# Patient Record
Sex: Female | Born: 1950 | Race: White | Hispanic: No | Marital: Married | Smoking: Never smoker
Health system: Southern US, Community
[De-identification: ages and names within clinical notes are randomized; demographics above are authoritative.]

## PROBLEM LIST (undated history)

## (undated) DIAGNOSIS — Z9889 Other specified postprocedural states: Secondary | ICD-10-CM

## (undated) DIAGNOSIS — I890 Lymphedema, not elsewhere classified: Secondary | ICD-10-CM

## (undated) DIAGNOSIS — E119 Type 2 diabetes mellitus without complications: Secondary | ICD-10-CM

## (undated) DIAGNOSIS — I639 Cerebral infarction, unspecified: Secondary | ICD-10-CM

## (undated) DIAGNOSIS — R112 Nausea with vomiting, unspecified: Secondary | ICD-10-CM

## (undated) DIAGNOSIS — L409 Psoriasis, unspecified: Secondary | ICD-10-CM

## (undated) DIAGNOSIS — I8390 Asymptomatic varicose veins of unspecified lower extremity: Secondary | ICD-10-CM

## (undated) DIAGNOSIS — C50911 Malignant neoplasm of unspecified site of right female breast: Secondary | ICD-10-CM

## (undated) DIAGNOSIS — Z923 Personal history of irradiation: Secondary | ICD-10-CM

## (undated) DIAGNOSIS — C801 Malignant (primary) neoplasm, unspecified: Secondary | ICD-10-CM

## (undated) DIAGNOSIS — I10 Essential (primary) hypertension: Secondary | ICD-10-CM

## (undated) HISTORY — DX: Lymphedema, not elsewhere classified: I89.0

## (undated) HISTORY — DX: Cerebral infarction, unspecified: I63.9

## (undated) HISTORY — PX: REDUCTION MAMMAPLASTY: SUR839

## (undated) HISTORY — DX: Malignant neoplasm of unspecified site of right female breast: C50.911

## (undated) HISTORY — DX: Type 2 diabetes mellitus without complications: E11.9

## (undated) HISTORY — DX: Asymptomatic varicose veins of unspecified lower extremity: I83.90

## (undated) HISTORY — DX: Malignant (primary) neoplasm, unspecified: C80.1

---

## 1975-11-09 HISTORY — PX: BREAST REDUCTION SURGERY: SHX8

## 1988-11-08 DIAGNOSIS — L409 Psoriasis, unspecified: Secondary | ICD-10-CM

## 1988-11-08 HISTORY — DX: Psoriasis, unspecified: L40.9

## 1998-11-08 DIAGNOSIS — I639 Cerebral infarction, unspecified: Secondary | ICD-10-CM

## 1998-11-08 HISTORY — DX: Cerebral infarction, unspecified: I63.9

## 2004-09-16 ENCOUNTER — Ambulatory Visit: Payer: Self-pay | Admitting: Family Medicine

## 2005-08-31 ENCOUNTER — Ambulatory Visit: Payer: Self-pay | Admitting: Nurse Practitioner

## 2006-09-26 ENCOUNTER — Ambulatory Visit: Payer: Self-pay | Admitting: Nurse Practitioner

## 2007-12-14 ENCOUNTER — Ambulatory Visit: Payer: Self-pay | Admitting: Family Medicine

## 2008-11-08 HISTORY — PX: DILATION AND CURETTAGE OF UTERUS: SHX78

## 2009-10-09 ENCOUNTER — Emergency Department: Payer: Self-pay | Admitting: Emergency Medicine

## 2010-04-28 ENCOUNTER — Ambulatory Visit: Payer: Self-pay | Admitting: Family Medicine

## 2011-11-03 ENCOUNTER — Ambulatory Visit: Payer: Self-pay | Admitting: Family Medicine

## 2011-11-03 IMAGING — MG MM DIGITAL SCREENING BILAT W/ CAD
1 series · 4 of 4 positions shown · non-contrast
Comparison: none

REASON FOR EXAM: HATTEN scr mammo no order
COMMENTS:

[R CC · right · 4 of 4 slices shown]
[im 1/4]
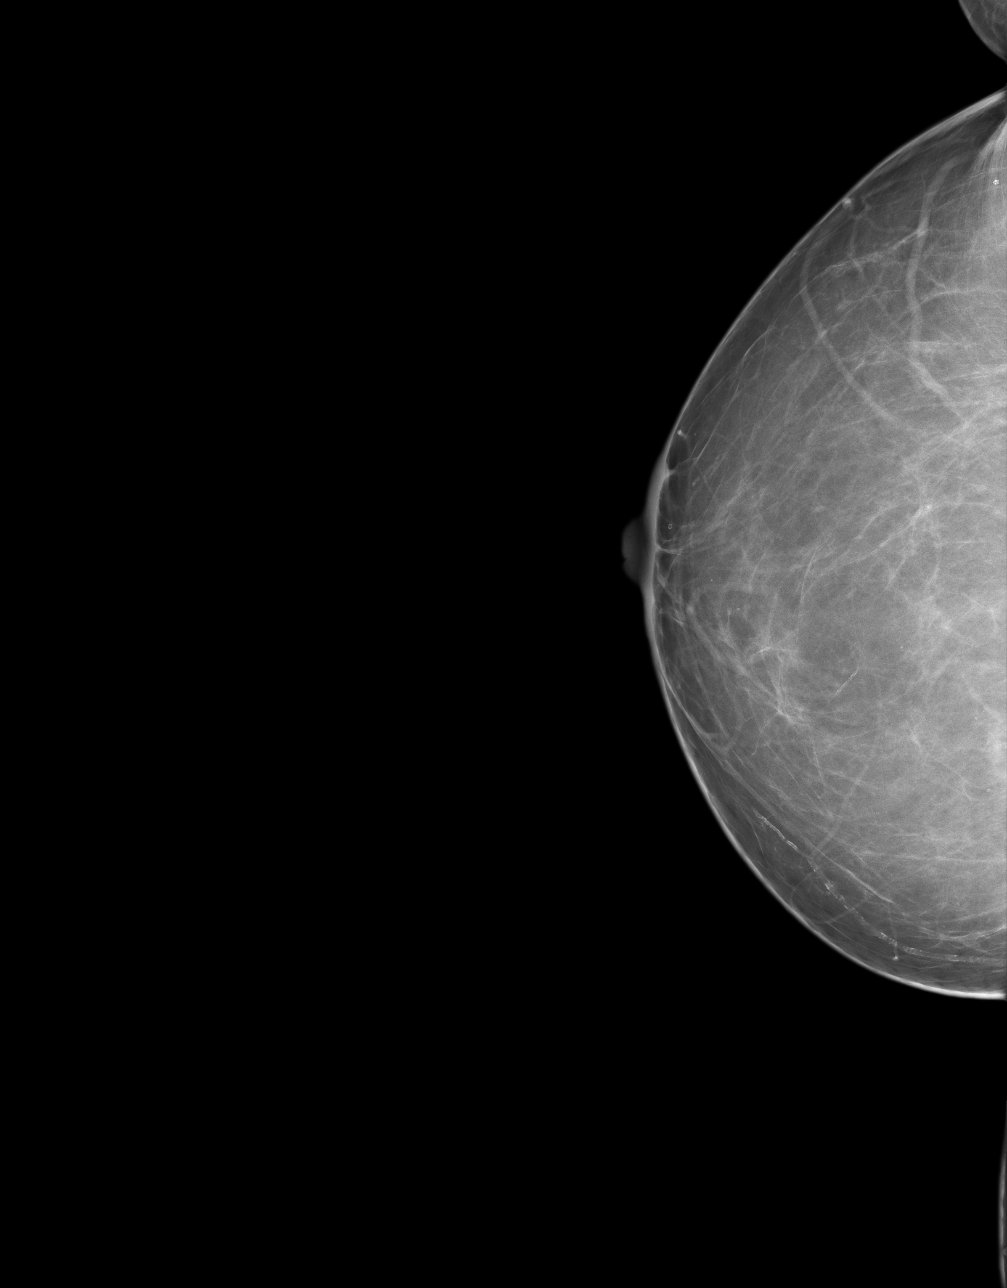
[im 2/4]
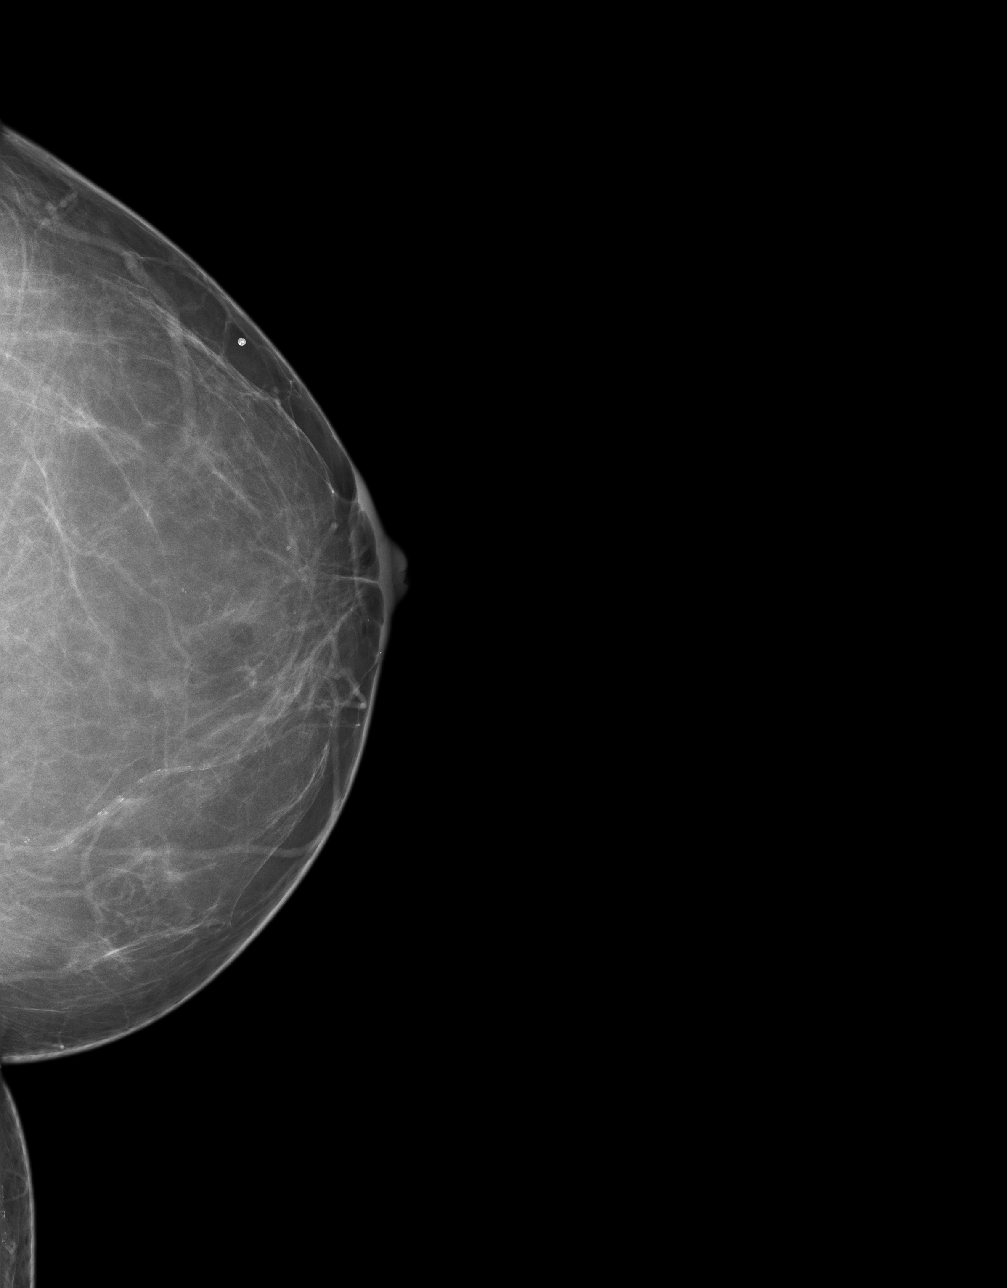
[im 3/4]
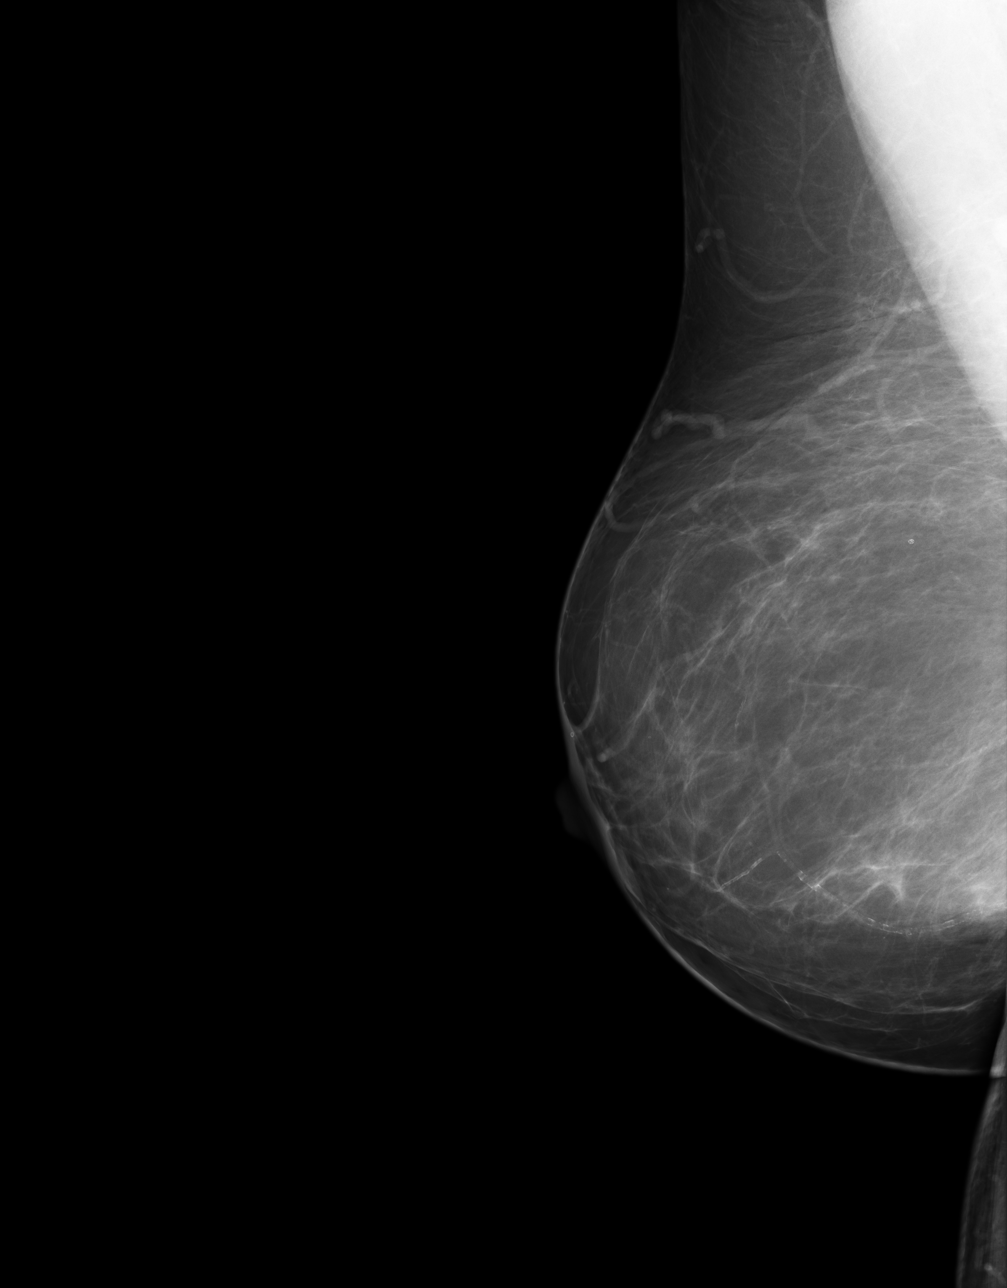
[im 4/4]
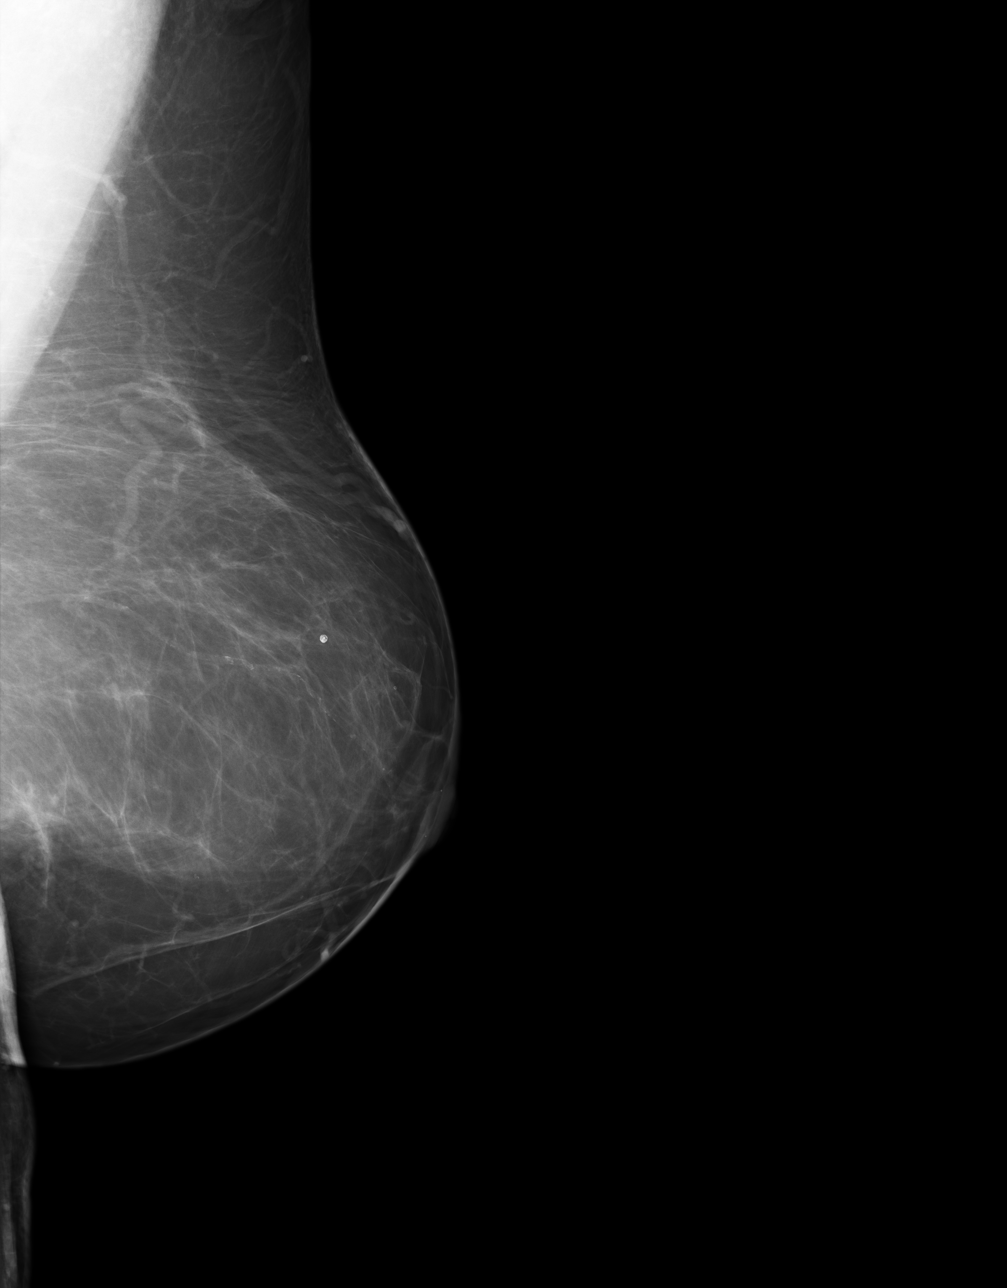

[4 of 4 positions shown; findings below may reference images not displayed]

PROCEDURE:     MAM - MAM HATTEN DGT SCR W/CAD NO ORDER  - [DATE]  [DATE]

RESULT:       Comparison is made to a study of [DATE], [DATE] and
[DATE]. A film screen study of [DATE] is reviewed as well.

The breasts exhibit an involutional pattern with only a small amount of
residual dense parenchymal tissue.  There is no dominant mass.  There are
vascular calcifications bilaterally and a coarse macrocalcification is
stable on the left in the upper outer quadrant.  I see no
malignant-appearing grouping of microcalcification and no area of new
architectural distortion.
IMPRESSION: 1.     I do not see findings suspicious for malignancy.
2.     BI-RADS 2:  Benign Findings.

RECOMMENTATION:   Please continue to encourage yearly mammographic follow
up.

A NEGATIVE MAMMOGRAM REPORT DOES NOT PRECLUDE BIOPSY OR OTHER EVALUATION OF
A CLINICALLY PALPABLE OR OTHERWISE SUSPICOUS MASS OR LESION.  BREAST CANCER
MAY NOT BE DETECTED BY MAMMOGRAPHY IN UP TO 10% OF CASES.

## 2011-11-09 HISTORY — PX: EYE SURGERY: SHX253

## 2012-01-05 ENCOUNTER — Emergency Department: Payer: Self-pay | Admitting: Unknown Physician Specialty

## 2012-01-05 LAB — CBC
HCT: 42.8 % (ref 35.0–47.0)
HGB: 14.1 g/dL (ref 12.0–16.0)
MCH: 28.9 pg (ref 26.0–34.0)
MCV: 88 fL (ref 80–100)
RBC: 4.9 10*6/uL (ref 3.80–5.20)
WBC: 10.1 10*3/uL (ref 3.6–11.0)

## 2012-01-05 LAB — COMPREHENSIVE METABOLIC PANEL
Albumin: 3.6 g/dL (ref 3.4–5.0)
Anion Gap: 6 — ABNORMAL LOW (ref 7–16)
BUN: 22 mg/dL — ABNORMAL HIGH (ref 7–18)
Chloride: 99 mmol/L (ref 98–107)
EGFR (African American): >60
EGFR (Non-African Amer.): >60
Glucose: 172 mg/dL — ABNORMAL HIGH (ref 65–99)
SGOT(AST): 13 U/L — ABNORMAL LOW (ref 15–37)
SGPT (ALT): 18 U/L
Sodium: 138 mmol/L (ref 136–145)
Total Protein: 7.9 g/dL (ref 6.4–8.2)

## 2012-01-05 LAB — URINALYSIS, COMPLETE
Bilirubin,UR: NEGATIVE
Glucose,UR: 150 mg/dL (ref 0–75)
Ketone: NEGATIVE
Leukocyte Esterase: NEGATIVE
Nitrite: POSITIVE
Ph: 7 (ref 4.5–8.0)
Protein: 100
RBC,UR: 3 /HPF (ref 0–5)
WBC UR: 8 /HPF (ref 0–5)

## 2012-01-05 LAB — TROPONIN I: Troponin-I: <0.02 ng/mL

## 2012-01-05 LAB — LIPASE, BLOOD: Lipase: 90 U/L (ref 73–393)

## 2012-05-30 ENCOUNTER — Ambulatory Visit: Payer: Self-pay | Admitting: Ophthalmology

## 2012-06-13 ENCOUNTER — Ambulatory Visit: Payer: Self-pay | Admitting: Family Medicine

## 2012-06-13 IMAGING — CR DG FOOT COMPLETE 3+V*L*
1 series · 4 of 4 positions shown · non-contrast
Comparison: none

REASON FOR EXAM: Diabetic foot ulcer
COMMENTS:

PROCEDURE:     DXR - DXR FOOT LT COMP W/OBLIQUES  - [DATE] [DATE]
RESULT:     Comparison: None.

[Series 1: ap · 0.17mm/px · 4 of 4 slices shown]
[im 1/4]
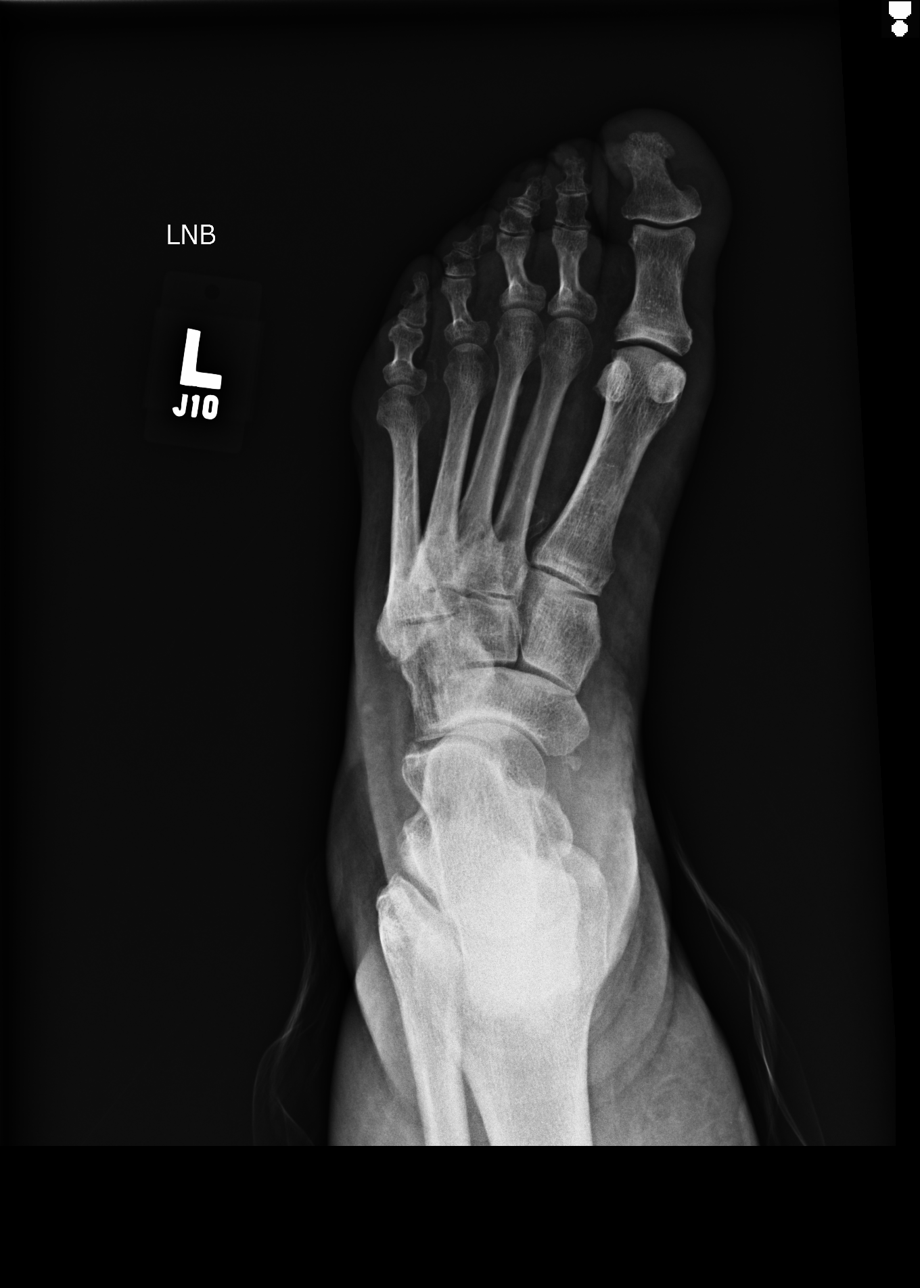
[im 2/4]
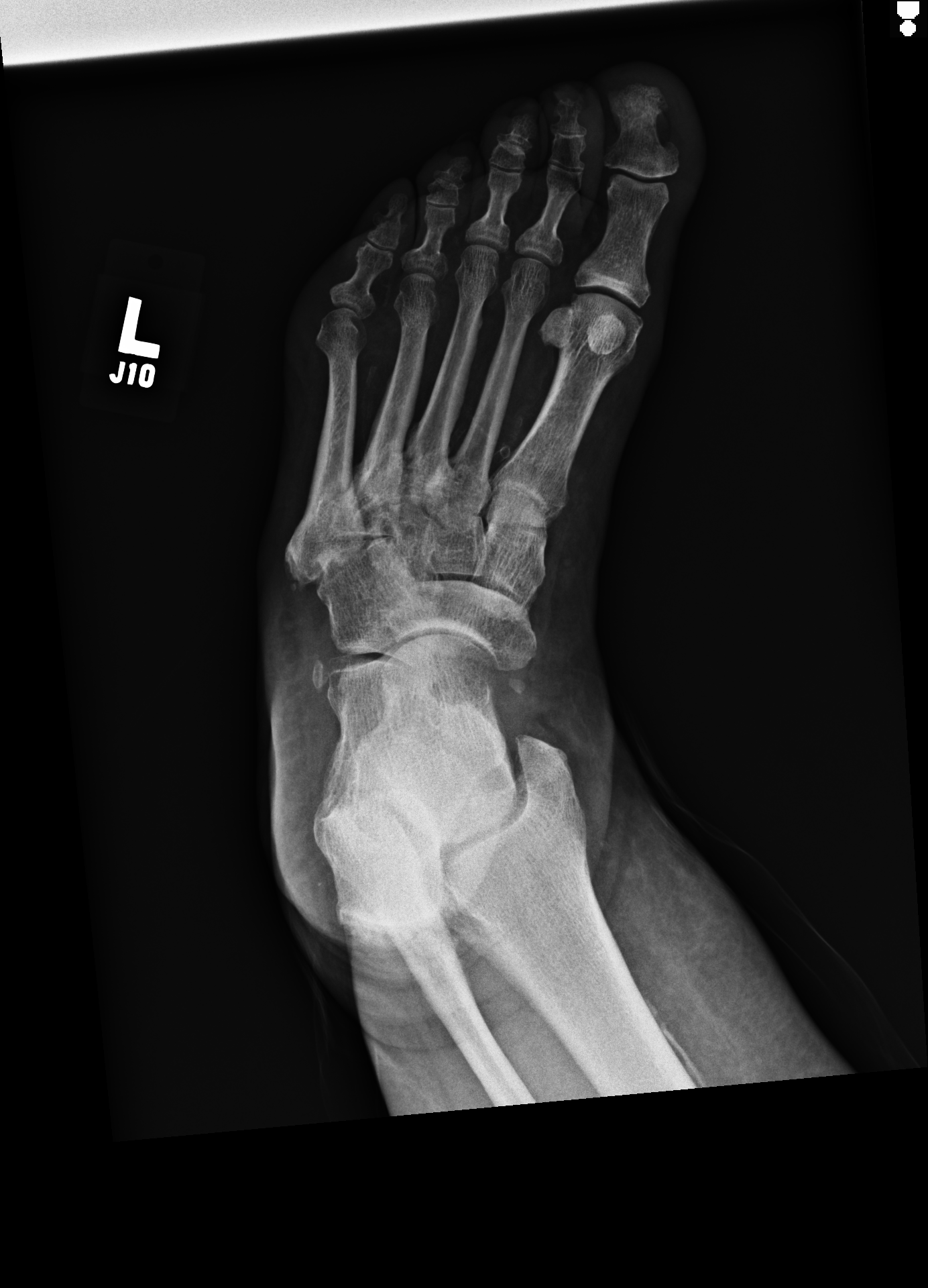
[im 3/4]
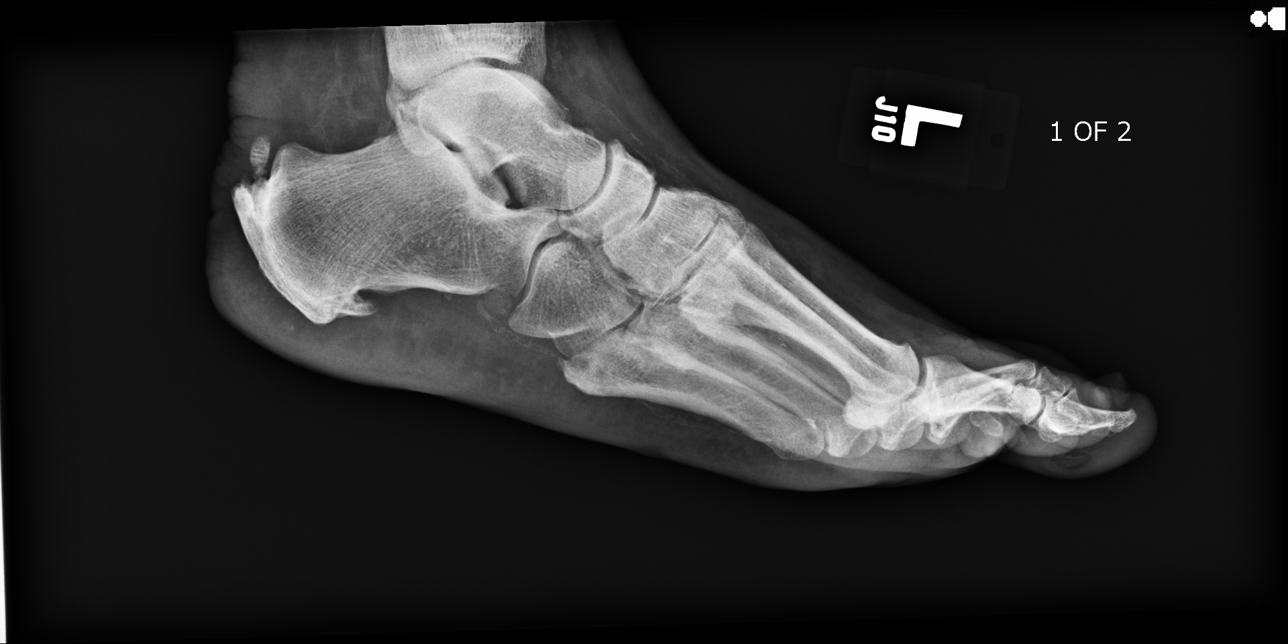
[im 4/4]
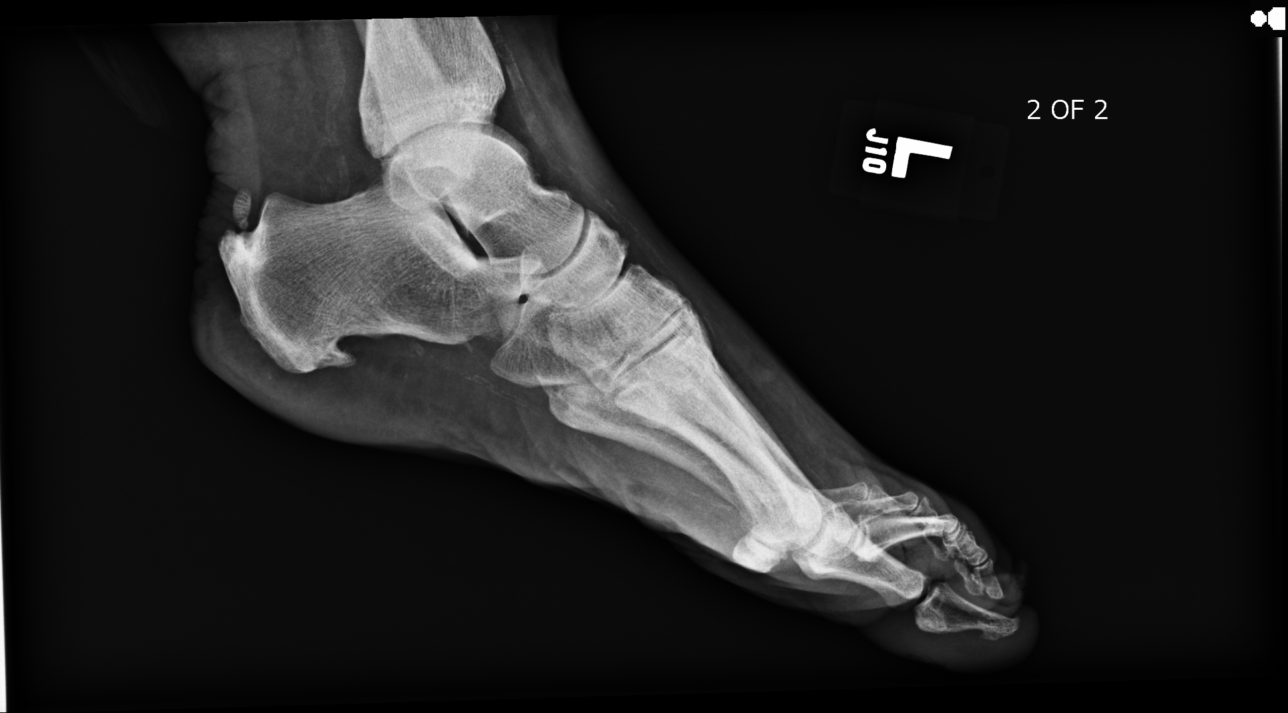

[4 of 4 positions shown; findings below may reference images not displayed]

FINDINGS: No acute fracture. No periostitis or cortical thickening. There are mild
degenerative changes in the midfoot. Vascular calcifications are present.
There is a small density with surrounding lucency along the plantar soft
tissues of the great toe which is of uncertain etiology.
IMPRESSION: 1. Small soft tissue density with surrounding lucency in the plantar soft
tissues of the great toe is of uncertain etiology. This may be related to a
toe ulcer. Correlate for foreign body.
2. No radiographic evidence of osteomyelitis. If there is continued clinical
concern, further evaluation could be provided with MRI.

[REDACTED]

## 2012-06-19 ENCOUNTER — Encounter: Payer: Self-pay | Admitting: Nurse Practitioner

## 2012-06-19 ENCOUNTER — Encounter: Payer: Self-pay | Admitting: Cardiothoracic Surgery

## 2012-07-09 ENCOUNTER — Encounter: Payer: Self-pay | Admitting: Cardiothoracic Surgery

## 2012-07-09 ENCOUNTER — Encounter: Payer: Self-pay | Admitting: Nurse Practitioner

## 2012-08-08 ENCOUNTER — Encounter: Payer: Self-pay | Admitting: Nurse Practitioner

## 2012-08-08 ENCOUNTER — Encounter: Payer: Self-pay | Admitting: Cardiothoracic Surgery

## 2012-09-08 ENCOUNTER — Encounter: Payer: Self-pay | Admitting: Cardiothoracic Surgery

## 2012-09-08 ENCOUNTER — Encounter: Payer: Self-pay | Admitting: Nurse Practitioner

## 2012-10-08 ENCOUNTER — Encounter: Payer: Self-pay | Admitting: Cardiothoracic Surgery

## 2012-10-08 ENCOUNTER — Encounter: Payer: Self-pay | Admitting: Nurse Practitioner

## 2012-11-08 ENCOUNTER — Encounter: Payer: Self-pay | Admitting: Cardiothoracic Surgery

## 2012-11-08 ENCOUNTER — Encounter: Payer: Self-pay | Admitting: Nurse Practitioner

## 2013-01-18 ENCOUNTER — Ambulatory Visit: Payer: Self-pay | Admitting: Family Medicine

## 2013-01-18 IMAGING — MG MM CAD SCREENING MAMMO
1 series · 6 of 6 positions shown · non-contrast
Comparison: none

REASON FOR EXAM: scr mammo no order
COMMENTS:

[R CC · right · 6 of 6 slices shown]
[im 1/6]
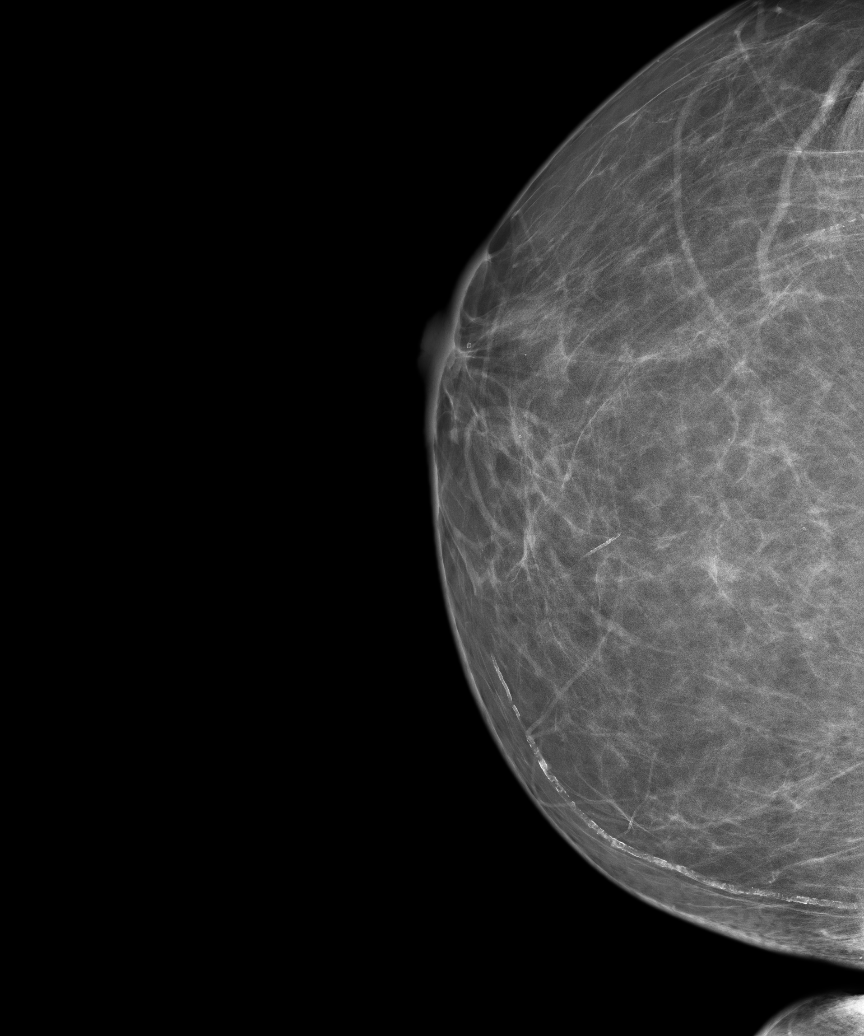
[im 2/6]
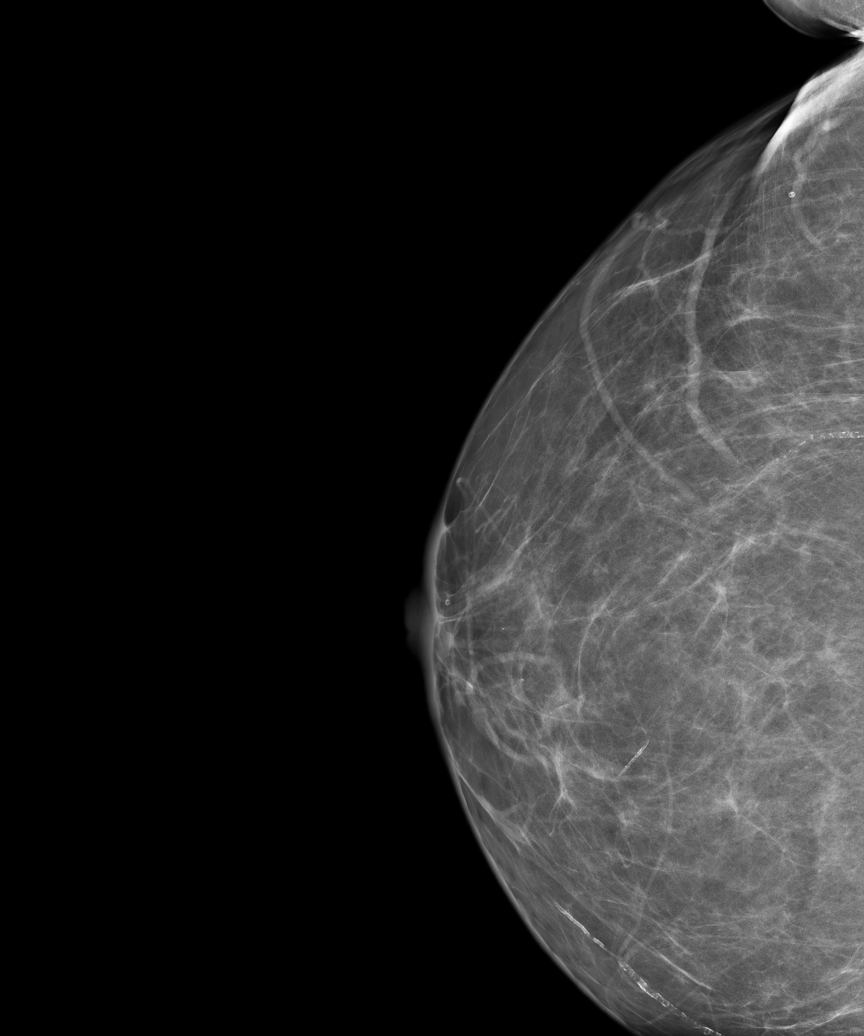
[im 3/6]
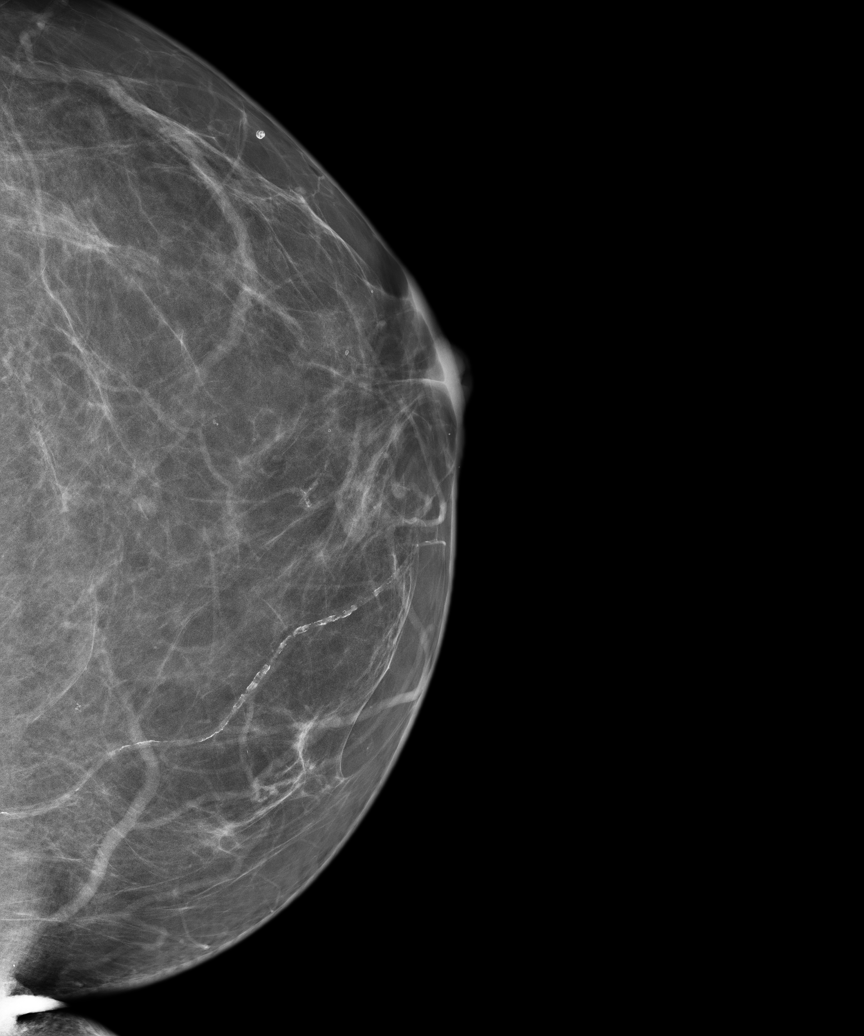
[im 4/6]
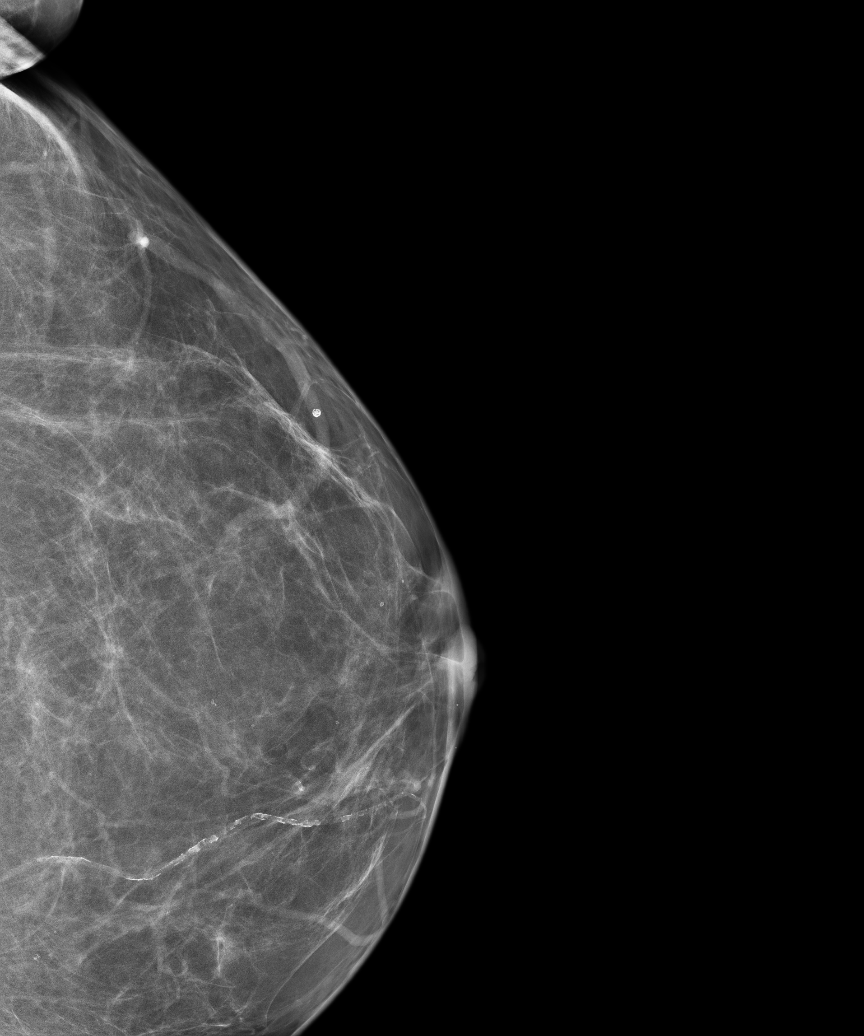
[im 5/6]
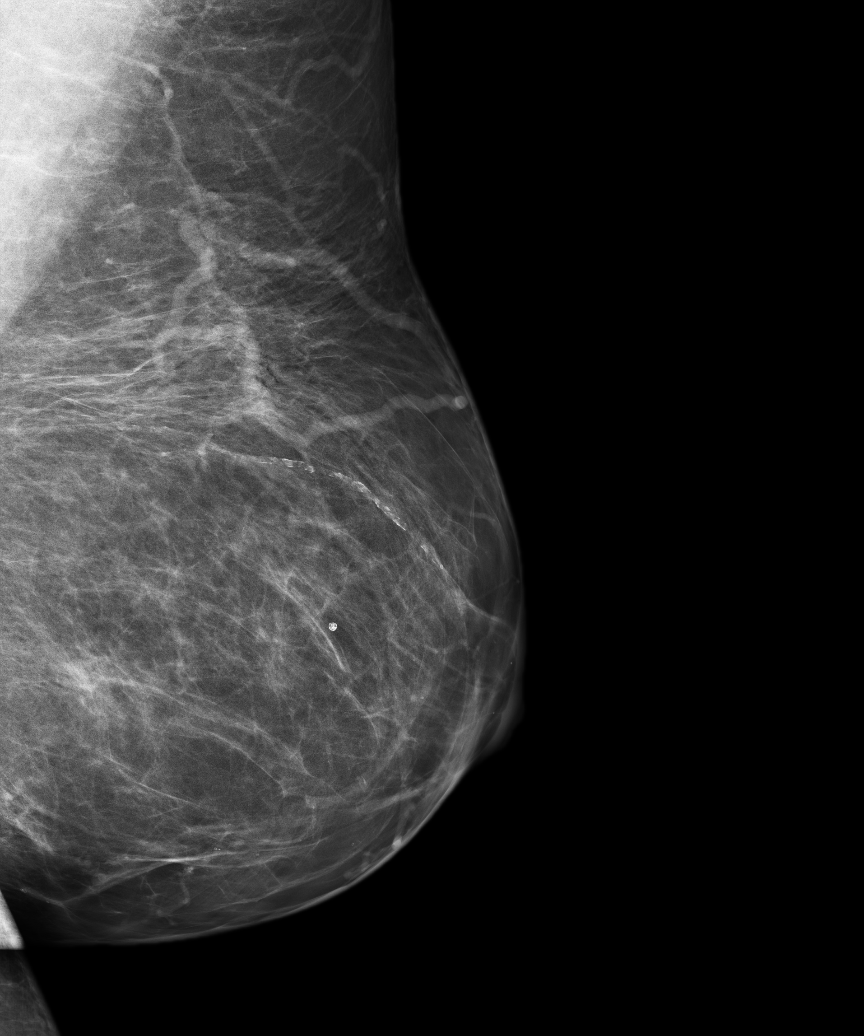
[im 6/6]
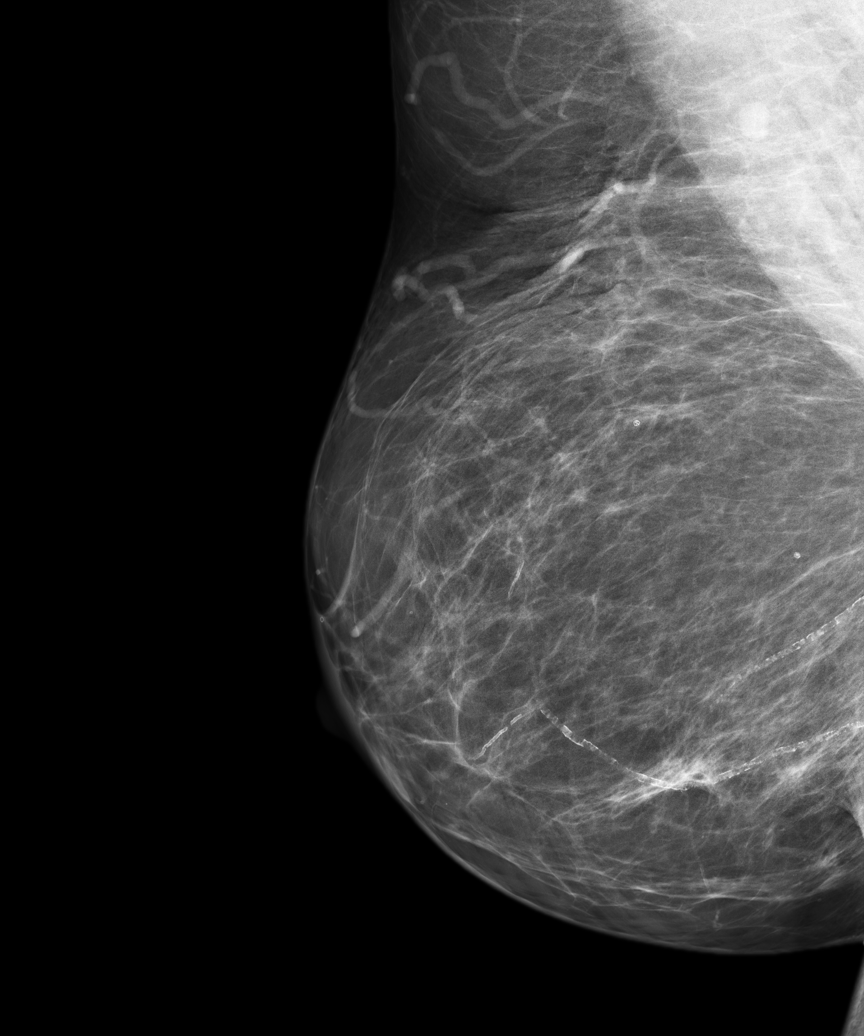

[6 of 6 positions shown; findings below may reference images not displayed]

PROCEDURE:     MAM - MAM DGTL SCRN MAM NO ORDER W/CAD  - [DATE]  [DATE]

RESULT:     There is a history of breast cancer in the patient's mother and
sister. The patient denies previous breast surgery or breast cancer history.
Comparison is made to digital images from [DATE] as well as [DATE] and [REDACTED]. There is scattered fibroglandular density
in both breasts without an area of increasing or dominant mass or developing
density. Stable benign appearing calcifications are present.
IMPRESSION: Stable, benign appearing bilateral mammogram. Please
continue to encourage annual mammographic followup and monthly breast self
exam. BREAST COMPOSITION: The breast composition is SCATTERED FIBROGLANDULAR
TISSUE (glandular tissue is 25-50%) BI-RADS: Category 2- Benign Finding

A NEGATIVE MAMMOGRAM REPORT DOES NOT PRECLUDE BIOPSY OR OTHER EVALUATION OF
A CLINICALLY PALPABLE OR OTHERWISE SUSPICIOUS MASS OR LESION. BREAST CANCER
MAY NOT BE DETECTED IN UP TO 10% OF CASES.

[REDACTED]

## 2013-02-19 DIAGNOSIS — J45909 Unspecified asthma, uncomplicated: Secondary | ICD-10-CM | POA: Insufficient documentation

## 2013-02-19 DIAGNOSIS — I639 Cerebral infarction, unspecified: Secondary | ICD-10-CM | POA: Insufficient documentation

## 2013-02-19 DIAGNOSIS — K5730 Diverticulosis of large intestine without perforation or abscess without bleeding: Secondary | ICD-10-CM | POA: Insufficient documentation

## 2013-02-19 DIAGNOSIS — K219 Gastro-esophageal reflux disease without esophagitis: Secondary | ICD-10-CM | POA: Insufficient documentation

## 2013-02-19 DIAGNOSIS — F32A Depression, unspecified: Secondary | ICD-10-CM | POA: Insufficient documentation

## 2013-02-19 DIAGNOSIS — E1169 Type 2 diabetes mellitus with other specified complication: Secondary | ICD-10-CM | POA: Insufficient documentation

## 2013-02-19 DIAGNOSIS — E669 Obesity, unspecified: Secondary | ICD-10-CM | POA: Insufficient documentation

## 2013-02-19 DIAGNOSIS — E785 Hyperlipidemia, unspecified: Secondary | ICD-10-CM | POA: Insufficient documentation

## 2013-02-19 DIAGNOSIS — I10 Essential (primary) hypertension: Secondary | ICD-10-CM | POA: Insufficient documentation

## 2013-02-19 DIAGNOSIS — F329 Major depressive disorder, single episode, unspecified: Secondary | ICD-10-CM | POA: Insufficient documentation

## 2013-02-26 ENCOUNTER — Ambulatory Visit: Payer: Self-pay | Admitting: Ophthalmology

## 2013-02-27 ENCOUNTER — Ambulatory Visit: Payer: Self-pay | Admitting: Ophthalmology

## 2013-02-27 LAB — BASIC METABOLIC PANEL
Anion Gap: 9 (ref 7–16)
Chloride: 109 mmol/L — ABNORMAL HIGH (ref 98–107)
Creatinine: 0.97 mg/dL (ref 0.60–1.30)
EGFR (African American): >60
EGFR (Non-African Amer.): >60

## 2013-02-28 ENCOUNTER — Ambulatory Visit: Payer: Self-pay | Admitting: Ophthalmology

## 2013-06-19 ENCOUNTER — Ambulatory Visit: Payer: Self-pay | Admitting: Ophthalmology

## 2013-06-19 LAB — POTASSIUM: Potassium: 4.4 mmol/L (ref 3.5–5.1)

## 2013-06-27 ENCOUNTER — Ambulatory Visit: Payer: Self-pay | Admitting: Ophthalmology

## 2013-11-08 DIAGNOSIS — Z923 Personal history of irradiation: Secondary | ICD-10-CM

## 2013-11-08 HISTORY — PX: EYE SURGERY: SHX253

## 2013-11-08 HISTORY — PX: BREAST EXCISIONAL BIOPSY: SUR124

## 2013-11-08 HISTORY — DX: Personal history of irradiation: Z92.3

## 2013-12-03 IMAGING — US US ABDOMEN LIMITED
1 series · 14 of 25 positions shown · non-contrast
Comparison: None.

CLINICAL DATA: Morbid obesity.

EXAM:
US ABDOMEN LIMITED - RIGHT UPPER QUADRANT

[Series 1: us abdomen limited · 0.25mm/px · 14 of 49 slices shown]
[im 1/49]
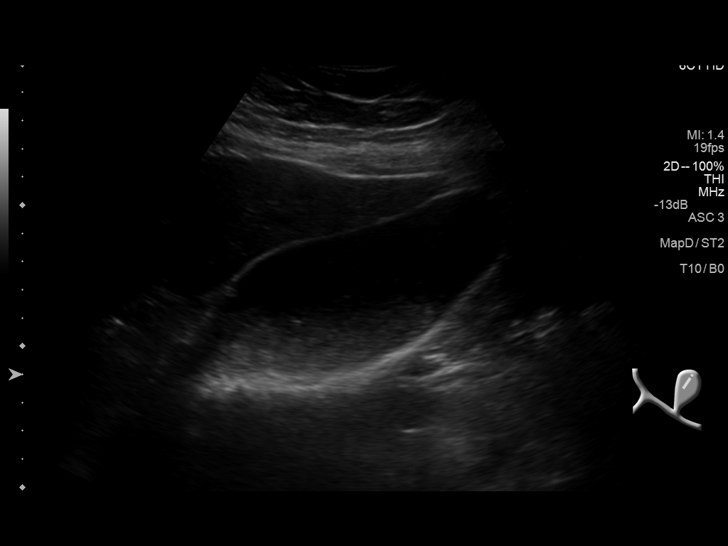
[im 5/49]
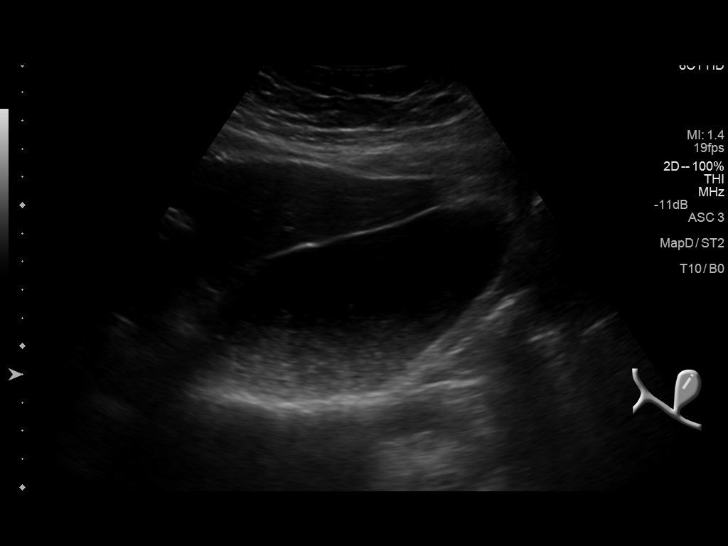
[im 9/49]
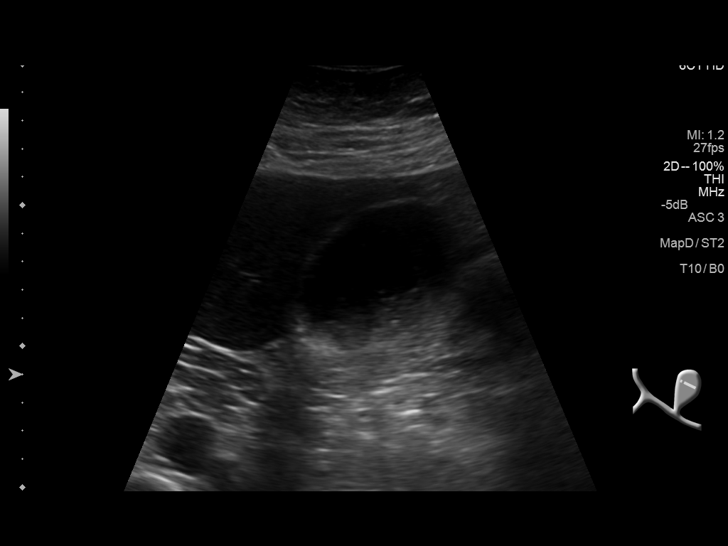
[im 13/49]
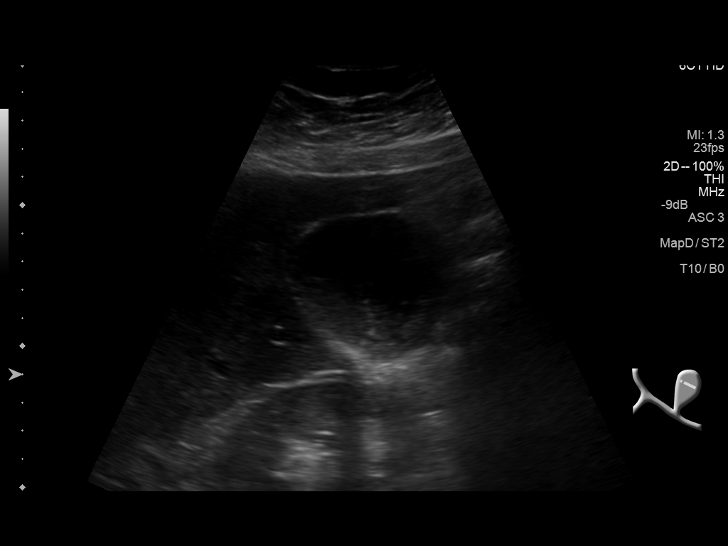
[im 17/49]
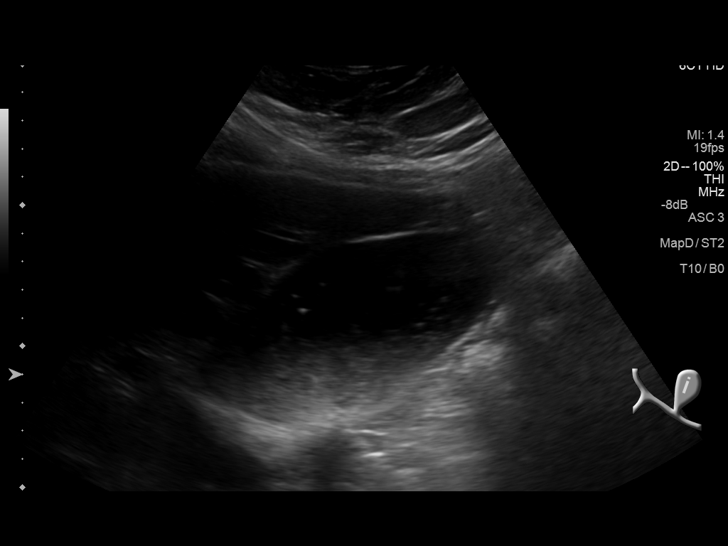
[im 19/49]
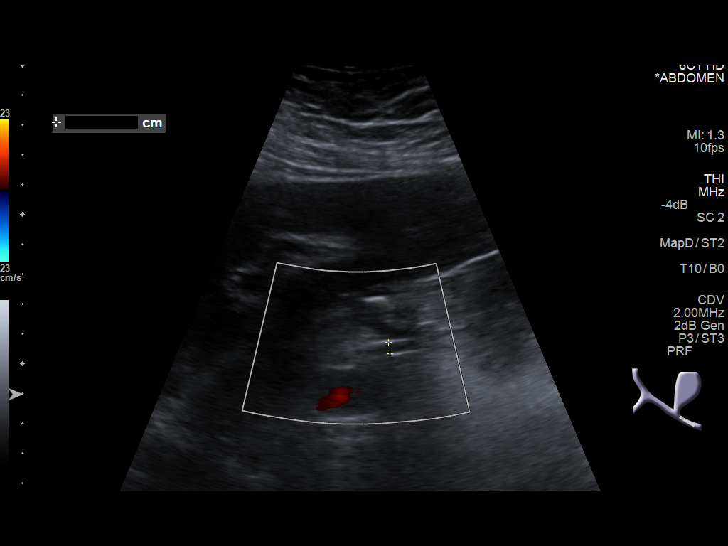
[im 23/49]
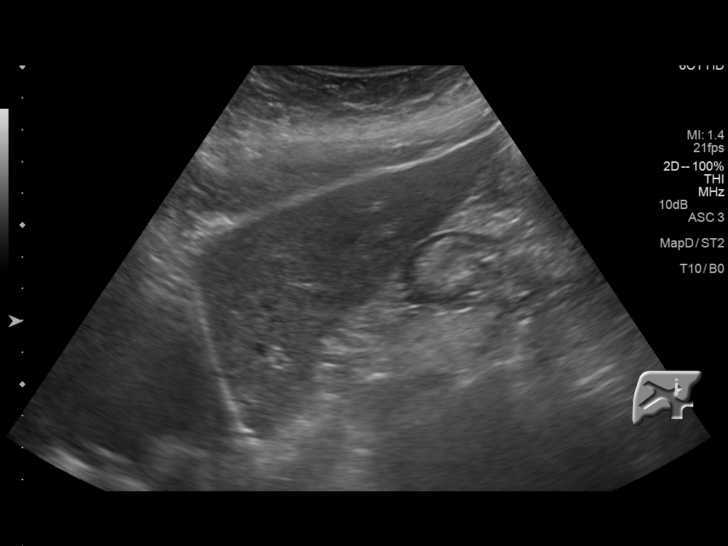
[im 27/49]
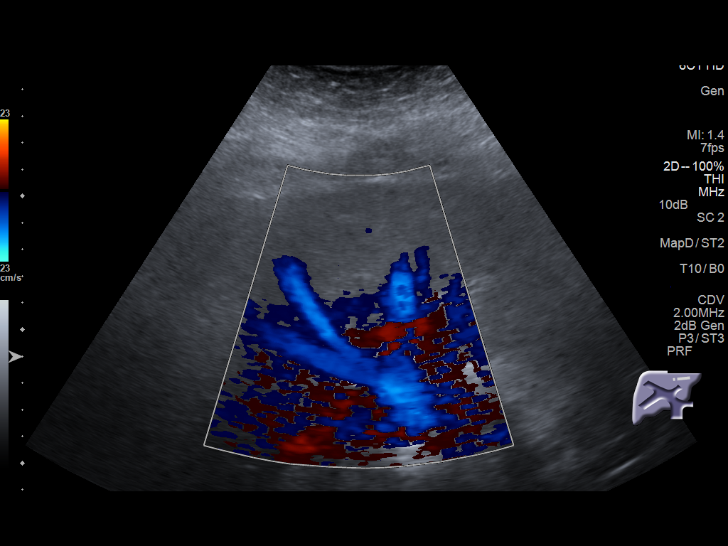
[im 31/49]
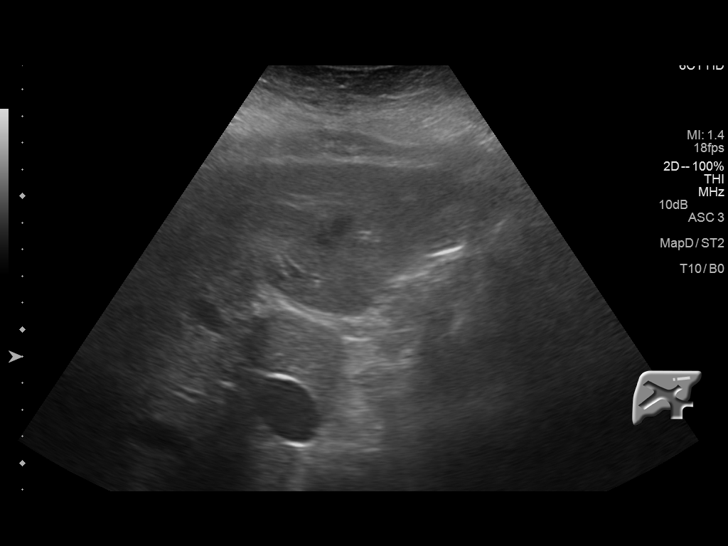
[im 33/49]
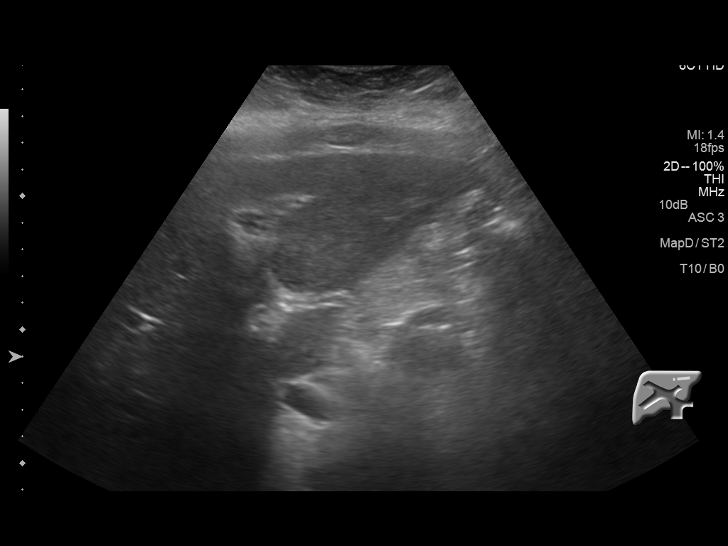
[im 37/49]
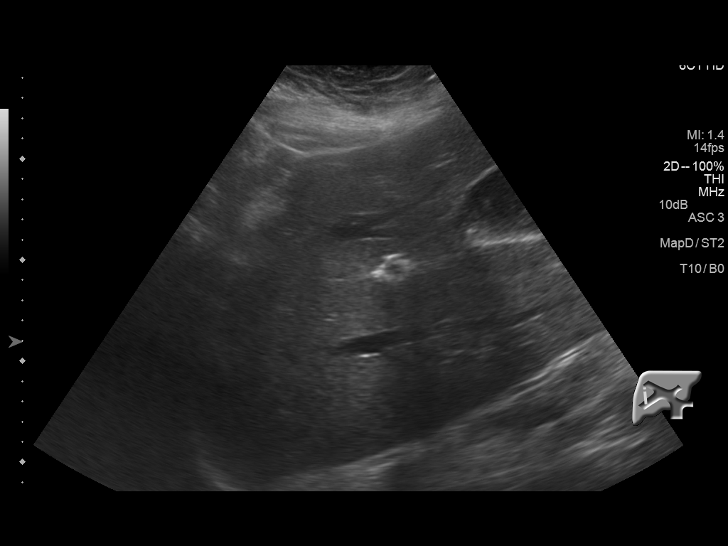
[im 41/49]
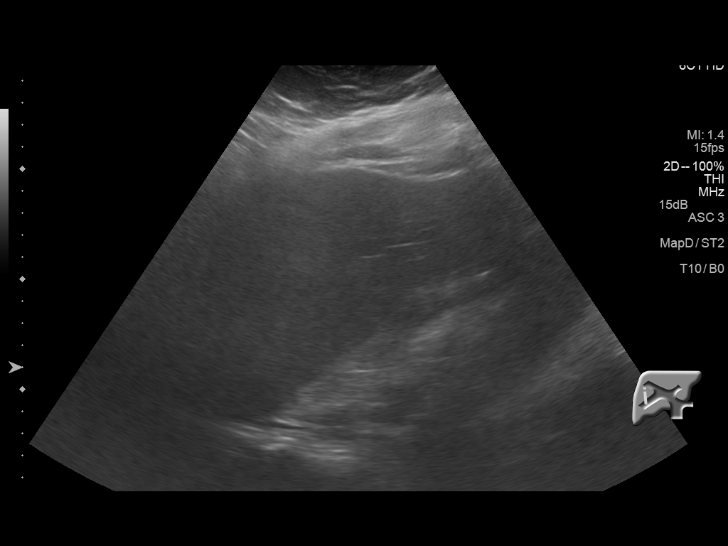
[im 45/49]
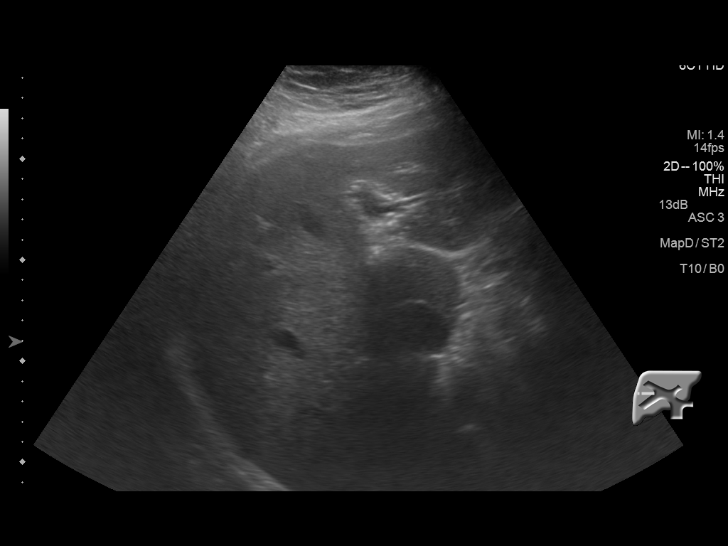
[im 49/49]
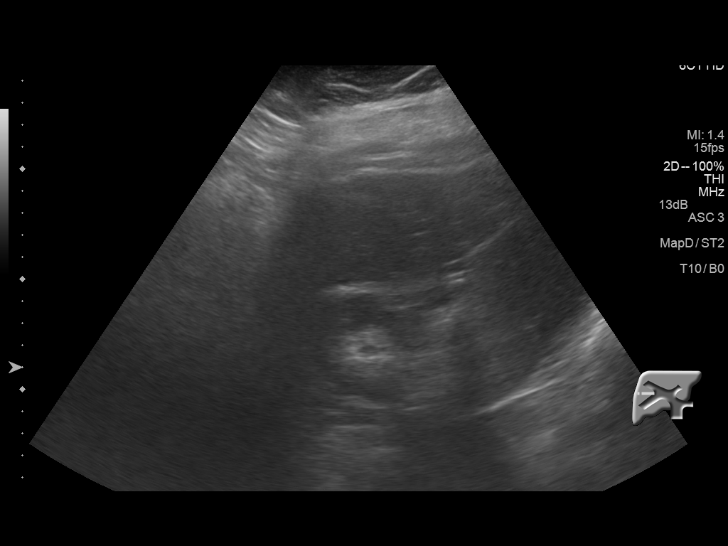

[14 of 25 positions shown; findings below may reference images not displayed]

FINDINGS: Gallbladder:

Prominent sludge noted in the gallbladder. Gallbladder wall
thickness normal at 1.9 mm. Negative Murphy sign.

Common bile duct:

Diameter: 3.8 mm.  Limited visualization due to body habitus.

Liver:

Liver is dense suggesting fatty infiltration.
IMPRESSION: 1. Sludge in the gallbladder.
2. Liver is dense suggesting fatty infiltration.

## 2014-01-04 ENCOUNTER — Emergency Department: Payer: Self-pay | Admitting: Emergency Medicine

## 2014-01-04 LAB — CBC
HCT: 38.6 % (ref 35.0–47.0)
HGB: 12.4 g/dL (ref 12.0–16.0)
MCH: 26.7 pg (ref 26.0–34.0)
MCHC: 32.1 g/dL (ref 32.0–36.0)
MCV: 83 fL (ref 80–100)
PLATELETS: 249 10*3/uL (ref 150–440)
RBC: 4.63 10*6/uL (ref 3.80–5.20)
RDW: 14.3 % (ref 11.5–14.5)
WBC: 10.2 10*3/uL (ref 3.6–11.0)

## 2014-01-04 LAB — BASIC METABOLIC PANEL
Anion Gap: 4 — ABNORMAL LOW (ref 7–16)
BUN: 19 mg/dL — ABNORMAL HIGH (ref 7–18)
CALCIUM: 9.4 mg/dL (ref 8.5–10.1)
CHLORIDE: 103 mmol/L (ref 98–107)
CO2: 33 mmol/L — AB (ref 21–32)
Creatinine: 0.86 mg/dL (ref 0.60–1.30)
EGFR (African American): >60
Glucose: 52 mg/dL — ABNORMAL LOW (ref 65–99)
Osmolality: 279 (ref 275–301)
POTASSIUM: 4.2 mmol/L (ref 3.5–5.1)
SODIUM: 140 mmol/L (ref 136–145)

## 2014-01-04 LAB — TROPONIN I: Troponin-I: <0.02 ng/mL

## 2014-01-04 IMAGING — CR DG CHEST 2V
1 series · 2 of 2 positions shown · non-contrast
Comparison: None.

CLINICAL DATA: Intermittent left chest pain, shortness of breath

EXAM:
CHEST  2 VIEW

[Series 1: pa · 0.17mm/px · 2 of 2 slices shown]
[im 1/2]
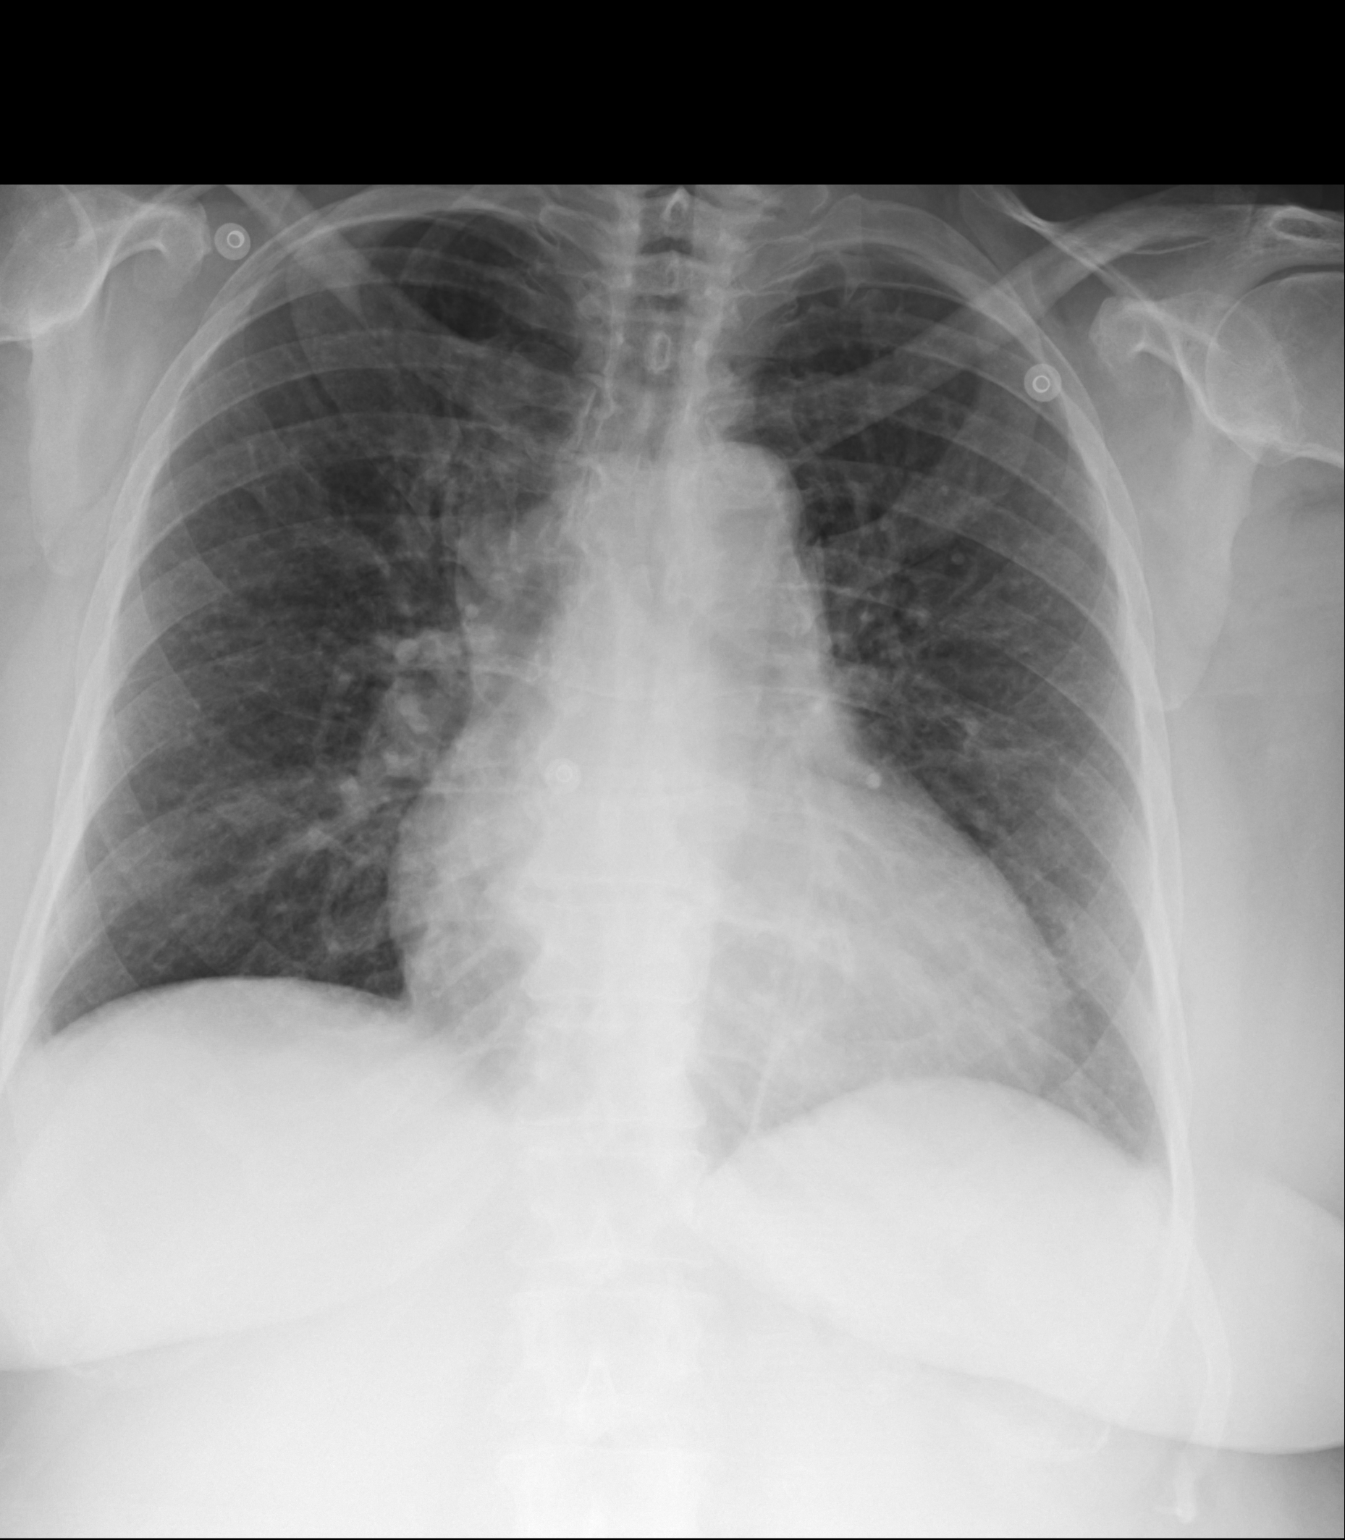
[im 2/2]
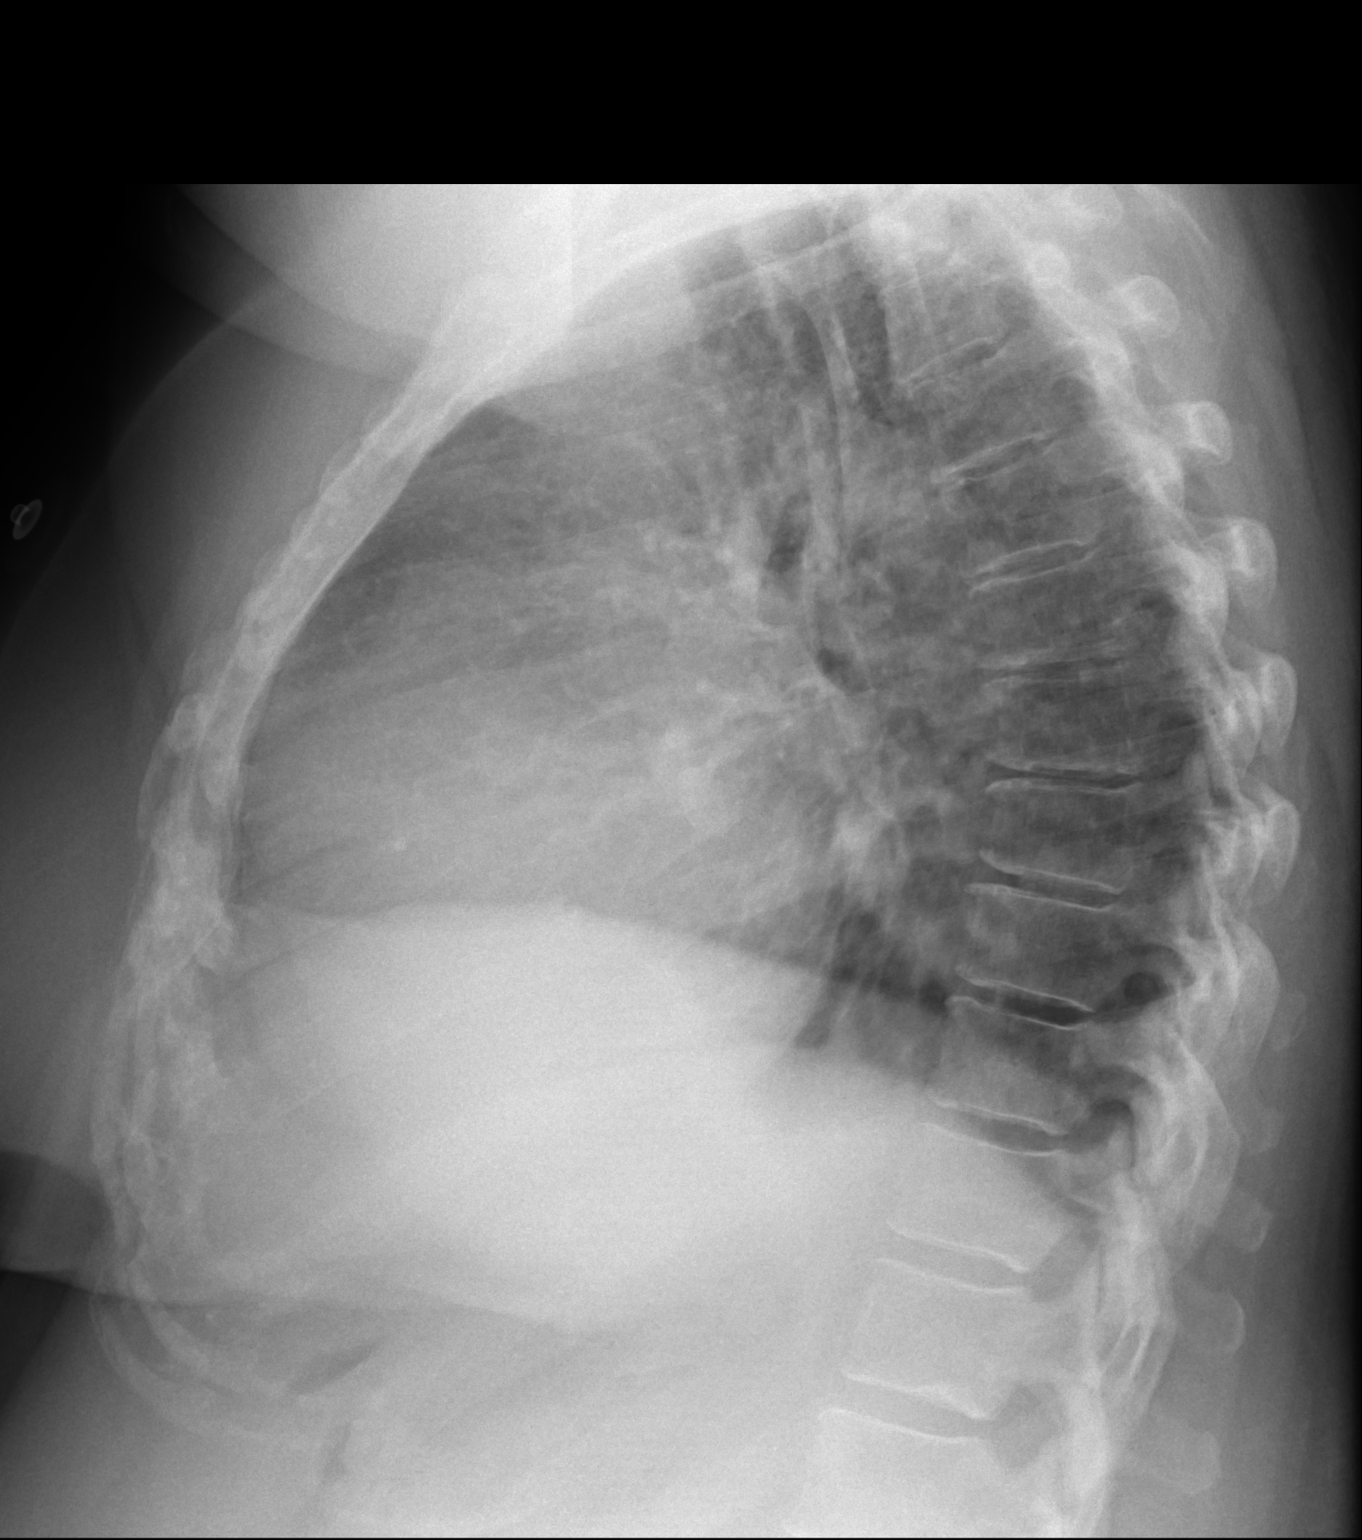

[2 of 2 positions shown; findings below may reference images not displayed]

FINDINGS: Increased interstitial markings of bronchitic changes. No focal
consolidation. No pleural effusion or pneumothorax.

Cardiomegaly.

Mild degenerative changes of the visualized thoracolumbar spine.
IMPRESSION: No evidence of acute cardiopulmonary disease.

## 2014-03-27 ENCOUNTER — Ambulatory Visit: Payer: Self-pay | Admitting: Family Medicine

## 2014-03-27 IMAGING — MG MM DIGITAL SCREENING BILAT W/ CAD
3 series · 6 of 6 positions shown · non-contrast
Comparison: Previous Exam(s)

CLINICAL DATA: Screening.

EXAM:
DIGITAL SCREENING BILATERAL MAMMOGRAM WITH CAD

[R CC · right · 4 of 4 slices shown (1 of 2)]
[im 1/4]
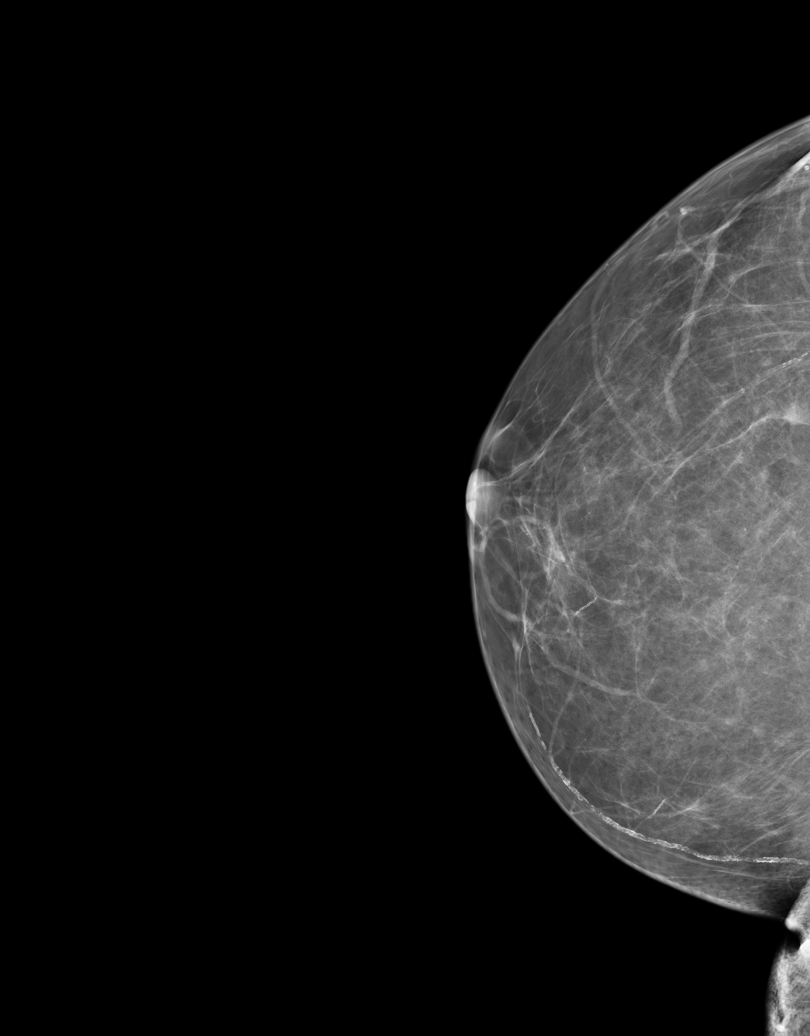
[im 2/4]
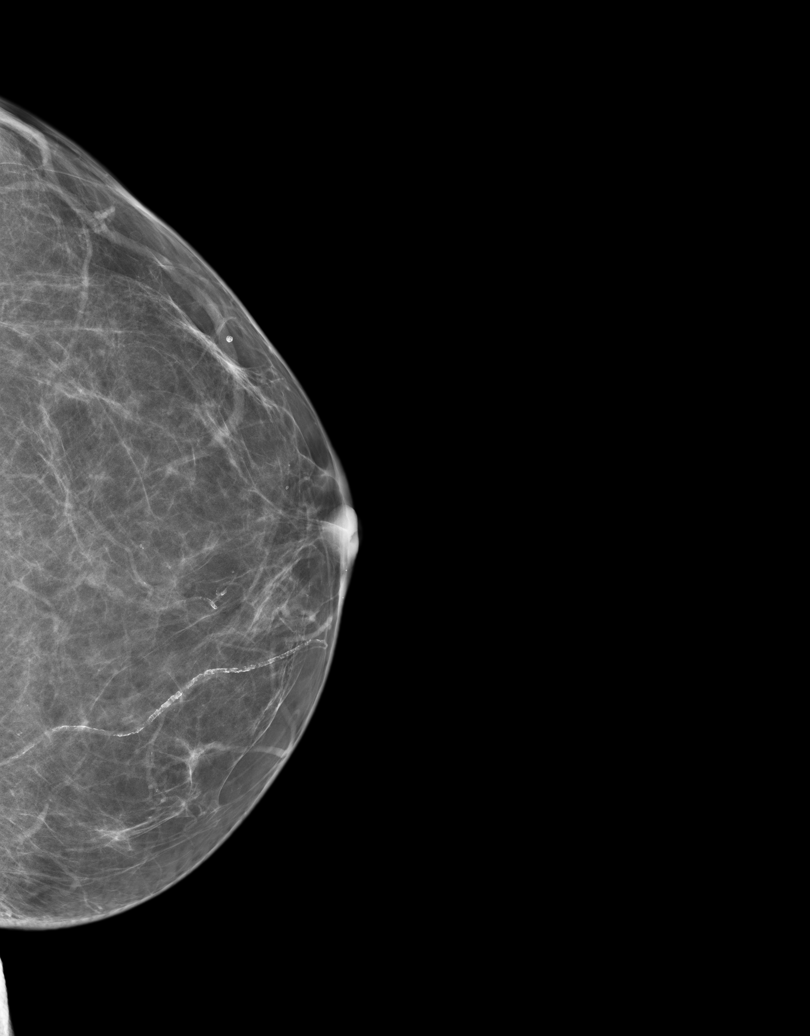
[im 3/4]
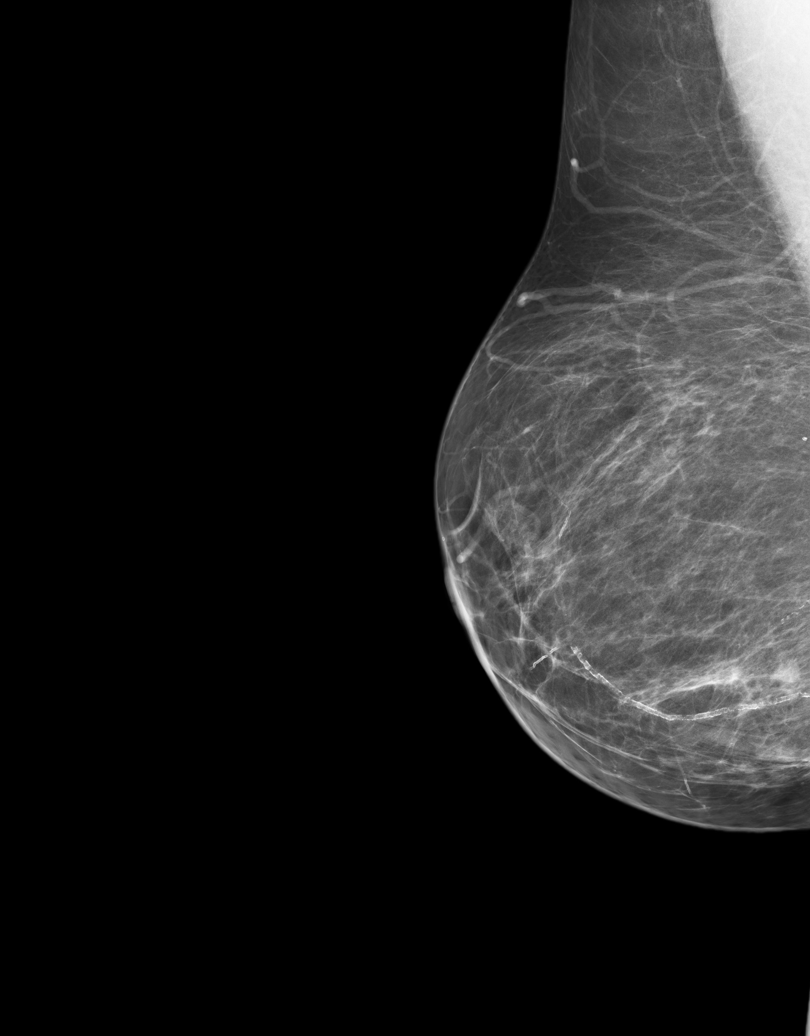
[im 4/4]
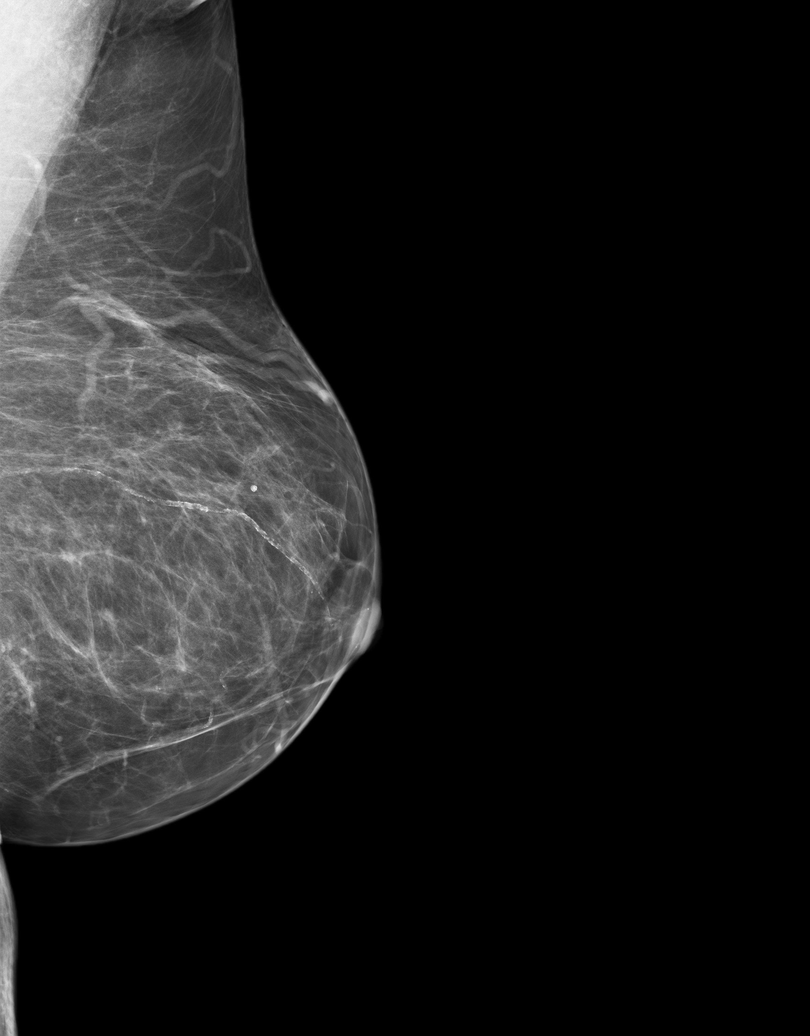

[R CC (2 of 2)]
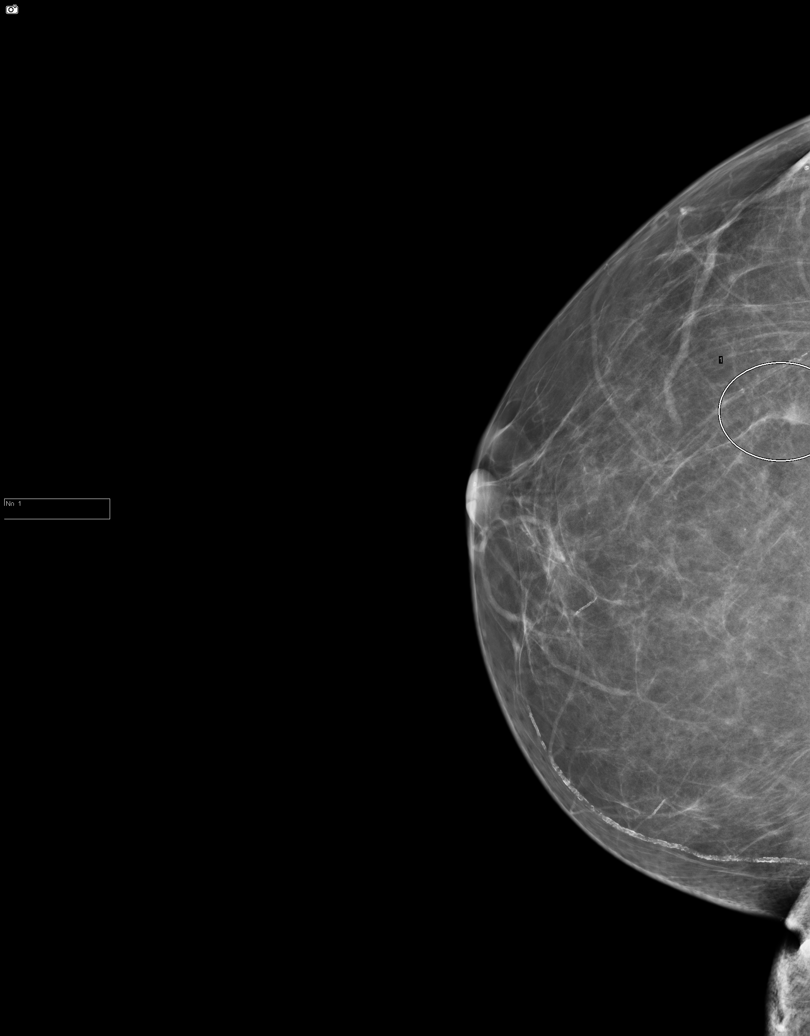

[R MLO]
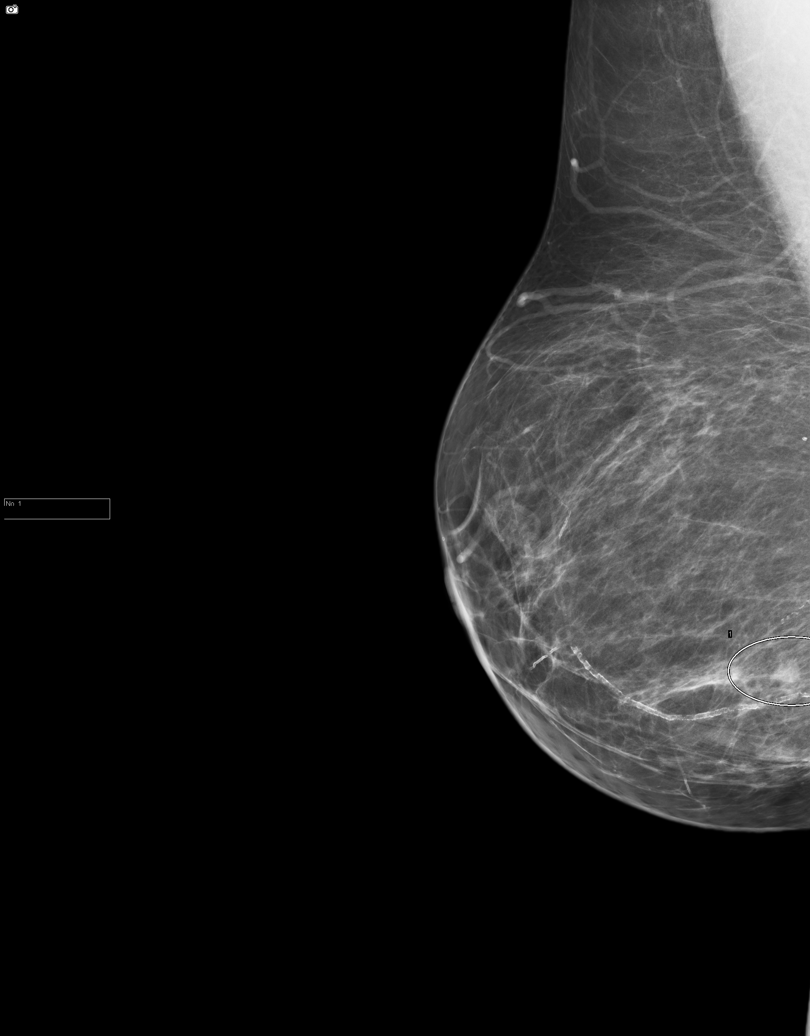

[6 of 6 positions shown; findings below may reference images not displayed]

ACR Breast Density Category b: There are scattered areas of
fibroglandular density.
FINDINGS: In the right breast, a possible asymmetry warrants further
evaluation with spot compression views and possibly ultrasound. In
the left breast, no findings suspicious for malignancy. Images were
processed with CAD.
IMPRESSION: Further evaluation is suggested for possible asymmetry in the right
breast.

RECOMMENDATION:
Diagnostic mammogram and possibly ultrasound of the right breast.
(Code:[NF])

The patient will be contacted regarding the findings, and additional
imaging will be scheduled.

BI-RADS CATEGORY  0: Incomplete. Need additional imaging evaluation
and/or prior mammograms for comparison.

## 2014-04-03 ENCOUNTER — Ambulatory Visit: Payer: Self-pay | Admitting: Family Medicine

## 2014-04-03 IMAGING — MG MM ADDITIONAL VIEWS AT NO CHARGE
2 series · 3 of 3 positions shown · non-contrast
Comparison: [DATE] and prior mammograms dating back to
[DATE]

CLINICAL DATA: 62-year-old female with possible right breast mass
on screening mammogram

EXAM:
DIGITAL DIAGNOSTIC  RIGHT MAMMOGRAM
ULTRASOUND RIGHT BREAST

[R CC · right · 2 of 2 slices shown]
[im 1/2]
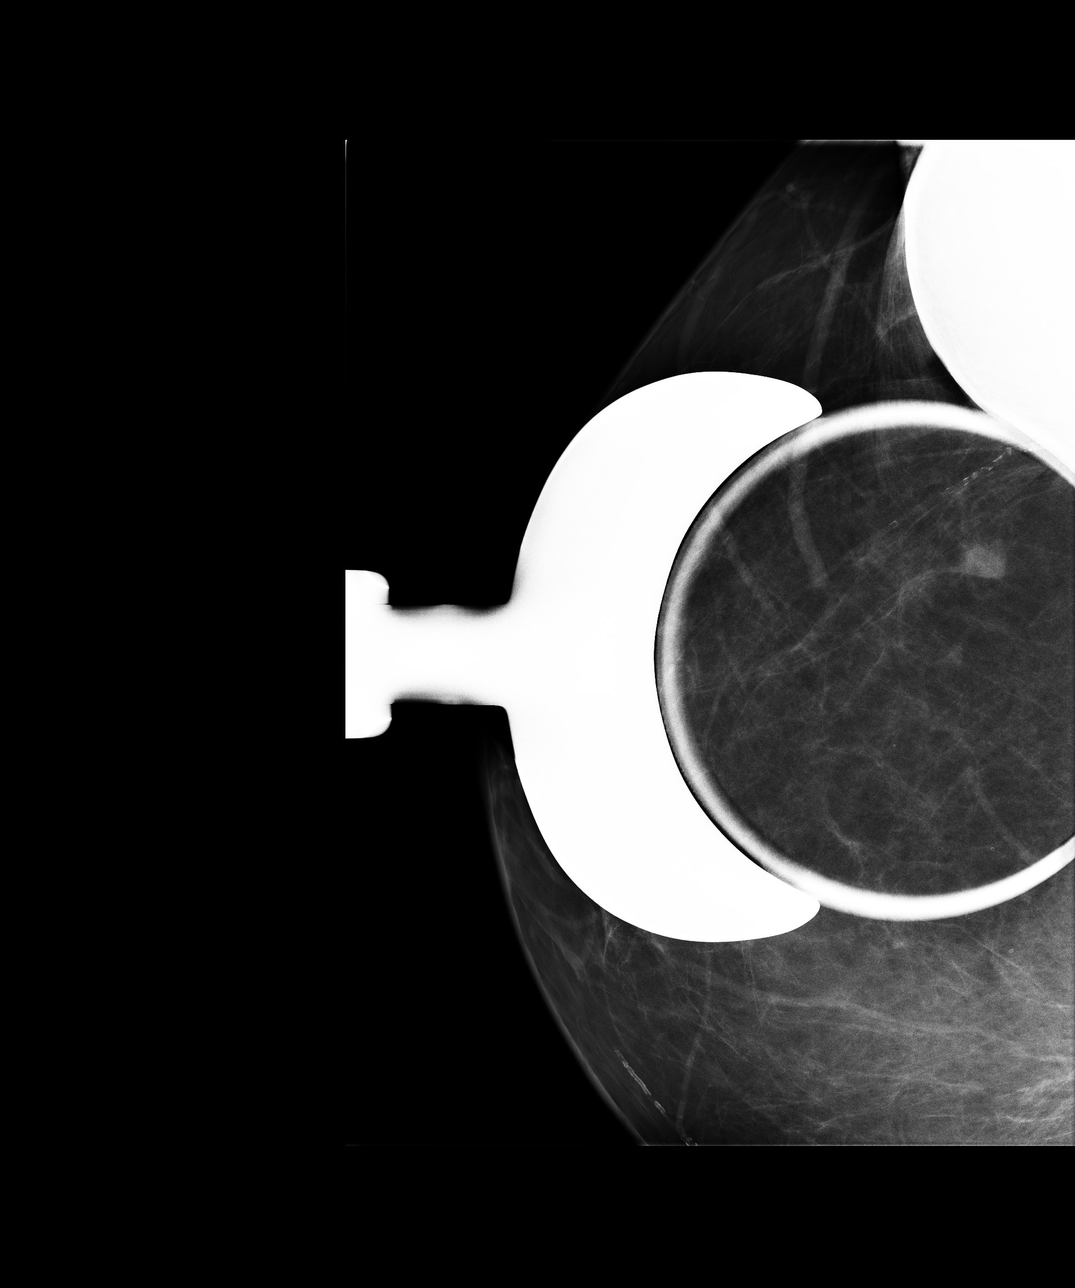
[im 2/2]
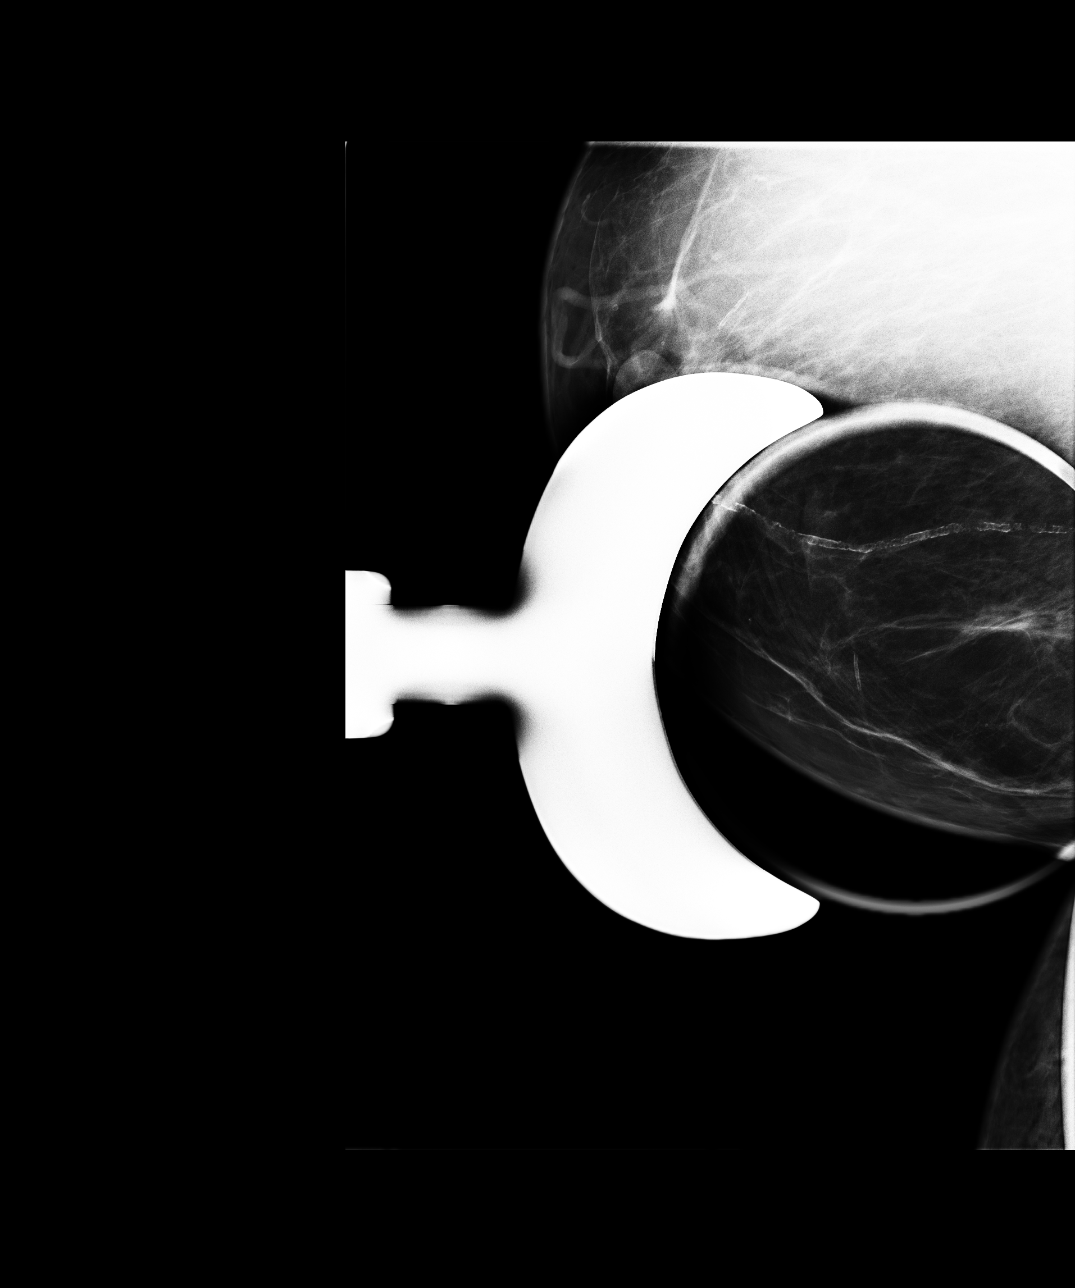

[R MLO]
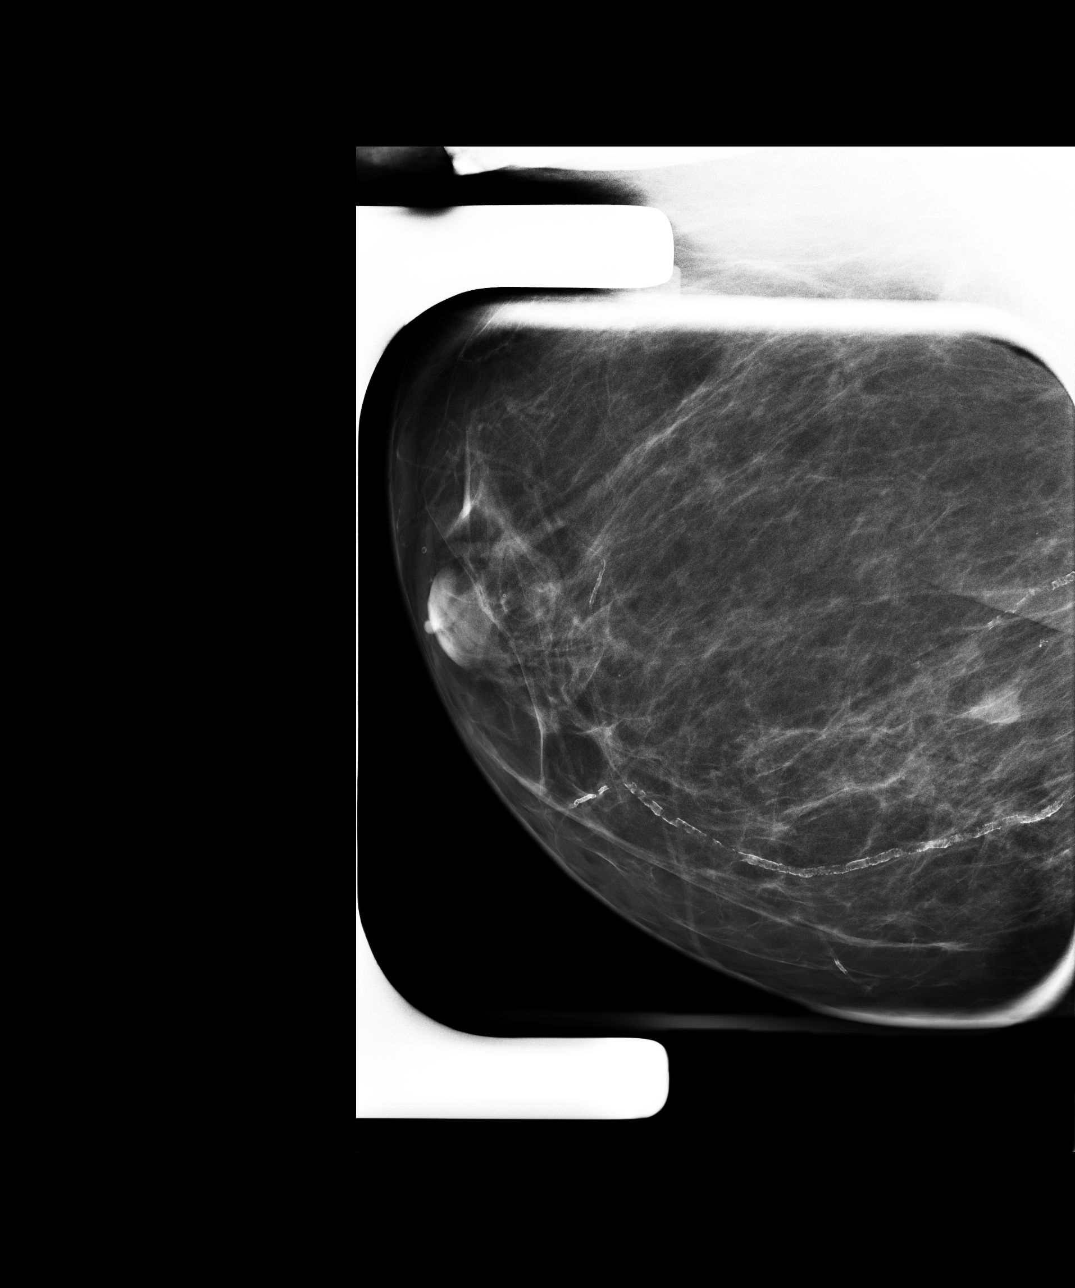

[3 of 3 positions shown; findings below may reference images not displayed]

ACR Breast Density Category b: There are scattered areas of
fibroglandular density.
FINDINGS: Spot compression views of the right breast demonstrate an 8 mm
obscured mass within the slightly outer lower right breast, new
since [KE].

On physical exam, no palpable abnormalities identified within the
outer or lower right breast

Ultrasound is performed, showing no evidence of solid or cystic
mass, distortion or abnormal areas of shadowing within the outer,
central or lower right breast.
IMPRESSION: Indeterminate 8 mm obscured mass without palpable or definite
sonographic correlate. Given that this is new since [KE], tissue
sampling is recommended.

RECOMMENDATION:
Stereotactic guided right breast biopsy, which has been scheduled
for [DATE].

I have discussed the findings and recommendations with the patient.
Results were also provided in writing at the conclusion of the
visit. If applicable, a reminder letter will be sent to the patient
regarding the next appointment.

BI-RADS CATEGORY  4: Suspicious.

## 2014-04-08 ENCOUNTER — Ambulatory Visit: Payer: Self-pay | Admitting: Oncology

## 2014-04-08 ENCOUNTER — Ambulatory Visit: Payer: Self-pay | Admitting: Family Medicine

## 2014-04-08 HISTORY — PX: BREAST LUMPECTOMY: SHX2

## 2014-04-08 IMAGING — CR MM BREAST BX W/ LOC DEV 1ST LESION IMAGE BX SPEC STEREO GUIDE*R*
6 of 8 series · 6 of 8 positions shown · non-contrast
Comparison: Previous exams.

ADDENDUM:
The final pathological diagnosis is invasive mammary carcinoma with
lymphovascular invasion. This is concordant with the imaging
findings. Surgical consultation for excision is recommended.

The final pathological diagnosis and recommendation were discussed
with the patient by telephone on [DATE]. Her questions were
answered. She reported no pain, bruising or palpable hematoma at the
biopsy site. Dr. SUICA notified Dr. [REDACTED] of the
pathological diagnosis. The patient has an appointment with Dr.
SUICA on [DATE].
CLINICAL DATA: Patient presents for stereotactic core needle biopsy
of an 8 mm mass over the outer lower right breast.
EXAM:
Right BREAST STEREOTACTIC CORE NEEDLE BIOPSY

[CC (1 of 6)]
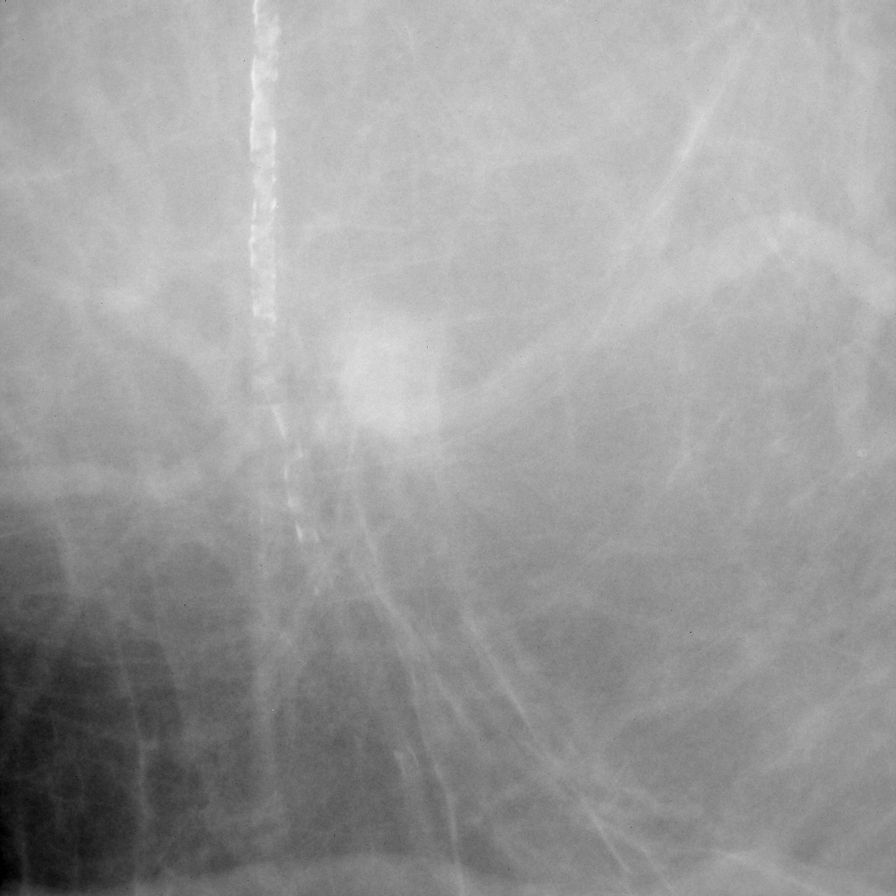

[CC (2 of 6)]
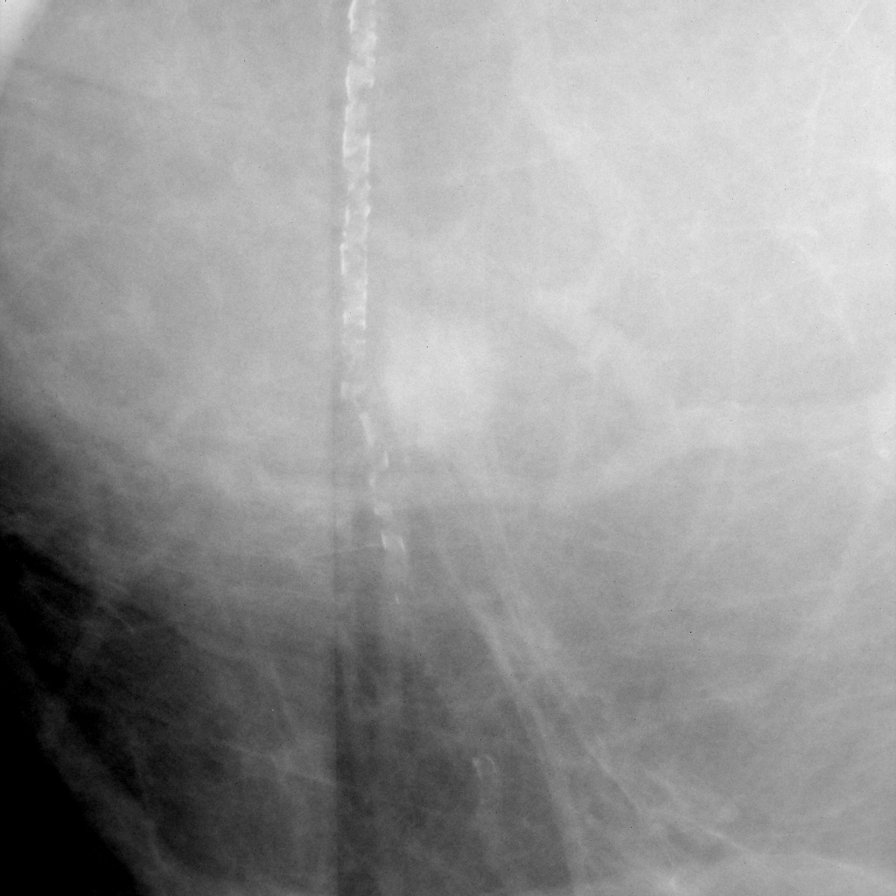

[CC (3 of 6)]
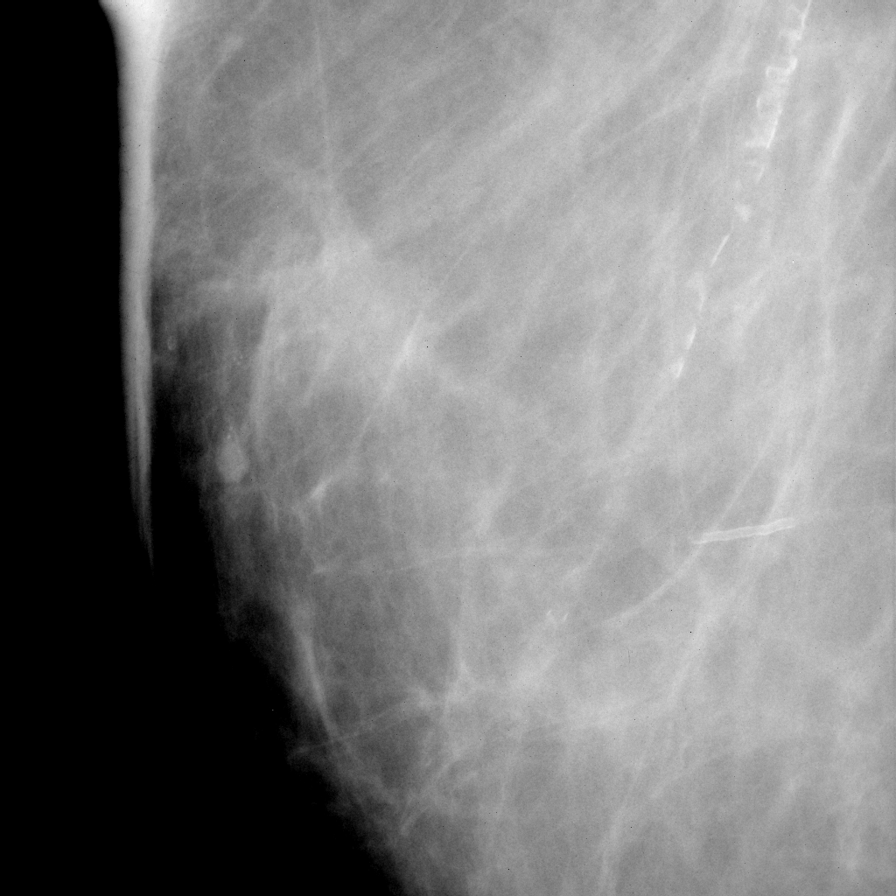

[CC (4 of 6)]
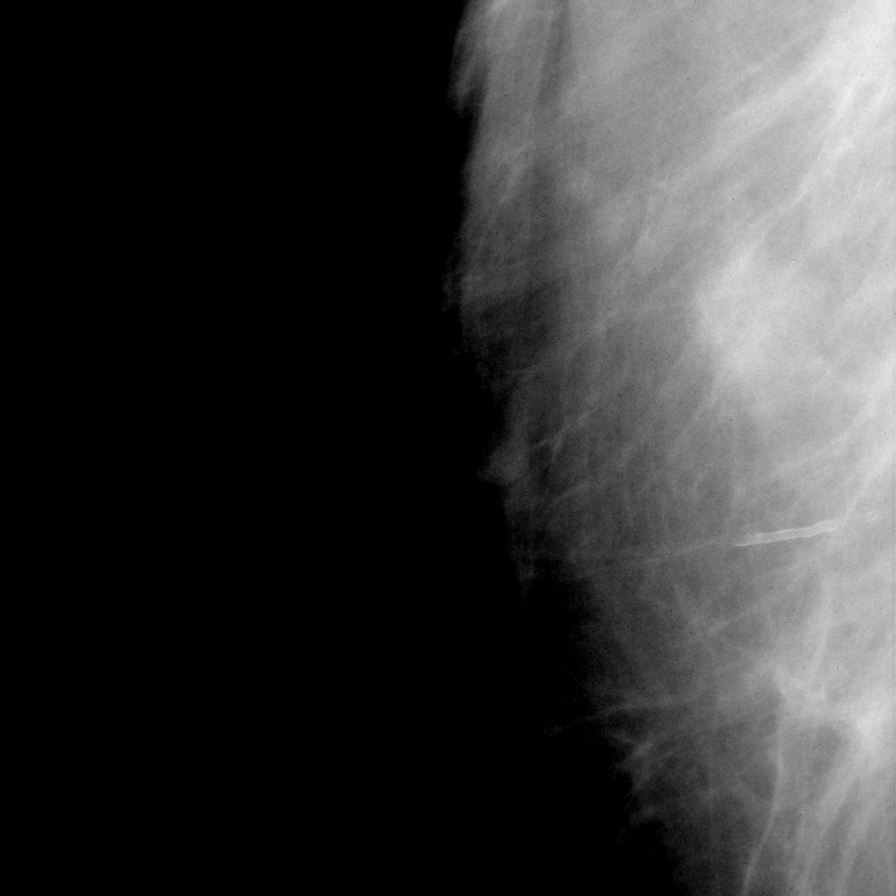

[CC (5 of 6)]
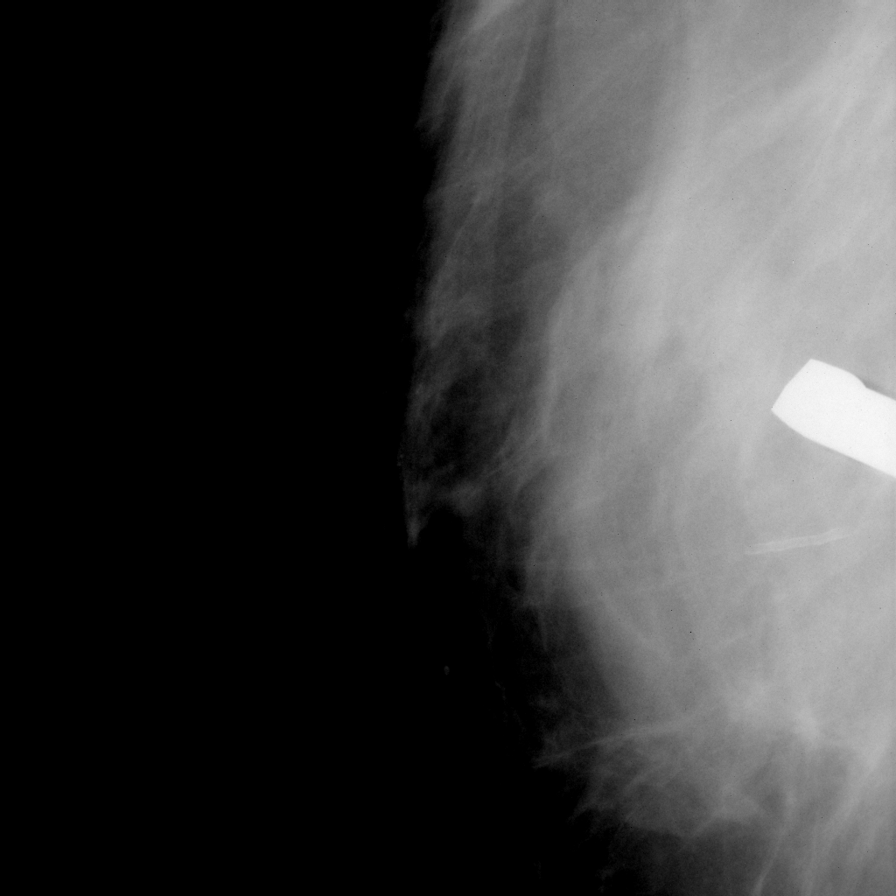

[CC (6 of 6)]
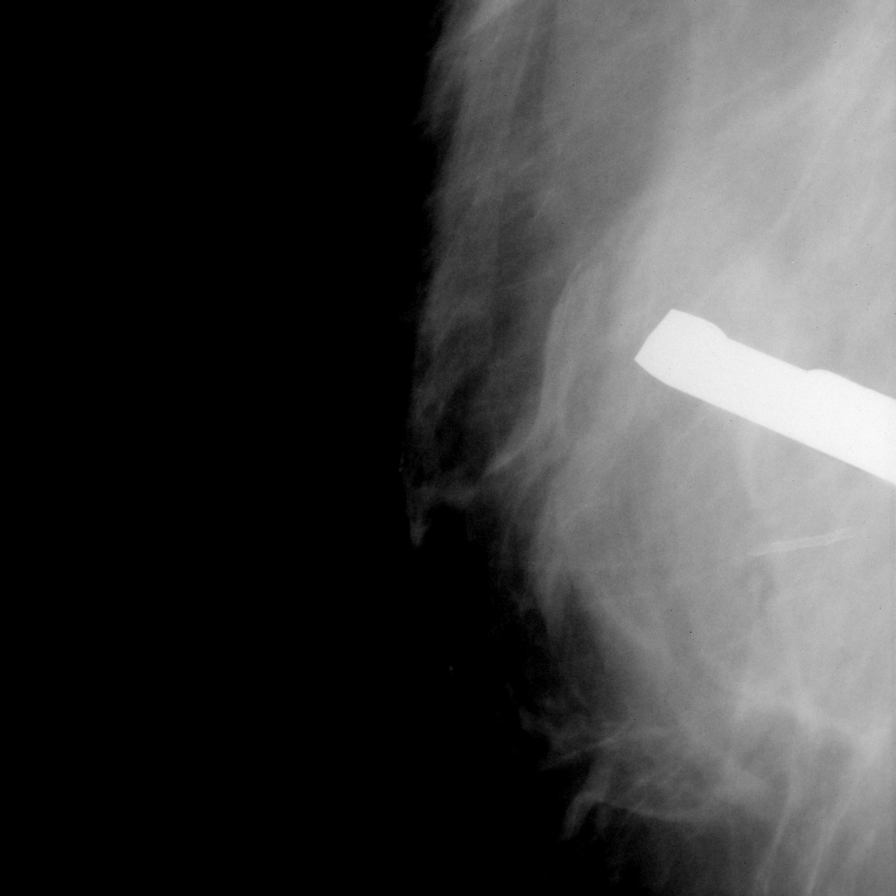

[6 of 8 positions shown; findings below may reference images not displayed]



Using sterile technique and 2% Lidocaine as local anesthetic, under
stereotactic guidance, a 9 gauge vacuum assisted device was used to
perform core needle biopsy of the targeted mass over the outer lower
right breast using a lateral to medial approach. Specimen radiograph
was not performed.

At the conclusion of the procedure, a top hat shaped tissue marker
clip was deployed into the biopsy cavity. Follow-up 2-view mammogram
confirmed clip placement accurately at the biopsy site.
IMPRESSION: Stereotactic-guided biopsy of an 8 mm right breast mass. No apparent
complications.

## 2014-04-08 IMAGING — MG MM BREAST BX W/ LOC DEV 1ST LESION IMAGE BX SPEC STEREO GUIDE*R*
1 series · 2 of 2 positions shown · non-contrast
Comparison: Previous exams.

ADDENDUM:
The final pathological diagnosis is invasive mammary carcinoma with
lymphovascular invasion. This is concordant with the imaging
findings. Surgical consultation for excision is recommended.

The final pathological diagnosis and recommendation were discussed
with the patient by telephone on [DATE]. Her questions were
answered. She reported no pain, bruising or palpable hematoma at the
biopsy site. Dr. SUICA notified Dr. [REDACTED] of the
pathological diagnosis. The patient has an appointment with Dr.
SUICA on [DATE].
CLINICAL DATA: Patient presents for stereotactic core needle biopsy
of an 8 mm mass over the outer lower right breast.
EXAM:
Right BREAST STEREOTACTIC CORE NEEDLE BIOPSY

[R CC · right · 2 of 2 slices shown]
[im 1/2]
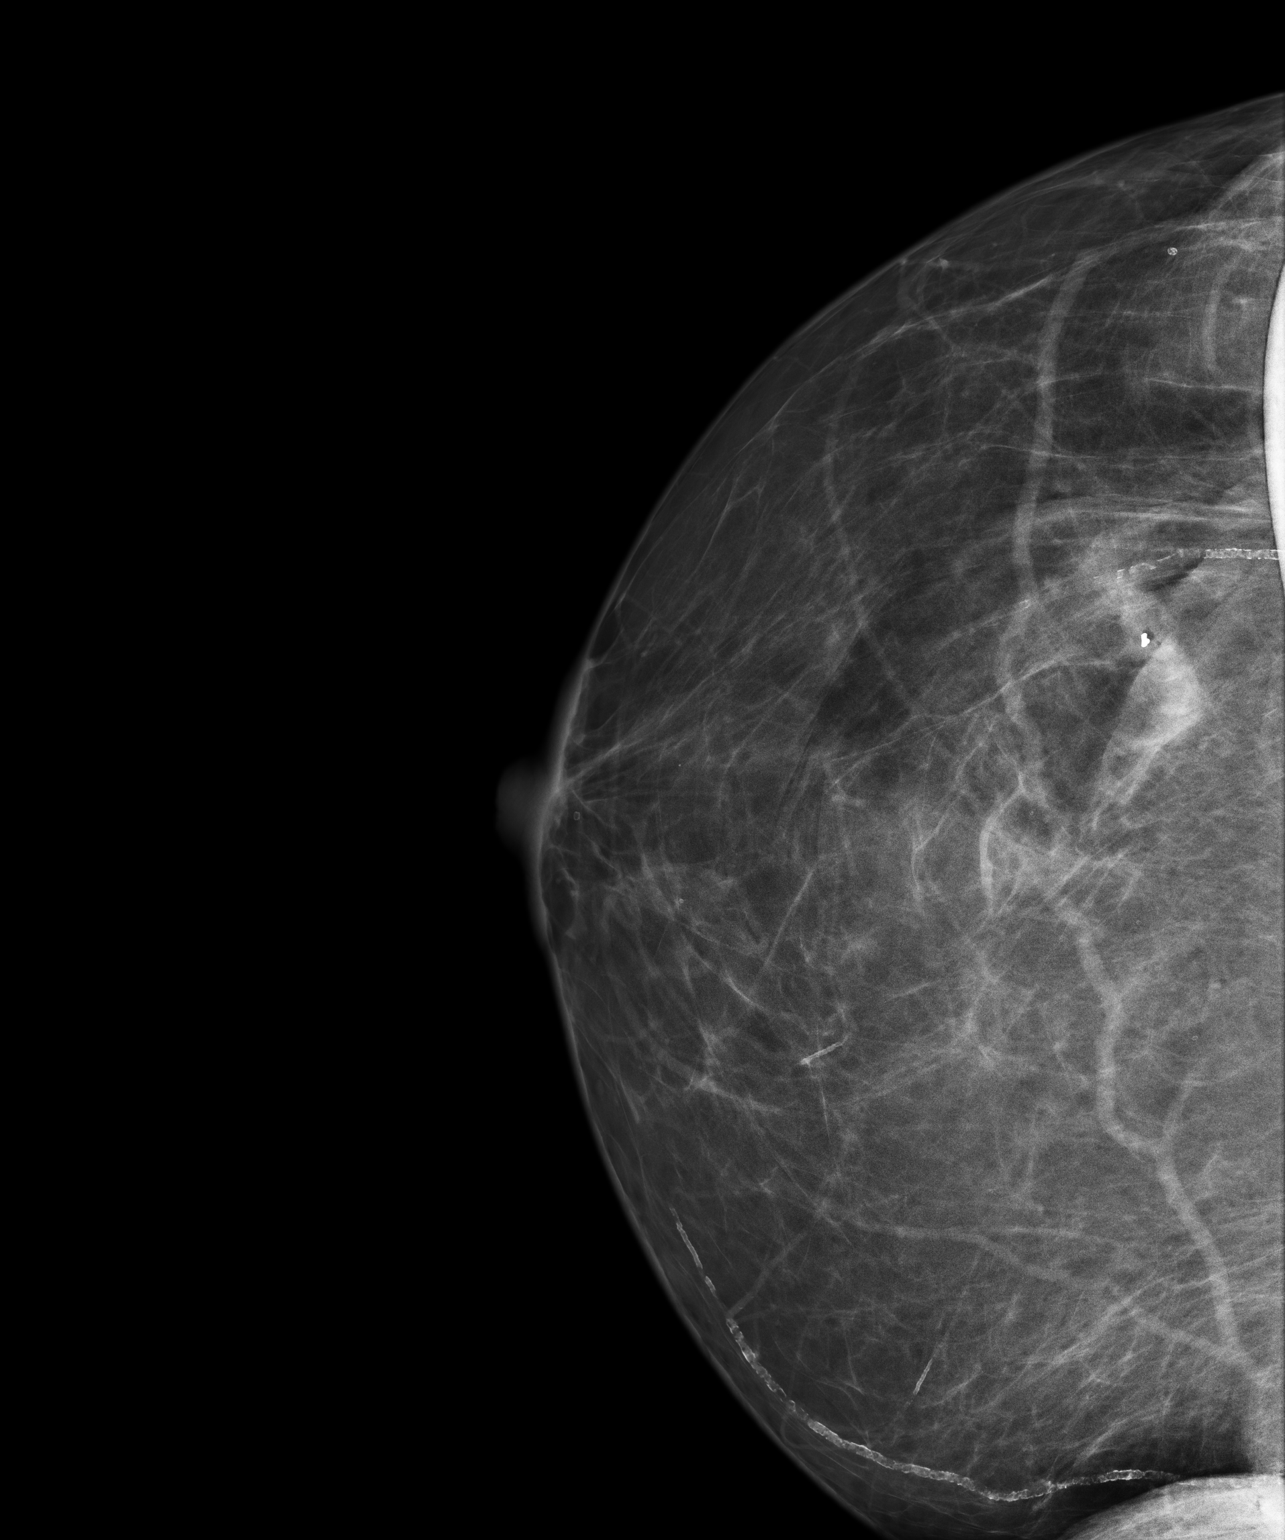
[im 2/2]
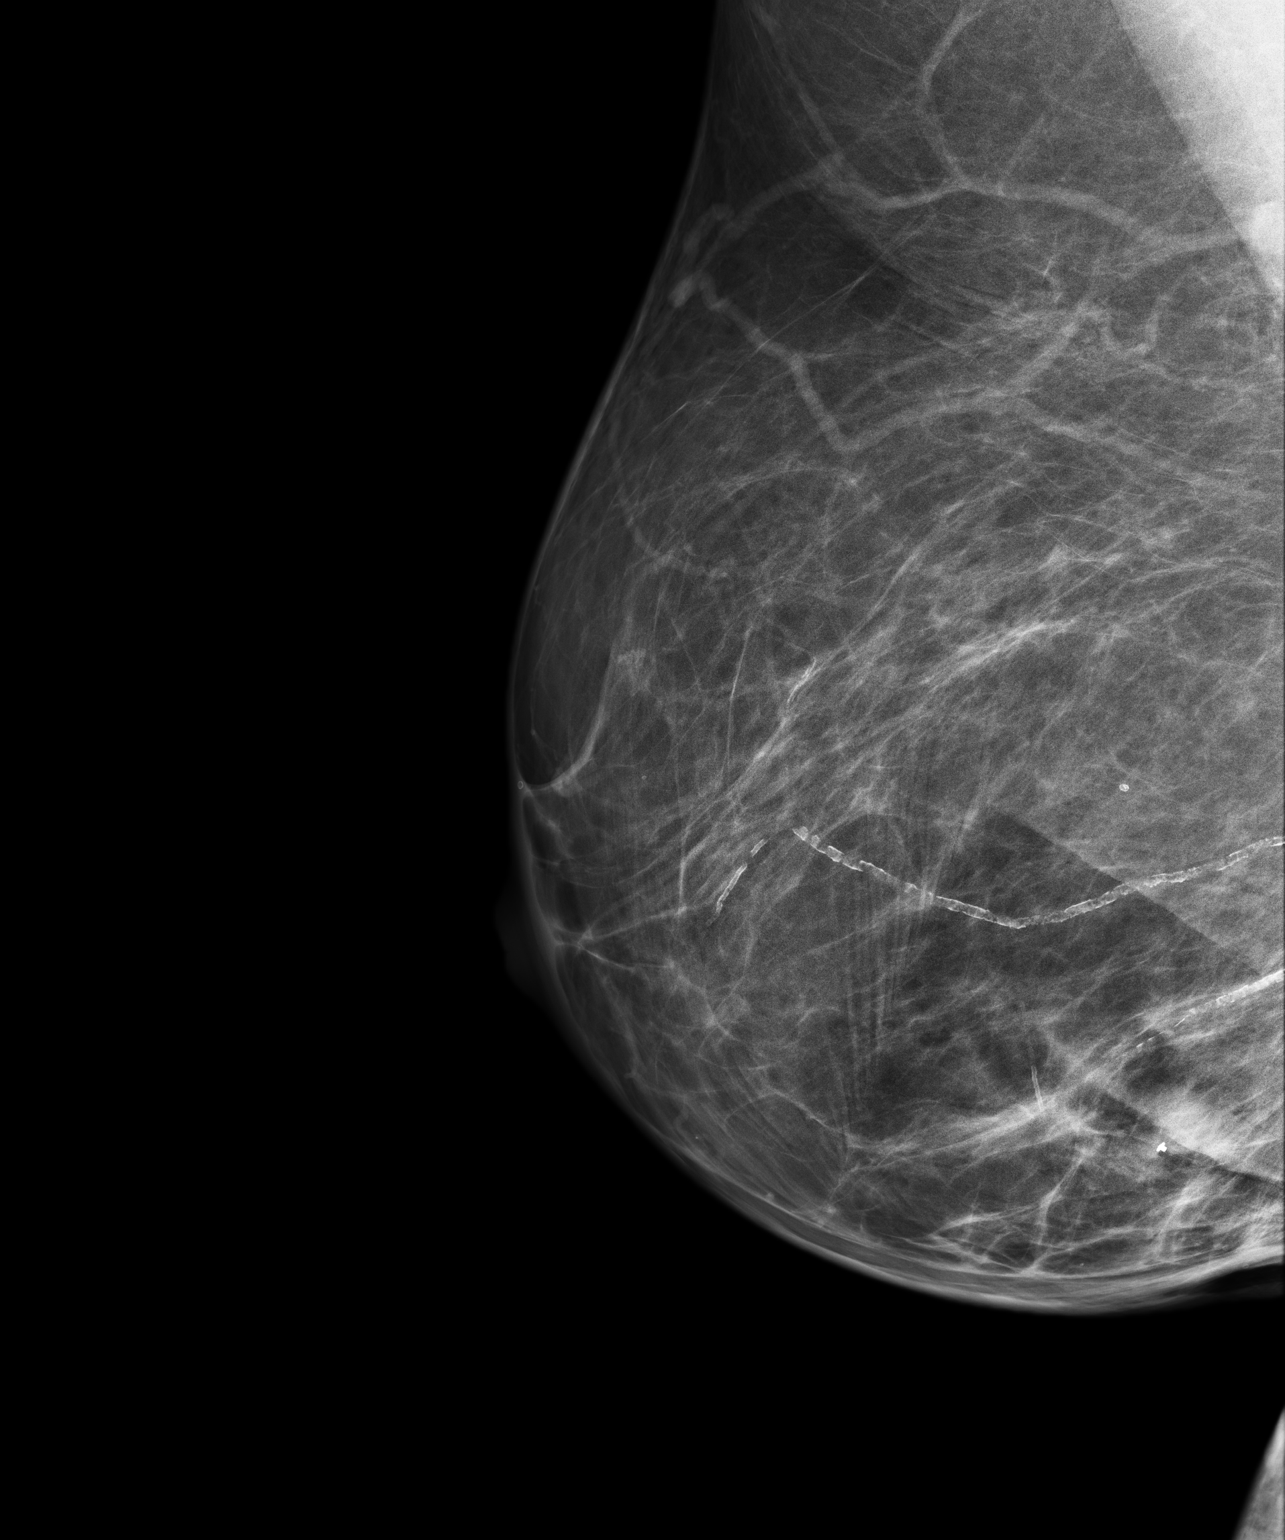

[2 of 2 positions shown; findings below may reference images not displayed]



Using sterile technique and 2% Lidocaine as local anesthetic, under
stereotactic guidance, a 9 gauge vacuum assisted device was used to
perform core needle biopsy of the targeted mass over the outer lower
right breast using a lateral to medial approach. Specimen radiograph
was not performed.

At the conclusion of the procedure, a top hat shaped tissue marker
clip was deployed into the biopsy cavity. Follow-up 2-view mammogram
confirmed clip placement accurately at the biopsy site.
IMPRESSION: Stereotactic-guided biopsy of an 8 mm right breast mass. No apparent
complications.

## 2014-04-22 LAB — PATHOLOGY REPORT

## 2014-04-24 ENCOUNTER — Ambulatory Visit: Payer: Self-pay | Admitting: Oncology

## 2014-04-25 ENCOUNTER — Ambulatory Visit: Payer: Self-pay | Admitting: Surgery

## 2014-05-02 ENCOUNTER — Ambulatory Visit: Payer: Self-pay | Admitting: Surgery

## 2014-05-02 IMAGING — MG MM BREAST SURGICAL SPECIMEN
2 series · 3 of 3 positions shown · non-contrast
Comparison: Previous exam(s)

CLINICAL DATA: Patient status post right breast lumpectomy for
recently diagnosed right breast carcinoma.

EXAM:
SPECIMEN RADIOGRAPH OF THE RIGHT BREAST

[R CC (1 of 2)]
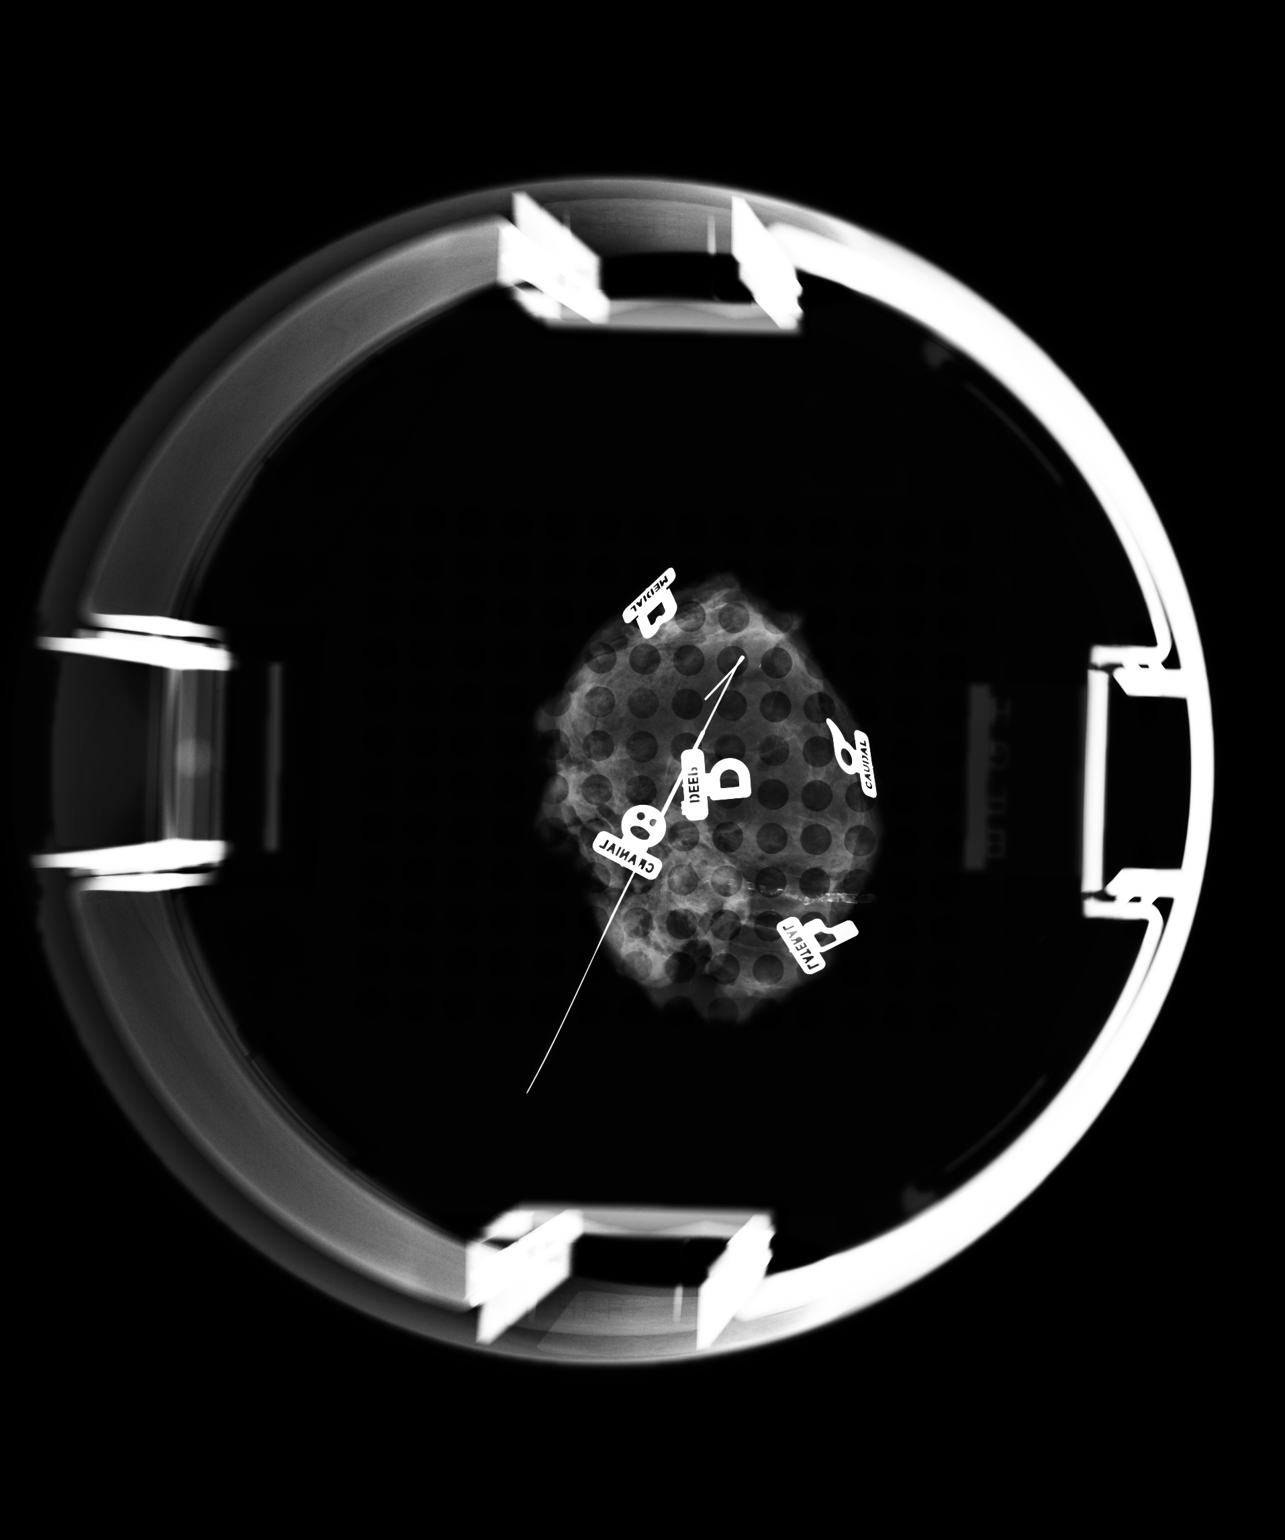

[R CC · right · 2 of 2 slices shown (2 of 2)]
[im 1/2]
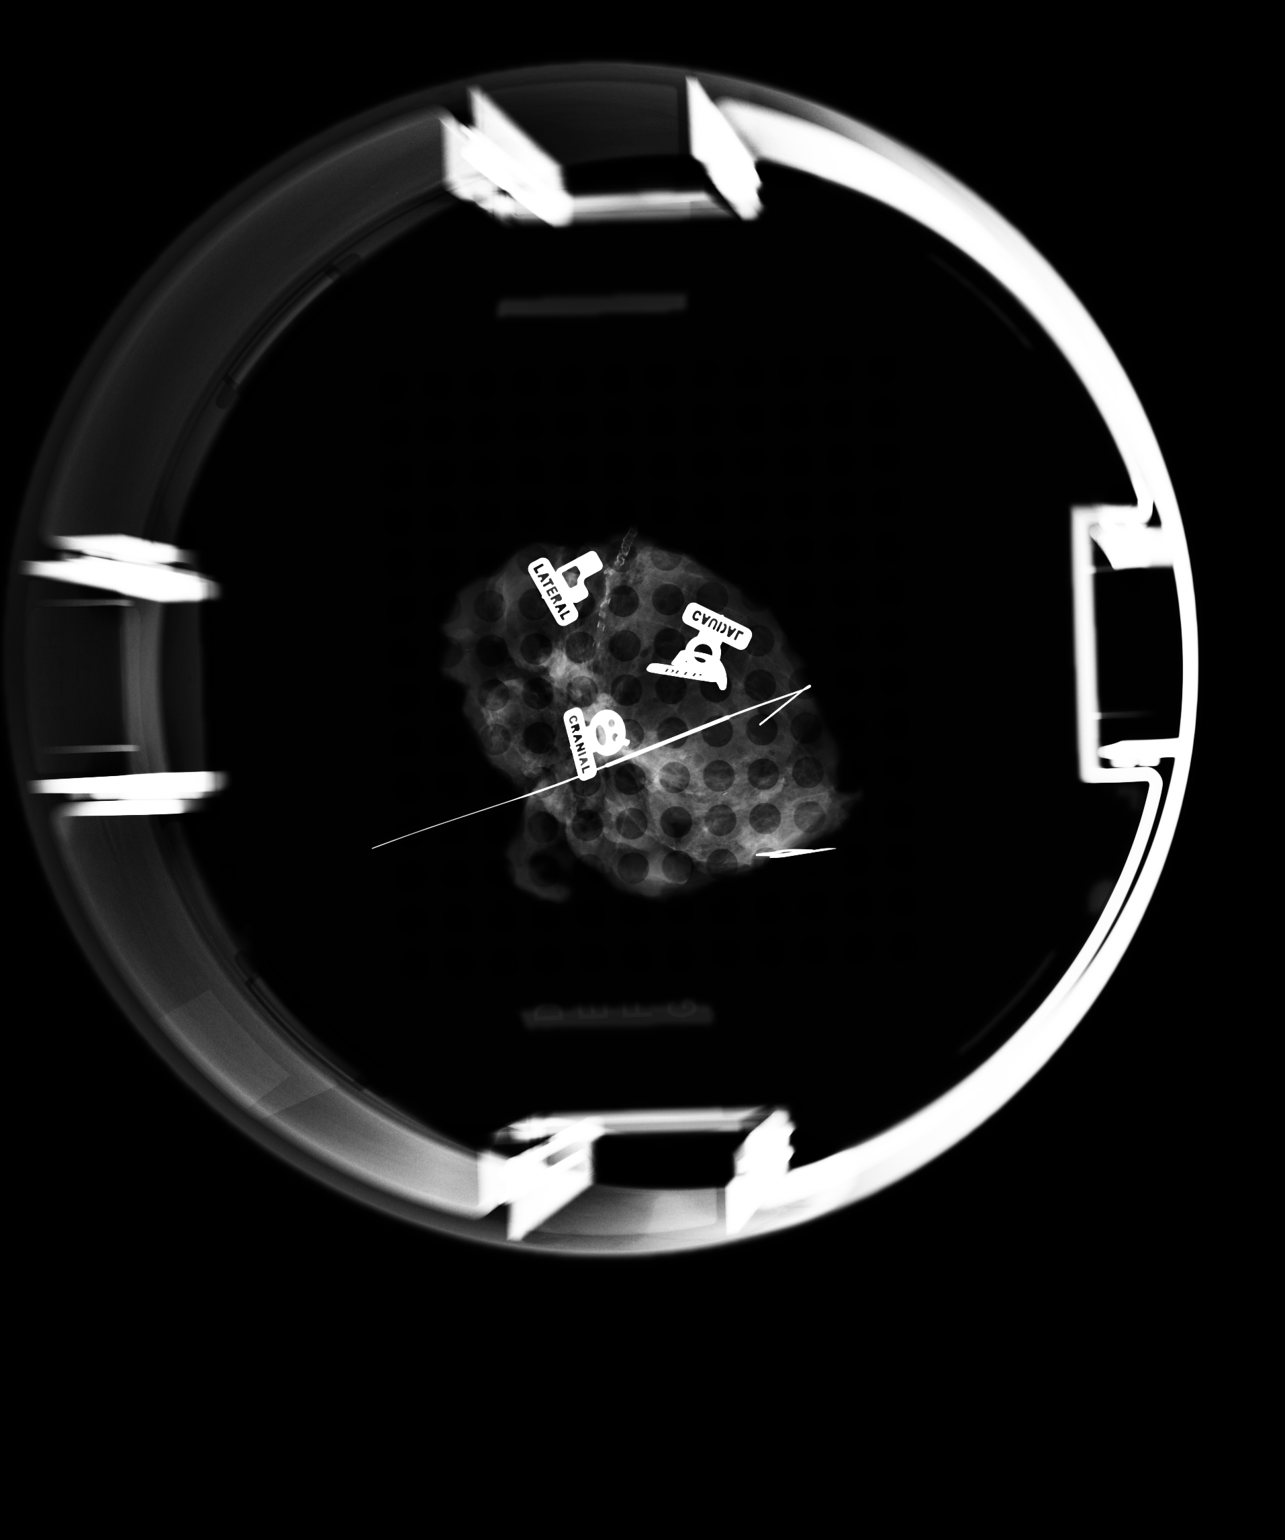
[im 2/2]
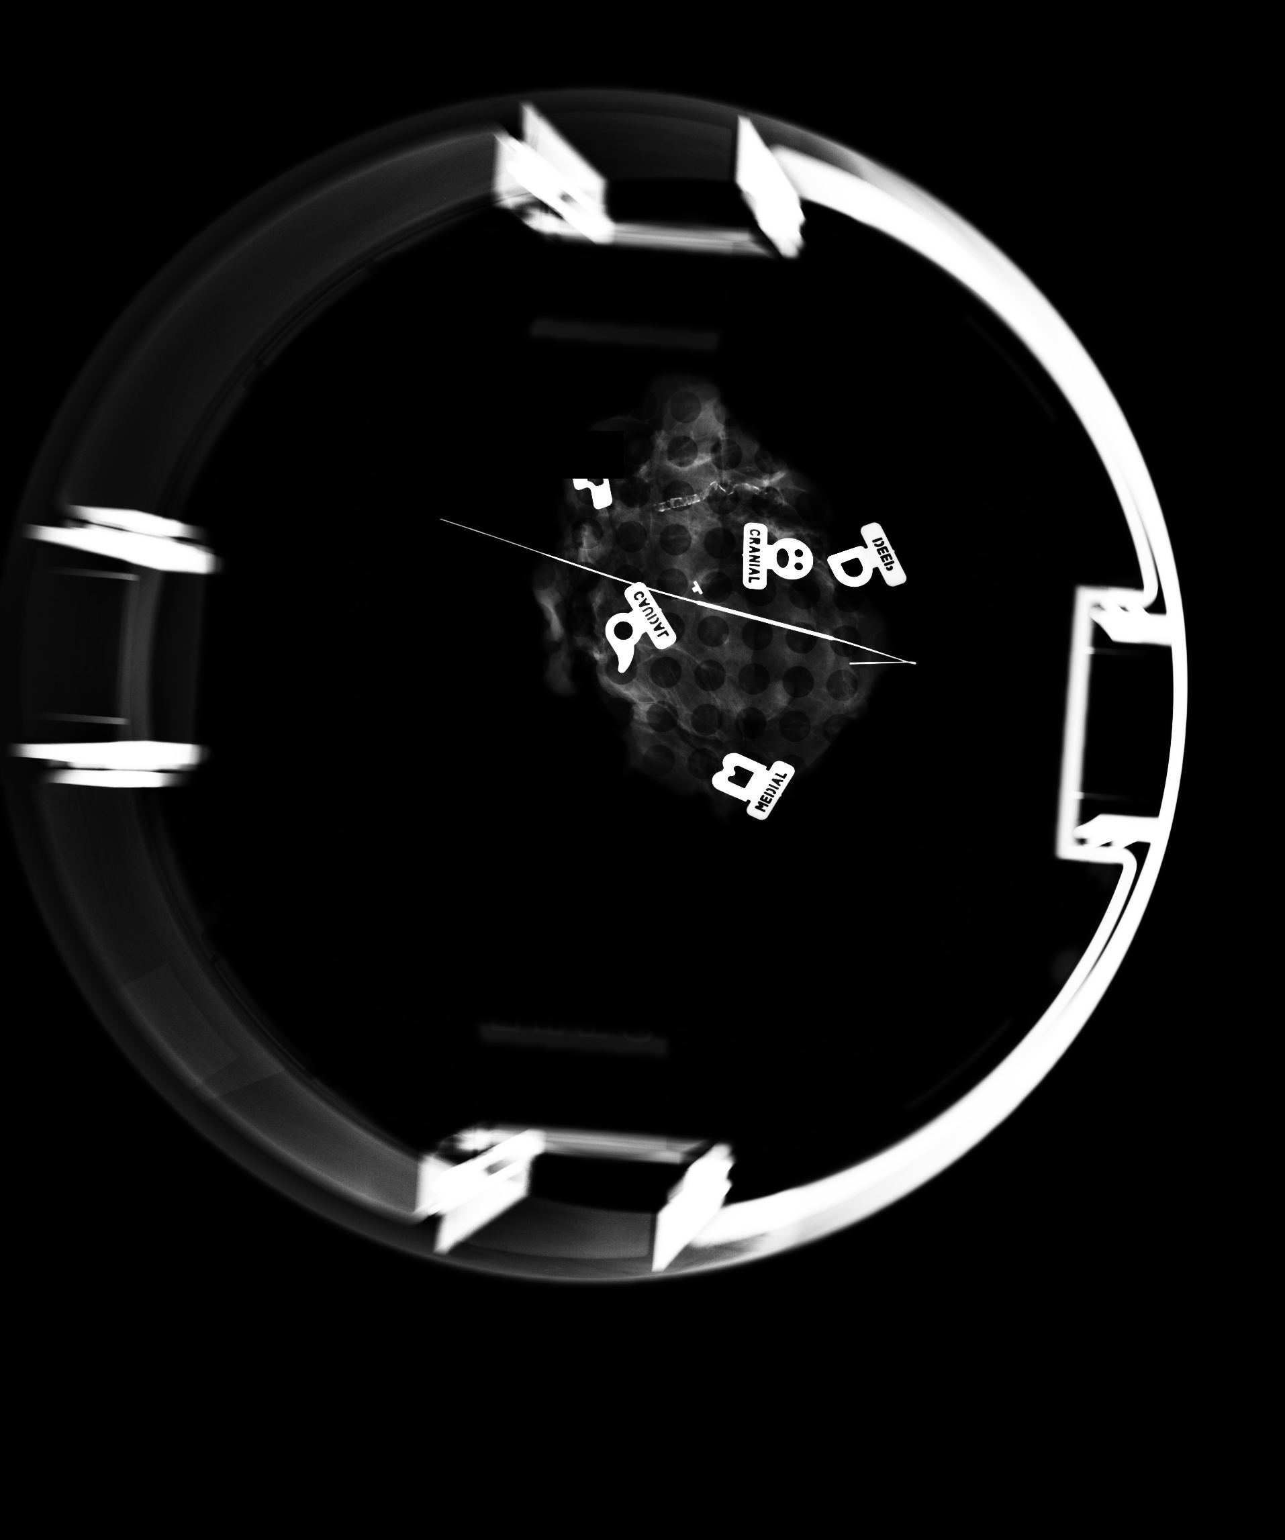

[3 of 3 positions shown; findings below may reference images not displayed]

FINDINGS: Status post excision of the right breast. The wire tip and biopsy
marker clip are present and are marked for pathology.
IMPRESSION: Specimen radiograph of the right breast.

## 2014-05-02 IMAGING — MG MM BREAST NEEDLE LOCALIZATION*R*
3 series · 8 of 8 positions shown · non-contrast
Comparison: Previous exams.

CLINICAL DATA: Patient with recent diagnosis right breast invasive
ductal carcinoma.

EXAM:
NEEDLE LOCALIZATION OF THE RIGHT BREAST WITH MAMMO GUIDANCE

[R FB · right · 6 of 13 slices shown (1 of 2)]
[im 1/13]
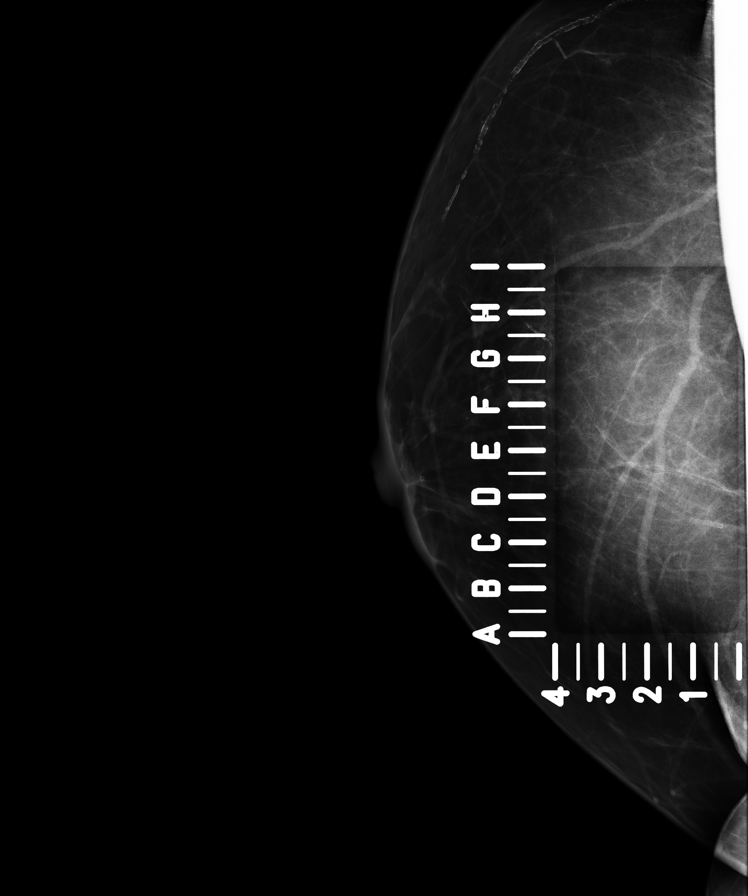
[im 3/13]
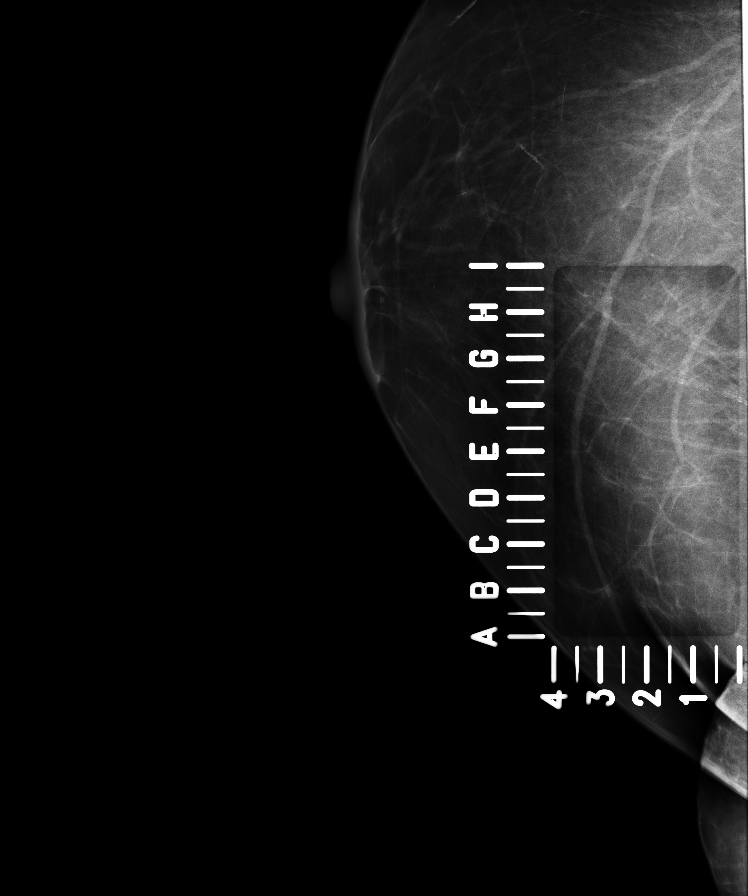
[im 5/13]
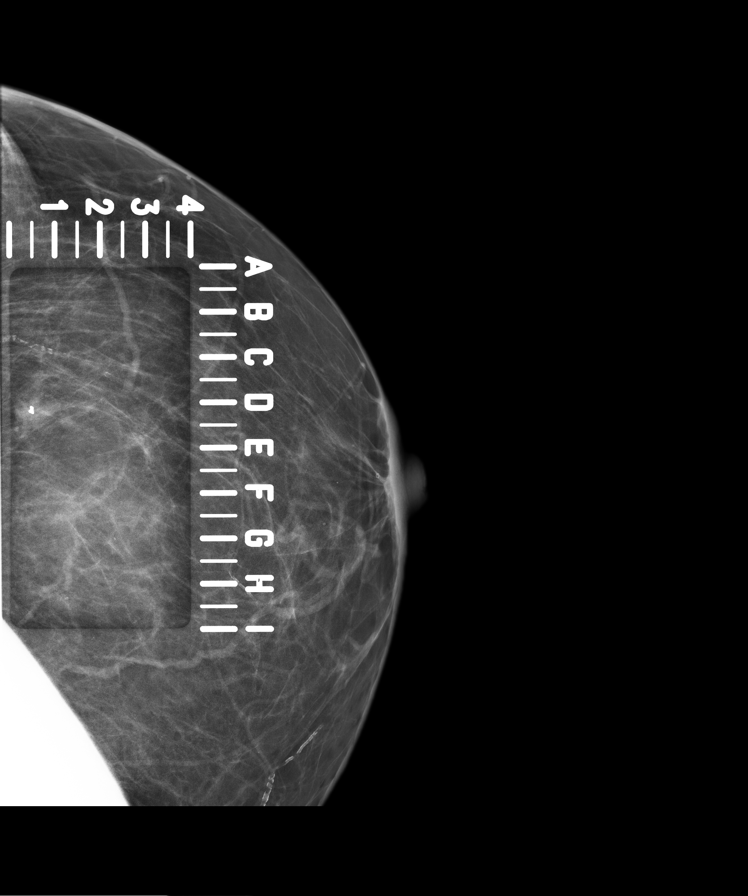
[im 8/13]
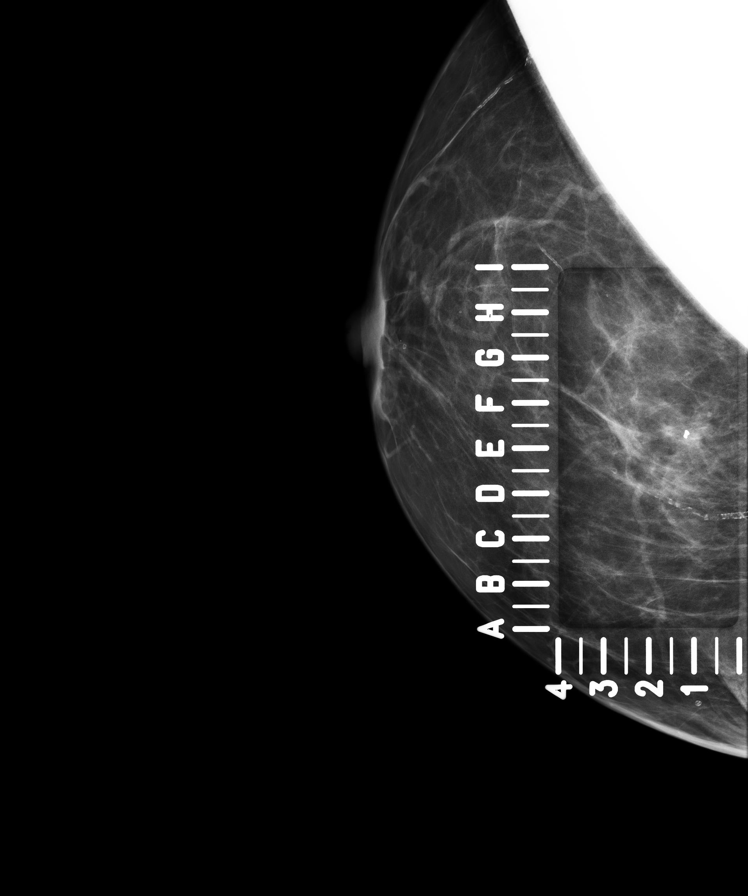
[im 10/13]
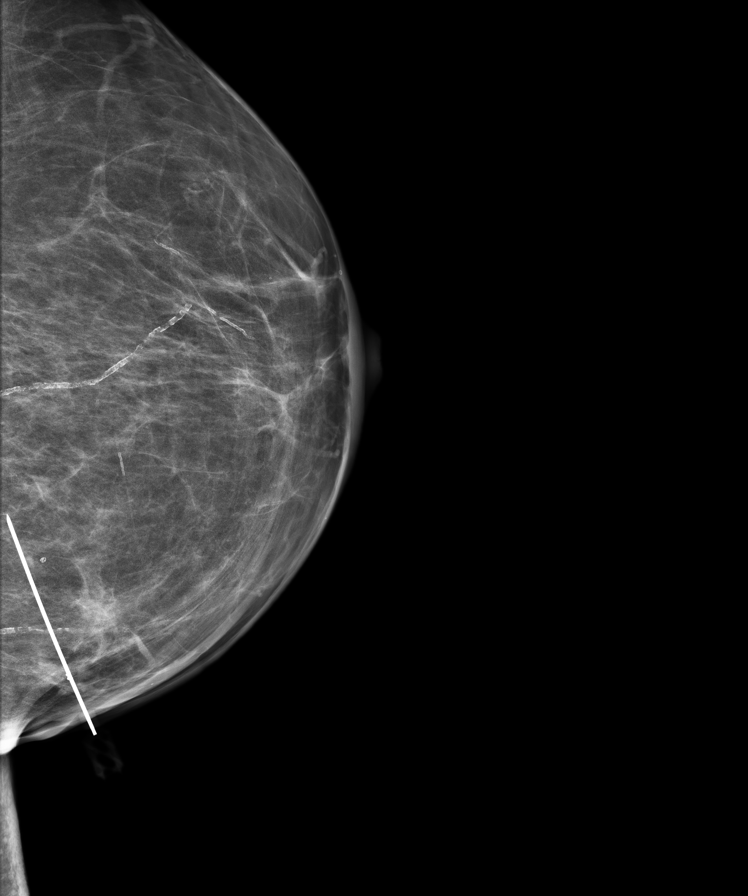
[im 13/13]
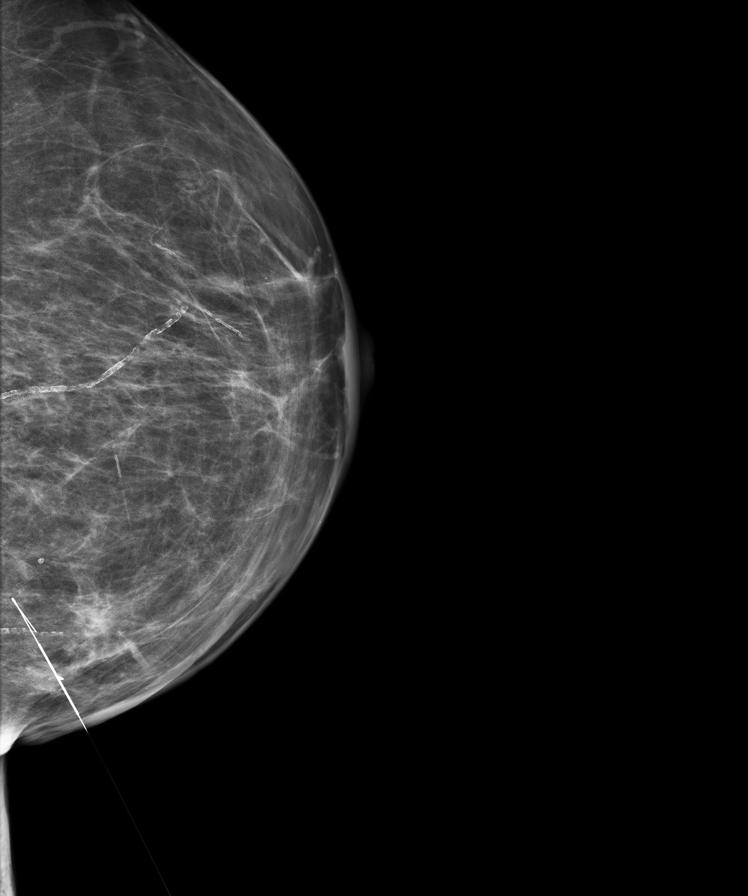

[R FB (2 of 2)]
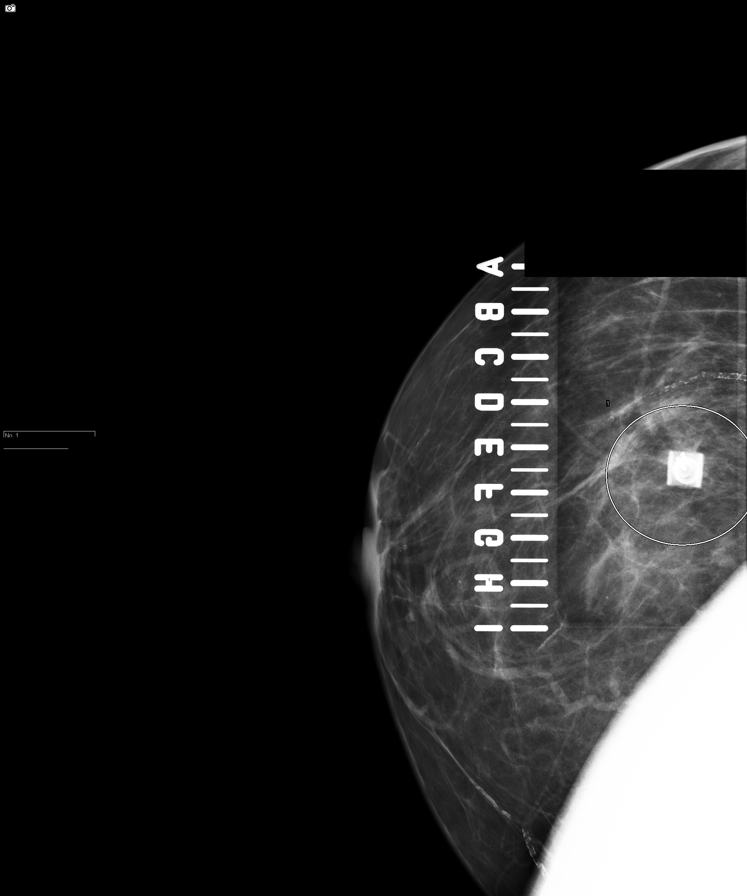

[R LM]
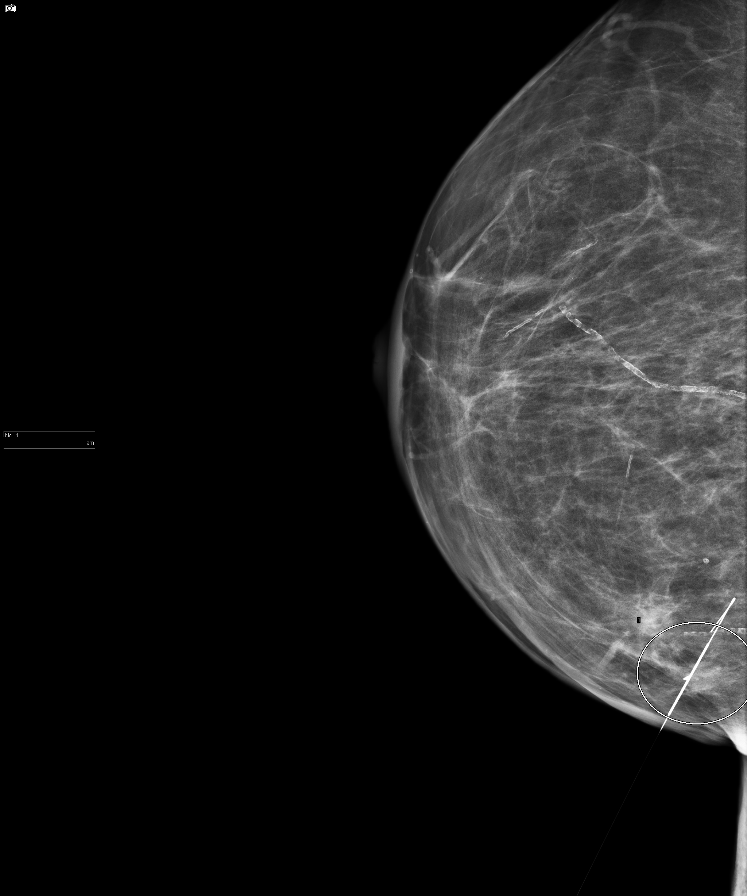

[8 of 8 positions shown; findings below may reference images not displayed]

FINDINGS: Patient presents for needle localization prior to right breast
lumpectomy. I met with the patient and we discussed the procedure of
needle localization including benefits and alternatives. We
discussed the high likelihood of a successful procedure. We
discussed the risks of the procedure, including infection, bleeding,
tissue injury, and further surgery. Informed, written consent was
given. The usual time-out protocol was performed immediately prior
to the procedure.

Using mammographic guidance, sterile technique, 2% lidocaine and a 5
cm modified Kopans needle, biopsy marking clip within the lower
outer posterior right breast was localized using caudal approach.
The films were marked for Dr. FABRICS.
IMPRESSION: Needle localization biopsy marking clip within the right breast. No
apparent complications.

## 2014-05-07 LAB — PATHOLOGY REPORT

## 2014-05-08 ENCOUNTER — Ambulatory Visit: Payer: Self-pay | Admitting: Oncology

## 2014-05-15 ENCOUNTER — Ambulatory Visit: Payer: Self-pay | Admitting: Oncology

## 2014-05-20 DIAGNOSIS — C50911 Malignant neoplasm of unspecified site of right female breast: Secondary | ICD-10-CM

## 2014-05-20 HISTORY — DX: Malignant neoplasm of unspecified site of right female breast: C50.911

## 2014-06-05 LAB — CBC CANCER CENTER
BASOS PCT: 1.4 %
Basophil #: 0.1 x10 3/mm (ref 0.0–0.1)
EOS ABS: 0.4 x10 3/mm (ref 0.0–0.7)
Eosinophil %: 3.6 %
HCT: 39.4 % (ref 35.0–47.0)
HGB: 12.5 g/dL (ref 12.0–16.0)
LYMPHS ABS: 2.2 x10 3/mm (ref 1.0–3.6)
LYMPHS PCT: 19.7 %
MCH: 27.3 pg (ref 26.0–34.0)
MCHC: 31.7 g/dL — AB (ref 32.0–36.0)
MCV: 86 fL (ref 80–100)
MONO ABS: 0.7 x10 3/mm (ref 0.2–0.9)
Monocyte %: 6.5 %
NEUTROS ABS: 7.6 x10 3/mm — AB (ref 1.4–6.5)
NEUTROS PCT: 68.8 %
PLATELETS: 259 x10 3/mm (ref 150–440)
RBC: 4.57 10*6/uL (ref 3.80–5.20)
RDW: 14.5 % (ref 11.5–14.5)
WBC: 11 x10 3/mm (ref 3.6–11.0)

## 2014-06-08 ENCOUNTER — Ambulatory Visit: Payer: Self-pay | Admitting: Oncology

## 2014-06-12 LAB — CBC CANCER CENTER
Basophil #: 0.2 x10 3/mm — ABNORMAL HIGH (ref 0.0–0.1)
Basophil %: 2.2 %
Eosinophil #: 0.5 x10 3/mm (ref 0.0–0.7)
Eosinophil %: 4.7 %
HCT: 39.9 % (ref 35.0–47.0)
HGB: 13 g/dL (ref 12.0–16.0)
Lymphocyte #: 1.2 x10 3/mm (ref 1.0–3.6)
Lymphocyte %: 12.4 %
MCH: 28.1 pg (ref 26.0–34.0)
MCHC: 32.5 g/dL (ref 32.0–36.0)
MCV: 87 fL (ref 80–100)
Monocyte #: 0.4 x10 3/mm (ref 0.2–0.9)
Monocyte %: 3.8 %
Neutrophil #: 7.5 x10 3/mm — ABNORMAL HIGH (ref 1.4–6.5)
Neutrophil %: 76.9 %
Platelet: 227 x10 3/mm (ref 150–440)
RBC: 4.61 10*6/uL (ref 3.80–5.20)
RDW: 14.4 % (ref 11.5–14.5)
WBC: 9.8 x10 3/mm (ref 3.6–11.0)

## 2014-06-19 LAB — CBC CANCER CENTER
BASOS ABS: 0.1 x10 3/mm (ref 0.0–0.1)
Basophil %: 0.9 %
Eosinophil #: 0.4 x10 3/mm (ref 0.0–0.7)
Eosinophil %: 4.1 %
HCT: 39 % (ref 35.0–47.0)
HGB: 12.5 g/dL (ref 12.0–16.0)
Lymphocyte #: 1.3 x10 3/mm (ref 1.0–3.6)
Lymphocyte %: 13 %
MCH: 27.3 pg (ref 26.0–34.0)
MCHC: 31.9 g/dL — ABNORMAL LOW (ref 32.0–36.0)
MCV: 86 fL (ref 80–100)
MONO ABS: 0.6 x10 3/mm (ref 0.2–0.9)
Monocyte %: 6 %
NEUTROS PCT: 76 %
Neutrophil #: 7.7 x10 3/mm — ABNORMAL HIGH (ref 1.4–6.5)
PLATELETS: 238 x10 3/mm (ref 150–440)
RBC: 4.56 10*6/uL (ref 3.80–5.20)
RDW: 14.6 % — AB (ref 11.5–14.5)
WBC: 10.1 x10 3/mm (ref 3.6–11.0)

## 2014-06-26 LAB — CBC CANCER CENTER
BASOS ABS: 0.1 x10 3/mm (ref 0.0–0.1)
BASOS PCT: 1 %
Eosinophil #: 0.3 x10 3/mm (ref 0.0–0.7)
Eosinophil %: 3.2 %
HCT: 40.7 % (ref 35.0–47.0)
HGB: 13.3 g/dL (ref 12.0–16.0)
Lymphocyte #: 1.3 x10 3/mm (ref 1.0–3.6)
Lymphocyte %: 12.9 %
MCH: 27.7 pg (ref 26.0–34.0)
MCHC: 32.6 g/dL (ref 32.0–36.0)
MCV: 85 fL (ref 80–100)
Monocyte #: 0.7 x10 3/mm (ref 0.2–0.9)
Monocyte %: 6.8 %
Neutrophil #: 7.5 x10 3/mm — ABNORMAL HIGH (ref 1.4–6.5)
Neutrophil %: 76.1 %
Platelet: 246 x10 3/mm (ref 150–440)
RBC: 4.79 10*6/uL (ref 3.80–5.20)
RDW: 14.5 % (ref 11.5–14.5)
WBC: 9.9 x10 3/mm (ref 3.6–11.0)

## 2014-07-03 LAB — CBC CANCER CENTER
Basophil #: 0.1 x10 3/mm (ref 0.0–0.1)
Basophil %: 0.7 %
Eosinophil #: 0.3 x10 3/mm (ref 0.0–0.7)
Eosinophil %: 3.5 %
HCT: 39.3 % (ref 35.0–47.0)
HGB: 12.8 g/dL (ref 12.0–16.0)
LYMPHS ABS: 1 x10 3/mm (ref 1.0–3.6)
Lymphocyte %: 11.2 %
MCH: 27.7 pg (ref 26.0–34.0)
MCHC: 32.5 g/dL (ref 32.0–36.0)
MCV: 85 fL (ref 80–100)
MONO ABS: 0.6 x10 3/mm (ref 0.2–0.9)
Monocyte %: 6 %
NEUTROS ABS: 7.3 x10 3/mm — AB (ref 1.4–6.5)
NEUTROS PCT: 78.6 %
Platelet: 227 x10 3/mm (ref 150–440)
RBC: 4.6 10*6/uL (ref 3.80–5.20)
RDW: 14.3 % (ref 11.5–14.5)
WBC: 9.3 x10 3/mm (ref 3.6–11.0)

## 2014-07-09 ENCOUNTER — Ambulatory Visit: Payer: Self-pay | Admitting: Oncology

## 2014-07-10 LAB — CBC CANCER CENTER
Basophil #: 0.1 x10 3/mm (ref 0.0–0.1)
Basophil %: 0.9 %
EOS PCT: 3.8 %
Eosinophil #: 0.3 x10 3/mm (ref 0.0–0.7)
HCT: 39.6 % (ref 35.0–47.0)
HGB: 12.9 g/dL (ref 12.0–16.0)
Lymphocyte #: 1.1 x10 3/mm (ref 1.0–3.6)
Lymphocyte %: 13 %
MCH: 27.7 pg (ref 26.0–34.0)
MCHC: 32.5 g/dL (ref 32.0–36.0)
MCV: 85 fL (ref 80–100)
Monocyte #: 0.6 x10 3/mm (ref 0.2–0.9)
Monocyte %: 7 %
Neutrophil #: 6.5 x10 3/mm (ref 1.4–6.5)
Neutrophil %: 75.3 %
Platelet: 233 x10 3/mm (ref 150–440)
RBC: 4.64 10*6/uL (ref 3.80–5.20)
RDW: 14.6 % — ABNORMAL HIGH (ref 11.5–14.5)
WBC: 8.7 x10 3/mm (ref 3.6–11.0)

## 2014-07-17 LAB — CBC CANCER CENTER
Basophil #: 0.1 x10 3/mm (ref 0.0–0.1)
Basophil %: 1 %
Eosinophil #: 0.3 x10 3/mm (ref 0.0–0.7)
Eosinophil %: 3.5 %
HCT: 39.4 % (ref 35.0–47.0)
HGB: 12.7 g/dL (ref 12.0–16.0)
LYMPHS ABS: 1.2 x10 3/mm (ref 1.0–3.6)
LYMPHS PCT: 12.1 %
MCH: 27.5 pg (ref 26.0–34.0)
MCHC: 32.4 g/dL (ref 32.0–36.0)
MCV: 85 fL (ref 80–100)
Monocyte #: 0.6 x10 3/mm (ref 0.2–0.9)
Monocyte %: 6.3 %
Neutrophil #: 7.5 x10 3/mm — ABNORMAL HIGH (ref 1.4–6.5)
Neutrophil %: 77.1 %
Platelet: 246 x10 3/mm (ref 150–440)
RBC: 4.63 10*6/uL (ref 3.80–5.20)
RDW: 14.8 % — AB (ref 11.5–14.5)
WBC: 9.7 x10 3/mm (ref 3.6–11.0)

## 2014-08-08 ENCOUNTER — Ambulatory Visit: Payer: Self-pay | Admitting: Oncology

## 2014-09-08 ENCOUNTER — Ambulatory Visit: Payer: Self-pay | Admitting: Oncology

## 2014-10-24 DIAGNOSIS — N816 Rectocele: Secondary | ICD-10-CM | POA: Insufficient documentation

## 2014-10-24 DIAGNOSIS — N3946 Mixed incontinence: Secondary | ICD-10-CM | POA: Insufficient documentation

## 2014-10-24 DIAGNOSIS — E668 Other obesity: Secondary | ICD-10-CM | POA: Insufficient documentation

## 2014-11-18 ENCOUNTER — Ambulatory Visit: Payer: Self-pay | Admitting: Oncology

## 2014-11-18 LAB — COMPREHENSIVE METABOLIC PANEL
ALT: 26 U/L
Albumin: 3.4 g/dL (ref 3.4–5.0)
Alkaline Phosphatase: 80 U/L
Anion Gap: 4 — ABNORMAL LOW (ref 7–16)
BILIRUBIN TOTAL: 0.4 mg/dL (ref 0.2–1.0)
BUN: 22 mg/dL — AB (ref 7–18)
CHLORIDE: 100 mmol/L (ref 98–107)
CREATININE: 0.88 mg/dL (ref 0.60–1.30)
Calcium, Total: 9 mg/dL (ref 8.5–10.1)
Co2: 35 mmol/L — ABNORMAL HIGH (ref 21–32)
EGFR (Non-African Amer.): >60
Glucose: 70 mg/dL (ref 65–99)
OSMOLALITY: 279 (ref 275–301)
Potassium: 3.7 mmol/L (ref 3.5–5.1)
SGOT(AST): 17 U/L (ref 15–37)
SODIUM: 139 mmol/L (ref 136–145)
TOTAL PROTEIN: 7.5 g/dL (ref 6.4–8.2)

## 2014-11-18 LAB — CBC CANCER CENTER
BASOS ABS: 0.1 x10 3/mm (ref 0.0–0.1)
Basophil %: 1 %
EOS ABS: 0.3 x10 3/mm (ref 0.0–0.7)
EOS PCT: 3.9 %
HCT: 38.8 % (ref 35.0–47.0)
HGB: 12.8 g/dL (ref 12.0–16.0)
LYMPHS PCT: 17 %
Lymphocyte #: 1.5 x10 3/mm (ref 1.0–3.6)
MCH: 27.3 pg (ref 26.0–34.0)
MCHC: 33.1 g/dL (ref 32.0–36.0)
MCV: 83 fL (ref 80–100)
Monocyte #: 0.6 x10 3/mm (ref 0.2–0.9)
Monocyte %: 7.2 %
Neutrophil #: 6.3 x10 3/mm (ref 1.4–6.5)
Neutrophil %: 70.9 %
Platelet: 264 x10 3/mm (ref 150–440)
RBC: 4.7 10*6/uL (ref 3.80–5.20)
RDW: 13.9 % (ref 11.5–14.5)
WBC: 8.9 x10 3/mm (ref 3.6–11.0)

## 2014-12-09 ENCOUNTER — Ambulatory Visit: Payer: Self-pay | Admitting: Oncology

## 2015-01-06 ENCOUNTER — Observation Stay: Payer: Self-pay | Admitting: Internal Medicine

## 2015-01-07 IMAGING — US US EXTREM LOW VENOUS BILAT
1 series · 13 of 24 positions shown · non-contrast
Comparison: None.

CLINICAL DATA: Bilateral lower extremity chronic edema and
discolorations. Ulcerations.



[Series 1: us extrem low venous bilat · 0.09mm/px · 13 of 54 slices shown]
[im 1/54]
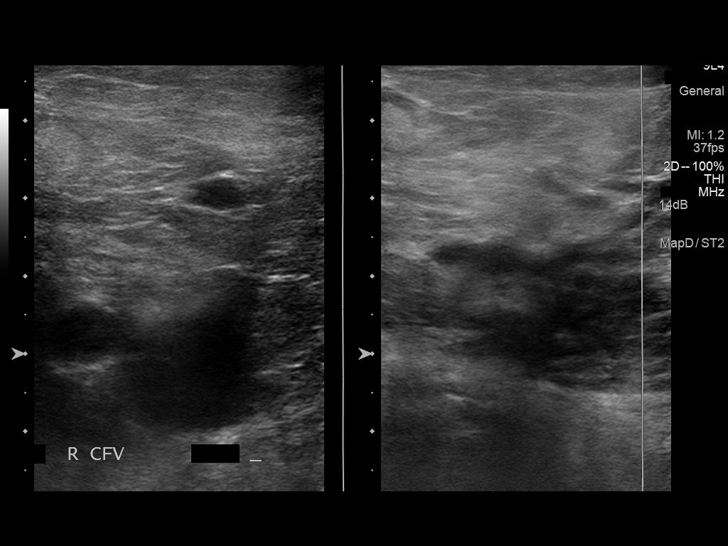
[im 5/54]
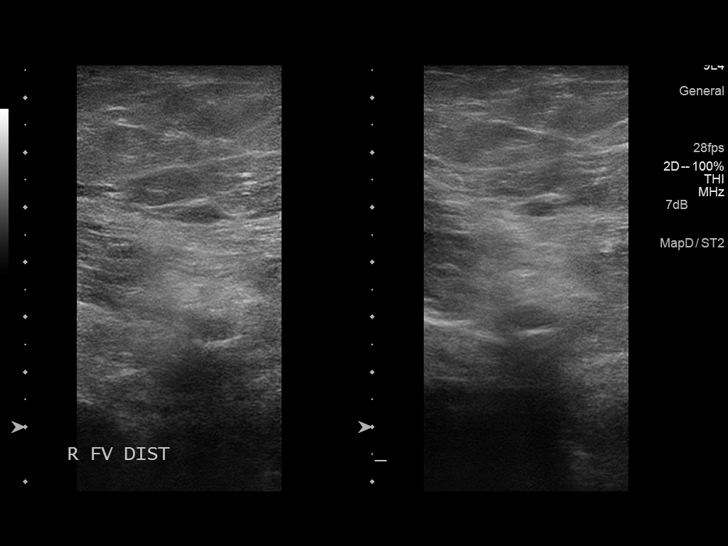
[im 10/54]
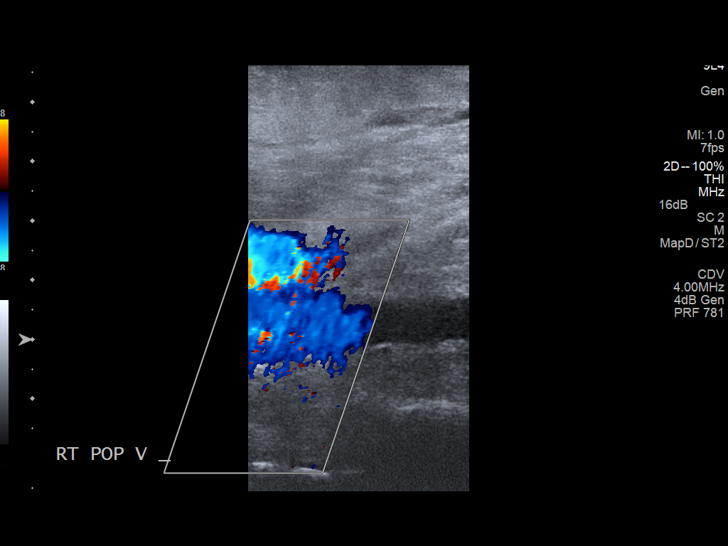
[im 14/54]
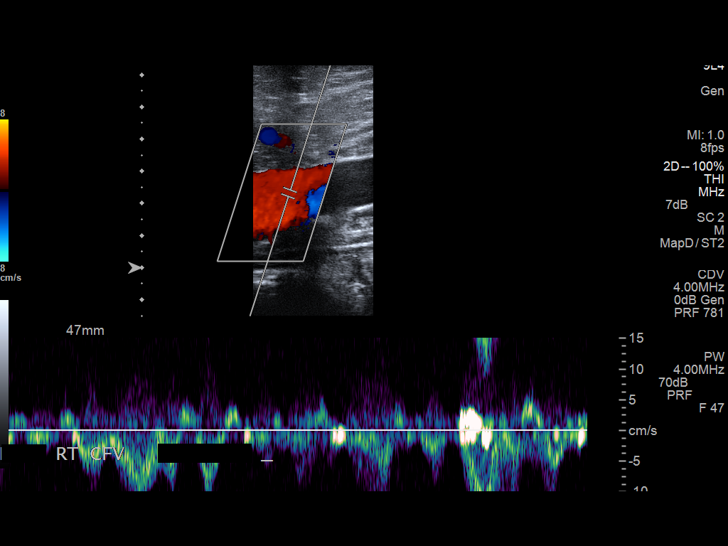
[im 19/54]
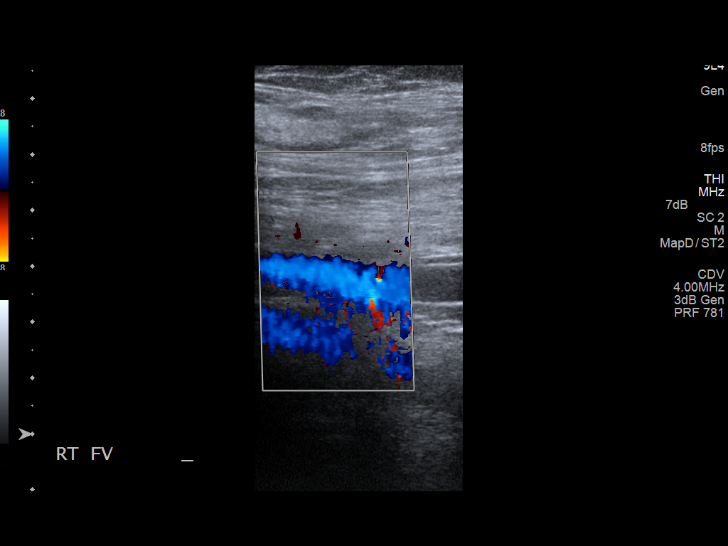
[im 24/54]
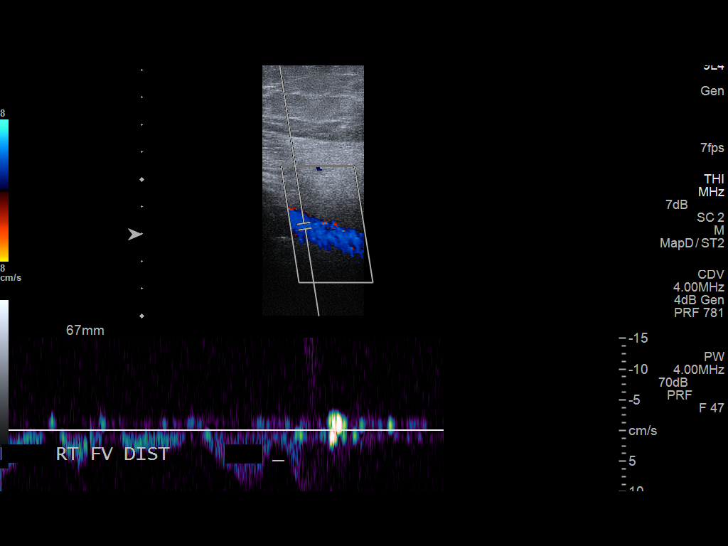
[im 28/54]
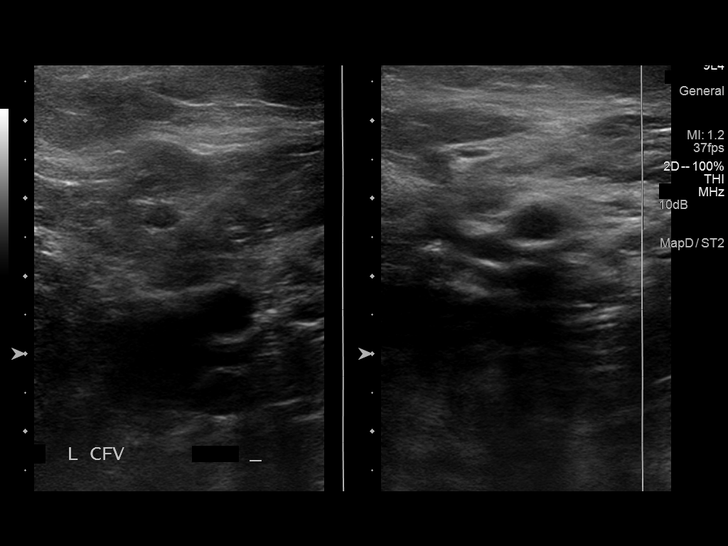
[im 30/54]
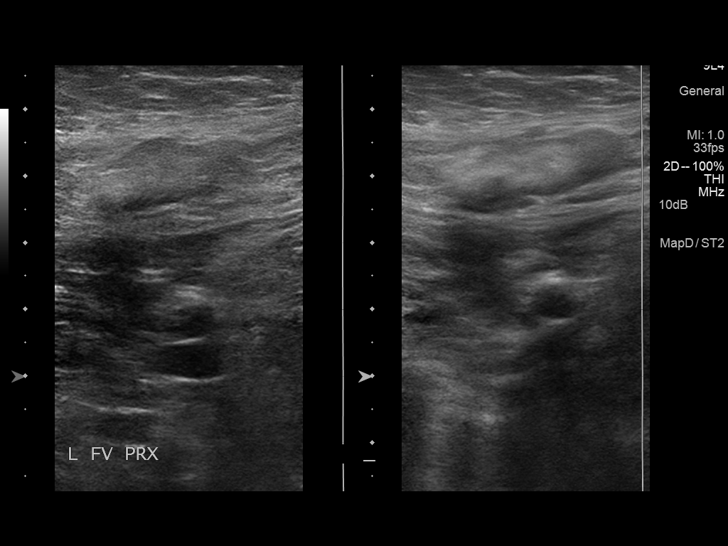
[im 35/54]
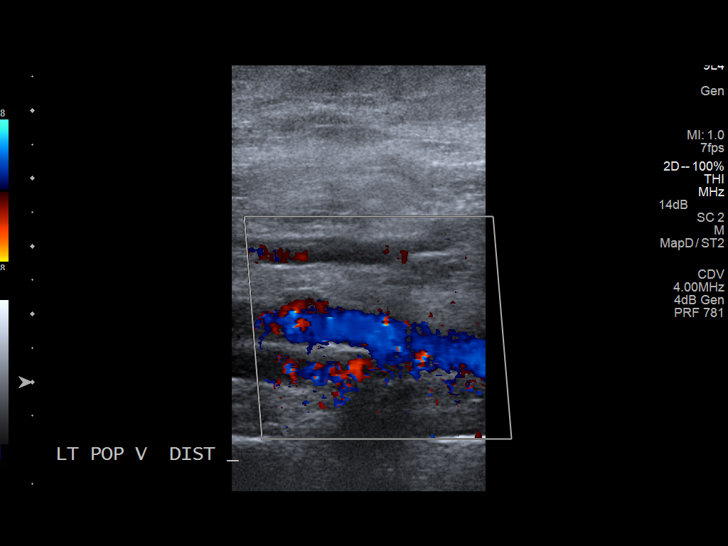
[im 40/54]
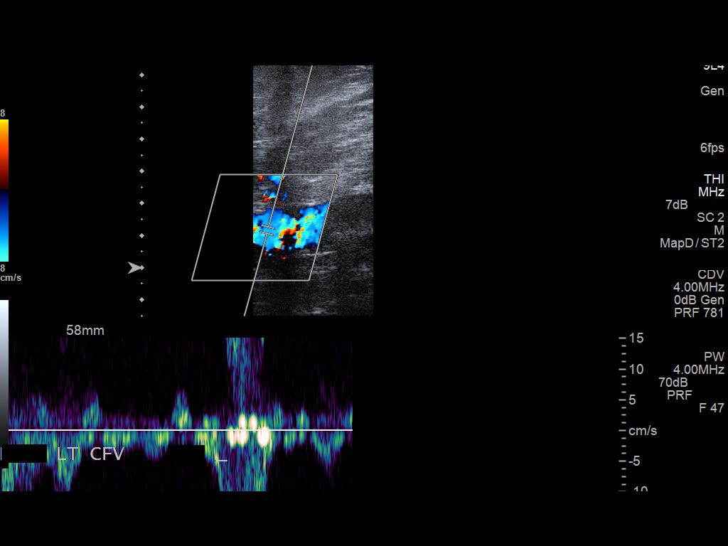
[im 44/54]
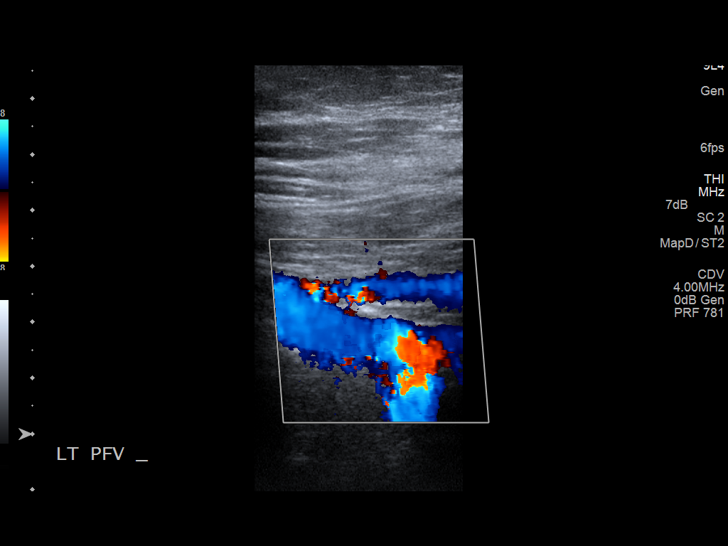
[im 49/54]
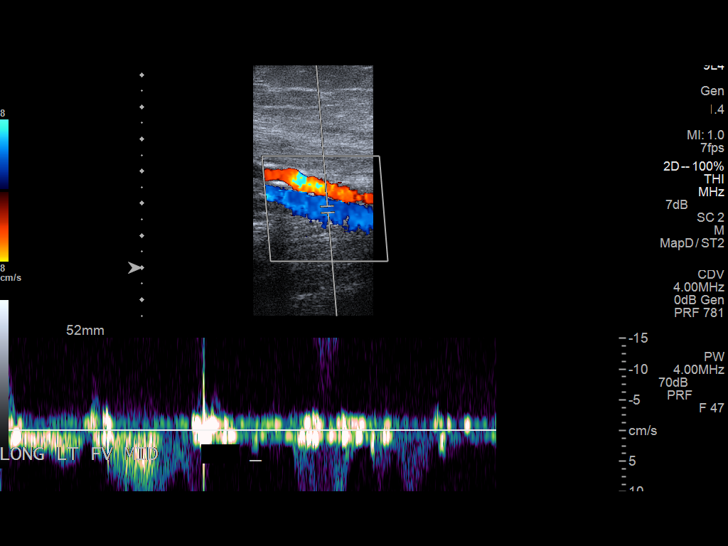
[im 54/54]
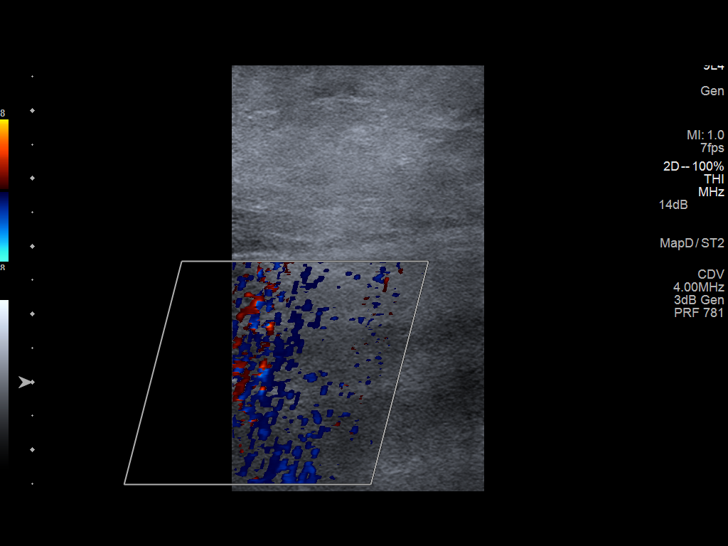

[13 of 24 positions shown; findings below may reference images not displayed]

FINDINGS: RIGHT LOWER EXTREMITY

Common Femoral Vein: No evidence of thrombus. Normal
compressibility, respiratory phasicity and response to augmentation.

Saphenofemoral Junction: No evidence of thrombus. Normal
compressibility and flow on color Doppler imaging.

Profunda Femoral Vein: No evidence of thrombus. Normal
compressibility and flow on color Doppler imaging.

Femoral Vein: No evidence of thrombus. Normal compressibility,
respiratory phasicity and response to augmentation.

Popliteal Vein: No evidence of thrombus. Normal compressibility,
respiratory phasicity and response to augmentation.

Calf Veins: Not visualized. Limited assessment secondary to edema
and body habitus.

Superficial Great Saphenous Vein: No evidence of thrombus. Normal
compressibility and flow on color Doppler imaging.

Venous Reflux:  None.

Other Findings:  None.

LEFT LOWER EXTREMITY

Common Femoral Vein: No evidence of thrombus. Normal
compressibility, respiratory phasicity and response to augmentation.

Saphenofemoral Junction: No evidence of thrombus. Normal
compressibility and flow on color Doppler imaging.

Profunda Femoral Vein: No evidence of thrombus. Normal
compressibility and flow on color Doppler imaging.

Femoral Vein: No evidence of thrombus. Normal compressibility,
respiratory phasicity and response to augmentation.

Popliteal Vein: No evidence of thrombus. Normal compressibility,
respiratory phasicity and response to augmentation.

Calf Veins: Not well visualized. Limited assessment secondary to
edema and body habitus.

Superficial Great Saphenous Vein: No evidence of thrombus. Normal
compressibility and flow on color Doppler imaging.

Venous Reflux:  None.

Other Findings:  None.
IMPRESSION: No significant occlusive femoral popliteal DVT in either extremity.

## 2015-01-15 ENCOUNTER — Encounter
Admit: 2015-01-15 | Disposition: A | Discharge status: Home / Self Care | Payer: Self-pay | Attending: Surgery | Admitting: Surgery

## 2015-01-23 ENCOUNTER — Ambulatory Visit
Admit: 2015-01-23 | Disposition: A | Discharge status: Home / Self Care | Payer: Self-pay | Attending: Radiation Oncology | Admitting: Radiation Oncology

## 2015-01-29 ENCOUNTER — Encounter: Payer: Self-pay | Admitting: Vascular Surgery

## 2015-01-29 ENCOUNTER — Other Ambulatory Visit: Payer: Self-pay | Admitting: *Deleted

## 2015-01-29 DIAGNOSIS — L97909 Non-pressure chronic ulcer of unspecified part of unspecified lower leg with unspecified severity: Secondary | ICD-10-CM

## 2015-01-29 DIAGNOSIS — L97919 Non-pressure chronic ulcer of unspecified part of right lower leg with unspecified severity: Principal | ICD-10-CM

## 2015-01-29 DIAGNOSIS — I83029 Varicose veins of left lower extremity with ulcer of unspecified site: Secondary | ICD-10-CM

## 2015-01-29 DIAGNOSIS — I83009 Varicose veins of unspecified lower extremity with ulcer of unspecified site: Secondary | ICD-10-CM

## 2015-01-29 DIAGNOSIS — L97929 Non-pressure chronic ulcer of unspecified part of left lower leg with unspecified severity: Secondary | ICD-10-CM

## 2015-01-29 DIAGNOSIS — I83019 Varicose veins of right lower extremity with ulcer of unspecified site: Secondary | ICD-10-CM

## 2015-01-30 ENCOUNTER — Other Ambulatory Visit: Payer: Self-pay | Admitting: Vascular Surgery

## 2015-01-30 ENCOUNTER — Ambulatory Visit (HOSPITAL_COMMUNITY)
Admission: RE | Admit: 2015-01-30 | Discharge: 2015-01-30 | Disposition: A | Discharge status: Home / Self Care | Payer: PRIVATE HEALTH INSURANCE | Source: Ambulatory Visit | Attending: Vascular Surgery | Admitting: Vascular Surgery

## 2015-01-30 ENCOUNTER — Ambulatory Visit (INDEPENDENT_AMBULATORY_CARE_PROVIDER_SITE_OTHER)
Admission: RE | Admit: 2015-01-30 | Discharge: 2015-01-30 | Disposition: A | Discharge status: Home / Self Care | Payer: PRIVATE HEALTH INSURANCE | Source: Ambulatory Visit | Attending: Vascular Surgery | Admitting: Vascular Surgery

## 2015-01-30 DIAGNOSIS — L97919 Non-pressure chronic ulcer of unspecified part of right lower leg with unspecified severity: Principal | ICD-10-CM

## 2015-01-30 DIAGNOSIS — L97909 Non-pressure chronic ulcer of unspecified part of unspecified lower leg with unspecified severity: Secondary | ICD-10-CM

## 2015-01-30 DIAGNOSIS — I83029 Varicose veins of left lower extremity with ulcer of unspecified site: Secondary | ICD-10-CM

## 2015-01-30 DIAGNOSIS — I83019 Varicose veins of right lower extremity with ulcer of unspecified site: Secondary | ICD-10-CM

## 2015-01-30 DIAGNOSIS — I83009 Varicose veins of unspecified lower extremity with ulcer of unspecified site: Secondary | ICD-10-CM

## 2015-01-30 DIAGNOSIS — L97929 Non-pressure chronic ulcer of unspecified part of left lower leg with unspecified severity: Secondary | ICD-10-CM

## 2015-02-04 ENCOUNTER — Encounter: Payer: Self-pay | Admitting: Vascular Surgery

## 2015-02-05 ENCOUNTER — Encounter: Payer: Self-pay | Admitting: Vascular Surgery

## 2015-02-05 ENCOUNTER — Ambulatory Visit (INDEPENDENT_AMBULATORY_CARE_PROVIDER_SITE_OTHER): Payer: PRIVATE HEALTH INSURANCE | Admitting: Vascular Surgery

## 2015-02-05 VITALS — BP 140/54 | HR 68 | Temp 98.1°F | Resp 18 | Ht 62.0 in | Wt 272.0 lb

## 2015-02-05 DIAGNOSIS — I872 Venous insufficiency (chronic) (peripheral): Secondary | ICD-10-CM | POA: Insufficient documentation

## 2015-02-05 DIAGNOSIS — I83022 Varicose veins of left lower extremity with ulcer of calf: Secondary | ICD-10-CM

## 2015-02-05 DIAGNOSIS — I83028 Varicose veins of left lower extremity with ulcer other part of lower leg: Secondary | ICD-10-CM

## 2015-02-05 DIAGNOSIS — I83025 Varicose veins of left lower extremity with ulcer other part of foot: Secondary | ICD-10-CM

## 2015-02-05 DIAGNOSIS — I83021 Varicose veins of left lower extremity with ulcer of thigh: Secondary | ICD-10-CM | POA: Diagnosis not present

## 2015-02-05 DIAGNOSIS — IMO0001 Reserved for inherently not codable concepts without codable children: Secondary | ICD-10-CM

## 2015-02-05 DIAGNOSIS — I83024 Varicose veins of left lower extremity with ulcer of heel and midfoot: Secondary | ICD-10-CM

## 2015-02-05 DIAGNOSIS — I83023 Varicose veins of left lower extremity with ulcer of ankle: Secondary | ICD-10-CM

## 2015-02-05 DIAGNOSIS — I83029 Varicose veins of left lower extremity with ulcer of unspecified site: Secondary | ICD-10-CM

## 2015-02-05 NOTE — Progress Notes (Signed)
[facility]  Patient name: Elizabeth [name] MRN: [id number] DOB: June 09, 1951 Sex: female  REASON FOR CONSULT: Venous stasis ulcer left leg  HPI: Elizabeth [name] is a 64 y.o. female who was referred with a nonhealing wound of the left leg.  I have reviewed the records [facility] dated 01/21/2015. The patient was being followed with 3 wounds. One of those wounds had healed but the the other 2 were still open. The patient was sent for vascular consultation after duplex scan suggested evidence of infrainguinal arterial occlusive disease.  On my history, I do not get any history of claudication or rest pain. He has been using compression dressings for the wounds and also elevating her legs. Leg elevation does not cause any rest pain.  She denies any previous history of DVT or phlebitis.   Past Medical History  Diagnosis Date  . Varicose veins   . Diabetes mellitus without complication   . Lymphedema   . Cancer     breast  . Stroke    Family History  Problem Relation Age of Onset  . Diabetes Mother   . Cancer Mother   . Diabetes Father   . Diabetes Sister   . Diabetes Brother    SOCIAL HISTORY: History  Substance Use Topics  . Smoking status: Never Smoker   . Smokeless tobacco: Not on file  . Alcohol Use: No   No Known Allergies Current Outpatient Prescriptions  Medication Sig Dispense Refill  . albuterol (PROVENTIL HFA;VENTOLIN HFA) 108 (90 BASE) MCG/ACT inhaler Inhale 2 puffs into the lungs every 6 (six) hours as needed for wheezing or shortness of breath.    [name] aspirin 81 MG tablet Take 81 mg by mouth daily.    [name] atenolol (TENORMIN) 50 MG tablet Take 50 mg by mouth daily.    . brimonidine (ALPHAGAN) 0.2 % ophthalmic solution Place 1 drop into the left eye 2 (two) times daily.    . Calcium-Vitamin D (CALTRATE 600 PLUS-VIT D PO) Take by mouth.    . gabapentin (NEURONTIN) 300 MG capsule Take 300 mg by mouth 3 (three)  times daily.    . insulin NPH-regular Human (NOVOLIN 70/30) (70-30) 100 UNIT/ML injection Inject into the skin 2 (two) times daily with a meal.    . letrozole (FEMARA) 2.5 MG tablet Take 2.5 mg by mouth daily.    [name] linagliptin (TRADJENTA) 5 MG TABS tablet Take 5 mg by mouth daily.    [name] lisinopril-hydrochlorothiazide (PRINZIDE,ZESTORETIC) 20-25 MG per tablet Take 1 tablet by mouth daily.    . metFORMIN (GLUCOPHAGE) 500 MG tablet Take 500 mg by mouth 2 (two) times daily with a meal.    . ondansetron (ZOFRAN) 4 MG tablet Take 4 mg by mouth every 8 (eight) hours as needed for nausea or vomiting.    . pravastatin (PRAVACHOL) 40 MG tablet Take 40 mg by mouth daily.    . ranitidine (ZANTAC) 150 MG tablet Take 150 mg by mouth 2 (two) times daily.    . timolol (BETIMOL) 0.5 % ophthalmic solution Place 1 drop into the left eye 2 (two) times daily.    [name] CALAMINE-ZINC OXIDE EX Apply topically.     No current facility-administered medications for this visit.   REVIEW OF SYSTEMS: Elizabeth [name] ] denotes positive finding; [  ] denotes negative finding  CARDIOVASCULAR:  [ ]  chest pain   [ ]  chest pressure   [ ]  palpitations   [ ]   orthopnea   [ ]  dyspnea on exertion   [ ]  claudication   [ ]  rest pain   [ ]  DVT   Elizabeth [name] ] phlebitis PULMONARY:   [ ]  productive cough   [ ]  asthma   [ ]  wheezing NEUROLOGIC:   [ ]  weakness  [ ]  paresthesias  [ ]  aphasia  [ ]  amaurosis  [ ]  dizziness HEMATOLOGIC:   [ ]  bleeding problems   [ ]  clotting disorders MUSCULOSKELETAL:  [ ]  joint pain   [ ]  joint swelling Elizabeth [name] ] leg swelling GASTROINTESTINAL: [ ]   blood in stool  [ ]   hematemesis GENITOURINARY:  [ ]   dysuria  [ ]   hematuria PSYCHIATRIC:  [ ]  history of major depression INTEGUMENTARY:  [ ]  rashes  [ ]  ulcers CONSTITUTIONAL:  [ ]  fever   [ ]  chills  PHYSICAL EXAM: Filed Vitals:   02/05/15 1355  BP: 140/54  Pulse: 68  Temp: 98.1 F (36.7 C)  TempSrc: Oral  Resp: 18  Height: 5\' 2"  (1.575 m)  Weight: 272 lb (123.378 kg)  SpO2: 98%    GENERAL: The patient is a well-nourished female, in no acute distress. The vital signs are documented above. CARDIOVASCULAR: There is a regular rate and rhythm. I do not detect carotid bruits. She has moderate bilateral lower extremity swelling. She has palpable femoral pulses. I cannot palpate popliteal or pedal pulses. PULMONARY: There is good air exchange bilaterally without wheezing or rales. ABDOMEN: Soft and non-tender with normal pitched bowel sounds.  MUSCULOSKELETAL: There are no major deformities or cyanosis. NEUROLOGIC: No focal weakness or paresthesias are detected. SKIN: She has some hyperpigmentation bilaterally consistent with chronic venous insufficiency. There are no open ulcers in the right leg. There is one small 5 mm ulcer on the anterior lateral aspect of her left leg just below her knee. This has essentially healed. PSYCHIATRIC: The patient has a normal affect.  DATA:  I have reviewed the lower extremity arterial duplex scan which was performed on 01/30/2015. This showed diffuse calcification bilaterally in the lower extremities. Waveforms in the groins showed evidence of possible inflow disease.  I have also reviewed the venous duplex scan from 01/30/2015. This shows no evidence of DVT. On the left side there was reflux in the common femoral vein and femoral vein. There was also some reflux in the right greater saphenous vein around the knee. On the right side, there was no evidence of DVT. There was some reflux in the great saphenous vein and small saphenous vein.  I did interrogate with a Doppler myself and she had biphasic dorsalis pedis signals bilaterally with monophasic posterior tibial signals. The posterior tibial signals were more difficult to assess because of her leg swelling.  MEDICAL ISSUES:  VENOUS STASIS ULCERS:  Her venous stasis ulcers at this point have healed with exception of the very small ulcer just below her left knee. Based on my assessment I think  she has adequate arterial flow although she does have some evidence of calcific disease based on her duplex. Regardless, given biphasic Doppler signals in the dorsalis pedis positions I do not think that any further workup for arterial disease is necessary at this point. I think it is safe for her to use mild compression stockings and elevate her legs. I have written her a prescription for knee-high compression stockings with a gradient of 15-20 mmHg and have encouraged her to wear these when she is on her feet a lot. I be happy  to see her back at any time if any new vascular issues arise.   [place] [facility] Beeper: [phone]

## 2015-02-07 ENCOUNTER — Encounter
Admit: 2015-02-07 | Disposition: A | Discharge status: Home / Self Care | Payer: Self-pay | Attending: Surgery | Admitting: Surgery

## 2015-02-21 ENCOUNTER — Other Ambulatory Visit: Payer: Self-pay | Admitting: Oncology

## 2015-02-21 DIAGNOSIS — Z853 Personal history of malignant neoplasm of breast: Secondary | ICD-10-CM

## 2015-02-21 DIAGNOSIS — Z79891 Long term (current) use of opiate analgesic: Secondary | ICD-10-CM

## 2015-02-21 DIAGNOSIS — M949 Disorder of cartilage, unspecified: Secondary | ICD-10-CM

## 2015-02-25 ENCOUNTER — Ambulatory Visit
Admit: 2015-02-25 | Disposition: A | Discharge status: Home / Self Care | Payer: Self-pay | Attending: Radiation Oncology | Admitting: Radiation Oncology

## 2015-02-28 NOTE — Op Note (Signed)
PATIENT NAME:  Elizabeth [name], Elizabeth [name] MR#:  [id number] DATE OF BIRTH:  February 18, 1951  DATE OF PROCEDURE:  02/28/2013  PREOPERATIVE DIAGNOSIS:  1.  Proliferative retinopathy, both eyes.  2.  Neovascular glaucoma, left eye.  3.  Tractional retinal detachment, left eye.   POSTOPERATIVE DIAGNOSIS:    1.  Proliferative retinopathy, both eyes.  2.  Neovascular glaucoma, left eye.  3.  Tractional retinal detachment, left eye.   PROCEDURES PERFORMED:   1.  Panretinal photocoagulation, right eye, performed and dictated separately by Dr. Starling Manns. 2.  Posterior vitrectomy with endolaser and retinal detachment repair, left eye, performed by Dr. Starling Manns and dictated separately.   3.  Ahmed glaucoma valve placement with Tutoplast, left eye.  SURGEON: Leandrew Koyanagi, M.D.   ANESTHESIA: General anesthesia augmented with retrobulbar block of Xylocaine and bupivacaine.  IMPLANTS:  Ahmed glaucoma valve model FP7, serial [id number].  Tutoplast allograft, lot [id number], reference [phone].   COMPLICATIONS: None.   DESCRIPTION OF PROCEDURE: The patient was identified and transported to the operating room and placed in the supine position. General endotracheal anesthesia was induced without complication. Dr. Starling Manns performed panretinal photocoagulation of the right eye and this is dictated separately. The left eye was then prepped in the usual sterile ophthalmic fashion. Dr. Starling Manns performed a posterior vitrectomy, endolaser and tractional retinal detachment repair on the left eye and that is dictated by Dr. Starling Manns separately.   The left eye was inspected and there was a conjunctival peritomy present from the 12 o'clock to 3 o'clock position. There was a gas bubble present in the vitreous cavity and the anterior chamber was deep and was filled with Healon-5 viscoelastic. The eye had a low physiologic pressure. The pupil was dilated.   The conjunctival peritomy was extended with posterior  relaxing incisions at the 12 and 3 o'clock positions. This was dissected posteriorly to create a scleral pocket to place the plate of the Ahmed valve. Hemostasis was achieved with wet-field cautery. A muscle hook was used to isolate the lateral rectus muscle. A 5-0 silk suture was used to place a bridle suture through this muscle. The superior rectus muscle was then isolated using a muscle hook. A 5-0 silk suture was used to place a bridle suture underneath this muscle. The eye was placed into downward traction in adduction. The scleral pocket was further dissected and a position was marked 10 mm posterior to the limbus. Two 8-0 nylon sutures were placed 10 mm posterior to the limbus for positioning of the Ahmed glaucoma valve.   Attention was turned to the prep table where the Ahmed glaucoma valve was opened and was primed using balanced salt solution. It appeared to be in good working condition. This was transferred to the operative site and the Ahmed valve was placed into the scleral pocket underneath Tenons and conjunctiva in the superotemporal quadrant. The 2 preplaced 8-0 nylon sutures were used to secure the anterior portion of the plate to the sclera. This was checked and noted to be approximately 9 mm to 9.5 mm posterior to the limbus. The anterior portion of the valve with the silicone tube was measured and cut with an anterior bevel. A 23-gauge blade was used to enter the anterior chamber at the 1:30 position.  The tube was inserted through this opening and positioned in a vertical orientation outside the visual axis. This was noted to be in the middle of the anterior chamber without iris or corneal touch. The posterior portion of the tube was secured to the  sclera with 2 interrupted 8-0 Vicryl sutures. The Tutoplast, which had been prepared and reconstituted using balanced salt solution and Vigamox, was placed onto the eye. It was trimmed, so as to cover the entire length of the tube from the limbus to  the sutured plate of the valve. It was secured with 4 interrupted 8-0 nylon sutures. Some balanced salt solution was used to flush some Healon-5 from the anterior chamber. The eye maintained a physiologic pressure without shallowing. Additional Healon-5 viscoelastic was placed in the anterior chamber.   The conjunctiva was closed using running 9-0 Vicryl suture. The anterior chamber was again irrigated with balanced salt solution to ensure patency and proper function of the tube. The eye maintained a physiologic pressure without shallowing. The eye was left with an approximately 50% fill of Healon-5 viscoelastic. The paracentesis incision at the 3 o'clock position was hydrated with balanced salt solution. There were no wound leaks.   Additionally, the corneal epithelium had become abraded and was noted to be loose over the entire corneal surface. A Weck-Cel was used to debride the corneal epithelium to about 2 mm from the limbus. A 25-gauge needle was used to perform stromal micropuncture in the peripheral cornea 360 degrees to ensure healing of the epithelium and decreased likelihood of recurrent corneal erosion and/or abrasion.  Then, 0.5 mL of subconjunctival dexamethasone was placed in the infranasal quadrant. Topical Vigamox drops and atropine drops were placed onto the eye followed by Maxitrol ointment. The eye was patched and shielded.     ____________________________ Wyonia Hough, MD crb:ct D: 02/28/2013 14:16:46 ET T: 02/28/2013 14:32:00 ET JOB#: [id number]  cc: Wyonia Hough, MD, <Dictator> Leandrew Koyanagi MD ELECTRONICALLY SIGNED 03/07/2013 11:35

## 2015-02-28 NOTE — Op Note (Signed)
PATIENT NAME:  Elizabeth [name], Elizabeth [name] MR#:  [id number] DATE OF BIRTH:  30-Apr-1951  DATE OF PROCEDURE:  02/28/2013  PROCEDURES PERFORMED:  1. Pars plana vitrectomy of the left eye.  2. Tractional retinal detachment repair of the left eye.  3. Ahmed valve implant fistulizing surgery, left eye.  4. Panretinal photocoagulation of the right eye.   PREOPERATIVE DIAGNOSES:  1. Proliferative diabetic retinopathy.  2. Tractional retinal detachment.  3. Neovascular glaucoma, left eye.  4. Proliferative diabetic retinopathy, right eye.  POSTOPERATIVE DIAGNOSES: 1. Proliferative diabetic retinopathy.  2. Tractional retinal detachment.  3. Neovascular glaucoma, left eye.  4. Proliferative diabetic retinopathy, right eye.  ESTIMATED BLOOD LOSS: Less than 1 mL.   PRIMARY SURGEONS:  1. Primary surgeon for vitrectomy and tractional retinal detachment repair is Garlan Fair, MD. 2. Primary surgeon for Ahmed valve implantation is Leandrew Koyanagi, MD. 3. Primary surgeon for panretinal photocoagulation of right eye,  Garlan Fair, MD.  ESTIMATED BLOOD LOSS: Less than 1 mL.   COMPLICATIONS: None.  ANESTHESIA: General endotracheal anesthesia with a supplemental retrobulbar block of the left eye.   INDICATIONS FOR PROCEDURE: This is a patient who presented to our office status post vitrectomy at a prior location. The patient had neovascularization of the iris and elevated intraocular pressures with closed angle secondary to neovascularization. Examination of the posterior revealed a superior tractional retinal detachment that extended out to the ora serrata. Risks, benefits and alternatives of the above procedure were discussed, and the patient wished to proceed.   Examination also revealed progression of proliferative diabetic retinopathy in the right eye.   DETAILS OF PROCEDURE: After informed consent was obtained, the patient was brought into the operative suite at [facility]. The patient was placed in supine position and was induced by the anesthesia team without complications. The right eye was opened with lid speculum, and 360 degrees of panretinal photocoagulation was performed by Garlan Fair, MD, without complication. Attention was turned to the left eye. The left eye was given a retrobulbar block without any complication. The left eye was prepped and draped in sterile manner. After lid speculum was inserted, a 90 degree conjunctival peritomy was created superotemporally. A 23-gauge trocar was placed inferotemporally through displaced conjunctiva in an oblique fashion 3 mm beyond the limbus. The infusion cannula was turned on and inserted through the trocar and secured in position with Steri-Strips. Two more trocars were placed in a similar fashion superotemporally and superonasally. Vitreous cutter and light pipe were introduced into the eye, and the vitreous was confirmed as mostly removed. 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 This was peeled away from the retina, and the retina was noted to loosen. The peripheral vitreous was then trimmed to the ora serrata. A small draining retinotomy was created at approximately 11 o'clock at the posterior aspect of the retinal detachment. All breaks were marked using Endo cautery. An air-fluid exchange was performed, and endolaser was introduced. The retinal detachment was completely covered with laser. Four rows of laser were placed around each of the marked tears. Remnant fluid was then removed, and 20% SF6 was used as an Acupuncturist. The trocars were removed. Superotemporal sclerotomy was closed using 7-0 Vicryl. The other 2 sclerotomies were closed using 6-0 plain gut. A small temporal corneal wound was created, and Healon 5 was injected into the anterior chamber in order to maintain it during the course of Ahmed valve placement. The Ahmed  valve implantation was performed  by Dr. Wallace Going, and this will be dictated separately.    ____________________________ Teresa Pelton. Starling Manns, MD mfa:OSi D: 02/28/2013 13:05:01 ET T: 02/28/2013 13:22:26 ET JOB#: [id number]  cc: Teresa Pelton. Starling Manns, MD, <Dictator> Coralee Rud MD ELECTRONICALLY SIGNED 03/23/2013 13:05

## 2015-02-28 NOTE — Op Note (Signed)
PATIENT NAME:  Elizabeth [name], Elizabeth [name] MR#:  [id number] DATE OF BIRTH:  July 18, 1951  DATE OF PROCEDURE:  06/27/2013  PROCEDURES PERFORMED: 1.  Pars plana vitrectomy of the left eye.  2.  Retinal detachment repair of the left eye.   PREOPERATIVE DIAGNOSIS:  Repeat tractional retinal detachment.  POSTOPERATIVE DIAGNOSIS:  Repeat tractional retinal detachment.   ESTIMATED BLOOD LOSS:  Less than 1 mL  PRIMARY SURGEON:  Garlan Fair, MD.   ANESTHESIA:  Retrobulbar block of the left eye with monitored anesthesia care.   COMPLICATIONS: None.   INDICATIONS FOR PROCEDURE: The patient presented to my office, status post retinal detachment repair some time ago. The patient had developed decreased vision in the left eye and examination revealed proliferative vitreoretinopathy that had lifted the retina off and was now (Dictation Anomaly) Macula off. The patient noted that she loss vision multiple weeks before. Risks, benefits and alternatives of the above procedure were discussed and the patient wished to proceed.   DETAILS OF PROCEDURE:  After informed consent was obtained, the patient was brought into the operative suite at [facility]. The patient was placed in the supine position, was given a small dose of Alfenta and a retrobulbar block was performed in the left eye by the primary surgeon without any complications. The left eye was prepped and draped in sterile manner. After lid speculum was inserted, a 25-gauge trocar was placed inferotemporally through displaced conjunctiva in oblique fashion 3 mm beyond the limbus. Infusion cannula was turned on and inserted through the trocar and secured in position with Steri-Strips. Two more trocars were placed superonasally and superotemporally with care taken to avoid the scleral patch graft that was superotemporal. The light pipe and vitreous cutter were introduced into the eye and peripheral vitreous was investigated and trimmed. The  intraocular forceps were introduced and 2 areas of proliferative vitreoretinopathy were peeled away and the posterior retina visibly relaxed. During the course of the peel of the second piece, a small hole opened up at approximately 3 o'clock.  This was used as a posterior draining retinotomy. An air-fluid exchange was performed through this hole and the retina flattened. Endolaser was introduced and 3 rows of laser were placed around the hole and posterior to the prior laser in order to seal the posterior retina from the periphery. Peripheral scatter laser was performed to augment the previous surgery's laser. Any remnant fluid was removed. 14% C3F8 was then used as an air gas exchange and the trocars were removed. The superonasal trocar was closed using transconjunctival 6-0 plain gut. The eye was pressurized with C3F8 to 15 mmHg. All the wounds were noted to be airtight. 5 mg of dexamethasone was given into the inferior fornix and the lid speculum was removed. The eye was cleaned and TobraDex was placed on the eye. A patch and shield was placed over the eye and the patient was taken to postanesthesia care with instructions to remain face down.     ____________________________ Teresa Pelton. Starling Manns, MD mfa:ce D: 06/27/2013 11:21:17 ET T: 06/27/2013 12:26:49 ET JOB#: [id number]  cc: Teresa Pelton. Starling Manns, MD, <Dictator> Coralee Rud MD ELECTRONICALLY SIGNED 06/30/2013 10:50

## 2015-03-01 NOTE — Op Note (Signed)
PATIENT NAME:  Elizabeth [name], Elizabeth [name] MR#:  [id number] DATE OF BIRTH:  15-Nov-1950  DATE OF PROCEDURE:  05/02/2014  PREOPERATIVE DIAGNOSIS: Carcinoma of the right breast.   POSTOPERATIVE DIAGNOSIS:  Carcinoma of the right breast.   PROCEDURE: Right partial mastectomy with axillary sentinel lymph node biopsy.   SURGEON: Rochel Brome, MD  ANESTHESIA: General.   INDICATIONS: This 64 year old female recently had a mammogram depicting a 7-8 mm density in the inferior aspect of the right breast at the 7 o'clock position. This is seen on mammography but not on ultrasound. She did have a stereotactic needle biopsy demonstrating invasive mammary carcinoma. Surgery was recommended for definitive treatment.   DESCRIPTION OF PROCEDURE: The patient was placed on the operating table in the supine position under general anesthesia. The dressing was removed from the inferior aspect of the right breast exposing the Kopans wire, which entered the breast just about 1.5 cm above the inferior mammary fold at approximately 7 o'clock position.  I reviewed the mammogram images demonstrating the proximity of the Kopans wire to the needle biopsy marker. She also had received an injection of radioactive technetium sulfur colloid and the gamma counter was used demonstrating some radioactivity in the axilla. The dressing was removed from the anterior aspect of the right breast and the Kopans wire was cut 2 cm from the skin. The breast was prepared with ChloraPrep, draped in a sterile manner.   An elliptical excision was carried out from the 6 o'clock to the 8 o'clock position of the breast with the skin of approximately 12 mm, which was kept in contact with the underlying breast tissue. Electrocautery was used for hemostasis. Dissection was carried down around the Kopans wire. There was some palpable firmness at the biopsy site which helped to direct the extent of the excision.  It is noted that there was copious bleeding from the  lateral end of the incision. There was a vessel which was suture ligated with 3-0 chromic and resolved. The portion of tissue was excised. The margin maps were used to suture to the tissue marking the medial, lateral, cranial, caudal, and deep margins. The 8 o'clock end of the skin ellipse was tagged with 3-0 nylon stitch.  A specimen was submitted for a specimen mammogram and pathology.   Attention was turned to the right axilla which was further probed with a gamma counter demonstrating some radioactivity in the inferior aspect of the axilla. An oblique incision was made approximately 6 cm in length, carried down through the subcutaneous tissues. The patient was very obese and extended down far into the fat and incised the superficial fascia, dissected down deeply within the axilla, and detected radioactivity and found a mass which was approximately 2.5 cm in dimension which was mostly fatty and felt firm within, was dissected free from surrounding structures. Its vascular pedicle was suture ligated with 3-0 chromic and was excised. The ex vivo count was in the range of 8-15 counts per second. This was submitted as a sentinel lymph node.   There was additional radioactivity found and another palpable lymph node in the inferior aspect of the axilla which was also resected with its surrounding fatty tissue. Ex vivo count was in the range of 7-12. The background count was minimal. The wound was inspected.  It is noted that during the course of this excision, there was also a bleeding point which was suture ligated with 3-0 chromic. Following this hemostasis was intact, as both wounds were examined. It is noted that  during the course of the procedure, the radiologist called to report that the specimen mammogram demonstrated the biopsy marker within the specimen. Later, the pathologist called back to report that the margins appeared to be satisfactory. Both wounds were inspected and hemostasis was intact. The  breast wound was closed with interrupted 4-0 chromic inverted sutures in the subcutaneous tissues. Next, the skin was closed with running 4-0 Monocryl subcuticular suture. The axillary wound was closed with running 4-0 Monocryl subcuticular suture. Both wounds were then dressed with Dermabond.   The patient appeared to tolerate the procedure satisfactorily and was prepared for a transfer to the recovery room.    ____________________________ Lenna Sciara. Rochel Brome, MD jws:dd D: 05/02/2014 15:54:06 ET T: 05/02/2014 20:18:54 ET JOB#: [id number]  cc: Loreli Dollar, MD, <Dictator> Loreli Dollar MD ELECTRONICALLY SIGNED 05/03/2014 17:02

## 2015-03-01 NOTE — Consult Note (Signed)
Reason for Visit: This 64 year old Female patient presents to the clinic for initial evaluation of  breast cancer .   Referred by Dr. Tamala Julian.  Diagnosis:  Chief Complaint/Diagnosis   64 year old female status post wide local excision sentinel node biopsy for stage I (T1 B. 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  Pathology Report pathology report reviewed   Imaging Report mammograms reviewed   Referral Report clinical notes reviewed   Planned Treatment Regimen adjuvant whole breast radiation plus aromatase inhibitor   HPI   patient is a 64 year old female who presents with an abnormal mammogram of the right breast showing an 8 mm lesion in the outer lower right breast new since 2014 confirmed on ultrasound and recommendation for stereotactic guided biopsy which was positive for invasive mammary carcinoma. Did not have a wide local excision for a 7 mm grade 2 overall invasive mammary carcinoma. 2 sentinel lymph nodes were negative. Margins were clear at 6 mm. Tumor was ER/PR positive and HER-2/neu not over expressed. She's been seen by medical oncology and no recommendation for systemic therapy was made. She is seen today for radiation oncology opinion. She is doing well. She specifically denies breast tenderness cough or bone pain.  Past Hx:    breast cancer:    Hyperlipidemia:    HTN:    CVA:    diabetes:    breast surg:   Past, Family and Social History:  Past Medical History positive   Cardiovascular hyperlipidemia; hypertension   Endocrine diabetes mellitus   Neurological/Psychiatric CVA   Past Surgical History breast reduction surgery   Family History noncontributory   Social History noncontributory   Additional Past Medical and Surgical History seen accompanied byby husband and daughter today   Allergies:   No Known Allergies:   Home Meds:  Home  Medications: Medication Instructions Status  atenolol 50 mg oral tablet 1 tab(s) orally once a day (at bedtime) Active  metFORMIN 500 mg oral tablet 2 tab(s) orally 2 times a day Active  Aspir 81 81 mg oral tablet 1 tab(s) orally once a day, held since Sunday 6/14 Active  pravastatin 40 mg oral tablet 1 tab(s) orally once a day (at bedtime) Active  Novolin 70/30 30 unit(s) subcutaneous once a day (at bedtime) Active  Zantac 150 150 mg oral tablet 1 tab(s) orally 2 times a day Active  gabapentin 300 mg oral capsule 1 tab(s) orally once a day (in the morning) Active  gabapentin 300 mg oral capsule 2 cap(s) orally once a day (in the evening) Active  glimepiride 2 mg oral tablet 1 tab(s) orally once a day (in the morning) Active  hydrochlorothiazide-lisinopril 25 mg-20 mg oral tablet 1 tab(s) orally once a day (in the morning) Active  Novolin 70/30 60 unit(s) subcutaneous once a day Active  Lotemax 0.5% ophthalmic gel 1 drop(s) to each affected eye 4 times a day Active  Ilevro 0.3% ophthalmic suspension 1 drop(s) to each affected eye once a day .[name]..the left eye Active  Proventil CFC free 90 mcg/inh inhalation aerosol 2 puff(s) inhaled 4 times a day, As Needed - for Wheezing Active   Review of Systems:  General negative   Performance Status (ECOG) 0   Skin negative   Breast see HPI   Ophthalmologic negative   ENMT negative   Respiratory and Thorax negative   Cardiovascular negative   Gastrointestinal negative   Genitourinary negative   Musculoskeletal negative  Neurological negative   Psychiatric negative   Hematology/Lymphatics negative   Endocrine negative   Allergic/Immunologic negative   Review of Systems   review of systems obtained from nurses notes  Nursing Notes:  Nursing Vital Signs and Chemo Nursing Nursing Notes: *CC Vital Signs Flowsheet:   09-Jul-15 11:20  Temp Temperature 98.5  Pulse Pulse 64  Respirations Respirations 18  SBP SBP 108  DBP DBP  72  Pain Scale (0-10)  0  Current Weight (kg) (kg) 125.4  Height (cm) centimeters 160  BSA (m2) 2.2   Physical Exam:  General/Skin/HEENT:  General normal   Skin normal   Eyes normal   ENMT normal   Head and Neck normal   Additional PE well-developed morbidly obese female in NAD. She has bilateral breast surgery for breast reduction noted. She has a wide local excision and sentinel node incision both are healing well. No dominant mass or nodularity is noted in either breast in 2 positions examined. Lungs are clear to A&P cardiac examination shows regular rate and rhythm.   Breasts/Resp/CV/GI/GU:  Respiratory and Thorax normal   Cardiovascular normal   Gastrointestinal normal   Genitourinary normal   MS/Neuro/Psych/Lymph:  Musculoskeletal normal   Neurological normal   Psychiatric normal   Lymphatics normal   Other Results:  Radiology Results: LabUnknown:    27-May-15 09:42, Digital Additional Views Rt Breast (SCR)  PACS Image     27-May-15 10:07, MAM [place] Right Limited  PACS [place]:    27-May-15 09:42, Digital Additional Views Rt Breast (SCR)  Digital Additional Views Rt Breast (SCR)   REASON FOR EXAM:    av rt asymmetry  COMMENTS:       PROCEDURE: MAM - MAM DIG ADDVIEWS RT SCR  - Apr 03 2014  9:42AM     CLINICAL DATA:  64 year old female with possible right breast mass  on screening mammogram    EXAM:  DIGITAL DIAGNOSTIC  RIGHT MAMMOGRAM    ULTRASOUND RIGHT BREAST    COMPARISON:  03/27/2014 and prior mammograms dating back to  09/16/2004  ACR Breast Density Category b: There are scattered areas of  fibroglandular density.    FINDINGS:  Spot compression views of the right breast demonstrate an 8 mm  obscured mass within the slightly outer lower right breast, new  since 2014.    On physical exam, no palpable abnormalities identified within the  outer or lower right breast    Ultrasound is performed, showing no evidence of solid  or cystic  mass, distortion or abnormal areas of shadowing within the outer,  central or lower right breast.   IMPRESSION:  Indeterminate 8 mm obscured mass without palpable or definite  sonographic correlate. Given that this is new since 2014, tissue  sampling is recommended.    RECOMMENDATION:  Stereotactic guided right breast biopsy, which has been scheduled  for 04/08/2014.    I have discussed the findings and recommendations with the patient.  Results were also provided in writing at the conclusion of the  visit. If applicable, a reminder letter will be sent to the patient  regarding the next appointment.    BI-RADS CATEGORY  4: Suspicious.  Electronically Signed    By: Hassan Rowan M.D.    On: 04/03/2014 13:59         Verified By: Lura Em, M.D.,   Relevent Results:   Relevant Scans and Labs mammograms and ultrasound reviewed   Assessment and Plan: Impression:   stage  I ER/PR positive invasive mammary carcinoma right breast status post wide local excision and sentinel node biopsy in 64 year old female Plan:   at this time I have recommended whole breast radiation therapy. Would plan on delivering 5000 cGy over 5 weeks boosting her scar another 1400 cGy using electron beam. Risks and benefits of treatment including skin reaction, fatigue, possible occlusion of superficial lung, all were explained in detail to the patient. She seems to comprehend my treatment plan well. I have set her up for CT simulation early next week. Patient will also be candidate for Romig ACE inhibitor after completion of radiation and will allow the medical oncologist to prescribe that.  I would like to take this opportunity for allowing me to participate in the care of your patient..  CC Referral:  cc: Dr. Tamala Julian, Dr. Delight Stare   Electronic Signatures: Baruch Gouty, Roda Shutters (MD)  (Signed 09-Jul-15 12:17)  Authored: HPI, Diagnosis, Past Hx, PFSH, Allergies, Home Meds, ROS, Nursing Notes, Physical  Exam, Other Results, Relevent Results, Encounter Assessment and Plan, CC Referring Physician   Last Updated: 09-Jul-15 12:17 by Armstead Peaks (MD)

## 2015-03-09 NOTE — H&P (Signed)
PATIENT NAME:  Elizabeth [name], Elizabeth [name] MR#:  [id number] DATE OF BIRTH:  Sep 16, 1951  DATE OF ADMISSION:  01/06/2015  REFERRING PHYSICIAN:  Valli Glance. Owens Shark, MD  PRIMARY CARE PHYSICIAN:  miles  CHIEF COMPLAINT:  Leg swelling.   HISTORY OF PRESENT ILLNESS:  A 64 year old Caucasian female with a history of hyperlipidemia, unspecified; essential hypertension; and type 2 diabetes, insulin requiring, complicated by peripheral neuropathy, presenting with leg swelling. She describes worsening edema for "a few weeks, and it comes and goes." She now has weeping edema for the last 3 or 4 days with associated blisters, right lower extremity worse than left with surrounding erythema and serosanguineous discharge. Denies any fevers or chills. She does complain of some pain of the right leg described only as "pain," intensity 4 to 5 out of 10. No worsening or relieving factors. Thus, she presented to the hospital for further workup and evaluation.   REVIEW OF SYSTEMS: CONSTITUTIONAL:  Denies fevers, chills, fatigue, or weakness.  EYES:  Denies blurred vision, double vision, or eye pain.  EARS, NOSE, AND THROAT:  Denies tinnitus, ear pain, or hearing loss.  RESPIRATORY:  Denies cough, wheeze, or shortness of breath.  CARDIOVASCULAR:  Denies chest pain or palpitations. Positive for edema.  GASTROINTESTINAL:  Denies nausea, vomiting, diarrhea, or abdominal pain.   GENITOURINARY:  Positive for dysuria as well as increased urinary frequency.  ENDOCRINE:  Denies nocturia or thyroid problems.  HEMATOLOGIC AND LYMPHATIC:  Denies easy bruising or bleeding.  SKIN:  Positive for erythematous rash with open leg wound of right lower extremity. No further lesions.  MUSCULOSKELETAL:  No pain in neck, back, shoulder, knees, hips or arthritic symptoms.  NEUROLOGIC:  Denies paralysis or paresthesias.  PSYCHIATRIC:  Denies anxiety or depressive symptoms.   Otherwise, a full review of systems performed by me is negative.   PAST  MEDICAL HISTORY:  Includes hyperlipidemia, unspecified; hypertension, essential; and type 2 diabetes, insulin-requiring, complicated by peripheral neuropathy.   SOCIAL HISTORY:  Denies alcohol or tobacco  She uses a cane for ambulation.   FAMILY HISTORY:  Denies cardiovascular or pulmonary disorders.   ALLERGIES:  No known drug allergies.   HOME MEDICATIONS:  Include aspirin 81 mg p.o. daily, gabapentin 600 mg in the evening and 300 mg in the morning, glimepiride 2 mg p.o. daily, Novolin 70/30 with 55 units in the morning and 30 units in the evening, Cogentin 5 mg p.o. daily, pravastatin 40 mg p.o. at bedtime, hydrochlorothiazide/lisinopril 25/20 mg p.o. daily, letrozole 2.5 mg p.o. daily, atenolol 50 mg p.o. daily, Proventil 90 mcg inhalation 2 puffs 4 times a day as needed for shortness of breath, Zantac 150 mg p.o. b.i.d., Lotemax 0.5% ophthalmic gel to affected eye 4 times daily, Ilevro 0.3% ophthalmic solution to each affected eye once daily.   PHYSICAL EXAMINATION: VITAL SIGNS:  Heart rate 63, respirations 18, blood pressure 144/77, saturating 97% on room air, weight 127 kg, BMI 49.6.  GENERAL:  Obese disheveled Caucasian female currently in no acute distress.  HEAD:  Normocephalic, atraumatic.  EYES:  Pupils are equal, round, and reactive to light. Extraocular muscles are intact. No scleral icterus.  MOUTH:  Moist mucous membranes. Dentition is intact. No abscess noted.  EARS, NOSE, AND THROAT:  Clear without exudates. No external lesions.  NECK:  Supple. No thyromegaly. No discernible JVD, though somewhat difficult secondary to body habitus.  PULMONARY:  Diminished breath sounds throughout all lung fields secondary to poor respiratory effort; however, no frank wheezes, rales, or rhonchi. No  use of accessory muscles. Poor respiratory effort as stated above.  CHEST:  Nontender to palpation.  CARDIOVASCULAR:  S1 and S2, regular rate and rhythm. No murmurs, rubs, or gallops. There is 3+ edema  to the knees bilaterally. Pedal pulses 2+ bilaterally.  GASTROINTESTINAL:  Soft, obese, nontender, nondistended. No masses. Positive bowel sounds. No hepatosplenomegaly.  MUSCULOSKELETAL:  No swelling or clubbing. Positive for edema as described above. Range of motion is full in all extremities.  NEUROLOGIC:  Cranial nerves II through XII are intact. No gross focal neurological deficits. Sensation is intact. Reflexes are intact.  SKIN:  On the right lower extremity shin there is an approximately 2 x 3 cm oval lesion, which is probably a ruptured blister with some serosanguineous discharge surrounded by erythema, which is warm to touch. No further lesions, rashes, or cyanosis. Skin is warm and dry. Turgor is intact.  PSYCHIATRIC:  Mood and affect are within normal limits. The patient is awake, alert, and oriented x 3. Insight and judgment are intact.   LABORATORY DATA:  Sodium is 137, potassium 3.7, chloride 98, bicarbonate 32, BUN 20, creatinine 0.88, glucose 228. WBC is 9.5, hemoglobin 11.8, platelets 238,000. Urinalysis:  WBCs 17, RBCs 2, leukocyte esterase 2+, epithelial cells 1.   ASSESSMENT AND PLAN:  A 64 year old Caucasian female with history of hyperlipidemia, unspecified; hypertension, essential; and type 2 diabetes, insulin requiring, complicated by neuropathy, presenting with leg edema.   1.  Right lower extremity cellulitis. She received vancomycin in the Emergency Department. Continue this and also consult wound therapy for opened leg wound.  2.  Urinary tract infection, site unspecified, ceftriaxone 3.  Edema. It appears that she has not had a recent workup for this. We will have to check a transthoracic echocardiogram, checking her heart function; however, has no further symptomatology such as congestive heart failure.  3.  Type 2 diabetes, insulin-requiring, complicated by neuropathy. Continue with her 70/30 and insulin sliding scale with q. 6 hour Accu-Cheks.  4.  Venous  thromboembolism prophylaxis with heparin subcutaneously.   CODE STATUS:  The patient is a full code.   TIME SPENT:  45 minutes.    ____________________________ Aaron Mose. Nylani Michetti, MD dkh:nb D: 01/07/2015 01:32:43 ET T: 01/07/2015 03:30:03 ET JOB#: [id number]  cc: Aaron Mose. Marcey Persad, MD, <Dictator> Skiler Tye Woodfin Ganja MD ELECTRONICALLY SIGNED 01/07/2015 20:32

## 2015-03-09 NOTE — Discharge Summary (Signed)
PATIENT NAME:  Elizabeth [name], Elizabeth [name] MR#:  [id number] DATE OF BIRTH:  1951-02-02  DATE OF ADMISSION:  01/06/2015 DATE OF DISCHARGE:  01/08/2015  ADMISSION COMPLAINT: Leg swelling.   DISCHARGE DIAGNOSES: 1. Bilateral lower extremity cellulitis.  2. Urinary tract infection.  3. Bilateral lower extremity edema.  4. Type 2 diabetes, insulin-requiring, with diabetic peripheral neuropathy.  5. Hypertension.   CONSULTATIONS: None.   PROCEDURES:  1. A 2-D echocardiogram, March 1, which showed a normal ejection fraction of 50% to 55%, normal left ventricular systolic function, mildly increased left ventricular posterior wall thickness, mild tricuspid regurgitation.  2. Ultrasound, bilateral lower extremities shows no significant occlusive femoral popliteal DVT in either extremity.   HISTORY OF PRESENT ILLNESS: This 64 year old Caucasian female with history of hyperlipidemia and hypertension, type 2 diabetes requiring insulin with peripheral neuropathy, presents with bilateral leg edema. She reports worsening edema for several weeks, coming and going. She has also had weeping edema for 3-4 days with blistering and now, erythema and pain.   HOSPITAL COURSE BY PROBLEM:  1. Bilateral lower extremity cellulitis: No blood cultures were drawn on admission. She was not febrile, but she did have significant erythema around circular healing wounds thought to be blisters. She was treated initially with vancomycin for cellulitis. Bilateral lower extremity Dopplers were negative for deep vein thrombosis. She has very good improvement in the erythema and pain in the legs during the hospitalization. She is being discharged on Bactrim for cellulitis.  2. Urinary tract infection: On presentation, her white blood cell count in the urine was 17. She had no dysuria. Again, no fevers or chills, no nausea or vomiting. She was treated with Rocephin while in hospital and transitioned to Bactrim on discharge. Cultures were not  performed on this urine on admission, unfortunately.  3. Bilateral lower extremity edema: A 2-D echocardiogram is negative, does not show signs of diastolic dysfunction. She is obese and fairly sedentary, and it is likely that this is chronic venous stasis. She was advised to maintain a low-sodium diet, elevate her feet when possible, and use compression stockings. She was placed in Unna boots during the hospitalization, which did help with edema, and she should follow with the wound care center for further management of lower extremity edema and wounds.  4. Diabetes mellitus type 2, insulin requiring. Her hemoglobin A1c was mildly elevated at 7.7 in the hospital. Blood sugars were well controlled during the hospitalization. She is discharged with no changes to her home insulin regimen and advised to maintain a low carbohydrate diet. Additional exercise is encouraged.  5. Hypertension: Blood pressure is controlled during hospitalization. No changes made to home regimen.   DISCHARGE PHYSICAL EXAMINATION: VITAL SIGNS: Temperature 98.1, pulse 56, respirations 19, blood pressure 143/81, oxygenation 96% on room air.  GENERAL: No acute distress.  RESPIRATORY: Lungs clear to auscultation bilaterally with good air movement.  CARDIOVASCULAR: Regular rate and rhythm. No murmurs, rubs, or gallops. There is 2+ peripheral edema, which is now in Unna boots and greatly reduced. Peripheral pulses are 2+.  ABDOMEN: Soft, nontender, nondistended. Bowel sounds are normal, obese.  PSYCHIATRIC: The patient did seem depressed through the hospitalization. This should be followed up in the outpatient setting. She is alert, oriented, and has good insight into her clinical conditions.   LABORATORY DATA: Sodium 140, potassium 3.6, chloride 99, bicarbonate 35, BUN 17, creatinine 0.98 glucose 127, A1c 7.7. LFTs normal. White blood cells 10.3, hemoglobin 11.8, platelets 235,000. MCV is 84.   DISCHARGE MEDICATIONS: 1. Pravastatin  40 mg 1 tablet daily.  2. Metformin 500 mg 2 tablets twice a day.  3. Atenolol 50 mg 1 tablet once a day.  4. Zantac 150 mg 1 tablet twice a day.  5. Gabapentin 300 mg 2 tablets once a day.  6. Hydrochlorothiazide/lisinopril 25/20 mg 1 tablet once a day.  7. Proventil CFC-free 90 mcg/inhalation 2 puffs inhaled 4 times a day as needed for wheezing.  8.  Caltrate 600 one tablet twice a day.  9. Letrozole  2.5 mg 1 tablet once a day.  10. Novolin 70/30, 55 units in the morning and 30 units at bedtime.  11. Tradjenta 5 mg 1 tablet once a day.  12. Aspirin 81 mg 1 tablet once a day.  13. Gabapentin 30 mg 1 tablet once a day.  14. Bactrim double strength 1 tablet every 12 hours for 5 days.  15. Calamine/zinc oxide topical dressing 2 applications daily.  16. Ondansetron 4 mg 1 tablet every 8 hours as needed for nausea or vomiting.   CONDITION ON DISCHARGE: Stable.   DISPOSITION: Discharged to home. She has declined home health.   DISCHARGE INSTRUCTIONS: 1. Diet: Low-sodium, low-fat, low-cholesterol ADA diet.  2. Activity limitations: None.  3. Time frame for follow-up: Please follow up within 1-2 weeks with the wound care clinic. Please follow up in 1-2 weeks with your primary care physician.   Marland KitchenTIME SPENT ON DISCHARGE: 40 minutes.     ____________________________ Earleen Newport. Volanda Napoleon, MD cpw:mw D: 01/13/2015 08:43:35 ET T: 01/13/2015 13:25:51 ET JOB#: [id number]  cc: Barnetta Chapel P. Volanda Napoleon, MD, <Dictator> Marguerita Merles, MD Aldean Jewett MD ELECTRONICALLY SIGNED 01/18/2015 10:51

## 2015-04-01 ENCOUNTER — Other Ambulatory Visit: Payer: PRIVATE HEALTH INSURANCE

## 2015-04-01 ENCOUNTER — Ambulatory Visit: Payer: PRIVATE HEALTH INSURANCE

## 2015-04-03 ENCOUNTER — Other Ambulatory Visit: Payer: Self-pay | Admitting: Specialist

## 2015-04-04 ENCOUNTER — Other Ambulatory Visit: Payer: Self-pay | Admitting: Specialist

## 2015-04-10 ENCOUNTER — Ambulatory Visit: Payer: PRIVATE HEALTH INSURANCE

## 2015-04-10 ENCOUNTER — Ambulatory Visit
Admission: RE | Admit: 2015-04-10 | Discharge: 2015-04-10 | Disposition: A | Discharge status: Home / Self Care | Payer: PRIVATE HEALTH INSURANCE | Source: Ambulatory Visit | Attending: Oncology | Admitting: Oncology

## 2015-04-10 DIAGNOSIS — M949 Disorder of cartilage, unspecified: Secondary | ICD-10-CM

## 2015-04-10 DIAGNOSIS — Z79891 Long term (current) use of opiate analgesic: Secondary | ICD-10-CM | POA: Diagnosis present

## 2015-04-10 DIAGNOSIS — Z853 Personal history of malignant neoplasm of breast: Secondary | ICD-10-CM | POA: Insufficient documentation

## 2015-04-10 DIAGNOSIS — Z780 Asymptomatic menopausal state: Secondary | ICD-10-CM | POA: Diagnosis not present

## 2015-04-10 DIAGNOSIS — Z79899 Other long term (current) drug therapy: Secondary | ICD-10-CM | POA: Insufficient documentation

## 2015-04-10 IMAGING — MG MM DIAG BREAST TOMO BILATERAL
8 of 15 series · 8 of 31 positions shown · non-contrast
Comparison: Priors

CLINICAL DATA: Patient status post right breast lumpectomy.

EXAM:
DIGITAL DIAGNOSTIC BILATERAL MAMMOGRAM WITH 3D TOMOSYNTHESIS AND CAD

[R MLO (1 of 3)]
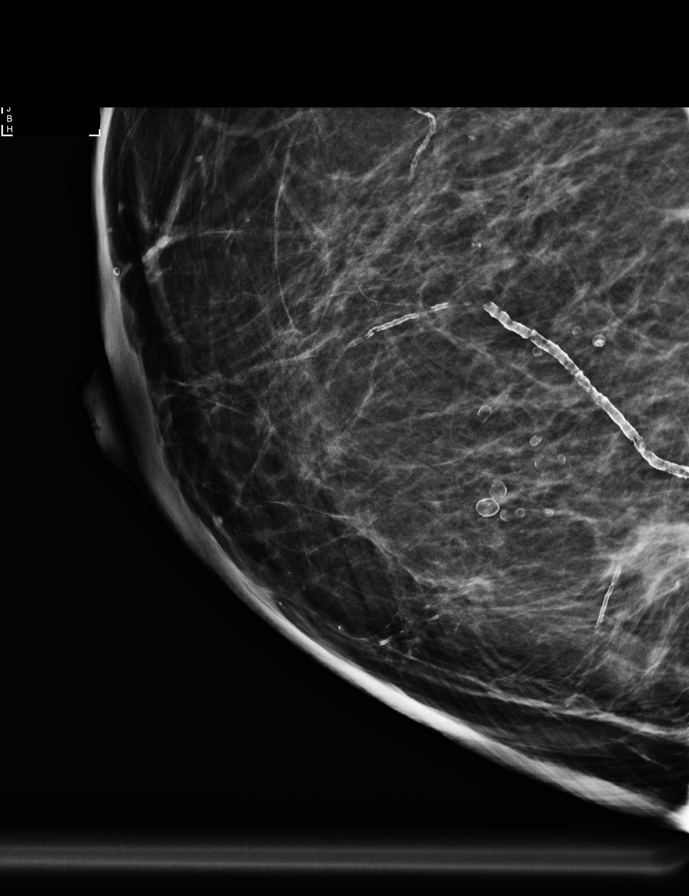

[R XCCL]
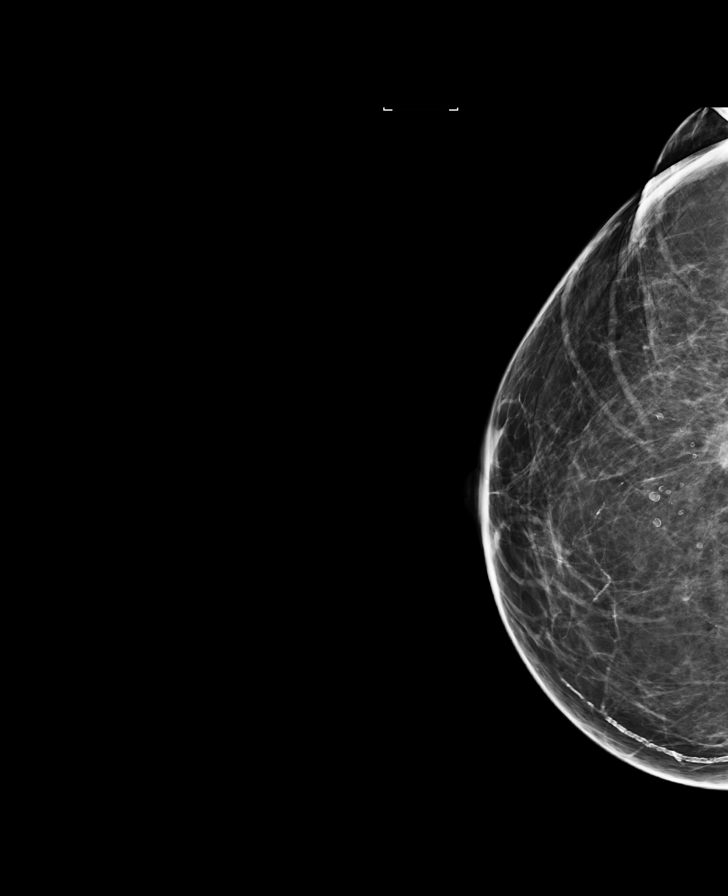

[R MLO (2 of 3)]
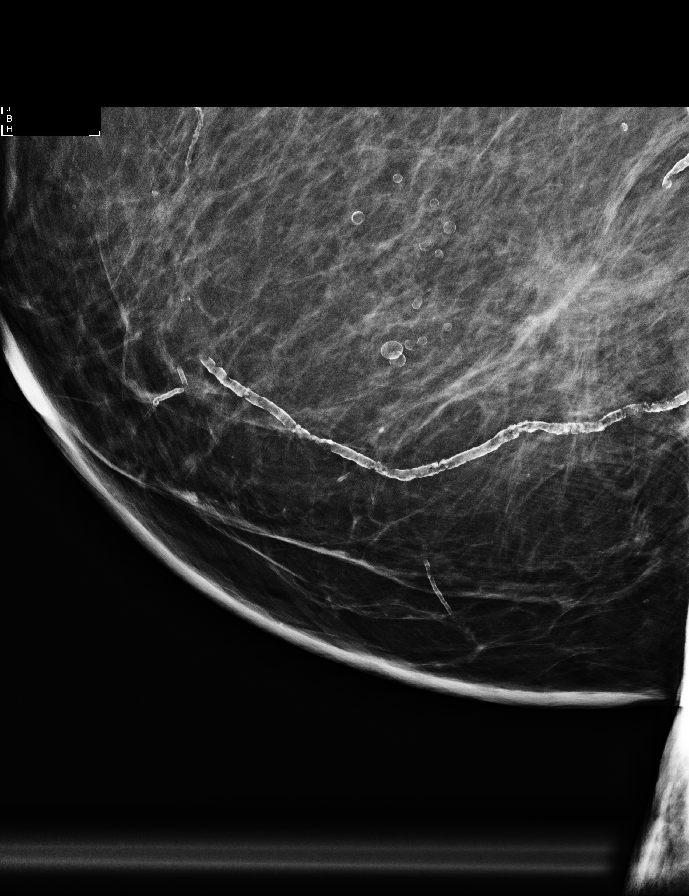

[L CC synth-2D]
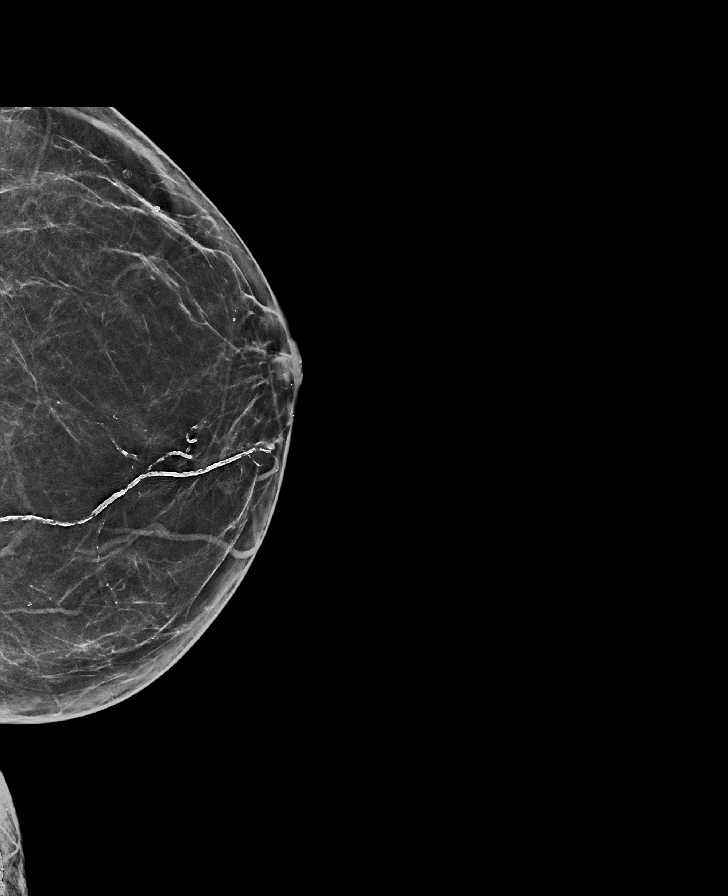

[L CC]
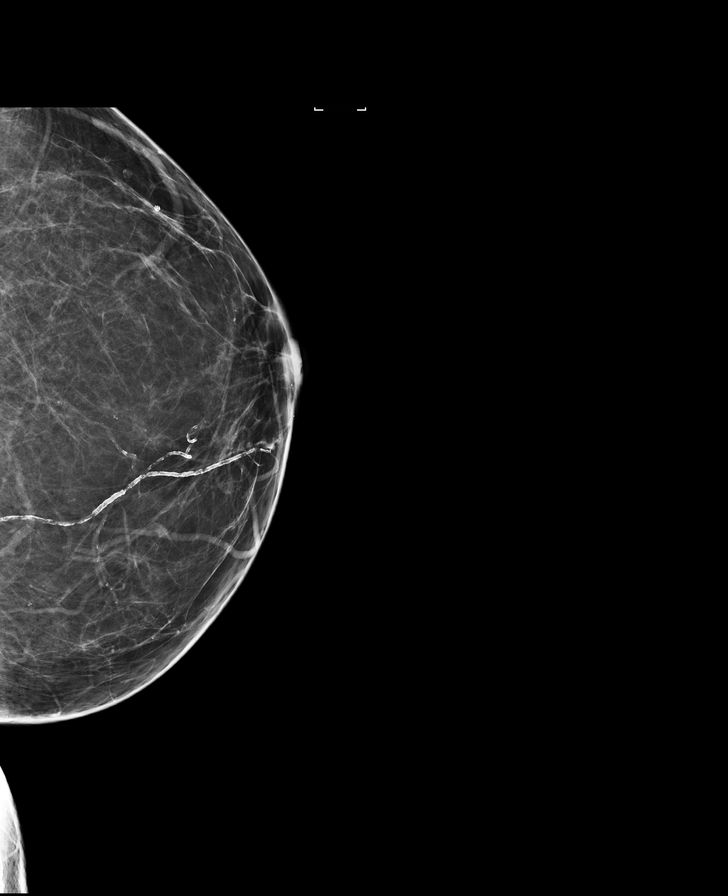

[R MLO (3 of 3)]
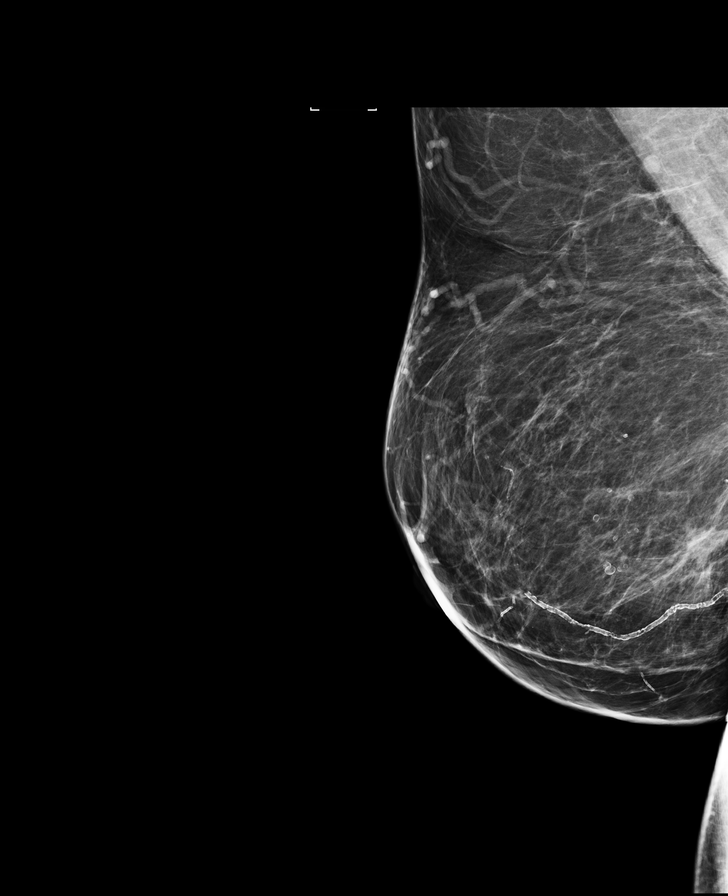

[L MLO synth-2D]
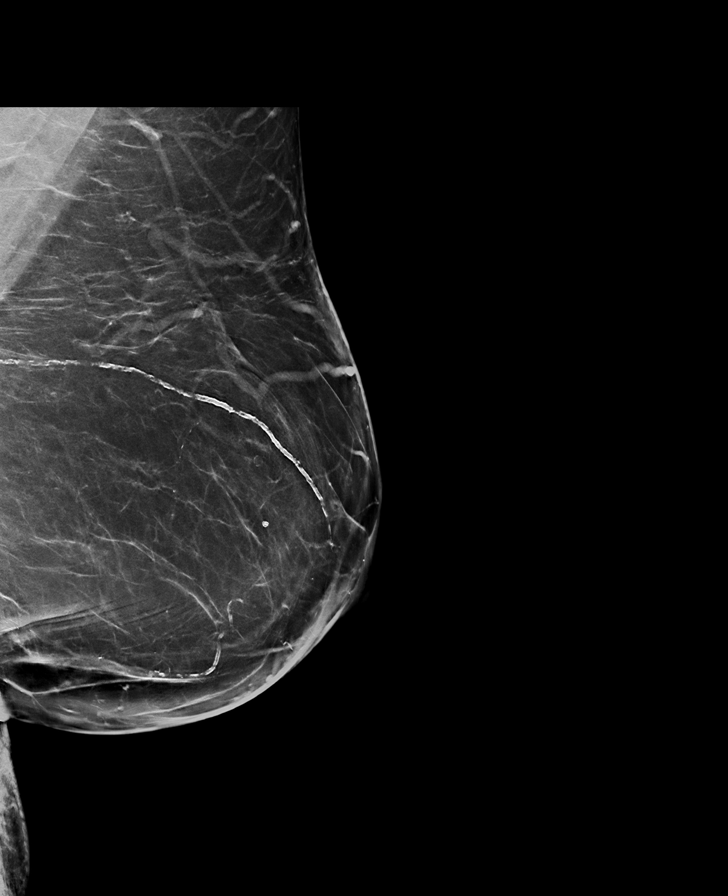

[L MLO]
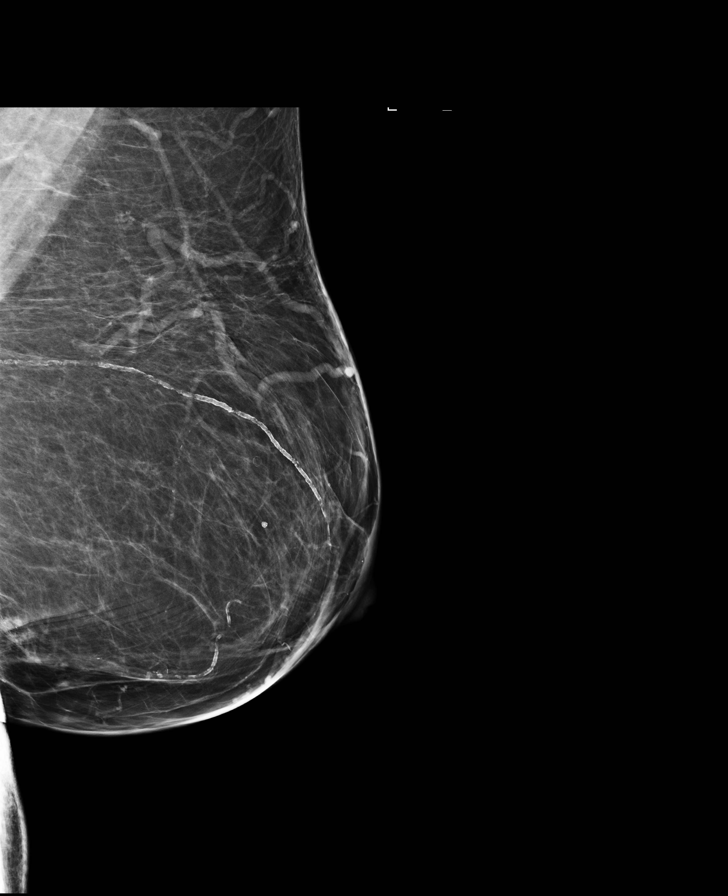

[8 of 31 positions shown; findings below may reference images not displayed]

ACR Breast Density Category b: There are scattered areas of
fibroglandular density.
FINDINGS: Interval postlumpectomy changes right breast. No concerning masses,
calcifications or nonsurgical architectural distortion identified
within either breast.

Mammographic images were processed with CAD.
IMPRESSION: Interval lumpectomy changes right breast.

No mammographic evidence for malignancy.

RECOMMENDATION:
Bilateral diagnostic mammography in 12 months.

I have discussed the findings and recommendations with the patient.
Results were also provided in writing at the conclusion of the
visit. If applicable, a reminder letter will be sent to the patient
regarding the next appointment.

BI-RADS CATEGORY  1: Negative.

## 2015-04-15 ENCOUNTER — Ambulatory Visit
Admission: RE | Admit: 2015-04-15 | Discharge: 2015-04-15 | Disposition: A | Discharge status: Home / Self Care | Payer: PRIVATE HEALTH INSURANCE | Source: Ambulatory Visit | Attending: Specialist | Admitting: Specialist

## 2015-04-15 DIAGNOSIS — K829 Disease of gallbladder, unspecified: Secondary | ICD-10-CM | POA: Diagnosis not present

## 2015-04-15 IMAGING — CR DG CHEST 2V
1 series · 2 of 2 positions shown · non-contrast
Comparison: PA and lateral chest x-ray [DATE]

CLINICAL DATA: Preoperative clearance for bariatric surgery,
nonsmoker, no known heart or lung problems.

EXAM:
CHEST  2 VIEW

[Series 1: w chest pa · 0.14mm/px · 2 of 2 slices shown]
[im 1/2]
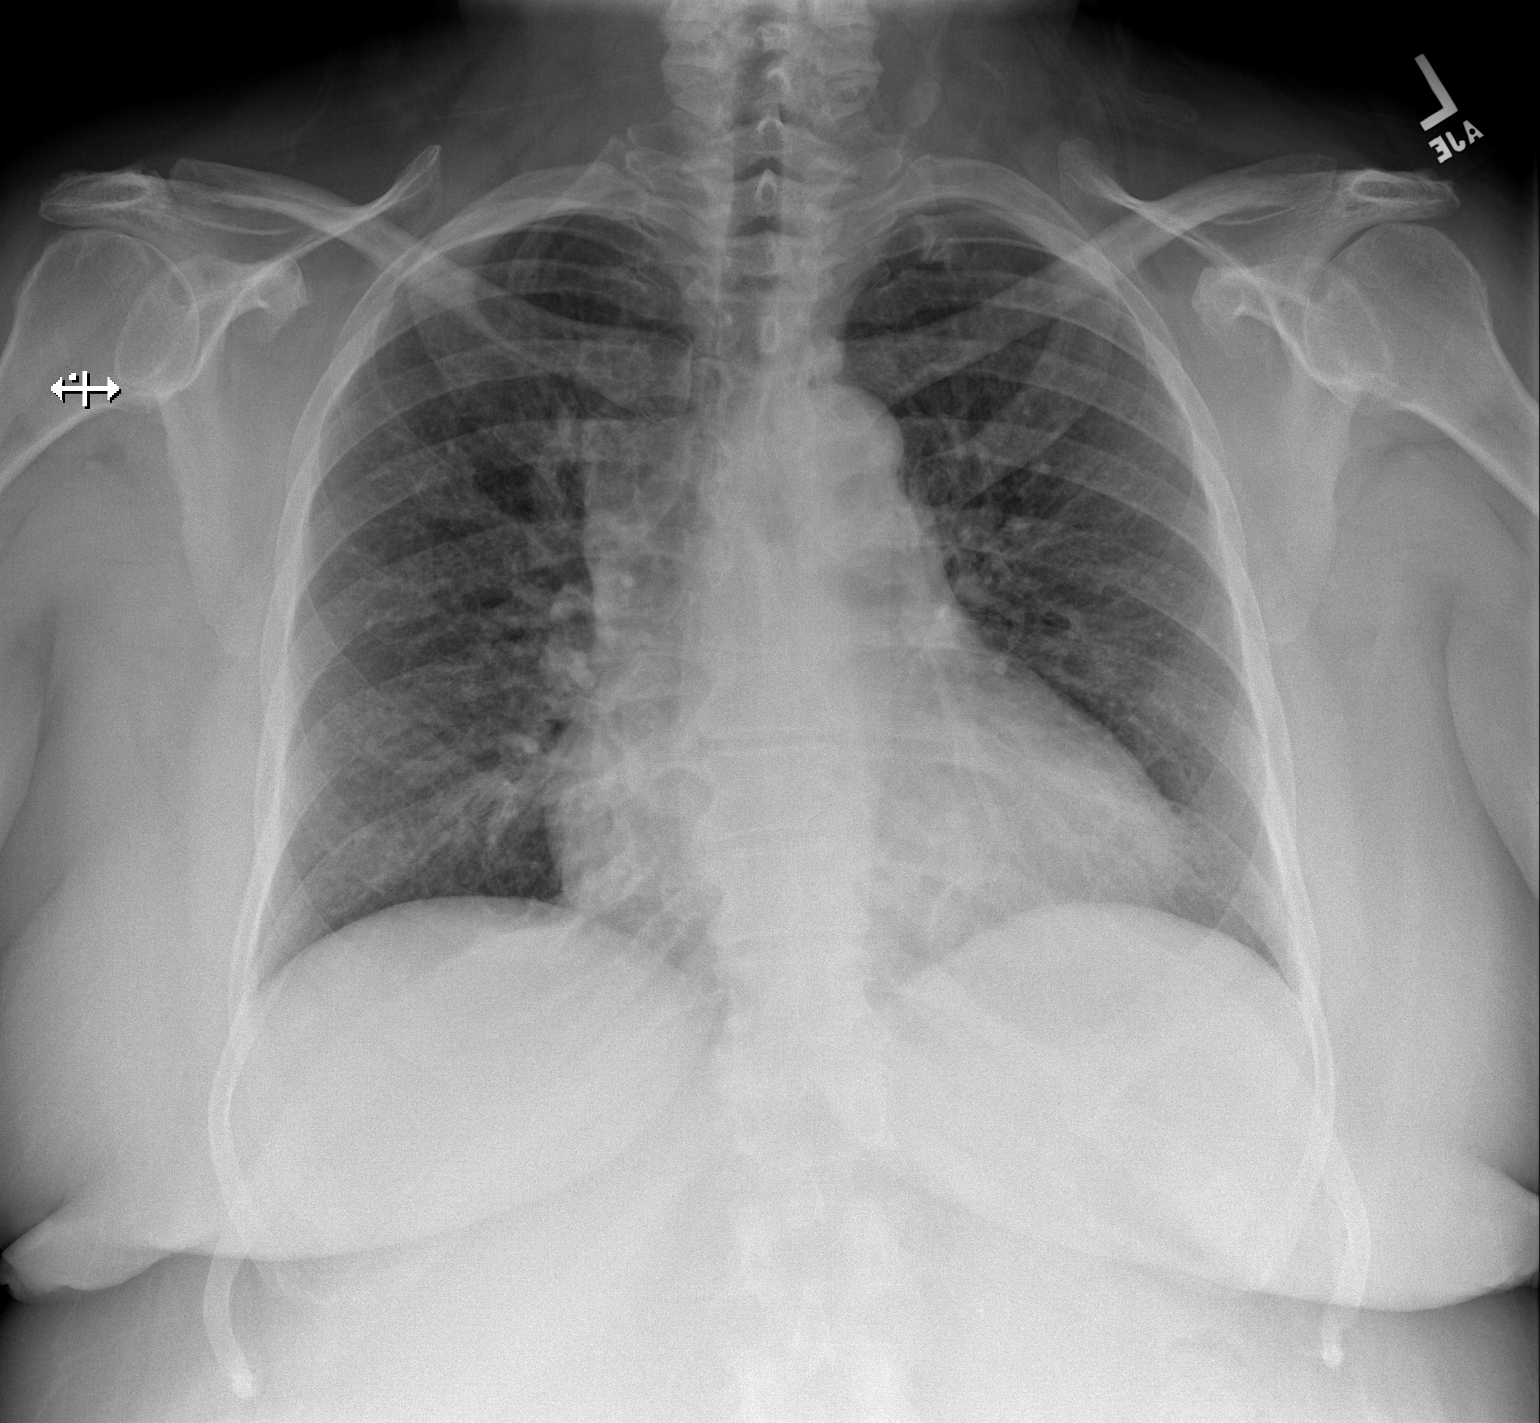
[im 2/2]
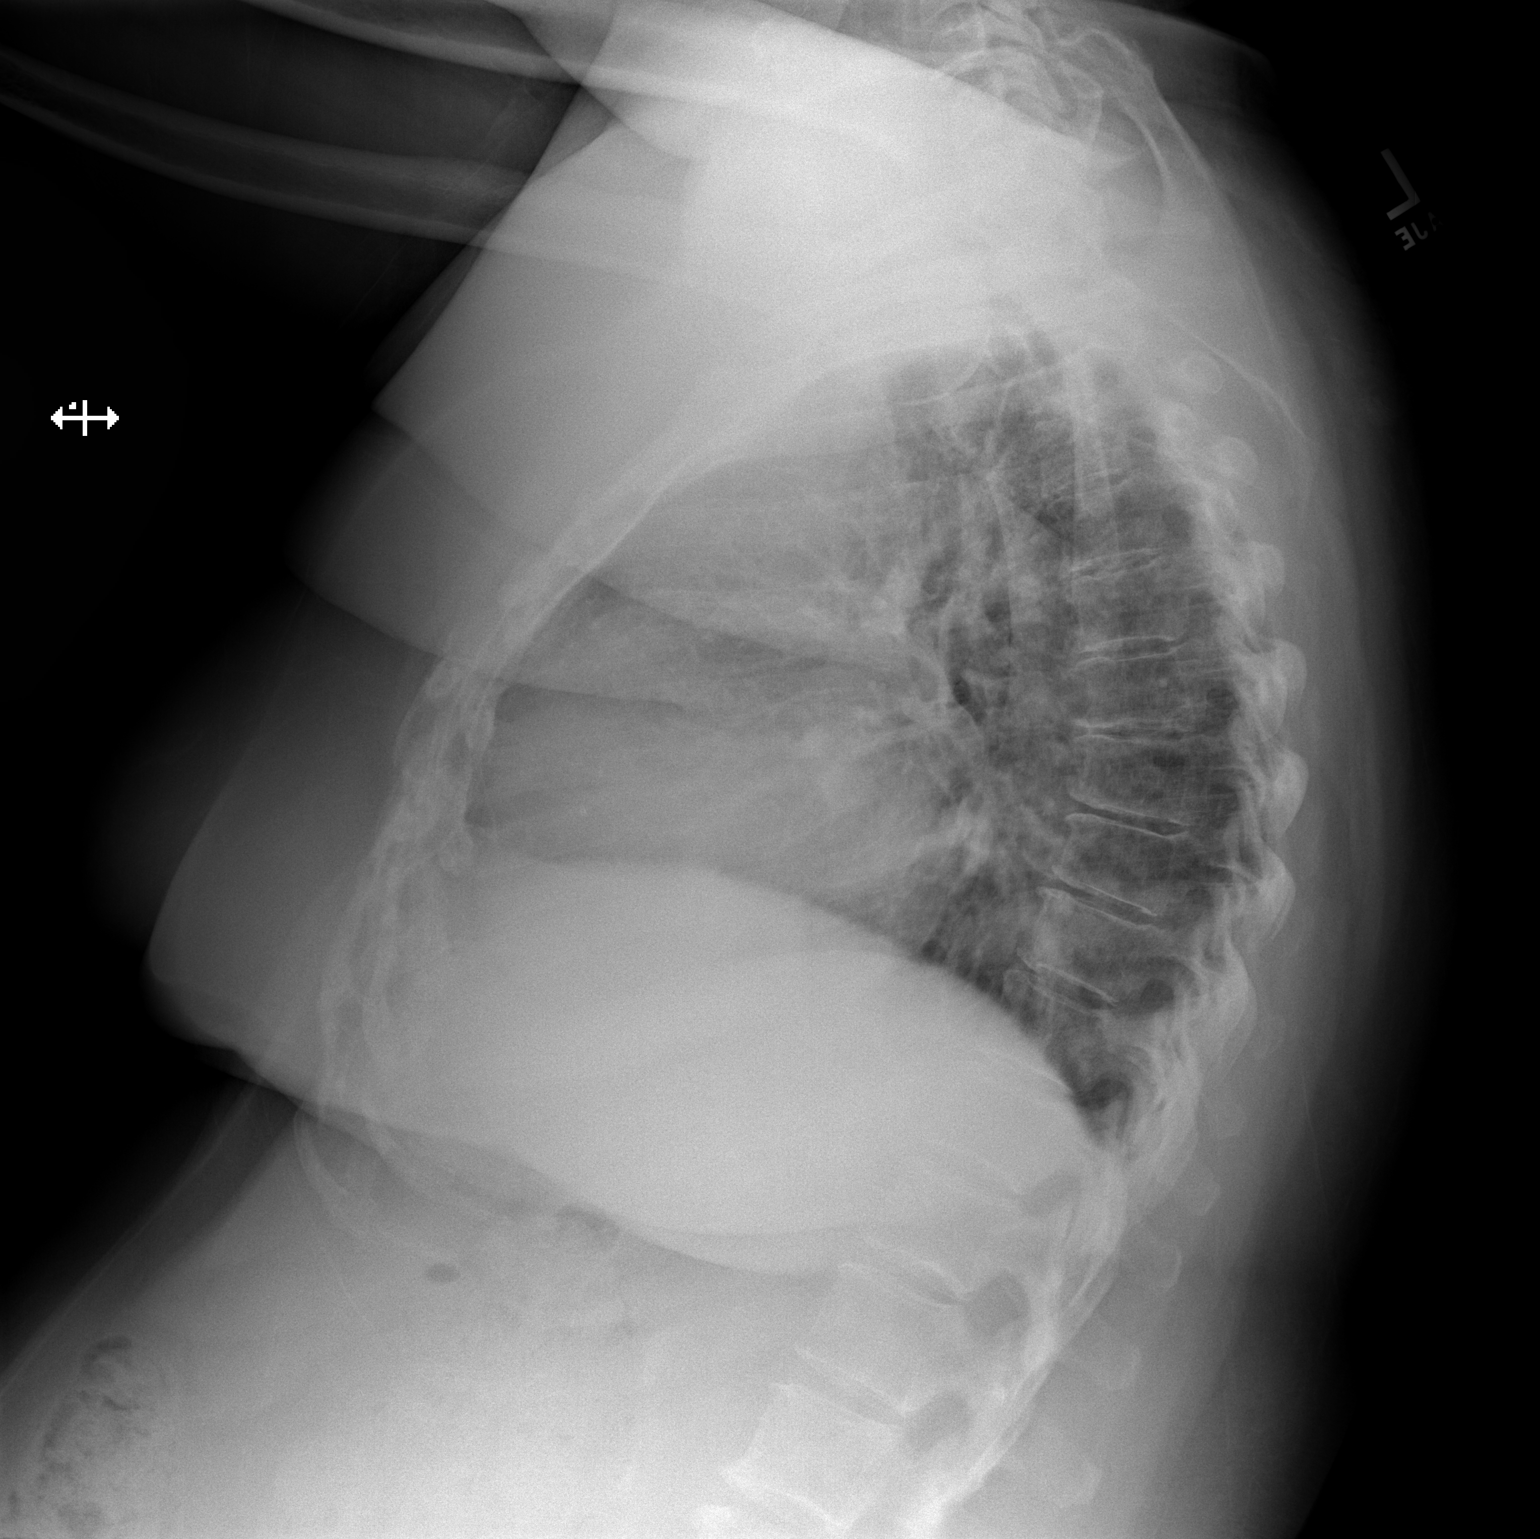

[2 of 2 positions shown; findings below may reference images not displayed]

FINDINGS: The lungs are adequately inflated. The interstitial markings are
chronically increased bilaterally. There is no alveolar infiltrate.
There are no pulmonary parenchymal nodules or masses. There is no
pleural effusion or pneumothorax. The cardiac silhouette top-normal
in size. The pulmonary vascularity is normal. The bony thorax
exhibits no acute abnormality.
IMPRESSION: Chronically increased pulmonary interstitial markings. There is no
acute cardiopulmonary abnormality.

## 2015-05-02 ENCOUNTER — Other Ambulatory Visit: Payer: Self-pay | Admitting: Oncology

## 2015-05-02 DIAGNOSIS — C50919 Malignant neoplasm of unspecified site of unspecified female breast: Secondary | ICD-10-CM

## 2015-05-16 ENCOUNTER — Other Ambulatory Visit: Payer: Self-pay | Admitting: *Deleted

## 2015-05-16 DIAGNOSIS — C50919 Malignant neoplasm of unspecified site of unspecified female breast: Secondary | ICD-10-CM

## 2015-05-21 ENCOUNTER — Encounter: Payer: Self-pay | Admitting: Oncology

## 2015-05-21 ENCOUNTER — Inpatient Hospital Stay: Payer: PRIVATE HEALTH INSURANCE | Attending: Oncology | Admitting: Oncology

## 2015-05-21 ENCOUNTER — Inpatient Hospital Stay: Payer: PRIVATE HEALTH INSURANCE

## 2015-05-21 VITALS — BP 134/75 | HR 70 | Temp 96.8°F | Wt 271.4 lb

## 2015-05-21 DIAGNOSIS — Z79811 Long term (current) use of aromatase inhibitors: Secondary | ICD-10-CM | POA: Diagnosis not present

## 2015-05-21 DIAGNOSIS — I890 Lymphedema, not elsewhere classified: Secondary | ICD-10-CM | POA: Diagnosis not present

## 2015-05-21 DIAGNOSIS — Z803 Family history of malignant neoplasm of breast: Secondary | ICD-10-CM

## 2015-05-21 DIAGNOSIS — Z794 Long term (current) use of insulin: Secondary | ICD-10-CM | POA: Insufficient documentation

## 2015-05-21 DIAGNOSIS — C50511 Malignant neoplasm of lower-outer quadrant of right female breast: Secondary | ICD-10-CM | POA: Insufficient documentation

## 2015-05-21 DIAGNOSIS — Z170 Estrogen receptor positive status [ER+]: Secondary | ICD-10-CM | POA: Diagnosis not present

## 2015-05-21 DIAGNOSIS — C50911 Malignant neoplasm of unspecified site of right female breast: Secondary | ICD-10-CM | POA: Diagnosis not present

## 2015-05-21 DIAGNOSIS — I739 Peripheral vascular disease, unspecified: Secondary | ICD-10-CM | POA: Insufficient documentation

## 2015-05-21 DIAGNOSIS — E119 Type 2 diabetes mellitus without complications: Secondary | ICD-10-CM | POA: Insufficient documentation

## 2015-05-21 DIAGNOSIS — Z8673 Personal history of transient ischemic attack (TIA), and cerebral infarction without residual deficits: Secondary | ICD-10-CM | POA: Diagnosis not present

## 2015-05-21 DIAGNOSIS — Z7982 Long term (current) use of aspirin: Secondary | ICD-10-CM | POA: Insufficient documentation

## 2015-05-21 DIAGNOSIS — C50919 Malignant neoplasm of unspecified site of unspecified female breast: Secondary | ICD-10-CM

## 2015-05-21 DIAGNOSIS — Z79899 Other long term (current) drug therapy: Secondary | ICD-10-CM | POA: Insufficient documentation

## 2015-05-21 LAB — CBC WITH DIFFERENTIAL/PLATELET
Basophils Absolute: 0.1 10*3/uL (ref 0–0.1)
Basophils Relative: 1 %
EOS ABS: 0.4 10*3/uL (ref 0–0.7)
EOS PCT: 5 %
HEMATOCRIT: 37.7 % (ref 35.0–47.0)
HEMOGLOBIN: 12.2 g/dL (ref 12.0–16.0)
LYMPHS PCT: 20 %
Lymphs Abs: 1.7 10*3/uL (ref 1.0–3.6)
MCH: 26.5 pg (ref 26.0–34.0)
MCHC: 32.4 g/dL (ref 32.0–36.0)
MCV: 81.7 fL (ref 80.0–100.0)
MONO ABS: 0.6 10*3/uL (ref 0.2–0.9)
Monocytes Relative: 7 %
NEUTROS PCT: 67 %
Neutro Abs: 5.8 10*3/uL (ref 1.4–6.5)
PLATELETS: 247 10*3/uL (ref 150–440)
RBC: 4.62 MIL/uL (ref 3.80–5.20)
RDW: 15.2 % — ABNORMAL HIGH (ref 11.5–14.5)
WBC: 8.6 10*3/uL (ref 3.6–11.0)

## 2015-05-21 LAB — COMPREHENSIVE METABOLIC PANEL
ALK PHOS: 68 U/L (ref 38–126)
ALT: 12 U/L — AB (ref 14–54)
ANION GAP: 8 (ref 5–15)
AST: 13 U/L — AB (ref 15–41)
Albumin: 3.9 g/dL (ref 3.5–5.0)
BUN: 29 mg/dL — ABNORMAL HIGH (ref 6–20)
CO2: 32 mmol/L (ref 22–32)
Calcium: 8.8 mg/dL — ABNORMAL LOW (ref 8.9–10.3)
Chloride: 99 mmol/L — ABNORMAL LOW (ref 101–111)
Creatinine, Ser: 0.93 mg/dL (ref 0.44–1.00)
GFR calc Af Amer: >60 mL/min (ref >60)
GLUCOSE: 134 mg/dL — AB (ref 65–99)
POTASSIUM: 3.6 mmol/L (ref 3.5–5.1)
Sodium: 139 mmol/L (ref 135–145)
Total Bilirubin: 0.6 mg/dL (ref 0.3–1.2)
Total Protein: 7.2 g/dL (ref 6.5–8.1)

## 2015-05-21 NOTE — Progress Notes (Signed)
[facility] @ [facility] [phone]) [phone]  Fax:([phone]) [place] OB: 1951-08-09  MR#: [id number]  RZN#:[id number]  Patient Care Team: Marguerita Merles, MD as PCP - General (Family Medicine) Christin Fudge, MD as Consulting Physician (Surgery)  CHIEF COMPLAINT:  Chief Complaint  Patient presents with  . 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 Estrogen receptor positive.  Progesterone receptor positive.  HER-2 receptor negative.  Diagnosis in June of 2015  2.status post partial lumpectomy on May 02, 2014 pT1b  pN0 M0  stage 1B 3.starting radiation to the breast followed by letrozole vitamin D and calciumfor 5 years     Carcinoma of right breast     INTERVAL HISTORY:  64 year old lady with history of carcinoma of breast.  Had a recent mammogram as well as bone density study done.  Patient has noticed some swelling of lower extremity.  Taking for  etrozole calcium and vitamin D.  No bony pains.  No bony fractures. REVIEW OF SYSTEMS:    GENERAL:  Feels good.  Active.  No fevers, sweats or weight loss. PERFORMANCE STATUS (ECOG)01 HEENT:  No visual changes, runny nose, sore throat, mouth sores or tenderness. Lungs: No shortness of breath or cough.  No hemoptysis. Cardiac:  No chest pain, palpitations, orthopnea, or PND. GI:  No nausea, vomiting, diarrhea, constipation, melena or hematochezia. GU:  No urgency, frequency, dysuria, or hematuria. Musculoskeletal:  No back pain.  No joint pain.  No muscle tenderness. Extremities:  Swelling of both lower extremity Skin:  No rashes or skin changes. Neuro:  No headache, numbness or weakness, balance or coordination issues. Endocrine:  No diabetes, thyroid issues, hot flashes or night sweats. Psych:  No mood changes, depression or anxiety. Pain:  No focal pain. Review of systems:  All other systems reviewed and found to be negative.  As per HPI. Otherwise, a  complete review of systems is negatve.  PAST MEDICAL HISTORY: Past Medical History  Diagnosis Date  . Varicose veins   . Diabetes mellitus without complication   . Lymphedema   . Cancer     breast  . Stroke   . Carcinoma of right breast 05/21/2015    PAST SURGICAL HISTORY: Past Surgical History  Procedure Laterality Date  . Breast lumpectomy Right 04-2014    followed by radiation,  no chemo  . Eye surgery      cataracts and left eye detached retina repair  . Breast reduction surgery  1977  . Breast excisional biopsy Right 2015    positive, with radiation    FAMILY HISTORY Family History  Problem Relation Age of Onset  . Diabetes Mother   . Cancer Mother   . Breast cancer Mother 78  . Diabetes Father   . Diabetes Sister   . Diabetes Brother     ADVANCED DIRECTIVES:  Patient does not have any living will or healthcare power of attorney.  Information was given .  Available resources had been discussed.  We will follow-up on subsequent appointments regarding this issue HEALTH MAINTENANCE: History  Substance Use Topics  . Smoking status: Never Smoker   . Smokeless tobacco: Not on file  . Alcohol Use: No      No Known Allergies  Current Outpatient Prescriptions  Medication Sig Dispense Refill  . albuterol (PROVENTIL HFA;VENTOLIN HFA) 108 (90 BASE) MCG/ACT inhaler Inhale 2 puffs into the lungs every 6 (six) hours  as needed for wheezing or shortness of breath.    [name] aspirin 81 MG tablet Take 81 mg by mouth daily.    [name] atenolol (TENORMIN) 50 MG tablet Take 50 mg by mouth daily.    . brimonidine (ALPHAGAN) 0.2 % ophthalmic solution Place 1 drop into the left eye 2 (two) times daily.    [name] CALAMINE-ZINC OXIDE EX Apply topically.    . Calcium-Vitamin D (CALTRATE 600 PLUS-VIT D PO) Take by mouth.    . gabapentin (NEURONTIN) 300 MG capsule Take 300 mg by mouth 3 (three) times daily.    . insulin NPH-regular Human (NOVOLIN 70/30) (70-30) 100 UNIT/ML injection Inject into the  skin 2 (two) times daily with a meal.    . letrozole (FEMARA) 2.5 MG tablet TAKE ONE TABLET BY MOUTH ONCE DAILY 30 tablet 0  . linagliptin (TRADJENTA) 5 MG TABS tablet Take 5 mg by mouth daily.    [name] lisinopril-hydrochlorothiazide (PRINZIDE,ZESTORETIC) 20-25 MG per tablet Take 1 tablet by mouth daily.    . metFORMIN (GLUCOPHAGE) 500 MG tablet Take 500 mg by mouth 2 (two) times daily with a meal.    . ondansetron (ZOFRAN) 4 MG tablet Take 4 mg by mouth every 8 (eight) hours as needed for nausea or vomiting.    . pravastatin (PRAVACHOL) 40 MG tablet Take 40 mg by mouth daily.    . ranitidine (ZANTAC) 150 MG tablet Take 150 mg by mouth 2 (two) times daily.    . timolol (BETIMOL) 0.5 % ophthalmic solution Place 1 drop into the left eye 2 (two) times daily.     No current facility-administered medications for this visit.    OBJECTIVE:  Filed Vitals:   05/21/15 1057  BP: 134/75  Pulse: 70  Temp: 96.8 F (36 C)     Body mass index is 49.62 kg/(m^2).    ECOG FS:1 - Symptomatic but completely ambulatory  PHYSICAL EXAM: GENERAL:  Well developed, well nourished, sitting comfortably in the exam room in no acute distress. MODERATELY OBESE  MENTAL STATUS:  Alert and oriented to person, place and time. HEADNormocephalic, atraumatic, face symmetric, no Cushingoid features. EYES:.  Pupils equal round and reactive to light and accomodation.  No conjunctivitis or scleral icterus.   RESPIRATORY:  Clear to auscultation without rales, wheezes or rhonchi. CARDIOVASCULAR:  Regular rate and rhythm without murmur, rub or gallop. BREAST:  Right breast without masses, status post lumpectomy and axillary lymph node evaluation on the right side radiation changes skin changes or nipple discharge.  Left breast without masses, skin changes or nipple discharge. ABDOMEN:  Soft, non-tender, with active bowel sounds, and no hepatosplenomegaly.  No masses. BACK:  No CVA tenderness.  No tenderness on percussion of the back  or rib cage. SKIN:  No rashes, ulcers or lesions. EXTREMITIES: Swelling 2+ with mild cellulitis LYMPH NODES: No palpable cervical, supraclavicular, axillary or inguinal adenopathy  NEUROLOGICAL: Unremarkable. PSYCH:  Appropriate.  LAB RESULTS:  Appointment on 05/21/2015  Component Date Value Ref Range Status  . WBC 05/21/2015 8.6  3.6 - 11.0 K/uL Final  . RBC 05/21/2015 4.62  3.80 - 5.20 MIL/uL Final  . Hemoglobin 05/21/2015 12.2  12.0 - 16.0 g/dL Final  . HCT 05/21/2015 37.7  35.0 - 47.0 % Final  . MCV 05/21/2015 81.7  80.0 - 100.0 fL Final  . MCH 05/21/2015 26.5  26.0 - 34.0 pg Final  . MCHC 05/21/2015 32.4  32.0 - 36.0 g/dL Final  . RDW 05/21/2015 15.2* 11.5 - 14.5 %  Final  . Platelets 05/21/2015 247  150 - 440 K/uL Final  . Neutrophils Relative % 05/21/2015 67   Final  . Neutro Abs 05/21/2015 5.8  1.4 - 6.5 K/uL Final  . Lymphocytes Relative 05/21/2015 20   Final  . Lymphs Abs 05/21/2015 1.7  1.0 - 3.6 K/uL Final  . Monocytes Relative 05/21/2015 7   Final  . Monocytes Absolute 05/21/2015 0.6  0.2 - 0.9 K/uL Final  . Eosinophils Relative 05/21/2015 5   Final  . Eosinophils Absolute 05/21/2015 0.4  0 - 0.7 K/uL Final  . Basophils Relative 05/21/2015 1   Final  . Basophils Absolute 05/21/2015 0.1  0 - 0.1 K/uL Final  . Sodium 05/21/2015 139  135 - 145 mmol/L Final  . Potassium 05/21/2015 3.6  3.5 - 5.1 mmol/L Final  . Chloride 05/21/2015 99* 101 - 111 mmol/L Final  . CO2 05/21/2015 32  22 - 32 mmol/L Final  . Glucose, Bld 05/21/2015 134* 65 - 99 mg/dL Final  . BUN 05/21/2015 29* 6 - 20 mg/dL Final  . Creatinine, Ser 05/21/2015 0.93  0.44 - 1.00 mg/dL Final  . Calcium 05/21/2015 8.8* 8.9 - 10.3 mg/dL Final  . Total Protein 05/21/2015 7.2  6.5 - 8.1 g/dL Final  . Albumin 05/21/2015 3.9  3.5 - 5.0 g/dL Final  . AST 05/21/2015 13* 15 - 41 U/L Final  . ALT 05/21/2015 12* 14 - 54 U/L Final  . Alkaline Phosphatase 05/21/2015 68  38 - 126 U/L Final  . Total Bilirubin 05/21/2015  0.6  0.3 - 1.2 mg/dL Final  . GFR calc non Af Amer 05/21/2015 >60  >60 mL/min Final  . GFR calc Af Amer 05/21/2015 >60  >60 mL/min Final   Comment: (NOTE) The eGFR has been calculated using the CKD EPI equation. This calculation has not been validated in all clinical situations. eGFR's persistently <60 mL/min signify possible Chronic Kidney Disease.   . Anion gap 05/21/2015 8  5 - 15 Final    STUDIES:  mammogram was in June of 2016  Birad 1 Bone density did not reveal any evidence of osteopenia or osteoporosis  Carcinoma of right breast stage I disease on clinical ground there is no evidence of recurrent disease  MEDICAL DECISION MAKINCONTINUE LETROZOLE CALCIUM AND VITAMIN D THE EVALUATION IN 6 MONTH ALL LAB DATA HAS BEEN REVIEWED cellulitis and swelling of lower extremity being addressed by primary care physician Patient expressed understanding and was in agreement with this plan. She also understands that She can call clinic at any time with any questions, concerns, or complaints.    No matching staging information was found for the patient.  Forest Gleason, MD   05/21/2015 11:20 AM

## 2015-05-21 NOTE — Progress Notes (Signed)
Patient does not have living will  Information given today.  Patient states she started on an OTC iron today.  Does not know name or dosage.  Will provide information at next appt.

## 2015-06-10 ENCOUNTER — Other Ambulatory Visit: Payer: Self-pay | Admitting: Oncology

## 2015-07-08 ENCOUNTER — Ambulatory Visit: Payer: PRIVATE HEALTH INSURANCE | Attending: Otolaryngology

## 2015-07-08 DIAGNOSIS — E780 Pure hypercholesterolemia: Secondary | ICD-10-CM | POA: Diagnosis not present

## 2015-07-08 DIAGNOSIS — E119 Type 2 diabetes mellitus without complications: Secondary | ICD-10-CM | POA: Diagnosis present

## 2015-07-08 DIAGNOSIS — F5101 Primary insomnia: Secondary | ICD-10-CM | POA: Insufficient documentation

## 2015-07-08 DIAGNOSIS — I10 Essential (primary) hypertension: Secondary | ICD-10-CM | POA: Diagnosis present

## 2015-07-08 DIAGNOSIS — Z853 Personal history of malignant neoplasm of breast: Secondary | ICD-10-CM | POA: Insufficient documentation

## 2015-07-08 DIAGNOSIS — F329 Major depressive disorder, single episode, unspecified: Secondary | ICD-10-CM | POA: Diagnosis not present

## 2015-07-23 DIAGNOSIS — K838 Other specified diseases of biliary tract: Secondary | ICD-10-CM | POA: Insufficient documentation

## 2015-07-23 DIAGNOSIS — K760 Fatty (change of) liver, not elsewhere classified: Secondary | ICD-10-CM | POA: Insufficient documentation

## 2015-08-01 ENCOUNTER — Other Ambulatory Visit: Payer: Self-pay | Admitting: Oncology

## 2015-09-09 ENCOUNTER — Encounter
Admission: RE | Admit: 2015-09-09 | Discharge: 2015-09-09 | Disposition: A | Discharge status: Home / Self Care | Payer: PRIVATE HEALTH INSURANCE | Source: Ambulatory Visit | Attending: Specialist | Admitting: Specialist

## 2015-09-09 DIAGNOSIS — Z01818 Encounter for other preprocedural examination: Secondary | ICD-10-CM | POA: Diagnosis not present

## 2015-09-09 DIAGNOSIS — K449 Diaphragmatic hernia without obstruction or gangrene: Secondary | ICD-10-CM | POA: Diagnosis not present

## 2015-09-09 DIAGNOSIS — I10 Essential (primary) hypertension: Secondary | ICD-10-CM | POA: Diagnosis not present

## 2015-09-09 HISTORY — DX: Psoriasis, unspecified: L40.9

## 2015-09-09 HISTORY — DX: Nausea with vomiting, unspecified: R11.2

## 2015-09-09 HISTORY — DX: Other specified postprocedural states: Z98.890

## 2015-09-09 LAB — BASIC METABOLIC PANEL
Anion gap: 9 (ref 5–15)
BUN: 16 mg/dL (ref 6–20)
CO2: 31 mmol/L (ref 22–32)
Calcium: 9.4 mg/dL (ref 8.9–10.3)
Chloride: 98 mmol/L — ABNORMAL LOW (ref 101–111)
Creatinine, Ser: 0.74 mg/dL (ref 0.44–1.00)
GFR calc Af Amer: >60 mL/min (ref >60)
GFR calc non Af Amer: >60 mL/min (ref >60)
GLUCOSE: 217 mg/dL — AB (ref 65–99)
POTASSIUM: 3.8 mmol/L (ref 3.5–5.1)
Sodium: 138 mmol/L (ref 135–145)

## 2015-09-09 LAB — CBC
HCT: 39.2 % (ref 35.0–47.0)
Hemoglobin: 13 g/dL (ref 12.0–16.0)
MCH: 26.6 pg (ref 26.0–34.0)
MCHC: 33.2 g/dL (ref 32.0–36.0)
MCV: 80.2 fL (ref 80.0–100.0)
PLATELETS: 252 10*3/uL (ref 150–440)
RBC: 4.89 MIL/uL (ref 3.80–5.20)
RDW: 14.6 % — ABNORMAL HIGH (ref 11.5–14.5)
WBC: 8.1 10*3/uL (ref 3.6–11.0)

## 2015-09-09 LAB — DIFFERENTIAL
Basophils Absolute: 0.1 10*3/uL (ref 0–0.1)
Basophils Relative: 1 %
Eosinophils Absolute: 0.3 10*3/uL (ref 0–0.7)
Eosinophils Relative: 4 %
LYMPHS PCT: 18 %
Lymphs Abs: 1.4 10*3/uL (ref 1.0–3.6)
MONO ABS: 0.5 10*3/uL (ref 0.2–0.9)
Monocytes Relative: 6 %
Neutro Abs: 5.8 10*3/uL (ref 1.4–6.5)
Neutrophils Relative %: 71 %

## 2015-09-09 LAB — TYPE AND SCREEN
ABO/RH(D): A POS
ANTIBODY SCREEN: NEGATIVE

## 2015-09-09 LAB — SURGICAL PCR SCREEN
MRSA, PCR: POSITIVE — AB
Staphylococcus aureus: POSITIVE — AB

## 2015-09-09 LAB — ABO/RH: ABO/RH(D): A POS

## 2015-09-09 NOTE — Patient Instructions (Signed)
  Your procedure is scheduled on: Tuesday September 23, 2015. Report to Same Day Surgery. To find out your arrival time please call [phone] between 1PM - 3PM on Monday September 22, 2015.  Remember: Instructions that are not followed completely may result in serious medical risk, up to and including death, or upon the discretion of your surgeon and anesthesiologist your surgery may need to be rescheduled.    _x__ 1. Do not eat food or drink liquids after midnight. No gum chewing or hard candies.     ____ 2. No Alcohol for 24 hours before or after surgery.   ____ 3. Bring all medications with you on the day of surgery if instructed.    _x_ 4. Notify your doctor if there is any change in your medical condition     (cold, fever, infections).     Do not wear jewelry, make-up, hairpins, clips or nail polish.  Do not wear lotions, powders, or perfumes. You may wear deodorant.  Do not shave 48 hours prior to surgery. Men may shave face and neck.  Do not bring valuables to the hospital.    [facility] is not responsible for any belongings or valuables.               Contacts, dentures or bridgework may not be worn into surgery.  Leave your suitcase in the car. After surgery it may be brought to your room.  For patients admitted to the hospital, discharge time is determined by your treatment team.   Patients discharged the day of surgery will not be allowed to drive home.    Please read over the following fact sheets that you were given:   [facility] Preparing for Surgery  __x__ Take these medicines the morning of surgery with A SIP OF WATER:    1. atenolol (TENORMIN)  2. gabapentin (NEURONTIN)   3. lisinopril (PRINIVIL,ZESTRIL  4.ranitidine (ZANTAC)  ____ Fleet Enema (as directed)   _x___ Use CHG Soap as directed  _x___ Use inhalers on the day of surgery and bring to hospital.  _x___ Stop metformin 2 days prior to surgery on 09/21/15.    _x___ Take 1/2 of usual insulin dose  the night before surgery and none on the morning of surgery.   _x___ Stop aspirin 1 week prior to surgery per Dr. Darnell Level instructions.  ____ Stop Anti-inflammatories on does not apply.  It is OK to take Tylenol for pain.   ____ Stop supplements until after surgery.    ____ Bring C-Pap to the hospital.

## 2015-09-09 NOTE — Pre-Procedure Instructions (Signed)
MRSA and Staph aureus nasal swabs came back positive.  Called Dr. Synthia Innocent office, spoke with Abigail Butts, she will forward this information on to Dr. Darnell Level for evaluation and treatment.

## 2015-09-09 NOTE — Pre-Procedure Instructions (Signed)
Crystal from Dr. Mina Marble office called, pt will be seen in office by Dr. Darnell Level on 09/15/15.  The H+P and pre-op orders will be faxed by 09/18/15.

## 2015-09-09 NOTE — Pre-Procedure Instructions (Addendum)
Reather Laurence PA from Dr. Synthia Innocent office called regarding pt's positive MRSA nasal swab result from today.  Clarise Cruz will refer pt to pt's PCP for eval and treatment.

## 2015-09-12 IMAGING — US US EXTREM LOW VENOUS BILAT
1 series · 13 of 24 positions shown · non-contrast
Comparison: Prior duplex venous ultrasound [DATE]

CLINICAL DATA: 65-year-old female with bilateral lower extremity
edema



[Series 1: us extrem low venous bilat · 0.07mm/px · 13 of 65 slices shown]
[im 1/65]
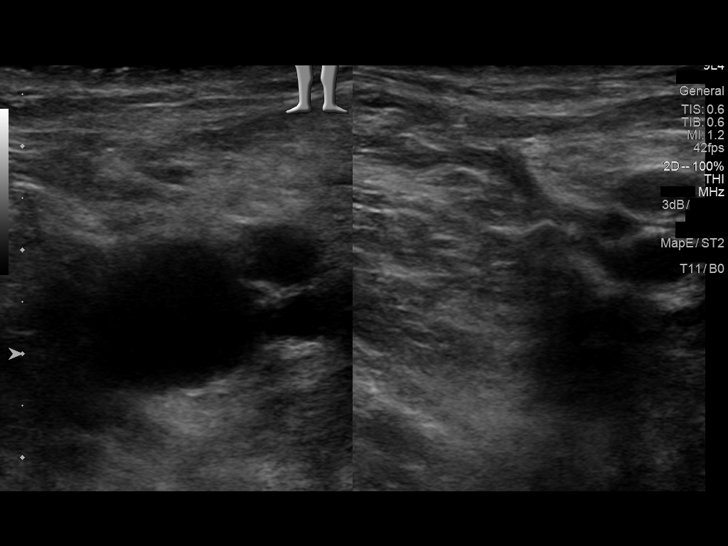
[im 6/65]
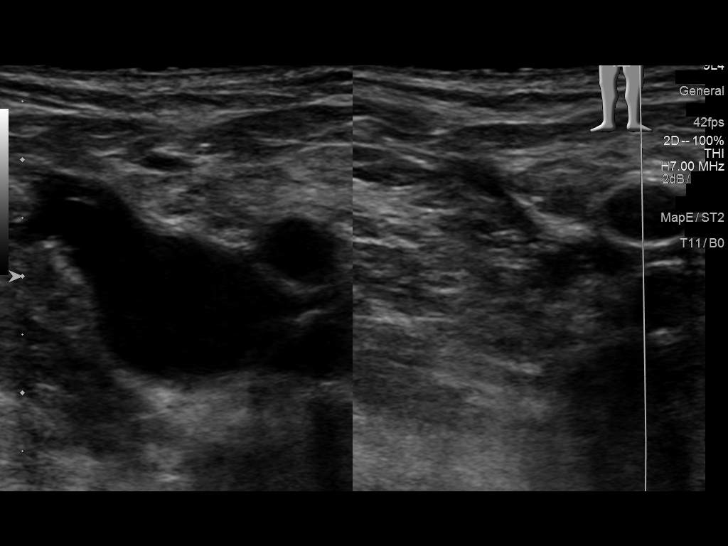
[im 12/65]
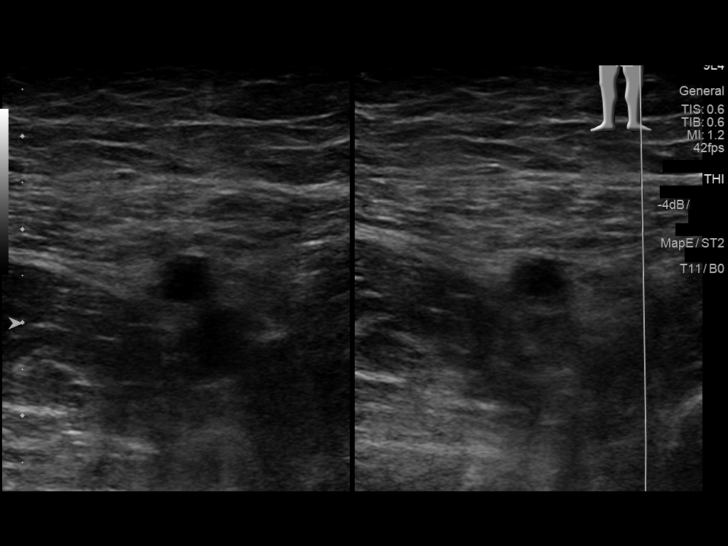
[im 17/65]
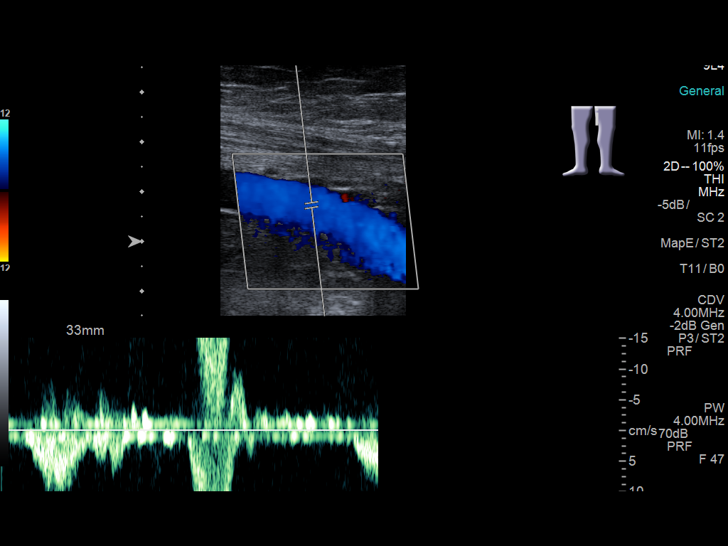
[im 23/65]
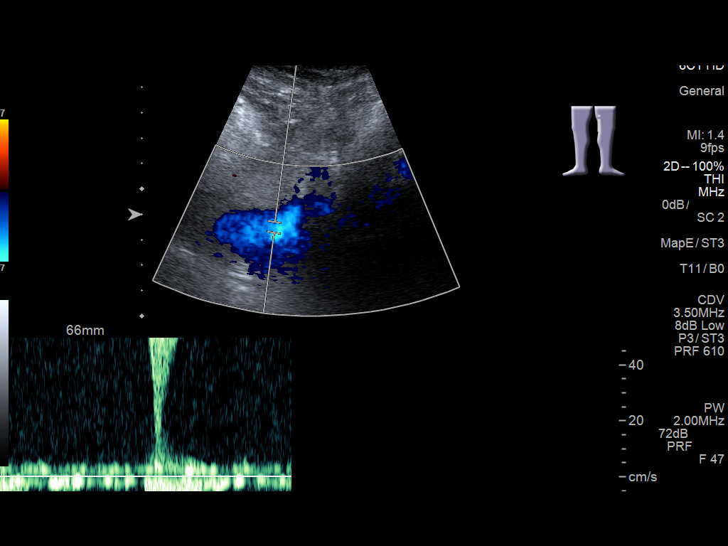
[im 28/65]
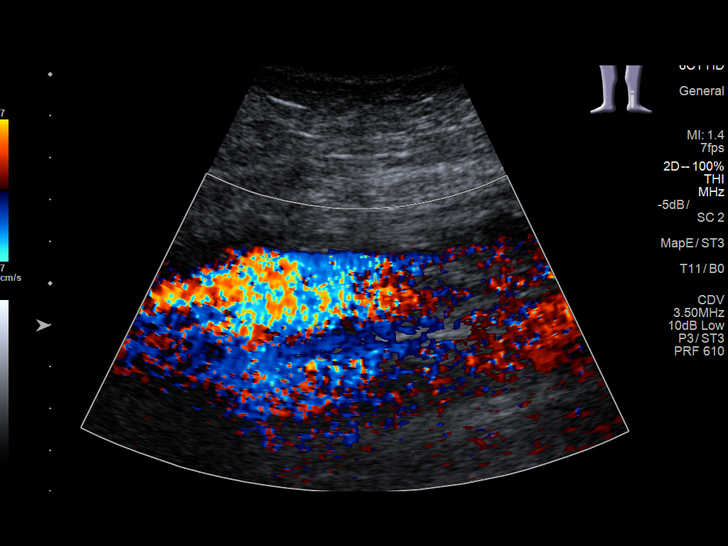
[im 34/65]
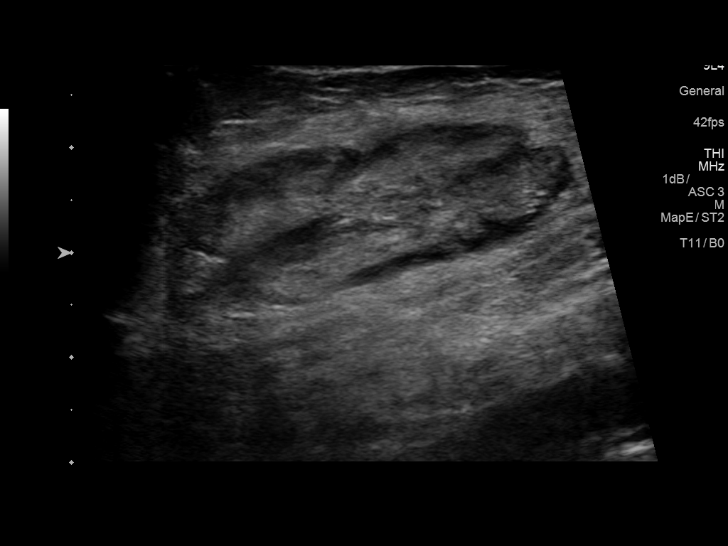
[im 37/65]
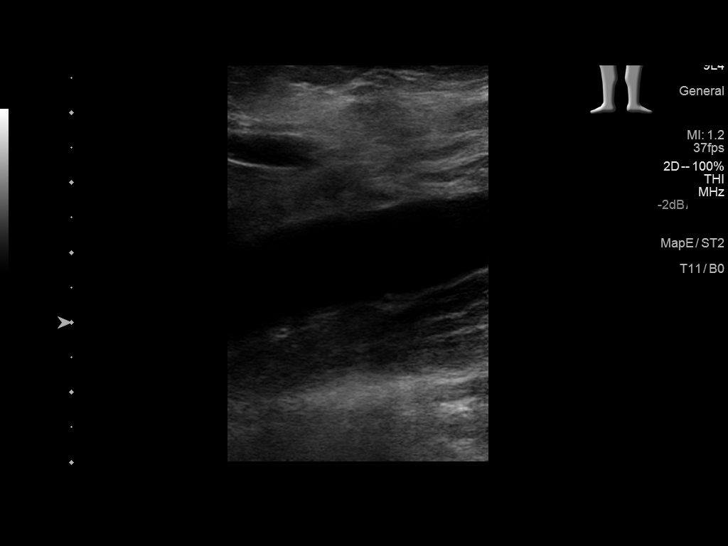
[im 42/65]
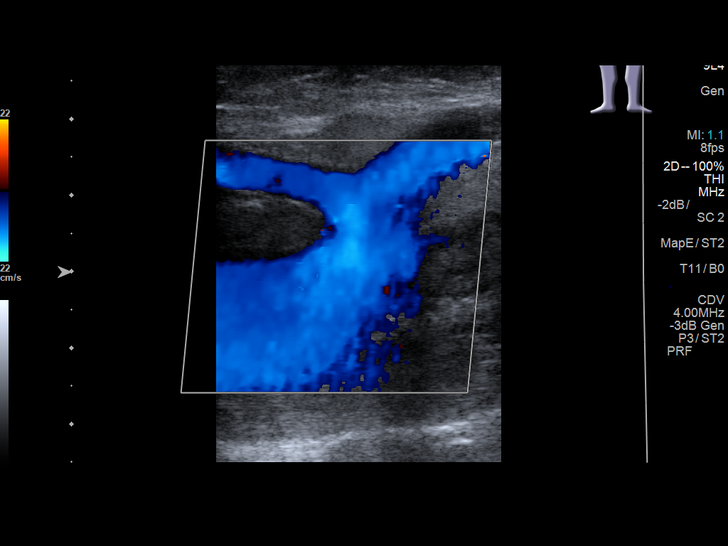
[im 48/65]
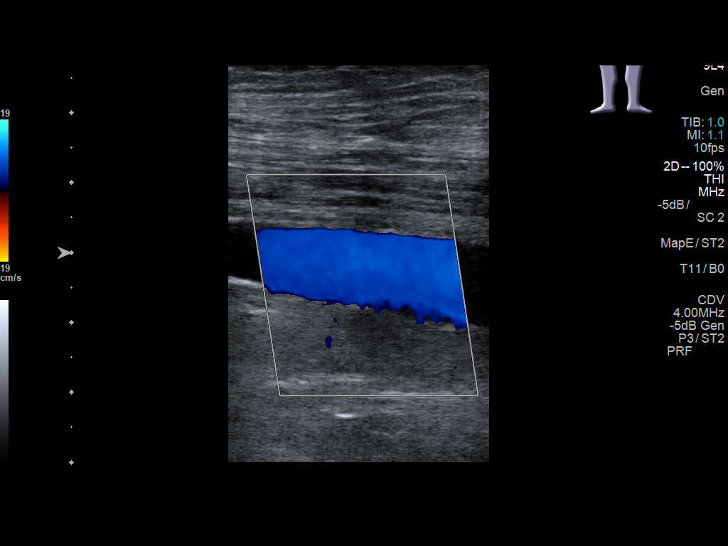
[im 53/65]
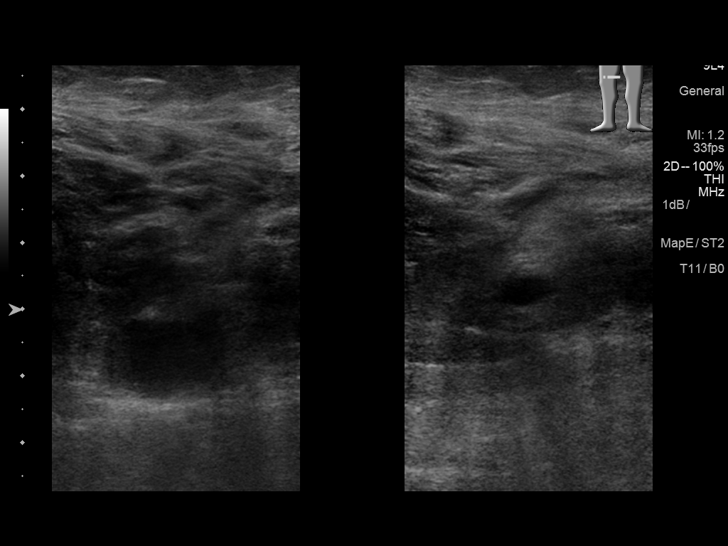
[im 59/65]
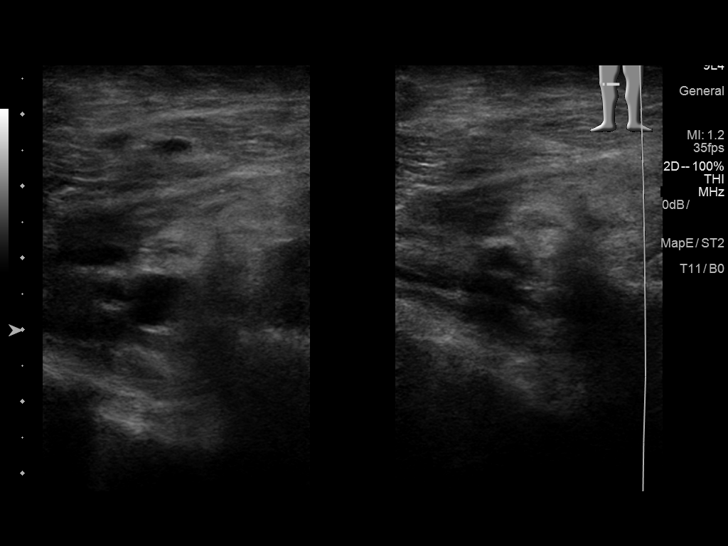
[im 65/65]
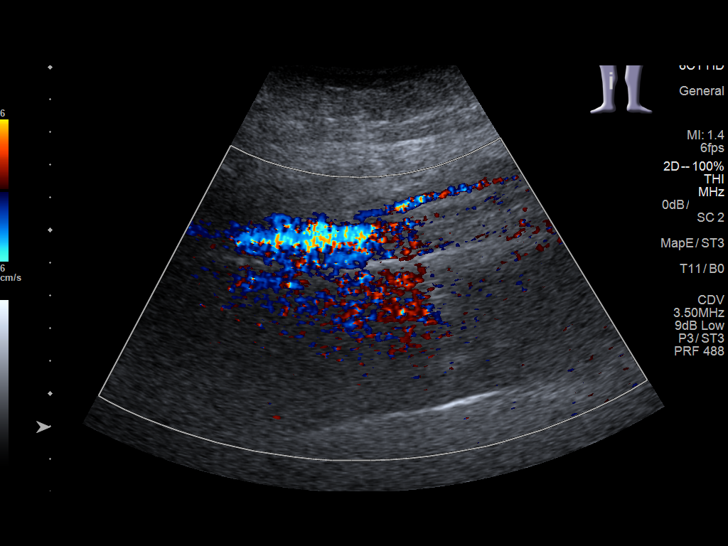

[13 of 24 positions shown; findings below may reference images not displayed]

FINDINGS: RIGHT LOWER EXTREMITY

Common Femoral Vein: No evidence of thrombus. Normal
compressibility, respiratory phasicity and response to augmentation.

Saphenofemoral Junction: No evidence of thrombus. Normal
compressibility and flow on color Doppler imaging.

Profunda Femoral Vein: No evidence of thrombus. Normal
compressibility and flow on color Doppler imaging.

Femoral Vein: No evidence of thrombus. Normal compressibility,
respiratory phasicity and response to augmentation.

Popliteal Vein: No evidence of thrombus. Normal compressibility,
respiratory phasicity and response to augmentation.

Calf Veins: Limited visualization of the peroneal veins. No evidence
of thrombus in the posterior tibial veins.

Superficial Great Saphenous Vein: No evidence of thrombus. Normal
compressibility and flow on color Doppler imaging.

Venous Reflux:  None.

Other Findings:  None.

LEFT LOWER EXTREMITY

Common Femoral Vein: No evidence of thrombus. Normal
compressibility, respiratory phasicity and response to augmentation.

Saphenofemoral Junction: No evidence of thrombus. Normal
compressibility and flow on color Doppler imaging.

Profunda Femoral Vein: No evidence of thrombus. Normal
compressibility and flow on color Doppler imaging.

Femoral Vein: No evidence of thrombus. Normal compressibility,
respiratory phasicity and response to augmentation.

Popliteal Vein: No evidence of thrombus. Normal compressibility,
respiratory phasicity and response to augmentation.

Calf Veins: Limited visualization of the peroneal veins. No evidence
of thrombus in the posterior tibial veins.

Superficial Great Saphenous Vein: No evidence of thrombus. Normal
compressibility and flow on color Doppler imaging.

Venous Reflux:  None.

Other Findings: Larger reactive lymph nodes in the superficial right
inguinal nodal station.
IMPRESSION: 1. No evidence of deep venous thrombosis.
2. Reactive lymph nodes in the right superficial inguinal station.

## 2015-09-15 NOTE — Pre-Procedure Instructions (Signed)
Spoke with Ivin Booty at Dr. Mina Marble' office regarding H+P and pre-op orders.

## 2015-09-15 NOTE — Pre-Procedure Instructions (Deleted)
Spoke with Ivin Booty at Dr. Mina Marble' office regarding [facility] order of 100 mg po 1 hour prior to surgery.  [facility] does not stock this med in 100 mg tabs.  Left message that [facility] stocks 85 and 125 mg.  Please advise,

## 2015-09-23 ENCOUNTER — Encounter: Payer: Self-pay | Admitting: *Deleted

## 2015-09-23 ENCOUNTER — Inpatient Hospital Stay: Payer: Medicare HMO | Admitting: Anesthesiology

## 2015-09-23 ENCOUNTER — Inpatient Hospital Stay
Admission: AD | Admit: 2015-09-23 | Discharge: 2015-09-25 | DRG: 620 | Disposition: A | Discharge status: Home / Self Care | Payer: Medicare HMO | Source: Ambulatory Visit | Attending: Specialist | Admitting: Specialist

## 2015-09-23 ENCOUNTER — Encounter
Admission: AD | Disposition: A | Discharge status: Home / Self Care | Payer: Self-pay | Source: Ambulatory Visit | Attending: Specialist

## 2015-09-23 DIAGNOSIS — Z794 Long term (current) use of insulin: Secondary | ICD-10-CM | POA: Diagnosis not present

## 2015-09-23 DIAGNOSIS — I739 Peripheral vascular disease, unspecified: Secondary | ICD-10-CM | POA: Diagnosis present

## 2015-09-23 DIAGNOSIS — K449 Diaphragmatic hernia without obstruction or gangrene: Secondary | ICD-10-CM | POA: Diagnosis present

## 2015-09-23 DIAGNOSIS — Z853 Personal history of malignant neoplasm of breast: Secondary | ICD-10-CM

## 2015-09-23 DIAGNOSIS — I69354 Hemiplegia and hemiparesis following cerebral infarction affecting left non-dominant side: Secondary | ICD-10-CM | POA: Diagnosis not present

## 2015-09-23 DIAGNOSIS — K219 Gastro-esophageal reflux disease without esophagitis: Secondary | ICD-10-CM | POA: Diagnosis present

## 2015-09-23 DIAGNOSIS — I10 Essential (primary) hypertension: Secondary | ICD-10-CM | POA: Diagnosis present

## 2015-09-23 DIAGNOSIS — E119 Type 2 diabetes mellitus without complications: Secondary | ICD-10-CM | POA: Diagnosis present

## 2015-09-23 DIAGNOSIS — K8020 Calculus of gallbladder without cholecystitis without obstruction: Secondary | ICD-10-CM | POA: Diagnosis present

## 2015-09-23 HISTORY — PX: CHOLECYSTECTOMY: SHX55

## 2015-09-23 HISTORY — PX: HIATAL HERNIA REPAIR: SHX195

## 2015-09-23 HISTORY — PX: GASTRIC ROUX-EN-Y: SHX5262

## 2015-09-23 HISTORY — DX: Essential (primary) hypertension: I10

## 2015-09-23 LAB — CREATININE, SERUM
Creatinine, Ser: 0.66 mg/dL (ref 0.44–1.00)
GFR calc non Af Amer: >60 mL/min (ref >60)

## 2015-09-23 LAB — CBC
HCT: 36.2 % (ref 35.0–47.0)
Hemoglobin: 11.9 g/dL — ABNORMAL LOW (ref 12.0–16.0)
MCH: 26.7 pg (ref 26.0–34.0)
MCHC: 33 g/dL (ref 32.0–36.0)
MCV: 80.9 fL (ref 80.0–100.0)
PLATELETS: 192 10*3/uL (ref 150–440)
RBC: 4.47 MIL/uL (ref 3.80–5.20)
RDW: 15.7 % — AB (ref 11.5–14.5)
WBC: 10.1 10*3/uL (ref 3.6–11.0)

## 2015-09-23 LAB — GLUCOSE, CAPILLARY
Glucose-Capillary: 162 mg/dL — ABNORMAL HIGH (ref 65–99)
Glucose-Capillary: 201 mg/dL — ABNORMAL HIGH (ref 65–99)
Glucose-Capillary: 270 mg/dL — ABNORMAL HIGH (ref 65–99)

## 2015-09-23 LAB — HEMOGLOBIN AND HEMATOCRIT, BLOOD
HEMATOCRIT: 42.3 % (ref 35.0–47.0)
Hemoglobin: 13.8 g/dL (ref 12.0–16.0)

## 2015-09-23 SURGERY — LAPAROSCOPIC ROUX-EN-Y GASTRIC
Anesthesia: General | Wound class: Clean Contaminated

## 2015-09-23 MED ORDER — GLYCOPYRROLATE 0.2 MG/ML IJ SOLN
INTRAMUSCULAR | Status: DC | PRN
  Administered 2015-09-23: 0.2 mg via INTRAVENOUS
  Administered 2015-09-23: 0.6 mg via INTRAVENOUS

## 2015-09-23 MED ORDER — SODIUM CHLORIDE 0.9 % IV SOLN
INTRAVENOUS | Status: DC
  Administered 2015-09-23 – 2015-09-25 (×5): via INTRAVENOUS

## 2015-09-23 MED ORDER — ONDANSETRON HCL 4 MG/2ML IJ SOLN: 4.0000 mg | INTRAMUSCULAR | Status: DC | PRN

## 2015-09-23 MED ORDER — SODIUM CHLORIDE 0.9 % IV SOLN
INTRAVENOUS | Status: DC
  Administered 2015-09-23: 13:00:00 via INTRAVENOUS

## 2015-09-23 MED ORDER — PANTOPRAZOLE SODIUM 40 MG IV SOLR
40.0000 mg | INTRAVENOUS | Status: DC
  Administered 2015-09-23 – 2015-09-24 (×2): 40 mg via INTRAVENOUS
  Filled 2015-09-23 (×2): qty 40

## 2015-09-23 MED ORDER — SCOPOLAMINE 1 MG/3DAYS TD PT72
1.0000 | MEDICATED_PATCH | TRANSDERMAL | Status: DC
  Administered 2015-09-23: 1.5 mg via TRANSDERMAL

## 2015-09-23 MED ORDER — BRIMONIDINE TARTRATE 0.2 % OP SOLN
1.0000 [drp] | Freq: Two times a day (BID) | OPHTHALMIC | Status: DC
  Administered 2015-09-23 – 2015-09-25 (×4): 1 [drp] via OPHTHALMIC
  Filled 2015-09-23: qty 5

## 2015-09-23 MED ORDER — TIMOLOL MALEATE 0.5 % OP SOLN
1.0000 [drp] | Freq: Two times a day (BID) | OPHTHALMIC | Status: DC
  Administered 2015-09-23 – 2015-09-25 (×4): 1 [drp] via OPHTHALMIC
  Filled 2015-09-23: qty 5

## 2015-09-23 MED ORDER — FAMOTIDINE 20 MG PO TABS
ORAL_TABLET | ORAL | Status: AC
  Administered 2015-09-23: 17:00:00
  Filled 2015-09-23: qty 1

## 2015-09-23 MED ORDER — ACETAMINOPHEN 10 MG/ML IV SOLN
1000.0000 mg | Freq: Once | INTRAVENOUS | Status: AC
  Administered 2015-09-23: 1000 mg via INTRAVENOUS

## 2015-09-23 MED ORDER — FAMOTIDINE 20 MG PO TABS
20.0000 mg | ORAL_TABLET | Freq: Once | ORAL | Status: AC
  Administered 2015-09-23: 20 mg via ORAL

## 2015-09-23 MED ORDER — AMLODIPINE BESYLATE 10 MG PO TABS
10.0000 mg | ORAL_TABLET | Freq: Every day | ORAL | Status: DC
  Administered 2015-09-24: 10 mg via ORAL
  Filled 2015-09-23 (×2): qty 1

## 2015-09-23 MED ORDER — BUPIVACAINE-EPINEPHRINE (PF) 0.5% -1:200000 IJ SOLN
INTRAMUSCULAR | Status: DC | PRN
  Administered 2015-09-23: 20 mL

## 2015-09-23 MED ORDER — EPHEDRINE SULFATE 50 MG/ML IJ SOLN
INTRAMUSCULAR | Status: DC | PRN
  Administered 2015-09-23: 10 mg via INTRAVENOUS
  Administered 2015-09-23: 5 mg via INTRAVENOUS

## 2015-09-23 MED ORDER — DEXAMETHASONE SODIUM PHOSPHATE 4 MG/ML IJ SOLN
INTRAMUSCULAR | Status: DC | PRN
  Administered 2015-09-23: 10 mg via INTRAVENOUS

## 2015-09-23 MED ORDER — MORPHINE SULFATE (PF) 2 MG/ML IV SOLN
2.0000 mg | INTRAVENOUS | Status: DC | PRN
  Administered 2015-09-23: 2 mg via INTRAVENOUS
  Filled 2015-09-23: qty 1

## 2015-09-23 MED ORDER — SULFAMETHOXAZOLE-TRIMETHOPRIM 800-160 MG PO TABS
1.0000 | ORAL_TABLET | Freq: Two times a day (BID) | ORAL | Status: DC
  Administered 2015-09-24 – 2015-09-25 (×3): 1 via ORAL
  Filled 2015-09-23 (×4): qty 1

## 2015-09-23 MED ORDER — CEFOXITIN SODIUM-DEXTROSE 2-2.2 GM-% IV SOLR (PREMIX)
2.0000 g | Freq: Once | INTRAVENOUS | Status: AC
  Administered 2015-09-23: 2 g via INTRAVENOUS

## 2015-09-23 MED ORDER — ATENOLOL 50 MG PO TABS
50.0000 mg | ORAL_TABLET | Freq: Every day | ORAL | Status: DC
  Administered 2015-09-24: 50 mg via ORAL
  Filled 2015-09-23: qty 1

## 2015-09-23 MED ORDER — INSULIN GLARGINE 100 UNIT/ML ~~LOC~~ SOLN
10.0000 [IU] | Freq: Once | SUBCUTANEOUS | Status: AC
  Administered 2015-09-23: 10 [IU] via SUBCUTANEOUS
  Filled 2015-09-23: qty 0.1

## 2015-09-23 MED ORDER — INSULIN ASPART 100 UNIT/ML ~~LOC~~ SOLN
0.0000 [IU] | Freq: Three times a day (TID) | SUBCUTANEOUS | Status: DC
  Administered 2015-09-24: 3 [IU] via SUBCUTANEOUS
  Administered 2015-09-24: 5 [IU] via SUBCUTANEOUS
  Administered 2015-09-24: 3 [IU] via SUBCUTANEOUS
  Administered 2015-09-25: 2 [IU] via SUBCUTANEOUS
  Administered 2015-09-25: 3 [IU] via SUBCUTANEOUS
  Filled 2015-09-23: qty 3
  Filled 2015-09-23: qty 5
  Filled 2015-09-23 (×2): qty 3
  Filled 2015-09-23: qty 2

## 2015-09-23 MED ORDER — ONDANSETRON HCL 4 MG/2ML IJ SOLN: 4.0000 mg | Freq: Once | INTRAMUSCULAR | Status: DC | PRN

## 2015-09-23 MED ORDER — ENOXAPARIN SODIUM 40 MG/0.4ML ~~LOC~~ SOLN: 40.0000 mg | SUBCUTANEOUS | Status: DC

## 2015-09-23 MED ORDER — CEFOXITIN SODIUM 2 G IV SOLR
2.0000 g | Freq: Three times a day (TID) | INTRAVENOUS | Status: AC
  Administered 2015-09-23: 2 g via INTRAVENOUS
  Filled 2015-09-23 (×2): qty 2

## 2015-09-23 MED ORDER — INSULIN ASPART 100 UNIT/ML ~~LOC~~ SOLN
0.0000 [IU] | Freq: Every day | SUBCUTANEOUS | Status: DC
  Administered 2015-09-23: 3 [IU] via SUBCUTANEOUS
  Filled 2015-09-23: qty 3

## 2015-09-23 MED ORDER — ONDANSETRON 4 MG PO TBDP: 4.0000 mg | ORAL_TABLET | ORAL | Status: DC | PRN

## 2015-09-23 MED ORDER — METOPROLOL TARTRATE 1 MG/ML IV SOLN: 5.0000 mg | INTRAVENOUS | Status: DC | PRN

## 2015-09-23 MED ORDER — ACETAMINOPHEN 160 MG/5ML PO SOLN: 650.0000 mg | ORAL | Status: DC | PRN

## 2015-09-23 MED ORDER — PROPOFOL 10 MG/ML IV BOLUS
INTRAVENOUS | Status: DC | PRN
  Administered 2015-09-23: 150 mg via INTRAVENOUS

## 2015-09-23 MED ORDER — HYDROCODONE-ACETAMINOPHEN 7.5-325 MG/15ML PO SOLN: 10.0000 mL | Freq: Four times a day (QID) | ORAL | Status: DC | PRN

## 2015-09-23 MED ORDER — ROCURONIUM BROMIDE 100 MG/10ML IV SOLN
INTRAVENOUS | Status: DC | PRN
  Administered 2015-09-23: 20 mg via INTRAVENOUS
  Administered 2015-09-23: 30 mg via INTRAVENOUS
  Administered 2015-09-23: 10 mg via INTRAVENOUS

## 2015-09-23 MED ORDER — FENTANYL CITRATE (PF) 100 MCG/2ML IJ SOLN
25.0000 ug | INTRAMUSCULAR | Status: DC | PRN
  Administered 2015-09-23 (×3): 25 ug via INTRAVENOUS

## 2015-09-23 MED ORDER — ACETAMINOPHEN 160 MG/5ML PO SOLN
325.0000 mg | ORAL | Status: DC | PRN
  Filled 2015-09-23: qty 20.3

## 2015-09-23 MED ORDER — METOPROLOL TARTRATE 1 MG/ML IV SOLN
5.0000 mg | Freq: Once | INTRAVENOUS | Status: AC
  Administered 2015-09-23: 5 mg via INTRAVENOUS
  Filled 2015-09-23: qty 5

## 2015-09-23 MED ORDER — LIDOCAINE-EPINEPHRINE (PF) 1 %-1:200000 IJ SOLN
INTRAMUSCULAR | Status: DC | PRN
  Administered 2015-09-23: 16 mL

## 2015-09-23 MED ORDER — MIDAZOLAM HCL 2 MG/2ML IJ SOLN
INTRAMUSCULAR | Status: DC | PRN
  Administered 2015-09-23: 2 mg via INTRAVENOUS

## 2015-09-23 MED ORDER — NEOSTIGMINE METHYLSULFATE 10 MG/10ML IV SOLN
INTRAVENOUS | Status: DC | PRN
  Administered 2015-09-23: 4 mg via INTRAVENOUS

## 2015-09-23 MED ORDER — SUCCINYLCHOLINE CHLORIDE 20 MG/ML IJ SOLN
INTRAMUSCULAR | Status: DC | PRN
  Administered 2015-09-23: 100 mg via INTRAVENOUS

## 2015-09-23 MED ORDER — FENTANYL CITRATE (PF) 100 MCG/2ML IJ SOLN
INTRAMUSCULAR | Status: DC | PRN
  Administered 2015-09-23: 100 ug via INTRAVENOUS
  Administered 2015-09-23 (×3): 50 ug via INTRAVENOUS

## 2015-09-23 MED ORDER — ALBUTEROL SULFATE (2.5 MG/3ML) 0.083% IN NEBU: 3.0000 mL | INHALATION_SOLUTION | Freq: Four times a day (QID) | RESPIRATORY_TRACT | Status: DC | PRN

## 2015-09-23 MED ORDER — ACETAMINOPHEN 10 MG/ML IV SOLN
1000.0000 mg | Freq: Four times a day (QID) | INTRAVENOUS | Status: AC
  Administered 2015-09-23 – 2015-09-24 (×3): 1000 mg via INTRAVENOUS
  Filled 2015-09-23 (×4): qty 100

## 2015-09-23 MED ORDER — ENOXAPARIN SODIUM 30 MG/0.3ML ~~LOC~~ SOLN
30.0000 mg | Freq: Two times a day (BID) | SUBCUTANEOUS | Status: DC
  Administered 2015-09-24 – 2015-09-25 (×3): 30 mg via SUBCUTANEOUS
  Filled 2015-09-23 (×3): qty 0.3

## 2015-09-23 MED ORDER — OXYCODONE HCL 5 MG/5ML PO SOLN
5.0000 mg | ORAL | Status: DC | PRN
  Administered 2015-09-24: 10 mg via ORAL
  Filled 2015-09-23: qty 10

## 2015-09-23 SURGICAL SUPPLY — 76 items
ANCHOR TIS RET SYS 235ML (MISCELLANEOUS) ×3 IMPLANT
APPLIER CLIP ROT 10 11.4 M/L (STAPLE) ×3
APPLIER CLIP ROT 13.4 12 LRG (CLIP) ×3
BANDAGE ELASTIC 6 LF NS (GAUZE/BANDAGES/DRESSINGS) ×6 IMPLANT
BLADE SURG SZ11 CARB STEEL (BLADE) ×3 IMPLANT
CANISTER SUCT 1200ML W/VALVE (MISCELLANEOUS) ×3 IMPLANT
CANNULA DILATOR 10 W/SLV (CANNULA) ×2 IMPLANT
CANNULA DILATOR 10MM W/SLV (CANNULA) ×1
CATH REDDICK CHOLANGI 4FR 50CM (CATHETERS) IMPLANT
CHLORAPREP W/TINT 26ML (MISCELLANEOUS) ×6 IMPLANT
CLIP APPLIE ROT 10 11.4 M/L (STAPLE) ×1 IMPLANT
CLIP APPLIE ROT 13.4 12 LRG (CLIP) ×1 IMPLANT
CLOSURE WOUND 1/2 X4 (GAUZE/BANDAGES/DRESSINGS) ×1
DECANTER SPIKE VIAL GLASS SM (MISCELLANEOUS) ×6 IMPLANT
DEFOGGER SCOPE WARMER CLEARIFY (MISCELLANEOUS) IMPLANT
DRAPE SHEET LG 3/4 BI-LAMINATE (DRAPES) ×3 IMPLANT
DRAPE UTILITY 15X26 TOWEL STRL (DRAPES) ×6 IMPLANT
DRSG TEGADERM 4X4.75 (GAUZE/BANDAGES/DRESSINGS) ×3 IMPLANT
FILTER LAP SMOKE EVAC STRL (MISCELLANEOUS) ×3 IMPLANT
GAUZE SPONGE 4X4 12PLY STRL (GAUZE/BANDAGES/DRESSINGS) ×3 IMPLANT
GLOVE BIO SURGEON STRL SZ 6.5 (GLOVE) ×6 IMPLANT
GLOVE BIO SURGEON STRL SZ8 (GLOVE) ×9 IMPLANT
GLOVE BIO SURGEONS STRL SZ 6.5 (GLOVE) ×3
GOWN STRL REUS W/ TWL LRG LVL3 (GOWN DISPOSABLE) ×3 IMPLANT
GOWN STRL REUS W/ TWL XL LVL3 (GOWN DISPOSABLE) ×2 IMPLANT
GOWN STRL REUS W/TWL LRG LVL3 (GOWN DISPOSABLE) ×6
GOWN STRL REUS W/TWL XL LVL3 (GOWN DISPOSABLE) ×4
IRRIGATION STRYKERFLOW (MISCELLANEOUS) ×1 IMPLANT
IRRIGATOR STRYKERFLOW (MISCELLANEOUS) ×3
IV NS 1000ML (IV SOLUTION) ×2
IV NS 1000ML BAXH (IV SOLUTION) ×1 IMPLANT
KIT RM TURNOVER STRD PROC AR (KITS) ×3 IMPLANT
LABEL OR SOLS (LABEL) ×3 IMPLANT
LIQUID BAND (GAUZE/BANDAGES/DRESSINGS) ×6 IMPLANT
NDL INSUFF 14G 150MM VS150000 (NEEDLE) ×3 IMPLANT
NDL INSUFF ACCESS 14 VERSASTEP (NEEDLE) ×3 IMPLANT
NDL SAFETY 22GX1.5 (NEEDLE) ×3 IMPLANT
NEEDLE FILTER BLUNT 18X 1/2SAF (NEEDLE)
NEEDLE FILTER BLUNT 18X1 1/2 (NEEDLE) IMPLANT
NS IRRIG 1000ML POUR BTL (IV SOLUTION) ×3 IMPLANT
NS IRRIG 500ML POUR BTL (IV SOLUTION) ×3 IMPLANT
PACK LAP CHOLECYSTECTOMY (MISCELLANEOUS) ×3 IMPLANT
PAD GROUND ADULT SPLIT (MISCELLANEOUS) ×3 IMPLANT
RELOAD BLUE (STAPLE) ×3 IMPLANT
RELOAD STAPLER GOLD 60MM (STAPLE) ×3 IMPLANT
RELOAD STAPLER WHITE 60MM (STAPLE) ×3 IMPLANT
SCISSORS METZENBAUM CVD 33 (INSTRUMENTS) IMPLANT
SEAL FOR SCOPE WARMER C3101 (MISCELLANEOUS) IMPLANT
SHEARS HARMONIC ACE PLUS 45CM (MISCELLANEOUS) ×3 IMPLANT
SLEEVE ENDOPATH XCEL 5M (ENDOMECHANICALS) ×6 IMPLANT
SLEEVE GASTRECTOMY 36FR VISIGI (MISCELLANEOUS) ×3 IMPLANT
STAPLER ECHELON BIOABSB 60 FLE (MISCELLANEOUS) ×18 IMPLANT
STAPLER ECHELON LONG 60 440 (INSTRUMENTS) ×3 IMPLANT
STAPLER RELOAD GOLD 60MM (STAPLE) ×9
STAPLER RELOAD WHITE 60MM (STAPLE) ×9
STRIP CLOSURE SKIN 1/2X4 (GAUZE/BANDAGES/DRESSINGS) ×2 IMPLANT
SUT CHROMIC 5 0 RB 1 27 (SUTURE) ×3 IMPLANT
SUT DEVICE BRAIDED 0X39 (SUTURE) ×6 IMPLANT
SUT DEVICE BRAIDED 2.0X39 (SUTURE) ×15 IMPLANT
SUT ENDOSTITCH SURGIDAC 2-0 GR (SUTURE) ×12
SUT MNCRL 4-0 (SUTURE) ×4
SUT MNCRL 4-0 27XMFL (SUTURE) ×2
SUT VIC AB 0 CT2 27 (SUTURE) ×3 IMPLANT
SUT VIC AB 4-0 PS2 18 (SUTURE) ×6 IMPLANT
SUTURE ENDSITCH SURGIDC 2-0 GR (SUTURE) ×4 IMPLANT
SUTURE MNCRL 4-0 27XMF (SUTURE) ×2 IMPLANT
SYR 20CC LL (SYRINGE) ×3 IMPLANT
SYR 3ML LL SCALE MARK (SYRINGE) ×3 IMPLANT
TROCAR SL VERSASTEP 5M LG  B (MISCELLANEOUS) ×2
TROCAR SL VERSASTEP 5M LG B (MISCELLANEOUS) ×1 IMPLANT
TROCAR XCEL 12X100 BLDLESS (ENDOMECHANICALS) ×3 IMPLANT
TROCAR XCEL NON-BLD 11X100MML (ENDOMECHANICALS) ×3 IMPLANT
TROCAR XCEL NON-BLD 5MMX100MML (ENDOMECHANICALS) ×3 IMPLANT
TUBING INSUFFLATOR HEATED (MISCELLANEOUS) ×3 IMPLANT
TUBING INSUFFLATOR HI FLOW (MISCELLANEOUS) ×3 IMPLANT
WATER STERILE IRR 1000ML POUR (IV SOLUTION) ×3 IMPLANT

## 2015-09-23 NOTE — Transfer of Care (Signed)
Immediate Anesthesia Transfer of Care Note  Patient: Elizabeth [name]  Procedure(s) Performed: Procedure(s): LAPAROSCOPIC ROUX-EN-Y GASTRIC (N/A) LAPAROSCOPIC CHOLECYSTECTOMY (N/A) LAPAROSCOPIC REPAIR OF HIATAL HERNIA (N/A)  Patient Location: PACU  Anesthesia Type:General  Level of Consciousness: sedated and patient cooperative  Airway & Oxygen Therapy: Patient Spontanous Breathing and Patient connected to face mask oxygen  Post-op Assessment: Report given to RN and Post -op Vital signs reviewed and stable  Post vital signs: Reviewed and stable  Last Vitals:  Filed Vitals:   09/23/15 1547  BP: 178/80  Pulse: 79  Temp: 36.7 C  Resp: 23    Complications: No apparent anesthesia complications

## 2015-09-23 NOTE — H&P (Signed)
  See faxed notes Heart and lungs normal

## 2015-09-23 NOTE — Progress Notes (Signed)
Discussed with Dr Kayleen Memos re vital signs and pt advising she had not taken her lisinipril or atenolol "in about a week", given preop 1257 pm (pt's own med, along with Famotidine given as ordered by Dr Kayleen Memos when notified she had not taken her ranitidine at home this morning either.  Pt advises scratch marks on her stomach "probably from my cat", and scabbed over area right lower leg "been there a while".  Discussed with Dr Darnell Level when in to see, as well as notified him her nasal swab + MRSA, advises to continue with Cefoxitin as ordered, "I'll give her another antibiotic while she's here."  Notified him office was notified of + MRSA and that pt advised she was sent to her primary care MD, who put her on Bactrim.    Per Thayer Headings, CRNA, when in to see pt, she will start abx and IV tylenol in the OR.  Sacral dressing applied prior to transport as instructed by Thayer Headings.  To OR with Bair Paws, thermal cap, SCDS in place.

## 2015-09-23 NOTE — Anesthesia Preprocedure Evaluation (Signed)
Anesthesia Evaluation  Patient identified by MRN, date of birth, ID band Patient awake    Reviewed: Allergy & Precautions, NPO status , Patient's Chart, lab work & pertinent test results, reviewed documented beta blocker date and time   History of Anesthesia Complications (+) PONV and history of anesthetic complications  Airway Mallampati: II  TM Distance: <3 FB     Dental  (+) Chipped   Pulmonary asthma ,  COPD inhaler,    Pulmonary exam normal breath sounds clear to auscultation       Cardiovascular hypertension, + Peripheral Vascular Disease  Normal cardiovascular exam     Neuro/Psych Depression CVA, Residual Symptoms    GI/Hepatic Neg liver ROS, GERD  Medicated and Controlled,Hx of diverticulosis   Endo/Other  diabetes, Well Controlled, Type 2, Oral Hypoglycemic Agents  Renal/GU negative Renal ROS  negative genitourinary   Musculoskeletal   Abdominal (+) + obese,   Peds negative pediatric ROS (+)  Hematology negative hematology ROS (+)   Anesthesia Other Findings CA of Right Breast...morbid obesity... Cat scratches on belly  Reproductive/Obstetrics                             Anesthesia Physical Anesthesia Plan  ASA: III  Anesthesia Plan: General   Post-op Pain Management:    Induction: Intravenous and Rapid sequence  Airway Management Planned: Oral ETT  Additional Equipment:   Intra-op Plan:   Post-operative Plan: Extubation in OR  Informed Consent: I have reviewed the patients History and Physical, chart, labs and discussed the procedure including the risks, benefits and alternatives for the proposed anesthesia with the patient or authorized representative who has indicated his/her understanding and acceptance.   Dental advisory given  Plan Discussed with: CRNA and Surgeon  Anesthesia Plan Comments:         Anesthesia Quick Evaluation

## 2015-09-23 NOTE — Consult Note (Signed)
Reason for Consult: No chief complaint on file.  Referring Physician: Dr. Sandria Manly is an 64 y.o. female.  HPI: Mr. Fiqueroa is a 64 year old Caucasian female with past medical history of insulin requiring diabetes mellitus, essential hypertension, breast cancer and peripheral vascular disease is admitted to surgical services and had gastric bypass surgery today. Hospitalist team is consulted regarding the medical management of her hypertension and diabetes with this. I have seen the patient postoperatively. Patient is in pain and requesting pain medication. Denies any chest pain or shortness of breath. Denies any cardiac history in the past. No other complaints  Past Medical History  Diagnosis Date  . Varicose veins   . Diabetes mellitus without complication ([place])   . Lymphedema   . PONV (postoperative nausea and vomiting)   . Stroke ([place]) 2000    residual left arm weakness  . Cancer [facility])     breast  . Carcinoma of right breast ([place]) 05/20/2014  . Psoriasis 1990    Past Surgical History  Procedure Laterality Date  . Breast reduction surgery  1977  . Breast excisional biopsy Right 2015    positive, with radiation  . Breast lumpectomy Right 04-2014    followed by radiation,  no chemo  . Eye surgery Left 2015    cataracts and left eye detached retina repair  . Eye surgery Right 2013    cataract with len implant  . Dilation and curettage of uterus  2010    Family History  Problem Relation Age of Onset  . Diabetes Mother   . Cancer Mother   . Breast cancer Mother 66  . Diabetes Father   . Diabetes Sister   . Diabetes Brother     Social History:  reports that she has never smoked. She does not have any smokeless tobacco history on file. She reports that she does not drink alcohol or use illicit drugs.  Allergies: No Known Allergies  Medications: I have reviewed the patient's current medications.  Results for orders placed or performed during the hospital  encounter of 09/23/15 (from the past 48 hour(s))  Glucose, capillary     Status: Abnormal   Collection Time: 09/23/15 12:02 PM  Result Value Ref Range   Glucose-Capillary 162 (H) 65 - 99 mg/dL  Glucose, capillary     Status: Abnormal   Collection Time: 09/23/15  3:52 PM  Result Value Ref Range   Glucose-Capillary 201 (H) 65 - 99 mg/dL  Hemoglobin and hematocrit, blood     Status: None   Collection Time: 09/23/15  4:38 PM  Result Value Ref Range   Hemoglobin 13.8 12.0 - 16.0 g/dL   HCT 42.3 35.0 - 47.0 %    No results found.  ROS:  CONSTITUTIONAL: Denies fevers, chills. Denies any fatigue, weakness.  EYES: Denies blurry vision, double vision, eye pain. EARS, NOSE, THROAT: Denies tinnitus, ear pain, hearing loss. RESPIRATORY: Denies cough, wheeze, shortness of breath.  CARDIOVASCULAR: Denies chest pain, palpitations, edema.  GASTROINTESTINAL: Reporting generalized abdominal pain. Denies nausea, vomiting, diarrhea, . Denies bright red blood per rectum. GENITOURINARY: Denies dysuria, hematuria. ENDOCRINE: Denies nocturia or thyroid problems. HEMATOLOGIC AND LYMPHATIC: Denies easy bruising or bleeding. SKIN: Denies rash or lesion. MUSCULOSKELETAL: Denies pain in neck, back, shoulder, knees, hips or arthritic symptoms.  NEUROLOGIC: Denies paralysis, paresthesias.  PSYCHIATRIC: Denies anxiety or depressive symptoms. Blood pressure 178/81, pulse 92, temperature 98 F (36.7 C), temperature source Oral, resp. rate 12, height 5\' 3"  (1.6 m), weight 113.399  kg (250 lb), SpO2 98 %.   PHYSICAL EXAMINATION:  GENERAL: Well-nourished, well-developed  currently in no acute distress.  HEAD: Normocephalic, atraumatic.  EYES: Pupils equal, round, and reactive to light. Extraocular muscles intact. No scleral icterus.  MOUTH: Moist mucosal membranes. Dentition intact. No abscess noted. EARS, NOSE, THROAT: Clear without exudates. No external lesions.  NECK: Supple. No thyromegaly. No nodules. No  JVD.  PULMONARY: Clear to auscultation bilaterally without wheezes, rales, or rhonchi. No use of accessory muscles. Good respiratory effort. CHEST: Nontender to palpation.  CARDIOVASCULAR: S1, S2, regular rate and rhythm. No murmurs, rubs, or gallops.  GASTROINTESTINAL: Soft, diffusely tender, incisions look clean ,distended. No masses. Obese MUSCULOSKELETAL: No swelling, clubbing, edema. Range of motion full in all extremities. NEUROLOGIC: Cranial nerves II-XII intact. No gross focal neurological deficits. Sensation intact. Reflexes intact. SKIN: No ulcerations, lesions, rash, cyanosis. Skin warm, dry. Turgor intact. PSYCHIATRIC: Mood, affect within normal limits. Patient awake, alert, oriented x 3. Insight and judgment intact.   Assessment/Plan:  Patient is a 64 year old Caucasian female, had gastric bypass surgery and hospitalist team is consulted for medical management of hypertension and diabetes mellitus  1. Insulin requiring diabetes mellitus Patient is status post bariatric surgery postop day #0 Currently on bariatric liquid diet We will start the patient on moderate sliding scale insulin Will check hemoglobin A1c in a.m. Patient takes 55 units of 70/30 in am and 40 units at bedtime at home . Currently patient is on liquid diet   we will give her Lantus 10 units for basal coverage and titrate as needed basis   consult is placed to diabetic coordinator    2. Essential hypertension Continue home medications atenolol 50 mg by mouth once daily, hold lisinopril and hydrochlorothiazide Provide Lopressor 5 mg IV as needed basis  3. GERD Provide GI per flexes and Protonix  4. Chronic peripheral vascular disease Resume home medications on's patient starts tolerating by mouth   5. Status post  bariatric surgery postop day #0 Management per surgery     TOTAL TIME TAKING CARE OF THIS PATIENT: 42 minutes.  @[facility] @ Pager - [phone] 09/23/2015, 7:31 PM     ,

## 2015-09-23 NOTE — Anesthesia Procedure Notes (Signed)
Procedure Name: Intubation Date/Time: 09/23/2015 1:27 PM Performed by: Jonna Clark Pre-anesthesia Checklist: Patient identified, Patient being monitored, Timeout performed, Emergency Drugs available and Suction available Patient Re-evaluated:Patient Re-evaluated prior to inductionOxygen Delivery Method: Circle system utilized Preoxygenation: Pre-oxygenation with 100% oxygen Intubation Type: IV induction Ventilation: Mask ventilation without difficulty Laryngoscope Size: Miller and 2 Grade View: Grade I Tube type: Oral Tube size: 7.0 mm Number of attempts: 1 Placement Confirmation: ETT inserted through vocal cords under direct vision,  positive ETCO2 and breath sounds checked- equal and bilateral Secured at: 20 cm Tube secured with: Tape Dental Injury: Teeth and Oropharynx as per pre-operative assessment

## 2015-09-23 NOTE — Op Note (Signed)
Preoperative diagnosis: Morbid obesity; hiatal hernia; gallstones Postoperative diagnosis: Same Procedure: Laparoscopic Roux-en-Y gastric bypass; laparoscopic hiatal hernia repair; Cholecystectomy Surgeon: Darnell Level Assistant: Lorenda Cahill Complications: None Blood loss: 10 cc Anesthesia: Gen. endotracheal Drains: None Specimens: Gallbladder  Clinical history: See history and physical  Details of procedure: Patient was taken to the operating room placed in the operating table in supine position.  Broad-spectrum IV antibiotics were given. The patient was placed under general anesthesia without . Timeout was performed. Sequential stockings were placed. The abdomen was prepped and draped in usual sterile fashion.  The abdomen is accessed using a 5 mm optical trocar technique and left upper quadrant. Pneumoperitoneum was established without difficulty. Multiple other ports were placed in preparation for the procedure. Liver retractor was placed as well. The small bowel was measured 50 cm from the ligament of Treitz and divided with a S1 60 mm White load with seen guard. Mesenteries taken down with my scalpel to it . A 125 cm root limb was then measured in side-to-side enteroenterostomy is made with Bill pancreatic limb. A stay suture was placed to approximate the 2 limbs and enterotomies were made not a limp. And S1 White load was inserted to 60 mm and fired. This created a new lumen. The resulting defect was reapproximated using interrupted Surgidac suture and closed with a blue load stapler. Excess tissue was discarded. The mesenteric defect was then closed using a barbed 2-0 PDS suture. Once this was closed the right limb was mobilized after dividing the omentum in the midline up to the transverse colon. The limb was then tacked to the greater curvature the stomach using interrupted 2 Surgidac suture. Liver retractor was used to retract the liver and a hiatal hernia was identified. This was dissected free from  the pars flaccida using a my scalpel and the posterior dissection was performed to free up the right {from the vagus and posterior vagus nerve as well as the esophagus. The collateral and then reapproximated using interrupted Surgidac suture 0. This was done with a 36 French bougie in place. The mesentery of the lesser curvature of the stomach was then divided at the level of pancreatic fell using a echelon White load reinforced with seen guard stapler. A small gastric pouch was then created with 3 successive gold loads 60 mm stapler fire reinforced with seen guard. The small bowel was then mobilized to the underside of the pouch and enterotomies were made in both. A blue load was inserted into both to 30 mm or bilateral greater and fired. The defect was then closed using running 2-0 Polysorb suture. This was reinforced with a second layer of running 3-0 Polysorb suture over the bougie. While insufflating through the bougie. No leak was detected within under saline. The Peterson's defect was then closed using a running barbed suture of 2-0 PDS.  Attention was then placed toward the gallbladder. It was raised anteriorly and cephalad and the plane of this and the cystic duct and cystic artery were dissected free from surrounding tissues to the view of safety. The artery was divided using the harmonic scalpel. The duct was clipped twice proximally and once distally and divided sharply. The gallbladder was then removed off the liver bed using the my scalpel. No bowel spillage occurred. The bladder was decompressed slightly to fitted within the specimen bag of specimen bag was used to retrieve the gallbladder and stones. The liver bed was reinspected and no bleeding bile leak was present. The trochars removed without incident. Wounds  were closed using running 4-0 Vicryl subcuticular closure and Dermabond. The patient tolerated the procedure well and arrived at recovery room in stable condition.

## 2015-09-23 NOTE — Anesthesia Postprocedure Evaluation (Signed)
  Anesthesia Post-op Note  Patient: Elizabeth [name]  Procedure(s) Performed: Procedure(s): LAPAROSCOPIC ROUX-EN-Y GASTRIC (N/A) LAPAROSCOPIC CHOLECYSTECTOMY (N/A) LAPAROSCOPIC REPAIR OF HIATAL HERNIA (N/A)  Anesthesia type:General  Patient location: PACU  Post pain: Pain level controlled  Post assessment: Post-op Vital signs reviewed, Patient's Cardiovascular Status Stable, Respiratory Function Stable, Patent Airway and No signs of Nausea or vomiting  Post vital signs: Reviewed and stable  Last Vitals:  Filed Vitals:   09/23/15 1843  BP: 178/81  Pulse: 92  Temp: 36.7 C  Resp: 12    Level of consciousness: awake, alert  and patient cooperative  Complications: No apparent anesthesia complications

## 2015-09-24 ENCOUNTER — Encounter: Payer: Self-pay | Admitting: Specialist

## 2015-09-24 LAB — COMPREHENSIVE METABOLIC PANEL
ALBUMIN: 3.5 g/dL (ref 3.5–5.0)
ALK PHOS: 73 U/L (ref 38–126)
ALT: 27 U/L (ref 14–54)
ANION GAP: 8 (ref 5–15)
AST: 33 U/L (ref 15–41)
BUN: 15 mg/dL (ref 6–20)
CALCIUM: 9.1 mg/dL (ref 8.9–10.3)
CHLORIDE: 102 mmol/L (ref 101–111)
CO2: 25 mmol/L (ref 22–32)
Creatinine, Ser: 0.77 mg/dL (ref 0.44–1.00)
GFR calc Af Amer: >60 mL/min (ref >60)
GFR calc non Af Amer: >60 mL/min (ref >60)
GLUCOSE: 273 mg/dL — AB (ref 65–99)
Potassium: 4.3 mmol/L (ref 3.5–5.1)
SODIUM: 135 mmol/L (ref 135–145)
Total Bilirubin: 0.8 mg/dL (ref 0.3–1.2)
Total Protein: 6.8 g/dL (ref 6.5–8.1)

## 2015-09-24 LAB — CBC WITH DIFFERENTIAL/PLATELET
BASOS PCT: 0 %
Basophils Absolute: 0 10*3/uL (ref 0–0.1)
EOS ABS: 0 10*3/uL (ref 0–0.7)
EOS PCT: 0 %
HCT: 41.5 % (ref 35.0–47.0)
HEMOGLOBIN: 13.5 g/dL (ref 12.0–16.0)
Lymphocytes Relative: 7 %
Lymphs Abs: 0.6 10*3/uL — ABNORMAL LOW (ref 1.0–3.6)
MCH: 25.9 pg — ABNORMAL LOW (ref 26.0–34.0)
MCHC: 32.4 g/dL (ref 32.0–36.0)
MCV: 79.7 fL — ABNORMAL LOW (ref 80.0–100.0)
Monocytes Absolute: 0.3 10*3/uL (ref 0.2–0.9)
Monocytes Relative: 4 %
NEUTROS PCT: 89 %
Neutro Abs: 8.2 10*3/uL — ABNORMAL HIGH (ref 1.4–6.5)
PLATELETS: 225 10*3/uL (ref 150–440)
RBC: 5.2 MIL/uL (ref 3.80–5.20)
RDW: 15.4 % — ABNORMAL HIGH (ref 11.5–14.5)
WBC: 9.1 10*3/uL (ref 3.6–11.0)

## 2015-09-24 LAB — GLUCOSE, CAPILLARY
GLUCOSE-CAPILLARY: 146 mg/dL — AB (ref 65–99)
Glucose-Capillary: 209 mg/dL — ABNORMAL HIGH (ref 65–99)
Glucose-Capillary: 173 mg/dL — ABNORMAL HIGH (ref 65–99)
Glucose-Capillary: 187 mg/dL — ABNORMAL HIGH (ref 65–99)

## 2015-09-24 LAB — HEMOGLOBIN A1C: Hgb A1c MFr Bld: 8.6 % — ABNORMAL HIGH (ref 4.0–6.0)

## 2015-09-24 LAB — HEMOGLOBIN AND HEMATOCRIT, BLOOD
HEMATOCRIT: 35.3 % (ref 35.0–47.0)
Hemoglobin: 11.4 g/dL — ABNORMAL LOW (ref 12.0–16.0)

## 2015-09-24 MED ORDER — INSULIN ASPART 100 UNIT/ML ~~LOC~~ SOLN: 0.0000 [IU] | Freq: Three times a day (TID) | SUBCUTANEOUS | Status: DC

## 2015-09-24 MED ORDER — INSULIN ASPART 100 UNIT/ML ~~LOC~~ SOLN: 0.0000 [IU] | Freq: Every day | SUBCUTANEOUS | Status: DC

## 2015-09-24 MED ORDER — MUPIROCIN 2 % EX OINT
1.0000 "application " | TOPICAL_OINTMENT | Freq: Two times a day (BID) | CUTANEOUS | Status: DC
  Administered 2015-09-24 – 2015-09-25 (×3): 1 via NASAL
  Filled 2015-09-24: qty 22

## 2015-09-24 MED ORDER — SODIUM CHLORIDE 0.9 % IV BOLUS (SEPSIS)
1000.0000 mL | Freq: Once | INTRAVENOUS | Status: AC
  Administered 2015-09-24: 1000 mL via INTRAVENOUS

## 2015-09-24 MED ORDER — CHLORHEXIDINE GLUCONATE CLOTH 2 % EX PADS
6.0000 | MEDICATED_PAD | Freq: Every day | CUTANEOUS | Status: DC
  Administered 2015-09-24 – 2015-09-25 (×2): 6 via TOPICAL

## 2015-09-24 NOTE — Progress Notes (Signed)
INTERVENTION:  RD consulted for nutrition education regarding inpatient bariatric surgery.   RD provided "The Liquid Diet" handout from the Bariatric Surgery Guide from the [facility] [place]. This handout previously provided to patient prior to surgery is a duplicate copy. Discussed what foods/liquids are consistent with a Clear Liquid Diet and reinforced Key Concepts such as no carbonation, no caffeine, or sugar containing beverages. Provided methods to prevent dehydration and promote protein intake, using clock and sample fluid schedule. RD encouraged follow-up with outpatient dietitian after discharge.  Teach back method used.  Expect good compliance.  NUTRITION DIAGNOSIS:  Food and nutrition knowledge related deficit related to recent bariatric surgery as evidenced by dietitian consult for nutrition education   GOAL:  Patient will be able to sip and tolerate CL within 24-48 hours  MONITOR:  Energy intake Digestive system  ASSESSMENT:  Pt s/p roux-en-y and hiatal hernia and choleystectomy   Body mass index is 44.3 kg/(m^2).   Current diet order is bariatric clear liquids with unjury supplement TID, patient is consuming approximately 1 zo at this time.   Labs and medications reviewed.   LOW Care Level  Elizabeth [name] B. Zenia Resides, [place], [place] (pager)

## 2015-09-24 NOTE — Discharge Instructions (Signed)
Discharging on Novolog insulin injection after checking blood sugar level 3 times a day, keep a record of all your blood sugar readings and how much insulin you took, and take this records to your PMD in 1-2 weeks to help adjust insulin doses.  Also decreased blood pressure meds, so check blood pressure twice daily and keep a record.

## 2015-09-24 NOTE — Progress Notes (Signed)
[place] [facility] [place] NAME: Elizabeth [name]    MR#:  [id number]  DATE OF BIRTH:  [birth year]  SUBJECTIVE:  CHIEF COMPLAINT:  No chief complaint on file.  S/p gastric sx- post op day 1, consult for DM and Htn management.  on liquid diet, minimal pain, likely discharge later today per surgical team.   REVIEW OF SYSTEMS:  CONSTITUTIONAL: No fever, fatigue or weakness.  EYES: No blurred or double vision.  EARS, NOSE, AND THROAT: No tinnitus or ear pain.  RESPIRATORY: No cough, shortness of breath, wheezing or hemoptysis.  CARDIOVASCULAR: No chest pain, orthopnea, edema.  GASTROINTESTINAL: No nausea, vomiting, diarrhea , mild abdominal pain.  GENITOURINARY: No dysuria, hematuria.  ENDOCRINE: No polyuria, nocturia,  HEMATOLOGY: No anemia, easy bruising or bleeding SKIN: No rash or lesion. MUSCULOSKELETAL: No joint pain or arthritis.   NEUROLOGIC: No tingling, numbness, weakness.  PSYCHIATRY: No anxiety or depression.   ROS  DRUG ALLERGIES:  No Known Allergies  VITALS:  Blood pressure 124/53, pulse 92, temperature 97.9 F (36.6 C), temperature source Oral, resp. rate 20, height 5\' 3"  (1.6 m), weight 113.399 kg (250 lb), SpO2 94 %.  PHYSICAL EXAMINATION:  GENERAL:  64 y.o.-year-old patient lying in the bed with no acute distress.  EYES: Pupils equal, round, reactive to light and accommodation. No scleral icterus. Extraocular muscles intact.  HEENT: Head atraumatic, normocephalic. Oropharynx and nasopharynx clear.  NECK:  Supple, no jugular venous distention. No thyroid enlargement, no tenderness.  LUNGS: Normal breath sounds bilaterally, no wheezing, rales,rhonchi or crepitation. No use of accessory muscles of respiration.  CARDIOVASCULAR: S1, S2 normal. No murmurs, rubs, or gallops.  ABDOMEN: Soft,mild tender, nondistended. Bowel sounds present. No organomegaly or mass. Surgical stiches and dressing present. EXTREMITIES: No pedal  edema, cyanosis, or clubbing.  NEUROLOGIC: Cranial nerves II through XII are intact. Muscle strength 5/5 in all extremities. Sensation intact. Gait not checked.  PSYCHIATRIC: The patient is alert and oriented x 3.  SKIN: No obvious rash, lesion, or ulcer.   Physical Exam LABORATORY PANEL:   CBC  Recent Labs Lab 09/24/15 0428  WBC 9.1  HGB 13.5  HCT 41.5  PLT 225   ------------------------------------------------------------------------------------------------------------------  Chemistries   Recent Labs Lab 09/23/15 1919  CREATININE 0.66   ------------------------------------------------------------------------------------------------------------------  Cardiac Enzymes No results for input(s): TROPONINI in the last 168 hours. ------------------------------------------------------------------------------------------------------------------  RADIOLOGY:  No results found.  ASSESSMENT AND PLAN:   Active Problems:   Morbid obesity ([place])  * Diabetes mellitus   Was on high dose of insulin before surgery, now may not need at all, or very less.   Suggest to discharge on novolog sliding scale, and will give prescription to her pharmacy for that.   Advised to check Blood sugar 3 times daily, and keep a record of that, take to PMD in 1-2 weeks to readjust Insulin dose.   Stopped all other diabetic meds.  * Hypertension   Will only give atenolol, stop lisinopril - HCTZ now,   Advised to check blood pressure twice daily and keep record, and discuss with PMD to readjust meds in 1-2 weeks.    * Chronic peripheral vascular disease Resume home medications on's patient starts tolerating by mouth   * Status post bariatric surgery postop day #0 Management per surgery  All the records are reviewed and case discussed with Care Management/Social Workerr. Management plans discussed with the patient, family and they are in agreement.  CODE STATUS: FUll. TOTAL TIME  TAKING CARE OF  THIS PATIENT: 35 minutes.   Please call, if any change in plan or condition.   POSSIBLE D/C IN 1 DAYS, DEPENDING ON CLINICAL CONDITION.   Vaughan Basta M.D on 09/24/2015   Between 7am to 6pm - Pager - [phone]  After 6pm go to [email or web address] - password [email or web address] [facility]  Office  [phone]  CC: Primary care physician; Marguerita Merles, MD  Note: This dictation was prepared with Dragon dictation along with smaller phrase technology. Any transcriptional errors that result from this process are unintentional.

## 2015-09-24 NOTE — Progress Notes (Signed)
Patient has been unable to void and states she has no urge to void. Completed bladder scanner and only 61mLs of urine were in bladder. Called MD and received order to administer 1L of NS over 2 hours and to in and out catheterize as needed x 1.

## 2015-09-24 NOTE — Progress Notes (Signed)
Inpatient Diabetes Program Recommendations  AACE/ADA: New Consensus Statement on Inpatient Glycemic Control (2015)  Target Ranges:  Prepandial:   less than 140 mg/dL      Peak postprandial:   less than 180 mg/dL (1-2 hours)      Critically ill patients:  140 - 180 mg/dL   Review of Glycemic Control:  Results for Elizabeth [name], Elizabeth [name] (MRN [id number]) as of 09/24/2015 09:36  Ref. Range 09/23/2015 12:02 09/23/2015 15:52 09/23/2015 21:29 09/24/2015 07:38  Glucose-Capillary Latest Ref Range: 65-99 mg/dL 162 (H) 201 (H) 270 (H) 209 (H)   Diabetes history: Type 2 diabetes- Outpatient Diabetes medications: 70/30 55 units breakfast and 40 units with supper, Tradjenta 5 mg  Daily, Metformin 500 mg bid Current orders for Inpatient glycemic control:  Novolog moderate tid with meals and HS, Patient also received Lantus 10 units q HS last night. (note that patient also received Decadron 10 mg x 1 yesterday) Inpatient Diabetes Program Recommendations:    Note patient will not need as much insulin (if any at all) post gastric-bypass.  Consider d/c on Novolog moderate correction three times a day with close monitoring.  Will discuss with patient.  Thanks Adah Perl, RN, BC-ADM Inpatient Diabetes Coordinator Pager [phone] (8a-5p)

## 2015-09-24 NOTE — Progress Notes (Addendum)
Spoke with patient about diabetes and home regimen for diabetes control. Patient reports that she is followed by her PCP for diabetes management and she was taking 70/30 55 units QAM, 70/30 40 units QPM, Tradjenta 5 mg daily, and Metformin 500 mg BID as an outpatient for diabetes control prior to surgery. Discuss how glycemic control changes after having Roux-En-Y Gastric surgery. Explained that insulin needs will change greatly and patient may little to no insulin depending on how glucose trends. Patient states that she has a glucometer and testing supplies at home to monitor glucose.  Explained discharge plan is to discharge on Novolog correction scale and patient will monitor glucose at least 3-4 times per day and keep a log of glucose readings and insulin taken. Patient states that she has used a correction scale in the past and is familiar with using a correction scale to determine amount of insulin to take. Encouraged patient to contact her doctor if her glucose is running consistently over 200 mg/dl or less than 70 mg/dl for further advice for glycemic control. Otherwise she will follow up with her PCP regarding glycemic control as scheduled.  Patient verbalized understanding of information discussed and she states that she has no further questions at this time related to diabetes.   Thanks, Barnie Alderman, RN, MSN, CDE Diabetes Coordinator Inpatient Diabetes Program [phone] (Team Pager) [phone] ([facility] office) [phone] [facility] office) [phone] [facility] office)

## 2015-09-24 NOTE — Progress Notes (Signed)
Moving slowly. Starting po intake. Pain controlled. Lungs clear with shallow inspiration. abd flat/soft.  Plan foley out and monitor po intake with possible d/c later today

## 2015-09-24 NOTE — Progress Notes (Signed)
Removed foley. Patient due to void by 1530.

## 2015-09-24 NOTE — Progress Notes (Signed)
Ambulated with patient in hallway. She was able to complete a lap around the nurses station with no SOB, dyspnea, pain, or distress. She states her pain is currently a 0/10.

## 2015-09-24 NOTE — Progress Notes (Signed)
Ambulated with patient in hallway. She was able to complete a lap around the desk, with no discomfort.

## 2015-09-24 NOTE — Care Management Important Message (Signed)
Important Message  Patient Details  Name: XION MICHALAK MRN: [id number] Date of Birth: 12-02-1950   Medicare Important Message Given:  Yes    [name] Heidelberg, RN 09/24/2015, 10:41 AM

## 2015-09-25 LAB — GLUCOSE, CAPILLARY
GLUCOSE-CAPILLARY: 144 mg/dL — AB (ref 65–99)
Glucose-Capillary: 163 mg/dL — ABNORMAL HIGH (ref 65–99)

## 2015-09-25 LAB — CBC WITH DIFFERENTIAL/PLATELET
BASOS ABS: 0 10*3/uL (ref 0–0.1)
Basophils Relative: 0 %
EOS ABS: 0.1 10*3/uL (ref 0–0.7)
EOS PCT: 1 %
HCT: 36 % (ref 35.0–47.0)
Hemoglobin: 11.8 g/dL — ABNORMAL LOW (ref 12.0–16.0)
LYMPHS ABS: 1.8 10*3/uL (ref 1.0–3.6)
LYMPHS PCT: 19 %
MCH: 26.6 pg (ref 26.0–34.0)
MCHC: 32.9 g/dL (ref 32.0–36.0)
MCV: 80.9 fL (ref 80.0–100.0)
MONO ABS: 0.7 10*3/uL (ref 0.2–0.9)
Monocytes Relative: 8 %
Neutro Abs: 6.9 10*3/uL — ABNORMAL HIGH (ref 1.4–6.5)
Neutrophils Relative %: 72 %
PLATELETS: 198 10*3/uL (ref 150–440)
RBC: 4.46 MIL/uL (ref 3.80–5.20)
RDW: 15.7 % — AB (ref 11.5–14.5)
WBC: 9.5 10*3/uL (ref 3.6–11.0)

## 2015-09-25 LAB — SURGICAL PATHOLOGY

## 2015-09-25 MED ORDER — SODIUM CHLORIDE 0.9 % IV SOLN
INTRAVENOUS | Status: DC
  Administered 2015-09-25: 15:00:00 via INTRAVENOUS

## 2015-09-25 MED ORDER — AMLODIPINE BESYLATE 5 MG PO TABS: 5.0000 mg | ORAL_TABLET | Freq: Every day | ORAL | Status: DC

## 2015-09-25 NOTE — Discharge Summary (Signed)
Physician Discharge Summary  Patient ID: Elizabeth [name] MRN: [id number] DOB/AGE: July 14, 1951 64 y.o.  Admit date: 09/23/2015 Discharge date: 09/25/2015  Admission Diagnoses: Morbid Obesity  Discharge Diagnoses:  Active Problems:   Morbid obesity ([place])   Discharged Condition: good  Hospital Course: Pt underwent laparoscopic RYGBP with HHR and Lap chole. When we had adequate oral intake, pain and nausea were managed with oral medications and she was capable of ambulation and voiding without difficulty she was discharged home.  Consults: None  Significant Diagnostic Studies: labs: see chart  Treatments: IV hydration, antibiotics: Mefoxin, analgesia: acetaminophen, Dilaudid and hycet and surgery: lap RYGBP, HHR, Chole  Discharge Exam: Blood pressure 109/40, pulse 65, temperature 98.2 F (36.8 C), temperature source Oral, resp. rate 18, height 5\' 3"  (1.6 m), weight 113.399 kg (250 lb), SpO2 94 %. General appearance: alert, cooperative and no distress GI: soft, non-tender; bowel sounds normal; no masses,  no organomegaly Extremities: extremities normal, atraumatic, no cyanosis or edema  Disposition: Final discharge disposition not confirmed  Discharge Instructions    Call MD for:  difficulty breathing, headache or visual disturbances    Complete by:  As directed      Call MD for:  extreme fatigue    Complete by:  As directed      Call MD for:  hives    Complete by:  As directed      Call MD for:  persistant dizziness or light-headedness    Complete by:  As directed      Call MD for:  persistant nausea and vomiting    Complete by:  As directed      Call MD for:  redness, tenderness, or signs of infection (pain, swelling, redness, odor or green/yellow discharge around incision site)    Complete by:  As directed      Call MD for:  severe uncontrolled pain    Complete by:  As directed      Call MD for:  temperature >100.4    Complete by:  As directed      Discharge  instructions    Complete by:  As directed   Remember to start your chewable or liquid B complex once you arrive home and take this daily for 30 days. You may continue to take this beyond 30 days if you choose. Stick with a Clear liquid diet for 2 days post operatively then you may advance to a Full Liquid diet for 12 days, which means you will be on a strict liquid diet for 14 days. Your daily goal is going to be to get in 60-80g of protein and 64oz of fluid.     Driving Restrictions    Complete by:  As directed   You may not drive until 24 hours past your last dose of pain medications.     Increase activity slowly    Complete by:  As directed      Lifting restrictions    Complete by:  As directed   Do not lift more than 10-15 pounds for 4-6 weeks post operatively     No dressing needed    Complete by:  As directed      No wound care    Complete by:  As directed      Other Restrictions    Complete by:  As directed   Avoid using your core muscles for 30 days after surgery - motions like pushing, pulling, climbing etc. Make sure to get up and walk frequently  every day to help avoid developing blood clots.            Medication List    STOP taking these medications        CALAMINE-ZINC OXIDE EX     furosemide 20 MG tablet  Commonly known as:  LASIX     insulin NPH-regular Human (70-30) 100 UNIT/ML injection  Commonly known as:  NOVOLIN 70/30     linagliptin 5 MG Tabs tablet  Commonly known as:  TRADJENTA     lisinopril 20 MG tablet  Commonly known as:  PRINIVIL,ZESTRIL     lisinopril-hydrochlorothiazide 20-25 MG tablet  Commonly known as:  PRINZIDE,ZESTORETIC     metFORMIN 500 MG tablet  Commonly known as:  GLUCOPHAGE     ondansetron 4 MG tablet  Commonly known as:  ZOFRAN      TAKE these medications        albuterol 108 (90 BASE) MCG/ACT inhaler  Commonly known as:  PROVENTIL HFA;VENTOLIN HFA  Inhale 2 puffs into the lungs every 6 (six) hours as needed for wheezing  or shortness of breath.     aspirin 81 MG tablet  Take 81 mg by mouth daily. In am     atenolol 50 MG tablet  Commonly known as:  TENORMIN  Take 50 mg by mouth daily. In am     brimonidine 0.2 % ophthalmic solution  Commonly known as:  ALPHAGAN  Place 1 drop into the left eye 2 (two) times daily.     CALTRATE 600 PLUS-VIT D PO  Take 2 tablets by mouth every morning.     enoxaparin 40 MG/0.4ML injection  Commonly known as:  LOVENOX  Inject 0.4 mLs (40 mg total) into the skin daily.     gabapentin 300 MG capsule  Commonly known as:  NEURONTIN  Take 300 mg by mouth 3 (three) times daily.     HYDROcodone-acetaminophen 7.5-325 mg/15 ml solution  Commonly known as:  HYCET  Take 10 mLs by mouth 4 (four) times daily as needed for moderate pain.     insulin aspart 100 UNIT/ML injection  Commonly known as:  novoLOG  Inject 0-15 Units into the skin 3 (three) times daily with meals. CBG< 70- have some drink with sugar and re-check in 1 hour and call your doctor. CBG 70 - 120: 0 units CBG 121 - 150: 2 units CBG 151 - 200: 3 units CBG 201 - 250: 5 units CBG 251 - 300: 8 units CBG 301 - 350: 11 units CBG 351 - 400: 15 units CBG > 400: call MD     insulin aspart 100 UNIT/ML injection  Commonly known as:  novoLOG  Inject 0-5 Units into the skin at bedtime. CBG< 70- take a liquids with sugar like juice, and call MD immediately. CBG 70 - 200 : 0 units CBG 201 - 250: 2 units CBG 251 - 300: 3 units CBG 301 - 350: 4 units CBG 351 - 400: 5 units CBG > 400: call MD     letrozole 2.5 MG tablet  Commonly known as:  FEMARA  TAKE ONE TABLET BY MOUTH ONCE DAILY     ondansetron 4 MG disintegrating tablet  Commonly known as:  ZOFRAN ODT  Take 1 tablet (4 mg total) by mouth every 4 (four) hours as needed for nausea or vomiting.     pravastatin 40 MG tablet  Commonly known as:  PRAVACHOL  Take 40 mg by mouth daily. In afternoon  ranitidine 150 MG tablet  Commonly known as:  ZANTAC  Take 150 mg by  mouth 2 (two) times daily.     sulfamethoxazole-trimethoprim 800-160 MG tablet  Commonly known as:  BACTRIM DS,SEPTRA DS  Take 1 tablet by mouth 2 (two) times daily.     timolol 0.5 % ophthalmic solution  Commonly known as:  BETIMOL  Place 1 drop into the left eye 2 (two) times daily.         Signed: Mardelle Matte 09/25/2015, 4:08 PM

## 2015-09-25 NOTE — Progress Notes (Signed)
[place] [facility] [place] NAME: Elizabeth [name]    MR#:  [id number]  DATE OF BIRTH:  April 30, 1951  SUBJECTIVE:  CHIEF COMPLAINT:  No chief complaint on file.  -Medical management after gastric bypass surgery. Patient with history of diabetes and hypertension -Sugars have been less than 200. Patient remains on liquid diet -Blood pressure is well controlled as well. Patient denies any complaints and hoping to go home today. Voiding well.  REVIEW OF SYSTEMS:  Review of Systems  Constitutional: Negative for fever and chills.  HENT: Negative for ear discharge and ear pain.   Eyes: Negative for blurred vision and double vision.  Respiratory: Negative for cough, shortness of breath and wheezing.   Cardiovascular: Negative for chest pain, palpitations and leg swelling.  Gastrointestinal: Negative for nausea, vomiting, abdominal pain, diarrhea and constipation.  Genitourinary: Negative for dysuria, frequency and hematuria.  Musculoskeletal: Negative for myalgias.  Neurological: Negative for dizziness, sensory change, speech change, focal weakness, seizures, weakness and headaches.  Psychiatric/Behavioral: Negative for depression.    DRUG ALLERGIES:  No Known Allergies  VITALS:  Blood pressure 109/40, pulse 65, temperature 98.2 F (36.8 C), temperature source Oral, resp. rate 18, height 5\' 3"  (1.6 m), weight 113.399 kg (250 lb), SpO2 94 %.  PHYSICAL EXAMINATION:  Physical Exam  GENERAL:  64 y.o.-year-old obese patient lying in the bed with no acute distress.  EYES: Pupils equal, round, reactive to light and accommodation. No scleral icterus. Extraocular muscles intact.  HEENT: Head atraumatic, normocephalic. Oropharynx and nasopharynx clear.  NECK:  Supple, no jugular venous distention. No thyroid enlargement, no tenderness.  LUNGS: Normal breath sounds bilaterally, no wheezing, rales,rhonchi or crepitation. No use of accessory muscles of  respiration.  CARDIOVASCULAR: S1, S2 normal. No murmurs, rubs, or gallops.  ABDOMEN: Soft, nontender, nondistended. Laparoscopic incisions noted, healing well. Bowel sounds present. No organomegaly or mass.  EXTREMITIES: No pedal edema, cyanosis, or clubbing.  NEUROLOGIC: Cranial nerves II through XII are intact. Muscle strength 5/5 in all extremities. Sensation intact. Gait not checked.  PSYCHIATRIC: The patient is alert and oriented x 3.  SKIN: No obvious rash, lesion, or ulcer.    LABORATORY PANEL:   CBC  Recent Labs Lab 09/25/15 0325  WBC 9.5  HGB 11.8*  HCT 36.0  PLT 198   ------------------------------------------------------------------------------------------------------------------  Chemistries   Recent Labs Lab 09/24/15 0428  NA 135  K 4.3  CL 102  CO2 25  GLUCOSE 273*  BUN 15  CREATININE 0.77  CALCIUM 9.1  AST 33  ALT 27  ALKPHOS 73  BILITOT 0.8   ------------------------------------------------------------------------------------------------------------------  Cardiac Enzymes No results for input(s): TROPONINI in the last 168 hours. ------------------------------------------------------------------------------------------------------------------  RADIOLOGY:  No results found.  EKG:   Orders placed or performed during the hospital encounter of 09/09/15  . EKG 12-Lead  . EKG 12-Lead    ASSESSMENT AND PLAN:   64 year old female with past medical history significant for insulin-dependent diabetes mellitus, hypertension, obesity and peripheral vascular disease admitted for gastric bypass surgery.  #1 diabetes mellitus-patient has gastric bypass surgery done and sugars have been less than 200/24 hours. Likely she can come off her insulin,  - might need sliding scale insulin - to be discussed by PCP - check finger sticks at home as well -on bariatric diet -Tradjenta can be discontinued as well and patient can continue to take her metformin for  now  #2 HTN- well controlled - on atenolol and norvasc- may be norvasc  dose can be reduced  #3 GERD-on Protonix  Further management per her bariatric surgeon. Patient is medically stable for discharge. Please adjust her antihypertensives and diabetic medications as per the note above. Please call if any questions. Will sign off.   All the records are reviewed and case discussed with Care Management/Social Workerr. Management plans discussed with the patient, family and they are in agreement.  CODE STATUS: Full code  TOTAL TIME TAKING CARE OF THIS PATIENT: 37 minutes.   POSSIBLE D/C TODAY, DEPENDING ON CLINICAL CONDITION.   Nikisha Fleece M.D on 09/25/2015 at 1:30 PM  Between 7am to 6pm - Pager - [phone]  After 6pm go to [email or web address] - password [email or web address]  Office  [phone]  CC: Primary care physician; Marguerita Merles, MD

## 2015-09-25 NOTE — Progress Notes (Signed)
Pt d/c to home today.  IV removed intact.  Rx's given to pt w/all questions and concerns addressed.  D/C paperwork reviewed and education provided with all questions and concerns addressed.  Pt daughter at bedside for home transport.  

## 2015-11-17 ENCOUNTER — Other Ambulatory Visit: Payer: Self-pay | Admitting: Oncology

## 2015-11-18 ENCOUNTER — Telehealth: Payer: Self-pay | Admitting: *Deleted

## 2015-11-18 NOTE — Telephone Encounter (Signed)
Already done

## 2015-11-24 ENCOUNTER — Encounter: Payer: Self-pay | Admitting: Oncology

## 2015-11-24 ENCOUNTER — Inpatient Hospital Stay: Payer: Medicare HMO

## 2015-11-24 ENCOUNTER — Inpatient Hospital Stay: Payer: Medicare HMO | Attending: Oncology | Admitting: Oncology

## 2015-11-24 VITALS — BP 178/78 | HR 72 | Temp 96.3°F | Resp 18 | Wt 234.1 lb

## 2015-11-24 DIAGNOSIS — I10 Essential (primary) hypertension: Secondary | ICD-10-CM | POA: Diagnosis not present

## 2015-11-24 DIAGNOSIS — M7989 Other specified soft tissue disorders: Secondary | ICD-10-CM | POA: Insufficient documentation

## 2015-11-24 DIAGNOSIS — Z803 Family history of malignant neoplasm of breast: Secondary | ICD-10-CM | POA: Diagnosis not present

## 2015-11-24 DIAGNOSIS — L409 Psoriasis, unspecified: Secondary | ICD-10-CM | POA: Diagnosis not present

## 2015-11-24 DIAGNOSIS — C50919 Malignant neoplasm of unspecified site of unspecified female breast: Secondary | ICD-10-CM

## 2015-11-24 DIAGNOSIS — Z8673 Personal history of transient ischemic attack (TIA), and cerebral infarction without residual deficits: Secondary | ICD-10-CM | POA: Insufficient documentation

## 2015-11-24 DIAGNOSIS — E119 Type 2 diabetes mellitus without complications: Secondary | ICD-10-CM | POA: Diagnosis not present

## 2015-11-24 DIAGNOSIS — Z923 Personal history of irradiation: Secondary | ICD-10-CM | POA: Insufficient documentation

## 2015-11-24 DIAGNOSIS — C50911 Malignant neoplasm of unspecified site of right female breast: Secondary | ICD-10-CM | POA: Diagnosis not present

## 2015-11-24 DIAGNOSIS — Z7982 Long term (current) use of aspirin: Secondary | ICD-10-CM | POA: Diagnosis not present

## 2015-11-24 DIAGNOSIS — Z170 Estrogen receptor positive status [ER+]: Secondary | ICD-10-CM | POA: Diagnosis not present

## 2015-11-24 DIAGNOSIS — Z79899 Other long term (current) drug therapy: Secondary | ICD-10-CM | POA: Insufficient documentation

## 2015-11-24 DIAGNOSIS — Z794 Long term (current) use of insulin: Secondary | ICD-10-CM | POA: Diagnosis not present

## 2015-11-24 LAB — CBC WITH DIFFERENTIAL/PLATELET
Basophils Absolute: 0.1 10*3/uL (ref 0–0.1)
Basophils Relative: 1 %
Eosinophils Absolute: 0.4 10*3/uL (ref 0–0.7)
Eosinophils Relative: 4 %
HCT: 40.7 % (ref 35.0–47.0)
HEMOGLOBIN: 13.5 g/dL (ref 12.0–16.0)
LYMPHS ABS: 1.7 10*3/uL (ref 1.0–3.6)
LYMPHS PCT: 17 %
MCH: 27.1 pg (ref 26.0–34.0)
MCHC: 33.2 g/dL (ref 32.0–36.0)
MCV: 81.4 fL (ref 80.0–100.0)
Monocytes Absolute: 0.6 10*3/uL (ref 0.2–0.9)
Monocytes Relative: 6 %
NEUTROS ABS: 7.6 10*3/uL — AB (ref 1.4–6.5)
NEUTROS PCT: 72 %
Platelets: 230 10*3/uL (ref 150–440)
RBC: 4.99 MIL/uL (ref 3.80–5.20)
RDW: 16.9 % — ABNORMAL HIGH (ref 11.5–14.5)
WBC: 10.3 10*3/uL (ref 3.6–11.0)

## 2015-11-24 LAB — COMPREHENSIVE METABOLIC PANEL
ALK PHOS: 97 U/L (ref 38–126)
ALT: 20 U/L (ref 14–54)
AST: 22 U/L (ref 15–41)
Albumin: 4 g/dL (ref 3.5–5.0)
Anion gap: 8 (ref 5–15)
BUN: 22 mg/dL — AB (ref 6–20)
CALCIUM: 9.3 mg/dL (ref 8.9–10.3)
CO2: 31 mmol/L (ref 22–32)
CREATININE: 0.8 mg/dL (ref 0.44–1.00)
Chloride: 97 mmol/L — ABNORMAL LOW (ref 101–111)
Glucose, Bld: 164 mg/dL — ABNORMAL HIGH (ref 65–99)
Potassium: 3.6 mmol/L (ref 3.5–5.1)
Sodium: 136 mmol/L (ref 135–145)
Total Bilirubin: 0.8 mg/dL (ref 0.3–1.2)
Total Protein: 7.2 g/dL (ref 6.5–8.1)

## 2015-11-24 NOTE — Addendum Note (Signed)
Addended by: Livia Snellen on: 11/24/2015 11:01 AM   Modules accepted: Orders

## 2015-11-24 NOTE — Progress Notes (Signed)
[place] @ [facility] [phone]  Fax:([phone] [place]: Jun 16, 1951  MR#: [id number]  OPF#:[id number]  Patient Care Team: Marguerita Merles, MD as PCP - General (Family Medicine) Christin Fudge, MD as Consulting Physician (Surgery)  CHIEF COMPLAINT:  Chief Complaint  Patient presents with  . 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  HER-2 receptor negative.  Diagnosis in June of 2015  2.status post partial lumpectomy on May 02, 2014 pT1b  pN0 M0  stage 1B 3.starting radiation to the breast followed by letrozole vitamin D and calciumfor 5 years     Carcinoma of right breast [facility])     INTERVAL HISTORY:  65 year old lady with history of carcinoma of breast.  Had a recent mammogram as well as bone density study done.  Patient has noticed some swelling of lower extremity.  Taking for  etrozole calcium and vitamin D.  No bony pains.  No bony fractures. And was in emergency room at [facility] for episodes of numbness and slurred speech lasting only for a few minutes.  Patient had previous history of transient ischemic attack.  Patient had MRI and MRA and no evidence of acute cerebrovascular accident was found was started on Plavix.  She did not have any further episode of TIA No bony pain no bony fracture.  Patient is taking letrozole vitamin D calcium no bony pain no bony fracture.  REVIEW OF SYSTEMS:    GENERAL:  Feels good.  Active.  No fevers, sweats or weight loss. PERFORMANCE STATUS (ECOG)01 HEENT:  No visual changes, runny nose, sore throat, mouth sores or tenderness. Lungs: No shortness of breath or cough.  No hemoptysis. Cardiac:  No chest pain, palpitations, orthopnea, or PND. GI:  No nausea, vomiting, diarrhea, constipation, melena or hematochezia. GU:  No urgency, frequency, dysuria, or  hematuria. Musculoskeletal:  No back pain.  No joint pain.  No muscle tenderness. Extremities:  Swelling of both lower extremityUnchanged.  Intermittent redness of both lower extremity being managed by primary care physician  Skin:  No rashes or skin changes. Neuro:  No headache, numbness or weakness, balance or coordination issues. Endocrine:  No diabetes, thyroid issues, hot flashes or night sweats. Psych:  No mood changes, depression or anxiety. Pain:  No focal pain. Review of systems:  All other systems reviewed and found to be negative.  As per HPI. Otherwise, a complete review of systems is negatve.  PAST MEDICAL HISTORY: Past Medical History  Diagnosis Date  . Varicose veins   . Diabetes mellitus without complication ([place])   . Lymphedema   . PONV (postoperative nausea and vomiting)   . Stroke ([place]) 2000    residual left arm weakness  . Cancer [facility])     breast  . Carcinoma of right breast ([place]) 05/20/2014  . Psoriasis 1990  . Hypertension     PAST SURGICAL HISTORY: Past Surgical History  Procedure Laterality Date  . Breast reduction surgery  1977  . Breast excisional biopsy Right 2015    positive, with radiation  . Breast lumpectomy Right 04-2014    followed by radiation,  no chemo  . Eye surgery Left 2015    cataracts and left eye detached retina repair  . Eye surgery Right 2013    cataract with len implant  . Dilation and curettage of uterus  2010  . 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 Cholecystectomy N/A 09/23/2015    Procedure: LAPAROSCOPIC CHOLECYSTECTOMY;  Surgeon: Bonner Puna, MD;  Location: [facility] ORS;  Service: General;  Laterality: N/A;  . Hiatal hernia repair N/A 09/23/2015    Procedure: LAPAROSCOPIC REPAIR OF HIATAL HERNIA;  Surgeon: Bonner Puna, MD;  Location: [facility] ORS;  Service: General;  Laterality: N/A;    FAMILY  HISTORY Family History  Problem Relation Age of Onset  . Diabetes Mother   . Cancer Mother   . Breast cancer Mother 52  . Diabetes Father   . Diabetes Sister   . Diabetes Brother     ADVANCED DIRECTIVES:  Patient does not have any living will or healthcare power of attorney.  Information was given .  Available resources had been discussed.  We will follow-up on subsequent appointments regarding this issue HEALTH MAINTENANCE: Social History  Substance Use Topics  . Smoking status: Never Smoker   . Smokeless tobacco: None  . Alcohol Use: No      No Known Allergies  Current Outpatient Prescriptions  Medication Sig Dispense Refill  . albuterol (PROVENTIL HFA;VENTOLIN HFA) 108 (90 BASE) MCG/ACT inhaler Inhale 2 puffs into the lungs every 6 (six) hours as needed for wheezing or shortness of breath.    [name] aspirin 81 MG tablet Take 81 mg by mouth daily. In am    . atenolol (TENORMIN) 50 MG tablet Take 50 mg by mouth daily. In am    . brimonidine (ALPHAGAN) 0.2 % ophthalmic solution Place 1 drop into the left eye 2 (two) times daily.    . Calcium-Vitamin D (CALTRATE 600 PLUS-VIT D PO) Take 2 tablets by mouth every morning.     . ciprofloxacin (CIPRO) 500 MG tablet     . clopidogrel (PLAVIX) 75 MG tablet Take 75 mg by mouth.    . gabapentin (NEURONTIN) 300 MG capsule Take 300 mg by mouth 3 (three) times daily.    [name] HYDROcodone-acetaminophen (HYCET) 7.5-325 mg/15 ml solution Take 10 mLs by mouth 4 (four) times daily as needed for moderate pain. 473 mL 0  . insulin lispro (HUMALOG) 100 UNIT/ML injection Inject into the skin 3 (three) times daily before meals.    [name] letrozole (FEMARA) 2.5 MG tablet TAKE ONE TABLET BY MOUTH ONCE DAILY 30 tablet 0  . ondansetron (ZOFRAN ODT) 4 MG disintegrating tablet Take 1 tablet (4 mg total) by mouth every 4 (four) hours as needed for nausea or vomiting. 20 tablet 0  . pravastatin (PRAVACHOL) 40 MG tablet Take 40 mg by mouth daily. In afternoon    .  ranitidine (ZANTAC) 150 MG tablet Take 150 mg by mouth 2 (two) times daily.    [name] sulfamethoxazole-trimethoprim (BACTRIM DS,SEPTRA DS) 800-160 MG tablet Take 1 tablet by mouth 2 (two) times daily.    . timolol (BETIMOL) 0.5 % ophthalmic solution Place 1 drop into the left eye 2 (two) times daily.     No current facility-administered medications for this visit.    OBJECTIVE:  Filed Vitals:   11/24/15 1027  BP: 178/78  Pulse: 72  Temp: 96.3 F (35.7 C)  Resp: 18     Body mass index is 41.48 kg/(m^2).    ECOG FS:1 - Symptomatic but completely ambulatory  PHYSICAL EXAM: GENERAL:  Well developed, well nourished, sitting comfortably in the exam room  in no acute distress. MODERATELY OBESE  MENTAL STATUS:  Alert and oriented to person, place and time. HEADNormocephalic, atraumatic, face symmetric, no Cushingoid features. EYES:.  Pupils equal round and reactive to light and accomodation.  No conjunctivitis or scleral icterus.   RESPIRATORY:  Clear to auscultation without rales, wheezes or rhonchi. CARDIOVASCULAR:  Regular rate and rhythm without murmur, rub or gallop. BREAST:  Right breast without masses, status post lumpectomy and axillary lymph node evaluation on the right side radiation changes skin changes or nipple discharge.  Left breast without masses, skin changes or nipple discharge. ABDOMEN:  Soft, non-tender, with active bowel sounds, and no hepatosplenomegaly.  No masses. BACK:  No CVA tenderness.  No tenderness on percussion of the back or rib cage. SKIN:  No rashes, ulcers or lesions. EXTREMITIES: Swelling 2+ with mild cellulitis LYMPH NODES: No palpable cervical, supraclavicular, axillary or inguinal adenopathy  NEUROLOGICAL: Unremarkable. PSYCH:  Appropriate.  LAB RESULTS:  Appointment on 11/24/2015  Component Date Value Ref Range Status  . WBC 11/24/2015 10.3  3.6 - 11.0 K/uL Final  . RBC 11/24/2015 4.99  3.80 - 5.20 MIL/uL Final  . Hemoglobin 11/24/2015 13.5  12.0 -  16.0 g/dL Final  . HCT 11/24/2015 40.7  35.0 - 47.0 % Final  . MCV 11/24/2015 81.4  80.0 - 100.0 fL Final  . MCH 11/24/2015 27.1  26.0 - 34.0 pg Final  . MCHC 11/24/2015 33.2  32.0 - 36.0 g/dL Final  . RDW 11/24/2015 16.9* 11.5 - 14.5 % Final  . Platelets 11/24/2015 230  150 - 440 K/uL Final  . Neutrophils Relative % 11/24/2015 72   Final  . Neutro Abs 11/24/2015 7.6* 1.4 - 6.5 K/uL Final  . Lymphocytes Relative 11/24/2015 17   Final  . Lymphs Abs 11/24/2015 1.7  1.0 - 3.6 K/uL Final  . Monocytes Relative 11/24/2015 6   Final  . Monocytes Absolute 11/24/2015 0.6  0.2 - 0.9 K/uL Final  . Eosinophils Relative 11/24/2015 4   Final  . Eosinophils Absolute 11/24/2015 0.4  0 - 0.7 K/uL Final  . Basophils Relative 11/24/2015 1   Final  . Basophils Absolute 11/24/2015 0.1  0 - 0.1 K/uL Final  . Sodium 11/24/2015 136  135 - 145 mmol/L Final  . Potassium 11/24/2015 3.6  3.5 - 5.1 mmol/L Final  . Chloride 11/24/2015 97* 101 - 111 mmol/L Final  . CO2 11/24/2015 31  22 - 32 mmol/L Final  . Glucose, Bld 11/24/2015 164* 65 - 99 mg/dL Final  . BUN 11/24/2015 22* 6 - 20 mg/dL Final  . Creatinine, Ser 11/24/2015 0.80  0.44 - 1.00 mg/dL Final  . Calcium 11/24/2015 9.3  8.9 - 10.3 mg/dL Final  . Total Protein 11/24/2015 7.2  6.5 - 8.1 g/dL Final  . Albumin 11/24/2015 4.0  3.5 - 5.0 g/dL Final  . AST 11/24/2015 22  15 - 41 U/L Final  . ALT 11/24/2015 20  14 - 54 U/L Final  . Alkaline Phosphatase 11/24/2015 97  38 - 126 U/L Final  . Total Bilirubin 11/24/2015 0.8  0.3 - 1.2 mg/dL Final  . GFR calc non Af Amer 11/24/2015 >60  >60 mL/min Final  . GFR calc Af Amer 11/24/2015 >60  >60 mL/min Final   Comment: (NOTE) The eGFR has been calculated using the CKD EPI equation. This calculation has not been validated in all clinical situations. eGFR's persistently <60 mL/min signify possible Chronic Kidney Disease.   . Anion gap 11/24/2015 8  5 -  15 Final    STUDIES: Repeat mammogram for June of 2017 has  been ordered.   All lab data has been reviewed and they are within acceptable range  MEDICAL DECISION MAKINCONTINUE LETROZOLE CALCIUM AND VITAMIN D THE EVALUATION IN 6 MONTH ALL LAB DATA HAS BEEN REVIEWED Carcinoma of  breast stage I disease no evidence of recurrent disease   No matching staging information was found for the patient.  Forest Gleason, MD   11/24/2015 10:41 AM

## 2015-11-24 NOTE — Addendum Note (Signed)
Addended by: Livia Snellen on: 11/24/2015 11:33 AM   Modules accepted: Orders

## 2015-12-30 ENCOUNTER — Other Ambulatory Visit: Payer: Self-pay | Admitting: Oncology

## 2016-02-23 ENCOUNTER — Emergency Department
Admission: EM | Admit: 2016-02-23 | Discharge: 2016-02-23 | Disposition: A | Discharge status: Home / Self Care | Payer: Medicare HMO | Attending: Emergency Medicine | Admitting: Emergency Medicine

## 2016-02-23 ENCOUNTER — Encounter: Payer: Self-pay | Admitting: Emergency Medicine

## 2016-02-23 DIAGNOSIS — Z853 Personal history of malignant neoplasm of breast: Secondary | ICD-10-CM | POA: Insufficient documentation

## 2016-02-23 DIAGNOSIS — E119 Type 2 diabetes mellitus without complications: Secondary | ICD-10-CM | POA: Diagnosis not present

## 2016-02-23 DIAGNOSIS — J301 Allergic rhinitis due to pollen: Secondary | ICD-10-CM | POA: Insufficient documentation

## 2016-02-23 DIAGNOSIS — R05 Cough: Secondary | ICD-10-CM | POA: Insufficient documentation

## 2016-02-23 DIAGNOSIS — Z8673 Personal history of transient ischemic attack (TIA), and cerebral infarction without residual deficits: Secondary | ICD-10-CM | POA: Diagnosis not present

## 2016-02-23 DIAGNOSIS — J0100 Acute maxillary sinusitis, unspecified: Secondary | ICD-10-CM | POA: Diagnosis not present

## 2016-02-23 DIAGNOSIS — I10 Essential (primary) hypertension: Secondary | ICD-10-CM | POA: Insufficient documentation

## 2016-02-23 DIAGNOSIS — R0981 Nasal congestion: Secondary | ICD-10-CM | POA: Diagnosis present

## 2016-02-23 DIAGNOSIS — R1110 Vomiting, unspecified: Secondary | ICD-10-CM | POA: Diagnosis not present

## 2016-02-23 MED ORDER — ONDANSETRON 4 MG PO TBDP: 4.0000 mg | ORAL_TABLET | Freq: Four times a day (QID) | ORAL | Status: DC | PRN

## 2016-02-23 MED ORDER — ONDANSETRON 4 MG PO TBDP
ORAL_TABLET | ORAL | Status: AC
  Filled 2016-02-23: qty 1

## 2016-02-23 MED ORDER — ONDANSETRON 4 MG PO TBDP
4.0000 mg | ORAL_TABLET | Freq: Once | ORAL | Status: AC | PRN
  Administered 2016-02-23: 4 mg via ORAL

## 2016-02-23 MED ORDER — BENZONATATE 100 MG PO CAPS: 100.0000 mg | ORAL_CAPSULE | Freq: Three times a day (TID) | ORAL | Status: DC | PRN

## 2016-02-23 MED ORDER — FLUTICASONE PROPIONATE 50 MCG/ACT NA SUSP: 2.0000 | Freq: Every day | NASAL | Status: DC

## 2016-02-23 NOTE — ED Notes (Addendum)
Pt presents to ED with frequent vomiting since this morning. Pt states she has a cough and sinus congestion for the past week and she believes that everything is now draining down into her stomach making her vomit. Denies pain. No increased work of breathing or distress noted at this time.

## 2016-02-23 NOTE — Discharge Instructions (Signed)
Allergic Rhinitis Allergic rhinitis is when the mucous membranes in the nose respond to allergens. Allergens are particles in the air that cause your body to have an allergic reaction. This causes you to release allergic antibodies. Through a chain of events, these eventually cause you to release histamine into the blood stream. Although meant to protect the body, it is this release of histamine that causes your discomfort, such as frequent sneezing, congestion, and an itchy, runny nose.  CAUSES Seasonal allergic rhinitis (hay fever) is caused by pollen allergens that may come from grasses, trees, and weeds. Year-round allergic rhinitis (perennial allergic rhinitis) is caused by allergens such as house dust mites, pet dander, and mold spores. SYMPTOMS  Nasal stuffiness (congestion).  Itchy, runny nose with sneezing and tearing of the eyes. DIAGNOSIS Your health care provider can help you determine the allergen or allergens that trigger your symptoms. If you and your health care provider are unable to determine the allergen, skin or blood testing may be used. Your health care provider will diagnose your condition after taking your health history and performing a physical exam. Your health care provider may assess you for other related conditions, such as asthma, pink eye, or an ear infection. TREATMENT Allergic rhinitis does not have a cure, but it can be controlled by:  Medicines that block allergy symptoms. These may include allergy shots, nasal sprays, and oral antihistamines.  Avoiding the allergen. Hay fever may often be treated with antihistamines in pill or nasal spray forms. Antihistamines block the effects of histamine. There are over-the-counter medicines that may help with nasal congestion and swelling around the eyes. Check with your health care provider before taking or giving this medicine. If avoiding the allergen or the medicine prescribed do not work, there are many new medicines  your health care provider can prescribe. Stronger medicine may be used if initial measures are ineffective. Desensitizing injections can be used if medicine and avoidance does not work. Desensitization is when a patient is given ongoing shots until the body becomes less sensitive to the allergen. Make sure you follow up with your health care provider if problems continue. HOME CARE INSTRUCTIONS It is not possible to completely avoid allergens, but you can reduce your symptoms by taking steps to limit your exposure to them. It helps to know exactly what you are allergic to so that you can avoid your specific triggers. SEEK MEDICAL CARE IF:  You have a fever.  You develop a cough that does not stop easily (persistent).  You have shortness of breath.  You start wheezing.  Symptoms interfere with normal daily activities.   This information is not intended to replace advice given to you by your health care provider. Make sure you discuss any questions you have with your health care provider.   Document Released: 07/20/2001 Document Revised: 11/15/2014 Document Reviewed: 07/02/2013 Elsevier Interactive Patient Education 2016 Elsevier [employer]  Cough, Adult A cough helps to clear your throat and lungs. A cough may last only 2-3 weeks (acute), or it may last longer than 8 weeks (chronic). Many different things can cause a cough. A cough may be a sign of an illness or another medical condition. HOME CARE  Pay attention to any changes in your cough.  Take medicines only as told by your doctor.  If you were prescribed an antibiotic medicine, take it as told by your doctor. Do not stop taking it even if you start to feel better.  Talk with your doctor before you  try using a cough medicine.  Drink enough fluid to keep your pee (urine) clear or pale yellow.  If the air is dry, use a cold steam vaporizer or humidifier in your home.  Stay away from things that make you cough at work or at  home.  If your cough is worse at night, try using extra pillows to raise your head up higher while you sleep.  Do not smoke, and try not to be around smoke. If you need help quitting, ask your doctor.  Do not have caffeine.  Do not drink alcohol.  Rest as needed. GET HELP IF:  You have new problems (symptoms).  You cough up yellow fluid (pus).  Your cough does not get better after 2-3 weeks, or your cough gets worse.  Medicine does not help your cough and you are not sleeping well.  You have pain that gets worse or pain that is not helped with medicine.  You have a fever.  You are losing weight and you do not know why.  You have night sweats. GET HELP RIGHT AWAY IF:  You cough up blood.  You have trouble breathing.  Your heartbeat is very fast.   This information is not intended to replace advice given to you by your health care provider. Make sure you discuss any questions you have with your health care provider.   Document Released: 07/08/2011 Document Revised: 07/16/2015 Document Reviewed: 01/01/2015 Elsevier Interactive Patient Education 2016 [place] Fever  Hay fever is a type of allergy that people have to things like grass, animals, or pollen from plants and flowers. It cannot be passed from one person to another. You cannot cure hay fever, but there are things that may help relieve your problems (symptoms). HOME CARE  Avoid the things that may be causing your problems.  Take all medicine as told by your doctor. GET HELP RIGHT AWAY IF:  You have asthma, a cough, and you start making whistling sounds when breathing (wheezing).  Your tongue or lips are puffy (swollen).  You have trouble breathing.  You feel lightheaded or like you will pass out (faint).  You have a fever.  Your problems are getting worse and your medicine is not helping.  Your treatment was working, but your problems have come back.  You are stuffed up (congested) and  have pressure in your face.  You have a headache.  You have cold sweats. MAKE SURE YOU:  Understand these instructions.  Will watch your condition.  Will get help right away if you are not doing well or get worse.   This information is not intended to replace advice given to you by your health care provider. Make sure you discuss any questions you have with your health care provider.   Document Released: 02/24/2011 Document Revised: 01/17/2012 Document Reviewed: 05/07/2015 Elsevier Interactive Patient Education [employer].  Your exam is essentially normal today. Take the prescription meds as directed. Follow-up with Dr. Lennox Grumbles or return to the ED for worsening symptoms.

## 2016-02-24 NOTE — ED Provider Notes (Signed)
[facility] Emergency Department Provider Note ____________________________________________  Time seen: 2217  I have reviewed the triage vital signs and the nursing notes.  HISTORY  Chief Complaint  Emesis; Nasal Congestion; and Cough   HPI Elizabeth [name] is a 65 y.o. female presents to the ED for evaluation of cough and congestion as well as runny nose. Patient describes a dry cough without fevers over the last week. She also has significant sinus congestion and drainage last week. Today she experienced several episodes of nonbilious, nonbloody vomitus. She describes it as clear and mucousy. She denies any bowel changes including diarrhea or constipation. She notes some mild discomfort to the right upper quadrant of the abdomen. She otherwise notes normal appetite and intake. She has been taking her daily allergy medicine for symptom relief. She also dosed an over-the-counter cough medicine last week with some benefit. She presents today for evaluation of her symptoms. She denies any chest pain, shortness breath, or wheezing. She believes that her vomiting today is due to excessive mucus and phlegm drainage into the stomach from last week.Overall she denies any pain on presentation.  Past Medical History  Diagnosis Date  . Varicose veins   . Diabetes mellitus without complication ([place])   . Lymphedema   . PONV (postoperative nausea and vomiting)   . Stroke ([place]) 2000    residual left arm weakness  . Cancer [facility])     breast  . Carcinoma of right breast ([place]) 05/20/2014  . Psoriasis 1990  . Hypertension     Patient Active Problem List   Diagnosis Date Noted  . Morbid obesity ([place]) 09/23/2015  . Diabetes mellitus, type 2 ([place]) 05/21/2015  . Peripheral blood vessel disorder ([place]) 05/21/2015  . Carcinoma of right breast ([place]) 05/21/2015  . Chronic venous insufficiency 02/05/2015  . Mixed incontinence 10/24/2014  . Extreme obesity ([place]) 10/24/2014  . Hernia,  rectovaginal 10/24/2014  . Airway hyperreactivity 02/19/2013  . Cerebral vascular accident ([place]) 02/19/2013  . Clinical depression 02/19/2013  . Diabetes mellitus type 2 in obese ([place]) 02/19/2013  . Colon, diverticulosis 02/19/2013  . Acid reflux 02/19/2013  . HLD (hyperlipidemia) 02/19/2013  . BP (high blood pressure) 02/19/2013    Past Surgical History  Procedure Laterality Date  . Breast reduction surgery  1977  . Breast excisional biopsy Right 2015    positive, with radiation  . Breast lumpectomy Right 04-2014    followed by radiation,  no chemo  . Eye surgery Left 2015    cataracts and left eye detached retina repair  . Eye surgery Right 2013    cataract with len implant  . Dilation and curettage of uterus  2010  . 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 Cholecystectomy N/A 09/23/2015    Procedure: LAPAROSCOPIC CHOLECYSTECTOMY;  Surgeon: Bonner Puna, MD;  Location: [facility] ORS;  Service: General;  Laterality: N/A;  . Hiatal hernia repair N/A 09/23/2015    Procedure: LAPAROSCOPIC REPAIR OF HIATAL HERNIA;  Surgeon: Bonner Puna, MD;  Location: [facility] ORS;  Service: General;  Laterality: N/A;    Current Outpatient Rx  Name  Route  Sig  Dispense  Refill  . albuterol (PROVENTIL HFA;VENTOLIN HFA) 108 (90 BASE) MCG/ACT inhaler   Inhalation   Inhale 2 puffs into the lungs every 6 (six) hours as needed for wheezing or shortness of breath.         [name]  aspirin 81 MG tablet   Oral   Take 81 mg by mouth daily. In am         . atenolol (TENORMIN) 50 MG tablet   Oral   Take 50 mg by mouth daily. In am         . benzonatate (TESSALON PERLES) 100 MG capsule   Oral   Take 1 capsule (100 mg total) by mouth 3 (three) times daily as needed for cough (Take 1-2 per dose).   30 capsule   0   . brimonidine (ALPHAGAN) 0.2 % ophthalmic solution   Left Eye   Place  1 drop into the left eye 2 (two) times daily.         . Calcium-Vitamin D (CALTRATE 600 PLUS-VIT D PO)   Oral   Take 2 tablets by mouth every morning.          . ciprofloxacin (CIPRO) 500 MG tablet               . fluticasone (FLONASE) 50 MCG/ACT nasal spray   Each Nare   Place 2 sprays into both nostrils daily.   16 g   0   . gabapentin (NEURONTIN) 300 MG capsule   Oral   Take 300 mg by mouth 3 (three) times daily.         [name] HYDROcodone-acetaminophen (HYCET) 7.5-325 mg/15 ml solution   Oral   Take 10 mLs by mouth 4 (four) times daily as needed for moderate pain.   473 mL   0   . insulin lispro (HUMALOG) 100 UNIT/ML injection   Subcutaneous   Inject into the skin 3 (three) times daily before meals.         [name] letrozole (FEMARA) 2.5 MG tablet      TAKE ONE TABLET BY MOUTH ONCE DAILY   30 tablet   0   . ondansetron (ZOFRAN ODT) 4 MG disintegrating tablet   Oral   Take 1 tablet (4 mg total) by mouth every 6 (six) hours as needed for nausea or vomiting.   15 tablet   0   . pravastatin (PRAVACHOL) 40 MG tablet   Oral   Take 40 mg by mouth daily. In afternoon         . ranitidine (ZANTAC) 150 MG tablet   Oral   Take 150 mg by mouth 2 (two) times daily.         [name] sulfamethoxazole-trimethoprim (BACTRIM DS,SEPTRA DS) 800-160 MG tablet   Oral   Take 1 tablet by mouth 2 (two) times daily.         . timolol (BETIMOL) 0.5 % ophthalmic solution   Left Eye   Place 1 drop into the left eye 2 (two) times daily.          Allergies Review of patient's allergies indicates no known allergies.  Family History  Problem Relation Age of Onset  . Diabetes Mother   . Cancer Mother   . Breast cancer Mother 14  . Diabetes Father   . Diabetes Sister   . Diabetes Brother     Social History Social History  Substance Use Topics  . Smoking status: Never Smoker   . Smokeless tobacco: None  . Alcohol Use: No   Review of Systems  Constitutional: Negative  for fever. Eyes: Negative for visual changes. ENT: Negative for sore throat. Reports sinus drainage, congestion as above. Cardiovascular: Negative for chest pain. Respiratory: Negative for shortness of breath. Gastrointestinal: Negative for  abdominal pain, and diarrhea. Reports vomiting as above. Genitourinary: Negative for dysuria. Musculoskeletal: Negative for back pain. Skin: Negative for rash. Neurological: Negative for headaches, focal weakness or numbness. ____________________________________________  PHYSICAL EXAM:  VITAL SIGNS: ED Triage Vitals  Enc Vitals Group     BP 02/23/16 2110 157/78 mmHg     Pulse Rate 02/23/16 2110 62     Resp 02/23/16 2110 18     Temp 02/23/16 2110 98.3 F (36.8 C)     Temp Source 02/23/16 2110 Oral     SpO2 02/23/16 2110 97 %     Weight 02/23/16 2110 200 lb (90.719 kg)     Height 02/23/16 2110 5\' 3"  (1.6 m)     Head Cir --      Peak Flow --      Pain Score 02/23/16 2111 0     Pain Loc --      Pain Edu? --      Excl. in [place]? --    Constitutional: Alert and oriented. Well appearing and in no distress. Head: Normocephalic and atraumatic.      Eyes: Conjunctivae are normal. PERRL. Normal extraocular movements      Ears: Canals clear. TMs intact bilaterally.   Nose: No congestion/rhinorrhea.   Mouth/Throat: Mucous membranes are moist.   Neck: Supple. No thyromegaly. Hematological/Lymphatic/Immunological: No cervical lymphadenopathy. Cardiovascular: Normal rate, regular rhythm.  Respiratory: Normal respiratory effort. No wheezes/rales/rhonchi. Gastrointestinal: Soft and nontender. No distention, rebound, guarding, rigidity, tenderness, or organomegaly. No CVA tenderness. Normal bowel sounds 4. Musculoskeletal: Nontender with normal range of motion in all extremities.  Neurologic:  Normal gait without ataxia. Normal speech and language. No gross focal neurologic deficits are appreciated. Skin:  Skin is warm, dry and intact. No rash  noted. Psychiatric: Mood and affect are normal. Patient exhibits appropriate insight and judgment. ____________________________________________  PROCEDURES  Zofran 4 m gODT ____________________________________________  INITIAL IMPRESSION / ASSESSMENT AND PLAN / ED COURSE  Patient with a normal exam today without evidence of an acute abdominal process. She appears to have a history symptoms related to seasonal allergies. She is discharged with instruction and management of her allergic rhinitis with Flonase, Tessalon Perles, and Zofran for nausea. She will follow with primary care provider as needed. She should return to the ED for acute signs of respiratory distress or intractable nausea and vomiting. ____________________________________________  FINAL CLINICAL IMPRESSION(S) / ED DIAGNOSES  Final diagnoses:  Allergic rhinitis due to pollen  Acute maxillary sinusitis, recurrence not specified      Melvenia Needles, PA-C 02/24/16 0006  Harvest Dark, MD 02/24/16 [phone]

## 2016-04-13 ENCOUNTER — Ambulatory Visit
Admission: RE | Admit: 2016-04-13 | Discharge: 2016-04-13 | Disposition: A | Discharge status: Home / Self Care | Payer: Medicare HMO | Source: Ambulatory Visit | Attending: Oncology | Admitting: Oncology

## 2016-04-13 ENCOUNTER — Other Ambulatory Visit: Payer: Self-pay | Admitting: Oncology

## 2016-04-13 DIAGNOSIS — Z853 Personal history of malignant neoplasm of breast: Secondary | ICD-10-CM | POA: Insufficient documentation

## 2016-04-13 DIAGNOSIS — C50911 Malignant neoplasm of unspecified site of right female breast: Secondary | ICD-10-CM

## 2016-04-13 DIAGNOSIS — Z13820 Encounter for screening for osteoporosis: Secondary | ICD-10-CM | POA: Diagnosis present

## 2016-04-13 IMAGING — MG MM DIGITAL DIAGNOSTIC BILAT W/ TOMO W/ CAD
8 of 14 series · 8 of 30 positions shown · non-contrast
Comparison: Previous exam(s).

CLINICAL DATA: Patient underwent a right breast lumpectomy for
breast carcinoma in [LS]. She has also had previous bilateral breast
reduction surgery. No current complaints. Patient has lost
significant weight following gastric bypass surgery.

EXAM:
2D DIGITAL DIAGNOSTIC BILATERAL MAMMOGRAM WITH CAD AND ADJUNCT TOMO

[R MLO (1 of 3)]
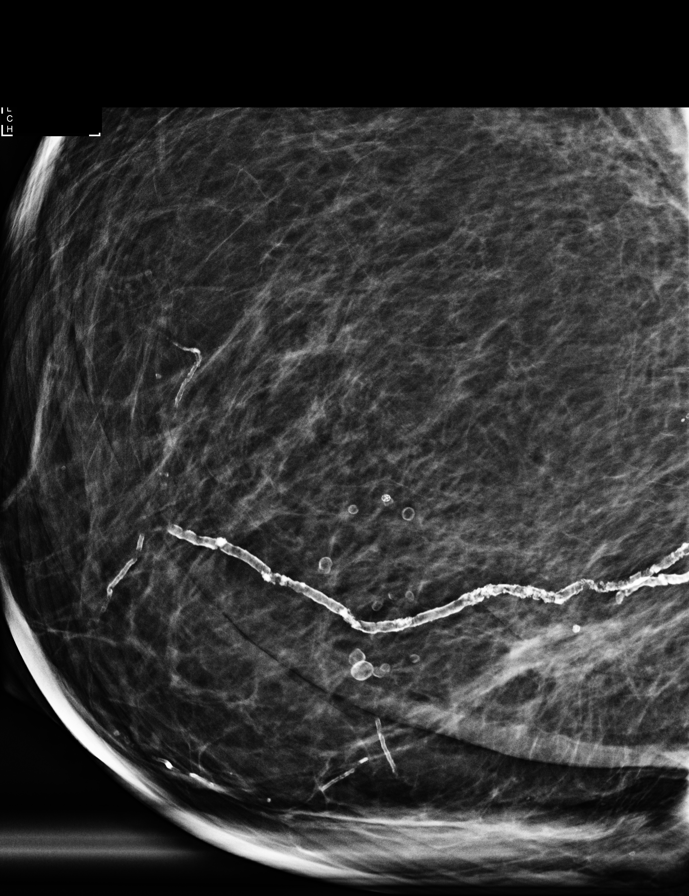

[R MLO (2 of 3)]
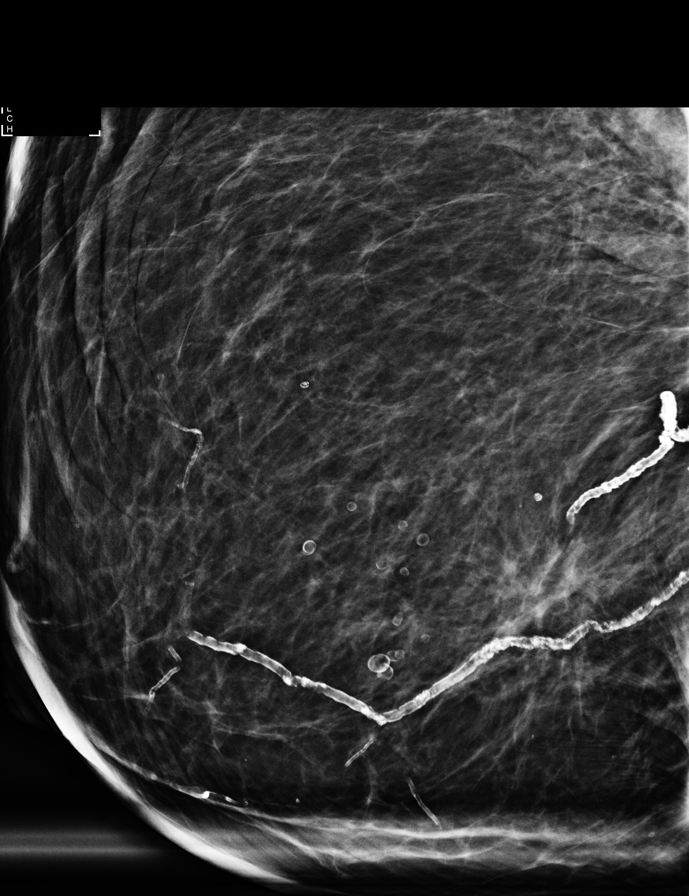

[L CC synth-2D]
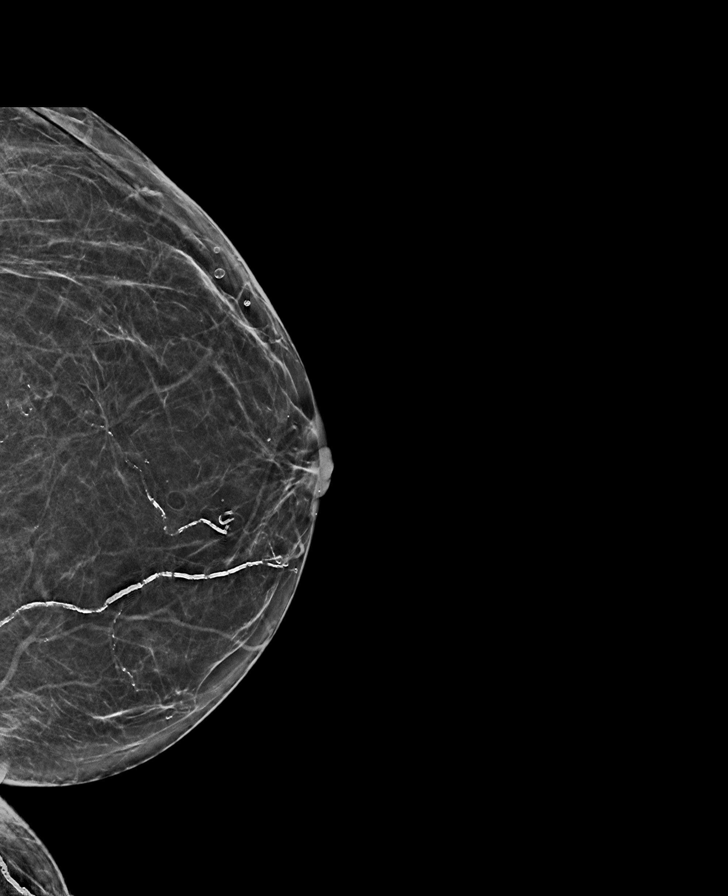

[R MLO (3 of 3)]
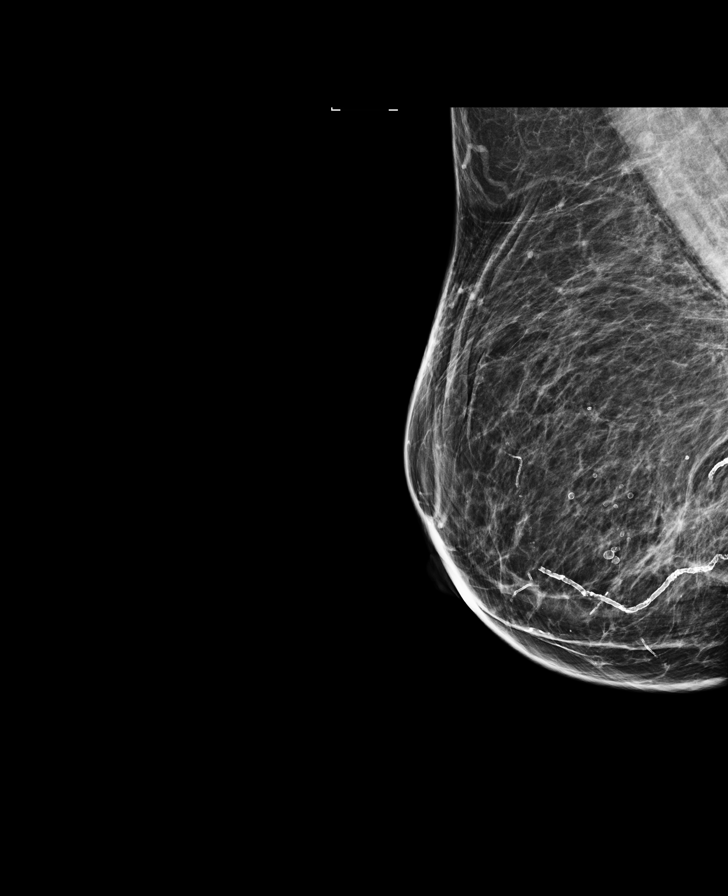

[R CC synth-2D]
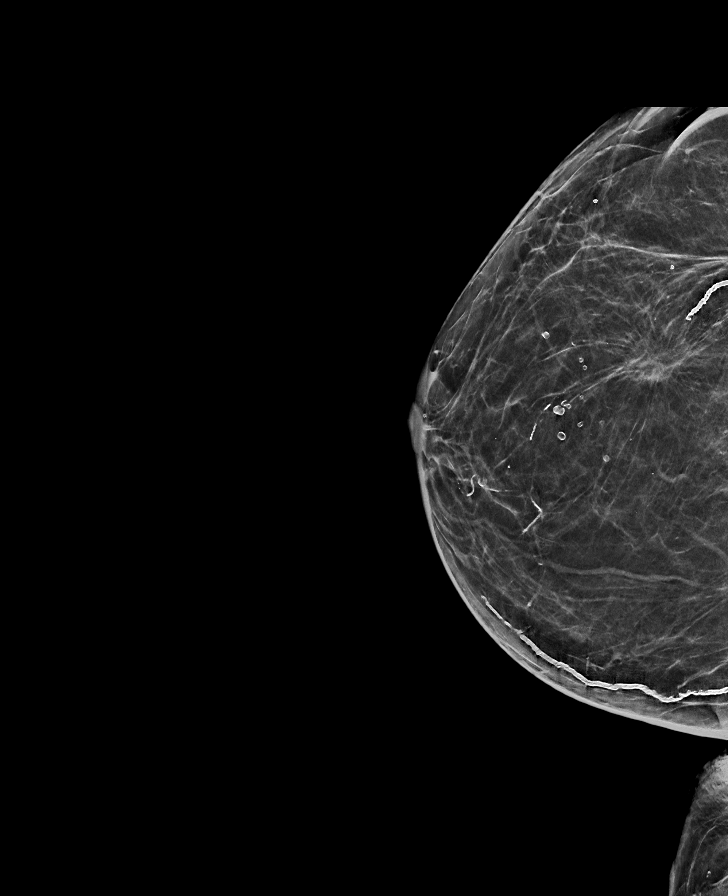

[L CC]
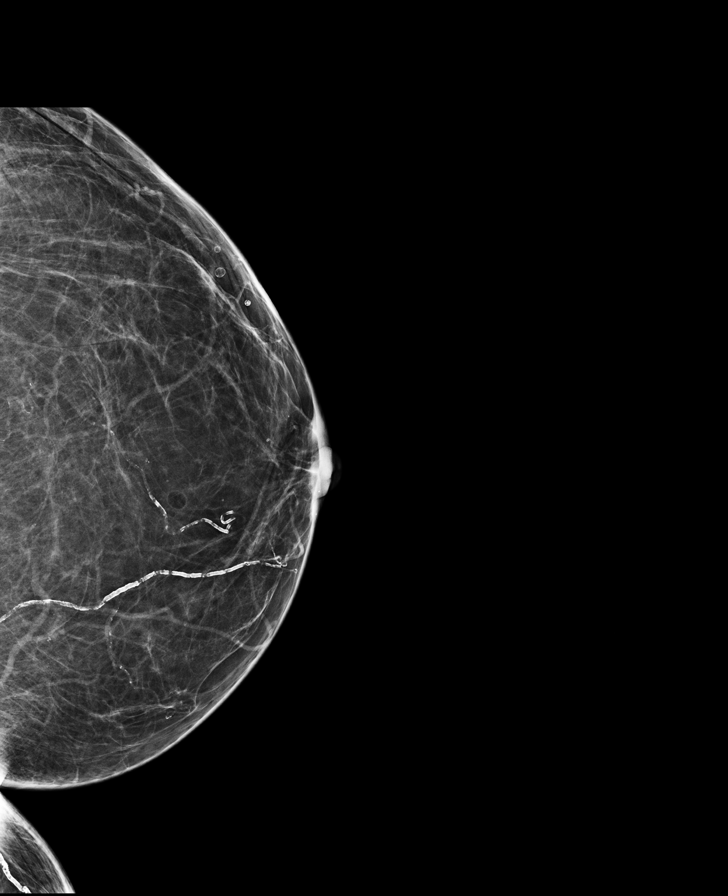

[R CC]
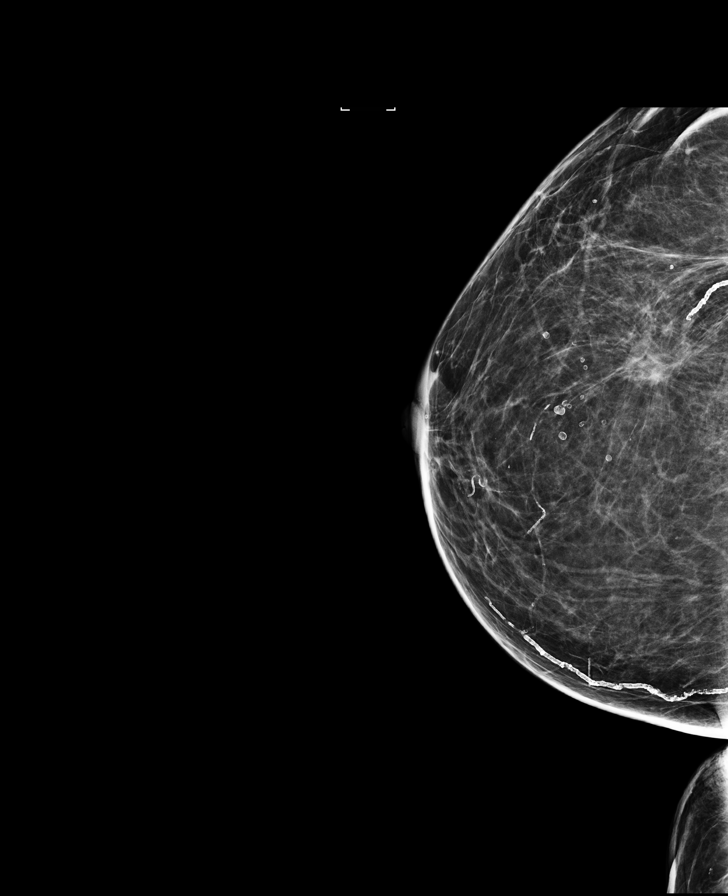

[R MLO synth-2D]
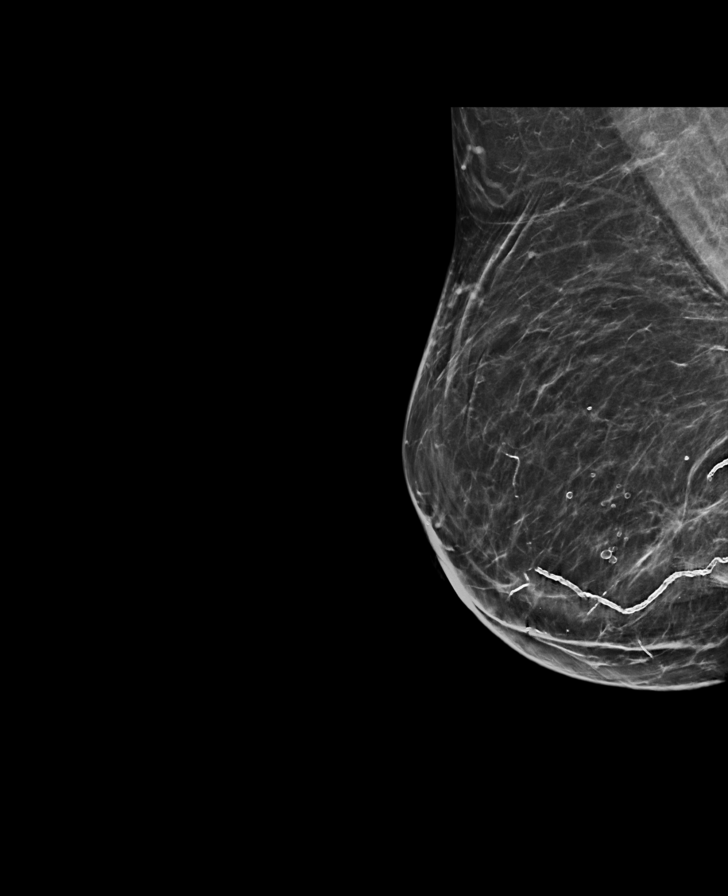

[8 of 30 positions shown; findings below may reference images not displayed]

ACR Breast Density Category b: There are scattered areas of
fibroglandular density.
FINDINGS: Focal opacity and architectural distortion in the inferior outer
quadrant of the right breast reflecting the lumpectomy site is
stable from the most recent prior exam.

There are no discrete masses or other areas significant
architectural distortion. There are no new or suspicious
calcifications.

Mammographic images were processed with CAD.
IMPRESSION: No evidence of recurrent or new breast carcinoma. Benign
postsurgical changes on the right.

RECOMMENDATION:
Diagnostic mammography in 1 year per standard post lumpectomy
protocol.

I have discussed the findings and recommendations with the patient.
Results were also provided in writing at the conclusion of the
visit. If applicable, a reminder letter will be sent to the patient
regarding the next appointment.

BI-RADS CATEGORY  2: Benign.

## 2016-05-19 ENCOUNTER — Other Ambulatory Visit: Payer: Self-pay | Admitting: Oncology

## 2016-05-24 ENCOUNTER — Ambulatory Visit: Payer: Medicare HMO | Admitting: Oncology

## 2016-05-24 ENCOUNTER — Other Ambulatory Visit: Payer: Medicare HMO

## 2016-05-25 ENCOUNTER — Inpatient Hospital Stay: Payer: Medicare HMO | Attending: Oncology | Admitting: Oncology

## 2016-05-25 ENCOUNTER — Inpatient Hospital Stay: Payer: Medicare HMO

## 2016-05-25 VITALS — BP 101/71 | HR 76 | Temp 96.8°F | Resp 16 | Wt 189.0 lb

## 2016-05-25 DIAGNOSIS — E119 Type 2 diabetes mellitus without complications: Secondary | ICD-10-CM

## 2016-05-25 DIAGNOSIS — Z8673 Personal history of transient ischemic attack (TIA), and cerebral infarction without residual deficits: Secondary | ICD-10-CM | POA: Diagnosis not present

## 2016-05-25 DIAGNOSIS — I10 Essential (primary) hypertension: Secondary | ICD-10-CM

## 2016-05-25 DIAGNOSIS — Z79899 Other long term (current) drug therapy: Secondary | ICD-10-CM | POA: Diagnosis not present

## 2016-05-25 DIAGNOSIS — C50911 Malignant neoplasm of unspecified site of right female breast: Secondary | ICD-10-CM

## 2016-05-25 DIAGNOSIS — Z170 Estrogen receptor positive status [ER+]: Secondary | ICD-10-CM | POA: Diagnosis not present

## 2016-05-25 DIAGNOSIS — Z923 Personal history of irradiation: Secondary | ICD-10-CM | POA: Diagnosis not present

## 2016-05-25 DIAGNOSIS — Z803 Family history of malignant neoplasm of breast: Secondary | ICD-10-CM | POA: Diagnosis not present

## 2016-05-25 DIAGNOSIS — Z7982 Long term (current) use of aspirin: Secondary | ICD-10-CM | POA: Insufficient documentation

## 2016-05-25 DIAGNOSIS — L409 Psoriasis, unspecified: Secondary | ICD-10-CM | POA: Diagnosis not present

## 2016-05-25 DIAGNOSIS — Z794 Long term (current) use of insulin: Secondary | ICD-10-CM | POA: Diagnosis not present

## 2016-05-25 DIAGNOSIS — Z79811 Long term (current) use of aromatase inhibitors: Secondary | ICD-10-CM | POA: Diagnosis not present

## 2016-05-25 LAB — COMPREHENSIVE METABOLIC PANEL
ALBUMIN: 4.1 g/dL (ref 3.5–5.0)
ALK PHOS: 90 U/L (ref 38–126)
ALT: 15 U/L (ref 14–54)
AST: 17 U/L (ref 15–41)
Anion gap: 6 (ref 5–15)
BUN: 34 mg/dL — AB (ref 6–20)
CO2: 28 mmol/L (ref 22–32)
Calcium: 9.2 mg/dL (ref 8.9–10.3)
Chloride: 105 mmol/L (ref 101–111)
Creatinine, Ser: 1.09 mg/dL — ABNORMAL HIGH (ref 0.44–1.00)
GFR calc Af Amer: >60 mL/min (ref >60)
GFR calc non Af Amer: 52 mL/min — ABNORMAL LOW (ref >60)
GLUCOSE: 162 mg/dL — AB (ref 65–99)
POTASSIUM: 4.3 mmol/L (ref 3.5–5.1)
Sodium: 139 mmol/L (ref 135–145)
TOTAL PROTEIN: 6.9 g/dL (ref 6.5–8.1)
Total Bilirubin: 0.6 mg/dL (ref 0.3–1.2)

## 2016-05-25 LAB — CBC WITH DIFFERENTIAL/PLATELET
BASOS ABS: 0.1 10*3/uL (ref 0–0.1)
BASOS PCT: 1 %
Eosinophils Absolute: 0.2 10*3/uL (ref 0–0.7)
Eosinophils Relative: 2 %
HEMATOCRIT: 38.1 % (ref 35.0–47.0)
HEMOGLOBIN: 12.9 g/dL (ref 12.0–16.0)
Lymphocytes Relative: 16 %
Lymphs Abs: 1.5 10*3/uL (ref 1.0–3.6)
MCH: 29.1 pg (ref 26.0–34.0)
MCHC: 33.9 g/dL (ref 32.0–36.0)
MCV: 86 fL (ref 80.0–100.0)
Monocytes Absolute: 0.6 10*3/uL (ref 0.2–0.9)
Monocytes Relative: 6 %
Neutro Abs: 7.1 10*3/uL — ABNORMAL HIGH (ref 1.4–6.5)
Neutrophils Relative %: 75 %
Platelets: 226 10*3/uL (ref 150–440)
RBC: 4.43 MIL/uL (ref 3.80–5.20)
RDW: 13.7 % (ref 11.5–14.5)
WBC: 9.4 10*3/uL (ref 3.6–11.0)

## 2016-05-25 MED ORDER — LETROZOLE 2.5 MG PO TABS: 2.5000 mg | ORAL_TABLET | Freq: Every day | ORAL | Status: DC

## 2016-05-25 NOTE — Progress Notes (Signed)
[place]  [phone]  [phone]     Elizabeth [name] DOB: Mar 31, 1951  MR#: [id number]  ZJI#:[id number]  Patient Care Team: Marguerita Merles, MD as PCP - General (Family Medicine) Christin Fudge, MD as Consulting Physician (Surgery)  CHIEF COMPLAINT:  Chief Complaint  Patient presents with  . Follow-up    INTERVAL HISTORY: Patient returns to clinic for 6 month follow up of breast cancer. She currently feels well and is asymptomatic. She continues letrozole without side effects. She would like a 90 day supply of letrozole in the future. She has declined a breast exam today.  She does take calcium and vitamin D. She has no concerns today.  REVIEW OF SYSTEMS:   Review of Systems  Constitutional: Negative.   HENT: Negative.   Eyes: Negative.   Respiratory: Negative.   Cardiovascular: Negative.   Gastrointestinal: Negative.   Genitourinary: Negative.   Musculoskeletal: Negative.   Skin: Negative.   Neurological: Negative.   Endo/Heme/Allergies: Negative.   Psychiatric/Behavioral: Negative.     As per HPI. Otherwise, a complete review of systems is negatve.  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  HER-2 receptor negative.  Diagnosis in June of 2015  2.status post partial lumpectomy on May 02, 2014 pT1b  pN0 M0  stage 1B 3.starting radiation to the breast followed by letrozole vitamin D and calciumfor 5 years     Carcinoma of right breast [facility])    PAST MEDICAL HISTORY: Past Medical History  Diagnosis Date  . Varicose veins   . Diabetes mellitus without complication ([place])   . Lymphedema   . PONV (postoperative nausea and vomiting)   . Stroke ([place]) 2000    residual left arm weakness  . [facility])     breast  . Carcinoma of right breast ([place]) 05/20/2014  . Psoriasis 1990  . Hypertension     PAST SURGICAL  HISTORY: Past Surgical History  Procedure Laterality Date  . Breast reduction surgery  1977  . Breast excisional biopsy Right 2015    positive, with radiation  . Breast lumpectomy Right 04-2014    followed by radiation,  no chemo  . Eye surgery Left 2015    cataracts and left eye detached retina repair  . Eye surgery Right 2013    cataract with len implant  . Dilation and curettage of uterus  2010  . 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 Cholecystectomy N/A 09/23/2015    Procedure: LAPAROSCOPIC CHOLECYSTECTOMY;  Surgeon: Bonner Puna, MD;  Location: [facility] ORS;  Service: General;  Laterality: N/A;  . Hiatal hernia repair N/A 09/23/2015    Procedure: LAPAROSCOPIC REPAIR OF HIATAL HERNIA;  Surgeon: Bonner Puna, MD;  Location: [facility] ORS;  Service: General;  Laterality: N/A;  . Reduction mammaplasty      FAMILY HISTORY Family History  Problem Relation Age of Onset  . Diabetes Mother   . Cancer Mother   . Breast cancer Mother 14  . Diabetes Father   . Diabetes Sister   . Diabetes Brother     GYNECOLOGIC HISTORY:  No LMP recorded. Patient is postmenopausal.     ADVANCED DIRECTIVES:    HEALTH MAINTENANCE: Social History  Substance Use Topics  . Smoking status: Never  Smoker   . Smokeless tobacco: Not on file  . Alcohol Use: No    No Known Allergies  Current Outpatient Prescriptions  Medication Sig Dispense Refill  . albuterol (PROVENTIL HFA;VENTOLIN HFA) 108 (90 BASE) MCG/ACT inhaler Inhale 2 puffs into the lungs every 6 (six) hours as needed for wheezing or shortness of breath.    . Calcium-Vitamin D (CALTRATE 600 PLUS-VIT D PO) Take 2 tablets by mouth every morning.     . clopidogrel (PLAVIX) 75 MG tablet Take 75 mg by mouth.    . gabapentin (NEURONTIN) 300 MG capsule Take 300 mg by mouth 3 (three) times daily.    [name] letrozole (FEMARA) 2.5 MG  tablet Take 1 tablet (2.5 mg total) by mouth daily. 90 tablet 1  . pravastatin (PRAVACHOL) 40 MG tablet Take 40 mg by mouth daily. In afternoon    . aspirin 81 MG tablet Take 81 mg by mouth daily. Reported on 05/25/2016    . atenolol (TENORMIN) 50 MG tablet Take 50 mg by mouth daily. Reported on 05/25/2016    . benzonatate (TESSALON PERLES) 100 MG capsule Take 1 capsule (100 mg total) by mouth 3 (three) times daily as needed for cough (Take 1-2 per dose). (Patient not taking: Reported on 05/25/2016) 30 capsule 0  . brimonidine (ALPHAGAN) 0.2 % ophthalmic solution Place 1 drop into the left eye 2 (two) times daily. Reported on 05/25/2016    . ciprofloxacin (CIPRO) 500 MG tablet Reported on 05/25/2016    . fluticasone (FLONASE) 50 MCG/ACT nasal spray Place 2 sprays into both nostrils daily. (Patient not taking: Reported on 05/25/2016) 16 g 0  . HYDROcodone-acetaminophen (HYCET) 7.5-325 mg/15 ml solution Take 10 mLs by mouth 4 (four) times daily as needed for moderate pain. (Patient not taking: Reported on 05/25/2016) 473 mL 0  . insulin lispro (HUMALOG) 100 UNIT/ML injection Inject into the skin 3 (three) times daily before meals. Reported on 05/25/2016    . ondansetron (ZOFRAN ODT) 4 MG disintegrating tablet Take 1 tablet (4 mg total) by mouth every 6 (six) hours as needed for nausea or vomiting. (Patient not taking: Reported on 05/25/2016) 15 tablet 0  . ranitidine (ZANTAC) 150 MG tablet Take 150 mg by mouth 2 (two) times daily. Reported on 05/25/2016    . sulfamethoxazole-trimethoprim (BACTRIM DS,SEPTRA DS) 800-160 MG tablet Take 1 tablet by mouth 2 (two) times daily. Reported on 05/25/2016    . timolol (BETIMOL) 0.5 % ophthalmic solution Place 1 drop into the left eye 2 (two) times daily. Reported on 05/25/2016     No current facility-administered medications for this visit.    OBJECTIVE: BP 101/71 mmHg  Pulse 76  Temp(Src) 96.8 F (36 C) (Tympanic)  Resp 16  Wt 189 lb 0.7 oz (85.75 kg)   Body mass  index is 33.5 kg/(m^2).    ECOG FS:0 - Asymptomatic  General: Well-developed, well-nourished, no acute distress. Eyes: Pink conjunctiva, anicteric sclera. HEENT: Normocephalic, moist mucous membranes, clear oropharnyx. Lungs: Clear to auscultation bilaterally. Heart: Regular rate and rhythm. No rubs, murmurs, or gallops. Abdomen: Soft, nontender, nondistended. No organomegaly noted, normoactive bowel sounds. Breast: Patient declined breast exam. Musculoskeletal: No edema, cyanosis, or clubbing. Neuro: Alert, answering all questions appropriately. Cranial nerves grossly intact. Skin: No rashes or petechiae noted. Psych: Normal affect. Lymphatics: No cervical, calvicular, or axillary  LAD.   LAB RESULTS:  Appointment on 05/25/2016  Component Date Value Ref Range Status  . WBC 05/25/2016 9.4  3.6 - 11.0 K/uL Final  .  RBC 05/25/2016 4.43  3.80 - 5.20 MIL/uL Final  . Hemoglobin 05/25/2016 12.9  12.0 - 16.0 g/dL Final  . HCT 05/25/2016 38.1  35.0 - 47.0 % Final  . MCV 05/25/2016 86.0  80.0 - 100.0 fL Final  . MCH 05/25/2016 29.1  26.0 - 34.0 pg Final  . MCHC 05/25/2016 33.9  32.0 - 36.0 g/dL Final  . RDW 05/25/2016 13.7  11.5 - 14.5 % Final  . Platelets 05/25/2016 226  150 - 440 K/uL Final  . Neutrophils Relative % 05/25/2016 75   Final  . Neutro Abs 05/25/2016 7.1* 1.4 - 6.5 K/uL Final  . Lymphocytes Relative 05/25/2016 16   Final  . Lymphs Abs 05/25/2016 1.5  1.0 - 3.6 K/uL Final  . Monocytes Relative 05/25/2016 6   Final  . Monocytes Absolute 05/25/2016 0.6  0.2 - 0.9 K/uL Final  . Eosinophils Relative 05/25/2016 2   Final  . Eosinophils Absolute 05/25/2016 0.2  0 - 0.7 K/uL Final  . Basophils Relative 05/25/2016 1   Final  . Basophils Absolute 05/25/2016 0.1  0 - 0.1 K/uL Final  . Sodium 05/25/2016 139  135 - 145 mmol/L Final  . Potassium 05/25/2016 4.3  3.5 - 5.1 mmol/L Final  . Chloride 05/25/2016 105  101 - 111 mmol/L Final  . CO2 05/25/2016 28  22 - 32 mmol/L Final  .  Glucose, Bld 05/25/2016 162* 65 - 99 mg/dL Final  . BUN 05/25/2016 34* 6 - 20 mg/dL Final  . Creatinine, Ser 05/25/2016 1.09* 0.44 - 1.00 mg/dL Final  . Calcium 05/25/2016 9.2  8.9 - 10.3 mg/dL Final  . Total Protein 05/25/2016 6.9  6.5 - 8.1 g/dL Final  . Albumin 05/25/2016 4.1  3.5 - 5.0 g/dL Final  . AST 05/25/2016 17  15 - 41 U/L Final  . ALT 05/25/2016 15  14 - 54 U/L Final  . Alkaline Phosphatase 05/25/2016 90  38 - 126 U/L Final  . Total Bilirubin 05/25/2016 0.6  0.3 - 1.2 mg/dL Final  . GFR calc non Af Amer 05/25/2016 52* >60 mL/min Final  . GFR calc Af Amer 05/25/2016 >60  >60 mL/min Final   Comment: (NOTE) The eGFR has been calculated using the CKD EPI equation. This calculation has not been validated in all clinical situations. eGFR's persistently <60 mL/min signify possible Chronic Kidney Disease.   . Anion gap 05/25/2016 6  5 - 15 Final    STUDIES: No results found.  ASSESSMENT & PLAN:    1. Carcinoma right breast Stage 1B,  T1, N0, M0 ER/PR positive HER 2 negative. Diagnosis June 2015. Partial lumpectomy May 02, 2014 followed by radiation. Continue Letrozole for 5 years until July 2020. Mammogram June 2017 Birads 2 repeat in June 2018. Bone density June 2017 normal repeat June 2019. Follow up in 6 months with Dr Grayland Ormond.   Patient expressed understanding and was in agreement with this plan. She also understands that She can call clinic at any time with any questions, concerns, or complaints.   Dr. Grayland Ormond was available for consultation and review of plan of care for this patient.   Mayra Reel, NP   05/25/2016 10:40 AM

## 2016-10-26 ENCOUNTER — Encounter: Payer: Medicare HMO | Attending: Internal Medicine | Admitting: Internal Medicine

## 2016-10-26 DIAGNOSIS — I10 Essential (primary) hypertension: Secondary | ICD-10-CM | POA: Diagnosis not present

## 2016-10-26 DIAGNOSIS — Z8673 Personal history of transient ischemic attack (TIA), and cerebral infarction without residual deficits: Secondary | ICD-10-CM | POA: Diagnosis not present

## 2016-10-26 DIAGNOSIS — H5462 Unqualified visual loss, left eye, normal vision right eye: Secondary | ICD-10-CM | POA: Insufficient documentation

## 2016-10-26 DIAGNOSIS — Z853 Personal history of malignant neoplasm of breast: Secondary | ICD-10-CM | POA: Insufficient documentation

## 2016-10-26 DIAGNOSIS — E11621 Type 2 diabetes mellitus with foot ulcer: Secondary | ICD-10-CM | POA: Diagnosis not present

## 2016-10-26 DIAGNOSIS — L97522 Non-pressure chronic ulcer of other part of left foot with fat layer exposed: Secondary | ICD-10-CM | POA: Insufficient documentation

## 2016-10-26 DIAGNOSIS — Z7984 Long term (current) use of oral hypoglycemic drugs: Secondary | ICD-10-CM | POA: Diagnosis not present

## 2016-10-26 DIAGNOSIS — E1142 Type 2 diabetes mellitus with diabetic polyneuropathy: Secondary | ICD-10-CM | POA: Diagnosis not present

## 2016-10-27 NOTE — Progress Notes (Signed)
SHUKURA, BUTKA ([id number]) Visit Report for 10/26/2016 Allergy List Details Patient Name: [name], [name] Date of Service: 10/26/2016 9:00 AM Medical Record Patient Account Number: [id number] Number: Treating RN: Afful, RN, BSN, Rita [birth year] [phone] y.o. Other Clinician: Date of Birth/Sex: Female) Treating ROBSON, MICHAEL Primary Care Physician/Extender: Mila Palmer, LINDA Physician: Referring Physician: MILES, LINDA Weeks in Treatment: 0 Allergies Active Allergies No Known Allergies Allergy Notes Electronic Signature(s) Signed: 10/26/2016 4:26:19 PM By: Regan Lemming BSN, RN Entered By: Regan Lemming on 10/26/2016 09:10:11 Elizabeth [name] ([id number]) -------------------------------------------------------------------------------- Arrival Information Details Patient Name: Elizabeth [name] L. Date of Service: 10/26/2016 9:00 AM Medical Record Patient Account Number: [id number] Number: Treating RN: Afful, RN, BSN, Rita January 02, 1951 [phone] y.o. Other Clinician: Date of Birth/Sex: Female) Treating ROBSON, MICHAEL Primary Care Physician/Extender: Mila Palmer, LINDA Physician: Referring Physician: MILES, LINDA Weeks in Treatment: 0 Visit Information Patient Arrived: Ambulatory Arrival Time: 09:09 Accompanied By: SELF Transfer Assistance: None Patient Identification Verified: Yes Secondary Verification Process Yes Completed: Patient Requires Transmission-Based No Precautions: Patient Has Alerts: No History Since Last Visit All ordered tests and consults were completed: No Added or deleted any medications: No Any new allergies or adverse reactions: No Had a fall or experienced change in activities of daily living that may affect risk of falls: No Signs or symptoms of abuse/neglect since last visito No Hospitalized since last visit: No Has Dressing in Place as Prescribed: Yes Pain Present Now: No Electronic Signature(s) Signed: 10/26/2016 4:26:19 PM By:  Regan Lemming BSN, RN Entered By: Regan Lemming on 10/26/2016 09:09:58 Elizabeth [name] ([id number]) -------------------------------------------------------------------------------- Clinic Level of Care Assessment Details Patient Name: Elizabeth [name]. Date of Service: 10/26/2016 9:00 AM Medical Record Patient Account Number: [id number] Number: Treating RN: Afful, RN, BSN, Rita 1951/06/11 [phone] y.o. Other Clinician: Date of Birth/Sex: Female) Treating ROBSON, MICHAEL Primary Care Physician/Extender: Mila Palmer, LINDA Physician: Referring Physician: MILES, LINDA Weeks in Treatment: 0 Clinic Level of Care Assessment Items TOOL 1 Quantity Score []  - Use when EandM and Procedure is performed on INITIAL visit 0 ASSESSMENTS - Nursing Assessment / Reassessment X - General Physical Exam (combine w/ comprehensive assessment (listed just 1 20 below) when performed on new pt. evals) X - Comprehensive Assessment (HX, ROS, Risk Assessments, Wounds Hx, etc.) 1 25 ASSESSMENTS - Wound and Skin Assessment / Reassessment []  - Dermatologic / Skin Assessment (not related to wound area) 0 ASSESSMENTS - Ostomy and/or Continence Assessment and Care []  - Incontinence Assessment and Management 0 []  - Ostomy Care Assessment and Management (repouching, etc.) 0 PROCESS - Coordination of Care X - Simple Patient / Family Education for ongoing care 1 15 []  - Complex (extensive) Patient / Family Education for ongoing care 0 X - Staff obtains Programmer, systems, Records, Test Results / Process Orders 1 10 []  - Staff telephones HHA, Nursing Homes / Clarify orders / etc 0 []  - Routine Transfer to another Facility (non-emergent condition) 0 []  - Routine Hospital Admission (non-emergent condition) 0 X - New Admissions / Biomedical engineer / Ordering NPWT, Apligraf, etc. 1 15 []  - Emergency Hospital Admission (emergent condition) 0 PROCESS - Special Needs []  - Pediatric / Minor Patient Management 0 [name],  [name] ([id number]) []  - Isolation Patient Management 0 []  - Hearing / Language / Visual special needs 0 []  - Assessment of Community assistance (transportation, D/C planning, etc.) 0 []  - Additional assistance / Altered mentation 0 []  - Support Surface(s) Assessment (bed, cushion, seat, etc.) 0 INTERVENTIONS - Miscellaneous []  -  External ear exam 0 []  - Patient Transfer (multiple staff / Civil Service fast streamer / Similar devices) 0 []  - Simple Staple / Suture removal (25 or less) 0 []  - Complex Staple / Suture removal (26 or more) 0 []  - Hypo/Hyperglycemic Management (do not check if billed separately) 0 X - Ankle / Brachial Index (ABI) - do not check if billed separately 1 15 Has the patient been seen at the hospital within the last three years: Yes Total Score: 100 Level Of Care: New/Established - Level 3 Electronic Signature(s) Signed: 10/26/2016 4:26:19 PM By: Regan Lemming BSN, RN Entered By: Regan Lemming on 10/26/2016 09:38:21 Elizabeth [name] ([id number]) -------------------------------------------------------------------------------- Encounter Discharge Information Details Patient Name: Elizabeth [name], Teigen L. Date of Service: 10/26/2016 9:00 AM Medical Record Patient Account Number: [id number] Number: Treating RN: Afful, RN, BSN, Rita 06/20/1951 [phone] y.o. Other Clinician: Date of Birth/Sex: Female) Treating ROBSON, MICHAEL Primary Care Physician/Extender: Mila Palmer, LINDA Physician: Referring Physician: Delight Stare Weeks in Treatment: 0 Encounter Discharge Information Items Schedule Follow-up Appointment: No Medication Reconciliation completed and provided to Patient/Care No Matthias Bogus: Provided on Clinical Summary of Care: 10/26/2016 Form Type Recipient Paper Patient [place] Electronic Signature(s) Signed: 10/26/2016 9:55:56 AM By: Ruthine Dose Entered By: Ruthine Dose on 10/26/2016 09:55:56 Elizabeth [name]  ([id number]) -------------------------------------------------------------------------------- Lower Extremity Assessment Details Patient Name: Elizabeth [name], [name] Date of Service: 10/26/2016 9:00 AM Medical Record Patient Account Number: [id number] Number: Treating RN: Afful, RN, BSN, Rita April 26, 1951 [phone] y.o. Other Clinician: Date of Birth/Sex: Female) Treating ROBSON, MICHAEL Primary Care Physician/Extender: Mila Palmer, LINDA Physician: Referring Physician: MILES, LINDA Weeks in Treatment: 0 Edema Assessment Assessed: [Left: No] [Right: No] Edema: [Left: Yes] [Right: Yes] Calf Left: Right: Point of Measurement: 36 cm From Medial Instep 41 cm 41 cm Ankle Left: Right: Point of Measurement: 10 cm From Medial Instep 29.6 cm 29.8 cm Vascular Assessment Claudication: Claudication Assessment [Left:None] [Right:None] Pulses: Dorsalis Pedis Palpable: [Left:Yes] [Right:Yes] Posterior Tibial Popliteal Palpable: [Left:No] [Right:No] Extremity colors, hair growth, and conditions: Extremity Color: [Left:Normal] [Right:Normal] Hair Growth on Extremity: [Left:Yes] [Right:Yes] Temperature of Extremity: [Left:Warm] [Right:Warm] Capillary Refill: [Left:< 3 seconds] [Right:< 3 seconds] Blood Pressure: Brachial: [Left:120] [Right:113] Dorsalis Pedis: 190 [Left:Dorsalis Pedis: 180] Ankle: Posterior Tibial: [Left:Posterior Tibial: 1.58] [Right:1.50] Toe Nail Assessment Left: Right: DAFNY, VELTMAN. ([id number]) Thick: Yes Yes Discolored: Yes Yes Deformed: No No Improper Length and Hygiene: Yes Yes Electronic Signature(s) Signed: 10/26/2016 4:26:19 PM By: Regan Lemming BSN, RN Entered By: Regan Lemming on 10/26/2016 09:21:00 Elizabeth [name] ([id number]) -------------------------------------------------------------------------------- Multi Wound Chart Details Patient Name: Elizabeth [name], Addisson L. Date of Service: 10/26/2016 9:00 AM Medical Record Patient Account Number:  [id number] Number: Treating RN: Afful, RN, BSN, Rita 10-20-1951 [phone] y.o. 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] Comorbid History: [4:Asthma, Hypertension, Peripheral Venous Disease, Type II Diabetes, Neuropathy, Seizure Disorder, Received Radiation] [N/A:N/A] Date Acquired: [4:09/14/2016] [N/A:N/A] Weeks of Treatment: [4:0] [N/A:N/A] Wound Status: [4:Open] [N/A:N/A] Measurements L x W x D 0.7x0.5x0.2 [N/A:N/A] (cm) Area (cm) : [4:0.275] [N/A:N/A] Volume (cm) : [4:0.055] [N/A:N/A] % Reduction in Area: [4:0.00%] [N/A:N/A] % Reduction in Volume: 0.00% [N/A:N/A] Classification: [4:Grade 1] [  N/A:N/A] Exudate Amount: [4:Medium] [N/A:N/A] Exudate Type: [4:Sanguinous] [N/A:N/A] Exudate Color: [4:red] [N/A:N/A] Wound Margin: [4:Distinct, outline attached N/A] Granulation Amount: [4:Small (1-33%)] [N/A:N/A] Granulation Quality: [4:Pink, Pale] [N/A:N/A] Necrotic Amount: Medium (34-66%) N/A N/A Necrotic Tissue: Eschar, Adherent Slough N/A N/A Exposed Structures: Fascia: No N/A N/A Fat: No Tendon: No Muscle: No Joint: No Bone: No Limited to Skin Breakdown Epithelialization: None N/A N/A Debridement: Debridement [id number] N/A N/A [id number]) Pre-procedure 09:32 N/A N/A Verification/Time Out Taken: Pain Control: Lidocaine 4% Topical N/A N/A Solution Tissue Debrided: Necrotic/Eschar, N/A N/A Fibrin/Slough, Subcutaneous Level: Skin/Subcutaneous N/A  N/A Tissue Debridement Area (sq 0.35 N/A N/A cm): Instrument: Curette N/A N/A Bleeding: Minimum N/A N/A Hemostasis Achieved: Pressure N/A N/A Procedural Pain: 0 N/A N/A Post Procedural Pain: 0 N/A N/A Debridement Treatment Procedure was tolerated N/A N/A Response: well Post Debridement 0.7x0.5x0.3 N/A N/A Measurements L x W x D (cm) Post Debridement 0.082 N/A N/A Volume: (cm) Periwound Skin Texture: Edema: No N/A N/A Excoriation: No Induration: No Callus: No Crepitus: No Fluctuance: No Friable: No Rash: No Scarring: No Periwound Skin Moist: Yes N/A N/A Moisture: Dry/Scaly: Yes Maceration: No Periwound Skin Color: N/A N/A Timberlake, Laronica Carlean Jews ([id number]) Atrophie Blanche: No Cyanosis: No Ecchymosis: No Erythema: No Hemosiderin Staining: No Mottled: No Pallor: No Rubor: No Temperature: No Abnormality N/A N/A Tenderness on No N/A N/A Palpation: Wound Preparation: Ulcer Cleansing: N/A N/A Rinsed/Irrigated with Saline Topical Anesthetic Applied: Other: lidocaine 4% Procedures Performed: Debridement N/A N/A Treatment Notes Wound #4 (Left, Plantar Toe Great) 1. Cleansed with: Clean wound with Normal Saline 4. Dressing Applied: Aquacel Ag 5. Secondary Dressing Applied Dry Gauze Foam Kerlix/Conform 6. Footwear/Offloading device applied Other footwear/offloading device applied (specify in notes) 7. Secured with Recruitment consultant) Signed: 10/26/2016 5:07:10 PM By: Linton Ham MD Entered By: Linton Ham on 10/26/2016 12:35:56 Elizabeth [name] ([id number]) -------------------------------------------------------------------------------- [place] Details Patient Name: Elizabeth [name]. Date of Service: 10/26/2016 9:00 AM Medical Record Patient Account Number: [id number] Number: Treating RN: Afful, RN, BSN, Rita 07/07/1951 [phone] y.o. Other Clinician: Date of Birth/Sex: Female) Treating ROBSON,  MICHAEL Primary Care Physician/Extender: Mila Palmer, LINDA Physician: Referring Physician: MILES, LINDA Weeks in Treatment: 0 Active Inactive Abuse / Safety / Falls / Self Care Management Nursing Diagnoses: Impaired home maintenance Impaired physical mobility Self care deficit: actual or potential Goals: Patient will remain injury free Date Initiated: 10/26/2016 Goal Status: Active Patient/caregiver will verbalize understanding of skin care regimen Date Initiated: 10/26/2016 Goal Status: Active Patient/caregiver will verbalize/demonstrate measure taken to improve self care Date Initiated: 10/26/2016 Goal Status: Active Patient/caregiver will verbalize/demonstrate measures taken to improve the patient's personal safety Date Initiated: 10/26/2016 Goal Status: Active Patient/caregiver will verbalize/demonstrate measures taken to prevent injury and/or falls Date Initiated: 10/26/2016 Goal Status: Active Patient/caregiver will verbalize/demonstrate understanding of what to do in case of emergency Date Initiated: 10/26/2016 Goal Status: Active Interventions: Assess fall risk on admission and as needed Assess: immobility, friction, shearing, incontinence upon admission and as needed Assess impairment of mobility on admission and as needed per policy Assess self care needs on admission and as needed Sapia, Nirvana L. ([id number]) Patient referred to community resources (specify in notes) Provide education on basic hygiene Provide education on fall prevention Provide education on personal and home safety Provide education on safe transfers Notes: Orientation to the Wound Care Program Nursing Diagnoses: Knowledge deficit related to the wound healing center program Goals: Patient/caregiver will verbalize understanding of the [place] Program Date Initiated: 10/26/2016 Goal Status: Active Interventions:  Provide education on orientation to the wound  center Notes: Wound/Skin Impairment Nursing Diagnoses: Impaired tissue integrity Knowledge deficit related to ulceration/compromised skin integrity Goals: Patient/caregiver will verbalize understanding of skin care regimen Date Initiated: 10/26/2016 Goal Status: Active Ulcer/skin breakdown will have a volume reduction of 30% by week 4 Date Initiated: 10/26/2016 Goal Status: Active Ulcer/skin breakdown will have a volume reduction of 50% by week 8 Date Initiated: 10/26/2016 Goal Status: Active Ulcer/skin breakdown will have a volume reduction of 80% by week 12 Date Initiated: 10/26/2016 Goal Status: Active Ulcer/skin breakdown will heal within 14 weeks Date Initiated: 10/26/2016 Goal Status: Active TRISA, BYRLEY ([id number]) Interventions: Assess patient/caregiver ability to obtain necessary supplies Assess patient/caregiver ability to perform ulcer/skin care regimen upon admission and as needed Assess ulceration(s) every visit Provide education on ulcer and skin care Treatment Activities: Referred to DME Matheu Ploeger for dressing supplies : 10/26/2016 Skin care regimen initiated : 10/26/2016 Topical wound management initiated : 10/26/2016 Notes: Electronic Signature(s) Signed: 10/26/2016 4:26:19 PM By: Regan Lemming BSN, RN Entered By: Regan Lemming on 10/26/2016 09:28:53 Elizabeth [name] ([id number]) -------------------------------------------------------------------------------- Pain Assessment Details Patient Name: Elizabeth [name], Chaquana L. Date of Service: 10/26/2016 9:00 AM Medical Record Patient Account Number: [id number] Number: Treating RN: Afful, RN, BSN, Rita [birth year] [phone] y.o. Other Clinician: Date of Birth/Sex: Female) Treating ROBSON, MICHAEL Primary Care Physician/Extender: Mila Palmer, LINDA Physician: Referring Physician: MILES, LINDA Weeks in Treatment: 0 Active Problems Location of Pain Severity and Description of Pain Patient Has Paino No Site  Locations With Dressing Change: No Pain Management and Medication Current Pain Management: Electronic Signature(s) Signed: 10/26/2016 4:26:19 PM By: Regan Lemming BSN, RN Entered By: Regan Lemming on 10/26/2016 09:10:04 Elizabeth [name] ([id number]) -------------------------------------------------------------------------------- Wound Assessment Details Patient Name: Elizabeth [name], Sevilla L. Date of Service: 10/26/2016 9:00 AM Medical Record Patient Account Number: [id number] Number: Treating RN: Afful, RN, BSN, Rita 1951/03/27 [phone] y.o. Other Clinician: Date of Birth/Sex: Female) Treating ROBSON, MICHAEL Primary Care Physician/Extender: Mila Palmer, LINDA Physician: Referring Physician: MILES, LINDA Weeks in Treatment: 0 Wound Status Wound Number: 4 Primary Diabetic Wound/Ulcer of the Lower Etiology: Extremity Wound Location: Left Toe Great - Plantar Wound Open Wounding Event: Gradually Appeared Status: Date Acquired: 09/14/2016 Comorbid Asthma, Hypertension, Peripheral Weeks Of Treatment: 0 History: Venous Disease, Type II Diabetes, Clustered Wound: No Neuropathy, Seizure Disorder, Received Radiation Photos Photo Uploaded By: Regan Lemming on 10/26/2016 16:25:20 Wound Measurements Length: (cm) 0.7 Width: (cm) 0.5 Depth: (cm) 0.2 Area: (cm) 0.275 Volume: (cm) 0.055 % Reduction in Area: 0% % Reduction in Volume: 0% Epithelialization: None Tunneling: No Undermining: No Wound Description Classification: Grade 1 Wound Margin: Distinct, outline attached Exudate Amount: Medium Exudate Type: Sanguinous Exudate Color: red Foul Odor After Cleansing: No Wound Bed Burrous, Ibeth L. ([id number]) Granulation Amount: Small (1-33%) Exposed Structure Granulation Quality: Pink, Pale Fascia Exposed: No Necrotic Amount: Medium (34-66%) Fat Layer Exposed: No Necrotic Quality: Eschar, Adherent Slough Tendon Exposed: No Muscle Exposed: No Joint Exposed: No Bone Exposed:  No Limited to Skin Breakdown Periwound Skin Texture Texture Color No Abnormalities Noted: No No Abnormalities Noted: No Callus: No Atrophie Blanche: No Crepitus: No Cyanosis: No Excoriation: No Ecchymosis: No Fluctuance: No Erythema: No Friable: No Hemosiderin Staining: No Induration: No Mottled: No Localized Edema: No Pallor: No Rash: No Rubor: No Scarring: No Temperature / Pain Moisture Temperature: No Abnormality No Abnormalities Noted: No Dry / Scaly: Yes Maceration: No Moist: Yes Wound Preparation Ulcer Cleansing: Rinsed/Irrigated with Saline Topical Anesthetic Applied: Other: lidocaine 4%, Treatment  Notes Wound #4 (Left, Plantar Toe Great) 1. Cleansed with: Clean wound with Normal Saline 4. Dressing Applied: Aquacel Ag 5. Secondary Dressing Applied Dry Gauze Foam Kerlix/Conform 6. Footwear/Offloading device applied Other footwear/offloading device applied (specify in notes) 7. Secured with Tape Electronic Signature(s) FAITHFUL, BERGEN ([id number]) Signed: 10/26/2016 4:26:19 PM By: Regan Lemming BSN, RN Entered By: Regan Lemming on 10/26/2016 09:26:50 Elizabeth [name] ([id number]) -------------------------------------------------------------------------------- Vitals Details Patient Name: Elizabeth [name] L. Date of Service: 10/26/2016 9:00 AM Medical Record Patient Account Number: [id number] Number: Treating RN: Afful, RN, BSN, Rita [birth year] [phone] y.o. Other Clinician: Date of Birth/Sex: Female) Treating ROBSON, MICHAEL Primary Care Physician/Extender: Mila Palmer, LINDA Physician: Referring Physician: MILES, LINDA Weeks in Treatment: 0 Vital Signs Time Taken: 09:12 Temperature (F): 98.1 Height (in): 63 Pulse (bpm): 68 Source: Stated Respiratory Rate (breaths/min): 16 Weight (lbs): 191 Blood Pressure (mmHg): 113/60 Source: Measured Reference Range: 80 - 120 mg / dl Body Mass Index (BMI): 33.8 Electronic Signature(s) Signed:  10/26/2016 4:26:19 PM By: Regan Lemming BSN, RN Entered By: Regan Lemming on 10/26/2016 09:12:48

## 2016-10-27 NOTE — Progress Notes (Signed)
Elizabeth, [name] ([id number]) Visit Report for 10/26/2016 Abuse/Suicide Risk Screen Details Patient Name: Elizabeth [name], Elizabeth [name]. Date of Service: 10/26/2016 9:00 AM Medical Record Patient Account Number: [id number] Number: Treating RN: Afful, RN, BSN, Rita 03-Jun-1951 [phone] y.o. Other Clinician: Date of Birth/Sex: Female) Treating ROBSON, MICHAEL Primary Care Physician/Extender: Mila Palmer, LINDA Physician: Referring Physician: MILES, LINDA Weeks in Treatment: 0 Abuse/Suicide Risk Screen Items Answer ABUSE/SUICIDE RISK SCREEN: Has anyone close to you tried to hurt or harm you recentlyo No Do you feel uncomfortable with anyone in your familyo No Has anyone forced you do things that you didnot want to doo No Do you have any thoughts of harming yourselfo No Patient displays signs or symptoms of abuse and/or neglect. No Electronic Signature(s) Signed: 10/26/2016 4:26:19 PM By: Regan Lemming BSN, RN Entered By: Regan Lemming on 10/26/2016 09:10:56 Elizabeth [name] ([id number]) -------------------------------------------------------------------------------- Activities of Daily Living Details Patient Name: Elizabeth [name], Elizabeth [name]. Date of Service: 10/26/2016 9:00 AM Medical Record Patient Account Number: [id number] Number: Treating RN: Afful, RN, BSN, Rita February 06, 1951 [phone] y.o. Other Clinician: Date of Birth/Sex: Female) Treating ROBSON, MICHAEL Primary Care Physician/Extender: Mila Palmer, LINDA Physician: Referring Physician: MILES, LINDA Weeks in Treatment: 0 Activities of Daily Living Items Answer Activities of Daily Living (Please select one for each item) Drive Automobile Completely Able Take Medications Completely Able Use Telephone Completely Able Care for Appearance Completely Able Use Toilet Completely Able Bath / Shower Completely Able Dress Self Completely Able Feed Self Completely Able Walk Completely Able Get In / Out Bed Completely Able Housework Completely  Able Prepare Meals Completely Able Handle Money Completely Able Shop for Self Completely Able Electronic Signature(s) Signed: 10/26/2016 4:26:19 PM By: Regan Lemming BSN, RN Entered By: Regan Lemming on 10/26/2016 09:11:21 Elizabeth [name] ([id number]) -------------------------------------------------------------------------------- Education Assessment Details Patient Name: Elizabeth [name]. Date of Service: 10/26/2016 9:00 AM Medical Record Patient Account Number: [id number] Number: Treating RN: Afful, RN, BSN, Rita [birth year] [phone] y.o. Other Clinician: Date of Birth/Sex: Female) Treating ROBSON, MICHAEL Primary Care Physician/Extender: Mila Palmer, LINDA Physician: Referring Physician: Gerre Scull in Treatment: 0 Primary Learner Assessed: Patient Learning Preferences/Education Level/Primary Language Learning Preference: Explanation Highest Education Level: College or Above Preferred Language: English Cognitive Barrier Assessment/Beliefs Language Barrier: No Physical Barrier Assessment Impaired Vision: No Knowledge/Comprehension Assessment Knowledge Level: High Comprehension Level: High Ability to understand written High instructions: Ability to understand verbal High instructions: Motivation Assessment Anxiety Level: Calm Cooperation: Cooperative Education Importance: Acknowledges Need Interest in Health Problems: Asks Questions Perception: Coherent Willingness to Engage in Self- High Management Activities: Readiness to Engage in Self- High Management Activities: Electronic Signature(s) Signed: 10/26/2016 4:26:19 PM By: Regan Lemming BSN, RN Entered By: Regan Lemming on 10/26/2016 09:11:46 Elizabeth [name] ([id number]) -------------------------------------------------------------------------------- Fall Risk Assessment Details Patient Name: Elizabeth [name]. Date of Service: 10/26/2016 9:00 AM Medical Record Patient Account Number:  [id number] Number: Treating RN: Afful, RN, BSN, Rita 1950-12-16 [phone] y.o. Other Clinician: Date of Birth/Sex: Female) Treating ROBSON, MICHAEL Primary Care Physician/Extender: Mila Palmer, LINDA Physician: Referring Physician: MILES, LINDA Weeks in Treatment: 0 Fall Risk Assessment Items Have you had 2 or more falls in the last 12 monthso 0 No Have you had any fall that resulted in injury in the last 12 monthso 0 No FALL RISK ASSESSMENT: History of falling - immediate or within 3 months 0 No Secondary diagnosis 0 No Ambulatory aid None/bed rest/wheelchair/nurse 0 Yes Crutches/cane/walker 0 No Furniture 0 No IV Access/Saline Lock 0 No Gait/Training Normal/bed  rest/immobile 0 Yes Weak 10 Yes Impaired 0 No Mental Status Oriented to own ability 0 Yes Electronic Signature(s) Signed: 10/26/2016 4:26:19 PM By: Regan Lemming BSN, RN Entered By: Regan Lemming on 10/26/2016 09:11:58 Elizabeth [name] ([id number]) -------------------------------------------------------------------------------- Foot Assessment Details Patient Name: Elizabeth [name], Morena [name]. Date of Service: 10/26/2016 9:00 AM Medical Record Patient Account Number: [id number] Number: Treating RN: Afful, RN, BSN, Rita 1951-08-31 [phone] y.o. Other Clinician: Date of Birth/Sex: Female) Treating ROBSON, MICHAEL Primary Care Physician/Extender: Mila Palmer, LINDA Physician: Referring Physician: MILES, LINDA Weeks in Treatment: 0 Foot Assessment Items Site Locations + = Sensation present, - = Sensation absent, C = Callus, U = Ulcer R = Redness, W = Warmth, M = Maceration, PU = Pre-ulcerative lesion F = Fissure, S = Swelling, D = Dryness Assessment Right: Left: Other Deformity: No No Prior Foot Ulcer: No No Prior Amputation: No No Charcot Joint: No No Ambulatory Status: Ambulatory Without Help Gait: Steady Electronic Signature(s) Signed: 10/26/2016 4:26:19 PM By: Regan Lemming BSN, RN Entered By: Regan Lemming on  10/26/2016 09:12:13 Elizabeth [name] ([id number]) Elizabeth [name], Elizabeth [name] ([id number]) -------------------------------------------------------------------------------- Nutrition Risk Assessment Details Patient Name: Elizabeth [name], Kemora [name]. Date of Service: 10/26/2016 9:00 AM Medical Record Patient Account Number: [id number] Number: Treating RN: Afful, RN, BSN, Rita 1951/03/21 [phone] y.o. Other Clinician: Date of Birth/Sex: Female) Treating ROBSON, MICHAEL Primary Care Physician/Extender: Mila Palmer, LINDA Physician: Referring Physician: MILES, LINDA Weeks in Treatment: 0 Height (in): 63 Weight (lbs): 282 Body Mass Index (BMI): 49.9 Nutrition Risk Assessment Items NUTRITION RISK SCREEN: I have an illness or condition that made me change the kind and/or 0 No amount of food I eat I eat fewer than two meals per day 0 No I eat few fruits and vegetables, or milk products 0 No I have three or more drinks of beer, liquor or wine almost every day 0 No I have tooth or mouth problems that make it hard for me to eat 0 No I don't always have enough money to buy the food I need 0 No I eat alone most of the time 0 No I take three or more different prescribed or over-the-counter drugs a 0 No day Without wanting to, I have lost or gained 10 pounds in the last six 0 No months I am not always physically able to shop, cook and/or feed myself 0 No Nutrition Protocols Good Risk Protocol 0 No interventions needed Moderate Risk Protocol Electronic Signature(s) Signed: 10/26/2016 4:26:19 PM By: Regan Lemming BSN, RN Entered By: Regan Lemming on 10/26/2016 09:12:04

## 2016-10-27 NOTE — Progress Notes (Addendum)
Elizabeth, [name] ([id number]) Visit Report for 10/26/2016 Chief Complaint Document Details Patient Name: PORTA, [name] Date of Service: 10/26/2016 9:00 AM Medical Record Patient Account Number: [id number] Number: Treating RN: Afful, RN, BSN, Rita August 30, 1951 [phone] y.o. Other Clinician: Date of Birth/Sex: Female) Treating Lucrezia Dehne Primary Care Physician/Extender: Mila Palmer, LINDA Physician: Referring Physician: MILES, LINDA Weeks in Treatment: 0 Information Obtained from: Patient Chief Complaint Bilateral lower extremity phlebolymphedema, ulcerations, and cellulitis. 10/26/16. Patient is here today for reviewable wound on her left plantar first toe Electronic Signature(s) Signed: 10/26/2016 5:07:10 PM By: Linton Ham MD Entered By: Linton Ham on 10/26/2016 12:37:12 [name] ([id number]) -------------------------------------------------------------------------------- Debridement Details Patient Name: Elizabeth [name], [name] Date of Service: 10/26/2016 9:00 AM Medical Record Patient Account Number: [id number] Number: Treating RN: Afful, RN, BSN, Rita Mar 31, 1951 [phone] y.o. Other Clinician: Date of Birth/Sex: Female) Treating Javaria Knapke Primary Care Physician/Extender: Mila Palmer, LINDA Physician: Referring Physician: MILES, LINDA Weeks in Treatment: 0 Debridement Performed for Wound #4 Left,Plantar Toe Great Assessment: Performed By: Physician Ricard Dillon, MD Debridement: Debridement Pre-procedure Yes - 09:32 Verification/Time Out Taken: Start Time: 09:32 Pain Control: Lidocaine 4% Topical Solution Level: Skin/Subcutaneous Tissue Total Area Debrided (L x 0.7 (cm) x 0.5 (cm) = 0.35 (cm) W): Tissue and other Non-Viable, Eschar, Fibrin/Slough, Subcutaneous material debrided: Instrument: Curette Bleeding: Minimum Hemostasis Achieved: Pressure End Time: 09:35 Procedural Pain: 0 Post Procedural Pain: 0 Response to  Treatment: Procedure was tolerated well Post Debridement Measurements of Total Wound Length: (cm) 0.7 Width: (cm) 0.5 Depth: (cm) 0.3 Volume: (cm) 0.082 Character of Wound/Ulcer Post Stable Debridement: Severity of Tissue Post Debridement: Fat layer exposed Post Procedure Diagnosis Same as Pre-procedure Electronic Signature(s) Signed: 10/26/2016 4:26:19 PM By: Regan Lemming BSN, RN [name] ([id number]) Signed: 10/26/2016 5:07:10 PM By: Linton Ham MD Entered By: Linton Ham on 10/26/2016 12:36:17 Powless, [name] ([id number]) -------------------------------------------------------------------------------- HPI Details Patient Name: Elizabeth [name], [name] Date of Service: 10/26/2016 9:00 AM Medical Record Patient Account Number: [id number] Number: Treating RN: Afful, RN, BSN, Rita [birth year] [phone] y.o. Other Clinician: Date of Birth/Sex: Female) Treating Cecilee Rosner Primary Care Physician/Extender: Mila Palmer, LINDA Physician: Referring Physician: Delight Stare Weeks in Treatment: 0 History of Present Illness HPI Description: Pleasant 65 year old with past medical history significant for diabetes (hemoglobin A1c 6.1 in February 2016), chronic venous stasis disease, and breast cancer. She developed bilateral lower extremity ulcerations and cellulitis and was hospitalized at [facility] in early March 2016. Ultrasound showed no evidence for DVT. Treated with IV vancomycin. Discharged on 01/09/2015 on Bactrim and in Unna boots, which she tolerated well. She is ambulating per her baseline. No significant pain. No claudication or rest pain. ABI noncompressible. Palpable DP bilaterally. She does not usually wear compression. Cannot apply compression stockings. No fever or chills. No significant drainage. Blood sugars less than 150. 01/28/2015 -- she has not yet received her appointment for a vascular studies both arterial and  venous duplex. He is otherwise doing well but has noticed some redness of the right lower extremity in an area where it is no open ulcer. The left lower extremity feels better. 02/04/2015 -- she has had her vascular studies done in [place] and she goes back to see the doctor tomorrow. I have asked her to get business card to them so that they can send [place] a report. she is not very compliant with her now for dressing and if it sits down she does not use a call to  come and get it fixed. Her hygiene too seems to be rather poor. 02/11/2015 -- we have got the vascular studies done at [facility] and the lower extremity arterial duplex evaluation done on 01/30/2015 revealed there was diffuse vessel wall calcification noted throughout the bilateral lower extremity arterial system there is possible arterial inflow disease. No hemodynamically significant stenosis is present. Bilateral calf arteries were not well visualized. the lower extremity venous duplex study done on 01/30/2015 revealed that the patient had no evidence of DVT on both extremities. There was deep venous reflux involving the left lower extremity. No incompetent bilateral great saphenous veins and right small saphenous vein. She was then seen by Dr. Scot Dock on 02/05/2015. His opinion was that she had adequate arterial flow although there was some evidenc e of calcified disease. However because she had a biphasic Doppler signal in the dorsalis pedis position he did not think it was necessary to do any further arterial workup. He thought it was safe to use mild compression stockings and elevate her legs. He has written her a prescription for knee-high compression stockings with a gradient of 15-20 mmHg and is encouraged her to wear these. 02/18/2015 is finally got her prescription filled and has brought her stockings of 15-20 mmHg and come ready to use these. [name], [name] ([id number]) 10/26/16; this is a 65 year old  woman with type 2 diabetes and according to her a history of peripheral neuropathy. Over the last 6 weeks she has noted a wound on the plantar aspect of her left first toe. There is no obvious precipitating features. She apparently does not have a history of PAD and has had that reviewed previously by vein and vascular in [place]. Her ABI in this clinic however was 1.55. She is been going to her primary physician's office and is been getting DuoDERM which is the primary dressing. She was also on oral antibiotics although I don't think that this was cultured. He tells me she did have a history of wounds previously but is not sure where on her feet. She is wearing ordinary footwear. [facility] she had arterial studies done in March 2016. At that time her ABIs were noncompressible in both legs however Doppler waveforms were multiphasic bilaterally. I do not see that she was actually seen by the physician. She was noted to have diffuse specimen calcification throughout the bilateral lower extremity arterial system. No hemodynamically significant stenosis was present. Bilateral calf arteries were not well visualized which was most likely due to calcifications. The patient also has chronic lower extremity edema. Studies of this in March 2016 showed no evidence of a DVT. As deep vein reflux involving the left lower extremity common femoral, femoral and greater saphenous vein. There was reflux on the right involving the great and small saphenous vein I note that she was seen in this clinic previously I think this was for wounds on her legs. She was discharged in compression which she tells me she still wears for chronic venous insufficiency and lymphedema. She also has a history of breast cancer, venous reflux and lymphedema Electronic Signature(s) Signed: 10/26/2016 5:07:10 PM By: Linton Ham MD Entered By: Linton Ham on 10/26/2016 12:43:33 [name]  ([id number]) -------------------------------------------------------------------------------- Physical Exam Details Patient Name: Duch, Chane L. Date of Service: 10/26/2016 9:00 AM Medical Record Patient Account Number: [id number] Number: Treating RN: Afful, RN, BSN, Rita November 20, 1950 [phone] y.o. Other Clinician: Date of Birth/Sex: Female) Treating Colburn Asper Primary Care Physician/Extender: Mila Palmer, LINDA  Physician: Referring Physician: Delight Stare Weeks in Treatment: 0 Constitutional Sitting or standing Blood Pressure is within target range for patient.. Pulse regular and within target range for patient.[name] Respirations regular, non-labored and within target range.. Temperature is normal and within the target range for the patient.. Patient's appearance is neat and clean. Appears in no acute distress. Well nourished and well developed.. Eyes Conjunctivae clear. No discharge.[name] Respiratory Respiratory effort is easy and symmetric bilaterally. Rate is normal at rest and on room air.. Bilateral breath sounds are clear and equal in all lobes with no wheezes, rales or rhonchi.. Cardiovascular Heart rhythm and rate regular, without murmur or gallop.. Reduced but palpable femoral. Pedal pulses are palpable bilaterally. Edema present in both extremities. This is nonpitting and I think is mostly lymphedema. Probably some venous insufficiency as well with inflammation on the right lower leg. Lymphatic None palpable in the popliteal or inguinal areas. Integumentary (Hair, Skin) No rash was seen. Neurological In spite of her complaints of neuropathy she had normal vibration and light touch. Psychiatric No evidence of depression, anxiety, or agitation. Calm, cooperative, and communicative. Appropriate interactions and affect.. Notes Wound exam; small vertical wound on the plantar left great toe. This required debridement with a #3 curet to remove surface slough and nonviable  subcutaneous tissue. After debridement the base of this cleans up quite nicely and appears to have healthy granulation. There is no evidence of surrounding infection. Although her history is worrisome for PAD her pulses are palpable papillary refill times seems intact her forefoot was warm Electronic Signature(s) Signed: 10/26/2016 5:07:10 PM By: Linton Ham MD Entered By: Linton Ham on 10/26/2016 12:50:04 [name] ([id number]) [name] ([id number]) -------------------------------------------------------------------------------- Physician Orders Details Patient Name: Elizabeth [name], Francella L. Date of Service: 10/26/2016 9:00 AM Medical Record Patient Account Number: [id number] Number: Treating RN: Afful, RN, BSN, Rita 10-28-1951 [phone] y.o. Other Clinician: Date of Birth/Sex: Female) Treating Lasharn Bufkin Primary Care Physician/Extender: Mila Palmer, LINDA Physician: Referring Physician: MILES, LINDA Weeks in Treatment: 0 Verbal / Phone Orders: Yes Clinician: Afful, RN, BSN, Rita Read Back and Verified: Yes Diagnosis Coding Wound Cleansing Wound #4 Left,Plantar Toe Great o Cleanse wound with mild soap and water Anesthetic Wound #4 Left,Plantar Toe Great o Topical Lidocaine 4% cream applied to wound bed prior to debridement Primary Wound Dressing Wound #4 Left,Plantar Toe Great o Aquacel Ag Secondary Dressing Wound #4 Left,Plantar Toe Great o Gauze and Kerlix/Conform o Foam Dressing Change Frequency Wound #4 Left,Plantar Toe Great o Change dressing every other day. Follow-up Appointments Wound #4 Left,Plantar Toe Great o Return Appointment in 1 week. Edema Control Wound #4 Left,Plantar Toe Great o Patient to wear own compression stockings o Elevate legs to the level of the heart and pump ankles as often as possible Off-Loading Wound #4 Left,Plantar Toe Great Labarre, Aleira L. ([id number]) o Open toe surgical shoe with  peg assist. Additional Orders / Instructions Wound #4 Left,Plantar Toe Great o Increase protein intake. o Activity as tolerated Electronic Signature(s) Signed: 10/26/2016 4:26:19 PM By: Regan Lemming BSN, RN Signed: 10/26/2016 5:07:10 PM By: Linton Ham MD Entered By: Regan Lemming on 10/26/2016 09:37:56 [name] ([id number]) -------------------------------------------------------------------------------- Problem List Details Patient Name: Wachter, Melat L. Date of Service: 10/26/2016 9:00 AM Medical Record Patient Account Number: [id number] Number: Treating RN: Afful, RN, BSN, Rita 09-25-1951 [phone] y.o. Other Clinician: Date of Birth/Sex: Female) Treating Doryan Bahl Primary Care Physician/Extender: Mila Palmer, LINDA Physician: Referring Physician: MILES, LINDA Weeks in Treatment: 0 Active  Problems ICD-10 Encounter Code Description Active Date Diagnosis E11.621 Type 2 diabetes mellitus with foot ulcer 10/26/2016 Yes L97.522 Non-pressure chronic ulcer of other part of left foot with fat 10/26/2016 Yes layer exposed E11.42 Type 2 diabetes mellitus with diabetic polyneuropathy 10/26/2016 Yes Inactive Problems Resolved Problems Electronic Signature(s) Signed: 10/26/2016 5:07:10 PM By: Linton Ham MD Entered By: Linton Ham on 10/26/2016 12:35:39 [name] ([id number]) -------------------------------------------------------------------------------- Progress Note Details Patient Name: Elizabeth [name], Abby L. Date of Service: 10/26/2016 9:00 AM Medical Record Patient Account Number: [id number] Number: Treating RN: Afful, RN, BSN, Rita May 23, 1951 [phone] y.o. Other Clinician: Date of Birth/Sex: Female) Treating Roza Creamer Primary Care Physician/Extender: Mila Palmer, LINDA Physician: Referring Physician: MILES, LINDA Weeks in Treatment: 0 Subjective Chief Complaint Information obtained from Patient Bilateral lower extremity  phlebolymphedema, ulcerations, and cellulitis. 10/26/16. Patient is here today for reviewable wound on her left plantar first toe History of Present Illness (HPI) Pleasant 65 year old with past medical history significant for diabetes (hemoglobin A1c 6.1 in February 2016), chronic venous stasis disease, and breast cancer. She developed bilateral lower extremity ulcerations and cellulitis and was hospitalized at [facility] in early March 2016. Ultrasound showed no evidence for DVT. Treated with IV vancomycin. Discharged on 01/09/2015 on Bactrim and in Unna boots, which she tolerated well. She is ambulating per her baseline. No significant pain. No claudication or rest pain. ABI noncompressible. Palpable DP bilaterally. She does not usually wear compression. Cannot apply compression stockings. No fever or chills. No significant drainage. Blood sugars less than 150. 01/28/2015 -- she has not yet received her appointment for a vascular studies both arterial and venous duplex. He is otherwise doing well but has noticed some redness of the right lower extremity in an area where it is no open ulcer. The left lower extremity feels better. 02/04/2015 -- she has had her vascular studies done in [place] and she goes back to see the doctor tomorrow. I have asked her to get business card to them so that they can send [place] a report. she is not very compliant with her now for dressing and if it sits down she does not use a call to come and get it fixed. Her hygiene too seems to be rather poor. 02/11/2015 -- we have got the vascular studies done at [facility] and the lower extremity arterial duplex evaluation done on 01/30/2015 revealed there was diffuse vessel wall calcification noted throughout the bilateral lower extremity arterial system there is possible arterial inflow disease. No hemodynamically significant stenosis is present. Bilateral calf arteries were not well  visualized. the lower extremity venous duplex study done on 01/30/2015 revealed that the patient had no evidence of DVT on both extremities. There was deep venous reflux involving the left lower extremity. No incompetent bilateral great saphenous veins and right small saphenous vein. She was then seen by Dr. Scot Dock on 02/05/2015. His opinion was that she had adequate arterial flow although there was some evidenc e of calcified disease. However because she had a biphasic Doppler signal in the dorsalis pedis position he did not think it was necessary to do any further arterial workup. He thought it was safe to use mild compression stockings and elevate her legs. He has written her a [place], Roylene L. ([id number]) prescription for knee-high compression stockings with a gradient of 15-20 mmHg and is encouraged her to wear these. 02/18/2015 is finally got her prescription filled and has brought her stockings of 15-20 mmHg and come ready to use these. READMISSION  10/26/16; this is a 65 year old woman with type 2 diabetes and according to her a history of peripheral neuropathy. Over the last 6 weeks she has noted a wound on the plantar aspect of her left first toe. There is no obvious precipitating features. She apparently does not have a history of PAD and has had that reviewed previously by vein and vascular in [place]. Her ABI in this clinic however was 1.55. She is been going to her primary physician's office and is been getting DuoDERM which is the primary dressing. She was also on oral antibiotics although I don't think that this was cultured. He tells me she did have a history of wounds previously but is not sure where on her feet. She is wearing ordinary footwear. looking [facility] she had arterial studies done in March 2016. At that time her ABIs were noncompressible in both legs however Doppler waveforms were multiphasic bilaterally. I do not see that she was actually  seen by the physician. She was noted to have diffuse specimen calcification throughout the bilateral lower extremity arterial system. No hemodynamically significant stenosis was present. Bilateral calf arteries were not well visualized which was most likely due to calcifications. The patient also has chronic lower extremity edema. Studies of this in March 2016 showed no evidence of a DVT. As deep vein reflux involving the left lower extremity common femoral, femoral and greater saphenous vein. There was reflux on the right involving the great and small saphenous vein I note that she was seen in this clinic previously I think this was for wounds on her legs. She was discharged in compression which she tells me she still wears for chronic venous insufficiency and lymphedema. She also has a history of breast cancer, venous reflux and lymphedema Wound History Patient presents with 1 open wound that has been present for approximately 6 weeks. Patient has been treating wound in the following manner: duoderm. Laboratory tests have not been performed in the last month. Patient reportedly has not tested positive for an antibiotic resistant organism. Patient reportedly has not tested positive for osteomyelitis. Patient reportedly has not had testing performed to evaluate circulation in the legs. Patient experiences the following problems associated with their wounds: infection, swelling. Patient History Information obtained from Patient. Allergies No Known Allergies Family History Cancer - Mother, Diabetes - Mother, Siblings, No family history of Heart Disease, Hereditary Spherocytosis, Hypertension, Kidney Disease, Lung Disease, Seizures, Stroke, Thyroid Problems, Tuberculosis. Social History Never smoker, Marital Status - Married, Alcohol Use - Never, Drug Use - No History, Caffeine Use - Daily. APOLONIA, MCCLENAHAN ([id number]) Medical History Eyes Denies history of Cataracts, Glaucoma, Optic  Neuritis Ear/Nose/Mouth/Throat Denies history of Chronic sinus problems/congestion, Middle ear problems Hematologic/Lymphatic Denies history of Anemia, Hemophilia, Human Immunodeficiency Virus, Lymphedema, Sickle Cell Disease Respiratory Patient has history of Asthma - as a child Denies history of Aspiration, Chronic Obstructive Pulmonary Disease (COPD), Pneumothorax, Sleep Apnea, Tuberculosis Cardiovascular Patient has history of Hypertension, Peripheral Venous Disease - venous stasis Denies history of Angina, Arrhythmia, Congestive Heart Failure, Coronary Artery Disease, Deep Vein Thrombosis, Hypotension, Myocardial Infarction, Peripheral Arterial Disease, Phlebitis, Vasculitis Gastrointestinal Denies history of Cirrhosis , Colitis, Crohn s, Hepatitis A, Hepatitis B, Hepatitis C Endocrine Patient has history of Type II Diabetes Denies history of Type I Diabetes Genitourinary Denies history of End Stage Renal Disease Immunological Denies history of Lupus Erythematosus, Raynaud s, Scleroderma Integumentary (Skin) Denies history of History of Burn, History of pressure wounds Musculoskeletal Denies history of Gout,  Rheumatoid Arthritis, Osteoarthritis, Osteomyelitis Neurologic Patient has history of Neuropathy, Seizure Disorder - years ago Denies history of Dementia, Quadriplegia, Paraplegia Oncologic Patient has history of Received Radiation - breast cancer - right side Denies history of Received Chemotherapy Psychiatric Denies history of Anorexia/bulimia, Confinement Anxiety Patient is treated with Oral Agents. Blood sugar is tested. Blood sugar results noted at the following times: Breakfast - 128. Hospitalization/Surgery History - 01/06/2015, [facility], cellulitis and UTI. 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 ([id number]) The patient has no complaints or  symptoms. Ear/Nose/Mouth/Throat The patient has no complaints or symptoms. Hematologic/Lymphatic The patient has no complaints or symptoms. Respiratory The patient has no complaints or symptoms. Cardiovascular Complains or has symptoms of LE edema. Gastrointestinal The patient has no complaints or symptoms. Endocrine The patient has no complaints or symptoms. Genitourinary The patient has no complaints or symptoms. Immunological The patient has no complaints or symptoms. Integumentary (Skin) Complains or has symptoms of Wounds, Breakdown, Swelling. Neurologic The patient has no complaints or symptoms. Oncologic The patient has no complaints or symptoms. Psychiatric The patient has no complaints or symptoms. Objective Constitutional Sitting or standing Blood Pressure is within target range for patient.. Pulse regular and within target range for patient.[name] Respirations regular, non-labored and within target range.. Temperature is normal and within the target range for the patient.. Patient's appearance is neat and clean. Appears in no acute distress. Well nourished and well developed.. Vitals Time Taken: 9:12 AM, Height: 63 in, Source: Stated, Weight: 191 lbs, Source: Measured, BMI: 33.8, Temperature: 98.1 F, Pulse: 68 bpm, Respiratory Rate: 16 breaths/min, Blood Pressure: 113/60 mmHg. Eyes Conjunctivae clear. No discharge.[name] Respiratory Respiratory effort is easy and symmetric bilaterally. Rate is normal at rest and on room air.. Bilateral breath sounds are clear and equal in all lobes with no wheezes, rales or rhonchi.Elizabeth [name], [name] ([id number]) Cardiovascular Heart rhythm and rate regular, without murmur or gallop.. Reduced but palpable femoral. Pedal pulses are palpable bilaterally. Edema present in both extremities. This is nonpitting and I think is mostly lymphedema. Probably some venous insufficiency as well with inflammation on the right lower  leg. Lymphatic None palpable in the popliteal or inguinal areas. Neurological In spite of her complaints of neuropathy she had normal vibration and light touch. Psychiatric No evidence of depression, anxiety, or agitation. Calm, cooperative, and communicative. Appropriate interactions and affect.. General Notes: Wound exam; small vertical wound on the plantar left great toe. This required debridement with a #3 curet to remove surface slough and nonviable subcutaneous tissue. After debridement the base of this cleans up quite nicely and appears to have healthy granulation. There is no evidence of surrounding infection. Although her history is worrisome for PAD her pulses are palpable papillary refill times seems intact her forefoot was warm Integumentary (Hair, Skin) No rash was seen. Wound #4 status is Open. Original cause of wound was Gradually Appeared. The wound is located on the [employer]. The wound measures 0.7cm length x 0.5cm width x 0.2cm depth; 0.275cm^2 area and 0.055cm^3 volume. The wound is limited to skin breakdown. There is no tunneling or undermining noted. There is a medium amount of sanguinous drainage noted. The wound margin is distinct with the outline attached to the wound base. There is small (1-33%) pink, pale granulation within the wound bed. There is a medium (34-66%) amount of necrotic tissue within the wound bed including Eschar and Adherent  Slough. The periwound skin appearance exhibited: Dry/Scaly, Moist. The periwound skin appearance did not exhibit: Callus, Crepitus, Excoriation, Fluctuance, Friable, Induration, Localized Edema, Rash, Scarring, Maceration, Atrophie Blanche, Cyanosis, Ecchymosis, Hemosiderin Staining, Mottled, Pallor, Rubor, Erythema. Periwound temperature was noted as No Abnormality. 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 ([id number]) Procedures Wound #4 Wound #4 is a Diabetic Wound/Ulcer of the Lower Extremity located on the Left,Plantar Toe Great . There was a Skin/Subcutaneous Tissue Debridement [id number]) debridement with total area of 0.35 sq cm performed by Ricard Dillon, MD. with the following instrument(s): Curette to remove Non-Viable tissue/material including Fibrin/Slough, Eschar, and Subcutaneous after achieving pain control using Lidocaine 4% Topical Solution. A time out was conducted at 09:32, prior to the start of the procedure. A Minimum amount of bleeding was controlled with Pressure. The procedure was tolerated well with a pain level of 0 throughout and a pain level of 0 following the procedure. Post Debridement Measurements: 0.7cm length x 0.5cm width x 0.3cm depth; 0.082cm^3 volume. Character of Wound/Ulcer Post Debridement is stable. Severity of Tissue Post Debridement is: Fat layer exposed. Post procedure Diagnosis Wound #4: Same as Pre-Procedure Plan Wound Cleansing: Wound #4 Left,Plantar Toe Great: Cleanse wound with mild soap and water Anesthetic: Wound #4 Left,Plantar Toe Great: Topical Lidocaine 4% cream applied to wound bed prior to debridement Primary Wound Dressing: Wound #4 Left,Plantar Toe Great: Aquacel Ag Secondary Dressing: Wound #4 Left,Plantar Toe Great: Gauze and Kerlix/Conform Foam Dressing Change Frequency: Wound #4 Left,Plantar Toe Great: Change dressing every other day. Follow-up Appointments: Wound #4 Left,Plantar Toe Great: Return Appointment in 1 week. Edema Control: Wound #4 Left,Plantar Toe Great: Patient to wear own compression stockings Elevate legs to the level of the heart and pump ankles as often as possible Off-Loading: Wound #4 Left,Plantar Toe Great: Madrazo, Pakou L. ([id number]) Open toe surgical shoe with peg  assist. Additional Orders / Instructions: Wound #4 Left,Plantar Toe Great: Increase protein intake. Activity as tolerated Aquacel AG to the wound on the left plantar first toe kerlix and conform peg assit shoe for offloading at this point no further tests. May need xray and vasc strudoes (repeat) if this does not close Engineer, maintenance) Signed: 10/27/2016 11:49:26 AM By: Gretta Cool RN, BSN, Kim RN, BSN Signed: 10/27/2016 5:40:41 PM By: Linton Ham MD Previous Signature: 10/26/2016 5:07:10 PM Version By: Linton Ham MD Entered By: Gretta Cool RN, BSN, Kim on 10/27/2016 11:49:26 [name] ([id number]) -------------------------------------------------------------------------------- ROS/PFSH Details Patient Name: Travelstead, Sonda L. Date of Service: 10/26/2016 9:00 AM Medical Record Patient Account Number: [id number] Number: Treating RN: Afful, RN, BSN, Rita Mar 24, 1951 [phone] y.o. Other Clinician: Date of Birth/Sex: Female) Treating Gillie Crisci Primary Care Physician/Extender: Mila Palmer, LINDA Physician: Referring Physician: MILES, LINDA Weeks in Treatment: 0 Label Progress Note Print Version as History and Physical for this encounter Information Obtained From Patient Wound History Do you currently have one or more open woundso Yes How many open wounds do you currently haveo 1 Approximately how long have you had your woundso 6 weeks How have you been treating your wound(s) until nowo duoderm Has your wound(s) ever healed and then re-openedo No Have you had any lab work done in the past montho No Have you tested positive for an antibiotic resistant organism (MRSA, VRE)o No Have you tested positive for  osteomyelitis (bone infection)o No Have you had any tests for circulation on your legso No Have you had other problems associated with your woundso Infection, Swelling Cardiovascular Complaints and Symptoms: Positive for: LE edema Medical History: Positive  for: Hypertension; Peripheral Venous Disease - venous stasis Negative for: Angina; Arrhythmia; Congestive Heart Failure; Coronary Artery Disease; Deep Vein Thrombosis; Hypotension; Myocardial Infarction; Peripheral Arterial Disease; Phlebitis; Vasculitis Integumentary (Skin) Complaints and Symptoms: Positive for: Wounds; Breakdown; Swelling Medical History: Negative for: History of Burn; History of pressure wounds Eyes Complaints and Symptoms: No Complaints or Symptoms Medical HistoryTYTEANA, NORFLEET. ([id number]) Negative for: Cataracts; Glaucoma; Optic Neuritis Past Medical History Notes: left eye blind - detached retina Ear/Nose/Mouth/Throat Complaints and Symptoms: No Complaints or Symptoms Medical History: Negative for: Chronic sinus problems/congestion; Middle ear problems Hematologic/Lymphatic Complaints and Symptoms: No Complaints or Symptoms Medical History: Negative for: Anemia; Hemophilia; Human Immunodeficiency Virus; Lymphedema; Sickle Cell Disease Respiratory Complaints and Symptoms: No Complaints or Symptoms Medical History: Positive for: Asthma - as a child Negative for: Aspiration; Chronic Obstructive Pulmonary Disease (COPD); Pneumothorax; Sleep Apnea; Tuberculosis Gastrointestinal Complaints and Symptoms: No Complaints or Symptoms Medical History: Negative for: Cirrhosis ; Colitis; Crohnos; Hepatitis A; Hepatitis B; Hepatitis C Endocrine Complaints and Symptoms: No Complaints or Symptoms Medical History: Positive for: Type II Diabetes Negative for: Type I Diabetes Time with diabetes: 20 years Treated with: Oral agents Blood sugar tested every day: Yes Tested : once a day Blood sugar testing results: Hulon, Iridian L. ([id number]) Breakfast: 128 Genitourinary Complaints and Symptoms: No Complaints or Symptoms Medical History: Negative for: End Stage Renal Disease Immunological Complaints and Symptoms: No Complaints or Symptoms Medical  History: Negative for: Lupus Erythematosus; Raynaudos; Scleroderma Musculoskeletal Medical History: Negative for: Gout; Rheumatoid Arthritis; Osteoarthritis; Osteomyelitis Neurologic Complaints and Symptoms: No Complaints or Symptoms Medical History: Positive for: Neuropathy; Seizure Disorder - years ago Negative for: Dementia; Quadriplegia; Paraplegia Past Medical History Notes: CVA in 2000 Oncologic Complaints and Symptoms: No Complaints or Symptoms Medical History: Positive for: Received Radiation - breast cancer - right side Negative for: Received Chemotherapy Psychiatric Complaints and Symptoms: No Complaints or Symptoms Medical History: Negative for: Anorexia/bulimia; Confinement Anxiety Hospitalization / Surgery History REIZEL, ELSBERRY ([id number]) Name of Hospital Purpose of Hospitalization/Surgery Date [facility] cellulitis and UTI 01/06/2015 Family and Social History Cancer: Yes - Mother; Diabetes: Yes - Mother, Siblings; Heart Disease: No; Hereditary Spherocytosis: No; Hypertension: No; Kidney Disease: No; Lung Disease: No; Seizures: No; Stroke: No; Thyroid Problems: No; Tuberculosis: No; Never smoker; Marital Status - Married; Alcohol Use: Never; Drug Use: No History; Caffeine Use: Daily; Financial Concerns: No; Food, Clothing or Shelter Needs: No; Support System Lacking: No; Transportation Concerns: No; Advanced Directives: No; Patient does not want information on Administrator) Signed: 10/26/2016 4:26:19 PM By: Regan Lemming BSN, RN Signed: 10/26/2016 5:07:10 PM By: Linton Ham MD Entered By: Regan Lemming on 10/26/2016 09:25:57 [name] ([id number]) -------------------------------------------------------------------------------- SuperBill Details Patient Name: Elizabeth [name], Shandiin L. Date of Service: 10/26/2016 Medical Record Patient Account Number: [id number] Number: Treating RN: Baruch Gouty, RN, BSN, Rita 07-Mar-1951 [phone]  y.o. 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 Facility Procedures CPT4 Code Description: [id number] - WOUND CARE VISIT-LEV  3 EST PT Modifier: Quantity: 1 CPT4 Code Description: [id number] - DEB SUBQ TISSUE 20 SQ CM/< ICD-10 Description Diagnosis L97.522 Non-pressure chronic ulcer of other part of left foot E11.621 Type 2 diabetes mellitus with foot ulcer Modifier: with fat lay Quantity: 1 er exposed Physician Procedures CPT4 Code Description: [id number] - WC PHYS LEVEL 4 - EST PT ICD-10 Description Diagnosis E11.621 Type 2 diabetes mellitus with foot ulcer L97.522 Non-pressure chronic ulcer of other part of left foot Modifier: 25 with fat laye Quantity: 1 r exposed CPT4 Code Description: [id number] - WC PHYS SUBQ TISS 20 SQ CM ICD-10 Description Diagnosis L97.522 Non-pressure chronic ulcer of other part of left foot E11.621 Type 2 diabetes mellitus with foot ulcer Modifier: with fat laye Quantity: 1 r exposed Electronic Signature(s) BRANY, LINSEY ([id number]) Signed: 10/26/2016 5:07:10 PM By: Linton Ham MD Entered By: Linton Ham on 10/26/2016 12:51:12

## 2016-11-02 ENCOUNTER — Encounter: Payer: Medicare HMO | Admitting: Internal Medicine

## 2016-11-02 DIAGNOSIS — E11621 Type 2 diabetes mellitus with foot ulcer: Secondary | ICD-10-CM | POA: Diagnosis not present

## 2016-11-02 NOTE — Progress Notes (Signed)
[name], [name] ([id number]) Visit Report for 11/02/2016 Chief Complaint Document Details Patient Name: [name], [name] Date of Service: 11/02/2016 10:15 AM Medical Record Patient Account Number: [id number] Number: Treating RN: Baruch Gouty, RN, BSN, Rita [birth year] [phone] y.o. Other Clinician: Date of Birth/Sex: Female) Treating Emilia Kayes Primary Care Physician/Extender: Mila Palmer, LINDA Physician: Referring Physician: MILES, LINDA Weeks in Treatment: 1 Information Obtained from: Patient Chief Complaint Bilateral lower extremity phlebolymphedema, ulcerations, and cellulitis. 10/26/16. Patient is here today for reviewable wound on her left plantar first toe Electronic Signature(s) Signed: 11/02/2016 3:32:00 PM By: Linton Ham MD Entered By: Linton Ham on 11/02/2016 12:19:21 Elizabeth [name] ([id number]) -------------------------------------------------------------------------------- Debridement Details Patient Name: Elizabeth [name], [name] Date of Service: 11/02/2016 10:15 AM Medical Record Patient Account Number: [id number] Number: Treating RN: Baruch Gouty, RN, BSN, Rita June 26, 1951 [phone] y.o. Other Clinician: Date of Birth/Sex: Female) Treating Jadriel Saxer Primary Care Physician/Extender: Mila Palmer, LINDA Physician: Referring Physician: MILES, LINDA Weeks in Treatment: 1 Debridement Performed for Wound #4 Left,Plantar Toe Great Assessment: Performed By: Physician Ricard Dillon, MD Debridement: Debridement Pre-procedure Yes - 10:26 Verification/Time Out Taken: Start Time: 10:26 Pain Control: Lidocaine 4% Topical Solution Level: Skin/Subcutaneous Tissue Total Area Debrided (L x 0.5 (cm) x 0.5 (cm) = 0.25 (cm) W): Tissue and other Non-Viable, Fibrin/Slough, Subcutaneous material debrided: Instrument: Curette Bleeding: Minimum Hemostasis Achieved: Pressure End Time: 10:29 Procedural Pain: 0 Post Procedural Pain: 0 Response to Treatment:  Procedure was tolerated well Post Debridement Measurements of Total Wound Length: (cm) 0.5 Width: (cm) 0.5 Depth: (cm) 0.1 Volume: (cm) 0.02 Character of Wound/Ulcer Post Stable Debridement: Severity of Tissue Post Debridement: Fat layer exposed Post Procedure Diagnosis Same as Pre-procedure Electronic Signature(s) Signed: 11/02/2016 3:32:00 PM By: Linton Ham MD Elizabeth [name] ([id number]) Signed: 11/02/2016 3:56:30 PM By: Regan Lemming BSN, RN Entered By: Linton Ham on 11/02/2016 12:19:11 Elizabeth [name] ([id number]) -------------------------------------------------------------------------------- HPI Details Patient Name: Elizabeth [name], [name] Date of Service: 11/02/2016 10:15 AM Medical Record Patient Account Number: [id number] Number: Treating RN: Baruch Gouty, RN, BSN, Rita January 21, 1951 [phone] y.o. Other Clinician: Date of Birth/Sex: Female) Treating Deniesha Stenglein Primary Care Physician/Extender: Mila Palmer, LINDA Physician: Referring Physician: Gerre Scull in Treatment: 1 History of Present Illness HPI Description: Pleasant 65 year old with past medical history significant for diabetes (hemoglobin A1c 6.1 in February 2016), chronic venous stasis disease, and breast cancer. She developed bilateral lower extremity ulcerations and cellulitis and was hospitalized at [facility] in early March 2016. Ultrasound showed no evidence for DVT. Treated with IV vancomycin. Discharged on 01/09/2015 on Bactrim and in Unna boots, which she tolerated well. She is ambulating per her baseline. No significant pain. No claudication or rest pain. ABI noncompressible. Palpable DP bilaterally. She does not usually wear compression. Cannot apply compression stockings. No fever or chills. No significant drainage. Blood sugars less than 150. 01/28/2015 -- she has not yet received her appointment for a vascular studies both arterial and venous duplex. He is  otherwise doing well but has noticed some redness of the right lower extremity in an area where it is no open ulcer. The left lower extremity feels better. 02/04/2015 -- she has had her vascular studies done in [place] and she goes back to see the doctor tomorrow. I have asked her to get business card to them so that they can send [place] a report. she is not very compliant with her now for dressing and if it sits down she does not use a call to come  and get it fixed. Her hygiene too seems to be rather poor. 02/11/2015 -- we have got the vascular studies done at [facility] and the lower extremity arterial duplex evaluation done on 01/30/2015 revealed there was diffuse vessel wall calcification noted throughout the bilateral lower extremity arterial system there is possible arterial inflow disease. No hemodynamically significant stenosis is present. Bilateral calf arteries were not well visualized. the lower extremity venous duplex study done on 01/30/2015 revealed that the patient had no evidence of DVT on both extremities. There was deep venous reflux involving the left lower extremity. No incompetent bilateral great saphenous veins and right small saphenous vein. She was then seen by Dr. Scot Dock on 02/05/2015. His opinion was that she had adequate arterial flow although there was some evidenc e of calcified disease. However because she had a biphasic Doppler signal in the dorsalis pedis position he did not think it was necessary to do any further arterial workup. He thought it was safe to use mild compression stockings and elevate her legs. He has written her a prescription for knee-high compression stockings with a gradient of 15-20 mmHg and is encouraged her to wear these. 02/18/2015 is finally got her prescription filled and has brought her stockings of 15-20 mmHg and come ready to use these. Elizabeth [name] ([id number]) 10/26/16; this is a 65 year old woman with type 2  diabetes and according to her a history of peripheral neuropathy. Over the last 6 weeks she has noted a wound on the plantar aspect of her left first toe. There is no obvious precipitating features. She apparently does not have a history of PAD and has had that reviewed previously by vein and vascular in [place]. Her ABI in this clinic however was 1.55. She is been going to her primary physician's office and is been getting DuoDERM which is the primary dressing. She was also on oral antibiotics although I don't think that this was cultured. He tells me she did have a history of wounds previously but is not sure where on her feet. She is wearing ordinary footwear. [facility] she had arterial studies done in March 2016. At that time her ABIs were noncompressible in both legs however Doppler waveforms were multiphasic bilaterally. I do not see that she was actually seen by the physician. She was noted to have diffuse specimen calcification throughout the bilateral lower extremity arterial system. No hemodynamically significant stenosis was present. Bilateral calf arteries were not well visualized which was most likely due to calcifications. The patient also has chronic lower extremity edema. Studies of this in March 2016 showed no evidence of a DVT. As deep vein reflux involving the left lower extremity common femoral, femoral and greater saphenous vein. There was reflux on the right involving the great and small saphenous vein I note that she was seen in this clinic previously I think this was for wounds on her legs. She was discharged in compression which she tells me she still wears for chronic venous insufficiency and lymphedema. She also has a history of breast cancer, venous reflux and lymphedema 11/02/16; the area in question is on the plantar aspect of left great toe. This looks better this week. Still some dark areas of nonviable tissue required debridement but  overall improved Electronic Signature(s) Signed: 11/02/2016 3:32:00 PM By: Linton Ham MD Entered By: Linton Ham on 11/02/2016 12:20:13 Elizabeth [name] ([id number]) -------------------------------------------------------------------------------- Physical Exam Details Patient Name: Elizabeth [name], [name] Date of Service: 11/02/2016 10:15 AM Medical Record  Patient Account Number: [id number] Number: Treating RN: Baruch Gouty, RN, BSN, Rita 1951-10-13 [phone] y.o. Other Clinician: Date of Birth/Sex: Female) Treating Devyn Griffing Primary Care Physician/Extender: Mila Palmer, LINDA Physician: Referring Physician: MILES, LINDA Weeks in Treatment: 1 Constitutional Sitting or standing Blood Pressure is within target range for patient.. Pulse regular and within target range for patient.[name] Respirations regular, non-labored and within target range.. Temperature is normal and within the target range for the patient.. Patient's appearance is neat and clean. Appears in no acute distress. Well nourished and well developed.. Cardiovascular Pedal pulses palpable and strong bilaterally.. Notes Wound exam; small wound on the plantar aspect of her leg great toe this requires debridement of a #3 curet to remove nonviable surface tissue. The base seems to clean up quite nicely. I think this is smaller and healthier looking. Electronic Signature(s) Signed: 11/02/2016 3:32:00 PM By: Linton Ham MD Entered By: Linton Ham on 11/02/2016 12:21:12 Elizabeth [name] ([id number]) -------------------------------------------------------------------------------- Physician Orders Details Patient Name: Elizabeth [name]. Date of Service: 11/02/2016 10:15 AM Medical Record Patient Account Number: [id number] Number: Treating RN: Baruch Gouty, RN, BSN, Rita 1950/12/28 [phone] y.o. Other Clinician: Date of Birth/Sex: Female) Treating Devyn Sheerin Primary Care Physician/Extender: Mila Palmer,  LINDA Physician: Referring Physician: Gerre Scull in Treatment: 1 Verbal / Phone Orders: Yes Clinician: Afful, RN, BSN, Rita Read Back and Verified: Yes Diagnosis Coding Wound Cleansing Wound #4 Left,Plantar Toe Great o Cleanse wound with mild soap and water Anesthetic Wound #4 Left,Plantar Toe Great o Topical Lidocaine 4% cream applied to wound bed prior to debridement Primary Wound Dressing Wound #4 Left,Plantar Toe Great o Aquacel Ag Secondary Dressing Wound #4 Left,Plantar Toe Great o Gauze and Kerlix/Conform o Foam Dressing Change Frequency Wound #4 Left,Plantar Toe Great o Change dressing every other day. Follow-up Appointments Wound #4 Left,Plantar Toe Great o Return Appointment in 1 week. Edema Control Wound #4 Left,Plantar Toe Great o Patient to wear own compression stockings o Elevate legs to the level of the heart and pump ankles as often as possible Off-Loading Wound #4 Left,Plantar Toe Great Elizabeth [name], [name] ([id number]) o Open toe surgical shoe with peg assist. Additional Orders / Instructions Wound #4 Left,Plantar Toe Great o Increase protein intake. o Activity as tolerated Electronic Signature(s) Signed: 11/02/2016 3:32:00 PM By: Linton Ham MD Signed: 11/02/2016 3:56:30 PM By: Regan Lemming BSN, RN Entered By: Regan Lemming on 11/02/2016 10:28:50 Elizabeth [name] ([id number]) -------------------------------------------------------------------------------- Problem List Details Patient Name: Elizabeth [name], [name] Date of Service: 11/02/2016 10:15 AM Medical Record Patient Account Number: [id number] Number: Treating RN: Baruch Gouty, RN, BSN, Rita May 01, 1951 [phone] y.o. 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 Date of Service: 11/02/2016 10:15 AM Medical Record Patient Account Number: [id number] Number: Treating RN: Baruch Gouty, RN, BSN, Rita 1951-10-08 [phone] y.o. Other Clinician: Date of Birth/Sex: Female) Treating Tashari Schoenfelder Primary Care Physician/Extender: Mila Palmer, LINDA Physician: Referring Physician: MILES, LINDA Weeks in Treatment: 1 Subjective  Chief Complaint Information obtained from Patient Bilateral lower extremity phlebolymphedema, ulcerations, and cellulitis. 10/26/16. Patient is here today for reviewable wound on her left plantar first toe History of Present Illness (HPI) Pleasant 65 year old with past medical history significant for diabetes (hemoglobin A1c 6.1 in February 2016), chronic venous stasis disease, and breast cancer. She developed bilateral lower extremity ulcerations and cellulitis and was hospitalized at [facility] in early March 2016. Ultrasound showed no evidence for DVT. Treated with IV vancomycin. Discharged on 01/09/2015 on Bactrim and in Unna boots, which she tolerated well. She is ambulating per her baseline. No significant pain. No claudication or rest pain. ABI noncompressible. Palpable DP bilaterally. She does not usually wear compression. Cannot apply compression stockings. No fever or chills. No significant drainage. Blood sugars less than  150. 01/28/2015 -- she has not yet received her appointment for a vascular studies both arterial and venous duplex. He is otherwise doing well but has noticed some redness of the right lower extremity in an area where it is no open ulcer. The left lower extremity feels better. 02/04/2015 -- she has had her vascular studies done in [place] and she goes back to see the doctor tomorrow. I have asked her to get business card to them so that they can send [place] a report. she is not very compliant with her now for dressing and if it sits down she does not use a call to come and get it fixed. Her hygiene too seems to be rather poor. 02/11/2015 -- we have got the vascular studies done at [facility] and the lower extremity arterial duplex evaluation done on 01/30/2015 revealed there was diffuse vessel wall calcification noted throughout the bilateral lower extremity arterial system there is possible arterial inflow disease. No hemodynamically significant stenosis is present. Bilateral calf arteries were not well visualized. the lower extremity venous duplex study done on 01/30/2015 revealed that the patient had no evidence of DVT on both extremities. There was deep venous reflux involving the left lower extremity. No incompetent bilateral great saphenous veins and right small saphenous vein. She was then seen by Dr. Scot Dock on 02/05/2015. His opinion was that she had adequate arterial flow although there was some evidenc e of calcified disease. However because she had a biphasic Doppler signal in the dorsalis pedis position he did not think it was necessary to do any further arterial workup. He thought it was safe to use mild compression stockings and elevate her legs. He has written her a Portell, Camie L. ([id number]) prescription for knee-high compression stockings with a gradient of 15-20 mmHg and is encouraged her to wear these. 02/18/2015 is finally got her prescription filled and has brought her  stockings of 15-20 mmHg and come ready to use these. READMISSION 10/26/16; this is a 65 year old woman with type 2 diabetes and according to her a history of peripheral neuropathy. Over the last 6 weeks she has noted a wound on the plantar aspect of her left first toe. There is no obvious precipitating features. She apparently does not have a history of PAD and has had that reviewed previously by vein and vascular in [place]. Her ABI in this clinic however was 1.55. She is been going to her primary physician's office and is been getting DuoDERM which is the primary dressing. She was also on oral antibiotics although I don't think that this was cultured. He tells me she did have a history of wounds previously but is not sure where on her feet. She  is wearing ordinary footwear. [facility] she had arterial studies done in March 2016. At that time her ABIs were noncompressible in both legs however Doppler waveforms were multiphasic bilaterally. I do not see that she was actually seen by the physician. She was noted to have diffuse specimen calcification throughout the bilateral lower extremity arterial system. No hemodynamically significant stenosis was present. Bilateral calf arteries were not well visualized which was most likely due to calcifications. The patient also has chronic lower extremity edema. Studies of this in March 2016 showed no evidence of a DVT. As deep vein reflux involving the left lower extremity common femoral, femoral and greater saphenous vein. There was reflux on the right involving the great and small saphenous vein I note that she was seen in this clinic previously I think this was for wounds on her legs. She was discharged in compression which she tells me she still wears for chronic venous insufficiency and lymphedema. She also has a history of breast cancer, venous reflux and lymphedema 11/02/16; the area in question is on the plantar aspect  of left great toe. This looks better this week. Still some dark areas of nonviable tissue required debridement but overall improved Objective Constitutional Sitting or standing Blood Pressure is within target range for patient.. Pulse regular and within target range for patient.[name] Respirations regular, non-labored and within target range.. Temperature is normal and within the target range for the patient.. Patient's appearance is neat and clean. Appears in no acute distress. Well nourished and well developed.. Vitals Time Taken: 10:10 AM, Height: 63 in, Weight: 191 lbs, BMI: 33.8, Temperature: 98.1 F, Pulse: 67 bpm, Respiratory Rate: 16 breaths/min, Blood Pressure: 158/66 mmHg. Cardiovascular Pedal pulses palpable and strong bilaterally.[name] AIMEN, LEEB ([id number]) General Notes: Wound exam; small wound on the plantar aspect of her leg great toe this requires debridement of a #3 curet to remove nonviable surface tissue. The base seems to clean up quite nicely. I think this is smaller and healthier looking. Integumentary (Hair, Skin) Wound #4 status is Open. Original cause of wound was Gradually Appeared. The wound is located on the [employer]. The wound measures 0.5cm length x 0.5cm width x 0.1cm depth; 0.196cm^2 area and 0.02cm^3 volume. The wound is limited to skin breakdown. There is no tunneling or undermining noted. There is a small amount of sanguinous drainage noted. The wound margin is distinct with the outline attached to the wound base. There is small (1-33%) pink, pale granulation within the wound bed. There is a medium (34-66%) amount of necrotic tissue within the wound bed including Eschar and Adherent Slough. The periwound skin appearance exhibited: Dry/Scaly, Moist. The periwound skin appearance did not exhibit: Callus, Crepitus, Excoriation, Fluctuance, Friable, Induration, Localized Edema, Rash, Scarring, Maceration, Atrophie Blanche, Cyanosis, Ecchymosis,  Hemosiderin Staining, Mottled, Pallor, Rubor, Erythema. Periwound temperature was noted as No Abnormality. Assessment Active Problems ICD-10 E11.621 - Type 2 diabetes mellitus with foot ulcer L97.522 - Non-pressure chronic ulcer of other part of left foot with fat layer exposed E11.42 - Type 2 diabetes mellitus with diabetic polyneuropathy Procedures Wound #4 Wound #4 is a Diabetic Wound/Ulcer of the Lower Extremity located on the Left,Plantar Toe Great . There was a Skin/Subcutaneous Tissue Debridement [id number]) debridement with total area of 0.25 sq cm performed by Ricard Dillon, MD. with the following instrument(s): Curette to remove Non-Viable tissue/material including Fibrin/Slough and Subcutaneous after achieving pain control using Lidocaine 4% Topical Solution. A time out was conducted at  10:26, prior to the start of the procedure. A Minimum amount of bleeding was controlled with Pressure. The procedure was tolerated well with a pain level of 0 throughout and a pain level of 0 following the procedure. Post Debridement Measurements: 0.5cm length x 0.5cm width x 0.1cm depth; 0.02cm^3 volume. Character of Wound/Ulcer Post Debridement is stable. Severity of Tissue Post Debridement is: Fat layer exposed. Post procedure Diagnosis Wound #4: Same as Pre-Procedure Ensey, Rupal L. ([id number]) Plan Wound Cleansing: Wound #4 Left,Plantar Toe Great: Cleanse wound with mild soap and water Anesthetic: Wound #4 Left,Plantar Toe Great: Topical Lidocaine 4% cream applied to wound bed prior to debridement Primary Wound Dressing: Wound #4 Left,Plantar Toe Great: Aquacel Ag Secondary Dressing: Wound #4 Left,Plantar Toe Great: Gauze and Kerlix/Conform Foam Dressing Change Frequency: Wound #4 Left,Plantar Toe Great: Change dressing every other day. Follow-up Appointments: Wound #4 Left,Plantar Toe Great: Return Appointment in 1 week. Edema Control: Wound #4 Left,Plantar Toe  Great: Patient to wear own compression stockings Elevate legs to the level of the heart and pump ankles as often as possible Off-Loading: Wound #4 Left,Plantar Toe Great: Open toe surgical shoe with peg assist. Additional Orders / Instructions: Wound #4 Left,Plantar Toe Great: Increase protein intake. Activity as tolerated #1 No change in the Aquacel AG dressings Electronic Signature(s) Signed: 11/02/2016 3:32:00 PM By: Linton Ham MD Elizabeth [name] ([id number]) Entered By: Linton Ham on 11/02/2016 12:22:03 Elizabeth [name] ([id number]) -------------------------------------------------------------------------------- SuperBill Details Patient Name: Elizabeth [name], [name] Date of Service: 11/02/2016 Medical Record Patient Account Number: [id number] Number: Treating RN: Baruch Gouty, RN, BSN, Rita [phone] y.o. 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 Facility Procedures CPT4 Code Description: [id number] - DEB SUBQ TISSUE 20 SQ CM/< ICD-10 Description Diagnosis E11.621 Type 2 diabetes mellitus with foot ulcer L97.522 Non-pressure chronic ulcer of other part of left foot Modifier: with fat lay Quantity: 1 er exposed Physician Procedures CPT4 Code Description: [id number] - WC PHYS SUBQ TISS 20 SQ CM ICD-10 Description Diagnosis E11.621 Type 2 diabetes mellitus with foot ulcer L97.522 Non-pressure chronic ulcer of other part of left foot Modifier: with fat laye Quantity: 1 r exposed Electronic Signature(s) Signed: 11/02/2016 3:32:00 PM By: Linton Ham MD Entered By:  Linton Ham on 11/02/2016 12:22:24

## 2016-11-02 NOTE — Progress Notes (Addendum)
Elizabeth [name] ([id number]) Visit Report for 11/02/2016 Arrival Information Details Patient Name: Elizabeth, [name]. Date of Service: 11/02/2016 10:15 AM Medical Record Patient Account Number: [id number] Number: Treating RN: Baruch Gouty, RN, BSN, Rita May 14, 1951 [phone] y.o. Other Clinician: Date of Birth/Sex: Female) Treating ROBSON, MICHAEL Primary Care Physician/Extender: Mila Palmer, LINDA Physician: Referring Physician: MILES, LINDA Weeks in Treatment: 1 Visit Information History Since Last Visit All ordered tests and consults were completed: No Patient Arrived: Ambulatory Added or deleted any medications: No Arrival Time: 10:04 Any new allergies or adverse reactions: No Accompanied By: self Had a fall or experienced change in No Transfer Assistance: None activities of daily living that may affect Patient Identification Verified: Yes risk of falls: Secondary Verification Process Yes Signs or symptoms of abuse/neglect since last No Completed: visito Patient Requires Transmission-Based No Hospitalized since last visit: No Precautions: Has Dressing in Place as Prescribed: Yes Patient Has Alerts: No Pain Present Now: No Electronic Signature(s) Signed: 11/02/2016 3:56:30 PM By: Regan Lemming BSN, RN Entered By: Regan Lemming on 11/02/2016 10:04:32 Elizabeth [name] ([id number]) -------------------------------------------------------------------------------- Encounter Discharge Information Details Patient Name: Elizabeth [name], [name] Date of Service: 11/02/2016 10:15 AM Medical Record Patient Account Number: [id number] Number: Treating RN: Baruch Gouty, RN, BSN, Rita Jan 19, 1951 [phone] y.o. Other Clinician: Date of Birth/Sex: Female) Treating ROBSON, MICHAEL Primary Care Physician/Extender: Mila Palmer, LINDA Physician: Referring Physician: MILES, LINDA Weeks in Treatment: 1 Encounter Discharge Information Items Discharge Pain Level: 0 Discharge Condition:  Stable Ambulatory Status: Ambulatory Discharge Destination: Home Transportation: Private Auto Accompanied By: self Schedule Follow-up Appointment: No Medication Reconciliation completed and provided to Patient/Care No Marieme Mcmackin: Provided on Clinical Summary of Care: 11/02/2016 Form Type Recipient Paper Patient [place] Electronic Signature(s) Signed: 11/02/2016 3:58:45 PM By: Regan Lemming BSN, RN Previous Signature: 11/02/2016 10:34:52 AM Version By: Ruthine Dose Entered By: Regan Lemming on 11/02/2016 15:58:45 [name], [name] ([id number]) -------------------------------------------------------------------------------- Lower Extremity Assessment Details Patient Name: Elizabeth [name], [name] Date of Service: 11/02/2016 10:15 AM Medical Record Patient Account Number: [id number] Number: Treating RN: Baruch Gouty, RN, BSN, Rita 07-22-1951 [phone] y.o. Other Clinician: Date of Birth/Sex: Female) Treating ROBSON, MICHAEL Primary Care Physician/Extender: Mila Palmer, LINDA Physician: Referring Physician: MILES, LINDA Weeks in Treatment: 1 Edema Assessment Assessed: [Left: No] [Right: No] E[Left: dema] [Right: :] Calf Left: Right: Point of Measurement: 36 cm From Medial Instep 40.7 cm 40.6 cm Ankle Left: Right: Point of Measurement: 10 cm From Medial Instep 28.7 cm 28.9 cm Vascular Assessment Claudication: Claudication Assessment [Left:None] [Right:None] Pulses: Dorsalis Pedis Palpable: [Left:Yes] [Right:Yes] Posterior Tibial Extremity colors, hair growth, and conditions: Extremity Color: [Left:Normal] [Right:Normal] Hair Growth on Extremity: [Left:No] [Right:No] Temperature of Extremity: [Left:Warm] [Right:Warm] Capillary Refill: [Left:< 3 seconds] [Right:< 3 seconds] Toe Nail Assessment Left: Right: Thick: Yes Discolored: Yes Yes Deformed: No No Improper Length and Hygiene: Yes Yes Electronic Signature(s) Elizabeth [name], Elizabeth [name] ([id number]) Signed: 11/02/2016 3:56:30 PM By: Regan Lemming BSN, RN Entered By: Regan Lemming on 11/02/2016 10:05:50 Elizabeth [name] ([id number]) -------------------------------------------------------------------------------- Multi Wound Chart Details Patient Name: Elizabeth [name], [name] Date of Service: 11/02/2016 10:15 AM Medical Record Patient Account Number: [id number] Number: Treating RN: Baruch Gouty, RN, BSN, Rita [birth year] [phone] y.o. 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] Comorbid History: [4:Asthma, Hypertension, Peripheral Venous Disease, Type II Diabetes, Neuropathy, Seizure Disorder, Received Radiation] [N/A:N/A] Date Acquired: [4:09/14/2016] [N/A:N/A] Weeks of Treatment: [4:1] [N/A:N/A] Wound Status: [4:Open] [N/A:N/A] Measurements L x W x D 0.5x0.5x0.1 [N/A:N/A] (cm) Area (cm) : [4:0.196] [N/A:N/A] Volume (cm) : [4:0.02] [N/A:N/A] % Reduction in Area: [4:28.70%] [N/A:N/A] % Reduction in Volume: 63.60% [N/A:N/A] Classification: [4:Grade 1] [N/A:N/A] Exudate Amount: [4:Small] [N/A:N/A] Exudate Type: [4:Sanguinous] [N/A:N/A] Exudate Color: [4:red] [N/A:N/A] Wound Margin: [4:Distinct, outline attached N/A] Granulation Amount: [4:Small (1-33%)] [N/A:N/A] Granulation Quality: [4:Pink, Pale] [N/A:N/A] Necrotic Amount: Medium (34-66%) N/A N/A Necrotic Tissue: Eschar, Adherent Slough N/A N/A Exposed Structures: Fascia: No N/A N/A Fat: No Tendon: No Muscle: No Joint: No Bone: No Limited to  Skin Breakdown Epithelialization: Medium (34-66%) N/A N/A Debridement: Debridement [id number] N/A N/A [id number]) Pre-procedure 10:26 N/A N/A Verification/Time Out Taken: Pain Control: Lidocaine 4% Topical N/A N/A Solution Tissue Debrided: Fibrin/Slough, N/A N/A Subcutaneous Level: Skin/Subcutaneous N/A N/A Tissue Debridement Area (sq 0.25 N/A N/A cm): Instrument: Curette N/A N/A Bleeding: Minimum N/A N/A Hemostasis Achieved: Pressure N/A N/A Procedural Pain: 0 N/A N/A Post Procedural Pain: 0 N/A N/A Debridement Treatment Procedure was tolerated N/A N/A Response: well Post Debridement 0.5x0.5x0.1 N/A N/A Measurements L x W x D (cm) Post Debridement 0.02 N/A N/A Volume: (cm) Periwound Skin Texture: Edema: No N/A N/A Excoriation: No Induration: No Callus: No Crepitus: No Fluctuance: No Friable: No Rash: No Scarring: No Periwound Skin Moist: Yes N/A N/A Moisture: Dry/Scaly: Yes Maceration: No Periwound Skin Color: Atrophie Blanche: No N/A N/A Cyanosis: No Dyck, [name] ([id number]) Ecchymosis: No Erythema: No Hemosiderin Staining: No Mottled: No Pallor: No Rubor: No Temperature: No Abnormality N/A N/A Tenderness on No N/A N/A Palpation: Wound Preparation: Ulcer Cleansing: N/A N/A Rinsed/Irrigated with Saline Topical Anesthetic Applied: Other: lidocaine 4% Procedures Performed: Debridement N/A N/A Treatment Notes Electronic Signature(s) Signed: 11/02/2016 3:32:00 PM By: Linton Ham MD Entered By: Linton Ham on 11/02/2016 12:18:50 Elizabeth [name] ([id number]) -------------------------------------------------------------------------------- [place] Details Patient Name: Elizabeth [name], [name] Date of Service: 11/02/2016 10:15 AM Medical Record Patient Account Number: [id number] Number: Treating RN: Baruch Gouty, RN, BSN, Rita 05-May-1951 [phone] y.o. Other Clinician: Date of Birth/Sex: Female) Treating ROBSON,  MICHAEL Primary Care Physician/Extender: Mila Palmer, LINDA Physician: Referring Physician: MILES, LINDA Weeks in Treatment: 1 Active Inactive Abuse / Safety / Falls / Self Care Management Nursing Diagnoses: Impaired home maintenance Impaired physical mobility Self care deficit: actual or potential Goals: Patient will remain injury free Date Initiated: 10/26/2016 Goal Status: Active Patient/caregiver will verbalize understanding of skin care regimen Date Initiated: 10/26/2016 Goal Status: Active Patient/caregiver will verbalize/demonstrate measure taken to improve self care Date Initiated: 10/26/2016 Goal Status: Active Patient/caregiver will verbalize/demonstrate measures taken to improve the patient's personal safety Date Initiated: 10/26/2016 Goal Status: Active Patient/caregiver will verbalize/demonstrate measures taken to prevent injury and/or falls Date Initiated: 10/26/2016 Goal Status: Active Patient/caregiver will verbalize/demonstrate understanding of what to do in case of emergency Date Initiated: 10/26/2016 Goal Status: Active Interventions: Assess fall risk on admission and as needed Assess: immobility, friction, shearing, incontinence upon admission and as needed Assess impairment of mobility on admission and as needed per policy Assess self care needs on admission and as needed Flinn, [name] ([id number]) Patient referred to community resources (specify in notes) Provide education on basic hygiene Provide education on fall prevention Provide education on personal and home safety Provide education on safe  transfers Notes: Orientation to the Wound Care Program Nursing Diagnoses: Knowledge deficit related to the wound healing center program Goals: Patient/caregiver will verbalize understanding of the [facility] Date Initiated: 10/26/2016 Goal Status: Active Interventions: Provide education on orientation to the wound  center Notes: Wound/Skin Impairment Nursing Diagnoses: Impaired tissue integrity Knowledge deficit related to ulceration/compromised skin integrity Goals: Patient/caregiver will verbalize understanding of skin care regimen Date Initiated: 10/26/2016 Goal Status: Active Ulcer/skin breakdown will have a volume reduction of 30% by week 4 Date Initiated: 10/26/2016 Goal Status: Active Ulcer/skin breakdown will have a volume reduction of 50% by week 8 Date Initiated: 10/26/2016 Goal Status: Active Ulcer/skin breakdown will have a volume reduction of 80% by week 12 Date Initiated: 10/26/2016 Goal Status: Active Ulcer/skin breakdown will heal within 14 weeks Date Initiated: 10/26/2016 Goal Status: Active Elizabeth [name], Elizabeth [name] ([id number]) Interventions: Assess patient/caregiver ability to obtain necessary supplies Assess patient/caregiver ability to perform ulcer/skin care regimen upon admission and as needed Assess ulceration(s) every visit Provide education on ulcer and skin care Treatment Activities: Referred to DME Jayton Popelka for dressing supplies : 10/26/2016 Skin care regimen initiated : 10/26/2016 Topical wound management initiated : 10/26/2016 Notes: Electronic Signature(s) Signed: 11/02/2016 3:56:30 PM By: Regan Lemming BSN, RN Entered By: Regan Lemming on 11/02/2016 10:27:17 Elizabeth [name] ([id number]) -------------------------------------------------------------------------------- Pain Assessment Details Patient Name: Elizabeth [name], [name] Date of Service: 11/02/2016 10:15 AM Medical Record Patient Account Number: [id number] Number: Treating RN: Baruch Gouty, RN, BSN, Rita 10/29/1951 [phone] y.o. Other Clinician: Date of Birth/Sex: Female) Treating ROBSON, MICHAEL Primary Care Physician/Extender: Mila Palmer, LINDA Physician: Referring Physician: MILES, LINDA Weeks in Treatment: 1 Active Problems Location of Pain Severity and Description of Pain Patient Has Paino No Site  Locations With Dressing Change: No Pain Management and Medication Current Pain Management: Electronic Signature(s) Signed: 11/02/2016 3:56:30 PM By: Regan Lemming BSN, RN Entered By: Regan Lemming on 11/02/2016 10:04:39 Elizabeth [name] ([id number]) -------------------------------------------------------------------------------- Patient/Caregiver Education Details Patient Name: Elizabeth [name]. Date of Service: 11/02/2016 10:15 AM Medical Record Patient Account Number: [id number] Number: Treating RN: Baruch Gouty, RN, BSN, Rita [birth year] [phone] y.o. Other Clinician: Date of Birth/Gender: Female) Treating ROBSON, MICHAEL Primary Care Physician/Extender: Mila Palmer, LINDA Physician: Weeks in Treatment: 1 Referring Physician: Delight Stare Education Assessment Education Provided To: Patient Education Topics Provided Basic Hygiene: Methods: Explain/Verbal Responses: State content correctly Safety: Methods: Explain/Verbal Responses: State content correctly Welcome To [facility]: Methods: Explain/Verbal Responses: State content correctly Wound/Skin Impairment: Methods: Explain/Verbal Responses: State content correctly Electronic Signature(s) Signed: 11/03/2016 4:25:56 PM By: Regan Lemming BSN, RN Entered By: Regan Lemming on 11/02/2016 15:59:08 Elizabeth [name], Elizabeth [name] ([id number]) -------------------------------------------------------------------------------- Wound Assessment Details Patient Name: Elizabeth [name], [name] Date of Service: 11/02/2016 10:15 AM Medical Record Patient Account Number: [id number] Number: Treating RN: Baruch Gouty, RN, BSN, Rita [birth year] [phone] y.o. Other Clinician: Date of Birth/Sex: Female) Treating ROBSON, MICHAEL Primary Care Physician/Extender: Mila Palmer, LINDA Physician: Referring Physician: MILES, LINDA Weeks in Treatment: 1 Wound Status Wound Number: 4 Primary Diabetic Wound/Ulcer of the Lower Etiology: Extremity Wound Location: Left  Toe Great - Plantar Wound Open Wounding Event: Gradually Appeared Status: Date Acquired: 09/14/2016 Comorbid Asthma, Hypertension, Peripheral Weeks Of Treatment: 1 History: Venous Disease, Type II Diabetes, Clustered Wound: No Neuropathy, Seizure Disorder, Received Radiation Photos Photo Uploaded By: Regan Lemming on 11/02/2016 15:55:13 Wound Measurements Length: (cm) 0.5 Width: (cm) 0.5 Depth: (cm) 0.1 Area: (cm) 0.196 Volume: (cm) 0.02 % Reduction in Area: 28.7% % Reduction in Volume: 63.6% Epithelialization: Medium (  34-66%) Tunneling: No Undermining: No Wound Description Classification: Grade 1 Wound Margin: Distinct, outline attached Exudate Amount: Small Exudate Type: Sanguinous Exudate Color: red Foul Odor After Cleansing: No Wound Bed Shonk, [name] ([id number]) Granulation Amount: Small (1-33%) Exposed Structure Granulation Quality: Pink, Pale Fascia Exposed: No Necrotic Amount: Medium (34-66%) Fat Layer Exposed: No Necrotic Quality: Eschar, Adherent Slough Tendon Exposed: No Muscle Exposed: No Joint Exposed: No Bone Exposed: No Limited to Skin Breakdown Periwound Skin Texture Texture Color No Abnormalities Noted: No No Abnormalities Noted: No Callus: No Atrophie Blanche: No Crepitus: No Cyanosis: No Excoriation: No Ecchymosis: No Fluctuance: No Erythema: No Friable: No Hemosiderin Staining: No Induration: No Mottled: No Localized Edema: No Pallor: No Rash: No Rubor: No Scarring: No Temperature / Pain Moisture Temperature: No Abnormality No Abnormalities Noted: No Dry / Scaly: Yes Maceration: No Moist: Yes Wound Preparation Ulcer Cleansing: Rinsed/Irrigated with Saline Topical Anesthetic Applied: Other: lidocaine 4%, Treatment Notes Wound #4 (Left, Plantar Toe Great) 1. Cleansed with: Clean wound with Normal Saline 4. Dressing Applied: Aquacel Ag 5. Secondary Dressing Applied Gauze and Kerlix/Conform 7. Secured  with Recruitment consultant) Signed: 11/02/2016 3:56:30 PM By: Regan Lemming BSN, RN Entered By: Regan Lemming on 11/02/2016 10:27:09 Elizabeth [name] ([id number]) Elizabeth [name] ([id number]) -------------------------------------------------------------------------------- Vitals Details Patient Name: Elizabeth [name], [name] Date of Service: 11/02/2016 10:15 AM Medical Record Patient Account Number: [id number] Number: Treating RN: Baruch Gouty, RN, BSN, Rita 1951/03/13 [phone] y.o. Other Clinician: Date of Birth/Sex: Female) Treating ROBSON, MICHAEL Primary Care Physician/Extender: Mila Palmer, LINDA Physician: Referring Physician: MILES, LINDA Weeks in Treatment: 1 Vital Signs Time Taken: 10:10 Temperature (F): 98.1 Height (in): 63 Pulse (bpm): 67 Weight (lbs): 191 Respiratory Rate (breaths/min): 16 Body Mass Index (BMI): 33.8 Blood Pressure (mmHg): 158/66 Reference Range: 80 - 120 mg / dl Electronic Signature(s) Signed: 11/02/2016 3:56:30 PM By: Regan Lemming BSN, RN Entered By: Regan Lemming on 11/02/2016 10:10:49

## 2016-11-09 ENCOUNTER — Encounter: Payer: Medicare HMO | Attending: Internal Medicine | Admitting: Internal Medicine

## 2016-11-09 DIAGNOSIS — Z7984 Long term (current) use of oral hypoglycemic drugs: Secondary | ICD-10-CM | POA: Insufficient documentation

## 2016-11-09 DIAGNOSIS — Z853 Personal history of malignant neoplasm of breast: Secondary | ICD-10-CM | POA: Insufficient documentation

## 2016-11-09 DIAGNOSIS — E11621 Type 2 diabetes mellitus with foot ulcer: Secondary | ICD-10-CM | POA: Insufficient documentation

## 2016-11-09 DIAGNOSIS — I10 Essential (primary) hypertension: Secondary | ICD-10-CM | POA: Diagnosis not present

## 2016-11-09 DIAGNOSIS — H5462 Unqualified visual loss, left eye, normal vision right eye: Secondary | ICD-10-CM | POA: Insufficient documentation

## 2016-11-09 DIAGNOSIS — L97522 Non-pressure chronic ulcer of other part of left foot with fat layer exposed: Secondary | ICD-10-CM | POA: Diagnosis not present

## 2016-11-09 DIAGNOSIS — E1142 Type 2 diabetes mellitus with diabetic polyneuropathy: Secondary | ICD-10-CM | POA: Insufficient documentation

## 2016-11-09 DIAGNOSIS — Z8673 Personal history of transient ischemic attack (TIA), and cerebral infarction without residual deficits: Secondary | ICD-10-CM | POA: Diagnosis not present

## 2016-11-10 NOTE — Progress Notes (Signed)
ALAHNI, ARRIZON ([id number]) Visit Report for 11/09/2016 Chief Complaint Document Details Patient Name: Elizabeth [name], [name] Date of Service: 11/09/2016 10:15 AM Medical Record Patient Account Number: [id number] Number: Treating RN: Baruch Gouty, RN, BSN, Rita [birth year] [phone] y.o. Other Clinician: Date of Birth/Sex: Female) Treating Cherisse Carrell Primary Care Physician/Extender: Mila Palmer, LINDA Physician: Referring Physician: MILES, LINDA Weeks in Treatment: 2 Information Obtained from: Patient Chief Complaint Bilateral lower extremity phlebolymphedema, ulcerations, and cellulitis. 10/26/16. Patient is here today for reviewable wound on her left plantar first toe Electronic Signature(s) Signed: 11/09/2016 4:57:22 PM By: Linton Ham MD Entered By: Linton Ham on 11/09/2016 11:27:39 Elizabeth [name] ([id number]) -------------------------------------------------------------------------------- Debridement Details Patient Name: Elizabeth [name], [name] Date of Service: 11/09/2016 10:15 AM Medical Record Patient Account Number: [id number] Number: Treating RN: Baruch Gouty, RN, BSN, Rita 10/29/1951 [phone] y.o. Other Clinician: Date of Birth/Sex: Female) Treating Demari Kropp Primary Care Physician/Extender: Mila Palmer, LINDA Physician: Referring Physician: MILES, LINDA Weeks in Treatment: 2 Debridement Performed for Wound #4 Left,Plantar Toe Great Assessment: Performed By: Physician Ricard Dillon, MD Debridement: Debridement Pre-procedure Yes - 10:43 Verification/Time Out Taken: Start Time: 10:43 Pain Control: Lidocaine 4% Topical Solution Level: Skin/Subcutaneous Tissue Total Area Debrided (L x 0.1 (cm) x 0.1 (cm) = 0.01 (cm) W): Tissue and other Non-Viable, Callus, Eschar, Subcutaneous material debrided: Instrument: Curette Bleeding: Minimum Hemostasis Achieved: Pressure End Time: 10:45 Procedural Pain: 0 Post Procedural Pain: 0 Response to Treatment:  Procedure was tolerated well Post Debridement Measurements of Total Wound Length: (cm) 0.2 Width: (cm) 0.2 Depth: (cm) 0.1 Volume: (cm) 0.003 Character of Wound/Ulcer Post Stable Debridement: Severity of Tissue Post Debridement: Fat layer exposed Post Procedure Diagnosis Same as Pre-procedure Electronic Signature(s) Signed: 11/09/2016 4:52:38 PM By: Regan Lemming BSN, RN Elizabeth [name] ([id number]) Signed: 11/09/2016 4:57:22 PM By: Linton Ham MD Entered By: Linton Ham on 11/09/2016 11:26:58 Elizabeth [name], Elizabeth [name] ([id number]) -------------------------------------------------------------------------------- HPI Details Patient Name: Elizabeth [name], [name] Date of Service: 11/09/2016 10:15 AM Medical Record Patient Account Number: [id number] Number: Treating RN: Baruch Gouty, RN, BSN, Rita October 01, 1951 [phone] y.o. Other Clinician: Date of Birth/Sex: Female) Treating Roshawna Colclasure Primary Care Physician/Extender: Mila Palmer, LINDA Physician: Referring Physician: Gerre Scull in Treatment: 2 History of Present Illness HPI Description: Pleasant 66 year old with past medical history significant for diabetes (hemoglobin A1c 6.1 in February 2016), chronic venous stasis disease, and breast cancer. She developed bilateral lower extremity ulcerations and cellulitis and was hospitalized at [facility] in early March 2016. Ultrasound showed no evidence for DVT. Treated with IV vancomycin. Discharged on 01/09/2015 on Bactrim and in Unna boots, which she tolerated well. She is ambulating per her baseline. No significant pain. No claudication or rest pain. ABI noncompressible. Palpable DP bilaterally. She does not usually wear compression. Cannot apply compression stockings. No fever or chills. No significant drainage. Blood sugars less than 150. 01/28/2015 -- she has not yet received her appointment for a vascular studies both arterial and venous duplex. He is  otherwise doing well but has noticed some redness of the right lower extremity in an area where it is no open ulcer. The left lower extremity feels better. 02/04/2015 -- she has had her vascular studies done in [place] and she goes back to see the doctor tomorrow. I have asked her to get business card to them so that they can send [place] a report. she is not very compliant with her now for dressing and if it sits down she does not use a call to  come and get it fixed. Her hygiene too seems to be rather poor. 02/11/2015 -- we have got the vascular studies done at [facility] and the lower extremity arterial duplex evaluation done on 01/30/2015 revealed there was diffuse vessel wall calcification noted throughout the bilateral lower extremity arterial system there is possible arterial inflow disease. No hemodynamically significant stenosis is present. Bilateral calf arteries were not well visualized. the lower extremity venous duplex study done on 01/30/2015 revealed that the patient had no evidence of DVT on both extremities. There was deep venous reflux involving the left lower extremity. No incompetent bilateral great saphenous veins and right small saphenous vein. She was then seen by Dr. Scot Dock on 02/05/2015. His opinion was that she had adequate arterial flow although there was some evidenc e of calcified disease. However because she had a biphasic Doppler signal in the dorsalis pedis position he did not think it was necessary to do any further arterial workup. He thought it was safe to use mild compression stockings and elevate her legs. He has written her a prescription for knee-high compression stockings with a gradient of 15-20 mmHg and is encouraged her to wear these. 02/18/2015 is finally got her prescription filled and has brought her stockings of 15-20 mmHg and come ready to use these. Elizabeth [name], Elizabeth [name] ([id number]) 10/26/16; this is a 66 year old woman with type 2  diabetes and according to her a history of peripheral neuropathy. Over the last 6 weeks she has noted a wound on the plantar aspect of her left first toe. There is no obvious precipitating features. She apparently does not have a history of PAD and has had that reviewed previously by vein and vascular in [place]. Her ABI in this clinic however was 1.55. She is been going to her primary physician's office and is been getting DuoDERM which is the primary dressing. She was also on oral antibiotics although I don't think that this was cultured. He tells me she did have a history of wounds previously but is not sure where on her feet. She is wearing ordinary footwear. [facility] she had arterial studies done in March 2016. At that time her ABIs were noncompressible in both legs however Doppler waveforms were multiphasic bilaterally. I do not see that she was actually seen by the physician. She was noted to have diffuse specimen calcification throughout the bilateral lower extremity arterial system. No hemodynamically significant stenosis was present. Bilateral calf arteries were not well visualized which was most likely due to calcifications. The patient also has chronic lower extremity edema. Studies of this in March 2016 showed no evidence of a DVT. As deep vein reflux involving the left lower extremity common femoral, femoral and greater saphenous vein. There was reflux on the right involving the great and small saphenous vein I note that she was seen in this clinic previously I think this was for wounds on her legs. She was discharged in compression which she tells me she still wears for chronic venous insufficiency and lymphedema. She also has a history of breast cancer, venous reflux and lymphedema 11/02/16; the area in question is on the plantar aspect of left great toe. This looks better this week. Still some dark areas of nonviable tissue required debridement but  overall improved. 11/09/16; patient arrives today with wound generally looking better. Has been using silver alginate. Electronic Signature(s) Signed: 11/09/2016 4:57:22 PM By: Linton Ham MD Entered By: Linton Ham on 11/09/2016 11:28:53 Elizabeth [name] ([id number]) -------------------------------------------------------------------------------- Physical  Exam Details Patient Name: Elizabeth [name], [name] Date of Service: 11/09/2016 10:15 AM Medical Record Patient Account Number: [id number] Number: Treating RN: Baruch Gouty, RN, BSN, Rita [birth year] [phone] y.o. Other Clinician: Date of Birth/Sex: Female) Treating Javon Snee Primary Care Physician/Extender: Mila Palmer, LINDA Physician: Referring Physician: MILES, LINDA Weeks in Treatment: 2 Constitutional Sitting or standing Blood Pressure is within target range for patient.. Pulse regular and within target range for patient.[name] Respirations regular, non-labored and within target range.. Temperature is normal and within the target range for the patient.. Notes Wound exam; patient arrives today with surface eschar removed with a #3 curet. Nonviable subcutaneous tissue also removed. Wound continues to progress and looks as though there is a viable surface starting to form. There is no evidence of infection Electronic Signature(s) Signed: 11/09/2016 4:57:22 PM By: Linton Ham MD Entered By: Linton Ham on 11/09/2016 11:30:10 Elizabeth [name] ([id number]) -------------------------------------------------------------------------------- Physician Orders Details Patient Name: Elizabeth [name]. Date of Service: 11/09/2016 10:15 AM Medical Record Patient Account Number: [id number] Number: Treating RN: Baruch Gouty, RN, BSN, Rita 11-16-1950 [phone] y.o. Other Clinician: Date of Birth/Sex: Female) Treating Mckennon Zwart Primary Care Physician/Extender: Mila Palmer, LINDA Physician: Referring Physician: Gerre Scull in  Treatment: 2 Verbal / Phone Orders: Yes Clinician: Afful, RN, BSN, Rita Read Back and Verified: Yes Diagnosis Coding Wound Cleansing Wound #4 Left,Plantar Toe Great o Cleanse wound with mild soap and water Anesthetic Wound #4 Left,Plantar Toe Great o Topical Lidocaine 4% cream applied to wound bed prior to debridement Primary Wound Dressing Wound #4 Left,Plantar Toe Great o Aquacel Ag Secondary Dressing Wound #4 Left,Plantar Toe Great o Gauze and Kerlix/Conform o Foam Dressing Change Frequency Wound #4 Left,Plantar Toe Great o Change dressing every other day. Follow-up Appointments Wound #4 Left,Plantar Toe Great o Return Appointment in 1 week. Edema Control Wound #4 Left,Plantar Toe Great o Patient to wear own compression stockings o Elevate legs to the level of the heart and pump ankles as often as possible Off-Loading Wound #4 Left,Plantar Toe Great Elizabeth [name], [name] ([id number]) o Open toe surgical shoe with peg assist. Additional Orders / Instructions Wound #4 Left,Plantar Toe Great o Increase protein intake. o Activity as tolerated Electronic Signature(s) Signed: 11/09/2016 4:52:38 PM By: Regan Lemming BSN, RN Signed: 11/09/2016 4:57:22 PM By: Linton Ham MD Entered By: Regan Lemming on 11/09/2016 10:46:20 Elizabeth [name] ([id number]) -------------------------------------------------------------------------------- Problem List Details Patient Name: Elizabeth [name], [name] Date of Service: 11/09/2016 10:15 AM Medical Record Patient Account Number: [id number] Number: Treating RN: Baruch Gouty, RN, BSN, Rita Jul 04, 1951 [phone] y.o. 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 Date of Service: 11/09/2016 10:15 AM Medical Record Patient Account Number: [id number] Number: Treating RN: Baruch Gouty, RN, BSN, Rita Jul 04, 1951 [phone] y.o. Other Clinician: Date of Birth/Sex: Female) Treating Diavion Labrador Primary Care Physician/Extender: Mila Palmer, LINDA Physician: Referring Physician: Delight Stare Weeks in Treatment: 2 Subjective Chief Complaint Information obtained from Patient Bilateral lower extremity  phlebolymphedema, ulcerations, and cellulitis. 10/26/16. Patient is here today for reviewable wound on her left plantar first toe History of Present Illness (HPI) Pleasant 66 year old with past medical history significant for diabetes (hemoglobin A1c 6.1 in February 2016), chronic venous stasis disease, and breast cancer. She developed bilateral lower extremity ulcerations and cellulitis and was hospitalized at [facility] in early March 2016. Ultrasound showed no evidence for DVT. Treated with IV vancomycin. Discharged on 01/09/2015 on Bactrim and in Unna boots, which she tolerated well. She is ambulating per her baseline. No significant pain. No claudication or rest pain. ABI noncompressible. Palpable DP bilaterally. She does not usually wear compression. Cannot apply compression stockings. No fever or chills. No significant drainage. Blood sugars less than 150. 01/28/2015 -- she has not yet received her appointment for a  vascular studies both arterial and venous duplex. He is otherwise doing well but has noticed some redness of the right lower extremity in an area where it is no open ulcer. The left lower extremity feels better. 02/04/2015 -- she has had her vascular studies done in [place] and she goes back to see the doctor tomorrow. I have asked her to get business card to them so that they can send [place] a report. she is not very compliant with her now for dressing and if it sits down she does not use a call to come and get it fixed. Her hygiene too seems to be rather poor. 02/11/2015 -- we have got the vascular studies done at [facility] and the lower extremity arterial duplex evaluation done on 01/30/2015 revealed there was diffuse vessel wall calcification noted throughout the bilateral lower extremity arterial system there is possible arterial inflow disease. No hemodynamically significant stenosis is present. Bilateral calf arteries were not well visualized. the lower extremity venous duplex study done on 01/30/2015 revealed that the patient had no evidence of DVT on both extremities. There was deep venous reflux involving the left lower extremity. No incompetent bilateral great saphenous veins and right small saphenous vein. She was then seen by Dr. Scot Dock on 02/05/2015. His opinion was that she had adequate arterial flow although there was some evidenc e of calcified disease. However because she had a biphasic Doppler signal in the dorsalis pedis position he did not think it was necessary to do any further arterial workup. He thought it was safe to use mild compression stockings and elevate her legs. He has written her a [place], Shamirah L. ([id number]) prescription for knee-high compression stockings with a gradient of 15-20 mmHg and is encouraged her to wear these. 02/18/2015 is finally got her prescription filled and has brought her stockings of 15-20 mmHg and come ready to use  these. READMISSION 10/26/16; this is a 66 year old woman with type 2 diabetes and according to her a history of peripheral neuropathy. Over the last 6 weeks she has noted a wound on the plantar aspect of her left first toe. There is no obvious precipitating features. She apparently does not have a history of PAD and has had that reviewed previously by vein and vascular in [place]. Her ABI in this clinic however was 1.55. She is been going to her primary physician's office and is been getting DuoDERM which is the primary dressing. She was also on oral antibiotics although I don't think that this was cultured. He tells me she did have a history of wounds previously but is not sure where on her feet. She is wearing ordinary footwear. looking [facility]  she had arterial studies done in March 2016. At that time her ABIs were noncompressible in both legs however Doppler waveforms were multiphasic bilaterally. I do not see that she was actually seen by the physician. She was noted to have diffuse specimen calcification throughout the bilateral lower extremity arterial system. No hemodynamically significant stenosis was present. Bilateral calf arteries were not well visualized which was most likely due to calcifications. The patient also has chronic lower extremity edema. Studies of this in March 2016 showed no evidence of a DVT. As deep vein reflux involving the left lower extremity common femoral, femoral and greater saphenous vein. There was reflux on the right involving the great and small saphenous vein I note that she was seen in this clinic previously I think this was for wounds on her legs. She was discharged in compression which she tells me she still wears for chronic venous insufficiency and lymphedema. She also has a history of breast cancer, venous reflux and lymphedema 11/02/16; the area in question is on the plantar aspect of left great toe. This looks better this  week. Still some dark areas of nonviable tissue required debridement but overall improved. 11/09/16; patient arrives today with wound generally looking better. Has been using silver alginate. Objective Constitutional Sitting or standing Blood Pressure is within target range for patient.. Pulse regular and within target range for patient.[name] Respirations regular, non-labored and within target range.. Temperature is normal and within the target range for the patient.. Vitals Time Taken: 10:22 AM, Height: 63 in, Weight: 191 lbs, BMI: 33.8, Temperature: 98.3 F, Pulse: 63 bpm, Respiratory Rate: 17 breaths/min, Blood Pressure: 139/69 mmHg. General Notes: Wound exam; patient arrives today with surface eschar removed with a #3 curet. Nonviable Elizabeth [name], [name] ([id number]) subcutaneous tissue also removed. Wound continues to progress and looks as though there is a viable surface starting to form. There is no evidence of infection Integumentary (Hair, Skin) Wound #4 status is Open. Original cause of wound was Gradually Appeared. The wound is located on the [employer]. The wound measures 0.1cm length x 0.1cm width x 0.1cm depth; 0.008cm^2 area and 0.001cm^3 volume. The wound is limited to skin breakdown. There is no tunneling or undermining noted. There is a small amount of sanguinous drainage noted. The wound margin is distinct with the outline attached to the wound base. There is small (1-33%) pink, pale granulation within the wound bed. There is a medium (34-66%) amount of necrotic tissue within the wound bed including Eschar and Adherent Slough. The periwound skin appearance exhibited: Dry/Scaly, Moist. The periwound skin appearance did not exhibit: Callus, Crepitus, Excoriation, Fluctuance, Friable, Induration, Localized Edema, Rash, Scarring, Maceration, Atrophie Blanche, Cyanosis, Ecchymosis, Hemosiderin Staining, Mottled, Pallor, Rubor, Erythema. Periwound temperature was noted as No  Abnormality. Assessment Active Problems ICD-10 E11.621 - Type 2 diabetes mellitus with foot ulcer L97.522 - Non-pressure chronic ulcer of other part of left foot with fat layer exposed E11.42 - Type 2 diabetes mellitus with diabetic polyneuropathy Procedures Wound #4 Wound #4 is a Diabetic Wound/Ulcer of the Lower Extremity located on the Left,Plantar Toe Great . There was a Skin/Subcutaneous Tissue Debridement [id number]) debridement with total area of 0.01 sq cm performed by Ricard Dillon, MD. with the following instrument(s): Curette to remove Non-Viable tissue/material including Eschar, Callus, and Subcutaneous after achieving pain control using Lidocaine 4% Topical Solution. A time out was conducted at 10:43, prior to the start of the procedure. A Minimum amount of bleeding was controlled with Pressure. The  procedure was tolerated well with a pain level of 0 throughout and a pain level of 0 following the procedure. Post Debridement Measurements: 0.2cm length x 0.2cm width x 0.1cm depth; 0.003cm^3 volume. Character of Wound/Ulcer Post Debridement is stable. Severity of Tissue Post Debridement is: Fat layer exposed. Post procedure Diagnosis Wound #4: Same as Pre-Procedure Elizabeth [name], [name] ([id number]) Plan Wound Cleansing: Wound #4 Left,Plantar Toe Great: Cleanse wound with mild soap and water Anesthetic: Wound #4 Left,Plantar Toe Great: Topical Lidocaine 4% cream applied to wound bed prior to debridement Primary Wound Dressing: Wound #4 Left,Plantar Toe Great: Aquacel Ag Secondary Dressing: Wound #4 Left,Plantar Toe Great: Gauze and Kerlix/Conform Foam Dressing Change Frequency: Wound #4 Left,Plantar Toe Great: Change dressing every other day. Follow-up Appointments: Wound #4 Left,Plantar Toe Great: Return Appointment in 1 week. Edema Control: Wound #4 Left,Plantar Toe Great: Patient to wear own compression stockings Elevate legs to the level of the heart and  pump ankles as often as possible Off-Loading: Wound #4 Left,Plantar Toe Great: Open toe surgical shoe with peg assist. Additional Orders / Instructions: Wound #4 Left,Plantar Toe Great: Increase protein intake. Activity as tolerated no change to the primary dressing this looks like it is progressing towards closure. Electronic Signature(s) Signed: 11/09/2016 4:57:22 PM By: Linton Ham MD Elizabeth [name] ([id number]) Entered By: Linton Ham on 11/09/2016 11:31:05 Elizabeth [name] ([id number]) -------------------------------------------------------------------------------- SuperBill Details Patient Name: Elizabeth [name] L. Date of Service: 11/09/2016 Medical Record Patient Account Number: [id number] Number: Treating RN: Baruch Gouty, RN, BSN, Rita 1950/12/09 [phone] y.o. 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.42 Type 2 diabetes mellitus with diabetic polyneuropathy Facility Procedures CPT4 Code Description: [id number] - DEB SUBQ TISSUE 20 SQ CM/< ICD-10 Description Diagnosis L97.522 Non-pressure chronic ulcer of other part of left foot E11.621 Type 2 diabetes mellitus with foot ulcer Modifier: with fat lay Quantity: 1 er exposed Physician Procedures CPT4 Code Description: [id number] - WC PHYS SUBQ TISS 20 SQ CM ICD-10 Description Diagnosis L97.522 Non-pressure chronic ulcer of other part of left foot E11.621 Type 2 diabetes mellitus with foot ulcer Modifier: with fat laye Quantity: 1 r exposed Electronic Signature(s) Signed: 11/09/2016 4:57:22 PM By: Linton Ham MD Entered By: Linton Ham on 11/09/2016 11:31:32

## 2016-11-10 NOTE — Progress Notes (Signed)
LANEL, HURTT ([id number]) Visit Report for 11/09/2016 Arrival Information Details Patient Name: Elizabeth [name], Elizabeth [name]. Date of Service: 11/09/2016 10:15 AM Medical Record Patient Account Number: [id number] Number: Treating RN: Baruch Gouty, RN, BSN, Rita [birth year] [phone] y.o. Other Clinician: Date of Birth/Sex: Female) Treating ROBSON, MICHAEL Primary Care Physician/Extender: Mila Palmer, LINDA Physician: Referring Physician: MILES, LINDA Weeks in Treatment: 2 Visit Information History Since Last Visit All ordered tests and consults were completed: No Patient Arrived: Ambulatory Added or deleted any medications: No Arrival Time: 10:21 Any new allergies or adverse reactions: No Accompanied By: self Had a fall or experienced change in No Transfer Assistance: None activities of daily living that may affect Patient Identification Verified: Yes risk of falls: Secondary Verification Process Yes Signs or symptoms of abuse/neglect since last No Completed: visito Patient Requires Transmission-Based No Hospitalized since last visit: No Precautions: Has Dressing in Place as Prescribed: Yes Patient Has Alerts: No Pain Present Now: No Electronic Signature(s) Signed: 11/09/2016 4:52:38 PM By: Regan Lemming BSN, RN Entered By: Regan Lemming on 11/09/2016 10:21:38 [name] ([id number]) -------------------------------------------------------------------------------- Encounter Discharge Information Details Patient Name: Elizabeth [name], [name] Date of Service: 11/09/2016 10:15 AM Medical Record Patient Account Number: [id number] Number: Treating RN: Baruch Gouty, RN, BSN, Rita 09-Feb-1951 [phone] y.o. Other Clinician: Date of Birth/Sex: Female) Treating ROBSON, MICHAEL Primary Care Physician/Extender: Mila Palmer, LINDA Physician: Referring Physician: Gerre Scull in Treatment: 2 Encounter Discharge Information Items Schedule Follow-up Appointment: No Medication Reconciliation  completed No and provided to Patient/Care Thaer Miyoshi: Provided on Clinical Summary of Care: 11/09/2016 Form Type Recipient Paper Patient [place] Electronic Signature(s) Signed: 11/09/2016 10:51:11 AM By: Ruthine Dose Entered By: Ruthine Dose on 11/09/2016 10:51:11 [name] ([id number]) -------------------------------------------------------------------------------- Lower Extremity Assessment Details Patient Name: Elizabeth [name], [name] Date of Service: 11/09/2016 10:15 AM Medical Record Patient Account Number: [id number] Number: Treating RN: Baruch Gouty, RN, BSN, Rita 01-Jul-1951 [phone] y.o. Other Clinician: Date of Birth/Sex: Female) Treating ROBSON, MICHAEL Primary Care Physician/Extender: Mila Palmer, LINDA Physician: Referring Physician: MILES, LINDA Weeks in Treatment: 2 Edema Assessment Assessed: [Left: No] [Right: No] Edema: [Left: N] [Right: o] Calf Left: Right: Point of Measurement: 36 cm From Medial Instep 40.1 cm cm Ankle Left: Right: Point of Measurement: 10 cm From Medial Instep 28.2 cm cm Vascular Assessment Claudication: Claudication Assessment [Left:None] Pulses: Dorsalis Pedis Palpable: [Left:Yes] Posterior Tibial Extremity colors, hair growth, and conditions: Extremity Color: [Left:Mottled] Hair Growth on Extremity: [Left:Yes] Temperature of Extremity: [Left:Warm] Capillary Refill: [Left:< 3 seconds] Electronic Signature(s) Signed: 11/09/2016 4:52:38 PM By: Regan Lemming BSN, RN Entered By: Regan Lemming on 11/09/2016 10:22:30 [name] ([id number]) -------------------------------------------------------------------------------- Multi Wound Chart Details Patient Name: Elizabeth [name], [name] Date of Service: 11/09/2016 10:15 AM Medical Record Patient Account Number: [id number] Number: Treating RN: Baruch Gouty, RN, BSN, Rita [birth year] [phone] y.o. Other Clinician: Date of Birth/Sex: Female) Treating ROBSON, MICHAEL Primary Care Physician/Extender:  Mila Palmer, LINDA Physician: Referring Physician: MILES, LINDA Weeks in Treatment: 2 Vital Signs Height(in): 63 Pulse(bpm): 63 Weight(lbs): 191 Blood Pressure 139/69 (mmHg): Body Mass Index(BMI): 34 Temperature(F): 98.3 Respiratory Rate 17 (breaths/min): Photos: [N/A:N/A] Wound Location: Left Toe Great - Plantar N/A N/A Wounding Event: Gradually Appeared N/A N/A Primary Etiology: Diabetic Wound/Ulcer of N/A N/A the Lower Extremity Comorbid History: Asthma, Hypertension, N/A N/A Peripheral Venous Disease, Type II Diabetes, Neuropathy, Seizure Disorder, Received Radiation Date Acquired: 09/14/2016 N/A N/A Weeks of Treatment: 2 N/A N/A Wound Status: Open N/A N/A Measurements L x W x D 0.1x0.1x0.1 N/A N/A (cm) Area (cm) :  0.008 N/A N/A Volume (cm) : 0.001 N/A N/A % Reduction in Area: 97.10% N/A N/A % Reduction in Volume: 98.20% N/A N/A Classification: Grade 1 N/A N/A Bobrowski, Sammye L. ([id number]) Exudate Amount: Small N/A N/A Exudate Type: Sanguinous N/A N/A Exudate Color: red N/A N/A Wound Margin: Distinct, outline attached N/A N/A Granulation Amount: Small (1-33%) N/A N/A Granulation Quality: Pink, Pale N/A N/A Necrotic Amount: Medium (34-66%) N/A N/A Necrotic Tissue: Eschar, Adherent Slough N/A N/A Exposed Structures: Fascia: No N/A N/A Fat: No Tendon: No Muscle: No Joint: No Bone: No Limited to Skin Breakdown Epithelialization: Medium (34-66%) N/A N/A Debridement: Debridement [id number] N/A N/A [id number]) Pre-procedure 10:43 N/A N/A Verification/Time Out Taken: Pain Control: Lidocaine 4% Topical N/A N/A Solution Tissue Debrided: Necrotic/Eschar, Callus, N/A N/A Subcutaneous Level: Skin/Subcutaneous N/A N/A Tissue Debridement Area (sq 0.01 N/A N/A cm): Instrument: Curette N/A N/A Bleeding: Minimum N/A N/A Hemostasis Achieved: Pressure N/A N/A Procedural Pain: 0 N/A N/A Post Procedural Pain: 0 N/A N/A Debridement Treatment Procedure was tolerated  N/A N/A Response: well Post Debridement 0.2x0.2x0.1 N/A N/A Measurements L x W x D (cm) Post Debridement 0.003 N/A N/A Volume: (cm) Periwound Skin Texture: Edema: No N/A N/A Excoriation: No Induration: No Callus: No Crepitus: No Fluctuance: No Friable: No Belfiore, Khila L. ([id number]) Rash: No Scarring: No Periwound Skin Moist: Yes N/A N/A Moisture: Dry/Scaly: Yes Maceration: No Periwound Skin Color: Atrophie Blanche: No N/A N/A Cyanosis: No Ecchymosis: No Erythema: No Hemosiderin Staining: No Mottled: No Pallor: No Rubor: No Temperature: No Abnormality N/A N/A Tenderness on No N/A N/A Palpation: Wound Preparation: Ulcer Cleansing: N/A N/A Rinsed/Irrigated with Saline Topical Anesthetic Applied: Other: lidocaine 4% Procedures Performed: Debridement N/A N/A Treatment Notes Electronic Signature(s) Signed: 11/09/2016 4:57:22 PM By: Linton Ham MD Entered By: Linton Ham on 11/09/2016 11:26:43 [name] ([id number]) -------------------------------------------------------------------------------- [place] Details Patient Name: Elizabeth [name], Dayanira L. Date of Service: 11/09/2016 10:15 AM Medical Record Patient Account Number: [id number] Number: Treating RN: Baruch Gouty, RN, BSN, Rita July 12, 1951 [phone] y.o. Other Clinician: Date of Birth/Sex: Female) Treating ROBSON, MICHAEL Primary Care Physician/Extender: Mila Palmer, LINDA Physician: Referring Physician: MILES, LINDA Weeks in Treatment: 2 Active Inactive Abuse / Safety / Falls / Self Care Management Nursing Diagnoses: Impaired home maintenance Impaired physical mobility Self care deficit: actual or potential Goals: Patient will remain injury free Date Initiated: 10/26/2016 Goal Status: Active Patient/caregiver will verbalize understanding of skin care regimen Date Initiated: 10/26/2016 Goal Status: Active Patient/caregiver will verbalize/demonstrate measure taken to  improve self care Date Initiated: 10/26/2016 Goal Status: Active Patient/caregiver will verbalize/demonstrate measures taken to improve the patient's personal safety Date Initiated: 10/26/2016 Goal Status: Active Patient/caregiver will verbalize/demonstrate measures taken to prevent injury and/or falls Date Initiated: 10/26/2016 Goal Status: Active Patient/caregiver will verbalize/demonstrate understanding of what to do in case of emergency Date Initiated: 10/26/2016 Goal Status: Active Interventions: Assess fall risk on admission and as needed Assess: immobility, friction, shearing, incontinence upon admission and as needed Assess impairment of mobility on admission and as needed per policy Assess self care needs on admission and as needed Haigler, Mauria L. ([id number]) Patient referred to community resources (specify in notes) Provide education on basic hygiene Provide education on fall prevention Provide education on personal and home safety Provide education on safe transfers Treatment Activities: Education provided on Basic Hygiene : 11/02/2016 Notes: Orientation to the Wound Care Program Nursing Diagnoses: Knowledge deficit related to the wound healing center program Goals: Patient/caregiver will verbalize understanding of the [place] Date Initiated:  10/26/2016 Goal Status: Active Interventions: Provide education on orientation to the wound center Notes: Wound/Skin Impairment Nursing Diagnoses: Impaired tissue integrity Knowledge deficit related to ulceration/compromised skin integrity Goals: Patient/caregiver will verbalize understanding of skin care regimen Date Initiated: 10/26/2016 Goal Status: Active Ulcer/skin breakdown will have a volume reduction of 30% by week 4 Date Initiated: 10/26/2016 Goal Status: Active Ulcer/skin breakdown will have a volume reduction of 50% by week 8 Date Initiated: 10/26/2016 Goal Status: Active Ulcer/skin  breakdown will have a volume reduction of 80% by week 12 Date Initiated: 10/26/2016 Goal Status: Active Ulcer/skin breakdown will heal within 14 weeks Saathoff, Denisha L. ([id number]) Date Initiated: 10/26/2016 Goal Status: Active Interventions: Assess patient/caregiver ability to obtain necessary supplies Assess patient/caregiver ability to perform ulcer/skin care regimen upon admission and as needed Assess ulceration(s) every visit Provide education on ulcer and skin care Treatment Activities: Referred to DME Marijo Quizon for dressing supplies : 10/26/2016 Skin care regimen initiated : 10/26/2016 Topical wound management initiated : 10/26/2016 Notes: Electronic Signature(s) Signed: 11/09/2016 4:52:38 PM By: Regan Lemming BSN, RN Entered By: Regan Lemming on 11/09/2016 10:43:32 [name] ([id number]) -------------------------------------------------------------------------------- Pain Assessment Details Patient Name: Elizabeth [name], Josefa L. Date of Service: 11/09/2016 10:15 AM Medical Record Patient Account Number: [id number] Number: Treating RN: Baruch Gouty, RN, BSN, Rita 08-24-1951 [phone] y.o. Other Clinician: Date of Birth/Sex: Female) Treating ROBSON, MICHAEL Primary Care Physician/Extender: Mila Palmer, LINDA Physician: Referring Physician: MILES, LINDA Weeks in Treatment: 2 Active Problems Location of Pain Severity and Description of Pain Patient Has Paino No Site Locations With Dressing Change: No Pain Management and Medication Current Pain Management: Electronic Signature(s) Signed: 11/09/2016 4:52:38 PM By: Regan Lemming BSN, RN Entered By: Regan Lemming on 11/09/2016 10:21:51 [name] ([id number]) -------------------------------------------------------------------------------- Wound Assessment Details Patient Name: Elizabeth [name], Shaindy L. Date of Service: 11/09/2016 10:15 AM Medical Record Patient Account Number: [id number] Number: Treating RN: Baruch Gouty, RN, BSN,  Rita [birth year] [phone] y.o. Other Clinician: Date of Birth/Sex: Female) Treating ROBSON, MICHAEL Primary Care Physician/Extender: Mila Palmer, LINDA Physician: Referring Physician: MILES, LINDA Weeks in Treatment: 2 Wound Status Wound Number: 4 Primary Diabetic Wound/Ulcer of the Lower Etiology: Extremity Wound Location: Left Toe Great - Plantar Wound Open Wounding Event: Gradually Appeared Status: Date Acquired: 09/14/2016 Comorbid Asthma, Hypertension, Peripheral Weeks Of Treatment: 2 History: Venous Disease, Type II Diabetes, Clustered Wound: No Neuropathy, Seizure Disorder, Received Radiation Photos Photo Uploaded By: Regan Lemming on 11/09/2016 11:02:54 Wound Measurements Length: (cm) 0.1 Width: (cm) 0.1 Depth: (cm) 0.1 Area: (cm) 0.008 Volume: (cm) 0.001 % Reduction in Area: 97.1% % Reduction in Volume: 98.2% Epithelialization: Medium (34-66%) Tunneling: No Undermining: No Wound Description Classification: Grade 1 Wound Margin: Distinct, outline attached Exudate Amount: Small Exudate Type: Sanguinous Exudate Color: red Foul Odor After Cleansing: No Wound Bed Fuster, Annsley L. ([id number]) Granulation Amount: Small (1-33%) Exposed Structure Granulation Quality: Pink, Pale Fascia Exposed: No Necrotic Amount: Medium (34-66%) Fat Layer Exposed: No Necrotic Quality: Eschar, Adherent Slough Tendon Exposed: No Muscle Exposed: No Joint Exposed: No Bone Exposed: No Limited to Skin Breakdown Periwound Skin Texture Texture Color No Abnormalities Noted: No No Abnormalities Noted: No Callus: No Atrophie Blanche: No Crepitus: No Cyanosis: No Excoriation: No Ecchymosis: No Fluctuance: No Erythema: No Friable: No Hemosiderin Staining: No Induration: No Mottled: No Localized Edema: No Pallor: No Rash: No Rubor: No Scarring: No Temperature / Pain Moisture Temperature: No Abnormality No Abnormalities Noted: No Dry / Scaly: Yes Maceration: No Moist:  Yes Wound Preparation Ulcer Cleansing: Rinsed/Irrigated with Saline Topical  Anesthetic Applied: Other: lidocaine 4%, Electronic Signature(s) Signed: 11/09/2016 4:52:38 PM By: Regan Lemming BSN, RN Entered By: Regan Lemming on 11/09/2016 10:43:28 [name] ([id number]) -------------------------------------------------------------------------------- [place] Details Patient Name: [name] Date of Service: 11/09/2016 10:15 AM Medical Record Patient Account Number: [id number] Number: Treating RN: Baruch Gouty, RN, BSN, Rita 07/26/1951 [phone] y.o. Other Clinician: Date of Birth/Sex: Female) Treating ROBSON, MICHAEL Primary Care Physician/Extender: Mila Palmer, LINDA Physician: Referring Physician: MILES, LINDA Weeks in Treatment: 2 Vital Signs Time Taken: 10:22 Temperature (F): 98.3 Height (in): 63 Pulse (bpm): 63 Weight (lbs): 191 Respiratory Rate (breaths/min): 17 Body Mass Index (BMI): 33.8 Blood Pressure (mmHg): 139/69 Reference Range: 80 - 120 mg / dl Electronic Signature(s) Signed: 11/09/2016 4:52:38 PM By: Regan Lemming BSN, RN Entered By: Regan Lemming on 11/09/2016 10:24:33

## 2016-11-16 ENCOUNTER — Ambulatory Visit: Payer: Medicare HMO | Admitting: Internal Medicine

## 2016-11-22 NOTE — Progress Notes (Signed)
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  INTERVAL HISTORY: Patient returns to clinic for 6 month follow up of breast cancer. She currently feels well and is asymptomatic. She continues to tolerate letrozole without significant side effects. She is no neurologic complaints. She denies any recent fevers or illnesses. She has good appetite and denies weight loss. She denies any pain. She denies any chest pain or shortness of breath. She has no nausea, vomiting, constipation, or diarrhea. She has no urinary complaints. Patient feels at her baseline and offers no specific complaints today.    REVIEW OF SYSTEMS:   Review of Systems  Constitutional: Negative.  Negative for fever, malaise/fatigue and weight loss.  Respiratory: Negative.  Negative for cough and shortness of breath.   Cardiovascular: Negative.  Negative for chest pain and leg swelling.  Gastrointestinal: Negative.  Negative for abdominal pain.  Genitourinary: Negative.   Musculoskeletal: Negative.   Neurological: Negative.  Negative for sensory change and weakness.  Psychiatric/Behavioral: Negative.  The patient is not nervous/anxious.     As per HPI. Otherwise, a complete review of systems is negative.   PAST MEDICAL HISTORY: Past Medical History:  Diagnosis Date  . Cancer [facility])    breast  . Carcinoma of right breast ([place]) 05/20/2014  . Diabetes mellitus without complication ([place])   . Hypertension   . Lymphedema   . PONV (postoperative nausea and vomiting)   . Psoriasis 1990  . Stroke ([place]) 2000   residual left arm weakness  . Varicose veins     PAST SURGICAL  HISTORY: Past Surgical History:  Procedure Laterality Date  . BREAST EXCISIONAL BIOPSY Right 2015   positive, with radiation  . BREAST LUMPECTOMY Right 04-2014   followed by radiation,  no chemo  . BREAST REDUCTION SURGERY  1977  . CHOLECYSTECTOMY N/A 09/23/2015   Procedure: LAPAROSCOPIC CHOLECYSTECTOMY;  Surgeon: Bonner Puna, MD;  Location: [facility] ORS;  Service: General;  Laterality: N/A;  . DILATION AND CURETTAGE OF UTERUS  2010  . EYE SURGERY Left 2015   cataracts and left eye detached retina repair  . EYE SURGERY Right 2013   cataract with len implant  . GASTRIC ROUX-EN-Y N/A 09/23/2015   Procedure: LAPAROSCOPIC ROUX-EN-Y GASTRIC;  Surgeon: Bonner Puna, MD;  Location: [facility] ORS;  Service: General;  Laterality: N/A;  . HIATAL HERNIA REPAIR N/A 09/23/2015   Procedure: LAPAROSCOPIC REPAIR OF HIATAL HERNIA;  Surgeon: Bonner Puna, MD;  Location: [facility] ORS;  Service: General;  Laterality: N/A;  . REDUCTION MAMMAPLASTY      FAMILY HISTORY Family History  Problem Relation Age of Onset  . Diabetes Mother   . Cancer Mother   . Breast cancer Mother 78  . Diabetes Father   . Diabetes Sister   . Diabetes Brother     GYNECOLOGIC HISTORY:  No LMP recorded. Patient is postmenopausal.     ADVANCED DIRECTIVES:    HEALTH MAINTENANCE: Social History  Substance Use Topics  . Smoking status: Never Smoker  . Smokeless tobacco: Not on file  . Alcohol use No    No Known Allergies  Current Outpatient Prescriptions  Medication Sig Dispense  Refill  . albuterol (PROVENTIL HFA;VENTOLIN HFA) 108 (90 BASE) MCG/ACT inhaler Inhale 2 puffs into the lungs every 6 (six) hours as needed for wheezing or shortness of breath.    [name] aspirin 81 MG tablet Take 81 mg by mouth daily. Reported on 05/25/2016    . atenolol (TENORMIN) 50 MG tablet Take 50 mg by mouth daily. Reported on 05/25/2016    . brimonidine (ALPHAGAN) 0.2 % ophthalmic solution Place 1 drop into the left eye 2 (two) times daily. Reported on  05/25/2016    . Calcium-Vitamin D (CALTRATE 600 PLUS-VIT D PO) Take 2 tablets by mouth every morning.     . clopidogrel (PLAVIX) 75 MG tablet Take 75 mg by mouth.    . gabapentin (NEURONTIN) 300 MG capsule Take 300 mg by mouth 3 (three) times daily.    . insulin lispro (HUMALOG) 100 UNIT/ML injection Inject into the skin 3 (three) times daily before meals. Reported on 05/25/2016    . letrozole (FEMARA) 2.5 MG tablet Take 1 tablet (2.5 mg total) by mouth daily. 90 tablet 1  . pravastatin (PRAVACHOL) 40 MG tablet Take 40 mg by mouth daily. In afternoon    . ranitidine (ZANTAC) 150 MG tablet Take 150 mg by mouth 2 (two) times daily. Reported on 05/25/2016    . timolol (BETIMOL) 0.5 % ophthalmic solution Place 1 drop into the left eye 2 (two) times daily. Reported on 05/25/2016     No current facility-administered medications for this visit.     OBJECTIVE: BP (!) 149/83 (BP Location: Left Arm, Patient Position: Sitting)   Pulse 65   Temp 97.4 F (36.3 C) (Tympanic)   Resp 18   Wt 189 lb 13.1 oz (86.1 kg)   BMI 33.62 kg/m    Body mass index is 33.62 kg/m.    ECOG FS:0 - Asymptomatic  General: Well-developed, well-nourished, no acute distress. Eyes: Pink conjunctiva, anicteric sclera. Breasts: Bilateral breast and axilla without lumps or masses. Lungs: Clear to auscultation bilaterally. Heart: Regular rate and rhythm. No rubs, murmurs, or gallops. Abdomen: Soft, nontender, nondistended. No organomegaly noted, normoactive bowel sounds. Breast: Patient declined breast exam. Musculoskeletal: No edema, cyanosis, or clubbing. Neuro: Alert, answering all questions appropriately. Cranial nerves grossly intact. Skin: No rashes or petechiae noted. Psych: Normal affect.   LAB RESULTS:  No visits with results within 3 Day(s) from this visit.  Latest known visit with results is:  Appointment on 05/25/2016  Component Date Value Ref Range Status  . WBC 05/25/2016 9.4  3.6 - 11.0 K/uL Final  . RBC  05/25/2016 4.43  3.80 - 5.20 MIL/uL Final  . Hemoglobin 05/25/2016 12.9  12.0 - 16.0 g/dL Final  . HCT 05/25/2016 38.1  35.0 - 47.0 % Final  . MCV 05/25/2016 86.0  80.0 - 100.0 fL Final  . MCH 05/25/2016 29.1  26.0 - 34.0 pg Final  . MCHC 05/25/2016 33.9  32.0 - 36.0 g/dL Final  . RDW 05/25/2016 13.7  11.5 - 14.5 % Final  . Platelets 05/25/2016 226  150 - 440 K/uL Final  . Neutrophils Relative % 05/25/2016 75  % Final  . Neutro Abs 05/25/2016 7.1* 1.4 - 6.5 K/uL Final  . Lymphocytes Relative 05/25/2016 16  % Final  . Lymphs Abs 05/25/2016 1.5  1.0 - 3.6 K/uL Final  . Monocytes Relative 05/25/2016 6  % Final  . Monocytes Absolute 05/25/2016 0.6  0.2 - 0.9 K/uL Final  . Eosinophils Relative 05/25/2016 2  % Final  .  Eosinophils Absolute 05/25/2016 0.2  0 - 0.7 K/uL Final  . Basophils Relative 05/25/2016 1  % Final  . Basophils Absolute 05/25/2016 0.1  0 - 0.1 K/uL Final  . Sodium 05/25/2016 139  135 - 145 mmol/L Final  . Potassium 05/25/2016 4.3  3.5 - 5.1 mmol/L Final  . Chloride 05/25/2016 105  101 - 111 mmol/L Final  . CO2 05/25/2016 28  22 - 32 mmol/L Final  . Glucose, Bld 05/25/2016 162* 65 - 99 mg/dL Final  . BUN 05/25/2016 34* 6 - 20 mg/dL Final  . Creatinine, Ser 05/25/2016 1.09* 0.44 - 1.00 mg/dL Final  . Calcium 05/25/2016 9.2  8.9 - 10.3 mg/dL Final  . Total Protein 05/25/2016 6.9  6.5 - 8.1 g/dL Final  . Albumin 05/25/2016 4.1  3.5 - 5.0 g/dL Final  . AST 05/25/2016 17  15 - 41 U/L Final  . ALT 05/25/2016 15  14 - 54 U/L Final  . Alkaline Phosphatase 05/25/2016 90  38 - 126 U/L Final  . Total Bilirubin 05/25/2016 0.6  0.3 - 1.2 mg/dL Final  . GFR calc non Af Amer 05/25/2016 52* >60 mL/min Final  . GFR calc Af Amer 05/25/2016 >60  >60 mL/min Final   Comment: (NOTE) The eGFR has been calculated using the CKD EPI equation. This calculation has not been validated in all clinical situations. eGFR's persistently <60 mL/min signify possible Chronic Kidney Disease.   . Anion  gap 05/25/2016 6  5 - 15 Final    STUDIES: No results found.  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   & PLAN:    1. 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 Continue letrozole for total 5 years completing in July 2020. Her most recent mammogram on April 13, 2016 was reported as BI-RADS 2. Repeat in June 2018. Return to clinic in 6 months for further evaluation. 2. Postmenopausal: Patient's most recent bone mineral density on April 13, 2016 reported T score of -0.1 which is normal. Repeat in 2 years in June 2019.   Patient expressed understanding and was in agreement with this plan. She also understands that She can call clinic at any time with any questions, concerns, or complaints.    Lloyd Huger, MD   11/23/2016 11:28 AM

## 2016-11-23 ENCOUNTER — Encounter: Payer: Medicare HMO | Admitting: Internal Medicine

## 2016-11-23 ENCOUNTER — Inpatient Hospital Stay: Payer: Medicare HMO | Attending: Oncology | Admitting: Oncology

## 2016-11-23 VITALS — BP 149/83 | HR 65 | Temp 97.4°F | Resp 18 | Wt 189.8 lb

## 2016-11-23 DIAGNOSIS — I890 Lymphedema, not elsewhere classified: Secondary | ICD-10-CM | POA: Diagnosis not present

## 2016-11-23 DIAGNOSIS — Z7982 Long term (current) use of aspirin: Secondary | ICD-10-CM

## 2016-11-23 DIAGNOSIS — C50511 Malignant neoplasm of lower-outer quadrant of right female breast: Secondary | ICD-10-CM | POA: Diagnosis present

## 2016-11-23 DIAGNOSIS — E119 Type 2 diabetes mellitus without complications: Secondary | ICD-10-CM | POA: Insufficient documentation

## 2016-11-23 DIAGNOSIS — I10 Essential (primary) hypertension: Secondary | ICD-10-CM | POA: Diagnosis not present

## 2016-11-23 DIAGNOSIS — Z803 Family history of malignant neoplasm of breast: Secondary | ICD-10-CM | POA: Insufficient documentation

## 2016-11-23 DIAGNOSIS — Z794 Long term (current) use of insulin: Secondary | ICD-10-CM | POA: Diagnosis not present

## 2016-11-23 DIAGNOSIS — Z8673 Personal history of transient ischemic attack (TIA), and cerebral infarction without residual deficits: Secondary | ICD-10-CM | POA: Diagnosis not present

## 2016-11-23 DIAGNOSIS — Z79811 Long term (current) use of aromatase inhibitors: Secondary | ICD-10-CM | POA: Diagnosis not present

## 2016-11-23 DIAGNOSIS — E11621 Type 2 diabetes mellitus with foot ulcer: Secondary | ICD-10-CM | POA: Diagnosis not present

## 2016-11-23 DIAGNOSIS — Z170 Estrogen receptor positive status [ER+]: Secondary | ICD-10-CM | POA: Diagnosis not present

## 2016-11-23 MED ORDER — CALCIUM CARBONATE-VITAMIN D 500-200 MG-UNIT PO TABS: 2.0000 | ORAL_TABLET | Freq: Every day | ORAL | 3 refills | Status: DC

## 2016-11-23 MED ORDER — LETROZOLE 2.5 MG PO TABS: 2.5000 mg | ORAL_TABLET | Freq: Every day | ORAL | 3 refills | Status: DC

## 2016-11-23 NOTE — Progress Notes (Signed)
Offers no complaints. Feeling well. 

## 2016-11-24 NOTE — Progress Notes (Signed)
RHYEN, MCMICKENS ([id number]) Visit Report for 11/23/2016 Chief Complaint Document Details Patient Name: Elizabeth [name], [name] Date of Service: 11/23/2016 10:30 AM Medical Record Patient Account Number: [id number] Number: Treating RN: Baruch Gouty, RN, BSN, Rita 1951/06/28 [phone] y.o. Other Clinician: Date of Birth/Sex: Female) Treating ROBSON, MICHAEL Primary Care Physician/Extender: Mila Palmer, LINDA Physician: Referring Physician: MILES, LINDA Weeks in Treatment: 4 Information Obtained from: Patient Chief Complaint Bilateral lower extremity phlebolymphedema, ulcerations, and cellulitis. 10/26/16. Patient is here today for reviewable wound on her left plantar first toe Electronic Signature(s) Signed: 11/24/2016 7:43:54 AM By: Linton Ham MD Entered By: Linton Ham on 11/23/2016 10:42:29 Elizabeth [name] ([id number]) -------------------------------------------------------------------------------- HPI Details Patient Name: Elizabeth Kern L. Date of Service: 11/23/2016 10:30 AM Medical Record Patient Account Number: [id number] Number: Treating RN: Baruch Gouty, RN, BSN, Rita [birth year] [phone] y.o. Other Clinician: Date of Birth/Sex: Female) Treating ROBSON, MICHAEL Primary Care Physician/Extender: Mila Palmer, LINDA Physician: Referring Physician: Gerre Scull in Treatment: 4 History of Present Illness HPI Description: Pleasant 66 year old with past medical history significant for diabetes (hemoglobin A1c 6.1 in February 2016), chronic venous stasis disease, and breast cancer. She developed bilateral lower extremity ulcerations and cellulitis and was hospitalized at [facility] in early March 2016. Ultrasound showed no evidence for DVT. Treated with IV vancomycin. Discharged on 01/09/2015 on Bactrim and in Unna boots, which she tolerated well. She is ambulating per her baseline. No significant pain. No claudication or rest pain. ABI  noncompressible. Palpable DP bilaterally. She does not usually wear compression. Cannot apply compression stockings. No fever or chills. No significant drainage. Blood sugars less than 150. 01/28/2015 -- she has not yet received her appointment for a vascular studies both arterial and venous duplex. He is otherwise doing well but has noticed some redness of the right lower extremity in an area where it is no open ulcer. The left lower extremity feels better. 02/04/2015 -- she has had her vascular studies done in [place] and she goes back to see the doctor tomorrow. I have asked her to get business card to them so that they can send [place] a report. she is not very compliant with her now for dressing and if it sits down she does not use a call to come and get it fixed. Her hygiene too seems to be rather poor. 02/11/2015 -- we have got the vascular studies done at [facility] and the lower extremity arterial duplex evaluation done on 01/30/2015 revealed there was diffuse vessel wall calcification noted throughout the bilateral lower extremity arterial system there is possible arterial inflow disease. No hemodynamically significant stenosis is present. Bilateral calf arteries were not well visualized. the lower extremity venous duplex study done on 01/30/2015 revealed that the patient had no evidence of DVT on both extremities. There was deep venous reflux involving the left lower extremity. No incompetent bilateral great saphenous veins and right small saphenous vein. She was then seen by Dr. Scot Dock on 02/05/2015. His opinion was that she had adequate arterial flow although there was some evidenc e of calcified disease. However because she had a biphasic Doppler signal in the dorsalis pedis position he did not think it was necessary to do any further arterial workup. He thought it was safe to use mild compression stockings and elevate her legs. He has written her a prescription for knee-high  compression stockings with a gradient of 15-20 mmHg and is encouraged her to wear these. 02/18/2015 is finally got her prescription filled and has brought her  stockings of 15-20 mmHg and come ready to use these. Elizabeth [name], Elizabeth [name] ([id number]) 10/26/16; this is a 67 year old woman with type 2 diabetes and according to her a history of peripheral neuropathy. Over the last 6 weeks she has noted a wound on the plantar aspect of her left first toe. There is no obvious precipitating features. She apparently does not have a history of PAD and has had that reviewed previously by vein and vascular in [place]. Her ABI in this clinic however was 1.55. She is been going to her primary physician's office and is been getting DuoDERM which is the primary dressing. She was also on oral antibiotics although I don't think that this was cultured. He tells me she did have a history of wounds previously but is not sure where on her feet. She is wearing ordinary footwear. [facility] she had arterial studies done in March 2016. At that time her ABIs were noncompressible in both legs however Doppler waveforms were multiphasic bilaterally. I do not see that she was actually seen by the physician. She was noted to have diffuse specimen calcification throughout the bilateral lower extremity arterial system. No hemodynamically significant stenosis was present. Bilateral calf arteries were not well visualized which was most likely due to calcifications. The patient also has chronic lower extremity edema. Studies of this in March 2016 showed no evidence of a DVT. As deep vein reflux involving the left lower extremity common femoral, femoral and greater saphenous vein. There was reflux on the right involving the great and small saphenous vein I note that she was seen in this clinic previously I think this was for wounds on her legs. She was discharged in compression which she tells me  she still wears for chronic venous insufficiency and lymphedema. She also has a history of breast cancer, venous reflux and lymphedema 11/02/16; the area in question is on the plantar aspect of left great toe. This looks better this week. Still some dark areas of nonviable tissue required debridement but overall improved. 11/09/16; patient arrives today with wound generally looking better. Has been using silver alginate. 11/23/16; patient arrives today with her wound totally epithelialized. This is on the plantar aspect of her left first toe. He does not have diabetic foot where. Has a Medicare replacement policy through [employer] and I've advised her to look at the policy to see if they would cover diabetic shoes with custom inserts. Electronic Signature(s) Signed: 11/24/2016 7:43:54 AM By: Linton Ham MD Entered By: Linton Ham on 11/23/2016 10:43:34 Elizabeth [name] ([id number]) -------------------------------------------------------------------------------- Physical Exam Details Patient Name: Elizabeth [name], [name] Date of Service: 11/23/2016 10:30 AM Medical Record Patient Account Number: [id number] Number: Treating RN: Baruch Gouty, RN, BSN, Rita [birth year] [phone] y.o. Other Clinician: Date of Birth/Sex: Female) Treating ROBSON, MICHAEL Primary Care Physician/Extender: Mila Palmer, LINDA Physician: Referring Physician: MILES, LINDA Weeks in Treatment: 4 Constitutional Patient is hypertensive.. Pulse regular and within target range for patient.[name] Respirations regular, non-labored and within target range.. Temperature is normal and within the target range for the patient.. Patient's appearance is neat and clean. Appears in no acute distress. Well nourished and well developed.. Notes Exam; patient arrives today with no open area. The surface appears to be viable Electronic Signature(s) Signed: 11/24/2016 7:43:54 AM By: Linton Ham MD Entered By: Linton Ham on 11/23/2016  10:44:18 Elizabeth [name] ([id number]) -------------------------------------------------------------------------------- Physician Orders Details Patient Name: Elizabeth [name]. Date of Service: 11/23/2016 10:30 AM Medical Record Patient Account Number: [id number]  [id number] Number: Treating RN: Baruch Gouty, RN, BSN, Rita 21-Jun-1951 (66 y.o. Other Clinician: Date of Birth/Sex: Female) Treating ROBSON, MICHAEL Primary Care Physician/Extender: Mila Palmer, LINDA Physician: Referring Physician: Gerre Scull in Treatment: 4 Verbal / Phone Orders: Yes Clinician: Afful, RN, BSN, Rita Read Back and Verified: Yes Diagnosis Coding Off-Loading o Open toe surgical shoe to: Discharge From [facility] o Discharge from [place] Completed Electronic Signature(s) Signed: 11/23/2016 4:59:50 PM By: Regan Lemming BSN, RN Signed: 11/24/2016 7:43:54 AM By: Linton Ham MD Entered By: Regan Lemming on 11/23/2016 10:38:21 Elizabeth [name] ([id number]) -------------------------------------------------------------------------------- Problem List Details Patient Name: Elizabeth [name], [name] Date of Service: 11/23/2016 10:30 AM Medical Record Patient Account Number: [id number] Number: Treating RN: Baruch Gouty, RN, BSN, Rita 05-Nov-1951 [phone] y.o. 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 Date of Service: 11/23/2016 10:30 AM Medical Record Patient Account Number: [id number] Number: Treating RN: Baruch Gouty, RN, BSN, Rita July 09, 1951 [phone] y.o. Other Clinician: Date of Birth/Sex: Female) Treating ROBSON, MICHAEL Primary Care Physician/Extender: Mila Palmer, LINDA Physician: Referring Physician: MILES, LINDA Weeks in Treatment: 4 Subjective Chief Complaint Information obtained from Patient Bilateral lower extremity phlebolymphedema, ulcerations, and cellulitis. 10/26/16. Patient is here today for reviewable wound on her left plantar first toe History of Present Illness (HPI) Pleasant 66 year old with past medical history significant for diabetes (hemoglobin A1c 6.1 in February 2016), chronic venous stasis disease, and breast cancer. She developed bilateral lower extremity ulcerations and cellulitis and was hospitalized at [facility] in early March 2016. Ultrasound showed no evidence for DVT. Treated with IV vancomycin. Discharged on 01/09/2015 on Bactrim and in Unna boots, which she tolerated well. She is ambulating per her baseline. No significant pain. No claudication or rest pain. ABI noncompressible. Palpable DP bilaterally. She does not usually wear compression. Cannot apply compression stockings. No fever or chills. No significant drainage. Blood sugars less than 150. 01/28/2015 -- she has not yet received her appointment for a vascular studies both arterial and venous duplex. He is otherwise doing well but has noticed some redness of the right lower extremity in an area where it is no open ulcer. The left lower extremity feels better. 02/04/2015 -- she has had her vascular studies done in [place] and she goes back to see the doctor tomorrow. I have  asked her to get business card to them so that they can send [place] a report. she is not very compliant with her now for dressing and if it sits down she does not use a call to come and get it fixed. Her hygiene too seems to be rather poor. 02/11/2015 -- we have got the vascular studies done at [facility] and the lower extremity arterial duplex evaluation done on 01/30/2015 revealed there was diffuse vessel wall calcification noted throughout the bilateral lower extremity arterial system there is possible arterial inflow disease. No hemodynamically significant stenosis is present. Bilateral calf arteries were not well  visualized. the lower extremity venous duplex study done on 01/30/2015 revealed that the patient had no evidence of DVT on both extremities. There was deep venous reflux involving the left lower extremity. No incompetent bilateral great saphenous veins and right small saphenous vein. She was then seen by Dr. Scot Dock on 02/05/2015. His opinion was that she had adequate arterial flow although there was some evidenc e of calcified disease. However because she had a biphasic Doppler signal in the dorsalis pedis position he did not think it was necessary to do any further arterial workup. He thought it was safe to use mild compression stockings and elevate her legs. He has written her a Elizabeth [name], [name] ([id number]) prescription for knee-high compression stockings with a gradient of 15-20 mmHg and is encouraged her to wear these. 02/18/2015 is finally got her prescription filled and has brought her stockings of 15-20 mmHg and come ready to use these. READMISSION 10/26/16; this is a 66 year old woman with type 2 diabetes and according to her a history of peripheral neuropathy. Over the last 6 weeks she has noted a wound on the plantar aspect of her left first toe. There is no obvious precipitating features. She apparently does not have a history of PAD and has had that reviewed previously  by vein and vascular in [place]. Her ABI in this clinic however was 1.55. She is been going to her primary physician's office and is been getting DuoDERM which is the primary dressing. She was also on oral antibiotics although I don't think that this was cultured. He tells me she did have a history of wounds previously but is not sure where on her feet. She is wearing ordinary footwear. looking [facility] she had arterial studies done in March 2016. At that time her ABIs were noncompressible in both legs however Doppler waveforms were multiphasic bilaterally. I do not see that she was actually seen by the physician. She was noted to have diffuse specimen calcification throughout the bilateral lower extremity arterial system. No hemodynamically significant stenosis was present. Bilateral calf arteries were not well visualized which was most likely due to calcifications. The patient also has chronic lower extremity edema. Studies of this in March 2016 showed no evidence of a DVT. As deep vein reflux involving the left lower extremity common femoral, femoral and greater saphenous vein. There was reflux on the right involving the great and small saphenous vein I note that she was seen in this clinic previously I think this was for wounds on her legs. She was discharged in compression which she tells me she still wears for chronic venous insufficiency and lymphedema. She also has a history of breast cancer, venous reflux and lymphedema 11/02/16; the area in question is on the plantar aspect of left great toe. This looks better this week. Still some dark areas of nonviable tissue required debridement but overall improved. 11/09/16; patient arrives today with wound generally looking better. Has been using silver alginate. 11/23/16; patient arrives today with her wound totally epithelialized. This is on the plantar aspect of her left first toe. He does not have diabetic foot where. Has a  Medicare replacement policy through [place] and I've advised her to look at the policy to see if they would cover diabetic shoes with custom inserts. Objective Constitutional Patient is hypertensive.. Pulse regular and within target range for patient.[name] Respirations regular, non-labored and within target range.. Temperature is normal and within the target range for the patient.. Patient's appearance is neat and  clean. Appears in no acute distress. Well nourished and well developed.. Vitals Time Taken: 10:28 AM, Height: 63 in, Weight: 191 lbs, BMI: 33.8, Temperature: 98 F, Pulse: 61 bpm, Respiratory Rate: 16 breaths/min, Blood Pressure: 167/74 mmHg. Elizabeth [name], Elizabeth [name] ([id number]) General Notes: Exam; patient arrives today with no open area. The surface appears to be viable Integumentary (Hair, Skin) Wound #4 status is Healed - Epithelialized. Original cause of wound was Gradually Appeared. The wound is located on the [employer]. The wound measures 0cm length x 0cm width x 0cm depth; 0cm^2 area and 0cm^3 volume. The wound is limited to skin breakdown. There is no tunneling or undermining noted. There is a none present amount of drainage noted. The wound margin is distinct with the outline attached to the wound base. There is no granulation within the wound bed. There is no necrotic tissue within the wound bed. The periwound skin appearance exhibited: Dry/Scaly. The periwound skin appearance did not exhibit: Callus, Crepitus, Excoriation, Fluctuance, Friable, Induration, Localized Edema, Rash, Scarring, Maceration, Moist, Atrophie Blanche, Cyanosis, Ecchymosis, Hemosiderin Staining, Mottled, Pallor, Rubor, Erythema. Periwound temperature was noted as No Abnormality. Assessment Active Problems ICD-10 E11.621 - Type 2 diabetes mellitus with foot ulcer L97.522 - Non-pressure chronic ulcer of other part of left foot with fat layer exposed E11.42 - Type 2 diabetes mellitus with diabetic  polyneuropathy Plan Off-Loading: Open toe surgical shoe to: Discharge From [facility] Services: Discharge from [place] Completed I have advised the patient to keep the plantar first toe on the left offloaded with a thick bandaide check her insureance wrt coverage of diabetic shoes Elizabeth [name], Elizabeth [name] ([id number]) Electronic Signature(s) Signed: 11/24/2016 7:43:54 AM By: Linton Ham MD Entered By: Linton Ham on 11/23/2016 10:45:32 Elizabeth [name], Elizabeth [name] ([id number]) -------------------------------------------------------------------------------- SuperBill Details Patient Name: Elizabeth [name], Aahana L. Date of Service: 11/23/2016 Medical Record Patient Account Number: [id number] Number: Treating RN: Baruch Gouty, RN, BSN, Rita Apr 02, 1951 [phone] y.o. Other Clinician: Date of Birth/Sex: Female) Treating ROBSON, MICHAEL Primary Care Physician/Extender: Mila Palmer, LINDA Physician: Weeks in Treatment: 4 Referring Physician: Delight Stare Diagnosis Coding ICD-10 Codes Code Description E11.621 Type 2 diabetes mellitus with foot ulcer L97.522 Non-pressure chronic ulcer of other part of left foot with fat layer exposed E11.42 Type 2 diabetes mellitus with diabetic polyneuropathy Facility Procedures CPT4 Code: [id number] Description: [id number] - WOUND CARE VISIT-LEV 2 EST PT Modifier: Quantity: 1 Physician Procedures CPT4 Code Description: [id number] - WC PHYS LEVEL 2 - EST PT ICD-10 Description Diagnosis E11.621 Type 2 diabetes mellitus with foot ulcer L97.522 Non-pressure chronic ulcer of other part of left foo Modifier: t with fat laye Quantity: 1 r exposed Electronic Signature(s) Signed: 11/24/2016 7:43:54 AM By: Linton Ham MD Entered By: Linton Ham on 11/23/2016 10:46:03

## 2016-11-24 NOTE — Progress Notes (Signed)
ALVINA, BORGEN ([id number]) Visit Report for 11/23/2016 Arrival Information Details Patient Name: Elizabeth [name], Elizabeth [name]. Date of Service: 11/23/2016 10:30 AM Medical Record Patient Account Number: [id number] Number: Treating RN: Baruch Gouty, RN, BSN, Rita 1951/08/10 [phone] y.o. Other Clinician: Date of Birth/Sex: Female) Treating ROBSON, MICHAEL Primary Care Physician/Extender: Mila Palmer, LINDA Physician: Referring Physician: MILES, LINDA Weeks in Treatment: 4 Visit Information History Since Last Visit All ordered tests and consults were completed: No Patient Arrived: Ambulatory Added or deleted any medications: No Arrival Time: 10:26 Any new allergies or adverse reactions: No Accompanied By: self Had a fall or experienced change in No Transfer Assistance: None activities of daily living that may affect Patient Identification Verified: Yes risk of falls: Secondary Verification Process Yes Signs or symptoms of abuse/neglect since last No Completed: visito Patient Requires Transmission-Based No Hospitalized since last visit: No Precautions: Has Dressing in Place as Prescribed: Yes Patient Has Alerts: No Has Compression in Place as Prescribed: Yes Pain Present Now: No Electronic Signature(s) Signed: 11/23/2016 4:59:50 PM By: Regan Lemming BSN, RN Entered By: Regan Lemming on 11/23/2016 10:26:42 [name] ([id number]) -------------------------------------------------------------------------------- Clinic Level of Care Assessment Details Patient Name: [name]. Date of Service: 11/23/2016 10:30 AM Medical Record Patient Account Number: [id number] Number: Treating RN: Baruch Gouty, RN, BSN, Rita 01/23/1951 [phone] y.o. Other Clinician: Date of Birth/Sex: Female) Treating ROBSON, MICHAEL Primary Care Physician/Extender: Mila Palmer, LINDA Physician: Referring Physician: MILES, LINDA Weeks in Treatment: 4 Clinic Level of Care Assessment Items TOOL 4 Quantity  Score []  - Use when only an EandM is performed on FOLLOW-UP visit 0 ASSESSMENTS - Nursing Assessment / Reassessment X - Reassessment of Co-morbidities (includes updates in patient status) 1 10 X - Reassessment of Adherence to Treatment Plan 1 5 ASSESSMENTS - Wound and Skin Assessment / Reassessment []  - Simple Wound Assessment / Reassessment - one wound 0 []  - Complex Wound Assessment / Reassessment - multiple wounds 0 []  - Dermatologic / Skin Assessment (not related to wound area) 0 ASSESSMENTS - Focused Assessment []  - Circumferential Edema Measurements - multi extremities 0 []  - Nutritional Assessment / Counseling / Intervention 0 X - Lower Extremity Assessment (monofilament, tuning fork, pulses) 1 5 []  - Peripheral Arterial Disease Assessment (using hand held doppler) 0 ASSESSMENTS - Ostomy and/or Continence Assessment and Care []  - Incontinence Assessment and Management 0 []  - Ostomy Care Assessment and Management (repouching, etc.) 0 PROCESS - Coordination of Care X - Simple Patient / Family Education for ongoing care 1 15 []  - Complex (extensive) Patient / Family Education for ongoing care 0 []  - Staff obtains Consents, Records, Test Results / Process Orders 0 AUBRYNN, BOSSIE ([id number]) []  - Staff telephones HHA, Nursing Homes / Clarify orders / etc 0 []  - Routine Transfer to another Facility (non-emergent condition) 0 []  - Routine Hospital Admission (non-emergent condition) 0 []  - New Admissions / Biomedical engineer / Ordering NPWT, Apligraf, etc. 0 []  - Emergency Hospital Admission (emergent condition) 0 []  - Simple Discharge Coordination 0 []  - Complex (extensive) Discharge Coordination 0 PROCESS - Special Needs []  - Pediatric / Minor Patient Management 0 []  - Isolation Patient Management 0 []  - Hearing / Language / Visual special needs 0 []  - Assessment of Community assistance (transportation, D/C planning, etc.) 0 []  - Additional assistance / Altered mentation  0 []  - Support Surface(s) Assessment (bed, cushion, seat, etc.) 0 INTERVENTIONS - Wound Cleansing / Measurement []  - Simple Wound Cleansing - one wound 0 []  - Complex  Wound Cleansing - multiple wounds 0 X - Wound Imaging (photographs - any number of wounds) 1 5 []  - Wound Tracing (instead of photographs) 0 []  - Simple Wound Measurement - one wound 0 []  - Complex Wound Measurement - multiple wounds 0 INTERVENTIONS - Wound Dressings []  - Small Wound Dressing one or multiple wounds 0 []  - Medium Wound Dressing one or multiple wounds 0 []  - Large Wound Dressing one or multiple wounds 0 []  - Application of Medications - topical 0 []  - Application of Medications - injection 0 Beltran, Maricsa L. ([id number]) INTERVENTIONS - Miscellaneous []  - External ear exam 0 []  - Specimen Collection (cultures, biopsies, blood, body fluids, etc.) 0 []  - Specimen(s) / Culture(s) sent or taken to Lab for analysis 0 []  - Patient Transfer (multiple staff / Civil Service fast streamer / Similar devices) 0 []  - Simple Staple / Suture removal (25 or less) 0 []  - Complex Staple / Suture removal (26 or more) 0 []  - Hypo / Hyperglycemic Management (close monitor of Blood Glucose) 0 []  - Ankle / Brachial Index (ABI) - do not check if billed separately 0 X - Vital Signs 1 5 Has the patient been seen at the hospital within the last three years: Yes Total Score: 45 Level Of Care: New/Established - Level 2 Electronic Signature(s) Signed: 11/23/2016 4:59:50 PM By: Regan Lemming BSN, RN Entered By: Regan Lemming on 11/23/2016 10:38:41 [name] ([id number]) -------------------------------------------------------------------------------- Encounter Discharge Information Details Patient Name: [name], Allina L. Date of Service: 11/23/2016 10:30 AM Medical Record Patient Account Number: [id number] Number: Treating RN: Baruch Gouty, RN, BSN, Rita [birth year] [phone] y.o. Other Clinician: Date of Birth/Sex: Female) Treating ROBSON,  MICHAEL Primary Care Physician/Extender: Mila Palmer, LINDA Physician: Referring Physician: MILES, LINDA Weeks in Treatment: 4 Encounter Discharge Information Items Discharge Pain Level: 0 Discharge Condition: Stable Ambulatory Status: Ambulatory Discharge Destination: Home Private Transportation: Auto Accompanied By: self Schedule Follow-up Appointment: No Medication Reconciliation completed and No provided to Patient/Care Tyasia Packard: Clinical Summary of Care: Electronic Signature(s) Signed: 11/23/2016 4:59:50 PM By: Regan Lemming BSN, RN Previous Signature: 11/23/2016 10:42:59 AM Version By: Ruthine Dose Entered By: Regan Lemming on 11/23/2016 10:43:05 [name] ([id number]) -------------------------------------------------------------------------------- Lower Extremity Assessment Details Patient Name: [name], Amandajo L. Date of Service: 11/23/2016 10:30 AM Medical Record Patient Account Number: [id number] Number: Treating RN: Baruch Gouty, RN, BSN, Rita 04-30-1951 [phone] y.o. Other Clinician: Date of Birth/Sex: Female) Treating ROBSON, MICHAEL Primary Care Physician/Extender: Mila Palmer, LINDA Physician: Referring Physician: MILES, LINDA Weeks in Treatment: 4 Edema Assessment Assessed: [Left: No] [Right: No] E[Left: dema] [Right: :] Calf Left: Right: Point of Measurement: 36 cm From Medial Instep 38.2 cm cm Ankle Left: Right: Point of Measurement: 10 cm From Medial Instep 27.6 cm cm Vascular Assessment Claudication: Claudication Assessment [Left:None] Pulses: Dorsalis Pedis Palpable: [Left:Yes] Posterior Tibial Extremity colors, hair growth, and conditions: Extremity Color: [Left:Normal] Hair Growth on Extremity: [Left:Yes] Temperature of Extremity: [Left:Warm] Capillary Refill: [Left:< 3 seconds] Toe Nail Assessment Left: Right: Thick: Yes Discolored: Yes Deformed: No Improper Length and Hygiene: No Electronic Signature(sLIESEL, DANZA  ([id number]) Signed: 11/23/2016 4:59:50 PM By: Regan Lemming BSN, RN Entered By: Regan Lemming on 11/23/2016 10:28:36 [name] ([id number]) -------------------------------------------------------------------------------- Multi Wound Chart Details Patient Name: [name], Onda L. Date of Service: 11/23/2016 10:30 AM Medical Record Patient Account Number: [id number] Number: Treating RN: Baruch Gouty, RN, BSN, Rita Sep 12, 1951 [phone] y.o. 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] Comorbid History: [4:Asthma, Hypertension, Peripheral Venous Disease, Type II Diabetes, Neuropathy, Seizure Disorder, Received Radiation] [N/A:N/A] Date Acquired: [4:09/14/2016] [N/A:N/A] Weeks of Treatment: [4:4] [N/A:N/A] Wound Status: [4:Healed - Epithelialized] [N/A:N/A] Measurements L x W x D 0x0x0 [N/A:N/A] (cm) Area (cm) : [4:0] [N/A:N/A] Volume (cm) : [4:0] [N/A:N/A] % Reduction in Area: [4:100.00%] [N/A:N/A] % Reduction in Volume: 100.00% [N/A:N/A] Classification: [4:Grade 1] [N/A:N/A] Exudate Amount: [4:None Present] [N/A:N/A] Wound Margin: [4:Distinct, outline attached N/A] Granulation Amount: [4:None Present (0%)] [N/A:N/A] Necrotic Amount: [4:None Present (0%)] [N/A:N/A] Exposed Structures: [4:Fascia: No Fat: No] [N/A:N/A] Tendon: No Muscle: No Joint: No Bone: No Limited to Skin Breakdown Epithelialization: Large (67-100%) N/A N/A Periwound Skin Texture: Edema: No N/A  N/A Excoriation: No Induration: No Callus: No Crepitus: No Fluctuance: No Friable: No Rash: No Scarring: No Periwound Skin Dry/Scaly: Yes N/A N/A Moisture: Maceration: No Moist: No Periwound Skin Color: Atrophie Blanche: No N/A N/A Cyanosis: No Ecchymosis: No Erythema: No Hemosiderin Staining: No Mottled: No Pallor: No Rubor: No Temperature: No Abnormality N/A N/A Tenderness on No N/A N/A Palpation: Wound Preparation: Ulcer Cleansing: N/A N/A Rinsed/Irrigated with Saline Topical Anesthetic Applied: None Treatment Notes Electronic Signature(s) Signed: 11/24/2016 7:43:54 AM By: Linton Ham MD Entered By: Linton Ham on 11/23/2016 10:42:00 [name] ([id number]) -------------------------------------------------------------------------------- [place] Details Patient Name: [name]. Date of Service: 11/23/2016 10:30 AM Medical Record Patient Account Number: [id number] Number: Treating RN: Baruch Gouty, RN, BSN, Rita 23-Dec-1950 [phone] y.o. Other Clinician: Date of Birth/Sex: Female) Treating ROBSON, MICHAEL Primary Care Physician/Extender: Mila Palmer, LINDA Physician: Referring Physician: Gerre Scull in Treatment: 4 Active Inactive Electronic Signature(s) Signed: 11/23/2016 4:59:50 PM By: Regan Lemming BSN, RN Entered By: Regan Lemming on 11/23/2016 10:37:31 [name] ([id number]) -------------------------------------------------------------------------------- Pain Assessment Details Patient Name: [name], Oanh L. Date of Service: 11/23/2016 10:30 AM Medical Record Patient Account Number: [id number] Number: Treating RN: Baruch Gouty, RN, BSN, Rita Nov 17, 1950 [phone] y.o. Other Clinician: Date of Birth/Sex: Female) Treating ROBSON, MICHAEL Primary Care Physician/Extender: Mila Palmer, LINDA Physician: Referring Physician: MILES, LINDA Weeks in Treatment: 4 Active Problems Location of Pain Severity and  Description of Pain Patient Has Paino No Site Locations With Dressing Change: No Pain Management and Medication Current Pain Management: Electronic Signature(s) Signed: 11/23/2016 4:59:50 PM By: Regan Lemming BSN, RN Entered By: Regan Lemming on 11/23/2016 10:26:49 [name] ([id number]) -------------------------------------------------------------------------------- Patient/Caregiver Education Details Patient Name: [name]. Date of Service: 11/23/2016 10:30 AM Medical Record Patient Account Number: [id number] Number: Treating RN: Baruch Gouty, RN, BSN, Rita Jun 09, 1951 [phone] y.o. Other Clinician: Date of Birth/Gender: Female) Treating ROBSON, MICHAEL Primary Care Physician/Extender: Judithann Sheen Physician: Suella [place] in Treatment: 4 Referring Physician: Delight Stare Education Assessment Education Provided To: Patient Education Topics Provided Electronic Signature(s) Signed: 11/23/2016 4:59:50 PM By: Regan Lemming BSN, RN Entered By: Regan Lemming on 11/23/2016 10:43:13 [name] ([id number]) -------------------------------------------------------------------------------- Wound Assessment Details Patient Name: [name], Iman L. Date of Service: 11/23/2016 10:30 AM Medical Record Patient Account Number: [id number] Number: Treating RN: Baruch Gouty, RN, BSN, Rita November 05, 1951 [phone] y.o. Other Clinician: Date of Birth/Sex: Female) Treating ROBSON, MICHAEL Primary Care Physician/Extender: Mila Palmer, LINDA Physician: Referring Physician: MILES, LINDA Weeks in  Treatment: 4 Wound Status Wound Number: 4 Primary Diabetic Wound/Ulcer of the Lower Etiology: Extremity Wound Location: Left Toe Great - Plantar Wound Healed - Epithelialized Wounding Event: Gradually Appeared Status: Date Acquired: 09/14/2016 Comorbid Asthma, Hypertension, Peripheral Weeks Of Treatment: 4 History: Venous Disease, Type II Diabetes, Clustered Wound: No Neuropathy, Seizure Disorder,  Received Radiation Wound Measurements Length: (cm) 0 % Reduct Width: (cm) 0 % Reduct Depth: (cm) 0 Epitheli Area: (cm) 0 Tunneli Volume: (cm) 0 Undermi ion in Area: 100% ion in Volume: 100% alization: Large (67-100%) ng: No ning: No Wound Description Classification: Grade 1 Wound Margin: Distinct, outline attached Exudate Amount: None Present Foul Odor After Cleansing: No Wound Bed Granulation Amount: None Present (0%) Exposed Structure Necrotic Amount: None Present (0%) Fascia Exposed: No Fat Layer Exposed: No Tendon Exposed: No Muscle Exposed: No Joint Exposed: No Bone Exposed: No Limited to Skin Breakdown Periwound Skin Texture Texture Color No Abnormalities Noted: No No Abnormalities Noted: No Callus: No Atrophie Blanche: No Crepitus: No Cyanosis: No Boxley, Kimberle L. ([id number]) Excoriation: No Ecchymosis: No Fluctuance: No Erythema: No Friable: No Hemosiderin Staining: No Induration: No Mottled: No Localized Edema: No Pallor: No Rash: No Rubor: No Scarring: No Temperature / Pain Moisture Temperature: No Abnormality No Abnormalities Noted: No Dry / Scaly: Yes Maceration: No Moist: No Wound Preparation Ulcer Cleansing: Rinsed/Irrigated with Saline Topical Anesthetic Applied: None Electronic Signature(s) Signed: 11/23/2016 4:59:50 PM By: Regan Lemming BSN, RN Entered By: Regan Lemming on 11/23/2016 10:35:59 [name] ([id number]) -------------------------------------------------------------------------------- Vitals Details Patient Name: [name], Trenise L. Date of Service: 11/23/2016 10:30 AM Medical Record Patient Account Number: [id number] Number: Treating RN: Baruch Gouty, RN, BSN, Rita 1950-11-17 [phone] y.o. Other Clinician: Date of Birth/Sex: Female) Treating ROBSON, MICHAEL Primary Care Physician/Extender: Mila Palmer, LINDA Physician: Referring Physician: MILES, LINDA Weeks in Treatment: 4 Vital Signs Time Taken:  10:28 Temperature (F): 98 Height (in): 63 Pulse (bpm): 61 Weight (lbs): 191 Respiratory Rate (breaths/min): 16 Body Mass Index (BMI): 33.8 Blood Pressure (mmHg): 167/74 Reference Range: 80 - 120 mg / dl Electronic Signature(s) Signed: 11/23/2016 4:59:50 PM By: Regan Lemming BSN, RN Entered By: Regan Lemming on 11/23/2016 10:29:53

## 2017-01-22 ENCOUNTER — Emergency Department: Payer: Medicare HMO

## 2017-01-22 ENCOUNTER — Inpatient Hospital Stay
Admission: EM | Admit: 2017-01-22 | Discharge: 2017-02-01 | DRG: 252 | Disposition: A | Discharge status: Skilled Nursing Facility | Payer: Medicare HMO | Attending: Specialist | Admitting: Specialist

## 2017-01-22 ENCOUNTER — Encounter: Payer: Self-pay | Admitting: Emergency Medicine

## 2017-01-22 DIAGNOSIS — Z6833 Body mass index (BMI) 33.0-33.9, adult: Secondary | ICD-10-CM | POA: Diagnosis not present

## 2017-01-22 DIAGNOSIS — L02619 Cutaneous abscess of unspecified foot: Secondary | ICD-10-CM | POA: Diagnosis present

## 2017-01-22 DIAGNOSIS — I70261 Atherosclerosis of native arteries of extremities with gangrene, right leg: Secondary | ICD-10-CM | POA: Diagnosis present

## 2017-01-22 DIAGNOSIS — Z79899 Other long term (current) drug therapy: Secondary | ICD-10-CM | POA: Diagnosis not present

## 2017-01-22 DIAGNOSIS — L97519 Non-pressure chronic ulcer of other part of right foot with unspecified severity: Secondary | ICD-10-CM | POA: Diagnosis present

## 2017-01-22 DIAGNOSIS — I890 Lymphedema, not elsewhere classified: Secondary | ICD-10-CM | POA: Diagnosis present

## 2017-01-22 DIAGNOSIS — E876 Hypokalemia: Secondary | ICD-10-CM | POA: Diagnosis present

## 2017-01-22 DIAGNOSIS — Z7982 Long term (current) use of aspirin: Secondary | ICD-10-CM | POA: Diagnosis not present

## 2017-01-22 DIAGNOSIS — Z7902 Long term (current) use of antithrombotics/antiplatelets: Secondary | ICD-10-CM | POA: Diagnosis not present

## 2017-01-22 DIAGNOSIS — Z853 Personal history of malignant neoplasm of breast: Secondary | ICD-10-CM

## 2017-01-22 DIAGNOSIS — E785 Hyperlipidemia, unspecified: Secondary | ICD-10-CM | POA: Diagnosis present

## 2017-01-22 DIAGNOSIS — A480 Gas gangrene: Secondary | ICD-10-CM | POA: Diagnosis present

## 2017-01-22 DIAGNOSIS — E1151 Type 2 diabetes mellitus with diabetic peripheral angiopathy without gangrene: Secondary | ICD-10-CM

## 2017-01-22 DIAGNOSIS — Z794 Long term (current) use of insulin: Secondary | ICD-10-CM | POA: Diagnosis not present

## 2017-01-22 DIAGNOSIS — I70238 Atherosclerosis of native arteries of right leg with ulceration of other part of lower right leg: Secondary | ICD-10-CM | POA: Diagnosis not present

## 2017-01-22 DIAGNOSIS — E119 Type 2 diabetes mellitus without complications: Secondary | ICD-10-CM

## 2017-01-22 DIAGNOSIS — L0390 Cellulitis, unspecified: Secondary | ICD-10-CM

## 2017-01-22 DIAGNOSIS — I10 Essential (primary) hypertension: Secondary | ICD-10-CM | POA: Diagnosis present

## 2017-01-22 DIAGNOSIS — R6510 Systemic inflammatory response syndrome (SIRS) of non-infectious origin without acute organ dysfunction: Secondary | ICD-10-CM

## 2017-01-22 DIAGNOSIS — R600 Localized edema: Secondary | ICD-10-CM | POA: Diagnosis not present

## 2017-01-22 DIAGNOSIS — E1169 Type 2 diabetes mellitus with other specified complication: Secondary | ICD-10-CM | POA: Diagnosis present

## 2017-01-22 DIAGNOSIS — Z8673 Personal history of transient ischemic attack (TIA), and cerebral infarction without residual deficits: Secondary | ICD-10-CM

## 2017-01-22 DIAGNOSIS — E11628 Type 2 diabetes mellitus with other skin complications: Secondary | ICD-10-CM | POA: Diagnosis present

## 2017-01-22 DIAGNOSIS — M7989 Other specified soft tissue disorders: Secondary | ICD-10-CM | POA: Diagnosis present

## 2017-01-22 DIAGNOSIS — M79604 Pain in right leg: Secondary | ICD-10-CM | POA: Diagnosis not present

## 2017-01-22 DIAGNOSIS — B999 Unspecified infectious disease: Secondary | ICD-10-CM

## 2017-01-22 DIAGNOSIS — R509 Fever, unspecified: Secondary | ICD-10-CM | POA: Diagnosis not present

## 2017-01-22 DIAGNOSIS — E11621 Type 2 diabetes mellitus with foot ulcer: Secondary | ICD-10-CM | POA: Diagnosis present

## 2017-01-22 DIAGNOSIS — E1152 Type 2 diabetes mellitus with diabetic peripheral angiopathy with gangrene: Principal | ICD-10-CM | POA: Diagnosis present

## 2017-01-22 DIAGNOSIS — J45909 Unspecified asthma, uncomplicated: Secondary | ICD-10-CM | POA: Diagnosis present

## 2017-01-22 DIAGNOSIS — M869 Osteomyelitis, unspecified: Secondary | ICD-10-CM | POA: Diagnosis present

## 2017-01-22 DIAGNOSIS — L03115 Cellulitis of right lower limb: Secondary | ICD-10-CM

## 2017-01-22 DIAGNOSIS — K219 Gastro-esophageal reflux disease without esophagitis: Secondary | ICD-10-CM | POA: Diagnosis present

## 2017-01-22 DIAGNOSIS — E1142 Type 2 diabetes mellitus with diabetic polyneuropathy: Secondary | ICD-10-CM

## 2017-01-22 LAB — CBC WITH DIFFERENTIAL/PLATELET
Basophils Absolute: 0.1 10*3/uL (ref 0–0.1)
Basophils Relative: 0 %
Eosinophils Absolute: 0 10*3/uL (ref 0–0.7)
Eosinophils Relative: 0 %
HEMATOCRIT: 31.5 % — AB (ref 35.0–47.0)
Hemoglobin: 10.7 g/dL — ABNORMAL LOW (ref 12.0–16.0)
Lymphocytes Relative: 4 %
Lymphs Abs: 0.6 10*3/uL — ABNORMAL LOW (ref 1.0–3.6)
MCH: 28.5 pg (ref 26.0–34.0)
MCHC: 34 g/dL (ref 32.0–36.0)
MCV: 83.8 fL (ref 80.0–100.0)
MONO ABS: 1.1 10*3/uL — AB (ref 0.2–0.9)
MONOS PCT: 7 %
NEUTROS PCT: 89 %
Neutro Abs: 14.3 10*3/uL — ABNORMAL HIGH (ref 1.4–6.5)
Platelets: 261 10*3/uL (ref 150–440)
RBC: 3.76 MIL/uL — ABNORMAL LOW (ref 3.80–5.20)
RDW: 12.6 % (ref 11.5–14.5)
WBC: 16.1 10*3/uL — ABNORMAL HIGH (ref 3.6–11.0)

## 2017-01-22 LAB — BLOOD GAS, VENOUS
Patient temperature: 37
pCO2, Ven: 44 mmHg (ref 44.0–60.0)
pH, Ven: 7.44 — ABNORMAL HIGH (ref 7.250–7.430)
pO2, Ven: <31 mmHg — CL (ref 32.0–45.0)

## 2017-01-22 LAB — COMPREHENSIVE METABOLIC PANEL
ALBUMIN: 3.1 g/dL — AB (ref 3.5–5.0)
ALT: 24 U/L (ref 14–54)
AST: 36 U/L (ref 15–41)
Alkaline Phosphatase: 79 U/L (ref 38–126)
Anion gap: 8 (ref 5–15)
BUN: 22 mg/dL — ABNORMAL HIGH (ref 6–20)
CALCIUM: 8.5 mg/dL — AB (ref 8.9–10.3)
CHLORIDE: 95 mmol/L — AB (ref 101–111)
CO2: 29 mmol/L (ref 22–32)
CREATININE: 1.02 mg/dL — AB (ref 0.44–1.00)
GFR calc Af Amer: >60 mL/min (ref >60)
GFR calc non Af Amer: 56 mL/min — ABNORMAL LOW (ref >60)
Glucose, Bld: 259 mg/dL — ABNORMAL HIGH (ref 65–99)
Potassium: 2.9 mmol/L — ABNORMAL LOW (ref 3.5–5.1)
Sodium: 132 mmol/L — ABNORMAL LOW (ref 135–145)
Total Bilirubin: 0.6 mg/dL (ref 0.3–1.2)
Total Protein: 7.3 g/dL (ref 6.5–8.1)

## 2017-01-22 LAB — GLUCOSE, CAPILLARY
GLUCOSE-CAPILLARY: 219 mg/dL — AB (ref 65–99)
Glucose-Capillary: 172 mg/dL — ABNORMAL HIGH (ref 65–99)

## 2017-01-22 LAB — LACTIC ACID, PLASMA: LACTIC ACID, VENOUS: 1.8 mmol/L (ref 0.5–1.9)

## 2017-01-22 LAB — MRSA PCR SCREENING: MRSA by PCR: POSITIVE — AB

## 2017-01-22 LAB — TROPONIN I: TROPONIN I: 0.03 ng/mL — AB (ref <0.03)

## 2017-01-22 IMAGING — DX DG CHEST 1V PORT
1 series · 1 of 1 positions shown · non-contrast
Comparison: [DATE]

CLINICAL DATA: Cellulitis of right lower leg.  Fever.

EXAM:
PORTABLE CHEST 1 VIEW

[chest ap]
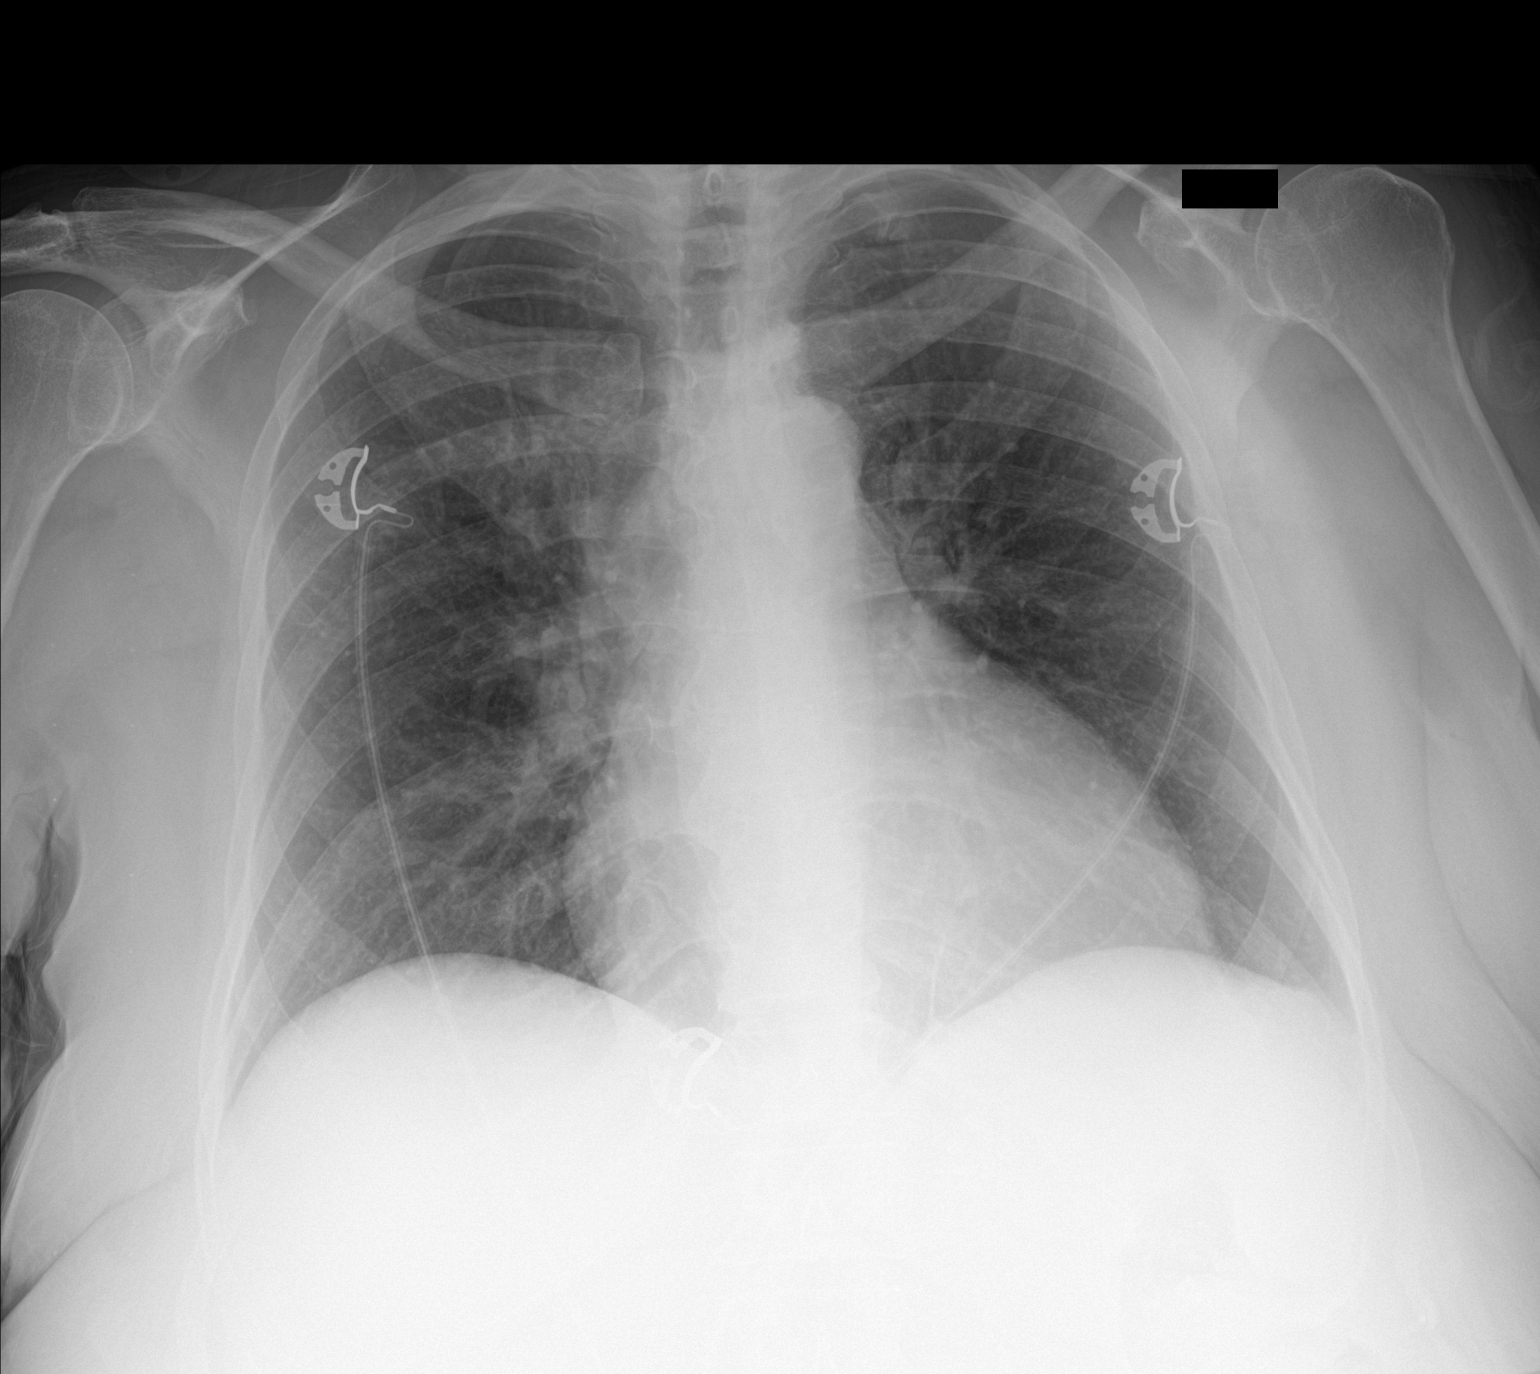

[1 of 1 positions shown; findings below may reference images not displayed]

FINDINGS: Stable cardiomegaly. The hila and mediastinum are normal. No
pneumothorax. No pulmonary nodules or masses. No focal infiltrates
or overt edema.
IMPRESSION: No active disease.

## 2017-01-22 MED ORDER — LISINOPRIL 10 MG PO TABS
10.0000 mg | ORAL_TABLET | Freq: Every day | ORAL | Status: DC
  Administered 2017-01-26 – 2017-02-01 (×5): 10 mg via ORAL
  Filled 2017-01-22 (×6): qty 1

## 2017-01-22 MED ORDER — SODIUM CHLORIDE 0.9 % IV BOLUS (SEPSIS)
1000.0000 mL | Freq: Once | INTRAVENOUS | Status: AC
  Administered 2017-01-22: 1000 mL via INTRAVENOUS

## 2017-01-22 MED ORDER — VANCOMYCIN HCL IN DEXTROSE 1-5 GM/200ML-% IV SOLN
1000.0000 mg | INTRAVENOUS | Status: DC
  Administered 2017-01-22 – 2017-01-25 (×5): 1000 mg via INTRAVENOUS
  Filled 2017-01-22 (×6): qty 200

## 2017-01-22 MED ORDER — VANCOMYCIN HCL IN DEXTROSE 1-5 GM/200ML-% IV SOLN
1000.0000 mg | Freq: Once | INTRAVENOUS | Status: AC
  Administered 2017-01-22: 1000 mg via INTRAVENOUS
  Filled 2017-01-22: qty 200

## 2017-01-22 MED ORDER — FUROSEMIDE 20 MG PO TABS
20.0000 mg | ORAL_TABLET | Freq: Every day | ORAL | Status: DC
  Administered 2017-01-22 – 2017-02-01 (×8): 20 mg via ORAL
  Filled 2017-01-22 (×7): qty 1

## 2017-01-22 MED ORDER — POTASSIUM CHLORIDE CRYS ER 20 MEQ PO TBCR
40.0000 meq | EXTENDED_RELEASE_TABLET | Freq: Once | ORAL | Status: AC
  Administered 2017-01-22: 40 meq via ORAL
  Filled 2017-01-22: qty 2

## 2017-01-22 MED ORDER — CLOPIDOGREL BISULFATE 75 MG PO TABS
75.0000 mg | ORAL_TABLET | Freq: Every day | ORAL | Status: DC
  Administered 2017-01-23 – 2017-02-01 (×7): 75 mg via ORAL
  Filled 2017-01-22 (×7): qty 1

## 2017-01-22 MED ORDER — TIMOLOL MALEATE 0.5 % OP SOLN
1.0000 [drp] | Freq: Two times a day (BID) | OPHTHALMIC | Status: DC
  Administered 2017-01-22 – 2017-01-26 (×2): 1 [drp] via OPHTHALMIC
  Filled 2017-01-22: qty 5

## 2017-01-22 MED ORDER — ALBUTEROL SULFATE (2.5 MG/3ML) 0.083% IN NEBU
3.0000 mL | INHALATION_SOLUTION | Freq: Four times a day (QID) | RESPIRATORY_TRACT | Status: DC | PRN
  Administered 2017-01-25: 3 mL via RESPIRATORY_TRACT
  Filled 2017-01-22: qty 3

## 2017-01-22 MED ORDER — GABAPENTIN 300 MG PO CAPS
300.0000 mg | ORAL_CAPSULE | Freq: Three times a day (TID) | ORAL | Status: DC
  Administered 2017-01-22 – 2017-02-01 (×27): 300 mg via ORAL
  Filled 2017-01-22 (×27): qty 1

## 2017-01-22 MED ORDER — METFORMIN HCL 500 MG PO TABS
1000.0000 mg | ORAL_TABLET | Freq: Two times a day (BID) | ORAL | Status: DC
  Administered 2017-01-22 – 2017-02-01 (×18): 1000 mg via ORAL
  Filled 2017-01-22 (×19): qty 2

## 2017-01-22 MED ORDER — PIPERACILLIN-TAZOBACTAM 3.375 G IVPB 30 MIN
3.3750 g | Freq: Once | INTRAVENOUS | Status: AC
  Administered 2017-01-22: 3.375 g via INTRAVENOUS
  Filled 2017-01-22: qty 50

## 2017-01-22 MED ORDER — CALCIUM CARBONATE-VITAMIN D 500-200 MG-UNIT PO TABS
2.0000 | ORAL_TABLET | Freq: Every day | ORAL | Status: DC
  Administered 2017-01-23 – 2017-02-01 (×8): 2 via ORAL
  Filled 2017-01-22 (×9): qty 2

## 2017-01-22 MED ORDER — DOCUSATE SODIUM 100 MG PO CAPS
100.0000 mg | ORAL_CAPSULE | Freq: Two times a day (BID) | ORAL | Status: DC
  Administered 2017-01-22 – 2017-01-31 (×15): 100 mg via ORAL
  Filled 2017-01-22 (×17): qty 1

## 2017-01-22 MED ORDER — INSULIN ASPART 100 UNIT/ML ~~LOC~~ SOLN
0.0000 [IU] | Freq: Three times a day (TID) | SUBCUTANEOUS | Status: DC
  Administered 2017-01-22: 2 [IU] via SUBCUTANEOUS
  Administered 2017-01-23: 1 [IU] via SUBCUTANEOUS
  Administered 2017-01-24: 2 [IU] via SUBCUTANEOUS
  Administered 2017-01-25: 1 [IU] via SUBCUTANEOUS
  Filled 2017-01-22: qty 2
  Filled 2017-01-22: qty 1
  Filled 2017-01-22: qty 2
  Filled 2017-01-22 (×2): qty 1

## 2017-01-22 MED ORDER — ASPIRIN 81 MG PO CHEW
81.0000 mg | CHEWABLE_TABLET | Freq: Every day | ORAL | Status: DC
  Administered 2017-01-22 – 2017-01-27 (×4): 81 mg via ORAL
  Filled 2017-01-22 (×7): qty 1

## 2017-01-22 MED ORDER — ONDANSETRON HCL 4 MG PO TABS: 4.0000 mg | ORAL_TABLET | Freq: Four times a day (QID) | ORAL | Status: DC | PRN

## 2017-01-22 MED ORDER — ATENOLOL 50 MG PO TABS
50.0000 mg | ORAL_TABLET | Freq: Every day | ORAL | Status: DC
  Administered 2017-01-22 – 2017-02-01 (×7): 50 mg via ORAL
  Filled 2017-01-22 (×9): qty 1

## 2017-01-22 MED ORDER — BISACODYL 10 MG RE SUPP: 10.0000 mg | Freq: Every day | RECTAL | Status: DC | PRN

## 2017-01-22 MED ORDER — HEPARIN SODIUM (PORCINE) 5000 UNIT/ML IJ SOLN
5000.0000 [IU] | Freq: Three times a day (TID) | INTRAMUSCULAR | Status: DC
  Administered 2017-01-22 – 2017-02-01 (×24): 5000 [IU] via SUBCUTANEOUS
  Filled 2017-01-22 (×24): qty 1

## 2017-01-22 MED ORDER — LINAGLIPTIN 5 MG PO TABS
5.0000 mg | ORAL_TABLET | Freq: Every day | ORAL | Status: DC
  Administered 2017-01-23 – 2017-02-01 (×7): 5 mg via ORAL
  Filled 2017-01-22 (×7): qty 1

## 2017-01-22 MED ORDER — ACETAMINOPHEN 325 MG PO TABS
650.0000 mg | ORAL_TABLET | Freq: Four times a day (QID) | ORAL | Status: DC | PRN
  Administered 2017-01-22 – 2017-01-27 (×8): 650 mg via ORAL
  Filled 2017-01-22 (×9): qty 2

## 2017-01-22 MED ORDER — BRIMONIDINE TARTRATE 0.2 % OP SOLN
1.0000 [drp] | Freq: Two times a day (BID) | OPHTHALMIC | Status: DC
  Administered 2017-01-22 – 2017-01-26 (×2): 1 [drp] via OPHTHALMIC
  Filled 2017-01-22: qty 5

## 2017-01-22 MED ORDER — ONDANSETRON HCL 4 MG/2ML IJ SOLN: 4.0000 mg | Freq: Four times a day (QID) | INTRAMUSCULAR | Status: DC | PRN

## 2017-01-22 MED ORDER — PIPERACILLIN-TAZOBACTAM 3.375 G IVPB
3.3750 g | Freq: Three times a day (TID) | INTRAVENOUS | Status: DC
  Administered 2017-01-22 – 2017-01-24 (×5): 3.375 g via INTRAVENOUS
  Filled 2017-01-22 (×6): qty 50

## 2017-01-22 MED ORDER — ACETAMINOPHEN 650 MG RE SUPP: 650.0000 mg | Freq: Four times a day (QID) | RECTAL | Status: DC | PRN

## 2017-01-22 MED ORDER — POTASSIUM CHLORIDE IN NACL 40-0.9 MEQ/L-% IV SOLN
INTRAVENOUS | Status: DC
  Administered 2017-01-22 – 2017-01-24 (×4): 75 mL/h via INTRAVENOUS
  Filled 2017-01-22 (×9): qty 1000

## 2017-01-22 MED ORDER — FAMOTIDINE 20 MG PO TABS
20.0000 mg | ORAL_TABLET | Freq: Two times a day (BID) | ORAL | Status: DC
  Administered 2017-01-22 – 2017-02-01 (×15): 20 mg via ORAL
  Filled 2017-01-22 (×16): qty 1

## 2017-01-22 MED ORDER — LETROZOLE 2.5 MG PO TABS
2.5000 mg | ORAL_TABLET | Freq: Every day | ORAL | Status: DC
  Administered 2017-01-23 – 2017-02-01 (×7): 2.5 mg via ORAL
  Filled 2017-01-22 (×8): qty 1

## 2017-01-22 MED ORDER — PRAVASTATIN SODIUM 40 MG PO TABS
40.0000 mg | ORAL_TABLET | Freq: Every day | ORAL | Status: DC
  Administered 2017-01-22 – 2017-01-31 (×10): 40 mg via ORAL
  Filled 2017-01-22 (×10): qty 1

## 2017-01-22 MED ORDER — MORPHINE SULFATE (PF) 2 MG/ML IV SOLN: 2.0000 mg | INTRAVENOUS | Status: DC | PRN

## 2017-01-22 NOTE — ED Provider Notes (Signed)
[facility] Emergency Department Provider Note        Time seen: ----------------------------------------- 11:20 AM on 01/22/2017 -----------------------------------------    I have reviewed the triage vital signs and the nursing notes.   HISTORY  Chief Complaint Cellulitis    HPI Elizabeth [name] is a 66 y.o. female who presents to ER being brought by private vehicle for cellulitis in her right leg. Patient states her primary care doctor prescribed doxycycline but states symptoms have continued to worsen. Patient states she fell in the shower 3/13 and complains of pain in both legs. She has feverbut denies any shaking chills. She denies chest pain, shortness of breath or vomiting. Patient states she's been taking the doxycycline but her symptoms have worsened. She does report history of cellulitis for which she was hospitalized 2 years ago.   Past Medical History:  Diagnosis Date  . Cancer [facility])    breast  . Carcinoma of right breast ([place]) 05/20/2014  . Diabetes mellitus without complication ([place])   . Hypertension   . Lymphedema   . PONV (postoperative nausea and vomiting)   . Psoriasis 1990  . Stroke ([place]) 2000   residual left arm weakness  . Varicose veins     Patient Active Problem List   Diagnosis Date Noted  . Morbid obesity ([place]) 09/23/2015  . Diabetes mellitus, type 2 ([place]) 05/21/2015  . Peripheral blood vessel disorder ([place]) 05/21/2015  . 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 ([place]) 05/21/2015  . Chronic venous insufficiency 02/05/2015  . Mixed incontinence 10/24/2014  . Extreme obesity ([place]) 10/24/2014  . Hernia, rectovaginal 10/24/2014  . Airway hyperreactivity 02/19/2013  . Cerebral vascular accident ([place]) 02/19/2013  . Clinical depression 02/19/2013  . Diabetes mellitus type 2 in obese ([place]) 02/19/2013  . Colon, diverticulosis 02/19/2013  . Acid reflux 02/19/2013  . HLD (hyperlipidemia) 02/19/2013  . BP (high  blood pressure) 02/19/2013    Past Surgical History:  Procedure Laterality Date  . BREAST EXCISIONAL BIOPSY Right 2015   positive, with radiation  . BREAST LUMPECTOMY Right 04-2014   followed by radiation,  no chemo  . BREAST REDUCTION SURGERY  1977  . CHOLECYSTECTOMY N/A 09/23/2015   Procedure: LAPAROSCOPIC CHOLECYSTECTOMY;  Surgeon: Bonner Puna, MD;  Location: [facility] ORS;  Service: General;  Laterality: N/A;  . DILATION AND CURETTAGE OF UTERUS  2010  . EYE SURGERY Left 2015   cataracts and left eye detached retina repair  . EYE SURGERY Right 2013   cataract with len implant  . GASTRIC ROUX-EN-Y N/A 09/23/2015   Procedure: LAPAROSCOPIC ROUX-EN-Y GASTRIC;  Surgeon: Bonner Puna, MD;  Location: [facility] ORS;  Service: General;  Laterality: N/A;  . HIATAL HERNIA REPAIR N/A 09/23/2015   Procedure: LAPAROSCOPIC REPAIR OF HIATAL HERNIA;  Surgeon: Bonner Puna, MD;  Location: [facility] ORS;  Service: General;  Laterality: N/A;  . REDUCTION MAMMAPLASTY      Allergies Patient has no known allergies.  Social History Social History  Substance Use Topics  . Smoking status: Never Smoker  . Smokeless tobacco: Never Used  . Alcohol use No    Review of Systems Constitutional: Negative for fever. Cardiovascular: Negative for chest pain. Respiratory: Negative for shortness of breath. Gastrointestinal: Negative for abdominal pain, vomiting and diarrhea. Genitourinary: Negative for dysuria. Musculoskeletal: Positive for leg pain, swelling Skin: Positive for right leg erythema Neurological: Negative for headaches, focal weakness or numbness.  10-point ROS otherwise negative.  ____________________________________________   PHYSICAL EXAM:  VITAL  SIGNS: ED Triage Vitals  Enc Vitals Group     BP 01/22/17 1026 (!) 144/86     Pulse Rate 01/22/17 1026 85     Resp 01/22/17 1026 16     Temp 01/22/17 1026 (!) 101.9 F (38.8 C)     Temp Source 01/22/17 1026 Oral     SpO2 01/22/17 1026 98 %      Weight 01/22/17 1026 190 lb (86.2 kg)     Height 01/22/17 1026 5\' 3"  (1.6 m)     Head Circumference --      Peak Flow --      Pain Score 01/22/17 1027 6     Pain Loc --      Pain Edu? --      Excl. in [place]? --     Constitutional: Alert and oriented. Well appearing and in no distress. Eyes: Conjunctivae are normal. PERRL. Normal extraocular movements. ENT   Head: Normocephalic and atraumatic.   Nose: No congestion/rhinnorhea.   Mouth/Throat: Mucous membranes are moist.   Neck: No stridor. Cardiovascular: Normal rate, regular rhythm. No murmurs, rubs, or gallops. Respiratory: Normal respiratory effort without tachypnea nor retractions. Breath sounds are clear and equal bilaterally. No wheezes/rales/rhonchi. Gastrointestinal: Soft and nontender. Normal bowel sounds Musculoskeletal: Nontender with normal range of motion in all extremities. Bilateral pitting edema, much larger in the right leg. Extensive cellulitis in the right foot and ankle. Neurologic:  Normal speech and language. No gross focal neurologic deficits are appreciated.  Skin:  Skin is warm, dry and intact., Right foot and ankle warmth with erythema Psychiatric: Mood and affect are normal. Speech and behavior are normal.  ____________________________________________  EKG: Interpreted by me. Sinus rhythm rate of 97 bpm, normal PR interval, normal QRS size, normal QT, baseline artifact  ____________________________________________  ED COURSE:  Pertinent labs & imaging results that were available during my care of the patient were reviewed by me and considered in my medical decision making (see chart for details). Patient presents to the ER with cellulitis that has failed outpatient management.   Procedures ____________________________________________   LABS (pertinent positives/negatives)  Labs Reviewed  COMPREHENSIVE METABOLIC PANEL - Abnormal; Notable for the following:       Result Value   Sodium 132  (*)    Potassium 2.9 (*)    Chloride 95 (*)    Glucose, Bld 259 (*)    BUN 22 (*)    Creatinine, Ser 1.02 (*)    Calcium 8.5 (*)    Albumin 3.1 (*)    GFR calc non Af Amer 56 (*)    All other components within normal limits  TROPONIN I - Abnormal; Notable for the following:    Troponin I 0.03 (*)    All other components within normal limits  CBC WITH DIFFERENTIAL/PLATELET - Abnormal; Notable for the following:    WBC 16.1 (*)    RBC 3.76 (*)    Hemoglobin 10.7 (*)    HCT 31.5 (*)    Neutro Abs 14.3 (*)    Lymphs Abs 0.6 (*)    Monocytes Absolute 1.1 (*)    All other components within normal limits  BLOOD GAS, VENOUS - Abnormal; Notable for the following:    pH, Ven 7.44 (*)    pO2, Ven <31.0 (*)    All other components within normal limits  CULTURE, BLOOD (ROUTINE X 2)  CULTURE, BLOOD (ROUTINE X 2)  URINE CULTURE  LACTIC ACID, PLASMA  LACTIC ACID, PLASMA  URINALYSIS, COMPLETE (  UACMP) WITH MICROSCOPIC   CRITICAL CARE Performed by: Earleen Newport   Total critical care time: 30 minutes  Critical care time was exclusive of separately billable procedures and treating other patients.  Critical care was necessary to treat or prevent imminent or life-threatening deterioration.  Critical care was time spent personally by me on the following activities: development of treatment plan with patient and/or surrogate as well as nursing, discussions with consultants, evaluation of patient's response to treatment, examination of patient, obtaining history from patient or surrogate, ordering and performing treatments and interventions, ordering and review of laboratory studies, ordering and review of radiographic studies, pulse oximetry and re-evaluation of patient's condition.   RADIOLOGY Images were viewed by me  Chest x-ray  IMPRESSION: No active disease. ____________________________________________  FINAL ASSESSMENT AND PLAN  Cellulitis  Plan: Patient with labs and  imaging as dictated above. Patient presents to the ER for cellulitis that has failed outpatient treatment. She does present with SIRS criteria but does not appear septic. She has received broad-spectrum antibiotics. We have obtained blood cultures. She is stable for admission at this time.   Earleen Newport, MD   Note: This note was generated in part or whole with voice recognition software. Voice recognition is usually quite accurate but there are transcription errors that can and very often do occur. I apologize for any typographical errors that were not detected and corrected.     Earleen Newport, MD 01/22/17 1254

## 2017-01-22 NOTE — ED Notes (Signed)
Patient transported to Ultrasound 

## 2017-01-22 NOTE — H&P (Signed)
History and Physical    Elizabeth [name] CNO:[id number] DOB: 04-22-1951 DOA: 01/22/2017  Referring physician: Dr. Jimmye Norman PCP: Marguerita Merles, MD  Specialists: none  Chief Complaint: RLE pain with fever  HPI: Elizabeth [name] is a 66 y.o. female has a past medical history significant for breast cancer, HTN, DM ,and lymphedema now with progressive pain, swelling, and redness of RLE with fever despite po Doxycycline. In ER, WBC elevated and pt was febrile. She is now admitted  Review of Systems: The patient denies anorexia, weight loss,, vision loss, decreased hearing, hoarseness, chest pain, syncope, dyspnea on exertion, peripheral edema, balance deficits, hemoptysis, abdominal pain, melena, hematochezia, severe indigestion/heartburn, hematuria, incontinence, genital sores, muscle weakness, suspicious skin lesions, transient blindness, difficulty walking, depression, unusual weight change, abnormal bleeding, enlarged lymph nodes, angioedema, and breast masses.   Past Medical History:  Diagnosis Date  . Cancer [facility])    breast  . Carcinoma of right breast ([place]) 05/20/2014  . Diabetes mellitus without complication ([place])   . Hypertension   . Lymphedema   . PONV (postoperative nausea and vomiting)   . Psoriasis 1990  . Stroke ([place]) 2000   residual left arm weakness  . Varicose veins    Past Surgical History:  Procedure Laterality Date  . BREAST EXCISIONAL BIOPSY Right 2015   positive, with radiation  . BREAST LUMPECTOMY Right 04-2014   followed by radiation,  no chemo  . BREAST REDUCTION SURGERY  1977  . CHOLECYSTECTOMY N/A 09/23/2015   Procedure: LAPAROSCOPIC CHOLECYSTECTOMY;  Surgeon: Bonner Puna, MD;  Location: [facility] ORS;  Service: General;  Laterality: N/A;  . DILATION AND CURETTAGE OF UTERUS  2010  . EYE SURGERY Left 2015   cataracts and left eye detached retina repair  . EYE SURGERY Right 2013   cataract with len implant  . GASTRIC ROUX-EN-Y N/A 09/23/2015   Procedure:  LAPAROSCOPIC ROUX-EN-Y GASTRIC;  Surgeon: Bonner Puna, MD;  Location: [facility] ORS;  Service: General;  Laterality: N/A;  . HIATAL HERNIA REPAIR N/A 09/23/2015   Procedure: LAPAROSCOPIC REPAIR OF HIATAL HERNIA;  Surgeon: Bonner Puna, MD;  Location: [facility] ORS;  Service: General;  Laterality: N/A;  . REDUCTION MAMMAPLASTY     Social History:  reports that she has never smoked. She has never used smokeless tobacco. She reports that she does not drink alcohol or use drugs.  No Known Allergies  Family History  Problem Relation Age of Onset  . Diabetes Mother   . Cancer Mother   . Breast cancer Mother 11  . Diabetes Father   . Diabetes Sister   . Diabetes Brother     Prior to Admission medications   Medication Sig Start Date End Date Taking? Authorizing Provider  albuterol (PROVENTIL HFA;VENTOLIN HFA) 108 (90 BASE) MCG/ACT inhaler Inhale 2 puffs into the lungs every 6 (six) hours as needed for wheezing or shortness of breath.   Yes Historical Provider, MD  calcium-vitamin D (OSCAL WITH D) 500-200 MG-UNIT tablet Take 2 tablets by mouth daily with breakfast. 11/23/16  Yes Lloyd Huger, MD  clopidogrel (PLAVIX) 75 MG tablet Take 75 mg by mouth. 11/06/15  Yes Historical Provider, MD  furosemide (LASIX) 20 MG tablet Take 20 mg by mouth daily.   Yes Historical Provider, MD  gabapentin (NEURONTIN) 300 MG capsule Take 300 mg by mouth 3 (three) times daily.   Yes Historical Provider, MD  letrozole (FEMARA) 2.5 MG tablet Take 1 tablet (2.5 mg total) by mouth daily. 11/23/16  Yes Lloyd Huger, MD  linagliptin (TRADJENTA) 5 MG TABS tablet Take 5 mg by mouth daily.   Yes Historical Provider, MD  lisinopril (PRINIVIL,ZESTRIL) 10 MG tablet Take 10 mg by mouth daily.   Yes Historical Provider, MD  metFORMIN (GLUCOPHAGE) 1000 MG tablet Take 1,000 mg by mouth 2 (two) times daily with a meal.   Yes Historical Provider, MD  pravastatin (PRAVACHOL) 40 MG tablet Take 40 mg by mouth daily. In afternoon   Yes  Historical Provider, MD  aspirin 81 MG tablet Take 81 mg by mouth daily. Reported on 05/25/2016    Historical Provider, MD  atenolol (TENORMIN) 50 MG tablet Take 50 mg by mouth daily. Reported on 05/25/2016    Historical Provider, MD  brimonidine (ALPHAGAN) 0.2 % ophthalmic solution Place 1 drop into the left eye 2 (two) times daily. Reported on 05/25/2016    Historical Provider, MD  insulin lispro (HUMALOG) 100 UNIT/ML injection Inject into the skin 3 (three) times daily before meals. Reported on 05/25/2016    Historical Provider, MD  ranitidine (ZANTAC) 150 MG tablet Take 150 mg by mouth 2 (two) times daily. Reported on 05/25/2016    Historical Provider, MD  timolol (BETIMOL) 0.5 % ophthalmic solution Place 1 drop into the left eye 2 (two) times daily. Reported on 05/25/2016    Historical Provider, MD   Physical Exam: Vitals:   01/22/17 1026 01/22/17 1200 01/22/17 1230  BP: (!) 144/86 108/72 118/77  Pulse: 85 88 92  Resp: 16 18 (!) 28  Temp: (!) 101.9 F (38.8 C)    TempSrc: Oral    SpO2: 98% 99% 97%  Weight: 86.2 kg (190 lb)    Height: 5\' 3"  (1.6 m)       General:  No apparent distress, WDWN, [place]/AT  Eyes: PERRL, EOMI, no scleral icterus, conjunctiva clear  ENT: moist oropharynx without exudate, TM's benign, dentition fair  Neck: supple, no lymphadenopathy. No bruits or thyromegaly  Cardiovascular: regular rate without MRG; 2+ peripheral pulses, no JVD, 1+ peripheral edema  Respiratory: CTA biL, good air movement without wheezing, rhonchi or crackled. Respiratory effort normal  Abdomen: soft, non tender to palpation, positive bowel sounds, no guarding, no rebound  Skin: Right foot and LE to mid shin red, warm, and tender. LLE normal  Musculoskeletal: normal bulk and tone, no joint swelling  Psychiatric: normal mood and affect, A&OX3  Neurologic: CN 2-12 grossly intact, Motor strength 5/5 in all 4 groups with symmetric DTR's and non-focal sensory exam  Labs on Admission:   Basic Metabolic Panel:  Recent Labs Lab 01/22/17 1146  NA 132*  K 2.9*  CL 95*  CO2 29  GLUCOSE 259*  BUN 22*  CREATININE 1.02*  CALCIUM 8.5*   Liver Function Tests:  Recent Labs Lab 01/22/17 1146  AST 36  ALT 24  ALKPHOS 79  BILITOT 0.6  PROT 7.3  ALBUMIN 3.1*   No results for input(s): LIPASE, AMYLASE in the last 168 hours. No results for input(s): AMMONIA in the last 168 hours. CBC:  Recent Labs Lab 01/22/17 1146  WBC 16.1*  NEUTROABS 14.3*  HGB 10.7*  HCT 31.5*  MCV 83.8  PLT 261   Cardiac Enzymes:  Recent Labs Lab 01/22/17 1146  TROPONINI 0.03*    BNP (last 3 results) No results for input(s): BNP in the last 8760 hours.  ProBNP (last 3 results) No results for input(s): PROBNP in the last 8760 hours.  CBG: No results for input(s): GLUCAP in the  last 168 hours.  Radiological Exams on Admission: US Venous Img Lower Bilateral  Result Date: 01/22/2017 CLINICAL DATA:  66 year old female with bilateral lower extremity edema EXAM: BILATERAL LOWER EXTREMITY VENOUS DOPPLER ULTRASOUND TECHNIQUE: Gray-scale sonography with graded compression, as well as color Doppler and duplex ultrasound were performed to evaluate the lower extremity deep venous systems from the level of the common femoral vein and including the common femoral, femoral, profunda femoral, popliteal and calf veins including the posterior tibial, peroneal and gastrocnemius veins when visible. The superficial great saphenous vein was also interrogated. Spectral Doppler was utilized to evaluate flow at rest and with distal augmentation maneuvers in the common femoral, femoral and popliteal veins. COMPARISON:  Prior duplex venous ultrasound 01/07/2015 FINDINGS: RIGHT LOWER EXTREMITY Common Femoral Vein: No evidence of thrombus. Normal compressibility, respiratory phasicity and response to augmentation. Saphenofemoral Junction: No evidence of thrombus. Normal compressibility and flow on color Doppler  imaging. Profunda Femoral Vein: No evidence of thrombus. Normal compressibility and flow on color Doppler imaging. Femoral Vein: No evidence of thrombus. Normal compressibility, respiratory phasicity and response to augmentation. Popliteal Vein: No evidence of thrombus. Normal compressibility, respiratory phasicity and response to augmentation. Calf Veins: Limited visualization of the peroneal veins. No evidence of thrombus in the posterior tibial veins. Superficial Great Saphenous Vein: No evidence of thrombus. Normal compressibility and flow on color Doppler imaging. Venous Reflux:  None. Other Findings:  None. LEFT LOWER EXTREMITY Common Femoral Vein: No evidence of thrombus. Normal compressibility, respiratory phasicity and response to augmentation. Saphenofemoral Junction: No evidence of thrombus. Normal compressibility and flow on color Doppler imaging. Profunda Femoral Vein: No evidence of thrombus. Normal compressibility and flow on color Doppler imaging. Femoral Vein: No evidence of thrombus. Normal compressibility, respiratory phasicity and response to augmentation. Popliteal Vein: No evidence of thrombus. Normal compressibility, respiratory phasicity and response to augmentation. Calf Veins: Limited visualization of the peroneal veins. No evidence of thrombus in the posterior tibial veins. Superficial Great Saphenous Vein: No evidence of thrombus. Normal compressibility and flow on color Doppler imaging. Venous Reflux:  None. Other Findings: Larger reactive lymph nodes in the superficial right inguinal nodal station. IMPRESSION: 1. No evidence of deep venous thrombosis. 2. Reactive lymph nodes in the right superficial inguinal station. Electronically Signed   By: Jacqulynn Cadet M.D.   On: 01/22/2017 13:45   Dg Chest Port 1 View  Result Date: 01/22/2017 CLINICAL DATA:  Cellulitis of right lower leg.  Fever. EXAM: PORTABLE CHEST 1 VIEW COMPARISON:  April 15, 2015 FINDINGS: Stable cardiomegaly. The hila  and mediastinum are normal. No pneumothorax. No pulmonary nodules or masses. No focal infiltrates or overt edema. IMPRESSION: No active disease. Electronically Signed   By: Dorise Bullion III M.D   On: 01/22/2017 11:45    EKG: Independently reviewed.  Assessment/Plan Principal Problem:   Cellulitis Active Problems:   Diabetes mellitus, type 2 (HCC)   Morbid obesity ([place])   Hypokalemia   Will admit to floor with IV ABX. Cultures sent. Follow sugars. Supplement K+. Repeat labs in AM  Diet: low carb Fluids: NS with K+@75  DVT Prophylaxis: SQ Heparin  Code Status: FULL  Family Communication: none  Disposition Plan: home  Time spent: 55 min

## 2017-01-22 NOTE — ED Notes (Signed)
RN offered toilet. Pt unable to urinate. RN offered in/out cath but pt refused.

## 2017-01-22 NOTE — Progress Notes (Signed)
Lab called to inform RN that MRSA PCR positive. Pt continued on Contact precautions.

## 2017-01-22 NOTE — ED Triage Notes (Addendum)
Pt to ED by POV with c/o of cellulitis in her right leg. Pt states her PCP prescribed doxycycline but pt states s/s continue to increase. Pt also states that she fell in the shower 3/13 and c/o pain in both legs.

## 2017-01-22 NOTE — Progress Notes (Signed)
Pharmacy Antibiotic Note  Elizabeth [name] is a 66 y.o. female admitted on 01/22/2017 with cellulitis.  Pharmacy has been consulted for Zosyn and vancomyci dosing.  Plan: 1. Zosyn 3.375 gm IV Q8H EI 2. Vancomycin 1 gm IV x 1 followed in approximately 8 hours (stacked dosing) by vancomycin 1 gm IV Q18H, predicted trough 15 mcg/mL. Pharmacy will continue to follow and adjust as needed to maintain trough 10 to 15 mcg/mL.   Vd 45.9 L, Ke 0.052 hr-1, T1/2 13.4 hr  Height: 5\' 3"  (160 cm) Weight: 190 lb (86.2 kg) IBW/kg (Calculated) : 52.4  Temp (24hrs), Avg:101.9 F (38.8 C), Min:101.9 F (38.8 C), Max:101.9 F (38.8 C)   Recent Labs Lab 01/22/17 1123 01/22/17 1146  WBC  --  16.1*  CREATININE  --  1.02*  LATICACIDVEN 1.8  --     Estimated Creatinine Clearance: 57.2 mL/min (A) (by C-G formula based on SCr of 1.02 mg/dL (H)).    No Known Allergies  Thank you for allowing pharmacy to be a part of this patient's care.  Laural Benes, Pharm.D., BCPS Clinical Pharmacist 01/22/2017 1:01 PM

## 2017-01-23 LAB — CBC
HEMATOCRIT: 28.1 % — AB (ref 35.0–47.0)
Hemoglobin: 9.6 g/dL — ABNORMAL LOW (ref 12.0–16.0)
MCH: 29.2 pg (ref 26.0–34.0)
MCHC: 34.3 g/dL (ref 32.0–36.0)
MCV: 85 fL (ref 80.0–100.0)
Platelets: 210 10*3/uL (ref 150–440)
RBC: 3.31 MIL/uL — ABNORMAL LOW (ref 3.80–5.20)
RDW: 12.6 % (ref 11.5–14.5)
WBC: 13.5 10*3/uL — AB (ref 3.6–11.0)

## 2017-01-23 LAB — COMPREHENSIVE METABOLIC PANEL
ALK PHOS: 70 U/L (ref 38–126)
ALT: 22 U/L (ref 14–54)
AST: 32 U/L (ref 15–41)
Albumin: 2.5 g/dL — ABNORMAL LOW (ref 3.5–5.0)
Anion gap: 6 (ref 5–15)
BILIRUBIN TOTAL: 0.6 mg/dL (ref 0.3–1.2)
BUN: 18 mg/dL (ref 6–20)
CHLORIDE: 102 mmol/L (ref 101–111)
CO2: 27 mmol/L (ref 22–32)
Calcium: 7.7 mg/dL — ABNORMAL LOW (ref 8.9–10.3)
Creatinine, Ser: 0.98 mg/dL (ref 0.44–1.00)
GFR, EST NON AFRICAN AMERICAN: 59 mL/min — AB (ref >60)
GLUCOSE: 190 mg/dL — AB (ref 65–99)
Potassium: 3.4 mmol/L — ABNORMAL LOW (ref 3.5–5.1)
Sodium: 135 mmol/L (ref 135–145)
Total Protein: 6.2 g/dL — ABNORMAL LOW (ref 6.5–8.1)

## 2017-01-23 LAB — GLUCOSE, CAPILLARY
GLUCOSE-CAPILLARY: 126 mg/dL — AB (ref 65–99)
Glucose-Capillary: 145 mg/dL — ABNORMAL HIGH (ref 65–99)
Glucose-Capillary: 142 mg/dL — ABNORMAL HIGH (ref 65–99)
Glucose-Capillary: 237 mg/dL — ABNORMAL HIGH (ref 65–99)

## 2017-01-23 LAB — URINALYSIS, COMPLETE (UACMP) WITH MICROSCOPIC
Bilirubin Urine: NEGATIVE
Glucose, UA: NEGATIVE mg/dL
Hgb urine dipstick: NEGATIVE
Ketones, ur: NEGATIVE mg/dL
Nitrite: NEGATIVE
Protein, ur: NEGATIVE mg/dL
Specific Gravity, Urine: 1.016 (ref 1.005–1.030)
pH: 5 (ref 5.0–8.0)

## 2017-01-23 MED ORDER — SODIUM CHLORIDE 0.9 % IV BOLUS (SEPSIS)
250.0000 mL | Freq: Once | INTRAVENOUS | Status: AC
  Administered 2017-01-23: 250 mL via INTRAVENOUS

## 2017-01-23 MED ORDER — IBUPROFEN 400 MG PO TABS
400.0000 mg | ORAL_TABLET | Freq: Once | ORAL | Status: AC
  Administered 2017-01-23: 400 mg via ORAL
  Filled 2017-01-23: qty 1

## 2017-01-23 NOTE — Progress Notes (Signed)
[facility] NAME: Elizabeth [name]    MR#:  [id number]  DATE OF BIRTH:  02/27/1951  Came in with redness of right lower extremity with swelling more than usual fever and found to have elevated white count REVIEW OF SYSTEMS:   Review of Systems  Constitutional: Negative for chills, fever and weight loss.  HENT: Negative for ear discharge, ear pain and nosebleeds.   Eyes: Negative for blurred vision, pain and discharge.  Respiratory: Negative for sputum production, shortness of breath, wheezing and stridor.   Cardiovascular: Positive for leg swelling. Negative for chest pain, palpitations, orthopnea and PND.  Gastrointestinal: Negative for abdominal pain, diarrhea, nausea and vomiting.  Genitourinary: Negative for frequency and urgency.  Musculoskeletal: Negative for back pain and joint pain.  Skin: Positive for rash.  Neurological: Negative for sensory change, speech change, focal weakness and weakness.  Psychiatric/Behavioral: Negative for depression and hallucinations. The patient is not nervous/anxious.    Tolerating Diet:yes Tolerating PT: pending  DRUG ALLERGIES:  No Known Allergies  VITALS:  Blood pressure (!) 93/59, pulse 72, temperature 99.9 F (37.7 C), temperature source Oral, resp. rate 20, height 5\' 3"  (1.6 m), weight 86.2 kg (190 lb), SpO2 97 %.  PHYSICAL EXAMINATION:   Physical Exam  GENERAL:  66 y.o.-year-old patient lying in the bed with no acute distress.  EYES: Pupils equal, round, reactive to light and accommodation. No scleral icterus. Extraocular muscles intact.  HEENT: Head atraumatic, normocephalic. Oropharynx and nasopharynx clear.  NECK:  Supple, no jugular venous distention. No thyroid enlargement, no tenderness.  LUNGS: Normal breath sounds bilaterally, no wheezing, rales, rhonchi. No use of accessory muscles of respiration.  CARDIOVASCULAR: S1, S2 normal. No murmurs, rubs, or gallops.   ABDOMEN: Soft, nontender, nondistended. Bowel sounds present. No organomegaly or mass.  EXTREMITIES: No cyanosis, clubbing    Right lower extremity edema swelling and cellulitis at the right ankle and foot. Marking done with black sharpie  NEUROLOGIC: Cranial nerves II through XII are intact. No focal Motor or sensory deficits b/l.   PSYCHIATRIC:  patient is alert and oriented x 3.  SKIN: No obvious rash, lesion, or ulcer.   LABORATORY PANEL:  CBC  Recent Labs Lab 01/23/17 0338  WBC 13.5*  HGB 9.6*  HCT 28.1*  PLT 210    Chemistries   Recent Labs Lab 01/23/17 0338  NA 135  K 3.4*  CL 102  CO2 27  GLUCOSE 190*  BUN 18  CREATININE 0.98  CALCIUM 7.7*  AST 32  ALT 22  ALKPHOS 70  BILITOT 0.6   Cardiac Enzymes  Recent Labs Lab 01/22/17 1146  TROPONINI 0.03*   RADIOLOGY:  US Venous Img Lower Bilateral  Result Date: 01/22/2017 CLINICAL DATA:  66 year old female with bilateral lower extremity edema EXAM: BILATERAL LOWER EXTREMITY VENOUS DOPPLER ULTRASOUND TECHNIQUE: Gray-scale sonography with graded compression, as well as color Doppler and duplex ultrasound were performed to evaluate the lower extremity deep venous systems from the level of the common femoral vein and including the common femoral, femoral, profunda femoral, popliteal and calf veins including the posterior tibial, peroneal and gastrocnemius veins when visible. The superficial great saphenous vein was also interrogated. Spectral Doppler was utilized to evaluate flow at rest and with distal augmentation maneuvers in the common femoral, femoral and popliteal veins. COMPARISON:  Prior duplex venous ultrasound 01/07/2015 FINDINGS: RIGHT LOWER EXTREMITY Common Femoral Vein: No evidence of thrombus. Normal compressibility, respiratory phasicity and response to augmentation.  Saphenofemoral Junction: No evidence of thrombus. Normal compressibility and flow on color Doppler imaging. Profunda Femoral Vein: No evidence  of thrombus. Normal compressibility and flow on color Doppler imaging. Femoral Vein: No evidence of thrombus. Normal compressibility, respiratory phasicity and response to augmentation. Popliteal Vein: No evidence of thrombus. Normal compressibility, respiratory phasicity and response to augmentation. Calf Veins: Limited visualization of the peroneal veins. No evidence of thrombus in the posterior tibial veins. Superficial Great Saphenous Vein: No evidence of thrombus. Normal compressibility and flow on color Doppler imaging. Venous Reflux:  None. Other Findings:  None. LEFT LOWER EXTREMITY Common Femoral Vein: No evidence of thrombus. Normal compressibility, respiratory phasicity and response to augmentation. Saphenofemoral Junction: No evidence of thrombus. Normal compressibility and flow on color Doppler imaging. Profunda Femoral Vein: No evidence of thrombus. Normal compressibility and flow on color Doppler imaging. Femoral Vein: No evidence of thrombus. Normal compressibility, respiratory phasicity and response to augmentation. Popliteal Vein: No evidence of thrombus. Normal compressibility, respiratory phasicity and response to augmentation. Calf Veins: Limited visualization of the peroneal veins. No evidence of thrombus in the posterior tibial veins. Superficial Great Saphenous Vein: No evidence of thrombus. Normal compressibility and flow on color Doppler imaging. Venous Reflux:  None. Other Findings: Larger reactive lymph nodes in the superficial right inguinal nodal station. IMPRESSION: 1. No evidence of deep venous thrombosis. 2. Reactive lymph nodes in the right superficial inguinal station. Electronically Signed   By: Jacqulynn Cadet M.D.   On: 01/22/2017 13:45   Dg Chest Port 1 View  Result Date: 01/22/2017 CLINICAL DATA:  Cellulitis of right lower leg.  Fever. EXAM: PORTABLE CHEST 1 VIEW COMPARISON:  April 15, 2015 FINDINGS: Stable cardiomegaly. The hila and mediastinum are normal. No  pneumothorax. No pulmonary nodules or masses. No focal infiltrates or overt edema. IMPRESSION: No active disease. Electronically Signed   By: Dorise Bullion III M.D   On: 01/22/2017 11:45   ASSESSMENT AND PLAN:   Elizabeth [name] is a 66 y.o. female has a past medical history significant for breast cancer, HTN, DM ,and lymphedema now with progressive pain, swelling, and redness of RLE with fever despite po Doxycycline. In ER, WBC elevated and pt was febrile.  1. Right lower extremity cellulitis with swelling/edema -Continue vanc and  Zosyn. Once blood cultures are available deescalate Antibiotics -When necessary pain meds -Keep leg elevated -Lower extremity ultrasound negative for DVT  2. Diabetes continue home meds and sliding scale  3. Chronic lymphedema  4.. Hypertension continue home meds  5. DVT prophylaxis Lovenox Case discussed with Care Management/Social Worker. Management plans discussed with the patient, family and they are in agreement.  CODE STATUS: Full code  DVT Prophylaxis: Lovenox TOTAL TIME TAKING CARE OF THIS PATIENT: 25 minutes.  >50% time spent on counselling and coordination of care  POSSIBLE D/C IN one DAYS, DEPENDING ON CLINICAL CONDITION.  Note: This dictation was prepared with Dragon dictation along with smaller phrase technology. Any transcriptional errors that result from this process are unintentional.  Tron Flythe M.D on 01/23/2017 at 12:32 PM  Between 7am to 6pm - Pager - [phone]  After 6pm go to [email or web address] - password [email or web address] [facility]  Office  [phone]  CC: Primary care physician; Marguerita Merles, MD

## 2017-01-23 NOTE — Progress Notes (Signed)
Pt having 2 occurences of diarrhea, temp at 1130 was 99.9, gave 650mg  Tylenol. Pt now is 101.4, BP is 87/44, pt is shivering. MD paged. New orders for 263ml bolus 0.9NS, Give 400mg  ibuprofen one time dose. Obtain specimen for c.diff. Will continue to monitor.

## 2017-01-23 NOTE — Progress Notes (Signed)
Pt temp at recheck 98.4. Pt states she feels much better, no shivering noted, pt sitting up in bed. Will continue to monitor.

## 2017-01-24 ENCOUNTER — Inpatient Hospital Stay: Payer: Medicare HMO

## 2017-01-24 LAB — BASIC METABOLIC PANEL
ANION GAP: 8 (ref 5–15)
BUN: 20 mg/dL (ref 6–20)
CO2: 26 mmol/L (ref 22–32)
CREATININE: 0.91 mg/dL (ref 0.44–1.00)
Calcium: 8.4 mg/dL — ABNORMAL LOW (ref 8.9–10.3)
Chloride: 103 mmol/L (ref 101–111)
GLUCOSE: 151 mg/dL — AB (ref 65–99)
Potassium: 4.4 mmol/L (ref 3.5–5.1)
Sodium: 137 mmol/L (ref 135–145)

## 2017-01-24 LAB — GLUCOSE, CAPILLARY
Glucose-Capillary: 122 mg/dL — ABNORMAL HIGH (ref 65–99)
Glucose-Capillary: 117 mg/dL — ABNORMAL HIGH (ref 65–99)
Glucose-Capillary: 184 mg/dL — ABNORMAL HIGH (ref 65–99)
Glucose-Capillary: 128 mg/dL — ABNORMAL HIGH (ref 65–99)

## 2017-01-24 LAB — SEDIMENTATION RATE: Sed Rate: 126 mm/hr — ABNORMAL HIGH (ref 0–30)

## 2017-01-24 LAB — C-REACTIVE PROTEIN: CRP: 28.3 mg/dL — ABNORMAL HIGH (ref <1.0)

## 2017-01-24 IMAGING — DX DG FOOT 2V*R*
2 series · 2 of 2 positions shown · non-contrast
Comparison: None.

CLINICAL DATA: Diabetic foot infection

EXAM:
RIGHT FOOT - 2 VIEW

[foot ap]
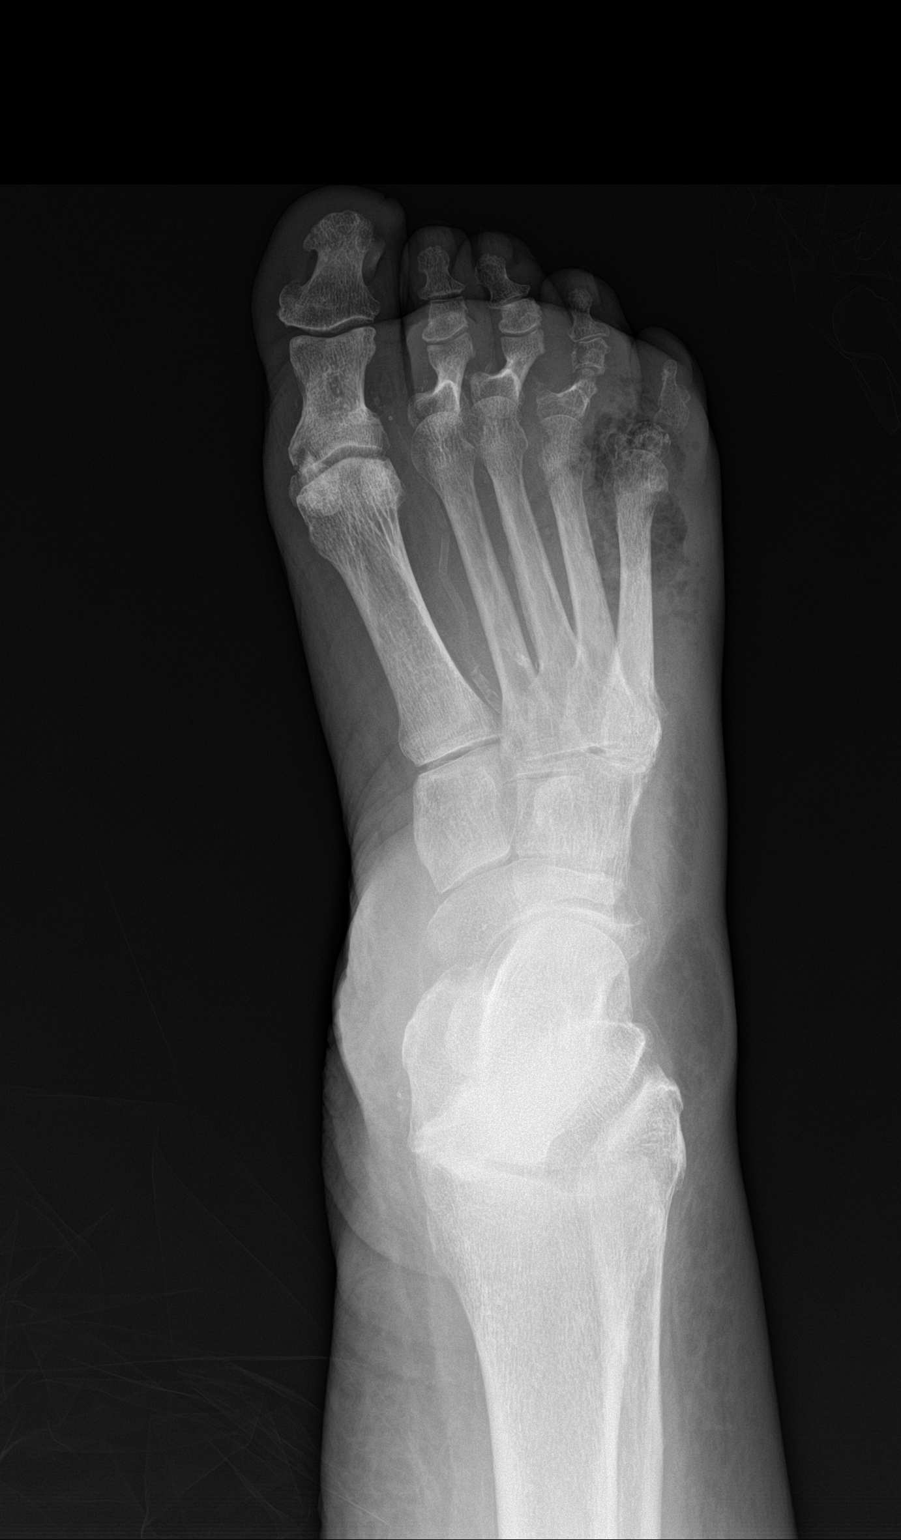

[foot lat]
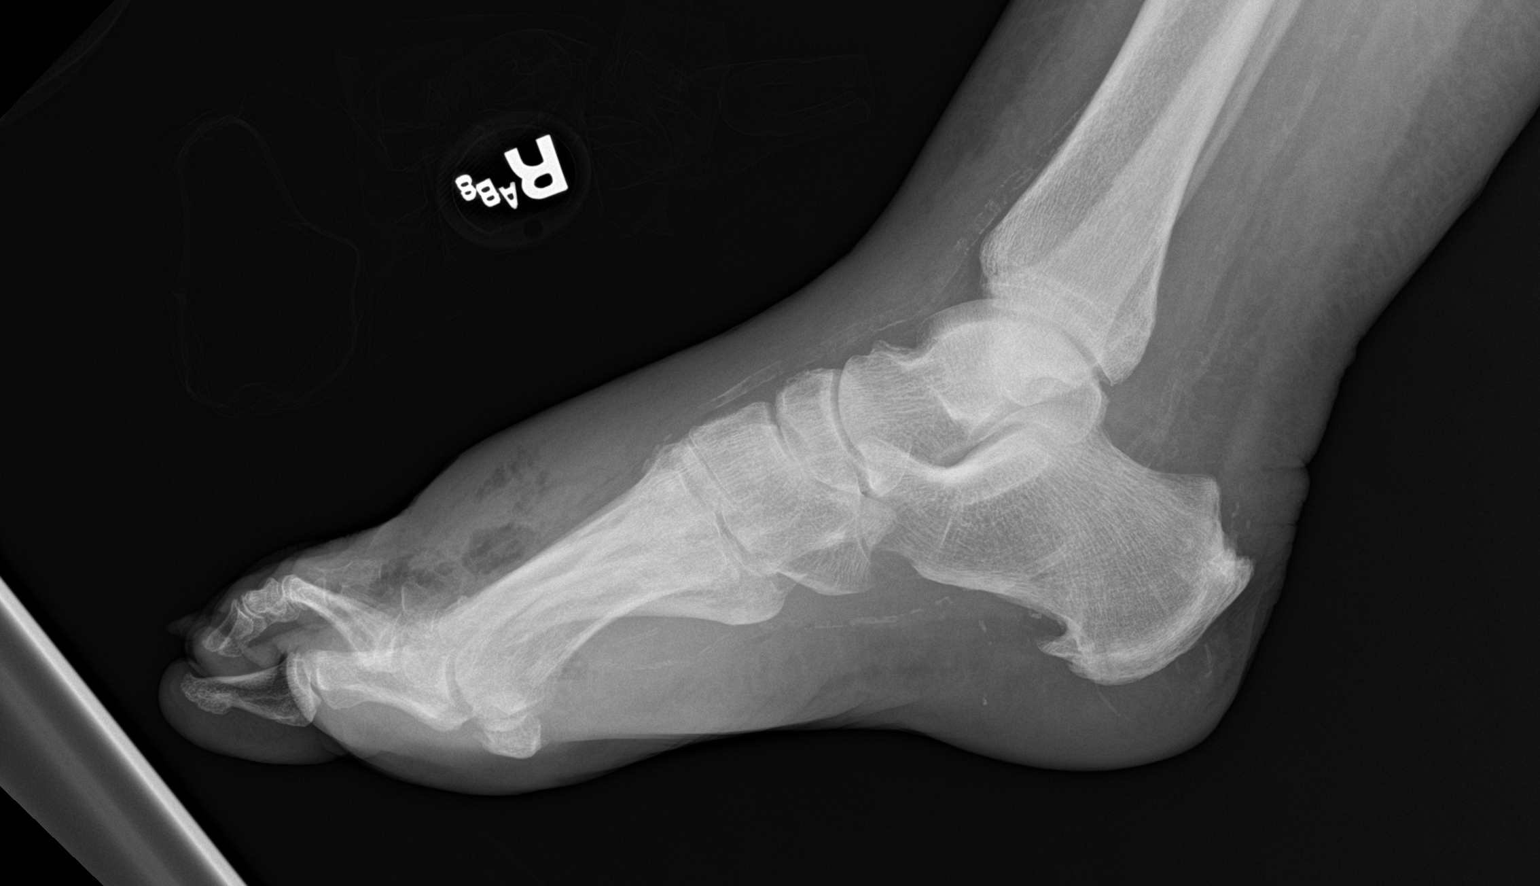

[2 of 2 positions shown; findings below may reference images not displayed]

FINDINGS: Soft tissue swelling and extensive gas dorsal to the fifth
metatarsal phalangeal joint. This obscures bony detail. Cannot
exclude osteomyelitis of the fifth proximal phalanx and/or distal
fifth metatarsal. No other areas of osteomyelitis

Degenerative change in the first MTP. Diffuse arterial
calcification. Calcaneal spurring.
IMPRESSION: Soft tissue swelling and gas surround the fifth metatarsal
phalangeal joint. Probable underlying osteomyelitis not well
visualized due to overlying gas.

## 2017-01-24 MED ORDER — DEXTROSE 5 % IV SOLN
2.0000 g | Freq: Two times a day (BID) | INTRAVENOUS | Status: DC
  Administered 2017-01-25 – 2017-01-27 (×5): 2 g via INTRAVENOUS
  Filled 2017-01-24 (×7): qty 2

## 2017-01-24 MED ORDER — CHLORHEXIDINE GLUCONATE CLOTH 2 % EX PADS
6.0000 | MEDICATED_PAD | Freq: Every day | CUTANEOUS | Status: AC
  Administered 2017-01-25 – 2017-01-28 (×3): 6 via TOPICAL

## 2017-01-24 MED ORDER — METRONIDAZOLE 500 MG PO TABS
500.0000 mg | ORAL_TABLET | Freq: Three times a day (TID) | ORAL | Status: DC
  Administered 2017-01-24 – 2017-01-27 (×7): 500 mg via ORAL
  Filled 2017-01-24 (×7): qty 1

## 2017-01-24 MED ORDER — MUPIROCIN 2 % EX OINT
1.0000 "application " | TOPICAL_OINTMENT | Freq: Two times a day (BID) | CUTANEOUS | Status: AC
  Administered 2017-01-24 – 2017-01-28 (×9): 1 via NASAL
  Filled 2017-01-24: qty 22

## 2017-01-24 NOTE — Progress Notes (Signed)
[facility] [place] NAME: Elizabeth [name]    MR#:  [id number]  DATE OF BIRTH:  May 14, 1951  Came in with redness of right lower extremity with swelling more than usual fever and found to have elevated white count  still running fever, have a dark ulcer and swelling on base of 5th metatarsal and toe.  REVIEW OF SYSTEMS:   Review of Systems  Constitutional: Negative for chills, fever and weight loss.  HENT: Negative for ear discharge, ear pain and nosebleeds.   Eyes: Negative for blurred vision, pain and discharge.  Respiratory: Negative for sputum production, shortness of breath, wheezing and stridor.   Cardiovascular: Positive for leg swelling. Negative for chest pain, palpitations, orthopnea and PND.  Gastrointestinal: Negative for abdominal pain, diarrhea, nausea and vomiting.  Genitourinary: Negative for frequency and urgency.  Musculoskeletal: Negative for back pain and joint pain.  Skin: Positive for rash.  Neurological: Negative for sensory change, speech change, focal weakness and weakness.  Psychiatric/Behavioral: Negative for depression and hallucinations. The patient is not nervous/anxious.    Tolerating Diet:yes Tolerating PT: pending  DRUG ALLERGIES:  No Known Allergies  VITALS:  Blood pressure (!) 107/52, pulse 71, temperature 99.7 F (37.6 C), temperature source Oral, resp. rate 18, height 5\' 3"  (1.6 m), weight 89.4 kg (197 lb), SpO2 91 %.  PHYSICAL EXAMINATION:   Physical Exam  GENERAL:  66 y.o.-year-old patient lying in the bed with no acute distress.  EYES: Pupils equal, round, reactive to light and accommodation. No scleral icterus. Extraocular muscles intact.  HEENT: Head atraumatic, normocephalic. Oropharynx and nasopharynx clear.  NECK:  Supple, no jugular venous distention. No thyroid enlargement, no tenderness.  LUNGS: Normal breath sounds bilaterally, no wheezing, rales, rhonchi. No use of accessory  muscles of respiration.  CARDIOVASCULAR: S1, S2 normal. No murmurs, rubs, or gallops.  ABDOMEN: Soft, nontender, nondistended. Bowel sounds present. No organomegaly or mass.  EXTREMITIES: No cyanosis, clubbing    Right lower extremity edema swelling and cellulitis at the right ankle and foot. Marking done with black sharpie    She has black ulcer at the base of fifth toe with surrounding swelling on foot. NEUROLOGIC: Cranial nerves II through XII are intact. No focal Motor or sensory deficits b/l.   PSYCHIATRIC:  patient is alert and oriented x 3.  SKIN: No obvious rash, lesion, or ulcer.   LABORATORY PANEL:  CBC  Recent Labs Lab 01/23/17 0338  WBC 13.5*  HGB 9.6*  HCT 28.1*  PLT 210    Chemistries   Recent Labs Lab 01/23/17 0338 01/24/17 0343  NA 135 137  K 3.4* 4.4  CL 102 103  CO2 27 26  GLUCOSE 190* 151*  BUN 18 20  CREATININE 0.98 0.91  CALCIUM 7.7* 8.4*  AST 32  --   ALT 22  --   ALKPHOS 70  --   BILITOT 0.6  --    Cardiac Enzymes  Recent Labs Lab 01/22/17 1146  TROPONINI 0.03*   RADIOLOGY:  Dg Foot 2 Views Right  Result Date: 01/24/2017 CLINICAL DATA:  Diabetic foot infection EXAM: RIGHT FOOT - 2 VIEW COMPARISON:  None. FINDINGS: Soft tissue swelling and extensive gas dorsal to the fifth metatarsal phalangeal joint. This obscures bony detail. Cannot exclude osteomyelitis of the fifth proximal phalanx and/or distal fifth metatarsal. No other areas of osteomyelitis Degenerative change in the first MTP. Diffuse arterial calcification. Calcaneal spurring. IMPRESSION: Soft tissue swelling and gas surround the fifth  metatarsal phalangeal joint. Probable underlying osteomyelitis not well visualized due to overlying gas. Electronically Signed   By: Franchot Gallo M.D.   On: 01/24/2017 14:18   ASSESSMENT AND PLAN:   Elizabeth [name] is a 66 y.o. female has a past medical history significant for breast cancer, HTN, DM ,and lymphedema now with progressive pain,  swelling, and redness of RLE with fever despite po Doxycycline. In ER, WBC elevated and pt was febrile.  1. Right lower extremity cellulitis with swelling/edema- possible osteomyelitis. -Continue vanc and  Zosyn. Once blood cultures are available deescalate Antibiotics -When necessary pain meds -Keep leg elevated -Lower extremity ultrasound negative for DVT - Called podiatry consult.  2. Diabetes continue home meds and sliding scale  3. Chronic lymphedema  4.. Hypertension continue home meds  5. DVT prophylaxis Lovenox Case discussed with Care Management/Social Worker. Management plans discussed with the patient, family and they are in agreement.  CODE STATUS: Full code  DVT Prophylaxis: Lovenox TOTAL TIME TAKING CARE OF THIS PATIENT: 35 minutes.  >50% time spent on counselling and coordination of care  POSSIBLE D/C IN 2-3 DAYS, DEPENDING ON CLINICAL CONDITION.  Note: This dictation was prepared with Dragon dictation along with smaller phrase technology. Any transcriptional errors that result from this process are unintentional.  Vaughan Basta M.D on 01/24/2017 at 4:35 PM  Between 7am to 6pm - Pager - [phone]  After 6pm go to [email or web address] - password [email or web address]  Office  [phone]  CC: Primary care physician; Marguerita Merles, MD

## 2017-01-24 NOTE — Consult Note (Signed)
Pharmacy Antibiotic Note  Elizabeth [name] is a 66 y.o. female admitted on 01/22/2017 with RLE cellulitis, chronic diabetic wound, osteomyelitis. Pharmacy has been consulted for cefepime dosing. Pt is also on vancomycin and metronidazole  Plan: cefepime 2g q 12 hours  Continue vancomycin 1g q 18 hours- goal trough should be 15-20 due to diabetic foot infection and osteomyletis. Check trough tomorrow   Height: 5\' 3"  (160 cm) Weight: 197 lb (89.4 kg) IBW/kg (Calculated) : 52.4  Temp (24hrs), Avg:100.6 F (38.1 C), Min:98.9 F (37.2 C), Max:102.9 F (39.4 C)   Recent Labs Lab 01/22/17 1123 01/22/17 1146 01/23/17 0338 01/24/17 0343  WBC  --  16.1* 13.5*  --   CREATININE  --  1.02* 0.98 0.91  LATICACIDVEN 1.8  --   --   --     Estimated Creatinine Clearance: 65.4 mL/min (by C-G formula based on SCr of 0.91 mg/dL).    No Known Allergies  Antimicrobials this admission: vancomycin 3/17 >>  zosyn 3/17 >> 3/19 Cefepime 3/19>> Metronidazole 3/19>>  Dose adjustments this admission:   Microbiology results: 3/17 BCx: NG 2 days  3/17 MRSA PCR: positive 3/19 Aerobic/anerobic;  Thank you for allowing pharmacy to be a part of this patient's care.  Ramond Dial, Pharm.D, BCPS Clinical Pharmacist  01/24/2017 7:42 PM

## 2017-01-24 NOTE — Progress Notes (Signed)
Shift assessment completed by 0800. Pt awake, alert and oriented. Pt is on room air, lungs are clear bilat, telebox in place, Sr noted. Abdomen is soft, bs heard. Pt is wearing incontinence brief, lle is free of edema, ppp. rle is reddened from mid shin to foot, top of foot with edema noted, on bottom of foot below 5 th toe pt has a dry hard area. No ppp but foot is warm with cap refill wnl. PIV #20 intact to bilat ac's, ns with 40 meq kcl infusing at 59mls/hr, sites are free of redness and swelling. Since assessment, pt has had ID consult completed, and podiatry consult completed this evening. Pt incontinent of a large amount of urine this afternoon. Pt had temp of 102.9 early this afternoon and received tylenol po. Family members have been at bedside. Pt has call bell in reach, rle is elevated.

## 2017-01-24 NOTE — Consult Note (Signed)
[facility]     Reason for Consult:cellulitis, fever    Referring Physician: Davy Pique Date of Admission:  01/22/2017   Principal Problem:   Cellulitis Active Problems:   Diabetes mellitus, type 2 ([place])   Morbid obesity ([place])   Hypokalemia   HPI: Elizabeth [name] is a 66 y.o. female admitted with RLE pain and fevers. She was found to have RLE cellulitis. Has a chronic wound. She has hx Dm and Breast cancer. SHe is a very poor historian and is unable to provide much history to me today.     Past Medical History:  Diagnosis Date  . Cancer [facility])    breast  . Carcinoma of right breast ([place]) 05/20/2014  . Diabetes mellitus without complication ([place])   . Hypertension   . Lymphedema   . PONV (postoperative nausea and vomiting)   . Psoriasis 1990  . Stroke ([place]) 2000   residual left arm weakness  . Varicose veins    Past Surgical History:  Procedure Laterality Date  . BREAST EXCISIONAL BIOPSY Right 2015   positive, with radiation  . BREAST LUMPECTOMY Right 04-2014   followed by radiation,  no chemo  . BREAST REDUCTION SURGERY  1977  . CHOLECYSTECTOMY N/A 09/23/2015   Procedure: LAPAROSCOPIC CHOLECYSTECTOMY;  Surgeon: Bonner Puna, MD;  Location: [facility] ORS;  Service: General;  Laterality: N/A;  . DILATION AND CURETTAGE OF UTERUS  2010  . EYE SURGERY Left 2015   cataracts and left eye detached retina repair  . EYE SURGERY Right 2013   cataract with len implant  . GASTRIC ROUX-EN-Y N/A 09/23/2015   Procedure: LAPAROSCOPIC ROUX-EN-Y GASTRIC;  Surgeon: Bonner Puna, MD;  Location: [facility] ORS;  Service: General;  Laterality: N/A;  . HIATAL HERNIA REPAIR N/A 09/23/2015   Procedure: LAPAROSCOPIC REPAIR OF HIATAL HERNIA;  Surgeon: Bonner Puna, MD;  Location: [facility] ORS;  Service: General;  Laterality: N/A;  . REDUCTION MAMMAPLASTY     Social History  Substance Use Topics  . Smoking status: Never Smoker  . Smokeless tobacco: Never Used  . Alcohol use No   Family  History  Problem Relation Age of Onset  . Diabetes Mother   . Cancer Mother   . Breast cancer Mother 22  . Diabetes Father   . Diabetes Sister   . Diabetes Brother     Allergies: No Known Allergies  Current antibiotics: Antibiotics Given (last 72 hours)    Date/Time Action Medication Dose Rate   01/22/17 1736 Given   piperacillin-tazobactam (ZOSYN) IVPB 3.375 g 3.375 g 12.5 mL/hr   01/22/17 2133 Given   vancomycin (VANCOCIN) IVPB 1000 mg/200 mL premix 1,000 mg 200 mL/hr   01/23/17 0902 Given   piperacillin-tazobactam (ZOSYN) IVPB 3.375 g 3.375 g 12.5 mL/hr   01/23/17 1857 Given   piperacillin-tazobactam (ZOSYN) IVPB 3.375 g 3.375 g 12.5 mL/hr   01/24/17 0249 Given   piperacillin-tazobactam (ZOSYN) IVPB 3.375 g 3.375 g 12.5 mL/hr   01/24/17 0745 Given   vancomycin (VANCOCIN) IVPB 1000 mg/200 mL premix 1,000 mg 200 mL/hr   01/24/17 0916 Given   piperacillin-tazobactam (ZOSYN) IVPB 3.375 g 3.375 g 12.5 mL/hr      MEDICATIONS: . aspirin  81 mg Oral Daily  . atenolol  50 mg Oral Daily  . brimonidine  1 drop Left Eye BID  . calcium-vitamin D  2 tablet Oral Q breakfast  . Chlorhexidine Gluconate Cloth  6 each Topical Q0600  . clopidogrel  75 mg  Oral Daily  . docusate sodium  100 mg Oral BID  . famotidine  20 mg Oral BID  . furosemide  20 mg Oral Daily  . gabapentin  300 mg Oral TID  . heparin  5,000 Units Subcutaneous Q8H  . insulin aspart  0-9 Units Subcutaneous TID WC  . letrozole  2.5 mg Oral Daily  . linagliptin  5 mg Oral Daily  . lisinopril  10 mg Oral Daily  . metFORMIN  1,000 mg Oral BID WC  . mupirocin ointment  1 application Nasal BID  . piperacillin-tazobactam (ZOSYN)  IV  3.375 g Intravenous Q8H  . pravastatin  40 mg Oral Daily  . timolol  1 drop Left Eye BID  . vancomycin  1,000 mg Intravenous Q18H    Review of Systems - 11 systems reviewed and negative per HPI   OBJECTIVE: Temp:  [97.8 F (36.6 C)-102.9 F (39.4 C)] 99.7 F (37.6 C) (03/19  0741) Pulse Rate:  [69-79] 71 (03/19 0741) Resp:  [18] 18 (03/19 0421) BP: (101-120)/(52-57) 107/52 (03/19 0741) SpO2:  [91 %-96 %] 91 % (03/19 0741) Weight:  [89.4 kg (197 lb)] 89.4 kg (197 lb) (03/19 0421) Physical Exam  Constitutional:  Chronically ill apperaing HENT: [place]/AT, PERRLA, no scleral icterus Mouth/Throat: Oropharynx is clear and moist. No oropharyngeal exudate.  Cardiovascular: Normal rate, regular rhythm and normal heart sounds. Pulmonary/Chest: Effort normal and breath sounds normal. No respiratory distress.  has no wheezes.  Neck = supple, no nuchal rigidity Abdominal: Soft. Bowel sounds are normal.  exhibits no distension. There is no tenderness.  Lymphadenopathy: no cervical adenopathy. No axillary adenopathy Neurological: alert and oriented to person, place, and time.  Ext RLE with 2+ edema Skin: Plantar wound on L foot with eschar over it bu I am able to express some purulence with air bubbles from it. Marked erythema and induration.  Psychiatric: a normal mood and affect.  behavior is normal.    LABS: Results for orders placed or performed during the hospital encounter of 01/22/17 (from the past 48 hour(s))  Urinalysis, Complete w Microscopic     Status: Abnormal   Collection Time: 01/22/17  9:24 PM  Result Value Ref Range   Color, Urine YELLOW (A) YELLOW   APPearance HAZY (A) CLEAR   Specific Gravity, Urine 1.016 1.005 - 1.030   pH 5.0 5.0 - 8.0   Glucose, UA NEGATIVE NEGATIVE mg/dL   Hgb urine dipstick NEGATIVE NEGATIVE   Bilirubin Urine NEGATIVE NEGATIVE   Ketones, ur NEGATIVE NEGATIVE mg/dL   Protein, ur NEGATIVE NEGATIVE mg/dL   Nitrite NEGATIVE NEGATIVE   Leukocytes, UA MODERATE (A) NEGATIVE   RBC / HPF 0-5 0 - 5 RBC/hpf   WBC, UA TOO NUMEROUS TO COUNT 0 - 5 WBC/hpf   Bacteria, UA RARE (A) NONE SEEN   Squamous Epithelial / LPF 6-30 (A) NONE SEEN   Non Squamous Epithelial 0-5 (A) NONE SEEN  Glucose, capillary     Status: Abnormal   Collection Time:  01/22/17  9:51 PM  Result Value Ref Range   Glucose-Capillary 219 (H) 65 - 99 mg/dL   Comment 1 Notify RN   Comprehensive metabolic panel     Status: Abnormal   Collection Time: 01/23/17  3:38 AM  Result Value Ref Range   Sodium 135 135 - 145 mmol/L   Potassium 3.4 (L) 3.5 - 5.1 mmol/L   Chloride 102 101 - 111 mmol/L   CO2 27 22 - 32 mmol/L   Glucose, Bld  190 (H) 65 - 99 mg/dL   BUN 18 6 - 20 mg/dL   Creatinine, Ser 0.98 0.44 - 1.00 mg/dL   Calcium 7.7 (L) 8.9 - 10.3 mg/dL   Total Protein 6.2 (L) 6.5 - 8.1 g/dL   Albumin 2.5 (L) 3.5 - 5.0 g/dL   AST 32 15 - 41 U/L   ALT 22 14 - 54 U/L   Alkaline Phosphatase 70 38 - 126 U/L   Total Bilirubin 0.6 0.3 - 1.2 mg/dL   GFR calc non Af Amer 59 (L) >60 mL/min   GFR calc Af Amer >60 >60 mL/min    Comment: (NOTE) The eGFR has been calculated using the CKD EPI equation. This calculation has not been validated in all clinical situations. eGFR's persistently <60 mL/min signify possible Chronic Kidney Disease.    Anion gap 6 5 - 15  CBC     Status: Abnormal   Collection Time: 01/23/17  3:38 AM  Result Value Ref Range   WBC 13.5 (H) 3.6 - 11.0 K/uL   RBC 3.31 (L) 3.80 - 5.20 MIL/uL   Hemoglobin 9.6 (L) 12.0 - 16.0 g/dL   HCT 28.1 (L) 35.0 - 47.0 %   MCV 85.0 80.0 - 100.0 fL   MCH 29.2 26.0 - 34.0 pg   MCHC 34.3 32.0 - 36.0 g/dL   RDW 12.6 11.5 - 14.5 %   Platelets 210 150 - 440 K/uL  Glucose, capillary     Status: Abnormal   Collection Time: 01/23/17  7:44 AM  Result Value Ref Range   Glucose-Capillary 145 (H) 65 - 99 mg/dL   Comment 1 Notify RN    Comment 2 Document in Chart   Glucose, capillary     Status: Abnormal   Collection Time: 01/23/17 11:22 AM  Result Value Ref Range   Glucose-Capillary 142 (H) 65 - 99 mg/dL   Comment 1 Notify RN    Comment 2 Document in Chart   Glucose, capillary     Status: Abnormal   Collection Time: 01/23/17  4:23 PM  Result Value Ref Range   Glucose-Capillary 126 (H) 65 - 99 mg/dL   Comment 1  Notify RN    Comment 2 Document in Chart   Glucose, capillary     Status: Abnormal   Collection Time: 01/23/17  9:00 PM  Result Value Ref Range   Glucose-Capillary 237 (H) 65 - 99 mg/dL  Basic metabolic panel     Status: Abnormal   Collection Time: 01/24/17  3:43 AM  Result Value Ref Range   Sodium 137 135 - 145 mmol/L   Potassium 4.4 3.5 - 5.1 mmol/L   Chloride 103 101 - 111 mmol/L   CO2 26 22 - 32 mmol/L   Glucose, Bld 151 (H) 65 - 99 mg/dL   BUN 20 6 - 20 mg/dL   Creatinine, Ser 0.91 0.44 - 1.00 mg/dL   Calcium 8.4 (L) 8.9 - 10.3 mg/dL   GFR calc non Af Amer >60 >60 mL/min   GFR calc Af Amer >60 >60 mL/min    Comment: (NOTE) The eGFR has been calculated using the CKD EPI equation. This calculation has not been validated in all clinical situations. eGFR's persistently <60 mL/min signify possible Chronic Kidney Disease.    Anion gap 8 5 - 15  Glucose, capillary     Status: Abnormal   Collection Time: 01/24/17  7:28 AM  Result Value Ref Range   Glucose-Capillary 122 (H) 65 - 99 mg/dL  Glucose,  capillary     Status: Abnormal   Collection Time: 01/24/17 11:42 AM  Result Value Ref Range   Glucose-Capillary 117 (H) 65 - 99 mg/dL  Glucose, capillary     Status: Abnormal   Collection Time: 01/24/17  4:21 PM  Result Value Ref Range   Glucose-Capillary 184 (H) 65 - 99 mg/dL   No components found for: ESR, C REACTIVE PROTEIN MICRO: Recent Results (from the past 720 hour(s))  Blood Culture (routine x 2)     Status: None (Preliminary result)   Collection Time: 01/22/17 11:46 AM  Result Value Ref Range Status   Specimen Description BLOOD LEFT ASSIST CONTROL  Final   Special Requests BOTTLES DRAWN AEROBIC AND ANAEROBIC BCAV  Final   Culture NO GROWTH 2 DAYS  Final   Report Status PENDING  Incomplete  Blood Culture (routine x 2)     Status: None (Preliminary result)   Collection Time: 01/22/17 11:46 AM  Result Value Ref Range Status   Specimen Description BLOOD RIGHT ASSIST  CONTROL  Final   Special Requests BOTTLES DRAWN AEROBIC AND ANAEROBIC BCAV  Final   Culture NO GROWTH 2 DAYS  Final   Report Status PENDING  Incomplete  MRSA PCR Screening     Status: Abnormal   Collection Time: 01/22/17  4:20 PM  Result Value Ref Range Status   MRSA by PCR POSITIVE (A) NEGATIVE Final    Comment:        The GeneXpert MRSA Assay (FDA approved for NASAL specimens only), is one component of a comprehensive MRSA colonization surveillance program. It is not intended to diagnose MRSA infection nor to guide or monitor treatment for MRSA infections. RESULT CALLED TO, READ BACK BY AND VERIFIED WITH: Talbot Grumbling @ 8250 01/22/17 by [facility]     IMAGING: US Venous Img Lower Bilateral  Result Date: 01/22/2017 CLINICAL DATA:  66 year old female with bilateral lower extremity edema EXAM: BILATERAL LOWER EXTREMITY VENOUS DOPPLER ULTRASOUND TECHNIQUE: Gray-scale sonography with graded compression, as well as color Doppler and duplex ultrasound were performed to evaluate the lower extremity deep venous systems from the level of the common femoral vein and including the common femoral, femoral, profunda femoral, popliteal and calf veins including the posterior tibial, peroneal and gastrocnemius veins when visible. The superficial great saphenous vein was also interrogated. Spectral Doppler was utilized to evaluate flow at rest and with distal augmentation maneuvers in the common femoral, femoral and popliteal veins. COMPARISON:  Prior duplex venous ultrasound 01/07/2015 FINDINGS: RIGHT LOWER EXTREMITY Common Femoral Vein: No evidence of thrombus. Normal compressibility, respiratory phasicity and response to augmentation. Saphenofemoral Junction: No evidence of thrombus. Normal compressibility and flow on color Doppler imaging. Profunda Femoral Vein: No evidence of thrombus. Normal compressibility and flow on color Doppler imaging. Femoral Vein: No evidence of thrombus. Normal compressibility,  respiratory phasicity and response to augmentation. Popliteal Vein: No evidence of thrombus. Normal compressibility, respiratory phasicity and response to augmentation. Calf Veins: Limited visualization of the peroneal veins. No evidence of thrombus in the posterior tibial veins. Superficial Great Saphenous Vein: No evidence of thrombus. Normal compressibility and flow on color Doppler imaging. Venous Reflux:  None. Other Findings:  None. LEFT LOWER EXTREMITY Common Femoral Vein: No evidence of thrombus. Normal compressibility, respiratory phasicity and response to augmentation. Saphenofemoral Junction: No evidence of thrombus. Normal compressibility and flow on color Doppler imaging. Profunda Femoral Vein: No evidence of thrombus. Normal compressibility and flow on color Doppler imaging. Femoral Vein: No evidence of thrombus. Normal compressibility,  respiratory phasicity and response to augmentation. Popliteal Vein: No evidence of thrombus. Normal compressibility, respiratory phasicity and response to augmentation. Calf Veins: Limited visualization of the peroneal veins. No evidence of thrombus in the posterior tibial veins. Superficial Great Saphenous Vein: No evidence of thrombus. Normal compressibility and flow on color Doppler imaging. Venous Reflux:  None. Other Findings: Larger reactive lymph nodes in the superficial right inguinal nodal station. IMPRESSION: 1. No evidence of deep venous thrombosis. 2. Reactive lymph nodes in the right superficial inguinal station. Electronically Signed   By: Jacqulynn Cadet M.D.   On: 01/22/2017 13:45   Dg Chest Port 1 View  Result Date: 01/22/2017 CLINICAL DATA:  Cellulitis of right lower leg.  Fever. EXAM: PORTABLE CHEST 1 VIEW COMPARISON:  April 15, 2015 FINDINGS: Stable cardiomegaly. The hila and mediastinum are normal. No pneumothorax. No pulmonary nodules or masses. No focal infiltrates or overt edema. IMPRESSION: No active disease. Electronically Signed   By:  Dorise Bullion III M.D   On: 01/22/2017 11:45   Dg Foot 2 Views Right  Result Date: 01/24/2017 CLINICAL DATA:  Diabetic foot infection EXAM: RIGHT FOOT - 2 VIEW COMPARISON:  None. FINDINGS: Soft tissue swelling and extensive gas dorsal to the fifth metatarsal phalangeal joint. This obscures bony detail. Cannot exclude osteomyelitis of the fifth proximal phalanx and/or distal fifth metatarsal. No other areas of osteomyelitis Degenerative change in the first MTP. Diffuse arterial calcification. Calcaneal spurring. IMPRESSION: Soft tissue swelling and gas surround the fifth metatarsal phalangeal joint. Probable underlying osteomyelitis not well visualized due to overlying gas. Electronically Signed   By: Franchot Gallo M.D.   On: 01/24/2017 14:18    Assessment:   Elizabeth [name] is a 66 y.o. female with DM, B CA admitted with celluliits and osteomyelitis of R leg. Dr Elvina Mattes has seen her shortly after I did and opened up the abscess at bedside. I sent a culture of some of the drainage. Will need surgery and vascular evaluatioin   Recommendations I have cultured wound Podiatry consult- will likley need surgery Cont vanco Change zosyn to cefepime and flagyl Check esr crp Thank you very much for allowing me to participate in the care of this patient. Please call with questions.   Cheral Marker. Ola Spurr, MD

## 2017-01-24 NOTE — Consult Note (Signed)
Patient Demographics  Elizabeth [name], is a 66 y.o. female   MRN: [id number]   DOB - [birth year]  Admit Date - 01/22/2017    Outpatient Primary MD for the patient is Marguerita Merles, MD  Consult requested in the Hospital by Vaughan Basta, MD, On 01/24/2017    Reason for consult cellulitis abscess right foot   With History of -  Past Medical History:  Diagnosis Date  . Cancer [facility])    breast  . Carcinoma of right breast ([place]) 05/20/2014  . Diabetes mellitus without complication ([place])   . Hypertension   . Lymphedema   . PONV (postoperative nausea and vomiting)   . Psoriasis 1990  . Stroke ([place]) 2000   residual left arm weakness  . Varicose veins       Past Surgical History:  Procedure Laterality Date  . BREAST EXCISIONAL BIOPSY Right 2015   positive, with radiation  . BREAST LUMPECTOMY Right 04-2014   followed by radiation,  no chemo  . BREAST REDUCTION SURGERY  1977  . CHOLECYSTECTOMY N/A 09/23/2015   Procedure: LAPAROSCOPIC CHOLECYSTECTOMY;  Surgeon: Bonner Puna, MD;  Location: [facility] ORS;  Service: General;  Laterality: N/A;  . DILATION AND CURETTAGE OF UTERUS  2010  . EYE SURGERY Left 2015   cataracts and left eye detached retina repair  . EYE SURGERY Right 2013   cataract with len implant  . GASTRIC ROUX-EN-Y N/A 09/23/2015   Procedure: LAPAROSCOPIC ROUX-EN-Y GASTRIC;  Surgeon: Bonner Puna, MD;  Location: [facility] ORS;  Service: General;  Laterality: N/A;  . HIATAL HERNIA REPAIR N/A 09/23/2015   Procedure: LAPAROSCOPIC REPAIR OF HIATAL HERNIA;  Surgeon: Bonner Puna, MD;  Location: [facility] ORS;  Service: General;  Laterality: N/A;  . REDUCTION MAMMAPLASTY      in for   Chief Complaint  Patient presents with  . Cellulitis     HPI  Elizabeth [name]  is a 66 y.o. female, Patient states the  blister developed overnight on the plantar right foot according to the patient. She secondarily developed cellulitis and was brought to the hospital Saturday admitted his dental antibiotics since that time. She states the redness to the foot does look a little better than it did yesterday. Markings on the foot showing cellulitis seems to have retreated from the marking areas. She is uncertain as to how this developed. She is diabetic. She's had a history of ulceration on the right foot.    Review of Systems    In addition to the HPI above,  Patient complains of periodic fever and chills for today than yesterday. No Headache, No changes with Vision or hearing, No problems swallowing food or Liquids, No Chest pain, Cough or Shortness of Breath, No Abdominal pain, No Nausea or Vommitting, Bowel movements are regular, No Blood in stool or Urine, No dysuria, Patient complains of a rash No new joints pains-aches,  No new weakness, tingling, numbness in any extremity, No recent weight gain or loss, No polyuria, polydypsia or polyphagia, No significant Mental Stressors.  A full 10 point Review of Systems was done, except as stated above, all other Review of Systems were negative.   Social History Social  History  Substance Use Topics  . Smoking status: Never Smoker  . Smokeless tobacco: Never Used  . Alcohol use No     Family History Family History  Problem Relation Age of Onset  . Diabetes Mother   . Cancer Mother   . Breast cancer Mother 35  . Diabetes Father   . Diabetes Sister   . Diabetes Brother      Prior to Admission medications   Medication Sig Start Date End Date Taking? Authorizing Provider  albuterol (PROVENTIL HFA;VENTOLIN HFA) 108 (90 BASE) MCG/ACT inhaler Inhale 2 puffs into the lungs every 6 (six) hours as needed for wheezing or shortness of breath.   Yes Historical Provider, MD  calcium-vitamin D (OSCAL WITH D) 500-200 MG-UNIT tablet Take 2 tablets by mouth  daily with breakfast. 11/23/16  Yes Lloyd Huger, MD  clopidogrel (PLAVIX) 75 MG tablet Take 75 mg by mouth. 11/06/15  Yes Historical Provider, MD  furosemide (LASIX) 20 MG tablet Take 20 mg by mouth daily.   Yes Historical Provider, MD  gabapentin (NEURONTIN) 300 MG capsule Take 300 mg by mouth 3 (three) times daily.   Yes Historical Provider, MD  letrozole (FEMARA) 2.5 MG tablet Take 1 tablet (2.5 mg total) by mouth daily. 11/23/16  Yes Lloyd Huger, MD  linagliptin (TRADJENTA) 5 MG TABS tablet Take 5 mg by mouth daily.   Yes Historical Provider, MD  lisinopril (PRINIVIL,ZESTRIL) 10 MG tablet Take 10 mg by mouth daily.   Yes Historical Provider, MD  metFORMIN (GLUCOPHAGE) 1000 MG tablet Take 1,000 mg by mouth 2 (two) times daily with a meal.   Yes Historical Provider, MD  pravastatin (PRAVACHOL) 40 MG tablet Take 40 mg by mouth daily. In afternoon   Yes Historical Provider, MD  aspirin 81 MG tablet Take 81 mg by mouth daily. Reported on 05/25/2016    Historical Provider, MD  atenolol (TENORMIN) 50 MG tablet Take 50 mg by mouth daily. Reported on 05/25/2016    Historical Provider, MD  brimonidine (ALPHAGAN) 0.2 % ophthalmic solution Place 1 drop into the left eye 2 (two) times daily. Reported on 05/25/2016    Historical Provider, MD  insulin lispro (HUMALOG) 100 UNIT/ML injection Inject into the skin 3 (three) times daily before meals. Reported on 05/25/2016    Historical Provider, MD  ranitidine (ZANTAC) 150 MG tablet Take 150 mg by mouth 2 (two) times daily. Reported on 05/25/2016    Historical Provider, MD  timolol (BETIMOL) 0.5 % ophthalmic solution Place 1 drop into the left eye 2 (two) times daily. Reported on 05/25/2016    Historical Provider, MD    Anti-infectives    Start     Dose/Rate Route Frequency Ordered Stop   01/24/17 1800  ceFEPIme (MAXIPIME) 2 g in dextrose 5 % 50 mL IVPB     2 g 100 mL/hr over 30 Minutes Intravenous Every 12 hours 01/24/17 1747     01/24/17 1730   metroNIDAZOLE (FLAGYL) tablet 500 mg     500 mg Oral Every 8 hours 01/24/17 1713     01/22/17 2000  vancomycin (VANCOCIN) IVPB 1000 mg/200 mL premix     1,000 mg 200 mL/hr over 60 Minutes Intravenous Every 18 hours 01/22/17 1650     01/22/17 1800  piperacillin-tazobactam (ZOSYN) IVPB 3.375 g  Status:  Discontinued     3.375 g 12.5 mL/hr over 240 Minutes Intravenous Every 8 hours 01/22/17 1650 01/24/17 1713   01/22/17 1130  piperacillin-tazobactam (ZOSYN) IVPB 3.375 g  3.375 g 100 mL/hr over 30 Minutes Intravenous  Once 01/22/17 1123 01/22/17 1235   01/22/17 1130  vancomycin (VANCOCIN) IVPB 1000 mg/200 mL premix     1,000 mg 200 mL/hr over 60 Minutes Intravenous  Once 01/22/17 1123 01/22/17 1357      Scheduled Meds: . aspirin  81 mg Oral Daily  . atenolol  50 mg Oral Daily  . brimonidine  1 drop Left Eye BID  . calcium-vitamin D  2 tablet Oral Q breakfast  . ceFEPIme (MAXIPIME) 2 GM IVP  2 g Intravenous Q12H  . Chlorhexidine Gluconate Cloth  6 each Topical Q0600  . clopidogrel  75 mg Oral Daily  . docusate sodium  100 mg Oral BID  . famotidine  20 mg Oral BID  . furosemide  20 mg Oral Daily  . gabapentin  300 mg Oral TID  . heparin  5,000 Units Subcutaneous Q8H  . insulin aspart  0-9 Units Subcutaneous TID WC  . letrozole  2.5 mg Oral Daily  . linagliptin  5 mg Oral Daily  . lisinopril  10 mg Oral Daily  . metFORMIN  1,000 mg Oral BID WC  . metroNIDAZOLE  500 mg Oral Q8H  . mupirocin ointment  1 application Nasal BID  . pravastatin  40 mg Oral Daily  . timolol  1 drop Left Eye BID  . vancomycin  1,000 mg Intravenous Q18H   Continuous Infusions: . 0.9 % NaCl with KCl 40 mEq / L 75 mL/hr at 01/24/17 1200   PRN Meds:.acetaminophen **OR** acetaminophen, albuterol, bisacodyl, morphine injection, ondansetron **OR** ondansetron (ZOFRAN) IV  No Known Allergies  Physical Exam  Vitals  Blood pressure (!) 107/52, pulse 71, temperature 99.7 F (37.6 C), temperature source  Oral, resp. rate 18, height 5\' 3"  (1.6 m), weight 89.4 kg (197 lb), SpO2 91 %.  Lower Extremity exam:  Vascular:DP PT pulses were difficult to palpate but loss swelling secondary to her infection. Vascular consult was called and  Dermatological: Patient has a wound submetatarsal 5 on the right foot there is necrotic deep soft tissue extruding from the wound at this timeframe. There is a foul odor associated with this. Wound probes very deep. The right foot is cellulitic on the top of the foot extending to the ankle. There is a line of demarcation from the previous marking in the OR that appears to have retracted some. Patient states that looks better today.  Neurological: Peripheral neuropathy  Ortho: Patient has crepitus tissues over the right fifth metatarsal dorsally. I used a blunt and sharp probe I removed some of the necrotic tissue extruding from the wound and probed the wound all the way through the metatarsal phalangeal joint up to the dorsal aspect of the foot. This done after sterile Betadine prep to the foot. The wound was then opened dorsally and extensive purulent drainage was removed that had a foul odor consistent with some  anaerobic activity. Tissue necrosis is noted plantarly. Previous x-rays showed likely destructive changes to the metatarsal head and base of the proximal phalanx of the fifth toe. Also gas in tissues dorsally which should be exposed and released with the debridement and probing and opening up of the wound dorsally to allow for drainage.  Data Review  CBC  Recent Labs Lab 01/22/17 1146 01/23/17 0338  WBC 16.1* 13.5*  HGB 10.7* 9.6*  HCT 31.5* 28.1*  PLT 261 210  MCV 83.8 85.0  MCH 28.5 29.2  MCHC 34.0 34.3  RDW 12.6 12.6  LYMPHSABS 0.6*  --   MONOABS 1.1*  --   EOSABS 0.0  --   BASOSABS 0.1  --    ------------------------------------------------------------------------------------------------------------------  Chemistries   Recent Labs Lab  01/22/17 1146 01/23/17 0338 01/24/17 0343  NA 132* 135 137  K 2.9* 3.4* 4.4  CL 95* 102 103  CO2 29 27 26   GLUCOSE 259* 190* 151*  BUN 22* 18 20  CREATININE 1.02* 0.98 0.91  CALCIUM 8.5* 7.7* 8.4*  AST 36 32  --   ALT 24 22  --   ALKPHOS 79 70  --   BILITOT 0.6 0.6  --    ------------------------------------------------------------------------------------------------------------------ estimated creatinine clearance is 65.4 mL/min (by C-G formula based on SCr of 0.91 mg/dL). ------------------------------------------------------------------------------------------------------------------ No results for input(s): TSH, T4TOTAL, T3FREE, THYROIDAB in the last 72 hours.  Invalid input(s): FREET3   Coagulation profile No results for input(s): INR, PROTIME in the last 168 hours. ------------------------------------------------------------------------------------------------------------------- No results for input(s): DDIMER in the last 72 hours. -------------------------------------------------------------------------------------------------------------------  Cardiac Enzymes  Recent Labs Lab 01/22/17 1146  TROPONINI 0.03*   ------------------------------------------------------------------------------------------------------------------ Invalid input(s): POCBNP   ---------------------------------------------------------------------------------------------------------------  Urinalysis    Component Value Date/Time   COLORURINE YELLOW (A) 01/22/2017 2124   APPEARANCEUR HAZY (A) 01/22/2017 2124   APPEARANCEUR Cloudy 01/05/2012 1249   LABSPEC 1.016 01/22/2017 2124   LABSPEC 1.027 01/05/2012 1249   PHURINE 5.0 01/22/2017 2124   GLUCOSEU NEGATIVE 01/22/2017 2124   GLUCOSEU 150 mg/dL 01/05/2012 1249   HGBUR NEGATIVE 01/22/2017 2124   BILIRUBINUR NEGATIVE 01/22/2017 2124   BILIRUBINUR Negative 01/05/2012 [place] 01/22/2017 2124   PROTEINUR NEGATIVE  01/22/2017 2124   NITRITE NEGATIVE 01/22/2017 2124   LEUKOCYTESUR MODERATE (A) 01/22/2017 2124   LEUKOCYTESUR Negative 01/05/2012 1249     Imaging results:   Dg Foot 2 Views Right  Result Date: 01/24/2017 CLINICAL DATA:  Diabetic foot infection EXAM: RIGHT FOOT - 2 VIEW COMPARISON:  None. FINDINGS: Soft tissue swelling and extensive gas dorsal to the fifth metatarsal phalangeal joint. This obscures bony detail. Cannot exclude osteomyelitis of the fifth proximal phalanx and/or distal fifth metatarsal. No other areas of osteomyelitis Degenerative change in the first MTP. Diffuse arterial calcification. Calcaneal spurring. IMPRESSION: Soft tissue swelling and gas surround the fifth metatarsal phalangeal joint. Probable underlying osteomyelitis not well visualized due to overlying gas. Electronically Signed   By: Franchot Gallo M.D.   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 Gas in the tissues dorsally. Cellulitis noted to the right foot and ankle.  Plan: Today after sterile Betadine prep to the region I used a probe and scissors to remove some necrotic tissue that was extruded from the wound. I then probed deep through the joints very little resistance from a cat of bone to the region and open the crepitus area up on the some of the foot. The area was opened up approximately 1 cm dorsally with compression approximately 6 cc of pus and hemorrhagic tissue was removed from the area. There was then packed with iodoform gauze and a sterile dressing was placed across the area. We will set patient up for surgery tomorrow for more formal debridement of the infected area. She very well may need a fifth ray amputation in order to resolve this problem on a reasonable basis. Did explain to the patient that this is a  severe problem and she is at risk  of limb loss. Vascular surgery is also been consulted as has infectious disease. Ultimately the infection needs to be brought under control better. This can only be done with appropriate debridement and removal of infected tissues. If she needs a vascular procedure it will likely be afterwards. My procedure needs to be done however in order stabilize and start see improvement of the infection. She does appear to be responding to the antibiotics and white count has dropped some since yesterday.  Principal Problem:   Cellulitis Active Problems:   Diabetes mellitus, type 2 (HCC)   Morbid obesity ([place])   Hypokalemia  Family Communication: Plan discussed with patient and family  Perry Mount M.D on 01/24/2017 at 6:49 PM  Thank you for the consult, we will follow the patient with you in the Hospital.

## 2017-01-24 NOTE — Plan of Care (Signed)
Problem: Bowel/Gastric: Goal: Will not experience complications related to bowel motility Outcome: Progressing Pt is progressing toward goals, is aware of plan for surgical debridement of her foot tomorrow.

## 2017-01-25 ENCOUNTER — Inpatient Hospital Stay: Payer: Medicare HMO | Admitting: Certified Registered Nurse Anesthetist

## 2017-01-25 ENCOUNTER — Encounter
Admission: EM | Disposition: A | Discharge status: Skilled Nursing Facility | Payer: Self-pay | Source: Home / Self Care | Attending: Specialist

## 2017-01-25 ENCOUNTER — Encounter: Payer: Self-pay | Admitting: *Deleted

## 2017-01-25 DIAGNOSIS — R600 Localized edema: Secondary | ICD-10-CM

## 2017-01-25 DIAGNOSIS — R509 Fever, unspecified: Secondary | ICD-10-CM

## 2017-01-25 DIAGNOSIS — M79604 Pain in right leg: Secondary | ICD-10-CM

## 2017-01-25 DIAGNOSIS — E11621 Type 2 diabetes mellitus with foot ulcer: Secondary | ICD-10-CM

## 2017-01-25 HISTORY — PX: IRRIGATION AND DEBRIDEMENT FOOT: SHX6602

## 2017-01-25 HISTORY — PX: AMPUTATION TOE: SHX6595

## 2017-01-25 LAB — URINE CULTURE: Culture: NO GROWTH

## 2017-01-25 LAB — GLUCOSE, CAPILLARY
GLUCOSE-CAPILLARY: 123 mg/dL — AB (ref 65–99)
GLUCOSE-CAPILLARY: 119 mg/dL — AB (ref 65–99)
GLUCOSE-CAPILLARY: 114 mg/dL — AB (ref 65–99)
GLUCOSE-CAPILLARY: 109 mg/dL — AB (ref 65–99)
GLUCOSE-CAPILLARY: 164 mg/dL — AB (ref 65–99)
Glucose-Capillary: 109 mg/dL — ABNORMAL HIGH (ref 65–99)

## 2017-01-25 LAB — C DIFFICILE QUICK SCREEN W PCR REFLEX
C DIFFICILE (CDIFF) INTERP: NOT DETECTED
C Diff antigen: NEGATIVE
C Diff toxin: NEGATIVE

## 2017-01-25 LAB — VANCOMYCIN, TROUGH: Vancomycin Tr: 14 ug/mL — ABNORMAL LOW (ref 15–20)

## 2017-01-25 SURGERY — IRRIGATION AND DEBRIDEMENT FOOT
Anesthesia: General | Laterality: Right

## 2017-01-25 MED ORDER — VANCOMYCIN HCL IN DEXTROSE 1-5 GM/200ML-% IV SOLN
1000.0000 mg | Freq: Two times a day (BID) | INTRAVENOUS | Status: DC
  Administered 2017-01-26 – 2017-01-27 (×3): 1000 mg via INTRAVENOUS
  Filled 2017-01-25 (×4): qty 200

## 2017-01-25 MED ORDER — INSULIN ASPART 100 UNIT/ML ~~LOC~~ SOLN
0.0000 [IU] | Freq: Three times a day (TID) | SUBCUTANEOUS | Status: DC
  Administered 2017-01-26: 3 [IU] via SUBCUTANEOUS
  Administered 2017-01-27 – 2017-01-30 (×3): 2 [IU] via SUBCUTANEOUS
  Filled 2017-01-25: qty 3
  Filled 2017-01-25 (×2): qty 2

## 2017-01-25 MED ORDER — SUCCINYLCHOLINE CHLORIDE 20 MG/ML IJ SOLN
INTRAMUSCULAR | Status: DC | PRN
  Administered 2017-01-25: 100 mg via INTRAVENOUS

## 2017-01-25 MED ORDER — FENTANYL CITRATE (PF) 100 MCG/2ML IJ SOLN: 25.0000 ug | INTRAMUSCULAR | Status: DC | PRN

## 2017-01-25 MED ORDER — LACTATED RINGERS IV SOLN
INTRAVENOUS | Status: DC | PRN
  Administered 2017-01-25: 12:00:00 via INTRAVENOUS

## 2017-01-25 MED ORDER — MEPERIDINE HCL 50 MG/ML IJ SOLN: 6.2500 mg | INTRAMUSCULAR | Status: DC | PRN

## 2017-01-25 MED ORDER — OXYCODONE HCL 5 MG PO TABS: 5.0000 mg | ORAL_TABLET | Freq: Once | ORAL | Status: DC | PRN

## 2017-01-25 MED ORDER — FENTANYL CITRATE (PF) 100 MCG/2ML IJ SOLN
INTRAMUSCULAR | Status: AC
  Filled 2017-01-25: qty 2

## 2017-01-25 MED ORDER — SODIUM CHLORIDE 0.9 % IV SOLN
INTRAVENOUS | Status: DC
  Administered 2017-01-25: 100 mL/h via INTRAVENOUS

## 2017-01-25 MED ORDER — ONDANSETRON HCL 4 MG/2ML IJ SOLN
INTRAMUSCULAR | Status: DC | PRN
  Administered 2017-01-25: 4 mg via INTRAVENOUS

## 2017-01-25 MED ORDER — PROPOFOL 10 MG/ML IV BOLUS
INTRAVENOUS | Status: AC
  Filled 2017-01-25: qty 20

## 2017-01-25 MED ORDER — OXYCODONE HCL 5 MG/5ML PO SOLN: 5.0000 mg | Freq: Once | ORAL | Status: DC | PRN

## 2017-01-25 MED ORDER — ONDANSETRON HCL 4 MG/2ML IJ SOLN
INTRAMUSCULAR | Status: AC
  Filled 2017-01-25: qty 2

## 2017-01-25 MED ORDER — PROMETHAZINE HCL 25 MG/ML IJ SOLN: 6.2500 mg | INTRAMUSCULAR | Status: DC | PRN

## 2017-01-25 MED ORDER — FENTANYL CITRATE (PF) 100 MCG/2ML IJ SOLN
INTRAMUSCULAR | Status: DC | PRN
  Administered 2017-01-25: 25 ug via INTRAVENOUS
  Administered 2017-01-25: 75 ug via INTRAVENOUS
  Administered 2017-01-25: 25 ug via INTRAVENOUS

## 2017-01-25 MED ORDER — PROPOFOL 10 MG/ML IV BOLUS
INTRAVENOUS | Status: DC | PRN
  Administered 2017-01-25: 150 mg via INTRAVENOUS

## 2017-01-25 SURGICAL SUPPLY — 47 items
BAG COUNTER SPONGE EZ (MISCELLANEOUS) IMPLANT
BANDAGE ELASTIC 3 LF NS (GAUZE/BANDAGES/DRESSINGS) ×3 IMPLANT
BANDAGE ELASTIC 4 LF NS (GAUZE/BANDAGES/DRESSINGS) ×3 IMPLANT
BANDAGE STRETCH 3X4.1 STRL (GAUZE/BANDAGES/DRESSINGS) ×3 IMPLANT
BLADE MED AGGRESSIVE (BLADE) ×3 IMPLANT
BLADE SURG 15 STRL LF DISP TIS (BLADE) ×4 IMPLANT
BLADE SURG 15 STRL SS (BLADE) ×8
BNDG ESMARK 4X12 TAN STRL LF (GAUZE/BANDAGES/DRESSINGS) ×3 IMPLANT
BNDG GAUZE 4.5X4.1 6PLY STRL (MISCELLANEOUS) ×3 IMPLANT
CANISTER SUCT 1200ML W/VALVE (MISCELLANEOUS) ×3 IMPLANT
COUNTER SPONGE BAG EZ (MISCELLANEOUS)
CUFF TOURN 18 STER (MISCELLANEOUS) ×3 IMPLANT
CUFF TOURN DUAL PL 12 NO SLV (MISCELLANEOUS) IMPLANT
DRAPE FLUOR MINI C-ARM 54X84 (DRAPES) IMPLANT
DURAPREP 26ML APPLICATOR (WOUND CARE) ×3 IMPLANT
ELECT REM PT RETURN 9FT ADLT (ELECTROSURGICAL) ×3
ELECTRODE REM PT RTRN 9FT ADLT (ELECTROSURGICAL) ×1 IMPLANT
GAUZE PETRO XEROFOAM 1X8 (MISCELLANEOUS) ×3 IMPLANT
GAUZE SPONGE 4X4 12PLY STRL (GAUZE/BANDAGES/DRESSINGS) ×3 IMPLANT
GLOVE BIO SURGEON STRL SZ8 (GLOVE) ×3 IMPLANT
GLOVE INDICATOR 8.0 STRL GRN (GLOVE) ×3 IMPLANT
GOWN STRL REUS W/ TWL LRG LVL3 (GOWN DISPOSABLE) ×2 IMPLANT
GOWN STRL REUS W/TWL LRG LVL3 (GOWN DISPOSABLE) ×4
KIT RM TURNOVER STRD PROC AR (KITS) ×3 IMPLANT
LABEL OR SOLS (LABEL) IMPLANT
NDL SAFETY 18GX1.5 (NEEDLE) ×3 IMPLANT
NEEDLE FILTER BLUNT 18X 1/2SAF (NEEDLE) ×2
NEEDLE FILTER BLUNT 18X1 1/2 (NEEDLE) ×1 IMPLANT
NEEDLE HYPO 25X1 1.5 SAFETY (NEEDLE) ×9 IMPLANT
NS IRRIG 500ML POUR BTL (IV SOLUTION) ×3 IMPLANT
PACK EXTREMITY ARMC (MISCELLANEOUS) ×3 IMPLANT
PENCIL ELECTRO HAND CTR (MISCELLANEOUS) ×3 IMPLANT
RASP SM TEAR CROSS CUT (RASP) IMPLANT
STOCKINETTE STRL 6IN 960660 (GAUZE/BANDAGES/DRESSINGS) ×3 IMPLANT
SUT ETH BLK MONO 3 0 FS 1 12/B (SUTURE) IMPLANT
SUT ETHILON 3-0 FS-10 30 BLK (SUTURE) ×3
SUT ETHILON 4-0 (SUTURE)
SUT ETHILON 4-0 FS2 18XMFL BLK (SUTURE)
SUT ETHILON 5 0 PS 2 18 (SUTURE) ×3 IMPLANT
SUT VIC AB 3-0 SH 27 (SUTURE) ×2
SUT VIC AB 3-0 SH 27X BRD (SUTURE) ×1 IMPLANT
SUT VIC AB 4-0 FS2 27 (SUTURE) ×3 IMPLANT
SUTURE EHLN 3-0 FS-10 30 BLK (SUTURE) ×1 IMPLANT
SUTURE ETHLN 4-0 FS2 18XMF BLK (SUTURE) IMPLANT
SWAB CULTURE AMIES ANAERIB BLU (MISCELLANEOUS) ×3 IMPLANT
SYR 3ML LL SCALE MARK (SYRINGE) ×3 IMPLANT
SYRINGE 10CC LL (SYRINGE) ×3 IMPLANT

## 2017-01-25 NOTE — Consult Note (Signed)
Pharmacy Antibiotic Note  Elizabeth [name] is a 66 y.o. female admitted on 01/22/2017 with RLE cellulitis, chronic diabetic wound, osteomyelitis. Pharmacy has been consulted for cefepime dosing. Pt is also on vancomycin and metronidazole  Plan: cefepime 2g q 12 hours  Vancomycin trough low at 14. Pt has necrotizing infection and osteomyelitis of R foot there goal trough 15-20. Will change interval from 1g q 18 to 1g q 12 hours. Recheck trough after 3 new doses  Height: 5\' 3"  (160 cm) Weight: 200 lb (90.7 kg) IBW/kg (Calculated) : 52.4  Temp (24hrs), Avg:100.2 F (37.9 C), Min:97.4 F (36.3 C), Max:103.1 F (39.5 C)   Recent Labs Lab 01/22/17 1123 01/22/17 1146 01/23/17 0338 01/24/17 0343 01/25/17 1925  WBC  --  16.1* 13.5*  --   --   CREATININE  --  1.02* 0.98 0.91  --   LATICACIDVEN 1.8  --   --   --   --   VANCOTROUGH  --   --   --   --  14*    Estimated Creatinine Clearance: 65.9 mL/min (by C-G formula based on SCr of 0.91 mg/dL).    No Known Allergies  Antimicrobials this admission: vancomycin 3/17 >>  zosyn 3/17 >> 3/19 Cefepime 3/19>> Metronidazole 3/19>>  Dose adjustments this admission:   Microbiology results: 3/17 BCx: NG 2 days  3/17 MRSA PCR: positive 3/19 Aerobic/anerobic;  Thank you for allowing pharmacy to be a part of this patient's care.  Ramond Dial, Pharm.D, BCPS Clinical Pharmacist  01/25/2017 8:40 PM

## 2017-01-25 NOTE — Anesthesia Procedure Notes (Signed)

## 2017-01-25 NOTE — Consult Note (Signed)
[facility] Consult Note  MRN : [id number]  Elizabeth [name] is a 66 y.o. (11/26/1950) female who presents with chief complaint of  Chief Complaint  Patient presents with  . Cellulitis  .  History of Present Illness: I am asked to see the patient by Dr. 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  The patient is a 66 y.o. female has a past medical history significant for breast cancer, HTN, DM ,and lymphedema who presented to [facility] 3 days ago with progressive pain, swelling, and redness of RLE. The patient noted at that time that her legs seem to worsen despite a course of po Doxycycline. In ER, WBC elevated and pt was febrile and so the patient was admitted.  Earlier this morning the patient was taken to the operating room where Dr. Elvina Mattes debrided her right foot. 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  Patient states she has been seen by a vascular surgeon both in [place] as well as at [facility]. However it has been several years since her last visit. She does note that at that time the test they did were all okay and they told her she was fine.  Current Facility-Administered Medications  Medication Dose Route Frequency Provider Last Rate Last Dose  . 0.9 %  sodium chloride infusion   Intravenous Continuous Amy Penwarden, MD 100 mL/hr at 01/25/17 1200 100 mL/hr at 01/25/17 1200  . 0.9 % NaCl with KCl 40 mEq / L  infusion   Intravenous Continuous Idelle Crouch, MD 75 mL/hr at 01/24/17 2312 75 mL/hr at 01/24/17 2312  . acetaminophen (TYLENOL) tablet 650 mg  650 mg Oral Q6H PRN Idelle Crouch, MD   650 mg at 01/25/17 0002   Or  . acetaminophen (TYLENOL) suppository 650 mg  650 mg Rectal Q6H PRN Idelle Crouch, MD      . albuterol (PROVENTIL) (2.5 MG/3ML) 0.083%  nebulizer solution 3 mL  3 mL Inhalation Q6H PRN Idelle Crouch, MD   3 mL at 01/25/17 0854  . aspirin chewable tablet 81 mg  81 mg Oral Daily Idelle Crouch, MD   81 mg at 01/24/17 3154  . atenolol (TENORMIN) tablet 50 mg  50 mg Oral Daily Idelle Crouch, MD   50 mg at 01/25/17 0920  . bisacodyl (DULCOLAX) suppository 10 mg  10 mg Rectal Daily PRN Idelle Crouch, MD      . brimonidine (ALPHAGAN) 0.2 % ophthalmic solution 1 drop  1 drop Left Eye BID Idelle Crouch, MD   1 drop at 01/22/17 2134  . calcium-vitamin D (OSCAL WITH D) 500-200 MG-UNIT per tablet 2 tablet  2 tablet Oral Q breakfast Idelle Crouch, MD   2 tablet at 01/24/17 1324  . ceFEPIme (MAXIPIME) 2 g in dextrose 5 % 50 mL IVPB  2 g Intravenous Q12H Ramond Dial, RPH   2 g at 01/25/17 1755  . Chlorhexidine Gluconate Cloth 2 % PADS 6 each  6 each Topical Q0600 Vaughan Basta, MD   6 each at 01/25/17 0600  . clopidogrel (PLAVIX) tablet 75 mg  75 mg Oral Daily Idelle Crouch, MD   75 mg at 01/24/17 0916  . docusate sodium (COLACE) capsule 100 mg  100 mg Oral BID Idelle Crouch, MD   100  mg at 01/24/17 2153  . famotidine (PEPCID) tablet 20 mg  20 mg Oral BID Idelle Crouch, MD   20 mg at 01/24/17 2153  . furosemide (LASIX) tablet 20 mg  20 mg Oral Daily Idelle Crouch, MD   20 mg at 01/24/17 0916  . gabapentin (NEURONTIN) capsule 300 mg  300 mg Oral TID Idelle Crouch, MD   300 mg at 01/25/17 1717  . heparin injection 5,000 Units  5,000 Units Subcutaneous Q8H Idelle Crouch, MD   5,000 Units at 01/24/17 2155  . insulin aspart (novoLOG) injection 0-15 Units  0-15 Units Subcutaneous TID WC Vaughan Basta, MD      . letrozole [facility] tablet 2.5 mg  2.5 mg Oral Daily Idelle Crouch, MD   2.5 mg at 01/24/17 0916  . linagliptin (TRADJENTA) tablet 5 mg  5 mg Oral Daily Idelle Crouch, MD   5 mg at 01/24/17 0916  . lisinopril (PRINIVIL,ZESTRIL) tablet 10 mg  10 mg Oral Daily Idelle Crouch, MD      .  metFORMIN (GLUCOPHAGE) tablet 1,000 mg  1,000 mg Oral BID WC Idelle Crouch, MD   1,000 mg at 01/25/17 1717  . metroNIDAZOLE (FLAGYL) tablet 500 mg  500 mg Oral Q8H Leonel Ramsay, MD   500 mg at 01/24/17 2153  . morphine 2 MG/ML injection 2 mg  2 mg Intravenous Q2H PRN Idelle Crouch, MD      . mupirocin ointment (BACTROBAN) 2 % 1 application  1 application Nasal BID Vaughan Basta, MD   1 application at 16/60/63 [phone]  . ondansetron (ZOFRAN) tablet 4 mg  4 mg Oral Q6H PRN Idelle Crouch, MD       Or  . ondansetron [facility]) injection 4 mg  4 mg Intravenous Q6H PRN Idelle Crouch, MD      . pravastatin (PRAVACHOL) tablet 40 mg  40 mg Oral Daily Idelle Crouch, MD   40 mg at 01/25/17 1717  . timolol (TIMOPTIC) 0.5 % ophthalmic solution 1 drop  1 drop Left Eye BID Idelle Crouch, MD   1 drop at 01/22/17 2134  . vancomycin (VANCOCIN) IVPB 1000 mg/200 mL premix  1,000 mg Intravenous Q18H Earleen Newport, MD   1,000 mg at 01/25/17 1093    Past Medical History:  Diagnosis Date  . Cancer [facility])    breast  . Carcinoma of right breast ([place]) 05/20/2014  . Diabetes mellitus without complication ([place])   . Hypertension   . Lymphedema   . PONV (postoperative nausea and vomiting)   . Psoriasis 1990  . Stroke ([place]) 2000   residual left arm weakness  . Varicose veins     Past Surgical History:  Procedure Laterality Date  . BREAST EXCISIONAL BIOPSY Right 2015   positive, with radiation  . BREAST LUMPECTOMY Right 04-2014   followed by radiation,  no chemo  . BREAST REDUCTION SURGERY  1977  . CHOLECYSTECTOMY N/A 09/23/2015   Procedure: LAPAROSCOPIC CHOLECYSTECTOMY;  Surgeon: Bonner Puna, MD;  Location: [facility] ORS;  Service: General;  Laterality: N/A;  . DILATION AND CURETTAGE OF UTERUS  2010  . EYE SURGERY Left 2015   cataracts and left eye detached retina repair  . EYE SURGERY Right 2013   cataract with len implant  . GASTRIC ROUX-EN-Y N/A 09/23/2015   Procedure:  LAPAROSCOPIC ROUX-EN-Y GASTRIC;  Surgeon: Bonner Puna, MD;  Location: [facility] ORS;  Service: General;  Laterality: N/A;  .  HIATAL HERNIA REPAIR N/A 09/23/2015   Procedure: LAPAROSCOPIC REPAIR OF HIATAL HERNIA;  Surgeon: Bonner Puna, MD;  Location: [facility] ORS;  Service: General;  Laterality: N/A;  . REDUCTION MAMMAPLASTY      Social History Social History  Substance Use Topics  . Smoking status: Never Smoker  . Smokeless tobacco: Never Used  . Alcohol use No    Family History Family History  Problem Relation Age of Onset  . Diabetes Mother   . Cancer Mother   . Breast cancer Mother 83  . Diabetes Father   . Diabetes Sister   . Diabetes Brother   No family history of bleeding/clotting disorders, porphyria or autoimmune disease   No Known Allergies   REVIEW OF SYSTEMS (Negative unless checked)  Constitutional: [] Weight loss  [] Fever  [] Chills Cardiac: [] Chest pain   [] Chest pressure   [] Palpitations   [] Shortness of breath when laying flat   [] Shortness of breath at rest   [] Shortness of breath with exertion. Vascular:  [] Pain in legs with walking   [] Pain in legs at rest   [] Pain in legs when laying flat   [] Claudication   [x] Pain in feet when walking  [] Pain in feet at rest  [] Pain in feet when laying flat   [] History of DVT   [] Phlebitis   [x] Swelling in legs   [] Varicose veins   [] Non-healing ulcers Pulmonary:   [] Uses home oxygen   [] Productive cough   [] Hemoptysis   [] Wheeze  [] COPD   [] Asthma Neurologic:  [] Dizziness  [] Blackouts   [] Seizures   [] History of stroke   [] History of TIA  [] Aphasia   [] Temporary blindness   [] Dysphagia   [] Weakness or numbness in arms   [] Weakness or numbness in legs Musculoskeletal:  [] Arthritis   [] Joint swelling   [x] Joint pain   [] Low back pain Hematologic:  [] Easy bruising  [] Easy bleeding   [] Hypercoagulable state   [] Anemic  [] Hepatitis Gastrointestinal:  [] Blood in stool   [] Vomiting blood  [] Gastroesophageal reflux/heartburn   [] Difficulty  swallowing. Genitourinary:  [] Chronic kidney disease   [] Difficult urination  [] Frequent urination  [] Burning with urination   [] Blood in urine Skin:  [x] Rashes   [x] Ulcers   [] Wounds Psychological:  [] History of anxiety   []  History of major depression.  Physical Examination  Vitals:   01/25/17 1351 01/25/17 1520 01/25/17 1541 01/25/17 1642  BP: 128/62 (!) 111/42 (!) 110/58 (!) 118/55  Pulse: 64 73 68 72  Resp: (!) 25 (!) 25  17  Temp: 98.7 F (37.1 C) (!) 101 F (38.3 C) (!) 101 F (38.3 C) (!) 100.9 F (38.3 C)  TempSrc:  Oral Oral Oral  SpO2: 95% (!) 84% (!) 89% 90%  Weight:      Height:       Body mass index is 35.43 kg/m. Gen:  WD/WN, NAD Head: [place]/AT, No temporalis wasting. Prominent temp pulse not noted. Ear/Nose/Throat: Hearing grossly intact, nares w/o erythema or drainage, oropharynx w/o Erythema/Exudate Eyes: Sclera non-icteric, conjunctiva clear Neck: Trachea midline.  No JVD.  Pulmonary:  Good air movement, respirations not labored, equal bilaterally.  Cardiac: RRR, normal S1, S2. Vascular: Right foot is bandaged, there is 3+ pitting edema of the calf on the right. There is 1+ pitting edema on the left. There are mild venous changes of the skin on the left. Vessel Right Left  Radial Palpable Palpable  Ulnar Palpable Palpable  Brachial Palpable Palpable  Carotid Palpable, without bruit Palpable, without bruit  Aorta Not palpable N/A  Femoral Palpable Palpable  Popliteal Not Palpable Not Palpable  PT Bandaged  Not Palpable  DP Bandaged  Not Palpable   Gastrointestinal: soft, non-tender/non-distended. No guarding/reflex.  Musculoskeletal: M/S 5/5 throughout.  Extremities without ischemic changes.  No deformity or atrophy. No edema. Neurologic: Sensation grossly intact in extremities.  Symmetrical.  Speech is fluent. Motor exam as listed above. Psychiatric: Judgment intact, Mood & affect appropriate for pt's clinical situation. Dermatologic: Right foot ulcers  noted.  + cellulitis with open wounds. Lymph : No Cervical, Axillary, or Inguinal lymphadenopathy.      CBC Lab Results  Component Value Date   WBC 13.5 (H) 01/23/2017   HGB 9.6 (L) 01/23/2017   HCT 28.1 (L) 01/23/2017   MCV 85.0 01/23/2017   PLT 210 01/23/2017    BMET    Component Value Date/Time   NA 137 01/24/2017 0343   NA 139 11/18/2014 1011   K 4.4 01/24/2017 0343   K 3.7 11/18/2014 1011   CL 103 01/24/2017 0343   CL 100 11/18/2014 1011   CO2 26 01/24/2017 0343   CO2 35 (H) 11/18/2014 1011   GLUCOSE 151 (H) 01/24/2017 0343   GLUCOSE 70 11/18/2014 1011   BUN 20 01/24/2017 0343   BUN 22 (H) 11/18/2014 1011   CREATININE 0.91 01/24/2017 0343   CREATININE 0.88 11/18/2014 1011   CALCIUM 8.4 (L) 01/24/2017 0343   CALCIUM 9.0 11/18/2014 1011   GFRNONAA >60 01/24/2017 0343   GFRNONAA >60 11/18/2014 1011   GFRNONAA >60 01/04/2014 1825   GFRAA >60 01/24/2017 0343   GFRAA >60 11/18/2014 1011   GFRAA >60 01/04/2014 1825   Estimated Creatinine Clearance: 65.9 mL/min (by C-G formula based on SCr of 0.91 mg/dL).  COAG No results found for: INR, PROTIME  Radiology US Venous Img Lower Bilateral  Result Date: 01/22/2017 CLINICAL DATA:  66 year old female with bilateral lower extremity edema EXAM: BILATERAL LOWER EXTREMITY VENOUS DOPPLER ULTRASOUND TECHNIQUE: Gray-scale sonography with graded compression, as well as color Doppler and duplex ultrasound were performed to evaluate the lower extremity deep venous systems from the level of the common femoral vein and including the common femoral, femoral, profunda femoral, popliteal and calf veins including the posterior tibial, peroneal and gastrocnemius veins when visible. The superficial great saphenous vein was also interrogated. Spectral Doppler was utilized to evaluate flow at rest and with distal augmentation maneuvers in the common femoral, femoral and popliteal veins. COMPARISON:  Prior duplex venous ultrasound 01/07/2015  FINDINGS: RIGHT LOWER EXTREMITY Common Femoral Vein: No evidence of thrombus. Normal compressibility, respiratory phasicity and response to augmentation. Saphenofemoral Junction: No evidence of thrombus. Normal compressibility and flow on color Doppler imaging. Profunda Femoral Vein: No evidence of thrombus. Normal compressibility and flow on color Doppler imaging. Femoral Vein: No evidence of thrombus. Normal compressibility, respiratory phasicity and response to augmentation. Popliteal Vein: No evidence of thrombus. Normal compressibility, respiratory phasicity and response to augmentation. Calf Veins: Limited visualization of the peroneal veins. No evidence of thrombus in the posterior tibial veins. Superficial Great Saphenous Vein: No evidence of thrombus. Normal compressibility and flow on color Doppler imaging. Venous Reflux:  None. Other Findings:  None. LEFT LOWER EXTREMITY Common Femoral Vein: No evidence of thrombus. Normal compressibility, respiratory phasicity and response to augmentation. Saphenofemoral Junction: No evidence of thrombus. Normal compressibility and flow on color Doppler imaging. Profunda Femoral Vein: No evidence of thrombus. Normal compressibility and flow on color Doppler imaging. Femoral Vein: No evidence of thrombus. Normal compressibility, respiratory phasicity and response to  augmentation. Popliteal Vein: No evidence of thrombus. Normal compressibility, respiratory phasicity and response to augmentation. Calf Veins: Limited visualization of the peroneal veins. No evidence of thrombus in the posterior tibial veins. Superficial Great Saphenous Vein: No evidence of thrombus. Normal compressibility and flow on color Doppler imaging. Venous Reflux:  None. Other Findings: Larger reactive lymph nodes in the superficial right inguinal nodal station. IMPRESSION: 1. No evidence of deep venous thrombosis. 2. Reactive lymph nodes in the right superficial inguinal station. Electronically Signed    By: Jacqulynn Cadet M.D.   On: 01/22/2017 13:45   Dg Chest Port 1 View  Result Date: 01/22/2017 CLINICAL DATA:  Cellulitis of right lower leg.  Fever. EXAM: PORTABLE CHEST 1 VIEW COMPARISON:  April 15, 2015 FINDINGS: Stable cardiomegaly. The hila and mediastinum are normal. No pneumothorax. No pulmonary nodules or masses. No focal infiltrates or overt edema. IMPRESSION: No active disease. Electronically Signed   By: Dorise Bullion III M.D   On: 01/22/2017 11:45   Dg Foot 2 Views Right  Result Date: 01/24/2017 CLINICAL DATA:  Diabetic foot infection EXAM: RIGHT FOOT - 2 VIEW COMPARISON:  None. FINDINGS: Soft tissue swelling and extensive gas dorsal to the fifth metatarsal phalangeal joint. This obscures bony detail. Cannot exclude osteomyelitis of the fifth proximal phalanx and/or distal fifth metatarsal. No other areas of osteomyelitis Degenerative change in the first MTP. Diffuse arterial calcification. Calcaneal spurring. IMPRESSION: Soft tissue swelling and gas surround the fifth metatarsal phalangeal joint. Probable underlying osteomyelitis not well visualized due to overlying gas. Electronically Signed   By: Franchot Gallo M.D.   On: 01/24/2017 14:18      Assessment/Plan 1. Atherosclerotic occlusive disease bilateral lower extremities with open wound right foot:  Patient is at high risk for limb loss given her severe diabetic foot infection. Popliteal pulses not palpable pedal pulses on the left are nonpalpable suggesting severe atherosclerotic occlusive disease. Given these 2 findings angiography with the hope for intervention is recommended. The risks and benefits of been reviewed with the patient all questions are answered and the patient has agreed to proceed with arteriogram and possible intervention for limb salvage. 2. Diabetic foot abscess: Dr. Elvina Mattes debridement debrided this abscess today patient is also being seen by infectious disease for proper antibiotic coverage wound care will  continue. 3. Diabetes mellitus: Patient's blood sugars will be monitored she will be maintained on a sliding scale 4. Lymphedema: Once her infection has been adequately treated and her wound is healing she can be assessed for lymph pump which I believe will be very helpful for her long-term. 5. Hypertension: Patient's vital signs will be monitored and her home antihypertensives will be continued.   Hortencia Pilar, MD  01/25/2017 6:37 PM    This note was created with Dragon medical transcription system.  Any error is purely unintentional

## 2017-01-25 NOTE — Anesthesia Post-op Follow-up Note (Cosign Needed)
Anesthesia QCDR form completed.        

## 2017-01-25 NOTE — Progress Notes (Signed)
[place] at [place] NAME: Elizabeth [name]    MR#:  [id number]  DATE OF BIRTH:  01/09/1951  Came in with redness of right lower extremity with swelling more than usual fever and found to have elevated white count  still running fever, have a dark ulcer and swelling on base of 5th metatarsal and toe.  Podiatry saw the pt and did some debridement- suggested amputation.  REVIEW OF SYSTEMS:   Review of Systems  Constitutional: Negative for chills, fever and weight loss.  HENT: Negative for ear discharge, ear pain and nosebleeds.   Eyes: Negative for blurred vision, pain and discharge.  Respiratory: Negative for sputum production, shortness of breath, wheezing and stridor.   Cardiovascular: Positive for leg swelling. Negative for chest pain, palpitations, orthopnea and PND.  Gastrointestinal: Negative for abdominal pain, diarrhea, nausea and vomiting.  Genitourinary: Negative for frequency and urgency.  Musculoskeletal: Negative for back pain and joint pain.  Skin: Positive for rash.  Neurological: Negative for sensory change, speech change, focal weakness and weakness.  Psychiatric/Behavioral: Negative for depression and hallucinations. The patient is not nervous/anxious.    Tolerating Diet:yes Tolerating PT: pending  DRUG ALLERGIES:  No Known Allergies  VITALS:  Blood pressure (!) 118/54, pulse 71, temperature 99.9 F (37.7 C), temperature source Oral, resp. rate 18, height 5\' 3"  (1.6 m), weight 90.7 kg (200 lb), SpO2 90 %.  PHYSICAL EXAMINATION:   Physical Exam  GENERAL:  66 y.o.-year-old patient lying in the bed with no acute distress.  EYES: Pupils equal, round, reactive to light and accommodation. No scleral icterus. Extraocular muscles intact.  HEENT: Head atraumatic, normocephalic. Oropharynx and nasopharynx clear.  NECK:  Supple, no jugular venous distention. No thyroid enlargement, no tenderness.  LUNGS: Normal breath  sounds bilaterally, no wheezing, rales, rhonchi. No use of accessory muscles of respiration.  CARDIOVASCULAR: S1, S2 normal. No murmurs, rubs, or gallops.  ABDOMEN: Soft, nontender, nondistended. Bowel sounds present. No organomegaly or mass.  EXTREMITIES: No cyanosis, clubbing    Right lower extremity edema swelling and cellulitis at the right ankle and foot. Marking done with black sharpie    She has black ulcer at the base of fifth toe with surrounding swelling on foot. NEUROLOGIC: Cranial nerves II through XII are intact. No focal Motor or sensory deficits b/l.   PSYCHIATRIC:  patient is alert and oriented x 3.  SKIN: No obvious rash, lesion, or ulcer.   LABORATORY PANEL:  CBC  Recent Labs Lab 01/23/17 0338  WBC 13.5*  HGB 9.6*  HCT 28.1*  PLT 210    Chemistries   Recent Labs Lab 01/23/17 0338 01/24/17 0343  NA 135 137  K 3.4* 4.4  CL 102 103  CO2 27 26  GLUCOSE 190* 151*  BUN 18 20  CREATININE 0.98 0.91  CALCIUM 7.7* 8.4*  AST 32  --   ALT 22  --   ALKPHOS 70  --   BILITOT 0.6  --    Cardiac Enzymes  Recent Labs Lab 01/22/17 1146  TROPONINI 0.03*   RADIOLOGY:  Dg Foot 2 Views Right  Result Date: 01/24/2017 CLINICAL DATA:  Diabetic foot infection EXAM: RIGHT FOOT - 2 VIEW COMPARISON:  None. FINDINGS: Soft tissue swelling and extensive gas dorsal to the fifth metatarsal phalangeal joint. This obscures bony detail. Cannot exclude osteomyelitis of the fifth proximal phalanx and/or distal fifth metatarsal. No other areas of osteomyelitis Degenerative change in the first MTP. Diffuse arterial calcification.  Calcaneal spurring. IMPRESSION: Soft tissue swelling and gas surround the fifth metatarsal phalangeal joint. Probable underlying osteomyelitis not well visualized due to overlying gas. Electronically Signed   By: Franchot Gallo M.D.   On: 01/24/2017 14:18   ASSESSMENT AND PLAN:   Elizabeth [name] is a 66 y.o. female has a past medical history significant for  breast cancer, HTN, DM ,and lymphedema now with progressive pain, swelling, and redness of RLE with fever despite po Doxycycline. In ER, WBC elevated and pt was febrile.  1. Right lower extremity cellulitis with swelling/edema- osteomyelitis. Peripheral arterial disease. -Continue vanc and  Zosyn. Once blood cultures are available deescalate Antibiotics -When necessary pain meds -Keep leg elevated -Lower extremity ultrasound negative for DVT - Called podiatry consult. - did debridement, and planned for amputation, but involved vascular due to poor circulation.  2. Diabetes continue home meds and sliding scale  3. Chronic lymphedema   Vascular is suggesting to evaluate for lymph pump once infection is resolved.  4.. Hypertension continue home meds  5. DVT prophylaxis Lovenox Case discussed with Care Management/Social Worker. Management plans discussed with the patient, family and they are in agreement.  CODE STATUS: Full code  DVT Prophylaxis: Lovenox TOTAL TIME TAKING CARE OF THIS PATIENT: 35 minutes.  >50% time spent on counselling and coordination of care  POSSIBLE D/C IN 2-3 DAYS, DEPENDING ON CLINICAL CONDITION.  Note: This dictation was prepared with Dragon dictation along with smaller phrase technology. Any transcriptional errors that result from this process are unintentional.  Vaughan Basta M.D on 01/25/2017 at 8:57 PM  Between 7am to 6pm - Pager - [phone]  After 6pm go to [email or web address] - password [email or web address] [facility]  Office  [phone]  CC: Primary care physician; Marguerita Merles, MD

## 2017-01-25 NOTE — Op Note (Signed)
Operative note   Surgeon: Dr. Albertine Patricia, DPM.    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    Postop diagnosis: Same    Procedure:   1. Fifth ray amputation right foot with debridement of deep necrotic gangrenous tissue.          EBL: 35 cc    Anesthesia:general    Hemostasis: None     Specimen: Bone culture from the fifth metatarsal head was sent for aerobic and anaerobic evaluation. Pathology specimens of the fifth toe amputation site and necrotic bone and soft tissue was also sent.    Complications: None    Operative indications: Severe infection with gas in tissues to the right fifth metatarsal phalangeal joint.    Procedure:  Patient was brought into the OR and placed on the operating table in thesupine position. After anesthesia was obtained theright lower extremity was prepped and draped in usual sterile fashion.  Operative Report: At This time attention was directed to the fifth metatarsal where crepitus tissue was palpated over the fifth metatarsal distal and mid shaft regions. A linear incision made through this area allowed significant purulent drainage . The incision was carried down to deep soft tissue, bone and capsule and extended all way out to the base of the toe. Significant necrotic tissue was encountered in the region as well as the heavy drainage. With further inspection it was seen that the proximal phalanx base and the fifth metatarsal head were necrotic and gangrenous tissue was starting to spread over to the fourth metatarsal phalangeal joints and capsular tissues well. An ulcer was noted plantarly on the foot as well but had some necrotic tissue associated with it. Utilizing a sharp dissection as well as versa jet the ulcer was cleaned out. The wound probed all way through from plantar to dorsal. Small wound was dorsal where I opened up last  night and allowed pus draining from the region. This time because of extensive damage to the fifth metatarsal head and proximal phalanx and also the progression of the infection medially at the fourth metatarsal head and phalangeal joint I elected to go ahead and do a fifth ray amputation the toe was removed and the metatarsal was cut at about the proximal third and removed. Good blood flow and healthy tissue was noted proximally. At This point a versa jet was used to aid in cleaned up all necrotic infected soft tissue from the fifth metatarsal region as well as to the lateral side of the fourth metatarsal head and phalangeal joint. Also plantar to the fourth metatarsal head some necrotic gangrenous tissue was noted this was removed also with a versa jet until it appeared to be completely debrided with healthier tissue. Once all necrosis was removed the area was copiously irrigated and the proximal portion of the wound was closed with 3-0 nylon simple ruptured sutures. Distally I packed the area open with 4 x 4 gauze packing. Patient is scheduled to have a vascular consult and hopefully may maybe a little improved her distal circulation with an angioplasty. If the infection stays stable and there is no progression of infection to the fourth metatarsal region hopefully I can close this up later this week. If the infection progresses to the metatarsal head and necrosis develops and fourth metatarsal and the fourth toe that area will likely have to be amputated as well. Primarily I needed to get the  infection under better control and we need to see how much improvement she can get with a vascular evaluation. After the wound was packed the area was wrapped with Kerlix and an Ace wrap she is to remain nonweightbearing on it. He follow her in the Hospital.    Patient tolerated the procedure and anesthesia well.  Was transported from the OR to the PACU with all vital signs stable and vascular status intact.

## 2017-01-25 NOTE — Anesthesia Preprocedure Evaluation (Signed)
Anesthesia Evaluation  Patient identified by MRN, date of birth, ID band Patient awake    Reviewed: Allergy & Precautions, NPO status , Patient's Chart, lab work & pertinent test results  History of Anesthesia Complications (+) PONV and history of anesthetic complications  Airway Mallampati: II  TM Distance: >3 FB Neck ROM: Full    Dental  (+) Poor Dentition   Pulmonary asthma , neg sleep apnea,    breath sounds clear to auscultation- rhonchi (-) wheezing      Cardiovascular hypertension, Pt. on medications + Peripheral Vascular Disease  (-) CAD and (-) Past MI  Rhythm:Regular Rate:Normal - Systolic murmurs and - Diastolic murmurs    Neuro/Psych PSYCHIATRIC DISORDERS Depression CVA (R sided weakness), Residual Symptoms    GI/Hepatic Neg liver ROS, GERD  ,  Endo/Other  diabetes, Oral Hypoglycemic Agents  Renal/GU negative Renal ROS     Musculoskeletal negative musculoskeletal ROS (+)   Abdominal (+) + obese,   Peds  Hematology negative hematology ROS (+)   Anesthesia Other Findings Past Medical History: No date: Cancer [facility])     Comment: breast 05/20/2014: Carcinoma of right breast ([place]) No date: Diabetes mellitus without complication (HCC) No date: Hypertension No date: Lymphedema No date: PONV (postoperative nausea and vomiting) 1990: Psoriasis 2000: Stroke ([place])     Comment: residual left arm weakness No date: Varicose veins   Reproductive/Obstetrics                             Anesthesia Physical Anesthesia Plan  ASA: III  Anesthesia Plan: General   Post-op Pain Management:    Induction: Intravenous  Airway Management Planned: Oral ETT  Additional Equipment:   Intra-op Plan:   Post-operative Plan: Extubation in OR  Informed Consent: I have reviewed the patients History and Physical, chart, labs and discussed the procedure including the risks, benefits and  alternatives for the proposed anesthesia with the patient or authorized representative who has indicated his/her understanding and acceptance.   Dental advisory given  Plan Discussed with: CRNA and Anesthesiologist  Anesthesia Plan Comments:         Anesthesia Quick Evaluation

## 2017-01-25 NOTE — Anesthesia Postprocedure Evaluation (Signed)
Anesthesia Post Note  Patient: Elizabeth [name]  Procedure(s) Performed: Procedure(s) (LRB): IRRIGATION AND DEBRIDEMENT FOOT (Right) AMPUTATION TOE    (Right)  Patient location during evaluation: PACU Anesthesia Type: General Level of consciousness: awake and alert and oriented Pain management: pain level controlled Vital Signs Assessment: post-procedure vital signs reviewed and stable Respiratory status: spontaneous breathing, nonlabored ventilation and respiratory function stable Cardiovascular status: blood pressure returned to baseline and stable Postop Assessment: no signs of nausea or vomiting Anesthetic complications: no     Last Vitals:  Vitals:   01/25/17 1328 01/25/17 1336  BP:  118/60  Pulse: 67 66  Resp: (!) 24 (!) 25  Temp: 37 C     Last Pain:  Vitals:   01/25/17 1153  TempSrc: Tympanic  PainSc: 5                  Lanessa Shill

## 2017-01-25 NOTE — Progress Notes (Signed)
[facility] INFECTIOUS DISEASE PROGRESS NOTE Date of Admission:  01/22/2017     ID: Elizabeth [name] is a 66 y.o. female with necrotizing foot infection  Principal Problem:   Cellulitis Active Problems:   Diabetes mellitus, type 2 ([place])   Morbid obesity ([place])   Hypokalemia   Subjective: Febrile to 102, went to OR 3/20 with fifth ray amputation and debridement of deep necrotic tissue  ROS  Eleven systems are reviewed and negative except per hpi  Medications:  Antibiotics Given (last 72 hours)    Date/Time Action Medication Dose Rate   01/22/17 1736 Given   piperacillin-tazobactam (ZOSYN) IVPB 3.375 g 3.375 g 12.5 mL/hr   01/22/17 2133 Given   vancomycin (VANCOCIN) IVPB 1000 mg/200 mL premix 1,000 mg 200 mL/hr   01/23/17 0902 Given   piperacillin-tazobactam (ZOSYN) IVPB 3.375 g 3.375 g 12.5 mL/hr   01/23/17 1857 Given   piperacillin-tazobactam (ZOSYN) IVPB 3.375 g 3.375 g 12.5 mL/hr   01/24/17 0249 Given   piperacillin-tazobactam (ZOSYN) IVPB 3.375 g 3.375 g 12.5 mL/hr   01/24/17 0745 Given   vancomycin (VANCOCIN) IVPB 1000 mg/200 mL premix 1,000 mg 200 mL/hr   01/24/17 0916 Given   piperacillin-tazobactam (ZOSYN) IVPB 3.375 g 3.375 g 12.5 mL/hr   01/24/17 1751 Given   metroNIDAZOLE (FLAGYL) tablet 500 mg 500 mg    01/24/17 2153 Given   metroNIDAZOLE (FLAGYL) tablet 500 mg 500 mg    01/25/17 0218 Given   vancomycin (VANCOCIN) IVPB 1000 mg/200 mL premix 1,000 mg 200 mL/hr   01/25/17 0532 Given   ceFEPIme (MAXIPIME) 2 g in dextrose 5 % 50 mL IVPB 2 g 100 mL/hr     . aspirin  81 mg Oral Daily  . atenolol  50 mg Oral Daily  . brimonidine  1 drop Left Eye BID  . calcium-vitamin D  2 tablet Oral Q breakfast  . ceFEPIme (MAXIPIME) 2 GM IVP  2 g Intravenous Q12H  . Chlorhexidine Gluconate Cloth  6 each Topical Q0600  . clopidogrel  75 mg Oral Daily  . docusate sodium  100 mg Oral BID  . famotidine  20 mg Oral BID  . furosemide  20 mg Oral Daily  . gabapentin  300 mg  Oral TID  . heparin  5,000 Units Subcutaneous Q8H  . insulin aspart  0-15 Units Subcutaneous TID WC  . letrozole  2.5 mg Oral Daily  . linagliptin  5 mg Oral Daily  . lisinopril  10 mg Oral Daily  . metFORMIN  1,000 mg Oral BID WC  . metroNIDAZOLE  500 mg Oral Q8H  . mupirocin ointment  1 application Nasal BID  . pravastatin  40 mg Oral Daily  . timolol  1 drop Left Eye BID  . vancomycin  1,000 mg Intravenous Q18H    Objective: Vital signs in last 24 hours: Temp:  [97.4 F (36.3 C)-103.1 F (39.5 C)] 98.7 F (37.1 C) (03/20 1351) Pulse Rate:  [62-77] 64 (03/20 1351) Resp:  [13-25] 25 (03/20 1351) BP: (93-137)/(52-62) 128/62 (03/20 1351) SpO2:  [91 %-100 %] 95 % (03/20 1351) Weight:  [90.7 kg (200 lb)] 90.7 kg (200 lb) (03/20 1153) Constitutional:  Chronically ill apperaing HENT: [place]/AT, PERRLA, no scleral icterus Mouth/Throat: Oropharynx is clear and moist. No oropharyngeal exudate.  Cardiovascular: Normal rate, regular rhythm and normal heart sounds. Pulmonary/Chest: Effort normal and breath sounds normal. No respiratory distress.  has no wheezes.  Neck = supple, no nuchal rigidity Abdominal: Soft. Bowel sounds are  normal.  exhibits no distension. There is no tenderness.  Lymphadenopathy: no cervical adenopathy. No axillary adenopathy Neurological: alert and oriented to person, place, and time.  Ext RLE with 2+ edema Skin:  L foot wrapped post op  Psychiatric: a normal mood and affect.  behavior is normal.   Lab Results  Recent Labs  01/23/17 0338 01/24/17 0343  WBC 13.5*  --   HGB 9.6*  --   HCT 28.1*  --   NA 135 137  K 3.4* 4.4  CL 102 103  CO2 27 26  BUN 18 20  CREATININE 0.98 0.91   Lab Results  Component Value Date   ESRSEDRATE 126 (H) 01/24/2017    Microbiology: Results for orders placed or performed during the hospital encounter of 01/22/17  Blood Culture (routine x 2)     Status: None (Preliminary result)   Collection Time: 01/22/17 11:46 AM   Result Value Ref Range Status   Specimen Description BLOOD LEFT ASSIST CONTROL  Final   Special Requests BOTTLES DRAWN AEROBIC AND ANAEROBIC BCAV  Final   Culture NO GROWTH 3 DAYS  Final   Report Status PENDING  Incomplete  Blood Culture (routine x 2)     Status: None (Preliminary result)   Collection Time: 01/22/17 11:46 AM  Result Value Ref Range Status   Specimen Description BLOOD RIGHT ASSIST CONTROL  Final   Special Requests BOTTLES DRAWN AEROBIC AND ANAEROBIC BCAV  Final   Culture NO GROWTH 3 DAYS  Final   Report Status PENDING  Incomplete  MRSA PCR Screening     Status: Abnormal   Collection Time: 01/22/17  4:20 PM  Result Value Ref Range Status   MRSA by PCR POSITIVE (A) NEGATIVE Final    Comment:        The GeneXpert MRSA Assay (FDA approved for NASAL specimens only), is one component of a comprehensive MRSA colonization surveillance program. It is not intended to diagnose MRSA infection nor to guide or monitor treatment for MRSA infections. RESULT CALLED TO, READ BACK BY AND VERIFIED WITH: Talbot Grumbling @ [id number] 01/22/17 by Annie Jeffrey [facility]   Urine culture     Status: None   Collection Time: 01/22/17  9:24 PM  Result Value Ref Range Status   Specimen Description URINE, RANDOM  Final   Special Requests NONE  Final   Culture   Final    NO GROWTH Performed at [facility], [address], [place], [place] [address]    Report Status 01/25/2017 FINAL  Final  Aerobic/Anaerobic Culture (surgical/deep wound)     Status: None (Preliminary result)   Collection Time: 01/24/17  5:00 PM  Result Value Ref Range Status   Specimen Description FOOT  Final   Special Requests RIGHT FOOT NO ANAEROBIC SWAB SUBMITTED  Final   Gram Stain   Final    FEW WBC PRESENT, PREDOMINANTLY PMN MODERATE GRAM POSITIVE COCCI IN PAIRS FEW GRAM POSITIVE COCCI IN CLUSTERS RARE GRAM NEGATIVE RODS RARE GRAM POSITIVE RODS    Culture   Final    CULTURE REINCUBATED FOR BETTER GROWTH Performed at [facility], [place] [address] [place], [place] [address]    Report Status PENDING  Incomplete  C difficile quick scan w PCR reflex     Status: None   Collection Time: 01/25/17 12:00 AM  Result Value Ref Range Status   C Diff antigen NEGATIVE NEGATIVE Final   C Diff toxin NEGATIVE NEGATIVE Final   C Diff interpretation  No C. difficile detected.  Final    Studies/Results: Dg Foot 2 Views Right  Result Date: 01/24/2017 CLINICAL DATA:  Diabetic foot infection EXAM: RIGHT FOOT - 2 VIEW COMPARISON:  None. FINDINGS: Soft tissue swelling and extensive gas dorsal to the fifth metatarsal phalangeal joint. This obscures bony detail. Cannot exclude osteomyelitis of the fifth proximal phalanx and/or distal fifth metatarsal. No other areas of osteomyelitis Degenerative change in the first MTP. Diffuse arterial calcification. Calcaneal spurring. IMPRESSION: Soft tissue swelling and gas surround the fifth metatarsal phalangeal joint. Probable underlying osteomyelitis not well visualized due to overlying gas. Electronically Signed   By: Franchot Gallo M.D.   On: 01/24/2017 14:18    Assessment/Plan: Elizabeth [name] is a 66 y.o. female with DM, B CA admitted with celluliits, necrotizing infection and osteomyelitis of R foot . Dr Elvina Mattes performed on 3/20 fifth ray amputation and debridement of deep necrotic tissue, concern also for 4th ray involvment. ESR is 126, crp 28, cx pending.  Recommendations Cont vanco cefepime and flagyl For vascular intervention Will need likely 4-6 weeks IV therapy - picc discussed with pt and will order Will base final abx on culture results Thank you very much for the consult. Will follow with you.  Seiya Silsby P   01/25/2017, 2:41 PM

## 2017-01-25 NOTE — Transfer of Care (Signed)
Immediate Anesthesia Transfer of Care Note  Patient: Elizabeth [name]  Procedure(s) Performed: Procedure(s): IRRIGATION AND DEBRIDEMENT FOOT (Right) AMPUTATION TOE    (Right)  Patient Location: PACU  Anesthesia Type:General  Level of Consciousness: sedated  Airway & Oxygen Therapy: Patient Spontanous Breathing and Patient connected to face mask oxygen  Post-op Assessment: Report given to RN and Post -op Vital signs reviewed and stable  Post vital signs: Reviewed and stable  Last Vitals:  Vitals:   01/25/17 1153 01/25/17 1307  BP: 137/62 (!) 93/52  Pulse: 65 63  Resp: 16 13  Temp: (!) 39.1 C     Last Pain:  Vitals:   01/25/17 1153  TempSrc: Tympanic  PainSc: 5       Patients Stated Pain Goal: 0 (59/13/68 [id number])  Complications: No apparent anesthesia complications

## 2017-01-25 NOTE — Plan of Care (Signed)
Patient ready to going to OR.  Report given to Orvis Brill, RN. CHG X2 complete. Consents signed x2. RN assessment and vitals reveal stability.  Teds in place. Beta blocker given with small sip of water. All jewelry and dentures removed. Patient has been NPO since midnight.

## 2017-01-26 ENCOUNTER — Encounter: Payer: Self-pay | Admitting: Podiatry

## 2017-01-26 DIAGNOSIS — I70238 Atherosclerosis of native arteries of right leg with ulceration of other part of lower right leg: Secondary | ICD-10-CM

## 2017-01-26 LAB — BASIC METABOLIC PANEL
ANION GAP: 6 (ref 5–15)
BUN: 18 mg/dL (ref 6–20)
CHLORIDE: 104 mmol/L (ref 101–111)
CO2: 24 mmol/L (ref 22–32)
Calcium: 7.9 mg/dL — ABNORMAL LOW (ref 8.9–10.3)
Creatinine, Ser: 0.83 mg/dL (ref 0.44–1.00)
GFR calc non Af Amer: >60 mL/min (ref >60)
GLUCOSE: 113 mg/dL — AB (ref 65–99)
POTASSIUM: 3.5 mmol/L (ref 3.5–5.1)
Sodium: 134 mmol/L — ABNORMAL LOW (ref 135–145)

## 2017-01-26 LAB — GLUCOSE, CAPILLARY
GLUCOSE-CAPILLARY: 143 mg/dL — AB (ref 65–99)
Glucose-Capillary: 112 mg/dL — ABNORMAL HIGH (ref 65–99)
Glucose-Capillary: 154 mg/dL — ABNORMAL HIGH (ref 65–99)
Glucose-Capillary: 92 mg/dL (ref 65–99)

## 2017-01-26 LAB — CBC
HEMATOCRIT: 24.3 % — AB (ref 35.0–47.0)
HEMOGLOBIN: 8.3 g/dL — AB (ref 12.0–16.0)
MCH: 28.5 pg (ref 26.0–34.0)
MCHC: 34 g/dL (ref 32.0–36.0)
MCV: 83.8 fL (ref 80.0–100.0)
Platelets: 307 10*3/uL (ref 150–440)
RBC: 2.9 MIL/uL — ABNORMAL LOW (ref 3.80–5.20)
RDW: 12.8 % (ref 11.5–14.5)
WBC: 12.8 10*3/uL — AB (ref 3.6–11.0)

## 2017-01-26 MED ORDER — SODIUM CHLORIDE 0.9 % IV SOLN: INTRAVENOUS | Status: DC

## 2017-01-26 NOTE — Progress Notes (Signed)
Patient Demographics  Elizabeth [name], is a 66 y.o. female   MRN: [id number]   DOB - 1951/09/19  Admit Date - 01/22/2017    Outpatient Primary MD for the patient is Marguerita Merles, MD  Consult requested in the Hospital by Fritzi Mandes, MD, On 01/26/2017   With History of -  Past Medical History:  Diagnosis Date  . Cancer [facility])    breast  . Carcinoma of right breast ([place]) 05/20/2014  . Diabetes mellitus without complication ([place])   . Hypertension   . Lymphedema   . PONV (postoperative nausea and vomiting)   . Psoriasis 1990  . Stroke ([place]) 2000   residual left arm weakness  . Varicose veins       Past Surgical History:  Procedure Laterality Date  . AMPUTATION TOE Right 01/25/2017   Procedure: AMPUTATION TOE   ;  Surgeon: Albertine Patricia, DPM;  Location: [facility] ORS;  Service: Podiatry;  Laterality: Right;  . BREAST EXCISIONAL BIOPSY Right 2015   positive, with radiation  . BREAST LUMPECTOMY Right 04-2014   followed by radiation,  no chemo  . BREAST REDUCTION SURGERY  1977  . CHOLECYSTECTOMY N/A 09/23/2015   Procedure: LAPAROSCOPIC CHOLECYSTECTOMY;  Surgeon: Bonner Puna, MD;  Location: [facility] ORS;  Service: General;  Laterality: N/A;  . DILATION AND CURETTAGE OF UTERUS  2010  . EYE SURGERY Left 2015   cataracts and left eye detached retina repair  . EYE SURGERY Right 2013   cataract with len implant  . GASTRIC ROUX-EN-Y N/A 09/23/2015   Procedure: LAPAROSCOPIC ROUX-EN-Y GASTRIC;  Surgeon: Bonner Puna, MD;  Location: [facility] ORS;  Service: General;  Laterality: N/A;  . HIATAL HERNIA REPAIR N/A 09/23/2015   Procedure: LAPAROSCOPIC REPAIR OF HIATAL HERNIA;  Surgeon: Bonner Puna, MD;  Location: [facility] ORS;  Service: General;  Laterality: N/A;  . IRRIGATION AND DEBRIDEMENT FOOT Right 01/25/2017   Procedure: IRRIGATION  AND DEBRIDEMENT FOOT;  Surgeon: Albertine Patricia, DPM;  Location: [facility] ORS;  Service: Podiatry;  Laterality: Right;  . REDUCTION MAMMAPLASTY      in for   Chief Complaint  Patient presents with  . Cellulitis     HPI  Elizabeth [name]  is a 66 y.o. female, Admitted on Saturday, March 17. I was consulted on Monday, March 19. Patient was seen to have cellulitis very severely infected right foot. X-ray showed gas in the tissues and likely osteomyelitis to the fifth metatarsophalangeal joint metatarsal as well as proximal phalanx. I drained at bedside on Monday evening and schedule the patient for surgery the next day to compensate yesterday with a fifth ray amputation. Extensive purulence was and versa jet was used to clean and remove as much infected necrotic tissue as possible.     Social History Social History  Substance Use Topics  . Smoking status: Never Smoker  . Smokeless tobacco: Never Used  . Alcohol use No  Family History Family History  Problem Relation Age of Onset  . Diabetes Mother   . Cancer Mother   . Breast cancer Mother 21  . Diabetes Father   . Diabetes Sister   . Diabetes Brother     Prior to  Admission medications   Medication Sig Start Date End Date Taking? Authorizing Provider  albuterol (PROVENTIL HFA;VENTOLIN HFA) 108 (90 BASE) MCG/ACT inhaler Inhale 2 puffs into the lungs every 6 (six) hours as needed for wheezing or shortness of breath.   Yes Historical Provider, MD  calcium-vitamin D (OSCAL WITH D) 500-200 MG-UNIT tablet Take 2 tablets by mouth daily with breakfast. 11/23/16  Yes Lloyd Huger, MD  clopidogrel (PLAVIX) 75 MG tablet Take 75 mg by mouth. 11/06/15  Yes Historical Provider, MD  furosemide (LASIX) 20 MG tablet Take 20 mg by mouth daily.   Yes Historical Provider, MD  gabapentin (NEURONTIN) 300 MG capsule Take 300 mg by mouth 3 (three) times daily.   Yes Historical Provider, MD  letrozole (FEMARA) 2.5 MG tablet Take 1 tablet (2.5 mg total)  by mouth daily. 11/23/16  Yes Lloyd Huger, MD  linagliptin (TRADJENTA) 5 MG TABS tablet Take 5 mg by mouth daily.   Yes Historical Provider, MD  lisinopril (PRINIVIL,ZESTRIL) 10 MG tablet Take 10 mg by mouth daily.   Yes Historical Provider, MD  metFORMIN (GLUCOPHAGE) 1000 MG tablet Take 1,000 mg by mouth 2 (two) times daily with a meal.   Yes Historical Provider, MD  pravastatin (PRAVACHOL) 40 MG tablet Take 40 mg by mouth daily. In afternoon   Yes Historical Provider, MD  aspirin 81 MG tablet Take 81 mg by mouth daily. Reported on 05/25/2016    Historical Provider, MD  atenolol (TENORMIN) 50 MG tablet Take 50 mg by mouth daily. Reported on 05/25/2016    Historical Provider, MD  brimonidine (ALPHAGAN) 0.2 % ophthalmic solution Place 1 drop into the left eye 2 (two) times daily. Reported on 05/25/2016    Historical Provider, MD  insulin lispro (HUMALOG) 100 UNIT/ML injection Inject into the skin 3 (three) times daily before meals. Reported on 05/25/2016    Historical Provider, MD  ranitidine (ZANTAC) 150 MG tablet Take 150 mg by mouth 2 (two) times daily. Reported on 05/25/2016    Historical Provider, MD  timolol (BETIMOL) 0.5 % ophthalmic solution Place 1 drop into the left eye 2 (two) times daily. Reported on 05/25/2016    Historical Provider, MD    Anti-infectives    Start     Dose/Rate Route Frequency Ordered Stop   01/26/17 0800  vancomycin (VANCOCIN) IVPB 1000 mg/200 mL premix     1,000 mg 200 mL/hr over 60 Minutes Intravenous Every 12 hours 01/25/17 2039     01/24/17 1800  ceFEPIme (MAXIPIME) 2 g in dextrose 5 % 50 mL IVPB     2 g 100 mL/hr over 30 Minutes Intravenous Every 12 hours 01/24/17 1747     01/24/17 1730  metroNIDAZOLE (FLAGYL) tablet 500 mg     500 mg Oral Every 8 hours 01/24/17 1713     01/22/17 2000  vancomycin (VANCOCIN) IVPB 1000 mg/200 mL premix  Status:  Discontinued     1,000 mg 200 mL/hr over 60 Minutes Intravenous Every 18 hours 01/22/17 1650 01/25/17 2039    01/22/17 1800  piperacillin-tazobactam (ZOSYN) IVPB 3.375 g  Status:  Discontinued     3.375 g 12.5 mL/hr over 240 Minutes Intravenous Every 8 hours 01/22/17 1650 01/24/17 1713   01/22/17 1130  piperacillin-tazobactam (ZOSYN) IVPB 3.375 g     3.375 g 100 mL/hr over 30 Minutes Intravenous  Once 01/22/17 1123 01/22/17 1235   01/22/17 1130  vancomycin (VANCOCIN) IVPB 1000 mg/200 mL premix     1,000 mg 200 mL/hr  over 60 Minutes Intravenous  Once 01/22/17 1123 01/22/17 1357      Scheduled Meds: . aspirin  81 mg Oral Daily  . atenolol  50 mg Oral Daily  . brimonidine  1 drop Left Eye BID  . calcium-vitamin D  2 tablet Oral Q breakfast  . ceFEPIme (MAXIPIME) 2 GM IVP  2 g Intravenous Q12H  . Chlorhexidine Gluconate Cloth  6 each Topical Q0600  . clopidogrel  75 mg Oral Daily  . docusate sodium  100 mg Oral BID  . famotidine  20 mg Oral BID  . furosemide  20 mg Oral Daily  . gabapentin  300 mg Oral TID  . heparin  5,000 Units Subcutaneous Q8H  . insulin aspart  0-15 Units Subcutaneous TID WC  . letrozole  2.5 mg Oral Daily  . linagliptin  5 mg Oral Daily  . lisinopril  10 mg Oral Daily  . metFORMIN  1,000 mg Oral BID WC  . metroNIDAZOLE  500 mg Oral Q8H  . mupirocin ointment  1 application Nasal BID  . pravastatin  40 mg Oral Daily  . timolol  1 drop Left Eye BID  . vancomycin  1,000 mg Intravenous Q12H   Continuous Infusions: PRN Meds:.acetaminophen **OR** acetaminophen, albuterol, bisacodyl, morphine injection, ondansetron **OR** ondansetron (ZOFRAN) IV  No Known Allergies  Physical Exam  Vitals  Blood pressure (!) 136/55, pulse 64, temperature 98.9 F (37.2 C), temperature source Oral, resp. rate 18, height 5\' 3"  (1.6 m), weight 89.9 kg (198 lb 3.2 oz), SpO2 94 %.  Lower Extremity exam:Overall the cellulitis and redness and swelling to the right lower extremity has improved. The redness around the ankle and proximal foot has improved. Still quite red along the dorsal and  dorsal lateral aspect of the foot which is expected this point. Packing was removed from the wound and there is still viable coloration to the fourth toe. Likely some nonviable tissue along the fourth metatarsal head region with a little duskiness to the lateral plantar aspect of the foot. Patient is scheduled for an angioplasty tomorrow. White count is down still slightly elevated patient is been afebrile for the last couple of temperature checks. She states she feels much better overall at this standpoint. Data Review  CBC  Recent Labs Lab 01/22/17 1146 01/23/17 0338 01/26/17 0526  WBC 16.1* 13.5* 12.8*  HGB 10.7* 9.6* 8.3*  HCT 31.5* 28.1* 24.3*  PLT 261 210 307  MCV 83.8 85.0 83.8  MCH 28.5 29.2 28.5  MCHC 34.0 34.3 34.0  RDW 12.6 12.6 12.8  LYMPHSABS 0.6*  --   --   MONOABS 1.1*  --   --   EOSABS 0.0  --   --   BASOSABS 0.1  --   --    ------------------------------------------------------------------------------------------------------------------  Chemistries   Recent Labs Lab 01/22/17 1146 01/23/17 0338 01/24/17 0343 01/26/17 0526  NA 132* 135 137 134*  K 2.9* 3.4* 4.4 3.5  CL 95* 102 103 104  CO2 29 27 26 24   GLUCOSE 259* 190* 151* 113*  BUN 22* 18 20 18   CREATININE 1.02* 0.98 0.91 0.83  CALCIUM 8.5* 7.7* 8.4* 7.9*  AST 36 32  --   --   ALT 24 22  --   --   ALKPHOS 79 70  --   --   BILITOT 0.6 0.6  --   --    ---------------------------------------------------------------------------------------------------------------------------------------------------------------------------------------------------------------------------------  Urinalysis    Component Value Date/Time   COLORURINE YELLOW (A) 01/22/2017 2124  APPEARANCEUR HAZY (A) 01/22/2017 2124   APPEARANCEUR Cloudy 01/05/2012 1249   LABSPEC 1.016 01/22/2017 2124   LABSPEC 1.027 01/05/2012 1249   PHURINE 5.0 01/22/2017 2124   GLUCOSEU NEGATIVE 01/22/2017 2124   GLUCOSEU 150 mg/dL 01/05/2012 1249    HGBUR NEGATIVE 01/22/2017 2124   BILIRUBINUR NEGATIVE 01/22/2017 2124   BILIRUBINUR Negative 01/05/2012 [place] 01/22/2017 2124   PROTEINUR NEGATIVE 01/22/2017 2124   NITRITE NEGATIVE 01/22/2017 2124   LEUKOCYTESUR MODERATE (A) 01/22/2017 2124   LEUKOCYTESUR Negative 01/05/2012 1249     Imaging results:   Dg Foot 2 Views Right  Result Date: 01/24/2017 CLINICAL DATA:  Diabetic foot infection EXAM: RIGHT FOOT - 2 VIEW COMPARISON:  None. FINDINGS: Soft tissue swelling and extensive gas dorsal to the fifth metatarsal phalangeal joint. This obscures bony detail. Cannot exclude osteomyelitis of the fifth proximal phalanx and/or distal fifth metatarsal. No other areas of osteomyelitis Degenerative change in the first MTP. Diffuse arterial calcification. Calcaneal spurring. IMPRESSION: Soft tissue swelling and gas surround the fifth metatarsal phalangeal joint. Probable underlying osteomyelitis not well visualized due to overlying gas. Electronically Signed   By: Franchot Gallo M.D.   On: 01/24/2017 14:18    Assessment & Plan: Overall the patient looks more stable the infection appears to be under better control at this point. Still uncertain as to the viability of other portions of her foot and will need to continue to reassess particularly after vascular procedure tomorrow. I'll have to remove more bone and possible fourth toe later this week. Plan: Repack the wound today and have patient continue stay nonweightbearing continue antibiotics. Will follow again tomorrow after she has her angioplasty and will continue to follow. I explained to her in detail her risk of potential losing more bone toes possibly a transmetatarsal amputation and possible loss of limb. She understands this and wants to try to preserve it  Principal Problem:   Cellulitis Active Problems:   Diabetes mellitus, type 2 ([place])   Morbid obesity ([place])   Hypokalemia     Family Communication: Plan discussed  with patient .   Perry Mount M.D on 01/26/2017 at 1:01 PM

## 2017-01-26 NOTE — Progress Notes (Addendum)
Per MD Schnier pt will have angiogram on Friday January 28, 2017. NPO d/c . Per MD ok to give Plavix.

## 2017-01-26 NOTE — Progress Notes (Addendum)
[facility] NAME: Elizabeth [name]    MR#:  [id number]  DATE OF BIRTH:  [birth year]  SUBJECTIVE:  Patient denies any complaints. She is status post PICC line placement.  REVIEW OF SYSTEMS:   Review of Systems  Constitutional: Negative for chills, fever and weight loss.  HENT: Negative for ear discharge, ear pain and nosebleeds.   Eyes: Negative for blurred vision, pain and discharge.  Respiratory: Negative for sputum production, shortness of breath, wheezing and stridor.   Cardiovascular: Negative for chest pain, palpitations, orthopnea and PND.  Gastrointestinal: Negative for abdominal pain, diarrhea, nausea and vomiting.  Genitourinary: Negative for frequency and urgency.  Musculoskeletal: Negative for back pain and joint pain.  Neurological: Positive for weakness. Negative for sensory change, speech change and focal weakness.  Psychiatric/Behavioral: Negative for depression and hallucinations. The patient is not nervous/anxious.    Tolerating Diet:yes Tolerating PT: pending  DRUG ALLERGIES:  No Known Allergies  VITALS:  Blood pressure (!) 136/55, pulse 64, temperature 98.9 F (37.2 C), temperature source Oral, resp. rate 18, height 5\' 3"  (1.6 m), weight 89.9 kg (198 lb 3.2 oz), SpO2 94 %.  PHYSICAL EXAMINATION:   Physical Exam  GENERAL:  66 y.o.-year-old patient lying in the bed with no acute distress.  EYES: Pupils equal, round, reactive to light and accommodation. No scleral icterus. Extraocular muscles intact.  HEENT: Head atraumatic, normocephalic. Oropharynx and nasopharynx clear.  NECK:  Supple, no jugular venous distention. No thyroid enlargement, no tenderness.  LUNGS: Normal breath sounds bilaterally, no wheezing, rales, rhonchi. No use of accessory muscles of respiration.  CARDIOVASCULAR: S1, S2 normal. No murmurs, rubs, or gallops.  ABDOMEN: Soft, nontender, nondistended. Bowel sounds present. No  organomegaly or mass.  EXTREMITIES: No cyanosis, clubbing or edema b/l.   Right foot dressing present. NEUROLOGIC: Cranial nerves II through XII are intact. No focal Motor or sensory deficits b/l.   PSYCHIATRIC:  patient is alert and oriented x 3.  SKIN: No obvious rash, lesion, or ulcer.   LABORATORY PANEL:  CBC  Recent Labs Lab 01/26/17 0526  WBC 12.8*  HGB 8.3*  HCT 24.3*  PLT 307    Chemistries   Recent Labs Lab 01/23/17 0338  01/26/17 0526  NA 135  < > 134*  K 3.4*  < > 3.5  CL 102  < > 104  CO2 27  < > 24  GLUCOSE 190*  < > 113*  BUN 18  < > 18  CREATININE 0.98  < > 0.83  CALCIUM 7.7*  < > 7.9*  AST 32  --   --   ALT 22  --   --   ALKPHOS 70  --   --   BILITOT 0.6  --   --   < > = values in this interval not displayed. Cardiac Enzymes  Recent Labs Lab 01/22/17 1146  TROPONINI 0.03*   RADIOLOGY:  No results found. ASSESSMENT AND PLAN:   Elizabeth [name] a 66 y.o.femalehas a past medical history significant for breast cancer, HTN, DM ,and lymphedema now with progressive pain, swelling, and redness of RLE with fever despite po Doxycycline. In ER, WBC elevated and pt was febrile.  1. Right lower extremity cellulitis with swelling/edema- osteomyelitis. Peripheral arterial disease. -Continue vanc cefepime and Zosyn. Once blood cultures are available deescalate Antibiotics. -Patient is status post PICC line placement. She'll need IV therapy for 4-6 weeks per ID recommendation   -When necessary pain  meds -Keep leg elevated -Lower extremity ultrasound negative for DVT - Called podiatry consult appreciated  Patient is status post  Debridement -Vascular is planning to do lower extremity angiogram.  2. Diabetes continue home meds and sliding scale  3. Chronic lymphedema   Vascular is suggesting to evaluate for lymph pump once infection is resolved.  4.. Hypertension continue home meds  5. DVT prophylaxis Lovenox  CM for discharge  planning.  Case discussed with Care Management/Social Worker. Management plans discussed with the patient, family and they are in agreement.  CODE STATUS: FULL  DVT Prophylaxis: lovenox  TOTAL TIME TAKING CARE OF THIS PATIENT: 30 minutes.  >50% time spent on counselling and coordination of care  POSSIBLE D/C IN 1-2* DAYS, DEPENDING ON CLINICAL CONDITION.  Note: This dictation was prepared with Dragon dictation along with smaller phrase technology. Any transcriptional errors that result from this process are unintentional.  Elizabeth [name] M.D on 01/26/2017 at 3:07 PM  Between 7am to 6pm - Pager - [phone]  After 6pm go to [email or web address] - password [email or web address] [facility]  Office  [phone]  CC: Primary care physician; Marguerita Merles, MD

## 2017-01-26 NOTE — Progress Notes (Signed)
Pt has scheduled aspirin and plavix. MD Posey Pronto notified, Per MD hold meds pending possible procedure tommorrow.

## 2017-01-26 NOTE — Progress Notes (Signed)
[facility]  Daily Progress Note   Subjective  -   Patient sitting up in bed she has tolerated her lunch well. She denies pain in her foot.  Objective Vitals:   01/26/17 0520 01/26/17 0813 01/26/17 0815 01/26/17 0818  BP: 127/60 (!) 136/55    Pulse: 65 64    Resp: 18 18    Temp: 99.3 F (37.4 C) 98.9 F (37.2 C)    TempSrc: Oral Oral    SpO2: 93% 93% (!) 87% 94%  Weight: 89.9 kg (198 lb 3.2 oz)     Height:        Intake/Output Summary (Last 24 hours) at 01/26/17 1528 Last data filed at 01/26/17 0558  Gross per 24 hour  Intake             1530 ml  Output                0 ml  Net             1530 ml    PULM  Normal effort , no use of accessory muscles CV  No JVD, RRR Abd      No distended, nontender VASC  Right foot dressed. Left foot pedal pulses nonpalpable. There is 3+ edema of the calf on the right  Laboratory CBC    Component Value Date/Time   WBC 12.8 (H) 01/26/2017 0526   HGB 8.3 (L) 01/26/2017 0526   HGB 12.8 11/18/2014 1011   HCT 24.3 (L) 01/26/2017 0526   HCT 38.8 11/18/2014 1011   PLT 307 01/26/2017 0526   PLT 264 11/18/2014 1011    BMET    Component Value Date/Time   NA 134 (L) 01/26/2017 0526   NA 139 11/18/2014 1011   K 3.5 01/26/2017 0526   K 3.7 11/18/2014 1011   CL 104 01/26/2017 0526   CL 100 11/18/2014 1011   CO2 24 01/26/2017 0526   CO2 35 (H) 11/18/2014 1011   GLUCOSE 113 (H) 01/26/2017 0526   GLUCOSE 70 11/18/2014 1011   BUN 18 01/26/2017 0526   BUN 22 (H) 11/18/2014 1011   CREATININE 0.83 01/26/2017 0526   CREATININE 0.88 11/18/2014 1011   CALCIUM 7.9 (L) 01/26/2017 0526   CALCIUM 9.0 11/18/2014 1011   GFRNONAA >60 01/26/2017 0526   GFRNONAA >60 11/18/2014 1011   GFRNONAA >60 01/04/2014 1825   GFRAA >60 01/26/2017 0526   GFRAA >60 11/18/2014 1011   GFRAA >60 01/04/2014 1825    Assessment/Planning:  1. 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  The patient will undergo right leg angiography with the hope for intervention for limb salvage on Friday, March 23.  Risk and benefits were reviewed the patient.  Indications for the procedure were reviewed.  All questions were answered, the patient agrees to proceed with angiography and intervention.   2. Diabetic foot abscess: Dr. Elvina Mattes debridement debrided this abscess today patient is also being seen by infectious disease for proper antibiotic coverage wound care will continue.  3. Diabetes mellitus: Patient's blood sugars will be monitored she will be maintained on a sliding scale  4. Lymphedema: Once her infection has been adequately treated and her wound is healing she can be assessed for lymph pump which I believe will be very helpful for her long-term.  5. Hypertension: Patient's vital signs will be monitored and her  home antihypertensives will be continued.   Hortencia Pilar  01/26/2017, 3:28 PM

## 2017-01-26 NOTE — Progress Notes (Signed)
[facility] INFECTIOUS DISEASE PROGRESS NOTE Date of Admission:  01/22/2017     ID: Elizabeth [name] is a 66 y.o. female with necrotizing foot infection  Principal Problem:   Cellulitis Active Problems:   Diabetes mellitus, type 2 ([place])   Morbid obesity ([place])   Hypokalemia   Subjective: Lower fever to 100.7, less pain   ROS  Eleven systems are reviewed and negative except per hpi  Medications:  Antibiotics Given (last 72 hours)    Date/Time Action Medication Dose Rate   01/23/17 1857 Given   piperacillin-tazobactam (ZOSYN) IVPB 3.375 g 3.375 g 12.5 mL/hr   01/24/17 0249 Given   piperacillin-tazobactam (ZOSYN) IVPB 3.375 g 3.375 g 12.5 mL/hr   01/24/17 0745 Given   vancomycin (VANCOCIN) IVPB 1000 mg/200 mL premix 1,000 mg 200 mL/hr   01/24/17 0916 Given   piperacillin-tazobactam (ZOSYN) IVPB 3.375 g 3.375 g 12.5 mL/hr   01/24/17 1751 Given   metroNIDAZOLE (FLAGYL) tablet 500 mg 500 mg    01/24/17 2153 Given   metroNIDAZOLE (FLAGYL) tablet 500 mg 500 mg    01/25/17 5397 Given   vancomycin (VANCOCIN) IVPB 1000 mg/200 mL premix 1,000 mg 200 mL/hr   01/25/17 0532 Given   ceFEPIme (MAXIPIME) 2 g in dextrose 5 % 50 mL IVPB 2 g 100 mL/hr   01/25/17 1755 Given   ceFEPIme (MAXIPIME) 2 g in dextrose 5 % 50 mL IVPB 2 g 100 mL/hr   01/25/17 2011 Given   vancomycin (VANCOCIN) IVPB 1000 mg/200 mL premix 1,000 mg 200 mL/hr   01/25/17 2012 Given   metroNIDAZOLE (FLAGYL) tablet 500 mg 500 mg    01/26/17 0558 Given   metroNIDAZOLE (FLAGYL) tablet 500 mg 500 mg    01/26/17 0558 Given   ceFEPIme (MAXIPIME) 2 g in dextrose 5 % 50 mL IVPB 2 g 100 mL/hr   01/26/17 0942 Given   vancomycin (VANCOCIN) IVPB 1000 mg/200 mL premix 1,000 mg 200 mL/hr     . aspirin  81 mg Oral Daily  . atenolol  50 mg Oral Daily  . brimonidine  1 drop Left Eye BID  . calcium-vitamin D  2 tablet Oral Q breakfast  . ceFEPIme (MAXIPIME) 2 GM IVP  2 g Intravenous Q12H  . Chlorhexidine Gluconate Cloth  6 each  Topical Q0600  . clopidogrel  75 mg Oral Daily  . docusate sodium  100 mg Oral BID  . famotidine  20 mg Oral BID  . furosemide  20 mg Oral Daily  . gabapentin  300 mg Oral TID  . heparin  5,000 Units Subcutaneous Q8H  . insulin aspart  0-15 Units Subcutaneous TID WC  . letrozole  2.5 mg Oral Daily  . linagliptin  5 mg Oral Daily  . lisinopril  10 mg Oral Daily  . metFORMIN  1,000 mg Oral BID WC  . metroNIDAZOLE  500 mg Oral Q8H  . mupirocin ointment  1 application Nasal BID  . pravastatin  40 mg Oral Daily  . timolol  1 drop Left Eye BID  . vancomycin  1,000 mg Intravenous Q12H    Objective: Vital signs in last 24 hours: Temp:  [98.9 F (37.2 C)-101 F (38.3 C)] 98.9 F (37.2 C) (03/21 0813) Pulse Rate:  [64-73] 64 (03/21 0813) Resp:  [16-25] 18 (03/21 0813) BP: (110-136)/(42-60) 136/55 (03/21 0813) SpO2:  [84 %-94 %] 94 % (03/21 0818) Weight:  [89.9 kg (198 lb 3.2 oz)] 89.9 kg (198 lb 3.2 oz) (03/21 0520) Constitutional:  Chronically ill apperaing HENT: [place]/AT, PERRLA, no scleral icterus Mouth/Throat: Oropharynx is clear and moist. No oropharyngeal exudate.  Cardiovascular: Normal rate, regular rhythm and normal heart sounds. Pulmonary/Chest: Effort normal and breath sounds normal. No respiratory distress.  has no wheezes.  Neck = supple, no nuchal rigidity Abdominal: Soft. Bowel sounds are normal.  exhibits no distension. There is no tenderness.  Lymphadenopathy: no cervical adenopathy. No axillary adenopathy Neurological: alert and oriented to person, place, and time.  Ext RLE with 2+ edema Skin:  L foot wrapped post op  Psychiatric: a normal mood and affect.  behavior is normal.   Lab Results  Recent Labs  01/24/17 0343 01/26/17 0526  WBC  --  12.8*  HGB  --  8.3*  HCT  --  24.3*  NA 137 134*  K 4.4 3.5  CL 103 104  CO2 26 24  BUN 20 18  CREATININE 0.91 0.83   Lab Results  Component Value Date   ESRSEDRATE 126 (H) 01/24/2017    Microbiology: Results  for orders placed or performed during the hospital encounter of 01/22/17  Blood Culture (routine x 2)     Status: None (Preliminary result)   Collection Time: 01/22/17 11:46 AM  Result Value Ref Range Status   Specimen Description BLOOD LEFT ASSIST CONTROL  Final   Special Requests BOTTLES DRAWN AEROBIC AND ANAEROBIC BCAV  Final   Culture NO GROWTH 4 DAYS  Final   Report Status PENDING  Incomplete  Blood Culture (routine x 2)     Status: None (Preliminary result)   Collection Time: 01/22/17 11:46 AM  Result Value Ref Range Status   Specimen Description BLOOD RIGHT ASSIST CONTROL  Final   Special Requests BOTTLES DRAWN AEROBIC AND ANAEROBIC BCAV  Final   Culture NO GROWTH 4 DAYS  Final   Report Status PENDING  Incomplete  MRSA PCR Screening     Status: Abnormal   Collection Time: 01/22/17  4:20 PM  Result Value Ref Range Status   MRSA by PCR POSITIVE (A) NEGATIVE Final    Comment:        The GeneXpert MRSA Assay (FDA approved for NASAL specimens only), is one component of a comprehensive MRSA colonization surveillance program. It is not intended to diagnose MRSA infection nor to guide or monitor treatment for MRSA infections. RESULT CALLED TO, READ BACK BY AND VERIFIED WITH: Talbot Grumbling @ 2536 01/22/17 by [facility]   Urine culture     Status: None   Collection Time: 01/22/17  9:24 PM  Result Value Ref Range Status   Specimen Description URINE, RANDOM  Final   Special Requests NONE  Final   Culture   Final    NO GROWTH Performed at [facility], [address] [place], [place] [address]    Report Status 01/25/2017 FINAL  Final  Aerobic/Anaerobic Culture (surgical/deep wound)     Status: None (Preliminary result)   Collection Time: 01/24/17  5:00 PM  Result Value Ref Range Status   Specimen Description FOOT  Final   Special Requests RIGHT FOOT NO ANAEROBIC SWAB SUBMITTED  Final   Gram Stain   Final    FEW WBC PRESENT, PREDOMINANTLY PMN MODERATE GRAM POSITIVE COCCI  IN PAIRS FEW GRAM POSITIVE COCCI IN CLUSTERS RARE GRAM NEGATIVE RODS RARE GRAM POSITIVE RODS Performed at [facility], [place] [address], [place], [place] [address]    Culture   Final    NORMAL SKIN FLORA NO ANAEROBES ISOLATED; CULTURE  IN PROGRESS FOR 5 DAYS    Report Status PENDING  Incomplete  C difficile quick scan w PCR reflex     Status: None   Collection Time: 01/25/17 12:00 AM  Result Value Ref Range Status   C Diff antigen NEGATIVE NEGATIVE Final   C Diff toxin NEGATIVE NEGATIVE Final   C Diff interpretation No C. difficile detected.  Final  Aerobic/Anaerobic Culture (surgical/deep wound)     Status: None (Preliminary result)   Collection Time: 01/25/17 12:35 PM  Result Value Ref Range Status   Specimen Description TISSUE  Final   Special Requests RIGHT FIFTH TOE  Final   Gram Stain   Final    RARE WBC PRESENT,BOTH PMN AND MONONUCLEAR NO ORGANISMS SEEN    Culture   Final    NO GROWTH < 24 HOURS Performed at [facility], [place] [address], [place], [place] [address]    Report Status PENDING  Incomplete    Studies/Results: Dg Foot 2 Views Right  Result Date: 01/24/2017 CLINICAL DATA:  Diabetic foot infection EXAM: RIGHT FOOT - 2 VIEW COMPARISON:  None. FINDINGS: Soft tissue swelling and extensive gas dorsal to the fifth metatarsal phalangeal joint. This obscures bony detail. Cannot exclude osteomyelitis of the fifth proximal phalanx and/or distal fifth metatarsal. No other areas of osteomyelitis Degenerative change in the first MTP. Diffuse arterial calcification. Calcaneal spurring. IMPRESSION: Soft tissue swelling and gas surround the fifth metatarsal phalangeal joint. Probable underlying osteomyelitis not well visualized due to overlying gas. Electronically Signed   By: Franchot Gallo M.D.   On: 01/24/2017 14:18    Assessment/Plan: Elizabeth [name] is a 66 y.o. female with DM, B CA admitted with celluliits, necrotizing infection and osteomyelitis of R foot .  Dr Elvina Mattes performed on 3/20 fifth ray amputation and debridement of deep necrotic tissue, concern also for 4th ray involvment. ESR is 126, crp 28, cx  With normal skin flora,  Recommendations Cont vanco cefepime and flagyl For vascular intervention Will need likely 4-6 weeks IV therapy  Will base final abx on culture results Thank you very much for the consult. Will follow with you.  FITZGERALD, DAVID P   01/26/2017, 1:58 PM

## 2017-01-27 LAB — SURGICAL PATHOLOGY

## 2017-01-27 LAB — CULTURE, BLOOD (ROUTINE X 2)
CULTURE: NO GROWTH
Culture: NO GROWTH

## 2017-01-27 LAB — GLUCOSE, CAPILLARY
GLUCOSE-CAPILLARY: 134 mg/dL — AB (ref 65–99)
GLUCOSE-CAPILLARY: 120 mg/dL — AB (ref 65–99)
Glucose-Capillary: 111 mg/dL — ABNORMAL HIGH (ref 65–99)

## 2017-01-27 MED ORDER — CEFAZOLIN IN D5W 1 GM/50ML IV SOLN
1.0000 g | INTRAVENOUS | Status: DC
  Filled 2017-01-27: qty 50

## 2017-01-27 MED ORDER — CEFAZOLIN SODIUM-DEXTROSE 2-3 GM-% IV SOLR
2.0000 g | INTRAVENOUS | Status: AC
  Filled 2017-01-27: qty 50

## 2017-01-27 MED ORDER — SODIUM CHLORIDE 0.9 % IV SOLN
INTRAVENOUS | Status: DC
Start: 2017-01-28 — End: 2017-01-31
  Administered 2017-01-29 – 2017-01-31 (×4): via INTRAVENOUS

## 2017-01-27 MED ORDER — PIPERACILLIN-TAZOBACTAM 3.375 G IVPB
3.3750 g | Freq: Three times a day (TID) | INTRAVENOUS | Status: DC
  Administered 2017-01-27 – 2017-02-01 (×13): 3.375 g via INTRAVENOUS
  Filled 2017-01-27 (×13): qty 50

## 2017-01-27 NOTE — Clinical Social Work Placement (Signed)
   CLINICAL SOCIAL WORK PLACEMENT  NOTE  Date:  01/27/2017  Patient Details  Name: Elizabeth [name] MRN: [id number] Date of Birth: 1951-10-18  Clinical Social Work is seeking post-discharge placement for this patient at the [place] level of care (*CSW will initial, date and re-position this form in  chart as items are completed):  Yes   Patient/family provided with [place] Work Department's list of facilities offering this level of care within the geographic area requested by the patient (or if unable, by the patient's family).  Yes   Patient/family informed of their freedom to choose among providers that offer the needed level of care, that participate in Medicare, Medicaid or managed care program needed by the patient, have an available bed and are willing to accept the patient.  Yes   Patient/family informed of [facility]'s ownership interest in [facility] and [facility], as well as of the fact that they are under no obligation to receive care at these facilities.  PASRR submitted to EDS on 01/27/17     PASRR number received on 01/27/17     Existing PASRR number confirmed on       FL2 transmitted to all facilities in geographic area requested by pt/family on 01/27/17     FL2 transmitted to all facilities within larger geographic area on       Patient informed that his/her managed care company has contracts with or will negotiate with certain facilities, including the following:            Patient/family informed of bed offers received.  Patient chooses bed at       Physician recommends and patient chooses bed at      Patient to be transferred to   on  .  Patient to be transferred to facility by       Patient family notified on   of transfer.  Name of family member notified:        PHYSICIAN       Additional Comment:    _______________________________________________ Donatello Kleve, Veronia Beets, LCSW 01/27/2017, 11:19 AM

## 2017-01-27 NOTE — Progress Notes (Signed)
Infectious Disease Long Term IV Antibiotic Orders  Diagnosis: Diabetic foot infection   Culture results Aerobic/Anaerobic Culture (surgical/deep wound) [[id number]] Collected: 01/24/17 1700  Order Status: Completed Specimen: Wound from Foot Updated: 01/26/17 1509   Specimen Description FOOT   Special Requests RIGHT FOOT NO ANAEROBIC SWAB SUBMITTED   Gram Stain --   FEW WBC PRESENT, PREDOMINANTLY PMN  MODERATE GRAM POSITIVE COCCI IN PAIRS  FEW GRAM POSITIVE COCCI IN CLUSTERS  RARE GRAM NEGATIVE RODS  RARE GRAM POSITIVE RODS    Culture --   NORMAL SKIN FLORA  FEW BACTEROIDES THETAIOTAOMICRON  BETA LACTAMASE POSITIVE  Performed at [facility], [place] [address] [place], [place] [address]    Report Status PENDING     Allergies: No Known Allergies Lab Results  Component Value Date   ESRSEDRATE 126 (H) 01/24/2017   Lab Results  Component Value Date   CREATININE 0.83 01/26/2017   Lab Results  Component Value Date   HGB 8.3 (L) 01/26/2017   Lab Results  Component Value Date   CRP 28.3 (H) 01/24/2017    Discharge antibiotics Zosyn 3.374  grams every 8 hours  PICC Care per protocol Labs weekly while on IV antibiotics      CBC w diff   Comprehensive met panel ESR CRP    Planned duration of antibiotics 4 weeks from surgery date 3/20  Stop date 4/18 Follow up clinic date prior to stop date - within 1-2 weeks discharge  FAX weekly labs to [phone]  Leonel Ramsay, MD

## 2017-01-27 NOTE — Progress Notes (Signed)
[facility] [place] NAME: Elizabeth [name]    MR#:  [id number]  DATE OF BIRTH:  06/25/1951  SUBJECTIVE:  Patient denies any complaints. She is status post PICC line placement.  REVIEW OF SYSTEMS:   Review of Systems  Constitutional: Negative for chills, fever and weight loss.  HENT: Negative for ear discharge, ear pain and nosebleeds.   Eyes: Negative for blurred vision, pain and discharge.  Respiratory: Negative for sputum production, shortness of breath, wheezing and stridor.   Cardiovascular: Negative for chest pain, palpitations, orthopnea and PND.  Gastrointestinal: Negative for abdominal pain, diarrhea, nausea and vomiting.  Genitourinary: Negative for frequency and urgency.  Musculoskeletal: Negative for back pain and joint pain.  Neurological: Positive for weakness. Negative for sensory change, speech change and focal weakness.  Psychiatric/Behavioral: Negative for depression and hallucinations. The patient is not nervous/anxious.    Tolerating Diet:yes Tolerating PT: pending  DRUG ALLERGIES:  No Known Allergies  VITALS:  Blood pressure 130/62, pulse (!) 58, temperature 98.2 F (36.8 C), temperature source Oral, resp. rate 16, height 5\' 3"  (1.6 m), weight 87.8 kg (193 lb 8 oz), SpO2 97 %.  PHYSICAL EXAMINATION:   Physical Exam  GENERAL:  66 y.o.-year-old patient lying in the bed with no acute distress.  EYES: Pupils equal, round, reactive to light and accommodation. No scleral icterus. Extraocular muscles intact.  HEENT: Head atraumatic, normocephalic. Oropharynx and nasopharynx clear.  NECK:  Supple, no jugular venous distention. No thyroid enlargement, no tenderness.  LUNGS: Normal breath sounds bilaterally, no wheezing, rales, rhonchi. No use of accessory muscles of respiration.  CARDIOVASCULAR: S1, S2 normal. No murmurs, rubs, or gallops.  ABDOMEN: Soft, nontender, nondistended. Bowel sounds present. No  organomegaly or mass.  EXTREMITIES: No cyanosis, clubbing or edema b/l.   Right foot dressing present. NEUROLOGIC: Cranial nerves II through XII are intact. No focal Motor or sensory deficits b/l.   PSYCHIATRIC:  patient is alert and oriented x 3.  SKIN: No obvious rash, lesion, or ulcer.   LABORATORY PANEL:  CBC  Recent Labs Lab 01/26/17 0526  WBC 12.8*  HGB 8.3*  HCT 24.3*  PLT 307    Chemistries   Recent Labs Lab 01/23/17 0338  01/26/17 0526  NA 135  < > 134*  K 3.4*  < > 3.5  CL 102  < > 104  CO2 27  < > 24  GLUCOSE 190*  < > 113*  BUN 18  < > 18  CREATININE 0.98  < > 0.83  CALCIUM 7.7*  < > 7.9*  AST 32  --   --   ALT 22  --   --   ALKPHOS 70  --   --   BILITOT 0.6  --   --   < > = values in this interval not displayed. Cardiac Enzymes  Recent Labs Lab 01/22/17 1146  TROPONINI 0.03*   RADIOLOGY:  No results found. ASSESSMENT AND PLAN:   Elizabeth [name] a 66 y.o.femalehas a past medical history significant for breast cancer, HTN, DM ,and lymphedema now with progressive pain, swelling, and redness of RLE with fever despite po Doxycycline. In ER, WBC elevated and pt was febrile.  1. Right lower extremity cellulitis with swelling/edema- osteomyelitis. And suspected Peripheral arterial disease. -Continue vanc, cefepime and flagyl -blood cultures negative -WC shows few bacteroides -Patient is status post PICC line placement. She'll need IV therapy for 4-6 weeks per ID recommendation -When necessary pain  meds -Lower extremity ultrasound negative for DVT -  podiatry consult appreciated  Patient is status post Debridement of right great toe on 01/25/17 -Vascular is planning to do lower extremity angiogram on Friday march 23 rd  2. Diabetes continue home meds and sliding scale  3. Chronic lymphedema/suspected PAD -cont ASA + Plavix  4.. Hypertension continue home meds  5. DVT prophylaxis Lovenox  CM for discharge planning. PT to see  today  Case discussed with Care Management/Social Worker. Management plans discussed with the patient, family and they are in agreement.  CODE STATUS: FULL  DVT Prophylaxis: lovenox  TOTAL TIME TAKING CARE OF THIS PATIENT: 30 minutes.  >50% time spent on counselling and coordination of care  POSSIBLE D/C IN 1-2 DAYS, DEPENDING ON CLINICAL CONDITION.  Note: This dictation was prepared with Dragon dictation along with smaller phrase technology. Any transcriptional errors that result from this process are unintentional.  Bryam Taborda M.D on 01/27/2017 at 7:27 AM  Between 7am to 6pm - Pager - [phone]  After 6pm go to [email or web address] - password [email or web address] [facility]  Office  [phone]  CC: Primary care physician; Marguerita Merles, MD

## 2017-01-27 NOTE — Progress Notes (Signed)
ID E note No fevers, for angio tomorrow Cx reviewed- no MRSA found despite + MRSA PCR   Elizabeth [name] a 66 y.o.femalewith DM, B CA admitted with celluliits, necrotizing infection and osteomyelitis of R foot . Dr Elvina Mattes performed on 3/20 fifth ray amputation and debridement of deep necrotic tissue, concern also for 4th ray involvment. ESR is 126, crp 28, cx  With normal skin flor and anaerobes   Recommendations Will consolidate to just zosyn IV For vascular intervention prior to dc Will need likely 4-6 weeks IV therapy - see abx order sheet

## 2017-01-27 NOTE — Consult Note (Signed)
Pharmacy Antibiotic Note  Elizabeth [name] is a 66 y.o. female admitted on 01/22/2017 with wound infection.  Pharmacy has been consulted for zosyn dosing.  Plan: Zosyn 3.375g IV q8h (4 hour infusion).  Height: 5\' 3"  (160 cm) Weight: 193 lb 8 oz (87.8 kg) IBW/kg (Calculated) : 52.4  Temp (24hrs), Avg:98.4 F (36.9 C), Min:98.2 F (36.8 C), Max:98.6 F (37 C)   Recent Labs Lab 01/22/17 1123 01/22/17 1146 01/23/17 0338 01/24/17 0343 01/25/17 1925 01/26/17 0526  WBC  --  16.1* 13.5*  --   --  12.8*  CREATININE  --  1.02* 0.98 0.91  --  0.83  LATICACIDVEN 1.8  --   --   --   --   --   VANCOTROUGH  --   --   --   --  14*  --     Estimated Creatinine Clearance: 71 mL/min (by C-G formula based on SCr of 0.83 mg/dL).    No Known Allergies  Antimicrobials this admission: vancomycin 3/17 >> 3/22 zosyn 3/17 >> 3/19, 3/22>> Cefepime 3/19>>3/22 Metronidazole 3/19>>3/22  Dose adjustments this admission:   Microbiology results: Recent Results (from the past 240 hour(s))  Blood Culture (routine x 2)     Status: None   Collection Time: 01/22/17 11:46 AM  Result Value Ref Range Status   Specimen Description BLOOD LEFT ASSIST CONTROL  Final   Special Requests BOTTLES DRAWN AEROBIC AND ANAEROBIC BCAV  Final   Culture NO GROWTH 5 DAYS  Final   Report Status 01/27/2017 FINAL  Final  Blood Culture (routine x 2)     Status: None   Collection Time: 01/22/17 11:46 AM  Result Value Ref Range Status   Specimen Description BLOOD RIGHT ASSIST CONTROL  Final   Special Requests BOTTLES DRAWN AEROBIC AND ANAEROBIC BCAV  Final   Culture NO GROWTH 5 DAYS  Final   Report Status 01/27/2017 FINAL  Final  MRSA PCR Screening     Status: Abnormal   Collection Time: 01/22/17  4:20 PM  Result Value Ref Range Status   MRSA by PCR POSITIVE (A) NEGATIVE Final    Comment:        The GeneXpert MRSA Assay (FDA approved for NASAL specimens only), is one component of a comprehensive MRSA  colonization surveillance program. It is not intended to diagnose MRSA infection nor to guide or monitor treatment for MRSA infections. RESULT CALLED TO, READ BACK BY AND VERIFIED WITH: Talbot Grumbling @ 1610 01/22/17 by [facility]   Urine culture     Status: None   Collection Time: 01/22/17  9:24 PM  Result Value Ref Range Status   Specimen Description URINE, RANDOM  Final   Special Requests NONE  Final   Culture   Final    NO GROWTH Performed at [facility], [address], [place], [place] [address]    Report Status 01/25/2017 FINAL  Final  Aerobic/Anaerobic Culture (surgical/deep wound)     Status: None (Preliminary result)   Collection Time: 01/24/17  5:00 PM  Result Value Ref Range Status   Specimen Description FOOT  Final   Special Requests RIGHT FOOT NO ANAEROBIC SWAB SUBMITTED  Final   Gram Stain   Final    FEW WBC PRESENT, PREDOMINANTLY PMN MODERATE GRAM POSITIVE COCCI IN PAIRS FEW GRAM POSITIVE COCCI IN CLUSTERS RARE GRAM NEGATIVE RODS RARE GRAM POSITIVE RODS    Culture   Final    NORMAL SKIN FLORA FEW BACTEROIDES THETAIOTAOMICRON BETA LACTAMASE POSITIVE  Performed at [facility], [place] [address] [place], [place] [address]    Report Status PENDING  Incomplete  C difficile quick scan w PCR reflex     Status: None   Collection Time: 01/25/17 12:00 AM  Result Value Ref Range Status   C Diff antigen NEGATIVE NEGATIVE Final   C Diff toxin NEGATIVE NEGATIVE Final   C Diff interpretation No C. difficile detected.  Final  Aerobic/Anaerobic Culture (surgical/deep wound)     Status: None (Preliminary result)   Collection Time: 01/25/17 12:35 PM  Result Value Ref Range Status   Specimen Description TISSUE  Final   Special Requests RIGHT FIFTH TOE  Final   Gram Stain   Final    RARE WBC PRESENT,BOTH PMN AND MONONUCLEAR NO ORGANISMS SEEN    Culture   Final    NO GROWTH 2 DAYS NO ANAEROBES ISOLATED; CULTURE IN PROGRESS FOR 5 DAYS Performed at [facility], [place] [address] [place], [place] [address]    Report Status PENDING  Incomplete     Thank you for allowing pharmacy to be a part of this patient's care.  Ramond Dial, Pharm.D, BCPS Clinical Pharmacist  01/27/2017 4:31 PM

## 2017-01-27 NOTE — Evaluation (Signed)
Physical Therapy Evaluation Patient Details Name: Elizabeth [name] MRN: [id number] DOB: 05/10/1951 Today's Date: 01/27/2017   History of Present Illness  66 y/o female here with R LE cellulitis.  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  Clinical Impression  Pt low energy t/o session and seemed to be struggling with the idea of losing some of her foot and the potential for further surgeries.  She did well with bed mobility and getting to sitting but she was very limited with ability to get to standing and was unable to do any hopping, shuffling, heel-toeing in standing w/o excessive max assist to keep weight off R LE (post-op boot donned).  Pt very limited with mobility and though she does not want to go to a rehab facility she did acknowledge that she would be completely unsafe at home and does need to improve mobility before she is safe to do so.  Pt showed good effort in trying to get to sitting and transfer to recliner, but ultimately even with heavy verbal and physical assist she was unable to take even a single reasonable step.      Follow Up Recommendations SNF    Equipment Recommendations       Recommendations for Other Services       Precautions / Restrictions Precautions Precautions: Fall Restrictions Weight Bearing Restrictions: Yes RLE Weight Bearing: Non weight bearing Other Position/Activity Restrictions: post-op boot      Mobility  Bed Mobility Overal bed mobility: Modified Independent             General bed mobility comments: Pt did surprisingly well getting herself up to sitting EOB with heavy UE use on rails and segmented movement of LEs   Transfers Overall transfer level: Needs assistance Equipment used: Rolling walker (2 wheeled) Transfers: Sit to/from Stand Sit to Stand: Max assist         General transfer comment: Pt essentially unable to assist getting to standing. First attempt was  total assist and pt unable to achieve upright.  Elevated bed for second attempt and pt still needed max assist and struggled to do much meaningful lifting to standing.   Ambulation/Gait             General Gait Details: Pt unable to do any real ambulation.  She did struggle to heel-toe her L foot to the side to get to recliner but with that put weight through L LE (and even more so with the one attempt at hopping step).  Pt unsafe, very weak and ultimately very limited with any upright mobility.  Post-op boot on R, unable to keep weight off it during any movement.  Stairs            Wheelchair Mobility    Modified Rankin (Stroke Patients Only)       Balance Overall balance assessment: Needs assistance Sitting-balance support: Single extremity supported Sitting balance-Leahy Scale: Good Sitting balance - Comments: Pt able to sit at EOB w/o safety issues   Standing balance support: Bilateral upper extremity supported Standing balance-Leahy Scale: Zero Standing balance comment: Pt needing max assist to get to standing and to maintain standing                              Pertinent Vitals/Pain Pain Assessment: 0-10 Pain Score: 7  Pain Location: R LE    Home Living Family/patient expects to be  discharged to:: Skilled nursing facility Living Arrangements: Spouse/significant other                    Prior Function Level of Independence: Independent with assistive device(s)         Comments: Pt reports she was able to get out and be active, though she no longer drives      Hand Dominance        Extremity/Trunk Assessment   Upper Extremity Assessment Upper Extremity Assessment: Overall WFL for tasks assessed    Lower Extremity Assessment Lower Extremity Assessment: Generalized weakness (R LE grossly 3/5, deferred ankle/foot resistance)       Communication   Communication: No difficulties  Cognition Arousal/Alertness:  Lethargic Behavior During Therapy:  (willing to participate, minimally interactive) Overall Cognitive Status: Within Functional Limits for tasks assessed                      General Comments      Exercises     Assessment/Plan    PT Assessment Patient needs continued PT services  PT Problem List Decreased strength;Decreased range of motion;Decreased activity tolerance;Decreased balance;Decreased mobility;Decreased coordination;Decreased cognition;Decreased knowledge of use of DME;Decreased safety awareness;Pain       PT Treatment Interventions DME instruction;Gait training;Stair training;Functional mobility training;Therapeutic activities;Therapeutic exercise;Balance training;Neuromuscular re-education;Patient/family education    PT Goals (Current goals can be found in the Care Plan section)  Acute Rehab PT Goals Patient Stated Goal: save the leg, get back to walking PT Goal Formulation: With patient Time For Goal Achievement: 02/10/17 Potential to Achieve Goals: Fair    Frequency 7X/week   Barriers to discharge        Co-evaluation               End of Session Equipment Utilized During Treatment: Gait belt Activity Tolerance: Patient limited by fatigue Patient left: with chair alarm set;with call bell/phone within reach Nurse Communication: Mobility status PT Visit Diagnosis: Muscle weakness (generalized) (M62.81);Difficulty in walking, not elsewhere classified (R26.2)         Time: 4196-2229 PT Time Calculation (min) (ACUTE ONLY): 28 min   Charges:   PT Evaluation $PT Eval Low Complexity: 1 Procedure PT Treatments $Therapeutic Activity: 8-22 mins   PT G Codes:         Kreg Shropshire, DPT 01/27/2017, 11:10 AM

## 2017-01-27 NOTE — Progress Notes (Signed)
Ancef 1g surgical prophylaxis changed to ancef 2g once due to pt weight >80kg per protocol  Tela Kotecki D Vittorio Mohs, Pharm.D, BCPS Clinical Pharmacist

## 2017-01-27 NOTE — Progress Notes (Signed)
Patient Demographics  Elizabeth [name], is a 66 y.o. female   MRN: [id number]   DOB - 1951/08/08  Admit Date - 01/22/2017    Outpatient Primary MD for the patient is Marguerita Merles, MD  Consult requested in the Hospital by Fritzi Mandes, MD, On 01/27/2017   With History of -  Past Medical History:  Diagnosis Date  . Cancer [facility])    breast  . Carcinoma of right breast ([place]) 05/20/2014  . Diabetes mellitus without complication ([place])   . Hypertension   . Lymphedema   . PONV (postoperative nausea and vomiting)   . Psoriasis 1990  . Stroke ([place]) 2000   residual left arm weakness  . Varicose veins       Past Surgical History:  Procedure Laterality Date  . AMPUTATION TOE Right 01/25/2017   Procedure: AMPUTATION TOE   ;  Surgeon: Albertine Patricia, DPM;  Location: [facility] ORS;  Service: Podiatry;  Laterality: Right;  . BREAST EXCISIONAL BIOPSY Right 2015   positive, with radiation  . BREAST LUMPECTOMY Right 04-2014   followed by radiation,  no chemo  . BREAST REDUCTION SURGERY  1977  . CHOLECYSTECTOMY N/A 09/23/2015   Procedure: LAPAROSCOPIC CHOLECYSTECTOMY;  Surgeon: Bonner Puna, MD;  Location: [facility] ORS;  Service: General;  Laterality: N/A;  . DILATION AND CURETTAGE OF UTERUS  2010  . EYE SURGERY Left 2015   cataracts and left eye detached retina repair  . EYE SURGERY Right 2013   cataract with len implant  . GASTRIC ROUX-EN-Y N/A 09/23/2015   Procedure: LAPAROSCOPIC ROUX-EN-Y GASTRIC;  Surgeon: Bonner Puna, MD;  Location: [facility] ORS;  Service: General;  Laterality: N/A;  . HIATAL HERNIA REPAIR N/A 09/23/2015   Procedure: LAPAROSCOPIC REPAIR OF HIATAL HERNIA;  Surgeon: Bonner Puna, MD;  Location: [facility] ORS;  Service: General;  Laterality: N/A;  . IRRIGATION AND DEBRIDEMENT FOOT Right 01/25/2017   Procedure: IRRIGATION  AND DEBRIDEMENT FOOT;  Surgeon: Albertine Patricia, DPM;  Location: [facility] ORS;  Service: Podiatry;  Laterality: Right;  . REDUCTION MAMMAPLASTY      in for   Chief Complaint  Patient presents with  . Cellulitis     HPI  Elizabeth [name]  is a 66 y.o. female, 2 days status post surgery on the right foot. She had a severe infection in the right foot that involves skin and skin structures soft tissue day tissue muscle tendon and bone. It up having to do a fifth ray amputation 2 days ago and had leave the wound open for packing still some questionable tissue particular around the fourth metatarsal area. This scheduled for vascular procedure tomorrow for catheterization and possible angioplasty    Review of Systems    In addition to the HPI above,  No Fever-chills, No Headache, No changes with Vision or hearing, No problems swallowing food or Liquids, No Chest pain, Cough or Shortness of Breath, No Abdominal pain, No Nausea or Vommitting, Bowel movements are regular, No Blood in stool or Urine, No dysuria, No new skin rashes or bruises, No new joints pains-aches,  No new weakness, tingling, numbness in any extremity, No recent weight gain or loss, No polyuria, polydypsia or polyphagia,  No significant Mental Stressors.  A full 10 point Review of Systems was done, except as stated above, all other Review of Systems were negative.   Social History Social History  Substance Use Topics  . Smoking status: Never Smoker  . Smokeless tobacco: Never Used  . Alcohol use No   Family History Family History  Problem Relation Age of Onset  . Diabetes Mother   . Cancer Mother   . Breast cancer Mother 13  . Diabetes Father   . Diabetes Sister   . Diabetes Brother     Prior to Admission medications   Medication Sig Start Date End Date Taking? Authorizing Provider  albuterol (PROVENTIL HFA;VENTOLIN HFA) 108 (90 BASE) MCG/ACT inhaler Inhale 2 puffs into the lungs every 6 (six) hours as  needed for wheezing or shortness of breath.   Yes Historical Provider, MD  calcium-vitamin D (OSCAL WITH D) 500-200 MG-UNIT tablet Take 2 tablets by mouth daily with breakfast. 11/23/16  Yes Lloyd Huger, MD  clopidogrel (PLAVIX) 75 MG tablet Take 75 mg by mouth. 11/06/15  Yes Historical Provider, MD  furosemide (LASIX) 20 MG tablet Take 20 mg by mouth daily.   Yes Historical Provider, MD  gabapentin (NEURONTIN) 300 MG capsule Take 300 mg by mouth 3 (three) times daily.   Yes Historical Provider, MD  letrozole (FEMARA) 2.5 MG tablet Take 1 tablet (2.5 mg total) by mouth daily. 11/23/16  Yes Lloyd Huger, MD  linagliptin (TRADJENTA) 5 MG TABS tablet Take 5 mg by mouth daily.   Yes Historical Provider, MD  lisinopril (PRINIVIL,ZESTRIL) 10 MG tablet Take 10 mg by mouth daily.   Yes Historical Provider, MD  metFORMIN (GLUCOPHAGE) 1000 MG tablet Take 1,000 mg by mouth 2 (two) times daily with a meal.   Yes Historical Provider, MD  pravastatin (PRAVACHOL) 40 MG tablet Take 40 mg by mouth daily. In afternoon   Yes Historical Provider, MD  aspirin 81 MG tablet Take 81 mg by mouth daily. Reported on 05/25/2016    Historical Provider, MD  atenolol (TENORMIN) 50 MG tablet Take 50 mg by mouth daily. Reported on 05/25/2016    Historical Provider, MD  brimonidine (ALPHAGAN) 0.2 % ophthalmic solution Place 1 drop into the left eye 2 (two) times daily. Reported on 05/25/2016    Historical Provider, MD  insulin lispro (HUMALOG) 100 UNIT/ML injection Inject into the skin 3 (three) times daily before meals. Reported on 05/25/2016    Historical Provider, MD  ranitidine (ZANTAC) 150 MG tablet Take 150 mg by mouth 2 (two) times daily. Reported on 05/25/2016    Historical Provider, MD  timolol (BETIMOL) 0.5 % ophthalmic solution Place 1 drop into the left eye 2 (two) times daily. Reported on 05/25/2016    Historical Provider, MD    Anti-infectives    Start     Dose/Rate Route Frequency Ordered Stop   01/28/17 0000   ceFAZolin (ANCEF) IVPB 1 g/50 mL premix    Comments:  Send with pt to OR   1 g 100 mL/hr over 30 Minutes Intravenous On call 01/27/17 1335 01/29/17 0000   01/26/17 0800  vancomycin (VANCOCIN) IVPB 1000 mg/200 mL premix  Status:  Discontinued     1,000 mg 200 mL/hr over 60 Minutes Intravenous Every 12 hours 01/25/17 2039 01/27/17 0957   01/24/17 1800  ceFEPIme (MAXIPIME) 2 g in dextrose 5 % 50 mL IVPB  Status:  Discontinued     2 g 100 mL/hr over 30 Minutes Intravenous Every  12 hours 01/24/17 1747 01/27/17 0957   01/24/17 1730  metroNIDAZOLE (FLAGYL) tablet 500 mg  Status:  Discontinued     500 mg Oral Every 8 hours 01/24/17 1713 01/27/17 0957   01/22/17 2000  vancomycin (VANCOCIN) IVPB 1000 mg/200 mL premix  Status:  Discontinued     1,000 mg 200 mL/hr over 60 Minutes Intravenous Every 18 hours 01/22/17 1650 01/25/17 2039   01/22/17 1800  piperacillin-tazobactam (ZOSYN) IVPB 3.375 g  Status:  Discontinued     3.375 g 12.5 mL/hr over 240 Minutes Intravenous Every 8 hours 01/22/17 1650 01/24/17 1713   01/22/17 1130  piperacillin-tazobactam (ZOSYN) IVPB 3.375 g     3.375 g 100 mL/hr over 30 Minutes Intravenous  Once 01/22/17 1123 01/22/17 1235   01/22/17 1130  vancomycin (VANCOCIN) IVPB 1000 mg/200 mL premix     1,000 mg 200 mL/hr over 60 Minutes Intravenous  Once 01/22/17 1123 01/22/17 1357      Scheduled Meds: . aspirin  81 mg Oral Daily  . atenolol  50 mg Oral Daily  . calcium-vitamin D  2 tablet Oral Q breakfast  . [START ON 01/28/2017]  ceFAZolin (ANCEF) IV  1 g Intravenous On Call  . Chlorhexidine Gluconate Cloth  6 each Topical Q0600  . clopidogrel  75 mg Oral Daily  . docusate sodium  100 mg Oral BID  . famotidine  20 mg Oral BID  . furosemide  20 mg Oral Daily  . gabapentin  300 mg Oral TID  . heparin  5,000 Units Subcutaneous Q8H  . insulin aspart  0-15 Units Subcutaneous TID WC  . letrozole  2.5 mg Oral Daily  . linagliptin  5 mg Oral Daily  . lisinopril  10 mg Oral  Daily  . metFORMIN  1,000 mg Oral BID WC  . mupirocin ointment  1 application Nasal BID  . pravastatin  40 mg Oral Daily   Continuous Infusions: . [START ON 01/28/2017] sodium chloride     PRN Meds:.acetaminophen **OR** acetaminophen, albuterol, bisacodyl, morphine injection, ondansetron **OR** ondansetron (ZOFRAN) IV  No Known Allergies  Physical Exam  Vitals  Blood pressure (!) 133/56, pulse (!) 58, temperature 98.4 F (36.9 C), temperature source Oral, resp. rate 16, height 5\' 3"  (1.6 m), weight 87.8 kg (193 lb 8 oz), SpO2 94 %.  Lower Extremity exam:Dressing and wound packing changed today. Patient's been sitting with her leg down a lot so her foot and leg are more swollen and she has more serous drainage coming from the wound. Overall it has improved as far as not amount of redness and cellulitis but more swollen today because she's had it down most of the day while sitting in a chair. No evidence of further pus drainage is noted some of the redness to the ankle and foot are improved. White count is down from 16-12.8  Data Review  CBC  Recent Labs Lab 01/22/17 1146 01/23/17 0338 01/26/17 0526  WBC 16.1* 13.5* 12.8*  HGB 10.7* 9.6* 8.3*  HCT 31.5* 28.1* 24.3*  PLT 261 210 307  MCV 83.8 85.0 83.8  MCH 28.5 29.2 28.5  MCHC 34.0 34.3 34.0  RDW 12.6 12.6 12.8  LYMPHSABS 0.6*  --   --   MONOABS 1.1*  --   --   EOSABS 0.0  --   --   BASOSABS 0.1  --   --    ------------------------------------------------------------------------------------------------------------------  Chemistries   Recent Labs Lab 01/22/17 1146 01/23/17 0338 01/24/17 0343 01/26/17 0526  NA  132* 135 137 134*  K 2.9* 3.4* 4.4 3.5  CL 95* 102 103 104  CO2 29 27 26 24   GLUCOSE 259* 190* 151* 113*  BUN 22* 18 20 18   CREATININE 1.02* 0.98 0.91 0.83  CALCIUM 8.5* 7.7* 8.4* 7.9*  AST 36 32  --   --   ALT 24 22  --   --   ALKPHOS 79 70  --   --   BILITOT 0.6 0.6  --   --     --------------------------------------------------------------------------------------------------------------   Assessment & Plan: Overall the infection is stabilizing she has swelling due to the fact she is set with her leg in a dependent position all day. Hopefully we can get some better blood flow to the foot and see if that helps to promote further healing. She will need other revisional surgery on this foot once we get the infection and vascular improved. I will see her tomorrow after she has her procedure and we'll follow her over the weekend. I explained to her she is to sit with her foot elevated instead of the bend down all the time.  Principal Problem:   Cellulitis Active Problems:   Diabetes mellitus, type 2 ([place])   Morbid obesity ([place])   Hypokalemia    Family Communication: Plan discussed with patient   Perry Mount DPM

## 2017-01-27 NOTE — Clinical Social Work Note (Signed)
Clinical Social Work Assessment  Patient Details  Name: Elizabeth [name] MRN: [id number] Date of Birth: 1951/10/05  Date of referral:  01/27/17               Reason for consult:  Facility Placement, Discharge Planning                Permission sought to share information with:  Chartered certified accountant granted to share information::  Yes, Verbal Permission Granted  Name::      [place]::   Portal   Relationship::     Contact Information:     Housing/Transportation Living arrangements for the past 2 months:  [place] of Information:  Patient Patient Interpreter Needed:  None Criminal Activity/Legal Involvement Pertinent to Current Situation/Hospitalization:  No - Comment as needed Significant Relationships:  Adult Children Lives with:  Adult Children Do you feel safe going back to the place where you live?  Yes Need for family participation in patient care:  Yes (Comment)  Care giving concerns:  Patient lives at home with her two sons [place] and [place] in [place].    Social Worker assessment / plan:  Social work Theatre manager noted that PT is recommending SNF. Social work Theatre manager met with patient alone at bedside. Patient was sitting up in the bedside chair and watching TV. Social work Theatre manager introduced self and explained role of social work department. Per patient, she lives in [place] with her two sons [place] and [place]. Patient does not have an HPOA at this time. Social work Theatre manager explained that PT is recommending SNF. Patient is preferring to go to [facility]. Magda Paganini, admissions coordinator at [facility] is aware of above. Social work Theatre manager explained to patient that [employer] authorization would have to be received in order to go to facility. Patient verbally agreed she understood. Social work Theatre manager will continue to assist and follow as needed.   FL2 completed and faxed out.   Employment status:   Unemployed Nurse, adult PT Recommendations:  [place] / Referral to community resources:  [place]  Patient/Family's Response to care:  Patient has chose to go to [facility], but is preferring to go home.   Patient/Family's Understanding of and Emotional Response to Diagnosis, Current Treatment, and Prognosis:  Patient was pleasant and thanked social work Theatre manager for visiting.   Emotional Assessment Appearance:  Appears stated age Attitude/Demeanor/Rapport:    Affect (typically observed):  Accepting, Adaptable, Appropriate Orientation:  Oriented to Self, Oriented to Place, Oriented to  Time, Oriented to Situation Alcohol / Substance use:  Not Applicable Psych involvement (Current and /or in the community):  No (Comment)  Discharge Needs  Concerns to be addressed:  Basic Needs Readmission within the last 30 days:  No Current discharge risk:  Dependent with Mobility Barriers to Discharge:  Continued Medical Work up   [employer], [place] Work 01/27/2017, 11:16 AM

## 2017-01-27 NOTE — Progress Notes (Signed)
PHARMACY CONSULT NOTE FOR:  OUTPATIENT  PARENTERAL ANTIBIOTIC THERAPY (OPAT)  Indication: Osteomyelitis/wound infection Regimen: Zosyn 3.375 gm IV Q8H (extended infusion over 4 hours) End date: 04 March 2017 (6 weeks)  IV antibiotic discharge orders are pended. To discharging provider:  please sign these orders via discharge navigator,  Select New Orders & click on the button choice - Manage This Unsigned Work.     Thank you for allowing pharmacy to be a part of this patient's care.  Laural Benes, Pharm.D., BCPS Clinical Pharmacist 01/27/2017, 10:27 AM

## 2017-01-27 NOTE — Progress Notes (Addendum)
Social work Theatre manager met with patient alone at bedside. Social work Theatre manager notified patient that [facility] SNF can accept her. Patient verbally agreed she understood but would still like to go home if possible. Per Magda Paganini, admissions coordinator at [facility], she will start [employer] authorization today. Social work Theatre manager will continue to assist and follow as needed.   Danie Chandler, Social Work Intern  [phone]

## 2017-01-27 NOTE — NC FL2 (Signed)
[employer] LEVEL OF CARE SCREENING TOOL     IDENTIFICATION  Patient Name: Elizabeth [name] Birthdate: [birth year] Sex: female Admission Date (Current Location): 01/22/2017  [place] and [place] Number:  Engineering geologist and Address:  [facility], [address], [place], [place] [address]      Provider Number: [id number]  Attending Physician Name and Address:  Fritzi Mandes, MD  Relative Name and Phone Number:       Current Level of Care: Hospital Recommended Level of Care: [place] Prior Approval Number:    Date Approved/Denied:   PASRR Number:  [id number])  Discharge Plan: SNF    Current Diagnoses: Patient Active Problem List   Diagnosis Date Noted  . Cellulitis 01/22/2017  . Hypokalemia 01/22/2017  . Morbid obesity ([place]) 09/23/2015  . Diabetes mellitus, type 2 ([place]) 05/21/2015  . Peripheral blood vessel disorder ([place]) 05/21/2015  . 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 ([place]) 05/21/2015  . Chronic venous insufficiency 02/05/2015  . Mixed incontinence 10/24/2014  . Extreme obesity ([place]) 10/24/2014  . Hernia, rectovaginal 10/24/2014  . Airway hyperreactivity 02/19/2013  . Cerebral vascular accident ([place]) 02/19/2013  . Clinical depression 02/19/2013  . Diabetes mellitus type 2 in obese ([place]) 02/19/2013  . Colon, diverticulosis 02/19/2013  . Acid reflux 02/19/2013  . HLD (hyperlipidemia) 02/19/2013  . BP (high blood pressure) 02/19/2013    Orientation RESPIRATION BLADDER Height & Weight     Self, Time, Situation, Place  Normal Incontinent Weight: 193 lb 8 oz (87.8 kg) Height:  5\' 3"  (160 cm)  BEHAVIORAL SYMPTOMS/MOOD NEUROLOGICAL BOWEL NUTRITION STATUS   (None.)  (None. ) Continent Diet (Diet: Carb Modified Fluid)  AMBULATORY STATUS COMMUNICATION OF NEEDS Skin   Extensive Assist Verbally Surgical wounds (Incision: Right Foot, Diabetic Ulcer: Right Ulcer)                        Personal Care Assistance Level of Assistance  Bathing, Feeding, Dressing Bathing Assistance: Limited assistance Feeding assistance: Independent Dressing Assistance: Limited assistance     Functional Limitations Info  Sight, Hearing, Speech Sight Info: Adequate Hearing Info: Adequate Speech Info: Adequate    SPECIAL CARE FACTORS FREQUENCY  PT (By licensed PT), OT (By licensed OT)     PT Frequency:  (5) OT Frequency:  (5)            Contractures      Additional Factors Info  Code Status, Allergies, Insulin Sliding Scale Code Status Info:  (Full Code) Allergies Info:  (No Known Allergies)   Insulin Sliding Scale Info:  (NovoLog)       Current Medications (01/27/2017):  This is the current hospital active medication list Current Facility-Administered Medications  Medication Dose Route Frequency Provider Last Rate Last Dose  . acetaminophen (TYLENOL) tablet 650 mg  650 mg Oral Q6H PRN Idelle Crouch, MD   650 mg at 01/27/17 0847   Or  . acetaminophen (TYLENOL) suppository 650 mg  650 mg Rectal Q6H PRN Idelle Crouch, MD      . albuterol (PROVENTIL) (2.5 MG/3ML) 0.083% nebulizer solution 3 mL  3 mL Inhalation Q6H PRN Idelle Crouch, MD   3 mL at 01/25/17 0854  . aspirin chewable tablet 81 mg  81 mg Oral Daily Idelle Crouch, MD   81 mg at 01/27/17 1033  . atenolol (TENORMIN) tablet 50 mg  50 mg Oral Daily Idelle Crouch, MD  50 mg at 01/26/17 0943  . bisacodyl (DULCOLAX) suppository 10 mg  10 mg Rectal Daily PRN Idelle Crouch, MD      . brimonidine (ALPHAGAN) 0.2 % ophthalmic solution 1 drop  1 drop Left Eye BID Idelle Crouch, MD   1 drop at 01/26/17 0944  . calcium-vitamin D (OSCAL WITH D) 500-200 MG-UNIT per tablet 2 tablet  2 tablet Oral Q breakfast Idelle Crouch, MD   2 tablet at 01/27/17 0800  . Chlorhexidine Gluconate Cloth 2 % PADS 6 each  6 each Topical Q0600 Vaughan Basta, MD   6 each at 01/26/17 0830  . clopidogrel (PLAVIX) tablet 75  mg  75 mg Oral Daily Idelle Crouch, MD   75 mg at 01/27/17 1033  . docusate sodium (COLACE) capsule 100 mg  100 mg Oral BID Idelle Crouch, MD   100 mg at 01/27/17 1000  . famotidine (PEPCID) tablet 20 mg  20 mg Oral BID Idelle Crouch, MD   20 mg at 01/27/17 1033  . furosemide (LASIX) tablet 20 mg  20 mg Oral Daily Idelle Crouch, MD   20 mg at 01/27/17 1032  . gabapentin (NEURONTIN) capsule 300 mg  300 mg Oral TID Idelle Crouch, MD   300 mg at 01/27/17 1031  . heparin injection 5,000 Units  5,000 Units Subcutaneous Q8H Idelle Crouch, MD   5,000 Units at 01/27/17 0636  . insulin aspart (novoLOG) injection 0-15 Units  0-15 Units Subcutaneous TID WC Vaughan Basta, MD   2 Units at 01/27/17 0857  . letrozole [facility]) tablet 2.5 mg  2.5 mg Oral Daily Idelle Crouch, MD   2.5 mg at 01/27/17 1032  . linagliptin (TRADJENTA) tablet 5 mg  5 mg Oral Daily Idelle Crouch, MD   5 mg at 01/27/17 1034  . lisinopril (PRINIVIL,ZESTRIL) tablet 10 mg  10 mg Oral Daily Idelle Crouch, MD   10 mg at 01/27/17 1032  . metFORMIN (GLUCOPHAGE) tablet 1,000 mg  1,000 mg Oral BID WC Idelle Crouch, MD   1,000 mg at 01/27/17 3291  . morphine 2 MG/ML injection 2 mg  2 mg Intravenous Q2H PRN Idelle Crouch, MD      . mupirocin ointment (BACTROBAN) 2 % 1 application  1 application Nasal BID Vaughan Basta, MD   1 application at 91/66/06 1031  . ondansetron (ZOFRAN) tablet 4 mg  4 mg Oral Q6H PRN Idelle Crouch, MD       Or  . ondansetron [facility]) injection 4 mg  4 mg Intravenous Q6H PRN Idelle Crouch, MD      . pravastatin (PRAVACHOL) tablet 40 mg  40 mg Oral Daily Idelle Crouch, MD   40 mg at 01/26/17 1754  . timolol (TIMOPTIC) 0.5 % ophthalmic solution 1 drop  1 drop Left Eye BID Idelle Crouch, MD   1 drop at 01/26/17 0045     Discharge Medications: Please see discharge summary for a list of discharge medications.  Relevant Imaging Results:  Relevant Lab  Results:   Additional Information  (SSN: [id number]. Patient will need IV anitbiotic (Zosyn) for 4-6 weeeks. )  Danie Chandler, Student-Social Work

## 2017-01-28 ENCOUNTER — Encounter
Admission: EM | Disposition: A | Discharge status: Skilled Nursing Facility | Payer: Self-pay | Source: Home / Self Care | Attending: Specialist

## 2017-01-28 DIAGNOSIS — E1151 Type 2 diabetes mellitus with diabetic peripheral angiopathy without gangrene: Secondary | ICD-10-CM

## 2017-01-28 DIAGNOSIS — I70238 Atherosclerosis of native arteries of right leg with ulceration of other part of lower right leg: Secondary | ICD-10-CM

## 2017-01-28 DIAGNOSIS — M869 Osteomyelitis, unspecified: Secondary | ICD-10-CM

## 2017-01-28 HISTORY — PX: LOWER EXTREMITY INTERVENTION: CATH118252

## 2017-01-28 HISTORY — PX: LOWER EXTREMITY ANGIOGRAPHY: CATH118251

## 2017-01-28 LAB — BASIC METABOLIC PANEL
Anion gap: 6 (ref 5–15)
BUN: 13 mg/dL (ref 6–20)
CO2: 32 mmol/L (ref 22–32)
Calcium: 8.1 mg/dL — ABNORMAL LOW (ref 8.9–10.3)
Chloride: 99 mmol/L — ABNORMAL LOW (ref 101–111)
Creatinine, Ser: 0.84 mg/dL (ref 0.44–1.00)
GFR calc non Af Amer: >60 mL/min (ref >60)
GLUCOSE: 124 mg/dL — AB (ref 65–99)
POTASSIUM: 3.2 mmol/L — AB (ref 3.5–5.1)
Sodium: 137 mmol/L (ref 135–145)

## 2017-01-28 LAB — GLUCOSE, CAPILLARY
Glucose-Capillary: 107 mg/dL — ABNORMAL HIGH (ref 65–99)
Glucose-Capillary: 127 mg/dL — ABNORMAL HIGH (ref 65–99)
Glucose-Capillary: 89 mg/dL (ref 65–99)

## 2017-01-28 SURGERY — LOWER EXTREMITY ANGIOGRAPHY
Anesthesia: Moderate Sedation | Laterality: Right

## 2017-01-28 MED ORDER — IOPAMIDOL (ISOVUE-300) INJECTION 61%
INTRAVENOUS | Status: DC | PRN
  Administered 2017-01-28: 60 mL via INTRA_ARTERIAL

## 2017-01-28 MED ORDER — CHLORHEXIDINE GLUCONATE CLOTH 2 % EX PADS
6.0000 | MEDICATED_PAD | Freq: Every day | CUTANEOUS | Status: DC
  Administered 2017-01-29 – 2017-02-01 (×4): 6 via TOPICAL

## 2017-01-28 MED ORDER — FENTANYL CITRATE (PF) 100 MCG/2ML IJ SOLN
INTRAMUSCULAR | Status: DC | PRN
  Administered 2017-01-28: 50 ug via INTRAVENOUS
  Administered 2017-01-28: 25 ug via INTRAVENOUS

## 2017-01-28 MED ORDER — SODIUM CHLORIDE 0.9 % IV SOLN: 500.0000 mL | Freq: Once | INTRAVENOUS | Status: DC | PRN

## 2017-01-28 MED ORDER — PHENOL 1.4 % MT LIQD
1.0000 | OROMUCOSAL | Status: DC | PRN
  Filled 2017-01-28: qty 177

## 2017-01-28 MED ORDER — HEPARIN SODIUM (PORCINE) 1000 UNIT/ML IJ SOLN
INTRAMUSCULAR | Status: DC | PRN
  Administered 2017-01-28: 5000 [IU] via INTRAVENOUS

## 2017-01-28 MED ORDER — MIDAZOLAM HCL 2 MG/2ML IJ SOLN
INTRAMUSCULAR | Status: DC | PRN
Start: 2017-01-28 — End: 2017-01-28
  Administered 2017-01-28: 1 mg via INTRAVENOUS
  Administered 2017-01-28: 2 mg via INTRAVENOUS

## 2017-01-28 MED ORDER — CLONIDINE HCL 0.1 MG PO TABS: 0.2000 mg | ORAL_TABLET | ORAL | Status: DC | PRN

## 2017-01-28 MED ORDER — MIDAZOLAM HCL 5 MG/5ML IJ SOLN
INTRAMUSCULAR | Status: AC
  Filled 2017-01-28: qty 5

## 2017-01-28 MED ORDER — PANTOPRAZOLE SODIUM 40 MG PO TBEC
40.0000 mg | DELAYED_RELEASE_TABLET | Freq: Every day | ORAL | Status: DC
  Administered 2017-01-28 – 2017-02-01 (×4): 40 mg via ORAL
  Filled 2017-01-28 (×4): qty 1

## 2017-01-28 MED ORDER — FENTANYL CITRATE (PF) 100 MCG/2ML IJ SOLN
INTRAMUSCULAR | Status: AC
  Filled 2017-01-28: qty 2

## 2017-01-28 MED ORDER — ALUM & MAG HYDROXIDE-SIMETH 200-200-20 MG/5ML PO SUSP: 15.0000 mL | ORAL | Status: DC | PRN

## 2017-01-28 MED ORDER — LIDOCAINE HCL (PF) 1 % IJ SOLN
INTRAMUSCULAR | Status: AC
  Filled 2017-01-28: qty 30

## 2017-01-28 MED ORDER — HEPARIN SODIUM (PORCINE) 1000 UNIT/ML IJ SOLN
INTRAMUSCULAR | Status: AC
Start: 2017-01-28 — End: 2017-01-28
  Filled 2017-01-28: qty 1

## 2017-01-28 MED ORDER — GUAIFENESIN-DM 100-10 MG/5ML PO SYRP: 15.0000 mL | ORAL_SOLUTION | ORAL | Status: DC | PRN

## 2017-01-28 MED ORDER — CEFAZOLIN IN D5W 1 GM/50ML IV SOLN
1.0000 g | Freq: Once | INTRAVENOUS | Status: AC
  Administered 2017-01-28: 1 g via INTRAVENOUS

## 2017-01-28 MED ORDER — HEPARIN (PORCINE) IN NACL 2-0.9 UNIT/ML-% IJ SOLN
INTRAMUSCULAR | Status: AC
  Filled 2017-01-28: qty 1000

## 2017-01-28 SURGICAL SUPPLY — 15 items
BALLN ARMADA 2.5X60X150 (BALLOONS) ×4
BALLOON ARMADA 2.5X60X150 (BALLOONS) ×2 IMPLANT
CATH GWIRE MARINER STRGHT 4FR (CATHETERS) ×4 IMPLANT
CATH PIG 70CM (CATHETERS) ×4 IMPLANT
DEVICE PRESTO INFLATION (MISCELLANEOUS) ×4 IMPLANT
DEVICE STARCLOSE SE CLOSURE (Vascular Products) ×4 IMPLANT
DEVICE TORQUE (MISCELLANEOUS) ×4 IMPLANT
GLIDEWIRE ANGLED SS 035X260CM (WIRE) ×4 IMPLANT
NEEDLE ENTRY 21GA 7CM ECHOTIP (NEEDLE) ×4 IMPLANT
PACK ANGIOGRAPHY (CUSTOM PROCEDURE TRAY) ×4 IMPLANT
SET INTRO CAPELLA COAXIAL (SET/KITS/TRAYS/PACK) ×4 IMPLANT
SHEATH BRITE TIP 5FRX11 (SHEATH) ×4 IMPLANT
SHEATH RAABE 6FR (SHEATH) ×4 IMPLANT
WIRE G V18X300CM (WIRE) ×4 IMPLANT
WIRE J 3MM .035X145CM (WIRE) ×4 IMPLANT

## 2017-01-28 NOTE — Progress Notes (Signed)
Patient Demographics  Elizabeth [name], is a 66 y.o. female   MRN: [id number]   DOB - 1951/10/19  Admit Date - 01/22/2017    Outpatient Primary MD for the patient is Elizabeth Merles, MD  Consult requested in the Hospital by Elizabeth Leber, MD, On 01/28/2017     With History of -  Past Medical History:  Diagnosis Date  . Cancer [facility])    breast  . Carcinoma of right breast ([place]) 05/20/2014  . Diabetes mellitus without complication ([place])   . Hypertension   . Lymphedema   . PONV (postoperative nausea and vomiting)   . Psoriasis 1990  . Stroke ([place]) 2000   residual left arm weakness  . Varicose veins       Past Surgical History:  Procedure Laterality Date  . AMPUTATION TOE Right 01/25/2017   Procedure: AMPUTATION TOE   ;  Surgeon: Albertine Patricia, DPM;  Location: [facility] ORS;  Service: Podiatry;  Laterality: Right;  . BREAST EXCISIONAL BIOPSY Right 2015   positive, with radiation  . BREAST LUMPECTOMY Right 04-2014   followed by radiation,  no chemo  . BREAST REDUCTION SURGERY  1977  . CHOLECYSTECTOMY N/A 09/23/2015   Procedure: LAPAROSCOPIC CHOLECYSTECTOMY;  Surgeon: Bonner Puna, MD;  Location: [facility] ORS;  Service: General;  Laterality: N/A;  . DILATION AND CURETTAGE OF UTERUS  2010  . EYE SURGERY Left 2015   cataracts and left eye detached retina repair  . EYE SURGERY Right 2013   cataract with len implant  . GASTRIC ROUX-EN-Y N/A 09/23/2015   Procedure: LAPAROSCOPIC ROUX-EN-Y GASTRIC;  Surgeon: Bonner Puna, MD;  Location: [facility] ORS;  Service: General;  Laterality: N/A;  . HIATAL HERNIA REPAIR N/A 09/23/2015   Procedure: LAPAROSCOPIC REPAIR OF HIATAL HERNIA;  Surgeon: Bonner Puna, MD;  Location: [facility] ORS;  Service: General;  Laterality: N/A;  . IRRIGATION AND DEBRIDEMENT FOOT Right 01/25/2017   Procedure:  IRRIGATION AND DEBRIDEMENT FOOT;  Surgeon: Albertine Patricia, DPM;  Location: [facility] ORS;  Service: Podiatry;  Laterality: Right;  . REDUCTION MAMMAPLASTY      in for   Chief Complaint  Patient presents with  . Cellulitis     HPI  Elizabeth [name]  is a 66 y.o. female, Patient was admitted to the [facility] on Saturday the 17th. I was consulted on Monday, March 19 and patient was found to have severe infection to the right foot. She underwent an incision and drainage along with fifth ray amputation on the right foot on March 20. The wound was left open for packing to try to get the infection under better control. She underwent an angioplasty today with some success and opening the anterior tibial artery. Her infection has gradually progressed and improved over the last few days. She still has some necrotic tissue in the wound that needs further debridement. There is a possibility 2 of fourth metatarsal bone involvement.    Review of Systems    In addition to the HPI above,  No Fever-chills, No Headache, No changes with Vision or hearing, No problems swallowing food or Liquids, No Chest pain, Cough or Shortness of Breath, No Abdominal pain, No Nausea or Vommitting,  Bowel movements are regular, No Blood in stool or Urine, No dysuria, No new skin rashes or bruises, No new joints pains-aches,  No new weakness, tingling, numbness in any extremity, No recent weight gain or loss, No polyuria, polydypsia or polyphagia, No significant Mental Stressors.  A full 10 point Review of Systems was done, except as stated above, all other Review of Systems were negative.   Social History Social History  Substance Use Topics  . Smoking status: Never Smoker  . Smokeless tobacco: Never Used  . Alcohol use No   Family History Family History  Problem Relation Age of Onset  . Diabetes Mother   . Cancer Mother   . Breast cancer Mother 67  . Diabetes Father   . Diabetes Sister   . Diabetes Brother       Prior to Admission medications   Medication Sig Start Date End Date Taking? Authorizing Provider  albuterol (PROVENTIL HFA;VENTOLIN HFA) 108 (90 BASE) MCG/ACT inhaler Inhale 2 puffs into the lungs every 6 (six) hours as needed for wheezing or shortness of breath.   Yes Historical Provider, MD  calcium-vitamin D (OSCAL WITH D) 500-200 MG-UNIT tablet Take 2 tablets by mouth daily with breakfast. 11/23/16  Yes Lloyd Huger, MD  clopidogrel (PLAVIX) 75 MG tablet Take 75 mg by mouth. 11/06/15  Yes Historical Provider, MD  furosemide (LASIX) 20 MG tablet Take 20 mg by mouth daily.   Yes Historical Provider, MD  gabapentin (NEURONTIN) 300 MG capsule Take 300 mg by mouth 3 (three) times daily.   Yes Historical Provider, MD  letrozole (FEMARA) 2.5 MG tablet Take 1 tablet (2.5 mg total) by mouth daily. 11/23/16  Yes Lloyd Huger, MD  linagliptin (TRADJENTA) 5 MG TABS tablet Take 5 mg by mouth daily.   Yes Historical Provider, MD  lisinopril (PRINIVIL,ZESTRIL) 10 MG tablet Take 10 mg by mouth daily.   Yes Historical Provider, MD  metFORMIN (GLUCOPHAGE) 1000 MG tablet Take 1,000 mg by mouth 2 (two) times daily with a meal.   Yes Historical Provider, MD  pravastatin (PRAVACHOL) 40 MG tablet Take 40 mg by mouth daily. In afternoon   Yes Historical Provider, MD  aspirin 81 MG tablet Take 81 mg by mouth daily. Reported on 05/25/2016    Historical Provider, MD  atenolol (TENORMIN) 50 MG tablet Take 50 mg by mouth daily. Reported on 05/25/2016    Historical Provider, MD  brimonidine (ALPHAGAN) 0.2 % ophthalmic solution Place 1 drop into the left eye 2 (two) times daily. Reported on 05/25/2016    Historical Provider, MD  insulin lispro (HUMALOG) 100 UNIT/ML injection Inject into the skin 3 (three) times daily before meals. Reported on 05/25/2016    Historical Provider, MD  ranitidine (ZANTAC) 150 MG tablet Take 150 mg by mouth 2 (two) times daily. Reported on 05/25/2016    Historical Provider, MD  timolol  (BETIMOL) 0.5 % ophthalmic solution Place 1 drop into the left eye 2 (two) times daily. Reported on 05/25/2016    Historical Provider, MD    Anti-infectives    Start     Dose/Rate Route Frequency Ordered Stop   01/28/17 0715  ceFAZolin (ANCEF) IVPB 1 g/50 mL premix     1 g 100 mL/hr over 30 Minutes Intravenous  Once 01/28/17 0702 01/28/17 0847   01/28/17 0000  ceFAZolin (ANCEF) IVPB 1 g/50 mL premix  Status:  Discontinued    Comments:  Send with pt to OR   1 g 100 mL/hr over  30 Minutes Intravenous On call 01/27/17 1335 01/27/17 1549   01/28/17 0000  ceFAZolin (ANCEF) IVPB 2 g/50 mL premix     2 g 100 mL/hr over 30 Minutes Intravenous On call 01/27/17 1549 01/29/17 0000   01/27/17 1900  piperacillin-tazobactam (ZOSYN) IVPB 3.375 g     3.375 g 12.5 mL/hr over 240 Minutes Intravenous Every 8 hours 01/27/17 1630     01/26/17 0800  vancomycin (VANCOCIN) IVPB 1000 mg/200 mL premix  Status:  Discontinued     1,000 mg 200 mL/hr over 60 Minutes Intravenous Every 12 hours 01/25/17 2039 01/27/17 0957   01/24/17 1800  ceFEPIme (MAXIPIME) 2 g in dextrose 5 % 50 mL IVPB  Status:  Discontinued     2 g 100 mL/hr over 30 Minutes Intravenous Every 12 hours 01/24/17 1747 01/27/17 0957   01/24/17 1730  metroNIDAZOLE (FLAGYL) tablet 500 mg  Status:  Discontinued     500 mg Oral Every 8 hours 01/24/17 1713 01/27/17 0957   01/22/17 2000  vancomycin (VANCOCIN) IVPB 1000 mg/200 mL premix  Status:  Discontinued     1,000 mg 200 mL/hr over 60 Minutes Intravenous Every 18 hours 01/22/17 1650 01/25/17 2039   01/22/17 1800  piperacillin-tazobactam (ZOSYN) IVPB 3.375 g  Status:  Discontinued     3.375 g 12.5 mL/hr over 240 Minutes Intravenous Every 8 hours 01/22/17 1650 01/24/17 1713   01/22/17 1130  piperacillin-tazobactam (ZOSYN) IVPB 3.375 g     3.375 g 100 mL/hr over 30 Minutes Intravenous  Once 01/22/17 1123 01/22/17 1235   01/22/17 1130  vancomycin (VANCOCIN) IVPB 1000 mg/200 mL premix     1,000 mg 200  mL/hr over 60 Minutes Intravenous  Once 01/22/17 1123 01/22/17 1357      Scheduled Meds: . aspirin  81 mg Oral Daily  . atenolol  50 mg Oral Daily  . calcium-vitamin D  2 tablet Oral Q breakfast  . ceFAZolin  2 g Intravenous On Call  . Chlorhexidine Gluconate Cloth  6 each Topical Q0600  . clopidogrel  75 mg Oral Daily  . docusate sodium  100 mg Oral BID  . famotidine  20 mg Oral BID  . furosemide  20 mg Oral Daily  . gabapentin  300 mg Oral TID  . heparin  5,000 Units Subcutaneous Q8H  . insulin aspart  0-15 Units Subcutaneous TID WC  . letrozole  2.5 mg Oral Daily  . linagliptin  5 mg Oral Daily  . lisinopril  10 mg Oral Daily  . metFORMIN  1,000 mg Oral BID WC  . mupirocin ointment  1 application Nasal BID  . pantoprazole  40 mg Oral Daily  . piperacillin-tazobactam (ZOSYN)  IV  3.375 g Intravenous Q8H  . pravastatin  40 mg Oral Daily   ContinNo Known Allergies  Physical Exam  Vitals  Blood pressure (!) 153/66, pulse 64, temperature 98.9 F (37.2 C), temperature source Oral, resp. rate 16, height 5\' 3"  (1.6 m), weight 87.5 kg (193 lb), SpO2 96 %.  Lower Extremity exam: Patient's right foot and leg have started to reduce the amount of redness that she had. Still some erythema to the foot in particular. Wound itself appears to be slowly improving. Difficult to tell about the level of necrosis to the fourth metatarsal region. Some skin necrosis is noted proximally at the previous incision. Not much granulation tissue as yet but hopefully with improve circulation this can possibly improved. Data Review  CBC  Recent Labs Lab 01/22/17 1146  01/23/17 0338 01/26/17 0526  WBC 16.1* 13.5* 12.8*  HGB 10.7* 9.6* 8.3*  HCT 31.5* 28.1* 24.3*  PLT 261 210 307  MCV 83.8 85.0 83.8  MCH 28.5 29.2 28.5  MCHC 34.0 34.3 34.0  RDW 12.6 12.6 12.8  LYMPHSABS 0.6*  --   --   MONOABS 1.1*  --   --   EOSABS 0.0  --   --   BASOSABS 0.1  --   --     ------------------------------------------------------------------------------------------------------------------  Chemistries   Recent Labs Lab 01/22/17 1146 01/23/17 0338 01/24/17 0343 01/26/17 0526 01/28/17 0530  NA 132* 135 137 134* 137  K 2.9* 3.4* 4.4 3.5 3.2*  CL 95* 102 103 104 99*  CO2 29 27 26 24  32  GLUCOSE 259* 190* 151* 113* 124*  BUN 22* 18 20 18 13   CREATININE 1.02* 0.98 0.91 0.83 0.84  CALCIUM 8.5* 7.7* 8.4* 7.9* 8.1*  AST 36 32  --   --   --   ALT 24 22  --   --   --   ALKPHOS 79 70  --   --   --   BILITOT 0.6 0.6  --   --   --    -----------------------------------------------------------------------------------------------------------  Assessment & Plan: Overall the patient's foot is stabilizing regarding infection. Still has a large wound that needs to be further debrided cleaned out and fourth metatarsal base to be evaluated for possible resection if it looks like the bone has necrosis. I will plan on taking her back to the operating room tomorrow to do a deep debridement of any nonviable tissue. A likely close part of the incision but possibly at least some open for a wound VAC placement. Wound VAC may help promote further healing to the region. She had an angioplasty done today with some success to restore patency to the anterior tibial artery. This will help improve healing to the region. We'll put her on the schedule for surgery tomorrow morning.  Principal Problem:   Cellulitis Active Problems:   Diabetes mellitus, type 2 (HCC)   Morbid obesity ([place])   Hypokalemia   Osteomyelitis of right foot (HCC)   Type 2 diabetes mellitus with peripheral vascular disease ([place])   Family Communication: Plan discussed with patient .  Perry Mount M.D on 01/28/2017 at 12:45 PM

## 2017-01-28 NOTE — Progress Notes (Signed)
RN resumed care at 1500. Patient alert and oriented. No complaints of pain. Patient working with physical therapy, no needs at this time.   Deri Fuelling, RN

## 2017-01-28 NOTE — Progress Notes (Signed)
Per MD patient will not be stable for D/C this weekend. Plan is for patient to D/C to [facility]. [employer] authorization is pending. [facility] admissions coordinator at [facility] is aware of above. Clinical Social Worker (CSW) will continue to follow and assist as needed.   [employer], LCSW [phone]

## 2017-01-28 NOTE — Progress Notes (Signed)
[facility] at [place] NAME: Elizabeth [name]    MR#:  [id number]  DATE OF BIRTH:  03/09/1951  SUBJECTIVE:   s/p Angiogram today with angioplasty to the right anterior tibial artery.  No acute complaints presently.    REVIEW OF SYSTEMS:   Review of Systems  Constitutional: Negative for chills, fever and weight loss.  HENT: Negative for ear discharge, ear pain and nosebleeds.   Eyes: Negative for blurred vision, pain and discharge.  Respiratory: Negative for sputum production, shortness of breath, wheezing and stridor.   Cardiovascular: Negative for chest pain, palpitations, orthopnea and PND.  Gastrointestinal: Negative for abdominal pain, diarrhea, nausea and vomiting.  Genitourinary: Negative for frequency and urgency.  Musculoskeletal: Negative for back pain and joint pain.  Neurological: Positive for weakness. Negative for sensory change, speech change and focal weakness.  Psychiatric/Behavioral: Negative for depression and hallucinations. The patient is not nervous/anxious.    Tolerating Diet: yes Tolerating PT: pending  DRUG ALLERGIES:  No Known Allergies  VITALS:  Blood pressure (!) 153/66, pulse 64, temperature 98.9 F (37.2 C), temperature source Oral, resp. rate 16, height 5\' 3"  (1.6 m), weight 87.5 kg (193 lb), SpO2 96 %.  PHYSICAL EXAMINATION:   Physical Exam  GENERAL:  66 y.o.-year-old patient lying in the bed in no acute distress.  EYES: Pupils equal, round, reactive to light and accommodation. No scleral icterus. Extraocular muscles intact.  HEENT: Head atraumatic, normocephalic. Oropharynx and nasopharynx clear.  NECK:  Supple, no jugular venous distention. No thyroid enlargement, no tenderness.  LUNGS: Normal breath sounds bilaterally, no wheezing, rales, rhonchi. No use of accessory muscles of respiration.  CARDIOVASCULAR: S1, S2 normal. No murmurs, rubs, or gallops.  ABDOMEN: Soft, nontender, nondistended.  Bowel sounds present. No organomegaly or mass.  EXTREMITIES: No cyanosis, clubbing or edema b/l.   Right foot dressing present. NEUROLOGIC: Cranial nerves II through XII are intact. No focal Motor or sensory deficits b/l. Globally weak.   PSYCHIATRIC:  patient is alert and oriented x 3.  SKIN: No obvious rash, lesion, or ulcer.   LABORATORY PANEL:  CBC  Recent Labs Lab 01/26/17 0526  WBC 12.8*  HGB 8.3*  HCT 24.3*  PLT 307    Chemistries   Recent Labs Lab 01/23/17 0338  01/28/17 0530  NA 135  < > 137  K 3.4*  < > 3.2*  CL 102  < > 99*  CO2 27  < > 32  GLUCOSE 190*  < > 124*  BUN 18  < > 13  CREATININE 0.98  < > 0.84  CALCIUM 7.7*  < > 8.1*  AST 32  --   --   ALT 22  --   --   ALKPHOS 70  --   --   BILITOT 0.6  --   --   < > = values in this interval not displayed. Cardiac Enzymes  Recent Labs Lab 01/22/17 1146  TROPONINI 0.03*   RADIOLOGY:  No results found. ASSESSMENT AND PLAN:   Elizabeth [name] 66 y.o.femalehas a past medical history significant for breast cancer, HTN, DM ,and lymphedema now with progressive pain, swelling, and redness of RLE with fever despite po Doxycycline.   1. Right lower extremity cellulitis with swelling/edema/osteomyelitis with Peripheral arterial disease. - s/p Fifth ray amputation right foot with debridement of deep necrotic gangrenous tissue POD # 3.  - Appreciate podiatry and vascular surgery input. -Seen by infectious disease, antibiotics now down  to just IV Zosyn for now. Will likely need 4-6 weeks of treatment.  - pt. Is s/p PICC Line already.  Discussed with Podiatry and they plan on taking pt. To OR again on Sunday to see if the wound needs to be further debrided.    2. Diabetes- BS stable.  Cont. SSI for now.  - cont. Metformin, Tradjenta  3. Chronic lymphedema/suspected PAD - cont ASA + Plavix, Pravachol.  4.. Hypertension - cont. Lisinopril, Clonidine, Atenolol. BP stable.   5. History of breast  cancer-continue letrozole.  6. Hyperlipidemia-continue Pravachol.  7. GERD-continue Protonix, Pepcid.    Case discussed with Care Management/Social Worker. Management plans discussed with the patient, family and they are in agreement.  CODE STATUS: FULL  DVT Prophylaxis: lovenox  TOTAL TIME TAKING CARE OF THIS PATIENT: 30 minutes.   POSSIBLE D/C IN 2-3 DAYS, DEPENDING ON CLINICAL CONDITION.  Note: This dictation was prepared with Dragon dictation along with smaller phrase technology. Any transcriptional errors that result from this process are unintentional.  Henreitta Leber M.D on 01/28/2017 at 2:17 PM  Between 7am to 6pm - Pager - [phone]  After 6pm go to [email or web address] - password [email or web address] [facility]  Office  [phone]  CC: Primary care physician; Marguerita Merles, MD

## 2017-01-28 NOTE — Plan of Care (Signed)
Problem: Pain Management: Goal: Pain level will decrease with appropriate interventions Outcome: Progressing Patient not complaining of any pain at this time.   Deri Fuelling, RN

## 2017-01-28 NOTE — Progress Notes (Signed)
qPhysical Therapy Treatment Patient Details Name: Elizabeth [name] MRN: [id number] DOB: 09/08/1951 Today's Date: 01/28/2017    History of Present Illness 66 y/o female here with R LE cellulitis.  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    PT Comments    Pt is able to do bed mobility well and generally was safe with that.  She remains very limited with standing and any hopping mobility.  Overall pt is very unsteady and unsafe with standing activities and though she did a little better today trying to keep weight off the R LE (boot donned) she did not have enough UE strength, even with heavy assist from PT, to do more than miniscule heel-toe shuffling with L foot.  Pt to have 4th debridement tomorrow.   Follow Up Recommendations  SNF     Equipment Recommendations       Recommendations for Other Services       Precautions / Restrictions Precautions Precautions: Fall Restrictions RLE Weight Bearing: Non weight bearing Other Position/Activity Restrictions: post-op boot    Mobility  Bed Mobility Overal bed mobility: Modified Independent             General bed mobility comments: Pt again able to get her LEs safely to EOB w/o assist, scooted at EOB and able to maintain balance w/o issue.   Transfers Overall transfer level: Needs assistance Equipment used: Rolling walker (2 wheeled) Transfers: Sit to/from Stand Sit to Stand: Mod assist         General transfer comment: Pt was better able to help getting to standing today.  She needed cues for hand placement, foot placement, sequencing and proper walker use but ultimately she needed considerably less assist today versus previous session.  Ambulation/Gait             General Gait Details: Pt again struggles to do anything that could be called ambulation.  She was able to do some very minimal heel-toe shifting with considerable unwerighting assist from PT.   She was occasionally able to keep weight off R foot during these efforts and did appear to put less pressure through it when she was unable to keep it off the ground.   Stairs            Wheelchair Mobility    Modified Rankin (Stroke Patients Only)       Balance Overall balance assessment: Needs assistance   Sitting balance-Leahy Scale: Good       Standing balance-Leahy Scale: Poor Standing balance comment: heavy use of the walker and even then was not stable during anything but brief static standing.                             Cognition Arousal/Alertness: Lethargic   Overall Cognitive Status: Within Functional Limits for tasks assessed                                        Exercises General Exercises - Lower Extremity Ankle Circles/Pumps: AROM;10 reps Short Arc Quad: Strengthening;Right;10 reps Heel Slides: Strengthening;Left;10 reps Hip ABduction/ADduction: Strengthening;10 reps;Both Straight Leg Raises: AROM;Strengthening;10 reps;Both    General Comments        Pertinent Vitals/Pain Pain Score: 3     Home Living  Prior Function            PT Goals (current goals can now be found in the care plan section) Progress towards PT goals: Progressing toward goals    Frequency    7X/week      PT Plan Current plan remains appropriate    Co-evaluation             End of Session Equipment Utilized During Treatment: Gait belt Activity Tolerance: Patient limited by fatigue Patient left: with chair alarm set;with call bell/phone within reach   PT Visit Diagnosis: Muscle weakness (generalized) (M62.81);Difficulty in walking, not elsewhere classified (R26.2)     Time: 0093-8182 PT Time Calculation (min) (ACUTE ONLY): 29 min  Charges:  $Therapeutic Exercise: 8-22 mins $Therapeutic Activity: 8-22 mins                    G Codes:        Kreg Shropshire, DPT 01/28/2017, 4:05 PM

## 2017-01-28 NOTE — H&P (Signed)
[facility] History & Physical Update  The patient was interviewed and re-examined.  The patient's previous History and Physical has been reviewed and is unchanged.  There is no change in the plan of care. We plan to proceed with the scheduled procedure.  Hortencia Pilar, MD  01/28/2017, 8:09 AM

## 2017-01-28 NOTE — Progress Notes (Signed)
[facility] INFECTIOUS DISEASE PROGRESS NOTE Date of Admission:  01/22/2017     ID: Elizabeth [name] is a 66 y.o. 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[place])   Type 2 diabetes mellitus with peripheral vascular disease ([place])   Subjective: No fevers, s/p angioplasty, working with PT  ROS  Eleven systems are reviewed and negative except per hpi  Medications:  Antibiotics Given (last 72 hours)    Date/Time Action Medication Dose Rate   01/25/17 1755 Given   ceFEPIme (MAXIPIME) 2 g in dextrose 5 % 50 mL IVPB 2 g 100 mL/hr   01/25/17 2011 Given   vancomycin (VANCOCIN) IVPB 1000 mg/200 mL premix 1,000 mg 200 mL/hr   01/25/17 2012 Given   metroNIDAZOLE (FLAGYL) tablet 500 mg 500 mg    01/26/17 0558 Given   metroNIDAZOLE (FLAGYL) tablet 500 mg 500 mg    01/26/17 0558 Given   ceFEPIme (MAXIPIME) 2 g in dextrose 5 % 50 mL IVPB 2 g 100 mL/hr   01/26/17 0942 Given   vancomycin (VANCOCIN) IVPB 1000 mg/200 mL premix 1,000 mg 200 mL/hr   01/26/17 1622 Given   metroNIDAZOLE (FLAGYL) tablet 500 mg 500 mg    01/26/17 1807 Given   ceFEPIme (MAXIPIME) 2 g in dextrose 5 % 50 mL IVPB 2 g 100 mL/hr   01/26/17 1951 Given   vancomycin (VANCOCIN) IVPB 1000 mg/200 mL premix 1,000 mg 200 mL/hr   01/26/17 2230 Given   metroNIDAZOLE (FLAGYL) tablet 500 mg 500 mg    01/27/17 0636 Given   metroNIDAZOLE (FLAGYL) tablet 500 mg 500 mg    01/27/17 0636 Given   ceFEPIme (MAXIPIME) 2 g in dextrose 5 % 50 mL IVPB 2 g 100 mL/hr   01/27/17 6144 Given   vancomycin (VANCOCIN) IVPB 1000 mg/200 mL premix 1,000 mg 200 mL/hr   01/27/17 1850 Given   piperacillin-tazobactam (ZOSYN) IVPB 3.375 g 3.375 g 12.5 mL/hr   01/28/17 0503 Given   piperacillin-tazobactam (ZOSYN) IVPB 3.375 g 3.375 g 12.5 mL/hr   01/28/17 0817 Given   ceFAZolin (ANCEF) IVPB 1 g/50 mL  premix 1 g 100 mL/hr     . aspirin  81 mg Oral Daily  . atenolol  50 mg Oral Daily  . calcium-vitamin D  2 tablet Oral Q breakfast  . ceFAZolin  2 g Intravenous On Call  . Chlorhexidine Gluconate Cloth  6 each Topical Q0600  . clopidogrel  75 mg Oral Daily  . docusate sodium  100 mg Oral BID  . famotidine  20 mg Oral BID  . furosemide  20 mg Oral Daily  . gabapentin  300 mg Oral TID  . heparin  5,000 Units Subcutaneous Q8H  . insulin aspart  0-15 Units Subcutaneous TID WC  . letrozole  2.5 mg Oral Daily  . linagliptin  5 mg Oral Daily  . lisinopril  10 mg Oral Daily  . metFORMIN  1,000 mg Oral BID WC  . mupirocin ointment  1 application Nasal BID  . pantoprazole  40 mg Oral Daily  . piperacillin-tazobactam (ZOSYN)  IV  3.375 g Intravenous Q8H  . pravastatin  40 mg Oral Daily    Objective: Vital signs in last 24 hours: Temp:  [98.3 F (36.8 C)-98.9 F (37.2 C)] 98.9 F (37.2 C) (03/23 1052) Pulse Rate:  [59-65] 64 (03/23  1052) Resp:  [16-25] 16 (03/23 1030) BP: (123-154)/(52-70) 153/66 (03/23 1052) SpO2:  [92 %-100 %] 96 % (03/23 1052) Weight:  [87.5 kg (193 lb)] 87.5 kg (193 lb) (03/23 0653) Constitutional:  Chronically ill apperaing HENT: [place]/AT, PERRLA, no scleral icterus Mouth/Throat: Oropharynx is clear and moist. No oropharyngeal exudate.  Cardiovascular: Normal rate, regular rhythm and normal heart sounds. Pulmonary/Chest: Effort normal and breath sounds normal. No respiratory distress.  has no wheezes.  Neck = supple, no nuchal rigidity Abdominal: Soft. Bowel sounds are normal.  exhibits no distension. There is no tenderness.  Lymphadenopathy: no cervical adenopathy. No axillary adenopathy Neurological: alert and oriented to person, place, and time.  Ext RLE with 2+ edema Skin:  R foot wrapped post op  Psychiatric: a normal mood and affect.  behavior is normal.   Lab Results  Recent Labs  01/26/17 0526 01/28/17 0530  WBC 12.8*  --   HGB 8.3*  --   HCT  24.3*  --   NA 134* 137  K 3.5 3.2*  CL 104 99*  CO2 24 32  BUN 18 13  CREATININE 0.83 0.84   Lab Results  Component Value Date   ESRSEDRATE 126 (H) 01/24/2017    Microbiology: Results for orders placed or performed during the hospital encounter of 01/22/17  Blood Culture (routine x 2)     Status: None   Collection Time: 01/22/17 11:46 AM  Result Value Ref Range Status   Specimen Description BLOOD LEFT ASSIST CONTROL  Final   Special Requests BOTTLES DRAWN AEROBIC AND ANAEROBIC BCAV  Final   Culture NO GROWTH 5 DAYS  Final   Report Status 01/27/2017 FINAL  Final  Blood Culture (routine x 2)     Status: None   Collection Time: 01/22/17 11:46 AM  Result Value Ref Range Status   Specimen Description BLOOD RIGHT ASSIST CONTROL  Final   Special Requests BOTTLES DRAWN AEROBIC AND ANAEROBIC BCAV  Final   Culture NO GROWTH 5 DAYS  Final   Report Status 01/27/2017 FINAL  Final  MRSA PCR Screening     Status: Abnormal   Collection Time: 01/22/17  4:20 PM  Result Value Ref Range Status   MRSA by PCR POSITIVE (A) NEGATIVE Final    Comment:        The GeneXpert MRSA Assay (FDA approved for NASAL specimens only), is one component of a comprehensive MRSA colonization surveillance program. It is not intended to diagnose MRSA infection nor to guide or monitor treatment for MRSA infections. RESULT CALLED TO, READ BACK BY AND VERIFIED WITH: Talbot Grumbling @ [phone] 01/22/17 by [facility]   Urine culture     Status: None   Collection Time: 01/22/17  9:24 PM  Result Value Ref Range Status   Specimen Description URINE, RANDOM  Final   Special Requests NONE  Final   Culture   Final    NO GROWTH Performed at [facility], [address] [place], [place] [address]    Report Status 01/25/2017 FINAL  Final  Aerobic/Anaerobic Culture (surgical/deep wound)     Status: None (Preliminary result)   Collection Time: 01/24/17  5:00 PM  Result Value Ref Range Status   Specimen Description FOOT   Final   Special Requests RIGHT FOOT NO ANAEROBIC SWAB SUBMITTED  Final   Gram Stain   Final    FEW WBC PRESENT, PREDOMINANTLY PMN MODERATE GRAM POSITIVE COCCI IN PAIRS FEW GRAM POSITIVE COCCI IN CLUSTERS RARE GRAM NEGATIVE RODS RARE  GRAM POSITIVE RODS    Culture   Final    NORMAL SKIN FLORA FEW BACTEROIDES THETAIOTAOMICRON BETA LACTAMASE POSITIVE Performed at [facility], [place] [address] [place], [place] [address]    Report Status PENDING  Incomplete  C difficile quick scan w PCR reflex     Status: None   Collection Time: 01/25/17 12:00 AM  Result Value Ref Range Status   C Diff antigen NEGATIVE NEGATIVE Final   C Diff toxin NEGATIVE NEGATIVE Final   C Diff interpretation No C. difficile detected.  Final  Aerobic/Anaerobic Culture (surgical/deep wound)     Status: None (Preliminary result)   Collection Time: 01/25/17 12:35 PM  Result Value Ref Range Status   Specimen Description TISSUE  Final   Special Requests RIGHT FIFTH TOE  Final   Gram Stain   Final    RARE WBC PRESENT,BOTH PMN AND MONONUCLEAR NO ORGANISMS SEEN    Culture   Final    NO GROWTH 2 DAYS NO ANAEROBES ISOLATED; CULTURE IN PROGRESS FOR 5 DAYS Performed at [facility], [place] [address], [place], [place] [address]    Report Status PENDING  Incomplete    Studies/Results: No results found.  Assessment/Plan: ROE KOFFMAN is a 66 y.o. female with DM, B CA admitted with celluliits, necrotizing infection and osteomyelitis of R foot . Dr Elvina Mattes performed on 3/20 fifth ray amputation and debridement of deep necrotic tissue, concern also for 4th ray involvment. ESR is 126, crp 28, cx  With normal skin flora,  S/p angioplasty 3/22 Dr Elvina Mattes to perform repeat debridements  Recommendations Consolidated to just zosyn Will need likely 4-6 weeks IV therapy  - see IV abx order sheet Thank you very much for the consult. Will follow with you.  Marion Seese P   01/28/2017, 3:08 PM

## 2017-01-28 NOTE — Anesthesia Preprocedure Evaluation (Addendum)
Anesthesia Evaluation  Patient identified by MRN, date of birth, ID band Patient awake    Reviewed: Allergy & Precautions, NPO status , Patient's Chart, lab work & pertinent test results  History of Anesthesia Complications (+) PONV and history of anesthetic complications  Airway Mallampati: II  TM Distance: >3 FB Neck ROM: Full    Dental  (+) Poor Dentition   Pulmonary asthma , neg sleep apnea,    - rhonchi (-) wheezing      Cardiovascular hypertension, Pt. on medications and Pt. on home beta blockers + Peripheral Vascular Disease  (-) CAD and (-) Past MI  - Systolic murmurs and - Diastolic murmurs    Neuro/Psych PSYCHIATRIC DISORDERS Depression CVA (R sided weakness), Residual Symptoms    GI/Hepatic Neg liver ROS, GERD  Medicated,  Endo/Other  diabetes, Type 2, Oral Hypoglycemic Agents, Insulin Dependent  Renal/GU negative Renal ROS     Musculoskeletal negative musculoskeletal ROS (+)   Abdominal (+) + obese,   Peds  Hematology negative hematology ROS (+)   Anesthesia Other Findings    Reproductive/Obstetrics                            Anesthesia Physical  Anesthesia Plan  ASA: III  Anesthesia Plan: General   Post-op Pain Management:    Induction: Intravenous  Airway Management Planned: LMA  Additional Equipment:   Intra-op Plan:   Post-operative Plan:   Informed Consent: I have reviewed the patients History and Physical, chart, labs and discussed the procedure including the risks, benefits and alternatives for the proposed anesthesia with the patient or authorized representative who has indicated his/her understanding and acceptance.     Plan Discussed with: CRNA and Anesthesiologist  Anesthesia Plan Comments:        Anesthesia Quick Evaluation

## 2017-01-28 NOTE — Op Note (Signed)
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 Introduction catheter into right lower extremity 3rd order catheter placement  2. Contrast injection right lower extremity for distal runoff  3. Percutaneous transluminal angioplasty of the right anterior tibial artery to 2.5 mm 4. Star close closure left common femoral arteriotomy     Anesthesia: Conscious sedation was administered under my direct supervision. IV Versed plus fentanyl were utilized. Continuous ECG, pulse oximetry and blood pressure was monitored throughout the entire procedure.  Conscious sedation was for a total of 1 hour 17 minutes.  Sheath: 6 French Rabi  Contrast: 60 cc  Fluoroscopy Time: 5.3 minutes  Indications: Elizabeth [name] presents with increasing pain of the right lower extremity. 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 This suggests the patient is having limb threatening ischemia. The risks and benefits are reviewed all questions answered patient agrees to proceed.  Procedure:Elizabeth [name] is a 66 y.o. y.o. female who was identified and appropriate procedural time out was performed. The patient was then placed supine on the table and prepped and draped in the usual sterile fashion.   Ultrasound was placed in the sterile sleeve and the left groin was evaluated the left common femoral artery was echolucent and pulsatile indicating patency. Image was recorded for the permanent record and under real-time visualization a microneedle was inserted into the common femoral artery microwire followed by a micro-sheath. A J-wire was then  advanced through the micro-sheath and a 5 [place] sheath was then inserted over a J-wire. J-wire was then advanced and a 5 French pigtail catheter was positioned at the level of T12.  AP projection of the aorta was then obtained. Pigtail catheter was repositioned to above the bifurcation and a LAO view of the pelvis was obtained. Subsequently a pigtail catheter with the stiff angle Glidewire was used to cross the aortic bifurcation the catheter wire were advanced down into the right distal external iliac artery. Oblique view of the femoral bifurcation was then obtained and subsequently the wire was reintroduced and the pigtail catheter negotiated into the SFA representing third order catheter placement. Distal runoff was then performed.  5000 units of heparin was then given and allowed to circulate and a 6 [place] Rabi sheath was advanced up and over the bifurcation and positioned in the femoral artery  Straight catheter and stiff angle Glidewire were then negotiated down into the distal popliteal. Catheter was then advanced. Hand injection contrast demonstrated the tibial anatomy in detail.  The angle Glidewire and catheter were then used to select the anterior tibial. Catheter was then negotiated down to the level of the ankle where magnified imaging of the distal anterior tibial and dorsalis pedis as well as the pedal arch was performed.  V-18 wire was then advanced through the catheter catheter was removed and a  2.5 mm x 60 mm all traverse balloon was used to angioplasty the anterior tibial in 2 locations. Each inflation was for 2 minutes at 12 atm. Follow-up imaging demonstrated excellent patency with preservation of the distal runoff.  After review of these images the sheath is pulled into the left external iliac oblique of the common femoral is obtained and a Star close device deployed. There no immediate Complications.  Findings: The abdominal aorta is opacified  with a bolus injection contrast.  Renal arteries are widely patent. The aorta itself has diffuse disease but no hemodynamically significant lesions. The common and external iliac arteries are widely patent bilaterally.  The right common femoral is widely patent as is the profunda femoris.  The SFA and popliteal demonstrates diffuse atherosclerotic changes but there are no hemodynamically significant lesions. The anterior tibial is patent at its origin and then demonstrates a string sign several centimeters from its origin and a second 80% stenosis at the level of the malleolus. The tibioperoneal trunk is patent as is the peroneal. The posterior tibial demonstrates multiple greater than 90% stenoses and then occludes in its distal half lateral plantar vessels are reconstituted. The dorsalis pedis fills the pedal arch   Following angioplasty anterior tibial  tibial now is in-line flow and looks quite nice with less than 5% residual stenosis at the 2 locations that were treated.   Summary: Successful recanalization right lower extremity for limb salvage   Disposition: Patient was taken to the recovery room in stable condition having tolerated the procedure well.  Elizabeth [name] 01/28/2017,9:35 AM

## 2017-01-29 ENCOUNTER — Encounter: Payer: Self-pay | Admitting: Podiatry

## 2017-01-29 ENCOUNTER — Inpatient Hospital Stay: Payer: Medicare HMO | Admitting: Anesthesiology

## 2017-01-29 ENCOUNTER — Encounter
Admission: EM | Disposition: A | Discharge status: Skilled Nursing Facility | Payer: Self-pay | Source: Home / Self Care | Attending: Specialist

## 2017-01-29 DIAGNOSIS — I70238 Atherosclerosis of native arteries of right leg with ulceration of other part of lower right leg: Secondary | ICD-10-CM

## 2017-01-29 HISTORY — PX: IRRIGATION AND DEBRIDEMENT FOOT: SHX6602

## 2017-01-29 LAB — GLUCOSE, CAPILLARY
GLUCOSE-CAPILLARY: 116 mg/dL — AB (ref 65–99)
GLUCOSE-CAPILLARY: 113 mg/dL — AB (ref 65–99)
GLUCOSE-CAPILLARY: 103 mg/dL — AB (ref 65–99)
Glucose-Capillary: 108 mg/dL — ABNORMAL HIGH (ref 65–99)
Glucose-Capillary: 128 mg/dL — ABNORMAL HIGH (ref 65–99)

## 2017-01-29 SURGERY — IRRIGATION AND DEBRIDEMENT FOOT
Anesthesia: General | Laterality: Right | Wound class: Dirty or Infected

## 2017-01-29 MED ORDER — FENTANYL CITRATE (PF) 100 MCG/2ML IJ SOLN
INTRAMUSCULAR | Status: AC
  Filled 2017-01-29: qty 2

## 2017-01-29 MED ORDER — LIDOCAINE HCL (PF) 1 % IJ SOLN
INTRAMUSCULAR | Status: AC
  Filled 2017-01-29: qty 30

## 2017-01-29 MED ORDER — FENTANYL CITRATE (PF) 100 MCG/2ML IJ SOLN: 25.0000 ug | INTRAMUSCULAR | Status: DC | PRN

## 2017-01-29 MED ORDER — PROPOFOL 10 MG/ML IV BOLUS
INTRAVENOUS | Status: DC | PRN
  Administered 2017-01-29: 30 mg via INTRAVENOUS

## 2017-01-29 MED ORDER — BUPIVACAINE HCL (PF) 0.5 % IJ SOLN
INTRAMUSCULAR | Status: AC
  Filled 2017-01-29: qty 30

## 2017-01-29 MED ORDER — FENTANYL CITRATE (PF) 100 MCG/2ML IJ SOLN
INTRAMUSCULAR | Status: DC | PRN
  Administered 2017-01-29 (×4): 25 ug via INTRAVENOUS

## 2017-01-29 MED ORDER — LIDOCAINE HCL 1 % IJ SOLN
INTRAMUSCULAR | Status: DC | PRN
  Administered 2017-01-29: 10 mL

## 2017-01-29 MED ORDER — ONDANSETRON HCL 4 MG/2ML IJ SOLN
INTRAMUSCULAR | Status: DC | PRN
  Administered 2017-01-29: 4 mg via INTRAVENOUS

## 2017-01-29 MED ORDER — VANCOMYCIN HCL 1000 MG IV SOLR
INTRAVENOUS | Status: AC
  Filled 2017-01-29: qty 1000

## 2017-01-29 MED ORDER — LIDOCAINE HCL (CARDIAC) 20 MG/ML IV SOLN
INTRAVENOUS | Status: DC | PRN
  Administered 2017-01-29: 100 mg via INTRAVENOUS

## 2017-01-29 MED ORDER — ONDANSETRON HCL 4 MG/2ML IJ SOLN: 4.0000 mg | Freq: Once | INTRAMUSCULAR | Status: DC | PRN

## 2017-01-29 MED ORDER — SEVOFLURANE IN SOLN
RESPIRATORY_TRACT | Status: AC
  Filled 2017-01-29: qty 250

## 2017-01-29 MED ORDER — GENTAMICIN SULFATE 40 MG/ML IJ SOLN
INTRAMUSCULAR | Status: AC
  Filled 2017-01-29: qty 6

## 2017-01-29 MED ORDER — PROPOFOL 10 MG/ML IV BOLUS
INTRAVENOUS | Status: AC
  Filled 2017-01-29: qty 20

## 2017-01-29 MED ORDER — SODIUM CHLORIDE 0.9 % IJ SOLN
INTRAMUSCULAR | Status: AC
  Filled 2017-01-29: qty 10

## 2017-01-29 MED ORDER — PROPOFOL 500 MG/50ML IV EMUL
INTRAVENOUS | Status: DC | PRN
  Administered 2017-01-29: 150 ug/kg/min via INTRAVENOUS

## 2017-01-29 MED ORDER — EPHEDRINE SULFATE 50 MG/ML IJ SOLN
INTRAMUSCULAR | Status: DC | PRN
  Administered 2017-01-29: 5 mg via INTRAVENOUS

## 2017-01-29 SURGICAL SUPPLY — 48 items
BAG COUNTER SPONGE EZ (MISCELLANEOUS) IMPLANT
BANDAGE ELASTIC 3 LF NS (GAUZE/BANDAGES/DRESSINGS) IMPLANT
BANDAGE ELASTIC 4 LF NS (GAUZE/BANDAGES/DRESSINGS) ×3 IMPLANT
BANDAGE STRETCH 3X4.1 STRL (GAUZE/BANDAGES/DRESSINGS) ×3 IMPLANT
BNDG ESMARK 4X12 TAN STRL LF (GAUZE/BANDAGES/DRESSINGS) ×3 IMPLANT
BNDG GAUZE 4.5X4.1 6PLY STRL (MISCELLANEOUS) ×3 IMPLANT
CANISTER SUCT 1200ML W/VALVE (MISCELLANEOUS) ×3 IMPLANT
CANISTER SUCT 3000ML (MISCELLANEOUS) ×3 IMPLANT
CANISTER SUCT 3000ML PPV (MISCELLANEOUS) ×3 IMPLANT
COUNTER SPONGE BAG EZ (MISCELLANEOUS)
CUFF TOURN 18 STER (MISCELLANEOUS) ×3 IMPLANT
CUFF TOURN DUAL PL 12 NO SLV (MISCELLANEOUS) IMPLANT
DRAPE FLUOR MINI C-ARM 54X84 (DRAPES) IMPLANT
DRSG MEPITEL 4X7.2 (GAUZE/BANDAGES/DRESSINGS) ×3 IMPLANT
DRSG VAC ATS MED SENSATRAC (GAUZE/BANDAGES/DRESSINGS) ×3 IMPLANT
DURAPREP 26ML APPLICATOR (WOUND CARE) ×3 IMPLANT
ELECT REM PT RETURN 9FT ADLT (ELECTROSURGICAL) ×3
ELECTRODE REM PT RTRN 9FT ADLT (ELECTROSURGICAL) ×1 IMPLANT
GAUZE PETRO XEROFOAM 1X8 (MISCELLANEOUS) IMPLANT
GAUZE SPONGE 4X4 12PLY STRL (GAUZE/BANDAGES/DRESSINGS) ×3 IMPLANT
GLOVE BIO SURGEON STRL SZ8 (GLOVE) ×9 IMPLANT
GLOVE INDICATOR 8.0 STRL GRN (GLOVE) ×3 IMPLANT
GOWN STRL REUS W/ TWL LRG LVL3 (GOWN DISPOSABLE) ×2 IMPLANT
GOWN STRL REUS W/TWL LRG LVL3 (GOWN DISPOSABLE) ×4
HANDPIECE VERSAJET DEBRIDEMENT (MISCELLANEOUS) ×3 IMPLANT
IV SOD CHL 0.9% 1000ML (IV SOLUTION) ×3 IMPLANT
KIT RM TURNOVER STRD PROC AR (KITS) ×3 IMPLANT
KIT STIMULAN RAPID CURE 5CC (Orthopedic Implant) ×3 IMPLANT
LABEL OR SOLS (LABEL) ×3 IMPLANT
NEEDLE FILTER BLUNT 18X 1/2SAF (NEEDLE) ×2
NEEDLE FILTER BLUNT 18X1 1/2 (NEEDLE) ×1 IMPLANT
NEEDLE HYPO 25X1 1.5 SAFETY (NEEDLE) ×3 IMPLANT
NS IRRIG 500ML POUR BTL (IV SOLUTION) ×3 IMPLANT
PACK EXTREMITY ARMC (MISCELLANEOUS) ×3 IMPLANT
SET WARMING FLUID IRRIGATION (MISCELLANEOUS) IMPLANT
STOCKINETTE STRL 6IN 960660 (GAUZE/BANDAGES/DRESSINGS) ×3 IMPLANT
SUT ETHILON 3-0 FS-10 30 BLK (SUTURE) ×3
SUT ETHILON 4-0 (SUTURE)
SUT ETHILON 4-0 FS2 18XMFL BLK (SUTURE)
SUT VIC AB 3-0 SH 27 (SUTURE)
SUT VIC AB 3-0 SH 27X BRD (SUTURE) IMPLANT
SUT VIC AB 4-0 FS2 27 (SUTURE) IMPLANT
SUTURE EHLN 3-0 FS-10 30 BLK (SUTURE) ×1 IMPLANT
SUTURE ETHLN 4-0 FS2 18XMF BLK (SUTURE) IMPLANT
SWAB DUAL CULTURE TRANS RED ST (MISCELLANEOUS) ×3 IMPLANT
SYR 3ML LL SCALE MARK (SYRINGE) IMPLANT
SYRINGE 10CC LL (SYRINGE) ×3 IMPLANT
WND VAC CANISTER 500ML (MISCELLANEOUS) ×3 IMPLANT

## 2017-01-29 NOTE — Progress Notes (Signed)
PT Cancellation Note  Patient Details Name: Elizabeth [name] MRN: [id number] DOB: [birth year]   Cancelled Treatment:    Reason Eval/Treat Not Completed: Patient at procedure or test/unavailable   Pt in OR for procedure.  Will continue as appropriate.    Chesley Noon 01/29/2017, 10:29 AM

## 2017-01-29 NOTE — Progress Notes (Signed)
[facility]  Daily Progress Note   Subjective: POD #1: Introduction catheter into right lower extremity 3rd order catheter placement, contrast injection right lower extremity for distal runoff, percutaneous transluminal angioplasty of the right anterior tibial artery to 2.5 mm, with Star close closure left common femoral arteriotomy.  Patient without complaint. Laying in bed talking on phone. Underwent debridement today by podiatry.   Objective: Vitals:   01/29/17 0937 01/29/17 0945 01/29/17 1026 01/29/17 1416  BP: 136/68  (!) 132/51 (!) 116/50  Pulse: 62 60 64 72  Resp: 18 18    Temp:  97.7 F (36.5 C) 98.2 F (36.8 C) 99 F (37.2 C)  TempSrc:   Oral Oral  SpO2: 94% 96% 94% 95%  Weight:      Height:        Intake/Output Summary (Last 24 hours) at 01/29/17 1542 Last data filed at 01/29/17 0947  Gross per 24 hour  Intake              150 ml  Output                0 ml  Net              150 ml   Physical Exam: A&Ox3, NAD CV: RRR Pulmonary: CTA Bilaterally Abdomen: Soft, Nontender, Nondistended Groin: Left dressing intact, clean no drainage noted.  Vascular:   Right Lower Extremity. VAC intact. Foot warm.    Laboratory: CBC    Component Value Date/Time   WBC 12.8 (H) 01/26/2017 0526   HGB 8.3 (L) 01/26/2017 0526   HGB 12.8 11/18/2014 1011   HCT 24.3 (L) 01/26/2017 0526   HCT 38.8 11/18/2014 1011   PLT 307 01/26/2017 0526   PLT 264 11/18/2014 1011   BMET    Component Value Date/Time   NA 137 01/28/2017 0530   NA 139 11/18/2014 1011   K 3.2 (L) 01/28/2017 0530   K 3.7 11/18/2014 1011   CL 99 (L) 01/28/2017 0530   CL 100 11/18/2014 1011   CO2 32 01/28/2017 0530   CO2 35 (H) 11/18/2014 1011   GLUCOSE 124 (H) 01/28/2017 0530   GLUCOSE 70 11/18/2014 1011   BUN 13 01/28/2017 0530   BUN 22 (H) 11/18/2014 1011   CREATININE 0.84 01/28/2017 0530   CREATININE 0.88 11/18/2014 1011   CALCIUM 8.1 (L) 01/28/2017 0530   CALCIUM 9.0 11/18/2014  1011   GFRNONAA >60 01/28/2017 0530   GFRNONAA >60 11/18/2014 1011   GFRNONAA >60 01/04/2014 1825   GFRAA >60 01/28/2017 0530   GFRAA >60 11/18/2014 1011   GFRAA >60 01/04/2014 1825   Assessment/Planning: 66 year old femaly POD #1: right lower extremity angiogram with intervention to revascularize the extremity - Stable 1) Doing well post intervention 2) Patient to follow up in our office s/p discharge.  Marcelle Overlie PA-C 01/29/2017 3:42 PM

## 2017-01-29 NOTE — Progress Notes (Signed)
[facility] NAME: Elizabeth [name]    MR#:  [id number]  DATE OF BIRTH:  12-09-1950  SUBJECTIVE:   s/p  Angiogram yesterday and debridement of right foot wound w/ antibiotic beads placement and wound vac placement today.  NO complaints presently. No events overnight.   REVIEW OF SYSTEMS:   Review of Systems  Constitutional: Negative for chills, fever and weight loss.  HENT: Negative for ear discharge, ear pain and nosebleeds.   Eyes: Negative for blurred vision, pain and discharge.  Respiratory: Negative for sputum production, shortness of breath, wheezing and stridor.   Cardiovascular: Negative for chest pain, palpitations, orthopnea and PND.  Gastrointestinal: Negative for abdominal pain, diarrhea, nausea and vomiting.  Genitourinary: Negative for frequency and urgency.  Musculoskeletal: Negative for back pain and joint pain.  Neurological: Positive for weakness. Negative for sensory change, speech change and focal weakness.  Psychiatric/Behavioral: Negative for depression and hallucinations. The patient is not nervous/anxious.    Tolerating Diet: yes Tolerating PT: pending  DRUG ALLERGIES:  No Known Allergies  VITALS:  Blood pressure (!) 132/51, pulse 64, temperature 98.2 F (36.8 C), temperature source Oral, resp. rate 18, height 5\' 3"  (1.6 m), weight 87.5 kg (193 lb), SpO2 94 %.  PHYSICAL EXAMINATION:   Physical Exam  GENERAL:  66 y.o.-year-old patient lying in the bed in no acute distress.  EYES: Pupils equal, round, reactive to light and accommodation. No scleral icterus. Extraocular muscles intact.  HEENT: Head atraumatic, normocephalic. Oropharynx and nasopharynx clear.  NECK:  Supple, no jugular venous distention. No thyroid enlargement, no tenderness.  LUNGS: Normal breath sounds bilaterally, no wheezing, rales, rhonchi. No use of accessory muscles of respiration.  CARDIOVASCULAR: S1, S2 normal. No murmurs,  rubs, or gallops.  ABDOMEN: Soft, nontender, nondistended. Bowel sounds present. No organomegaly or mass.  EXTREMITIES: No cyanosis, clubbing or edema b/l.   Right foot dressing with wound vac in place.   NEUROLOGIC: Cranial nerves II through XII are intact. No focal Motor or sensory deficits b/l. Globally weak.   PSYCHIATRIC:  patient is alert and oriented x 3.  SKIN: No obvious rash, lesion, or ulcer.   LABORATORY PANEL:  CBC  Recent Labs Lab 01/26/17 0526  WBC 12.8*  HGB 8.3*  HCT 24.3*  PLT 307    Chemistries   Recent Labs Lab 01/23/17 0338  01/28/17 0530  NA 135  < > 137  K 3.4*  < > 3.2*  CL 102  < > 99*  CO2 27  < > 32  GLUCOSE 190*  < > 124*  BUN 18  < > 13  CREATININE 0.98  < > 0.84  CALCIUM 7.7*  < > 8.1*  AST 32  --   --   ALT 22  --   --   ALKPHOS 70  --   --   BILITOT 0.6  --   --   < > = values in this interval not displayed. Cardiac Enzymes No results for input(s): TROPONINI in the last 168 hours. RADIOLOGY:  No results found. ASSESSMENT AND PLAN:   Elizabeth [name] a 66 y.o.femalehas a past medical history significant for breast cancer, HTN, DM ,and lymphedema now with progressive pain, swelling, and redness of RLE with fever despite po Doxycycline.   1. Right lower extremity cellulitis with swelling/edema/osteomyelitis with Peripheral arterial disease. - s/p Fifth ray amputation right foot with debridement of deep necrotic gangrenous tissue POD # 4.  -  Appreciate podiatry and vascular surgery input. -Seen by infectious disease, antibiotics narrowed down to just IV Zosyn for now. Will likely need 4-6 weeks of treatment.  - pt. Is s/p PICC Line already.  - s/p further debridement and antibiotic beads and wound vac placement today.    2. Diabetes- BS stable.  Cont. SSI for now.  - cont. Metformin, Tradjenta  3. Chronic lymphedema/suspected PAD - cont ASA + Plavix, Pravachol.  4.. Hypertension - cont. Lisinopril, Clonidine, Atenolol. BP  stable.   5. History of breast cancer-continue letrozole.  6. Hyperlipidemia-continue Pravachol.  7. GERD-continue Protonix, Pepcid.    Case discussed with Care Management/Social Worker. Management plans discussed with the patient, family and they are in agreement.  CODE STATUS: FULL  DVT Prophylaxis: lovenox  TOTAL TIME TAKING CARE OF THIS PATIENT: 25 minutes.   POSSIBLE D/C IN 2-3 DAYS, DEPENDING ON CLINICAL CONDITION.  Note: This dictation was prepared with Dragon dictation along with smaller phrase technology. Any transcriptional errors that result from this process are unintentional.  Henreitta Leber M.D on 01/29/2017 at 1:17 PM  Between 7am to 6pm - Pager - [phone]  After 6pm go to [email or web address] - password [email or web address] [facility]  Office  [phone]  CC: Primary care physician; Marguerita Merles, MD

## 2017-01-29 NOTE — Transfer of Care (Signed)
Immediate Anesthesia Transfer of Care Note  Patient: Elizabeth [name]  Procedure(s) Performed: Procedure(s): IRRIGATION AND DEBRIDEMENT FOOT and application of wound vac (Right)  Patient Location: PACU  Anesthesia Type:General  Level of Consciousness: sedated  Airway & Oxygen Therapy: Patient connected to face mask oxygen  Post-op Assessment: Post -op Vital signs reviewed and stable  Post vital signs: stable  Last Vitals:  Vitals:   01/28/17 2354 01/29/17 0907  BP: (!) 142/62 113/60  Pulse: 66 65  Resp: 18   Temp: 37.5 C 36.5 C    Last Pain:  Vitals:   01/29/17 0907  TempSrc: Temporal  PainSc:       Patients Stated Pain Goal: 4 (92/42/68 [id number])  Complications: No apparent anesthesia complications

## 2017-01-29 NOTE — Anesthesia Postprocedure Evaluation (Signed)
Anesthesia Post Note  Patient: Elizabeth [name]  Procedure(s) Performed: Procedure(s) (LRB): IRRIGATION AND DEBRIDEMENT FOOT and application of wound vac (Right)  Patient location during evaluation: PACU Anesthesia Type: General Level of consciousness: awake and alert Pain management: pain level controlled Vital Signs Assessment: post-procedure vital signs reviewed and stable Respiratory status: spontaneous breathing and respiratory function stable Cardiovascular status: stable Anesthetic complications: no     Last Vitals:  Vitals:   01/28/17 2354 01/29/17 0907  BP: (!) 142/62 113/60  Pulse: 66 65  Resp: 18 (!) 23  Temp: 37.5 C 36.5 C    Last Pain:  Vitals:   01/29/17 0907  TempSrc: Temporal  PainSc:                  KEPHART,WILLIAM K

## 2017-01-29 NOTE — Anesthesia Post-op Follow-up Note (Cosign Needed)
Anesthesia QCDR form completed.        

## 2017-01-29 NOTE — Op Note (Signed)
Operative note   Surgeon: Dr. Albertine Patricia, DPM.    Assistant: None    Preop diagnosis: Infected nonhealing wound right foot    Postop diagnosis: Same    Procedure:   1. 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 The fifth ray amputation was achieved about 4 days ago in order to stem the infection. Infection has stabilized although she still somewhat inflamed. Skin is slow to heal. Patient needs further debridement cleanup along with antibiotic beads and a wound VAC.    Procedure:  Patient was brought into the OR and placed on the operating table in thesupine position. After anesthesia was obtained theright lower extremity was prepped and draped in usual sterile fashion.  Operative Report: At this time attention was directed to the right foot laterally where a 8 cm incision was present. This was residual from fifth ray amputation and open packing to get wound under better control. Patient also underwent an angioplasty yesterday. Some necrotic devitalized tissue was still present in the wound and needed to be debrided and remove all potential infected tissue from areas well. This time previous sutures were removed and a wound VAC was used to freshen up skin edges along the entire incision margin. The wound is probed bluntly there was a pocket of tracking proximally but not much purulence was coming out of this. This area been cleaned up previously. The fifth metatarsal resection area appeared to be clean. There was some questionable tissue over the  lateral joint capsule of the fourth metatarsal head and this was debrided with the versa jet was set at a level IV. Other questionable nonviable tissues were removed this included only skin and skin structures but subcutaneous fat tendons and muscle. The damaged capsule tissue over the fourth metatarsal head area was removed. A slightly exposed area of the metatarsal head was identified but appeared to be stable with no demineralization or degenerative changes. After versa jet debridement to remove all problematic tissue the area was copiously irrigated with normal saline and the incision was closed distally and proximally with 3-0 nylon simple interrupted sutures. The original ulcer area was debrided and cleaned with freshen the skin edges as well and sutured with 3-0 nylon simple interrupted sutures. The central portion of the incision was left open and the proximal distal areas were packed with gentamicin and vancomycin beads and then a wound VAC was placed over this region and set it 125 mm continuous. There was then padded with 4 x 4's and wrapped with Kerlix and conform.    Patient tolerated the procedure and anesthesia well.  Was transported from the OR to the PACU with all vital signs stable and vascular status intact. To be discharged per routine protocol.  Will follow up in approximately 1 week in the outpatient clinic.

## 2017-01-29 NOTE — H&P (Signed)
H and P has been reviewed and no changes are noted.  

## 2017-01-30 LAB — GLUCOSE, CAPILLARY
GLUCOSE-CAPILLARY: 133 mg/dL — AB (ref 65–99)
Glucose-Capillary: 105 mg/dL — ABNORMAL HIGH (ref 65–99)
Glucose-Capillary: 77 mg/dL (ref 65–99)
Glucose-Capillary: 95 mg/dL (ref 65–99)

## 2017-01-30 NOTE — Progress Notes (Signed)
PT Cancellation Note  Patient Details Name: Elizabeth [name] MRN: [id number] DOB: December 08, 1950   Cancelled Treatment:    Reason Eval/Treat Not Completed: Medical issues which prohibited therapy. Upon chart review, patient s/p further debridement and antibiotic beads and wound vac placement yesterday. Currently has orders of bed rest, secondary to femoral catheter. Will complete PT orders at this time. Re-evaluation may be necessary if/when PT is deemed appropriate.   Dorice Lamas, PT, DPT 01/30/2017, 8:13 AM

## 2017-01-30 NOTE — Progress Notes (Signed)
Patient Demographics  Elizabeth [name], is a 66 y.o. female   MRN: [id number]   DOB - Aug 05, 1951  Admit Date - 01/22/2017    Outpatient Primary MD for the patient is Marguerita Merles, MD  Consult requested in the Hospital by Henreitta Leber, MD, On 01/30/2017     With History of -  Past Medical History:  Diagnosis Date  . Cancer [facility])    breast  . Carcinoma of right breast ([place]) 05/20/2014  . Diabetes mellitus without complication ([place])   . Hypertension   . Lymphedema   . PONV (postoperative nausea and vomiting)   . Psoriasis 1990  . Stroke ([place]) 2000   residual left arm weakness  . Varicose veins       Past Surgical History:  Procedure Laterality Date  . AMPUTATION TOE Right 01/25/2017   Procedure: AMPUTATION TOE   ;  Surgeon: Albertine Patricia, DPM;  Location: [facility] ORS;  Service: Podiatry;  Laterality: Right;  . BREAST EXCISIONAL BIOPSY Right 2015   positive, with radiation  . BREAST LUMPECTOMY Right 04-2014   followed by radiation,  no chemo  . BREAST REDUCTION SURGERY  1977  . CHOLECYSTECTOMY N/A 09/23/2015   Procedure: LAPAROSCOPIC CHOLECYSTECTOMY;  Surgeon: Bonner Puna, MD;  Location: [facility] ORS;  Service: General;  Laterality: N/A;  . DILATION AND CURETTAGE OF UTERUS  2010  . EYE SURGERY Left 2015   cataracts and left eye detached retina repair  . EYE SURGERY Right 2013   cataract with len implant  . GASTRIC ROUX-EN-Y N/A 09/23/2015   Procedure: LAPAROSCOPIC ROUX-EN-Y GASTRIC;  Surgeon: Bonner Puna, MD;  Location: [facility] ORS;  Service: General;  Laterality: N/A;  . HIATAL HERNIA REPAIR N/A 09/23/2015   Procedure: LAPAROSCOPIC REPAIR OF HIATAL HERNIA;  Surgeon: Bonner Puna, MD;  Location: [facility] ORS;  Service: General;  Laterality: N/A;  . IRRIGATION AND DEBRIDEMENT FOOT Right 01/25/2017   Procedure:  IRRIGATION AND DEBRIDEMENT FOOT;  Surgeon: Albertine Patricia, DPM;  Location: [facility] ORS;  Service: Podiatry;  Laterality: Right;  . REDUCTION MAMMAPLASTY      in for   Chief Complaint  Patient presents with  . Cellulitis     HPI  Elizabeth [name]  is a 66 y.o. female, She is about 1 day status post delayed primary closure and cleanup of infected abscess right foot. She's doing well. Wound VAC is intact and stable to this point. Wound VAC was not working however when I went in to see her due to the cord being partially unplugged.   Social History Social History  Substance Use Topics  . Smoking status: Never Smoker  . Smokeless tobacco: Never Used  . Alcohol use No    Family History Family History  Problem Relation Age of Onset  . Diabetes Mother   . Cancer Mother   . Breast cancer Mother 63  . Diabetes Father   . Diabetes Sister   . Diabetes Brother      Prior to Admission medications   Medication Sig Start Date End Date Taking? Authorizing Provider  albuterol (PROVENTIL HFA;VENTOLIN HFA) 108 (90 BASE) MCG/ACT inhaler Inhale 2 puffs into the lungs every 6 (six) hours  as needed for wheezing or shortness of breath.   Yes Historical Provider, MD  calcium-vitamin D (OSCAL WITH D) 500-200 MG-UNIT tablet Take 2 tablets by mouth daily with breakfast. 11/23/16  Yes Lloyd Huger, MD  clopidogrel (PLAVIX) 75 MG tablet Take 75 mg by mouth. 11/06/15  Yes Historical Provider, MD  furosemide (LASIX) 20 MG tablet Take 20 mg by mouth daily.   Yes Historical Provider, MD  gabapentin (NEURONTIN) 300 MG capsule Take 300 mg by mouth 3 (three) times daily.   Yes Historical Provider, MD  letrozole (FEMARA) 2.5 MG tablet Take 1 tablet (2.5 mg total) by mouth daily. 11/23/16  Yes Lloyd Huger, MD  linagliptin (TRADJENTA) 5 MG TABS tablet Take 5 mg by mouth daily.   Yes Historical Provider, MD  lisinopril (PRINIVIL,ZESTRIL) 10 MG tablet Take 10 mg by mouth daily.   Yes Historical Provider, MD   metFORMIN (GLUCOPHAGE) 1000 MG tablet Take 1,000 mg by mouth 2 (two) times daily with a meal.   Yes Historical Provider, MD  pravastatin (PRAVACHOL) 40 MG tablet Take 40 mg by mouth daily. In afternoon   Yes Historical Provider, MD  aspirin 81 MG tablet Take 81 mg by mouth daily. Reported on 05/25/2016    Historical Provider, MD  atenolol (TENORMIN) 50 MG tablet Take 50 mg by mouth daily. Reported on 05/25/2016    Historical Provider, MD  brimonidine (ALPHAGAN) 0.2 % ophthalmic solution Place 1 drop into the left eye 2 (two) times daily. Reported on 05/25/2016    Historical Provider, MD  insulin lispro (HUMALOG) 100 UNIT/ML injection Inject into the skin 3 (three) times daily before meals. Reported on 05/25/2016    Historical Provider, MD  ranitidine (ZANTAC) 150 MG tablet Take 150 mg by mouth 2 (two) times daily. Reported on 05/25/2016    Historical Provider, MD  timolol (BETIMOL) 0.5 % ophthalmic solution Place 1 drop into the left eye 2 (two) times daily. Reported on 05/25/2016    Historical Provider, MD    Anti-infectives    Start     Dose/Rate Route Frequency Ordered Stop   01/28/17 0715  ceFAZolin (ANCEF) IVPB 1 g/50 mL premix     1 g 100 mL/hr over 30 Minutes Intravenous  Once 01/28/17 0702 01/28/17 0847   01/28/17 0000  ceFAZolin (ANCEF) IVPB 1 g/50 mL premix  Status:  Discontinued    Comments:  Send with pt to OR   1 g 100 mL/hr over 30 Minutes Intravenous On call 01/27/17 1335 01/27/17 1549   01/28/17 0000  ceFAZolin (ANCEF) IVPB 2 g/50 mL premix     2 g 100 mL/hr over 30 Minutes Intravenous On call 01/27/17 1549 01/29/17 0000   01/27/17 1900  piperacillin-tazobactam (ZOSYN) IVPB 3.375 g     3.375 g 12.5 mL/hr over 240 Minutes Intravenous Every 8 hours 01/27/17 1630     01/26/17 0800  vancomycin (VANCOCIN) IVPB 1000 mg/200 mL premix  Status:  Discontinued     1,000 mg 200 mL/hr over 60 Minutes Intravenous Every 12 hours 01/25/17 2039 01/27/17 0957   01/24/17 1800  ceFEPIme  (MAXIPIME) 2 g in dextrose 5 % 50 mL IVPB  Status:  Discontinued     2 g 100 mL/hr over 30 Minutes Intravenous Every 12 hours 01/24/17 1747 01/27/17 0957   01/24/17 1730  metroNIDAZOLE (FLAGYL) tablet 500 mg  Status:  Discontinued     500 mg Oral Every 8 hours 01/24/17 1713 01/27/17 0957   01/22/17 2000  vancomycin (  VANCOCIN) IVPB 1000 mg/200 mL premix  Status:  Discontinued     1,000 mg 200 mL/hr over 60 Minutes Intravenous Every 18 hours 01/22/17 1650 01/25/17 2039   01/22/17 1800  piperacillin-tazobactam (ZOSYN) IVPB 3.375 g  Status:  Discontinued     3.375 g 12.5 mL/hr over 240 Minutes Intravenous Every 8 hours 01/22/17 1650 01/24/17 1713   01/22/17 1130  piperacillin-tazobactam (ZOSYN) IVPB 3.375 g     3.375 g 100 mL/hr over 30 Minutes Intravenous  Once 01/22/17 1123 01/22/17 1235   01/22/17 1130  vancomycin (VANCOCIN) IVPB 1000 mg/200 mL premix     1,000 mg 200 mL/hr over 60 Minutes Intravenous  Once 01/22/17 1123 01/22/17 1357      Scheduled Meds: . aspirin  81 mg Oral Daily  . atenolol  50 mg Oral Daily  . calcium-vitamin D  2 tablet Oral Q breakfast  . Chlorhexidine Gluconate Cloth  6 each Topical Q0600  . clopidogrel  75 mg Oral Daily  . docusate sodium  100 mg Oral BID  . famotidine  20 mg Oral BID  . furosemide  20 mg Oral Daily  . gabapentin  300 mg Oral TID  . heparin  5,000 Units Subcutaneous Q8H  . insulin aspart  0-15 Units Subcutaneous TID WC  . letrozole  2.5 mg Oral Daily  . linagliptin  5 mg Oral Daily  . lisinopril  10 mg Oral Daily  . metFORMIN  1,000 mg Oral BID WC  . pantoprazole  40 mg Oral Daily  . piperacillin-tazobactam (ZOSYN)  IV  3.375 g Intravenous Q8H  . pravastatin  40 mg Oral Daily   Continuous Infusions: . sodium chloride 75 mL/hr at 01/29/17 2200   PRN Meds:.sodium chloride, acetaminophen **OR** acetaminophen, albuterol, alum & mag hydroxide-simeth, bisacodyl, cloNIDine, guaiFENesin-dextromethorphan, morphine injection, ondansetron  **OR** ondansetron (ZOFRAN) IV, phenol  No Known Allergies  Physical Exam  Vitals  Blood pressure (!) 139/56, pulse 78, temperature 98.7 F (37.1 C), temperature source Oral, resp. rate 18, height 5\' 3"  (1.6 m), weight 87.8 kg (193 lb 9.6 oz), SpO2 96 %.  Lower Extremity exam: Patient's doing well there is no swelling or redness to the toes or the ankle region. Foot is wrapped. Wound VAC is intact but cord was unplugged once we rectified that the wound VAC start functioning again appropriately. *  Data Review  CBC  Recent Labs Lab 01/26/17 0526  WBC 12.8*  HGB 8.3*  HCT 24.3*  PLT 307  MCV 83.8  MCH 28.5  MCHC 34.0  RDW 12.8   ------------------------------------------------------------------------------------------------------------------  Chemistries   Recent Labs Lab 01/24/17 0343 01/26/17 0526 01/28/17 0530  NA 137 134* 137  K 4.4 3.5 3.2*  CL 103 104 99*  CO2 26 24 32  GLUCOSE 151* 113* 124*  BUN 20 18 13   CREATININE 0.91 0.83 0.84  CALCIUM 8.4* 7.9* 8.1*   ------------------------------------------------------------------------------------------------------------------  Assessment & Plan: Overall stable wound VAC was nonfunctional for undetermined period time due to cord unplugged problem. Requested nursing to check it routinely to make sure still functioning. The dressing and wound VAC intact we'll reevaluate again tomorrow. She is scheduled for rehabilitation and they should be able to change the wound VAC every 3 days at rehabilitation.  Perry Mount M.D on 01/30/2017 at 10:02 AM  Thank you for the consult, we will follow the patient with you in the Hospital.

## 2017-01-30 NOTE — Progress Notes (Addendum)
[facility] [place] NAME: Elizabeth [name]    MR#:  [id number]  DATE OF BIRTH:  08/13/1951  SUBJECTIVE:   No acute events overnight. Afebrile. No complaints presently.    REVIEW OF SYSTEMS:   Review of Systems  Constitutional: Negative for chills, fever and weight loss.  HENT: Negative for ear discharge, ear pain and nosebleeds.   Eyes: Negative for blurred vision, pain and discharge.  Respiratory: Negative for sputum production, shortness of breath, wheezing and stridor.   Cardiovascular: Negative for chest pain, palpitations, orthopnea and PND.  Gastrointestinal: Negative for abdominal pain, diarrhea, nausea and vomiting.  Genitourinary: Negative for frequency and urgency.  Musculoskeletal: Negative for back pain and joint pain.  Neurological: Positive for weakness. Negative for sensory change, speech change and focal weakness.  Psychiatric/Behavioral: Negative for depression and hallucinations. The patient is not nervous/anxious.    Tolerating Diet: yes Tolerating PT: pending  DRUG ALLERGIES:  No Known Allergies  VITALS:  Blood pressure (!) 139/56, pulse 78, temperature 98.7 F (37.1 C), temperature source Oral, resp. rate 18, height 5\' 3"  (1.6 m), weight 87.8 kg (193 lb 9.6 oz), SpO2 96 %.  PHYSICAL EXAMINATION:   Physical Exam  GENERAL:  66 y.o.-year-old patient lying in the bed in no acute distress.  EYES: Pupils equal, round, reactive to light and accommodation. No scleral icterus. Extraocular muscles intact.  HEENT: Head atraumatic, normocephalic. Oropharynx and nasopharynx clear.  NECK:  Supple, no jugular venous distention. No thyroid enlargement, no tenderness.  LUNGS: Normal breath sounds bilaterally, no wheezing, rales, rhonchi. No use of accessory muscles of respiration.  CARDIOVASCULAR: S1, S2 normal. No murmurs, rubs, or gallops.  ABDOMEN: Soft, nontender, nondistended. Bowel sounds present. No organomegaly  or mass.  EXTREMITIES: No cyanosis, clubbing or edema b/l.   Right foot dressing with wound vac in place.   NEUROLOGIC: Cranial nerves II through XII are intact. No focal Motor or sensory deficits b/l. Globally weak.   PSYCHIATRIC:  patient is alert and oriented x 3.  SKIN: No obvious rash, lesion, or ulcer.   LABORATORY PANEL:  CBC  Recent Labs Lab 01/26/17 0526  WBC 12.8*  HGB 8.3*  HCT 24.3*  PLT 307    Chemistries   Recent Labs Lab 01/28/17 0530  NA 137  K 3.2*  CL 99*  CO2 32  GLUCOSE 124*  BUN 13  CREATININE 0.84  CALCIUM 8.1*   Cardiac Enzymes No results for input(s): TROPONINI in the last 168 hours. RADIOLOGY:  No results found. ASSESSMENT AND PLAN:   Elizabeth [name] a 66 y.o.femalehas a past medical history significant for breast cancer, HTN, DM ,and lymphedema now with progressive pain, swelling, and redness of RLE with fever despite po Doxycycline.   1. Right lower extremity cellulitis with swelling/edema/osteomyelitis with Peripheral arterial disease. - s/p Fifth ray amputation right foot with debridement of deep necrotic gangrenous tissue POD # 5.  - Appreciate podiatry and vascular surgery input. -Seen by infectious disease, antibiotics narrowed down to just IV Zosyn for now. Will likely need 4-6 weeks of treatment.  - pt. Is s/p PICC Line already.  - s/p further debridement and antibiotic beads and wound vac placement POD # 1. Discussed with Podiatry and will get PT eval tomorrow.  - wound cultures growing anaerobe but ID pending.    2. Diabetes- BS stable.  Cont. SSI for now.  - cont. Metformin, Tradjenta  3. Chronic lymphedema/suspected PAD - cont ASA +  Plavix, Pravachol.  4.. Hypertension - cont. Lisinopril, Clonidine, Atenolol. BP stable.   5. History of breast cancer-continue letrozole.  6. Hyperlipidemia-continue Pravachol.  7. GERD-continue Protonix, Pepcid.    Case discussed with Care Management/Social Worker. Management  plans discussed with the patient, family and they are in agreement.  CODE STATUS: FULL  DVT Prophylaxis: lovenox  TOTAL TIME TAKING CARE OF THIS PATIENT: 25 minutes.   POSSIBLE D/C IN 2-3 DAYS, DEPENDING ON CLINICAL CONDITION.  Note: This dictation was prepared with Dragon dictation along with smaller phrase technology. Any transcriptional errors that result from this process are unintentional.  Henreitta Leber M.D on 01/30/2017 at 1:31 PM  Between 7am to 6pm - Pager - [phone]  After 6pm go to [email or web address] - password [email or web address]  Office  [phone]  CC: Primary care physician; Marguerita Merles, MD

## 2017-01-30 NOTE — Progress Notes (Signed)
Lab called to notify pt blood culture growing 3X anaerobe. MD [place] notified. No new orders at this time.

## 2017-01-31 ENCOUNTER — Encounter: Payer: Self-pay | Admitting: Vascular Surgery

## 2017-01-31 LAB — GLUCOSE, CAPILLARY
GLUCOSE-CAPILLARY: 97 mg/dL (ref 65–99)
GLUCOSE-CAPILLARY: 101 mg/dL — AB (ref 65–99)
GLUCOSE-CAPILLARY: 132 mg/dL — AB (ref 65–99)
Glucose-Capillary: 113 mg/dL — ABNORMAL HIGH (ref 65–99)

## 2017-01-31 NOTE — Consult Note (Signed)
Pharmacy Antibiotic Note  Elizabeth [name] is a 66 y.o. female admitted on 01/22/2017 with wound infection.  Pharmacy has consulted for zosyn dosing.  Plan: Zosyn 3.375g IV q8h (4 hour infusion).  Likely will need 4-6 weeks of antimicrobial therapy.   Height: 5\' 3"  (160 cm) Weight: 183 lb 14.4 oz (83.4 kg) IBW/kg (Calculated) : 52.4  Temp (24hrs), Avg:98.6 F (37 C), Min:98 F (36.7 C), Max:98.9 F (37.2 C)   Recent Labs Lab 01/25/17 1925 01/26/17 0526 01/28/17 0530  WBC  --  12.8*  --   CREATININE  --  0.83 0.84  VANCOTROUGH 14*  --   --     Estimated Creatinine Clearance: 68.3 mL/min (by C-G formula based on SCr of 0.84 mg/dL).    No Known Allergies  Antimicrobials this admission: vancomycin 3/17 >> 3/22 zosyn 3/17 >> 3/19, 3/22>> Cefepime 3/19>>3/22 Metronidazole 3/19>>3/22  Dose adjustments this admission:   Microbiology results: Recent Results (from the past 240 hour(s))  Blood Culture (routine x 2)     Status: None   Collection Time: 01/22/17 11:46 AM  Result Value Ref Range Status   Specimen Description BLOOD LEFT ASSIST CONTROL  Final   Special Requests BOTTLES DRAWN AEROBIC AND ANAEROBIC BCAV  Final   Culture NO GROWTH 5 DAYS  Final   Report Status 01/27/2017 FINAL  Final  Blood Culture (routine x 2)     Status: None   Collection Time: 01/22/17 11:46 AM  Result Value Ref Range Status   Specimen Description BLOOD RIGHT ASSIST CONTROL  Final   Special Requests BOTTLES DRAWN AEROBIC AND ANAEROBIC BCAV  Final   Culture NO GROWTH 5 DAYS  Final   Report Status 01/27/2017 FINAL  Final  MRSA PCR Screening     Status: Abnormal   Collection Time: 01/22/17  4:20 PM  Result Value Ref Range Status   MRSA by PCR POSITIVE (A) NEGATIVE Final    Comment:        The GeneXpert MRSA Assay (FDA approved for NASAL specimens only), is one component of a comprehensive MRSA colonization surveillance program. It is not intended to diagnose MRSA infection nor to guide  or monitor treatment for MRSA infections. RESULT CALLED TO, READ BACK BY AND VERIFIED WITH: Talbot Grumbling @ 5427 01/22/17 by [facility]   Urine culture     Status: None   Collection Time: 01/22/17  9:24 PM  Result Value Ref Range Status   Specimen Description URINE, RANDOM  Final   Special Requests NONE  Final   Culture   Final    NO GROWTH Performed at [facility], [address] [place], [place] [address]    Report Status 01/25/2017 FINAL  Final  Aerobic/Anaerobic Culture (surgical/deep wound)     Status: None (Preliminary result)   Collection Time: 01/24/17  5:00 PM  Result Value Ref Range Status   Specimen Description FOOT  Final   Special Requests RIGHT FOOT NO ANAEROBIC SWAB SUBMITTED  Final   Gram Stain   Final    FEW WBC PRESENT, PREDOMINANTLY PMN MODERATE GRAM POSITIVE COCCI IN PAIRS FEW GRAM POSITIVE COCCI IN CLUSTERS RARE GRAM NEGATIVE RODS RARE GRAM POSITIVE RODS    Culture   Final    NORMAL SKIN FLORA GROWING AEROBICALLY FEW BACTEROIDES THETAIOTAOMICRON BETA LACTAMASE POSITIVE HOLDING FOR POSSIBLE ANAEROBE Performed at [facility], [place] [address] [place], [place] [address]    Report Status PENDING  Incomplete  C difficile quick scan w PCR  reflex     Status: None   Collection Time: 01/25/17 12:00 AM  Result Value Ref Range Status   C Diff antigen NEGATIVE NEGATIVE Final   C Diff toxin NEGATIVE NEGATIVE Final   C Diff interpretation No C. difficile detected.  Final  Aerobic/Anaerobic Culture (surgical/deep wound)     Status: None (Preliminary result)   Collection Time: 01/25/17 12:35 PM  Result Value Ref Range Status   Specimen Description TISSUE  Final   Special Requests RIGHT FIFTH TOE  Final   Gram Stain   Final    RARE WBC PRESENT,BOTH PMN AND MONONUCLEAR NO ORGANISMS SEEN    Culture   Final    HOLDING FOR POSSIBLE ANAEROBE CRITICAL RESULT CALLED TO, READ BACK BY AND VERIFIED WITH: A RODRIGUEZ,RN AT 1105 01/30/17 BY L BENFIELD CONCERNING  GROWTH ON CULTURE Performed at [facility], [place] [address], [place], [place] [address]    Report Status PENDING  Incomplete  Aerobic/Anaerobic Culture (surgical/deep wound)     Status: None (Preliminary result)   Collection Time: 01/29/17  8:46 AM  Result Value Ref Range Status   Specimen Description WOUND  Final   Special Requests PENDING  Incomplete   Gram Stain   Final    MODERATE WBC PRESENT, PREDOMINANTLY MONONUCLEAR RARE WBC PRESENT, PREDOMINANTLY PMN NO ORGANISMS SEEN    Culture   Final    NO GROWTH 1 DAY Performed at [facility], [place] [address], [place], [place] [address]    Report Status PENDING  Incomplete     Thank you for allowing pharmacy to be a part of this patient's care.  Larene Beach, PharmD  Clinical Pharmacist  01/31/2017 11:35 AM

## 2017-01-31 NOTE — Progress Notes (Signed)
[place] [facility] [place] NAME: Elizabeth [name]    MR#:  [id number]  DATE OF BIRTH:  23-Oct-1951  SUBJECTIVE:   No acute events overnight.  NO complaints presently. Awaiting PT eval and likely d/c to SNF tomorrow.   REVIEW OF SYSTEMS:   Review of Systems  Constitutional: Negative for chills, fever and weight loss.  HENT: Negative for ear discharge, ear pain and nosebleeds.   Eyes: Negative for blurred vision, pain and discharge.  Respiratory: Negative for sputum production, shortness of breath, wheezing and stridor.   Cardiovascular: Negative for chest pain, palpitations, orthopnea and PND.  Gastrointestinal: Negative for abdominal pain, diarrhea, nausea and vomiting.  Genitourinary: Negative for frequency and urgency.  Musculoskeletal: Negative for back pain and joint pain.  Neurological: Positive for weakness. Negative for sensory change, speech change and focal weakness.  Psychiatric/Behavioral: Negative for depression and hallucinations. The patient is not nervous/anxious.    Tolerating Diet: yes Tolerating PT: pending  DRUG ALLERGIES:  No Known Allergies  VITALS:  Blood pressure (!) 138/58, pulse 71, temperature 98.9 F (37.2 C), temperature source Oral, resp. rate 16, height 5\' 3"  (1.6 m), weight 83.4 kg (183 lb 14.4 oz), SpO2 98 %.  PHYSICAL EXAMINATION:   Physical Exam  GENERAL:  66 y.o.-year-old patient lying in the bed in no acute distress.  EYES: Pupils equal, round, reactive to light and accommodation. No scleral icterus. Extraocular muscles intact.  HEENT: Head atraumatic, normocephalic. Oropharynx and nasopharynx clear.  NECK:  Supple, no jugular venous distention. No thyroid enlargement, no tenderness.  LUNGS: Normal breath sounds bilaterally, no wheezing, rales, rhonchi. No use of accessory muscles of respiration.  CARDIOVASCULAR: S1, S2 normal. No murmurs, rubs, or gallops.  ABDOMEN: Soft, nontender,  nondistended. Bowel sounds present. No organomegaly or mass.  EXTREMITIES: No cyanosis, clubbing or edema b/l.   Right foot dressing with wound vac in place.   NEUROLOGIC: Cranial nerves II through XII are intact. No focal Motor or sensory deficits b/l. Globally weak.   PSYCHIATRIC:  patient is alert and oriented x 3.  SKIN: No obvious rash, lesion, or ulcer.   LABORATORY PANEL:  CBC  Recent Labs Lab 01/26/17 0526  WBC 12.8*  HGB 8.3*  HCT 24.3*  PLT 307    Chemistries   Recent Labs Lab 01/28/17 0530  NA 137  K 3.2*  CL 99*  CO2 32  GLUCOSE 124*  BUN 13  CREATININE 0.84  CALCIUM 8.1*   Cardiac Enzymes No results for input(s): TROPONINI in the last 168 hours. RADIOLOGY:  No results found. ASSESSMENT AND PLAN:   Elizabeth [name] a 66 y.o.femalehas a past medical history significant for breast cancer, HTN, DM ,and lymphedema now with progressive pain, swelling, and redness of RLE with fever despite po Doxycycline.   1. Right lower extremity cellulitis with swelling/edema/osteomyelitis with Peripheral arterial disease. - s/p Fifth ray amputation right foot with debridement of deep necrotic gangrenous tissue POD # 6.  - Appreciate podiatry and vascular surgery input. -Seen by infectious disease, antibiotics narrowed down to just IV Zosyn for now. Will likely need 4-6 weeks of treatment.  - pt. Is s/p PICC Line already.  - s/p further debridement and antibiotic beads and wound vac placement POD # 2. Discussed with Podiatry and await PT eval today.   - wound cultures growing anaerobe but ID pending.    2. Diabetes- BS stable.  Cont. SSI for now.  - cont. Metformin, Tradjenta  3. Chronic lymphedema/suspected PAD - cont ASA + Plavix, Pravachol.  4.. Hypertension - cont. Lisinopril, Clonidine, Atenolol. BP stable.   5. History of breast cancer-continue letrozole.  6. Hyperlipidemia-continue Pravachol.  7. GERD-continue Protonix, Pepcid.   Possible d/c to  SNF tomorrow.   Case discussed with Care Management/Social Worker. Management plans discussed with the patient, family and they are in agreement.  CODE STATUS: FULL  DVT Prophylaxis: lovenox  TOTAL TIME TAKING CARE OF THIS PATIENT: 25 minutes.   POSSIBLE D/C IN 1-2 DAYS, DEPENDING ON CLINICAL CONDITION.  Note: This dictation was prepared with Dragon dictation along with smaller phrase technology. Any transcriptional errors that result from this process are unintentional.  Henreitta Leber M.D on 01/31/2017 at 2:05 PM  Between 7am to 6pm - Pager - [phone]  After 6pm go to [email or web address] - password [email or web address] [facility]  Office  [phone]  CC: Primary care physician; Marguerita Merles, MD

## 2017-01-31 NOTE — Evaluation (Signed)
Physical Therapy Re-Evaluation Patient Details Name: Elizabeth [name] MRN: [id number] DOB: [birth year] Today's Date: 01/31/2017   History of Present Illness  66 y/o female here with R LE cellulitis.  She had a R 5th ray amputation, re-vascularization on 01/28/17, s/p 4th debridement 01/29/17.     Clinical Impression  Pt stated she was feeling good this afternoon w/ no complaints of pain. Educated patient on [place] precautions for the R LE prior to beginning mobility. Overall the patient has good UE and L LE strength w/ decreased strength and ROM on the R LE. Pt able to move to sitting at EOB w/ modified independence and requires min assist to transfer to standing w/ RW. Pt ambulated short distance/duration to her door (68ft) in her room w/ RW and a chair follow for safety. She was able to maintain PWBing precautions throughout ambulation and was limited by fatigue. Pt currently displays decreased strength, ROM and activity tolerance that limit safe functional mobility, she will benefit from skilled PT to correct deficits and return to her prior level of function. Recommend pt transition to STR when medically appropriate for further rehabilitation.     Follow Up Recommendations SNF    Equipment Recommendations  Rolling walker with 5" wheels    Recommendations for Other Services       Precautions / Restrictions Precautions Precautions: Fall;Other (comment) (limited standing to few minutes) Restrictions Weight Bearing Restrictions: Yes RLE Weight Bearing: Partial weight bearing RLE Partial Weight Bearing Percentage or Pounds: 50% Other Position/Activity Restrictions: post op boot in standing       Mobility  Bed Mobility Overal bed mobility: Modified Independent Bed Mobility: Supine to Sit     Supine to sit: Modified independent (Device/Increase time)     General bed mobility comments: pt able to transfer to sitting at EOB with increased time   Transfers Overall transfer level:  Needs assistance Equipment used: Rolling walker (2 wheeled) Transfers: Sit to/from Stand Sit to Stand: Min assist         General transfer comment: pt able to transfer w/ use of bilat UEs and cuing to maintain PWBing of R LE, use of RW for additional stability  Ambulation/Gait Ambulation/Gait assistance: Min guard;+2 safety/equipment Ambulation Distance (Feet): 15 Feet Assistive device: Rolling walker (2 wheeled) Gait Pattern/deviations: Step-to pattern;Decreased step length - left;Decreased stance time - right;Trunk flexed;Decreased stride length   Gait velocity interpretation: Below normal speed for age/gender General Gait Details: pt able to ambulate through room to door, cuing to maintain [place], takes small short steps w/ decreased cadence, required chair follow for safety and maintain physician's orders of few minutes in standing,  Stairs            Wheelchair Mobility    Modified Rankin (Stroke Patients Only)       Balance Overall balance assessment: Needs assistance Sitting-balance support: No upper extremity supported;Feet supported Sitting balance-Leahy Scale: Good Sitting balance - Comments: Pt able to sit at EOB w/o back support   Standing balance support: Bilateral upper extremity supported Standing balance-Leahy Scale: Poor Standing balance comment: reqruies bilat UE support to maintain balance and WBing precuations in standing                             Pertinent Vitals/Pain Pain Assessment: No/denies pain    Home Living Family/patient expects to be discharged to:: Skilled nursing facility Living Arrangements: Spouse/significant other  Prior Function Level of Independence: Independent with assistive device(s)         Comments: Pt reports she was able to get out and be active occasionally ambulates w/ SPC,, though she no longer drives      Hand Dominance        Extremity/Trunk Assessment   Upper  Extremity Assessment Upper Extremity Assessment: Overall WFL for tasks assessed    Lower Extremity Assessment Lower Extremity Assessment: RLE deficits/detail RLE Deficits / Details: unable to fully assess due to wound vac, grossly 4/5 throughout       Communication   Communication: No difficulties  Cognition Arousal/Alertness: Awake/alert Behavior During Therapy: WFL for tasks assessed/performed Overall Cognitive Status: Within Functional Limits for tasks assessed                                        General Comments      Exercises     Assessment/Plan    PT Assessment Patient needs continued PT services  PT Problem List Decreased activity tolerance;Decreased balance;Decreased mobility;Decreased knowledge of use of DME;Decreased knowledge of precautions;Decreased strength;Decreased range of motion       PT Treatment Interventions DME instruction;Gait training;Stair training;Functional mobility training;Therapeutic activities;Therapeutic exercise;Balance training;Patient/family education    PT Goals (Current goals can be found in the Care Plan section)  Acute Rehab PT Goals Patient Stated Goal: Return home PT Goal Formulation: With patient Time For Goal Achievement: 02/10/17 Potential to Achieve Goals: Fair    Frequency 7X/week   Barriers to discharge Inaccessible home environment      Co-evaluation               End of Session Equipment Utilized During Treatment: Gait belt Activity Tolerance: Patient tolerated treatment well Patient left: in chair;with call bell/phone within reach;with chair alarm set Nurse Communication: Mobility status PT Visit Diagnosis: Unsteadiness on feet (R26.81);Difficulty in walking, not elsewhere classified (R26.2)    Time: 3567-0141 PT Time Calculation (min) (ACUTE ONLY): 26 min   Charges:         PT G Codes:        [employer] Student PT 01/31/17, 3:57 PM [phone]   Berda Shelvin 01/31/2017, 3:51 PM

## 2017-01-31 NOTE — Progress Notes (Signed)
[facility] INFECTIOUS DISEASE PROGRESS NOTE Date of Admission:  01/22/2017     ID: Elizabeth [name] is a 66 y.o. female with necrotizing foot infection  Principal Problem:   Cellulitis Active Problems:   Diabetes mellitus, type 2 ([place])   Morbid obesity ([place])   Hypokalemia   Osteomyelitis of right foot ([place])   Type 2 diabetes mellitus with peripheral vascular disease ([place])   Subjective: s/p repeat debridement, no fevers, working with PT   ROS  Eleven systems are reviewed and negative except per hpi  Medications:  Antibiotics Given (last 72 hours)    Date/Time Action Medication Dose Rate   01/28/17 1628 Given   piperacillin-tazobactam (ZOSYN) IVPB 3.375 g 3.375 g 12.5 mL/hr   01/28/17 2223 Given   piperacillin-tazobactam (ZOSYN) IVPB 3.375 g 3.375 g 12.5 mL/hr   01/29/17 1334 Given   piperacillin-tazobactam (ZOSYN) IVPB 3.375 g 3.375 g 12.5 mL/hr   01/29/17 2157 Given   piperacillin-tazobactam (ZOSYN) IVPB 3.375 g 3.375 g 12.5 mL/hr   01/30/17 0644 Given   piperacillin-tazobactam (ZOSYN) IVPB 3.375 g 3.375 g 12.5 mL/hr   01/30/17 1353 Given   piperacillin-tazobactam (ZOSYN) IVPB 3.375 g 3.375 g 12.5 mL/hr   01/30/17 2233 Given   piperacillin-tazobactam (ZOSYN) IVPB 3.375 g 3.375 g 12.5 mL/hr   01/31/17 3646 Given   piperacillin-tazobactam (ZOSYN) IVPB 3.375 g 3.375 g 12.5 mL/hr   01/31/17 1359 Given   piperacillin-tazobactam (ZOSYN) IVPB 3.375 g 3.375 g 12.5 mL/hr     . aspirin  81 mg Oral Daily  . atenolol  50 mg Oral Daily  . calcium-vitamin D  2 tablet Oral Q breakfast  . Chlorhexidine Gluconate Cloth  6 each Topical Q0600  . clopidogrel  75 mg Oral Daily  . docusate sodium  100 mg Oral BID  . famotidine  20 mg Oral BID  . furosemide  20 mg Oral Daily  . gabapentin  300 mg Oral TID  . heparin  5,000 Units Subcutaneous Q8H  . insulin aspart  0-15 Units Subcutaneous TID WC  . letrozole  2.5 mg Oral Daily  . linagliptin  5 mg Oral Daily  . lisinopril  10 mg  Oral Daily  . metFORMIN  1,000 mg Oral BID WC  . pantoprazole  40 mg Oral Daily  . piperacillin-tazobactam (ZOSYN)  IV  3.375 g Intravenous Q8H  . pravastatin  40 mg Oral Daily    Objective: Vital signs in last 24 hours: Temp:  [98 F (36.7 C)-98.9 F (37.2 C)] 98.9 F (37.2 C) (03/26 0729) Pulse Rate:  [58-71] 71 (03/26 0950) Resp:  [16] 16 (03/26 0729) BP: (124-138)/(50-58) 138/58 (03/26 0950) SpO2:  [96 %-98 %] 98 % (03/26 0729) Weight:  [83.4 kg (183 lb 14.4 oz)] 83.4 kg (183 lb 14.4 oz) (03/26 0600) Constitutional:  Chronically ill appearing, but up working with PT HENT: [place]/AT, PERRLA, no scleral icterus Mouth/Throat: Oropharynx is clear and moist. No oropharyngeal exudate.  Cardiovascular: Normal rate, regular rhythm and normal heart sounds. Pulmonary/Chest: Effort normal and breath sounds normal. No respiratory distress.  has no wheezes.  Neck = supple, no nuchal rigidity Abdominal: Soft. Bowel sounds are normal.  exhibits no distension. There is no tenderness.  Lymphadenopathy: no cervical adenopathy. No axillary adenopathy Neurological: alert and oriented to person, place, and time.  Ext RLE with 2+ edema Skin:  R foot with wound vac Psychiatric: a normal mood and affect.  behavior is normal.   Lab Results No results for input(s): WBC, HGB,  HCT, NA, K, CL, CO2, BUN, CREATININE, GLU in the last 72 hours.  Invalid input(s): PLATELETS Lab Results  Component Value Date   ESRSEDRATE 126 (H) 01/24/2017    Microbiology: Results for orders placed or performed during the hospital encounter of 01/22/17  Blood Culture (routine x 2)     Status: None   Collection Time: 01/22/17 11:46 AM  Result Value Ref Range Status   Specimen Description BLOOD LEFT ASSIST CONTROL  Final   Special Requests BOTTLES DRAWN AEROBIC AND ANAEROBIC BCAV  Final   Culture NO GROWTH 5 DAYS  Final   Report Status 01/27/2017 FINAL  Final  Blood Culture (routine x 2)     Status: None   Collection  Time: 01/22/17 11:46 AM  Result Value Ref Range Status   Specimen Description BLOOD RIGHT ASSIST CONTROL  Final   Special Requests BOTTLES DRAWN AEROBIC AND ANAEROBIC BCAV  Final   Culture NO GROWTH 5 DAYS  Final   Report Status 01/27/2017 FINAL  Final  MRSA PCR Screening     Status: Abnormal   Collection Time: 01/22/17  4:20 PM  Result Value Ref Range Status   MRSA by PCR POSITIVE (A) NEGATIVE Final    Comment:        The GeneXpert MRSA Assay (FDA approved for NASAL specimens only), is one component of a comprehensive MRSA colonization surveillance program. It is not intended to diagnose MRSA infection nor to guide or monitor treatment for MRSA infections. RESULT CALLED TO, READ BACK BY AND VERIFIED WITH: Talbot Grumbling @ 6226 01/22/17 by [facility]   Urine culture     Status: None   Collection Time: 01/22/17  9:24 PM  Result Value Ref Range Status   Specimen Description URINE, RANDOM  Final   Special Requests NONE  Final   Culture   Final    NO GROWTH Performed at [facility], [address] [place], [place] [address]    Report Status 01/25/2017 FINAL  Final  Aerobic/Anaerobic Culture (surgical/deep wound)     Status: None (Preliminary result)   Collection Time: 01/24/17  5:00 PM  Result Value Ref Range Status   Specimen Description FOOT  Final   Special Requests RIGHT FOOT NO ANAEROBIC SWAB SUBMITTED  Final   Gram Stain   Final    FEW WBC PRESENT, PREDOMINANTLY PMN MODERATE GRAM POSITIVE COCCI IN PAIRS FEW GRAM POSITIVE COCCI IN CLUSTERS RARE GRAM NEGATIVE RODS RARE GRAM POSITIVE RODS    Culture   Final    NORMAL SKIN FLORA GROWING AEROBICALLY FEW BACTEROIDES THETAIOTAOMICRON BETA LACTAMASE POSITIVE HOLDING FOR POSSIBLE ANAEROBE Performed at [facility], [place] [address] [place], [place] [address]    Report Status PENDING  Incomplete  C difficile quick scan w PCR reflex     Status: None   Collection Time: 01/25/17 12:00 AM  Result Value Ref Range  Status   C Diff antigen NEGATIVE NEGATIVE Final   C Diff toxin NEGATIVE NEGATIVE Final   C Diff interpretation No C. difficile detected.  Final  Aerobic/Anaerobic Culture (surgical/deep wound)     Status: None (Preliminary result)   Collection Time: 01/25/17 12:35 PM  Result Value Ref Range Status   Specimen Description TISSUE  Final   Special Requests RIGHT FIFTH TOE  Final   Gram Stain   Final    RARE WBC PRESENT,BOTH PMN AND MONONUCLEAR NO ORGANISMS SEEN    Culture   Final    HOLDING FOR POSSIBLE ANAEROBE  CRITICAL RESULT CALLED TO, READ BACK BY AND VERIFIED WITH: A RODRIGUEZ,RN AT 1105 01/30/17 BY L BENFIELD CONCERNING GROWTH ON CULTURE Performed at [facility], [place] [address] [place], [place] [address]    Report Status PENDING  Incomplete  Aerobic/Anaerobic Culture (surgical/deep wound)     Status: None (Preliminary result)   Collection Time: 01/29/17  8:46 AM  Result Value Ref Range Status   Specimen Description WOUND  Final   Special Requests PENDING  Incomplete   Gram Stain   Final    MODERATE WBC PRESENT, PREDOMINANTLY MONONUCLEAR RARE WBC PRESENT, PREDOMINANTLY PMN NO ORGANISMS SEEN    Culture   Final    NO GROWTH 2 DAYS NO ANAEROBES ISOLATED; CULTURE IN PROGRESS FOR 5 DAYS Performed at [place] [address], [place], [place] [address]    Report Status PENDING  Incomplete    Studies/Results: No results found.  Assessment/Plan: NITYA CAUTHON is a 66 y.o. female with DM, B CA admitted with celluliits, necrotizing infection and osteomyelitis of R foot . Dr Elvina Mattes performed on 3/20 fifth ray amputation and debridement of deep necrotic tissue, concern also for 4th ray involvment. ESR is 126, crp 28, cx  With normal skin flora, but MRSA PCR + - no mrsa found on culture.  S/p angioplasty 3/22 and then repeat surgery 3/24 with excisional debridement of skin, tendon, muscle and placement of abx beads and wound vac  Recommendations Consolidated to  just zosyn Will need likely 4-6 weeks IV therapy  - see IV abx order sheet Thank you very much for the consult. Will follow with you.  FITZGERALD, DAVID P   01/31/2017, 3:20 PM

## 2017-01-31 NOTE — Progress Notes (Signed)
Patient Demographics  Elizabeth [name], is a 66 y.o. female   MRN: [id number]   DOB - 03/23/1951  Admit Date - 01/22/2017    Outpatient Primary MD for the patient is Marguerita Merles, MD  Consult requested in the Hospital by Henreitta Leber, MD, On 01/31/2017     With History of -  Past Medical History:  Diagnosis Date  . Cancer [facility])    breast  . Carcinoma of right breast ([place]) 05/20/2014  . Diabetes mellitus without complication ([place])   . Hypertension   . Lymphedema   . PONV (postoperative nausea and vomiting)   . Psoriasis 1990  . Stroke ([place]) 2000   residual left arm weakness  . Varicose veins       Past Surgical History:  Procedure Laterality Date  . AMPUTATION TOE Right 01/25/2017   Procedure: AMPUTATION TOE   ;  Surgeon: Albertine Patricia, DPM;  Location: [facility] ORS;  Service: Podiatry;  Laterality: Right;  . BREAST EXCISIONAL BIOPSY Right 2015   positive, with radiation  . BREAST LUMPECTOMY Right 04-2014   followed by radiation,  no chemo  . BREAST REDUCTION SURGERY  1977  . CHOLECYSTECTOMY N/A 09/23/2015   Procedure: LAPAROSCOPIC CHOLECYSTECTOMY;  Surgeon: Bonner Puna, MD;  Location: [facility] ORS;  Service: General;  Laterality: N/A;  . DILATION AND CURETTAGE OF UTERUS  2010  . EYE SURGERY Left 2015   cataracts and left eye detached retina repair  . EYE SURGERY Right 2013   cataract with len implant  . GASTRIC ROUX-EN-Y N/A 09/23/2015   Procedure: LAPAROSCOPIC ROUX-EN-Y GASTRIC;  Surgeon: Bonner Puna, MD;  Location: [facility] ORS;  Service: General;  Laterality: N/A;  . HIATAL HERNIA REPAIR N/A 09/23/2015   Procedure: LAPAROSCOPIC REPAIR OF HIATAL HERNIA;  Surgeon: Bonner Puna, MD;  Location: [facility] ORS;  Service: General;  Laterality: N/A;  . IRRIGATION AND DEBRIDEMENT FOOT Right 01/25/2017   Procedure:  IRRIGATION AND DEBRIDEMENT FOOT;  Surgeon: Albertine Patricia, DPM;  Location: [facility] OR[facility];  Service: Podiatry;  Laterality: Right;  . IRRIGATION AND DEBRIDEMENT FOOT Right 01/29/2017   Procedure: IRRIGATION AND DEBRIDEMENT FOOT and application of wound vac;  Surgeon: Albertine Patricia, DPM;  Location: [facility] ORS;  Service: Podiatry;  Laterality: Right;  . LOWER EXTREMITY ANGIOGRAPHY Right 01/28/2017   Procedure: Lower Extremity Angiography;  Surgeon: Katha Cabal, MD;  Location: [place] [facility];  Service: Cardiovascular;  Laterality: Right;  . LOWER EXTREMITY INTERVENTION  01/28/2017   Procedure: Lower Extremity Intervention;  Surgeon: Katha Cabal, MD;  Location: [place] [facility];  Service: Cardiovascular;;  . REDUCTION MAMMAPLASTY      in for   Chief Complaint  Patient presents with  . Cellulitis     HPI  Elizabeth [name]  is a 66 y.o. female, After she had a vascular procedure last Friday outrigger back to the OR on Saturday for further debridement and more wound closure. Also applied a wound VAC.   Social History Social History  Substance Use Topics  . Smoking status: Never Smoker  . Smokeless tobacco: Never Used  . Alcohol use No     Family History Family History  Problem Relation  Age of Onset  . Diabetes Mother   . Cancer Mother   . Breast cancer Mother 67  . Diabetes Father   . Diabetes Sister   . Diabetes Brother     Prior to Admission medications   Medication Sig Start Date End Date Taking? Authorizing Provider  albuterol (PROVENTIL HFA;VENTOLIN HFA) 108 (90 BASE) MCG/ACT inhaler Inhale 2 puffs into the lungs every 6 (six) hours as needed for wheezing or shortness of breath.   Yes Historical Provider, MD  calcium-vitamin D (OSCAL WITH D) 500-200 MG-UNIT tablet Take 2 tablets by mouth daily with breakfast. 11/23/16  Yes Lloyd Huger, MD  clopidogrel (PLAVIX) 75 MG tablet Take 75 mg by mouth. 11/06/15  Yes Historical Provider, MD  furosemide (LASIX) 20  MG tablet Take 20 mg by mouth daily.   Yes Historical Provider, MD  gabapentin (NEURONTIN) 300 MG capsule Take 300 mg by mouth 3 (three) times daily.   Yes Historical Provider, MD  letrozole (FEMARA) 2.5 MG tablet Take 1 tablet (2.5 mg total) by mouth daily. 11/23/16  Yes Lloyd Huger, MD  linagliptin (TRADJENTA) 5 MG TABS tablet Take 5 mg by mouth daily.   Yes Historical Provider, MD  lisinopril (PRINIVIL,ZESTRIL) 10 MG tablet Take 10 mg by mouth daily.   Yes Historical Provider, MD  metFORMIN (GLUCOPHAGE) 1000 MG tablet Take 1,000 mg by mouth 2 (two) times daily with a meal.   Yes Historical Provider, MD  pravastatin (PRAVACHOL) 40 MG tablet Take 40 mg by mouth daily. In afternoon   Yes Historical Provider, MD  aspirin 81 MG tablet Take 81 mg by mouth daily. Reported on 05/25/2016    Historical Provider, MD  atenolol (TENORMIN) 50 MG tablet Take 50 mg by mouth daily. Reported on 05/25/2016    Historical Provider, MD  brimonidine (ALPHAGAN) 0.2 % ophthalmic solution Place 1 drop into the left eye 2 (two) times daily. Reported on 05/25/2016    Historical Provider, MD  insulin lispro (HUMALOG) 100 UNIT/ML injection Inject into the skin 3 (three) times daily before meals. Reported on 05/25/2016    Historical Provider, MD  ranitidine (ZANTAC) 150 MG tablet Take 150 mg by mouth 2 (two) times daily. Reported on 05/25/2016    Historical Provider, MD  timolol (BETIMOL) 0.5 % ophthalmic solution Place 1 drop into the left eye 2 (two) times daily. Reported on 05/25/2016    Historical Provider, MD    Anti-infectives    Start     Dose/Rate Route Frequency Ordered Stop   01/28/17 0715  ceFAZolin (ANCEF) IVPB 1 g/50 mL premix     1 g 100 mL/hr over 30 Minutes Intravenous  Once 01/28/17 0702 01/28/17 0847   01/28/17 0000  ceFAZolin (ANCEF) IVPB 1 g/50 mL premix  Status:  Discontinued    Comments:  Send with pt to OR   1 g 100 mL/hr over 30 Minutes Intravenous On call 01/27/17 1335 01/27/17 1549   01/28/17  0000  ceFAZolin (ANCEF) IVPB 2 g/50 mL premix     2 g 100 mL/hr over 30 Minutes Intravenous On call 01/27/17 1549 01/29/17 0000   01/27/17 1900  piperacillin-tazobactam (ZOSYN) IVPB 3.375 g     3.375 g 12.5 mL/hr over 240 Minutes Intravenous Every 8 hours 01/27/17 1630     01/26/17 0800  vancomycin (VANCOCIN) IVPB 1000 mg/200 mL premix  Status:  Discontinued     1,000 mg 200 mL/hr over 60 Minutes Intravenous Every 12 hours 01/25/17 2039 01/27/17 0957  01/24/17 1800  ceFEPIme (MAXIPIME) 2 g in dextrose 5 % 50 mL IVPB  Status:  Discontinued     2 g 100 mL/hr over 30 Minutes Intravenous Every 12 hours 01/24/17 1747 01/27/17 0957   01/24/17 1730  metroNIDAZOLE (FLAGYL) tablet 500 mg  Status:  Discontinued     500 mg Oral Every 8 hours 01/24/17 1713 01/27/17 0957   01/22/17 2000  vancomycin (VANCOCIN) IVPB 1000 mg/200 mL premix  Status:  Discontinued     1,000 mg 200 mL/hr over 60 Minutes Intravenous Every 18 hours 01/22/17 1650 01/25/17 2039   01/22/17 1800  piperacillin-tazobactam (ZOSYN) IVPB 3.375 g  Status:  Discontinued     3.375 g 12.5 mL/hr over 240 Minutes Intravenous Every 8 hours 01/22/17 1650 01/24/17 1713   01/22/17 1130  piperacillin-tazobactam (ZOSYN) IVPB 3.375 g     3.375 g 100 mL/hr over 30 Minutes Intravenous  Once 01/22/17 1123 01/22/17 1235   01/22/17 1130  vancomycin (VANCOCIN) IVPB 1000 mg/200 mL premix     1,000 mg 200 mL/hr over 60 Minutes Intravenous  Once 01/22/17 1123 01/22/17 1357      Scheduled Meds: . aspirin  81 mg Oral Daily  . atenolol  50 mg Oral Daily  . calcium-vitamin D  2 tablet Oral Q breakfast  . Chlorhexidine Gluconate Cloth  6 each Topical Q0600  . clopidogrel  75 mg Oral Daily  . docusate sodium  100 mg Oral BID  . famotidine  20 mg Oral BID  . furosemide  20 mg Oral Daily  . gabapentin  300 mg Oral TID  . heparin  5,000 Units Subcutaneous Q8H  . insulin aspart  0-15 Units Subcutaneous TID WC  . letrozole  2.5 mg Oral Daily  .  linagliptin  5 mg Oral Daily  . lisinopril  10 mg Oral Daily  . metFORMIN  1,000 mg Oral BID WC  . pantoprazole  40 mg Oral Daily  . piperacillin-tazobactam (ZOSYN)  IV  3.375 g Intravenous Q8H  . pravastatin  40 mg Oral Daily   Continuous Infusions: PRN Meds:.sodium chloride, acetaminophen **OR** acetaminophen, albuterol, alum & mag hydroxide-simeth, bisacodyl, cloNIDine, guaiFENesin-dextromethorphan, morphine injection, ondansetron **OR** ondansetron (ZOFRAN) IV, phenol  No Known Allergies  Physical Exam  Vitals  Blood pressure (!) 121/52, pulse 64, temperature 99.2 F (37.3 C), temperature source Oral, resp. rate 16, height 5\' 3"  (1.6 m), weight 83.4 kg (183 lb 14.4 oz), SpO2 95 %.  Lower Extremity exam:Wound VAC is removed today the foot looks much better. The infection cellulitis and is rapidly improving. Starting to formula granulation tissue at the central wound that has the wound VAC placement. Other portions of the incision appear to be stable.  Data Review  CBC  Recent Labs Lab 01/26/17 0526  WBC 12.8*  HGB 8.3*  HCT 24.3*  PLT 307  MCV 83.8  MCH 28.5  MCHC 34.0  RDW 12.8   ------------------------------------------------------------------------------------------------------------------  Chemistries   Recent Labs Lab 01/26/17 0526 01/28/17 0530  NA 134* 137  K 3.5 3.2*  CL 104 99*  CO2 24 32  GLUCOSE 113* 124*  BUN 18 13  CREATININE 0.83 0.84  CALCIUM 7.9* 8.1*   -----------------------------------------------------------------------------------------------------------------  Assessment & Plan: Overall the patient is progressing of foot looks much better than it did last week. Still will take a long time to get the heel all the way. Plan: Need to continue the wound VAC at this timeframe at 125 mmHg continuous. This wound VAC should be  changed every 3 days. Also recommend wrapping the foot with Kerlix gauze to protect it and should be checked every day  to make sure it stays in place. Bathroom privileges are okay as long as she has assistance and a walker and wears the OrthoWedge shoe to keep pressure off the surgical region. Antibiotics as per Dr. Ola Spurr. Return to see me Tuesday or Wednesday of next week. From my perspective she is okay for transfer to rehabilitation at this timeframe.  Principal Problem:   Cellulitis Active Problems:   Diabetes mellitus, type 2 (HCC)   Morbid obesity ([place])   Hypokalemia   Osteomyelitis of right foot (HCC)   Type 2 diabetes mellitus with peripheral vascular disease ([place])     Family Communication: Plan discussed with patient and **   Thank you for the consult, we will follow the patient with you in the Hospital.   Perry Mount M.D on 01/31/2017 at 6:05 PM  Thank you for the consult, we will follow the patient with you in the Hospital.

## 2017-01-31 NOTE — Progress Notes (Signed)
Plan is for patient to D/C to [facility]. Per [facility] admissions coordinator at [facility] authorization is still pending. Clinical Education officer, museum (CSW) sent most recent PT notes to [facility]. Per [facility] will be able to get a wound vac for patient. CSW will continue to follow and assist as needed.   [employer], LCSW [phone]

## 2017-02-01 LAB — AEROBIC/ANAEROBIC CULTURE (SURGICAL/DEEP WOUND): CULTURE: NORMAL

## 2017-02-01 LAB — CBC
HEMATOCRIT: 27.9 % — AB (ref 35.0–47.0)
HEMOGLOBIN: 9.4 g/dL — AB (ref 12.0–16.0)
MCH: 28.6 pg (ref 26.0–34.0)
MCHC: 33.6 g/dL (ref 32.0–36.0)
MCV: 84.9 fL (ref 80.0–100.0)
Platelets: 460 10*3/uL — ABNORMAL HIGH (ref 150–440)
RBC: 3.29 MIL/uL — AB (ref 3.80–5.20)
RDW: 13.3 % (ref 11.5–14.5)
WBC: 12 10*3/uL — ABNORMAL HIGH (ref 3.6–11.0)

## 2017-02-01 LAB — AEROBIC/ANAEROBIC CULTURE W GRAM STAIN (SURGICAL/DEEP WOUND)

## 2017-02-01 LAB — BASIC METABOLIC PANEL
Anion gap: 5 (ref 5–15)
BUN: 15 mg/dL (ref 6–20)
CO2: 31 mmol/L (ref 22–32)
Calcium: 8.1 mg/dL — ABNORMAL LOW (ref 8.9–10.3)
Chloride: 103 mmol/L (ref 101–111)
Creatinine, Ser: 0.92 mg/dL (ref 0.44–1.00)
GFR calc Af Amer: >60 mL/min (ref >60)
GFR calc non Af Amer: >60 mL/min (ref >60)
Glucose, Bld: 105 mg/dL — ABNORMAL HIGH (ref 65–99)
Potassium: 3.1 mmol/L — ABNORMAL LOW (ref 3.5–5.1)
Sodium: 139 mmol/L (ref 135–145)

## 2017-02-01 LAB — GLUCOSE, CAPILLARY
GLUCOSE-CAPILLARY: 103 mg/dL — AB (ref 65–99)
GLUCOSE-CAPILLARY: 89 mg/dL (ref 65–99)

## 2017-02-01 MED ORDER — HYDROCODONE-ACETAMINOPHEN 5-325 MG PO TABS: 1.0000 | ORAL_TABLET | Freq: Four times a day (QID) | ORAL | 0 refills | Status: DC | PRN

## 2017-02-01 MED ORDER — PIPERACILLIN-TAZOBACTAM 3.375 G IVPB: 3.3750 g | Freq: Three times a day (TID) | INTRAVENOUS | Status: AC

## 2017-02-01 NOTE — Progress Notes (Addendum)
Patient is medically stable for discharge today to [facility]. Per Magda Paganini, admissions coordinator at [facility] patient can come to room 509. Per [facility] Gess authorization was received and facility has wound vac. Social work Patent examiner discharge orders in [place]. Social work Theatre manager met with patient at bedside making her aware of discharge today to [facility]. Patient verbally agreed she understood. Social work Theatre manager spoke to son Elizabeth [name] making him aware of patient's discharge today. RN will call report and arrange EMS. Patient's son verbally agreed he understood. Please re-consult of future social work needs arise.  Social work Architectural technologist off.   [name]. Social Work Intern  [phone]

## 2017-02-01 NOTE — Discharge Summary (Signed)
[name] NAME: Elizabeth [name]    MR#:  [id number]  DATE OF BIRTH:  1951/02/17  DATE OF ADMISSION:  01/22/2017 ADMITTING PHYSICIAN: Idelle Crouch, MD  DATE OF DISCHARGE: 02/01/2017  PRIMARY CARE PHYSICIAN: Marguerita Merles, MD    ADMISSION DIAGNOSIS:  SIRS (systemic inflammatory response syndrome) (HCC) [R65.10] Cellulitis of right lower extremity [L03.115]  DISCHARGE DIAGNOSIS:  Principal Problem:   Cellulitis Active Problems:   Diabetes mellitus, type 2 (HCC)   Morbid obesity (HCC)   Hypokalemia   Osteomyelitis of right foot (HCC)   Type 2 diabetes mellitus with peripheral vascular disease ([place])   SECONDARY DIAGNOSIS:   Past Medical History:  Diagnosis Date  . Cancer Elizabeth [name])    breast  . Carcinoma of right breast ([place]) 05/20/2014  . Diabetes mellitus without complication ([place])   . Hypertension   . Lymphedema   . PONV (postoperative nausea and vomiting)   . Psoriasis 1990  . Stroke ([place]) 2000   residual left arm weakness  . Varicose veins     HOSPITAL COURSE:   [name] a 66 y.o.femalehas a past medical history significant for breast cancer, HTN, DM ,and lymphedema now with progressive pain, swelling, and redness of RLE with fever despite po Doxycycline.   1. Right lower extremity cellulitis with swelling/edema/osteomyelitis with Peripheral arterial disease. -Patient was admitted to the hospital for worsening right lower extremity infection secondary to cellulitis/osteomyelitis. Patient was started on broad-spectrum IV antibiotics with vancomycin, Zosyn. -Patient was seen by podiatry and is s/p Fifth ray amputation right foot with debridement of deep necrotic gangrenous tissue POD # 7 today.  Patient was also seen by vascular surgery and is status post angiogram with percutaneous angioplasty to the right anterior tibial artery. -Patient also underwent further debridement of the infected area with  antibiotic bead placement and wound VAC placement and is postop day #3 today. Patient has a PICC line and now being discharged on 4-6 weeks of long-term IV therapy. She'll be discharged on IV Zosyn and follow up with podiatry, infectious disease as an outpatient. - pt. Wound cultures have not grown anything specific except some anaerobic bugs which have not been identified.   These are Podiatry Recommendations for Discharge.   Need to continue the wound VAC at this timeframe at 125 mmHg continuous. This wound VAC should be changed every 3 days. Also recommend wrapping the foot with Kerlix gauze to protect it and should be checked every day to make sure it stays in place. Bathroom privileges are okay as long as she has assistance and a walker and wears the OrthoWedge shoe to keep pressure off the surgical region. Antibiotics as per Dr. Ola Spurr. Return to see me Tuesday or Wednesday of next week   2. Diabetes- she is blood sugars remained stable while in the hospital. She was maintained on some sliding scale insulin. -She will continue her metformin, Tradjenta and her Lispro with meals.  3. Chronic lymphedema/PAD - pt. Will cont. ASA, Plavix, Pravachol.   4.. Hypertension - cont. Lisinopril, Atenolol. BP stable.   5. History of breast cancer-continue letrozole.  6. Hyperlipidemia-continue Pravachol.  7. GERD-pt. Will cont. Her Zantac.   DISCHARGE CONDITIONS:   Stable.   CONSULTS OBTAINED:  Treatment Team:  Elizabeth [name], DPM Elizabeth Ramsay, MD  DRUG ALLERGIES:  No Known Allergies  DISCHARGE MEDICATIONS:   Allergies as of 02/01/2017   No Known Allergies     Medication List  TAKE these medications   albuterol 108 (90 Base) MCG/ACT inhaler Commonly known as:  PROVENTIL HFA;VENTOLIN HFA Inhale 2 puffs into the lungs every 6 (six) hours as needed for wheezing or shortness of breath.   aspirin 81 MG tablet Take 81 mg by mouth daily. Reported on 05/25/2016    atenolol 50 MG tablet Commonly known as:  TENORMIN Take 50 mg by mouth daily. Reported on 05/25/2016   brimonidine 0.2 % ophthalmic solution Commonly known as:  ALPHAGAN Place 1 drop into the left eye 2 (two) times daily. Reported on 05/25/2016   calcium-vitamin D 500-200 MG-UNIT tablet Commonly known as:  OSCAL WITH D Take 2 tablets by mouth daily with breakfast.   clopidogrel 75 MG tablet Commonly known as:  PLAVIX Take 75 mg by mouth.   furosemide 20 MG tablet Commonly known as:  LASIX Take 20 mg by mouth daily.   gabapentin 300 MG capsule Commonly known as:  NEURONTIN Take 300 mg by mouth 3 (three) times daily.   HYDROcodone-acetaminophen 5-325 MG tablet Commonly known as:  NORCO Take 1 tablet by mouth every 6 (six) hours as needed for moderate pain.   insulin lispro 100 UNIT/ML injection Commonly known as:  HUMALOG Inject into the skin 3 (three) times daily before meals. Reported on 05/25/2016   letrozole 2.5 MG tablet Commonly known as:  FEMARA Take 1 tablet (2.5 mg total) by mouth daily.   linagliptin 5 MG Tabs tablet Commonly known as:  TRADJENTA Take 5 mg by mouth daily.   lisinopril 10 MG tablet Commonly known as:  PRINIVIL,ZESTRIL Take 10 mg by mouth daily.   metFORMIN 1000 MG tablet Commonly known as:  GLUCOPHAGE Take 1,000 mg by mouth 2 (two) times daily with a meal.   piperacillin-tazobactam 3.375 GM/50ML IVPB Commonly known as:  ZOSYN Inject 50 mLs (3.375 g total) into the vein every 8 (eight) hours. Stop Date on 02/23/17   pravastatin 40 MG tablet Commonly known as:  PRAVACHOL Take 40 mg by mouth daily. In afternoon   ranitidine 150 MG tablet Commonly known as:  ZANTAC Take 150 mg by mouth 2 (two) times daily. Reported on 05/25/2016   timolol 0.5 % ophthalmic solution Commonly known as:  BETIMOL Place 1 drop into the left eye 2 (two) times daily. Reported on 05/25/2016         DISCHARGE INSTRUCTIONS:   DIET:  Cardiac diet and  Diabetic diet  DISCHARGE CONDITION:  Stable  ACTIVITY:  Activity as tolerated  OXYGEN:  Home Oxygen: No.   Oxygen Delivery: room air  DISCHARGE LOCATION:  nursing home   If you experience worsening of your admission symptoms, develop shortness of breath, life threatening emergency, suicidal or homicidal thoughts you must seek medical attention immediately by calling 911 or calling your MD immediately  if symptoms less severe.  You Must read complete instructions/literature along with all the possible adverse reactions/side effects for all the Medicines you take and that have been prescribed to you. Take any new Medicines after you have completely understood and accpet all the possible adverse reactions/side effects.   Please note  You were cared for by a hospitalist during your hospital stay. If you have any questions about your discharge medications or the care you received while you were in the hospital after you are discharged, you can call the unit and asked to speak with the hospitalist on call if the hospitalist that took care of you is not available. Once you are discharged, your  primary care physician will handle any further medical issues. Please note that NO REFILLS for any discharge medications will be authorized once you are discharged, as it is imperative that you return to your primary care physician (or establish a relationship with a primary care physician if you do not have one) for your aftercare needs so that they can reassess your need for medications and monitor your lab values.     Today   No acute complaints presently.  No events overnight.   VITAL SIGNS:  Blood pressure (!) 132/54, pulse 62, temperature 98.1 F (36.7 C), temperature source Oral, resp. rate 18, height 5\' 3"  (1.6 m), weight 86.2 kg (190 lb), SpO2 97 %.  I/O:   Intake/Output Summary (Last 24 hours) at 02/01/17 1137 Last data filed at 02/01/17 0940  Gross per 24 hour  Intake               630 ml  Output                0 ml  Net              630 ml    PHYSICAL EXAMINATION:   GENERAL:  66 y.o.-year-old patient lying in the bed in no acute distress.  EYES: Pupils equal, round, reactive to light and accommodation. No scleral icterus. Extraocular muscles intact.  HEENT: Head atraumatic, normocephalic. Oropharynx and nasopharynx clear.  NECK:  Supple, no jugular venous distention. No thyroid enlargement, no tenderness.  LUNGS: Normal breath sounds bilaterally, no wheezing, rales, rhonchi. No use of accessory muscles of respiration.  CARDIOVASCULAR: S1, S2 normal. No murmurs, rubs, or gallops.  ABDOMEN: Soft, nontender, nondistended. Bowel sounds present. No organomegaly or mass.  EXTREMITIES: No cyanosis, clubbing or edema b/l.   Right foot dressing with wound vac in place.   NEUROLOGIC: Cranial nerves II through XII are intact. No focal Motor or sensory deficits b/l. Globally weak.   PSYCHIATRIC:  patient is alert and oriented x 3.  SKIN: No obvious rash, lesion, or ulcer.   DATA REVIEW:   CBC  Recent Labs Lab 02/01/17 0500  WBC 12.0*  HGB 9.4*  HCT 27.9*  PLT 460*    Chemistries   Recent Labs Lab 02/01/17 0500  NA 139  K 3.1*  CL 103  CO2 31  GLUCOSE 105*  BUN 15  CREATININE 0.92  CALCIUM 8.1*    Cardiac Enzymes No results for input(s): TROPONINI in the last 168 hours.  Microbiology Results  Results for orders placed or performed during the hospital encounter of 01/22/17  Blood Culture (routine x 2)     Status: None   Collection Time: 01/22/17 11:46 AM  Result Value Ref Range Status   Specimen Description BLOOD LEFT ASSIST CONTROL  Final   Special Requests BOTTLES DRAWN AEROBIC AND ANAEROBIC BCAV  Final   Culture NO GROWTH 5 DAYS  Final   Report Status 01/27/2017 FINAL  Final  Blood Culture (routine x 2)     Status: None   Collection Time: 01/22/17 11:46 AM  Result Value Ref Range Status   Specimen Description BLOOD RIGHT ASSIST CONTROL  Final    Special Requests BOTTLES DRAWN AEROBIC AND ANAEROBIC BCAV  Final   Culture NO GROWTH 5 DAYS  Final   Report Status 01/27/2017 FINAL  Final  MRSA PCR Screening     Status: Abnormal   Collection Time: 01/22/17  4:20 PM  Result Value Ref Range Status   MRSA by PCR POSITIVE (  A) NEGATIVE Final    Comment:        The GeneXpert MRSA Assay (FDA approved for NASAL specimens only), is one component of a comprehensive MRSA colonization surveillance program. It is not intended to diagnose MRSA infection nor to guide or monitor treatment for MRSA infections. RESULT CALLED TO, READ BACK BY AND VERIFIED WITH: Talbot Grumbling @ 2774 01/22/17 by [facility]   Urine culture     Status: None   Collection Time: 01/22/17  9:24 PM  Result Value Ref Range Status   Specimen Description URINE, RANDOM  Final   Special Requests NONE  Final   Culture   Final    NO GROWTH Performed at [facility], [address] [place], [place] [address]    Report Status 01/25/2017 FINAL  Final  Aerobic/Anaerobic Culture (surgical/deep wound)     Status: None   Collection Time: 01/24/17  5:00 PM  Result Value Ref Range Status   Specimen Description FOOT  Final   Special Requests RIGHT FOOT NO ANAEROBIC SWAB SUBMITTED  Final   Gram Stain   Final    FEW WBC PRESENT, PREDOMINANTLY PMN MODERATE GRAM POSITIVE COCCI IN PAIRS FEW GRAM POSITIVE COCCI IN CLUSTERS RARE GRAM NEGATIVE RODS RARE GRAM POSITIVE RODS Performed at [facility], [place] [address] [place], [place] [address]    Culture   Final    NORMAL SKIN FLORA GROWING AEROBICALLY FEW BACTEROIDES THETAIOTAOMICRON BETA LACTAMASE POSITIVE FEW ANAEROBIC GRAM POSITIVE RODS UNABLE TO ID ANY FURTHER    Report Status 02/01/2017 FINAL  Final  C difficile quick scan w PCR reflex     Status: None   Collection Time: 01/25/17 12:00 AM  Result Value Ref Range Status   C Diff antigen NEGATIVE NEGATIVE Final   C Diff toxin NEGATIVE NEGATIVE Final   C Diff  interpretation No C. difficile detected.  Final  Aerobic/Anaerobic Culture (surgical/deep wound)     Status: None (Preliminary result)   Collection Time: 01/25/17 12:35 PM  Result Value Ref Range Status   Specimen Description TISSUE  Final   Special Requests RIGHT FIFTH TOE  Final   Gram Stain   Final    RARE WBC PRESENT,BOTH PMN AND MONONUCLEAR NO ORGANISMS SEEN    Culture   Final    HOLDING FOR POSSIBLE ANAEROBE CRITICAL RESULT CALLED TO, READ BACK BY AND VERIFIED WITH: A RODRIGUEZ,RN AT 1105 01/30/17 BY L BENFIELD CONCERNING GROWTH ON CULTURE Performed at [facility], [place] [address], [place], [place] [address]    Report Status PENDING  Incomplete  Aerobic/Anaerobic Culture (surgical/deep wound)     Status: None (Preliminary result)   Collection Time: 01/29/17  8:46 AM  Result Value Ref Range Status   Specimen Description WOUND  Final   Special Requests PENDING  Incomplete   Gram Stain   Final    MODERATE WBC PRESENT, PREDOMINANTLY MONONUCLEAR RARE WBC PRESENT, PREDOMINANTLY PMN NO ORGANISMS SEEN    Culture   Final    NO GROWTH 2 DAYS NO ANAEROBES ISOLATED; CULTURE IN PROGRESS FOR 5 DAYS Performed at [facility] [address] [place], [place] [address]    Report Status PENDING  Incomplete    RADIOLOGY:  No results found.    Management plans discussed with the patient, family and they are in agreement.  CODE STATUS:     Code Status Orders        Start     Ordered  01/22/17 1650  Full code  Continuous     01/22/17 1650    Code Status History    Date Active Date Inactive Code Status Order ID Comments User Context   09/23/2015  5:16 PM 09/25/2015  8:48 PM Full Code [id number]  Bonner Puna, MD Inpatient      TOTAL TIME TAKING CARE OF THIS PATIENT: 40 minutes.    Henreitta Leber M.D on 02/01/2017 at 11:37 AM  Between 7am to 6pm - Pager - [phone]  After 6pm go to [email or web address] - Proofreader  [facility]   Office  [phone]  CC: Primary care physician; Marguerita Merles, MD

## 2017-02-01 NOTE — Progress Notes (Signed)
EMS here to transport pt, PICC remains in place. Nurse reported to [facility] Nurse to connect wound vac when she gets there.

## 2017-02-01 NOTE — Clinical Social Work Placement (Signed)
   CLINICAL SOCIAL WORK PLACEMENT  NOTE  Date:  02/01/2017  Patient Details  Name: Elizabeth [name] MRN: [id number] Date of Birth: Jan 12, 1951  Clinical Social Work is seeking post-discharge placement for this patient at the [place] level of care (*CSW will initial, date and re-position this form in  chart as items are completed):  Yes   Patient/family provided with [facility] Work Department's list of facilities offering this level of care within the geographic area requested by the patient (or if unable, by the patient's family).  Yes   Patient/family informed of their freedom to choose among providers that offer the needed level of care, that participate in Medicare, Medicaid or managed care program needed by the patient, have an available bed and are willing to accept the patient.  Yes   Patient/family informed of [facility]'s ownership interest in [facility] and [facility], as well as of the fact that they are under no obligation to receive care at these facilities.  PASRR submitted to EDS on 01/27/17     PASRR number received on 01/27/17     Existing PASRR number confirmed on       FL2 transmitted to all facilities in geographic area requested by pt/family on 01/27/17     FL2 transmitted to all facilities within larger geographic area on       Patient informed that his/her managed care company has contracts with or will negotiate with certain facilities, including the following:        Yes   Patient/family informed of bed offers received.  Patient chooses bed at  [facility] )     Physician recommends and patient chooses bed at      Patient to be transferred to  [facility] ) on 02/01/17.  Patient to be transferred to facility by  [facility] )     Patient family notified on 02/01/17 of transfer.  Name of family member notified:   (Patient's son Ludwig [facility] is aware of D/C today. )     PHYSICIAN        Additional Comment:    _______________________________________________ Kristyanna Barcelo, Veronia Beets, LCSW 02/01/2017, 12:29 PM

## 2017-02-01 NOTE — Progress Notes (Signed)
qPhysical Therapy Treatment Patient Details Name: Elizabeth [name] MRN: [id number] DOB: Feb 22, 1951 Today's Date: 02/01/2017    History of Present Illness 66 y/o female here with R LE cellulitis.  She had a R 5th ray amputation, re-vascularization on 3/23, s/p 4th debridement 3/24 (& possible 4th ray amputation).     PT Comments    Participated in exercises as described below.  During tx, pt noticed to be inc large amt BM.  Pt encouraged to alert staff when care is needed and educated on skin protection and breakdown.  She agreed.  Rolling left and right with ease for care and bed change.  To edge of bed with increased time and rail.  Able to maintain balance well in sitting.  Post op shoe donned for standing exercises.  She was then able to transfer to chair at bedside and ambulate forward and backwards with walker x 3 in room.  Overall gait and balance improved today. Maintains PWB well with walker and post-op boot. SNF remains and appropriate discharge plan due to limited mobility, strength and wound care needs.   Follow Up Recommendations  SNF     Equipment Recommendations  Rolling walker with 5" wheels    Recommendations for Other Services       Precautions / Restrictions Precautions Precautions: Fall;Other (comment) Restrictions Weight Bearing Restrictions: Yes RLE Weight Bearing: Partial weight bearing RLE Partial Weight Bearing Percentage or Pounds: 50% Other Position/Activity Restrictions: post op boot in standing     Mobility  Bed Mobility Overal bed mobility: Modified Independent Bed Mobility: Supine to Sit     Supine to sit: Modified independent (Device/Increase time)     General bed mobility comments: pt able to transfer to sitting at EOB with increased time   Transfers Overall transfer level: Needs assistance Equipment used: Rolling walker (2 wheeled) Transfers: Sit to/from Stand Sit to Stand: Min assist            Ambulation/Gait Ambulation/Gait  assistance: Min assist Ambulation Distance (Feet): 15 Feet Assistive device: Rolling walker (2 wheeled) Gait Pattern/deviations: Step-to pattern;Decreased step length - left;Decreased stance time - right;Trunk flexed;Decreased stride length   Gait velocity interpretation: Below normal speed for age/gender General Gait Details: gait forward and backwards x 2 in room with overall improved balance.   Stairs            Wheelchair Mobility    Modified Rankin (Stroke Patients Only)       Balance Overall balance assessment: Needs assistance Sitting-balance support: No upper extremity supported;Feet supported Sitting balance-Leahy Scale: Good Sitting balance - Comments: Pt able to sit at EOB w/o back support   Standing balance support: Bilateral upper extremity supported Standing balance-Leahy Scale: Fair Standing balance comment: reqruies bilat UE support to maintain balance and WBing precuations in standing                            Cognition Arousal/Alertness: Awake/alert Behavior During Therapy: WFL for tasks assessed/performed Overall Cognitive Status: Within Functional Limits for tasks assessed                                        Exercises Other Exercises Other Exercises: supine BLE x 10 for SLR, heel slides, ab/ad SAQ, leg presses Other Exercises: inc large loose BM.  rolling left and right with rails and supervision.  educated need  to alert staff for care as needed Other Exercises: standing marching and SLR x 20 BLE    General Comments        Pertinent Vitals/Pain Pain Assessment: No/denies pain    Home Living                      Prior Function            PT Goals (current goals can now be found in the care plan section) Progress towards PT goals: Progressing toward goals    Frequency    7X/week      PT Plan Current plan remains appropriate    Co-evaluation             End of Session Equipment  Utilized During Treatment: Gait belt Activity Tolerance: Patient tolerated treatment well Patient left: in chair;with call bell/phone within reach;with chair alarm set Nurse Communication: Mobility status       Time: 1017-1050 PT Time Calculation (min) (ACUTE ONLY): 33 min  Charges:  $Gait Training: 8-22 mins $Therapeutic Exercise: 8-22 mins                    G Codes:       Chesley Noon, PTA 02/01/17, 11:06 AM

## 2017-02-01 NOTE — Progress Notes (Signed)
Report called to Lattie Haw at [facility]. EMS called for transportation

## 2017-02-01 NOTE — NC FL2 (Signed)
[place] LEVEL OF CARE SCREENING TOOL     IDENTIFICATION  Patient Name: Elizabeth [name] Birthdate: June 08, 1951 Sex: female Admission Date (Current Location): 01/22/2017  [place] and [place] Number:  Engineering geologist and Address:  [facility], [address] [place], [place] [address]      Provider Number: [id number]  Attending Physician Name and Address:  Henreitta Leber, MD  Relative Name and Phone Number:       Current Level of Care: Hospital Recommended Level of Care: [place] Prior Approval Number:    Date Approved/Denied:   PASRR Number:  [id number])  Discharge Plan: SNF    Current Diagnoses: Patient Active Problem List   Diagnosis Date Noted  . Osteomyelitis of right foot ([place]) 01/28/2017  . Type 2 diabetes mellitus with peripheral vascular disease ([place]) 01/28/2017  . Cellulitis 01/22/2017  . Hypokalemia 01/22/2017  . Morbid obesity ([place]) 09/23/2015  . Diabetes mellitus, type 2 ([place]) 05/21/2015  . Peripheral blood vessel disorder ([place]) 05/21/2015  . 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 Chronic venous insufficiency 02/05/2015  . Mixed incontinence 10/24/2014  . Extreme obesity ([place]) 10/24/2014  . Hernia, rectovaginal 10/24/2014  . Airway hyperreactivity 02/19/2013  . Cerebral vascular accident ([place]) 02/19/2013  . Clinical depression 02/19/2013  . Diabetes mellitus type 2 in obese ([place]) 02/19/2013  . Colon, diverticulosis 02/19/2013  . Acid reflux 02/19/2013  . HLD (hyperlipidemia) 02/19/2013  . BP (high blood pressure) 02/19/2013    Orientation RESPIRATION BLADDER Height & Weight     Self, Time, Situation, Place  Normal Incontinent Weight: 190 lb (86.2 kg) Height:  5\' 3"  (160 cm)  BEHAVIORAL SYMPTOMS/MOOD NEUROLOGICAL BOWEL NUTRITION STATUS   (None.)  (None. ) Continent Diet (Diet: Carb Modified Fluid)  AMBULATORY STATUS COMMUNICATION OF NEEDS Skin    Extensive Assist Verbally Surgical wounds (Incision: Right Foot, Diabetic Ulcer: Right Ulcer)                       Personal Care Assistance Level of Assistance  Bathing, Feeding, Dressing Bathing Assistance: Limited assistance Feeding assistance: Independent Dressing Assistance: Limited assistance     Functional Limitations Info  Sight, Hearing, Speech Sight Info: Adequate Hearing Info: Adequate Speech Info: Adequate    SPECIAL CARE FACTORS FREQUENCY  PT (By licensed PT), OT (By licensed OT)     PT Frequency:  (5) OT Frequency:  (5)            Contractures      Additional Factors Info  Code Status, Allergies, Insulin Sliding Scale Code Status Info:  (Full Code) Allergies Info:  (No Known Allergies)   Insulin Sliding Scale Info:  (NovoLog)       Current Medications (02/01/2017):  This is the current hospital active medication list Current Facility-Administered Medications  Medication Dose Route Frequency Provider Last Rate Last Dose  . 0.9 %  sodium chloride infusion  500 mL Intravenous Once PRN Katha Cabal, MD      . acetaminophen (TYLENOL) tablet 650 mg  650 mg Oral Q6H PRN Idelle Crouch, MD   650 mg at 01/27/17 0847   Or  . acetaminophen (TYLENOL) suppository 650 mg  650 mg Rectal Q6H PRN Idelle Crouch, MD      . albuterol (PROVENTIL) (2.5 MG/3ML) 0.083% nebulizer solution 3 mL  3 mL Inhalation Q6H PRN Idelle Crouch, MD   3 mL at 01/25/17 0854  .  alum & mag hydroxide-simeth (MAALOX/MYLANTA) 200-200-20 MG/5ML suspension 15-30 mL  15-30 mL Oral Q2H PRN Katha Cabal, MD      . aspirin chewable tablet 81 mg  81 mg Oral Daily Idelle Crouch, MD   81 mg at 01/27/17 1033  . atenolol (TENORMIN) tablet 50 mg  50 mg Oral Daily Idelle Crouch, MD   50 mg at 02/01/17 0932  . bisacodyl (DULCOLAX) suppository 10 mg  10 mg Rectal Daily PRN Idelle Crouch, MD      . calcium-vitamin D (OSCAL WITH D) 500-200 MG-UNIT per tablet 2 tablet  2 tablet Oral  Q breakfast Idelle Crouch, MD   2 tablet at 02/01/17 [phone]  . Chlorhexidine Gluconate Cloth 2 % PADS 6 each  6 each Topical Q0600 Henreitta Leber, MD   6 each at 02/01/17 0504  . cloNIDine (CATAPRES) tablet 0.2 mg  0.2 mg Oral Q4H PRN Katha Cabal, MD      . clopidogrel (PLAVIX) tablet 75 mg  75 mg Oral Daily Idelle Crouch, MD   75 mg at 02/01/17 0932  . docusate sodium (COLACE) capsule 100 mg  100 mg Oral BID Idelle Crouch, MD   100 mg at 01/31/17 2138  . famotidine (PEPCID) tablet 20 mg  20 mg Oral BID Idelle Crouch, MD   20 mg at 02/01/17 0932  . furosemide (LASIX) tablet 20 mg  20 mg Oral Daily Idelle Crouch, MD   20 mg at 02/01/17 0933  . gabapentin (NEURONTIN) capsule 300 mg  300 mg Oral TID Idelle Crouch, MD   300 mg at 02/01/17 0932  . guaiFENesin-dextromethorphan (ROBITUSSIN DM) 100-10 MG/5ML syrup 15 mL  15 mL Oral Q4H PRN Katha Cabal, MD      . heparin injection 5,000 Units  5,000 Units Subcutaneous Q8H Idelle Crouch, MD   5,000 Units at 02/01/17 [phone]  . insulin aspart (novoLOG) injection 0-15 Units  0-15 Units Subcutaneous TID WC Vaughan Basta, MD   2 Units at 01/30/17 1220  . letrozole [facility]) tablet 2.5 mg  2.5 mg Oral Daily Idelle Crouch, MD   2.5 mg at 02/01/17 0932  . linagliptin (TRADJENTA) tablet 5 mg  5 mg Oral Daily Idelle Crouch, MD   5 mg at 02/01/17 0933  . lisinopril (PRINIVIL,ZESTRIL) tablet 10 mg  10 mg Oral Daily Idelle Crouch, MD   10 mg at 02/01/17 0932  . metFORMIN (GLUCOPHAGE) tablet 1,000 mg  1,000 mg Oral BID WC Idelle Crouch, MD   1,000 mg at 02/01/17 0842  . morphine 2 MG/ML injection 2 mg  2 mg Intravenous Q2H PRN Idelle Crouch, MD      . ondansetron [facility]) tablet 4 mg  4 mg Oral Q6H PRN Idelle Crouch, MD       Or  . ondansetron [facility]) injection 4 mg  4 mg Intravenous Q6H PRN Idelle Crouch, MD      . pantoprazole (PROTONIX) EC tablet 40 mg  40 mg Oral Daily Katha Cabal, MD   40 mg at  02/01/17 0932  . phenol (CHLORASEPTIC) mouth spray 1 spray  1 spray Mouth/Throat PRN Katha Cabal, MD      . piperacillin-tazobactam (ZOSYN) IVPB 3.375 g  3.375 g Intravenous Q8H Melissa D Maccia, RPH   3.375 g at 02/01/17 0504  . pravastatin (PRAVACHOL) tablet 40 mg  40 mg Oral Daily Idelle Crouch, MD  40 mg at 01/31/17 1644     Discharge Medications: Please see discharge summary for a list of discharge medications.  Relevant Imaging Results:  Relevant Lab Results:   Additional Information  (SSN: [id number]. Patient will need IV anitbiotic (Zosyn) for 4-6 weeeks. )  Ketura Sirek, Veronia Beets, LCSW

## 2017-02-02 DIAGNOSIS — C50919 Malignant neoplasm of unspecified site of unspecified female breast: Secondary | ICD-10-CM | POA: Insufficient documentation

## 2017-02-03 LAB — AEROBIC/ANAEROBIC CULTURE W GRAM STAIN (SURGICAL/DEEP WOUND)

## 2017-02-03 LAB — AEROBIC/ANAEROBIC CULTURE (SURGICAL/DEEP WOUND): CULTURE: NO GROWTH

## 2017-03-23 ENCOUNTER — Other Ambulatory Visit: Payer: Self-pay | Admitting: Family Medicine

## 2017-03-23 DIAGNOSIS — Z13820 Encounter for screening for osteoporosis: Secondary | ICD-10-CM

## 2017-05-22 NOTE — Progress Notes (Signed)
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  INTERVAL HISTORY: Patient returns to clinic for 6 month follow up of breast cancer. She currently feels well and is asymptomatic. She continues to tolerate letrozole without significant side effects. She has no neurologic complaints. She denies any recent fevers or illnesses. She has a good appetite and denies weight loss. She denies any pain. She denies any chest pain or shortness of breath. She has no nausea, vomiting, constipation, or diarrhea. She has no urinary complaints. Patient feels at her baseline and offers no specific complaints today.    REVIEW OF SYSTEMS:   Review of Systems  Constitutional: Negative.  Negative for fever, malaise/fatigue and weight loss.  Respiratory: Negative.  Negative for cough and shortness of breath.   Cardiovascular: Negative.  Negative for chest pain and leg swelling.  Gastrointestinal: Negative.  Negative for abdominal pain.  Genitourinary: Negative.   Musculoskeletal: Negative.   Skin: Negative.  Negative for rash.  Neurological: Negative.  Negative for sensory change and weakness.  Psychiatric/Behavioral: Negative.  The patient is not nervous/anxious.     As per HPI. Otherwise, a complete review of systems is negative.   PAST MEDICAL HISTORY: Past Medical History:  Diagnosis Date  . Cancer [facility])    breast  . Carcinoma of right breast ([place]) 05/20/2014  . Diabetes mellitus without complication ([employer])   . Hypertension   . Lymphedema   . Personal history of radiation therapy   . PONV (postoperative nausea and vomiting)   . Psoriasis 1990  .  Stroke ([place]) 2000   residual left arm weakness  . Varicose veins     PAST SURGICAL HISTORY: Past Surgical History:  Procedure Laterality Date  . AMPUTATION TOE Right 01/25/2017   Procedure: AMPUTATION TOE   ;  Surgeon: Albertine Patricia, DPM;  Location: [facility] ORS;  Service: Podiatry;  Laterality: Right;  . BREAST EXCISIONAL BIOPSY Right 2015   positive, with radiation  . BREAST LUMPECTOMY Right 04-2014   followed by radiation,  no chemo  . BREAST REDUCTION SURGERY  1977  . CHOLECYSTECTOMY N/A 09/23/2015   Procedure: LAPAROSCOPIC CHOLECYSTECTOMY;  Surgeon: Bonner Puna, MD;  Location: [facility] ORS;  Service: General;  Laterality: N/A;  . DILATION AND CURETTAGE OF UTERUS  2010  . EYE SURGERY Left 2015   cataracts and left eye detached retina repair  . EYE SURGERY Right 2013   cataract with len implant  . GASTRIC ROUX-EN-Y N/A 09/23/2015   Procedure: LAPAROSCOPIC ROUX-EN-Y GASTRIC;  Surgeon: Bonner Puna, MD;  Location: [facility] ORS;  Service: General;  Laterality: N/A;  . HIATAL HERNIA REPAIR N/A 09/23/2015   Procedure: LAPAROSCOPIC REPAIR OF HIATAL HERNIA;  Surgeon: Bonner Puna, MD;  Location: [facility] ORS;  Service: General;  Laterality: N/A;  . IRRIGATION AND DEBRIDEMENT FOOT Right 01/25/2017   Procedure: IRRIGATION AND DEBRIDEMENT FOOT;  Surgeon: Albertine Patricia, DPM;  Location: [facility] ORS;  Service: Podiatry;  Laterality: Right;  . IRRIGATION AND DEBRIDEMENT FOOT Right 01/29/2017   Procedure: IRRIGATION AND DEBRIDEMENT FOOT and application of wound vac;  Surgeon: Albertine Patricia, DPM;  Location: [facility] ORS;  Service: Podiatry;  Laterality: Right;  .  LOWER EXTREMITY ANGIOGRAPHY Right 01/28/2017   Procedure: Lower Extremity Angiography;  Surgeon: Katha Cabal, MD;  Location: [place] [facility];  Service: Cardiovascular;  Laterality: Right;  . LOWER EXTREMITY INTERVENTION  01/28/2017   Procedure: Lower Extremity Intervention;  Surgeon: Katha Cabal, MD;  Location: [place] [facility];  Service:  Cardiovascular;;  . REDUCTION MAMMAPLASTY      FAMILY HISTORY Family History  Problem Relation Age of Onset  . Diabetes Mother   . Cancer Mother   . Breast cancer Mother 48  . Diabetes Father   . Diabetes Sister   . Diabetes Brother     GYNECOLOGIC HISTORY:  No LMP recorded. Patient is postmenopausal.     ADVANCED DIRECTIVES:    HEALTH MAINTENANCE: Social History  Substance Use Topics  . Smoking status: Never Smoker  . Smokeless tobacco: Never Used  . Alcohol use No    No Known Allergies  Current Outpatient Prescriptions  Medication Sig Dispense Refill  . albuterol (PROVENTIL HFA;VENTOLIN HFA) 108 (90 BASE) MCG/ACT inhaler Inhale 2 puffs into the lungs every 6 (six) hours as needed for wheezing or shortness of breath.    . calcium-vitamin D (OSCAL WITH D) 500-200 MG-UNIT tablet Take 2 tablets by mouth daily with breakfast. 180 tablet 3  . clopidogrel (PLAVIX) 75 MG tablet Take 75 mg by mouth.    . furosemide (LASIX) 20 MG tablet Take 20 mg by mouth daily.    [name] gabapentin (NEURONTIN) 300 MG capsule Take 300 mg by mouth 3 (three) times daily.    [name] letrozole (FEMARA) 2.5 MG tablet Take 1 tablet (2.5 mg total) by mouth daily. 90 tablet 3  . linagliptin (TRADJENTA) 5 MG TABS tablet Take 5 mg by mouth daily.    [name] lisinopril (PRINIVIL,ZESTRIL) 10 MG tablet Take 10 mg by mouth daily.    . metFORMIN (GLUCOPHAGE) 1000 MG tablet Take 1,000 mg by mouth 2 (two) times daily with a meal.    . pravastatin (PRAVACHOL) 40 MG tablet Take 40 mg by mouth daily. In afternoon    . aspirin 81 MG tablet Take 81 mg by mouth daily. Reported on 05/25/2016    . atenolol (TENORMIN) 50 MG tablet Take 50 mg by mouth daily. Reported on 05/25/2016    . brimonidine (ALPHAGAN) 0.2 % ophthalmic solution Place 1 drop into the left eye 2 (two) times daily. Reported on 05/25/2016    . HYDROcodone-acetaminophen (NORCO) 5-325 MG tablet Take 1 tablet by mouth every 6 (six) hours as needed for moderate pain. (Patient  not taking: Reported on 05/23/2017) 30 tablet 0  . insulin lispro (HUMALOG) 100 UNIT/ML injection Inject into the skin 3 (three) times daily before meals. Reported on 05/25/2016    . ranitidine (ZANTAC) 150 MG tablet Take 150 mg by mouth 2 (two) times daily. Reported on 05/25/2016    . timolol (BETIMOL) 0.5 % ophthalmic solution Place 1 drop into the left eye 2 (two) times daily. Reported on 05/25/2016     No current facility-administered medications for this visit.     OBJECTIVE: BP (!) 144/74   Pulse 66   Temp (!) 96 F (35.6 C) (Tympanic)   Resp 18   Wt 179 lb 6.4 oz (81.4 kg)   BMI 31.78 kg/m    Body mass index is 31.78 kg/m.    ECOG FS:0 - Asymptomatic  General: Well-developed, well-nourished, no acute distress. Eyes: Pink conjunctiva, anicteric sclera. Breasts: Bilateral breast and axilla without lumps or masses. Lungs:  Clear to auscultation bilaterally. Heart: Regular rate and rhythm. No rubs, murmurs, or gallops. Abdomen: Soft, nontender, nondistended. No organomegaly noted, normoactive bowel sounds. Breast: Patient declined breast exam. Musculoskeletal: No edema, cyanosis, or clubbing. Neuro: Alert, answering all questions appropriately. Cranial nerves grossly intact. Skin: No rashes or petechiae noted. Psych: Normal affect.   LAB RESULTS:  No visits with results within 3 Day(s) from this visit.  Latest known visit with results is:  Admission on 01/22/2017, Discharged on 02/01/2017  No results displayed because visit has over 200 results.      STUDIES: Dg Bone Density (dxa)  Result Date: 05/26/2017 EXAM: DUAL X-RAY ABSORPTIOMETRY (DXA) FOR BONE MINERAL DENSITY IMPRESSION: Dear Dr. Lennox Grumbles, Your patient Adysson Revelle completed a BMD test on 05/26/2017 using the [place] (analysis version: 14.10) manufactured by [employer]. The following summarizes the results of our evaluation. PATIENT BIOGRAPHICAL: Name: Dezaray, Shibuya Patient ID: [id number] Birth  Date: [birth year] Height:     63.0 in. Weight:     180.1 lbs. Gender: Female Exam Date: 05/26/2017 Indications: Caucasian, Diabetic, Family Hx of Osteoporosis, High Risk Meds, History of Breast Cancer, History of Radiation, neuropathy, Postmenopausal Fractures: Treatments: CALCIUM VIT D, gabapentin, insulin, letrozole, Metformin, Multi-Vitamin with calcium, plavix, pravastatin, zantac ASSESSMENT: The BMD measured at Femur Neck Left is 1.022 g/cm2 with a T-score of -0.1. This patient is considered NORMAL according to [facility]) criteria. L3 & L4 were excluded due to degenerative changes. Site Region Measured Measured WHO Young Adult BMD Date       Age      Classification T-score AP Spine L1-L2 5/26/[age] 65.5 Normal 0.8 1.274 g/cm2 AP Spine L1-L2 04/13/2016 64.4 Normal 1.1 1.306 g/cm2 AP Spine L1-L2 04/10/2015 63.4 Normal 1.6 1.373 g/cm2 DualFemur Neck Left 05/26/2017 65.5 Normal -0.1 1.022 g/cm2 DualFemur Neck Left 04/13/2016 64.4 Normal 0.1 1.055 g/cm2 DualFemur Neck Left 4/10/2015 63.4 Normal 0.5 1.112 g/cm2 World Health Organization [facility]) criteria for post-menopausal, Caucasian Women: Normal:       T-score at or above -1 SD Osteopenia:   T-score between -1 and -2.5 SD Osteoporosis: T-score at or below -2.5 SD RECOMMENDATIONS: [place] recommends that FDA-approved medical therapies be considered in postmenopausal women and men age 37 or older with a: 1. Hip or vertebral (clinical or morphometric) fracture. 2. T-score of < -2.5 at the spine or hip. 3. Ten-year fracture probability by FRAX of 3% or greater for hip fracture or 20% or greater for major osteoporotic fracture. All treatment decisions require clinical judgment and consideration of individual patient factors, including patient preferences, co-morbidities, previous drug use, risk factors not captured in the FRAX model (e.g. falls, vitamin D deficiency, increased bone turnover, interval significant decline in  bone density) and possible under - or over-estimation of fracture risk by FRAX. All patients should ensure an adequate intake of dietary calcium (1200 mg/d) and vitamin D (800 IU daily) unless contraindicated. FOLLOW-UP: People with diagnosed cases of osteoporosis or at high risk for fracture should have regular bone mineral density tests. For patients eligible for Medicare, routine testing is allowed once every 2 years. The testing frequency can be increased to one year for patients who have rapidly progressing disease, those who are receiving or discontinuing medical therapy to restore bone mass, or have additional risk factors. I have reviewed this report, and agree with the above findings. Mark A. Thornton Papas, M.D. [facility] Radiology Electronically Signed   By: Lavonia Dana M.D.   On: 05/26/2017 10:54  Mm Diag Breast Tomo Bilateral  Result Date: 05/26/2017 CLINICAL DATA:  66 year old female with history of right breast cancer post lumpectomy 2015 followed by radiation therapy. EXAM: 2D DIGITAL DIAGNOSTIC BILATERAL MAMMOGRAM WITH CAD AND ADJUNCT TOMO COMPARISON:  Previous exam(s). ACR Breast Density Category b: There are scattered areas of fibroglandular density. FINDINGS: No suspicious masses or calcifications are seen in either breast. Postsurgical changes are present in the lower central posterior right breast related to prior lumpectomy. Spot compression magnification MLO view of the lumpectomy site in the right breast was performed. There is no mammographic evidence of locally recurrent malignancy. Mammographic images were processed with CAD. IMPRESSION: No mammographic evidence of malignancy in either breast. RECOMMENDATION: Diagnostic mammogram is suggested in 1 year. (Code:DM-B-01Y) I have discussed the findings and recommendations with the patient. Results were also provided in writing at the conclusion of the visit. If applicable, a reminder letter will be sent to the patient regarding the next  appointment. BI-RADS CATEGORY  2: Benign. Electronically Signed   By: Everlean Alstrom M.D.   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   & PLAN:    1. 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 Continue letrozole for total 5 years completing in July 2020. Her most recent mammogram on May 26, 2017 was reported as BI-RADS 2. Repeat in July 2019. Return to clinic in 6 months for further evaluation. 2. Postmenopausal: Patient's most recent bone mineral density on May 26, 2017 reported T score of -0.1 which is normal and unchanged from one year prior. Repeat in 2 years in June 2020.   Patient expressed understanding and was in agreement with this plan. She also understands that She can call clinic at any time with any questions, concerns, or complaints.    Lloyd Huger, MD   05/27/2017 8:45 AM

## 2017-05-23 ENCOUNTER — Inpatient Hospital Stay: Payer: Medicare HMO | Attending: Oncology | Admitting: Oncology

## 2017-05-23 VITALS — BP 144/74 | HR 66 | Temp 96.0°F | Resp 18 | Wt 179.4 lb

## 2017-05-23 DIAGNOSIS — Z170 Estrogen receptor positive status [ER+]: Secondary | ICD-10-CM | POA: Diagnosis not present

## 2017-05-23 DIAGNOSIS — Z8673 Personal history of transient ischemic attack (TIA), and cerebral infarction without residual deficits: Secondary | ICD-10-CM | POA: Diagnosis not present

## 2017-05-23 DIAGNOSIS — C50511 Malignant neoplasm of lower-outer quadrant of right female breast: Secondary | ICD-10-CM | POA: Diagnosis not present

## 2017-05-23 DIAGNOSIS — Z7982 Long term (current) use of aspirin: Secondary | ICD-10-CM | POA: Insufficient documentation

## 2017-05-23 DIAGNOSIS — I890 Lymphedema, not elsewhere classified: Secondary | ICD-10-CM | POA: Insufficient documentation

## 2017-05-23 DIAGNOSIS — Z79899 Other long term (current) drug therapy: Secondary | ICD-10-CM | POA: Diagnosis not present

## 2017-05-23 DIAGNOSIS — I10 Essential (primary) hypertension: Secondary | ICD-10-CM | POA: Diagnosis not present

## 2017-05-23 DIAGNOSIS — Z79811 Long term (current) use of aromatase inhibitors: Secondary | ICD-10-CM | POA: Diagnosis not present

## 2017-05-23 DIAGNOSIS — E119 Type 2 diabetes mellitus without complications: Secondary | ICD-10-CM

## 2017-05-23 DIAGNOSIS — Z794 Long term (current) use of insulin: Secondary | ICD-10-CM | POA: Insufficient documentation

## 2017-05-23 DIAGNOSIS — L409 Psoriasis, unspecified: Secondary | ICD-10-CM | POA: Insufficient documentation

## 2017-05-23 DIAGNOSIS — Z803 Family history of malignant neoplasm of breast: Secondary | ICD-10-CM | POA: Diagnosis not present

## 2017-05-23 NOTE — Progress Notes (Signed)
Patient denies any concerns today.  

## 2017-05-26 ENCOUNTER — Ambulatory Visit
Admission: RE | Admit: 2017-05-26 | Discharge: 2017-05-26 | Disposition: A | Discharge status: Home / Self Care | Payer: Medicare HMO | Source: Ambulatory Visit | Attending: Oncology | Admitting: Oncology

## 2017-05-26 ENCOUNTER — Ambulatory Visit
Admission: RE | Admit: 2017-05-26 | Discharge: 2017-05-26 | Disposition: A | Discharge status: Home / Self Care | Payer: Medicare HMO | Source: Ambulatory Visit | Attending: Family Medicine | Admitting: Family Medicine

## 2017-05-26 DIAGNOSIS — C50511 Malignant neoplasm of lower-outer quadrant of right female breast: Secondary | ICD-10-CM

## 2017-05-26 DIAGNOSIS — Z13820 Encounter for screening for osteoporosis: Secondary | ICD-10-CM | POA: Diagnosis present

## 2017-05-26 HISTORY — DX: Personal history of irradiation: Z92.3

## 2017-05-26 IMAGING — MG MM DIGITAL DIAGNOSTIC BILAT W/ TOMO W/ CAD
8 of 16 series · 8 of 36 positions shown · non-contrast
Comparison: Previous exam(s).

CLINICAL DATA: 65-year-old female with history of right breast
cancer post lumpectomy [ZO] followed by radiation therapy.

EXAM:
2D DIGITAL DIAGNOSTIC BILATERAL MAMMOGRAM WITH CAD AND ADJUNCT TOMO

[R MLO (1 of 2)]
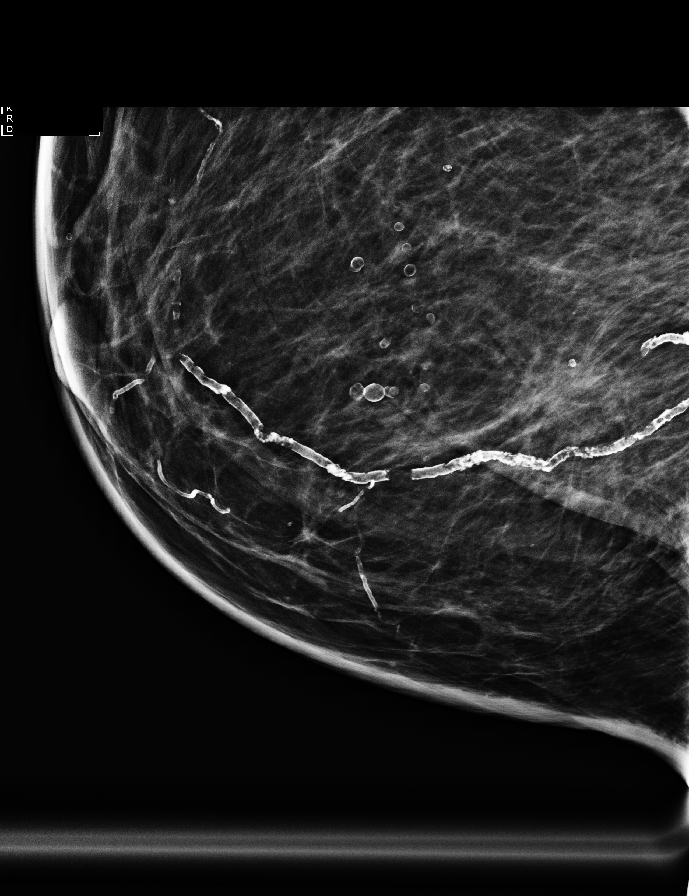

[R MLO synth-2D]
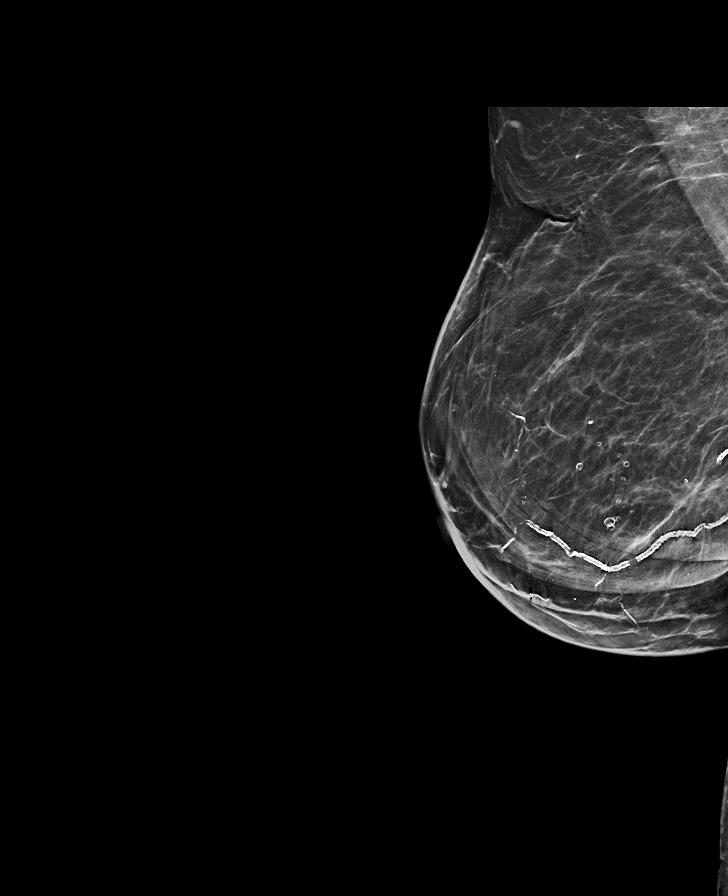

[L MLO synth-2D]
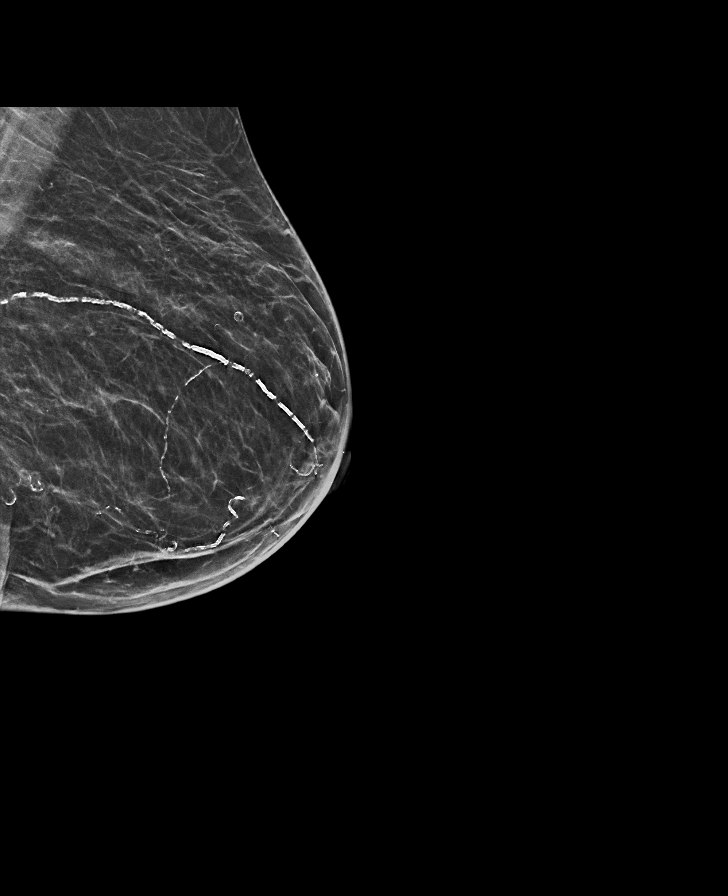

[R CC synth-2D]
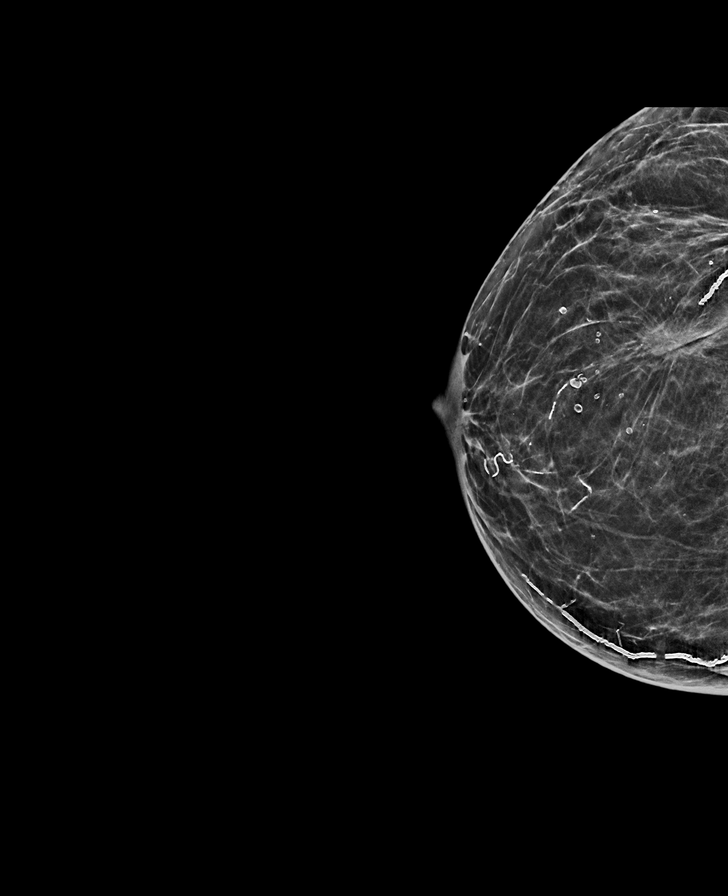

[R CC]
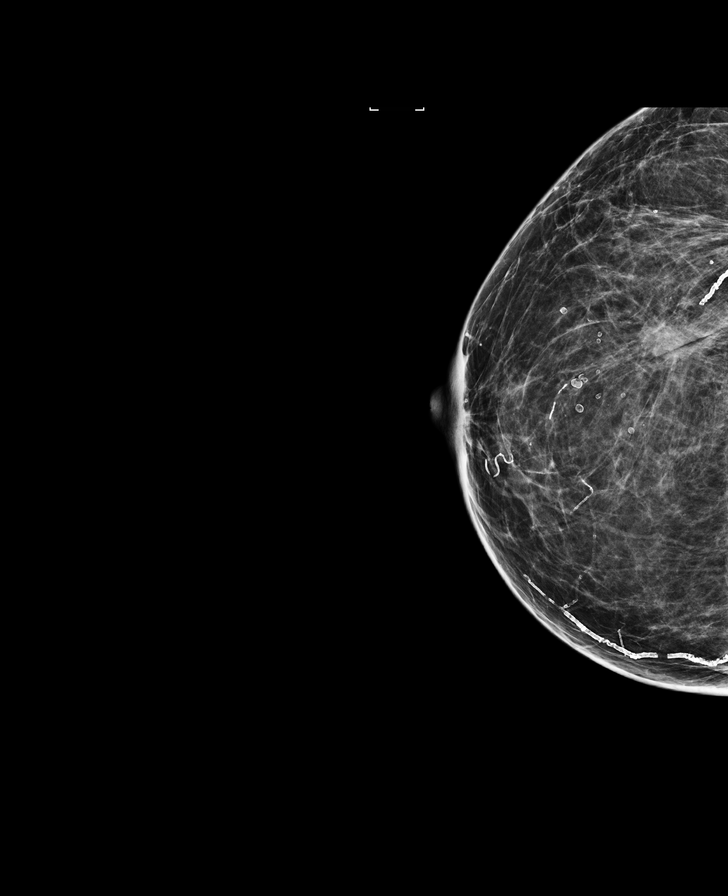

[R MLO (2 of 2)]
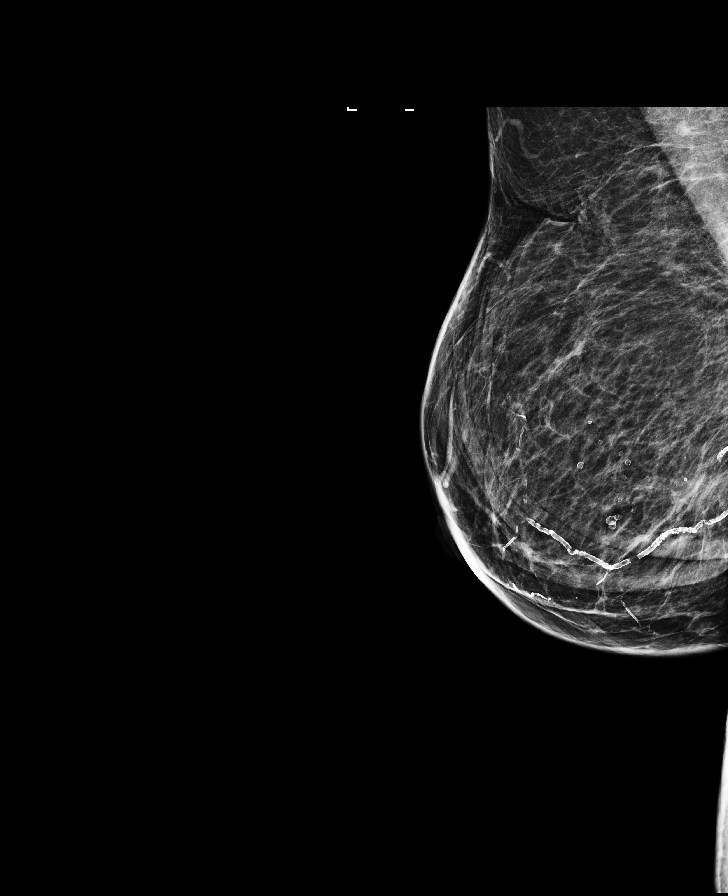

[L CC synth-2D]
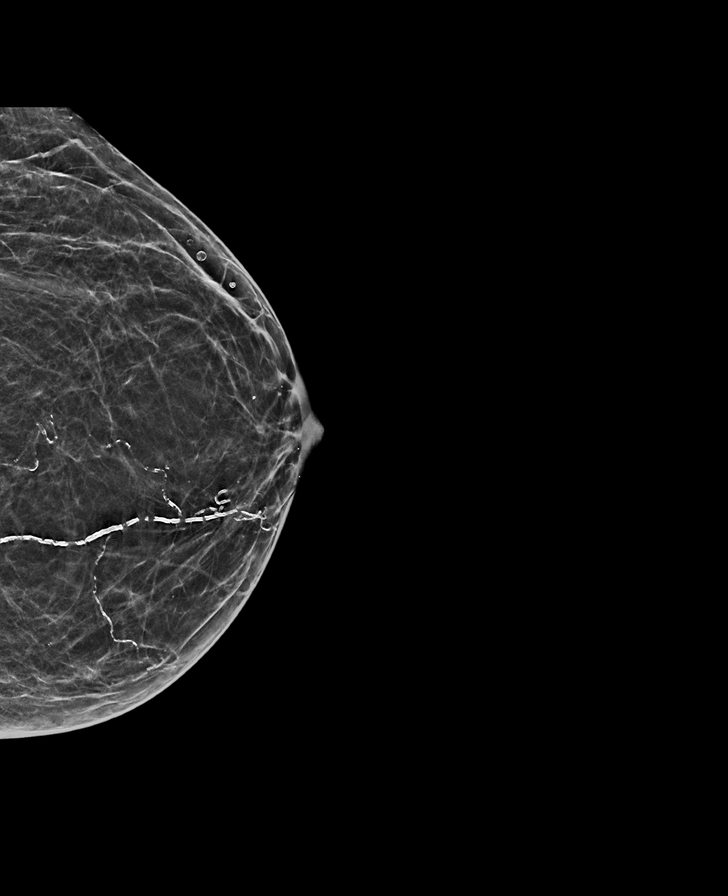

[L CC]
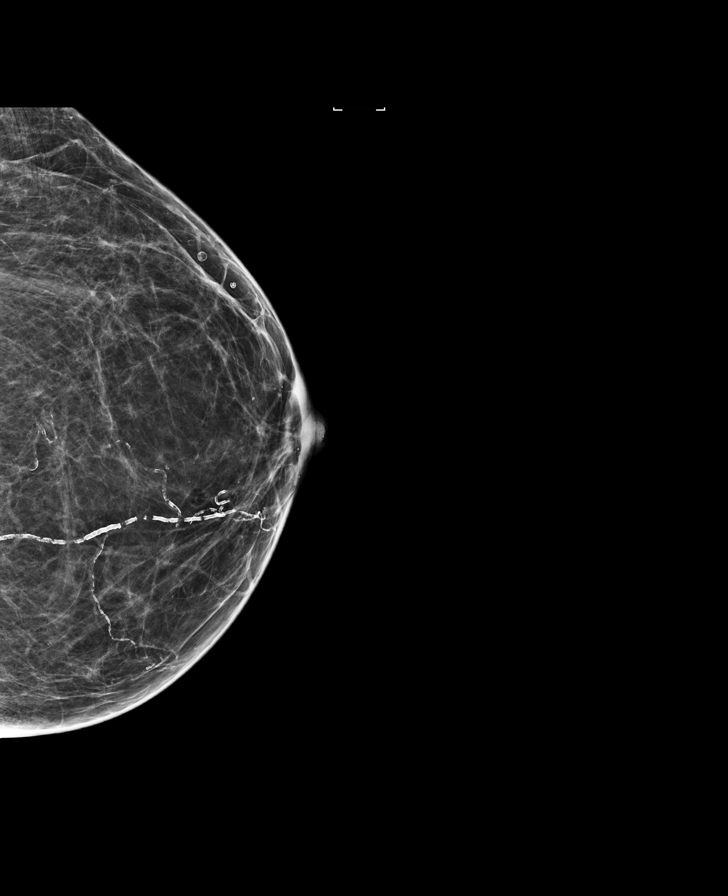

[8 of 36 positions shown; findings below may reference images not displayed]

ACR Breast Density Category b: There are scattered areas of
fibroglandular density.
FINDINGS: No suspicious masses or calcifications are seen in either breast.
Postsurgical changes are present in the lower central posterior
right breast related to prior lumpectomy. Spot compression
magnification MLO view of the lumpectomy site in the right breast
was performed. There is no mammographic evidence of locally
recurrent malignancy.

Mammographic images were processed with CAD.
IMPRESSION: No mammographic evidence of malignancy in either breast.

RECOMMENDATION:
Diagnostic mammogram is suggested in 1 year. (Code:[ZO])

I have discussed the findings and recommendations with the patient.
Results were also provided in writing at the conclusion of the
visit. If applicable, a reminder letter will be sent to the patient
regarding the next appointment.

BI-RADS CATEGORY  2: Benign.

## 2017-11-20 NOTE — Progress Notes (Signed)
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  INTERVAL HISTORY: Patient returns to clinic for 6 month follow up of breast cancer. She currently feels well and is asymptomatic. She continues to tolerate letrozole without significant side effects. She has no neurologic complaints. She denies any recent fevers or illnesses. She has a good appetite and denies weight loss. She denies any pain. She denies any chest pain or shortness of breath. She has no nausea, vomiting, constipation, or diarrhea. She has no urinary complaints. Patient feels at her baseline and offers no specific complaints today.    REVIEW OF SYSTEMS:   Review of Systems  Constitutional: Negative.  Negative for fever, malaise/fatigue and weight loss.  Respiratory: Negative.  Negative for cough and shortness of breath.   Cardiovascular: Negative.  Negative for chest pain and leg swelling.  Gastrointestinal: Negative.  Negative for abdominal pain.  Genitourinary: Negative.   Musculoskeletal: Negative.   Skin: Negative.  Negative for rash.  Neurological: Negative.  Negative for sensory change and weakness.  Psychiatric/Behavioral: Negative.  The patient is not nervous/anxious.     As per HPI. Otherwise, a complete review of systems is negative.   PAST MEDICAL HISTORY: Past Medical History:  Diagnosis Date  . Cancer [facility])    breast  . Carcinoma of right breast ([place]) 05/20/2014  . Diabetes mellitus without complication ([place])   . Hypertension   . Lymphedema   . Personal history of radiation therapy   . PONV (postoperative nausea and vomiting)   . Psoriasis 1990  .  Stroke ([place]) 2000   residual left arm weakness  . Varicose veins     PAST SURGICAL HISTORY: Past Surgical History:  Procedure Laterality Date  . AMPUTATION TOE Right 01/25/2017   Procedure: AMPUTATION TOE   ;  Surgeon: Albertine Patricia, DPM;  Location: [facility];  Service: Podiatry;  Laterality: Right;  . BREAST EXCISIONAL BIOPSY Right 2015   positive, with radiation  . BREAST LUMPECTOMY Right 04-2014   followed by radiation,  no chemo  . BREAST REDUCTION SURGERY  1977  . CHOLECYSTECTOMY N/A 09/23/2015   Procedure: LAPAROSCOPIC CHOLECYSTECTOMY;  Surgeon: Bonner Puna, MD;  Location: [facility];  Service: General;  Laterality: N/A;  . DILATION AND CURETTAGE OF UTERUS  2010  . EYE SURGERY Left 2015   cataracts and left eye detached retina repair  . EYE SURGERY Right 2013   cataract with len implant  . GASTRIC ROUX-EN-Y N/A 09/23/2015   Procedure: LAPAROSCOPIC ROUX-EN-Y GASTRIC;  Surgeon: Bonner Puna, MD;  Location: [facility];  Service: General;  Laterality: N/A;  . HIATAL HERNIA REPAIR N/A 09/23/2015   Procedure: LAPAROSCOPIC REPAIR OF HIATAL HERNIA;  Surgeon: Bonner Puna, MD;  Location: [facility];  Service: General;  Laterality: N/A;  . IRRIGATION AND DEBRIDEMENT FOOT Right 01/25/2017   Procedure: IRRIGATION AND DEBRIDEMENT FOOT;  Surgeon: Albertine Patricia, DPM;  Location: [facility];  Service: Podiatry;  Laterality: Right;  . IRRIGATION AND DEBRIDEMENT FOOT Right 01/29/2017   Procedure: IRRIGATION AND DEBRIDEMENT FOOT and application of wound vac;  Surgeon: Albertine Patricia, DPM;  Location: [facility];  Service: Podiatry;  Laterality: Right;  .  LOWER EXTREMITY ANGIOGRAPHY Right 01/28/2017   Procedure: Lower Extremity Angiography;  Surgeon: Katha Cabal, MD;  Location: [facility];  Service: Cardiovascular;  Laterality: Right;  . LOWER EXTREMITY INTERVENTION  01/28/2017   Procedure: Lower Extremity Intervention;  Surgeon: Katha Cabal, MD;  Location: [facility];  Service:  Cardiovascular;;  . REDUCTION MAMMAPLASTY      FAMILY HISTORY Family History  Problem Relation Age of Onset  . Diabetes Mother   . Cancer Mother   . Breast cancer Mother 79  . Diabetes Father   . Diabetes Sister   . Diabetes Brother     GYNECOLOGIC HISTORY:  No LMP recorded. Patient is postmenopausal.     ADVANCED DIRECTIVES:    HEALTH MAINTENANCE: Social History   Tobacco Use  . Smoking status: Never Smoker  . Smokeless tobacco: Never Used  Substance Use Topics  . Alcohol use: No    Alcohol/week: 0.0 oz  . Drug use: No    No Known Allergies  Current Outpatient Medications  Medication Sig Dispense Refill  . albuterol (PROVENTIL HFA;VENTOLIN HFA) 108 (90 BASE) MCG/ACT inhaler Inhale 2 puffs into the lungs every 6 (six) hours as needed for wheezing or shortness of breath.    [name] atenolol (TENORMIN) 50 MG tablet Take 50 mg by mouth daily. Reported on 05/25/2016    . calcium-vitamin D (OSCAL WITH D) 500-200 MG-UNIT tablet Take 2 tablets by mouth daily with breakfast. 180 tablet 3  . clopidogrel (PLAVIX) 75 MG tablet Take 75 mg by mouth.    . furosemide (LASIX) 20 MG tablet Take 20 mg by mouth daily.    [name] gabapentin (NEURONTIN) 100 MG capsule Take 300 mg by mouth 3 (three) times daily.    [name] letrozole (FEMARA) 2.5 MG tablet Take 1 tablet (2.5 mg total) by mouth daily. 90 tablet 3  . linagliptin (TRADJENTA) 5 MG TABS tablet Take 5 mg by mouth daily.    [name] lisinopril (PRINIVIL,ZESTRIL) 10 MG tablet Take 10 mg by mouth daily.    . metFORMIN (GLUCOPHAGE) 1000 MG tablet Take 1,000 mg by mouth 2 (two) times daily with a meal.    . pravastatin (PRAVACHOL) 40 MG tablet Take 40 mg by mouth daily. In afternoon     No current facility-administered medications for this visit.     OBJECTIVE: BP (!) 174/84   Pulse 68   Temp (!) 96.6 F (35.9 C) (Tympanic)   Resp 20   Wt 197 lb 12.8 oz (89.7 kg)   BMI 35.04 kg/m    Body mass index is 35.04 kg/m.    ECOG FS:0 -  Asymptomatic  General: Well-developed, well-nourished, no acute distress. Eyes: Pink conjunctiva, anicteric sclera. Breasts: Bilateral breast and axilla without lumps or masses. Lungs: Clear to auscultation bilaterally. Heart: Regular rate and rhythm. No rubs, murmurs, or gallops. Abdomen: Soft, nontender, nondistended. No organomegaly noted, normoactive bowel sounds. Breast: Patient declined breast exam. Musculoskeletal: No edema, cyanosis, or clubbing. Neuro: Alert, answering all questions appropriately. Cranial nerves grossly intact. Skin: No rashes or petechiae noted. Psych: Normal affect.   LAB RESULTS:  No visits with results within 3 Day(s) from this visit.  Latest known visit with results is:  Admission on 01/22/2017, Discharged on 02/01/2017  No results displayed because visit has over 200 results.      STUDIES: No results found.  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   &  PLAN:    1. 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 Continue letrozole for total 5 years completing in July 2020. Her most recent mammogram on May 26, 2017 was reported as BI-RADS 2. Repeat in July 2019. Return to clinic in 6 months for further evaluation. 2. Postmenopausal: Patient's most recent bone mineral density on May 26, 2017 reported T score of -0.1 which is normal and unchanged from one year prior. Repeat in 2 years in June 2020.   Approximately 20 minutes was spent in discussion of which greater than 50% was consultation.  Patient expressed understanding and was in agreement with this plan. She also understands that She can call clinic at any time with any questions, concerns, or complaints.    Lloyd Huger, MD   11/23/2017 1:03  PM

## 2017-11-21 ENCOUNTER — Encounter: Payer: Self-pay | Admitting: Oncology

## 2017-11-21 ENCOUNTER — Other Ambulatory Visit: Payer: Self-pay

## 2017-11-21 ENCOUNTER — Inpatient Hospital Stay: Payer: Medicare HMO | Attending: Oncology | Admitting: Oncology

## 2017-11-21 VITALS — BP 174/84 | HR 68 | Temp 96.6°F | Resp 20 | Wt 197.8 lb

## 2017-11-21 DIAGNOSIS — C50511 Malignant neoplasm of lower-outer quadrant of right female breast: Secondary | ICD-10-CM | POA: Insufficient documentation

## 2017-11-21 DIAGNOSIS — Z9049 Acquired absence of other specified parts of digestive tract: Secondary | ICD-10-CM | POA: Diagnosis not present

## 2017-11-21 DIAGNOSIS — Z8673 Personal history of transient ischemic attack (TIA), and cerebral infarction without residual deficits: Secondary | ICD-10-CM | POA: Insufficient documentation

## 2017-11-21 DIAGNOSIS — Z170 Estrogen receptor positive status [ER+]: Secondary | ICD-10-CM | POA: Diagnosis not present

## 2017-11-21 DIAGNOSIS — E119 Type 2 diabetes mellitus without complications: Secondary | ICD-10-CM | POA: Diagnosis not present

## 2017-11-21 DIAGNOSIS — I890 Lymphedema, not elsewhere classified: Secondary | ICD-10-CM | POA: Diagnosis not present

## 2017-11-21 DIAGNOSIS — L409 Psoriasis, unspecified: Secondary | ICD-10-CM | POA: Diagnosis not present

## 2017-11-21 DIAGNOSIS — Z7984 Long term (current) use of oral hypoglycemic drugs: Secondary | ICD-10-CM | POA: Diagnosis not present

## 2017-11-21 DIAGNOSIS — Z79899 Other long term (current) drug therapy: Secondary | ICD-10-CM | POA: Diagnosis not present

## 2017-11-21 DIAGNOSIS — I10 Essential (primary) hypertension: Secondary | ICD-10-CM | POA: Diagnosis not present

## 2017-11-21 DIAGNOSIS — Z89421 Acquired absence of other right toe(s): Secondary | ICD-10-CM

## 2017-11-21 DIAGNOSIS — I8390 Asymptomatic varicose veins of unspecified lower extremity: Secondary | ICD-10-CM | POA: Diagnosis not present

## 2017-11-21 DIAGNOSIS — Z79811 Long term (current) use of aromatase inhibitors: Secondary | ICD-10-CM | POA: Diagnosis not present

## 2017-11-21 DIAGNOSIS — Z803 Family history of malignant neoplasm of breast: Secondary | ICD-10-CM | POA: Insufficient documentation

## 2017-11-21 MED ORDER — LETROZOLE 2.5 MG PO TABS: 2.5000 mg | ORAL_TABLET | Freq: Every day | ORAL | 3 refills | Status: DC

## 2017-11-21 NOTE — Progress Notes (Signed)
Patient denies any concerns today.  

## 2018-05-09 ENCOUNTER — Other Ambulatory Visit: Payer: Self-pay

## 2018-05-09 ENCOUNTER — Encounter: Payer: Self-pay | Admitting: Emergency Medicine

## 2018-05-09 DIAGNOSIS — Z79811 Long term (current) use of aromatase inhibitors: Secondary | ICD-10-CM

## 2018-05-09 DIAGNOSIS — A419 Sepsis, unspecified organism: Principal | ICD-10-CM | POA: Diagnosis present

## 2018-05-09 DIAGNOSIS — E872 Acidosis: Secondary | ICD-10-CM | POA: Diagnosis present

## 2018-05-09 DIAGNOSIS — L8990 Pressure ulcer of unspecified site, unspecified stage: Secondary | ICD-10-CM | POA: Diagnosis present

## 2018-05-09 DIAGNOSIS — Z7984 Long term (current) use of oral hypoglycemic drugs: Secondary | ICD-10-CM

## 2018-05-09 DIAGNOSIS — Z7902 Long term (current) use of antithrombotics/antiplatelets: Secondary | ICD-10-CM

## 2018-05-09 DIAGNOSIS — Z833 Family history of diabetes mellitus: Secondary | ICD-10-CM

## 2018-05-09 DIAGNOSIS — Z853 Personal history of malignant neoplasm of breast: Secondary | ICD-10-CM

## 2018-05-09 DIAGNOSIS — Z923 Personal history of irradiation: Secondary | ICD-10-CM

## 2018-05-09 DIAGNOSIS — Z89421 Acquired absence of other right toe(s): Secondary | ICD-10-CM

## 2018-05-09 DIAGNOSIS — Z8673 Personal history of transient ischemic attack (TIA), and cerebral infarction without residual deficits: Secondary | ICD-10-CM

## 2018-05-09 DIAGNOSIS — E119 Type 2 diabetes mellitus without complications: Secondary | ICD-10-CM | POA: Diagnosis present

## 2018-05-09 DIAGNOSIS — N390 Urinary tract infection, site not specified: Secondary | ICD-10-CM | POA: Diagnosis present

## 2018-05-09 DIAGNOSIS — L03115 Cellulitis of right lower limb: Secondary | ICD-10-CM | POA: Diagnosis present

## 2018-05-09 DIAGNOSIS — Z803 Family history of malignant neoplasm of breast: Secondary | ICD-10-CM

## 2018-05-09 DIAGNOSIS — E785 Hyperlipidemia, unspecified: Secondary | ICD-10-CM | POA: Diagnosis present

## 2018-05-09 DIAGNOSIS — I10 Essential (primary) hypertension: Secondary | ICD-10-CM | POA: Diagnosis present

## 2018-05-09 DIAGNOSIS — Z79899 Other long term (current) drug therapy: Secondary | ICD-10-CM

## 2018-05-09 LAB — CBC
HCT: 35 % (ref 35.0–47.0)
Hemoglobin: 11.6 g/dL — ABNORMAL LOW (ref 12.0–16.0)
MCH: 28.5 pg (ref 26.0–34.0)
MCHC: 33.3 g/dL (ref 32.0–36.0)
MCV: 85.6 fL (ref 80.0–100.0)
PLATELETS: 192 10*3/uL (ref 150–440)
RBC: 4.08 MIL/uL (ref 3.80–5.20)
RDW: 13 % (ref 11.5–14.5)
WBC: 17.8 10*3/uL — ABNORMAL HIGH (ref 3.6–11.0)

## 2018-05-09 LAB — BASIC METABOLIC PANEL
Anion gap: 11 (ref 5–15)
BUN: 27 mg/dL — ABNORMAL HIGH (ref 8–23)
CALCIUM: 8.6 mg/dL — AB (ref 8.9–10.3)
CHLORIDE: 103 mmol/L (ref 98–111)
CO2: 22 mmol/L (ref 22–32)
CREATININE: 1.27 mg/dL — AB (ref 0.44–1.00)
GFR calc Af Amer: 50 mL/min — ABNORMAL LOW (ref >60)
GFR calc non Af Amer: 43 mL/min — ABNORMAL LOW (ref >60)
GLUCOSE: 229 mg/dL — AB (ref 70–99)
Potassium: 4.1 mmol/L (ref 3.5–5.1)
Sodium: 136 mmol/L (ref 135–145)

## 2018-05-09 LAB — TROPONIN I: TROPONIN I: 0.03 ng/mL — AB (ref <0.03)

## 2018-05-09 NOTE — ED Triage Notes (Addendum)
Pt to triage via w/c with no distress noted; pt reports weakness, "off-balance" since yesterday; denies any c/o pain, denies any numbness/tingling; denies any falls; A&Ox3, speech clear, MAEW; denies any recent illness

## 2018-05-09 NOTE — ED Notes (Signed)
Date and time results received: 05/09/18 now  (use smartphrase ".now" to insert current time)  Test: troponin Critical Value: 0.03 Name of Provider Notified: [place]  Orders Received? Or Actions Taken?:

## 2018-05-10 ENCOUNTER — Other Ambulatory Visit: Payer: Self-pay

## 2018-05-10 ENCOUNTER — Inpatient Hospital Stay
Admission: EM | Admit: 2018-05-10 | Discharge: 2018-05-11 | DRG: 872 | Disposition: A | Discharge status: Home Health | Payer: Medicare Other | Attending: Internal Medicine | Admitting: Internal Medicine

## 2018-05-10 ENCOUNTER — Emergency Department: Payer: Medicare Other

## 2018-05-10 DIAGNOSIS — E785 Hyperlipidemia, unspecified: Secondary | ICD-10-CM | POA: Diagnosis not present

## 2018-05-10 DIAGNOSIS — I10 Essential (primary) hypertension: Secondary | ICD-10-CM | POA: Diagnosis not present

## 2018-05-10 DIAGNOSIS — Z833 Family history of diabetes mellitus: Secondary | ICD-10-CM | POA: Diagnosis not present

## 2018-05-10 DIAGNOSIS — Z79811 Long term (current) use of aromatase inhibitors: Secondary | ICD-10-CM | POA: Diagnosis not present

## 2018-05-10 DIAGNOSIS — L8990 Pressure ulcer of unspecified site, unspecified stage: Secondary | ICD-10-CM

## 2018-05-10 DIAGNOSIS — A419 Sepsis, unspecified organism: Secondary | ICD-10-CM | POA: Diagnosis not present

## 2018-05-10 DIAGNOSIS — Z853 Personal history of malignant neoplasm of breast: Secondary | ICD-10-CM | POA: Diagnosis not present

## 2018-05-10 DIAGNOSIS — Z89421 Acquired absence of other right toe(s): Secondary | ICD-10-CM | POA: Diagnosis not present

## 2018-05-10 DIAGNOSIS — Z803 Family history of malignant neoplasm of breast: Secondary | ICD-10-CM | POA: Diagnosis not present

## 2018-05-10 DIAGNOSIS — L03115 Cellulitis of right lower limb: Secondary | ICD-10-CM | POA: Diagnosis not present

## 2018-05-10 DIAGNOSIS — N390 Urinary tract infection, site not specified: Secondary | ICD-10-CM | POA: Diagnosis not present

## 2018-05-10 DIAGNOSIS — Z7902 Long term (current) use of antithrombotics/antiplatelets: Secondary | ICD-10-CM | POA: Diagnosis not present

## 2018-05-10 DIAGNOSIS — Z923 Personal history of irradiation: Secondary | ICD-10-CM | POA: Diagnosis not present

## 2018-05-10 DIAGNOSIS — E119 Type 2 diabetes mellitus without complications: Secondary | ICD-10-CM | POA: Diagnosis not present

## 2018-05-10 DIAGNOSIS — R531 Weakness: Secondary | ICD-10-CM | POA: Diagnosis present

## 2018-05-10 DIAGNOSIS — Z8673 Personal history of transient ischemic attack (TIA), and cerebral infarction without residual deficits: Secondary | ICD-10-CM | POA: Diagnosis not present

## 2018-05-10 DIAGNOSIS — Z79899 Other long term (current) drug therapy: Secondary | ICD-10-CM | POA: Diagnosis not present

## 2018-05-10 DIAGNOSIS — E872 Acidosis: Secondary | ICD-10-CM | POA: Diagnosis not present

## 2018-05-10 DIAGNOSIS — Z7984 Long term (current) use of oral hypoglycemic drugs: Secondary | ICD-10-CM | POA: Diagnosis not present

## 2018-05-10 LAB — GLUCOSE, CAPILLARY
Glucose-Capillary: 203 mg/dL — ABNORMAL HIGH (ref 70–99)
Glucose-Capillary: 142 mg/dL — ABNORMAL HIGH (ref 70–99)
Glucose-Capillary: 158 mg/dL — ABNORMAL HIGH (ref 70–99)
Glucose-Capillary: 126 mg/dL — ABNORMAL HIGH (ref 70–99)

## 2018-05-10 LAB — URINALYSIS, COMPLETE (UACMP) WITH MICROSCOPIC
BILIRUBIN URINE: NEGATIVE
Glucose, UA: NEGATIVE mg/dL
KETONES UR: NEGATIVE mg/dL
NITRITE: POSITIVE — AB
PROTEIN: 100 mg/dL — AB
Specific Gravity, Urine: 1.023 (ref 1.005–1.030)
pH: 5 (ref 5.0–8.0)

## 2018-05-10 LAB — HEMOGLOBIN A1C
Hgb A1c MFr Bld: 6.5 % — ABNORMAL HIGH (ref 4.8–5.6)
Mean Plasma Glucose: 139.85 mg/dL

## 2018-05-10 LAB — LACTIC ACID, PLASMA
Lactic Acid, Venous: 3.1 mmol/L (ref 0.5–1.9)
Lactic Acid, Venous: 2.4 mmol/L (ref 0.5–1.9)
Lactic Acid, Venous: 1.3 mmol/L (ref 0.5–1.9)

## 2018-05-10 LAB — TSH: TSH: 0.648 u[IU]/mL (ref 0.350–4.500)

## 2018-05-10 IMAGING — CT CT HEAD W/O CM
3 series · 15 of 46 positions shown, 18 images · non-contrast
Comparison: [DATE]

CLINICAL DATA: Weakness and off balance since yesterday. History of
breast cancer and stroke. Altered level of consciousness.

EXAM:
CT HEAD WITHOUT CONTRAST
TECHNIQUE: Contiguous axial images were obtained from the base of the skull
through the vertex without intravenous contrast.

[Series 2: head wo · axial · 0.46mm/px · z∈[-61,+59]mm · 9 of 29 slices shown, 12 images]
[im 3/29  brain]
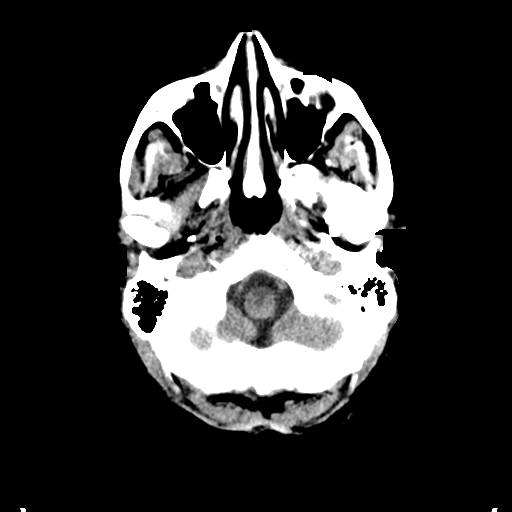
[im 3/29  bone]
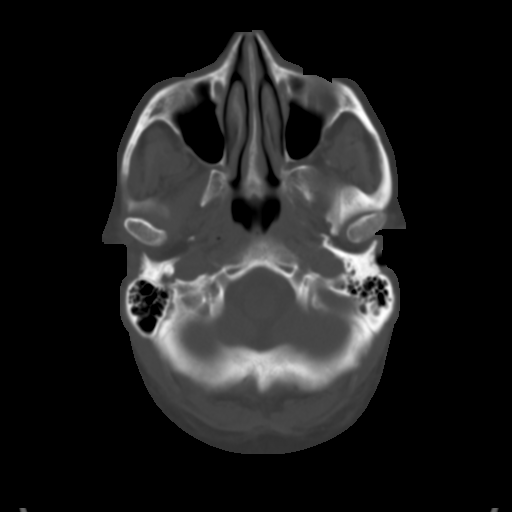
[im 6/29  brain]
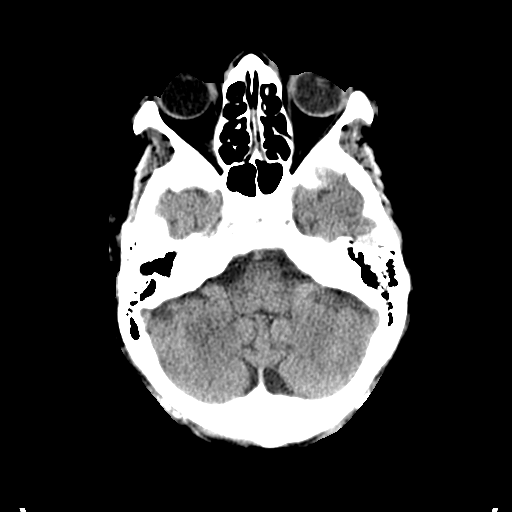
[im 9/29  brain]
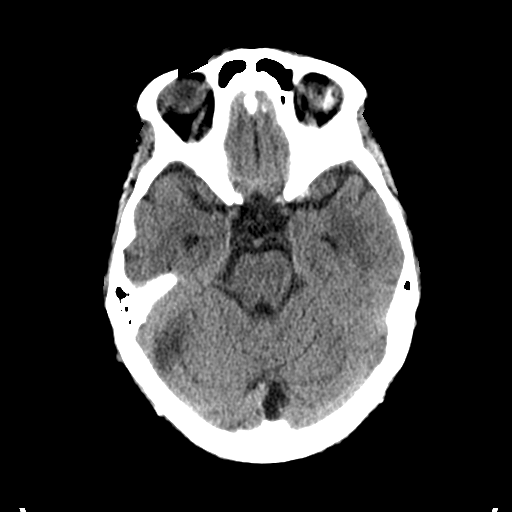
[im 12/29  brain]
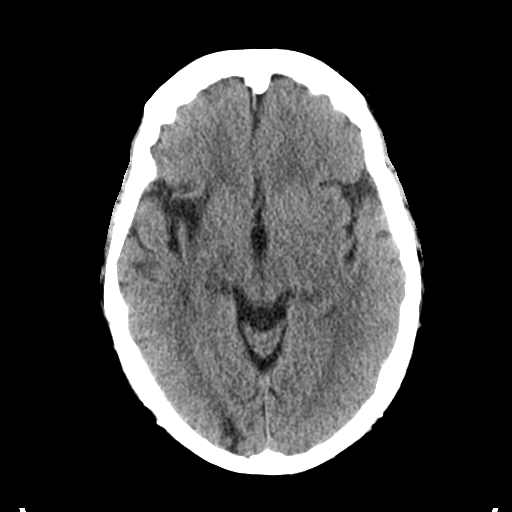
[im 15/29  brain]
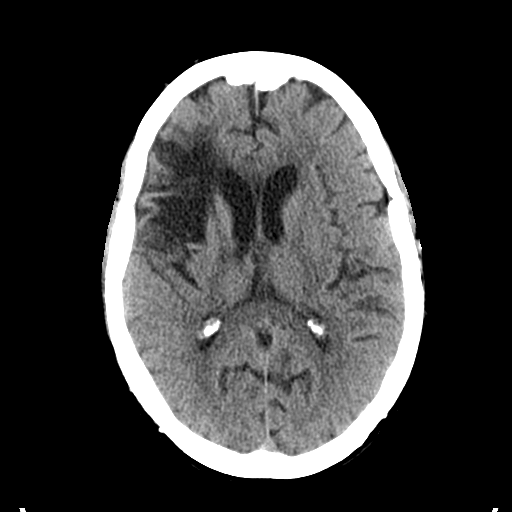
[im 15/29  bone]
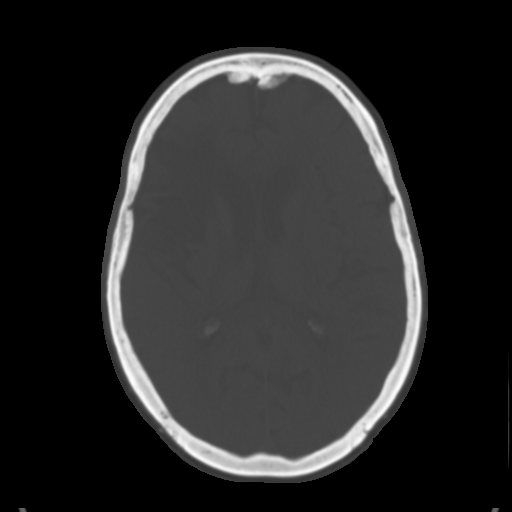
[im 18/29  brain]
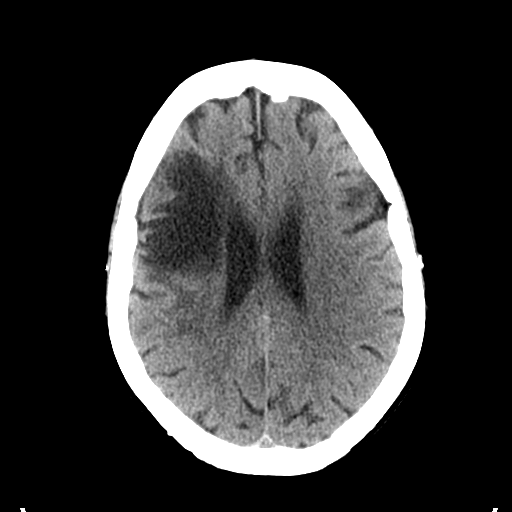
[im 21/29  brain]
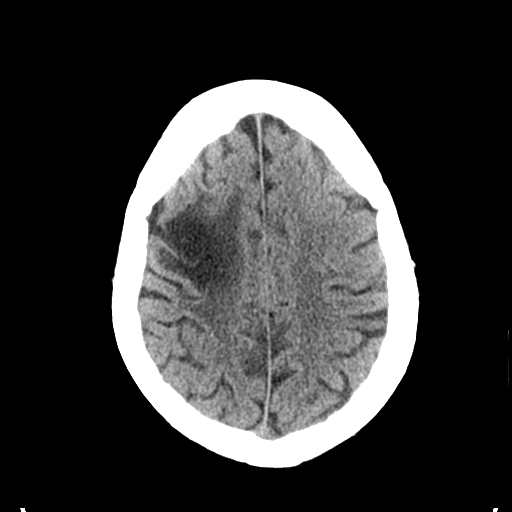
[im 24/29  brain]
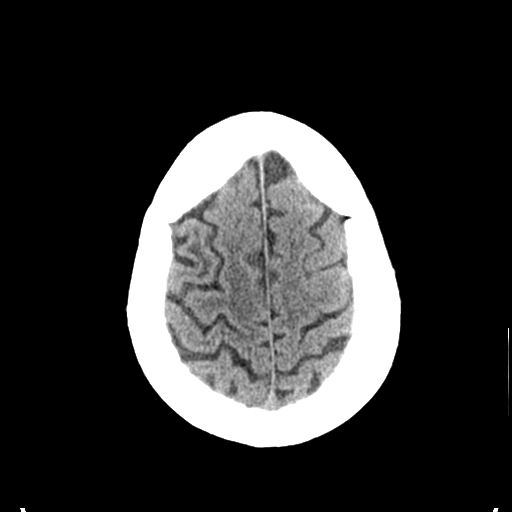
[im 27/29  brain]
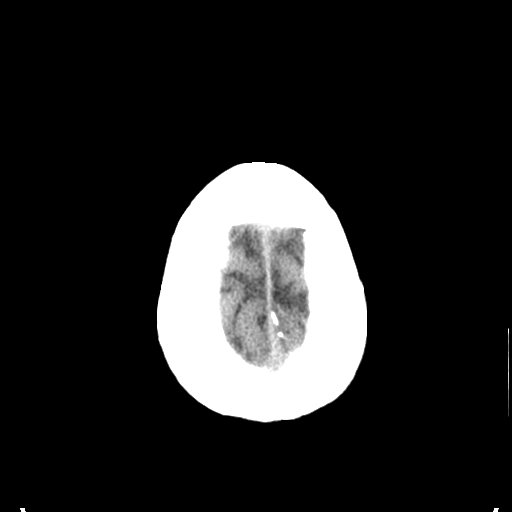
[im 27/29  bone]
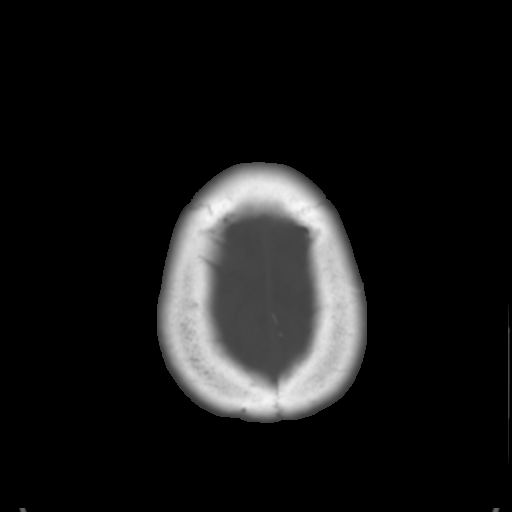

[Series 4: coronal soft tissue · coronal · 0.29mm/px · 3 of 63 slices shown]
[im 21/63  brain]
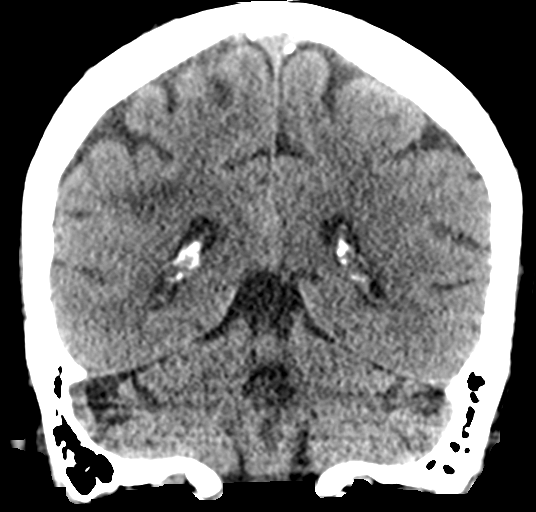
[im 28/63  brain]
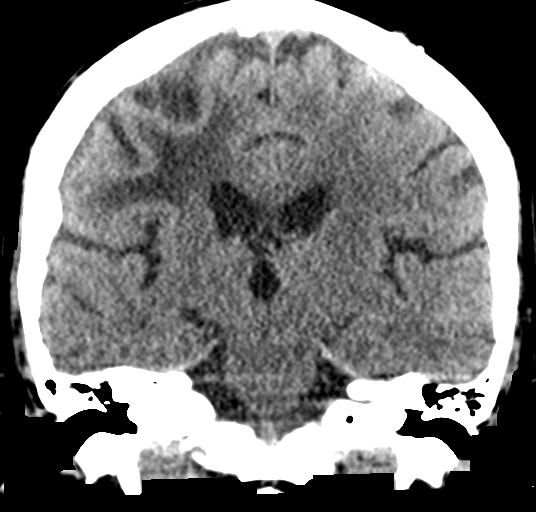
[im 35/63  brain]
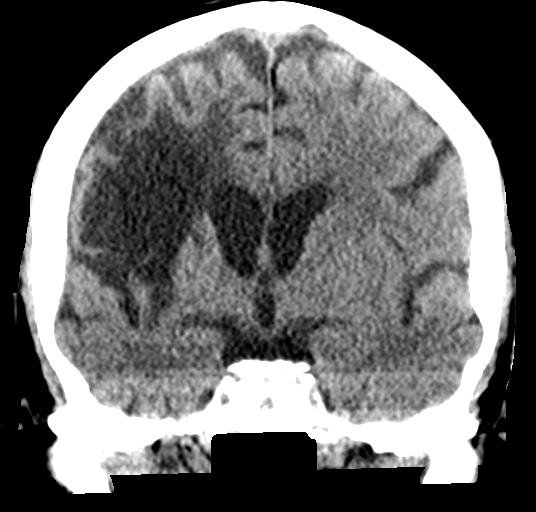

[Series 5: sagittal soft tissue · sagittal · 0.29mm/px · 3 of 49 slices shown]
[im 17/49  brain]
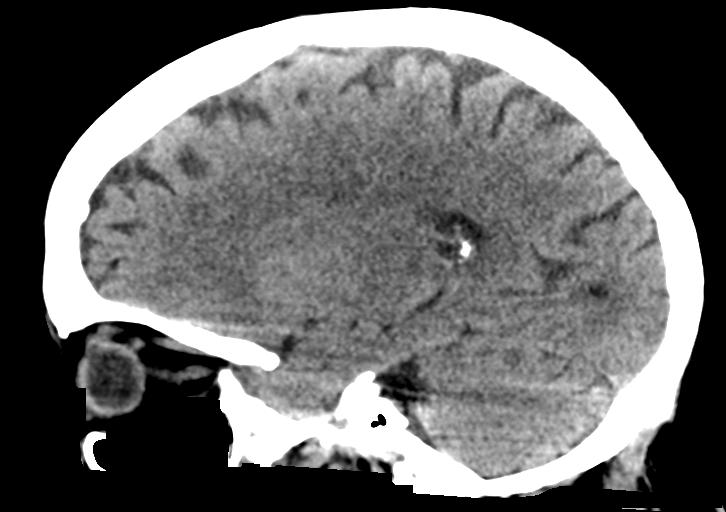
[im 25/49  brain]
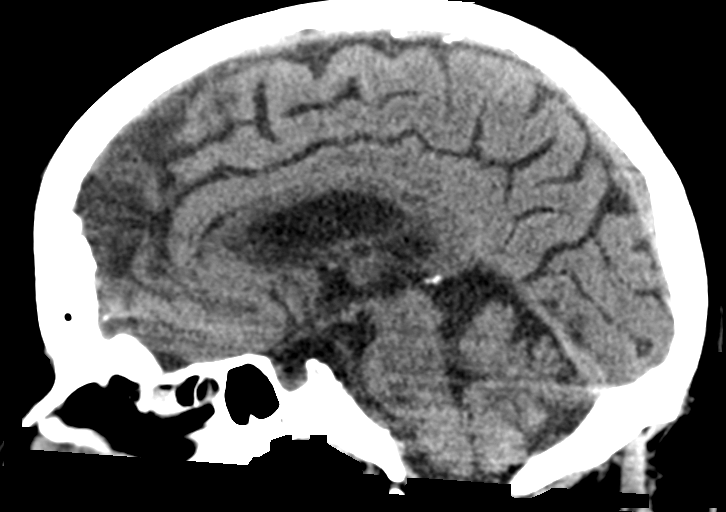
[im 33/49  brain]
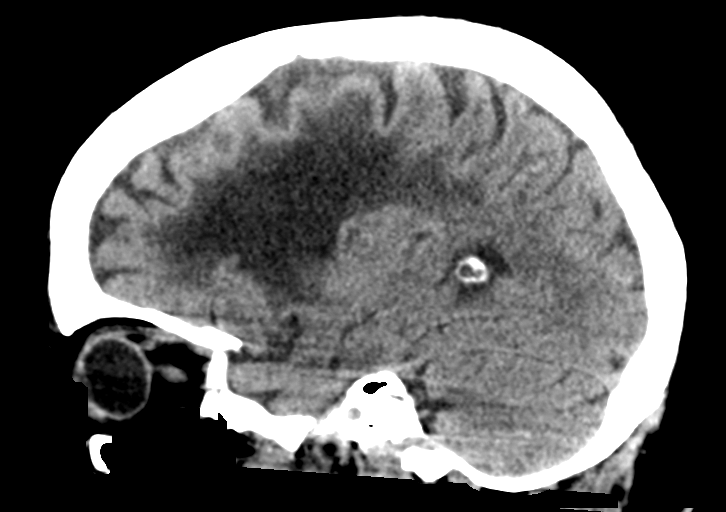

[15 of 46 positions shown; findings below may reference images not displayed]

FINDINGS: Brain: Large focal area of encephalomalacia involving the right
frontal lobe with smaller focal areas of encephalomalacia in the
right parietal and right occipital lobes. This would indicate old
infarcts in the distribution of the right middle cerebral and
posterior cerebral arteries. No significant progression since
previous study. Mild cerebral atrophy. No significant ventricular
dilatation. Low-attenuation changes in the deep white matter are
consistent with small vessel ischemic change. No mass effect or
midline shift. No abnormal extra-axial fluid collections. Basal
cisterns are not effaced. No acute intracranial hemorrhage.

Vascular: Intracranial arterial vascular calcifications are present.

Skull: The calvarium appears intact. No acute depressed fractures
identified.

Sinuses/Orbits: Paranasal sinuses and mastoid air cells are clear.

Other: None.
IMPRESSION: No acute intracranial abnormalities. Chronic atrophy and small
vessel ischemic changes. Old infarcts in the right frontal, parietal
and right occipital lobes.

## 2018-05-10 MED ORDER — SODIUM CHLORIDE 0.9 % IV BOLUS
1000.0000 mL | Freq: Once | INTRAVENOUS | Status: AC
  Administered 2018-05-10: 1000 mL via INTRAVENOUS

## 2018-05-10 MED ORDER — PRAVASTATIN SODIUM 40 MG PO TABS
40.0000 mg | ORAL_TABLET | Freq: Every day | ORAL | Status: DC
  Administered 2018-05-10: 40 mg via ORAL
  Filled 2018-05-10: qty 1

## 2018-05-10 MED ORDER — SODIUM CHLORIDE 0.9 % IV SOLN
INTRAVENOUS | Status: DC
  Administered 2018-05-10 – 2018-05-11 (×3): via INTRAVENOUS

## 2018-05-10 MED ORDER — ACETAMINOPHEN 650 MG RE SUPP: 650.0000 mg | Freq: Four times a day (QID) | RECTAL | Status: DC | PRN

## 2018-05-10 MED ORDER — PIPERACILLIN-TAZOBACTAM 3.375 G IVPB 30 MIN
3.3750 g | Freq: Once | INTRAVENOUS | Status: AC
  Administered 2018-05-10: 3.375 g via INTRAVENOUS
  Filled 2018-05-10: qty 50

## 2018-05-10 MED ORDER — GABAPENTIN 300 MG PO CAPS
300.0000 mg | ORAL_CAPSULE | Freq: Three times a day (TID) | ORAL | Status: DC
  Administered 2018-05-10 – 2018-05-11 (×4): 300 mg via ORAL
  Filled 2018-05-10 (×4): qty 1

## 2018-05-10 MED ORDER — ONDANSETRON HCL 4 MG PO TABS: 4.0000 mg | ORAL_TABLET | Freq: Four times a day (QID) | ORAL | Status: DC | PRN

## 2018-05-10 MED ORDER — ONDANSETRON HCL 4 MG/2ML IJ SOLN
4.0000 mg | Freq: Four times a day (QID) | INTRAMUSCULAR | Status: DC | PRN
Start: 2018-05-10 — End: 2018-05-11

## 2018-05-10 MED ORDER — ATENOLOL 50 MG PO TABS
50.0000 mg | ORAL_TABLET | Freq: Every day | ORAL | Status: DC
  Administered 2018-05-10 – 2018-05-11 (×2): 50 mg via ORAL
  Filled 2018-05-10 (×2): qty 1

## 2018-05-10 MED ORDER — PIPERACILLIN-TAZOBACTAM 3.375 G IVPB
INTRAVENOUS | Status: AC
  Administered 2018-05-10: 3.375 g via INTRAVENOUS
  Filled 2018-05-10: qty 50

## 2018-05-10 MED ORDER — LISINOPRIL 20 MG PO TABS
20.0000 mg | ORAL_TABLET | Freq: Every day | ORAL | Status: DC
  Administered 2018-05-10 – 2018-05-11 (×2): 20 mg via ORAL
  Filled 2018-05-10 (×2): qty 1

## 2018-05-10 MED ORDER — FUROSEMIDE 20 MG PO TABS
20.0000 mg | ORAL_TABLET | Freq: Every day | ORAL | Status: DC
  Administered 2018-05-10 – 2018-05-11 (×2): 20 mg via ORAL
  Filled 2018-05-10 (×2): qty 1

## 2018-05-10 MED ORDER — INSULIN ASPART 100 UNIT/ML ~~LOC~~ SOLN: 0.0000 [IU] | Freq: Every day | SUBCUTANEOUS | Status: DC

## 2018-05-10 MED ORDER — INSULIN ASPART 100 UNIT/ML ~~LOC~~ SOLN
0.0000 [IU] | Freq: Three times a day (TID) | SUBCUTANEOUS | Status: DC
  Administered 2018-05-10: 3 [IU] via SUBCUTANEOUS
  Administered 2018-05-10: 2 [IU] via SUBCUTANEOUS
  Administered 2018-05-10: 1 [IU] via SUBCUTANEOUS
  Filled 2018-05-10 (×3): qty 1

## 2018-05-10 MED ORDER — CALCIUM CARBONATE-VITAMIN D 500-200 MG-UNIT PO TABS
2.0000 | ORAL_TABLET | Freq: Every day | ORAL | Status: DC
  Administered 2018-05-10 – 2018-05-11 (×2): 2 via ORAL
  Filled 2018-05-10 (×2): qty 2

## 2018-05-10 MED ORDER — VANCOMYCIN HCL 10 G IV SOLR
1250.0000 mg | INTRAVENOUS | Status: DC
  Administered 2018-05-10: 1250 mg via INTRAVENOUS
  Filled 2018-05-10 (×2): qty 1250

## 2018-05-10 MED ORDER — DOCUSATE SODIUM 100 MG PO CAPS
100.0000 mg | ORAL_CAPSULE | Freq: Two times a day (BID) | ORAL | Status: DC
  Administered 2018-05-10 – 2018-05-11 (×3): 100 mg via ORAL
  Filled 2018-05-10 (×3): qty 1

## 2018-05-10 MED ORDER — LINAGLIPTIN 5 MG PO TABS
5.0000 mg | ORAL_TABLET | Freq: Every day | ORAL | Status: DC
  Administered 2018-05-10 – 2018-05-11 (×2): 5 mg via ORAL
  Filled 2018-05-10 (×2): qty 1

## 2018-05-10 MED ORDER — ACETAMINOPHEN 325 MG PO TABS: 650.0000 mg | ORAL_TABLET | Freq: Four times a day (QID) | ORAL | Status: DC | PRN

## 2018-05-10 MED ORDER — PIPERACILLIN-TAZOBACTAM 3.375 G IVPB
3.3750 g | Freq: Three times a day (TID) | INTRAVENOUS | Status: DC
  Administered 2018-05-10: 3.375 g via INTRAVENOUS

## 2018-05-10 MED ORDER — CLOPIDOGREL BISULFATE 75 MG PO TABS
75.0000 mg | ORAL_TABLET | Freq: Every day | ORAL | Status: DC
  Administered 2018-05-10 – 2018-05-11 (×2): 75 mg via ORAL
  Filled 2018-05-10 (×2): qty 1

## 2018-05-10 MED ORDER — LETROZOLE 2.5 MG PO TABS
2.5000 mg | ORAL_TABLET | Freq: Every day | ORAL | Status: DC
  Administered 2018-05-10 – 2018-05-11 (×2): 2.5 mg via ORAL
  Filled 2018-05-10 (×2): qty 1

## 2018-05-10 MED ORDER — ENOXAPARIN SODIUM 40 MG/0.4ML ~~LOC~~ SOLN
40.0000 mg | SUBCUTANEOUS | Status: DC
  Administered 2018-05-10 – 2018-05-11 (×2): 40 mg via SUBCUTANEOUS
  Filled 2018-05-10 (×2): qty 0.4

## 2018-05-10 MED ORDER — VANCOMYCIN HCL IN DEXTROSE 1-5 GM/200ML-% IV SOLN
1000.0000 mg | Freq: Once | INTRAVENOUS | Status: AC
  Administered 2018-05-10: 1000 mg via INTRAVENOUS
  Filled 2018-05-10: qty 200

## 2018-05-10 MED ORDER — PIPERACILLIN-TAZOBACTAM 3.375 G IVPB
3.3750 g | Freq: Three times a day (TID) | INTRAVENOUS | Status: DC
  Administered 2018-05-10 – 2018-05-11 (×3): 3.375 g via INTRAVENOUS
  Filled 2018-05-10 (×3): qty 50

## 2018-05-10 NOTE — Progress Notes (Signed)
Advanced Care Plan.  Purpose of Encounter: CODE STATUS. Parties in Attendance: The patient to me. Patient's Decisional Capacity: Yes. Medical Story: This is a 67 year old female with history of hypertension, diabetes, stroke, breast cancer and lymphedema.  She is admitted for admitted for sepsis due to UTI.  She is being treated with antibiotics.  I discussed with the patient about her current condition, prognosis and CODE STATUS.  The patient stated that she want to be resuscitated and intubated but no long-term on machine.  Plan:  Code Status: Full code. Time spent discussing advance care planning: 16-17 minutes.

## 2018-05-10 NOTE — Plan of Care (Signed)
  Problem: Education: Goal: Knowledge of General Education information will improve Outcome: Progressing   Problem: Clinical Measurements: Goal: Will remain free from infection Outcome: Progressing Goal: Diagnostic test results will improve Outcome: Progressing   Problem: Activity: Goal: Risk for activity intolerance will decrease Outcome: Progressing   Problem: Pain Managment: Goal: General experience of comfort will improve Outcome: Progressing   Problem: Safety: Goal: Ability to remain free from injury will improve Outcome: Progressing

## 2018-05-10 NOTE — Progress Notes (Signed)
Pt arrived via stretcher from ED. Pt A&O with no complaints of pain. Telemetry box applied and called to CCMD. Skin assesses. Pt oriented to room and call system.

## 2018-05-10 NOTE — Progress Notes (Signed)
CODE SEPSIS - PHARMACY COMMUNICATION  **Broad Spectrum Antibiotics should be administered within 1 hour of Sepsis diagnosis**  Time Code Sepsis Called/Page Received: 0222  Antibiotics Ordered: vanc/zosyn  Time of 1st antibiotic administration: 0246  Additional action taken by pharmacy:   If necessary, Name of Provider/Nurse Contacted:     Tobie Lords ,PharmD Clinical Pharmacist  05/10/2018  4:44 AM

## 2018-05-10 NOTE — ED Provider Notes (Signed)
[facility] Emergency Department Provider Note ___   First MD Initiated Contact with Patient 05/10/18 [phone]     (approximate)  I have reviewed the triage vital signs and the nursing notes.   HISTORY  Chief Complaint Weakness    HPI Elizabeth [name] is a 67 y.o. female with below list of chronic medical conditions presents to the emergency department with generalized weakness x2 days.  Patient denies any fever at home however was noted to have a fever on arrival to the emergency department.  Patient denies any headache no chest pain or shortness of breath.  Patient denies any cough.  Patient does admit however to urinary frequency and urgency.  Patient denies any abdominal pain no vomiting or diarrhea.   Past Medical History:  Diagnosis Date  . Cancer [facility])    breast  . 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 Diabetes mellitus without complication ([place])   . Hypertension   . Lymphedema   . Personal history of radiation therapy   . PONV (postoperative nausea and vomiting)   . Psoriasis 1990  . Stroke ([place]) 2000   residual left arm weakness  . Varicose veins     Patient Active Problem List   Diagnosis Date Noted  . Sepsis ([place]) 05/10/2018  . 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 Type 2 diabetes mellitus with peripheral vascular disease ([place]) 01/28/2017  . Cellulitis 01/22/2017  . Hypokalemia 01/22/2017  . Morbid obesity ([place]) 09/23/2015  . Diabetes mellitus, type 2 ([place]) 05/21/2015  . Peripheral blood vessel disorder ([place]) 05/21/2015  . 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 ([facility]) 05/21/2015  . Chronic venous insufficiency 02/05/2015  . Mixed incontinence 10/24/2014  . Extreme obesity 10/24/2014  . Hernia, rectovaginal 10/24/2014  . Airway hyperreactivity 02/19/2013  . Cerebral vascular accident ([place]) 02/19/2013  . Clinical depression 02/19/2013  . Diabetes mellitus type 2 in obese ([place]) 02/19/2013  . Colon,  diverticulosis 02/19/2013  . Acid reflux 02/19/2013  . HLD (hyperlipidemia) 02/19/2013  . BP (high blood pressure) 02/19/2013    Past Surgical History:  Procedure Laterality Date  . AMPUTATION TOE Right 01/25/2017   Procedure: AMPUTATION TOE   ;  Surgeon: Albertine Patricia, DPM;  Location: [facility] ORS;  Service: Podiatry;  Laterality: Right;  . BREAST EXCISIONAL BIOPSY Right 2015   positive, with radiation  . BREAST LUMPECTOMY Right 04-2014   followed by radiation,  no chemo  . BREAST REDUCTION SURGERY  1977  . CHOLECYSTECTOMY N/A 09/23/2015   Procedure: LAPAROSCOPIC CHOLECYSTECTOMY;  Surgeon: Bonner Puna, MD;  Location: [facility] ORS;  Service: General;  Laterality: N/A;  . DILATION AND CURETTAGE OF UTERUS  2010  . EYE SURGERY Left 2015   cataracts and left eye detached retina repair  . EYE SURGERY Right 2013   cataract with len implant  . GASTRIC ROUX-EN-Y N/A 09/23/2015   Procedure: LAPAROSCOPIC ROUX-EN-Y GASTRIC;  Surgeon: Bonner Puna, MD;  Location: [facility] ORS;  Service: General;  Laterality: N/A;  . HIATAL HERNIA REPAIR N/A 09/23/2015   Procedure: LAPAROSCOPIC REPAIR OF HIATAL HERNIA;  Surgeon: Bonner Puna, MD;  Location: [facility] ORS;  Service: General;  Laterality: N/A;  . IRRIGATION AND DEBRIDEMENT FOOT Right 01/25/2017   Procedure: IRRIGATION AND DEBRIDEMENT FOOT;  Surgeon: Albertine Patricia, DPM;  Location: [facility] OR[facility];  Service: Podiatry;  Laterality: Right;  . IRRIGATION AND DEBRIDEMENT FOOT Right 01/29/2017   Procedure: IRRIGATION AND DEBRIDEMENT FOOT and application of wound vac;  Surgeon: Albertine Patricia, DPM;  Location: [facility] OR[facility];  Service: Podiatry;  Laterality: Right;  . LOWER EXTREMITY ANGIOGRAPHY Right 01/28/2017   Procedure: Lower Extremity Angiography;  Surgeon: Katha Cabal, MD;  Location: [place] [facility];  Service: Cardiovascular;  Laterality: Right;  . LOWER EXTREMITY INTERVENTION  01/28/2017   Procedure: Lower Extremity Intervention;  Surgeon: Katha Cabal, MD;   Location: [place] [facility];  Service: Cardiovascular;;  . REDUCTION MAMMAPLASTY      Prior to Admission medications   Medication Sig Start Date End Date Taking? Authorizing Provider  albuterol (PROVENTIL HFA;VENTOLIN HFA) 108 (90 BASE) MCG/ACT inhaler Inhale 2 puffs into the lungs every 6 (six) hours as needed for wheezing or shortness of breath.    [provider]  atenolol (TENORMIN) 50 MG tablet Take 50 mg by mouth daily. Reported on 05/25/2016    [provider]  calcium-vitamin D (OSCAL WITH D) 500-200 MG-UNIT tablet Take 2 tablets by mouth daily with breakfast. 11/23/16   Lloyd Huger, MD  clopidogrel (PLAVIX) 75 MG tablet Take 75 mg by mouth. 11/06/15   [provider]  furosemide (LASIX) 20 MG tablet Take 20 mg by mouth daily.    [provider]  gabapentin (NEURONTIN) 300 MG capsule Take 300 mg by mouth 3 (three) times daily.     [provider]  letrozole (FEMARA) 2.5 MG tablet Take 1 tablet (2.5 mg total) by mouth daily. 11/21/17   Lloyd Huger, MD  linagliptin (TRADJENTA) 5 MG TABS tablet Take 5 mg by mouth daily.    [provider]  lisinopril (PRINIVIL,ZESTRIL) 20 MG tablet Take 20 mg by mouth daily. 04/21/18   [provider]  metFORMIN (GLUCOPHAGE) 1000 MG tablet Take 1,000 mg by mouth 2 (two) times daily with a meal.    [provider]  pravastatin (PRAVACHOL) 40 MG tablet Take 40 mg by mouth daily. In afternoon    [provider]    Allergies Patient has no known allergies.  Family History  Problem Relation Age of Onset  . Diabetes Mother   . Cancer Mother   . Breast cancer Mother 59  . Diabetes Father   . Diabetes Sister   . Diabetes Brother     Social History Social History   Tobacco Use  . Smoking status: Never Smoker  . Smokeless tobacco: Never Used  Substance Use Topics  . Alcohol use: No    Alcohol/week: 0.0 oz  . Drug use: No    Review of  Systems Constitutional: No fever/chills Eyes: No visual changes. ENT: No sore throat. Cardiovascular: Denies chest pain. Respiratory: Denies shortness of breath. Gastrointestinal: No abdominal pain.  No nausea, no vomiting.  No diarrhea.  No constipation. Genitourinary: Negative for dysuria. Musculoskeletal: Negative for neck pain.  Negative for back pain. Integumentary: Negative for rash. Neurological: Negative for headaches, focal weakness or numbness.   ____________________________________________   PHYSICAL EXAM:  VITAL SIGNS: ED Triage Vitals  Enc Vitals Group     BP 05/09/18 2303 114/78     Pulse Rate 05/09/18 2303 80     Resp 05/09/18 2303 18     Temp 05/09/18 2303 100 F (37.8 C)     Temp Source 05/09/18 2303 Oral     SpO2 05/09/18 2303 97 %     Weight 05/09/18 2301 81.6 kg (180 lb)     Height 05/09/18 2301 1.6 m (5\' 3" )     Head Circumference --  Peak Flow --      Pain Score 05/09/18 2301 0     Pain Loc --      Pain Edu? --      Excl. in [place]? --     Constitutional: Alert and oriented. Well appearing and in no acute distress. Eyes: Conjunctivae are normal.  Head: Atraumatic. Mouth/Throat: Mucous membranes are dry  Oropharynx non-erythematous. Neck: No stridor.  No meningeal signs.  Cardiovascular: Normal rate, regular rhythm. Good peripheral circulation. Grossly normal heart sounds. Respiratory: Normal respiratory effort.  No retractions. Lungs CTAB. Gastrointestinal: Soft and nontender. No distention.  Musculoskeletal: No lower extremity tenderness nor edema. No gross deformities of extremities. Neurologic:  Normal speech and language. No gross focal neurologic deficits are appreciated.  Skin:  Skin is warm, dry and intact. No rash noted. Psychiatric: Mood and affect are normal. Speech and behavior are normal.  ____________________________________________   LABS (all labs ordered are listed, but only abnormal results are displayed)  Labs Reviewed   BASIC METABOLIC PANEL - Abnormal; Notable for the following components:      Result Value   Glucose, Bld 229 (*)    BUN 27 (*)    Creatinine, Ser 1.27 (*)    Calcium 8.6 (*)    GFR calc non Af Amer 43 (*)    GFR calc Af Amer 50 (*)    All other components within normal limits  CBC - Abnormal; Notable for the following components:   WBC 17.8 (*)    Hemoglobin 11.6 (*)    All other components within normal limits  TROPONIN I - Abnormal; Notable for the following components:   Troponin I 0.03 (*)    All other components within normal limits  LACTIC ACID, PLASMA - Abnormal; Notable for the following components:   Lactic Acid, Venous 3.1 (*)    All other components within normal limits  CULTURE, BLOOD (ROUTINE X 2)  CULTURE, BLOOD (ROUTINE X 2)  URINE CULTURE  URINALYSIS, COMPLETE (UACMP) WITH MICROSCOPIC  LACTIC ACID, PLASMA   ____________________________________________  EKG  ED ECG REPORT I, [place] N Doloris Servantes, the attending physician, personally viewed and interpreted this ECG.   Date: 05/10/2018  EKG Time: 11:06 PM  Rate: 78  Rhythm: Normal sinus rhythm  Axis: Normal  Intervals: Normal  ST&T Change: None  ____________________________________________  RADIOLOGY I, [place] N Jeramine Delis, personally viewed and evaluated these images (plain radiographs) as part of my medical decision making, as well as reviewing the written report by the radiologist.  ED MD interpretation: No acute findings noted on CT of the head per radiologist.  Official radiology report(s): Ct Head Wo Contrast  Result Date: 05/10/2018 CLINICAL DATA:  Weakness and off balance since yesterday. History of breast cancer and stroke. Altered level of consciousness. EXAM: CT HEAD WITHOUT CONTRAST TECHNIQUE: Contiguous axial images were obtained from the base of the skull through the vertex without intravenous contrast. COMPARISON:  10/09/2009 FINDINGS: Brain: Large focal area of encephalomalacia involving the  right frontal lobe with smaller focal areas of encephalomalacia in the right parietal and right occipital lobes. This would indicate old infarcts in the distribution of the right middle cerebral and posterior cerebral arteries. No significant progression since previous study. Mild cerebral atrophy. No significant ventricular dilatation. Low-attenuation changes in the deep white matter are consistent with small vessel ischemic change. No mass effect or midline shift. No abnormal extra-axial fluid collections. Basal cisterns are not effaced. No acute intracranial hemorrhage. Vascular: Intracranial arterial vascular calcifications are present.  Skull: The calvarium appears intact. No acute depressed fractures identified. Sinuses/Orbits: Paranasal sinuses and mastoid air cells are clear. Other: None. IMPRESSION: No acute intracranial abnormalities. Chronic atrophy and small vessel ischemic changes. Old infarcts in the right frontal, parietal and right occipital lobes. Electronically Signed   By: Lucienne Capers M.D.   On: 05/10/2018 03:42    ____________________ .Critical Care Performed by: Gregor Hams, MD Authorized by: Gregor Hams, MD   Critical care provider statement:    Critical care time (minutes):  30   Critical care time was exclusive of:  Separately billable procedures and treating other patients and teaching time   Critical care was necessary to treat or prevent imminent or life-threatening deterioration of the following conditions:  Sepsis   Critical care was time spent personally by me on the following activities:  Development of treatment plan with patient or surrogate, discussions with consultants, evaluation of patient's response to treatment, examination of patient, obtaining history from patient or surrogate, ordering and performing treatments and interventions, ordering and review of laboratory studies, ordering and review of radiographic studies, pulse oximetry, re-evaluation  of patient's condition and review of old charts   I assumed direction of critical care for this patient from another provider in my specialty: no       ____________________________________________   INITIAL IMPRESSION / ASSESSMENT AND PLAN / ED COURSE  As part of my medical decision making, I reviewed the following data within the electronic MEDICAL RECORD NUMBER   67 year old female presented with above-stated history and physical exam concerning for possible sepsis given fever leukocytosis tachypnea with concern for urinary source.  As such sepsis protocol was initiated.  Patient given appropriate antibiotic therapy.  Patient discussed with Dr. Marcille Blanco for hospital admission further. ____________________________________________  FINAL CLINICAL IMPRESSION(S) / ED DIAGNOSES  Final diagnoses:  Sepsis, due to unspecified organism [facility])  Urinary tract infection   MEDICATIONS GIVEN DURING THIS VISIT:  Medications  sodium chloride 0.9 % bolus 1,000 mL (1,000 mLs Intravenous New Bag/Given 05/10/18 0422)  piperacillin-tazobactam (ZOSYN) IVPB 3.375 g (has no administration in time range)  vancomycin (VANCOCIN) 1,250 mg in sodium chloride 0.9 % 250 mL IVPB (has no administration in time range)  sodium chloride 0.9 % bolus 1,000 mL (0 mLs Intravenous Stopped 05/10/18 0421)  piperacillin-tazobactam (ZOSYN) IVPB 3.375 g (0 g Intravenous Stopped 05/10/18 0316)  vancomycin (VANCOCIN) IVPB 1000 mg/200 mL premix (0 mg Intravenous Stopped 05/10/18 0421)     ED Discharge Orders    None       Note:  This document was prepared using Dragon voice recognition software and may include unintentional dictation errors.    Gregor Hams, MD 05/10/18 [phone]

## 2018-05-10 NOTE — Progress Notes (Signed)
The patient has generalized weakness.  Vital signs are stable. Physical examinations is unremarkable except mild erythema above the right ankle. A/P: This is a 67 year old female admitted for sepsis. 1.  Sepsis due to UTI. Continue IV antibiotics Follow CBC, urine culture, blood cultures for growth and sensitivities.  2.  Hypertension: Controlled; continue atenolol and lisinopril 3.  Diabetes mellitus type 2: Sliding scale insulin while hospitalized.  May continue linagliptin 4.  History of stroke: Continue Plavix 5.  Upper lipidemia: Continue statin therapy 6.  History of breast cancer: Continue letrozole  Discussed with the patient, RN.  Time spent 25 minutes.

## 2018-05-10 NOTE — Plan of Care (Signed)
  Problem: Pain Managment: Goal: General experience of comfort will improve Outcome: Progressing Note:  PRN medications   Problem: Safety: Goal: Ability to remain free from injury will improve Outcome: Progressing Note:  Fall precautions in place, non skid socks when oob, bed alarm on    Problem: Fluid Volume: Goal: Hemodynamic stability will improve Outcome: Progressing Note:  No fever, BP stable   Problem: Clinical Measurements: Goal: Will remain free from infection Outcome: Not Progressing Note:  Remains on IV antibiotics Goal: Diagnostic test results will improve Outcome: Not Progressing Note:  WBC 17.8 Last Latic acid 2.4 BUN 27/1.27

## 2018-05-10 NOTE — ED Notes (Signed)
Dr Owens Shark made aware at this time of pt's elevated Lactic acid level as reported by lab: Lactic Acid 3.79mmol/L

## 2018-05-10 NOTE — Progress Notes (Signed)
Pharmacy Antibiotic Note  Elizabeth [name] is a 67 y.o. female admitted on 05/10/2018 with sepsis.  Pharmacy has been consulted for vanc/zosyn dosing. Patient received vanc 1g and zosyn 3.375g IV x 1 in ED  Plan: Will continue vanc 1.25g IV q24h w/ 6 hour stack  Will draw vanc trough 07/06 @ 0800 prior to 4th dose. Will continue zosyn 3.375g IV q8h  Ke 0.041 T1/2 17 ~ 24 hrs Goal trough 15 - 20 mcg/mL  Height: 5\' 3"  (160 cm) Weight: 180 lb (81.6 kg) IBW/kg (Calculated) : 52.4  Temp (24hrs), Avg:100 F (37.8 C), Min:100 F (37.8 C), Max:100 F (37.8 C)  Recent Labs  Lab 05/09/18 2304 05/10/18 0230  WBC 17.8*  --   CREATININE 1.27*  --   LATICACIDVEN  --  3.1*    Estimated Creatinine Clearance: 44.1 mL/min (A) (by C-G formula based on SCr of 1.27 mg/dL (H)).    No Known Allergies  Thank you for allowing pharmacy to be a part of this patient's care.  Tobie Lords, PharmD, BCPS Clinical Pharmacist 05/10/2018

## 2018-05-10 NOTE — H&P (Signed)
Elizabeth [name] is an 67 y.o. female.   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  The patient states that she is felt unwell since yesterday.  She denies fever, nausea, vomiting or diarrhea.  The patient was found to be tachypneic and laboratory evaluation revealed leukocytosis as well as lactic acidosis.  Sepsis protocol was initiated and antibiotics started prior to the emergency department staff calling the hospitalist service for admission.  Past Medical History:  Diagnosis Date  . Cancer [facility])    breast  . Carcinoma of right breast ([place]) 05/20/2014  . Diabetes mellitus without complication ([place])   . Hypertension   . Lymphedema   . Personal history of radiation therapy   . PONV (postoperative nausea and vomiting)   . Psoriasis 1990  . Stroke ([place]) 2000   residual left arm weakness  . Varicose veins     Past Surgical History:  Procedure Laterality Date  . AMPUTATION TOE Right 01/25/2017   Procedure: AMPUTATION TOE   ;  Surgeon: Albertine Patricia, DPM;  Location: [facility] ORS;  Service: Podiatry;  Laterality: Right;  . BREAST EXCISIONAL BIOPSY Right 2015   positive, with radiation  . BREAST LUMPECTOMY Right 04-2014   followed by radiation,  no chemo  . BREAST REDUCTION SURGERY  1977  . CHOLECYSTECTOMY N/A 09/23/2015   Procedure: LAPAROSCOPIC CHOLECYSTECTOMY;  Surgeon: Bonner Puna, MD;  Location: [facility] ORS;  Service: General;  Laterality: N/A;  . DILATION AND CURETTAGE OF UTERUS  2010  . EYE SURGERY Left 2015   cataracts and left eye detached retina repair  . EYE SURGERY Right 2013   cataract with len implant  . GASTRIC ROUX-EN-Y N/A 09/23/2015   Procedure: LAPAROSCOPIC ROUX-EN-Y GASTRIC;  Surgeon: Bonner Puna, MD;  Location: [facility] ORS;  Service: General;  Laterality: N/A;  . HIATAL HERNIA REPAIR N/A  09/23/2015   Procedure: LAPAROSCOPIC REPAIR OF HIATAL HERNIA;  Surgeon: Bonner Puna, MD;  Location: [facility] ORS;  Service: General;  Laterality: N/A;  . IRRIGATION AND DEBRIDEMENT FOOT Right 01/25/2017   Procedure: IRRIGATION AND DEBRIDEMENT FOOT;  Surgeon: Albertine Patricia, DPM;  Location: [facility] ORS;  Service: Podiatry;  Laterality: Right;  . IRRIGATION AND DEBRIDEMENT FOOT Right 01/29/2017   Procedure: IRRIGATION AND DEBRIDEMENT FOOT and application of wound vac;  Surgeon: Albertine Patricia, DPM;  Location: [facility] ORS;  Service: Podiatry;  Laterality: Right;  . LOWER EXTREMITY ANGIOGRAPHY Right 01/28/2017   Procedure: Lower Extremity Angiography;  Surgeon: Katha Cabal, MD;  Location: [facility];  Service: Cardiovascular;  Laterality: Right;  . LOWER EXTREMITY INTERVENTION  01/28/2017   Procedure: Lower Extremity Intervention;  Surgeon: Katha Cabal, MD;  Location: [facility];  Service: Cardiovascular;;  . REDUCTION MAMMAPLASTY      Family History  Problem Relation Age of Onset  . Diabetes Mother   . Cancer Mother   . Breast cancer Mother 66  . Diabetes Father   . Diabetes Sister   . Diabetes Brother    Social History:  reports that she has never smoked. She has never used smokeless tobacco. She reports that she does not drink alcohol or use drugs.  Allergies: No Known Allergies   (Not in a hospital admission)  Results for orders placed or performed during the hospital encounter of 05/10/18 (from the past 48 hour(s))  Basic  metabolic panel     Status: Abnormal   Collection Time: 05/09/18 11:04 PM  Result Value Ref Range   Sodium 136 135 - 145 mmol/L   Potassium 4.1 3.5 - 5.1 mmol/L    Comment: HEMOLYSIS AT THIS LEVEL MAY AFFECT RESULT   Chloride 103 98 - 111 mmol/L    Comment: Please note change in reference range.   CO2 22 22 - 32 mmol/L   Glucose, Bld 229 (H) 70 - 99 mg/dL    Comment: Please note change in reference range.   BUN 27 (H) 8 - 23 mg/dL     Comment: Please note change in reference range.   Creatinine, Ser 1.27 (H) 0.44 - 1.00 mg/dL   Calcium 8.6 (L) 8.9 - 10.3 mg/dL   GFR calc non Af Amer 43 (L) >60 mL/min   GFR calc Af Amer 50 (L) >60 mL/min    Comment: (NOTE) The eGFR has been calculated using the CKD EPI equation. This calculation has not been validated in all clinical situations. eGFR's persistently <60 mL/min signify possible Chronic Kidney Disease.    Anion gap 11 5 - 15    Comment: Performed at [facility], [address] [place], [place] [address]  CBC     Status: Abnormal   Collection Time: 05/09/18 11:04 PM  Result Value Ref Range   WBC 17.8 (H) 3.6 - 11.0 K/uL   RBC 4.08 3.80 - 5.20 MIL/uL   Hemoglobin 11.6 (L) 12.0 - 16.0 g/dL   HCT 35.0 35.0 - 47.0 %   MCV 85.6 80.0 - 100.0 fL   MCH 28.5 26.0 - 34.0 pg   MCHC 33.3 32.0 - 36.0 g/dL   RDW 13.0 11.5 - 14.5 %   Platelets 192 150 - 440 K/uL    Comment: Performed at [facility], [address] [place], [place] [address]  Troponin I     Status: Abnormal   Collection Time: 05/09/18 11:04 PM  Result Value Ref Range   Troponin I 0.03 (HH) <0.03 ng/mL    Comment: CRITICAL RESULT CALLED TO, READ BACK BY AND VERIFIED WITH CHERIE ALLISON _0  05/09/18 [facility] Performed at [facility], [address] [place], [place] [address]   Lactic acid, plasma     Status: Abnormal   Collection Time: 05/10/18  2:30 AM  Result Value Ref Range   Lactic Acid, Venous 3.1 (HH) 0.5 - 1.9 mmol/L    Comment: CRITICAL RESULT CALLED TO, READ BACK BY AND VERIFIED WITH BUTCH WOODS _1  05/10/18 [facility] Performed at [facility], [address] [place],  [address]    Ct Head Wo Contrast  Result Date: 05/10/2018 CLINICAL DATA:  Weakness and off balance since yesterday. History of breast cancer and stroke. Altered level of consciousness. EXAM: CT HEAD WITHOUT CONTRAST TECHNIQUE: Contiguous axial images were obtained from the base of the skull  through the vertex without intravenous contrast. COMPARISON:  10/09/2009 FINDINGS: Brain: Large focal area of encephalomalacia involving the right frontal lobe with smaller focal areas of encephalomalacia in the right parietal and right occipital lobes. This would indicate old infarcts in the distribution of the right middle cerebral and posterior cerebral arteries. No significant progression since previous study. Mild cerebral atrophy. No significant ventricular dilatation. Low-attenuation changes in the deep white matter are consistent with small vessel ischemic change. No mass effect or midline shift. No abnormal extra-axial fluid collections. Basal cisterns are not effaced. No acute intracranial hemorrhage. Vascular: Intracranial arterial vascular calcifications  are present. Skull: The calvarium appears intact. No acute depressed fractures identified. Sinuses/Orbits: Paranasal sinuses and mastoid air cells are clear. Other: None. IMPRESSION: No acute intracranial abnormalities. Chronic atrophy and small vessel ischemic changes. Old infarcts in the right frontal, parietal and right occipital lobes. Electronically Signed   By: Lucienne Capers M.D.   On: 05/10/2018 03:42    Review of Systems  Constitutional: Negative for chills and fever.  HENT: Negative for sore throat and tinnitus.   Eyes: Negative for blurred vision and redness.  Respiratory: Negative for cough and shortness of breath.   Cardiovascular: Negative for chest pain, palpitations, orthopnea and PND.  Gastrointestinal: Negative for abdominal pain, diarrhea, nausea and vomiting.  Genitourinary: Negative for dysuria, frequency and urgency.  Musculoskeletal: Negative for joint pain and myalgias.  Skin: Negative for rash.       No lesions  Neurological: Positive for weakness. Negative for speech change and focal weakness.  Endo/Heme/Allergies: Does not bruise/bleed easily.       No temperature intolerance  Psychiatric/Behavioral: Negative  for depression and suicidal ideas.    Blood pressure (!) 141/62, pulse 86, temperature 100 F (37.8 C), temperature source Oral, resp. rate (!) 23, height _0  (1.6 m), weight 81.6 kg (180 lb), SpO2 97 %. Physical Exam  Vitals reviewed. Constitutional: She is oriented to person, place, and time. She appears well-developed and well-nourished. No distress.  HENT:  Head: Normocephalic and atraumatic.  Mouth/Throat: Oropharynx is clear and moist.  Eyes: Pupils are equal, round, and reactive to light. Conjunctivae and EOM are normal. No scleral icterus.  Neck: Normal range of motion. Neck supple. No JVD present. No tracheal deviation present. No thyromegaly present.  Cardiovascular: Normal rate, regular rhythm and normal heart sounds. Exam reveals no gallop and no friction rub.  No murmur heard. Respiratory: Effort normal and breath sounds normal.  GI: Soft. Bowel sounds are normal. She exhibits no distension. There is no tenderness.  Genitourinary:  Genitourinary Comments: Deferred  Musculoskeletal: Normal range of motion. She exhibits no edema.  Lymphadenopathy:    She has no cervical adenopathy.  Neurological: She is alert and oriented to person, place, and time. No cranial nerve deficit. She exhibits normal muscle tone.  Skin: Skin is warm and dry. No rash noted. No erythema.  Psychiatric: She has a normal mood and affect. Her behavior is normal. Judgment and thought content normal.     Assessment/Plan This is a 67 year old female admitted for sepsis. 1.  Sepsis: The patient meets criteria via tachypnea, leukocytosis and elevated lactic acid.  She is hemodynamically stable.  Follow blood cultures for growth and sensitivities.  Source appears to be urine at this time.  Follow urine culture as well.  Continue broad-spectrum antibiotics. 2.  Hypertension: Controlled; continue atenolol and lisinopril 3.  Diabetes mellitus type 2: Sliding scale insulin while hospitalized.  May continue  linagliptin 4.  History of stroke: Continue Plavix 5.  Upper lipidemia: Continue statin therapy 6.  History of breast cancer: Continue letrozole 7.  DVT prophylaxis: Lovenox 8.  GI prophylaxis: None The patient is a full code.  Time spent on admission orders and patient care approximately 45 minutes  Harrie Foreman, MD 05/10/2018, 5:16 AM

## 2018-05-11 DIAGNOSIS — A419 Sepsis, unspecified organism: Secondary | ICD-10-CM | POA: Diagnosis not present

## 2018-05-11 DIAGNOSIS — R531 Weakness: Secondary | ICD-10-CM | POA: Diagnosis not present

## 2018-05-11 LAB — CBC
HCT: 32.8 % — ABNORMAL LOW (ref 35.0–47.0)
Hemoglobin: 10.9 g/dL — ABNORMAL LOW (ref 12.0–16.0)
MCH: 28.5 pg (ref 26.0–34.0)
MCHC: 33.1 g/dL (ref 32.0–36.0)
MCV: 86.1 fL (ref 80.0–100.0)
Platelets: 145 10*3/uL — ABNORMAL LOW (ref 150–440)
RBC: 3.81 MIL/uL (ref 3.80–5.20)
RDW: 13.3 % (ref 11.5–14.5)
WBC: 10.8 10*3/uL (ref 3.6–11.0)

## 2018-05-11 LAB — GLUCOSE, CAPILLARY
Glucose-Capillary: 116 mg/dL — ABNORMAL HIGH (ref 70–99)
Glucose-Capillary: 132 mg/dL — ABNORMAL HIGH (ref 70–99)

## 2018-05-11 LAB — URINE CULTURE

## 2018-05-11 MED ORDER — NYSTATIN 100000 UNIT/GM EX POWD: Freq: Four times a day (QID) | CUTANEOUS | 0 refills | Status: DC

## 2018-05-11 MED ORDER — SODIUM CHLORIDE 0.9 % IV SOLN
1.0000 g | INTRAVENOUS | Status: DC
  Administered 2018-05-11: 1 g via INTRAVENOUS
  Filled 2018-05-11 (×2): qty 10

## 2018-05-11 MED ORDER — CEPHALEXIN 500 MG PO CAPS: 500.0000 mg | ORAL_CAPSULE | Freq: Four times a day (QID) | ORAL | 0 refills | Status: AC

## 2018-05-11 NOTE — Evaluation (Signed)
Physical Therapy Evaluation Patient Details Name: Elizabeth [name] MRN: [id number] DOB: 08/31/1951 Today's Date: 05/11/2018   History of Present Illness  Patient is a 67 year old female who was admitted to the hospital for sepsis, pressure injury, and cellulitus of RLE, presented to ED for weakness. Patient has PMH: breast cancer stroke w residual weak LUE, HTN, and DM.   Clinical Impression  Patient is a pleasant 67 year old female who presents with weakness secondary to admittance for sepsis, pressure wound, and cellulitis of RLE. Patient's mental status is difficult to ascertain at this time due to need for cues for task orientation, delayed response, and no family present. Patient demonstrates ability to sit EOB with assistance from handrails however requires assistance to return legs onto bed. STS transfer requires Min A with verbal cues for sequencing and hand placement. Patient session limited by repetitive bowel movements. Patient would benefit from skilled physical therapy to improve strength and mobility to allow patient to participate in ADLs and return to ambulation. Patient will require 24 hour supervision due to limited ability to transfer independently.      Follow Up Recommendations Home health PT;Supervision/Assistance - 24 hour    Equipment Recommendations  3in1 (PT)    Recommendations for Other Services       Precautions / Restrictions Precautions Precautions: Fall Precaution Comments: unstable in standing position  Restrictions Weight Bearing Restrictions: No Other Position/Activity Restrictions: RLE elevate due to cellulitis       Mobility  Bed Mobility Overal bed mobility: Needs Assistance Bed Mobility: Supine to Sit;Sit to Supine     Supine to sit: Supervision;Modified independent (Device/Increase time);HOB elevated Sit to supine: Min assist;HOB elevated   General bed mobility comments: Patient requires additional time, use of UE's and handrails, and  sequencing cues for performance. Requires assistance with LE's to return to supine position.   Transfers Overall transfer level: Needs assistance Equipment used: Standard walker Transfers: Sit to/from Stand Sit to Stand: Min assist         General transfer comment: Patient requires Min A to transfer from EOB to standing with cues for hand placement and sequencing.   Ambulation/Gait             General Gait Details: pt. declined due to bowel movements.   Stairs            Wheelchair Mobility    Modified Rankin (Stroke Patients Only)       Balance Overall balance assessment: Needs assistance Sitting-balance support: Bilateral upper extremity supported;Feet supported Sitting balance-Leahy Scale: Fair Sitting balance - Comments: able to maintain seated position with UE and LE supported  Postural control: Posterior lean Standing balance support: Bilateral upper extremity supported Standing balance-Leahy Scale: Poor Standing balance comment: Pt. requires Min A to retain standing position for 2 minutes.                              Pertinent Vitals/Pain Pain Assessment: No/denies pain    Home Living Family/patient expects to be discharged to:: Private residence Living Arrangements: Spouse/significant other;Other relatives Available Help at Discharge: Family Type of Home: House Home Access: Stairs to enter Entrance Stairs-Rails: Right;Left;Can reach both Entrance Stairs-Number of Steps: 3 Home Layout: One level Home Equipment: Walker - 4 wheels;Cane - single point;Shower seat;Grab bars - tub/shower Additional Comments: Patient reports she lives at home with husband. Does not have any assitance besides husband at this time.       Prior Function Level of Independence: Independent with assistive device(s)         Comments: Patient reports she ambulated with her cane primarily and was independent with dressing and most ADLs. Husband cooked for her per  her report.      Hand Dominance        Extremity/Trunk Assessment   Upper Extremity Assessment Upper Extremity Assessment: Generalized weakness    Lower Extremity Assessment Lower Extremity Assessment: Generalized weakness;RLE deficits/detail;LLE deficits/detail RLE Deficits / Details: gross 3+/5 strength however cellulitis limits mobility  RLE Sensation: decreased light touch RLE Coordination: decreased gross motor LLE Deficits / Details: 3+/5 gross, with gluteal weakness/hip extensors 3/5.  LLE Sensation: WNL LLE Coordination: decreased gross motor(due to fatigue )       Communication   Communication: Other (comment)(delay in response, requires task orientation )  Cognition Arousal/Alertness: Awake/alert Behavior During Therapy: WFL for tasks assessed/performed Overall Cognitive Status: No family/caregiver present to determine baseline cognitive functioning                                 General Comments: Patient requires multiple cues for task orientation, easy to distract, and requires additional response time.       General Comments General comments (skin integrity, edema, etc.): cellulitis of RLE    Exercises General Exercises - Lower Extremity Ankle Circles/Pumps: Strengthening;10 reps;Supine Heel Slides: AROM;10 reps;Left Hip ABduction/ADduction: Strengthening;Both;5 reps Other Exercises Other Exercises: standing tolerance with RW and Min A, cues for gluteal squeeze for upright posture : terminated due to bowel movement  Other Exercises: EOB sitting balance x 5 minutes    Assessment/Plan    PT Assessment All further PT needs can be met in the next venue of care(d/c today)  PT Problem List Decreased strength;Decreased activity tolerance;Decreased balance;Decreased mobility;Decreased coordination;Decreased safety awareness       PT Treatment Interventions      PT Goals (Current goals can be found in the Care Plan section)  Acute Rehab PT  Goals Patient Stated Goal: return home PT Goal Formulation: With patient Time For Goal Achievement: 05/25/18 Potential to Achieve Goals: Fair    Frequency     Barriers to discharge        Co-evaluation               AM-PAC PT "6 Clicks" Daily Activity  Outcome Measure Difficulty turning over in bed (including adjusting bedclothes, sheets and blankets)?: A Lot Difficulty moving from lying on back to sitting on the side of the bed? : A Lot Difficulty sitting down on and standing up from a chair with arms (e.g., wheelchair, bedside commode, etc,.)?: Unable Help needed moving to and from a bed to chair (including a wheelchair)?: A Little Help needed walking in hospital room?: A Lot Help needed climbing 3-5 steps with a railing? : Total 6 Click Score: 11    End of Session Equipment Utilized During Treatment: Gait belt Activity Tolerance: Patient tolerated treatment well Patient left: in bed;with nursing/sitter in room Nurse Communication: Mobility status;Other (comment) PT Visit Diagnosis: Unsteadiness on feet (R26.81);Other abnormalities of gait and mobility (R26.89);Muscle weakness (generalized) (M62.81)    Time: 3976-7341 PT Time Calculation (min) (ACUTE ONLY): 35 min   Charges:   PT Evaluation $PT Eval Low Complexity: 1 Low PT Treatments $Therapeutic Exercise: 8-22 mins   PT G Codes:        Janna Arch, PT, DPT    Janna Arch  05/11/2018, 1:13 PM

## 2018-05-11 NOTE — Plan of Care (Signed)
  Problem: Skin Integrity: Goal: Risk for impaired skin integrity will decrease Outcome: Progressing   Problem: Clinical Measurements: Goal: Signs and symptoms of infection will decrease Outcome: Progressing  More alert at this time

## 2018-05-11 NOTE — Care Management Note (Signed)
Case Management Note  Patient Details  Name: Elizabeth [name] MRN: [id number] Date of Birth: Apr 08, 1951  Subjective/Objective:  Spoke with patient who lives at home with her spouse. Her spouse is her primary caregiver and provides 24 hour care for her.Offered home health services. Patient is agreeable. She has no agency preference. [facility]erral to [facility] for [facility] and PT. No DME needs. Patient has a walker, cane and shower seat.        Action/Plan: [facility] for HHPT and RN.  Expected Discharge Date:  05/11/18               Expected Discharge Plan:  [place]  In-House Referral:     Discharge planning Services  CM Consult  Post Acute Care Choice:  Home Health Choice offered to:  Patient  DME Arranged:    DME Agency:     HH Arranged:  RN, PT [place] Agency:  [facility]  Status of Service:  Completed, signed off  If discussed at [place] of Stay Meetings, dates discussed:    Additional Comments:  Jolly Mango, RN 05/11/2018, 3:11 PM

## 2018-05-11 NOTE — Discharge Summary (Signed)
[facility] NAME: Elizabeth [name]    MR#:  [id number]  DATE OF BIRTH:  November 03, 1951  DATE OF ADMISSION:  05/10/2018 ADMITTING PHYSICIAN: Harrie Foreman, MD  DATE OF DISCHARGE: 05/11/2018  PRIMARY CARE PHYSICIAN: Marguerita Merles, MD    ADMISSION DIAGNOSIS:  Sepsis, due to unspecified organism ([place]) [A41.9]  DISCHARGE DIAGNOSIS:  Active Problems:   Sepsis ([place])   Pressure injury of skin   SECONDARY DIAGNOSIS:   Past Medical History:  Diagnosis Date  . Cancer [facility])    breast  . Carcinoma of right breast ([place]) 05/20/2014  . Diabetes mellitus without complication ([place])   . Hypertension   . Lymphedema   . Personal history of radiation therapy   . PONV (postoperative nausea and vomiting)   . Psoriasis 1990  . Stroke ([place]) 2000   residual left arm weakness  . Varicose veins     HOSPITAL COURSE:   67 year old female with history of diabetes and brest cancer who presents to the ED with generalized weakness.  1.  Sepsis: Patient presented with leukocytosis and tachypnea.  Sepsis is due to urinary tract infection as well as right lower extremity cellulitis.  White blood cell count has improved.  Lactic acid has normalized.  2.  UTI: Patient will be discharged on oral Keflex.  We asked that PCP please follow-up on urine culture.  3.  Right lower extremity cellulitis: Patient will continue to elevate the right lower extremity.  She will be discharged on oral Keflex.  She will also be discharged on nystatin powder to use between her toes which is likely etiology of cellulitis.  4.  Diabetes: Patient will continue with ADA diet and outpatient regimen of Tradjenta and metformin  5.  History of breast cancer: She will continue Femara  6.  Essential hypertension: Continue lisinopril   DISCHARGE CONDITIONS AND DIET:  Stable for discharge on heart healthy diabetic diet  CONSULTS OBTAINED:    DRUG ALLERGIES:  No Known  Allergies  DISCHARGE MEDICATIONS:   Allergies as of 05/11/2018   No Known Allergies     Medication List    TAKE these medications   albuterol 108 (90 Base) MCG/ACT inhaler Commonly known as:  PROVENTIL HFA;VENTOLIN HFA Inhale 2 puffs into the lungs every 6 (six) hours as needed for wheezing or shortness of breath.   calcium-vitamin D 500-200 MG-UNIT tablet Commonly known as:  OSCAL WITH D Take 2 tablets by mouth daily with breakfast.   cephALEXin 500 MG capsule Commonly known as:  KEFLEX Take 1 capsule (500 mg total) by mouth 4 (four) times daily for 10 days.   clopidogrel 75 MG tablet Commonly known as:  PLAVIX Take 75 mg by mouth.   furosemide 20 MG tablet Commonly known as:  LASIX Take 20 mg by mouth daily.   gabapentin 300 MG capsule Commonly known as:  NEURONTIN Take 300 mg by mouth 3 (three) times daily.   letrozole 2.5 MG tablet Commonly known as:  FEMARA Take 1 tablet (2.5 mg total) by mouth daily.   linagliptin 5 MG Tabs tablet Commonly known as:  TRADJENTA Take 5 mg by mouth daily.   lisinopril 20 MG tablet Commonly known as:  PRINIVIL,ZESTRIL Take 20 mg by mouth daily.   metFORMIN 1000 MG tablet Commonly known as:  GLUCOPHAGE Take 1,000 mg by mouth 2 (two) times daily with a meal.   nystatin powder Commonly known as:  MYCOSTATIN/NYSTOP Apply topically  4 (four) times daily.   PARoxetine 40 MG tablet Commonly known as:  PAXIL Take 40 mg by mouth daily.   pravastatin 40 MG tablet Commonly known as:  PRAVACHOL Take 40 mg by mouth daily. In afternoon         Today   CHIEF COMPLAINT:  No acute issues overnight.   VITAL SIGNS:  Blood pressure 130/67, pulse 71, temperature 99 F (37.2 C), temperature source Oral, resp. rate 18, height 5\' 3"  (1.6 m), weight 91.9 kg (202 lb 9.6 oz), SpO2 91 %.   REVIEW OF SYSTEMS:  Review of Systems  Constitutional: Negative.  Negative for chills, fever and malaise/fatigue.  HENT: Negative.  Negative  for ear discharge, ear pain, hearing loss, nosebleeds and sore throat.   Eyes: Negative.  Negative for blurred vision and pain.  Respiratory: Negative.  Negative for cough, hemoptysis, shortness of breath and wheezing.   Cardiovascular: Negative.  Negative for chest pain, palpitations and leg swelling.  Gastrointestinal: Negative.  Negative for abdominal pain, blood in stool, diarrhea, nausea and vomiting.  Genitourinary: Negative.  Negative for dysuria.  Musculoskeletal: Negative.  Negative for back pain.  Skin: Negative.        Cellulitis RLE  Neurological: Negative for dizziness, tremors, speech change, focal weakness, seizures and headaches.  Endo/Heme/Allergies: Negative.  Does not bruise/bleed easily.  Psychiatric/Behavioral: Negative.  Negative for depression, hallucinations and suicidal ideas.     PHYSICAL EXAMINATION:  GENERAL:  67 y.o.-year-old patient lying in the bed with no acute distress.  NECK:  Supple, no jugular venous distention. No thyroid enlargement, no tenderness.  LUNGS: Normal breath sounds bilaterally, no wheezing, rales,rhonchi  No use of accessory muscles of respiration.  CARDIOVASCULAR: S1, S2 normal. No murmurs, rubs, or gallops.  ABDOMEN: Soft, non-tender, non-distended. Bowel sounds present. No organomegaly or mass.  EXTREMITIES: No pedal edema, cyanosis, or clubbing.  PSYCHIATRIC: The patient is alert and oriented x 3.  SKIN: Lower extremity cellulitis with no pain or tenderness  DATA REVIEW:   CBC Recent Labs  Lab 05/11/18 0935  WBC 10.8  HGB 10.9*  HCT 32.8*  PLT 145*    Chemistries  Recent Labs  Lab 05/09/18 2304  NA 136  K 4.1  CL 103  CO2 22  GLUCOSE 229*  BUN 27*  CREATININE 1.27*  CALCIUM 8.6*    Cardiac Enzymes Recent Labs  Lab 05/09/18 2304  TROPONINI 0.03*    Microbiology Results  @[id number] @  RADIOLOGY:  Ct Head Wo Contrast  Result Date: 05/10/2018 CLINICAL DATA:  Weakness and off balance since yesterday.  History of breast cancer and stroke. Altered level of consciousness. EXAM: CT HEAD WITHOUT CONTRAST TECHNIQUE: Contiguous axial images were obtained from the base of the skull through the vertex without intravenous contrast. COMPARISON:  10/09/2009 FINDINGS: Brain: Large focal area of encephalomalacia involving the right frontal lobe with smaller focal areas of encephalomalacia in the right parietal and right occipital lobes. This would indicate old infarcts in the distribution of the right middle cerebral and posterior cerebral arteries. No significant progression since previous study. Mild cerebral atrophy. No significant ventricular dilatation. Low-attenuation changes in the deep white matter are consistent with small vessel ischemic change. No mass effect or midline shift. No abnormal extra-axial fluid collections. Basal cisterns are not effaced. No acute intracranial hemorrhage. Vascular: Intracranial arterial vascular calcifications are present. Skull: The calvarium appears intact. No acute depressed fractures identified. Sinuses/Orbits: Paranasal sinuses and mastoid air cells are clear. Other: None. IMPRESSION: No acute intracranial  abnormalities. Chronic atrophy and small vessel ischemic changes. Old infarcts in the right frontal, parietal and right occipital lobes. Electronically Signed   By: Lucienne Capers M.D.   On: 05/10/2018 03:42      Allergies as of 05/11/2018   No Known Allergies     Medication List    TAKE these medications   albuterol 108 (90 Base) MCG/ACT inhaler Commonly known as:  PROVENTIL HFA;VENTOLIN HFA Inhale 2 puffs into the lungs every 6 (six) hours as needed for wheezing or shortness of breath.   calcium-vitamin D 500-200 MG-UNIT tablet Commonly known as:  OSCAL WITH D Take 2 tablets by mouth daily with breakfast.   cephALEXin 500 MG capsule Commonly known as:  KEFLEX Take 1 capsule (500 mg total) by mouth 4 (four) times daily for 10 days.   clopidogrel 75 MG  tablet Commonly known as:  PLAVIX Take 75 mg by mouth.   furosemide 20 MG tablet Commonly known as:  LASIX Take 20 mg by mouth daily.   gabapentin 300 MG capsule Commonly known as:  NEURONTIN Take 300 mg by mouth 3 (three) times daily.   letrozole 2.5 MG tablet Commonly known as:  FEMARA Take 1 tablet (2.5 mg total) by mouth daily.   linagliptin 5 MG Tabs tablet Commonly known as:  TRADJENTA Take 5 mg by mouth daily.   lisinopril 20 MG tablet Commonly known as:  PRINIVIL,ZESTRIL Take 20 mg by mouth daily.   metFORMIN 1000 MG tablet Commonly known as:  GLUCOPHAGE Take 1,000 mg by mouth 2 (two) times daily with a meal.   nystatin powder Commonly known as:  MYCOSTATIN/NYSTOP Apply topically 4 (four) times daily.   PARoxetine 40 MG tablet Commonly known as:  PAXIL Take 40 mg by mouth daily.   pravastatin 40 MG tablet Commonly known as:  PRAVACHOL Take 40 mg by mouth daily. In afternoon         Management plans discussed with the patient and she is in agreement. Stable for discharge   Patient should follow up with pcp  CODE STATUS:     Code Status Orders  (From admission, onward)        Start     Ordered   05/10/18 0615  Full code  Continuous     05/10/18 0614    Code Status History    Date Active Date Inactive Code Status Order ID Comments User Context   01/22/2017 1650 02/01/2017 1753 Full Code [id number]  Idelle Crouch, MD Inpatient   09/23/2015 1716 09/25/2015 2048 Full Code [id number]  Bonner Puna, MD Inpatient      TOTAL TIME TAKING CARE OF THIS PATIENT: 38 minutes.    Note: This dictation was prepared with Dragon dictation along with smaller phrase technology. Any transcriptional errors that result from this process are unintentional.  Shonteria Abeln M.D on 05/11/2018 at 10:04 AM  Between 7am to 6pm - Pager - [phone] After 6pm go to [email or web address] - password [email or web address] [facility]  Office  [phone]  CC: Primary  care physician; Marguerita Merles, MD

## 2018-05-12 LAB — MRSA CULTURE: Culture: NOT DETECTED

## 2018-05-15 LAB — CULTURE, BLOOD (ROUTINE X 2)
CULTURE: NO GROWTH
Culture: NO GROWTH
SPECIAL REQUESTS: ADEQUATE
Special Requests: ADEQUATE

## 2018-05-23 ENCOUNTER — Ambulatory Visit: Payer: Medicare HMO | Admitting: Oncology

## 2018-05-29 ENCOUNTER — Ambulatory Visit
Admission: RE | Admit: 2018-05-29 | Discharge: 2018-05-29 | Disposition: A | Discharge status: Home / Self Care | Payer: Medicare HMO | Source: Ambulatory Visit | Attending: Oncology | Admitting: Oncology

## 2018-05-29 DIAGNOSIS — C50511 Malignant neoplasm of lower-outer quadrant of right female breast: Secondary | ICD-10-CM

## 2018-05-29 IMAGING — MG MM DIGITAL DIAGNOSTIC BILAT W/ TOMO W/ CAD
6 of 9 series · 6 of 25 positions shown · non-contrast
Comparison: Previous exam(s).

CLINICAL DATA: Right lumpectomy.  Annual mammography.

EXAM:
DIGITAL DIAGNOSTIC BILATERAL MAMMOGRAM WITH CAD AND TOMO

[R MLO]
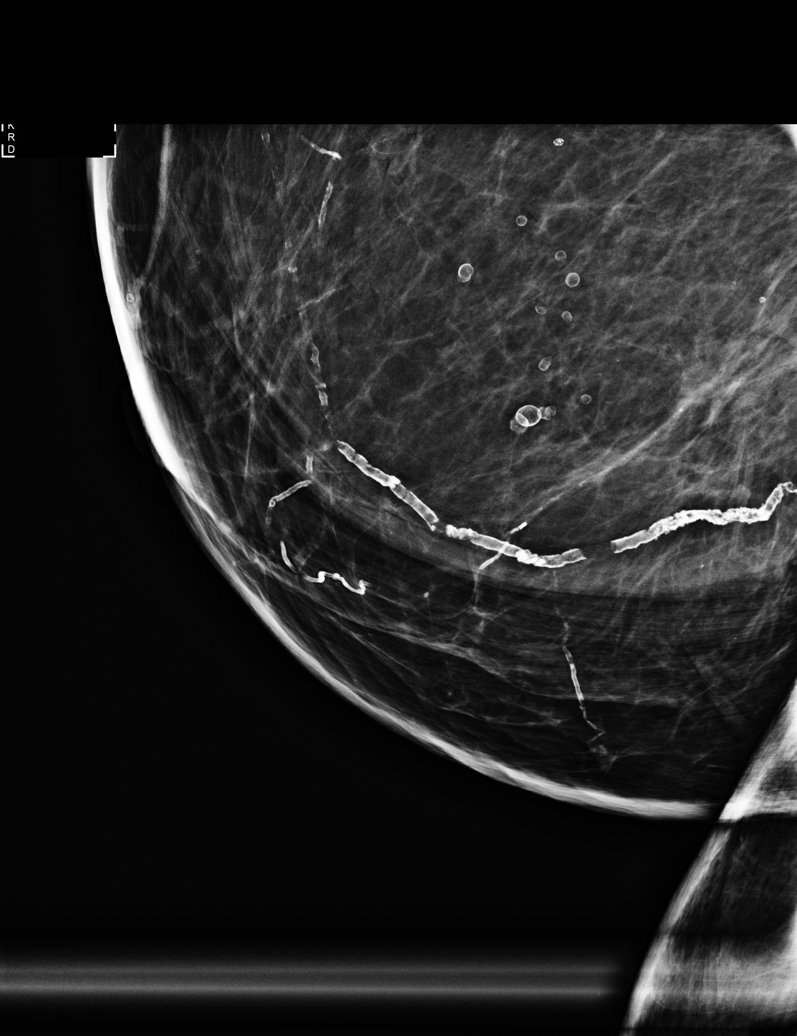

[R MLO synth-2D]
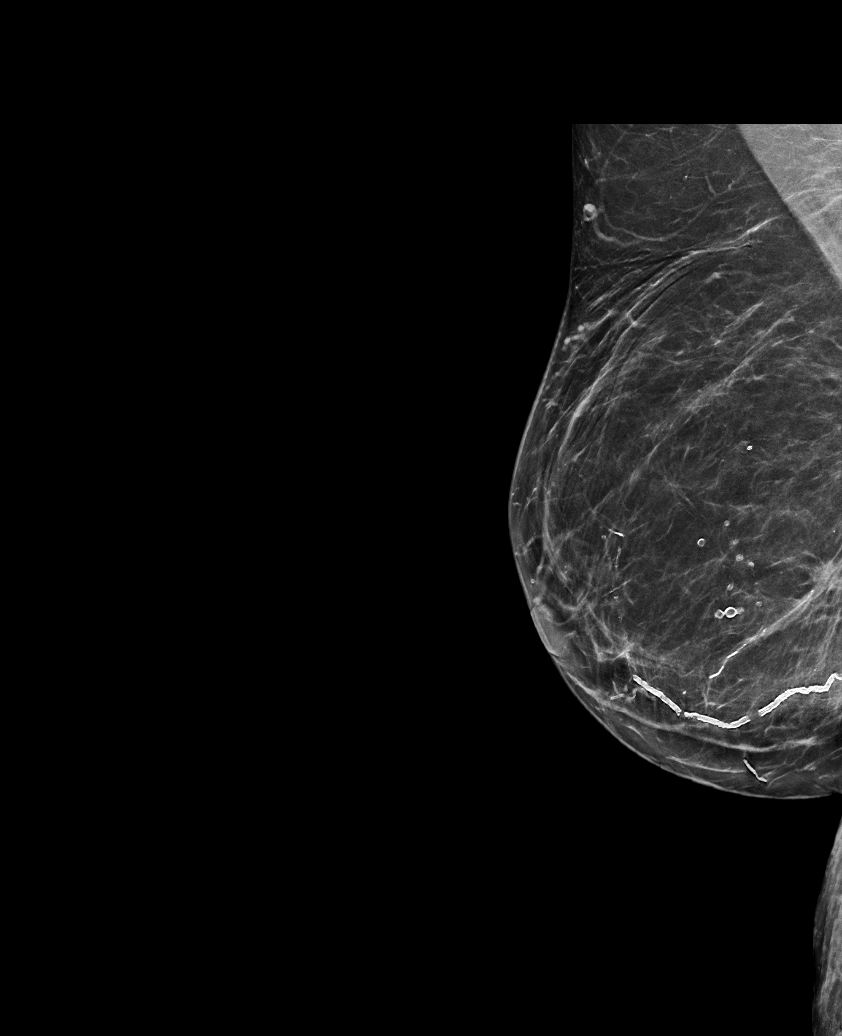

[L CC synth-2D]
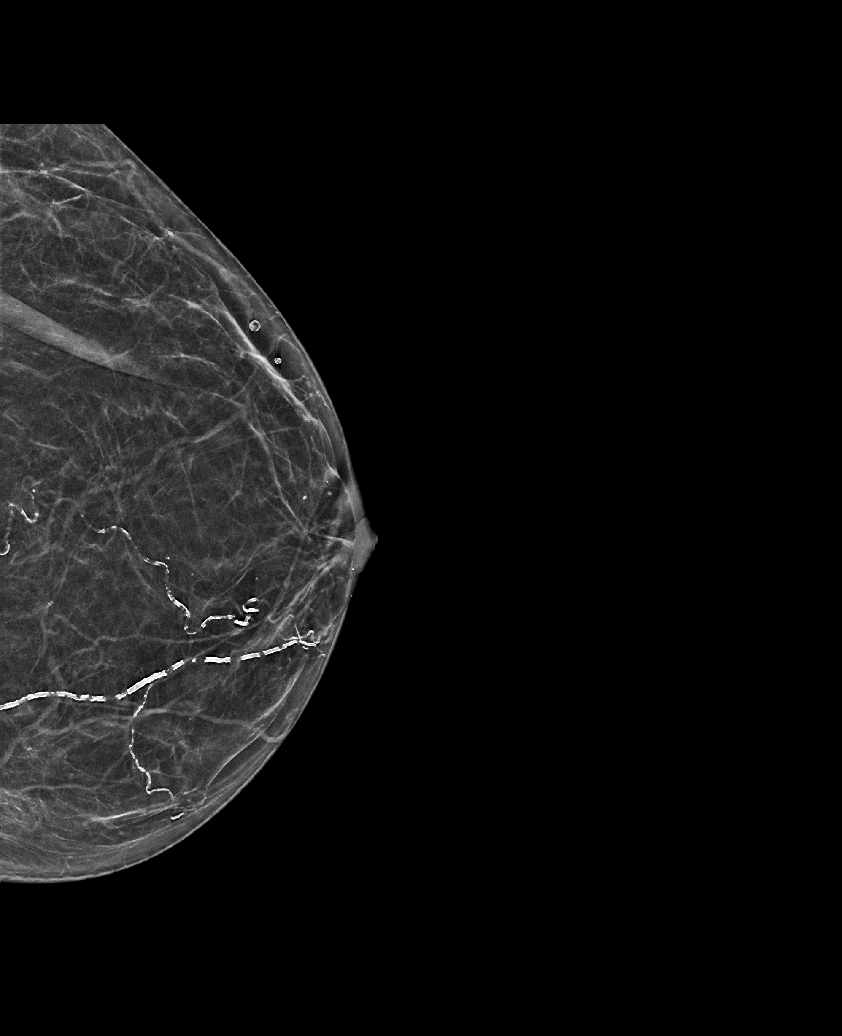

[L MLO synth-2D]
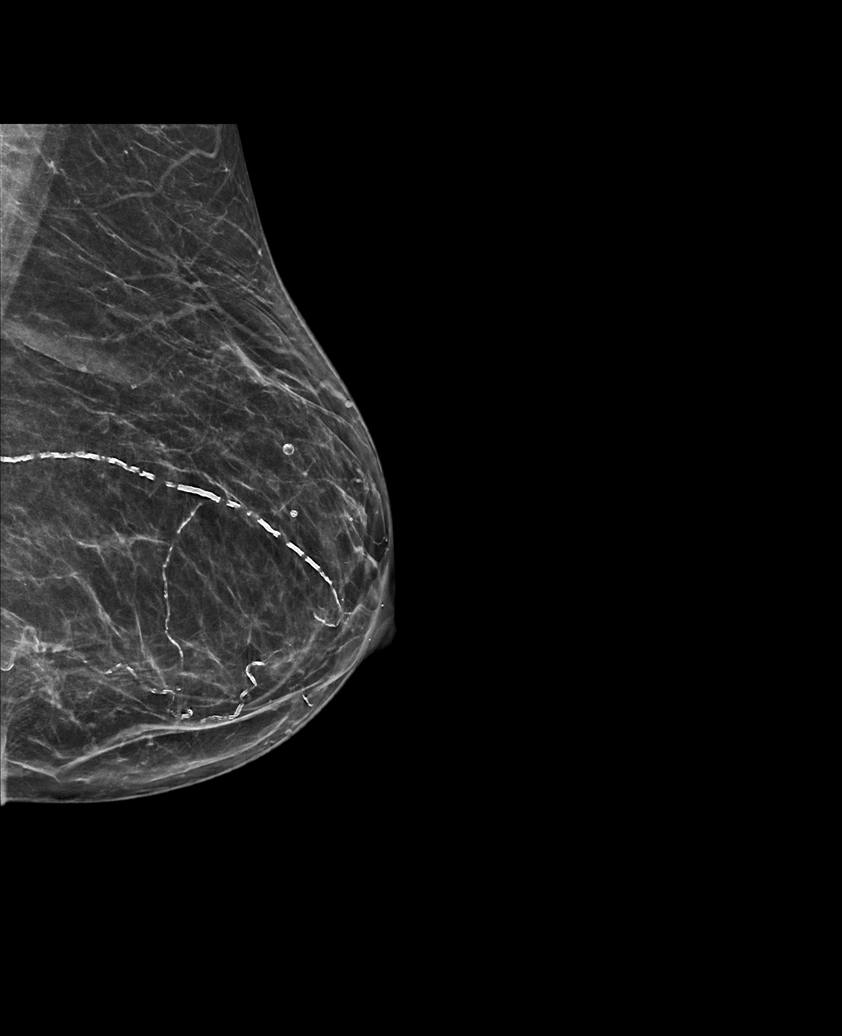

[R CC synth-2D]
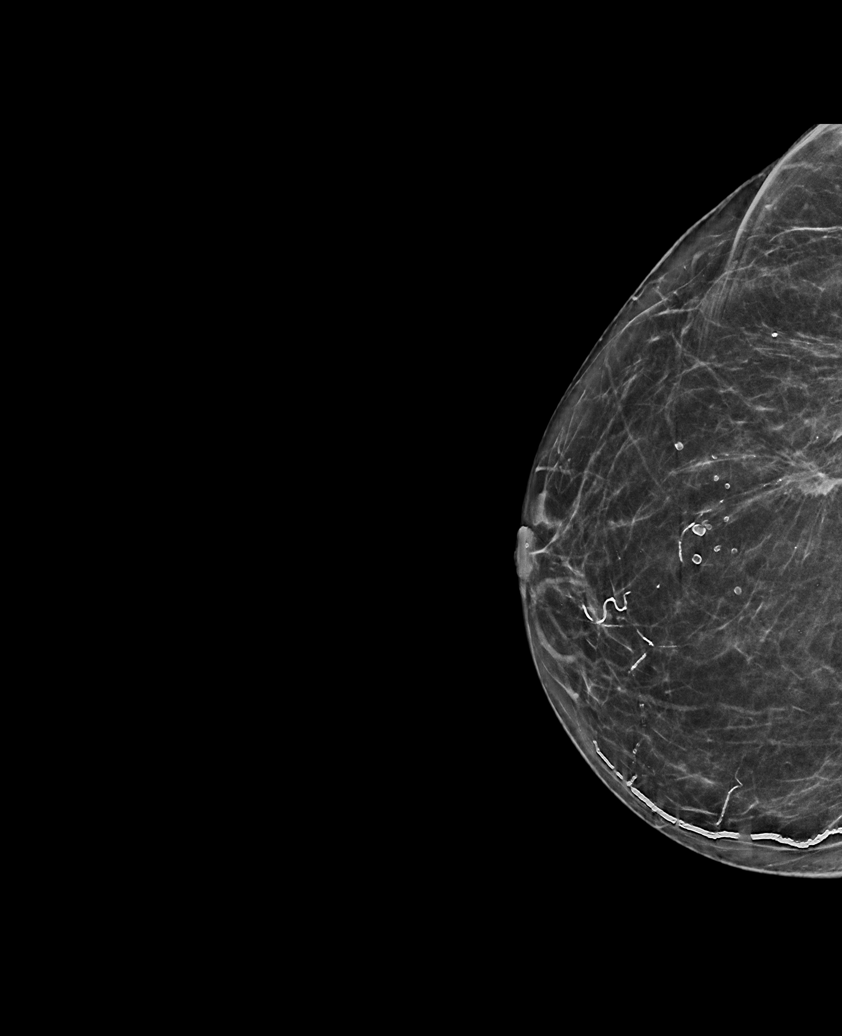

[L CC tomo · tomo slice 25/50.0]
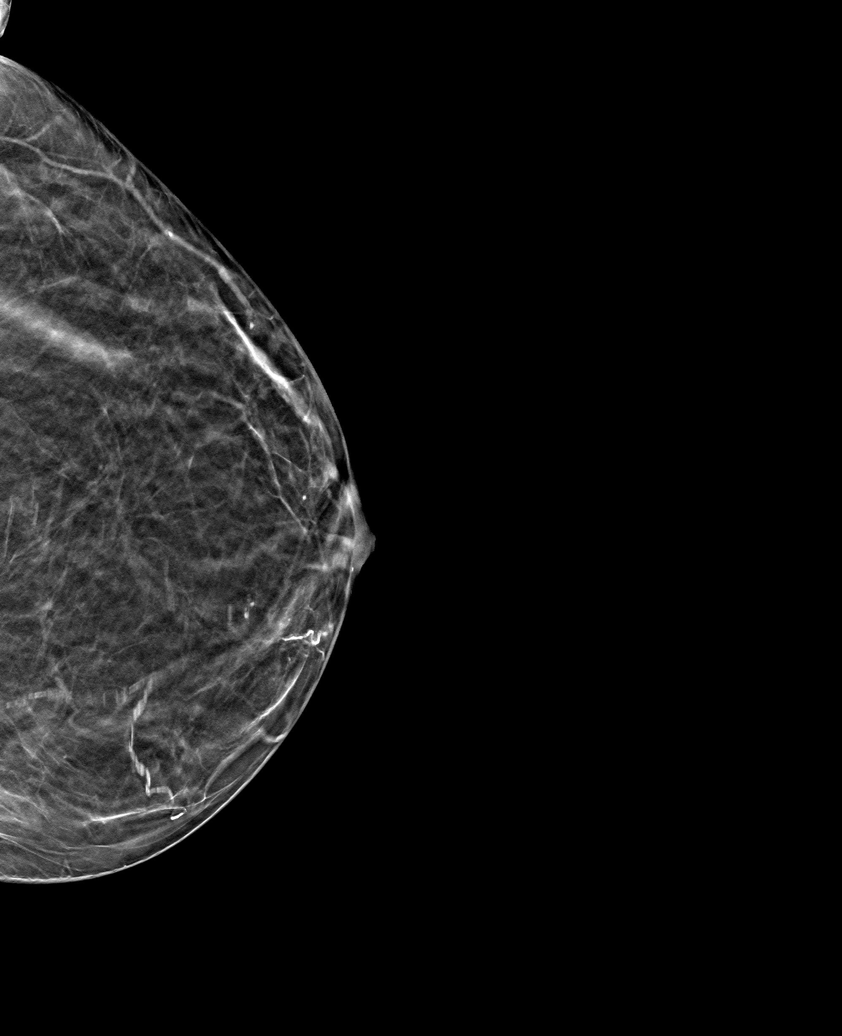

[6 of 25 positions shown; findings below may reference images not displayed]

ACR Breast Density Category b: There are scattered areas of
fibroglandular density.
FINDINGS: The right lumpectomy site is stable. No suspicious masses,
calcifications, or distortion.

Mammographic images were processed with CAD.
IMPRESSION: No mammographic evidence of malignancy.

RECOMMENDATION:
Annual diagnostic mammography.

I have discussed the findings and recommendations with the patient.
Results were also provided in writing at the conclusion of the
visit. If applicable, a reminder letter will be sent to the patient
regarding the next appointment.

BI-RADS CATEGORY  2: Benign.

## 2018-05-29 NOTE — Progress Notes (Signed)
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  INTERVAL HISTORY: Patient returns to clinic today for routine six-month follow-up.  She continues to feel well and remains asymptomatic.  She is tolerating letrozole well without significant side effects. She has no neurologic complaints. She denies any recent fevers or illnesses. She has a good appetite and denies weight loss. She denies any pain. She denies any chest pain or shortness of breath. She has no nausea, vomiting, constipation, or diarrhea. She has no urinary complaints.  Patient offers no specific complaints today.  REVIEW OF SYSTEMS:   Review of Systems  Constitutional: Negative.  Negative for fever, malaise/fatigue and weight loss.  Respiratory: Negative.  Negative for cough and shortness of breath.   Cardiovascular: Negative.  Negative for chest pain and leg swelling.  Gastrointestinal: Negative.  Negative for abdominal pain and constipation.  Genitourinary: Negative.  Negative for dysuria.  Musculoskeletal: Negative.  Negative for myalgias.  Skin: Negative.  Negative for rash.  Neurological: Negative.  Negative for sensory change, focal weakness, weakness and headaches.  Psychiatric/Behavioral: Negative.  The patient is not nervous/anxious.     As per HPI. Otherwise, a complete review of systems is negative.   PAST MEDICAL HISTORY: Past Medical History:  Diagnosis Date  . Cancer [facility])    breast  . Carcinoma of right breast ([place]) 05/20/2014  . Diabetes mellitus without complication ([place])   . Hypertension   . Lymphedema   . Personal history of radiation therapy 2015     Right breast  . PONV (postoperative nausea and vomiting)   . Psoriasis 1990  . Stroke ([place]) 2000   residual left arm weakness  . Varicose veins     PAST SURGICAL HISTORY: Past Surgical History:  Procedure Laterality Date  . AMPUTATION TOE Right 01/25/2017   Procedure: AMPUTATION TOE   ;  Surgeon: Albertine Patricia, DPM;  Location: [facility] ORS;  Service: Podiatry;  Laterality: Right;  . BREAST EXCISIONAL BIOPSY Right 2015   positive, with radiation  . BREAST LUMPECTOMY Right 04-2014   followed by radiation,  no chemo  . BREAST REDUCTION SURGERY  1977  . CHOLECYSTECTOMY N/A 09/23/2015   Procedure: LAPAROSCOPIC CHOLECYSTECTOMY;  Surgeon: Bonner Puna, MD;  Location: [facility] ORS;  Service: General;  Laterality: N/A;  . DILATION AND CURETTAGE OF UTERUS  2010  . EYE SURGERY Left 2015   cataracts and left eye detached retina repair  . EYE SURGERY Right 2013   cataract with len implant  . GASTRIC ROUX-EN-Y N/A 09/23/2015   Procedure: LAPAROSCOPIC ROUX-EN-Y GASTRIC;  Surgeon: Bonner Puna, MD;  Location: [facility] OR[facility];  Service: General;  Laterality: N/A;  . HIATAL HERNIA REPAIR N/A 09/23/2015   Procedure: LAPAROSCOPIC REPAIR OF HIATAL HERNIA;  Surgeon: Bonner Puna, MD;  Location: [facility] OR[facility];  Service: General;  Laterality: N/A;  . IRRIGATION AND DEBRIDEMENT FOOT Right 01/25/2017   Procedure: IRRIGATION AND DEBRIDEMENT FOOT;  Surgeon: Albertine Patricia, DPM;  Location: [facility];  Service: Podiatry;  Laterality: Right;  . IRRIGATION AND DEBRIDEMENT FOOT Right 01/29/2017   Procedure: IRRIGATION AND DEBRIDEMENT FOOT and application of wound vac;  Surgeon: Albertine Patricia, DPM;  Location: [facility];  Service: Podiatry;  Laterality: Right;  . LOWER EXTREMITY ANGIOGRAPHY Right 01/28/2017   Procedure: Lower Extremity Angiography;  Surgeon: Katha Cabal, MD;  Location: [place] [facility];  Service: Cardiovascular;  Laterality: Right;  . LOWER EXTREMITY INTERVENTION  01/28/2017   Procedure: Lower Extremity  Intervention;  Surgeon: Katha Cabal, MD;  Location: [place] [facility];  Service: Cardiovascular;;  . REDUCTION MAMMAPLASTY      FAMILY HISTORY Family History  Problem Relation Age of Onset  . Diabetes Mother   . Cancer Mother   . Breast cancer Mother 64  . Diabetes Father   . Diabetes Sister   . Diabetes Brother     GYNECOLOGIC HISTORY:  No LMP recorded. Patient is postmenopausal.     ADVANCED DIRECTIVES:    HEALTH MAINTENANCE: Social History   Tobacco Use  . Smoking status: Never Smoker  . Smokeless tobacco: Never Used  Substance Use Topics  . Alcohol use: No    Alcohol/week: 0.0 oz  . Drug use: No    No Known Allergies  Current Outpatient Medications  Medication Sig Dispense Refill  . albuterol (PROVENTIL HFA;VENTOLIN HFA) 108 (90 BASE) MCG/ACT inhaler Inhale 2 puffs into the lungs every 6 (six) hours as needed for wheezing or shortness of breath.    . calcium-vitamin D (OSCAL WITH D) 500-200 MG-UNIT tablet Take 2 tablets by mouth daily with breakfast. 180 tablet 3  . clopidogrel (PLAVIX) 75 MG tablet Take 75 mg by mouth.    . furosemide (LASIX) 20 MG tablet Take 20 mg by mouth daily.    [name] gabapentin (NEURONTIN) 300 MG capsule Take 300 mg by mouth 3 (three) times daily.     [name] letrozole (FEMARA) 2.5 MG tablet Take 1 tablet (2.5 mg total) by mouth daily. 90 tablet 3  . linagliptin (TRADJENTA) 5 MG TABS tablet Take 5 mg by mouth daily.    [name] lisinopril (PRINIVIL,ZESTRIL) 20 MG tablet Take 20 mg by mouth daily.    . metFORMIN (GLUCOPHAGE) 1000 MG tablet Take 1,000 mg by mouth 2 (two) times daily with a meal.    . nystatin (MYCOSTATIN/NYSTOP) powder Apply topically 4 (four) times daily. 15 g 0  . PARoxetine (PAXIL) 40 MG tablet Take 40 mg by mouth daily.    . pravastatin (PRAVACHOL) 40 MG tablet Take 40 mg by mouth daily. In afternoon     No current facility-administered medications for this visit.     OBJECTIVE: BP 103/65 (BP Location: Left Arm, Patient  Position: Sitting)   Pulse 66   Temp (!) 97.3 F (36.3 C) (Tympanic)   Resp 16   Ht _0  (1.6 m)   Wt 191 lb 4.8 oz (86.8 kg)   BMI 33.89 kg/m    Body mass index is 33.89 kg/m.    ECOG FS:0 - Asymptomatic  General: Well-developed, well-nourished, no acute distress. Eyes: Pink conjunctiva, anicteric sclera. HEENT: Normocephalic, moist mucous membranes, clear oropharnyx.   Breast: Bilateral breast and axilla without lumps or masses. Lungs: Clear to auscultation bilaterally. Heart: Regular rate and rhythm. No rubs, murmurs, or gallops. Abdomen: Soft, nontender, nondistended. No organomegaly noted, normoactive bowel sounds. Musculoskeletal: No edema, cyanosis, or clubbing. Neuro: Alert, answering all questions appropriately. Cranial nerves grossly intact. Skin: No rashes or petechiae noted. Psych: Normal affect.  LAB RESULTS:  No visits with results within 3 Day(s) from this visit.  Latest known visit with results is:  Admission on 05/10/2018, Discharged on 05/11/2018  Component  Date Value Ref Range Status  . Sodium 05/09/2018 136  135 - 145 mmol/L Final  . Potassium 05/09/2018 4.1  3.5 - 5.1 mmol/L Final   HEMOLYSIS AT THIS LEVEL MAY AFFECT RESULT  . Chloride 05/09/2018 103  98 - 111 mmol/L Final   Please note change in reference range.  . CO2 05/09/2018 22  22 - 32 mmol/L Final  . Glucose, Bld 05/09/2018 229* 70 - 99 mg/dL Final   Please note change in reference range.  . BUN 05/09/2018 27* 8 - 23 mg/dL Final   Please note change in reference range.  . Creatinine, Ser 05/09/2018 1.27* 0.44 - 1.00 mg/dL Final  . Calcium 05/09/2018 8.6* 8.9 - 10.3 mg/dL Final  . GFR calc non Af Amer 05/09/2018 43* >60 mL/min Final  . GFR calc Af Amer 05/09/2018 50* >60 mL/min Final   Comment: (NOTE) The eGFR has been calculated using the CKD EPI equation. This calculation has not been validated in all clinical situations. eGFR's persistently <60 mL/min signify possible Chronic  Kidney Disease.   Georgiann Hahn gap 05/09/2018 11  5 - 15 Final   Performed at [facility], [address] [place], [place] [address]  . WBC 05/09/2018 17.8* 3.6 - 11.0 K/uL Final  . RBC 05/09/2018 4.08  3.80 - 5.20 MIL/uL Final  . Hemoglobin 05/09/2018 11.6* 12.0 - 16.0 g/dL Final  . HCT 05/09/2018 35.0  35.0 - 47.0 % Final  . MCV 05/09/2018 85.6  80.0 - 100.0 fL Final  . MCH 05/09/2018 28.5  26.0 - 34.0 pg Final  . MCHC 05/09/2018 33.3  32.0 - 36.0 g/dL Final  . RDW 05/09/2018 13.0  11.5 - 14.5 % Final  . Platelets 05/09/2018 192  150 - 440 K/uL Final   Performed at [facility], [address] [place], [place] [address]  . Color, Urine 05/10/2018 AMBER* YELLOW Final   BIOCHEMICALS MAY BE AFFECTED BY COLOR  . APPearance 05/10/2018 CLOUDY* CLEAR Final  . Specific Gravity, Urine 05/10/2018 1.023  1.005 - 1.030 Final  . pH 05/10/2018 5.0  5.0 - 8.0 Final  . Glucose, UA 05/10/2018 NEGATIVE  NEGATIVE mg/dL Final  . Hgb urine dipstick 05/10/2018 LARGE* NEGATIVE Final  . Bilirubin Urine 05/10/2018 NEGATIVE  NEGATIVE Final  . Ketones, ur 05/10/2018 NEGATIVE  NEGATIVE mg/dL Final  . Protein, ur 05/10/2018 100* NEGATIVE mg/dL Final  . Nitrite 05/10/2018 POSITIVE* NEGATIVE Final  . Leukocytes, UA 05/10/2018 SMALL* NEGATIVE Final  . RBC / HPF 05/10/2018 11-20  0 - 5 RBC/hpf Final  . WBC, UA 05/10/2018 11-20  0 - 5 WBC/hpf Final  . Bacteria, UA 05/10/2018 MANY* NONE SEEN Final  . Squamous Epithelial / LPF 05/10/2018 0-5  0 - 5 Final  . Mucus 05/10/2018 PRESENT   Final  . Amorphous Crystal 05/10/2018 PRESENT   Final   Performed at [facility], [address] [place], [place] [address]  . Troponin I 05/09/2018 0.03* <0.03 ng/mL Final   Comment: CRITICAL RESULT CALLED TO, READ BACK BY AND VERIFIED WITH CHERIE ALLISON _0  05/09/18 [facility] Performed at [facility], [address], [place], [place] [address]   . Specimen Description 05/10/2018 BLOOD R AC    Final  . Special Requests 05/10/2018 BOTTLES DRAWN AEROBIC AND ANAEROBIC Blood Culture adequate volume   Final  . Culture 05/10/2018    Final                   Value:NO GROWTH  5 DAYS Performed at [facility], [address], [place], [place] [address]   . Report Status 05/10/2018 05/15/2018 FINAL   Final  . Specimen Description 05/10/2018 BLOOD LAC   Final  . Special Requests 05/10/2018 BOTTLES DRAWN AEROBIC AND ANAEROBIC Blood Culture adequate volume   Final  . Culture 05/10/2018    Final                   Value:NO GROWTH 5 DAYS Performed at [facility], [address] [place], [place] [address]   . Report Status 05/10/2018 05/15/2018 FINAL   Final  . Lactic Acid, Venous 05/10/2018 3.1* 0.5 - 1.9 mmol/L Final   Comment: CRITICAL RESULT CALLED TO, READ BACK BY AND VERIFIED WITH BUTCH WOODS _0  05/10/18 [facility] Performed at [facility], [address], [place], [place] [address]   . Lactic Acid, Venous 05/10/2018 2.4* 0.5 - 1.9 mmol/L Final   Comment: CRITICAL RESULT CALLED TO, READ BACK BY AND VERIFIED WITH JENNIFER INGERSOL _1  05/10/18 [facility] Performed at [facility], [address] [place], [place] [address]   . Specimen Description 05/10/2018    Final                   Value:URINE, RANDOM Performed at [facility], [address] [place], [place] [address]   . Special Requests 05/10/2018    Final                   Value:NONE Performed at [facility], [address] [place], [place] [address]   . Culture 05/10/2018 *  Final                   Value:<10,000 COLONIES/mL INSIGNIFICANT GROWTH Performed at [facility] [address] [place], [place] [address]   . Report Status 05/10/2018 05/11/2018 FINAL   Final  . TSH 05/10/2018 0.648  0.350 - 4.500 uIU/mL Final   Comment: Performed by a 3rd Generation assay with a functional sensitivity of <=0.01 uIU/mL. Performed at [facility], [address], [place], [place] [address]   . Hgb A1c MFr Bld 05/10/2018 6.5* 4.8 - 5.6 % Final   Comment: (NOTE) Pre diabetes:          5.7%-6.4% Diabetes:              >6.4% Glycemic control for   <7.0% adults with diabetes   . Mean Plasma Glucose 05/10/2018 139.85  mg/dL Final   Performed at [facility] [address], [place], [place] [address]  . Glucose-Capillary 05/10/2018 203* 70 - 99 mg/dL Final  . Comment 1 05/10/2018 Notify RN   Final  . Comment 2 05/10/2018 Document in Chart   Final  . Lactic Acid, Venous 05/10/2018 1.3  0.5 - 1.9 mmol/L Final   Performed at [facility], [address] [place], [place] [address]  . Glucose-Capillary 05/10/2018 142* 70 - 99 mg/dL Final  . Comment 1 05/10/2018 Notify RN   Final  . Comment 2 05/10/2018 Document in Chart   Final  . Specimen Description 05/10/2018    Final                   Value:NOSE Performed at [facility], [address] [place], [place] [address]   . Special Requests 05/10/2018    Final  Value:NONE Performed at [facility], [address], [place], [place] [address]   . Culture 05/10/2018    Final                   Value:NO MRSA DETECTED Performed at [facility], [place] [address], [place], [place] [address]   . Report Status 05/10/2018 05/12/2018 FINAL   Final  . Glucose-Capillary 05/10/2018 158* 70 - 99 mg/dL Final  . Comment 1 05/10/2018 Notify RN   Final  . Comment 2 05/10/2018 Document in Chart   Final  . Glucose-Capillary 05/10/2018 126* 70 - 99 mg/dL Final  . Comment 1 05/10/2018 Notify RN   Final  . Comment 2 05/10/2018 Document in Chart   Final  . Glucose-Capillary 05/11/2018 116* 70 - 99 mg/dL Final  . WBC 05/11/2018 10.8  3.6 - 11.0 K/uL Final  . RBC 05/11/2018 3.81  3.80 - 5.20 MIL/uL Final  . Hemoglobin 05/11/2018 10.9* 12.0 - 16.0 g/dL Final  . HCT 05/11/2018 32.8* 35.0 - 47.0 % Final  . MCV 05/11/2018 86.1  80.0 - 100.0 fL Final  . MCH 05/11/2018  28.5  26.0 - 34.0 pg Final  . MCHC 05/11/2018 33.1  32.0 - 36.0 g/dL Final  . RDW 05/11/2018 13.3  11.5 - 14.5 % Final  . Platelets 05/11/2018 145* 150 - 440 K/uL Final   Performed at [facility], [address] [place],  [address]  . Glucose-Capillary 05/11/2018 132* 70 - 99 mg/dL Final    STUDIES: No results found.  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  PLAN:    1. 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 Continue letrozole for total 5 years completing in July 2020.  Patient's most recent mammogram on May 29, 2018 was reported as BI-RADS 2.  Repeat in July 2020.  Return to clinic in 6 months for routine evaluation.   2. Postmenopausal: Patient's most recent bone mineral density on May 26, 2017 reported T score of -0.1 which is normal and unchanged from one year prior.  Repeat in July 2020.  I spent a total of 20 minutes face-to-face with the patient of which greater than 50% of the visit was spent in counseling and coordination of care as detailed above.   Patient expressed understanding and was in agreement with this plan. She also understands that She can call clinic at any time with any questions, concerns, or complaints.    Lloyd Huger, MD   06/03/2018 11:06 PM

## 2018-06-01 ENCOUNTER — Inpatient Hospital Stay: Payer: Medicare HMO | Attending: Oncology | Admitting: Oncology

## 2018-06-01 ENCOUNTER — Encounter: Payer: Self-pay | Admitting: Oncology

## 2018-06-01 ENCOUNTER — Other Ambulatory Visit: Payer: Self-pay

## 2018-06-01 VITALS — BP 103/65 | HR 66 | Temp 97.3°F | Resp 16 | Ht 63.0 in | Wt 191.3 lb

## 2018-06-01 DIAGNOSIS — I890 Lymphedema, not elsewhere classified: Secondary | ICD-10-CM | POA: Diagnosis not present

## 2018-06-01 DIAGNOSIS — Z803 Family history of malignant neoplasm of breast: Secondary | ICD-10-CM | POA: Diagnosis not present

## 2018-06-01 DIAGNOSIS — E119 Type 2 diabetes mellitus without complications: Secondary | ICD-10-CM | POA: Insufficient documentation

## 2018-06-01 DIAGNOSIS — Z170 Estrogen receptor positive status [ER+]: Secondary | ICD-10-CM | POA: Diagnosis not present

## 2018-06-01 DIAGNOSIS — Z7984 Long term (current) use of oral hypoglycemic drugs: Secondary | ICD-10-CM

## 2018-06-01 DIAGNOSIS — I10 Essential (primary) hypertension: Secondary | ICD-10-CM | POA: Diagnosis not present

## 2018-06-01 DIAGNOSIS — Z79899 Other long term (current) drug therapy: Secondary | ICD-10-CM | POA: Insufficient documentation

## 2018-06-01 DIAGNOSIS — Z79811 Long term (current) use of aromatase inhibitors: Secondary | ICD-10-CM | POA: Diagnosis not present

## 2018-06-01 DIAGNOSIS — C50511 Malignant neoplasm of lower-outer quadrant of right female breast: Secondary | ICD-10-CM | POA: Diagnosis not present

## 2018-06-01 DIAGNOSIS — Z8673 Personal history of transient ischemic attack (TIA), and cerebral infarction without residual deficits: Secondary | ICD-10-CM

## 2018-06-01 NOTE — Progress Notes (Signed)
Patient here for follow up. No changes since her last appt. She has had a mammogram completed this week but has not had a breast exam.

## 2018-07-18 ENCOUNTER — Ambulatory Visit (INDEPENDENT_AMBULATORY_CARE_PROVIDER_SITE_OTHER): Payer: Medicare HMO | Admitting: Nurse Practitioner

## 2018-07-18 ENCOUNTER — Encounter (INDEPENDENT_AMBULATORY_CARE_PROVIDER_SITE_OTHER): Payer: Self-pay | Admitting: Vascular Surgery

## 2018-07-18 VITALS — BP 123/73 | HR 60 | Resp 15 | Ht 63.0 in | Wt 190.0 lb

## 2018-07-18 DIAGNOSIS — E669 Obesity, unspecified: Secondary | ICD-10-CM

## 2018-07-18 DIAGNOSIS — L03115 Cellulitis of right lower limb: Secondary | ICD-10-CM | POA: Diagnosis not present

## 2018-07-18 DIAGNOSIS — E1169 Type 2 diabetes mellitus with other specified complication: Secondary | ICD-10-CM

## 2018-07-18 DIAGNOSIS — I10 Essential (primary) hypertension: Secondary | ICD-10-CM

## 2018-07-18 DIAGNOSIS — I739 Peripheral vascular disease, unspecified: Secondary | ICD-10-CM | POA: Diagnosis not present

## 2018-07-18 DIAGNOSIS — I872 Venous insufficiency (chronic) (peripheral): Secondary | ICD-10-CM | POA: Diagnosis not present

## 2018-07-18 NOTE — Progress Notes (Signed)
Subjective:    Patient ID: Elizabeth [name], female    DOB: 1951/03/23, 67 y.o.   MRN: [id number] Chief Complaint  Patient presents with  . New Patient (Initial Visit)    Cellulitis    HPI  Elizabeth [name] is a 67 y.o. female who presents Patient is seen for evaluation of leg swelling.  The patient has had multiple recurrences of cellulitis of her right lower extremity.  This most recent episode was accompanied by a UTI, for which she spent several nights in the hospital for possible sepsis.  Her primary care provider notes that this episode of cellulitis has been poor and slow to resolve.  The cellulitis is resolved after a course of Keflex, followed by a 17-day course of doxycycline.   The patient first noticed the swelling remotely but is now concerned because of a significant increase in the overall edema. The swelling is associated with pain and discoloration. The patient notes that in the morning the legs are significantly improved but they steadily worsened throughout the course of the day. Elevation makes the legs better, dependency makes them much worse.   There is no history of ulcerations associated with the swelling.   The patient denies any recent changes in their medications.  The patient has not been wearing graduated compression regularly  The patient underwent an angiogram of the right lower extremity on 01/28/2017.  She received an angioplasty of the anterior tibial artery.  The patient denies a history of DVT or PE. There is no prior history of phlebitis. There is no history of primary lymphedema.  There is no history of radiation treatment to the groin or pelvis No history of malignancies. No history of trauma or groin or pelvic surgery. No history of foreign travel or parasitic infections area     Constitutional: [] Weight loss  [] Fever  [] Chills Cardiac: [] Chest pain   [] Chest pressure   [] Palpitations   [] Shortness of breath when laying flat   [] Shortness of  breath with exertion. Vascular:  [] Pain in legs with walking   [] Pain in legs with standing  [] History of DVT   [] Phlebitis   [x] Swelling in legs   [] Varicose veins   [] Non-healing ulcers Pulmonary:   [] Uses home oxygen   [] Productive cough   [] Hemoptysis   [] Wheeze  [] COPD   [] Asthma Neurologic:  [] Dizziness   [] Seizures   [x] History of stroke   [] History of TIA  [] Aphasia   [] Vissual changes   [] Weakness or numbness in arm   [] Weakness or numbness in leg Musculoskeletal:   [] Joint swelling   [] Joint pain   [] Low back pain Hematologic:  [] Easy bruising  [] Easy bleeding   [] Hypercoagulable state   [] Anemic Gastrointestinal:  [] Diarrhea   [] Vomiting  [] Gastroesophageal reflux/heartburn   [] Difficulty swallowing. Genitourinary:  [] Chronic kidney disease   [] Difficult urination  [] Frequent urination   [] Blood in urine Skin:  [] Rashes   [x] Ulcers  Psychological:  [] History of anxiety   []  History of major depression.     Objective:   Physical Exam  BP 123/73 (BP Location: Left Arm, Patient Position: Sitting)   Pulse 60   Resp 15   Ht 5\' 3"  (1.6 m)   Wt 190 lb (86.2 kg)   BMI 33.66 kg/m   Past Medical History:  Diagnosis Date  . Cancer [facility])    breast  . Carcinoma of right breast ([place]) 05/20/2014  . Diabetes mellitus without complication ([place])   . Hypertension   . Lymphedema   .  Personal history of radiation therapy 2015   Right breast  . PONV (postoperative nausea and vomiting)   . Psoriasis 1990  . Stroke ([place]) 2000   residual left arm weakness  . Varicose veins      Gen: WD/WN, NAD Head: [place]/AT, No temporalis wasting.  Ear/Nose/Throat: Hearing grossly intact, nares w/o erythema or drainage.  Decreased vision in left Eyes: PER, EOMI, sclera nonicteric.  Neck: Supple, no masses.  No JVD.  Pulmonary:  Good air movement, no use of accessory muscles.  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  Venous stasis dermatitis on right lower extremity.   Recently resolved cellulitis of right lower extremity, 2+ pitting edema bilaterally Vessel Right Left  PT  not palpable  not palpable  DP  not palpable  not palpable  Gastrointestinal: soft, non-distended. No guarding/no peritoneal signs.  Musculoskeletal: M/S 5/5 throughout.   Neurologic: Pain and light touch intact in extremities.  Symmetrical.  Speech is fluent. Motor exam as listed above. Psychiatric: Judgment intact, Mood & affect appropriate for pt's clinical situation. Dermatologic: No Venous rashes. No Ulcers Noted.    Slightly erythematous area of previous site of cellulitis  lymph : No Cervical lymphadenopathy,  evidence of dermal thickening  Social History   Socioeconomic History  . Marital status: Married    Spouse name: Not on file  . Number of children: Not on file  . Years of education: Not on file  . Highest education level: Not on file  Occupational History  . Not on file  Social Needs  . Financial resource strain: Not on file  . Food insecurity:    Worry: Not on file    Inability: Not on file  . Transportation needs:    Medical: Not on file    Non-medical: Not on file  Tobacco Use  . Smoking status: Never Smoker  . Smokeless tobacco: Never Used  Substance and Sexual Activity  . Alcohol use: No    Alcohol/week: 0.0 standard drinks  . Drug use: No  . Sexual activity: Yes    Birth control/protection: Post-menopausal  Lifestyle  . Physical activity:    Days per week: Not on file    Minutes per session: Not on file  . Stress: Not on file  Relationships  . Social connections:    Talks on phone: Not on file    Gets together: Not on file    Attends religious service: Not on file    Active member of club or organization: Not on file    Attends meetings of clubs or organizations: Not on file    Relationship status: Not on file  . Intimate partner violence:    Fear of current or ex partner: Not on file    Emotionally abused: Not on file    Physically  abused: Not on file    Forced sexual activity: Not on file  Other Topics Concern  . Not on file  Social History Narrative  . Not on file    Past Surgical History:  Procedure Laterality Date  . AMPUTATION TOE Right 01/25/2017   Procedure: AMPUTATION TOE   ;  Surgeon: Albertine Patricia, DPM;  Location: [facility] OR[facility];  Service: Podiatry;  Laterality: Right;  . BREAST EXCISIONAL BIOPSY Right 2015   positive, with radiation  . BREAST LUMPECTOMY Right 04-2014   followed by radiation,  no chemo  . BREAST REDUCTION SURGERY  1977  . CHOLECYSTECTOMY N/A 09/23/2015   Procedure: LAPAROSCOPIC CHOLECYSTECTOMY;  Surgeon: Bonner Puna, MD;  Location: [facility] ORS;  Service: General;  Laterality: N/A;  . DILATION AND CURETTAGE OF UTERUS  2010  . EYE SURGERY Left 2015   cataracts and left eye detached retina repair  . EYE SURGERY Right 2013   cataract with len implant  . GASTRIC ROUX-EN-Y N/A 09/23/2015   Procedure: LAPAROSCOPIC ROUX-EN-Y GASTRIC;  Surgeon: Bonner Puna, MD;  Location: [facility];  Service: General;  Laterality: N/A;  . HIATAL HERNIA REPAIR N/A 09/23/2015   Procedure: LAPAROSCOPIC REPAIR OF HIATAL HERNIA;  Surgeon: Bonner Puna, MD;  Location: [facility];  Service: General;  Laterality: N/A;  . IRRIGATION AND DEBRIDEMENT FOOT Right 01/25/2017   Procedure: IRRIGATION AND DEBRIDEMENT FOOT;  Surgeon: Albertine Patricia, DPM;  Location: [facility];  Service: Podiatry;  Laterality: Right;  . IRRIGATION AND DEBRIDEMENT FOOT Right 01/29/2017   Procedure: IRRIGATION AND DEBRIDEMENT FOOT and application of wound vac;  Surgeon: Albertine Patricia, DPM;  Location: [facility];  Service: Podiatry;  Laterality: Right;  . LOWER EXTREMITY ANGIOGRAPHY Right 01/28/2017   Procedure: Lower Extremity Angiography;  Surgeon: Katha Cabal, MD;  Location: [place] [facility];  Service: Cardiovascular;  Laterality: Right;  . LOWER EXTREMITY INTERVENTION  01/28/2017   Procedure: Lower Extremity Intervention;  Surgeon: Katha Cabal,  MD;  Location: [place] [facility];  Service: Cardiovascular;;  . REDUCTION MAMMAPLASTY      Family History  Problem Relation Age of Onset  . Diabetes Mother   . Cancer Mother   . Breast cancer Mother 59  . Diabetes Father   . Diabetes Sister   . Diabetes Brother     No Known Allergies     Assessment & Plan:   1. Chronic venous insufficiency I have had a long discussion with the patient regarding swelling and why it  causes symptoms.  Patient will begin wearing graduated compression stockings class 1 (20-30 mmHg) on a daily basis a prescription was given. The patient will  beginning wearing the stockings first thing in the morning and removing them in the evening. The patient is instructed specifically not to sleep in the stockings.   In addition, behavioral modification will be initiated.  This will include frequent elevation, use of over the counter pain medications and exercise such as walking.  I have reviewed systemic causes for chronic edema such as liver, kidney and cardiac etiologies.  The patient denies problems with these organ systems.    Consideration for a lymph pump will also be made based upon the effectiveness of conservative therapy.  This would help to improve the edema control and prevent sequela such as ulcers and infections   Patient should undergo duplex ultrasound of the venous system to ensure that DVT or reflux is not present.  The patient will follow-up with me after the ultrasound.    - VAS [place] LOWER EXTREMITY VENOUS REFLUX; Future  2. Peripheral vascular disease ([place])  Recommend:  The patient has atypical pain symptoms for pure atherosclerotic disease. However, on physical exam there is evidence of mixed venous and arterial disease, given the diminished pulses and the edema associated with venous changes of the legs.  Noninvasive studies including ABI's and venous ultrasound of the legs will be obtained and the patient will follow up with me to  review these studies.  The patient should continue walking and begin a more formal exercise program. The patient should continue his antiplatelet therapy and aggressive treatment of the lipid abnormalities.  The  patient should begin wearing graduated compression socks 20-30 mmHg strength to control edema.  - VAS [place] ABI WITH/WO TBI; Future  3. Hypertension, unspecified type Continue antihypertensive medications as already ordered, these medications have been reviewed and there are no changes at this time.   4. Cellulitis of right lower extremity Cellulitis is currently resolved.  Depending on results of venous reflux study and ABI, further interventions to assist with swelling and decreased likelihood of recurrence of cellulitis will be considered including endovenous laser ablation injection or lymphedema pump.  5. Diabetes mellitus type 2 in obese [facility]) Continue hypoglycemic medications as already ordered, these medications have been reviewed and there are no changes at this time.  Hgb A1C to be monitored as already arranged by primary service    Current Outpatient Medications on File Prior to Visit  Medication Sig Dispense Refill  . albuterol (PROVENTIL HFA;VENTOLIN HFA) 108 (90 BASE) MCG/ACT inhaler Inhale 2 puffs into the lungs every 6 (six) hours as needed for wheezing or shortness of breath.    [name] amoxicillin-clavulanate (AUGMENTIN) 875-125 MG tablet amoxicillin 875 mg-potassium clavulanate 125 mg tablet    . calcium-vitamin D (OSCAL WITH D) 500-200 MG-UNIT tablet Take 2 tablets by mouth daily with breakfast. 180 tablet 3  . clopidogrel (PLAVIX) 75 MG tablet Take 75 mg by mouth.    . erythromycin ophthalmic ointment erythromycin 5 mg/gram (0.5 %) eye ointment  APPLY 1 CM RIBBON INTO THE LOWER CONJUNCTIVAL SAC in the LEFT EYE BY OPHTHALMIC ROUTE 2-3 TIMES PER DAY PRN    . fluticasone (FLONASE) 50 MCG/ACT nasal spray fluticasone propionate 50 mcg/actuation nasal spray,suspension      . furosemide (LASIX) 20 MG tablet Take 20 mg by mouth daily.    [name] gabapentin (NEURONTIN) 300 MG capsule Take 300 mg by mouth 3 (three) times daily.     . insulin NPH-regular Human (NOVOLIN 70/30) (70-30) 100 UNIT/ML injection Novolin 70/30 U-100 Insulin    . letrozole (FEMARA) 2.5 MG tablet Take 1 tablet (2.5 mg total) by mouth daily. 90 tablet 3  . linagliptin (TRADJENTA) 5 MG TABS tablet Take 5 mg by mouth daily.    [name] lisinopril (PRINIVIL,ZESTRIL) 20 MG tablet Take 20 mg by mouth daily.    . metFORMIN (GLUCOPHAGE) 1000 MG tablet Take 1,000 mg by mouth 2 (two) times daily with a meal.    . nystatin (MYCOSTATIN/NYSTOP) powder Apply topically 4 (four) times daily. 15 g 0  . PARoxetine (PAXIL) 40 MG tablet Take 40 mg by mouth daily.    . potassium chloride (MICRO-K) 10 MEQ CR capsule potassium chloride ER 10 mEq capsule,extended release    . pravastatin (PRAVACHOL) 40 MG tablet Take 40 mg by mouth daily. In afternoon     No current facility-administered medications on file prior to visit.     There are no Patient Instructions on file for this visit. No follow-ups on file.   Kris Hartmann, NP

## 2018-10-18 ENCOUNTER — Other Ambulatory Visit (INDEPENDENT_AMBULATORY_CARE_PROVIDER_SITE_OTHER): Payer: Self-pay | Admitting: Vascular Surgery

## 2018-10-18 DIAGNOSIS — M7989 Other specified soft tissue disorders: Principal | ICD-10-CM

## 2018-10-18 DIAGNOSIS — M79606 Pain in leg, unspecified: Secondary | ICD-10-CM

## 2018-10-19 ENCOUNTER — Encounter (INDEPENDENT_AMBULATORY_CARE_PROVIDER_SITE_OTHER): Payer: Self-pay | Admitting: Vascular Surgery

## 2018-10-19 ENCOUNTER — Other Ambulatory Visit (INDEPENDENT_AMBULATORY_CARE_PROVIDER_SITE_OTHER): Payer: Self-pay | Admitting: Nurse Practitioner

## 2018-10-19 ENCOUNTER — Other Ambulatory Visit: Payer: Self-pay

## 2018-10-19 ENCOUNTER — Other Ambulatory Visit (INDEPENDENT_AMBULATORY_CARE_PROVIDER_SITE_OTHER): Payer: Self-pay | Admitting: Vascular Surgery

## 2018-10-19 ENCOUNTER — Ambulatory Visit (INDEPENDENT_AMBULATORY_CARE_PROVIDER_SITE_OTHER): Payer: Medicare HMO | Admitting: Vascular Surgery

## 2018-10-19 ENCOUNTER — Ambulatory Visit (INDEPENDENT_AMBULATORY_CARE_PROVIDER_SITE_OTHER): Payer: Medicare HMO

## 2018-10-19 VITALS — BP 163/74 | HR 65 | Resp 16 | Ht 63.0 in | Wt 198.0 lb

## 2018-10-19 DIAGNOSIS — E782 Mixed hyperlipidemia: Secondary | ICD-10-CM

## 2018-10-19 DIAGNOSIS — I739 Peripheral vascular disease, unspecified: Secondary | ICD-10-CM

## 2018-10-19 DIAGNOSIS — E1142 Type 2 diabetes mellitus with diabetic polyneuropathy: Secondary | ICD-10-CM

## 2018-10-19 DIAGNOSIS — I10 Essential (primary) hypertension: Secondary | ICD-10-CM

## 2018-10-19 DIAGNOSIS — I872 Venous insufficiency (chronic) (peripheral): Secondary | ICD-10-CM

## 2018-10-19 DIAGNOSIS — Z794 Long term (current) use of insulin: Secondary | ICD-10-CM

## 2018-10-19 DIAGNOSIS — M79606 Pain in leg, unspecified: Secondary | ICD-10-CM

## 2018-10-19 DIAGNOSIS — M7989 Other specified soft tissue disorders: Principal | ICD-10-CM

## 2018-10-19 NOTE — Progress Notes (Signed)
MRN : [id number]  Elizabeth [name] is a 67 y.o. (11-Aug-1951) female who presents with chief complaint of  Chief Complaint  Patient presents with  . Follow-up    ultrasound  .  History of Present Illness:   The patient returns to the office for followup evaluation regarding leg pain and swelling.  The swelling has improved some and the pain associated with swelling has decreased but is not gone completely. There have not been any interval development of a ulcerations or wounds.  Since the previous visit the patient has been wearing graduated compression stockings and has noted little significant improvement in the lymphedema. The patient has been using compression routinely morning until night.  The patient also states elevation during the day and exercise is being done too.  ABI's  Rt=0.92 and Lt=1.07 (triphasic AT signals bilaterally) Venous duplex shows mild deep venous reflux bilaterally, no superficial reflux    Current Meds  Medication Sig  . albuterol (PROVENTIL HFA;VENTOLIN HFA) 108 (90 BASE) MCG/ACT inhaler Inhale 2 puffs into the lungs every 6 (six) hours as needed for wheezing or shortness of breath.  . calcium-vitamin D (OSCAL WITH D) 500-200 MG-UNIT tablet Take 2 tablets by mouth daily with breakfast.  . clopidogrel (PLAVIX) 75 MG tablet Take 75 mg by mouth.  . furosemide (LASIX) 20 MG tablet Take 20 mg by mouth daily.  [name] gabapentin (NEURONTIN) 300 MG capsule Take 300 mg by mouth 3 (three) times daily.   [name] letrozole (FEMARA) 2.5 MG tablet Take 1 tablet (2.5 mg total) by mouth daily.  [name] linagliptin (TRADJENTA) 5 MG TABS tablet Take 5 mg by mouth daily.  [name] lisinopril (PRINIVIL,ZESTRIL) 20 MG tablet Take 20 mg by mouth daily.  . metFORMIN (GLUCOPHAGE) 1000 MG tablet Take 1,000 mg by mouth 2 (two) times daily with a meal.  . potassium chloride (MICRO-K) 10 MEQ CR capsule potassium chloride ER 10 mEq capsule,extended release  . pravastatin (PRAVACHOL) 40 MG tablet Take  40 mg by mouth daily. In afternoon    Past Medical History:  Diagnosis Date  . Cancer [facility])    breast  . Carcinoma of right breast ([place]) 05/20/2014  . Diabetes mellitus without complication ([place])   . Hypertension   . Lymphedema   . Personal history of radiation therapy 2015   Right breast  . PONV (postoperative nausea and vomiting)   . Psoriasis 1990  . Stroke ([place]) 2000   residual left arm weakness  . Varicose veins     Past Surgical History:  Procedure Laterality Date  . AMPUTATION TOE Right 01/25/2017   Procedure: AMPUTATION TOE   ;  Surgeon: Albertine Patricia, DPM;  Location: [facility];  Service: Podiatry;  Laterality: Right;  . BREAST EXCISIONAL BIOPSY Right 2015   positive, with radiation  . BREAST LUMPECTOMY Right 04-2014   followed by radiation,  no chemo  . BREAST REDUCTION SURGERY  1977  . CHOLECYSTECTOMY N/A 09/23/2015   Procedure: LAPAROSCOPIC CHOLECYSTECTOMY;  Surgeon: Bonner Puna, MD;  Location: [facility];  Service: General;  Laterality: N/A;  . DILATION AND CURETTAGE OF UTERUS  2010  . EYE SURGERY Left 2015   cataracts and left eye detached retina repair  . EYE SURGERY Right 2013   cataract with len implant  . GASTRIC ROUX-EN-Y N/A 09/23/2015   Procedure: LAPAROSCOPIC ROUX-EN-Y GASTRIC;  Surgeon: Bonner Puna, MD;  Location: [facility];  Service: General;  Laterality: N/A;  . HIATAL HERNIA REPAIR N/A 09/23/2015  Procedure: LAPAROSCOPIC REPAIR OF HIATAL HERNIA;  Surgeon: Bonner Puna, MD;  Location: [facility];  Service: General;  Laterality: N/A;  . IRRIGATION AND DEBRIDEMENT FOOT Right 01/25/2017   Procedure: IRRIGATION AND DEBRIDEMENT FOOT;  Surgeon: Albertine Patricia, DPM;  Location: [facility];  Service: Podiatry;  Laterality: Right;  . IRRIGATION AND DEBRIDEMENT FOOT Right 01/29/2017   Procedure: IRRIGATION AND DEBRIDEMENT FOOT and application of wound vac;  Surgeon: Albertine Patricia, DPM;  Location: [facility];  Service: Podiatry;  Laterality: Right;  . LOWER EXTREMITY  ANGIOGRAPHY Right 01/28/2017   Procedure: Lower Extremity Angiography;  Surgeon: Katha Cabal, MD;  Location: [place] [facility];  Service: Cardiovascular;  Laterality: Right;  . LOWER EXTREMITY INTERVENTION  01/28/2017   Procedure: Lower Extremity Intervention;  Surgeon: Katha Cabal, MD;  Location: [place] [facility];  Service: Cardiovascular;;  . REDUCTION MAMMAPLASTY      Social History Social History   Tobacco Use  . Smoking status: Never Smoker  . Smokeless tobacco: Never Used  Substance Use Topics  . Alcohol use: No    Alcohol/week: 0.0 standard drinks  . Drug use: No    Family History Family History  Problem Relation Age of Onset  . Diabetes Mother   . Cancer Mother   . Breast cancer Mother 34  . Diabetes Father   . Diabetes Sister   . Diabetes Brother     No Known Allergies   REVIEW OF SYSTEMS (Negative unless checked)  Constitutional: [] Weight loss  [] Fever  [] Chills Cardiac: [] Chest pain   [] Chest pressure   [] Palpitations   [] Shortness of breath when laying flat   [] Shortness of breath with exertion. Vascular:  [] Pain in legs with walking   [x] Pain in legs with dependency  [] History of DVT   [] Phlebitis   [x] Swelling in legs   [] Varicose veins   [] Non-healing ulcers Pulmonary:   [] Uses home oxygen   [] Productive cough   [] Hemoptysis   [] Wheeze  [] COPD   [] Asthma Neurologic:  [] Dizziness   [] Seizures   [] History of stroke   [] History of TIA  [] Aphasia   [] Vissual changes   [] Weakness or numbness in arm   [] Weakness or numbness in leg Musculoskeletal:   [] Joint swelling   [x] Joint pain   [] Low back pain Hematologic:  [] Easy bruising  [] Easy bleeding   [] Hypercoagulable state   [] Anemic Gastrointestinal:  [] Diarrhea   [] Vomiting  [] Gastroesophageal reflux/heartburn   [] Difficulty swallowing. Genitourinary:  [] Chronic kidney disease   [] Difficult urination  [] Frequent urination   [] Blood in urine Skin:  [] Rashes   [] Ulcers  Psychological:  [] History of  anxiety   []  History of major depression.  Physical Examination  Vitals:   10/19/18 1211  BP: (!) 163/74  Pulse: 65  Resp: 16  Weight: 198 lb (89.8 kg)  Height: 5\' 3"  (1.6 m)   Body mass index is 35.07 kg/m. Gen: WD/WN, NAD Head: [place]/AT, No temporalis wasting.  Ear/Nose/Throat: Hearing grossly intact, nares w/o erythema or drainage Eyes: PER, EOMI, sclera nonicteric.  Neck: Supple, no large masses.   Pulmonary:  Good air movement, no audible wheezing bilaterally, no use of accessory muscles.  Cardiac: RRR, no JVD Vascular: scattered varicosities present bilaterally.  Mild venous stasis changes to the legs bilaterally.  3+ soft pitting edema Vessel Right Left  Radial Palpable Palpable  PT Not Palpable Not Palpable  DP Trace Palpable Trace Palpable  Gastrointestinal: Non-distended. No guarding/no peritoneal signs.  Musculoskeletal: M/S 5/5 throughout.  No deformity or  atrophy.  Neurologic: CN 2-12 intact. Symmetrical.  Speech is fluent. Motor exam as listed above. Psychiatric: Judgment intact, Mood & affect appropriate for pt's clinical situation. Dermatologic: No rashes or ulcers noted.  No changes consistent with cellulitis. Lymph : No lichenification or skin changes of chronic lymphedema.  CBC Lab Results  Component Value Date   WBC 10.8 05/11/2018   HGB 10.9 (L) 05/11/2018   HCT 32.8 (L) 05/11/2018   MCV 86.1 05/11/2018   PLT 145 (L) 05/11/2018    BMET    Component Value Date/Time   NA 136 05/09/2018 2304   NA 139 11/18/2014 1011   K 4.1 05/09/2018 2304   K 3.7 11/18/2014 1011   CL 103 05/09/2018 2304   CL 100 11/18/2014 1011   CO2 22 05/09/2018 2304   CO2 35 (H) 11/18/2014 1011   GLUCOSE 229 (H) 05/09/2018 2304   GLUCOSE 70 11/18/2014 1011   BUN 27 (H) 05/09/2018 2304   BUN 22 (H) 11/18/2014 1011   CREATININE 1.27 (H) 05/09/2018 2304   CREATININE 0.88 11/18/2014 1011   CALCIUM 8.6 (L) 05/09/2018 2304   CALCIUM 9.0 11/18/2014 1011   GFRNONAA 43 (L)  05/09/2018 2304   GFRNONAA >60 11/18/2014 1011   GFRNONAA >60 01/04/2014 1825   GFRAA 50 (L) 05/09/2018 2304   GFRAA >60 11/18/2014 1011   GFRAA >60 01/04/2014 1825   CrCl cannot be calculated (Patient's most recent lab result is older than the maximum 21 days allowed.).  COAG No results found for: INR, PROTIME  Radiology No results found.   Assessment/Plan 1. Chronic venous insufficiency No surgery or intervention at this point in time.    I have had a long discussion with the patient regarding venous insufficiency and why it  causes symptoms. I have discussed with the patient the chronic skin changes that accompany venous insufficiency and the long term sequela such as infection and ulceration.  Patient will begin wearing graduated compression stockings class 1 (20-30 mmHg) or compression wraps on a daily basis a prescription was given. The patient will put the stockings on first thing in the morning and removing them in the evening. The patient is instructed specifically not to sleep in the stockings.    In addition, behavioral modification including several periods of elevation of the lower extremities during the day will be continued. I have demonstrated that proper elevation is a position with the ankles at heart level.  The patient is instructed to begin routine exercise, especially walking on a daily basis  Venous duplex shows mild deep venous reflux bilaterally, no superficial reflux  Following the review of the ultrasound the patient will follow up in 6 months to reassess the degree of swelling and the control that graduated compression stockings or compression wraps  is offering.   The patient can be assessed for a Lymph Pump at that time  2. Hypertension, unspecified type Continue antihypertensive medications as already ordered, these medications have been reviewed and there are no changes at this time.   3. Peripheral vascular disease ([place]) Recommend:  I do not find  evidence of life style limiting vascular disease. The patient specifically denies life style limitation.  Previous noninvasive studies including ABI's of the legs do not identify critical vascular problems.  The patient should continue walking and begin a more formal exercise program. The patient should continue his antiplatelet therapy and aggressive treatment of the lipid abnormalities.  The patient should begin wearing graduated compression socks 15-20 mmHg strength to  control her mild edema.  Patient will follow-up with me regarding her venous disease and the possible need for a lymph pump   4. Type 2 diabetes mellitus with diabetic polyneuropathy, with long-term current use of insulin ([facility]) Continue hypoglycemic medications as already ordered, these medications have been reviewed and there are no changes at this time.  Hgb A1C to be monitored as already arranged by primary service   5. Mixed hyperlipidemia Continue statin as ordered and reviewed, no changes at this time     Hortencia Pilar, MD  10/19/2018 12:52 PM

## 2018-12-03 NOTE — Progress Notes (Signed)
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  INTERVAL HISTORY: Patient returns to clinic today for routine six-month follow-up.  She currently feels well and is asymptomatic.  She is tolerating letrozole without significant side effects.  On self breast examination she noted a "knot" in her left breast.  She has no neurologic complaints. She denies any recent fevers or illnesses. She has a good appetite and denies weight loss. She denies any pain. She denies any chest pain or shortness of breath. She has no nausea, vomiting, constipation, or diarrhea. She has no urinary complaints.  Patient offers no further specific complaints today.  REVIEW OF SYSTEMS:   Review of Systems  Constitutional: Negative.  Negative for fever, malaise/fatigue and weight loss.  Respiratory: Negative.  Negative for cough and shortness of breath.   Cardiovascular: Negative.  Negative for chest pain and leg swelling.  Gastrointestinal: Negative.  Negative for abdominal pain and constipation.  Genitourinary: Negative.  Negative for dysuria.  Musculoskeletal: Negative.  Negative for myalgias.  Skin: Negative.  Negative for rash.  Neurological: Negative.  Negative for sensory change, focal weakness, weakness and headaches.  Psychiatric/Behavioral: Negative.  The patient is not nervous/anxious.     As per HPI. Otherwise, a complete review of systems is negative.   PAST MEDICAL HISTORY: Past Medical History:  Diagnosis Date  . Cancer [facility])    breast  . Carcinoma of right breast ([place]) 05/20/2014  . Diabetes mellitus without complication ([place])   . Hypertension    . Lymphedema   . Personal history of radiation therapy 2015   Right breast  . PONV (postoperative nausea and vomiting)   . Psoriasis 1990  . Stroke ([place]) 2000   residual left arm weakness  . Varicose veins     PAST SURGICAL HISTORY: Past Surgical History:  Procedure Laterality Date  . AMPUTATION TOE Right 01/25/2017   Procedure: AMPUTATION TOE   ;  Surgeon: Albertine Patricia, DPM;  Location: [facility] ORS;  Service: Podiatry;  Laterality: Right;  . BREAST EXCISIONAL BIOPSY Right 2015   positive, with radiation  . BREAST LUMPECTOMY Right 04-2014   followed by radiation,  no chemo  . BREAST REDUCTION SURGERY  1977  . CHOLECYSTECTOMY N/A 09/23/2015   Procedure: LAPAROSCOPIC CHOLECYSTECTOMY;  Surgeon: Bonner Puna, MD;  Location: [facility] ORS;  Service: General;  Laterality: N/A;  . DILATION AND CURETTAGE OF UTERUS  2010  . EYE SURGERY Left 2015   cataracts and left eye detached retina repair  . EYE SURGERY Right 2013   cataract with len implant  . GASTRIC ROUX-EN-Y N/A 09/23/2015   Procedure: LAPAROSCOPIC ROUX-EN-Y GASTRIC;  Surgeon: Bonner Puna, MD;  Location: [facility] ORS;  Service: General;  Laterality: N/A;  . HIATAL HERNIA REPAIR N/A 09/23/2015   Procedure: LAPAROSCOPIC REPAIR OF HIATAL HERNIA;  Surgeon: Bonner Puna, MD;  Location: [facility] OR[facility];  Service: General;  Laterality: N/A;  . IRRIGATION AND DEBRIDEMENT FOOT Right 01/25/2017   Procedure: IRRIGATION AND DEBRIDEMENT FOOT;  Surgeon: Albertine Patricia, DPM;  Location: [facility] OR[facility];  Service: Podiatry;  Laterality: Right;  . IRRIGATION AND DEBRIDEMENT FOOT Right 01/29/2017   Procedure: IRRIGATION AND DEBRIDEMENT  FOOT and application of wound vac;  Surgeon: Albertine Patricia, DPM;  Location: [facility] OR[facility];  Service: Podiatry;  Laterality: Right;  . LOWER EXTREMITY ANGIOGRAPHY Right 01/28/2017   Procedure: Lower Extremity Angiography;  Surgeon: Katha Cabal, MD;  Location: [place] [facility];  Service: Cardiovascular;  Laterality: Right;  . LOWER  EXTREMITY INTERVENTION  01/28/2017   Procedure: Lower Extremity Intervention;  Surgeon: Katha Cabal, MD;  Location: [place] [facility];  Service: Cardiovascular;;  . REDUCTION MAMMAPLASTY      FAMILY HISTORY Family History  Problem Relation Age of Onset  . Diabetes Mother   . Cancer Mother   . Breast cancer Mother 10  . Diabetes Father   . Diabetes Sister   . Diabetes Brother     GYNECOLOGIC HISTORY:  No LMP recorded. Patient is postmenopausal.     ADVANCED DIRECTIVES:    HEALTH MAINTENANCE: Social History   Tobacco Use  . Smoking status: Never Smoker  . Smokeless tobacco: Never Used  Substance Use Topics  . Alcohol use: No    Alcohol/week: 0.0 standard drinks  . Drug use: No    No Known Allergies  Current Outpatient Medications  Medication Sig Dispense Refill  . albuterol (PROVENTIL HFA;VENTOLIN HFA) 108 (90 BASE) MCG/ACT inhaler Inhale 2 puffs into the lungs every 6 (six) hours as needed for wheezing or shortness of breath.    . calcium-vitamin D (OSCAL WITH D) 500-200 MG-UNIT tablet Take 2 tablets by mouth daily with breakfast. 180 tablet 3  . clopidogrel (PLAVIX) 75 MG tablet Take 75 mg by mouth.    . erythromycin ophthalmic ointment erythromycin 5 mg/gram (0.5 %) eye ointment  APPLY 1 CM RIBBON INTO THE LOWER CONJUNCTIVAL SAC in the LEFT EYE BY OPHTHALMIC ROUTE 2-3 TIMES PER DAY PRN    . fluticasone (FLONASE) 50 MCG/ACT nasal spray fluticasone propionate 50 mcg/actuation nasal spray,suspension    . furosemide (LASIX) 20 MG tablet Take 20 mg by mouth daily.    [name] gabapentin (NEURONTIN) 300 MG capsule Take 300 mg by mouth 3 (three) times daily.     . insulin NPH-regular Human (NOVOLIN 70/30) (70-30) 100 UNIT/ML injection Novolin 70/30 U-100 Insulin    . letrozole (FEMARA) 2.5 MG tablet Take 1 tablet (2.5 mg total) by mouth daily. 90 tablet 3  . linagliptin (TRADJENTA) 5 MG TABS tablet Take 5 mg by mouth daily.    [name] lisinopril (PRINIVIL,ZESTRIL) 20 MG tablet  Take 20 mg by mouth daily.    . metFORMIN (GLUCOPHAGE) 1000 MG tablet Take 1,000 mg by mouth 2 (two) times daily with a meal.    . nystatin (MYCOSTATIN/NYSTOP) powder Apply topically 4 (four) times daily. 15 g 0  . PARoxetine (PAXIL) 40 MG tablet Take 40 mg by mouth daily.    . potassium chloride (MICRO-K) 10 MEQ CR capsule potassium chloride ER 10 mEq capsule,extended release    . pravastatin (PRAVACHOL) 40 MG tablet Take 40 mg by mouth daily. In afternoon    . TRUE METRIX BLOOD GLUCOSE TEST test strip     . TRUEPLUS LANCETS 33G MISC      No current facility-administered medications for this visit.     OBJECTIVE: There were no vitals taken for this visit.   There is no height or weight on file to calculate BMI.    ECOG FS:0 - Asymptomatic  General: Well-developed, well-nourished, no acute distress. Eyes: Pink conjunctiva, anicteric sclera. HEENT: Normocephalic, moist mucous membranes. Breast: Bilateral breast and axilla without lumps  or masses. Lungs: Clear to auscultation bilaterally. Heart: Regular rate and rhythm. No rubs, murmurs, or gallops. Abdomen: Soft, nontender, nondistended. No organomegaly noted, normoactive bowel sounds. Musculoskeletal: No edema, cyanosis, or clubbing. Neuro: Alert, answering all questions appropriately. Cranial nerves grossly intact. Skin: No rashes or petechiae noted. Psych: Normal affect.  LAB RESULTS:  No visits with results within 3 Day(s) from this visit.  Latest known visit with results is:  Admission on 05/10/2018, Discharged on 05/11/2018  Component Date Value Ref Range Status  . Sodium 05/09/2018 136  135 - 145 mmol/L Final  . Potassium 05/09/2018 4.1  3.5 - 5.1 mmol/L Final   HEMOLYSIS AT THIS LEVEL MAY AFFECT RESULT  . Chloride 05/09/2018 103  98 - 111 mmol/L Final   Please note change in reference range.  . CO2 05/09/2018 22  22 - 32 mmol/L Final  . Glucose, Bld 05/09/2018 229* 70 - 99 mg/dL Final   Please note change in reference  range.  . BUN 05/09/2018 27* 8 - 23 mg/dL Final   Please note change in reference range.  . Creatinine, Ser 05/09/2018 1.27* 0.44 - 1.00 mg/dL Final  . Calcium 05/09/2018 8.6* 8.9 - 10.3 mg/dL Final  . GFR calc non Af Amer 05/09/2018 43* >60 mL/min Final  . GFR calc Af Amer 05/09/2018 50* >60 mL/min Final   Comment: (NOTE) The eGFR has been calculated using the CKD EPI equation. This calculation has not been validated in all clinical situations. eGFR's persistently <60 mL/min signify possible Chronic Kidney Disease.   Georgiann Hahn gap 05/09/2018 11  5 - 15 Final   Performed at [facility], [address] [place], [place] [address]  . WBC 05/09/2018 17.8* 3.6 - 11.0 K/uL Final  . RBC 05/09/2018 4.08  3.80 - 5.20 MIL/uL Final  . Hemoglobin 05/09/2018 11.6* 12.0 - 16.0 g/dL Final  . HCT 05/09/2018 35.0  35.0 - 47.0 % Final  . MCV 05/09/2018 85.6  80.0 - 100.0 fL Final  . MCH 05/09/2018 28.5  26.0 - 34.0 pg Final  . MCHC 05/09/2018 33.3  32.0 - 36.0 g/dL Final  . RDW 05/09/2018 13.0  11.5 - 14.5 % Final  . Platelets 05/09/2018 192  150 - 440 K/uL Final   Performed at [facility], [address] [place], [place] [address]  . Color, Urine 05/10/2018 AMBER* YELLOW Final   BIOCHEMICALS MAY BE AFFECTED BY COLOR  . APPearance 05/10/2018 CLOUDY* CLEAR Final  . Specific Gravity, Urine 05/10/2018 1.023  1.005 - 1.030 Final  . pH 05/10/2018 5.0  5.0 - 8.0 Final  . Glucose, UA 05/10/2018 NEGATIVE  NEGATIVE mg/dL Final  . Hgb urine dipstick 05/10/2018 LARGE* NEGATIVE Final  . Bilirubin Urine 05/10/2018 NEGATIVE  NEGATIVE Final  . Ketones, ur 05/10/2018 NEGATIVE  NEGATIVE mg/dL Final  . Protein, ur 05/10/2018 100* NEGATIVE mg/dL Final  . Nitrite 05/10/2018 POSITIVE* NEGATIVE Final  . Leukocytes, UA 05/10/2018 SMALL* NEGATIVE Final  . RBC / HPF 05/10/2018 11-20  0 - 5 RBC/hpf Final  . WBC, UA 05/10/2018 11-20  0 - 5 WBC/hpf Final  . Bacteria, UA 05/10/2018 MANY* NONE SEEN  Final  . Squamous Epithelial / LPF 05/10/2018 0-5  0 - 5 Final  . Mucus 05/10/2018 PRESENT   Final  . Amorphous Crystal 05/10/2018 PRESENT   Final   Performed at [facility], [address], [place], [place] [address]  . Troponin I 05/09/2018 0.03* <0.03 ng/mL Final   Comment: CRITICAL RESULT  CALLED TO, READ BACK BY AND VERIFIED WITH CHERIE ALLISON '@2356'$  05/09/18 [facility] Performed at [facility], [address] [place], [place] [address]   . Specimen Description 05/10/2018 BLOOD R AC   Final  . Special Requests 05/10/2018 BOTTLES DRAWN AEROBIC AND ANAEROBIC Blood Culture adequate volume   Final  . Culture 05/10/2018    Final                   Value:NO GROWTH 5 DAYS Performed at [facility], [address] [place], [place] [address]   . Report Status 05/10/2018 05/15/2018 FINAL   Final  . Specimen Description 05/10/2018 BLOOD LAC   Final  . Special Requests 05/10/2018 BOTTLES DRAWN AEROBIC AND ANAEROBIC Blood Culture adequate volume   Final  . Culture 05/10/2018    Final                   Value:NO GROWTH 5 DAYS Performed at [facility], [address], [place], [place] [address]   . Report Status 05/10/2018 05/15/2018 FINAL   Final  . Lactic Acid, Venous 05/10/2018 3.1* 0.5 - 1.9 mmol/L Final   Comment: CRITICAL RESULT CALLED TO, READ BACK BY AND VERIFIED WITH BUTCH WOODS '@0347'$  05/10/18 [facility] Performed at [facility], [address], [place], [place] [address]   . Lactic Acid, Venous 05/10/2018 2.4* 0.5 - 1.9 mmol/L Final   Comment: CRITICAL RESULT CALLED TO, READ BACK BY AND VERIFIED WITH JENNIFER INGERSOL '@0548'$  05/10/18 [facility] Performed at [facility], [address], [place], [place] [address]   . Specimen Description 05/10/2018    Final                   Value:URINE, RANDOM Performed at [facility], [address] [place], [place] [address]   . Special Requests 05/10/2018    Final                    Value:NONE Performed at [facility], [address] [place], [place] [address]   . Culture 05/10/2018 *  Final                   Value:<10,000 COLONIES/mL INSIGNIFICANT GROWTH Performed at [facility] [address] [place], [place] [address]   . Report Status 05/10/2018 05/11/2018 FINAL   Final  . TSH 05/10/2018 0.648  0.350 - 4.500 uIU/mL Final   Comment: Performed by a 3rd Generation assay with a functional sensitivity of <=0.01 uIU/mL. Performed at [facility], [address], [place], [place] [address]   . Hgb A1c MFr Bld 05/10/2018 6.5* 4.8 - 5.6 % Final   Comment: (NOTE) Pre diabetes:          5.7%-6.4% Diabetes:              >6.4% Glycemic control for   <7.0% adults with diabetes   . Mean Plasma Glucose 05/10/2018 139.85  mg/dL Final   Performed at [facility] [address], [place], [place] [address]  . Glucose-Capillary 05/10/2018 203* 70 - 99 mg/dL Final  . Comment 1 05/10/2018 Notify RN   Final  . Comment 2 05/10/2018 Document in Chart   Final  . Lactic Acid, Venous 05/10/2018 1.3  0.5 - 1.9 mmol/L Final   Performed at [facility], [address] [place],  [address]  . Glucose-Capillary 05/10/2018 142* 70 - 99 mg/dL Final  . Comment  1 05/10/2018 Notify RN   Final  . Comment 2 05/10/2018 Document in Chart   Final  . Specimen Description 05/10/2018    Final                   Value:NOSE Performed at [facility], [address], [place], [place] [address]   . Special Requests 05/10/2018    Final                   Value:NONE Performed at [facility], [address] [place], [place] [address]   . Culture 05/10/2018    Final                   Value:NO MRSA DETECTED Performed at [facility], [place] [address], [place], [place] [address]   . Report Status 05/10/2018 05/12/2018 FINAL   Final  . Glucose-Capillary 05/10/2018 158* 70 - 99 mg/dL Final  . Comment 1 05/10/2018 Notify RN    Final  . Comment 2 05/10/2018 Document in Chart   Final  . Glucose-Capillary 05/10/2018 126* 70 - 99 mg/dL Final  . Comment 1 05/10/2018 Notify RN   Final  . Comment 2 05/10/2018 Document in Chart   Final  . Glucose-Capillary 05/11/2018 116* 70 - 99 mg/dL Final  . WBC 05/11/2018 10.8  3.6 - 11.0 K/uL Final  . RBC 05/11/2018 3.81  3.80 - 5.20 MIL/uL Final  . Hemoglobin 05/11/2018 10.9* 12.0 - 16.0 g/dL Final  . HCT 05/11/2018 32.8* 35.0 - 47.0 % Final  . MCV 05/11/2018 86.1  80.0 - 100.0 fL Final  . MCH 05/11/2018 28.5  26.0 - 34.0 pg Final  . MCHC 05/11/2018 33.1  32.0 - 36.0 g/dL Final  . RDW 05/11/2018 13.3  11.5 - 14.5 % Final  . Platelets 05/11/2018 145* 150 - 440 K/uL Final   Performed at [facility], [address], [place],  [address]  . Glucose-Capillary 05/11/2018 132* 70 - 99 mg/dL Final    STUDIES: No results found.  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  PLAN:    1. 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 Continue letrozole for total 5 years completing in July 2020.  Patient's most recent mammogram on May 29, 2018 was reported as BI-RADS 2.  Repeat in July 2020.  Return to clinic 6 months for routine evaluation at which time patient can likely be transitioned to yearly visits. 2.  Left breast "knot": No obvious mass was palpated on exam today.  Repeat mammogram as above. 3.  Postmenopausal: Patient's most recent bone mineral density on May 26, 2017 reported T score of -0.1 which is normal and unchanged from one year prior.  Repeat in July 2020.  I spent a total of 20 minutes face-to-face with the patient of which greater than 50% of the visit was spent in counseling and coordination of care as detailed  above.  Patient expressed understanding and was in agreement with this plan. She also understands that She can call clinic at any time with any questions, concerns, or complaints.    Lloyd Huger, MD   12/06/2018 5:04 PM

## 2018-12-05 ENCOUNTER — Inpatient Hospital Stay: Payer: Medicare HMO | Attending: Oncology | Admitting: Oncology

## 2018-12-05 ENCOUNTER — Other Ambulatory Visit: Payer: Self-pay

## 2018-12-05 DIAGNOSIS — C50511 Malignant neoplasm of lower-outer quadrant of right female breast: Secondary | ICD-10-CM | POA: Insufficient documentation

## 2018-12-05 DIAGNOSIS — Z803 Family history of malignant neoplasm of breast: Secondary | ICD-10-CM | POA: Insufficient documentation

## 2018-12-05 DIAGNOSIS — Z794 Long term (current) use of insulin: Secondary | ICD-10-CM | POA: Insufficient documentation

## 2018-12-05 DIAGNOSIS — Z79811 Long term (current) use of aromatase inhibitors: Secondary | ICD-10-CM

## 2018-12-05 DIAGNOSIS — Z170 Estrogen receptor positive status [ER+]: Secondary | ICD-10-CM | POA: Insufficient documentation

## 2018-12-05 DIAGNOSIS — Z79899 Other long term (current) drug therapy: Secondary | ICD-10-CM | POA: Insufficient documentation

## 2018-12-05 DIAGNOSIS — Z809 Family history of malignant neoplasm, unspecified: Secondary | ICD-10-CM | POA: Diagnosis not present

## 2018-12-05 DIAGNOSIS — I10 Essential (primary) hypertension: Secondary | ICD-10-CM | POA: Insufficient documentation

## 2018-12-05 DIAGNOSIS — Z923 Personal history of irradiation: Secondary | ICD-10-CM | POA: Insufficient documentation

## 2018-12-05 DIAGNOSIS — E119 Type 2 diabetes mellitus without complications: Secondary | ICD-10-CM

## 2018-12-05 NOTE — Progress Notes (Signed)
Patient is here today to follow up on her primary cancer of lower-outer quadrant of right breast. Patient stated that se found a knot on her left breast about a week and a half ago. Patient stated that she is not able to find it now. Patient denied being sick around that time, trauma to the area, nipple discharge or skin discoloration. Patient's last mammogram was done on 05/29/2018 and it was benign.

## 2019-04-23 ENCOUNTER — Ambulatory Visit (INDEPENDENT_AMBULATORY_CARE_PROVIDER_SITE_OTHER): Payer: Medicare HMO | Admitting: Vascular Surgery

## 2019-05-31 ENCOUNTER — Ambulatory Visit
Admission: RE | Admit: 2019-05-31 | Discharge: 2019-05-31 | Disposition: A | Discharge status: Home / Self Care | Payer: Medicare HMO | Source: Ambulatory Visit | Attending: Oncology | Admitting: Oncology

## 2019-05-31 DIAGNOSIS — C50511 Malignant neoplasm of lower-outer quadrant of right female breast: Secondary | ICD-10-CM

## 2019-05-31 DIAGNOSIS — M85851 Other specified disorders of bone density and structure, right thigh: Secondary | ICD-10-CM | POA: Diagnosis not present

## 2019-05-31 IMAGING — MG DIGITAL DIAGNOSTIC BILATERAL MAMMOGRAM WITH TOMO AND CAD
6 of 9 series · 6 of 25 positions shown · non-contrast
Comparison: Previous exam(s).

CLINICAL DATA: History of RIGHT breast cancer in [YN] status post
breast conservation surgery and radiation therapy.

EXAM:
DIGITAL DIAGNOSTIC BILATERAL MAMMOGRAM WITH CAD AND TOMO

[R MLO]
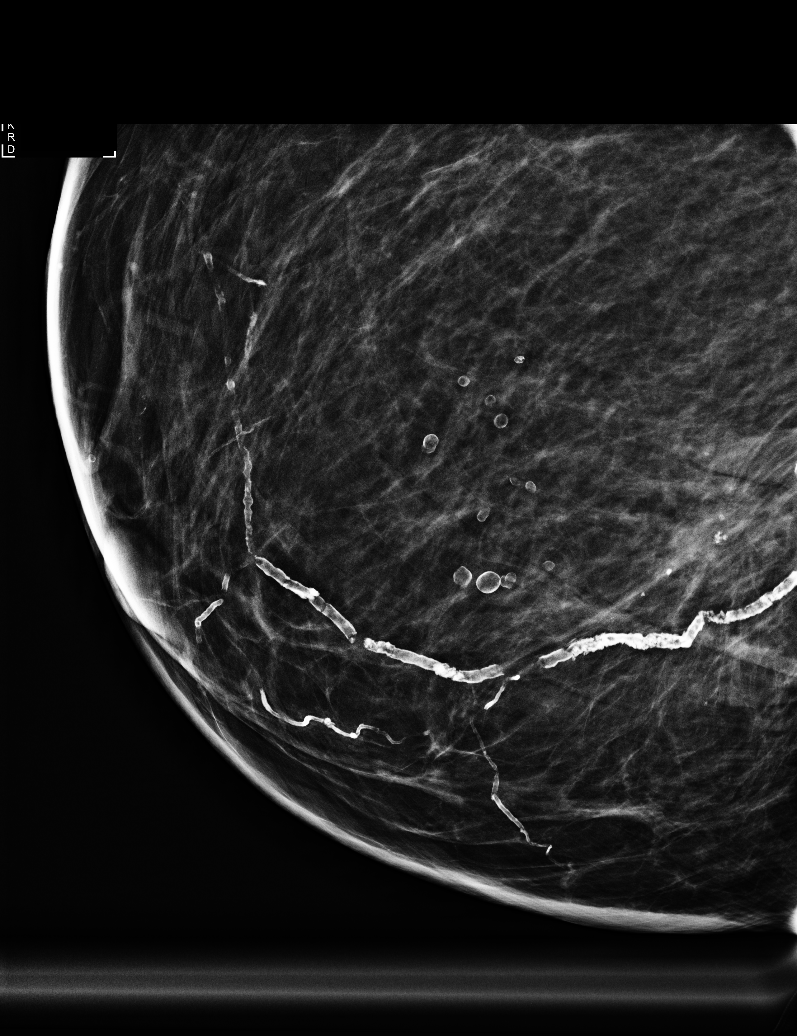

[L MLO synth-2D]
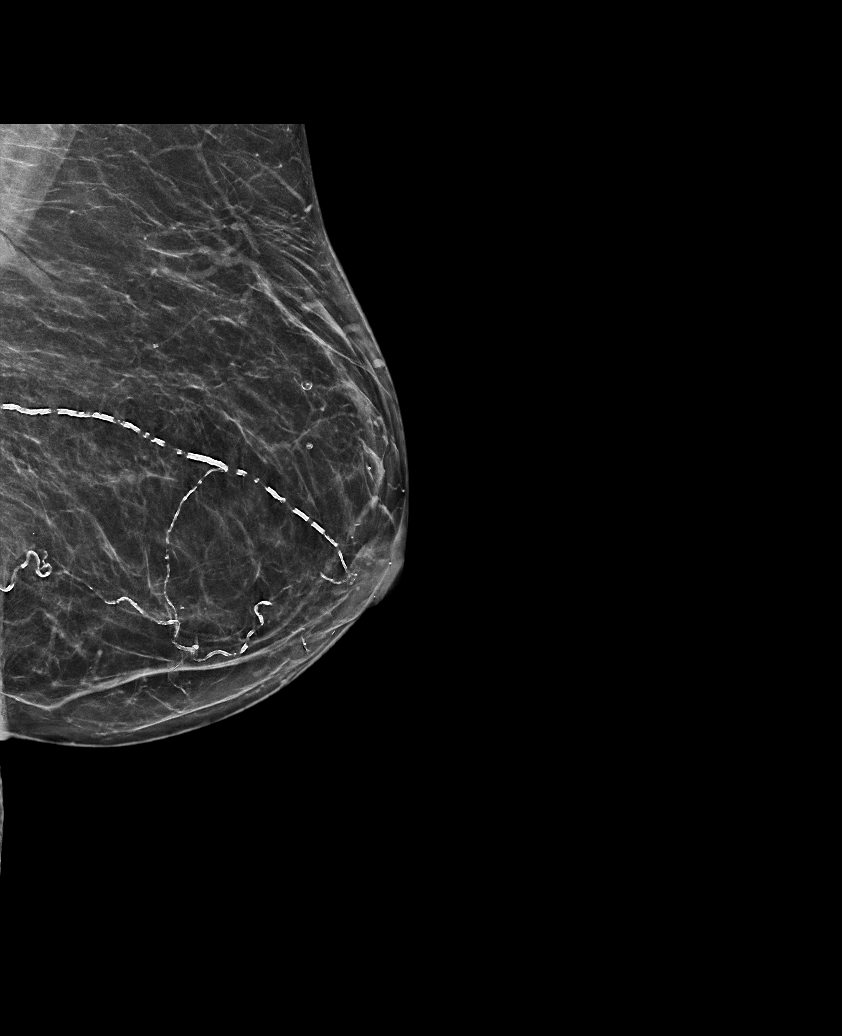

[R CC synth-2D]
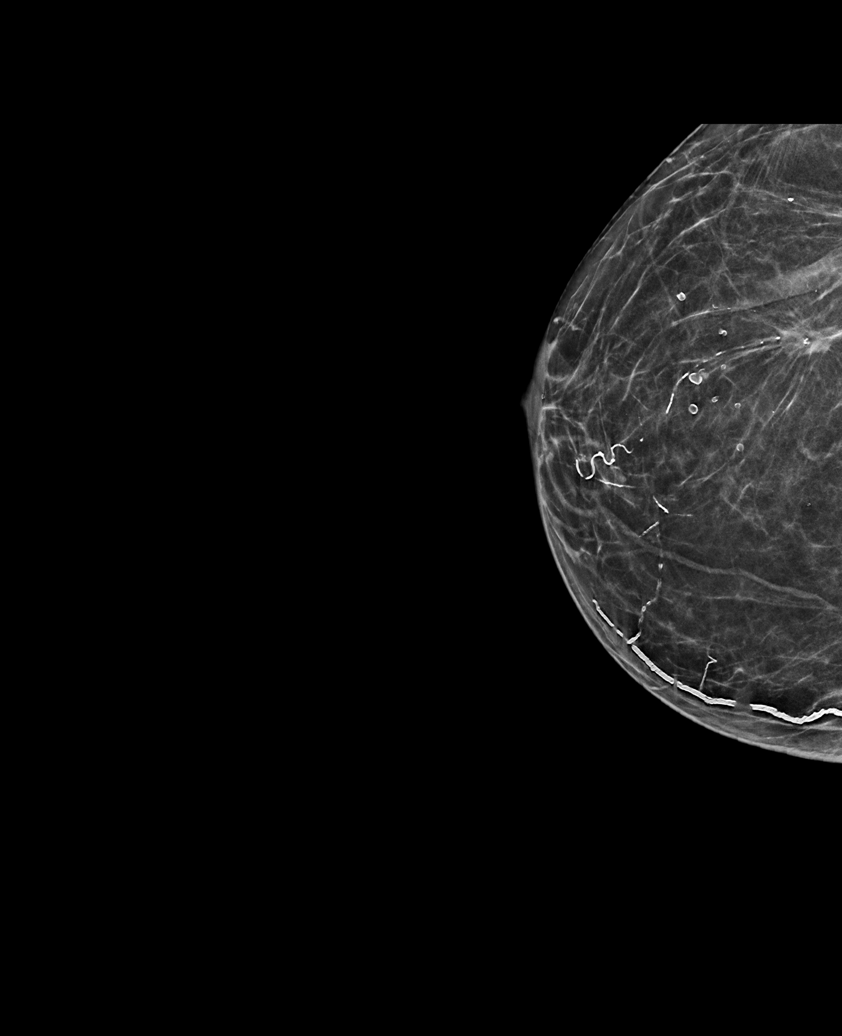

[R MLO synth-2D]
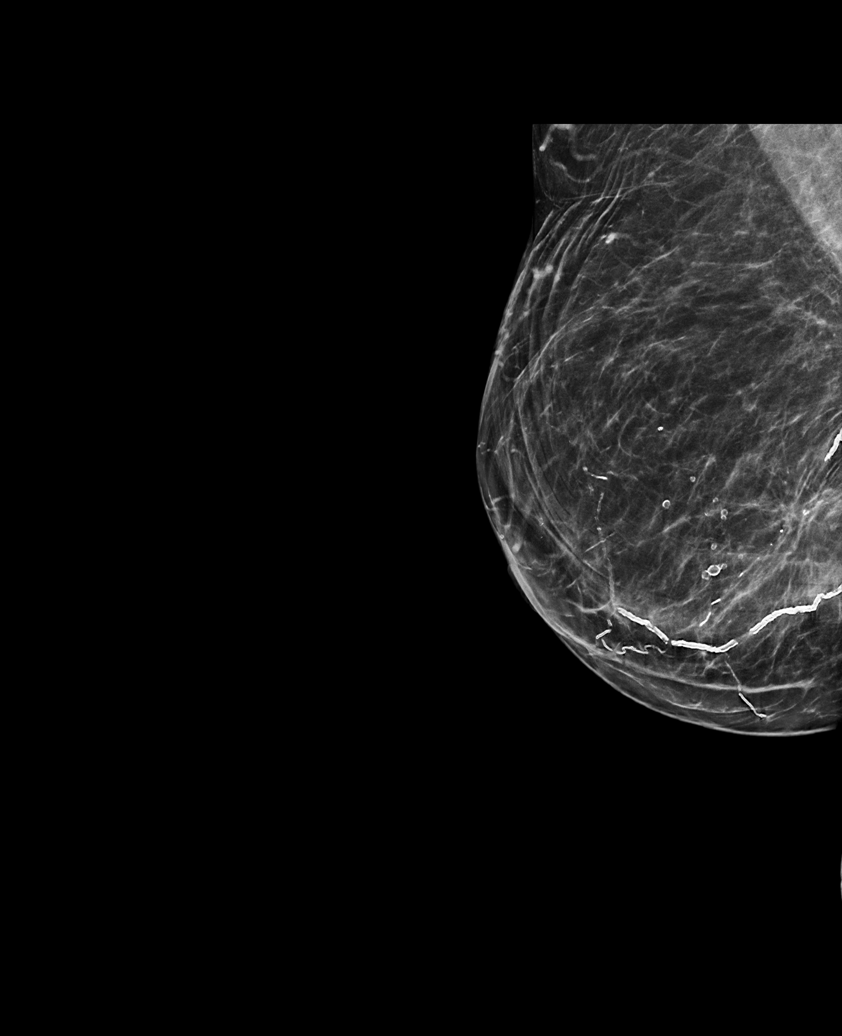

[L CC synth-2D]
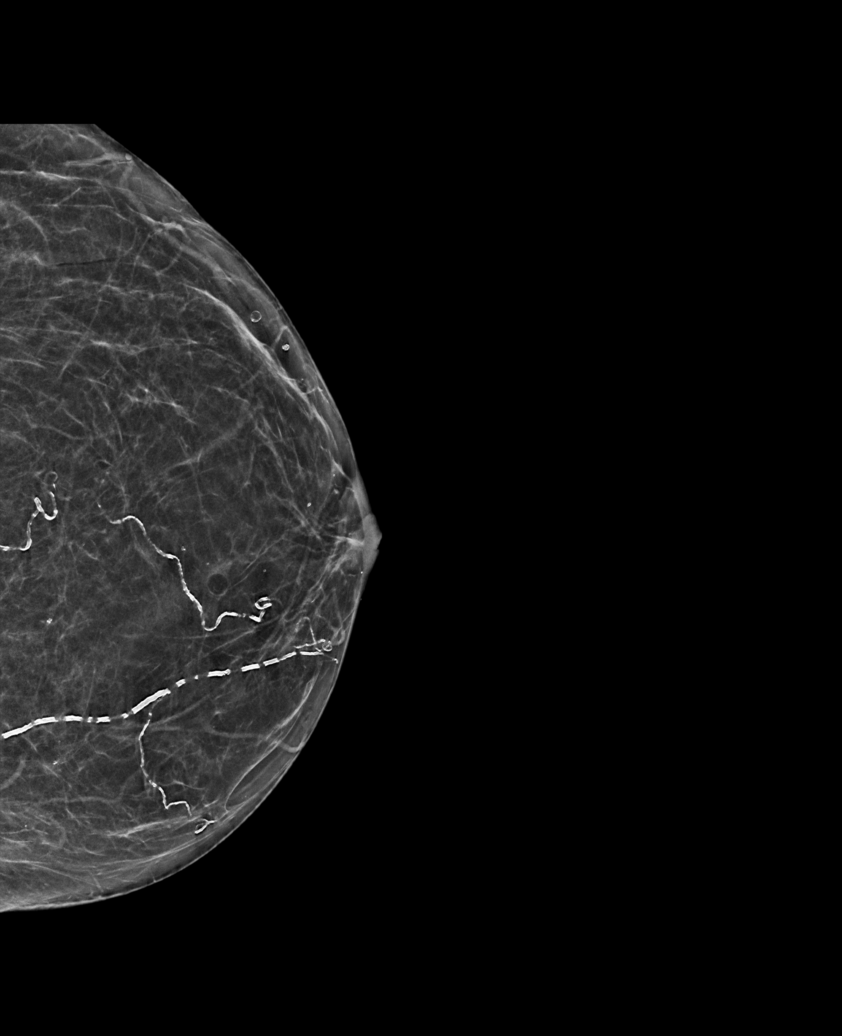

[R MLO tomo · tomo slice 37/73.0]
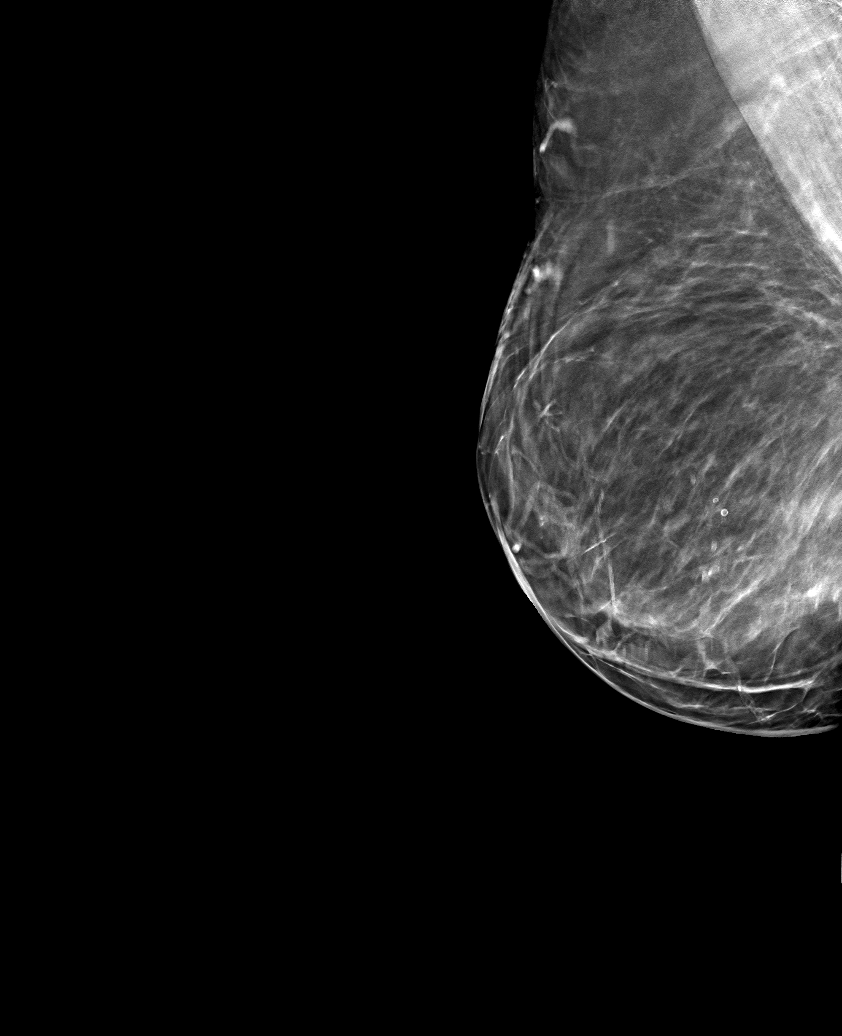

[6 of 25 positions shown; findings below may reference images not displayed]

ACR Breast Density Category b: There are scattered areas of
fibroglandular density.
FINDINGS: There are stable postsurgical changes within the RIGHT breast. There
are no new dominant masses, suspicious calcifications or secondary
signs of malignancy within either breast.

Mammographic images were processed with CAD.
IMPRESSION: No evidence of malignancy within either breast. Stable postsurgical
changes within the RIGHT breast.

Per protocol, as patient has completed a 5 year course of post
surgical diagnostic mammography, patient can now return to a routine
annual bilateral screening mammogram schedule.

RECOMMENDATION:
Screening mammogram in one year.(Code:[YN])

I have discussed the findings and recommendations with the patient.
Results were also provided in writing at the conclusion of the
visit. If applicable, a reminder letter will be sent to the patient
regarding the next appointment.

BI-RADS CATEGORY  2: Benign.

## 2019-06-16 NOTE — Progress Notes (Signed)
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  INTERVAL HISTORY: Patient returns to clinic today for routine 83-monthevaluation.  She currently feels well and is asymptomatic. She has no neurologic complaints. She denies any recent fevers or illnesses. She has a good appetite and denies weight loss.  She denies any chest pain, shortness of breath, cough, or hemoptysis.  She has no nausea, vomiting, constipation, or diarrhea. She has no urinary complaints.  Patient feels at her baseline offers no specific complaints today.  REVIEW OF SYSTEMS:   Review of Systems  Constitutional: Negative.  Negative for fever, malaise/fatigue and weight loss.  Respiratory: Negative.  Negative for cough and shortness of breath.   Cardiovascular: Negative.  Negative for chest pain and leg swelling.  Gastrointestinal: Negative.  Negative for abdominal pain and constipation.  Genitourinary: Negative.  Negative for dysuria.  Musculoskeletal: Negative.  Negative for myalgias.  Skin: Negative.  Negative for rash.  Neurological: Negative.  Negative for sensory change, focal weakness, weakness and headaches.  Psychiatric/Behavioral: Negative.  The patient is not nervous/anxious.     As per HPI. Otherwise, a complete review of systems is negative.   PAST MEDICAL HISTORY: Past Medical History:  Diagnosis Date  . Cancer ([facility]    breast  . [place] ([place] 05/[place]  . Diabetes mellitus without complication ([facility]   . Hypertension   . Lymphedema   . Personal history of radiation therapy 2015   Right breast  . PONV (postoperative nausea and  vomiting)   . Psoriasis 1990  . Stroke ([facility] 2000   residual left arm weakness  . Varicose veins     PAST SURGICAL HISTORY: Past Surgical History:  Procedure Laterality Date  . AMPUTATION TOE Right 01/25/2017   Procedure: AMPUTATION TOE   ;  Surgeon: MAlbertine Patricia DPM;  Location: [facility] ORS;  Service: Podiatry;  Laterality: Right;  . [facility]  . BREAST LUMPECTOMY Right 04-2014   followed by radiation,  no chemo  . BREAST REDUCTION SURGERY  1977  . CHOLECYSTECTOMY N/A 09/23/2015   Procedure: LAPAROSCOPIC CHOLECYSTECTOMY;  Surgeon: JBonner Puna MD;  Location: [facility] ORS;  Service: General;  Laterality: N/A;  . DILATION AND CURETTAGE OF UTERUS  2010  . EYE SURGERY Left 2015   cataracts and left eye detached retina repair  . EYE SURGERY Right 2013   cataract with len implant  . GASTRIC ROUX-EN-Y N/A 09/23/2015   Procedure: LAPAROSCOPIC ROUX-EN-Y GASTRIC;  Surgeon: JBonner Puna MD;  Location: [facility] ORS;  Service: General;  Laterality: N/A;  . HIATAL HERNIA REPAIR N/A 09/23/2015   Procedure: LAPAROSCOPIC REPAIR OF HIATAL HERNIA;  Surgeon: JBonner Puna MD;  Location: [facility] ORS;  Service: General;  Laterality: N/A;  . IRRIGATION AND DEBRIDEMENT FOOT Right 01/25/2017   Procedure: IRRIGATION AND DEBRIDEMENT FOOT;  Surgeon: MAlbertine Patricia DPM;  Location: [facility] OR[facility];  Service: Podiatry;  Laterality: Right;  . IRRIGATION AND DEBRIDEMENT FOOT Right 01/29/2017   Procedure: IRRIGATION AND DEBRIDEMENT FOOT and application of wound vac;  Surgeon: MAlbertine Patricia DPM;  Location: [facility] ORS;  Service: Podiatry;  Laterality: Right;  . LOWER EXTREMITY ANGIOGRAPHY Right 01/28/2017   Procedure: Lower Extremity Angiography;  Surgeon: Katha Cabal, MD;  Location: [place] [facility];  Service: Cardiovascular;  Laterality: Right;  . LOWER EXTREMITY INTERVENTION  01/28/2017   Procedure: Lower Extremity Intervention;  Surgeon: Katha Cabal, MD;  Location: [place] [facility];  Service: Cardiovascular;;  . REDUCTION MAMMAPLASTY      FAMILY HISTORY Family History  Problem Relation Age of Onset  . Diabetes Mother   . Cancer Mother   . Breast cancer Mother 72  . Diabetes Father   . Diabetes Sister   . Diabetes Brother     GYNECOLOGIC HISTORY:  No LMP recorded. Patient is postmenopausal.     ADVANCED DIRECTIVES:    HEALTH MAINTENANCE: Social History   Tobacco Use  . Smoking status: Never Smoker  . Smokeless tobacco: Never Used  Substance Use Topics  . Alcohol use: No    Alcohol/week: 0.0 standard drinks  . Drug use: No    No Known Allergies  Current Outpatient Medications  Medication Sig Dispense Refill  . albuterol (PROVENTIL HFA;VENTOLIN HFA) 108 (90 BASE) MCG/ACT inhaler Inhale 2 puffs into the lungs every 6 (six) hours as needed for wheezing or shortness of breath.    . calcium-vitamin D (OSCAL WITH D) 500-200 MG-UNIT tablet Take 2 tablets by mouth daily with breakfast. 180 tablet 3  . clopidogrel (PLAVIX) 75 MG tablet Take 75 mg by mouth.    . gabapentin (NEURONTIN) 300 MG capsule Take 300 mg by mouth 3 (three) times daily.     [name] letrozole (FEMARA) 2.5 MG tablet Take 1 tablet (2.5 mg total) by mouth daily. 90 tablet 3  . linagliptin (TRADJENTA) 5 MG TABS tablet Take 5 mg by mouth daily.    [name] lisinopril (PRINIVIL,ZESTRIL) 20 MG tablet Take 20 mg by mouth daily.    . metFORMIN (GLUCOPHAGE) 1000 MG tablet Take 1,000 mg by mouth 2 (two) times daily with a meal.    . PARoxetine (PAXIL) 40 MG tablet Take 40 mg by mouth daily.    . potassium chloride (MICRO-K) 10 MEQ CR capsule potassium chloride ER 10 mEq capsule,extended release    . pravastatin (PRAVACHOL) 40 MG tablet Take 40 mg by mouth daily. In afternoon    . TRUE METRIX BLOOD GLUCOSE TEST test strip     . TRUEPLUS LANCETS 33G MISC      No current facility-administered medications for this visit.     OBJECTIVE: BP (!) 148/84   Pulse (!) 57   Resp 18    There is no  height or weight on file to calculate BMI.    ECOG FS:0 - Asymptomatic  General: Well-developed, well-nourished, no acute distress. Eyes: Pink conjunctiva, anicteric sclera. HEENT: Normocephalic, moist mucous membranes. Breast: Bilateral breast and axilla without lumps or masses. Lungs: Clear to auscultation bilaterally. Heart: Regular rate and rhythm. No rubs, murmurs, or gallops. Abdomen: Soft, nontender, nondistended. No organomegaly noted, normoactive bowel sounds. Musculoskeletal: No edema, cyanosis, or clubbing. Neuro: Alert, answering all questions appropriately. Cranial nerves grossly intact. Skin: No rashes or petechiae noted. Psych: Normal affect.  LAB RESULTS:  No visits with results within 3 Day(s) from this visit.  Latest known visit with results is:  Admission on 05/10/2018, Discharged on 05/11/2018  Component Date Value Ref Range Status  . Sodium 05/09/2018 136  135 - 145 mmol/L Final  . Potassium 05/09/2018 4.1  3.5 - 5.1 mmol/L Final   HEMOLYSIS  AT THIS LEVEL MAY AFFECT RESULT  . Chloride 05/09/2018 103  98 - 111 mmol/L Final   Please note change in reference range.  . CO2 05/09/2018 22  22 - 32 mmol/L Final  . Glucose, Bld 05/09/2018 229* 70 - 99 mg/dL Final   Please note change in reference range.  . BUN 05/09/2018 27* 8 - 23 mg/dL Final   Please note change in reference range.  . Creatinine, Ser 05/09/2018 1.27* 0.44 - 1.00 mg/dL Final  . Calcium 05/09/2018 8.6* 8.9 - 10.3 mg/dL Final  . GFR calc non Af Amer 05/09/2018 43* >60 mL/min Final  . GFR calc Af Amer 05/09/2018 50* >60 mL/min Final   Comment: (NOTE) The eGFR has been calculated using the CKD EPI equation. This calculation has not been validated in all clinical situations. eGFR's persistently <60 mL/min signify possible Chronic Kidney Disease.   Georgiann Hahn gap 05/09/2018 11  5 - 15 Final   Performed at [facility], [address] [place], [place] [address]  . WBC 05/09/2018 17.8* 3.6 -  11.0 K/uL Final  . RBC 05/09/2018 4.08  3.80 - 5.20 MIL/uL Final  . Hemoglobin 05/09/2018 11.6* 12.0 - 16.0 g/dL Final  . HCT 05/09/2018 35.0  35.0 - 47.0 % Final  . MCV 05/09/2018 85.6  80.0 - 100.0 fL Final  . MCH 05/09/2018 28.5  26.0 - 34.0 pg Final  . MCHC 05/09/2018 33.3  32.0 - 36.0 g/dL Final  . RDW 05/09/2018 13.0  11.5 - 14.5 % Final  . Platelets 05/09/2018 192  150 - 440 K/uL Final   Performed at [facility], [address] [place], [place] [address]  . Color, Urine 05/10/2018 AMBER* YELLOW Final   BIOCHEMICALS MAY BE AFFECTED BY COLOR  . APPearance 05/10/2018 CLOUDY* CLEAR Final  . Specific Gravity, Urine 05/10/2018 1.023  1.005 - 1.030 Final  . pH 05/10/2018 5.0  5.0 - 8.0 Final  . Glucose, UA 05/10/2018 NEGATIVE  NEGATIVE mg/dL Final  . Hgb urine dipstick 05/10/2018 LARGE* NEGATIVE Final  . Bilirubin Urine 05/10/2018 NEGATIVE  NEGATIVE Final  . Ketones, ur 05/10/2018 NEGATIVE  NEGATIVE mg/dL Final  . Protein, ur 05/10/2018 100* NEGATIVE mg/dL Final  . Nitrite 05/10/2018 POSITIVE* NEGATIVE Final  . Leukocytes, UA 05/10/2018 SMALL* NEGATIVE Final  . RBC / HPF 05/10/2018 11-20  0 - 5 RBC/hpf Final  . WBC, UA 05/10/2018 11-20  0 - 5 WBC/hpf Final  . Bacteria, UA 05/10/2018 MANY* NONE SEEN Final  . Squamous Epithelial / LPF 05/10/2018 0-5  0 - 5 Final  . Mucus 05/10/2018 PRESENT   Final  . Amorphous Crystal 05/10/2018 PRESENT   Final   Performed at [facility], [address], [place], [place] [address]  . Troponin I 05/09/2018 0.03* <0.03 ng/mL Final   Comment: CRITICAL RESULT CALLED TO, READ BACK BY AND VERIFIED WITH CHERIE ALLISON '@2356'$  05/09/18 [facility] Performed at [facility], [address] [place], [place] [address]   . Specimen Description 05/10/2018 BLOOD R AC   Final  . Special Requests 05/10/2018 BOTTLES DRAWN AEROBIC AND ANAEROBIC Blood Culture adequate volume   Final  . Culture 05/10/2018    Final                   Value:NO  GROWTH 5 DAYS Performed at [facility], [address] [place], [place] [address]   . Report Status 05/10/2018 05/15/2018 FINAL   Final  . Specimen Description 05/10/2018  BLOOD LAC   Final  . Special Requests 05/10/2018 BOTTLES DRAWN AEROBIC AND ANAEROBIC Blood Culture adequate volume   Final  . Culture 05/10/2018    Final                   Value:NO GROWTH 5 DAYS Performed at [facility], [address] [place], [place] [address]   . Report Status 05/10/2018 05/15/2018 FINAL   Final  . Lactic Acid, Venous 05/10/2018 3.1* 0.5 - 1.9 mmol/L Final   Comment: CRITICAL RESULT CALLED TO, READ BACK BY AND VERIFIED WITH BUTCH WOODS _0  05/10/18 [facility] Performed at [facility], [address] [place], [place] [address]   . Lactic Acid, Venous 05/10/2018 2.4* 0.5 - 1.9 mmol/L Final   Comment: CRITICAL RESULT CALLED TO, READ BACK BY AND VERIFIED WITH JENNIFER INGERSOL _1  05/10/18 [facility] Performed at [facility], [address], [place], [place] [address]   . Specimen Description 05/10/2018    Final                   Value:URINE, RANDOM Performed at [facility], [address] [place], [place] [address]   . Special Requests 05/10/2018    Final                   Value:NONE Performed at [facility], [address] [place], [place] [address]   . Culture 05/10/2018 *  Final                   Value:<10,000 COLONIES/mL INSIGNIFICANT GROWTH Performed at [facility] [address] [place], [place] [address]   . Report Status 05/10/2018 05/11/2018 FINAL   Final  . TSH 05/10/2018 0.648  0.350 - 4.500 uIU/mL Final   Comment: Performed by a 3rd Generation assay with a functional sensitivity of <=0.01 uIU/mL. Performed at [facility], [address], [place], [place] [address]   . Hgb A1c MFr Bld 05/10/2018 6.5* 4.8 - 5.6 % Final   Comment: (NOTE) Pre diabetes:          5.7%-6.4% Diabetes:              >6.4%  Glycemic control for   <7.0% adults with diabetes   . Mean Plasma Glucose 05/10/2018 139.85  mg/dL Final   Performed at [facility] [address], [place], [place] [address]  . Glucose-Capillary 05/10/2018 203* 70 - 99 mg/dL Final  . Comment 1 05/10/2018 Notify RN   Final  . Comment 2 05/10/2018 Document in Chart   Final  . Lactic Acid, Venous 05/10/2018 1.3  0.5 - 1.9 mmol/L Final   Performed at [facility], [address] [place], [place] [address]  . Glucose-Capillary 05/10/2018 142* 70 - 99 mg/dL Final  . Comment 1 05/10/2018 Notify RN   Final  . Comment 2 05/10/2018 Document in Chart   Final  . Specimen Description 05/10/2018    Final                   Value:NOSE Performed at [facility], [address], [place], [place] [address]   . Special Requests 05/10/2018    Final                   Value:NONE Performed at [facility], [address] [place], [place] [address]   . Culture 05/10/2018    Final  Value:NO MRSA DETECTED Performed at [place] [address] [place], [place] [address]   . Report Status 05/10/2018 05/12/2018 FINAL   Final  . Glucose-Capillary 05/10/2018 158* 70 - 99 mg/dL Final  . Comment 1 05/10/2018 Notify RN   Final  . Comment 2 05/10/2018 Document in Chart   Final  . Glucose-Capillary 05/10/2018 126* 70 - 99 mg/dL Final  . Comment 1 05/10/2018 Notify RN   Final  . Comment 2 05/10/2018 Document in Chart   Final  . Glucose-Capillary 05/11/2018 116* 70 - 99 mg/dL Final  . WBC 05/11/2018 10.8  3.6 - 11.0 K/uL Final  . RBC 05/11/2018 3.81  3.80 - 5.20 MIL/uL Final  . Hemoglobin 05/11/2018 10.9* 12.0 - 16.0 g/dL Final  . HCT 05/11/2018 32.8* 35.0 - 47.0 % Final  . MCV 05/11/2018 86.1  80.0 - 100.0 fL Final  . MCH 05/11/2018 28.5  26.0 - 34.0 pg Final  . MCHC 05/11/2018 33.1  32.0 - 36.0 g/dL Final  . RDW 05/11/2018 13.3  11.5 - 14.5 % Final  . Platelets 05/11/2018 145* 150 - 440 K/uL  Final   Performed at [facility], [address], [place], [place] [address]  . Glucose-Capillary 05/11/2018 132* 70 - 99 mg/dL Final    STUDIES: No results found.  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  PLAN:    1. 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  She has now completed 5 years of letrozole and has been instructed to discontinue treatment after she completes her current prescription.  Her most recent mammogram on May 31, 2019 was reported as BI-RADS 2.  Repeat in July 2021.  Return to clinic in 1 year for routine evaluation. 2.  Osteopenia: Patient's most recent bone mineral density on May 31, 2019 reported a T score of -1.3.  This is worse than 2 years prior where the T score was reported as -0.1.  Continue calcium and vitamin D supplementation.  Repeat bone marrow density in July 2021.     Patient expressed understanding and was in agreement with this plan. She also understands that She can call clinic at any time with any questions, concerns, or complaints.    Lloyd Huger, MD   06/20/2019 9:40 AM

## 2019-06-18 ENCOUNTER — Other Ambulatory Visit: Payer: Self-pay

## 2019-06-19 ENCOUNTER — Other Ambulatory Visit: Payer: Self-pay

## 2019-06-19 ENCOUNTER — Inpatient Hospital Stay: Payer: Medicare HMO | Attending: Oncology | Admitting: Oncology

## 2019-06-19 ENCOUNTER — Encounter: Payer: Self-pay | Admitting: Oncology

## 2019-06-19 VITALS — BP 148/84 | HR 57 | Resp 18

## 2019-06-19 DIAGNOSIS — M8580 Other specified disorders of bone density and structure, unspecified site: Secondary | ICD-10-CM | POA: Insufficient documentation

## 2019-06-19 DIAGNOSIS — Z170 Estrogen receptor positive status [ER+]: Secondary | ICD-10-CM | POA: Insufficient documentation

## 2019-06-19 DIAGNOSIS — C50511 Malignant neoplasm of lower-outer quadrant of right female breast: Secondary | ICD-10-CM | POA: Diagnosis not present

## 2019-06-19 DIAGNOSIS — I10 Essential (primary) hypertension: Secondary | ICD-10-CM | POA: Insufficient documentation

## 2019-06-19 DIAGNOSIS — Z803 Family history of malignant neoplasm of breast: Secondary | ICD-10-CM | POA: Diagnosis not present

## 2019-06-19 DIAGNOSIS — Z79899 Other long term (current) drug therapy: Secondary | ICD-10-CM | POA: Diagnosis not present

## 2019-06-19 DIAGNOSIS — Z8673 Personal history of transient ischemic attack (TIA), and cerebral infarction without residual deficits: Secondary | ICD-10-CM | POA: Diagnosis not present

## 2019-06-19 DIAGNOSIS — Z7984 Long term (current) use of oral hypoglycemic drugs: Secondary | ICD-10-CM | POA: Insufficient documentation

## 2019-06-19 DIAGNOSIS — Z79811 Long term (current) use of aromatase inhibitors: Secondary | ICD-10-CM | POA: Insufficient documentation

## 2019-06-19 NOTE — Progress Notes (Signed)
Patient denies any concerns today.  

## 2019-06-26 ENCOUNTER — Inpatient Hospital Stay
Admission: EM | Admit: 2019-06-26 | Discharge: 2019-06-28 | DRG: 872 | Disposition: A | Discharge status: Home / Self Care | Payer: Medicare Other | Attending: Internal Medicine | Admitting: Internal Medicine

## 2019-06-26 ENCOUNTER — Encounter: Payer: Self-pay | Admitting: Emergency Medicine

## 2019-06-26 ENCOUNTER — Emergency Department: Payer: Medicare Other

## 2019-06-26 ENCOUNTER — Other Ambulatory Visit: Payer: Self-pay

## 2019-06-26 DIAGNOSIS — Z6835 Body mass index (BMI) 35.0-35.9, adult: Secondary | ICD-10-CM

## 2019-06-26 DIAGNOSIS — Z89421 Acquired absence of other right toe(s): Secondary | ICD-10-CM

## 2019-06-26 DIAGNOSIS — R531 Weakness: Secondary | ICD-10-CM

## 2019-06-26 DIAGNOSIS — I69334 Monoplegia of upper limb following cerebral infarction affecting left non-dominant side: Secondary | ICD-10-CM | POA: Diagnosis not present

## 2019-06-26 DIAGNOSIS — Z923 Personal history of irradiation: Secondary | ICD-10-CM

## 2019-06-26 DIAGNOSIS — R52 Pain, unspecified: Secondary | ICD-10-CM | POA: Diagnosis present

## 2019-06-26 DIAGNOSIS — I959 Hypotension, unspecified: Secondary | ICD-10-CM | POA: Diagnosis not present

## 2019-06-26 DIAGNOSIS — E1151 Type 2 diabetes mellitus with diabetic peripheral angiopathy without gangrene: Secondary | ICD-10-CM | POA: Diagnosis not present

## 2019-06-26 DIAGNOSIS — Z7984 Long term (current) use of oral hypoglycemic drugs: Secondary | ICD-10-CM

## 2019-06-26 DIAGNOSIS — E785 Hyperlipidemia, unspecified: Secondary | ICD-10-CM | POA: Diagnosis present

## 2019-06-26 DIAGNOSIS — Z853 Personal history of malignant neoplasm of breast: Secondary | ICD-10-CM

## 2019-06-26 DIAGNOSIS — Z20828 Contact with and (suspected) exposure to other viral communicable diseases: Secondary | ICD-10-CM | POA: Diagnosis present

## 2019-06-26 DIAGNOSIS — I10 Essential (primary) hypertension: Secondary | ICD-10-CM | POA: Diagnosis not present

## 2019-06-26 DIAGNOSIS — A419 Sepsis, unspecified organism: Principal | ICD-10-CM | POA: Diagnosis present

## 2019-06-26 DIAGNOSIS — N3000 Acute cystitis without hematuria: Secondary | ICD-10-CM | POA: Diagnosis not present

## 2019-06-26 DIAGNOSIS — Z803 Family history of malignant neoplasm of breast: Secondary | ICD-10-CM | POA: Diagnosis not present

## 2019-06-26 DIAGNOSIS — Z833 Family history of diabetes mellitus: Secondary | ICD-10-CM

## 2019-06-26 DIAGNOSIS — Z7902 Long term (current) use of antithrombotics/antiplatelets: Secondary | ICD-10-CM

## 2019-06-26 DIAGNOSIS — N179 Acute kidney failure, unspecified: Secondary | ICD-10-CM | POA: Diagnosis not present

## 2019-06-26 DIAGNOSIS — Z79899 Other long term (current) drug therapy: Secondary | ICD-10-CM

## 2019-06-26 LAB — COMPREHENSIVE METABOLIC PANEL
ALT: 16 U/L (ref 0–44)
AST: 24 U/L (ref 15–41)
Albumin: 3.2 g/dL — ABNORMAL LOW (ref 3.5–5.0)
Alkaline Phosphatase: 84 U/L (ref 38–126)
Anion gap: 8 (ref 5–15)
BUN: 28 mg/dL — ABNORMAL HIGH (ref 8–23)
CO2: 22 mmol/L (ref 22–32)
Calcium: 8.5 mg/dL — ABNORMAL LOW (ref 8.9–10.3)
Chloride: 106 mmol/L (ref 98–111)
Creatinine, Ser: 1.6 mg/dL — ABNORMAL HIGH (ref 0.44–1.00)
GFR calc Af Amer: 38 mL/min — ABNORMAL LOW (ref >60)
GFR calc non Af Amer: 33 mL/min — ABNORMAL LOW (ref >60)
Glucose, Bld: 183 mg/dL — ABNORMAL HIGH (ref 70–99)
Potassium: 4 mmol/L (ref 3.5–5.1)
Sodium: 136 mmol/L (ref 135–145)
Total Bilirubin: 0.6 mg/dL (ref 0.3–1.2)
Total Protein: 6.2 g/dL — ABNORMAL LOW (ref 6.5–8.1)

## 2019-06-26 LAB — CBC WITH DIFFERENTIAL/PLATELET
Abs Immature Granulocytes: 0.09 10*3/uL — ABNORMAL HIGH (ref 0.00–0.07)
Basophils Absolute: 0.1 10*3/uL (ref 0.0–0.1)
Basophils Relative: 1 %
Eosinophils Absolute: 0 10*3/uL (ref 0.0–0.5)
Eosinophils Relative: 0 %
HCT: 34.7 % — ABNORMAL LOW (ref 36.0–46.0)
Hemoglobin: 11.3 g/dL — ABNORMAL LOW (ref 12.0–15.0)
Immature Granulocytes: 1 %
Lymphocytes Relative: 7 %
Lymphs Abs: 1 10*3/uL (ref 0.7–4.0)
MCH: 28.9 pg (ref 26.0–34.0)
MCHC: 32.6 g/dL (ref 30.0–36.0)
MCV: 88.7 fL (ref 80.0–100.0)
Monocytes Absolute: 0.8 10*3/uL (ref 0.1–1.0)
Monocytes Relative: 5 %
Neutro Abs: 12 10*3/uL — ABNORMAL HIGH (ref 1.7–7.7)
Neutrophils Relative %: 86 %
Platelets: 210 10*3/uL (ref 150–400)
RBC: 3.91 MIL/uL (ref 3.87–5.11)
RDW: 12.7 % (ref 11.5–15.5)
WBC: 13.9 10*3/uL — ABNORMAL HIGH (ref 4.0–10.5)
nRBC: 0 % (ref 0.0–0.2)

## 2019-06-26 LAB — URINALYSIS, COMPLETE (UACMP) WITH MICROSCOPIC
Bilirubin Urine: NEGATIVE
Glucose, UA: NEGATIVE mg/dL
Ketones, ur: NEGATIVE mg/dL
Nitrite: NEGATIVE
Protein, ur: NEGATIVE mg/dL
Specific Gravity, Urine: 1.014 (ref 1.005–1.030)
pH: 5 (ref 5.0–8.0)

## 2019-06-26 LAB — GLUCOSE, CAPILLARY: Glucose-Capillary: 136 mg/dL — ABNORMAL HIGH (ref 70–99)

## 2019-06-26 LAB — SARS CORONAVIRUS 2 BY RT PCR (HOSPITAL ORDER, PERFORMED IN ~~LOC~~ HOSPITAL LAB): SARS Coronavirus 2: NEGATIVE

## 2019-06-26 LAB — CK: Total CK: 73 U/L (ref 38–234)

## 2019-06-26 LAB — LACTIC ACID, PLASMA
Lactic Acid, Venous: 2 mmol/L (ref 0.5–1.9)
Lactic Acid, Venous: 1.6 mmol/L (ref 0.5–1.9)

## 2019-06-26 LAB — PROCALCITONIN: Procalcitonin: 0.23 ng/mL

## 2019-06-26 LAB — LIPASE, BLOOD: Lipase: 17 U/L (ref 11–51)

## 2019-06-26 IMAGING — DX PORTABLE CHEST - 1 VIEW
1 series · 1 of 1 positions shown · non-contrast
Comparison: [DATE]

CLINICAL DATA: Weakness and fever.

EXAM:
PORTABLE CHEST 1 VIEW

[chest ap]
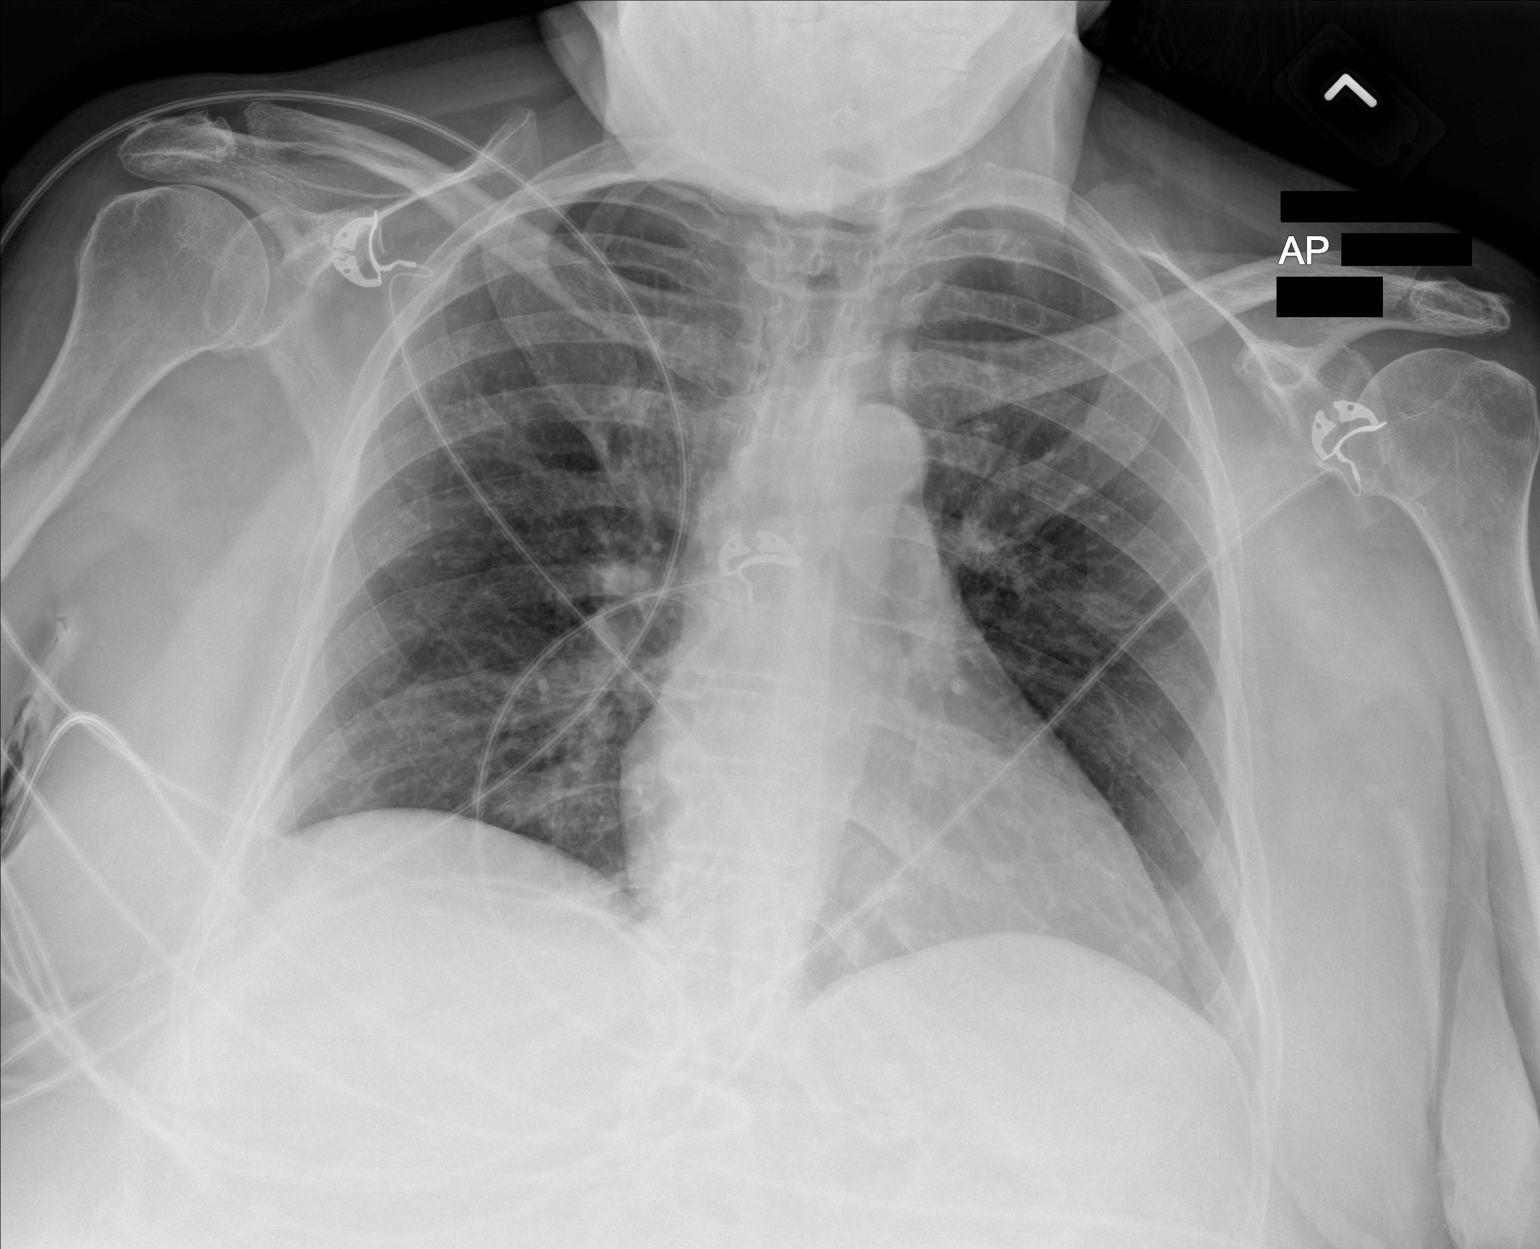

[1 of 1 positions shown; findings below may reference images not displayed]

FINDINGS: Cardiomediastinal silhouette is normal. Mediastinal contours appear
intact. Calcific atherosclerotic disease of the aorta.

There is no evidence of focal airspace consolidation, pleural
effusion or pneumothorax.

Osseous structures are without acute abnormality. Soft tissues are
grossly normal.
IMPRESSION: No active disease.

## 2019-06-26 MED ORDER — ONDANSETRON HCL 4 MG PO TABS: 4.0000 mg | ORAL_TABLET | Freq: Four times a day (QID) | ORAL | Status: DC | PRN

## 2019-06-26 MED ORDER — PAROXETINE HCL 20 MG PO TABS
40.0000 mg | ORAL_TABLET | Freq: Every day | ORAL | Status: DC
  Administered 2019-06-27 – 2019-06-28 (×2): 40 mg via ORAL
  Filled 2019-06-26 (×2): qty 2

## 2019-06-26 MED ORDER — ENOXAPARIN SODIUM 40 MG/0.4ML ~~LOC~~ SOLN
40.0000 mg | Freq: Every day | SUBCUTANEOUS | Status: DC
  Administered 2019-06-27: 40 mg via SUBCUTANEOUS
  Filled 2019-06-26: qty 0.4

## 2019-06-26 MED ORDER — SODIUM CHLORIDE 0.9 % IV SOLN
1.0000 g | INTRAVENOUS | Status: DC
  Administered 2019-06-27: 1 g via INTRAVENOUS
  Filled 2019-06-26: qty 10
  Filled 2019-06-26: qty 1

## 2019-06-26 MED ORDER — GABAPENTIN 300 MG PO CAPS
300.0000 mg | ORAL_CAPSULE | Freq: Three times a day (TID) | ORAL | Status: DC
  Administered 2019-06-27 – 2019-06-28 (×5): 300 mg via ORAL
  Filled 2019-06-26 (×5): qty 1

## 2019-06-26 MED ORDER — PRAVASTATIN SODIUM 40 MG PO TABS
40.0000 mg | ORAL_TABLET | Freq: Every day | ORAL | Status: DC
  Administered 2019-06-27: 40 mg via ORAL
  Filled 2019-06-26 (×2): qty 1

## 2019-06-26 MED ORDER — LETROZOLE 2.5 MG PO TABS
2.5000 mg | ORAL_TABLET | Freq: Every day | ORAL | Status: DC
  Administered 2019-06-27 – 2019-06-28 (×2): 2.5 mg via ORAL
  Filled 2019-06-26 (×2): qty 1

## 2019-06-26 MED ORDER — POLYETHYLENE GLYCOL 3350 17 G PO PACK: 17.0000 g | PACK | Freq: Every day | ORAL | Status: DC | PRN

## 2019-06-26 MED ORDER — ADULT MULTIVITAMIN W/MINERALS CH
1.0000 | ORAL_TABLET | Freq: Every day | ORAL | Status: DC
  Administered 2019-06-27 – 2019-06-28 (×2): 1 via ORAL
  Filled 2019-06-26 (×2): qty 1

## 2019-06-26 MED ORDER — INSULIN ASPART 100 UNIT/ML ~~LOC~~ SOLN: 0.0000 [IU] | Freq: Every day | SUBCUTANEOUS | Status: DC

## 2019-06-26 MED ORDER — LINAGLIPTIN 5 MG PO TABS
5.0000 mg | ORAL_TABLET | Freq: Every day | ORAL | Status: DC
  Administered 2019-06-27 – 2019-06-28 (×2): 5 mg via ORAL
  Filled 2019-06-26 (×2): qty 1

## 2019-06-26 MED ORDER — POTASSIUM CHLORIDE CRYS ER 10 MEQ PO TBCR
10.0000 meq | EXTENDED_RELEASE_TABLET | Freq: Every day | ORAL | Status: DC
  Administered 2019-06-27 – 2019-06-28 (×2): 10 meq via ORAL
  Filled 2019-06-26 (×2): qty 1

## 2019-06-26 MED ORDER — ONDANSETRON HCL 4 MG/2ML IJ SOLN: 4.0000 mg | Freq: Four times a day (QID) | INTRAMUSCULAR | Status: DC | PRN

## 2019-06-26 MED ORDER — SODIUM CHLORIDE 0.9 % IV SOLN
1.0000 g | Freq: Once | INTRAVENOUS | Status: AC
  Administered 2019-06-26: 1 g via INTRAVENOUS
  Filled 2019-06-26: qty 10

## 2019-06-26 MED ORDER — ACETAMINOPHEN 325 MG PO TABS: 650.0000 mg | ORAL_TABLET | Freq: Four times a day (QID) | ORAL | Status: DC | PRN

## 2019-06-26 MED ORDER — SODIUM CHLORIDE 0.9 % IV BOLUS
2000.0000 mL | Freq: Once | INTRAVENOUS | Status: AC
  Administered 2019-06-26: 2000 mL via INTRAVENOUS

## 2019-06-26 MED ORDER — ACETAMINOPHEN 650 MG RE SUPP: 650.0000 mg | Freq: Four times a day (QID) | RECTAL | Status: DC | PRN

## 2019-06-26 MED ORDER — INSULIN ASPART 100 UNIT/ML ~~LOC~~ SOLN
0.0000 [IU] | Freq: Three times a day (TID) | SUBCUTANEOUS | Status: DC
  Filled 2019-06-26: qty 1

## 2019-06-26 MED ORDER — SODIUM CHLORIDE 0.9 % IV SOLN
INTRAVENOUS | Status: DC
  Administered 2019-06-27 (×3): via INTRAVENOUS

## 2019-06-26 MED ORDER — CLOPIDOGREL BISULFATE 75 MG PO TABS
75.0000 mg | ORAL_TABLET | Freq: Every day | ORAL | Status: DC
  Administered 2019-06-27 – 2019-06-28 (×2): 75 mg via ORAL
  Filled 2019-06-26 (×2): qty 1

## 2019-06-26 MED ORDER — ALBUTEROL SULFATE (2.5 MG/3ML) 0.083% IN NEBU: 2.5000 mg | INHALATION_SOLUTION | Freq: Four times a day (QID) | RESPIRATORY_TRACT | Status: DC | PRN

## 2019-06-26 MED ORDER — LISINOPRIL 20 MG PO TABS
20.0000 mg | ORAL_TABLET | Freq: Every day | ORAL | Status: DC
  Administered 2019-06-27 – 2019-06-28 (×2): 20 mg via ORAL
  Filled 2019-06-26 (×2): qty 1

## 2019-06-26 NOTE — ED Notes (Addendum)
Pt's brief changed, x1 stool. Husband at bedside.

## 2019-06-26 NOTE — ED Notes (Signed)
Lab called r/t Hemoglobin A1c test that was ordered prior to this RN taking over care for this pt. Lab tech reports they have enough blood and/or correct tube to begin processing at this time.

## 2019-06-26 NOTE — Progress Notes (Signed)
CODE SEPSIS - PHARMACY COMMUNICATION  **Broad Spectrum Antibiotics should be administered within 1 hour of Sepsis diagnosis**  Time Code Sepsis Called/Page Received: (pager never went off)  Antibiotics Ordered: Rocephin 1gm q24h  Time of 1st antibiotic administration: 8/18 @ 2238  Additional action taken by pharmacy:  Consult removed from profile.   If necessary, Name of Provider/Nurse Contacted:    Ena Dawley ,PharmD Clinical Pharmacist  06/26/2019  11:43 PM

## 2019-06-26 NOTE — ED Provider Notes (Signed)
[facility] Emergency Department Provider Note  ____________________________________________  Time seen: Approximately 7:46 PM  I have reviewed the triage vital signs and the nursing notes.   HISTORY  Chief Complaint Weakness and Fever    HPI Elizabeth [name] is a 68 y.o. female with a history of diabetes hypertension lymphedema and prior stroke who comes the ED complaining of generalized weakness that she first noticed when waking up this morning.  She felt normal at bedtime last night.  Also subjectively felt like she had a fever and chills.  She took Tylenol at 5 PM today prior to coming to the emergency department.  She also endorses generalized body aches.  Symptoms are constant without aggravating or alleviating factors.  Denies chest pain shortness of breath cough vomiting diarrhea belly pain or any other pain complaints.  Denies any skin rashes.  No falls wounds or other injuries.      Past Medical History:  Diagnosis Date  . Cancer [facility]    breast  . Carcinoma of right breast ([place]) 05/20/2014  . Diabetes mellitus without complication ([place])   . Hypertension   . Lymphedema   . Personal history of radiation therapy 2015   Right breast  . PONV (postoperative nausea and vomiting)   . Psoriasis 1990  . Stroke ([place]) 2000   residual left arm weakness  . Varicose veins      Patient Active Problem List   Diagnosis Date Noted  . Sepsis ([place]) 05/10/2018  . Pressure injury of skin 05/10/2018  . 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 Type 2 diabetes mellitus with peripheral vascular disease ([place]) 01/28/2017  . Cellulitis 01/22/2017  . Hypokalemia 01/22/2017  . Morbid obesity ([place]) 09/23/2015  . Diabetes mellitus, type 2 ([place]) 05/21/2015  . Peripheral vascular disease ([place]) 05/21/2015  . 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 ([place]) 05/21/2015  . Chronic venous insufficiency 02/05/2015  . Mixed incontinence 10/24/2014  .  Extreme obesity 10/24/2014  . Hernia, rectovaginal 10/24/2014  . Airway hyperreactivity 02/19/2013  . Cerebral vascular accident ([place]) 02/19/2013  . Clinical depression 02/19/2013  . Diabetes mellitus type 2 in obese ([place]) 02/19/2013  . Colon, diverticulosis 02/19/2013  . Acid reflux 02/19/2013  . HLD (hyperlipidemia) 02/19/2013  . BP (high blood pressure) 02/19/2013     Past Surgical History:  Procedure Laterality Date  . AMPUTATION TOE Right 01/25/2017   Procedure: AMPUTATION TOE   ;  Surgeon: Albertine Patricia, DPM;  Location: [facility] ORS;  Service: Podiatry;  Laterality: Right;  . BREAST EXCISIONAL BIOPSY Right 2015   [facility] / with radiation  . BREAST LUMPECTOMY Right 04-2014   followed by radiation,  no chemo  . BREAST REDUCTION SURGERY  1977  . CHOLECYSTECTOMY N/A 09/23/2015   Procedure: LAPAROSCOPIC CHOLECYSTECTOMY;  Surgeon: Bonner Puna, MD;  Location: [facility] ORS;  Service: General;  Laterality: N/A;  . DILATION AND CURETTAGE OF UTERUS  2010  . EYE SURGERY Left 2015   cataracts and left eye detached retina repair  . EYE SURGERY Right 2013   cataract with len implant  . GASTRIC ROUX-EN-Y N/A 09/23/2015   Procedure: LAPAROSCOPIC ROUX-EN-Y GASTRIC;  Surgeon: Bonner Puna, MD;  Location: [facility] ORS;  Service: General;  Laterality: N/A;  . HIATAL HERNIA REPAIR N/A 09/23/2015   Procedure: LAPAROSCOPIC REPAIR OF HIATAL HERNIA;  Surgeon: Bonner Puna, MD;  Location: [facility] ORS;  Service: General;  Laterality: N/A;  . IRRIGATION AND DEBRIDEMENT FOOT Right 01/25/2017  Procedure: IRRIGATION AND DEBRIDEMENT FOOT;  Surgeon: Albertine Patricia, DPM;  Location: [facility] ORS;  Service: Podiatry;  Laterality: Right;  . IRRIGATION AND DEBRIDEMENT FOOT Right 01/29/2017   Procedure: IRRIGATION AND DEBRIDEMENT FOOT and application of wound vac;  Surgeon: Albertine Patricia, DPM;  Location: [facility] ORS;  Service: Podiatry;  Laterality: Right;  . LOWER EXTREMITY ANGIOGRAPHY Right 01/28/2017   Procedure: Lower Extremity  Angiography;  Surgeon: Katha Cabal, MD;  Location: [place] [facility];  Service: Cardiovascular;  Laterality: Right;  . LOWER EXTREMITY INTERVENTION  01/28/2017   Procedure: Lower Extremity Intervention;  Surgeon: Katha Cabal, MD;  Location: [place] [facility];  Service: Cardiovascular;;  . REDUCTION MAMMAPLASTY       Prior to Admission medications   Medication Sig Start Date End Date Taking? Authorizing Provider  albuterol (PROVENTIL HFA;VENTOLIN HFA) 108 (90 BASE) MCG/ACT inhaler Inhale 2 puffs into the lungs every 6 (six) hours as needed for wheezing or shortness of breath.    [provider]  calcium-vitamin D (OSCAL WITH D) 500-200 MG-UNIT tablet Take 2 tablets by mouth daily with breakfast. 11/23/16   Lloyd Huger, MD  clopidogrel (PLAVIX) 75 MG tablet Take 75 mg by mouth. 11/06/15   [provider]  gabapentin (NEURONTIN) 300 MG capsule Take 300 mg by mouth 3 (three) times daily.     [provider]  letrozole (FEMARA) 2.5 MG tablet Take 1 tablet (2.5 mg total) by mouth daily. 11/21/17   Lloyd Huger, MD  linagliptin (TRADJENTA) 5 MG TABS tablet Take 5 mg by mouth daily.    [provider]  lisinopril (PRINIVIL,ZESTRIL) 20 MG tablet Take 20 mg by mouth daily. 04/21/18   [provider]  metFORMIN (GLUCOPHAGE) 1000 MG tablet Take 1,000 mg by mouth 2 (two) times daily with a meal.    [provider]  PARoxetine (PAXIL) 40 MG tablet Take 40 mg by mouth daily.    [provider]  potassium chloride (MICRO-K) 10 MEQ CR capsule potassium chloride ER 10 mEq capsule,extended release 01/20/17   [provider]  pravastatin (PRAVACHOL) 40 MG tablet Take 40 mg by mouth daily. In afternoon    [provider]  TRUE METRIX BLOOD GLUCOSE TEST test strip  10/13/18   [provider]  TRUEPLUS LANCETS 33G [place]  10/13/18   [provider]     Allergies Patient has no known  allergies.   Family History  Problem Relation Age of Onset  . Diabetes Mother   . Cancer Mother   . Breast cancer Mother 60  . Diabetes Father   . Diabetes Sister   . Diabetes Brother     Social History Social History   Tobacco Use  . Smoking status: Never Smoker  . Smokeless tobacco: Never Used  Substance Use Topics  . Alcohol use: No    Alcohol/week: 0.0 standard drinks  . Drug use: No    Review of Systems  Constitutional:   Positive subjective fever and chills.  ENT:   No sore throat. No rhinorrhea. Cardiovascular:   No chest pain or syncope. Respiratory:   No dyspnea or cough. Gastrointestinal:   Negative for abdominal pain, vomiting and diarrhea.  Musculoskeletal:   Negative for focal pain or swelling All other systems reviewed and are negative except as documented above in ROS and HPI.  ____________________________________________   PHYSICAL EXAM:  VITAL SIGNS: ED Triage Vitals [06/26/19 1909]  Enc Vitals Group     BP [name])  85/48     Pulse Rate 75     Resp 16     Temp 99.3 F (37.4 C)     Temp Source Oral     SpO2 96 %     Weight 195 lb (88.5 kg)     Height 5\' 3"  (1.6 m)     Head Circumference      Peak Flow      Pain Score 0     Pain Loc      Pain Edu?      Excl. in [place]?     Vital signs reviewed, nursing assessments reviewed.   Constitutional:   Alert and oriented. Non-toxic appearance. Eyes:   Conjunctivae are normal. EOMI. PERRL. ENT      Head:   Normocephalic and atraumatic.      Nose:   No congestion/rhinnorhea.       Mouth/Throat:   MMM, no pharyngeal erythema. No peritonsillar mass.       Neck:   No meningismus. Full ROM. Hematological/Lymphatic/Immunilogical:   No cervical lymphadenopathy. Cardiovascular:   RRR. Symmetric bilateral radial and DP pulses.  No murmurs. Cap refill less than 2 seconds. Respiratory:   Normal respiratory effort without tachypnea/retractions. Breath sounds are clear and equal bilaterally. No  wheezes/rales/rhonchi. Gastrointestinal:   Soft and nontender. Non distended. There is no CVA tenderness.  No rebound, rigidity, or guarding.  Musculoskeletal:   Normal range of motion in all extremities. No joint effusions.  No lower extremity tenderness.  No edema. Neurologic:   Normal speech and language.  Motor grossly intact. No acute focal neurologic deficits are appreciated.  Skin:    Skin is warm, moist and intact. No rash noted.  No petechiae, purpura, or bullae.  ____________________________________________    LABS (pertinent positives/negatives) (all labs ordered are listed, but only abnormal results are displayed) Labs Reviewed  CULTURE, BLOOD (ROUTINE X 2)  CULTURE, BLOOD (ROUTINE X 2)  URINE CULTURE  SARS CORONAVIRUS 2 (HOSPITAL ORDER, [place] [facility])  LACTIC ACID, PLASMA  LACTIC ACID, PLASMA  COMPREHENSIVE METABOLIC PANEL  LIPASE, BLOOD  CBC WITH DIFFERENTIAL/PLATELET  PROCALCITONIN  URINALYSIS, COMPLETE (UACMP) WITH MICROSCOPIC  CK   ____________________________________________   EKG  Interpreted by me Normal sinus rhythm rate of 72, left axis, normal intervals.  Poor R wave progression.  Normal ST segments and T waves.  ____________________________________________    RADIOLOGY  No results found.  ____________________________________________   PROCEDURES Procedures  ____________________________________________  DIFFERENTIAL DIAGNOSIS   UTI, pneumonia, viral syndrome, COVID-19, dehydration, electrolyte abnormality  CLINICAL IMPRESSION / ASSESSMENT AND PLAN / ED COURSE  Medications ordered in the ED: Medications  sodium chloride 0.9 % bolus 2,000 mL (2,000 mLs Intravenous New Bag/Given 06/26/19 1941)    Pertinent labs & imaging results that were available during my care of the patient were reviewed by me and considered in my medical decision making (see chart for details).  Elizabeth [name] was evaluated in Emergency  Department on 06/26/2019 for the symptoms described in the history of present illness. She was evaluated in the context of the global COVID-19 pandemic, which necessitated consideration that the patient might be at risk for infection with the SARS-CoV-2 virus that causes COVID-19. Institutional protocols and algorithms that pertain to the evaluation of patients at risk for COVID-19 are in a state of rapid change based on information released by regulatory bodies including the CDC and federal and state organizations. These policies and algorithms were followed during the patient's  care in the ED.   Patient presents with generalized weakness.  Afebrile on arrival, vital signs are unremarkable except for low blood pressure.  Patient is not septic, but with her diabetes and subjective symptoms, will check labs including blood culture and lactate for screening, give 2 L IV fluids for hydration and reassess.   ----------------------------------------- 8:39 PM on 06/26/2019 -----------------------------------------  Care signed out to Dr. Charna Archer pending work-up.     ____________________________________________   FINAL CLINICAL IMPRESSION(S) / ED DIAGNOSES    Final diagnoses:  Generalized weakness  Body aches     ED Discharge Orders    None      Portions of this note were generated with dragon dictation software. Dictation errors may occur despite best attempts at proofreading.   Carrie Mew, MD 06/26/19 2039

## 2019-06-26 NOTE — ED Triage Notes (Signed)
Pt arrived via POV with husband reports weakness and fever that started this morning. Pt reports subjective fever, took tylenol.  Last dose of Tylenol was around 1700 today.  Pt diaphoretic on arrival states she had a lot of difficulty getting into the car. Pt states the weakness started when she woke up this morning.

## 2019-06-26 NOTE — ED Notes (Signed)
Date and time results received: 06/26/19 8:12 PM  Test: Lactic acid Critical Value: 2.0  Name of Provider Notified: Dr. Joni Fears  Orders Received? Or Actions Taken?: no new orders at this time

## 2019-06-26 NOTE — H&P (Signed)
[place] [facility] NAME: Elizabeth [name]    MR#:  [id number]  DATE OF BIRTH:  Oct 12, 1951  DATE OF ADMISSION:  06/26/2019  PRIMARY CARE PHYSICIAN: Marguerita Merles, MD   REQUESTING/REFERRING PHYSICIAN: Blake Divine, MD  CHIEF COMPLAINT:   Chief Complaint  Patient presents with  . Weakness  . Fever    HISTORY OF PRESENT ILLNESS:  Elizabeth [name]  is a 68 y.o. female with a known history of breast cancer, diabetes mellitus, hypertension, lymphedema, stroke with residual left arm weakness.  She presented to the emergency room complaining of generalized weakness worse over the last 1 to 2 days which she noticed becoming worse over since she woke this morning.  She has noted subjective fevers and chills.  However she denies nausea, vomiting, diarrhea, chest pain, or shortness of breath.  She denies cough.  She has noticed dark and malodorous urine however no significant dysuria, frequency, or urgency.  Labs on arrival with BUN of 28 creatinine 1.60 and WBC 13.9.  Lactic acid is 1.6.  Urinalysis demonstrates small amount leukocytes with many bacteria and 6-10 WBCs.  Patient became hypotensive after arrival with blood pressure 85/48.  She has received 2 L normal saline fluid bolus currently with normal saline infusing at 125 cc/h.  We are admitting her to the hospitalist service for sepsis and urinary tract infection.  Rapid COVID-19 testing is negative.  PAST MEDICAL HISTORY:   Past Medical History:  Diagnosis Date  . Cancer [facility])    breast  . Carcinoma of right breast ([place]) 05/20/2014  . Diabetes mellitus without complication ([place])   . Hypertension   . Lymphedema   . Personal history of radiation therapy 2015   Right breast  . PONV (postoperative nausea and vomiting)   . Psoriasis 1990  . Stroke ([place]) 2000   residual left arm weakness  . Varicose veins     PAST SURGICAL HISTORY:   Past Surgical History:  Procedure Laterality Date   . AMPUTATION TOE Right 01/25/2017   Procedure: AMPUTATION TOE   ;  Surgeon: Albertine Patricia, DPM;  Location: [facility] ORS;  Service: Podiatry;  Laterality: Right;  . BREAST EXCISIONAL BIOPSY Right 2015   [facility] / with radiation  . BREAST LUMPECTOMY Right 04-2014   followed by radiation,  no chemo  . BREAST REDUCTION SURGERY  1977  . CHOLECYSTECTOMY N/A 09/23/2015   Procedure: LAPAROSCOPIC CHOLECYSTECTOMY;  Surgeon: Bonner Puna, MD;  Location: [facility] ORS;  Service: General;  Laterality: N/A;  . DILATION AND CURETTAGE OF UTERUS  2010  . EYE SURGERY Left 2015   cataracts and left eye detached retina repair  . EYE SURGERY Right 2013   cataract with len implant  . GASTRIC ROUX-EN-Y N/A 09/23/2015   Procedure: LAPAROSCOPIC ROUX-EN-Y GASTRIC;  Surgeon: Bonner Puna, MD;  Location: [facility] ORS;  Service: General;  Laterality: N/A;  . HIATAL HERNIA REPAIR N/A 09/23/2015   Procedure: LAPAROSCOPIC REPAIR OF HIATAL HERNIA;  Surgeon: Bonner Puna, MD;  Location: [facility] ORS;  Service: General;  Laterality: N/A;  . IRRIGATION AND DEBRIDEMENT FOOT Right 01/25/2017   Procedure: IRRIGATION AND DEBRIDEMENT FOOT;  Surgeon: Albertine Patricia, DPM;  Location: [facility] ORS;  Service: Podiatry;  Laterality: Right;  . IRRIGATION AND DEBRIDEMENT FOOT Right 01/29/2017   Procedure: IRRIGATION AND DEBRIDEMENT FOOT and application of wound vac;  Surgeon: Albertine Patricia, DPM;  Location: [facility] ORS;  Service: Podiatry;  Laterality: Right;  . LOWER EXTREMITY  ANGIOGRAPHY Right 01/28/2017   Procedure: Lower Extremity Angiography;  Surgeon: Katha Cabal, MD;  Location: [place] [facility];  Service: Cardiovascular;  Laterality: Right;  . LOWER EXTREMITY INTERVENTION  01/28/2017   Procedure: Lower Extremity Intervention;  Surgeon: Katha Cabal, MD;  Location: [place] [facility];  Service: Cardiovascular;;  . REDUCTION MAMMAPLASTY      SOCIAL HISTORY:   Social History   Tobacco Use  . Smoking status: Never Smoker  . Smokeless  tobacco: Never Used  Substance Use Topics  . Alcohol use: No    Alcohol/week: 0.0 standard drinks    FAMILY HISTORY:   Family History  Problem Relation Age of Onset  . Diabetes Mother   . Cancer Mother   . Breast cancer Mother 47  . Diabetes Father   . Diabetes Sister   . Diabetes Brother     DRUG ALLERGIES:  No Known Allergies  REVIEW OF SYSTEMS:   Review of Systems  Constitutional: Positive for chills, fever and malaise/fatigue.  HENT: Negative for congestion, sinus pain and sore throat.   Eyes: Negative for blurred vision and double vision.  Respiratory: Negative for cough, hemoptysis, shortness of breath and wheezing.   Cardiovascular: Negative for chest pain, palpitations and leg swelling.  Gastrointestinal: Negative for abdominal pain, blood in stool, constipation, diarrhea, heartburn, nausea and vomiting.  Genitourinary: Negative for dysuria, flank pain, frequency, hematuria and urgency.  Musculoskeletal: Positive for myalgias. Negative for falls.  Skin: Negative for itching and rash.  Neurological: Negative for dizziness, weakness and headaches.  Psychiatric/Behavioral: Negative for depression.      MEDICATIONS AT HOME:   Prior to Admission medications   Medication Sig Start Date End Date Taking? Authorizing Provider  albuterol (PROVENTIL HFA;VENTOLIN HFA) 108 (90 BASE) MCG/ACT inhaler Inhale 2 puffs into the lungs every 6 (six) hours as needed for wheezing or shortness of breath.   Yes [provider]  calcium-vitamin D (OSCAL WITH D) 500-200 MG-UNIT tablet Take 2 tablets by mouth daily with breakfast. 11/23/16  Yes Lloyd Huger, MD  clopidogrel (PLAVIX) 75 MG tablet Take 75 mg by mouth. 11/06/15  Yes [provider]  gabapentin (NEURONTIN) 300 MG capsule Take 300 mg by mouth 3 (three) times daily.    Yes [provider]  letrozole (FEMARA) 2.5 MG tablet Take 1 tablet (2.5 mg total) by mouth daily. 11/21/17  Yes Lloyd Huger, MD  linagliptin (TRADJENTA) 5 MG TABS tablet Take 5 mg by mouth daily.   Yes [provider]  lisinopril (PRINIVIL,ZESTRIL) 20 MG tablet Take 20 mg by mouth daily. 04/21/18  Yes [provider]  metFORMIN (GLUCOPHAGE) 1000 MG tablet Take 1,000 mg by mouth daily with breakfast.    Yes [provider]  PARoxetine (PAXIL) 40 MG tablet Take 40 mg by mouth daily.   Yes [provider]  potassium chloride (MICRO-K) 10 MEQ CR capsule Take 10 mEq by mouth daily.  01/20/17  Yes [provider]  pravastatin (PRAVACHOL) 40 MG tablet Take 40 mg by mouth daily. In afternoon   Yes [provider]  TRUE METRIX BLOOD GLUCOSE TEST test strip  10/13/18  Yes [provider]  TRUEPLUS LANCETS 33G [place]  10/13/18  Yes [provider]      VITAL SIGNS:  Blood pressure 120/65, pulse 63, temperature 99.3 F (37.4 C), temperature source Oral, resp. rate 19, height 5\' 3"  (1.6 m), weight 88.5 kg, SpO2 96 %.  PHYSICAL EXAMINATION:  Physical  Exam  GENERAL:  68 y.o.-year-old patient lying in the bed with no acute distress.  EYES: Pupils equal, round, reactive to light and accommodation. No scleral icterus. Extraocular muscles intact.  HEENT: Head atraumatic, normocephalic. Oropharynx and nasopharynx clear.  NECK:  Supple, no jugular venous distention. No thyroid enlargement, no tenderness.  LUNGS: Normal breath sounds bilaterally, no wheezing, rales,rhonchi or crepitation. No use of accessory muscles of respiration.  CARDIOVASCULAR: Regular rate and rhythm, S1, S2 normal. No murmurs, rubs, or gallops.  ABDOMEN: Soft, nondistended, nontender. Bowel sounds present. No organomegaly or mass.  EXTREMITIES: No pedal edema, cyanosis, or clubbing.  NEUROLOGIC: Cranial nerves II through XII are intact. Muscle strength 5/5 in all extremities. Sensation intact. Gait not checked.  PSYCHIATRIC: The patient is alert and oriented x 3.  Normal affect and good eye  contact. SKIN: No obvious rash, lesion, or ulcer.   LABORATORY PANEL:   CBC Recent Labs  Lab 06/26/19 1920  WBC 13.9*  HGB 11.3*  HCT 34.7*  PLT 210   ------------------------------------------------------------------------------------------------------------------  Chemistries  Recent Labs  Lab 06/26/19 1920  NA 136  K 4.0  CL 106  CO2 22  GLUCOSE 183*  BUN 28*  CREATININE 1.60*  CALCIUM 8.5*  AST 24  ALT 16  ALKPHOS 84  BILITOT 0.6   ------------------------------------------------------------------------------------------------------------------  Cardiac Enzymes No results for input(s): TROPONINI in the last 168 hours. ------------------------------------------------------------------------------------------------------------------  RADIOLOGY:  Dg Chest Port 1 View  Result Date: 06/26/2019 CLINICAL DATA:  Weakness and fever. EXAM: PORTABLE CHEST 1 VIEW COMPARISON:  January 22, 2017 FINDINGS: Cardiomediastinal silhouette is normal. Mediastinal contours appear intact. Calcific atherosclerotic disease of the aorta. There is no evidence of focal airspace consolidation, pleural effusion or pneumothorax. Osseous structures are without acute abnormality. Soft tissues are grossly normal. IMPRESSION: No active disease. Electronically Signed   By: Fidela Salisbury M.D.   On: 06/26/2019 19:41      IMPRESSION AND PLAN:   1.  Sepsis - We will repeat lactic acid per protocol - She received 2 L normal saline bolus in the emergency room - Normal saline infusing to peripheral IV at 125 cc/h -Blood and urine cultures pending - IV Rocephin - CBC and BMP in the a.m.  2.  Urinary tract infection - Urine culture pending - Will treat with IV Rocephin and adjust therapy based on urine culture results  3.  Acute kidney injury - Likely secondary to dehydration with UTI/sepsis - Patient received IV fluid bolus of 2 L normal saline with current normal saline infusing at 125  cc/h for fluid resuscitation -We will repeat CBC and BMP in the a.m. we will continue to monitor renal function closely and get renal ultrasound if no improvement or if worse.  4.  Diabetes mellitus - Sliding scale insulin  DVT and PPI prophylaxis    All the records are reviewed and case discussed with ED provider. The plan of care was discussed in details with the patient (and family). I answered all questions. The patient agreed to proceed with the above mentioned plan. Further management will depend upon hospital course.   CODE STATUS: Full code  TOTAL TIME TAKING CARE OF THIS PATIENT: 45 minutes.    [place] 06/26/2019 at 10:49 PM  Pager - [phone]  After 6pm go to [email or web address] - Proofreader  [facility]  Office  [phone]  CC: Primary care physician; Marguerita Merles, MD   Note: This dictation was prepared with Dragon dictation  along with smaller phrase technology. Any transcriptional errors that result from this process are unintentional.

## 2019-06-26 NOTE — ED Notes (Signed)
ED TO INPATIENT HANDOFF REPORT  ED Nurse Name and Phone #: Daiva Nakayama RN [phone]  S Name/Age/Gender Elizabeth [name] 68 y.o. female Room/Bed: ED16A/ED16A  Code Status   Code Status: Full Code  Home/SNF/Other Home Patient oriented to: self, place, time and situation Is this baseline? Yes   Triage Complete: Triage complete  Chief Complaint Weakness  Triage Note Pt arrived via [place] with husband reports weakness and fever that started this morning. Pt reports subjective fever, took tylenol.  Last dose of Tylenol was around 1700 today.  Pt diaphoretic on arrival states she had a lot of difficulty getting into the car. Pt states the weakness started when she woke up this morning.   Allergies No Known Allergies  Level of Care/Admitting Diagnosis ED Disposition    ED Disposition Condition [facility] Area: [place] [[id number]]  Level of Care: Med-Surg [16]  Covid Evaluation: Confirmed COVID Negative  Diagnosis: Sepsis [facility]) [[id number]]  Admitting Physician: [name] Camel [[id number]]  Attending Physician: [name] Camel [[id number]]  Estimated length of stay: past midnight tomorrow  Certification:: I certify this patient will need inpatient services for at least 2 midnights  PT Class (Do Not Modify): Inpatient [101]  PT Acc Code (Do Not Modify): Private [1]       B Medical/Surgery History Past Medical History:  Diagnosis Date  . Cancer [facility])    breast  . Carcinoma of right breast ([place]) 05/20/2014  . Diabetes mellitus without complication ([place])   . Hypertension   . Lymphedema   . Personal history of radiation therapy 2015   Right breast  . PONV (postoperative nausea and vomiting)   . Psoriasis 1990  . Stroke ([place]) 2000   residual left arm weakness  . Varicose veins    Past Surgical History:  Procedure Laterality Date  . AMPUTATION TOE Right 01/25/2017   Procedure: AMPUTATION TOE   ;  Surgeon: Albertine Patricia, DPM;  Location: [facility] ORS;   Service: Podiatry;  Laterality: Right;  . BREAST EXCISIONAL BIOPSY Right 2015   [facility] / with radiation  . BREAST LUMPECTOMY Right 04-2014   followed by radiation,  no chemo  . BREAST REDUCTION SURGERY  1977  . CHOLECYSTECTOMY N/A 09/23/2015   Procedure: LAPAROSCOPIC CHOLECYSTECTOMY;  Surgeon: Bonner Puna, MD;  Location: [facility] ORS;  Service: General;  Laterality: N/A;  . DILATION AND CURETTAGE OF UTERUS  2010  . EYE SURGERY Left 2015   cataracts and left eye detached retina repair  . EYE SURGERY Right 2013   cataract with len implant  . GASTRIC ROUX-EN-Y N/A 09/23/2015   Procedure: LAPAROSCOPIC ROUX-EN-Y GASTRIC;  Surgeon: Bonner Puna, MD;  Location: [facility] ORS;  Service: General;  Laterality: N/A;  . HIATAL HERNIA REPAIR N/A 09/23/2015   Procedure: LAPAROSCOPIC REPAIR OF HIATAL HERNIA;  Surgeon: Bonner Puna, MD;  Location: [facility] ORS;  Service: General;  Laterality: N/A;  . IRRIGATION AND DEBRIDEMENT FOOT Right 01/25/2017   Procedure: IRRIGATION AND DEBRIDEMENT FOOT;  Surgeon: Albertine Patricia, DPM;  Location: [facility] ORS;  Service: Podiatry;  Laterality: Right;  . IRRIGATION AND DEBRIDEMENT FOOT Right 01/29/2017   Procedure: IRRIGATION AND DEBRIDEMENT FOOT and application of wound vac;  Surgeon: Albertine Patricia, DPM;  Location: [facility] ORS;  Service: Podiatry;  Laterality: Right;  . LOWER EXTREMITY ANGIOGRAPHY Right 01/28/2017   Procedure: Lower Extremity Angiography;  Surgeon: Katha Cabal, MD;  Location: [place] [facility];  Service: Cardiovascular;  Laterality: Right;  .  LOWER EXTREMITY INTERVENTION  01/28/2017   Procedure: Lower Extremity Intervention;  Surgeon: Katha Cabal, MD;  Location: [place] [facility];  Service: Cardiovascular;;  . REDUCTION MAMMAPLASTY       A IV Location/Drains/Wounds Patient Lines/Drains/Airways Status   Active Line/Drains/Airways    Name:   Placement date:   Placement time:   Site:   Days:   Peripheral IV 06/26/19 Left Forearm   06/26/19    1934     Forearm   less than 1   Peripheral IV 06/26/19 Left Antecubital   06/26/19    1939    Antecubital   less than 1   Pressure Injury 05/10/18 Stage I -  Intact skin with non-blanchable redness of a localized area usually over a bony prominence.   05/10/18    0657     412          Intake/Output Last 24 hours  Intake/Output Summary (Last 24 hours) at 06/26/2019 2331 Last data filed at 06/26/2019 2307 Gross per 24 hour  Intake 100 ml  Output -  Net 100 ml    Labs/Imaging Results for orders placed or performed during the hospital encounter of 06/26/19 (from the past 48 hour(s))  Lactic acid, plasma     Status: Abnormal   Collection Time: 06/26/19  7:20 PM  Result Value Ref Range   Lactic Acid, Venous 2.0 (HH) 0.5 - 1.9 mmol/L    Comment: CRITICAL RESULT CALLED TO, READ BACK BY AND VERIFIED WITH ALICIA GRANGER @2014  06/26/19 MJU Performed at [facility], [address] [place], [place] [address]   Comprehensive metabolic panel     Status: Abnormal   Collection Time: 06/26/19  7:20 PM  Result Value Ref Range   Sodium 136 135 - 145 mmol/L   Potassium 4.0 3.5 - 5.1 mmol/L   Chloride 106 98 - 111 mmol/L   CO2 22 22 - 32 mmol/L   Glucose, Bld 183 (H) 70 - 99 mg/dL   BUN 28 (H) 8 - 23 mg/dL   Creatinine, Ser 1.60 (H) 0.44 - 1.00 mg/dL   Calcium 8.5 (L) 8.9 - 10.3 mg/dL   Total Protein 6.2 (L) 6.5 - 8.1 g/dL   Albumin 3.2 (L) 3.5 - 5.0 g/dL   AST 24 15 - 41 U/L   ALT 16 0 - 44 U/L   Alkaline Phosphatase 84 38 - 126 U/L   Total Bilirubin 0.6 0.3 - 1.2 mg/dL   GFR calc non Af Amer 33 (L) >60 mL/min   GFR calc Af Amer 38 (L) >60 mL/min   Anion gap 8 5 - 15    Comment: Performed at [facility], [address] [place], [place] [address]  Lipase, blood     Status: None   Collection Time: 06/26/19  7:20 PM  Result Value Ref Range   Lipase 17 11 - 51 U/L    Comment: Performed at [facility], [address] [place], [place] [address]  CBC WITH  DIFFERENTIAL     Status: Abnormal   Collection Time: 06/26/19  7:20 PM  Result Value Ref Range   WBC 13.9 (H) 4.0 - 10.5 K/uL   RBC 3.91 3.87 - 5.11 MIL/uL   Hemoglobin 11.3 (L) 12.0 - 15.0 g/dL   HCT 34.7 (L) 36.0 - 46.0 %   MCV 88.7 80.0 - 100.0 fL   MCH 28.9 26.0 - 34.0 pg   MCHC 32.6 30.0 - 36.0 g/dL   RDW 12.7  11.5 - 15.5 %   Platelets 210 150 - 400 K/uL   nRBC 0.0 0.0 - 0.2 %   Neutrophils Relative % 86 %   Neutro Abs 12.0 (H) 1.7 - 7.7 K/uL   Lymphocytes Relative 7 %   Lymphs Abs 1.0 0.7 - 4.0 K/uL   Monocytes Relative 5 %   Monocytes Absolute 0.8 0.1 - 1.0 K/uL   Eosinophils Relative 0 %   Eosinophils Absolute 0.0 0.0 - 0.5 K/uL   Basophils Relative 1 %   Basophils Absolute 0.1 0.0 - 0.1 K/uL   Immature Granulocytes 1 %   Abs Immature Granulocytes 0.09 (H) 0.00 - 0.07 K/uL    Comment: Performed at [facility], [address] [place], [place] [address]  Procalcitonin     Status: None   Collection Time: 06/26/19  7:20 PM  Result Value Ref Range   Procalcitonin 0.23 ng/mL    Comment:        Interpretation: PCT (Procalcitonin) <= 0.5 ng/mL: Systemic infection (sepsis) is not likely. Local bacterial infection is possible. (NOTE)       Sepsis PCT Algorithm           Lower Respiratory Tract                                      Infection PCT Algorithm    ----------------------------     ----------------------------         PCT < 0.25 ng/mL                PCT < 0.10 ng/mL         Strongly encourage             Strongly discourage   discontinuation of antibiotics    initiation of antibiotics    ----------------------------     -----------------------------       PCT 0.25 - 0.50 ng/mL            PCT 0.10 - 0.25 ng/mL               OR       >80% decrease in PCT            Discourage initiation of                                            antibiotics      Encourage discontinuation           of antibiotics    ----------------------------      -----------------------------         PCT >= 0.50 ng/mL              PCT 0.26 - 0.50 ng/mL               AND        <80% decrease in PCT             Encourage initiation of                                             antibiotics       Encourage continuation  of antibiotics    ----------------------------     -----------------------------        PCT >= 0.50 ng/mL                  PCT > 0.50 ng/mL               AND         increase in PCT                  Strongly encourage                                      initiation of antibiotics    Strongly encourage escalation           of antibiotics                                     -----------------------------                                           PCT <= 0.25 ng/mL                                                 OR                                        > 80% decrease in PCT                                     Discontinue / Do not initiate                                             antibiotics Performed at [facility], [address] [place], [place] [address]   SARS Coronavirus 2 [facility] order, Performed in [facility] hospital lab) Nasopharyngeal Nasopharyngeal Swab     Status: None   Collection Time: 06/26/19  7:20 PM   Specimen: Nasopharyngeal Swab  Result Value Ref Range   SARS Coronavirus 2 NEGATIVE NEGATIVE    Comment: (NOTE) If result is NEGATIVE SARS-CoV-2 target nucleic acids are NOT DETECTED. The SARS-CoV-2 RNA is generally detectable in upper and lower  respiratory specimens during the acute phase of infection. The lowest  concentration of SARS-CoV-2 viral copies this assay can detect is 250  copies / mL. A negative result does not preclude SARS-CoV-2 infection  and should not be used as the sole basis for treatment or other  patient management decisions.  A negative result may occur with  improper specimen collection / handling, submission of specimen other  than nasopharyngeal swab, presence  of viral mutation(s) within the  areas targeted by this assay, and inadequate number of viral copies  (<250 copies / mL). A negative result must be combined with clinical  observations, patient history, and epidemiological information. If result is POSITIVE SARS-CoV-2 target nucleic acids are DETECTED. The SARS-CoV-2 RNA is generally detectable in upper and lower  respiratory specimens dur ing the acute phase of infection.  Positive  results are indicative of active infection with SARS-CoV-2.  Clinical  correlation with patient history and other diagnostic information is  necessary to determine patient infection status.  Positive results do  not rule out bacterial infection or co-infection with other viruses. If result is PRESUMPTIVE POSTIVE SARS-CoV-2 nucleic acids MAY BE PRESENT.   A presumptive positive result was obtained on the submitted specimen  and confirmed on repeat testing.  While 2019 novel coronavirus  (SARS-CoV-2) nucleic acids may be present in the submitted sample  additional confirmatory testing may be necessary for epidemiological  and / or clinical management purposes  to differentiate between  SARS-CoV-2 and other Sarbecovirus currently known to infect humans.  If clinically indicated additional testing with an alternate test  methodology [phone]) is advised. The SARS-CoV-2 RNA is generally  detectable in upper and lower respiratory sp ecimens during the acute  phase of infection. The expected result is Negative. Fact Sheet for Patients:  StrictlyIdeas.no Fact Sheet for Healthcare Providers: BankingDealers.co.za This test is not yet approved or cleared by the [place] FDA and has been authorized for detection and/or diagnosis of SARS-CoV-2 by FDA under an Emergency Use Authorization (EUA).  This EUA will remain in effect (meaning this test can be used) for the duration of the COVID-19 declaration under Section  564(b)(1) of the Act, 21 U.S.C. section 360bbb-3(b)(1), unless the authorization is terminated or revoked sooner. Performed at [facility], [address] [place], [place] [address]   CK     Status: None   Collection Time: 06/26/19  7:20 PM  Result Value Ref Range   Total CK 73 38 - 234 U/L    Comment: Performed at [facility], [address] [place], [place] [address]  Lactic acid, plasma     Status: None   Collection Time: 06/26/19  9:17 PM  Result Value Ref Range   Lactic Acid, Venous 1.6 0.5 - 1.9 mmol/L    Comment: Performed at [facility], [address] [place], [place] [address]  Urinalysis, Complete w Microscopic     Status: Abnormal   Collection Time: 06/26/19  9:45 PM  Result Value Ref Range   Color, Urine YELLOW (A) YELLOW   APPearance HAZY (A) CLEAR   Specific Gravity, Urine 1.014 1.005 - 1.030   pH 5.0 5.0 - 8.0   Glucose, UA NEGATIVE NEGATIVE mg/dL   Hgb urine dipstick SMALL (A) NEGATIVE   Bilirubin Urine NEGATIVE NEGATIVE   Ketones, ur NEGATIVE NEGATIVE mg/dL   Protein, ur NEGATIVE NEGATIVE mg/dL   Nitrite NEGATIVE NEGATIVE   Leukocytes,Ua SMALL (A) NEGATIVE   RBC / HPF 0-5 0 - 5 RBC/hpf   WBC, UA 6-10 0 - 5 WBC/hpf   Bacteria, UA MANY (A) NONE SEEN   Squamous Epithelial / LPF 0-5 0 - 5   Mucus PRESENT     Comment: Performed at [facility], [address] [place],  [address]  Glucose, capillary     Status: Abnormal   Collection Time: 06/26/19 11:09 PM  Result Value Ref Range   Glucose-Capillary 136 (H) 70 - 99 mg/dL   Dg Chest Port 1 View  Result Date: 06/26/2019 CLINICAL DATA:  Weakness and fever. EXAM: PORTABLE CHEST 1 VIEW COMPARISON:  January 22, 2017 FINDINGS: Cardiomediastinal silhouette is normal. Mediastinal contours appear intact. Calcific atherosclerotic disease of the aorta. There is no evidence of focal airspace consolidation, pleural effusion or pneumothorax. Osseous structures are without  acute abnormality. Soft tissues are grossly normal. IMPRESSION: No active disease. Electronically Signed   By: Fidela Salisbury M.D.   On: 06/26/2019 19:41    Pending Labs Unresulted Labs (From admission, onward)    Start     Ordered   07/03/19 0500  Creatinine, serum  (enoxaparin (LOVENOX)    CrCl >/= 30 ml/min)  Weekly,   STAT    Comments: while on enoxaparin therapy    06/26/19 2326   06/27/19 0500  Protime-INR  Tomorrow morning,   STAT     06/26/19 2326   06/27/19 0500  Cortisol-am, blood  Tomorrow morning,   STAT     06/26/19 2326   06/27/19 0500  Procalcitonin  Tomorrow morning,   STAT     06/26/19 2326   06/27/19 6967  Basic metabolic panel  Tomorrow morning,   STAT     06/26/19 2326   06/27/19 0500  CBC  Tomorrow morning,   STAT     06/26/19 2326   06/26/19 2327  HIV antibody (Routine Testing)  Once,   STAT     06/26/19 2326   06/26/19 2327  CBC  (enoxaparin (LOVENOX)    CrCl >/= 30 ml/min)  Once,   STAT    Comments: Baseline for enoxaparin therapy IF NOT ALREADY DRAWN.  Notify MD if PLT < 100 K.    06/26/19 2326   06/26/19 2327  Creatinine, serum  (enoxaparin (LOVENOX)    CrCl >/= 30 ml/min)  Once,   STAT    Comments: Baseline for enoxaparin therapy IF NOT ALREADY DRAWN.    06/26/19 2326   06/26/19 2327  TSH  Once,   STAT     06/26/19 2326   06/26/19 2249  Hemoglobin A1c  Once,   STAT    Comments: To assess prior glycemic control    06/26/19 2248   06/26/19 2000  Culture, blood (Routine X 2) w Reflex to ID Panel  Once,   R     06/26/19 2000   06/26/19 1923  Blood Culture (routine x 2)  BLOOD CULTURE X 2,   STAT     06/26/19 1923   06/26/19 1923  Urine culture  ONCE - STAT,   STAT     06/26/19 1923          Vitals/Pain Today's Vitals   06/26/19 2200 06/26/19 2230 06/26/19 2311 06/26/19 2311  BP: 110/61 120/65  104/71  Pulse: 65 63  71  Resp: 20 19  18   Temp:      TempSrc:      SpO2: 97% 96%  94%  Weight:      Height:      PainSc:   0-No pain      Isolation Precautions No active isolations  Medications Medications  letrozole [facility]) tablet 2.5 mg (has no administration in time range)  albuterol (VENTOLIN HFA) 108 (90 Base) MCG/ACT inhaler 2 puff (has no administration in time range)  clopidogrel (PLAVIX) tablet 75 mg (has no administration in time range)  gabapentin (NEURONTIN) capsule 300 mg (has no administration in time range)  linagliptin (TRADJENTA) tablet 5 mg (has no administration in time range)  lisinopril (ZESTRIL) tablet 20 mg (has no administration in time range)  PARoxetine (PAXIL) tablet 40 mg (has no administration in time  range)  potassium chloride SA (K-DUR) CR tablet 10 mEq (has no administration in time range)  pravastatin (PRAVACHOL) tablet 40 mg (has no administration in time range)  enoxaparin (LOVENOX) injection 40 mg (has no administration in time range)  0.9 %  sodium chloride infusion (has no administration in time range)  cefTRIAXone (ROCEPHIN) 1 g in sodium chloride 0.9 % 100 mL IVPB (has no administration in time range)  acetaminophen (TYLENOL) tablet 650 mg (has no administration in time range)    Or  acetaminophen (TYLENOL) suppository 650 mg (has no administration in time range)  polyethylene glycol (MIRALAX / GLYCOLAX) packet 17 g (has no administration in time range)  ondansetron (ZOFRAN) tablet 4 mg (has no administration in time range)    Or  ondansetron (ZOFRAN) injection 4 mg (has no administration in time range)  multivitamin with minerals tablet 1 tablet (has no administration in time range)  insulin aspart (novoLOG) injection 0-15 Units (has no administration in time range)  insulin aspart (novoLOG) injection 0-5 Units (0 Units Subcutaneous Not Given 06/26/19 2310)  sodium chloride 0.9 % bolus 2,000 mL (0 mLs Intravenous Stopped 06/26/19 2157)  cefTRIAXone (ROCEPHIN) 1 g in sodium chloride 0.9 % 100 mL IVPB (0 g Intravenous Stopped 06/26/19 2307)    Mobility walks with device Low  fall risk   Focused Assessments    R Recommendations: See Admitting Provider Note  Report given to:   Additional Notes: None

## 2019-06-26 NOTE — ED Notes (Signed)
Husband advised to wait until pt is able to have a visitor.  Husband can be reached at [phone] Margie Billet)

## 2019-06-26 NOTE — ED Provider Notes (Signed)
-----------------------------------------   8:25 PM on 06/26/2019 -----------------------------------------  Blood pressure (!) 94/44, pulse 68, temperature 99.3 F (37.4 C), temperature source Oral, resp. rate 13, height 5\' 3"  (1.6 m), weight 88.5 kg, SpO2 94 %.  Assuming care from Dr. Joni Fears.  In short, Elizabeth [name] is a 68 y.o. female with a chief complaint of Weakness and Fever .  Refer to the original H&P for additional details.  The current plan of care is to follow-up sepsis work-up to determine any possible source of infection.  UA positive for UTI, will treat with Rocephin.  Patient's blood pressure continues to improve following 2 L IV fluid bolus.  Given her initial hypotension and overall ill appearance, case discussed with hospitalist, who accepts patient for admission.    Blake Divine, MD 06/27/19 [phone]

## 2019-06-27 ENCOUNTER — Other Ambulatory Visit: Payer: Self-pay

## 2019-06-27 DIAGNOSIS — R52 Pain, unspecified: Secondary | ICD-10-CM | POA: Diagnosis not present

## 2019-06-27 DIAGNOSIS — A419 Sepsis, unspecified organism: Secondary | ICD-10-CM | POA: Diagnosis not present

## 2019-06-27 LAB — GLUCOSE, CAPILLARY
Glucose-Capillary: 93 mg/dL (ref 70–99)
Glucose-Capillary: 141 mg/dL — ABNORMAL HIGH (ref 70–99)
Glucose-Capillary: 126 mg/dL — ABNORMAL HIGH (ref 70–99)
Glucose-Capillary: 107 mg/dL — ABNORMAL HIGH (ref 70–99)

## 2019-06-27 LAB — GASTROINTESTINAL PANEL BY PCR, STOOL (REPLACES STOOL CULTURE)

## 2019-06-27 LAB — PROTIME-INR
INR: 1.2 (ref 0.8–1.2)
Prothrombin Time: 15.1 seconds (ref 11.4–15.2)

## 2019-06-27 LAB — CBC
HCT: 30.5 % — ABNORMAL LOW (ref 36.0–46.0)
Hemoglobin: 9.8 g/dL — ABNORMAL LOW (ref 12.0–15.0)
MCH: 28.7 pg (ref 26.0–34.0)
MCHC: 32.1 g/dL (ref 30.0–36.0)
MCV: 89.4 fL (ref 80.0–100.0)
Platelets: 172 10*3/uL (ref 150–400)
RBC: 3.41 MIL/uL — ABNORMAL LOW (ref 3.87–5.11)
RDW: 12.9 % (ref 11.5–15.5)
WBC: 11 10*3/uL — ABNORMAL HIGH (ref 4.0–10.5)
nRBC: 0 % (ref 0.0–0.2)

## 2019-06-27 LAB — BASIC METABOLIC PANEL
Anion gap: 5 (ref 5–15)
BUN: 26 mg/dL — ABNORMAL HIGH (ref 8–23)
CO2: 22 mmol/L (ref 22–32)
Calcium: 7.8 mg/dL — ABNORMAL LOW (ref 8.9–10.3)
Chloride: 110 mmol/L (ref 98–111)
Creatinine, Ser: 1.2 mg/dL — ABNORMAL HIGH (ref 0.44–1.00)
GFR calc Af Amer: 54 mL/min — ABNORMAL LOW (ref >60)
GFR calc non Af Amer: 47 mL/min — ABNORMAL LOW (ref >60)
Glucose, Bld: 121 mg/dL — ABNORMAL HIGH (ref 70–99)
Potassium: 3.6 mmol/L (ref 3.5–5.1)
Sodium: 137 mmol/L (ref 135–145)

## 2019-06-27 LAB — C DIFFICILE QUICK SCREEN W PCR REFLEX
C Diff antigen: NEGATIVE
C Diff interpretation: NOT DETECTED
C Diff toxin: NEGATIVE

## 2019-06-27 LAB — PROCALCITONIN: Procalcitonin: 0.31 ng/mL

## 2019-06-27 LAB — TSH: TSH: 0.989 u[IU]/mL (ref 0.350–4.500)

## 2019-06-27 LAB — HEMOGLOBIN A1C
Hgb A1c MFr Bld: 5.3 % (ref 4.8–5.6)
Mean Plasma Glucose: 105.41 mg/dL

## 2019-06-27 LAB — CORTISOL-AM, BLOOD: Cortisol - AM: 9.5 ug/dL (ref 6.7–22.6)

## 2019-06-27 NOTE — Evaluation (Signed)
Physical Therapy Evaluation Patient Details Name: Elizabeth [name] MRN: [id number] DOB: [birth year] Today's Date: 06/27/2019   History of Present Illness  presented to ER secondary to progressive weakness, fever/chills and hypotension; admitted for management of sepsis related to UTI.  Clinical Impression  Upon evaluation, patient alert and oriented; follows commands and agreeable to session with min encouragement from therapist.  Noted with bowel incontinence upon initial movement attempts; dep assist for hygiene and clothing/linen change at bed level.  Patient seemingly unaware of incontinence until addressed by therapist. Bilat UE/LE strength and ROM grossly symmetrical and WFL; no focal weakness, sensory or coordination deficit appreciated. Denies pain at this time.  Able to complete bed mobility with mod indep (increased time); sit/stand, basic transfers and gait (200') with RW, cga/min assist.  Decreased cadence, but no overt buckling or LOB; min cuing for walker management (not to pick up from floor).  Do recommend continued use of walker at this time.  Patient voices agreement and understanding, voicing optimal comfort with assist device. Would benefit from skilled PT to address above deficits and promote optimal return to PLOF; Recommend transition to [facility] upon discharge from acute hospitalization.     Follow Up Recommendations Home health PT    Equipment Recommendations  Rolling walker with 5" wheels    Recommendations for Other Services       Precautions / Restrictions Precautions Precautions: Fall Restrictions Weight Bearing Restrictions: No      Mobility  Bed Mobility Overal bed mobility: Modified Independent             General bed mobility comments: increased time, heavy use of UE support to complete  Transfers Overall transfer level: Needs assistance Equipment used: Rolling walker (2 wheeled) Transfers: Sit to/from Stand Sit to Stand: Min assist          General transfer comment: assist for lift off and balance to preven tposterior LOB  Ambulation/Gait Ambulation/Gait assistance: Min guard Gait Distance (Feet): 200 Feet Assistive device: Rolling walker (2 wheeled)       General Gait Details: reciprocal stepping pattern, slow but fairly steady cadence; voices optimal comfort with use of RW.  Completes head turns, start/stop and changes of direction without LOB; min cuing not to pick up walker at times  Stairs            Wheelchair Mobility    Modified Rankin (Stroke Patients Only)       Balance Overall balance assessment: Needs assistance Sitting-balance support: No upper extremity supported;Feet supported Sitting balance-Leahy Scale: Good     Standing balance support: Bilateral upper extremity supported Standing balance-Leahy Scale: Fair                               Pertinent Vitals/Pain Pain Assessment: No/denies pain    Home Living Family/patient expects to be discharged to:: Private residence Living Arrangements: Spouse/significant other Available Help at Discharge: Family Type of Home: House Home Access: Stairs to enter Entrance Stairs-Rails: Can reach both;Right;Left Entrance Stairs-Number of Steps: 4 Home Layout: One level Home Equipment: [place] - 4 wheels;Cane - single point      Prior Function Level of Independence: Independent with assistive device(s)         Comments: Mod indep with intermittent use of SPC ("when I feel weak"); denies recent fall history     Hand Dominance        Extremity/Trunk Assessment   Upper Extremity Assessment Upper Extremity  Assessment: Overall WFL for tasks assessed    Lower Extremity Assessment Lower Extremity Assessment: Overall WFL for tasks assessed(grossly at least 4-/5 throughout)       Communication   Communication: No difficulties  Cognition Arousal/Alertness: Awake/alert Behavior During Therapy: WFL for tasks  assessed/performed Overall Cognitive Status: Within Functional Limits for tasks assessed                                        General Comments      Exercises Other Exercises Other Exercises: Toilet transfer, ambulatory with RW, cga/min assist; sit/stand from [facility] with RW, cga/min assist; standing balance at sink for hand hygiene, cga/close sup.  Standing functional reach 3-4" from immediate BOS; contranlateral UE support to stabilize or extend reach otherwise. Other Exercises: Bed mobility, rolling bilat, for management of incontinent bowel, mod indep; dep assist for hygiene, clothing, linen change   Assessment/Plan    PT Assessment Patient needs continued PT services  PT Problem List Decreased strength;Decreased balance;Decreased activity tolerance;Decreased mobility       PT Treatment Interventions DME instruction;Gait training;Stair training;Functional mobility training;Therapeutic activities;Therapeutic exercise;Balance training;Patient/family education    PT Goals (Current goals can be found in the Care Plan section)  Acute Rehab PT Goals Patient Stated Goal: to return home PT Goal Formulation: With patient Time For Goal Achievement: 07/11/19 Potential to Achieve Goals: Good    Frequency Min 2X/week   Barriers to discharge        Co-evaluation               AM-PAC PT "6 Clicks" Mobility  Outcome Measure Help needed turning from your back to your side while in a flat bed without using bedrails?: None Help needed moving from lying on your back to sitting on the side of a flat bed without using bedrails?: None Help needed moving to and from a bed to a chair (including a wheelchair)?: A Little Help needed standing up from a chair using your arms (e.g., wheelchair or bedside chair)?: A Little Help needed to walk in hospital room?: A Little Help needed climbing 3-5 steps with a railing? : A Little 6 Click Score: 20    End of Session Equipment  Utilized During Treatment: Gait belt Activity Tolerance: Patient tolerated treatment well Patient left: in chair;with call bell/phone within reach;with chair alarm set Nurse Communication: Mobility status PT Visit Diagnosis: Muscle weakness (generalized) (M62.81);Difficulty in walking, not elsewhere classified (R26.2)    Time: 8592-9244 PT Time Calculation (min) (ACUTE ONLY): 34 min   Charges:   PT Evaluation $PT Eval Moderate Complexity: 1 Mod PT Treatments $Therapeutic Activity: 8-22 mins       Bethene Hankinson H. Owens Shark, PT, DPT, NCS 06/27/19, 1:33 PM [phone]

## 2019-06-27 NOTE — Progress Notes (Signed)
[facility] [place] NAME: Alfonso Shackett    MR#:  [id number]  DATE OF BIRTH:  May 06, 1951  SUBJECTIVE:    REVIEW OF SYSTEMS:   ROS Tolerating Diet: Tolerating PT:   DRUG ALLERGIES:  No Known Allergies  VITALS:  Blood pressure (!) 155/81, pulse 68, temperature 98.6 F (37 C), temperature source Oral, resp. rate 18, height 5\' 3"  (1.6 m), weight 90.2 kg, SpO2 97 %.  PHYSICAL EXAMINATION:   Physical Exam  GENERAL:  68 y.o.-year-old patient lying in the bed with no acute distress.  EYES: Pupils equal, round, reactive to light and accommodation. No scleral icterus. Extraocular muscles intact.  HEENT: Head atraumatic, normocephalic. Oropharynx and nasopharynx clear.  NECK:  Supple, no jugular venous distention. No thyroid enlargement, no tenderness.  LUNGS: Normal breath sounds bilaterally, no wheezing, rales, rhonchi. No use of accessory muscles of respiration.  CARDIOVASCULAR: S1, S2 normal. No murmurs, rubs, or gallops.  ABDOMEN: Soft, nontender, nondistended. Bowel sounds present. No organomegaly or mass.  EXTREMITIES: No cyanosis, clubbing or edema b/l.    NEUROLOGIC: Cranial nerves II through XII are intact. No focal Motor or sensory deficits b/l.   PSYCHIATRIC:  patient is alert and oriented x 3.  SKIN: No obvious rash, lesion, or ulcer.   LABORATORY PANEL:  CBC Recent Labs  Lab 06/27/19 0506  WBC 11.0*  HGB 9.8*  HCT 30.5*  PLT 172    Chemistries  Recent Labs  Lab 06/26/19 1920 06/27/19 0506  NA 136 137  K 4.0 3.6  CL 106 110  CO2 22 22  GLUCOSE 183* 121*  BUN 28* 26*  CREATININE 1.60* 1.20*  CALCIUM 8.5* 7.8*  AST 24  --   ALT 16  --   ALKPHOS 84  --   BILITOT 0.6  --    Cardiac Enzymes No results for input(s): TROPONINI in the last 168 hours. RADIOLOGY:  Dg Chest Port 1 View  Result Date: 06/26/2019 CLINICAL DATA:  Weakness and fever. EXAM: PORTABLE CHEST 1 VIEW COMPARISON:  January 22, 2017  FINDINGS: Cardiomediastinal silhouette is normal. Mediastinal contours appear intact. Calcific atherosclerotic disease of the aorta. There is no evidence of focal airspace consolidation, pleural effusion or pneumothorax. Osseous structures are without acute abnormality. Soft tissues are grossly normal. IMPRESSION: No active disease. Electronically Signed   By: Fidela Salisbury M.D.   On: 06/26/2019 19:41   ASSESSMENT AND PLAN:  Elizabeth [name]  is a 68 y.o. female with a known history of breast cancer, diabetes mellitus, hypertension, lymphedema, stroke with residual left arm weakness.  She presented to the emergency room complaining of generalized weakness worse over the last 1 to 2 days which she noticed becoming worse over since she woke this morning  1.  Sepsis due to UTI--improved - She received 2 L normal saline bolus in the emergency room - Normal saline infusing to peripheral IV at 125 cc/h--change to 75 cc and then d/c eating well -Blood  cultures negative - IV Rocephin. -c dif and stool PCR negative  2.  Urinary tract infection - Urine culture pending - Will treat with IV Rocephin and adjust therapy based on urine culture results  3.  Acute kidney injury - Likely secondary to dehydration with UTI/sepsis -encourage oral fluid intake.  4.  Diabetes mellitus - Sliding scale insulin  Patient overall improving. If she continues to show improvement she will discharged tomorrow. Patient is in agreement with plan.  CODE STATUS: full  DVT Prophylaxis:lovenox  TOTAL TIME TAKING CARE OF THIS PATIENT: *30* minutes.  >50% time spent on counselling and coordination of care  POSSIBLE D/C IN *1-2* DAYS, DEPENDING ON CLINICAL CONDITION.  Note: This dictation was prepared with Dragon dictation along with smaller phrase technology. Any transcriptional errors that result from this process are unintentional.  Fritzi Mandes M.D on 06/27/2019 at 3:48 PM  Between 7am to 6pm - Pager -  [phone]  After 6pm go to [email or web address] - password [email or web address] [facility]  Office  [phone]  CC: Primary care physician; Marguerita Merles, MDPatient ID: [name], female   DOB: 03-08-1951, 68 y.o.   MRN: [id number]

## 2019-06-27 NOTE — Evaluation (Signed)
Occupational Therapy Evaluation Patient Details Name: Elizabeth [name] MRN: [id number] DOB: 03/31/1951 Today's Date: 06/27/2019    History of Present Illness Pt is 68 y/o F with PMH DM, BRCA, HTN, lymphedema, and stroke with residual L UE weakness. Pt presented to ER secondary to progressive weakness, fever/chills and hypotension; admitted for management of sepsis related to UTI.   Clinical Impression   Pt seen for OT evaluation this date. Prior to hospital admission, pt was mostly Indep with fxl mobility, but reports occasional use of SPC.  Pt lives in [facility] with her spouse.  Currently pt demonstrates impairments in standing balance and tolerance requiring MIN A with fxl mobiltiy and ADL transfers with FWW.  Pt would benefit from skilled OT to address noted impairments and functional limitations (see below for any additional details) in order to maximize safety and independence while minimizing falls risk and caregiver burden.  Upon hospital discharge, recommend pt discharge to home with HHOT to assess for safety considerations/enviornmental modification needs for fall prevention.    Follow Up Recommendations  Home health OT    Equipment Recommendations  3 in 1 bedside commode    Recommendations for Other Services       Precautions / Restrictions Precautions Precautions: Fall Restrictions Weight Bearing Restrictions: No      Mobility Bed Mobility Overal bed mobility: Modified Independent             General bed mobility comments: use of bed rail and UEs, HOB elevated ~20 degrees for sup to sit, flattened for sit to sup  Transfers Overall transfer level: Needs assistance Equipment used: Rolling walker (2 wheeled) Transfers: Sit to/from Stand Sit to Stand: Min assist         General transfer comment: assist for lift off and balance to preven tposterior LOB    Balance Overall balance assessment: Needs assistance Sitting-balance support: No upper extremity  supported;Feet supported Sitting balance-Leahy Scale: Good     Standing balance support: Bilateral upper extremity supported Standing balance-Leahy Scale: Fair                             ADL either performed or assessed with clinical judgement   ADL Overall ADL's : Needs assistance/impaired Eating/Feeding: Independent   Grooming: Wash/dry hands;Wash/dry face;Set up;Sitting   Upper Body Bathing: Modified independent;Supervision/ safety;Sitting   Lower Body Bathing: Set up;Supervison/ safety;Minimal assistance;Sit to/from stand   Upper Body Dressing : Modified independent;Supervision/safety;Sitting   Lower Body Dressing: Supervision/safety;Minimal assistance Lower Body Dressing Details (indicate cue type and reason): Pt can thread socks/underwear while seated EOB, requires MIN A only for standing balance with clothing mgt over hips using FWW. Toilet Transfer: Min Retail banker and Hygiene: Min guard;Minimal assistance;Sit to/from stand Toileting - Clothing Manipulation Details (indicate cue type and reason): with FWW Tub/ Shower Transfer: Minimal assistance;Grab bars;Rolling walker Tub/Shower Transfer Details (indicate cue type and reason): based on clinical observation Functional mobility during ADLs: Min guard;Rolling walker       Vision         Perception     Praxis      Pertinent Vitals/Pain Pain Assessment: No/denies pain     Hand Dominance     Extremity/Trunk Assessment Upper Extremity Assessment Upper Extremity Assessment: RUE deficits/detail;LUE deficits/detail RUE Deficits / Details: shld/elbow flex/ext 4/5, grip 4+/5 LUE Deficits / Details: shld/elbow flex/ext 3/5, grip 3+/5   Lower Extremity Assessment Lower Extremity Assessment: Defer to PT evaluation  Communication Communication Communication: No difficulties   Cognition Arousal/Alertness: Awake/alert Behavior During Therapy: WFL for tasks  assessed/performed Overall Cognitive Status: Within Functional Limits for tasks assessed                                     General Comments       Exercises Other Exercises Other Exercises: OT facilitates education re: safety with use of FWW including safe hand placement. Other Exercises: OT facilitates education re: fall prevention including controlling stand to sit by reaching back, use of call light for fall prevention. Pt verbalized understanding.   Shoulder Instructions      Home Living Family/patient expects to be discharged to:: Private residence Living Arrangements: Spouse/significant other Available Help at Discharge: Family Type of Home: House Home Access: Stairs to enter Technical brewer of Steps: 4 Entrance Stairs-Rails: Can reach both;Right;Left Home Layout: One level     Bathroom Shower/Tub: Teacher, early years/pre: [place]: Environmental consultant - 4 wheels;Cane - single point;Shower seat   Additional Comments: Pt lives with spouse who assists pt as needed. Pt states she did not need assistance with ADLs. States she was able to drive short distances, but that husband would drive if pt needed to go to appt in [place].      Prior Functioning/Environment Level of Independence: Independent with assistive device(s)        Comments: Mod indep with intermittent use of SPC ("when I feel weak"); denies recent fall history        OT Problem List: Decreased strength;Decreased range of motion;Decreased activity tolerance;Impaired balance (sitting and/or standing);Decreased coordination;Decreased safety awareness;Decreased knowledge of use of DME or AE;Decreased knowledge of precautions      OT Treatment/Interventions: Self-care/ADL training;Therapeutic exercise;DME and/or AE instruction;Therapeutic activities;Patient/family education;Balance training    OT Goals(Current goals can be found in the care plan section) Acute Rehab  OT Goals Patient Stated Goal: to return home OT Goal Formulation: With patient Time For Goal Achievement: 07/11/19 Potential to Achieve Goals: Good  OT Frequency: Min 1X/week   Barriers to D/C:            Co-evaluation              AM-PAC OT "6 Clicks" Daily Activity     Outcome Measure Help from another person eating meals?: None Help from another person taking care of personal grooming?: None Help from another person toileting, which includes using toliet, bedpan, or urinal?: A Little Help from another person bathing (including washing, rinsing, drying)?: A Little Help from another person to put on and taking off regular upper body clothing?: None Help from another person to put on and taking off regular lower body clothing?: A Little 6 Click Score: 21   End of Session Equipment Utilized During Treatment: Gait belt;Rolling walker Nurse Communication: (OT was in next pt's room and couldn't recall if this pt's bed alarm was set. Called CNA to make sure alarm set and CNA confirms.)  Activity Tolerance: Patient tolerated treatment well Patient left: in bed;with call bell/phone within reach  OT Visit Diagnosis: Unsteadiness on feet (R26.81);Muscle weakness (generalized) (M62.81)                Time: 1245-8099 OT Time Calculation (min): 23 min Charges:  OT General Charges $OT Visit: 1 Visit OT Evaluation $OT Eval Moderate Complexity: 1 Mod OT Treatments $Therapeutic Activity: 8-22 mins  Gerrianne Scale, MS, OTR/L ascom [phone] or [phone] 06/27/19, 3:48 PM

## 2019-06-28 DIAGNOSIS — A419 Sepsis, unspecified organism: Secondary | ICD-10-CM | POA: Diagnosis not present

## 2019-06-28 DIAGNOSIS — R52 Pain, unspecified: Secondary | ICD-10-CM | POA: Diagnosis not present

## 2019-06-28 LAB — GLUCOSE, CAPILLARY
Glucose-Capillary: 104 mg/dL — ABNORMAL HIGH (ref 70–99)
Glucose-Capillary: 108 mg/dL — ABNORMAL HIGH (ref 70–99)
Glucose-Capillary: 102 mg/dL — ABNORMAL HIGH (ref 70–99)

## 2019-06-28 LAB — HIV ANTIBODY (ROUTINE TESTING W REFLEX): HIV Screen 4th Generation wRfx: NONREACTIVE

## 2019-06-28 MED ORDER — CEPHALEXIN 500 MG PO CAPS: 500.0000 mg | ORAL_CAPSULE | Freq: Three times a day (TID) | ORAL | 0 refills | Status: AC

## 2019-06-28 MED ORDER — CEPHALEXIN 500 MG PO CAPS
500.0000 mg | ORAL_CAPSULE | Freq: Three times a day (TID) | ORAL | Status: DC
  Administered 2019-06-28: 500 mg via ORAL
  Filled 2019-06-28: qty 1

## 2019-06-28 NOTE — Progress Notes (Signed)
Discharge order received. Patient mental status is at baseline. Vital signs stable . No signs of acute distress. Discharge instructions given. Patient verbalized understanding. No other issues noted at this time.   

## 2019-06-28 NOTE — Progress Notes (Signed)
Physical Therapy Treatment Patient Details Name: Elizabeth [name] MRN: [id number] DOB: 10/13/1951 Today's Date: 06/28/2019    History of Present Illness Pt is 68 y/o F with PMH DM, BRCA, HTN, lymphedema, and stroke with residual L UE weakness. Pt presented to ER secondary to progressive weakness, fever/chills and hypotension; admitted for management of sepsis related to UTI.    PT Comments    Pt in bed, inc urine.  Bed saturated despite pur-wick system.  Pt aware but did not alert staff.  Education provided.  To edge of bed without assist.  Stood with min a x 1 with post/right LOB. Assist to recover.  She is able to walk to bathroom then continue on to complete lap around unit.  Education provided during session for fall prevention, hand placements, balance awareness and need for +1 assist with walker upon discharge for safety.  She voiced understanding and stated her husband will he home to help her.  No further questions or concerns regarding discharge.   Follow Up Recommendations  Home health PT;Supervision for mobility/OOB     Equipment Recommendations  Rolling walker with 5" wheels    Recommendations for Other Services       Precautions / Restrictions Precautions Precautions: Fall Restrictions Weight Bearing Restrictions: No    Mobility  Bed Mobility Overal bed mobility: Modified Independent                Transfers Overall transfer level: Needs assistance Equipment used: Rolling walker (2 wheeled) Transfers: Sit to/from Stand Sit to Stand: Min assist         General transfer comment: assist for lift off and balance to preven tposterior LOB  Ambulation/Gait Ambulation/Gait assistance: Min guard Gait Distance (Feet): 200 Feet Assistive device: Rolling walker (2 wheeled) Gait Pattern/deviations: Step-through pattern;Wide base of support;Drifts right/left;Decreased step length - right;Decreased step length - left Gait velocity: decreased   General Gait  Details: verbal cues for safety, some post lean with gait with inc risk for post LOB during tasks   Stairs             Wheelchair Mobility    Modified Rankin (Stroke Patients Only)       Balance Overall balance assessment: Independent Sitting-balance support: No upper extremity supported;Feet supported Sitting balance-Leahy Scale: Good     Standing balance support: Bilateral upper extremity supported Standing balance-Leahy Scale: Fair Standing balance comment: some inc post lean in standing.                            Cognition Arousal/Alertness: Awake/alert Behavior During Therapy: WFL for tasks assessed/performed Overall Cognitive Status: Within Functional Limits for tasks assessed                                        Exercises Other Exercises Other Exercises: to commode to void.    General Comments        Pertinent Vitals/Pain Pain Assessment: No/denies pain    Home Living                      Prior Function            PT Goals (current goals can now be found in the care plan section) Progress towards PT goals: Progressing toward goals    Frequency    Min 2X/week  PT Plan Current plan remains appropriate    Co-evaluation              AM-PAC PT "6 Clicks" Mobility   Outcome Measure  Help needed turning from your back to your side while in a flat bed without using bedrails?: None Help needed moving from lying on your back to sitting on the side of a flat bed without using bedrails?: None Help needed moving to and from a bed to a chair (including a wheelchair)?: A Little Help needed standing up from a chair using your arms (e.g., wheelchair or bedside chair)?: A Little Help needed to walk in hospital room?: A Little Help needed climbing 3-5 steps with a railing? : A Little 6 Click Score: 20    End of Session Equipment Utilized During Treatment: Gait belt Activity Tolerance: Patient  tolerated treatment well Patient left: in chair;with call bell/phone within reach;with chair alarm set         Time: 2763-9432 PT Time Calculation (min) (ACUTE ONLY): 19 min  Charges:  $Gait Training: 8-22 mins                     Chesley Noon, PTA 06/28/19, 1:23 PM

## 2019-06-28 NOTE — Discharge Summary (Signed)
[place] [facility] [place] NAME: Elizabeth [name]    MR#:  [id number]  DATE OF BIRTH:  04/09/1951  DATE OF ADMISSION:  06/26/2019 ADMITTING PHYSICIAN: Elizabeth Ojie, MD  DATE OF DISCHARGE: 06/28/19  PRIMARY CARE PHYSICIAN: Elizabeth Merles, MD    ADMISSION DIAGNOSIS:  Body aches [R52] Acute cystitis without hematuria [N30.00] Generalized weakness [R53.1] Sepsis without acute organ dysfunction, due to unspecified organism ([place]) [A41.9]  DISCHARGE DIAGNOSIS:  sepsis on admission resolved UTI acute renal failure improved  SECONDARY DIAGNOSIS:   Past Medical History:  Diagnosis Date  . Cancer [facility])    breast  . Carcinoma of right breast ([place]) 05/20/2014  . Diabetes mellitus without complication ([place])   . Hypertension   . Lymphedema   . Personal history of radiation therapy 2015   Right breast  . PONV (postoperative nausea and vomiting)   . Psoriasis 1990  . Stroke ([place]) 2000   residual left arm weakness  . Varicose veins     HOSPITAL COURSE:   Elizabeth [name] a62 y.o.femalewith a known history of breast cancer, diabetes mellitus, hypertension, lymphedema, stroke with residual left arm weakness. She presented to the emergency room complaining of generalized weakness worse over the last 1 to 2 days which she noticed becoming worse over since she woke this morning  1.Sepsis due to UTI--improved -She received 2 L normal saline bolus in the emergency room -Normal saline infusing to peripheral IV at 125 cc/h--change to 75 cc and then d/c fluids since pt eating well -Blood  cultures negative -IV Rocephin-- change to PO Keflex six more days -c dif and stool PCR negative  2. Urinary tract infection -Urine culture negative rods -afebrile  3. Acute kidney injury -Likely secondary to dehydration with UTI/sepsis -encourage oral fluid intake.  4. Diabetes mellitus -Sliding scale insulin  Physical therapy  recommends home health PT or range. Patient will discharged to home finish outpatient antibiotics follow-up with primary care physician. She is in agreement with the plan CONSULTS OBTAINED:    DRUG ALLERGIES:  No Known Allergies  DISCHARGE MEDICATIONS:   Allergies as of 06/28/2019   No Known Allergies     Medication List    TAKE these medications   albuterol 108 (90 Base) MCG/ACT inhaler Commonly known as: VENTOLIN HFA Inhale 2 puffs into the lungs every 6 (six) hours as needed for wheezing or shortness of breath.   calcium-vitamin D 500-200 MG-UNIT tablet Commonly known as: OSCAL WITH D Take 2 tablets by mouth daily with breakfast.   cephALEXin 500 MG capsule Commonly known as: KEFLEX Take 1 capsule (500 mg total) by mouth 3 (three) times daily for 6 days.   clopidogrel 75 MG tablet Commonly known as: PLAVIX Take 75 mg by mouth.   gabapentin 300 MG capsule Commonly known as: NEURONTIN Take 300 mg by mouth 3 (three) times daily.   letrozole 2.5 MG tablet Commonly known as: FEMARA Take 1 tablet (2.5 mg total) by mouth daily.   linagliptin 5 MG Tabs tablet Commonly known as: TRADJENTA Take 5 mg by mouth daily.   lisinopril 20 MG tablet Commonly known as: ZESTRIL Take 20 mg by mouth daily.   metFORMIN 1000 MG tablet Commonly known as: GLUCOPHAGE Take 1,000 mg by mouth daily with breakfast.   PARoxetine 40 MG tablet Commonly known as: PAXIL Take 40 mg by mouth daily.   potassium chloride 10 MEQ CR capsule Commonly known as: MICRO-K Take 10 mEq by mouth daily.  pravastatin 40 MG tablet Commonly known as: PRAVACHOL Take 40 mg by mouth daily. In afternoon   True Metrix Blood Glucose Test test strip Generic drug: glucose blood   TRUEplus Lancets 33G Misc       If you experience worsening of your admission symptoms, develop shortness of breath, life threatening emergency, suicidal or homicidal thoughts you must seek medical attention immediately by  calling 911 or calling your MD immediately  if symptoms less severe.  You Must read complete instructions/literature along with all the possible adverse reactions/side effects for all the Medicines you take and that have been prescribed to you. Take any new Medicines after you have completely understood and accept all the possible adverse reactions/side effects.   Please note  You were cared for by a hospitalist during your hospital stay. If you have any questions about your discharge medications or the care you received while you were in the hospital after you are discharged, you can call the unit and asked to speak with the hospitalist on call if the hospitalist that took care of you is not available. Once you are discharged, your primary care physician will handle any further medical issues. Please note that NO REFILLS for any discharge medications will be authorized once you are discharged, as it is imperative that you return to your primary care physician (or establish a relationship with a primary care physician if you do not have one) for your aftercare needs so that they can reassess your need for medications and monitor your lab values. Today   SUBJECTIVE   Overall doing well. No new complaints  VITAL SIGNS:  Blood pressure (!) 156/69, pulse 61, temperature 97.9 F (36.6 C), temperature source Oral, resp. rate 19, height 5\' 3"  (1.6 m), weight 90.2 kg, SpO2 98 %.  I/O:    Intake/Output Summary (Last 24 hours) at 06/28/2019 1430 Last data filed at 06/28/2019 1404 Gross per 24 hour  Intake 455.77 ml  Output 850 ml  Net -394.23 ml    PHYSICAL EXAMINATION:  GENERAL:  68 y.o.-year-old patient lying in the bed with no acute distress.  EYES: Pupils equal, round, reactive to light and accommodation. No scleral icterus. Extraocular muscles intact.  HEENT: Head atraumatic, normocephalic. Oropharynx and nasopharynx clear.  NECK:  Supple, no jugular venous distention. No thyroid  enlargement, no tenderness.  LUNGS: Normal breath sounds bilaterally, no wheezing, rales,rhonchi or crepitation. No use of accessory muscles of respiration.  CARDIOVASCULAR: S1, S2 normal. No murmurs, rubs, or gallops.  ABDOMEN: Soft, non-tender, non-distended. Bowel sounds present. No organomegaly or mass.  EXTREMITIES: No pedal edema, cyanosis, or clubbing.  NEUROLOGIC: Cranial nerves II through XII are intact. Muscle strength 5/5 in all extremities. Sensation intact. Gait not checked.  PSYCHIATRIC: The patient is alert and oriented x 3.  SKIN: No obvious rash, lesion, or ulcer.   DATA REVIEW:   CBC  Recent Labs  Lab 06/27/19 0506  WBC 11.0*  HGB 9.8*  HCT 30.5*  PLT 172    Chemistries  Recent Labs  Lab 06/26/19 1920 06/27/19 0506  NA 136 137  K 4.0 3.6  CL 106 110  CO2 22 22  GLUCOSE 183* 121*  BUN 28* 26*  CREATININE 1.60* 1.20*  CALCIUM 8.5* 7.8*  AST 24  --   ALT 16  --   ALKPHOS 84  --   BILITOT 0.6  --     Microbiology Results   Recent Results (from the past 240 hour(s))  SARS Coronavirus 2 (  Hospital order, Performed in [facility] hospital lab) Nasopharyngeal Nasopharyngeal Swab     Status: None   Collection Time: 06/26/19  7:20 PM   Specimen: Nasopharyngeal Swab  Result Value Ref Range Status   SARS Coronavirus 2 NEGATIVE NEGATIVE Final    Comment: (NOTE) If result is NEGATIVE SARS-CoV-2 target nucleic acids are NOT DETECTED. The SARS-CoV-2 RNA is generally detectable in upper and lower  respiratory specimens during the acute phase of infection. The lowest  concentration of SARS-CoV-2 viral copies this assay can detect is 250  copies / mL. A negative result does not preclude SARS-CoV-2 infection  and should not be used as the sole basis for treatment or other  patient management decisions.  A negative result may occur with  improper specimen collection / handling, submission of specimen other  than nasopharyngeal swab, presence of viral mutation(s)  within the  areas targeted by this assay, and inadequate number of viral copies  (<250 copies / mL). A negative result must be combined with clinical  observations, patient history, and epidemiological information. If result is POSITIVE SARS-CoV-2 target nucleic acids are DETECTED. The SARS-CoV-2 RNA is generally detectable in upper and lower  respiratory specimens dur ing the acute phase of infection.  Positive  results are indicative of active infection with SARS-CoV-2.  Clinical  correlation with patient history and other diagnostic information is  necessary to determine patient infection status.  Positive results do  not rule out bacterial infection or co-infection with other viruses. If result is PRESUMPTIVE POSTIVE SARS-CoV-2 nucleic acids MAY BE PRESENT.   A presumptive positive result was obtained on the submitted specimen  and confirmed on repeat testing.  While 2019 novel coronavirus  (SARS-CoV-2) nucleic acids may be present in the submitted sample  additional confirmatory testing may be necessary for epidemiological  and / or clinical management purposes  to differentiate between  SARS-CoV-2 and other Sarbecovirus currently known to infect humans.  If clinically indicated additional testing with an alternate test  methodology [phone]) is advised. The SARS-CoV-2 RNA is generally  detectable in upper and lower respiratory sp ecimens during the acute  phase of infection. The expected result is Negative. Fact Sheet for Patients:  StrictlyIdeas.no Fact Sheet for Healthcare Providers: BankingDealers.co.za This test is not yet approved or cleared by the [place] FDA and has been authorized for detection and/or diagnosis of SARS-CoV-2 by FDA under an Emergency Use Authorization (EUA).  This EUA will remain in effect (meaning this test can be used) for the duration of the COVID-19 declaration under Section 564(b)(1) of the Act,  21 U.S.C. section 360bbb-3(b)(1), unless the authorization is terminated or revoked sooner. Performed at [facility], [address] [place], [place] [address]   Blood Culture (routine x 2)     Status: None (Preliminary result)   Collection Time: 06/26/19  9:17 PM   Specimen: BLOOD  Result Value Ref Range Status   Specimen Description BLOOD RIGHT FATTY CASTS  Final   Special Requests   Final    BOTTLES DRAWN AEROBIC AND ANAEROBIC Blood Culture results may not be optimal due to an excessive volume of blood received in culture bottles   Culture   Final    NO GROWTH 2 DAYS Performed at [facility], [address] [place], [place] [address]    Report Status PENDING  Incomplete  Culture, blood (Routine X 2) w Reflex to ID Panel     Status: None (Preliminary result)   Collection Time:  06/26/19  9:17 PM   Specimen: BLOOD  Result Value Ref Range Status   Specimen Description BLOOD RIGHT ASSIST CONTROL  Final   Special Requests   Final    BOTTLES DRAWN AEROBIC AND ANAEROBIC Blood Culture results may not be optimal due to an excessive volume of blood received in culture bottles   Culture   Final    NO GROWTH 2 DAYS Performed at [facility], [address] [place], [place] [address]    Report Status PENDING  Incomplete  Urine culture     Status: Abnormal (Preliminary result)   Collection Time: 06/26/19  9:45 PM   Specimen: Urine, Random  Result Value Ref Range Status   Specimen Description   Final    URINE, RANDOM Performed at [facility], [address] [place], [place] [address]    Special Requests   Final    NONE Performed at [facility], [address] [place], [place] [address]    Culture >=100,000 COLONIES/mL GRAM NEGATIVE RODS (A)  Final   Report Status PENDING  Incomplete  Gastrointestinal Panel by PCR , Stool     Status: None   Collection Time: 06/27/19  6:57 AM   Specimen: STOOL  Result Value Ref Range Status    Campylobacter species NOT DETECTED NOT DETECTED Final   Plesimonas shigelloides NOT DETECTED NOT DETECTED Final   Salmonella species NOT DETECTED NOT DETECTED Final   Yersinia enterocolitica NOT DETECTED NOT DETECTED Final   Vibrio species NOT DETECTED NOT DETECTED Final   Vibrio cholerae NOT DETECTED NOT DETECTED Final   Enteroaggregative E coli (EAEC) NOT DETECTED NOT DETECTED Final   Enteropathogenic E coli (EPEC) NOT DETECTED NOT DETECTED Final   Enterotoxigenic E coli (ETEC) NOT DETECTED NOT DETECTED Final   Shiga like toxin producing E coli (STEC) NOT DETECTED NOT DETECTED Final   Shigella/Enteroinvasive E coli (EIEC) NOT DETECTED NOT DETECTED Final   Cryptosporidium NOT DETECTED NOT DETECTED Final   Cyclospora cayetanensis NOT DETECTED NOT DETECTED Final   Entamoeba histolytica NOT DETECTED NOT DETECTED Final   Giardia lamblia NOT DETECTED NOT DETECTED Final   Adenovirus F40/41 NOT DETECTED NOT DETECTED Final   Astrovirus NOT DETECTED NOT DETECTED Final   Norovirus GI/GII NOT DETECTED NOT DETECTED Final   Rotavirus A NOT DETECTED NOT DETECTED Final   Sapovirus (I, II, IV, and V) NOT DETECTED NOT DETECTED Final    Comment: Performed at [facility], [address] [place], [place] [address]  C Difficile Quick Screen w PCR reflex     Status: None   Collection Time: 06/27/19  6:57 AM   Specimen: STOOL  Result Value Ref Range Status   C Diff antigen NEGATIVE NEGATIVE Final   C Diff toxin NEGATIVE NEGATIVE Final   C Diff interpretation No C. difficile detected.  Final    Comment: Performed at [facility], [address] [place], [place] [address]    RADIOLOGY:  Dg Chest Port 1 View  Result Date: 06/26/2019 CLINICAL DATA:  Weakness and fever. EXAM: PORTABLE CHEST 1 VIEW COMPARISON:  January 22, 2017 FINDINGS: Cardiomediastinal silhouette is normal. Mediastinal contours appear intact. Calcific atherosclerotic disease of the aorta. There is no evidence of focal  airspace consolidation, pleural effusion or pneumothorax. Osseous structures are without acute abnormality. Soft tissues are grossly normal. IMPRESSION: No active disease. Electronically Signed   By: Fidela Salisbury M.D.   On: 06/26/2019 19:41     CODE STATUS:  Code Status Orders  (From admission, onward)         Start     Ordered   06/26/19 2327  Full code  Continuous     06/26/19 2326        Code Status History    Date Active Date Inactive Code Status Order ID Comments User Context   05/10/2018 0614 05/11/2018 1817 Full Code [id number]  Harrie Foreman, MD Inpatient   01/22/2017 1650 02/01/2017 1753 Full Code [id number]  Idelle Crouch, MD Inpatient   09/23/2015 1716 09/25/2015 2048 Full Code [id number]  Bonner Puna, MD Inpatient   Advance Care Planning Activity      TOTAL TIME TAKING CARE OF THIS PATIENT: *40* minutes.    Fritzi Mandes M.D on 06/28/2019 at 2:30 PM  Between 7am to 6pm - Pager - [phone] After 6pm go to [email or web address] - password [email or web address] [facility]  Office  [phone]  CC: Primary care physician; Elizabeth Merles, MD

## 2019-06-29 LAB — URINE CULTURE: Culture: >=100000 — AB

## 2019-06-29 NOTE — TOC Transition Note (Signed)
Transition of Care [facility]) - CM/SW Discharge Note   Patient Details  Name: PEARLENE HICKMOTT MRN: [id number] Date of Birth: 17-Jul-1951  Transition of [facility]) CM/SW Contact:  Beverly Sessions, RN Phone Number: 06/29/2019, 9:56 AM   Clinical Narrative:     Late entry Patient discharged on 06/28/19  Patient states that she lives at home with her husband  Patient states that at her baseline she is independent and drives  PCP Dr Lennox Grumbles at [facility] mail scripts  PT has assessed patient and recommends home health.  Patient declines services at discharge States "I dont think that is something that I need"  MD notified.  Patient states that she has a RW and shower seat at home        Patient Goals and CMS Choice        Discharge Placement                       Discharge Plan and Services                                     Social Determinants of Health (SDOH) Interventions     Readmission Risk Interventions No flowsheet data found.

## 2019-07-01 LAB — CULTURE, BLOOD (ROUTINE X 2)
Culture: NO GROWTH
Culture: NO GROWTH

## 2019-11-12 ENCOUNTER — Other Ambulatory Visit: Payer: Self-pay | Admitting: Oncology

## 2019-11-12 DIAGNOSIS — Z1231 Encounter for screening mammogram for malignant neoplasm of breast: Secondary | ICD-10-CM

## 2019-12-27 ENCOUNTER — Ambulatory Visit: Payer: Medicare HMO | Admitting: Oncology

## 2020-06-02 ENCOUNTER — Inpatient Hospital Stay: Admission: RE | Admit: 2020-06-02 | Payer: Medicare HMO | Source: Ambulatory Visit

## 2020-06-02 ENCOUNTER — Other Ambulatory Visit: Payer: Medicare HMO

## 2020-06-20 NOTE — Progress Notes (Deleted)
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  INTERVAL HISTORY: Patient returns to clinic today for routine 47-monthevaluation.  She currently feels well and is asymptomatic. She has no neurologic complaints. She denies any recent fevers or illnesses. She has a good appetite and denies weight loss.  She denies any chest pain, shortness of breath, cough, or hemoptysis.  She has no nausea, vomiting, constipation, or diarrhea. She has no urinary complaints.  Patient feels at her baseline offers no specific complaints today.  REVIEW OF SYSTEMS:   Review of Systems  Constitutional: Negative.  Negative for fever, malaise/fatigue and weight loss.  Respiratory: Negative.  Negative for cough and shortness of breath.   Cardiovascular: Negative.  Negative for chest pain and leg swelling.  Gastrointestinal: Negative.  Negative for abdominal pain and constipation.  Genitourinary: Negative.  Negative for dysuria.  Musculoskeletal: Negative.  Negative for myalgias.  Skin: Negative.  Negative for rash.  Neurological: Negative.  Negative for sensory change, focal weakness, weakness and headaches.  Psychiatric/Behavioral: Negative.  The patient is not nervous/anxious.     As per HPI. Otherwise, a complete review of systems is negative.   PAST MEDICAL HISTORY: Past Medical History:  Diagnosis Date  . Cancer ([facility]    breast  . Carcinoma of right breast ([facility] 05/20/2014  . [place] without [place] ([facility] [place]   . Hypertension   . Lymphedema   . Personal history of radiation therapy 2015   Right breast  . PONV (postoperative  nausea and vomiting)   . Psoriasis 1990  . [place] [place]   residual left arm weakness  . Varicose veins     PAST SURGICAL HISTORY: Past Surgical History:  Procedure Laterality Date  . AMPUTATION TOE Right 01/25/2017   Procedure: AMPUTATION TOE   ;  Surgeon: MAlbertine Patricia DPM;  Location: [facility] OR[facility];  Service: Podiatry;  Laterality: Right;  . [facility]  . BREAST LUMPECTOMY Right 04-2014   followed by radiation,  no chemo  . BREAST REDUCTION SURGERY  1977  . CHOLECYSTECTOMY N/A 09/23/2015   Procedure: LAPAROSCOPIC CHOLECYSTECTOMY;  Surgeon: JBonner Puna MD;  Location: [facility] OR[facility];  Service: General;  Laterality: N/A;  . DILATION AND CURETTAGE OF UTERUS  2010  . EYE SURGERY Left 2015   cataracts and left eye detached retina repair  . EYE SURGERY Right 2013   cataract with len implant  . GASTRIC ROUX-EN-Y N/A 09/23/2015   Procedure: LAPAROSCOPIC ROUX-EN-Y GASTRIC;  Surgeon: JBonner Puna MD;  Location: [facility] OR[facility];  Service: General;  Laterality: N/A;  . HIATAL HERNIA REPAIR N/A 09/23/2015   Procedure: LAPAROSCOPIC REPAIR OF HIATAL HERNIA;  Surgeon: JBonner Puna MD;  Location: [facility] OR[facility];  Service: General;  Laterality: N/A;  . IRRIGATION AND DEBRIDEMENT FOOT Right 01/25/2017   Procedure: IRRIGATION AND DEBRIDEMENT FOOT;  Surgeon: MAlbertine Patricia DPM;  Location: [facility] OR[facility];  Service: Podiatry;  Laterality: Right;  . IRRIGATION AND DEBRIDEMENT FOOT Right 01/29/2017   Procedure: IRRIGATION AND DEBRIDEMENT FOOT and application of wound vac;  Surgeon: MAlbertine Patricia DPM;  Location: [facility];  Service: Podiatry;  Laterality: Right;  . LOWER EXTREMITY ANGIOGRAPHY Right 01/28/2017   Procedure: Lower Extremity Angiography;  Surgeon: Katha Cabal, MD;  Location: [place] [facility];  Service: Cardiovascular;  Laterality: Right;  . LOWER EXTREMITY INTERVENTION  01/28/2017   Procedure: Lower Extremity Intervention;  Surgeon: Katha Cabal, MD;   Location: [place] [facility];  Service: Cardiovascular;;  . REDUCTION MAMMAPLASTY      FAMILY HISTORY Family History  Problem Relation Age of Onset  . Diabetes Mother   . Cancer Mother   . Breast cancer Mother 48  . Diabetes Father   . Diabetes Sister   . Diabetes Brother     GYNECOLOGIC HISTORY:  No LMP recorded. Patient is postmenopausal.     ADVANCED DIRECTIVES:    HEALTH MAINTENANCE: Social History   Tobacco Use  . Smoking status: Never Smoker  . Smokeless tobacco: Never Used  Substance Use Topics  . Alcohol use: No    Alcohol/week: 0.0 standard drinks  . Drug use: No    No Known Allergies  Current Outpatient Medications  Medication Sig Dispense Refill  . albuterol (PROVENTIL HFA;VENTOLIN HFA) 108 (90 BASE) MCG/ACT inhaler Inhale 2 puffs into the lungs every 6 (six) hours as needed for wheezing or shortness of breath.    . calcium-vitamin D (OSCAL WITH D) 500-200 MG-UNIT tablet Take 2 tablets by mouth daily with breakfast. 180 tablet 3  . clopidogrel (PLAVIX) 75 MG tablet Take 75 mg by mouth.    . gabapentin (NEURONTIN) 300 MG capsule Take 300 mg by mouth 3 (three) times daily.     [name] letrozole (FEMARA) 2.5 MG tablet Take 1 tablet (2.5 mg total) by mouth daily. 90 tablet 3  . linagliptin (TRADJENTA) 5 MG TABS tablet Take 5 mg by mouth daily.    [name] lisinopril (PRINIVIL,ZESTRIL) 20 MG tablet Take 20 mg by mouth daily.    . metFORMIN (GLUCOPHAGE) 1000 MG tablet Take 1,000 mg by mouth daily with breakfast.     . PARoxetine (PAXIL) 40 MG tablet Take 40 mg by mouth daily.    . potassium chloride (MICRO-K) 10 MEQ CR capsule Take 10 mEq by mouth daily.     . pravastatin (PRAVACHOL) 40 MG tablet Take 40 mg by mouth daily. In afternoon    . TRUE METRIX BLOOD GLUCOSE TEST test strip     . TRUEPLUS LANCETS 33G MISC      No current facility-administered medications for this visit.    OBJECTIVE: There were no vitals taken for this visit.   There is no height or weight on  file to calculate BMI.    ECOG FS:0 - Asymptomatic  General: Well-developed, well-nourished, no acute distress. Eyes: Pink conjunctiva, anicteric sclera. HEENT: Normocephalic, moist mucous membranes. Breast: Bilateral breast and axilla without lumps or masses. Lungs: Clear to auscultation bilaterally. Heart: Regular rate and rhythm. No rubs, murmurs, or gallops. Abdomen: Soft, nontender, nondistended. No organomegaly noted, normoactive bowel sounds. Musculoskeletal: No edema, cyanosis, or clubbing. Neuro: Alert, answering all questions appropriately. Cranial nerves grossly intact. Skin: No rashes or petechiae noted. Psych: Normal affect.  LAB RESULTS:  No visits with results within 3 Day(s) from this visit.  Latest known visit with results is:  Admission on 06/26/2019, Discharged on 06/28/2019  Component Date Value Ref Range Status  . Lactic Acid, Venous 06/26/2019 2.0* 0.5 - 1.9 mmol/L Final   Comment: CRITICAL RESULT CALLED TO, READ BACK BY AND VERIFIED WITH ALICIA GRANGER '@2014'$  06/26/19 MJU Performed at [facility], [address] [place], [place] [address]   . Lactic Acid, Venous 06/26/2019 1.6  0.5 - 1.9 mmol/L Final   Performed at [facility], [address] [place], [place] [address]  . Sodium 06/26/2019 136  135 - 145 mmol/L Final  . Potassium 06/26/2019 4.0  3.5 - 5.1 mmol/L Final  . Chloride 06/26/2019 106  98 - 111 mmol/L Final  . CO2 06/26/2019 22  22 - 32 mmol/L Final  . Glucose, Bld 06/26/2019 183* 70 - 99 mg/dL Final  . BUN 06/26/2019 28* 8 - 23 mg/dL Final  . Creatinine, Ser 06/26/2019 1.60* 0.44 - 1.00 mg/dL Final  . Calcium 06/26/2019 8.5* 8.9 - 10.3 mg/dL Final  . Total Protein 06/26/2019 6.2* 6.5 - 8.1 g/dL Final  . Albumin 06/26/2019 3.2* 3.5 - 5.0 g/dL Final  . AST 06/26/2019 24  15 - 41 U/L Final  . ALT 06/26/2019 16  0 - 44 U/L Final  . Alkaline Phosphatase 06/26/2019 84  38 - 126 U/L Final  . Total Bilirubin 06/26/2019 0.6  0.3 -  1.2 mg/dL Final  . GFR calc non Af Amer 06/26/2019 33* >60 mL/min Final  . GFR calc Af Amer 06/26/2019 38* >60 mL/min Final  . Anion gap 06/26/2019 8  5 - 15 Final   Performed at [facility], [address] [place], [place] [address]  . Lipase 06/26/2019 17  11 - 51 U/L Final   Performed at [facility], [address] [place], [place] [address]  . WBC 06/26/2019 13.9* 4.0 - 10.5 K/uL Final  . RBC 06/26/2019 3.91  3.87 - 5.11 MIL/uL Final  . Hemoglobin 06/26/2019 11.3* 12.0 - 15.0 g/dL Final  . HCT 06/26/2019 34.7* 36 - 46 % Final  . MCV 06/26/2019 88.7  80.0 - 100.0 fL Final  . MCH 06/26/2019 28.9  26.0 - 34.0 pg Final  . MCHC 06/26/2019 32.6  30.0 - 36.0 g/dL Final  . RDW 06/26/2019 12.7  11.5 - 15.5 % Final  . Platelets 06/26/2019 210  150 - 400 K/uL Final  . nRBC 06/26/2019 0.0  0.0 - 0.2 % Final  . Neutrophils Relative % 06/26/2019 86  % Final  . Neutro Abs 06/26/2019 12.0* 1.7 - 7.7 K/uL Final  . Lymphocytes Relative 06/26/2019 7  % Final  . Lymphs Abs 06/26/2019 1.0  0.7 - 4.0 K/uL Final  . Monocytes Relative 06/26/2019 5  % Final  . Monocytes Absolute 06/26/2019 0.8  0 - 1 K/uL Final  . Eosinophils Relative 06/26/2019 0  % Final  . Eosinophils Absolute 06/26/2019 0.0  0 - 0 K/uL Final  . Basophils Relative 06/26/2019 1  % Final  . Basophils Absolute 06/26/2019 0.1  0 - 0 K/uL Final  . Immature Granulocytes 06/26/2019 1  % Final  . Abs Immature Granulocytes 06/26/2019 0.09* 0.00 - 0.07 K/uL Final   Performed at [facility], [address] [place],  [address]  . Procalcitonin 06/26/2019 0.23  ng/mL Final   Comment:        Interpretation: PCT (Procalcitonin) <= 0.5 ng/mL: Systemic infection (sepsis) is not likely. Local bacterial infection is possible. (NOTE)       Sepsis PCT Algorithm           Lower Respiratory Tract  Infection PCT Algorithm    ----------------------------      ----------------------------         PCT < 0.25 ng/mL                PCT < 0.10 ng/mL         Strongly encourage             Strongly discourage   discontinuation of antibiotics    initiation of antibiotics    ----------------------------     -----------------------------       PCT 0.25 - 0.50 ng/mL            PCT 0.10 - 0.25 ng/mL               OR       >80% decrease in PCT            Discourage initiation of                                            antibiotics      Encourage discontinuation           of antibiotics    ----------------------------     -----------------------------         PCT >= 0.50 ng/mL              PCT 0.26 - 0.50 ng/mL               AND                                 <80% decrease in PCT             Encourage initiation of                                             antibiotics       Encourage continuation           of antibiotics    ----------------------------     -----------------------------        PCT >= 0.50 ng/mL                  PCT > 0.50 ng/mL               AND         increase in PCT                  Strongly encourage                                      initiation of antibiotics    Strongly encourage escalation           of antibiotics                                     -----------------------------  PCT <= 0.25 ng/mL                                                 OR                                        > 80% decrease in PCT                                     Discontinue / Do not initiate                                             antibiotics Performed at [facility], [address] [place], [place] [address]   . Specimen Description 06/26/2019 BLOOD RIGHT FATTY CASTS   Final  . Special Requests 06/26/2019 BOTTLES DRAWN AEROBIC AND ANAEROBIC Blood Culture results may not be optimal due to an excessive volume of blood received in culture bottles   Final  . Culture 06/26/2019    Final                     Value:NO GROWTH 5 DAYS Performed at [facility], [address], [place], [place] [address]   . Report Status 06/26/2019 07/01/2019 FINAL   Final  . Color, Urine 06/26/2019 YELLOW* YELLOW Final  . APPearance 06/26/2019 HAZY* CLEAR Final  . Specific Gravity, Urine 06/26/2019 1.014  1.005 - 1.030 Final  . pH 06/26/2019 5.0  5.0 - 8.0 Final  . Glucose, UA 06/26/2019 NEGATIVE  NEGATIVE mg/dL Final  . Hgb urine dipstick 06/26/2019 SMALL* NEGATIVE Final  . Bilirubin Urine 06/26/2019 NEGATIVE  NEGATIVE Final  . Ketones, ur 06/26/2019 NEGATIVE  NEGATIVE mg/dL Final  . Protein, ur 06/26/2019 NEGATIVE  NEGATIVE mg/dL Final  . Nitrite 06/26/2019 NEGATIVE  NEGATIVE Final  . Chalmers Guest 06/26/2019 SMALL* NEGATIVE Final  . RBC / HPF 06/26/2019 0-5  0 - 5 RBC/hpf Final  . WBC, UA 06/26/2019 6-10  0 - 5 WBC/hpf Final  . Bacteria, UA 06/26/2019 MANY* NONE SEEN Final  . Squamous Epithelial / LPF 06/26/2019 0-5  0 - 5 Final  . Mucus 06/26/2019 PRESENT   Final   Performed at [facility], [address], [place], [place] [address]  . Specimen Description 06/26/2019    Final                   Value:URINE, RANDOM Performed at [facility], [address] [place], [place] [address]   . Special Requests 06/26/2019    Final                   Value:NONE Performed at [facility], [address] [place], [place] [address]   . Culture 06/26/2019 >=100,000 COLONIES/mL KLEBSIELLA PNEUMONIAE*  Final  . Report Status 06/26/2019 06/29/2019 FINAL   Final  . Organism ID, Bacteria 06/26/2019 KLEBSIELLA PNEUMONIAE*  Final  . SARS Coronavirus 2 06/26/2019 NEGATIVE  NEGATIVE Final   Comment: (NOTE) If result is NEGATIVE SARS-CoV-2 target nucleic acids are NOT  DETECTED. The SARS-CoV-2 RNA is generally detectable in upper and lower  respiratory specimens during the acute phase of infection. The lowest  concentration of SARS-CoV-2 viral copies this assay  can detect is 250  copies / mL. A negative result does not preclude SARS-CoV-2 infection  and should not be used as the sole basis for treatment or other  patient management decisions.  A negative result may occur with  improper specimen collection / handling, submission of specimen other  than nasopharyngeal swab, presence of viral mutation(s) within the  areas targeted by this assay, and inadequate number of viral copies  (<250 copies / mL). A negative result must be combined with clinical  observations, patient history, and epidemiological information. If result is POSITIVE SARS-CoV-2 target nucleic acids are DETECTED. The SARS-CoV-2 RNA is generally detectable in upper and lower  respiratory specimens dur                          ing the acute phase of infection.  Positive  results are indicative of active infection with SARS-CoV-2.  Clinical  correlation with patient history and other diagnostic information is  necessary to determine patient infection status.  Positive results do  not rule out bacterial infection or co-infection with other viruses. If result is PRESUMPTIVE POSTIVE SARS-CoV-2 nucleic acids MAY BE PRESENT.   A presumptive positive result was obtained on the submitted specimen  and confirmed on repeat testing.  While 2019 novel coronavirus  (SARS-CoV-2) nucleic acids may be present in the submitted sample  additional confirmatory testing may be necessary for epidemiological  and / or clinical management purposes  to differentiate between  SARS-CoV-2 and other Sarbecovirus currently known to infect humans.  If clinically indicated additional testing with an alternate test  methodology [phone]) is advised. The SARS-CoV-2 RNA is generally  detectable in upper and lower respiratory sp                          ecimens during the acute  phase of infection. The expected result is Negative. Fact Sheet for Patients:  StrictlyIdeas.no Fact Sheet for  Healthcare Providers: BankingDealers.co.za This test is not yet approved or cleared by the [place] FDA and has been authorized for detection and/or diagnosis of SARS-CoV-2 by FDA under an Emergency Use Authorization (EUA).  This EUA will remain in effect (meaning this test can be used) for the duration of the COVID-19 declaration under Section 564(b)(1) of the Act, 21 U.S.C. section 360bbb-3(b)(1), unless the authorization is terminated or revoked sooner. Performed at [facility], [address] [place], [place] [address]   . Total CK 06/26/2019 73  38.0 - 234.0 U/L Final   Performed at [facility], [address] [place], [place] [address]  . Specimen Description 06/26/2019 BLOOD RIGHT ASSIST CONTROL   Final  . Special Requests 06/26/2019 BOTTLES DRAWN AEROBIC AND ANAEROBIC Blood Culture results may not be optimal due to an excessive volume of blood received in culture bottles   Final  . Culture 06/26/2019    Final                   Value:NO GROWTH 5 DAYS Performed at [facility], [address], [place], [place] [address]   . Report Status 06/26/2019 07/01/2019 FINAL   Final  . Hgb A1c MFr Bld 06/26/2019 5.3  4.8 - 5.6 % Final   Comment: (NOTE)  Pre diabetes:          5.7%-6.4% Diabetes:              >6.4% Glycemic control for   <7.0% adults with diabetes   . Mean Plasma Glucose 06/26/2019 105.41  mg/dL Final   Performed at [facility] [address] [place], [place] [address]  . Glucose-Capillary 06/26/2019 136* 70 - 99 mg/dL Final  . Prothrombin Time 06/27/2019 15.1  11.4 - 15.2 seconds Final  . INR 06/27/2019 1.2  0.8 - 1.2 Final   Comment: (NOTE) INR goal varies based on device and disease states. Performed at [facility], [address] [place], [place] [address]   . Cortisol - AM 06/27/2019 9.5  6.7 - 22.6 ug/dL Final   Performed at [facility], [place] [address], [place], [place]  [address]  . Procalcitonin 06/27/2019 0.31  ng/mL Final   Comment:        Interpretation: PCT (Procalcitonin) <= 0.5 ng/mL: Systemic infection (sepsis) is not likely. Local bacterial infection is possible. (NOTE)       Sepsis PCT Algorithm           Lower Respiratory Tract                                      Infection PCT Algorithm    ----------------------------     ----------------------------         PCT < 0.25 ng/mL                PCT < 0.10 ng/mL         Strongly encourage             Strongly discourage   discontinuation of antibiotics    initiation of antibiotics    ----------------------------     -----------------------------       PCT 0.25 - 0.50 ng/mL            PCT 0.10 - 0.25 ng/mL               OR       >80% decrease in PCT            Discourage initiation of                                            antibiotics      Encourage discontinuation           of antibiotics    ----------------------------     -----------------------------         PCT >= 0.50 ng/mL              PCT 0.26 - 0.50 ng/mL               AND                                 <80% decrease in PCT             Encourage initiation of  antibiotics       Encourage continuation           of antibiotics    ----------------------------     -----------------------------        PCT >= 0.50 ng/mL                  PCT > 0.50 ng/mL               AND         increase in PCT                  Strongly encourage                                      initiation of antibiotics    Strongly encourage escalation           of antibiotics                                     -----------------------------                                           PCT <= 0.25 ng/mL                                                 OR                                        > 80% decrease in PCT                                     Discontinue / Do not initiate                                              antibiotics Performed at [facility], [address] [place], [place] [address]   . TSH 06/26/2019 0.989  0.350 - 4.500 uIU/mL Final   Comment: Performed by a 3rd Generation assay with a functional sensitivity of <=0.01 uIU/mL. Performed at [facility], [address] [place], [place] [address]   . Sodium 06/27/2019 137  135 - 145 mmol/L Final  . Potassium 06/27/2019 3.6  3.5 - 5.1 mmol/L Final  . Chloride 06/27/2019 110  98 - 111 mmol/L Final  . CO2 06/27/2019 22  22 - 32 mmol/L Final  . Glucose, Bld 06/27/2019 121* 70 - 99 mg/dL Final  . BUN 06/27/2019 26* 8 - 23 mg/dL Final  . Creatinine, Ser 06/27/2019 1.20* 0.44 - 1.00 mg/dL Final  . Calcium 06/27/2019 7.8* 8.9 - 10.3 mg/dL Final  . GFR calc non Af Amer 06/27/2019 47* >60 mL/min Final  . GFR calc Af Amer 06/27/2019 54* >60 mL/min Final  . Anion gap 06/27/2019 5  5 - 15  Final   Performed at [facility], [address] [place], [place] [address]  . WBC 06/27/2019 11.0* 4.0 - 10.5 K/uL Final  . RBC 06/27/2019 3.41* 3.87 - 5.11 MIL/uL Final  . Hemoglobin 06/27/2019 9.8* 12.0 - 15.0 g/dL Final  . HCT 16/01/8029 30.5* 36 - 46 % Final  . MCV 06/27/2019 89.4  80.0 - 100.0 fL Final  . MCH 06/27/2019 28.7  26.0 - 34.0 pg Final  . MCHC 06/27/2019 32.1  30.0 - 36.0 g/dL Final  . RDW 56/54/9967 12.9  11.5 - 15.5 % Final  . Platelets 06/27/2019 172  150 - 400 K/uL Final  . nRBC 06/27/2019 0.0  0.0 - 0.2 % Final   Performed at [facility], [address] [place], [place] [address]  . HIV Screen 4th Generation wRfx 06/27/2019 Non Reactive  Non Reactive Final   Comment: (NOTE) Performed At: [facility] [address] [place], [place] [id number] Jolene Schimke MD GE:[id number]   . Campylobacter species 06/27/2019 NOT DETECTED  NOT DETECTED Final  . Plesimonas shigelloides 06/27/2019 NOT DETECTED  NOT DETECTED Final  . Salmonella species 06/27/2019 NOT DETECTED  NOT DETECTED Final  .  Yersinia enterocolitica 06/27/2019 NOT DETECTED  NOT DETECTED Final  . Vibrio species 06/27/2019 NOT DETECTED  NOT DETECTED Final  . Vibrio cholerae 06/27/2019 NOT DETECTED  NOT DETECTED Final  . Enteroaggregative E coli (EAEC) 06/27/2019 NOT DETECTED  NOT DETECTED Final  . Enteropathogenic E coli (EPEC) 06/27/2019 NOT DETECTED  NOT DETECTED Final  . Enterotoxigenic E coli (ETEC) 06/27/2019 NOT DETECTED  NOT DETECTED Final  . Shiga like toxin producing E coli * 06/27/2019 NOT DETECTED  NOT DETECTED Final  . Shigella/Enteroinvasive E coli (EI* 06/27/2019 NOT DETECTED  NOT DETECTED Final  . Cryptosporidium 06/27/2019 NOT DETECTED  NOT DETECTED Final  . Cyclospora cayetanensis 06/27/2019 NOT DETECTED  NOT DETECTED Final  . Entamoeba histolytica 06/27/2019 NOT DETECTED  NOT DETECTED Final  . Giardia lamblia 06/27/2019 NOT DETECTED  NOT DETECTED Final  . Adenovirus F40/41 06/27/2019 NOT DETECTED  NOT DETECTED Final  . Astrovirus 06/27/2019 NOT DETECTED  NOT DETECTED Final  . Norovirus GI/GII 06/27/2019 NOT DETECTED  NOT DETECTED Final  . Rotavirus A 06/27/2019 NOT DETECTED  NOT DETECTED Final  . Sapovirus (I, II, IV, and V) 06/27/2019 NOT DETECTED  NOT DETECTED Final   Performed at [facility], [address] [place], [place] [address]  . C Diff antigen 06/27/2019 NEGATIVE  NEGATIVE Final  . C Diff toxin 06/27/2019 NEGATIVE  NEGATIVE Final  . C Diff interpretation 06/27/2019 No C. difficile detected.   Final   Performed at [facility], [address], [place], [place] [address]  . Glucose-Capillary 06/27/2019 93  70 - 99 mg/dL Final  . Glucose-Capillary 06/27/2019 141* 70 - 99 mg/dL Final  . Glucose-Capillary 06/27/2019 126* 70 - 99 mg/dL Final  . Glucose-Capillary 06/27/2019 107* 70 - 99 mg/dL Final  . Glucose-Capillary 06/28/2019 104* 70 - 99 mg/dL Final  . Glucose-Capillary 06/28/2019 108* 70 - 99 mg/dL Final  . Glucose-Capillary 06/28/2019 102* 70 - 99 mg/dL Final     STUDIES: No results found.  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  PLAN:    1. 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  She has now completed 5 years of letrozole and has been instructed to discontinue treatment after she completes her current prescription.  Her most recent mammogram on May 31, 2019 was reported as BI-RADS 2.  Repeat in July 2021.  Return to clinic in 1 year for routine evaluation. 2.  Osteopenia: Patient's most recent bone mineral density on May 31, 2019 reported a T score of -1.3.  This is worse than 2 years prior where the T score was reported as -0.1.  Continue calcium and vitamin D supplementation.  Repeat bone marrow density in July 2021.     Patient expressed understanding and was in agreement with this plan. She also understands that She can call clinic at any time with any questions, concerns, or complaints.    Lloyd Huger, MD   06/20/2020 8:28 AM

## 2020-06-25 ENCOUNTER — Ambulatory Visit
Admission: RE | Admit: 2020-06-25 | Discharge: 2020-06-25 | Disposition: A | Discharge status: Home / Self Care | Payer: Medicare HMO | Source: Ambulatory Visit | Attending: Oncology | Admitting: Oncology

## 2020-06-25 ENCOUNTER — Other Ambulatory Visit: Payer: Self-pay

## 2020-06-25 DIAGNOSIS — Z853 Personal history of malignant neoplasm of breast: Secondary | ICD-10-CM | POA: Diagnosis not present

## 2020-06-25 DIAGNOSIS — Z1231 Encounter for screening mammogram for malignant neoplasm of breast: Secondary | ICD-10-CM | POA: Diagnosis not present

## 2020-06-25 DIAGNOSIS — Z13820 Encounter for screening for osteoporosis: Secondary | ICD-10-CM | POA: Diagnosis not present

## 2020-06-25 DIAGNOSIS — Z923 Personal history of irradiation: Secondary | ICD-10-CM | POA: Insufficient documentation

## 2020-06-25 DIAGNOSIS — Z780 Asymptomatic menopausal state: Secondary | ICD-10-CM | POA: Diagnosis not present

## 2020-06-25 DIAGNOSIS — C50511 Malignant neoplasm of lower-outer quadrant of right female breast: Secondary | ICD-10-CM

## 2020-06-25 IMAGING — MG DIGITAL SCREENING BILAT W/ TOMO W/ CAD
8 of 15 series · 8 of 40 positions shown · non-contrast
Comparison: Previous exam(s).

CLINICAL DATA: Screening. History of right breast cancer, diagnosed
in [ML].

EXAM:
DIGITAL SCREENING BILATERAL MAMMOGRAM WITH TOMO AND CAD

[L CC synth-2D]
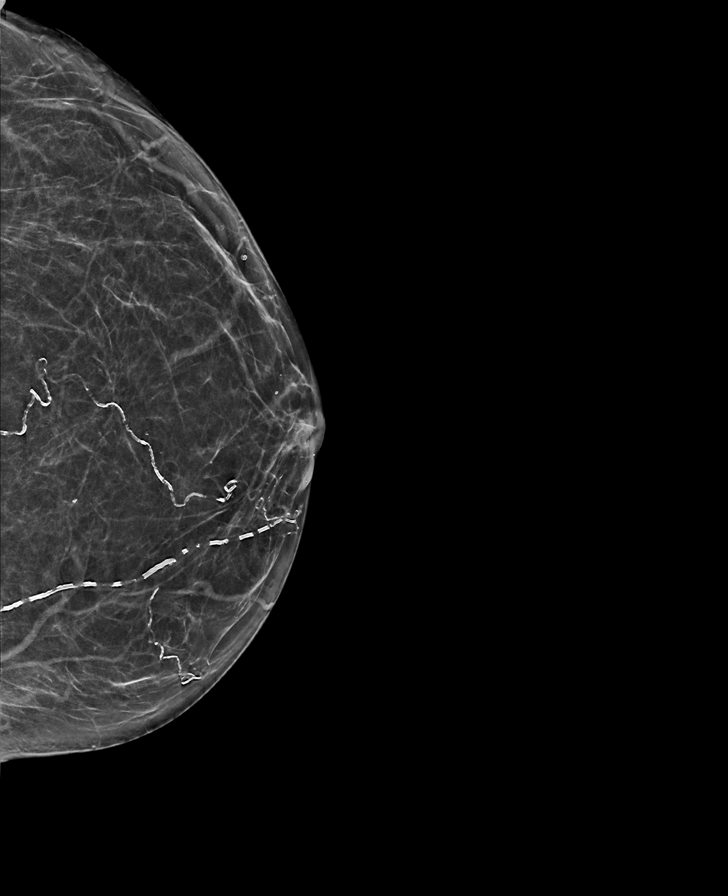

[R CC synth-2D (1 of 2)]
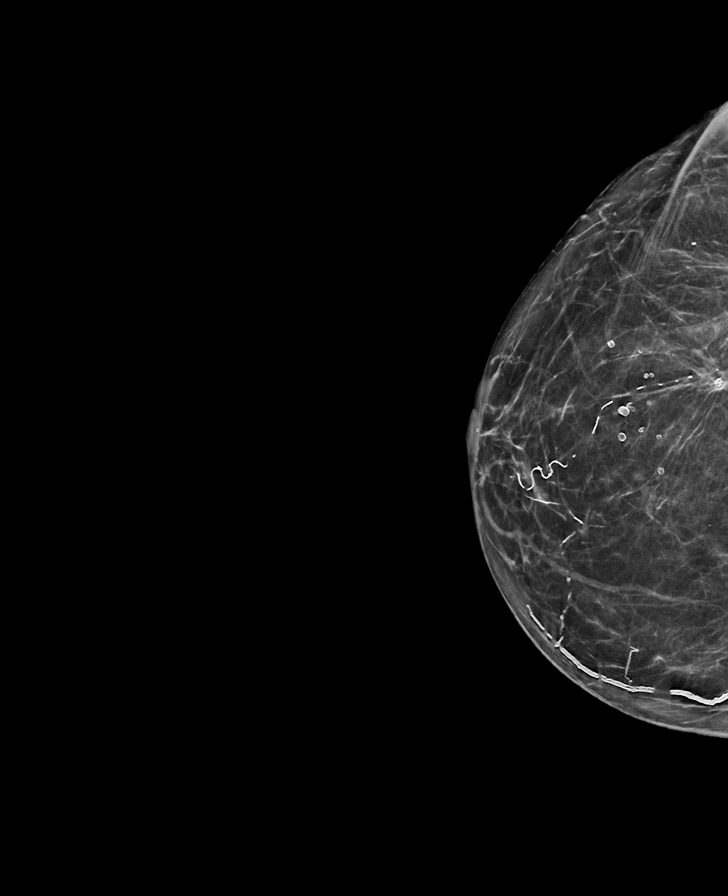

[L MLO synth-2D (1 of 2)]
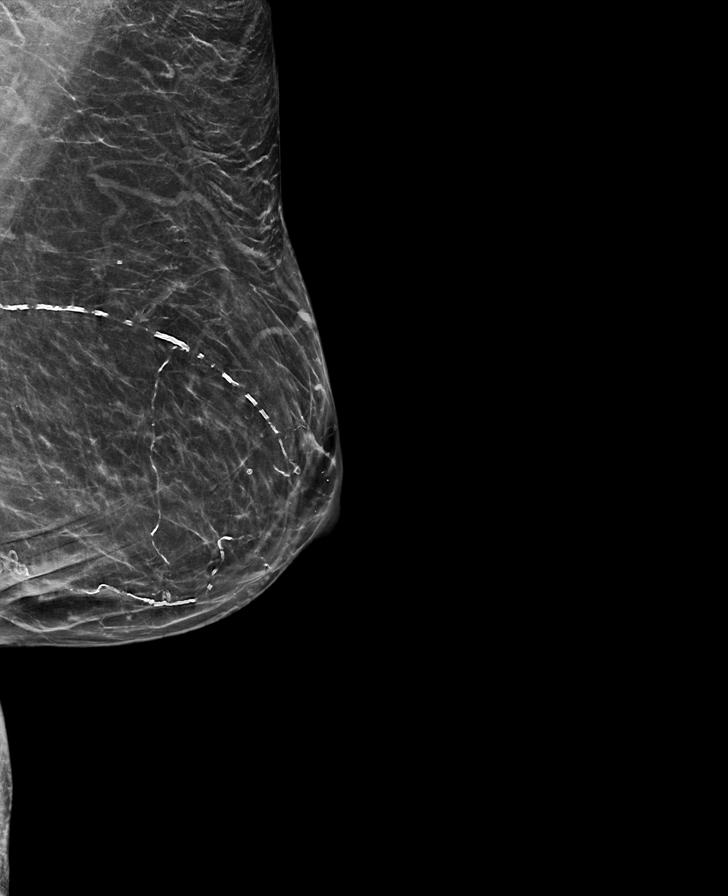

[R MLO synth-2D (1 of 2)]
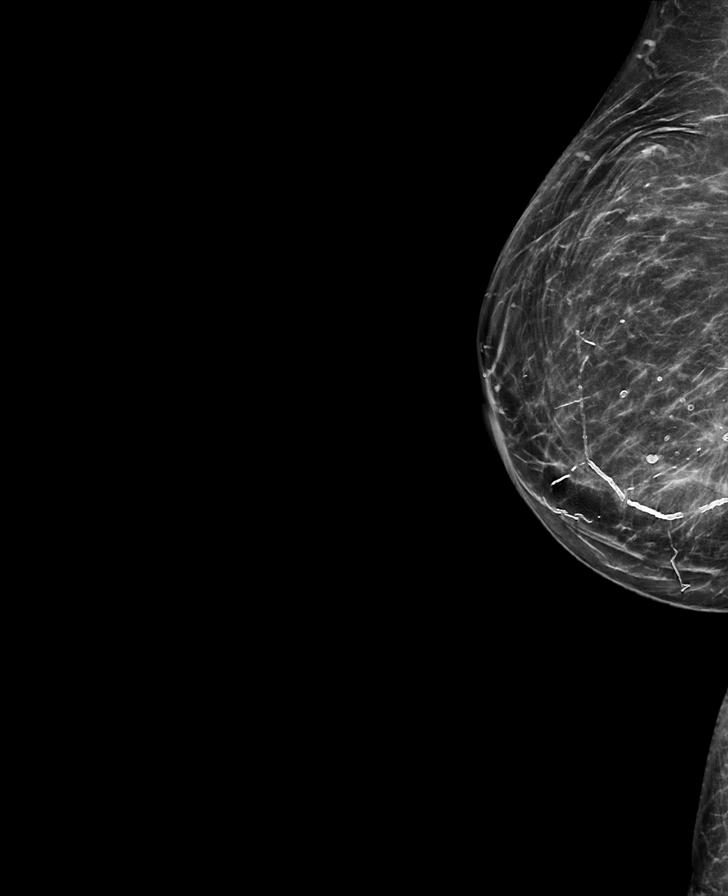

[R MLO synth-2D (2 of 2)]
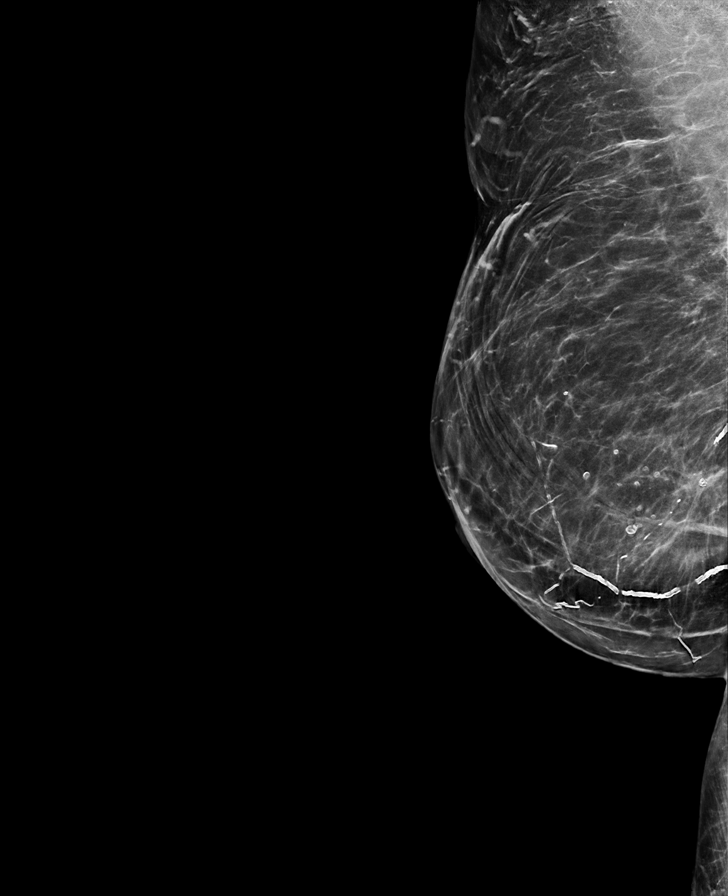

[L MLO synth-2D (2 of 2)]
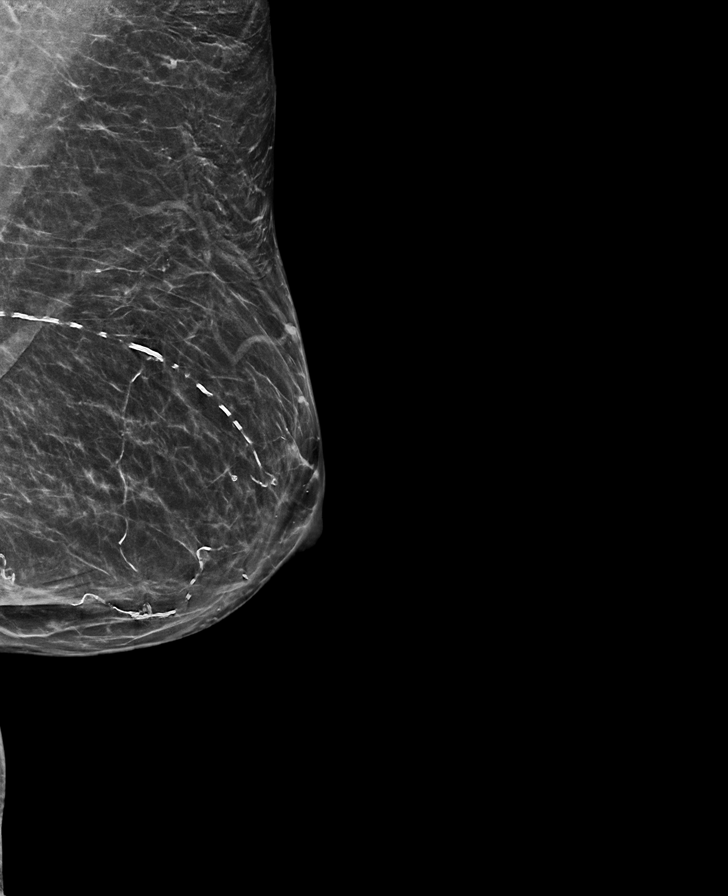

[R CC synth-2D (2 of 2)]
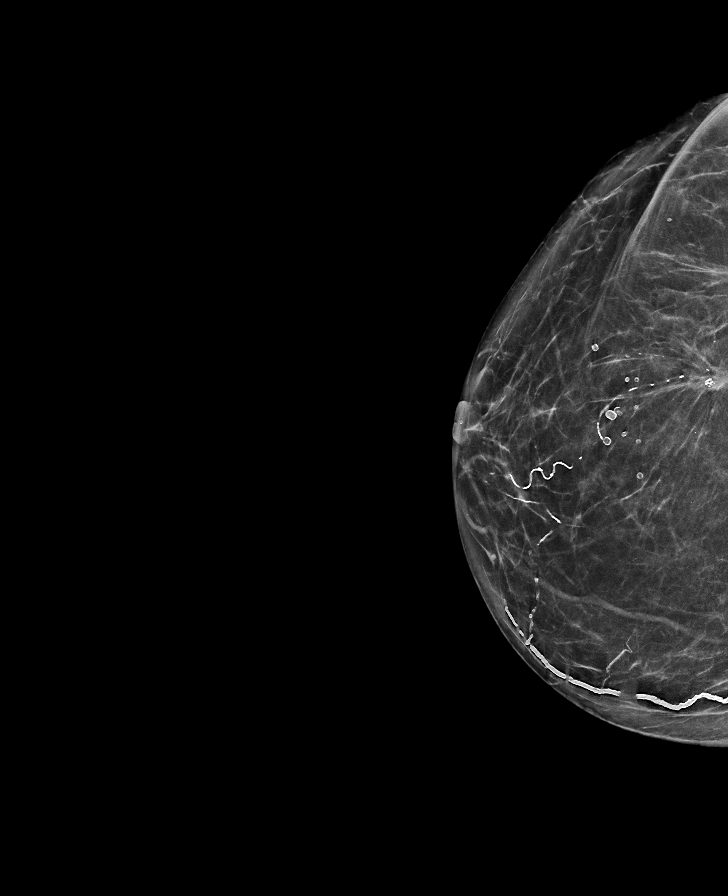

[R MLO tomo · tomo slice 51/74.0]
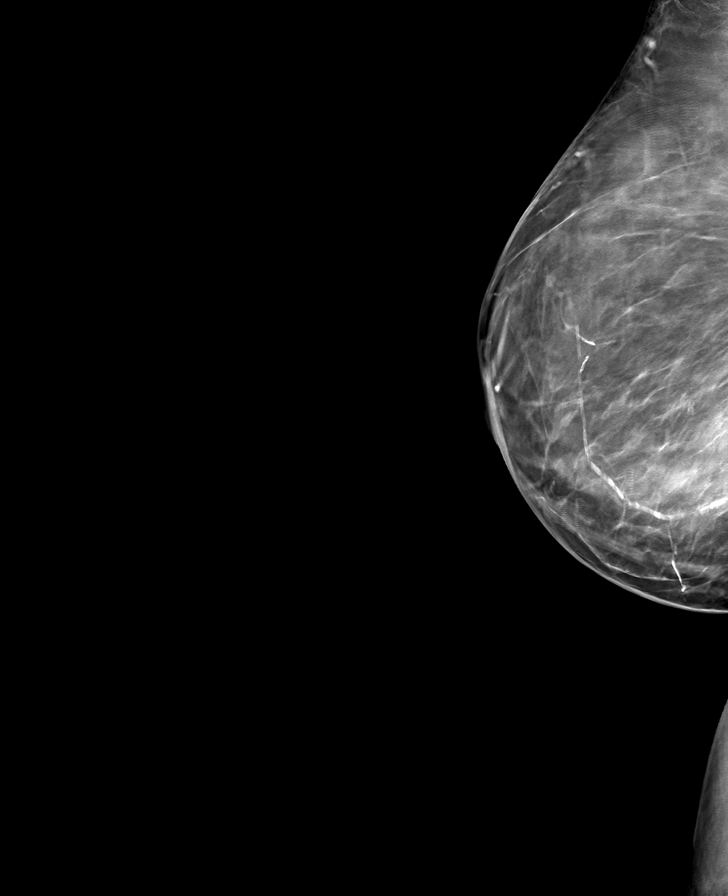

[8 of 40 positions shown; findings below may reference images not displayed]

ACR Breast Density Category b: There are scattered areas of
fibroglandular density.
FINDINGS: There are no findings suspicious for malignancy. Images were
processed with CAD.
IMPRESSION: No mammographic evidence of malignancy. A result letter of this
screening mammogram will be mailed directly to the patient.

RECOMMENDATION:
Screening mammogram in one year. (Code:[ML])

BI-RADS CATEGORY  1: Negative.

## 2020-06-26 ENCOUNTER — Inpatient Hospital Stay: Payer: Medicare HMO | Admitting: Oncology

## 2021-03-03 ENCOUNTER — Emergency Department: Payer: Medicare HMO

## 2021-03-03 ENCOUNTER — Inpatient Hospital Stay: Payer: Medicare HMO

## 2021-03-03 ENCOUNTER — Other Ambulatory Visit: Payer: Self-pay

## 2021-03-03 ENCOUNTER — Inpatient Hospital Stay
Admission: EM | Admit: 2021-03-03 | Discharge: 2021-03-07 | DRG: 617 | Disposition: A | Discharge status: Home / Self Care | Payer: Medicare HMO | Attending: Internal Medicine | Admitting: Internal Medicine

## 2021-03-03 DIAGNOSIS — E785 Hyperlipidemia, unspecified: Secondary | ICD-10-CM | POA: Diagnosis present

## 2021-03-03 DIAGNOSIS — E876 Hypokalemia: Secondary | ICD-10-CM | POA: Diagnosis not present

## 2021-03-03 DIAGNOSIS — Z821 Family history of blindness and visual loss: Secondary | ICD-10-CM

## 2021-03-03 DIAGNOSIS — L97829 Non-pressure chronic ulcer of other part of left lower leg with unspecified severity: Secondary | ICD-10-CM | POA: Diagnosis present

## 2021-03-03 DIAGNOSIS — I69334 Monoplegia of upper limb following cerebral infarction affecting left non-dominant side: Secondary | ICD-10-CM

## 2021-03-03 DIAGNOSIS — Z89421 Acquired absence of other right toe(s): Secondary | ICD-10-CM

## 2021-03-03 DIAGNOSIS — L03116 Cellulitis of left lower limb: Secondary | ICD-10-CM | POA: Diagnosis present

## 2021-03-03 DIAGNOSIS — M869 Osteomyelitis, unspecified: Secondary | ICD-10-CM | POA: Diagnosis not present

## 2021-03-03 DIAGNOSIS — Z7984 Long term (current) use of oral hypoglycemic drugs: Secondary | ICD-10-CM

## 2021-03-03 DIAGNOSIS — E871 Hypo-osmolality and hyponatremia: Secondary | ICD-10-CM | POA: Diagnosis present

## 2021-03-03 DIAGNOSIS — E11628 Type 2 diabetes mellitus with other skin complications: Secondary | ICD-10-CM | POA: Diagnosis present

## 2021-03-03 DIAGNOSIS — Z9884 Bariatric surgery status: Secondary | ICD-10-CM

## 2021-03-03 DIAGNOSIS — Z833 Family history of diabetes mellitus: Secondary | ICD-10-CM | POA: Diagnosis not present

## 2021-03-03 DIAGNOSIS — E1142 Type 2 diabetes mellitus with diabetic polyneuropathy: Secondary | ICD-10-CM | POA: Diagnosis present

## 2021-03-03 DIAGNOSIS — L0390 Cellulitis, unspecified: Secondary | ICD-10-CM

## 2021-03-03 DIAGNOSIS — Z20822 Contact with and (suspected) exposure to covid-19: Secondary | ICD-10-CM | POA: Diagnosis present

## 2021-03-03 DIAGNOSIS — D72829 Elevated white blood cell count, unspecified: Secondary | ICD-10-CM | POA: Diagnosis not present

## 2021-03-03 DIAGNOSIS — N189 Chronic kidney disease, unspecified: Secondary | ICD-10-CM | POA: Diagnosis not present

## 2021-03-03 DIAGNOSIS — I890 Lymphedema, not elsewhere classified: Secondary | ICD-10-CM | POA: Diagnosis present

## 2021-03-03 DIAGNOSIS — N1831 Chronic kidney disease, stage 3a: Secondary | ICD-10-CM | POA: Diagnosis present

## 2021-03-03 DIAGNOSIS — M79673 Pain in unspecified foot: Secondary | ICD-10-CM | POA: Diagnosis present

## 2021-03-03 DIAGNOSIS — L409 Psoriasis, unspecified: Secondary | ICD-10-CM | POA: Diagnosis present

## 2021-03-03 DIAGNOSIS — E11621 Type 2 diabetes mellitus with foot ulcer: Secondary | ICD-10-CM | POA: Diagnosis present

## 2021-03-03 DIAGNOSIS — Z7902 Long term (current) use of antithrombotics/antiplatelets: Secondary | ICD-10-CM

## 2021-03-03 DIAGNOSIS — Z923 Personal history of irradiation: Secondary | ICD-10-CM

## 2021-03-03 DIAGNOSIS — Z853 Personal history of malignant neoplasm of breast: Secondary | ICD-10-CM | POA: Diagnosis not present

## 2021-03-03 DIAGNOSIS — E1152 Type 2 diabetes mellitus with diabetic peripheral angiopathy with gangrene: Secondary | ICD-10-CM | POA: Diagnosis present

## 2021-03-03 DIAGNOSIS — I129 Hypertensive chronic kidney disease with stage 1 through stage 4 chronic kidney disease, or unspecified chronic kidney disease: Secondary | ICD-10-CM | POA: Diagnosis present

## 2021-03-03 DIAGNOSIS — E872 Acidosis, unspecified: Secondary | ICD-10-CM

## 2021-03-03 DIAGNOSIS — Z79899 Other long term (current) drug therapy: Secondary | ICD-10-CM

## 2021-03-03 DIAGNOSIS — F32A Depression, unspecified: Secondary | ICD-10-CM | POA: Diagnosis present

## 2021-03-03 DIAGNOSIS — Z23 Encounter for immunization: Secondary | ICD-10-CM | POA: Diagnosis present

## 2021-03-03 DIAGNOSIS — L03119 Cellulitis of unspecified part of limb: Secondary | ICD-10-CM

## 2021-03-03 DIAGNOSIS — Z818 Family history of other mental and behavioral disorders: Secondary | ICD-10-CM

## 2021-03-03 DIAGNOSIS — Z9049 Acquired absence of other specified parts of digestive tract: Secondary | ICD-10-CM | POA: Diagnosis not present

## 2021-03-03 DIAGNOSIS — Z825 Family history of asthma and other chronic lower respiratory diseases: Secondary | ICD-10-CM

## 2021-03-03 DIAGNOSIS — Z8249 Family history of ischemic heart disease and other diseases of the circulatory system: Secondary | ICD-10-CM

## 2021-03-03 DIAGNOSIS — L97529 Non-pressure chronic ulcer of other part of left foot with unspecified severity: Secondary | ICD-10-CM

## 2021-03-03 DIAGNOSIS — I70245 Atherosclerosis of native arteries of left leg with ulceration of other part of foot: Secondary | ICD-10-CM | POA: Diagnosis not present

## 2021-03-03 DIAGNOSIS — I8390 Asymptomatic varicose veins of unspecified lower extremity: Secondary | ICD-10-CM | POA: Diagnosis present

## 2021-03-03 DIAGNOSIS — E1122 Type 2 diabetes mellitus with diabetic chronic kidney disease: Secondary | ICD-10-CM | POA: Diagnosis present

## 2021-03-03 DIAGNOSIS — D631 Anemia in chronic kidney disease: Secondary | ICD-10-CM | POA: Diagnosis present

## 2021-03-03 DIAGNOSIS — R700 Elevated erythrocyte sedimentation rate: Secondary | ICD-10-CM

## 2021-03-03 DIAGNOSIS — N179 Acute kidney failure, unspecified: Secondary | ICD-10-CM | POA: Diagnosis present

## 2021-03-03 DIAGNOSIS — I491 Atrial premature depolarization: Secondary | ICD-10-CM | POA: Diagnosis present

## 2021-03-03 DIAGNOSIS — Z79811 Long term (current) use of aromatase inhibitors: Secondary | ICD-10-CM

## 2021-03-03 DIAGNOSIS — Z803 Family history of malignant neoplasm of breast: Secondary | ICD-10-CM

## 2021-03-03 DIAGNOSIS — M7989 Other specified soft tissue disorders: Secondary | ICD-10-CM | POA: Diagnosis present

## 2021-03-03 LAB — RESP PANEL BY RT-PCR (FLU A&B, COVID) ARPGX2
Influenza A by PCR: NEGATIVE
Influenza B by PCR: NEGATIVE
SARS Coronavirus 2 by RT PCR: NEGATIVE

## 2021-03-03 LAB — COMPREHENSIVE METABOLIC PANEL
ALT: 10 U/L (ref 0–44)
AST: 14 U/L — ABNORMAL LOW (ref 15–41)
Albumin: 3.2 g/dL — ABNORMAL LOW (ref 3.5–5.0)
Alkaline Phosphatase: 73 U/L (ref 38–126)
Anion gap: 12 (ref 5–15)
BUN: 27 mg/dL — ABNORMAL HIGH (ref 8–23)
CO2: 22 mmol/L (ref 22–32)
Calcium: 8.5 mg/dL — ABNORMAL LOW (ref 8.9–10.3)
Chloride: 100 mmol/L (ref 98–111)
Creatinine, Ser: 1.59 mg/dL — ABNORMAL HIGH (ref 0.44–1.00)
GFR, Estimated: 35 mL/min — ABNORMAL LOW (ref >60)
Glucose, Bld: 145 mg/dL — ABNORMAL HIGH (ref 70–99)
Potassium: 3.4 mmol/L — ABNORMAL LOW (ref 3.5–5.1)
Sodium: 134 mmol/L — ABNORMAL LOW (ref 135–145)
Total Bilirubin: 0.9 mg/dL (ref 0.3–1.2)
Total Protein: 6.7 g/dL (ref 6.5–8.1)

## 2021-03-03 LAB — PROCALCITONIN: Procalcitonin: 0.1 ng/mL

## 2021-03-03 LAB — CBC WITH DIFFERENTIAL/PLATELET
Abs Immature Granulocytes: 0.07 10*3/uL (ref 0.00–0.07)
Basophils Absolute: 0.1 10*3/uL (ref 0.0–0.1)
Basophils Relative: 0 %
Eosinophils Absolute: 0 10*3/uL (ref 0.0–0.5)
Eosinophils Relative: 0 %
HCT: 33.7 % — ABNORMAL LOW (ref 36.0–46.0)
Hemoglobin: 10.8 g/dL — ABNORMAL LOW (ref 12.0–15.0)
Immature Granulocytes: 0 %
Lymphocytes Relative: 5 %
Lymphs Abs: 1.1 10*3/uL (ref 0.7–4.0)
MCH: 27.3 pg (ref 26.0–34.0)
MCHC: 32 g/dL (ref 30.0–36.0)
MCV: 85.1 fL (ref 80.0–100.0)
Monocytes Absolute: 1.4 10*3/uL — ABNORMAL HIGH (ref 0.1–1.0)
Monocytes Relative: 7 %
Neutro Abs: 16.8 10*3/uL — ABNORMAL HIGH (ref 1.7–7.7)
Neutrophils Relative %: 88 %
Platelets: 202 10*3/uL (ref 150–400)
RBC: 3.96 MIL/uL (ref 3.87–5.11)
RDW: 14.3 % (ref 11.5–15.5)
WBC: 19.3 10*3/uL — ABNORMAL HIGH (ref 4.0–10.5)
nRBC: 0 % (ref 0.0–0.2)

## 2021-03-03 LAB — LACTIC ACID, PLASMA
Lactic Acid, Venous: 2.1 mmol/L (ref 0.5–1.9)
Lactic Acid, Venous: 1.4 mmol/L (ref 0.5–1.9)

## 2021-03-03 LAB — URINALYSIS, COMPLETE (UACMP) WITH MICROSCOPIC
Bilirubin Urine: NEGATIVE
Glucose, UA: NEGATIVE mg/dL
Ketones, ur: NEGATIVE mg/dL
Nitrite: NEGATIVE
Protein, ur: NEGATIVE mg/dL
Specific Gravity, Urine: 1.009 (ref 1.005–1.030)
pH: 5 (ref 5.0–8.0)

## 2021-03-03 LAB — PROTIME-INR
INR: 1.2 (ref 0.8–1.2)
Prothrombin Time: 14.9 seconds (ref 11.4–15.2)

## 2021-03-03 LAB — SEDIMENTATION RATE: Sed Rate: 71 mm/hr — ABNORMAL HIGH (ref 0–30)

## 2021-03-03 LAB — MAGNESIUM: Magnesium: 1.2 mg/dL — ABNORMAL LOW (ref 1.7–2.4)

## 2021-03-03 LAB — C-REACTIVE PROTEIN: CRP: 12.5 mg/dL — ABNORMAL HIGH (ref <1.0)

## 2021-03-03 LAB — APTT: aPTT: 31 seconds (ref 24–36)

## 2021-03-03 IMAGING — MR MR FOOT*L* W/O CM
7 series · 40 of 40 positions shown · non-contrast
Comparison: X-ray [DATE]

CLINICAL DATA: Diabetic foot ulcer

EXAM:
MRI OF THE LEFT FOOT WITHOUT CONTRAST
TECHNIQUE: Multiplanar, multisequence MR imaging of the left forefoot was
performed. No intravenous contrast was administered.

[Series 3: T1 · coronal · left · 3.0mm · 0.38mm/px · 7 of 47 slices shown (1 of 2)]
[im 1/47]
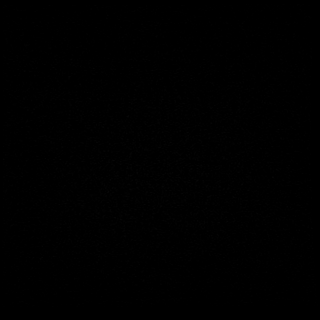
[im 8/47]
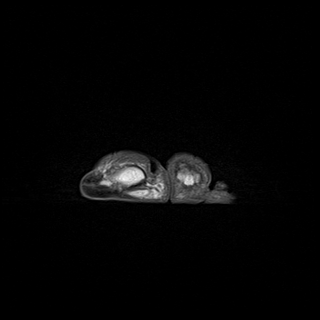
[im 16/47]
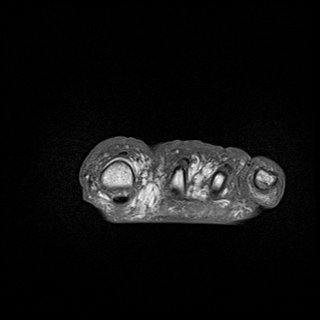
[im 24/47]
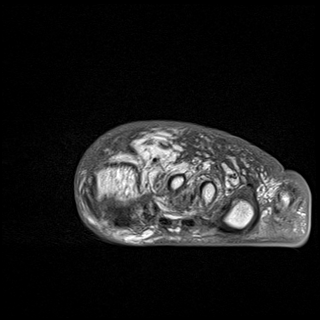
[im 31/47]
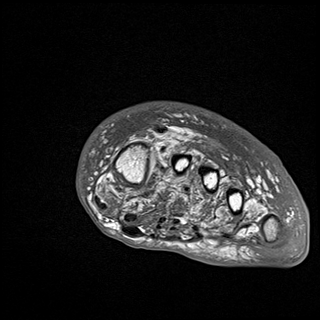
[im 39/47]
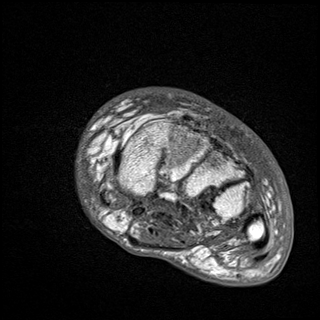
[im 47/47]
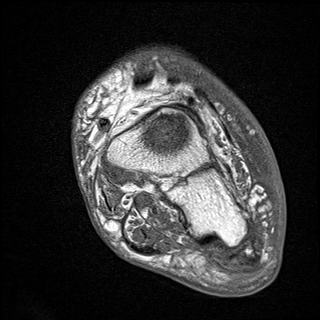

[Series 4: T2 · coronal · left · 3.0mm · 0.50mm/px · 8 of 46 slices shown (1 of 3)]
[im 1/46]
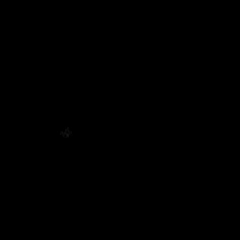
[im 7/46]
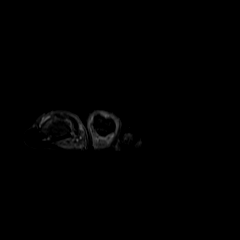
[im 13/46]
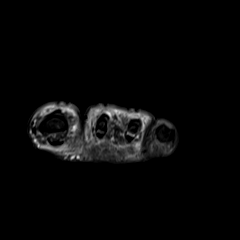
[im 20/46]
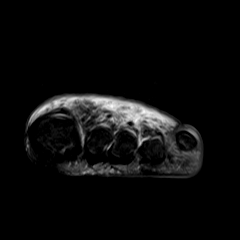
[im 26/46]
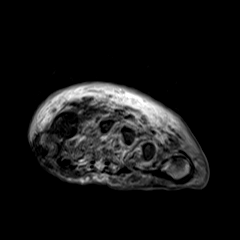
[im 33/46]
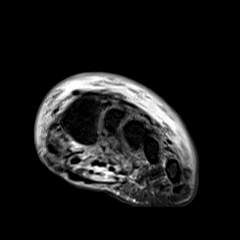
[im 39/46]
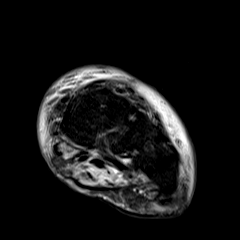
[im 46/46]
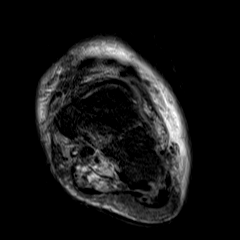

[Series 6: T1 · axial · left · 3.0mm · 0.70mm/px · z∈[-109,-31]mm · 4 of 22 slices shown (2 of 2)]
[im 1/22]
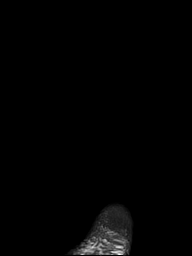
[im 8/22]
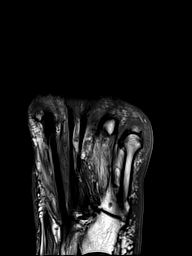
[im 15/22]
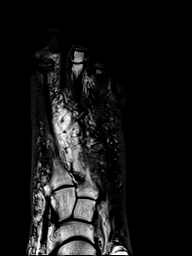
[im 22/22]
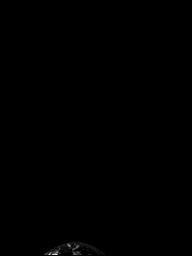

[Series 8: T2 · axial · left · 3.0mm · 0.70mm/px · z∈[-109,-31]mm · 4 of 22 slices shown (2 of 3)]
[im 1/22]
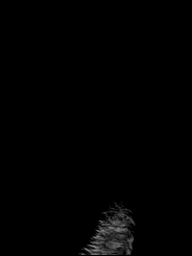
[im 8/22]
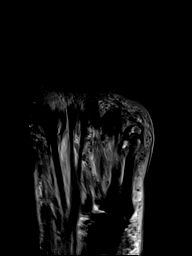
[im 15/22]
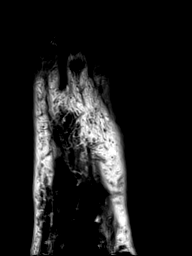
[im 22/22]
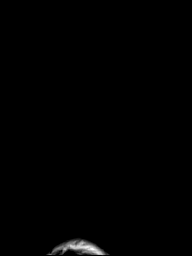

[Series 9: STIR · sagittal · left · 3.0mm · 0.62mm/px · 5 of 30 slices shown (1 of 2)]
[im 1/30]
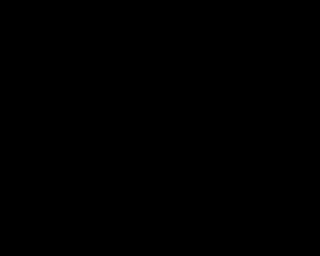
[im 8/30]
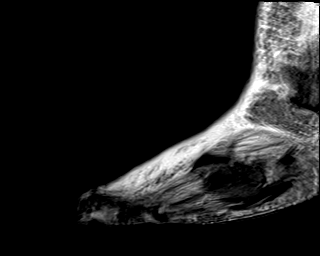
[im 15/30]
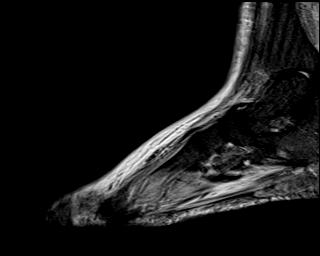
[im 22/30]
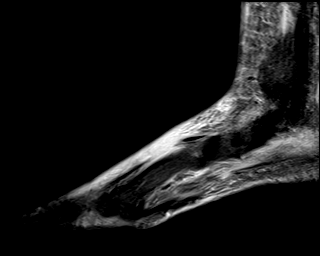
[im 30/30]
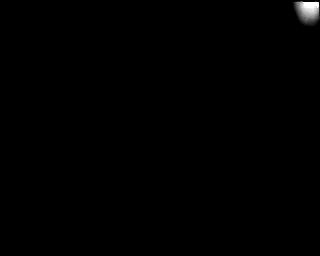

[Series 10: T2 · coronal · left · 3.0mm · 0.50mm/px · 7 of 45 slices shown (3 of 3)]
[im 1/45]
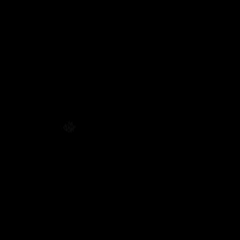
[im 8/45]
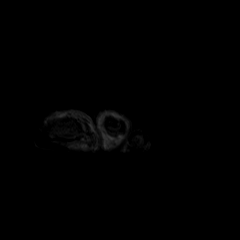
[im 15/45]
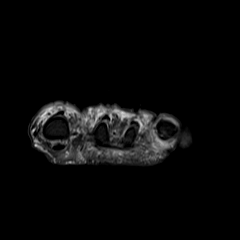
[im 23/45]
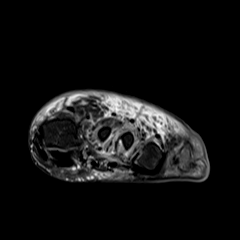
[im 30/45]
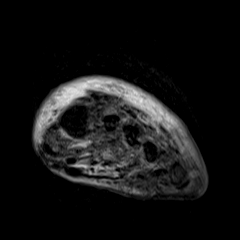
[im 37/45]
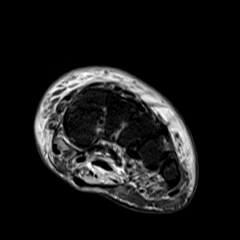
[im 45/45]
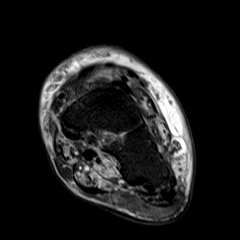

[Series 12: STIR · sagittal · left · 3.0mm · 0.62mm/px · 5 of 31 slices shown (2 of 2)]
[im 1/31]
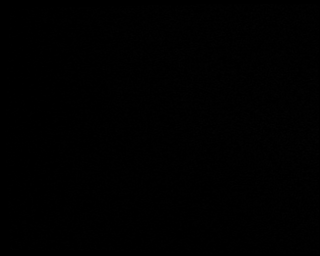
[im 8/31]
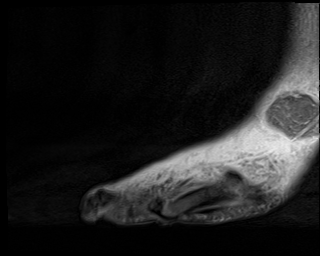
[im 16/31]
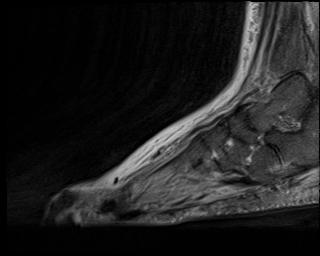
[im 23/31]
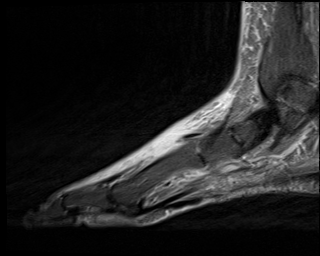
[im 31/31]
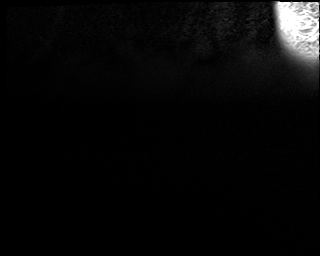

[40 of 40 positions shown; findings below may reference images not displayed]

FINDINGS: Bones/Joint/Cartilage

Bone marrow edema within the fifth metatarsal head and base of the
fifth toe proximal phalanx (series 8, images 13-17) with subtle
intermediate T1 marrow signal relative to the adjacent bony
structures. No erosion. Trace fifth MTP joint effusion.

Remaining osseous structures are intact. No fracture or dislocation.
Mild degenerative changes within the left forefoot including the
first MTP joint and tarsometatarsal joints.

Ligaments

Intact Lisfranc ligament. Collateral ligaments of the forefoot
appear intact.

Muscles and Tendons

Denervation changes of the intrinsic foot musculature. Mild diffuse
edema-like signal throughout the musculature which could reflect a
component of myositis. Intact flexor and extensor tendons. No
tenosynovial fluid collection.

Soft tissues

Soft tissue swelling and edema centered at the level of the fifth
MTP joint. There are a few scattered areas of susceptibility
artifact within the soft tissues corresponding to air seen on
previous radiographs (series 3, images 27 and 29). No organized or
drainable fluid collection. Prominent subcutaneous edema overlies
the dorsum of the foot.
IMPRESSION: 1. Bone marrow edema within the fifth metatarsal head and base of
the fifth toe proximal phalanx with subtle T1 marrow signal changes.
Findings are favored to represent early acute osteomyelitis.
2. Findings of cellulitis centered at the level of the fifth MTP
joint. There are a few scattered areas of susceptibility artifact
within the soft tissues corresponding to air seen on previous
radiographs. No organized or drainable fluid collection.
3. Mild diffuse edema-like signal throughout the musculature which
may reflect denervation changes and/or myositis.

## 2021-03-03 IMAGING — DX DG CHEST 1V PORT
1 series · 1 of 1 positions shown · non-contrast
Comparison: Chest radiograph dated [DATE].

CLINICAL DATA: 69-year-old female with concern for sepsis.

EXAM:
PORTABLE CHEST 1 VIEW

[chest ap]
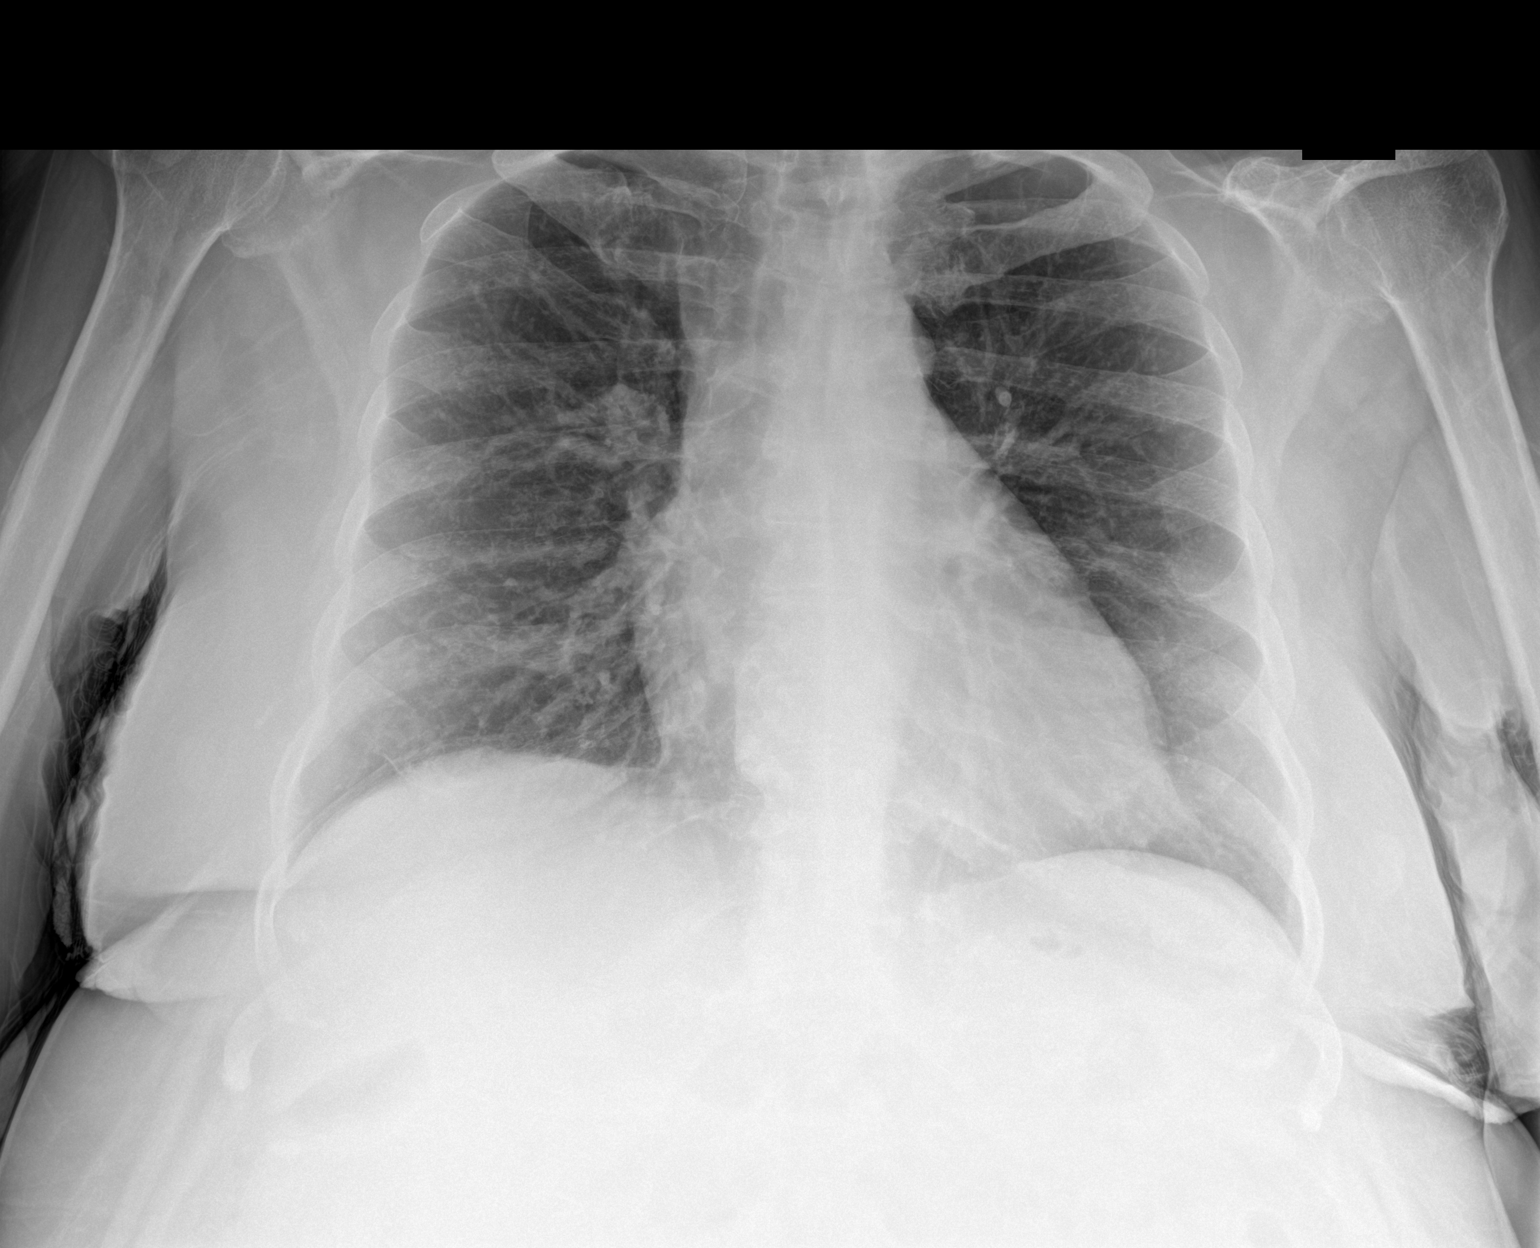

[1 of 1 positions shown; findings below may reference images not displayed]

FINDINGS: The lungs are clear. There is no pleural effusion pneumothorax. The
cardiac silhouette is within limits. No acute osseous pathology.
IMPRESSION: No active disease.

## 2021-03-03 IMAGING — DX DG FOOT COMPLETE 3+V*L*
3 series · 3 of 3 positions shown · non-contrast
Comparison: None.

CLINICAL DATA: Questionable sepsis. Left foot cellulitis. Open
wound.

EXAM:
LEFT FOOT - COMPLETE 3+ VIEW

[foot ap]
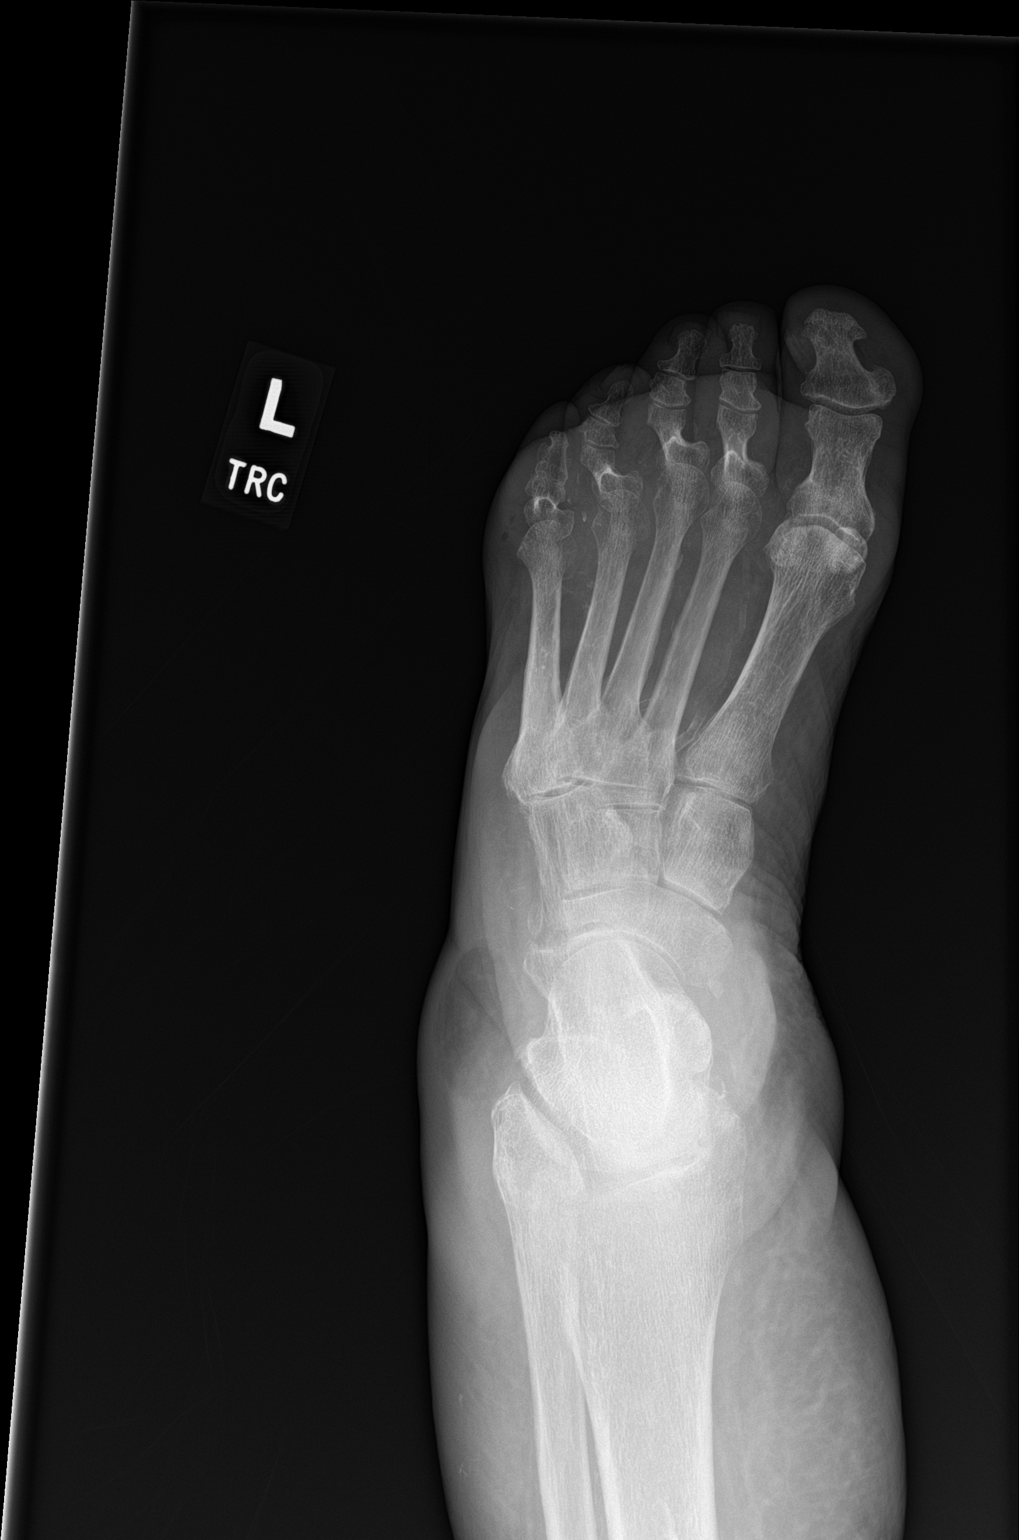

[foot obl]
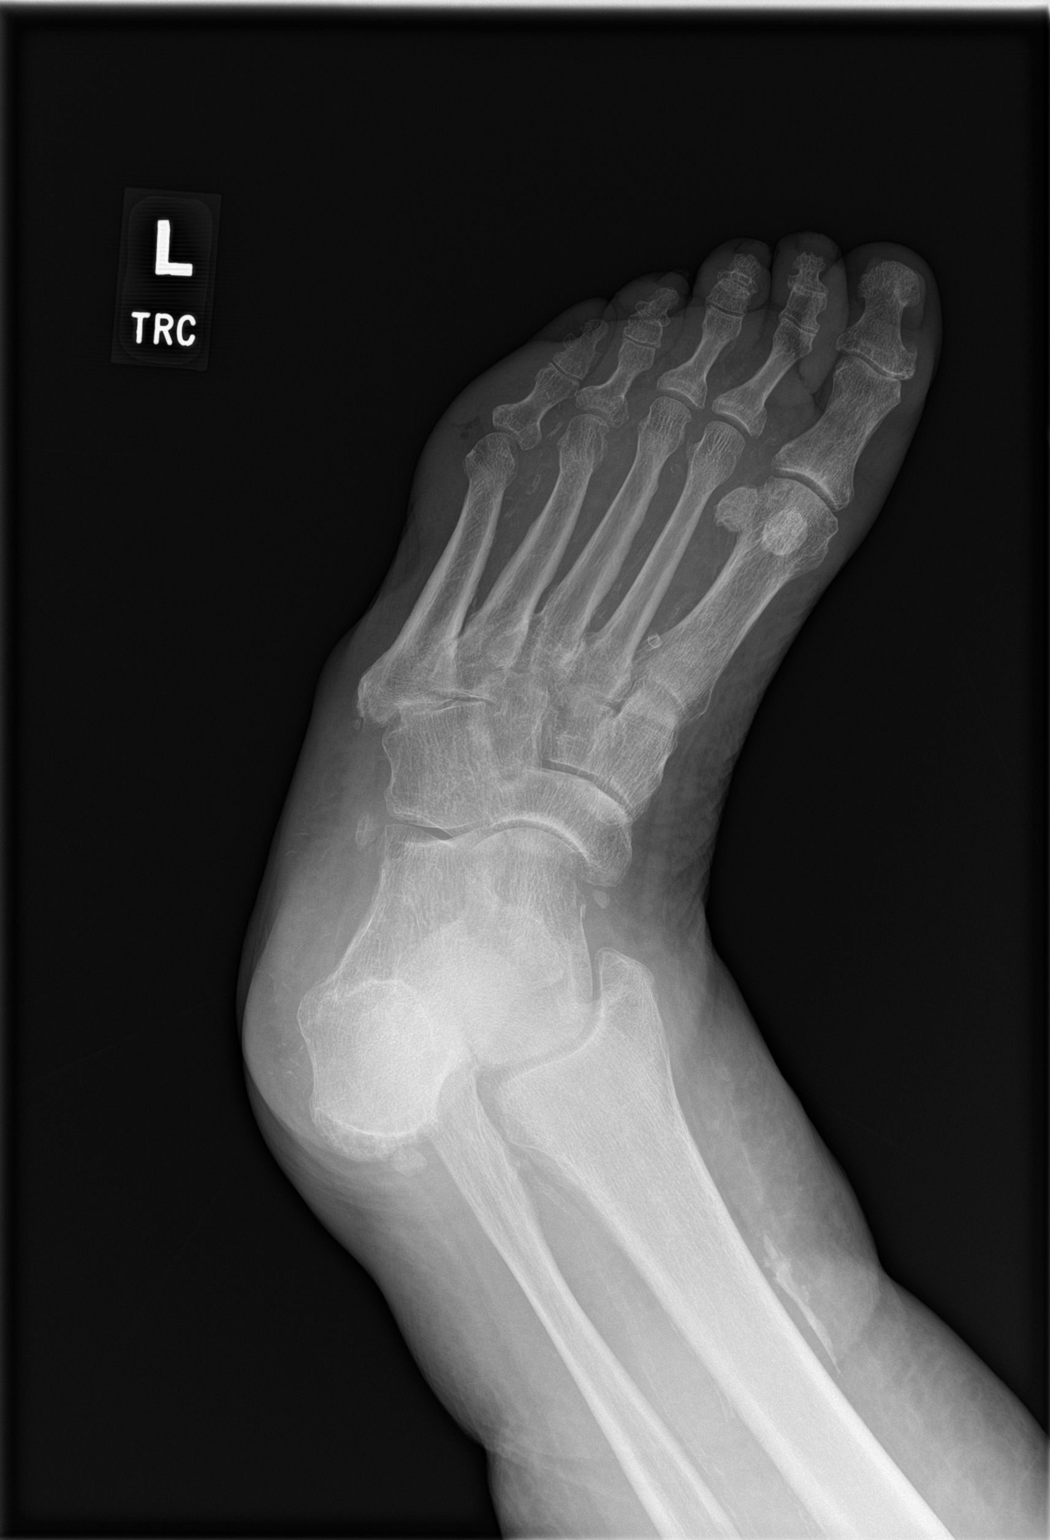

[foot lat]
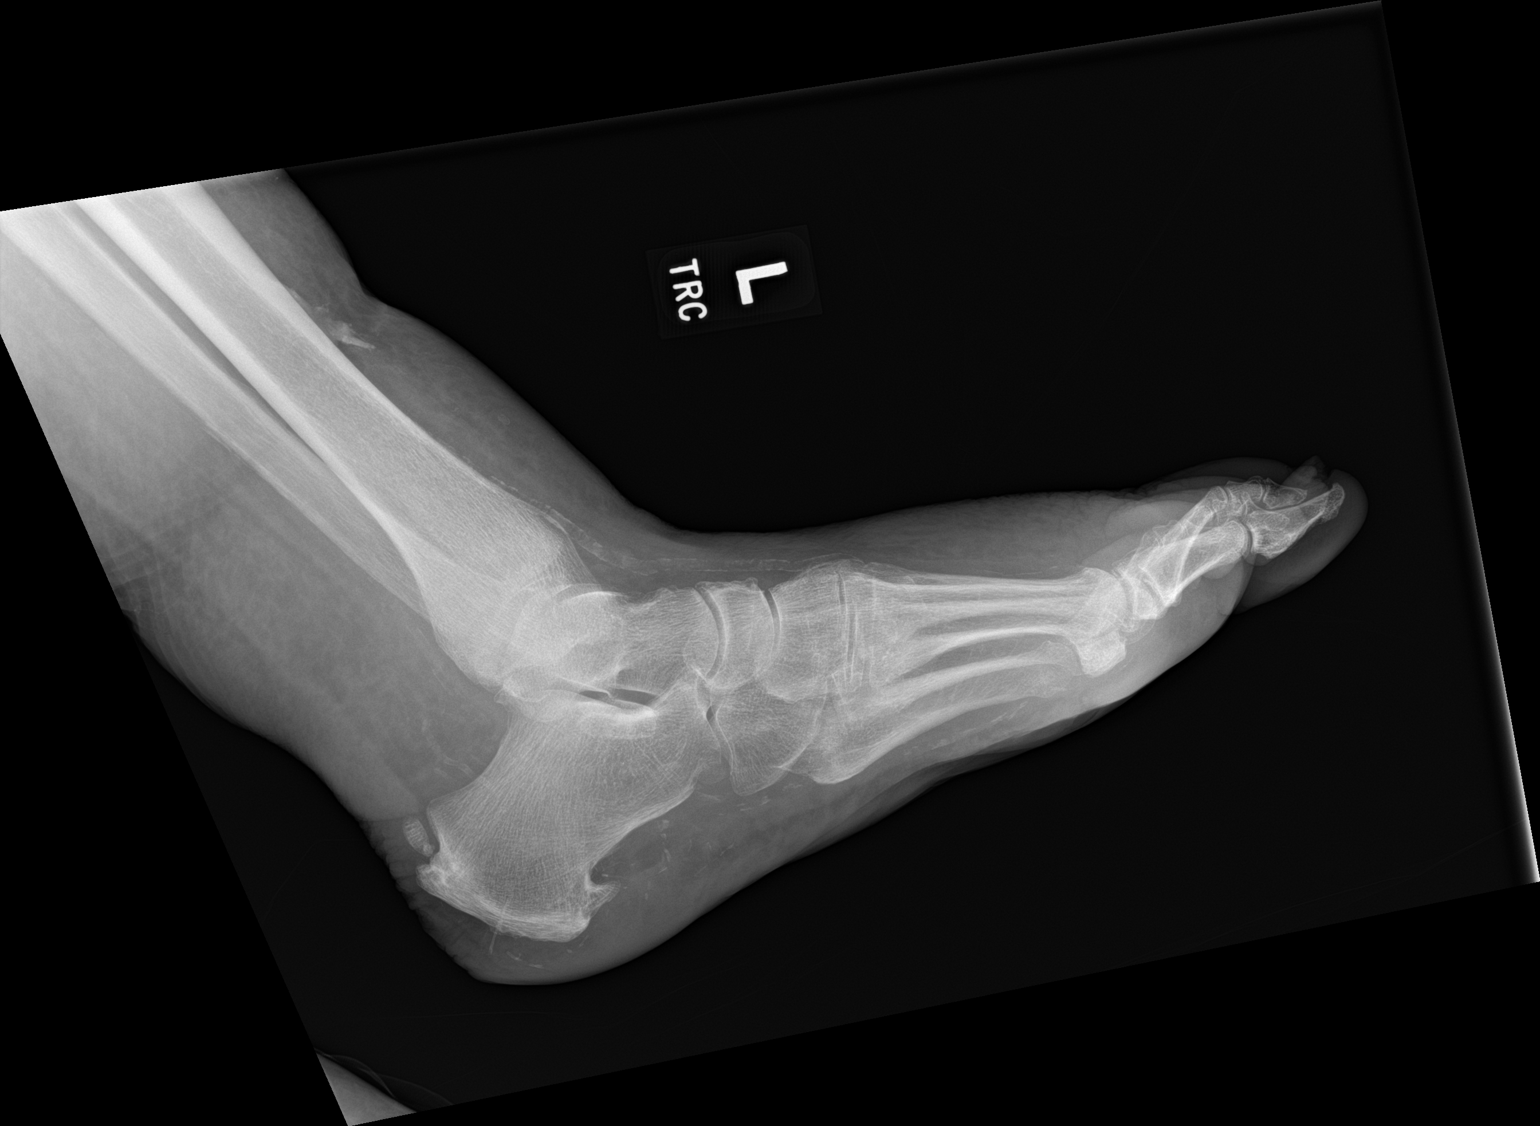

[3 of 3 positions shown; findings below may reference images not displayed]

FINDINGS: Soft tissue thickening with soft tissue air adjacent to the fifth
metatarsal phalangeal joint. No radiopaque foreign body. No definite
subjacent bony destructive change or erosion. Hammertoe deformity of
the digits. Mild pes planus and degenerative change in the midfoot.
Plantar calcaneal spur and fragmented Achilles tendon enthesophyte.
Advanced vascular calcifications. Soft tissue edema overlies the
dorsum of the foot.
IMPRESSION: 1. Soft tissue thickening with soft tissue air adjacent to the fifth
metatarsophalangeal joint suspicious for soft tissue infection.
Recommend correlation for site of ulcer. No radiographic findings of
osteomyelitis.
2. Advanced vascular calcifications.
3. Soft tissue edema overlies the dorsum of the foot.

## 2021-03-03 MED ORDER — TETANUS-DIPHTH-ACELL PERTUSSIS 5-2.5-18.5 LF-MCG/0.5 IM SUSY
0.5000 mL | PREFILLED_SYRINGE | Freq: Once | INTRAMUSCULAR | Status: AC
  Administered 2021-03-03: 0.5 mL via INTRAMUSCULAR
  Filled 2021-03-03: qty 0.5

## 2021-03-03 MED ORDER — LINAGLIPTIN 5 MG PO TABS
5.0000 mg | ORAL_TABLET | Freq: Every day | ORAL | Status: DC
  Administered 2021-03-05 – 2021-03-07 (×3): 5 mg via ORAL
  Filled 2021-03-03 (×5): qty 1

## 2021-03-03 MED ORDER — LETROZOLE 2.5 MG PO TABS: 2.5000 mg | ORAL_TABLET | Freq: Every day | ORAL | Status: DC

## 2021-03-03 MED ORDER — ONDANSETRON HCL 4 MG PO TABS: 4.0000 mg | ORAL_TABLET | Freq: Four times a day (QID) | ORAL | Status: DC | PRN

## 2021-03-03 MED ORDER — POTASSIUM CHLORIDE CRYS ER 20 MEQ PO TBCR
40.0000 meq | EXTENDED_RELEASE_TABLET | Freq: Once | ORAL | Status: AC
  Administered 2021-03-03: 40 meq via ORAL
  Filled 2021-03-03: qty 2

## 2021-03-03 MED ORDER — MAGNESIUM SULFATE 2 GM/50ML IV SOLN
2.0000 g | Freq: Once | INTRAVENOUS | Status: AC
  Administered 2021-03-04: 2 g via INTRAVENOUS
  Filled 2021-03-03: qty 50

## 2021-03-03 MED ORDER — TRAZODONE HCL 50 MG PO TABS
25.0000 mg | ORAL_TABLET | Freq: Every evening | ORAL | Status: DC | PRN
  Administered 2021-03-04: 25 mg via ORAL
  Filled 2021-03-03: qty 1

## 2021-03-03 MED ORDER — LISINOPRIL 10 MG PO TABS: 20.0000 mg | ORAL_TABLET | Freq: Every day | ORAL | Status: DC

## 2021-03-03 MED ORDER — PRAVASTATIN SODIUM 20 MG PO TABS
40.0000 mg | ORAL_TABLET | Freq: Every day | ORAL | Status: DC
  Administered 2021-03-04 – 2021-03-06 (×3): 40 mg via ORAL
  Filled 2021-03-03: qty 1
  Filled 2021-03-03 (×3): qty 2

## 2021-03-03 MED ORDER — VANCOMYCIN HCL 2000 MG/400ML IV SOLN
2000.0000 mg | Freq: Once | INTRAVENOUS | Status: AC
  Administered 2021-03-03: 2000 mg via INTRAVENOUS
  Filled 2021-03-03: qty 400

## 2021-03-03 MED ORDER — PAROXETINE HCL 20 MG PO TABS
40.0000 mg | ORAL_TABLET | Freq: Every day | ORAL | Status: DC
  Administered 2021-03-04 – 2021-03-06 (×3): 40 mg via ORAL
  Filled 2021-03-03 (×5): qty 2

## 2021-03-03 MED ORDER — ACETAMINOPHEN 650 MG RE SUPP: 650.0000 mg | Freq: Four times a day (QID) | RECTAL | Status: DC | PRN

## 2021-03-03 MED ORDER — ONDANSETRON HCL 4 MG/2ML IJ SOLN: 4.0000 mg | Freq: Four times a day (QID) | INTRAMUSCULAR | Status: DC | PRN

## 2021-03-03 MED ORDER — VANCOMYCIN HCL IN DEXTROSE 1-5 GM/200ML-% IV SOLN: 1000.0000 mg | Freq: Once | INTRAVENOUS | Status: DC

## 2021-03-03 MED ORDER — SODIUM CHLORIDE 0.9 % IV SOLN
2.0000 g | Freq: Once | INTRAVENOUS | Status: AC
  Administered 2021-03-03: 2 g via INTRAVENOUS
  Filled 2021-03-03 (×2): qty 2

## 2021-03-03 MED ORDER — LACTATED RINGERS IV BOLUS
1000.0000 mL | Freq: Once | INTRAVENOUS | Status: AC
  Administered 2021-03-03: 1000 mL via INTRAVENOUS

## 2021-03-03 MED ORDER — MAGNESIUM HYDROXIDE 400 MG/5ML PO SUSP: 30.0000 mL | Freq: Every day | ORAL | Status: DC | PRN

## 2021-03-03 MED ORDER — GABAPENTIN 300 MG PO CAPS
300.0000 mg | ORAL_CAPSULE | Freq: Three times a day (TID) | ORAL | Status: DC
  Administered 2021-03-04 – 2021-03-07 (×9): 300 mg via ORAL
  Filled 2021-03-03 (×9): qty 1

## 2021-03-03 MED ORDER — POTASSIUM CHLORIDE CRYS ER 20 MEQ PO TBCR
20.0000 meq | EXTENDED_RELEASE_TABLET | Freq: Every day | ORAL | Status: DC
  Administered 2021-03-04 – 2021-03-07 (×4): 20 meq via ORAL
  Filled 2021-03-03 (×4): qty 1

## 2021-03-03 MED ORDER — CALCIUM CARBONATE-VITAMIN D 500-200 MG-UNIT PO TABS
2.0000 | ORAL_TABLET | Freq: Every day | ORAL | Status: DC
  Administered 2021-03-04 – 2021-03-07 (×4): 2 via ORAL
  Filled 2021-03-03 (×5): qty 2

## 2021-03-03 MED ORDER — ALBUTEROL SULFATE HFA 108 (90 BASE) MCG/ACT IN AERS
2.0000 | INHALATION_SPRAY | Freq: Four times a day (QID) | RESPIRATORY_TRACT | Status: DC | PRN
  Filled 2021-03-03: qty 6.7

## 2021-03-03 MED ORDER — LABETALOL HCL 5 MG/ML IV SOLN
20.0000 mg | INTRAVENOUS | Status: DC | PRN
Start: 2021-03-03 — End: 2021-03-07

## 2021-03-03 MED ORDER — ENOXAPARIN SODIUM 40 MG/0.4ML ~~LOC~~ SOLN
40.0000 mg | SUBCUTANEOUS | Status: DC
  Administered 2021-03-04: 40 mg via SUBCUTANEOUS
  Filled 2021-03-03: qty 0.4

## 2021-03-03 MED ORDER — SODIUM CHLORIDE 0.9 % IV SOLN: INTRAVENOUS | Status: DC

## 2021-03-03 MED ORDER — ACETAMINOPHEN 325 MG PO TABS: 650.0000 mg | ORAL_TABLET | Freq: Four times a day (QID) | ORAL | Status: DC | PRN

## 2021-03-03 MED ORDER — VANCOMYCIN HCL 1250 MG/250ML IV SOLN
1250.0000 mg | INTRAVENOUS | Status: DC
  Filled 2021-03-03: qty 250

## 2021-03-03 MED ORDER — SODIUM CHLORIDE 0.9 % IV SOLN: 2.0000 g | Freq: Once | INTRAVENOUS | Status: DC

## 2021-03-03 MED ORDER — INSULIN ASPART 100 UNIT/ML ~~LOC~~ SOLN
0.0000 [IU] | Freq: Three times a day (TID) | SUBCUTANEOUS | Status: DC
  Administered 2021-03-04 – 2021-03-05 (×2): 3 [IU] via SUBCUTANEOUS
  Administered 2021-03-05: 5 [IU] via SUBCUTANEOUS
  Administered 2021-03-05: 3 [IU] via SUBCUTANEOUS
  Administered 2021-03-05: 2 [IU] via SUBCUTANEOUS
  Administered 2021-03-06: 8 [IU] via SUBCUTANEOUS
  Filled 2021-03-03 (×6): qty 1

## 2021-03-03 MED ORDER — SODIUM CHLORIDE 0.9 % IV SOLN
2.0000 g | Freq: Two times a day (BID) | INTRAVENOUS | Status: DC
  Administered 2021-03-04 – 2021-03-07 (×6): 2 g via INTRAVENOUS
  Filled 2021-03-03 (×8): qty 2

## 2021-03-03 NOTE — ED Notes (Signed)
Patient transported to MRI 

## 2021-03-03 NOTE — ED Notes (Signed)
Pharmacy messaged for missing dose of cefepime not in stock in ED.

## 2021-03-03 NOTE — ED Notes (Signed)
X-ray at bedside

## 2021-03-03 NOTE — ED Notes (Signed)
ED Provider at bedside discussing plan of care including plan for admission.

## 2021-03-03 NOTE — Consult Note (Addendum)
PHARMACY -  BRIEF ANTIBIOTIC NOTE   Pharmacy has received consult(s) for vancomycin and cefepime from an ED provider.  The patient's profile has been reviewed for ht/wt/allergies/indication/available labs.    One time order(s) placed for --Vancomycin 2 g IV (23 mg/kg) --Cefepime 2 g IV  Further antibiotics/pharmacy consults should be ordered by admitting physician if indicated.                       Thank you, Benita Gutter 03/03/2021  7:21 PM

## 2021-03-03 NOTE — H&P (Addendum)
Elizabeth [name]   PATIENT NAME: Elizabeth [name]    MR#:  [id number]  DATE OF BIRTH:  [birth year]  DATE OF ADMISSION:  03/03/2021  PRIMARY CARE PHYSICIAN: Elizabeth Merles, MD   Patient is coming from: Home  REQUESTING/REFERRING PHYSICIAN: Lucrezia Starch, MD  CHIEF COMPLAINT:   Chief Complaint  Patient presents with  . Foot Pain    HISTORY OF PRESENT ILLNESS:  Elizabeth [name] is a 70 y.o. Caucasian female with medical history significant for type diabetes mellitus, hypertension, lymphedema, CVA and psoriasis, who presented to the emergency room with acute onset of worsening left foot erythema, swelling, pain and tenderness for the last couple of days.  She denies any fever or chills.  No nausea or vomiting or abdominal pain.  No chest pain or palpitations.  No cough or wheezing.  No dysuria, oliguria or hematuria or flank pain.  She has a left plantar foot ulcer that is likely the source of infection. ED Course: When she came to the ER blood pressure was 98/68 with a temperature of 100 with otherwise normal vital signs.  After hydration blood pressure was up to 128/69.  Lab revealed mild hyponatremia and hypokalemia a BUN of 27 and creatinine 1.59 with low magnesium of 1.2.  Lactic acid was 2.1 and later 1.4.  CBC showed leukocytosis 19.3 with neutrophilia and anemia EKG as reviewed by me : EKG showed sinus rhythm with rate of 88 with premature supraventricular complexes and slightly poor R wave progression Imaging: Chest x-ray showed no acute cardiopulmonary disease. Left foot x-ray showed the following: 1. Soft tissue thickening with soft tissue air adjacent to the fifth metatarsophalangeal joint suspicious for soft tissue infection. Recommend correlation for site of ulcer. No radiographic findings of osteomyelitis. 2. Advanced vascular calcifications. 3. Soft tissue edema overlies the dorsum of the foot.  Patient was given IV vancomycin and cefepime, 1 L bolus of IV lactated  Ringer, 2 g of IV magnesium sulfate and 40 mEq p.o. potassium chloride as well as Tdap booster.  She will be admitted to a medical bed for further evaluation and management.  PAST MEDICAL HISTORY:   Past Medical History:  Diagnosis Date  . Cancer [facility])    breast  . Carcinoma of right breast ([place]) 05/20/2014  . Diabetes mellitus without complication ([place])   . Hypertension   . Lymphedema   . Personal history of radiation therapy 2015   Right breast  . PONV (postoperative nausea and vomiting)   . Psoriasis 1990  . Stroke ([place]) 2000   residual left arm weakness  . Varicose veins     PAST SURGICAL HISTORY:   Past Surgical History:  Procedure Laterality Date  . AMPUTATION TOE Right 01/25/2017   Procedure: AMPUTATION TOE   ;  Surgeon: Albertine Patricia, DPM;  Location: [facility] ORS;  Service: Podiatry;  Laterality: Right;  . BREAST EXCISIONAL BIOPSY Right 2015   [facility] / with radiation  . BREAST LUMPECTOMY Right 04-2014   followed by radiation,  no chemo  . BREAST REDUCTION SURGERY  1977  . CHOLECYSTECTOMY N/A 09/23/2015   Procedure: LAPAROSCOPIC CHOLECYSTECTOMY;  Surgeon: Bonner Puna, MD;  Location: [facility] ORS;  Service: General;  Laterality: N/A;  . DILATION AND CURETTAGE OF UTERUS  2010  . EYE SURGERY Left 2015   cataracts and left eye detached retina repair  . EYE SURGERY Right 2013   cataract with len implant  . GASTRIC ROUX-EN-Y N/A 09/23/2015   Procedure:  LAPAROSCOPIC ROUX-EN-Y GASTRIC;  Surgeon: Bonner Puna, MD;  Location: [facility] ORS;  Service: General;  Laterality: N/A;  . HIATAL HERNIA REPAIR N/A 09/23/2015   Procedure: LAPAROSCOPIC REPAIR OF HIATAL HERNIA;  Surgeon: Bonner Puna, MD;  Location: [facility] ORS;  Service: General;  Laterality: N/A;  . IRRIGATION AND DEBRIDEMENT FOOT Right 01/25/2017   Procedure: IRRIGATION AND DEBRIDEMENT FOOT;  Surgeon: Albertine Patricia, DPM;  Location: [facility] ORS;  Service: Podiatry;  Laterality: Right;  . IRRIGATION AND DEBRIDEMENT FOOT Right 01/29/2017    Procedure: IRRIGATION AND DEBRIDEMENT FOOT and application of wound vac;  Surgeon: Albertine Patricia, DPM;  Location: [facility] ORS;  Service: Podiatry;  Laterality: Right;  . LOWER EXTREMITY ANGIOGRAPHY Right 01/28/2017   Procedure: Lower Extremity Angiography;  Surgeon: Katha Cabal, MD;  Location: [place] [facility];  Service: Cardiovascular;  Laterality: Right;  . LOWER EXTREMITY INTERVENTION  01/28/2017   Procedure: Lower Extremity Intervention;  Surgeon: Katha Cabal, MD;  Location: [place] [facility];  Service: Cardiovascular;;  . REDUCTION MAMMAPLASTY      SOCIAL HISTORY:   Social History   Tobacco Use  . Smoking status: Never Smoker  . Smokeless tobacco: Never Used  Substance Use Topics  . Alcohol use: No    Alcohol/week: 0.0 standard drinks    FAMILY HISTORY:   Family History  Problem Relation Age of Onset  . Diabetes Mother   . Cancer Mother   . Breast cancer Mother 54  . Diabetes Father   . Diabetes Sister   . Diabetes Brother     DRUG ALLERGIES:  No Known Allergies  REVIEW OF SYSTEMS:   ROS As per history of present illness. All pertinent systems were reviewed above. Constitutional, HEENT, cardiovascular, respiratory, GI, GU, musculoskeletal, neuro, psychiatric, endocrine, integumentary and hematologic systems were reviewed and are otherwise negative/unremarkable except for positive findings mentioned above in the HPI.   MEDICATIONS AT HOME:   Prior to Admission medications   Medication Sig Start Date End Date Taking? Authorizing Provider  albuterol (PROVENTIL HFA;VENTOLIN HFA) 108 (90 BASE) MCG/ACT inhaler Inhale 2 puffs into the lungs every 6 (six) hours as needed for wheezing or shortness of breath.    [provider]  calcium-vitamin D (OSCAL WITH D) 500-200 MG-UNIT tablet Take 2 tablets by mouth daily with breakfast. 11/23/16   Lloyd Huger, MD  clopidogrel (PLAVIX) 75 MG tablet Take 75 mg by mouth. 11/06/15   [provider]  gabapentin (NEURONTIN) 300 MG capsule Take 300 mg by mouth 3 (three) times daily.     [provider]  letrozole (FEMARA) 2.5 MG tablet Take 1 tablet (2.5 mg total) by mouth daily. 11/21/17   Lloyd Huger, MD  linagliptin (TRADJENTA) 5 MG TABS tablet Take 5 mg by mouth daily.    [provider]  lisinopril (PRINIVIL,ZESTRIL) 20 MG tablet Take 20 mg by mouth daily. 04/21/18   [provider]  metFORMIN (GLUCOPHAGE) 1000 MG tablet Take 1,000 mg by mouth daily with breakfast.     [provider]  PARoxetine (PAXIL) 40 MG tablet Take 40 mg by mouth daily.    [provider]  potassium chloride (MICRO-K) 10 MEQ CR capsule Take 10 mEq by mouth daily.  01/20/17   [provider]  pravastatin (PRAVACHOL) 40 MG tablet Take 40 mg by mouth daily. In afternoon    [provider]  TRUE METRIX BLOOD GLUCOSE TEST test strip  10/13/18   [provider]  TRUEPLUS LANCETS 33G MISC  10/13/18   [provider]      VITAL SIGNS:  Blood pressure 128/69, pulse 86, temperature 100 F (37.8 C), temperature source Oral, resp. rate 18, height 5\' 3"  (1.6 m), weight 86.2 kg, SpO2 97 %.  PHYSICAL EXAMINATION:  Physical Exam  GENERAL:  70 y.o.-year-old Caucasian female patient lying in the bed with no acute distress.  EYES: Pupils equal, round, reactive to light and accommodation. No scleral icterus. Extraocular muscles intact.  HEENT: Head atraumatic, normocephalic. Oropharynx and nasopharynx clear.  NECK:  Supple, no jugular venous distention. No thyroid enlargement, no tenderness.  LUNGS: Normal breath sounds bilaterally, no wheezing, rales,rhonchi or crepitation. No use of accessory muscles of respiration.  CARDIOVASCULAR: Regular rate and rhythm, S1, S2 normal. No murmurs, rubs, or gallops.  ABDOMEN: Soft, nondistended, nontender. Bowel sounds present. No organomegaly or mass.  EXTREMITIES: No leg edema, cyanosis, or clubbing.   NEUROLOGIC: Cranial nerves II through XII are intact. Muscle strength 5/5 in all extremities. Sensation intact. Gait not checked.  PSYCHIATRIC: The patient is alert and oriented x 3.  Normal affect and good eye contact. SKIN: Left dorsal foot erythema with induration and lichenification with warmth and tenderness extending to the plantar side of the foot and surrounding a fifth metatarsal ulcer overlying a callus.      LABORATORY PANEL:   CBC Recent Labs  Lab 03/03/21 1917  WBC 19.3*  HGB 10.8*  HCT 33.7*  PLT 202   ------------------------------------------------------------------------------------------------------------------  Chemistries  Recent Labs  Lab 03/03/21 1917  NA 134*  K 3.4*  CL 100  CO2 22  GLUCOSE 145*  BUN 27*  CREATININE 1.59*  CALCIUM 8.5*  MG 1.2*  AST 14*  ALT 10  ALKPHOS 73  BILITOT 0.9   ------------------------------------------------------------------------------------------------------------------  Cardiac Enzymes No results for input(s): TROPONINI in the last 168 hours. ------------------------------------------------------------------------------------------------------------------  RADIOLOGY:  DG Chest Port 1 View  Result Date: 03/03/2021 CLINICAL DATA:  70 year old female with concern for sepsis. EXAM: PORTABLE CHEST 1 VIEW COMPARISON:  Chest radiograph dated 06/26/2019. FINDINGS: The lungs are clear. There is no pleural effusion pneumothorax. The cardiac silhouette is within limits. No acute osseous pathology. IMPRESSION: No active disease. Electronically Signed   By: Anner Crete M.D.   On: 03/03/2021 19:41   DG Foot Complete Left  Result Date: 03/03/2021 CLINICAL DATA:  Questionable sepsis. Left foot cellulitis. Open wound. EXAM: LEFT FOOT - COMPLETE 3+ VIEW COMPARISON:  None. FINDINGS: Soft tissue thickening with soft tissue air adjacent to the fifth metatarsal phalangeal joint. No radiopaque foreign body. No definite  subjacent bony destructive change or erosion. Hammertoe deformity of the digits. Mild pes planus and degenerative change in the midfoot. Plantar calcaneal spur and fragmented Achilles tendon enthesophyte. Advanced vascular calcifications. Soft tissue edema overlies the dorsum of the foot. IMPRESSION: 1. Soft tissue thickening with soft tissue air adjacent to the fifth metatarsophalangeal joint suspicious for soft tissue infection. Recommend correlation for site of ulcer. No radiographic findings of osteomyelitis. 2. Advanced vascular calcifications. 3. Soft tissue edema overlies the dorsum of the foot. Electronically Signed   By: Keith Rake M.D.   On: 03/03/2021 19:43      IMPRESSION AND PLAN:  Active Problems:   Cellulitis in diabetic foot ([place])  1.  Left severe diabetic foot cellulitis like secondary to infected plantar left foot ulcer. - The patient will be admitted to a medical bed. - We will continue IV antibiotic therapy with vancomycin and cefepime per  cellulitis protocol. - Pain management will be provided. - Warm compresses will be utilized. - Podiatry consult will be obtained. - Dr. Caryl Comes was notified about the patient. - We will obtain an MRI of the left foot to rule out osteomyelitis.  2.  Acute kidney injury superimposed on stage IIIa chronic kidney disease with associated hypokalemia and hypomagnesemia. - We will hydrate with IV normal saline and follow BMP. - We will hold off nephrotoxins. - Potassium and magnesium will be replaced and follow.  3.  Essential hypertension. - We will continue Coreg and hold off lisinopril.  4.  Type 2 diabetes mellitus with peripheral neuropathy. - We will place her on supplemental coverage with NovoLog and continue her basal coverage. - We will hold off metformin given acute kidney injury. - We will continue Neurontin.  5.  Dyslipidemia. - We will continue statin therapy.  6.  History of breast cancer. - We will continue  letrozole.  7.  Depression. - We will continue her Paxil.  DVT prophylaxis: Lovenox. Code Status: full code. Family Communication:  The plan of care was discussed in details with the patient (who requested no other family members to be notified.). I answered all questions. The patient agreed to proceed with the above mentioned plan. Further management will depend upon hospital course. Disposition Plan: Back to previous home environment Consults called: Podiatry All the records are reviewed and case discussed with ED provider.  Status is: Inpatient  Remains inpatient appropriate because:Ongoing active pain requiring inpatient pain management, Ongoing diagnostic testing needed not appropriate for outpatient work up, Unsafe d/c plan, IV treatments appropriate due to intensity of illness or inability to take PO and Inpatient level of care appropriate due to severity of illness   Dispo: The patient is from: Home              Anticipated d/c is to: Home              Patient currently is not medically stable to d/c.   Difficult to place patient No   TOTAL TIME TAKING CARE OF THIS PATIENT: 55 minutes.    Christel Mormon M.D on 03/03/2021 at 9:24 PM  [facility]   From 7 PM-7 AM, contact night-coverage [email or web address]  CC: Primary care physician; Elizabeth Merles, MD

## 2021-03-03 NOTE — ED Notes (Signed)
Vanc paused to administer cefepime now here from pharmacy. Will resume vanc after.

## 2021-03-03 NOTE — Consult Note (Signed)
Pharmacy Antibiotic Note  Elizabeth [name] is a 70 y.o. female with medical history including diabetes, PVD admitted on 03/03/2021 with cellulitis of left foot. Imaging impression of soft tissue infection without evidence of osteomyelitis. ESR 71, CRP pending. WBC 19.3. Pharmacy has been consulted for vancomycin and cefepime dosing.  Scr 1.59, unclear baseline  Plan:  Cefepime 2 g IV q12h (renally adjusted)  Vancomycin 2 g IV LD x 1 followed by maintenance regimen of vancomycin 1250 mg IV q48h --Calculated AUC 530, Cmin 11.1 --Daily Scr per protocol --Levels at steady state as clinically indicated  Height: _0  (160 cm) Weight: 86.2 kg (190 lb) IBW/kg (Calculated) : 52.4  Temp (24hrs), Avg:100 F (37.8 C), Min:100 F (37.8 C), Max:100 F (37.8 C)  Recent Labs  Lab 03/03/21 1917  WBC 19.3*  CREATININE 1.59*  LATICACIDVEN 2.1*    Estimated Creatinine Clearance: 34.7 mL/min (A) (by C-G formula based on SCr of 1.59 mg/dL (H)).    No Known Allergies  Antimicrobials this admission: Cefepime 4/26 >>  Vancomycin 4/26 >>   Dose adjustments this admission: N/A  Microbiology results: 4/26 BCx: pending 4/26 UCx: pending  Thank you for allowing pharmacy to be a part of this patient's care.  Benita Gutter 03/03/2021 9:25 PM

## 2021-03-03 NOTE — ED Triage Notes (Addendum)
Pt comes with c/o cellulitis to left foot. Pt states she noticed it was red today. Pt states her PCP sent her here for further evaluation.   Pt is diabetic.  Pt states open wound with pus. Pt states this started 3-4 days ago.

## 2021-03-03 NOTE — ED Provider Notes (Addendum)
Left foot  [facility] Emergency Department Provider Note  ____________________________________________   Event Date/Time   First MD Initiated Contact with Patient 03/03/21 1853     (approximate)  I have reviewed the triage vital signs and the nursing notes.   HISTORY  Chief Complaint Foot Pain   HPI Elizabeth [name] is a 70 y.o. 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  Patient states she noticed the wound about a week to week and a half ago but the redness started yesterday and has been spreading up to midfoot and over the top of the foot.  She denies any preceding or precipitating injuries or falls.  Does not think she had any puncture wounds.  No headache, earache, sore throat, fever, chills, cough, nausea, vomiting, diarrhea, dysuria, back pain, or other extremity pain or injuries.  No prior similar episodes.  No clear alleviating aggravating factors.          Past Medical History:  Diagnosis Date  . Cancer [facility])    breast  . Carcinoma of right breast ([place]) 05/20/2014  . Diabetes mellitus without complication ([place])   . Hypertension   . Lymphedema   . Personal history of radiation therapy 2015   Right breast  . PONV (postoperative nausea and vomiting)   . Psoriasis 1990  . Stroke ([place]) 2000   residual left arm weakness  . Varicose veins     Patient Active Problem List   Diagnosis Date Noted  . Cellulitis in diabetic foot ([place]) 03/03/2021  . Sepsis ([place]) 05/10/2018  . Pressure injury of skin 05/10/2018  . Osteomyelitis of right foot ([place]) 01/28/2017  . Type 2 diabetes mellitus with peripheral vascular disease ([place]) 01/28/2017  .  Cellulitis 01/22/2017  . Hypokalemia 01/22/2017  . Morbid obesity ([place]) 09/23/2015  . Diabetes mellitus, type 2 ([place]) 05/21/2015  . Peripheral vascular disease ([place]) 05/21/2015  . 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 ([place]) 05/21/2015  . Chronic venous insufficiency 02/05/2015  . Mixed incontinence 10/24/2014  . Extreme obesity 10/24/2014  . Hernia, rectovaginal 10/24/2014  . Airway hyperreactivity 02/19/2013  . Cerebral vascular accident ([place]) 02/19/2013  . Clinical depression 02/19/2013  . Diabetes mellitus type 2 in obese ([place]) 02/19/2013  . Colon, diverticulosis 02/19/2013  . Acid reflux 02/19/2013  . HLD (hyperlipidemia) 02/19/2013  . BP (high blood pressure) 02/19/2013    Past Surgical History:  Procedure Laterality Date  . AMPUTATION TOE Right 01/25/2017   Procedure: AMPUTATION TOE   ;  Surgeon: Albertine Patricia, DPM;  Location: [facility] ORS;  Service: Podiatry;  Laterality: Right;  . BREAST EXCISIONAL BIOPSY Right 2015   [facility] / with radiation  . BREAST LUMPECTOMY Right 04-2014   followed by radiation,  no chemo  . BREAST REDUCTION SURGERY  1977  . CHOLECYSTECTOMY N/A 09/23/2015   Procedure: LAPAROSCOPIC CHOLECYSTECTOMY;  Surgeon: Bonner Puna, MD;  Location: [facility] ORS;  Service: General;  Laterality: N/A;  . DILATION AND CURETTAGE OF UTERUS  2010  . EYE SURGERY Left 2015   cataracts and left eye detached retina repair  . EYE SURGERY Right 2013   cataract with len implant  . GASTRIC ROUX-EN-Y N/A 09/23/2015  Procedure: LAPAROSCOPIC ROUX-EN-Y GASTRIC;  Surgeon: Bonner Puna, MD;  Location: [facility] ORS;  Service: General;  Laterality: N/A;  . HIATAL HERNIA REPAIR N/A 09/23/2015   Procedure: LAPAROSCOPIC REPAIR OF HIATAL HERNIA;  Surgeon: Bonner Puna, MD;  Location: [facility] ORS;  Service: General;  Laterality: N/A;  . IRRIGATION AND DEBRIDEMENT FOOT Right 01/25/2017   Procedure: IRRIGATION AND DEBRIDEMENT FOOT;  Surgeon: Albertine Patricia, DPM;  Location: [facility] ORS;   Service: Podiatry;  Laterality: Right;  . IRRIGATION AND DEBRIDEMENT FOOT Right 01/29/2017   Procedure: IRRIGATION AND DEBRIDEMENT FOOT and application of wound vac;  Surgeon: Albertine Patricia, DPM;  Location: [facility] ORS;  Service: Podiatry;  Laterality: Right;  . LOWER EXTREMITY ANGIOGRAPHY Right 01/28/2017   Procedure: Lower Extremity Angiography;  Surgeon: Katha Cabal, MD;  Location: [place] [facility];  Service: Cardiovascular;  Laterality: Right;  . LOWER EXTREMITY INTERVENTION  01/28/2017   Procedure: Lower Extremity Intervention;  Surgeon: Katha Cabal, MD;  Location: [place] [facility];  Service: Cardiovascular;;  . REDUCTION MAMMAPLASTY      Prior to Admission medications   Medication Sig Start Date End Date Taking? Authorizing Provider  albuterol (PROVENTIL HFA;VENTOLIN HFA) 108 (90 BASE) MCG/ACT inhaler Inhale 2 puffs into the lungs every 6 (six) hours as needed for wheezing or shortness of breath.    [provider]  calcium-vitamin D (OSCAL WITH D) 500-200 MG-UNIT tablet Take 2 tablets by mouth daily with breakfast. 11/23/16   Lloyd Huger, MD  clopidogrel (PLAVIX) 75 MG tablet Take 75 mg by mouth. 11/06/15   [provider]  gabapentin (NEURONTIN) 300 MG capsule Take 300 mg by mouth 3 (three) times daily.     [provider]  letrozole (FEMARA) 2.5 MG tablet Take 1 tablet (2.5 mg total) by mouth daily. 11/21/17   Lloyd Huger, MD  linagliptin (TRADJENTA) 5 MG TABS tablet Take 5 mg by mouth daily.    [provider]  lisinopril (PRINIVIL,ZESTRIL) 20 MG tablet Take 20 mg by mouth daily. 04/21/18   [provider]  metFORMIN (GLUCOPHAGE) 1000 MG tablet Take 1,000 mg by mouth daily with breakfast.     [provider]  PARoxetine (PAXIL) 40 MG tablet Take 40 mg by mouth daily.    [provider]  potassium chloride (MICRO-K) 10 MEQ CR capsule Take 10 mEq by mouth daily.  01/20/17   [provider]   pravastatin (PRAVACHOL) 40 MG tablet Take 40 mg by mouth daily. In afternoon    [provider]  TRUE METRIX BLOOD GLUCOSE TEST test strip  10/13/18   [provider]  TRUEPLUS LANCETS 33G [place]  10/13/18   [provider]    Allergies Patient has no known allergies.  Family History  Problem Relation Age of Onset  . Diabetes Mother   . Cancer Mother   . Breast cancer Mother 56  . Diabetes Father   . Diabetes Sister   . Diabetes Brother     Social History Social History   Tobacco Use  . Smoking status: Never Smoker  . Smokeless tobacco: Never Used  Substance Use Topics  . Alcohol use: No    Alcohol/week: 0.0 standard drinks  . Drug use: No    Review of Systems  Review of Systems  Constitutional: Positive for malaise/fatigue. Negative for chills and fever.  HENT: Negative for sore throat.   Eyes: Negative for pain.  Respiratory: Negative for cough and stridor.   Cardiovascular: Negative  for chest pain.  Gastrointestinal: Negative for vomiting.  Genitourinary: Negative for dysuria.  Musculoskeletal: Positive for myalgias ( L foot).  Skin: Negative for rash.  Neurological: Negative for seizures, loss of consciousness and headaches.  Psychiatric/Behavioral: Negative for suicidal ideas.  All other systems reviewed and are negative.     ____________________________________________   PHYSICAL EXAM:  VITAL SIGNS: ED Triage Vitals  Enc Vitals Group     BP 03/03/21 1847 98/68     Pulse Rate 03/03/21 1847 72     Resp 03/03/21 1847 16     Temp 03/03/21 1847 100 F (37.8 C)     Temp Source 03/03/21 1847 Oral     SpO2 03/03/21 1847 97 %     Weight 03/03/21 1848 190 lb (86.2 kg)     Height 03/03/21 1848 _0  (1.6 m)     Head Circumference --      Peak Flow --      Pain Score 03/03/21 1848 10     Pain Loc --      Pain Edu? --      Excl. in [place]? --    Vitals:   03/03/21 1847 03/03/21 1959  BP: 98/68 128/69  Pulse: 72 86  Resp: 16 18   Temp: 100 F (37.8 C)   SpO2: 97% 97%   Physical Exam Vitals and nursing note reviewed.  Constitutional:      General: She is not in acute distress.    Appearance: She is well-developed. She is ill-appearing.  HENT:     Head: Normocephalic and atraumatic.     Right Ear: External ear normal.     Left Ear: External ear normal.     Nose: Nose normal.     Mouth/Throat:     Mouth: Mucous membranes are dry.  Eyes:     Conjunctiva/sclera: Conjunctivae normal.  Cardiovascular:     Rate and Rhythm: Normal rate and regular rhythm.     Heart sounds: No murmur heard.   Pulmonary:     Effort: Pulmonary effort is normal. No respiratory distress.     Breath sounds: Normal breath sounds.  Abdominal:     Palpations: Abdomen is soft.     Tenderness: There is no abdominal tenderness.  Musculoskeletal:     Cervical back: Neck supple.     Right lower leg: Edema present.     Left lower leg: Edema present.  Skin:    General: Skin is warm and dry.     Capillary Refill: Capillary refill takes 2 to 3 seconds.  Neurological:     Mental Status: She is alert and oriented to person, place, and time.  Psychiatric:        Mood and Affect: Mood normal.     There is bilateral lower extremity edema although there is some erythema extending from the ulcer on the base of the foot just proximal to the interphalangeal metacarpal joint and extending up to the midfoot primarily over the dorsum.  2+ DP pulse.  Sensation intact to light touch throughout the foot.  Patient has forage motion and strength of the ankle.  Remainder of lower extremities unremarkable.       ____________________________________________   LABS (all labs ordered are listed, but only abnormal results are displayed)  Labs Reviewed  LACTIC ACID, PLASMA - Abnormal; Notable for the following components:      Result Value   Lactic Acid, Venous 2.1 (*)    All other components within normal limits  COMPREHENSIVE METABOLIC PANEL -  Abnormal; Notable for the following components:   Sodium 134 (*)    Potassium 3.4 (*)    Glucose, Bld 145 (*)    BUN 27 (*)    Creatinine, Ser 1.59 (*)    Calcium 8.5 (*)    Albumin 3.2 (*)    AST 14 (*)    GFR, Estimated 35 (*)    All other components within normal limits  CBC WITH DIFFERENTIAL/PLATELET - Abnormal; Notable for the following components:   WBC 19.3 (*)    Hemoglobin 10.8 (*)    HCT 33.7 (*)    Neutro Abs 16.8 (*)    Monocytes Absolute 1.4 (*)    All other components within normal limits  SEDIMENTATION RATE - Abnormal; Notable for the following components:   Sed Rate 71 (*)    All other components within normal limits  MAGNESIUM - Abnormal; Notable for the following components:   Magnesium 1.2 (*)    All other components within normal limits  RESP PANEL BY RT-PCR (FLU A&B, COVID) ARPGX2  URINE CULTURE  CULTURE, BLOOD (ROUTINE X 2)  CULTURE, BLOOD (ROUTINE X 2)  PROTIME-INR  APTT  PROCALCITONIN  LACTIC ACID, PLASMA  URINALYSIS, COMPLETE (UACMP) WITH MICROSCOPIC  C-REACTIVE PROTEIN  PROCALCITONIN   ____________________________________________  EKG  Sinus rhythm with a ventricular rate of 88, normal axis, unremarkable intervals no clearance of acute ischemia no significant arrhythmia. ____________________________________________  RADIOLOGY  ED MD interpretation: Chest x-ray is unremarkable for pneumonia or other acute abnormality.  Plain film left foot shows no clear evidence of acute osteomyelitis but does show soft tissue edema with some soft tissue air adjacent to the fifth metatarsal phalangeal joint concerning for acute infection.  Official radiology report(s): DG Chest Port 1 View  Result Date: 03/03/2021 CLINICAL DATA:  70 year old female with concern for sepsis. EXAM: PORTABLE CHEST 1 VIEW COMPARISON:  Chest radiograph dated 06/26/2019. FINDINGS: The lungs are clear. There is no pleural effusion pneumothorax. The cardiac silhouette is within  limits. No acute osseous pathology. IMPRESSION: No active disease. Electronically Signed   By: Anner Crete M.D.   On: 03/03/2021 19:41   DG Foot Complete Left  Result Date: 03/03/2021 CLINICAL DATA:  Questionable sepsis. Left foot cellulitis. Open wound. EXAM: LEFT FOOT - COMPLETE 3+ VIEW COMPARISON:  None. FINDINGS: Soft tissue thickening with soft tissue air adjacent to the fifth metatarsal phalangeal joint. No radiopaque foreign body. No definite subjacent bony destructive change or erosion. Hammertoe deformity of the digits. Mild pes planus and degenerative change in the midfoot. Plantar calcaneal spur and fragmented Achilles tendon enthesophyte. Advanced vascular calcifications. Soft tissue edema overlies the dorsum of the foot. IMPRESSION: 1. Soft tissue thickening with soft tissue air adjacent to the fifth metatarsophalangeal joint suspicious for soft tissue infection. Recommend correlation for site of ulcer. No radiographic findings of osteomyelitis. 2. Advanced vascular calcifications. 3. Soft tissue edema overlies the dorsum of the foot. Electronically Signed   By: Keith Rake M.D.   On: 03/03/2021 19:43    ____________________________________________   PROCEDURES  Procedure(s) performed (including Critical Care):  .1-3 Lead EKG Interpretation Performed by: Lucrezia Starch, MD Authorized by: Lucrezia Starch, MD     Interpretation: normal     ECG rate assessment: normal     Rhythm: sinus rhythm     Ectopy: none     Conduction: normal       ____________________________________________   INITIAL IMPRESSION / ASSESSMENT AND PLAN / ED  COURSE        Patient presents with above-stated history exam for assessment of redness swelling and pain on her left foot extending from the wound she has had over the last week and a half.  Seems the redness swelling pain really became noticeable in the last day or so.  On arrival her BP is slightly soft at 98/68 and she has an  elevated temperature at 100 with otherwise stable vital signs on room air.  Exam as above remarkable erythema edema tenderness of the foot with an ulcer.  She is otherwise neurovascular intact.  Suspect likely cellulitis from diabetic ulcer although certainly possible she has underlying osteomyelitis.  No fluctuance or other findings on exam to suggest abscess.  Low suspicion for DVT at this time given overall presentation much more consistent with acute infectious process and fairly symmetrical edema of the lower extremities.  Chest x-ray is not suggestive of pneumonia patient denies any urinary symptoms suggestive of other source of possible urinary tract infection.  Exam is most concerning for cellulitis from the foot and plain film is concerning for this as well.  However somewhat difficult to exclude bacteremia or possible very early sepsis at this time given lactic acid of 2.1 and elevated WBC count with borderline low blood pressure and elevated temperature on arrival.  CBC shows a WBC count of 19.3 with a hemoglobin of 10.8 at patient's baseline and left shift.  CMP remarkable for K3.4 and creatinine close to baseline with no other significant derangements.  INR is unremarkable.  ESR is elevated at 71.  Discussed with on-call hospitalist will admit to medicine service for further evaluation and management including MRI of the left foot due to osteomyelitis.  ____________________________________________   FINAL CLINICAL IMPRESSION(S) / ED DIAGNOSES  Final diagnoses:  Cellulitis of left lower extremity  Leukocytosis, unspecified type  Lactic acidosis  ESR raised  Ulcer of left foot, unspecified ulcer stage (HCC)  Hypokalemia  Hypomagnesemia    Medications  vancomycin (VANCOREADY) IVPB 2000 mg/400 mL (2,000 mg Intravenous New Bag/Given 03/03/21 1955)  ceFEPIme (MAXIPIME) 2 g in sodium chloride 0.9 % 100 mL IVPB (2 g Intravenous New Bag/Given 03/03/21 2037)  magnesium sulfate IVPB 2 g  50 mL (has no administration in time range)  lactated ringers bolus 1,000 mL (1,000 mLs Intravenous New Bag/Given 03/03/21 1931)  Tdap (BOOSTRIX) injection 0.5 mL (0.5 mLs Intramuscular Given 03/03/21 1955)  potassium chloride SA (KLOR-CON) CR tablet 40 mEq (40 mEq Oral Given 03/03/21 2034)     ED Discharge Orders    None       Note:  This document was prepared using Dragon voice recognition software and may include unintentional dictation errors.   Lucrezia Starch, MD 03/03/21 2044    Lucrezia Starch, MD 03/03/21 [id number]

## 2021-03-04 ENCOUNTER — Encounter
Admission: EM | Disposition: A | Discharge status: Home / Self Care | Payer: Self-pay | Source: Home / Self Care | Attending: Hospitalist

## 2021-03-04 ENCOUNTER — Inpatient Hospital Stay: Payer: Medicare HMO | Admitting: Anesthesiology

## 2021-03-04 ENCOUNTER — Encounter: Payer: Self-pay | Admitting: Family Medicine

## 2021-03-04 DIAGNOSIS — E11628 Type 2 diabetes mellitus with other skin complications: Secondary | ICD-10-CM | POA: Diagnosis not present

## 2021-03-04 DIAGNOSIS — M869 Osteomyelitis, unspecified: Secondary | ICD-10-CM

## 2021-03-04 DIAGNOSIS — L03119 Cellulitis of unspecified part of limb: Secondary | ICD-10-CM | POA: Diagnosis not present

## 2021-03-04 HISTORY — PX: AMPUTATION: SHX166

## 2021-03-04 LAB — BASIC METABOLIC PANEL
Anion gap: 9 (ref 5–15)
BUN: 25 mg/dL — ABNORMAL HIGH (ref 8–23)
CO2: 24 mmol/L (ref 22–32)
Calcium: 8.3 mg/dL — ABNORMAL LOW (ref 8.9–10.3)
Chloride: 104 mmol/L (ref 98–111)
Creatinine, Ser: 1.44 mg/dL — ABNORMAL HIGH (ref 0.44–1.00)
GFR, Estimated: 39 mL/min — ABNORMAL LOW (ref >60)
Glucose, Bld: 124 mg/dL — ABNORMAL HIGH (ref 70–99)
Potassium: 3.4 mmol/L — ABNORMAL LOW (ref 3.5–5.1)
Sodium: 137 mmol/L (ref 135–145)

## 2021-03-04 LAB — CBC
HCT: 29.6 % — ABNORMAL LOW (ref 36.0–46.0)
Hemoglobin: 9.7 g/dL — ABNORMAL LOW (ref 12.0–15.0)
MCH: 27.7 pg (ref 26.0–34.0)
MCHC: 32.8 g/dL (ref 30.0–36.0)
MCV: 84.6 fL (ref 80.0–100.0)
Platelets: 174 10*3/uL (ref 150–400)
RBC: 3.5 MIL/uL — ABNORMAL LOW (ref 3.87–5.11)
RDW: 14.3 % (ref 11.5–15.5)
WBC: 15 10*3/uL — ABNORMAL HIGH (ref 4.0–10.5)
nRBC: 0 % (ref 0.0–0.2)

## 2021-03-04 LAB — GLUCOSE, CAPILLARY
Glucose-Capillary: 110 mg/dL — ABNORMAL HIGH (ref 70–99)
Glucose-Capillary: 121 mg/dL — ABNORMAL HIGH (ref 70–99)
Glucose-Capillary: 139 mg/dL — ABNORMAL HIGH (ref 70–99)
Glucose-Capillary: 163 mg/dL — ABNORMAL HIGH (ref 70–99)
Glucose-Capillary: 177 mg/dL — ABNORMAL HIGH (ref 70–99)
Glucose-Capillary: 90 mg/dL (ref 70–99)

## 2021-03-04 LAB — HEMOGLOBIN A1C
Hgb A1c MFr Bld: 5.6 % (ref 4.8–5.6)
Mean Plasma Glucose: 114.02 mg/dL

## 2021-03-04 LAB — PROCALCITONIN: Procalcitonin: 0.17 ng/mL

## 2021-03-04 LAB — HIV ANTIBODY (ROUTINE TESTING W REFLEX): HIV Screen 4th Generation wRfx: NONREACTIVE

## 2021-03-04 SURGERY — AMPUTATION, FOOT, RAY
Anesthesia: General | Site: Fifth Toe | Laterality: Left

## 2021-03-04 MED ORDER — DEXAMETHASONE SODIUM PHOSPHATE 10 MG/ML IJ SOLN
INTRAMUSCULAR | Status: AC
  Filled 2021-03-04: qty 1

## 2021-03-04 MED ORDER — ONDANSETRON HCL 4 MG/2ML IJ SOLN
INTRAMUSCULAR | Status: DC | PRN
  Administered 2021-03-04: 4 mg via INTRAVENOUS

## 2021-03-04 MED ORDER — OXYCODONE HCL 5 MG PO TABS: 5.0000 mg | ORAL_TABLET | Freq: Once | ORAL | Status: DC | PRN

## 2021-03-04 MED ORDER — BUPIVACAINE HCL (PF) 0.5 % IJ SOLN
INTRAMUSCULAR | Status: AC
  Filled 2021-03-04: qty 30

## 2021-03-04 MED ORDER — VANCOMYCIN HCL 1000 MG IV SOLR
INTRAVENOUS | Status: AC
  Filled 2021-03-04: qty 1000

## 2021-03-04 MED ORDER — FENTANYL CITRATE (PF) 100 MCG/2ML IJ SOLN
INTRAMUSCULAR | Status: AC
  Filled 2021-03-04: qty 2

## 2021-03-04 MED ORDER — ACETAMINOPHEN 10 MG/ML IV SOLN: 1000.0000 mg | Freq: Once | INTRAVENOUS | Status: DC | PRN

## 2021-03-04 MED ORDER — HYDROCODONE-ACETAMINOPHEN 5-325 MG PO TABS: 1.0000 | ORAL_TABLET | ORAL | Status: DC | PRN

## 2021-03-04 MED ORDER — FENTANYL CITRATE (PF) 100 MCG/2ML IJ SOLN
INTRAMUSCULAR | Status: DC | PRN
  Administered 2021-03-04: 50 ug via INTRAVENOUS
  Administered 2021-03-04: 25 ug via INTRAVENOUS

## 2021-03-04 MED ORDER — OXYCODONE HCL 5 MG/5ML PO SOLN: 5.0000 mg | Freq: Once | ORAL | Status: DC | PRN

## 2021-03-04 MED ORDER — VANCOMYCIN HCL 1000 MG IV SOLR
INTRAVENOUS | Status: DC | PRN
  Administered 2021-03-04: 1000 mg

## 2021-03-04 MED ORDER — LIDOCAINE HCL (PF) 2 % IJ SOLN
INTRAMUSCULAR | Status: AC
  Filled 2021-03-04: qty 5

## 2021-03-04 MED ORDER — ONDANSETRON HCL 4 MG/2ML IJ SOLN: 4.0000 mg | Freq: Once | INTRAMUSCULAR | Status: DC | PRN

## 2021-03-04 MED ORDER — FENTANYL CITRATE (PF) 100 MCG/2ML IJ SOLN: 25.0000 ug | INTRAMUSCULAR | Status: DC | PRN

## 2021-03-04 MED ORDER — PROPOFOL 10 MG/ML IV BOLUS
INTRAVENOUS | Status: DC | PRN
  Administered 2021-03-04: 100 mg via INTRAVENOUS

## 2021-03-04 MED ORDER — ONDANSETRON HCL 4 MG/2ML IJ SOLN
INTRAMUSCULAR | Status: AC
  Filled 2021-03-04: qty 2

## 2021-03-04 MED ORDER — LIDOCAINE HCL (CARDIAC) PF 100 MG/5ML IV SOSY
PREFILLED_SYRINGE | INTRAVENOUS | Status: DC | PRN
  Administered 2021-03-04: 80 mg via INTRAVENOUS

## 2021-03-04 MED ORDER — PROPOFOL 10 MG/ML IV BOLUS
INTRAVENOUS | Status: AC
  Filled 2021-03-04: qty 20

## 2021-03-04 MED ORDER — MORPHINE SULFATE (PF) 2 MG/ML IV SOLN: 2.0000 mg | INTRAVENOUS | Status: DC | PRN

## 2021-03-04 MED ORDER — DEXAMETHASONE SODIUM PHOSPHATE 10 MG/ML IJ SOLN
INTRAMUSCULAR | Status: DC | PRN
  Administered 2021-03-04: 5 mg via INTRAVENOUS

## 2021-03-04 MED ORDER — MAGNESIUM SULFATE 2 GM/50ML IV SOLN
2.0000 g | INTRAVENOUS | Status: AC
  Administered 2021-03-04 (×3): 2 g via INTRAVENOUS
  Filled 2021-03-04 (×2): qty 50

## 2021-03-04 MED ORDER — BUPIVACAINE HCL 0.5 % IJ SOLN
INTRAMUSCULAR | Status: DC | PRN
  Administered 2021-03-04: 10 mL

## 2021-03-04 MED ORDER — EPHEDRINE SULFATE 50 MG/ML IJ SOLN
INTRAMUSCULAR | Status: DC | PRN
  Administered 2021-03-04 (×2): 5 mg via INTRAVENOUS

## 2021-03-04 MED ORDER — LACTATED RINGERS IV SOLN: INTRAVENOUS | Status: DC | PRN

## 2021-03-04 MED ORDER — ENOXAPARIN SODIUM 60 MG/0.6ML ~~LOC~~ SOLN
0.5000 mg/kg | SUBCUTANEOUS | Status: DC
  Administered 2021-03-05 – 2021-03-06 (×2): 42.5 mg via SUBCUTANEOUS
  Filled 2021-03-04 (×2): qty 0.6

## 2021-03-04 SURGICAL SUPPLY — 52 items
BLADE MED AGGRESSIVE (BLADE) ×1 IMPLANT
BLADE OSC/SAGITTAL MD 5.5X18 (BLADE) ×2 IMPLANT
BLADE SURG 15 STRL LF DISP TIS (BLADE) ×2 IMPLANT
BLADE SURG 15 STRL SS (BLADE) ×2
BLADE SURG MINI STRL (BLADE) ×2 IMPLANT
BNDG CONFORM 2 STRL LF (GAUZE/BANDAGES/DRESSINGS) ×1 IMPLANT
BNDG ELASTIC 4X5.8 VLCR STR LF (GAUZE/BANDAGES/DRESSINGS) ×2 IMPLANT
BNDG ESMARK 4X12 TAN STRL LF (GAUZE/BANDAGES/DRESSINGS) ×2 IMPLANT
BNDG GAUZE 4.5X4.1 6PLY STRL (MISCELLANEOUS) ×2 IMPLANT
CANISTER SUCT 1200ML W/VALVE (MISCELLANEOUS) ×1 IMPLANT
CNTNR SPEC 2.5X3XGRAD LEK (MISCELLANEOUS) ×1
CONT SPEC 4OZ STER OR WHT (MISCELLANEOUS) ×1
CONTAINER SPEC 2.5X3XGRAD LEK (MISCELLANEOUS) IMPLANT
COVER WAND RF STERILE (DRAPES) ×2 IMPLANT
CUFF TOURN SGL QUICK 12 (TOURNIQUET CUFF) ×2 IMPLANT
CUFF TOURN SGL QUICK 18X4 (TOURNIQUET CUFF) ×1 IMPLANT
DRAPE FLUOR MINI C-ARM 54X84 (DRAPES) ×1 IMPLANT
DURAPREP 26ML APPLICATOR (WOUND CARE) ×2 IMPLANT
ELECT REM PT RETURN 9FT ADLT (ELECTROSURGICAL) ×2
ELECTRODE REM PT RTRN 9FT ADLT (ELECTROSURGICAL) ×1 IMPLANT
GAUZE SPONGE 4X4 12PLY STRL (GAUZE/BANDAGES/DRESSINGS) ×3 IMPLANT
GAUZE XEROFORM 1X8 LF (GAUZE/BANDAGES/DRESSINGS) ×2 IMPLANT
GLOVE SURG ENC MOIS LTX SZ7.5 (GLOVE) ×3 IMPLANT
GLOVE SURG UNDER LTX SZ8 (GLOVE) ×3 IMPLANT
GOWN STRL REUS W/ TWL LRG LVL3 (GOWN DISPOSABLE) ×2 IMPLANT
GOWN STRL REUS W/TWL LRG LVL3 (GOWN DISPOSABLE) ×2
HANDPIECE VERSAJET DEBRIDEMENT (MISCELLANEOUS) ×2 IMPLANT
IV NS IRRIG 3000ML ARTHROMATIC (IV SOLUTION) ×2 IMPLANT
KIT STIMULAN RAPID CURE 5CC (Orthopedic Implant) ×1 IMPLANT
KIT TURNOVER KIT A (KITS) ×2 IMPLANT
LABEL OR SOLS (LABEL) ×2 IMPLANT
MANIFOLD NEPTUNE II (INSTRUMENTS) ×2 IMPLANT
NDL FILTER BLUNT 18X1 1/2 (NEEDLE) ×1 IMPLANT
NDL HYPO 25X1 1.5 SAFETY (NEEDLE) ×2 IMPLANT
NEEDLE FILTER BLUNT 18X 1/2SAF (NEEDLE)
NEEDLE FILTER BLUNT 18X1 1/2 (NEEDLE) IMPLANT
NEEDLE HYPO 25X1 1.5 SAFETY (NEEDLE) ×4 IMPLANT
NS IRRIG 500ML POUR BTL (IV SOLUTION) ×2 IMPLANT
PACK EXTREMITY ARMC (MISCELLANEOUS) ×2 IMPLANT
PAD ABD DERMACEA PRESS 5X9 (GAUZE/BANDAGES/DRESSINGS) ×1 IMPLANT
SOL PREP PVP 2OZ (MISCELLANEOUS) ×2
SOLUTION PREP PVP 2OZ (MISCELLANEOUS) ×1 IMPLANT
STOCKINETTE BIAS CUT 4 980044 (GAUZE/BANDAGES/DRESSINGS) ×2 IMPLANT
STOCKINETTE STRL 6IN 960660 (GAUZE/BANDAGES/DRESSINGS) ×2 IMPLANT
STRIP CLOSURE SKIN 1/4X4 (GAUZE/BANDAGES/DRESSINGS) ×1 IMPLANT
SUT ETHILON 3-0 FS-10 30 BLK (SUTURE)
SUT ETHILON 4-0 (SUTURE) ×1
SUT ETHILON 4-0 FS2 18XMFL BLK (SUTURE) ×1
SUTURE EHLN 3-0 FS-10 30 BLK (SUTURE) ×1 IMPLANT
SUTURE ETHLN 4-0 FS2 18XMF BLK (SUTURE) IMPLANT
SWAB CULTURE AMIES ANAERIB BLU (MISCELLANEOUS) ×2 IMPLANT
SYR 10ML LL (SYRINGE) ×2 IMPLANT

## 2021-03-04 NOTE — Op Note (Signed)
Date of operation: 03/04/2021.  Surgeon: Durward Fortes D.P.M.  Preoperative diagnosis: Cellulitis with abscess and osteomyelitis left foot.  Postoperative diagnosis: Same.  Procedure: Amputation left fifth toe with partial ray resection.  Anesthesia: LMA with local.  Hemostasis: Pneumatic tourniquet left ankle 250 mmHg.  Estimated blood loss: 5 cc.  Pathology: Left fifth ray.  Cultures: 1.  Deep wound culture abscess left foot. 2.  Bone culture fifth metatarsal head.  Implants: Stimulan rapid cure antibiotic beads impregnated with vancomycin.  Drains: None.  Complications: None apparent.  Operative indications: This is a 70 year old female with recent development of ulceration with infection in her left foot.  Presented to the hospital where she was admitted for IV antibiotics.  MRI showed early osteomyelitis in the fifth metatarsal and decision was made for fifth ray resection.  Operative procedure: Patient was taken to the operating room and placed on the table in the supine position.  Following satisfactory LMA anesthesia the left foot was anesthetized with 10 cc of 0.5% Marcaine plain.  Pneumatic tourniquet applied at the level of the left ankle and the foot was prepped and draped in the usual sterile fashion.  The foot was exsanguinated and the tourniquet inflated to 250 mmHg.     Attention was then directed to the distal aspect of the left foot where an elliptical incision was made around the base of the fifth toe both medial and lateral and the incision was then carried proximally along the dorsal aspect of the fifth metatarsal shaft.  Incision wound was carried sharply down to the level of the bone where the toe and metatarsal were freed from the normal surrounding anatomy.  Using a sagittal saw the fifth metatarsal was incised and the fifth toe and partial ray was then removed in toto.  There was noted to be some significant abscess and necrotic tissue within the lateral aspect  of the wound and this was debrided grossly using a rongeur and then thoroughly debrided using a versa jet debrider on a setting of 3.  The wound was then flushed with copious amounts of sterile saline and Stimulan rapid cure antibiotic beads impregnated with vancomycin placed within the wound and then the wound was closed using 3-0 nylon simple interrupted sutures.  A 3-0 nylon simple erupted suture placed through the plantar ulceration to prevent the beads from falling out.  Xeroform 4 x 4's ABDs and Kerlix applied to the left foot and then the tourniquet was released.  A second Kerlix and Ace wrap applied to the left lower extremity.  Patient was awakened and transported to the PACU with vital signs stable and in good condition having tolerated the procedure well.

## 2021-03-04 NOTE — Transfer of Care (Signed)
Immediate Anesthesia Transfer of Care Note  Patient: Elizabeth [name]  Procedure(s) Performed: AMPUTATION RAY (Left Fifth Toe)  Patient Location: PACU  Anesthesia Type:General  Level of Consciousness: awake, alert , oriented and patient cooperative  Airway & Oxygen Therapy: Patient Spontanous Breathing  Post-op Assessment: Report given to RN and Post -op Vital signs reviewed and stable  Post vital signs: Reviewed and stable  Last Vitals:  Vitals Value Taken Time  BP 118/47 03/04/21 2220  Temp    Pulse 76 03/04/21 2221  Resp 16 03/04/21 2221  SpO2 95 % 03/04/21 2221  Vitals shown include unvalidated device data.  Last Pain:  Vitals:   03/04/21 2036  TempSrc: Temporal  PainSc:          Complications: No complications documented.

## 2021-03-04 NOTE — Progress Notes (Signed)
[facility] Daily Progress Note   Consult received. Will plan on angiogram Friday with Dr. Delana Meyer Full consult to follow  Discussed with Dr. Eber Hong [facility] PA-C 03/04/2021 2:55 PM

## 2021-03-04 NOTE — Plan of Care (Signed)
  Problem: Activity: Goal: Risk for activity intolerance will decrease Outcome: Progressing   Problem: Nutrition: Goal: Adequate nutrition will be maintained Outcome: Progressing   

## 2021-03-04 NOTE — H&P (View-Only) (Signed)
Reason for Consult: Infected ulceration left foot Referring Physician: [name] is an 70 y.o. female.  HPI: This is a 70 year old female with history of diabetes and associated neuropathy as well as peripheral vascular disease who relates some increased redness and swelling in her left foot with recent ulceration with drainage that started a few days ago.  Does not recall any injury.  Presented to the emergency department for evaluation as she was not feeling well and was admitted for infection and sepsis.  Past Medical History:  Diagnosis Date  . Cancer [facility])    breast  . Carcinoma of right breast ([place]) 05/20/2014  . Diabetes mellitus without complication ([place])   . Hypertension   . Lymphedema   . Personal history of radiation therapy 2015   Right breast  . PONV (postoperative nausea and vomiting)   . Psoriasis 1990  . Stroke ([place]) 2000   residual left arm weakness  . Varicose veins     Past Surgical History:  Procedure Laterality Date  . AMPUTATION TOE Right 01/25/2017   Procedure: AMPUTATION TOE   ;  Surgeon: Albertine Patricia, DPM;  Location: [facility] ORS;  Service: Podiatry;  Laterality: Right;  . BREAST EXCISIONAL BIOPSY Right 2015   [facility] / with radiation  . BREAST LUMPECTOMY Right 04-2014   followed by radiation,  no chemo  . BREAST REDUCTION SURGERY  1977  . CHOLECYSTECTOMY N/A 09/23/2015   Procedure: LAPAROSCOPIC CHOLECYSTECTOMY;  Surgeon: Bonner Puna, MD;  Location: [facility] ORS;  Service: General;  Laterality: N/A;  . DILATION AND CURETTAGE OF UTERUS  2010  . EYE SURGERY Left 2015   cataracts and left eye detached retina repair  . EYE SURGERY Right 2013   cataract with len implant  . GASTRIC ROUX-EN-Y N/A 09/23/2015   Procedure: LAPAROSCOPIC ROUX-EN-Y GASTRIC;  Surgeon: Bonner Puna, MD;  Location: [facility] ORS;  Service: General;  Laterality: N/A;  . HIATAL HERNIA REPAIR N/A 09/23/2015   Procedure: LAPAROSCOPIC REPAIR OF HIATAL HERNIA;  Surgeon: Bonner Puna, MD;   Location: [facility] ORS;  Service: General;  Laterality: N/A;  . IRRIGATION AND DEBRIDEMENT FOOT Right 01/25/2017   Procedure: IRRIGATION AND DEBRIDEMENT FOOT;  Surgeon: Albertine Patricia, DPM;  Location: [facility] ORS;  Service: Podiatry;  Laterality: Right;  . IRRIGATION AND DEBRIDEMENT FOOT Right 01/29/2017   Procedure: IRRIGATION AND DEBRIDEMENT FOOT and application of wound vac;  Surgeon: Albertine Patricia, DPM;  Location: [facility] OR[facility];  Service: Podiatry;  Laterality: Right;  . LOWER EXTREMITY ANGIOGRAPHY Right 01/28/2017   Procedure: Lower Extremity Angiography;  Surgeon: Katha Cabal, MD;  Location: [facility];  Service: Cardiovascular;  Laterality: Right;  . LOWER EXTREMITY INTERVENTION  01/28/2017   Procedure: Lower Extremity Intervention;  Surgeon: Katha Cabal, MD;  Location: [facility];  Service: Cardiovascular;;  . REDUCTION MAMMAPLASTY      Family History  Problem Relation Age of Onset  . Diabetes Mother   . Cancer Mother   . Breast cancer Mother 74  . Diabetes Father   . Diabetes Sister   . Diabetes Brother     Social History:  reports that she has never smoked. She has never used smokeless tobacco. She reports that she does not drink alcohol and does not use drugs.  Allergies: No Known Allergies  Medications:  Scheduled: . calcium-vitamin D  2 tablet Oral Q breakfast  . enoxaparin (LOVENOX) injection  40 mg Subcutaneous Q24H  . gabapentin  300 mg Oral  TID  . insulin aspart  0-15 Units Subcutaneous TID PC & HS  . linagliptin  5 mg Oral Daily  . PARoxetine  40 mg Oral Daily  . potassium chloride  20 mEq Oral Daily  . pravastatin  40 mg Oral QHS    Results for orders placed or performed during the hospital encounter of 03/03/21 (from the past 48 hour(s))  Lactic acid, plasma     Status: Abnormal   Collection Time: 03/03/21  7:17 PM  Result Value Ref Range   Lactic Acid, Venous 2.1 (HH) 0.5 - 1.9 mmol/L    Comment: CRITICAL RESULT CALLED TO, READ BACK  BY AND VERIFIED WITH KELLY PENDLETON @2024  ON 03/03/21 SKL Performed at [facility], [address] [place], [place] [address]   Comprehensive metabolic panel     Status: Abnormal   Collection Time: 03/03/21  7:17 PM  Result Value Ref Range   Sodium 134 (L) 135 - 145 mmol/L   Potassium 3.4 (L) 3.5 - 5.1 mmol/L   Chloride 100 98 - 111 mmol/L   CO2 22 22 - 32 mmol/L   Glucose, Bld 145 (H) 70 - 99 mg/dL    Comment: Glucose reference range applies only to samples taken after fasting for at least 8 hours.   BUN 27 (H) 8 - 23 mg/dL   Creatinine, Ser 1.59 (H) 0.44 - 1.00 mg/dL   Calcium 8.5 (L) 8.9 - 10.3 mg/dL   Total Protein 6.7 6.5 - 8.1 g/dL   Albumin 3.2 (L) 3.5 - 5.0 g/dL   AST 14 (L) 15 - 41 U/L   ALT 10 0 - 44 U/L   Alkaline Phosphatase 73 38 - 126 U/L   Total Bilirubin 0.9 0.3 - 1.2 mg/dL   GFR, Estimated 35 (L) >60 mL/min    Comment: (NOTE) Calculated using the CKD-EPI Creatinine Equation (2021)    Anion gap 12 5 - 15    Comment: Performed at [facility], [address] [place], [place] [address]  CBC WITH DIFFERENTIAL     Status: Abnormal   Collection Time: 03/03/21  7:17 PM  Result Value Ref Range   WBC 19.3 (H) 4.0 - 10.5 K/uL   RBC 3.96 3.87 - 5.11 MIL/uL   Hemoglobin 10.8 (L) 12.0 - 15.0 g/dL   HCT 33.7 (L) 36.0 - 46.0 %   MCV 85.1 80.0 - 100.0 fL   MCH 27.3 26.0 - 34.0 pg   MCHC 32.0 30.0 - 36.0 g/dL   RDW 14.3 11.5 - 15.5 %   Platelets 202 150 - 400 K/uL   nRBC 0.0 0.0 - 0.2 %   Neutrophils Relative % 88 %   Neutro Abs 16.8 (H) 1.7 - 7.7 K/uL   Lymphocytes Relative 5 %   Lymphs Abs 1.1 0.7 - 4.0 K/uL   Monocytes Relative 7 %   Monocytes Absolute 1.4 (H) 0.1 - 1.0 K/uL   Eosinophils Relative 0 %   Eosinophils Absolute 0.0 0.0 - 0.5 K/uL   Basophils Relative 0 %   Basophils Absolute 0.1 0.0 - 0.1 K/uL   Immature Granulocytes 0 %   Abs Immature Granulocytes 0.07 0.00 - 0.07 K/uL    Comment: Performed at [facility], [address] [place],  [address]  Protime-INR     Status: None   Collection Time: 03/03/21  7:17 PM  Result Value Ref Range   Prothrombin Time 14.9 11.4 - 15.2 seconds   INR 1.2 0.8 - 1.2  Comment: (NOTE) INR goal varies based on device and disease states. Performed at [facility], [address] [place], [place] [address]   APTT     Status: None   Collection Time: 03/03/21  7:17 PM  Result Value Ref Range   aPTT 31 24 - 36 seconds    Comment: Performed at [facility], [address] [place], [place] [address]  Sedimentation rate     Status: Abnormal   Collection Time: 03/03/21  7:17 PM  Result Value Ref Range   Sed Rate 71 (H) 0 - 30 mm/hr    Comment: Performed at [facility], [address] [place], [place] [address]  C-reactive protein     Status: Abnormal   Collection Time: 03/03/21  7:17 PM  Result Value Ref Range   CRP 12.5 (H) <1.0 mg/dL    Comment: Performed at [facility], [place] [address] [place], [place] [address]  Procalcitonin - Baseline     Status: None   Collection Time: 03/03/21  7:17 PM  Result Value Ref Range   Procalcitonin 0.10 ng/mL    Comment:        Interpretation: PCT (Procalcitonin) <= 0.5 ng/mL: Systemic infection (sepsis) is not likely. Local bacterial infection is possible. (NOTE)       Sepsis PCT Algorithm           Lower Respiratory Tract                                      Infection PCT Algorithm    ----------------------------     ----------------------------         PCT < 0.25 ng/mL                PCT < 0.10 ng/mL          Strongly encourage             Strongly discourage   discontinuation of antibiotics    initiation of antibiotics    ----------------------------     -----------------------------       PCT 0.25 - 0.50 ng/mL            PCT 0.10 - 0.25 ng/mL               OR       >80% decrease in PCT            Discourage initiation of                                             antibiotics      Encourage discontinuation           of antibiotics    ----------------------------     -----------------------------         PCT >= 0.50 ng/mL              PCT 0.26 - 0.50 ng/mL               AND        <80% decrease in PCT             Encourage initiation of  antibiotics       Encourage continuation           of antibiotics    ----------------------------     -----------------------------        PCT >= 0.50 ng/mL                  PCT > 0.50 ng/mL               AND         increase in PCT                  Strongly encourage                                      initiation of antibiotics    Strongly encourage escalation           of antibiotics                                     -----------------------------                                           PCT <= 0.25 ng/mL                                                 OR                                        > 80% decrease in PCT                                      Discontinue / Do not initiate                                             antibiotics  Performed at [facility], [address] [place], [place] [address]   Blood culture (routine x 2)     Status: None (Preliminary result)   Collection Time: 03/03/21  7:17 PM   Specimen: BLOOD  Result Value Ref Range   Specimen Description BLOOD RIGHT ANTECUBITAL    Special Requests      BOTTLES DRAWN AEROBIC AND ANAEROBIC Blood Culture adequate volume   Culture      NO GROWTH < 12 HOURS Performed at [facility], [address] [place], [place] [address]    Report Status PENDING   Blood culture (routine x 2)     Status: None (Preliminary result)   Collection Time: 03/03/21  7:17 PM   Specimen: BLOOD  Result Value Ref Range   Specimen Description BLOOD BLOOD RIGHT ARM    Special Requests      BOTTLES DRAWN AEROBIC AND ANAEROBIC Blood Culture adequate volume   Culture      NO GROWTH <  12  HOURS Performed at [facility], [address] [place], [place] [address]    Report Status PENDING   Resp Panel by RT-PCR (Flu A&B, Covid) Nasopharyngeal Swab     Status: None   Collection Time: 03/03/21  7:17 PM   Specimen: Nasopharyngeal Swab; Nasopharyngeal(NP) swabs in vial transport medium  Result Value Ref Range   SARS Coronavirus 2 by RT PCR NEGATIVE NEGATIVE    Comment: (NOTE) SARS-CoV-2 target nucleic acids are NOT DETECTED.  The SARS-CoV-2 RNA is generally detectable in upper respiratory specimens during the acute phase of infection. The lowest concentration of SARS-CoV-2 viral copies this assay can detect is 138 copies/mL. A negative result does not preclude SARS-Cov-2 infection and should not be used as the sole basis for treatment or other patient management decisions. A negative result may occur with  improper specimen collection/handling, submission of specimen other than nasopharyngeal swab, presence of viral mutation(s) within the areas targeted by this assay, and inadequate number of viral copies(<138 copies/mL). A negative result must be combined with clinical observations, patient history, and epidemiological information. The expected result is Negative.  Fact Sheet for Patients:  https://www.fda.gov/media/152166/download  Fact Sheet for Healthcare Providers:  https://www.fda.gov/media/152162/download  This test is no t yet approved or cleared by the [place] FDA and  has been authorized for detection and/or diagnosis of SARS-CoV-2 by FDA under an Emergency Use Authorization (EUA). This EUA will remain  in effect (meaning this test can be used) for the duration of the COVID-19 declaration under Section 564(b)(1) of the Act, 21 U.S.C.section 360bbb-3(b)(1), unless the authorization is terminated  or revoked sooner.       Influenza A by PCR NEGATIVE NEGATIVE   Influenza B by PCR NEGATIVE NEGATIVE    Comment: (NOTE) The Xpert Xpress  SARS-CoV-2/FLU/RSV plus assay is intended as an aid in the diagnosis of influenza from Nasopharyngeal swab specimens and should not be used as a sole basis for treatment. Nasal washings and aspirates are unacceptable for Xpert Xpress SARS-CoV-2/FLU/RSV testing.  Fact Sheet for Patients: https://www.fda.gov/media/152166/download  Fact Sheet for Healthcare Providers: https://www.fda.gov/media/152162/download  This test is not yet approved or cleared by the [place] FDA and has been authorized for detection and/or diagnosis of SARS-CoV-2 by FDA under an Emergency Use Authorization (EUA). This EUA will remain in effect (meaning this test can be used) for the duration of the COVID-19 declaration under Section 564(b)(1) of the Act, 21 U.S.C. section 360bbb-3(b)(1), unless the authorization is terminated or revoked.  Performed at [facility], [address] [place], [place] [address]   Magnesium     Status: Abnormal   Collection Time: 03/03/21  7:17 PM  Result Value Ref Range   Magnesium 1.2 (L) 1.7 - 2.4 mg/dL    Comment: Performed at [facility], [address] [place], [place] [address]  Lactic acid, plasma     Status: None   Collection Time: 03/03/21  9:10 PM  Result Value Ref Range   Lactic Acid, Venous 1.4 0.5 - 1.9 mmol/L    Comment: Performed at [facility], [address] , [place] [address]  Urinalysis, Complete w Microscopic     Status: Abnormal   Collection Time: 03/03/21  9:10 PM  Result Value Ref Range   Color, Urine YELLOW (A) YELLOW   APPearance HAZY (A) CLEAR   Specific Gravity, Urine 1.009 1.005 - 1.030   pH 5.0 5.0 - 8.0   Glucose, UA NEGATIVE NEGATIVE mg/dL     Hgb urine dipstick SMALL (A) NEGATIVE   Bilirubin Urine NEGATIVE NEGATIVE   Ketones, ur NEGATIVE NEGATIVE mg/dL   Protein, ur NEGATIVE NEGATIVE mg/dL   Nitrite NEGATIVE NEGATIVE   Leukocytes,Ua LARGE (A) NEGATIVE   WBC, UA 0-5 0 - 5 WBC/hpf   Bacteria,  UA MANY (A) NONE SEEN   Squamous Epithelial / LPF 0-5 0 - 5   Hyaline Casts, UA PRESENT     Comment: Performed at [facility], [address] [place], [place] [address]  Glucose, capillary     Status: Abnormal   Collection Time: 03/04/21  1:00 AM  Result Value Ref Range   Glucose-Capillary 139 (H) 70 - 99 mg/dL    Comment: Glucose reference range applies only to samples taken after fasting for at least 8 hours.   Comment 1 Notify RN   Procalcitonin     Status: None   Collection Time: 03/04/21  5:04 AM  Result Value Ref Range   Procalcitonin 0.17 ng/mL    Comment:        Interpretation: PCT (Procalcitonin) <= 0.5 ng/mL: Systemic infection (sepsis) is not likely. Local bacterial infection is possible. (NOTE)       Sepsis PCT Algorithm           Lower Respiratory Tract                                      Infection PCT Algorithm    ----------------------------     ----------------------------         PCT < 0.25 ng/mL                PCT < 0.10 ng/mL          Strongly encourage             Strongly discourage   discontinuation of antibiotics    initiation of antibiotics    ----------------------------     -----------------------------       PCT 0.25 - 0.50 ng/mL            PCT 0.10 - 0.25 ng/mL               OR       >80% decrease in PCT            Discourage initiation of                                            antibiotics      Encourage discontinuation           of antibiotics    ----------------------------     -----------------------------         PCT >= 0.50 ng/mL              PCT 0.26 - 0.50 ng/mL               AND        <80% decrease in PCT             Encourage initiation of  antibiotics       Encourage continuation           of antibiotics    ----------------------------     -----------------------------        PCT >= 0.50 ng/mL                  PCT > 0.50 ng/mL               AND         increase in PCT                   Strongly encourage                                      initiation of antibiotics    Strongly encourage escalation           of antibiotics                                     -----------------------------                                           PCT <= 0.25 ng/mL                                                 OR                                        > 80% decrease in PCT                                      Discontinue / Do not initiate                                             antibiotics  Performed at [facility], [address] [place], [place] [address]   Basic metabolic panel     Status: Abnormal   Collection Time: 03/04/21  5:04 AM  Result Value Ref Range   Sodium 137 135 - 145 mmol/L   Potassium 3.4 (L) 3.5 - 5.1 mmol/L   Chloride 104 98 - 111 mmol/L   CO2 24 22 - 32 mmol/L   Glucose, Bld 124 (H) 70 - 99 mg/dL    Comment: Glucose reference range applies only to samples taken after fasting for at least 8 hours.   BUN 25 (H) 8 - 23 mg/dL   Creatinine, Ser 1.44 (H) 0.44 - 1.00 mg/dL   Calcium 8.3 (L) 8.9 - 10.3 mg/dL   GFR, Estimated 39 (L) >60 mL/min    Comment: (NOTE) Calculated using the CKD-EPI Creatinine Equation (2021)    Anion gap 9 5 - 15    Comment: Performed at [facility], [address] [place], [place]  [address]  CBC     Status: Abnormal   Collection Time: 03/04/21  5:04 AM  Result Value Ref Range   WBC 15.0 (H) 4.0 - 10.5 K/uL   RBC 3.50 (L) 3.87 - 5.11 MIL/uL   Hemoglobin 9.7 (L) 12.0 - 15.0 g/dL   HCT 29.6 (L) 36.0 - 46.0 %   MCV 84.6 80.0 - 100.0 fL   MCH 27.7 26.0 - 34.0 pg   MCHC 32.8 30.0 - 36.0 g/dL   RDW 14.3 11.5 - 15.5 %   Platelets 174 150 - 400 K/uL   nRBC 0.0 0.0 - 0.2 %    Comment: Performed at [facility], [address] [place], [place] [address]  Glucose, capillary     Status: None   Collection Time: 03/04/21  7:56 AM  Result Value Ref Range   Glucose-Capillary 90 70 - 99 mg/dL     Comment: Glucose reference range applies only to samples taken after fasting for at least 8 hours.    MR FOOT LEFT WO CONTRAST  Result Date: 03/04/2021 CLINICAL DATA:  Diabetic foot ulcer EXAM: MRI OF THE LEFT FOOT WITHOUT CONTRAST TECHNIQUE: Multiplanar, multisequence MR imaging of the left forefoot was performed. No intravenous contrast was administered. COMPARISON:  X-ray 03/03/2021 FINDINGS: Bones/Joint/Cartilage Bone marrow edema within the fifth metatarsal head and base of the fifth toe proximal phalanx (series 8, images 13-17) with subtle intermediate T1 marrow signal relative to the adjacent bony structures. No erosion. Trace fifth MTP joint effusion. Remaining osseous structures are intact. No fracture or dislocation. Mild degenerative changes within the left forefoot including the first MTP joint and tarsometatarsal joints. Ligaments Intact Lisfranc ligament. Collateral ligaments of the forefoot appear intact. Muscles and Tendons Denervation changes of the intrinsic foot musculature. Mild diffuse edema-like signal throughout the musculature which could reflect a component of myositis. Intact flexor and extensor tendons. No tenosynovial fluid collection. Soft tissues Soft tissue swelling and edema centered at the level of the fifth MTP joint. There are a few scattered areas of susceptibility artifact within the soft tissues corresponding to air seen on previous radiographs (series 3, images 27 and 29). No organized or drainable fluid collection. Prominent subcutaneous edema overlies the dorsum of the foot. IMPRESSION: 1. Bone marrow edema within the fifth metatarsal head and base of the fifth toe proximal phalanx with subtle T1 marrow signal changes. Findings are favored to represent early acute osteomyelitis. 2. Findings of cellulitis centered at the level of the fifth MTP joint. There are a few scattered areas of susceptibility artifact within the soft tissues corresponding to air seen on previous  radiographs. No organized or drainable fluid collection. 3. Mild diffuse edema-like signal throughout the musculature which may reflect denervation changes and/or myositis. Electronically Signed   By: Davina Poke D.O.   On: 03/04/2021 08:26   DG Chest Port 1 View  Result Date: 03/03/2021 CLINICAL DATA:  70 year old female with concern for sepsis. EXAM: PORTABLE CHEST 1 VIEW COMPARISON:  Chest radiograph dated 06/26/2019. FINDINGS: The lungs are clear. There is no pleural effusion pneumothorax. The cardiac silhouette is within limits. No acute osseous pathology. IMPRESSION: No active disease. Electronically Signed   By: Anner Crete M.D.   On: 03/03/2021 19:41   DG Foot Complete Left  Result Date: 03/03/2021 CLINICAL DATA:  Questionable sepsis. Left foot cellulitis. Open wound. EXAM: LEFT FOOT - COMPLETE 3+ VIEW COMPARISON:  None. FINDINGS: Soft tissue thickening with soft tissue air adjacent to the fifth metatarsal phalangeal joint.  No radiopaque foreign body. No definite subjacent bony destructive change or erosion. Hammertoe deformity of the digits. Mild pes planus and degenerative change in the midfoot. Plantar calcaneal spur and fragmented Achilles tendon enthesophyte. Advanced vascular calcifications. Soft tissue edema overlies the dorsum of the foot. IMPRESSION: 1. Soft tissue thickening with soft tissue air adjacent to the fifth metatarsophalangeal joint suspicious for soft tissue infection. Recommend correlation for site of ulcer. No radiographic findings of osteomyelitis. 2. Advanced vascular calcifications. 3. Soft tissue edema overlies the dorsum of the foot. Electronically Signed   By: Keith Rake M.D.   On: 03/03/2021 19:43    Review of Systems  Constitutional: Negative for chills and fever.  HENT: Negative for congestion and sore throat.   Respiratory: Negative for cough and shortness of breath.   Cardiovascular: Negative for chest pain and palpitations.  Gastrointestinal:  Negative for nausea and vomiting.  Endocrine: Negative for polydipsia and polyuria.  Genitourinary: Negative for frequency and urgency.  Musculoskeletal:       Patient relates previous amputation of her right fifth toe.  No significant pain.  Skin:       Increased redness and swelling in the left foot with some drainage from a sore on her left foot over the last couple of days.  Neurological:       Patient relates significant diabetic neuropathy.  Psychiatric/Behavioral: Negative for behavioral problems. The patient is not nervous/anxious.    Blood pressure 129/75, pulse 71, temperature 98.7 F (37.1 C), temperature source Oral, resp. rate 15, height 5\' 3"  (1.6 m), weight 86.2 kg, SpO2 100 %. Physical Exam Cardiovascular:     Comments: DP and PT pulses on the left are difficult to palpate, trace at best.  DP palpable on the right with trace PT. Musculoskeletal:     Comments: Previous fifth ray resection on the right foot.  Adequate range of motion of the pedal joints.  Muscle testing deferred.  Skin:    Comments: Significant edema in the left lower extremity as compared to the right.  Focal erythema along the dorsal and lateral aspect of the left foot with a full-thickness plantar ulceration.  No expressible purulence noted.  Neurological:     Comments: Loss of protective threshold with a monofilament wire bilateral.         Assessment/Plan: Assessment: 1.  Full-thickness ulceration left foot with cellulitis. 2.  Diabetes with associated neuropathy. 3.  Osteomyelitis left fifth ray.  Plan: Discussed with the patient the need for amputation of the fifth toe with ray resection as previously performed on the right foot.  Discussed possible risks and complications of the procedure including inability to heal due to continued infection, her diabetes, or her vascular status.  Discussed that no guarantees could be given as to the outcome of surgery.  We will obtain consent form for  amputation left fifth toe with partial ray resection.  N.p.o. after breakfast this morning.  Also discussed with vascular surgery and we will consult for possible intervention later this week.  Plan for surgery later this afternoon.  Durward Fortes 03/04/2021, 8:31 AM

## 2021-03-04 NOTE — Progress Notes (Signed)
PHARMACIST - PHYSICIAN COMMUNICATION  CONCERNING:  Enoxaparin (Lovenox) for DVT Prophylaxis    RECOMMENDATION: Patient was prescribed enoxaprin 40mg  q24 hours for VTE prophylaxis.   Filed Weights   03/03/21 1848  Weight: 86.2 kg (190 lb)    Body mass index is 33.66 kg/m.  Estimated Creatinine Clearance: 38.4 mL/min (A) (by C-G formula based on SCr of 1.44 mg/dL (H)).   Based on [place] patient is candidate for enoxaparin 0.5mg /kg TBW SQ every 24 hours based on BMI being >30.   DESCRIPTION: Pharmacy has adjusted enoxaparin dose per [facility] policy.  Patient is now receiving enoxaparin 42.5 mg every 24 hours    Pearla Dubonnet, PharmD Clinical Pharmacist  03/04/2021 1:50 PM

## 2021-03-04 NOTE — Anesthesia Procedure Notes (Signed)
Procedure Name: LMA Insertion Date/Time: 03/04/2021 9:28 PM Performed by: Lendon Colonel, CRNA Pre-anesthesia Checklist: Patient identified, Patient being monitored, Timeout performed, Emergency Drugs available and Suction available Patient Re-evaluated:Patient Re-evaluated prior to induction Oxygen Delivery Method: Circle system utilized Preoxygenation: Pre-oxygenation with 100% oxygen Induction Type: IV induction Ventilation: Mask ventilation with difficulty LMA: LMA inserted LMA Size: 4.0 Tube type: Oral Number of attempts: 1 Placement Confirmation: positive ETCO2 and breath sounds checked- equal and bilateral Tube secured with: Tape Dental Injury: Teeth and Oropharynx as per pre-operative assessment

## 2021-03-04 NOTE — Consult Note (Signed)
Reason for Consult: Infected ulceration left foot Referring Physician: Zitlali Keebler is an 70 y.o. female.  HPI: This is a 70 year old female with history of diabetes and associated neuropathy as well as peripheral vascular disease who relates some increased redness and swelling in her left foot with recent ulceration with drainage that started a few days ago.  Does not recall any injury.  Presented to the emergency department for evaluation as she was not feeling well and was admitted for infection and sepsis.  Past Medical History:  Diagnosis Date  . Cancer [facility])    breast  . Carcinoma of right breast ([place]) 05/20/2014  . Diabetes mellitus without complication ([place])   . Hypertension   . Lymphedema   . Personal history of radiation therapy 2015   Right breast  . PONV (postoperative nausea and vomiting)   . Psoriasis 1990  . Stroke ([place]) 2000   residual left arm weakness  . Varicose veins     Past Surgical History:  Procedure Laterality Date  . AMPUTATION TOE Right 01/25/2017   Procedure: AMPUTATION TOE   ;  Surgeon: Albertine Patricia, DPM;  Location: [facility] ORS;  Service: Podiatry;  Laterality: Right;  . BREAST EXCISIONAL BIOPSY Right 2015   [facility] / with radiation  . BREAST LUMPECTOMY Right 04-2014   followed by radiation,  no chemo  . BREAST REDUCTION SURGERY  1977  . CHOLECYSTECTOMY N/A 09/23/2015   Procedure: LAPAROSCOPIC CHOLECYSTECTOMY;  Surgeon: Bonner Puna, MD;  Location: [facility] ORS;  Service: General;  Laterality: N/A;  . DILATION AND CURETTAGE OF UTERUS  2010  . EYE SURGERY Left 2015   cataracts and left eye detached retina repair  . EYE SURGERY Right 2013   cataract with len implant  . GASTRIC ROUX-EN-Y N/A 09/23/2015   Procedure: LAPAROSCOPIC ROUX-EN-Y GASTRIC;  Surgeon: Bonner Puna, MD;  Location: [facility] ORS;  Service: General;  Laterality: N/A;  . HIATAL HERNIA REPAIR N/A 09/23/2015   Procedure: LAPAROSCOPIC REPAIR OF HIATAL HERNIA;  Surgeon: Bonner Puna, MD;   Location: [facility] ORS;  Service: General;  Laterality: N/A;  . IRRIGATION AND DEBRIDEMENT FOOT Right 01/25/2017   Procedure: IRRIGATION AND DEBRIDEMENT FOOT;  Surgeon: Albertine Patricia, DPM;  Location: [facility] ORS;  Service: Podiatry;  Laterality: Right;  . IRRIGATION AND DEBRIDEMENT FOOT Right 01/29/2017   Procedure: IRRIGATION AND DEBRIDEMENT FOOT and application of wound vac;  Surgeon: Albertine Patricia, DPM;  Location: [facility] OR[facility];  Service: Podiatry;  Laterality: Right;  . LOWER EXTREMITY ANGIOGRAPHY Right 01/28/2017   Procedure: Lower Extremity Angiography;  Surgeon: Katha Cabal, MD;  Location: [place] [facility];  Service: Cardiovascular;  Laterality: Right;  . LOWER EXTREMITY INTERVENTION  01/28/2017   Procedure: Lower Extremity Intervention;  Surgeon: Katha Cabal, MD;  Location: [place] [facility];  Service: Cardiovascular;;  . REDUCTION MAMMAPLASTY      Family History  Problem Relation Age of Onset  . Diabetes Mother   . Cancer Mother   . Breast cancer Mother 83  . Diabetes Father   . Diabetes Sister   . Diabetes Brother     Social History:  reports that she has never smoked. She has never used smokeless tobacco. She reports that she does not drink alcohol and does not use drugs.  Allergies: No Known Allergies  Medications:  Scheduled: . calcium-vitamin D  2 tablet Oral Q breakfast  . enoxaparin (LOVENOX) injection  40 mg Subcutaneous Q24H  . gabapentin  300 mg Oral  TID  . insulin aspart  0-15 Units Subcutaneous TID PC & HS  . linagliptin  5 mg Oral Daily  . PARoxetine  40 mg Oral Daily  . potassium chloride  20 mEq Oral Daily  . pravastatin  40 mg Oral QHS    Results for orders placed or performed during the hospital encounter of 03/03/21 (from the past 48 hour(s))  Lactic acid, plasma     Status: Abnormal   Collection Time: 03/03/21  7:17 PM  Result Value Ref Range   Lactic Acid, Venous 2.1 (HH) 0.5 - 1.9 mmol/L    Comment: CRITICAL RESULT CALLED TO, READ BACK  BY AND VERIFIED WITH KELLY PENDLETON @2024  ON 03/03/21 SKL Performed at [facility], [address] [place], [place] [address]   Comprehensive metabolic panel     Status: Abnormal   Collection Time: 03/03/21  7:17 PM  Result Value Ref Range   Sodium 134 (L) 135 - 145 mmol/L   Potassium 3.4 (L) 3.5 - 5.1 mmol/L   Chloride 100 98 - 111 mmol/L   CO2 22 22 - 32 mmol/L   Glucose, Bld 145 (H) 70 - 99 mg/dL    Comment: Glucose reference range applies only to samples taken after fasting for at least 8 hours.   BUN 27 (H) 8 - 23 mg/dL   Creatinine, Ser 1.59 (H) 0.44 - 1.00 mg/dL   Calcium 8.5 (L) 8.9 - 10.3 mg/dL   Total Protein 6.7 6.5 - 8.1 g/dL   Albumin 3.2 (L) 3.5 - 5.0 g/dL   AST 14 (L) 15 - 41 U/L   ALT 10 0 - 44 U/L   Alkaline Phosphatase 73 38 - 126 U/L   Total Bilirubin 0.9 0.3 - 1.2 mg/dL   GFR, Estimated 35 (L) >60 mL/min    Comment: (NOTE) Calculated using the CKD-EPI Creatinine Equation (2021)    Anion gap 12 5 - 15    Comment: Performed at [facility], [address] [place], [place] [address]  CBC WITH DIFFERENTIAL     Status: Abnormal   Collection Time: 03/03/21  7:17 PM  Result Value Ref Range   WBC 19.3 (H) 4.0 - 10.5 K/uL   RBC 3.96 3.87 - 5.11 MIL/uL   Hemoglobin 10.8 (L) 12.0 - 15.0 g/dL   HCT 33.7 (L) 36.0 - 46.0 %   MCV 85.1 80.0 - 100.0 fL   MCH 27.3 26.0 - 34.0 pg   MCHC 32.0 30.0 - 36.0 g/dL   RDW 14.3 11.5 - 15.5 %   Platelets 202 150 - 400 K/uL   nRBC 0.0 0.0 - 0.2 %   Neutrophils Relative % 88 %   Neutro Abs 16.8 (H) 1.7 - 7.7 K/uL   Lymphocytes Relative 5 %   Lymphs Abs 1.1 0.7 - 4.0 K/uL   Monocytes Relative 7 %   Monocytes Absolute 1.4 (H) 0.1 - 1.0 K/uL   Eosinophils Relative 0 %   Eosinophils Absolute 0.0 0.0 - 0.5 K/uL   Basophils Relative 0 %   Basophils Absolute 0.1 0.0 - 0.1 K/uL   Immature Granulocytes 0 %   Abs Immature Granulocytes 0.07 0.00 - 0.07 K/uL    Comment: Performed at [facility], [address] [place], [place] [address]  Protime-INR     Status: None   Collection Time: 03/03/21  7:17 PM  Result Value Ref Range   Prothrombin Time 14.9 11.4 - 15.2 seconds   INR 1.2 0.8 - 1.2  Comment: (NOTE) INR goal varies based on device and disease states. Performed at [facility], [address] [place], [place] [address]   APTT     Status: None   Collection Time: 03/03/21  7:17 PM  Result Value Ref Range   aPTT 31 24 - 36 seconds    Comment: Performed at [facility], [address] [place], [place] [address]  Sedimentation rate     Status: Abnormal   Collection Time: 03/03/21  7:17 PM  Result Value Ref Range   Sed Rate 71 (H) 0 - 30 mm/hr    Comment: Performed at [facility], [address] [place], [place] [address]  C-reactive protein     Status: Abnormal   Collection Time: 03/03/21  7:17 PM  Result Value Ref Range   CRP 12.5 (H) <1.0 mg/dL    Comment: Performed at [facility], [place] [address] [place], [place] [address]  Procalcitonin - Baseline     Status: None   Collection Time: 03/03/21  7:17 PM  Result Value Ref Range   Procalcitonin 0.10 ng/mL    Comment:        Interpretation: PCT (Procalcitonin) <= 0.5 ng/mL: Systemic infection (sepsis) is not likely. Local bacterial infection is possible. (NOTE)       Sepsis PCT Algorithm           Lower Respiratory Tract                                      Infection PCT Algorithm    ----------------------------     ----------------------------         PCT < 0.25 ng/mL                PCT < 0.10 ng/mL          Strongly encourage             Strongly discourage   discontinuation of antibiotics    initiation of antibiotics    ----------------------------     -----------------------------       PCT 0.25 - 0.50 ng/mL            PCT 0.10 - 0.25 ng/mL               OR       >80% decrease in PCT            Discourage initiation of                                             antibiotics      Encourage discontinuation           of antibiotics    ----------------------------     -----------------------------         PCT >= 0.50 ng/mL              PCT 0.26 - 0.50 ng/mL               AND        <80% decrease in PCT             Encourage initiation of  antibiotics       Encourage continuation           of antibiotics    ----------------------------     -----------------------------        PCT >= 0.50 ng/mL                  PCT > 0.50 ng/mL               AND         increase in PCT                  Strongly encourage                                      initiation of antibiotics    Strongly encourage escalation           of antibiotics                                     -----------------------------                                           PCT <= 0.25 ng/mL                                                 OR                                        > 80% decrease in PCT                                      Discontinue / Do not initiate                                             antibiotics  Performed at [facility], [address] [place], [place] [address]   Blood culture (routine x 2)     Status: None (Preliminary result)   Collection Time: 03/03/21  7:17 PM   Specimen: BLOOD  Result Value Ref Range   Specimen Description BLOOD RIGHT ANTECUBITAL    Special Requests      BOTTLES DRAWN AEROBIC AND ANAEROBIC Blood Culture adequate volume   Culture      NO GROWTH < 12 HOURS Performed at [facility], [address] [place], [place] [address]    Report Status PENDING   Blood culture (routine x 2)     Status: None (Preliminary result)   Collection Time: 03/03/21  7:17 PM   Specimen: BLOOD  Result Value Ref Range   Specimen Description BLOOD BLOOD RIGHT ARM    Special Requests      BOTTLES DRAWN AEROBIC AND ANAEROBIC Blood Culture adequate volume   Culture      NO GROWTH <  12  HOURS Performed at [facility], [address] [place], [place] [address]    Report Status PENDING   Resp Panel by RT-PCR (Flu A&B, Covid) Nasopharyngeal Swab     Status: None   Collection Time: 03/03/21  7:17 PM   Specimen: Nasopharyngeal Swab; Nasopharyngeal(NP) swabs in vial transport medium  Result Value Ref Range   SARS Coronavirus 2 by RT PCR NEGATIVE NEGATIVE    Comment: (NOTE) SARS-CoV-2 target nucleic acids are NOT DETECTED.  The SARS-CoV-2 RNA is generally detectable in upper respiratory specimens during the acute phase of infection. The lowest concentration of SARS-CoV-2 viral copies this assay can detect is 138 copies/mL. A negative result does not preclude SARS-Cov-2 infection and should not be used as the sole basis for treatment or other patient management decisions. A negative result may occur with  improper specimen collection/handling, submission of specimen other than nasopharyngeal swab, presence of viral mutation(s) within the areas targeted by this assay, and inadequate number of viral copies(<138 copies/mL). A negative result must be combined with clinical observations, patient history, and epidemiological information. The expected result is Negative.  Fact Sheet for Patients:  EntrepreneurPulse.com.au  Fact Sheet for Healthcare Providers:  IncredibleEmployment.be  This test is no t yet approved or cleared by the [place] FDA and  has been authorized for detection and/or diagnosis of SARS-CoV-2 by FDA under an Emergency Use Authorization (EUA). This EUA will remain  in effect (meaning this test can be used) for the duration of the COVID-19 declaration under Section 564(b)(1) of the Act, 21 U.S.C.section 360bbb-3(b)(1), unless the authorization is terminated  or revoked sooner.       Influenza A by PCR NEGATIVE NEGATIVE   Influenza B by PCR NEGATIVE NEGATIVE    Comment: (NOTE) The Xpert Xpress  SARS-CoV-2/FLU/RSV plus assay is intended as an aid in the diagnosis of influenza from Nasopharyngeal swab specimens and should not be used as a sole basis for treatment. Nasal washings and aspirates are unacceptable for Xpert Xpress SARS-CoV-2/FLU/RSV testing.  Fact Sheet for Patients: EntrepreneurPulse.com.au  Fact Sheet for Healthcare Providers: IncredibleEmployment.be  This test is not yet approved or cleared by the [place] FDA and has been authorized for detection and/or diagnosis of SARS-CoV-2 by FDA under an Emergency Use Authorization (EUA). This EUA will remain in effect (meaning this test can be used) for the duration of the COVID-19 declaration under Section 564(b)(1) of the Act, 21 U.S.C. section 360bbb-3(b)(1), unless the authorization is terminated or revoked.  Performed at [facility], [address] [place], [place] [address]   Magnesium     Status: Abnormal   Collection Time: 03/03/21  7:17 PM  Result Value Ref Range   Magnesium 1.2 (L) 1.7 - 2.4 mg/dL    Comment: Performed at [facility], [address] [place], [place] [address]  Lactic acid, plasma     Status: None   Collection Time: 03/03/21  9:10 PM  Result Value Ref Range   Lactic Acid, Venous 1.4 0.5 - 1.9 mmol/L    Comment: Performed at [facility], [address] [place], [place] [address]  Urinalysis, Complete w Microscopic     Status: Abnormal   Collection Time: 03/03/21  9:10 PM  Result Value Ref Range   Color, Urine YELLOW (A) YELLOW   APPearance HAZY (A) CLEAR   Specific Gravity, Urine 1.009 1.005 - 1.030   pH 5.0 5.0 - 8.0   Glucose, UA NEGATIVE NEGATIVE mg/dL  Hgb urine dipstick SMALL (A) NEGATIVE   Bilirubin Urine NEGATIVE NEGATIVE   Ketones, ur NEGATIVE NEGATIVE mg/dL   Protein, ur NEGATIVE NEGATIVE mg/dL   Nitrite NEGATIVE NEGATIVE   Leukocytes,Ua LARGE (A) NEGATIVE   WBC, UA 0-5 0 - 5 WBC/hpf   Bacteria,  UA MANY (A) NONE SEEN   Squamous Epithelial / LPF 0-5 0 - 5   Hyaline Casts, UA PRESENT     Comment: Performed at [facility], [address] [place], [place] [address]  Glucose, capillary     Status: Abnormal   Collection Time: 03/04/21  1:00 AM  Result Value Ref Range   Glucose-Capillary 139 (H) 70 - 99 mg/dL    Comment: Glucose reference range applies only to samples taken after fasting for at least 8 hours.   Comment 1 Notify RN   Procalcitonin     Status: None   Collection Time: 03/04/21  5:04 AM  Result Value Ref Range   Procalcitonin 0.17 ng/mL    Comment:        Interpretation: PCT (Procalcitonin) <= 0.5 ng/mL: Systemic infection (sepsis) is not likely. Local bacterial infection is possible. (NOTE)       Sepsis PCT Algorithm           Lower Respiratory Tract                                      Infection PCT Algorithm    ----------------------------     ----------------------------         PCT < 0.25 ng/mL                PCT < 0.10 ng/mL          Strongly encourage             Strongly discourage   discontinuation of antibiotics    initiation of antibiotics    ----------------------------     -----------------------------       PCT 0.25 - 0.50 ng/mL            PCT 0.10 - 0.25 ng/mL               OR       >80% decrease in PCT            Discourage initiation of                                            antibiotics      Encourage discontinuation           of antibiotics    ----------------------------     -----------------------------         PCT >= 0.50 ng/mL              PCT 0.26 - 0.50 ng/mL               AND        <80% decrease in PCT             Encourage initiation of  antibiotics       Encourage continuation           of antibiotics    ----------------------------     -----------------------------        PCT >= 0.50 ng/mL                  PCT > 0.50 ng/mL               AND         increase in PCT                   Strongly encourage                                      initiation of antibiotics    Strongly encourage escalation           of antibiotics                                     -----------------------------                                           PCT <= 0.25 ng/mL                                                 OR                                        > 80% decrease in PCT                                      Discontinue / Do not initiate                                             antibiotics  Performed at [facility], [address] [place], [place] [address]   Basic metabolic panel     Status: Abnormal   Collection Time: 03/04/21  5:04 AM  Result Value Ref Range   Sodium 137 135 - 145 mmol/L   Potassium 3.4 (L) 3.5 - 5.1 mmol/L   Chloride 104 98 - 111 mmol/L   CO2 24 22 - 32 mmol/L   Glucose, Bld 124 (H) 70 - 99 mg/dL    Comment: Glucose reference range applies only to samples taken after fasting for at least 8 hours.   BUN 25 (H) 8 - 23 mg/dL   Creatinine, Ser 1.44 (H) 0.44 - 1.00 mg/dL   Calcium 8.3 (L) 8.9 - 10.3 mg/dL   GFR, Estimated 39 (L) >60 mL/min    Comment: (NOTE) Calculated using the CKD-EPI Creatinine Equation (2021)    Anion gap 9 5 - 15    Comment: Performed at [facility], [address] [place], [place]  [address]  CBC     Status: Abnormal   Collection Time: 03/04/21  5:04 AM  Result Value Ref Range   WBC 15.0 (H) 4.0 - 10.5 K/uL   RBC 3.50 (L) 3.87 - 5.11 MIL/uL   Hemoglobin 9.7 (L) 12.0 - 15.0 g/dL   HCT 29.6 (L) 36.0 - 46.0 %   MCV 84.6 80.0 - 100.0 fL   MCH 27.7 26.0 - 34.0 pg   MCHC 32.8 30.0 - 36.0 g/dL   RDW 14.3 11.5 - 15.5 %   Platelets 174 150 - 400 K/uL   nRBC 0.0 0.0 - 0.2 %    Comment: Performed at [facility], [address] [place], [place] [address]  Glucose, capillary     Status: None   Collection Time: 03/04/21  7:56 AM  Result Value Ref Range   Glucose-Capillary 90 70 - 99 mg/dL     Comment: Glucose reference range applies only to samples taken after fasting for at least 8 hours.    MR FOOT LEFT WO CONTRAST  Result Date: 03/04/2021 CLINICAL DATA:  Diabetic foot ulcer EXAM: MRI OF THE LEFT FOOT WITHOUT CONTRAST TECHNIQUE: Multiplanar, multisequence MR imaging of the left forefoot was performed. No intravenous contrast was administered. COMPARISON:  X-ray 03/03/2021 FINDINGS: Bones/Joint/Cartilage Bone marrow edema within the fifth metatarsal head and base of the fifth toe proximal phalanx (series 8, images 13-17) with subtle intermediate T1 marrow signal relative to the adjacent bony structures. No erosion. Trace fifth MTP joint effusion. Remaining osseous structures are intact. No fracture or dislocation. Mild degenerative changes within the left forefoot including the first MTP joint and tarsometatarsal joints. Ligaments Intact Lisfranc ligament. Collateral ligaments of the forefoot appear intact. Muscles and Tendons Denervation changes of the intrinsic foot musculature. Mild diffuse edema-like signal throughout the musculature which could reflect a component of myositis. Intact flexor and extensor tendons. No tenosynovial fluid collection. Soft tissues Soft tissue swelling and edema centered at the level of the fifth MTP joint. There are a few scattered areas of susceptibility artifact within the soft tissues corresponding to air seen on previous radiographs (series 3, images 27 and 29). No organized or drainable fluid collection. Prominent subcutaneous edema overlies the dorsum of the foot. IMPRESSION: 1. Bone marrow edema within the fifth metatarsal head and base of the fifth toe proximal phalanx with subtle T1 marrow signal changes. Findings are favored to represent early acute osteomyelitis. 2. Findings of cellulitis centered at the level of the fifth MTP joint. There are a few scattered areas of susceptibility artifact within the soft tissues corresponding to air seen on previous  radiographs. No organized or drainable fluid collection. 3. Mild diffuse edema-like signal throughout the musculature which may reflect denervation changes and/or myositis. Electronically Signed   By: Davina Poke D.O.   On: 03/04/2021 08:26   DG Chest Port 1 View  Result Date: 03/03/2021 CLINICAL DATA:  70 year old female with concern for sepsis. EXAM: PORTABLE CHEST 1 VIEW COMPARISON:  Chest radiograph dated 06/26/2019. FINDINGS: The lungs are clear. There is no pleural effusion pneumothorax. The cardiac silhouette is within limits. No acute osseous pathology. IMPRESSION: No active disease. Electronically Signed   By: Anner Crete M.D.   On: 03/03/2021 19:41   DG Foot Complete Left  Result Date: 03/03/2021 CLINICAL DATA:  Questionable sepsis. Left foot cellulitis. Open wound. EXAM: LEFT FOOT - COMPLETE 3+ VIEW COMPARISON:  None. FINDINGS: Soft tissue thickening with soft tissue air adjacent to the fifth metatarsal phalangeal joint.  No radiopaque foreign body. No definite subjacent bony destructive change or erosion. Hammertoe deformity of the digits. Mild pes planus and degenerative change in the midfoot. Plantar calcaneal spur and fragmented Achilles tendon enthesophyte. Advanced vascular calcifications. Soft tissue edema overlies the dorsum of the foot. IMPRESSION: 1. Soft tissue thickening with soft tissue air adjacent to the fifth metatarsophalangeal joint suspicious for soft tissue infection. Recommend correlation for site of ulcer. No radiographic findings of osteomyelitis. 2. Advanced vascular calcifications. 3. Soft tissue edema overlies the dorsum of the foot. Electronically Signed   By: Keith Rake M.D.   On: 03/03/2021 19:43    Review of Systems  Constitutional: Negative for chills and fever.  HENT: Negative for congestion and sore throat.   Respiratory: Negative for cough and shortness of breath.   Cardiovascular: Negative for chest pain and palpitations.  Gastrointestinal:  Negative for nausea and vomiting.  Endocrine: Negative for polydipsia and polyuria.  Genitourinary: Negative for frequency and urgency.  Musculoskeletal:       Patient relates previous amputation of her right fifth toe.  No significant pain.  Skin:       Increased redness and swelling in the left foot with some drainage from a sore on her left foot over the last couple of days.  Neurological:       Patient relates significant diabetic neuropathy.  Psychiatric/Behavioral: Negative for behavioral problems. The patient is not nervous/anxious.    Blood pressure 129/75, pulse 71, temperature 98.7 F (37.1 C), temperature source Oral, resp. rate 15, height 5\' 3"  (1.6 m), weight 86.2 kg, SpO2 100 %. Physical Exam Cardiovascular:     Comments: DP and PT pulses on the left are difficult to palpate, trace at best.  DP palpable on the right with trace PT. Musculoskeletal:     Comments: Previous fifth ray resection on the right foot.  Adequate range of motion of the pedal joints.  Muscle testing deferred.  Skin:    Comments: Significant edema in the left lower extremity as compared to the right.  Focal erythema along the dorsal and lateral aspect of the left foot with a full-thickness plantar ulceration.  No expressible purulence noted.  Neurological:     Comments: Loss of protective threshold with a monofilament wire bilateral.         Assessment/Plan: Assessment: 1.  Full-thickness ulceration left foot with cellulitis. 2.  Diabetes with associated neuropathy. 3.  Osteomyelitis left fifth ray.  Plan: Discussed with the patient the need for amputation of the fifth toe with ray resection as previously performed on the right foot.  Discussed possible risks and complications of the procedure including inability to heal due to continued infection, her diabetes, or her vascular status.  Discussed that no guarantees could be given as to the outcome of surgery.  We will obtain consent form for  amputation left fifth toe with partial ray resection.  N.p.o. after breakfast this morning.  Also discussed with vascular surgery and we will consult for possible intervention later this week.  Plan for surgery later this afternoon.  Durward Fortes 03/04/2021, 8:31 AM

## 2021-03-04 NOTE — Progress Notes (Signed)
PROGRESS NOTE    Elizabeth [name]  TOI:[id number] DOB: [birth year] DOA: 03/03/2021 PCP: Marguerita Merles, MD  150A/150A-AA   Assessment & Plan:   Active Problems:   Cellulitis in diabetic foot ([place])   Elizabeth [name] is a 70 y.o. Caucasian female with medical history significant for type diabetes mellitus, hypertension, lymphedema, CVA and psoriasis, who presented to the emergency room with acute onset of worsening left foot erythema, swelling, pain and tenderness for the last couple of days.  She denies any fever or chills.   Full-thickness ulceration left foot with cellulitis. Osteomyelitis left fifth ray --started on vanc/cefepime Plan: --OR today for amputation of the 5th toe with ray resection  2.  Acute kidney injury superimposed on stage IIIa chronic kidney disease --cont NS@50  --hold home Lisinopril  3.  Essential hypertension. - Hold home coreg and Lisinopril due to intermittent low BP and HR  4.  Type 2 diabetes mellitus - We will hold off metformin given acute kidney injury. --cont Tradjenta --SSI  Diabetic peripheral neuropathy --cont gabapentin   hypokalemia and hypomagnesemia --monitor and replete PRN  5.  Dyslipidemia. - We will continue statin therapy.  6.  History of breast cancer. - home letrozole has been held  7.  Depression. - We will continue her Paxil.   DVT prophylaxis: Lovenox SQ Code Status: Full code  Family Communication:  Level of care: Med-Surg Dispo:   The patient is from: home Anticipated d/c is to: home Anticipated d/c date is: 1-2 days Patient currently is not medically ready to d/c due to: pending OR   Subjective and Interval History:  Pt denied pain.  No dyspnea.  Just waiting on surgery.   Objective: Vitals:   03/04/21 2240 03/04/21 2245 03/04/21 2250 03/04/21 2308  BP: (!) 125/50  (!) 128/57 (!) 119/50  Pulse: (!) 36 (!) 37 (!) 37 96  Resp: 15 15 18 17   Temp:  98.8 F (37.1 C)  98.1 F (36.7 C)   TempSrc:      SpO2: 96% 95% 96% 96%  Weight:      Height:        Intake/Output Summary (Last 24 hours) at 03/04/2021 2323 Last data filed at 03/04/2021 2245 Gross per 24 hour  Intake 1130 ml  Output 0 ml  Net 1130 ml   Filed Weights   03/03/21 1848  Weight: 86.2 kg    Examination:   Constitutional: NAD, alert but tired-appearing, oriented HEENT: conjunctivae and lids normal, EOMI CV: No cyanosis.   RESP: normal respiratory effort, on RA Extremities: No effusions, edema in RLE.  Swelling and erythema in left foot which was wrapped. SKIN: warm, dry Neuro: II - XII grossly intact.   Psych: depressed mood and affect.     Data Reviewed: I have personally reviewed following labs and imaging studies  CBC: Recent Labs  Lab 03/03/21 1917 03/04/21 0504  WBC 19.3* 15.0*  NEUTROABS 16.8*  --   HGB 10.8* 9.7*  HCT 33.7* 29.6*  MCV 85.1 84.6  PLT 202 833   Basic Metabolic Panel: Recent Labs  Lab 03/03/21 1917 03/04/21 0504  NA 134* 137  K 3.4* 3.4*  CL 100 104  CO2 22 24  GLUCOSE 145* 124*  BUN 27* 25*  CREATININE 1.59* 1.44*  CALCIUM 8.5* 8.3*  MG 1.2*  --    GFR: Estimated Creatinine Clearance: 38.4 mL/min (A) (by C-G formula based on SCr of 1.44 mg/dL (H)). Liver Function Tests: Recent Labs  Lab 03/03/21  1917  AST 14*  ALT 10  ALKPHOS 73  BILITOT 0.9  PROT 6.7  ALBUMIN 3.2*   No results for input(s): LIPASE, AMYLASE in the last 168 hours. No results for input(s): AMMONIA in the last 168 hours. Coagulation Profile: Recent Labs  Lab 03/03/21 1917  INR 1.2   Cardiac Enzymes: No results for input(s): CKTOTAL, CKMB, CKMBINDEX, TROPONINI in the last 168 hours. BNP (last 3 results) No results for input(s): PROBNP in the last 8760 hours. HbA1C: Recent Labs    03/04/21 0504  HGBA1C 5.6   CBG: Recent Labs  Lab 03/04/21 0100 03/04/21 0756 03/04/21 1121 03/04/21 1710 03/04/21 2221  GLUCAP 139* 90 177* 121* 110*   Lipid Profile: No results  for input(s): CHOL, HDL, LDLCALC, TRIG, CHOLHDL, LDLDIRECT in the last 72 hours. Thyroid Function Tests: No results for input(s): TSH, T4TOTAL, FREET4, T3FREE, THYROIDAB in the last 72 hours. Anemia Panel: No results for input(s): VITAMINB12, FOLATE, FERRITIN, TIBC, IRON, RETICCTPCT in the last 72 hours. Sepsis Labs: Recent Labs  Lab 03/03/21 1917 03/03/21 2110 03/04/21 0504  PROCALCITON 0.10  --  0.17  LATICACIDVEN 2.1* 1.4  --     Recent Results (from the past 240 hour(s))  Blood culture (routine x 2)     Status: None (Preliminary result)   Collection Time: 03/03/21  7:17 PM   Specimen: BLOOD  Result Value Ref Range Status   Specimen Description BLOOD RIGHT ANTECUBITAL  Final   Special Requests   Final    BOTTLES DRAWN AEROBIC AND ANAEROBIC Blood Culture adequate volume   Culture   Final    NO GROWTH < 12 HOURS Performed at [facility], [address], [place], [place] [address]    Report Status PENDING  Incomplete  Blood culture (routine x 2)     Status: None (Preliminary result)   Collection Time: 03/03/21  7:17 PM   Specimen: BLOOD  Result Value Ref Range Status   Specimen Description BLOOD BLOOD RIGHT ARM  Final   Special Requests   Final    BOTTLES DRAWN AEROBIC AND ANAEROBIC Blood Culture adequate volume   Culture   Final    NO GROWTH < 12 HOURS Performed at [facility] [address] [place], [place] [address]    Report Status PENDING  Incomplete  Resp Panel by RT-PCR (Flu A&B, Covid) Nasopharyngeal Swab     Status: None   Collection Time: 03/03/21  7:17 PM   Specimen: Nasopharyngeal Swab; Nasopharyngeal(NP) swabs in vial transport medium  Result Value Ref Range Status   SARS Coronavirus 2 by RT PCR NEGATIVE NEGATIVE Final    Comment: (NOTE) SARS-CoV-2 target nucleic acids are NOT DETECTED.  The SARS-CoV-2 RNA is generally detectable in upper respiratory specimens during the acute phase of infection. The lowest concentration of  SARS-CoV-2 viral copies this assay can detect is 138 copies/mL. A negative result does not preclude SARS-Cov-2 infection and should not be used as the sole basis for treatment or other patient management decisions. A negative result may occur with  improper specimen collection/handling, submission of specimen other than nasopharyngeal swab, presence of viral mutation(s) within the areas targeted by this assay, and inadequate number of viral copies(<138 copies/mL). A negative result must be combined with clinical observations, patient history, and epidemiological information. The expected result is Negative.  Fact Sheet for Patients:  EntrepreneurPulse.com.au  Fact Sheet for Healthcare Providers:  IncredibleEmployment.be  This test is no t yet approved or cleared by the [place]  States FDA and  has been authorized for detection and/or diagnosis of SARS-CoV-2 by FDA under an Emergency Use Authorization (EUA). This EUA will remain  in effect (meaning this test can be used) for the duration of the COVID-19 declaration under Section 564(b)(1) of the Act, 21 U.S.C.section 360bbb-3(b)(1), unless the authorization is terminated  or revoked sooner.       Influenza A by PCR NEGATIVE NEGATIVE Final   Influenza B by PCR NEGATIVE NEGATIVE Final    Comment: (NOTE) The Xpert Xpress SARS-CoV-2/FLU/RSV plus assay is intended as an aid in the diagnosis of influenza from Nasopharyngeal swab specimens and should not be used as a sole basis for treatment. Nasal washings and aspirates are unacceptable for Xpert Xpress SARS-CoV-2/FLU/RSV testing.  Fact Sheet for Patients: EntrepreneurPulse.com.au  Fact Sheet for Healthcare Providers: IncredibleEmployment.be  This test is not yet approved or cleared by the [place] FDA and has been authorized for detection and/or diagnosis of SARS-CoV-2 by FDA under an Emergency Use  Authorization (EUA). This EUA will remain in effect (meaning this test can be used) for the duration of the COVID-19 declaration under Section 564(b)(1) of the Act, 21 U.S.C. section 360bbb-3(b)(1), unless the authorization is terminated or revoked.  Performed at [facility], [address] [place], [place] [address]       Radiology Studies: MR FOOT LEFT WO CONTRAST  Result Date: 03/04/2021 CLINICAL DATA:  Diabetic foot ulcer EXAM: MRI OF THE LEFT FOOT WITHOUT CONTRAST TECHNIQUE: Multiplanar, multisequence MR imaging of the left forefoot was performed. No intravenous contrast was administered. COMPARISON:  X-ray 03/03/2021 FINDINGS: Bones/Joint/Cartilage Bone marrow edema within the fifth metatarsal head and base of the fifth toe proximal phalanx (series 8, images 13-17) with subtle intermediate T1 marrow signal relative to the adjacent bony structures. No erosion. Trace fifth MTP joint effusion. Remaining osseous structures are intact. No fracture or dislocation. Mild degenerative changes within the left forefoot including the first MTP joint and tarsometatarsal joints. Ligaments Intact Lisfranc ligament. Collateral ligaments of the forefoot appear intact. Muscles and Tendons Denervation changes of the intrinsic foot musculature. Mild diffuse edema-like signal throughout the musculature which could reflect a component of myositis. Intact flexor and extensor tendons. No tenosynovial fluid collection. Soft tissues Soft tissue swelling and edema centered at the level of the fifth MTP joint. There are a few scattered areas of susceptibility artifact within the soft tissues corresponding to air seen on previous radiographs (series 3, images 27 and 29). No organized or drainable fluid collection. Prominent subcutaneous edema overlies the dorsum of the foot. IMPRESSION: 1. Bone marrow edema within the fifth metatarsal head and base of the fifth toe proximal phalanx with subtle T1 marrow signal  changes. Findings are favored to represent early acute osteomyelitis. 2. Findings of cellulitis centered at the level of the fifth MTP joint. There are a few scattered areas of susceptibility artifact within the soft tissues corresponding to air seen on previous radiographs. No organized or drainable fluid collection. 3. Mild diffuse edema-like signal throughout the musculature which may reflect denervation changes and/or myositis. Electronically Signed   By: Davina Poke D.O.   On: 03/04/2021 08:26   DG Chest Port 1 View  Result Date: 03/03/2021 CLINICAL DATA:  70 year old female with concern for sepsis. EXAM: PORTABLE CHEST 1 VIEW COMPARISON:  Chest radiograph dated 06/26/2019. FINDINGS: The lungs are clear. There is no pleural effusion pneumothorax. The cardiac silhouette is within limits. No acute osseous pathology. IMPRESSION: No active disease. Electronically Signed  By: Anner Crete M.D.   On: 03/03/2021 19:41   DG Foot Complete Left  Result Date: 03/03/2021 CLINICAL DATA:  Questionable sepsis. Left foot cellulitis. Open wound. EXAM: LEFT FOOT - COMPLETE 3+ VIEW COMPARISON:  None. FINDINGS: Soft tissue thickening with soft tissue air adjacent to the fifth metatarsal phalangeal joint. No radiopaque foreign body. No definite subjacent bony destructive change or erosion. Hammertoe deformity of the digits. Mild pes planus and degenerative change in the midfoot. Plantar calcaneal spur and fragmented Achilles tendon enthesophyte. Advanced vascular calcifications. Soft tissue edema overlies the dorsum of the foot. IMPRESSION: 1. Soft tissue thickening with soft tissue air adjacent to the fifth metatarsophalangeal joint suspicious for soft tissue infection. Recommend correlation for site of ulcer. No radiographic findings of osteomyelitis. 2. Advanced vascular calcifications. 3. Soft tissue edema overlies the dorsum of the foot. Electronically Signed   By: Keith Rake M.D.   On: 03/03/2021 19:43      Scheduled Meds: . calcium-vitamin D  2 tablet Oral Q breakfast  . enoxaparin (LOVENOX) injection  0.5 mg/kg Subcutaneous Q24H  . gabapentin  300 mg Oral TID  . insulin aspart  0-15 Units Subcutaneous TID PC & HS  . linagliptin  5 mg Oral Daily  . PARoxetine  40 mg Oral Daily  . potassium chloride  20 mEq Oral Daily  . pravastatin  40 mg Oral QHS   Continuous Infusions: . sodium chloride 50 mL/hr at 03/04/21 2112  . ceFEPime (MAXIPIME) IV 2 g (03/04/21 1005)  . [START ON 03/05/2021] vancomycin       LOS: 1 day     Enzo Bi, MD [facility] If 7PM-7AM, please contact night-coverage 03/04/2021, 11:23 PM

## 2021-03-04 NOTE — Anesthesia Preprocedure Evaluation (Addendum)
Anesthesia Evaluation  Patient identified by MRN, date of birth, ID band Patient awake    Reviewed: Allergy & Precautions, NPO status , Patient's Chart, lab work & pertinent test results  History of Anesthesia Complications (+) PONV and history of anesthetic complications  Airway Mallampati: II  TM Distance: >3 FB Neck ROM: Full    Dental  (+) Poor Dentition   Pulmonary neg pulmonary ROS, neg sleep apnea, neg COPD, Patient abstained from smoking.Not current smoker,  Childhood asthma   - rhonchi (-) wheezing      Cardiovascular Exercise Tolerance: Good METShypertension, Pt. on medications and Pt. on home beta blockers + Peripheral Vascular Disease  (-) CAD and (-) Past MI (-) dysrhythmias  - Systolic murmurs and - Diastolic murmurs    Neuro/Psych PSYCHIATRIC DISORDERS Depression Residual symptoms: "slow" sometimes CVA (R sided weakness), Residual Symptoms    GI/Hepatic Neg liver ROS, GERD  Medicated,  Endo/Other  diabetes, Type 2, Oral Hypoglycemic Agents, Insulin Dependent  Renal/GU CRFRenal disease     Musculoskeletal negative musculoskeletal ROS (+)   Abdominal (+) - obese,   Peds  Hematology negative hematology ROS (+)   Anesthesia Other Findings Past Medical History: No date: Cancer [facility])     Comment:  breast 05/20/2014: Carcinoma of right breast ([place]) No date: Diabetes mellitus without complication ([place]) No date: Hypertension No date: Lymphedema 2015: Personal history of radiation therapy     Comment:  Right breast No date: PONV (postoperative nausea and vomiting) 1990: Psoriasis 2000: Stroke ([place])     Comment:  residual left arm weakness No date: Varicose veins   Reproductive/Obstetrics                            Anesthesia Physical  Anesthesia Plan  ASA: III  Anesthesia Plan: General   Post-op Pain Management:    Induction: Intravenous  PONV Risk Score and Plan: 4  or greater and Ondansetron, Dexamethasone and Treatment may vary due to age or medical condition  Airway Management Planned: LMA  Additional Equipment: None  Intra-op Plan:   Post-operative Plan: Extubation in OR  Informed Consent: I have reviewed the patients History and Physical, chart, labs and discussed the procedure including the risks, benefits and alternatives for the proposed anesthesia with the patient or authorized representative who has indicated his/her understanding and acceptance.     Dental advisory given  Plan Discussed with: CRNA and Anesthesiologist  Anesthesia Plan Comments: (Discussed risks of anesthesia with patient, including PONV, sore throat, lip/dental damage. Rare risks discussed as well, such as cardiorespiratory and neurological sequelae. Patient understands.)        Anesthesia Quick Evaluation

## 2021-03-04 NOTE — Interval H&P Note (Signed)
History and Physical Interval Note:  03/04/2021 8:02 PM  Elizabeth [name]  has presented today for surgery, with the diagnosis of 5th Wray Resection Left Foot.  The various methods of treatment have been discussed with the patient and family. After consideration of risks, benefits and other options for treatment, the patient has consented to  Procedure(s): AMPUTATION RAY (Left) as a surgical intervention.  The patient's history has been reviewed, patient examined, no change in status, stable for surgery.  I have reviewed the patient's chart and labs.  Questions were answered to the patient's satisfaction.     Durward Fortes

## 2021-03-05 ENCOUNTER — Encounter: Payer: Self-pay | Admitting: Podiatry

## 2021-03-05 ENCOUNTER — Other Ambulatory Visit (INDEPENDENT_AMBULATORY_CARE_PROVIDER_SITE_OTHER): Payer: Self-pay | Admitting: Vascular Surgery

## 2021-03-05 DIAGNOSIS — L97529 Non-pressure chronic ulcer of other part of left foot with unspecified severity: Secondary | ICD-10-CM | POA: Diagnosis not present

## 2021-03-05 DIAGNOSIS — L03119 Cellulitis of unspecified part of limb: Secondary | ICD-10-CM | POA: Diagnosis not present

## 2021-03-05 DIAGNOSIS — D72829 Elevated white blood cell count, unspecified: Secondary | ICD-10-CM

## 2021-03-05 DIAGNOSIS — L03116 Cellulitis of left lower limb: Secondary | ICD-10-CM

## 2021-03-05 DIAGNOSIS — E11628 Type 2 diabetes mellitus with other skin complications: Secondary | ICD-10-CM | POA: Diagnosis not present

## 2021-03-05 LAB — BASIC METABOLIC PANEL
Anion gap: 9 (ref 5–15)
BUN: 20 mg/dL (ref 8–23)
CO2: 21 mmol/L — ABNORMAL LOW (ref 22–32)
Calcium: 8.5 mg/dL — ABNORMAL LOW (ref 8.9–10.3)
Chloride: 110 mmol/L (ref 98–111)
Creatinine, Ser: 1.24 mg/dL — ABNORMAL HIGH (ref 0.44–1.00)
GFR, Estimated: 47 mL/min — ABNORMAL LOW (ref >60)
Glucose, Bld: 184 mg/dL — ABNORMAL HIGH (ref 70–99)
Potassium: 4.4 mmol/L (ref 3.5–5.1)
Sodium: 140 mmol/L (ref 135–145)

## 2021-03-05 LAB — URINE CULTURE

## 2021-03-05 LAB — CBC
HCT: 33 % — ABNORMAL LOW (ref 36.0–46.0)
Hemoglobin: 10.6 g/dL — ABNORMAL LOW (ref 12.0–15.0)
MCH: 27.6 pg (ref 26.0–34.0)
MCHC: 32.1 g/dL (ref 30.0–36.0)
MCV: 85.9 fL (ref 80.0–100.0)
Platelets: 208 10*3/uL (ref 150–400)
RBC: 3.84 MIL/uL — ABNORMAL LOW (ref 3.87–5.11)
RDW: 14.5 % (ref 11.5–15.5)
WBC: 13.2 10*3/uL — ABNORMAL HIGH (ref 4.0–10.5)
nRBC: 0 % (ref 0.0–0.2)

## 2021-03-05 LAB — GLUCOSE, CAPILLARY
Glucose-Capillary: 109 mg/dL — ABNORMAL HIGH (ref 70–99)
Glucose-Capillary: 166 mg/dL — ABNORMAL HIGH (ref 70–99)
Glucose-Capillary: 242 mg/dL — ABNORMAL HIGH (ref 70–99)
Glucose-Capillary: 233 mg/dL — ABNORMAL HIGH (ref 70–99)
Glucose-Capillary: 184 mg/dL — ABNORMAL HIGH (ref 70–99)
Glucose-Capillary: 147 mg/dL — ABNORMAL HIGH (ref 70–99)

## 2021-03-05 LAB — MAGNESIUM: Magnesium: 2.4 mg/dL (ref 1.7–2.4)

## 2021-03-05 LAB — PROCALCITONIN: Procalcitonin: 0.21 ng/mL

## 2021-03-05 MED ORDER — SODIUM CHLORIDE 0.9 % IV SOLN: INTRAVENOUS | Status: DC

## 2021-03-05 MED ORDER — VANCOMYCIN HCL 750 MG/150ML IV SOLN
750.0000 mg | INTRAVENOUS | Status: DC
  Administered 2021-03-05 – 2021-03-06 (×2): 750 mg via INTRAVENOUS
  Filled 2021-03-05 (×3): qty 150

## 2021-03-05 NOTE — Anesthesia Postprocedure Evaluation (Signed)
Anesthesia Post Note  Patient: Elizabeth [name]  Procedure(s) Performed: AMPUTATION RAY (Left Fifth Toe)  Patient location during evaluation: PACU Anesthesia Type: General Level of consciousness: awake and alert Pain management: pain level controlled Vital Signs Assessment: post-procedure vital signs reviewed and stable Respiratory status: spontaneous breathing, nonlabored ventilation, respiratory function stable and patient connected to nasal cannula oxygen Cardiovascular status: blood pressure returned to baseline and stable Postop Assessment: no apparent nausea or vomiting Anesthetic complications: no   No complications documented.   Last Vitals:  Vitals:   03/04/21 2250 03/04/21 2308  BP: (!) 128/57 (!) 119/50  Pulse: (!) 37 96  Resp: 18 17  Temp:  36.7 C  SpO2: 96% 96%    Last Pain:  Vitals:   03/04/21 2350  TempSrc:   PainSc: Asleep                 Arita Miss

## 2021-03-05 NOTE — Progress Notes (Signed)
PROGRESS NOTE    Elizabeth [name]  [id number] DOB: 1951/09/02 DOA: 03/03/2021 PCP: Marguerita Merles, MD  150A/150A-AA   Assessment & Plan:   Active Problems:   Cellulitis in diabetic foot ([place])   Elizabeth [name] is a 70 y.o. Caucasian female with medical history significant for type diabetes mellitus, hypertension, lymphedema, CVA and psoriasis, who presented to the emergency room with acute onset of worsening left foot erythema, swelling, pain and tenderness for the last couple of days.  She denies any fever or chills.   Full-thickness ulceration left foot with cellulitis. Osteomyelitis left fifth ray S/p amputation of the 5th toe with ray resection --started on vanc/cefepime Plan: --cont vanc/cefepime for now --dressing change today by podiatry --Patient may be partial weightbearing with assistance in a surgical shoe with pressure only on the heel. --plan for dressing change on Sat. --plan for vascular angiography tomorrow  2.  Acute kidney injury superimposed on stage IIIa chronic kidney disease --cont MIVF ordered by vascular surgery --hold home Lisinopril  3.  Essential hypertension. - intermittent low BP and HR --Hold home coreg and Lisinopril  4.  Type 2 diabetes mellitus - We will hold off metformin given acute kidney injury. --cont Tradjenta --SSI  Diabetic peripheral neuropathy --cont gabapentin   hypokalemia and hypomagnesemia --monitor and replete PRN  5.  Dyslipidemia. -cont statin  6.  History of breast cancer. - home letrozole has been held  7.  Depression. - We will continue her Paxil.   DVT prophylaxis: Lovenox SQ Code Status: Full code  Family Communication:  Level of care: Med-Surg Dispo:   The patient is from: home Anticipated d/c is to: home Anticipated d/c date is: 1-2 days Patient currently is not medically ready to d/c due to: pending angiogram    Subjective and Interval History:  Pt reported no pain in her foot.   Normal oral intake.   Objective: Vitals:   03/04/21 2308 03/05/21 0209 03/05/21 0452 03/05/21 1529  BP: (!) 119/50 (!) 116/59 114/72 130/62  Pulse: 96 72 69 63  Resp: 17 18 18 20   Temp: 98.1 F (36.7 C) 98.1 F (36.7 C) 97.9 F (36.6 C) 98 F (36.7 C)  TempSrc:      SpO2: 96% 96% 95% 98%  Weight:      Height:        Intake/Output Summary (Last 24 hours) at 03/05/2021 1856 Last data filed at 03/05/2021 1806 Gross per 24 hour  Intake 1470 ml  Output 0 ml  Net 1470 ml   Filed Weights   03/03/21 1848  Weight: 86.2 kg    Examination:   Constitutional: NAD, alert but tired-appearing, oriented HEENT: conjunctivae and lids normal, EOMI CV: No cyanosis.   RESP: normal respiratory effort, on RA Extremities: No effusions, edema in RLE.  Left foot wrapped SKIN: warm, dry Neuro: II - XII grossly intact.   Psych: better mood and affect today.   Data Reviewed: I have personally reviewed following labs and imaging studies  CBC: Recent Labs  Lab 03/03/21 1917 03/04/21 0504 03/05/21 0549  WBC 19.3* 15.0* 13.2*  NEUTROABS 16.8*  --   --   HGB 10.8* 9.7* 10.6*  HCT 33.7* 29.6* 33.0*  MCV 85.1 84.6 85.9  PLT 202 174 [id number]   Basic Metabolic Panel: Recent Labs  Lab 03/03/21 1917 03/04/21 0504 03/05/21 0549  NA 134* 137 140  K 3.4* 3.4* 4.4  CL 100 104 110  CO2 22 24 21*  GLUCOSE 145* 124*  184*  BUN 27* 25* 20  CREATININE 1.59* 1.44* 1.24*  CALCIUM 8.5* 8.3* 8.5*  MG 1.2*  --  2.4   GFR: Estimated Creatinine Clearance: 44.5 mL/min (A) (by C-G formula based on SCr of 1.24 mg/dL (H)). Liver Function Tests: Recent Labs  Lab 03/03/21 1917  AST 14*  ALT 10  ALKPHOS 73  BILITOT 0.9  PROT 6.7  ALBUMIN 3.2*   No results for input(s): LIPASE, AMYLASE in the last 168 hours. No results for input(s): AMMONIA in the last 168 hours. Coagulation Profile: Recent Labs  Lab 03/03/21 1917  INR 1.2   Cardiac Enzymes: No results for input(s): CKTOTAL, CKMB, CKMBINDEX,  TROPONINI in the last 168 hours. BNP (last 3 results) No results for input(s): PROBNP in the last 8760 hours. HbA1C: Recent Labs    03/04/21 0504  HGBA1C 5.6   CBG: Recent Labs  Lab 03/04/21 2347 03/05/21 0800 03/05/21 1141 03/05/21 1348 03/05/21 1645  GLUCAP 163* 166* 242* 233* 184*   Lipid Profile: No results for input(s): CHOL, HDL, LDLCALC, TRIG, CHOLHDL, LDLDIRECT in the last 72 hours. Thyroid Function Tests: No results for input(s): TSH, T4TOTAL, FREET4, T3FREE, THYROIDAB in the last 72 hours. Anemia Panel: No results for input(s): VITAMINB12, FOLATE, FERRITIN, TIBC, IRON, RETICCTPCT in the last 72 hours. Sepsis Labs: Recent Labs  Lab 03/03/21 1917 03/03/21 2110 03/04/21 0504 03/05/21 0549  PROCALCITON 0.10  --  0.17 0.21  LATICACIDVEN 2.1* 1.4  --   --     Recent Results (from the past 240 hour(s))  Blood culture (routine x 2)     Status: None (Preliminary result)   Collection Time: 03/03/21  7:17 PM   Specimen: BLOOD  Result Value Ref Range Status   Specimen Description BLOOD RIGHT ANTECUBITAL  Final   Special Requests   Final    BOTTLES DRAWN AEROBIC AND ANAEROBIC Blood Culture adequate volume   Culture   Final    NO GROWTH < 12 HOURS Performed at [facility], [address] [place], [place] [address]    Report Status PENDING  Incomplete  Blood culture (routine x 2)     Status: None (Preliminary result)   Collection Time: 03/03/21  7:17 PM   Specimen: BLOOD  Result Value Ref Range Status   Specimen Description BLOOD BLOOD RIGHT ARM  Final   Special Requests   Final    BOTTLES DRAWN AEROBIC AND ANAEROBIC Blood Culture adequate volume   Culture   Final    NO GROWTH < 12 HOURS Performed at [facility], [address] [place], [place] [address]    Report Status PENDING  Incomplete  Resp Panel by RT-PCR (Flu A&B, Covid) Nasopharyngeal Swab     Status: None   Collection Time: 03/03/21  7:17 PM   Specimen: Nasopharyngeal Swab;  Nasopharyngeal(NP) swabs in vial transport medium  Result Value Ref Range Status   SARS Coronavirus 2 by RT PCR NEGATIVE NEGATIVE Final    Comment: (NOTE) SARS-CoV-2 target nucleic acids are NOT DETECTED.  The SARS-CoV-2 RNA is generally detectable in upper respiratory specimens during the acute phase of infection. The lowest concentration of SARS-CoV-2 viral copies this assay can detect is 138 copies/mL. A negative result does not preclude SARS-Cov-2 infection and should not be used as the sole basis for treatment or other patient management decisions. A negative result may occur with  improper specimen collection/handling, submission of specimen other than nasopharyngeal swab, presence of viral mutation(s) within the areas targeted by  this assay, and inadequate number of viral copies(<138 copies/mL). A negative result must be combined with clinical observations, patient history, and epidemiological information. The expected result is Negative.  Fact Sheet for Patients:  EntrepreneurPulse.com.au  Fact Sheet for Healthcare Providers:  IncredibleEmployment.be  This test is no t yet approved or cleared by the [place] FDA and  has been authorized for detection and/or diagnosis of SARS-CoV-2 by FDA under an Emergency Use Authorization (EUA). This EUA will remain  in effect (meaning this test can be used) for the duration of the COVID-19 declaration under Section 564(b)(1) of the Act, 21 U.S.C.section 360bbb-3(b)(1), unless the authorization is terminated  or revoked sooner.       Influenza A by PCR NEGATIVE NEGATIVE Final   Influenza B by PCR NEGATIVE NEGATIVE Final    Comment: (NOTE) The Xpert Xpress SARS-CoV-2/FLU/RSV plus assay is intended as an aid in the diagnosis of influenza from Nasopharyngeal swab specimens and should not be used as a sole basis for treatment. Nasal washings and aspirates are unacceptable for Xpert Xpress  SARS-CoV-2/FLU/RSV testing.  Fact Sheet for Patients: EntrepreneurPulse.com.au  Fact Sheet for Healthcare Providers: IncredibleEmployment.be  This test is not yet approved or cleared by the [place] FDA and has been authorized for detection and/or diagnosis of SARS-CoV-2 by FDA under an Emergency Use Authorization (EUA). This EUA will remain in effect (meaning this test can be used) for the duration of the COVID-19 declaration under Section 564(b)(1) of the Act, 21 U.S.C. section 360bbb-3(b)(1), unless the authorization is terminated or revoked.  Performed at [facility], [address], [place], [place] [address]   Urine culture     Status: Abnormal   Collection Time: 03/03/21  9:10 PM   Specimen: Urine, Random  Result Value Ref Range Status   Specimen Description   Final    URINE, RANDOM Performed at [facility], [address] [place], [place] [address]    Special Requests   Final    NONE Performed at [facility], [address] [place], [place] [address]    Culture MULTIPLE SPECIES PRESENT, SUGGEST RECOLLECTION (A)  Final   Report Status 03/05/2021 FINAL  Final  Aerobic/Anaerobic Culture w Gram Stain (surgical/deep wound)     Status: None (Preliminary result)   Collection Time: 03/04/21  9:38 PM   Specimen: PATH Other; Wound  Result Value Ref Range Status   Specimen Description   Final    WOUND LEFT FOOT Performed at [facility], [address] [place], [place] [address]    Special Requests   Final    NONE Performed at [facility], [place], [place] [address]    Gram Stain   Final    NO WBC SEEN ABUNDANT GRAM POSITIVE COCCI MODERATE GRAM NEGATIVE RODS FEW GRAM POSITIVE RODS Performed at [facility], [place] [address] [place], [place] [address]    Culture PENDING  Incomplete   Report Status PENDING  Incomplete  Aerobic Culture w Gram Stain  (superficial specimen)     Status: None (Preliminary result)   Collection Time: 03/04/21  9:44 PM   Specimen: PATH Bone biopsy; Other  Result Value Ref Range Status   Specimen Description   Final    BONE LEFT 5TH METATARSAL Performed at [facility], [address] [place], [place] [address]    Special Requests   Final    NONE Performed at [facility], [place],  [place] [address]    Gram Stain   Final    RARE WBC PRESENT, PREDOMINANTLY PMN NO ORGANISMS SEEN Performed at [facility], [place] [address] [place], [place] [address]    Culture PENDING  Incomplete   Report Status PENDING  Incomplete  Anaerobic culture w Gram Stain     Status: None (Preliminary result)   Collection Time: 03/04/21  9:44 PM   Specimen: PATH Bone biopsy; Other  Result Value Ref Range Status   Specimen Description   Final    BONE LEFT 5TH METARSAL Performed at [facility], [address], [place], [place] [address]    Special Requests   Final    NONE Performed at [facility], [address] [place], [place] [address]    Gram Stain   Final    NO WBC SEEN NO ORGANISMS SEEN Performed at [facility], [place] [address] [place], [place] [address]    Culture PENDING  Incomplete   Report Status PENDING  Incomplete      Radiology Studies: MR FOOT LEFT WO CONTRAST  Result Date: 03/04/2021 CLINICAL DATA:  Diabetic foot ulcer EXAM: MRI OF THE LEFT FOOT WITHOUT CONTRAST TECHNIQUE: Multiplanar, multisequence MR imaging of the left forefoot was performed. No intravenous contrast was administered. COMPARISON:  X-ray 03/03/2021 FINDINGS: Bones/Joint/Cartilage Bone marrow edema within the fifth metatarsal head and base of the fifth toe proximal phalanx (series 8, images 13-17) with subtle intermediate T1 marrow signal relative to the adjacent bony structures. No erosion. Trace fifth MTP joint effusion. Remaining osseous structures are intact. No  fracture or dislocation. Mild degenerative changes within the left forefoot including the first MTP joint and tarsometatarsal joints. Ligaments Intact Lisfranc ligament. Collateral ligaments of the forefoot appear intact. Muscles and Tendons Denervation changes of the intrinsic foot musculature. Mild diffuse edema-like signal throughout the musculature which could reflect a component of myositis. Intact flexor and extensor tendons. No tenosynovial fluid collection. Soft tissues Soft tissue swelling and edema centered at the level of the fifth MTP joint. There are a few scattered areas of susceptibility artifact within the soft tissues corresponding to air seen on previous radiographs (series 3, images 27 and 29). No organized or drainable fluid collection. Prominent subcutaneous edema overlies the dorsum of the foot. IMPRESSION: 1. Bone marrow edema within the fifth metatarsal head and base of the fifth toe proximal phalanx with subtle T1 marrow signal changes. Findings are favored to represent early acute osteomyelitis. 2. Findings of cellulitis centered at the level of the fifth MTP joint. There are a few scattered areas of susceptibility artifact within the soft tissues corresponding to air seen on previous radiographs. No organized or drainable fluid collection. 3. Mild diffuse edema-like signal throughout the musculature which may reflect denervation changes and/or myositis. Electronically Signed   By: Davina Poke D.O.   On: 03/04/2021 08:26   DG Chest Port 1 View  Result Date: 03/03/2021 CLINICAL DATA:  70 year old female with concern for sepsis. EXAM: PORTABLE CHEST 1 VIEW COMPARISON:  Chest radiograph dated 06/26/2019. FINDINGS: The lungs are clear. There is no pleural effusion pneumothorax. The cardiac silhouette is within limits. No acute osseous pathology. IMPRESSION: No active disease. Electronically Signed   By: Anner Crete M.D.   On: 03/03/2021 19:41   DG Foot Complete Left  Result  Date: 03/03/2021 CLINICAL DATA:  Questionable sepsis. Left foot cellulitis. Open wound. EXAM: LEFT FOOT - COMPLETE 3+ VIEW COMPARISON:  None. FINDINGS: Soft tissue thickening with  soft tissue air adjacent to the fifth metatarsal phalangeal joint. No radiopaque foreign body. No definite subjacent bony destructive change or erosion. Hammertoe deformity of the digits. Mild pes planus and degenerative change in the midfoot. Plantar calcaneal spur and fragmented Achilles tendon enthesophyte. Advanced vascular calcifications. Soft tissue edema overlies the dorsum of the foot. IMPRESSION: 1. Soft tissue thickening with soft tissue air adjacent to the fifth metatarsophalangeal joint suspicious for soft tissue infection. Recommend correlation for site of ulcer. No radiographic findings of osteomyelitis. 2. Advanced vascular calcifications. 3. Soft tissue edema overlies the dorsum of the foot. Electronically Signed   By: Keith Rake M.D.   On: 03/03/2021 19:43     Scheduled Meds: . calcium-vitamin D  2 tablet Oral Q breakfast  . enoxaparin (LOVENOX) injection  0.5 mg/kg Subcutaneous Q24H  . gabapentin  300 mg Oral TID  . insulin aspart  0-15 Units Subcutaneous TID PC & HS  . linagliptin  5 mg Oral Daily  . PARoxetine  40 mg Oral Daily  . potassium chloride  20 mEq Oral Daily  . pravastatin  40 mg Oral QHS   Continuous Infusions: . sodium chloride 75 mL/hr at 03/05/21 1132  . ceFEPime (MAXIPIME) IV 2 g (03/05/21 0847)  . vancomycin       LOS: 2 days     Enzo Bi, MD [facility] If 7PM-7AM, please contact night-coverage 03/05/2021, 6:56 PM

## 2021-03-05 NOTE — Consult Note (Signed)
Pharmacy Antibiotic Note  Elizabeth [name] is a 70 y.o. female with medical history including diabetes, PVD admitted on 03/03/2021 with cellulitis of left foot. Imaging impression of soft tissue infection without evidence of osteomyelitis. ESR 71, CRP pending. WBC 19.3. Pharmacy has been consulted for vancomycin and cefepime dosing.  Scr 1.59, unclear baseline  Plan:  Cefepime 2 g IV q12h (renally adjusted)  Will adjust dose to: Vancomycin 715m Q24 hours, as renal function has improved --Calculated AUC 514, Cmin 14.4 --Daily Scr per protocol --Levels at steady state as clinically indicated  Height: _0  (160 cm) Weight: 86.2 kg (190 lb) IBW/kg (Calculated) : 52.4  Temp (24hrs), Avg:98.5 F (36.9 C), Min:97.9 F (36.6 C), Max:99.5 F (37.5 C)  Recent Labs  Lab 03/03/21 1917 03/03/21 2110 03/04/21 0504 03/05/21 0549  WBC 19.3*  --  15.0* 13.2*  CREATININE 1.59*  --  1.44* 1.24*  LATICACIDVEN 2.1* 1.4  --   --     Estimated Creatinine Clearance: 44.5 mL/min (A) (by C-G formula based on SCr of 1.24 mg/dL (H)).    No Known Allergies  Antimicrobials this admission: Cefepime 4/26 >>  Vancomycin 4/26 >>   Dose adjustments this admission: N/A  Microbiology results: 4/26 BCx: NGTD 4/26 UCx: Multiple species 4/27 Left toe wound Cx: abundant GPC,moderate GNR, few GPR  Thank you for allowing pharmacy to be a part of this patient's care.  Isiaha Greenup A Prithvi Kooi 03/05/2021 3:20 PM

## 2021-03-05 NOTE — Progress Notes (Signed)
1 Day Post-Op   Subjective/Chief Complaint: Patient seen.  Overall doing well with no complaints of any pain.   Objective: Vital signs in last 24 hours: Temp:  [97.9 F (36.6 C)-99.5 F (37.5 C)] 97.9 F (36.6 C) (04/28 0452) Pulse Rate:  [36-96] 69 (04/28 0452) Resp:  [15-22] 18 (04/28 0452) BP: (110-135)/(47-77) 114/72 (04/28 0452) SpO2:  [95 %-99 %] 95 % (04/28 0452) Last BM Date: 03/03/21  Intake/Output from previous day: 04/27 0701 - 04/28 0700 In: 1230 [P.O.:30; I.V.:1100; IV Piggyback:100] Out: 0  Intake/Output this shift: No intake/output data recorded.  Bandage on the left foot is dry and intact.  Upon removal there is only mild bleeding.  No signs of purulence.  Incision is well coapted but there is some mild dusky appearance along the inferior flap of the incision.  Hopefully this is more just skin slough.  Patient undergoing vascular procedure tomorrow.  Erythema and edema significantly improved.      Lab Results:  Recent Labs    03/04/21 0504 03/05/21 0549  WBC 15.0* 13.2*  HGB 9.7* 10.6*  HCT 29.6* 33.0*  PLT 174 208   BMET Recent Labs    03/04/21 0504 03/05/21 0549  NA 137 140  K 3.4* 4.4  CL 104 110  CO2 24 21*  GLUCOSE 124* 184*  BUN 25* 20  CREATININE 1.44* 1.24*  CALCIUM 8.3* 8.5*   PT/INR Recent Labs    03/03/21 1917  LABPROT 14.9  INR 1.2   ABG No results for input(s): PHART, HCO3 in the last 72 hours.  Invalid input(s): PCO2, PO2  Studies/Results: MR FOOT LEFT WO CONTRAST  Result Date: 03/04/2021 CLINICAL DATA:  Diabetic foot ulcer EXAM: MRI OF THE LEFT FOOT WITHOUT CONTRAST TECHNIQUE: Multiplanar, multisequence MR imaging of the left forefoot was performed. No intravenous contrast was administered. COMPARISON:  X-ray 03/03/2021 FINDINGS: Bones/Joint/Cartilage Bone marrow edema within the fifth metatarsal head and base of the fifth toe proximal phalanx (series 8, images 13-17) with subtle intermediate T1 marrow signal  relative to the adjacent bony structures. No erosion. Trace fifth MTP joint effusion. Remaining osseous structures are intact. No fracture or dislocation. Mild degenerative changes within the left forefoot including the first MTP joint and tarsometatarsal joints. Ligaments Intact Lisfranc ligament. Collateral ligaments of the forefoot appear intact. Muscles and Tendons Denervation changes of the intrinsic foot musculature. Mild diffuse edema-like signal throughout the musculature which could reflect a component of myositis. Intact flexor and extensor tendons. No tenosynovial fluid collection. Soft tissues Soft tissue swelling and edema centered at the level of the fifth MTP joint. There are a few scattered areas of susceptibility artifact within the soft tissues corresponding to air seen on previous radiographs (series 3, images 27 and 29). No organized or drainable fluid collection. Prominent subcutaneous edema overlies the dorsum of the foot. IMPRESSION: 1. Bone marrow edema within the fifth metatarsal head and base of the fifth toe proximal phalanx with subtle T1 marrow signal changes. Findings are favored to represent early acute osteomyelitis. 2. Findings of cellulitis centered at the level of the fifth MTP joint. There are a few scattered areas of susceptibility artifact within the soft tissues corresponding to air seen on previous radiographs. No organized or drainable fluid collection. 3. Mild diffuse edema-like signal throughout the musculature which may reflect denervation changes and/or myositis. Electronically Signed   By: Davina Poke D.O.   On: 03/04/2021 08:26   DG Chest Port 1 View  Result Date: 03/03/2021 CLINICAL DATA:  70 year old female with concern for sepsis. EXAM: PORTABLE CHEST 1 VIEW COMPARISON:  Chest radiograph dated 06/26/2019. FINDINGS: The lungs are clear. There is no pleural effusion pneumothorax. The cardiac silhouette is within limits. No acute osseous pathology. IMPRESSION:  No active disease. Electronically Signed   By: Anner Crete M.D.   On: 03/03/2021 19:41   DG Foot Complete Left  Result Date: 03/03/2021 CLINICAL DATA:  Questionable sepsis. Left foot cellulitis. Open wound. EXAM: LEFT FOOT - COMPLETE 3+ VIEW COMPARISON:  None. FINDINGS: Soft tissue thickening with soft tissue air adjacent to the fifth metatarsal phalangeal joint. No radiopaque foreign body. No definite subjacent bony destructive change or erosion. Hammertoe deformity of the digits. Mild pes planus and degenerative change in the midfoot. Plantar calcaneal spur and fragmented Achilles tendon enthesophyte. Advanced vascular calcifications. Soft tissue edema overlies the dorsum of the foot. IMPRESSION: 1. Soft tissue thickening with soft tissue air adjacent to the fifth metatarsophalangeal joint suspicious for soft tissue infection. Recommend correlation for site of ulcer. No radiographic findings of osteomyelitis. 2. Advanced vascular calcifications. 3. Soft tissue edema overlies the dorsum of the foot. Electronically Signed   By: Keith Rake M.D.   On: 03/03/2021 19:43    Anti-infectives: Anti-infectives (From admission, onward)   Start     Dose/Rate Route Frequency Ordered Stop   03/05/21 2000  vancomycin (VANCOREADY) IVPB 1250 mg/250 mL  Status:  Discontinued        1,250 mg 166.7 mL/hr over 90 Minutes Intravenous Every 48 hours 03/03/21 2211 03/05/21 1002   03/05/21 2000  vancomycin (VANCOREADY) IVPB 750 mg/150 mL        750 mg 150 mL/hr over 60 Minutes Intravenous Every 24 hours 03/05/21 1002     03/04/21 2147  vancomycin (VANCOCIN) powder  Status:  Discontinued          As needed 03/04/21 2147 03/04/21 2214   03/04/21 1000  ceFEPIme (MAXIPIME) 2 g in sodium chloride 0.9 % 100 mL IVPB        2 g 200 mL/hr over 30 Minutes Intravenous Every 12 hours 03/03/21 2132     03/03/21 2130  vancomycin (VANCOCIN) IVPB 1000 mg/200 mL premix  Status:  Discontinued        1,000 mg 200 mL/hr over  60 Minutes Intravenous  Once 03/03/21 2123 03/03/21 2140   03/03/21 2130  ceFEPIme (MAXIPIME) 2 g in sodium chloride 0.9 % 100 mL IVPB  Status:  Discontinued        2 g 200 mL/hr over 30 Minutes Intravenous  Once 03/03/21 2123 03/03/21 2140   03/03/21 2000  ceFEPIme (MAXIPIME) 2 g in sodium chloride 0.9 % 100 mL IVPB        2 g 200 mL/hr over 30 Minutes Intravenous  Once 03/03/21 1957 03/03/21 2105   03/03/21 1930  vancomycin (VANCOREADY) IVPB 2000 mg/400 mL        2,000 mg 200 mL/hr over 120 Minutes Intravenous  Once 03/03/21 1912 03/03/21 2234      Assessment/Plan: s/p Procedure(s): AMPUTATION RAY (Left) Assessment: Stable status post fifth ray resection left foot.   Plan: Betadine and a sterile bandage reapplied to the left foot.  Again patient undergoing vascular procedure tomorrow.  Plan for dressing change on Saturday.  Patient may be partial weightbearing with assistance in a surgical shoe with pressure only on the heel.  LOS: 2 days    Durward Fortes 03/05/2021

## 2021-03-05 NOTE — Evaluation (Signed)
Physical Therapy Evaluation Patient Details Name: Elizabeth [name] MRN: [id number] DOB: 1951/05/07 Today's Date: 03/05/2021   History of Present Illness  Elizabeth [name] is a 70 y.o. Caucasian female with medical history significant for type diabetes mellitus, hypertension, lymphedema, CVA and psoriasis, who presented to the emergency room with acute onset of worsening left foot erythema, swelling, pain and tenderness for the last couple of days.  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  Clinical Impression  Patient received in bed, agrees to PT assessment. Patient reports no pain, gave patient post op shoe per Dr. Cleda Mccreedy request. She is mod independent with bed mobility. Transfers with min assist from slightly elevated bed. She ambulated from bed to recliner 4 feet with RW, PWB on heel, and min assist. Patient is unsteady with ambulation demonstrating post lean and decreased coordination. Patient will continue to benefit from skilled PT while here to improve functional mobility and independence.     Follow Up Recommendations Home health PT;Supervision/Assistance - 24 hour    Equipment Recommendations  None recommended by PT    Recommendations for Other Services       Precautions / Restrictions Precautions Precautions: Fall Restrictions Weight Bearing Restrictions: Yes LLE Weight Bearing: Partial weight bearing LLE Partial Weight Bearing Percentage or Pounds: heel weight bearing with post op shoe      Mobility  Bed Mobility Overal bed mobility: Modified Independent                  Transfers Overall transfer level: Needs assistance Equipment used: Rolling walker (2 wheeled) Transfers: Sit to/from Stand Sit to Stand: Min assist            Ambulation/Gait Ambulation/Gait assistance: Herbalist (Feet): 4 Feet Assistive device: Rolling walker (2 wheeled) Gait Pattern/deviations: Step-to pattern;Decreased step length - right;Decreased step length  - left;Leaning posteriorly Gait velocity: decr   General Gait Details: poor balance with ambulation, legs buckling, leaning posteriorly- would benefit from having her shoe on right foot to balance her out  Stairs            Wheelchair Mobility    Modified Rankin (Stroke Patients Only)       Balance Overall balance assessment: Needs assistance Sitting-balance support: Feet supported Sitting balance-Leahy Scale: Good     Standing balance support: Bilateral upper extremity supported;During functional activity Standing balance-Leahy Scale: Poor Standing balance comment: poor balance with mobility, requires AD and min assist                             Pertinent Vitals/Pain Pain Assessment: No/denies pain    Home Living Family/patient expects to be discharged to:: Private residence Living Arrangements: Spouse/significant other;Children Available Help at Discharge: Family;Available PRN/intermittently Type of Home: House Home Access: Stairs to enter Entrance Stairs-Rails: Right;Left;Can reach both Entrance Stairs-Number of Steps: 3 Home Layout: One level Home Equipment: Walker - 4 wheels;Cane - single point Additional Comments: used cane prior    Prior Function Level of Independence: Independent with assistive device(s)         Comments: Patient reports her husband helps her some with bathing and dressing.     Hand Dominance        Extremity/Trunk Assessment   Upper Extremity Assessment Upper Extremity Assessment: Overall WFL for tasks assessed    Lower Extremity Assessment Lower Extremity Assessment: Generalized weakness    Cervical / Trunk Assessment Cervical / Trunk Assessment: Normal  Communication   Communication: No difficulties  Cognition Arousal/Alertness: Awake/alert Behavior During Therapy: WFL for tasks assessed/performed Overall Cognitive Status: Within Functional Limits for tasks assessed                                         General Comments      Exercises     Assessment/Plan    PT Assessment Patient needs continued PT services  PT Problem List Decreased strength;Decreased mobility;Decreased activity tolerance;Decreased balance;Decreased coordination;Decreased safety awareness       PT Treatment Interventions DME instruction;Therapeutic exercise;Stair training;Gait training;Balance training;Functional mobility training;Therapeutic activities;Patient/family education    PT Goals (Current goals can be found in the Care Plan section)  Acute Rehab PT Goals Patient Stated Goal: none stated PT Goal Formulation: With patient Time For Goal Achievement: 03/19/21    Frequency Min 2X/week   Barriers to discharge Inaccessible home environment      Co-evaluation               AM-PAC PT "6 Clicks" Mobility  Outcome Measure Help needed turning from your back to your side while in a flat bed without using bedrails?: None Help needed moving from lying on your back to sitting on the side of a flat bed without using bedrails?: None Help needed moving to and from a bed to a chair (including a wheelchair)?: A Lot Help needed standing up from a chair using your arms (e.g., wheelchair or bedside chair)?: A Little Help needed to walk in hospital room?: A Lot Help needed climbing 3-5 steps with a railing? : A Lot 6 Click Score: 17    End of Session Equipment Utilized During Treatment: Gait belt Activity Tolerance: Patient limited by fatigue Patient left: in chair;with call bell/phone within reach;with chair alarm set Nurse Communication: Mobility status;Weight bearing status;Precautions PT Visit Diagnosis: Unsteadiness on feet (R26.81);Other abnormalities of gait and mobility (R26.89);Muscle weakness (generalized) (M62.81);Difficulty in walking, not elsewhere classified (R26.2)    Time: 7412-8786 PT Time Calculation (min) (ACUTE ONLY): 29 min   Charges:   PT Evaluation $PT Eval  Moderate Complexity: 1 Mod PT Treatments $Gait Training: 8-22 mins        Elizabeth [name], PT, GCS 03/05/21,2:47 PM

## 2021-03-05 NOTE — Plan of Care (Signed)

## 2021-03-05 NOTE — TOC Progression Note (Addendum)
Transition of Care [facility]) - Progression Note    Patient Details  Name: Elizabeth [name] MRN: [id number] Date of Birth: July 22, 1951  Transition of [facility] CM/SW Contact  Su Hilt, RN Phone Number: 03/05/2021, 4:23 PM  Clinical Narrative:   Met with the patient to discuss DC plan and needs She lives at home with her husband She has a RW a shower chair and grab bars at home She needs a 3 in 1 [employer] will take to the room prior to DC [place] from [facility] notified me that they could not take the patient, [facility] has accepted the patient         Expected Discharge Plan and Services                                                 Social Determinants of Health (SDOH) Interventions    Readmission Risk Interventions No flowsheet data found.

## 2021-03-05 NOTE — Consult Note (Signed)
[facility] Vascular Consult Note  MRN : [id number]  Elizabeth [name] is a 70 y.o. (08-24-1951) female who presents with chief complaint of  Chief Complaint  Patient presents with  . Foot Pain   History of Present Illness: Jayley Hustead is a 70 year old caucasian female with medical history significant for type diabetes mellitus, hypertension, lymphedema, CVA and psoriasis, who presented to the emergency room with acute onset of worsening left foot erythema, swelling, pain and tenderness for the last couple of days.    She denies any fever or chills.  No nausea or vomiting or abdominal pain.  No chest pain or palpitations.  No cough or wheezing.  No dysuria, oliguria or hematuria or flank pain.    ED Course:  When she came to the ER blood pressure was 98/68 with a temperature of 100 with otherwise normal vital signs.  After hydration blood pressure was up to 128/69.  Lab revealed mild hyponatremia and hypokalemia a BUN of 27 and creatinine 1.59 with low magnesium of 1.2.  Lactic acid was 2.1 and later 1.4.  CBC showed leukocytosis 19.3 with neutrophilia and anemia  EKG as reviewed by me:  EKG showed sinus rhythm with rate of 88 with premature supraventricular complexes and slightly poor R wave progression  Imaging:  Chest x-ray showed no acute cardiopulmonary disease. Left foot x-ray showed the following: 1. Soft tissue thickening with soft tissue air adjacent to the fifth metatarsophalangeal joint suspicious for soft tissue infection. Recommend correlation for site of ulcer. No radiographic findings of osteomyelitis. 2. Advanced vascular calcifications. 3. Soft tissue edema overlies the dorsum of the foot.  Patient was given IV vancomycin and cefepime, 1 L bolus of IV lactated Ringer, 2 g of IV magnesium sulfate and 40 mEq p.o. potassium chloride as well as Tdap booster.  She will be admitted to a medical bed for further evaluation and management.     Podiatry was consulted due to the patient having a nonhealing wound to the left foot who noted hard to palpate pedal pulses and therefore vascular surgery was consulted for possible endovascular intervention.  Current Facility-Administered Medications  Medication Dose Route Frequency Provider Last Rate Last Admin  . acetaminophen (TYLENOL) tablet 650 mg  650 mg Oral Q6H PRN Mansy, Jan A, MD       Or  . acetaminophen (TYLENOL) suppository 650 mg  650 mg Rectal Q6H PRN Mansy, Jan A, MD      . albuterol (VENTOLIN HFA) 108 (90 Base) MCG/ACT inhaler 2 puff  2 puff Inhalation Q6H PRN Mansy, Jan A, MD      . calcium-vitamin D (OSCAL WITH D) 500-200 MG-UNIT per tablet 2 tablet  2 tablet Oral Q breakfast Mansy, Jan A, MD   2 tablet at 03/05/21 0840  . ceFEPIme (MAXIPIME) 2 g in sodium chloride 0.9 % 100 mL IVPB  2 g Intravenous Q12H Benita Gutter, RPH 200 mL/hr at 03/05/21 0847 2 g at 03/05/21 0847  . enoxaparin (LOVENOX) injection 42.5 mg  0.5 mg/kg Subcutaneous Q24H Enzo Bi, MD      . gabapentin (NEURONTIN) capsule 300 mg  300 mg Oral TID Mansy, Jan A, MD   300 mg at 03/05/21 0840  . HYDROcodone-acetaminophen (NORCO/VICODIN) 5-325 MG per tablet 1-2 tablet  1-2 tablet Oral Q4H PRN Sharlotte Alamo, DPM      . insulin aspart (novoLOG) injection 0-15 Units  0-15 Units Subcutaneous TID PC & HS Mansy, Arvella Merles, MD  3 Units at 03/05/21 0840  . labetalol (NORMODYNE) injection 20 mg  20 mg Intravenous Q3H PRN Mansy, Jan A, MD      . linagliptin (TRADJENTA) tablet 5 mg  5 mg Oral Daily Mansy, Jan A, MD   5 mg at 03/05/21 0840  . magnesium hydroxide (MILK OF MAGNESIA) suspension 30 mL  30 mL Oral Daily PRN Mansy, Jan A, MD      . morphine 2 MG/ML injection 2 mg  2 mg Intravenous Q2H PRN Sharlotte Alamo, DPM      . ondansetron [facility]) tablet 4 mg  4 mg Oral Q6H PRN Mansy, Jan A, MD       Or  . ondansetron [facility]) injection 4 mg  4 mg Intravenous Q6H PRN Mansy, Jan A, MD      . PARoxetine (PAXIL) tablet 40 mg  40  mg Oral Daily Mansy, Jan A, MD   40 mg at 03/05/21 0840  . potassium chloride SA (KLOR-CON) CR tablet 20 mEq  20 mEq Oral Daily Mansy, Jan A, MD   20 mEq at 03/05/21 0840  . pravastatin (PRAVACHOL) tablet 40 mg  40 mg Oral QHS Mansy, Jan A, MD   40 mg at 03/04/21 0042  . traZODone (DESYREL) tablet 25 mg  25 mg Oral QHS PRN Mansy, Jan A, MD   25 mg at 03/04/21 0042  . vancomycin (VANCOREADY) IVPB 750 mg/150 mL  750 mg Intravenous Q24H Enzo Bi, MD       Past Medical History:  Diagnosis Date  . Cancer [facility])    breast  . Carcinoma of right breast ([place]) 05/20/2014  . Diabetes mellitus without complication ([place])   . Hypertension   . Lymphedema   . Personal history of radiation therapy 2015   Right breast  . PONV (postoperative nausea and vomiting)   . Psoriasis 1990  . Stroke ([place]) 2000   residual left arm weakness  . Varicose veins    Past Surgical History:  Procedure Laterality Date  . AMPUTATION Left 03/04/2021   Procedure: AMPUTATION RAY;  Surgeon: Sharlotte Alamo, DPM;  Location: [facility] ORS;  Service: Podiatry;  Laterality: Left;  . AMPUTATION TOE Right 01/25/2017   Procedure: AMPUTATION TOE   ;  Surgeon: Albertine Patricia, DPM;  Location: [facility] ORS;  Service: Podiatry;  Laterality: Right;  . BREAST EXCISIONAL BIOPSY Right 2015   [facility] / with radiation  . BREAST LUMPECTOMY Right 04-2014   followed by radiation,  no chemo  . BREAST REDUCTION SURGERY  1977  . CHOLECYSTECTOMY N/A 09/23/2015   Procedure: LAPAROSCOPIC CHOLECYSTECTOMY;  Surgeon: Bonner Puna, MD;  Location: [facility] ORS;  Service: General;  Laterality: N/A;  . DILATION AND CURETTAGE OF UTERUS  2010  . EYE SURGERY Left 2015   cataracts and left eye detached retina repair  . EYE SURGERY Right 2013   cataract with len implant  . GASTRIC ROUX-EN-Y N/A 09/23/2015   Procedure: LAPAROSCOPIC ROUX-EN-Y GASTRIC;  Surgeon: Bonner Puna, MD;  Location: [facility] ORS;  Service: General;  Laterality: N/A;  . HIATAL HERNIA REPAIR N/A 09/23/2015    Procedure: LAPAROSCOPIC REPAIR OF HIATAL HERNIA;  Surgeon: Bonner Puna, MD;  Location: [facility] ORS;  Service: General;  Laterality: N/A;  . IRRIGATION AND DEBRIDEMENT FOOT Right 01/25/2017   Procedure: IRRIGATION AND DEBRIDEMENT FOOT;  Surgeon: Albertine Patricia, DPM;  Location: [facility] ORS;  Service: Podiatry;  Laterality: Right;  . IRRIGATION AND DEBRIDEMENT FOOT Right 01/29/2017   Procedure: IRRIGATION AND DEBRIDEMENT FOOT  and application of wound vac;  Surgeon: Albertine Patricia, DPM;  Location: [facility] ORS;  Service: Podiatry;  Laterality: Right;  . LOWER EXTREMITY ANGIOGRAPHY Right 01/28/2017   Procedure: Lower Extremity Angiography;  Surgeon: Katha Cabal, MD;  Location: [facility];  Service: Cardiovascular;  Laterality: Right;  . LOWER EXTREMITY INTERVENTION  01/28/2017   Procedure: Lower Extremity Intervention;  Surgeon: Katha Cabal, MD;  Location: [facility];  Service: Cardiovascular;;  . REDUCTION MAMMAPLASTY     Social History Social History   Tobacco Use  . Smoking status: Never Smoker  . Smokeless tobacco: Never Used  Substance Use Topics  . Alcohol use: No    Alcohol/week: 0.0 standard drinks  . Drug use: No   Family History Family History  Problem Relation Age of Onset  . Diabetes Mother   . Cancer Mother   . Breast cancer Mother 67  . Diabetes Father   . Diabetes Sister   . Diabetes Brother   Denies family history of peripheral artery disease, venous disease or renal disease.  No Known Allergies  REVIEW OF SYSTEMS (Negative unless checked)  Constitutional: [] Weight loss  [] Fever  [] Chills Cardiac: [] Chest pain   [] Chest pressure   [] Palpitations   [] Shortness of breath when laying flat   [] Shortness of breath at rest   [] Shortness of breath with exertion. Vascular:  [] Pain in legs with walking   [] Pain in legs at rest   [] Pain in legs when laying flat   [] Claudication   [x] Pain in feet when walking  [x] Pain in feet at rest  [x] Pain in feet when  laying flat   [] History of DVT   [] Phlebitis   [] Swelling in legs   [] Varicose veins   [] Non-healing ulcers Pulmonary:   [] Uses home oxygen   [] Productive cough   [] Hemoptysis   [] Wheeze  [] COPD   [] Asthma Neurologic:  [] Dizziness  [] Blackouts   [] Seizures   [] History of stroke   [] History of TIA  [] Aphasia   [] Temporary blindness   [] Dysphagia   [] Weakness or numbness in arms   [] Weakness or numbness in legs Musculoskeletal:  [] Arthritis   [] Joint swelling   [] Joint pain   [] Low back pain Hematologic:  [] Easy bruising  [] Easy bleeding   [] Hypercoagulable state   [] Anemic  [] Hepatitis Gastrointestinal:  [] Blood in stool   [] Vomiting blood  [] Gastroesophageal reflux/heartburn   [] Difficulty swallowing. Genitourinary:  [x] Chronic kidney disease   [] Difficult urination  [] Frequent urination  [] Burning with urination   [] Blood in urine Skin:  [] Rashes   [] Ulcers   [] Wounds Psychological:  [] History of anxiety   []  History of major depression.  Physical Examination  Vitals:   03/04/21 2250 03/04/21 2308 03/05/21 0209 03/05/21 0452  BP: (!) 128/57 (!) 119/50 (!) 116/59 114/72  Pulse: (!) 37 96 72 69  Resp: 18 17 18 18   Temp:  98.1 F (36.7 C) 98.1 F (36.7 C) 97.9 F (36.6 C)  TempSrc:      SpO2: 96% 96% 96% 95%  Weight:      Height:       Body mass index is 33.66 kg/m. Gen:  WD/WN, NAD Head: [place]/AT, No temporalis wasting. Prominent temp pulse not noted. Ear/Nose/Throat: Hearing grossly intact, nares w/o erythema or drainage, oropharynx w/o Erythema/Exudate Eyes: Sclera non-icteric, conjunctiva clear Neck: Trachea midline.  No JVD.  Pulmonary:  Good air movement, respirations not labored, equal bilaterally.  Cardiac: RRR, normal S1, S2. Vascular:  Vessel Right Left  Radial Palpable  Palpable  Ulnar Palpable Palpable  Brachial Palpable Palpable  Carotid Palpable, without bruit Palpable, without bruit  Aorta Not palpable N/A  Femoral Palpable Palpable  Popliteal Palpable Palpable   PT Palpable Non-Palpable  DP Palpable Non-Palpable   Left lower extremity: Thigh soft. Calf soft. Hard to palpate pedal pulses.    Media Information         Media Information        Gastrointestinal: soft, non-tender/non-distended. No guarding/reflex.  Musculoskeletal: M/S 5/5 throughout.  Extremities without ischemic changes.  No deformity or atrophy. No edema. Neurologic: Sensation grossly intact in extremities.  Symmetrical.  Speech is fluent. Motor exam as listed above. Psychiatric: Judgment intact, Mood & affect appropriate for pt's clinical situation. Dermatologic: As above Lymph : No Cervical, Axillary, or Inguinal lymphadenopathy.  CBC Lab Results  Component Value Date   WBC 13.2 (H) 03/05/2021   HGB 10.6 (L) 03/05/2021   HCT 33.0 (L) 03/05/2021   MCV 85.9 03/05/2021   PLT 208 03/05/2021   BMET    Component Value Date/Time   NA 140 03/05/2021 0549   NA 139 11/18/2014 1011   K 4.4 03/05/2021 0549   K 3.7 11/18/2014 1011   CL 110 03/05/2021 0549   CL 100 11/18/2014 1011   CO2 21 (L) 03/05/2021 0549   CO2 35 (H) 11/18/2014 1011   GLUCOSE 184 (H) 03/05/2021 0549   GLUCOSE 70 11/18/2014 1011   BUN 20 03/05/2021 0549   BUN 22 (H) 11/18/2014 1011   CREATININE 1.24 (H) 03/05/2021 0549   CREATININE 0.88 11/18/2014 1011   CALCIUM 8.5 (L) 03/05/2021 0549   CALCIUM 9.0 11/18/2014 1011   GFRNONAA 47 (L) 03/05/2021 0549   GFRNONAA >60 11/18/2014 1011   GFRNONAA >60 01/04/2014 1825   GFRAA 54 (L) 06/27/2019 0506   GFRAA >60 11/18/2014 1011   GFRAA >60 01/04/2014 1825   Estimated Creatinine Clearance: 44.5 mL/min (A) (by C-G formula based on SCr of 1.24 mg/dL (H)).  COAG Lab Results  Component Value Date   INR 1.2 03/03/2021   INR 1.2 06/27/2019   Radiology MR FOOT LEFT WO CONTRAST  Result Date: 03/04/2021 CLINICAL DATA:  Diabetic foot ulcer EXAM: MRI OF THE LEFT FOOT WITHOUT CONTRAST TECHNIQUE: Multiplanar, multisequence MR imaging of the left  forefoot was performed. No intravenous contrast was administered. COMPARISON:  X-ray 03/03/2021 FINDINGS: Bones/Joint/Cartilage Bone marrow edema within the fifth metatarsal head and base of the fifth toe proximal phalanx (series 8, images 13-17) with subtle intermediate T1 marrow signal relative to the adjacent bony structures. No erosion. Trace fifth MTP joint effusion. Remaining osseous structures are intact. No fracture or dislocation. Mild degenerative changes within the left forefoot including the first MTP joint and tarsometatarsal joints. Ligaments Intact Lisfranc ligament. Collateral ligaments of the forefoot appear intact. Muscles and Tendons Denervation changes of the intrinsic foot musculature. Mild diffuse edema-like signal throughout the musculature which could reflect a component of myositis. Intact flexor and extensor tendons. No tenosynovial fluid collection. Soft tissues Soft tissue swelling and edema centered at the level of the fifth MTP joint. There are a few scattered areas of susceptibility artifact within the soft tissues corresponding to air seen on previous radiographs (series 3, images 27 and 29). No organized or drainable fluid collection. Prominent subcutaneous edema overlies the dorsum of the foot. IMPRESSION: 1. Bone marrow edema within the fifth metatarsal head and base of the fifth toe proximal phalanx with subtle T1 marrow signal changes. Findings are favored to represent early  acute osteomyelitis. 2. Findings of cellulitis centered at the level of the fifth MTP joint. There are a few scattered areas of susceptibility artifact within the soft tissues corresponding to air seen on previous radiographs. No organized or drainable fluid collection. 3. Mild diffuse edema-like signal throughout the musculature which may reflect denervation changes and/or myositis. Electronically Signed   By: Davina Poke D.O.   On: 03/04/2021 08:26   DG Chest Port 1 View  Result Date:  03/03/2021 CLINICAL DATA:  70 year old female with concern for sepsis. EXAM: PORTABLE CHEST 1 VIEW COMPARISON:  Chest radiograph dated 06/26/2019. FINDINGS: The lungs are clear. There is no pleural effusion pneumothorax. The cardiac silhouette is within limits. No acute osseous pathology. IMPRESSION: No active disease. Electronically Signed   By: Anner Crete M.D.   On: 03/03/2021 19:41   DG Foot Complete Left  Result Date: 03/03/2021 CLINICAL DATA:  Questionable sepsis. Left foot cellulitis. Open wound. EXAM: LEFT FOOT - COMPLETE 3+ VIEW COMPARISON:  None. FINDINGS: Soft tissue thickening with soft tissue air adjacent to the fifth metatarsal phalangeal joint. No radiopaque foreign body. No definite subjacent bony destructive change or erosion. Hammertoe deformity of the digits. Mild pes planus and degenerative change in the midfoot. Plantar calcaneal spur and fragmented Achilles tendon enthesophyte. Advanced vascular calcifications. Soft tissue edema overlies the dorsum of the foot. IMPRESSION: 1. Soft tissue thickening with soft tissue air adjacent to the fifth metatarsophalangeal joint suspicious for soft tissue infection. Recommend correlation for site of ulcer. No radiographic findings of osteomyelitis. 2. Advanced vascular calcifications. 3. Soft tissue edema overlies the dorsum of the foot. Electronically Signed   By: Keith Rake M.D.   On: 03/03/2021 19:43   Assessment/Plan Shenna Brissette is a 70 year old caucasian female with medical history significant for type diabetes mellitus, hypertension, lymphedema, CVA and psoriasis, who presented to the emergency room with acute onset of worsening left foot erythema, swelling, pain and tenderness for the last couple of days.  1.  Atherosclerotic Disease with Chronic Wound: Patient presents with chronic wounds at the bottom of the left foot.  The patient has multiple risk factors for atherosclerotic disease and in the setting of chronic wound  without any signs of healing would recommend the patient undergo a left lower extremity angiogram with possible intervention and attempt to assess the patient's anatomy and contributing degree of atherosclerotic disease.  If appropriate, an attempt to revascularize the leg can be made at that time.  Procedure, risks and benefits were explained to the patient.  All questions were answered.  The patient wishes to proceed.  We will plan on angiogram on Friday with Dr. Delana Meyer  2.  Diabetes: On appropriate medications Encouraged good control as its slows the progression of atherosclerotic disease.  3.  Hyperlipidemia: Patient is currently on Plavix and a statin for medical management Would also add aspirin to the patient's medical management. Encouraged good control as its slows the progression of atherosclerotic disease.  4. Chronic Kidney Disease: Patient with known history of chronic kidney disease In an attempt to try to preserve kidney function will try to use least amount of contrast as possible Recommend follow-up BMP status post the procedure to monitor kidney function  Discussed with Dr. Francene Castle, PA-C  03/05/2021 11:24 AM  This note was created with Dragon medical transcription system.  Any error is purely unintentional

## 2021-03-06 ENCOUNTER — Encounter: Payer: Self-pay | Admitting: Family Medicine

## 2021-03-06 ENCOUNTER — Other Ambulatory Visit: Payer: Self-pay

## 2021-03-06 ENCOUNTER — Encounter
Admission: EM | Disposition: A | Discharge status: Home / Self Care | Payer: Self-pay | Source: Home / Self Care | Attending: Hospitalist

## 2021-03-06 DIAGNOSIS — L03119 Cellulitis of unspecified part of limb: Secondary | ICD-10-CM | POA: Diagnosis not present

## 2021-03-06 DIAGNOSIS — E11628 Type 2 diabetes mellitus with other skin complications: Secondary | ICD-10-CM | POA: Diagnosis not present

## 2021-03-06 DIAGNOSIS — I70245 Atherosclerosis of native arteries of left leg with ulceration of other part of foot: Secondary | ICD-10-CM

## 2021-03-06 HISTORY — PX: LOWER EXTREMITY ANGIOGRAPHY: CATH118251

## 2021-03-06 LAB — CBC
HCT: 31.1 % — ABNORMAL LOW (ref 36.0–46.0)
Hemoglobin: 10.1 g/dL — ABNORMAL LOW (ref 12.0–15.0)
MCH: 27.7 pg (ref 26.0–34.0)
MCHC: 32.5 g/dL (ref 30.0–36.0)
MCV: 85.2 fL (ref 80.0–100.0)
Platelets: 251 10*3/uL (ref 150–400)
RBC: 3.65 MIL/uL — ABNORMAL LOW (ref 3.87–5.11)
RDW: 14.7 % (ref 11.5–15.5)
WBC: 12.9 10*3/uL — ABNORMAL HIGH (ref 4.0–10.5)
nRBC: 0 % (ref 0.0–0.2)

## 2021-03-06 LAB — BASIC METABOLIC PANEL
Anion gap: 6 (ref 5–15)
BUN: 21 mg/dL (ref 8–23)
CO2: 23 mmol/L (ref 22–32)
Calcium: 8.3 mg/dL — ABNORMAL LOW (ref 8.9–10.3)
Chloride: 111 mmol/L (ref 98–111)
Creatinine, Ser: 1 mg/dL (ref 0.44–1.00)
GFR, Estimated: >60 mL/min (ref >60)
Glucose, Bld: 132 mg/dL — ABNORMAL HIGH (ref 70–99)
Potassium: 3.9 mmol/L (ref 3.5–5.1)
Sodium: 140 mmol/L (ref 135–145)

## 2021-03-06 LAB — GLUCOSE, CAPILLARY
Glucose-Capillary: 132 mg/dL — ABNORMAL HIGH (ref 70–99)
Glucose-Capillary: 96 mg/dL (ref 70–99)
Glucose-Capillary: 101 mg/dL — ABNORMAL HIGH (ref 70–99)
Glucose-Capillary: 270 mg/dL — ABNORMAL HIGH (ref 70–99)

## 2021-03-06 LAB — MAGNESIUM: Magnesium: 2 mg/dL (ref 1.7–2.4)

## 2021-03-06 LAB — SURGICAL PATHOLOGY

## 2021-03-06 SURGERY — LOWER EXTREMITY ANGIOGRAPHY
Anesthesia: Moderate Sedation | Laterality: Left

## 2021-03-06 MED ORDER — MIDAZOLAM HCL 5 MG/5ML IJ SOLN
INTRAMUSCULAR | Status: AC
  Filled 2021-03-06: qty 5

## 2021-03-06 MED ORDER — ONDANSETRON HCL 4 MG/2ML IJ SOLN: 4.0000 mg | Freq: Four times a day (QID) | INTRAMUSCULAR | Status: DC | PRN

## 2021-03-06 MED ORDER — MORPHINE SULFATE (PF) 4 MG/ML IV SOLN: 2.0000 mg | INTRAVENOUS | Status: DC | PRN

## 2021-03-06 MED ORDER — SODIUM CHLORIDE 0.9 % IV SOLN
INTRAVENOUS | Status: AC
  Filled 2021-03-06: qty 10

## 2021-03-06 MED ORDER — DIPHENHYDRAMINE HCL 50 MG/ML IJ SOLN: 50.0000 mg | Freq: Once | INTRAMUSCULAR | Status: DC | PRN

## 2021-03-06 MED ORDER — SODIUM CHLORIDE 0.9% FLUSH
3.0000 mL | Freq: Two times a day (BID) | INTRAVENOUS | Status: DC
  Administered 2021-03-07: 3 mL via INTRAVENOUS

## 2021-03-06 MED ORDER — SODIUM CHLORIDE 0.9 % IV SOLN: INTRAVENOUS | Status: AC

## 2021-03-06 MED ORDER — OXYCODONE HCL 5 MG PO TABS: 5.0000 mg | ORAL_TABLET | ORAL | Status: DC | PRN

## 2021-03-06 MED ORDER — HEPARIN SODIUM (PORCINE) 1000 UNIT/ML IJ SOLN
INTRAMUSCULAR | Status: AC
  Filled 2021-03-06: qty 1

## 2021-03-06 MED ORDER — HYDROMORPHONE HCL 1 MG/ML IJ SOLN: 1.0000 mg | Freq: Once | INTRAMUSCULAR | Status: DC | PRN

## 2021-03-06 MED ORDER — METHYLPREDNISOLONE SODIUM SUCC 125 MG IJ SOLR: 125.0000 mg | Freq: Once | INTRAMUSCULAR | Status: DC | PRN

## 2021-03-06 MED ORDER — SODIUM CHLORIDE 0.9 % IV SOLN: 1.0000 g | Freq: Once | INTRAVENOUS | Status: DC

## 2021-03-06 MED ORDER — MIDAZOLAM HCL 2 MG/2ML IJ SOLN
INTRAMUSCULAR | Status: DC | PRN
  Administered 2021-03-06 (×2): 1 mg via INTRAVENOUS

## 2021-03-06 MED ORDER — FENTANYL CITRATE (PF) 100 MCG/2ML IJ SOLN
INTRAMUSCULAR | Status: AC
  Filled 2021-03-06: qty 2

## 2021-03-06 MED ORDER — SODIUM CHLORIDE 0.9 % IV SOLN: INTRAVENOUS | Status: DC

## 2021-03-06 MED ORDER — SODIUM CHLORIDE 0.9% FLUSH: 3.0000 mL | INTRAVENOUS | Status: DC | PRN

## 2021-03-06 MED ORDER — FAMOTIDINE 20 MG PO TABS: 40.0000 mg | ORAL_TABLET | Freq: Once | ORAL | Status: DC | PRN

## 2021-03-06 MED ORDER — MIDAZOLAM HCL 2 MG/ML PO SYRP: 8.0000 mg | ORAL_SOLUTION | Freq: Once | ORAL | Status: DC | PRN

## 2021-03-06 MED ORDER — LABETALOL HCL 5 MG/ML IV SOLN
10.0000 mg | INTRAVENOUS | Status: DC | PRN
Start: 2021-03-06 — End: 2021-03-07

## 2021-03-06 MED ORDER — ACETAMINOPHEN 325 MG PO TABS: 650.0000 mg | ORAL_TABLET | ORAL | Status: DC | PRN

## 2021-03-06 MED ORDER — FENTANYL CITRATE (PF) 100 MCG/2ML IJ SOLN
INTRAMUSCULAR | Status: DC | PRN
  Administered 2021-03-06 (×2): 25 ug via INTRAVENOUS

## 2021-03-06 MED ORDER — HYDRALAZINE HCL 20 MG/ML IJ SOLN: 5.0000 mg | INTRAMUSCULAR | Status: DC | PRN

## 2021-03-06 MED ORDER — SODIUM CHLORIDE 0.9 % IV SOLN: 250.0000 mL | INTRAVENOUS | Status: DC | PRN

## 2021-03-06 SURGICAL SUPPLY — 10 items
CANNULA 5F STIFF (CANNULA) ×2 IMPLANT
CATH ANGIO 5F PIGTAIL 65CM (CATHETERS) ×2 IMPLANT
COVER PROBE U/S 5X48 (MISCELLANEOUS) ×2 IMPLANT
DEVICE STARCLOSE SE CLOSURE (Vascular Products) ×2 IMPLANT
GLIDEWIRE ADV .035X260CM (WIRE) ×2 IMPLANT
PACK ANGIOGRAPHY (CUSTOM PROCEDURE TRAY) ×2 IMPLANT
SHEATH BRITE TIP 5FRX11 (SHEATH) ×2 IMPLANT
SYR MEDRAD MARK 7 150ML (SYRINGE) ×2 IMPLANT
TUBING CONTRAST HIGH PRESS 48 (TUBING) ×4 IMPLANT
WIRE GUIDERIGHT .035X150 (WIRE) ×2 IMPLANT

## 2021-03-06 NOTE — Op Note (Signed)
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  Abdominal aortogram  2.  Left lower extremity distal runoff third order catheter placement  3.  Star close right common femoral   Anesthesia: Conscious sedation was administered by the interventional radiology RN under my direct supervision. IV Versed plus fentanyl were utilized. Continuous ECG, pulse oximetry and blood pressure was monitored throughout the entire procedure.  Conscious sedation was administered for a total of 30 minutes and 38 seconds.  Sheath: 5 French 11 cm Pinnacle right common femoral retrograde  Contrast: 40 cc   Fluoroscopy Time: 2.4 minutes  Indications:  The patient presents to [facility] with gangrenous changes to the left foot.  Pedal pulses are nonpalpable bilaterally suggesting atherosclerotic occlusive disease.  The risks and benefits as well as alternative therapies for lower extremity revascularization are reviewed with the patient all questions are answered the patient agrees to proceed.  The patient is therefore undergoing angiography with the hope for intervention for limb salvage.   Procedure:  Elizabeth [name] a 70 y.o. female who was identified and appropriate procedural time out was performed.  The patient was then placed supine on the table and prepped and draped in the usual sterile fashion.  Ultrasound was used to evaluate the right common femoral artery.  It was echolucent and pulsatile indicating it is patent .  An ultrasound image was acquired for the permanent record.  A micropuncture needle was used to access the right common femoral artery under direct ultrasound guidance.  The microwire was then advanced  under fluoroscopic guidance without difficulty followed by the micro-sheath.  A 0.035 J wire was advanced without resistance and a 5Fr sheath was placed.    Pigtail catheter was then advanced to the level of T12 and AP projection of the aorta was obtained. Pigtail catheter was then repositioned to above the bifurcation and RAO view of the pelvis was obtained. Stiff angled Glidewire and pigtail catheter was then used across the bifurcation and the catheter was positioned in the distal external iliac artery.  LAO of the left groin was then obtained. Wire was reintroduced and negotiated into the SFA and the catheter was advanced into the SFA. Distal runoff was then performed.  After review of the images the catheter was removed over wire and an RAO view of the groin was obtained. StarClose device was deployed without difficulty.   Findings:   Aortogram: Abdominal aortogram demonstrates widely patent aorta with mild atherosclerotic changes no evidence of hemodynamically significant stenosis.  The aortic bifurcation as well as the bilateral common internal and external iliac arteries are all widely patent and free of hemodynamically significant stenosis.  Right Lower Extremity: Visualized portions of the right common femoral profunda femoris and superficial femoral artery are widely patent  Left Lower Extremity: The left common femoral profundofemoral superficial femoral and popliteal arteries demonstrate mild to moderate atherosclerotic changes but there are no hemodynamically significant stenoses.  The anterior tibial is widely patent and dominant runoff to the foot filling the pedal arch via the dorsalis pedis.  The tibioperoneal trunk and peroneal are widely patent down to the ankle with moderate collaterals to the foot.  The posterior tibial occludes just after its origin and remains occluded throughout its entire course.   Disposition: Patient was  taken to the recovery room in stable condition having  tolerated the procedure well.  Belenda Cruise Shadrack Brummitt 03/06/2021,1:33 PM

## 2021-03-06 NOTE — Care Management Important Message (Signed)
Important Message  Patient Details  Name: Elizabeth [name] MRN: [id number] Date of Birth: December 27, 1950   Medicare Important Message Given:  Yes     Juliann Pulse A Telly Jawad 03/06/2021, 10:22 AM

## 2021-03-06 NOTE — Progress Notes (Signed)
Pt return to room post angiogram.  Drowsy, arouses to voice.  Denies pain.  R groin assessed with PACU RN, Elzie Rings, and bedside report received.  VSS. All patient necessities and call bell within reach.   Will continue to monitor.

## 2021-03-06 NOTE — Progress Notes (Signed)
Call from lab with results from bone fragment collected 4/26- Positive for Staph Aureus and Beta Strep B.  Dr. Billie Ruddy and Dr. Cleda Mccreedy notified via secure chat.

## 2021-03-07 LAB — BASIC METABOLIC PANEL
Anion gap: 5 (ref 5–15)
BUN: 21 mg/dL (ref 8–23)
CO2: 23 mmol/L (ref 22–32)
Calcium: 8.2 mg/dL — ABNORMAL LOW (ref 8.9–10.3)
Chloride: 113 mmol/L — ABNORMAL HIGH (ref 98–111)
Creatinine, Ser: 1.11 mg/dL — ABNORMAL HIGH (ref 0.44–1.00)
GFR, Estimated: 54 mL/min — ABNORMAL LOW (ref >60)
Glucose, Bld: 87 mg/dL (ref 70–99)
Potassium: 4.4 mmol/L (ref 3.5–5.1)
Sodium: 141 mmol/L (ref 135–145)

## 2021-03-07 LAB — CBC
HCT: 29.9 % — ABNORMAL LOW (ref 36.0–46.0)
Hemoglobin: 9.4 g/dL — ABNORMAL LOW (ref 12.0–15.0)
MCH: 27.3 pg (ref 26.0–34.0)
MCHC: 31.4 g/dL (ref 30.0–36.0)
MCV: 86.9 fL (ref 80.0–100.0)
Platelets: 251 10*3/uL (ref 150–400)
RBC: 3.44 MIL/uL — ABNORMAL LOW (ref 3.87–5.11)
RDW: 14.9 % (ref 11.5–15.5)
WBC: 10.1 10*3/uL (ref 4.0–10.5)
nRBC: 0 % (ref 0.0–0.2)

## 2021-03-07 LAB — MAGNESIUM: Magnesium: 1.8 mg/dL (ref 1.7–2.4)

## 2021-03-07 LAB — GLUCOSE, CAPILLARY: Glucose-Capillary: 89 mg/dL (ref 70–99)

## 2021-03-07 MED ORDER — AMOXICILLIN-POT CLAVULANATE 250-125 MG PO TABS
1.0000 | ORAL_TABLET | Freq: Three times a day (TID) | ORAL | Status: DC
  Administered 2021-03-07: 1 via ORAL
  Filled 2021-03-07 (×3): qty 1

## 2021-03-07 MED ORDER — ONDANSETRON HCL 4 MG PO TABS: 4.0000 mg | ORAL_TABLET | Freq: Four times a day (QID) | ORAL | 0 refills | Status: DC | PRN

## 2021-03-07 MED ORDER — AMOXICILLIN-POT CLAVULANATE 250-125 MG PO TABS: 1.0000 | ORAL_TABLET | Freq: Three times a day (TID) | ORAL | 0 refills | Status: DC

## 2021-03-07 MED ORDER — CIPROFLOXACIN HCL 500 MG PO TABS: 500.0000 mg | ORAL_TABLET | Freq: Two times a day (BID) | ORAL | 0 refills | Status: AC

## 2021-03-07 MED ORDER — CIPROFLOXACIN HCL 500 MG PO TABS: 750.0000 mg | ORAL_TABLET | Freq: Two times a day (BID) | ORAL | Status: DC

## 2021-03-07 MED ORDER — ACETAMINOPHEN 325 MG PO TABS: 650.0000 mg | ORAL_TABLET | Freq: Four times a day (QID) | ORAL | Status: DC | PRN

## 2021-03-07 MED ORDER — HYDROCODONE-ACETAMINOPHEN 5-325 MG PO TABS: 1.0000 | ORAL_TABLET | ORAL | 0 refills | Status: AC | PRN

## 2021-03-07 MED ORDER — CIPROFLOXACIN HCL 750 MG PO TABS: 750.0000 mg | ORAL_TABLET | Freq: Two times a day (BID) | ORAL | 0 refills | Status: DC

## 2021-03-07 MED ORDER — VANCOMYCIN HCL 750 MG IV SOLR
750.0000 mg | INTRAVENOUS | Status: DC
  Filled 2021-03-07: qty 750

## 2021-03-07 MED ORDER — AMOXICILLIN-POT CLAVULANATE 875-125 MG PO TABS: 1.0000 | ORAL_TABLET | Freq: Two times a day (BID) | ORAL | 0 refills | Status: AC

## 2021-03-07 NOTE — Progress Notes (Signed)
PROGRESS NOTE    Elizabeth [name]  [id number] DOB: [birth year] DOA: 03/03/2021 PCP: Marguerita Merles, MD  150A/150A-AA   Assessment & Plan:   Active Problems:   Cellulitis in diabetic foot ([place])   Elizabeth [name] is a 70 y.o. Caucasian female with medical history significant for type diabetes mellitus, hypertension, lymphedema, CVA and psoriasis, who presented to the emergency room with acute onset of worsening left foot erythema, swelling, pain and tenderness for the last couple of days.  She denies any fever or chills.   Full-thickness ulceration left foot with cellulitis. 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 --angiogram today found no significant stenosis  2.  Acute kidney injury superimposed on stage IIIa chronic kidney disease --s/p IVF --hold home Lisinopril --d/c MIVF and encourage oral hydration  3.  Essential hypertension. - intermittent low BP and HR --Hold home coreg and Lisinopril  4.  Type 2 diabetes mellitus  -Hold home metformin --cont Tradjenta --SSI  Diabetic peripheral neuropathy --cont gabapentin  hypokalemia and hypomagnesemia --monitor and replete PRN  5.  Dyslipidemia. -cont statin  6.  History of breast cancer. - home letrozole has been held  7.  Depression. - cont Paxil   DVT prophylaxis: Lovenox SQ Code Status: Full code  Family Communication:  Level of care: Med-Surg Dispo:   The patient is from: home Anticipated d/c is to: home Anticipated d/c date is: 1-2 days Patient currently is not medically ready to d/c due to: pending wound check and dressing change by podiatry   Subjective and Interval History:  Pt went for angiogram today, no significant stenosis that  required intervention.    No pain.     Objective: Vitals:   03/06/21 1410 03/06/21 1419 03/06/21 1628 03/06/21 1940  BP: 139/66 139/66 (!) 100/56 139/72  Pulse: 60 (!) 58 67 (!) 58  Resp: 12  18 17   Temp:   98.3 F (36.8 C) 97.6 F (36.4 C)  TempSrc:    Oral  SpO2: 97% 97% 100% 97%  Weight:      Height:        Intake/Output Summary (Last 24 hours) at 03/07/2021 0008 Last data filed at 03/06/2021 1832 Gross per 24 hour  Intake 495.19 ml  Output 1200 ml  Net -704.81 ml   Filed Weights   03/03/21 1848 03/06/21 1130  Weight: 86.2 kg 86.2 kg    Examination:   Constitutional: NAD, sleeping CV: No cyanosis.   RESP: normal respiratory effort, on RA Extremities: Left foot wrapped SKIN: warm, dry   Data Reviewed: I have personally reviewed following labs and imaging studies  CBC: Recent Labs  Lab 03/03/21 1917 03/04/21 0504 03/05/21 0549 03/06/21 0727  WBC 19.3* 15.0* 13.2* 12.9*  NEUTROABS 16.8*  --   --   --   HGB 10.8* 9.7* 10.6* 10.1*  HCT 33.7* 29.6* 33.0* 31.1*  MCV 85.1 84.6 85.9 85.2  PLT 202 174 208 [id number]   Basic Metabolic Panel: Recent Labs  Lab 03/03/21 1917 03/04/21 0504 03/05/21 0549 03/06/21 0727  NA 134* 137 140 140  K 3.4* 3.4* 4.4 3.9  CL 100 104 110 111  CO2 22 24 21* 23  GLUCOSE 145* 124* 184* 132*  BUN 27* 25* 20 21  CREATININE 1.59* 1.44*  1.24* 1.00  CALCIUM 8.5* 8.3* 8.5* 8.3*  MG 1.2*  --  2.4 2.0   GFR: Estimated Creatinine Clearance: 55.2 mL/min (by C-G formula based on SCr of 1 mg/dL). Liver Function Tests: Recent Labs  Lab 03/03/21 1917  AST 14*  ALT 10  ALKPHOS 73  BILITOT 0.9  PROT 6.7  ALBUMIN 3.2*   No results for input(s): LIPASE, AMYLASE in the last 168 hours. No results for input(s): AMMONIA in the last 168 hours. Coagulation Profile: Recent Labs  Lab 03/03/21 1917  INR 1.2   Cardiac Enzymes: No results for input(s): CKTOTAL, CKMB, CKMBINDEX, TROPONINI in the last 168 hours. BNP (last 3 results) No  results for input(s): PROBNP in the last 8760 hours. HbA1C: Recent Labs    03/04/21 0504  HGBA1C 5.6   CBG: Recent Labs  Lab 03/05/21 2118 03/06/21 0746 03/06/21 1138 03/06/21 1349 03/06/21 2055  GLUCAP 147* 132* 96 101* 270*   Lipid Profile: No results for input(s): CHOL, HDL, LDLCALC, TRIG, CHOLHDL, LDLDIRECT in the last 72 hours. Thyroid Function Tests: No results for input(s): TSH, T4TOTAL, FREET4, T3FREE, THYROIDAB in the last 72 hours. Anemia Panel: No results for input(s): VITAMINB12, FOLATE, FERRITIN, TIBC, IRON, RETICCTPCT in the last 72 hours. Sepsis Labs: Recent Labs  Lab 03/03/21 1917 03/03/21 2110 03/04/21 0504 03/05/21 0549  PROCALCITON 0.10  --  0.17 0.21  LATICACIDVEN 2.1* 1.4  --   --     Recent Results (from the past 240 hour(s))  Blood culture (routine x 2)     Status: None (Preliminary result)   Collection Time: 03/03/21  7:17 PM   Specimen: BLOOD  Result Value Ref Range Status   Specimen Description BLOOD RIGHT ANTECUBITAL  Final   Special Requests   Final    BOTTLES DRAWN AEROBIC AND ANAEROBIC Blood Culture adequate volume   Culture   Final    NO GROWTH 3 DAYS Performed at [facility], [address] [place], [place] [address]    Report Status PENDING  Incomplete  Blood culture (routine x 2)     Status: None (Preliminary result)   Collection Time: 03/03/21  7:17 PM   Specimen: BLOOD  Result Value Ref Range Status   Specimen Description BLOOD BLOOD RIGHT ARM  Final   Special Requests   Final    BOTTLES DRAWN AEROBIC AND ANAEROBIC Blood Culture adequate volume   Culture   Final    NO GROWTH 3 DAYS Performed at [facility], [address] [place], [place] [address]    Report Status PENDING  Incomplete  Resp Panel by RT-PCR (Flu A&B, Covid) Nasopharyngeal Swab     Status: None   Collection Time: 03/03/21  7:17 PM   Specimen: Nasopharyngeal Swab; Nasopharyngeal(NP) swabs in vial transport medium  Result Value Ref  Range Status   SARS Coronavirus 2 by RT PCR NEGATIVE NEGATIVE Final    Comment: (NOTE) SARS-CoV-2 target nucleic acids are NOT DETECTED.  The SARS-CoV-2 RNA is generally detectable in upper respiratory specimens during the acute phase of infection. The lowest concentration of SARS-CoV-2 viral copies this assay can detect is 138 copies/mL. A negative result does not preclude SARS-Cov-2 infection and should not be used as the sole basis for treatment or other patient management decisions. A negative result may occur with  improper specimen collection/handling, submission of specimen other than nasopharyngeal swab, presence of viral mutation(s) within the areas targeted by this assay, and inadequate number of viral copies(<138 copies/mL). A negative  result must be combined with clinical observations, patient history, and epidemiological information. The expected result is Negative.  Fact Sheet for Patients:  EntrepreneurPulse.com.au  Fact Sheet for Healthcare Providers:  IncredibleEmployment.be  This test is no t yet approved or cleared by the [place] FDA and  has been authorized for detection and/or diagnosis of SARS-CoV-2 by FDA under an Emergency Use Authorization (EUA). This EUA will remain  in effect (meaning this test can be used) for the duration of the COVID-19 declaration under Section 564(b)(1) of the Act, 21 U.S.C.section 360bbb-3(b)(1), unless the authorization is terminated  or revoked sooner.       Influenza A by PCR NEGATIVE NEGATIVE Final   Influenza B by PCR NEGATIVE NEGATIVE Final    Comment: (NOTE) The Xpert Xpress SARS-CoV-2/FLU/RSV plus assay is intended as an aid in the diagnosis of influenza from Nasopharyngeal swab specimens and should not be used as a sole basis for treatment. Nasal washings and aspirates are unacceptable for Xpert Xpress SARS-CoV-2/FLU/RSV testing.  Fact Sheet for  Patients: EntrepreneurPulse.com.au  Fact Sheet for Healthcare Providers: IncredibleEmployment.be  This test is not yet approved or cleared by the [place] FDA and has been authorized for detection and/or diagnosis of SARS-CoV-2 by FDA under an Emergency Use Authorization (EUA). This EUA will remain in effect (meaning this test can be used) for the duration of the COVID-19 declaration under Section 564(b)(1) of the Act, 21 U.S.C. section 360bbb-3(b)(1), unless the authorization is terminated or revoked.  Performed at [facility], [address] [place], [place] [address]   Urine culture     Status: Abnormal   Collection Time: 03/03/21  9:10 PM   Specimen: Urine, Random  Result Value Ref Range Status   Specimen Description   Final    URINE, RANDOM Performed at [facility], [address], [place], [place] [address]    Special Requests   Final    NONE Performed at [facility], [address] [place], [place] [address]    Culture MULTIPLE SPECIES PRESENT, SUGGEST RECOLLECTION (A)  Final   Report Status 03/05/2021 FINAL  Final  Aerobic/Anaerobic Culture w Gram Stain (surgical/deep wound)     Status: None (Preliminary result)   Collection Time: 03/04/21  9:38 PM   Specimen: PATH Other; Wound  Result Value Ref Range Status   Specimen Description   Final    WOUND LEFT FOOT Performed at [facility], [address] [place], [place] [address]    Special Requests   Final    NONE Performed at [facility], [address] [place], [place] [address]    Gram Stain   Final    NO WBC SEEN ABUNDANT GRAM POSITIVE COCCI MODERATE GRAM NEGATIVE RODS FEW GRAM POSITIVE RODS    Culture   Final    ABUNDANT STAPHYLOCOCCUS AUREUS RARE GRAM NEGATIVE RODS IDENTIFICATION AND SUSCEPTIBILITIES TO FOLLOW ABUNDANT STREPTOCOCCUS AGALACTIAE TESTING AGAINST S. AGALACTIAE NOT ROUTINELY PERFORMED DUE TO  PREDICTABILITY OF AMP/PEN/VAN SUSCEPTIBILITY. Performed at [facility], [place] [address] [place], [place] [address]    Report Status PENDING  Incomplete  Aerobic Culture w Gram Stain (superficial specimen)     Status: None (Preliminary result)   Collection Time: 03/04/21  9:44 PM   Specimen: PATH Bone biopsy; Other  Result Value Ref Range Status   Specimen Description   Final    BONE LEFT 5TH METATARSAL Performed at [facility], [address] [place], [place] [address]  Special Requests   Final    NONE Performed at [facility], [address] [place], [place] [address]    Gram Stain   Final    RARE WBC PRESENT, PREDOMINANTLY PMN NO ORGANISMS SEEN    Culture   Final    RARE STREPTOCOCCUS AGALACTIAE TESTING AGAINST S. AGALACTIAE NOT ROUTINELY PERFORMED DUE TO PREDICTABILITY OF AMP/PEN/VAN SUSCEPTIBILITY. RARE STAPHYLOCOCCUS AUREUS CRITICAL RESULT CALLED TO, READ BACK BY AND VERIFIED WITH: RN [place] ON [id number] AT 5701 BY E.PARRISH Performed at [facility], [place] [address], [place], [place] [address]    Report Status PENDING  Incomplete  Anaerobic culture w Gram Stain     Status: None (Preliminary result)   Collection Time: 03/04/21  9:44 PM   Specimen: PATH Bone biopsy; Other  Result Value Ref Range Status   Specimen Description   Final    BONE LEFT 5TH METARSAL Performed at [facility], [address], [place], [place] [address]    Special Requests   Final    NONE Performed at [facility], [address] [place], [place] [address]    Gram Stain   Final    NO WBC SEEN NO ORGANISMS SEEN Performed at [facility], [place] [address] [place], [place] [address]    Culture PENDING  Incomplete   Report Status PENDING  Incomplete      Radiology Studies: PERIPHERAL VASCULAR CATHETERIZATION  Result Date: 03/06/2021 See op note    Scheduled Meds: . calcium-vitamin D  2 tablet Oral Q breakfast  .  enoxaparin (LOVENOX) injection  0.5 mg/kg Subcutaneous Q24H  . fentaNYL      . gabapentin  300 mg Oral TID  . heparin sodium (porcine)      . insulin aspart  0-15 Units Subcutaneous TID PC & HS  . linagliptin  5 mg Oral Daily  . midazolam      . PARoxetine  40 mg Oral Daily  . potassium chloride  20 mEq Oral Daily  . pravastatin  40 mg Oral QHS  . sodium chloride flush  3 mL Intravenous Q12H   Continuous Infusions: . sodium chloride 75 mL/hr at 03/06/21 0739  . sodium chloride    . ceFEPime (MAXIPIME) IV 2 g (03/06/21 2149)  . vancomycin 750 mg (03/06/21 1948)     LOS: 4 days     Enzo Bi, MD [facility] If 7PM-7AM, please contact night-coverage 03/07/2021, 12:08 AM

## 2021-03-07 NOTE — Plan of Care (Signed)
  Problem: Education: Goal: Knowledge of General Education information will improve Description: Including pain rating scale, medication(s)/side effects and non-pharmacologic comfort measures 03/07/2021 1322 by Conard Novak, RN Outcome: Adequate for Discharge 03/07/2021 1322 by Conard Novak, RN Outcome: Adequate for Discharge   Problem: Health Behavior/Discharge Planning: Goal: Ability to manage health-related needs will improve 03/07/2021 1322 by Conard Novak, RN Outcome: Adequate for Discharge 03/07/2021 1322 by Conard Novak, RN Outcome: Adequate for Discharge   Problem: Clinical Measurements: Goal: Ability to maintain clinical measurements within normal limits will improve 03/07/2021 1322 by Conard Novak, RN Outcome: Adequate for Discharge 03/07/2021 1322 by Conard Novak, RN Outcome: Adequate for Discharge Goal: Will remain free from infection 03/07/2021 1322 by Conard Novak, RN Outcome: Adequate for Discharge 03/07/2021 1322 by Conard Novak, RN Outcome: Adequate for Discharge Goal: Diagnostic test results will improve 03/07/2021 1322 by Conard Novak, RN Outcome: Adequate for Discharge 03/07/2021 1322 by Conard Novak, RN Outcome: Adequate for Discharge Goal: Respiratory complications will improve 03/07/2021 1322 by Conard Novak, RN Outcome: Adequate for Discharge 03/07/2021 1322 by Conard Novak, RN Outcome: Adequate for Discharge Goal: Cardiovascular complication will be avoided 03/07/2021 1322 by Conard Novak, RN Outcome: Adequate for Discharge 03/07/2021 1322 by Conard Novak, RN Outcome: Adequate for Discharge   Problem: Activity: Goal: Risk for activity intolerance will decrease 03/07/2021 1322 by Conard Novak, RN Outcome: Adequate for Discharge 03/07/2021 1322 by Conard Novak, RN Outcome: Adequate for Discharge   Problem: Nutrition: Goal: Adequate nutrition will be maintained 03/07/2021 1322 by  Conard Novak, RN Outcome: Adequate for Discharge 03/07/2021 1322 by Conard Novak, RN Outcome: Adequate for Discharge   Problem: Coping: Goal: Level of anxiety will decrease 03/07/2021 1322 by Conard Novak, RN Outcome: Adequate for Discharge 03/07/2021 1322 by Conard Novak, RN Outcome: Adequate for Discharge   Problem: Elimination: Goal: Will not experience complications related to bowel motility 03/07/2021 1322 by Conard Novak, RN Outcome: Adequate for Discharge 03/07/2021 1322 by Conard Novak, RN Outcome: Adequate for Discharge Goal: Will not experience complications related to urinary retention 03/07/2021 1322 by Conard Novak, RN Outcome: Adequate for Discharge 03/07/2021 1322 by Conard Novak, RN Outcome: Adequate for Discharge   Problem: Pain Managment: Goal: General experience of comfort will improve 03/07/2021 1322 by Conard Novak, RN Outcome: Adequate for Discharge 03/07/2021 1322 by Conard Novak, RN Outcome: Adequate for Discharge   Problem: Safety: Goal: Ability to remain free from injury will improve 03/07/2021 1322 by Conard Novak, RN Outcome: Adequate for Discharge 03/07/2021 1322 by Conard Novak, RN Outcome: Adequate for Discharge   Problem: Skin Integrity: Goal: Risk for impaired skin integrity will decrease 03/07/2021 1322 by Conard Novak, RN Outcome: Adequate for Discharge 03/07/2021 1322 by Conard Novak, RN Outcome: Adequate for Discharge   Problem: Acute Rehab PT Goals(only PT should resolve) Goal: Patient Will Transfer Sit To/From Stand Outcome: Adequate for Discharge Goal: Pt Will Transfer Bed To Chair/Chair To Bed Outcome: Adequate for Discharge Goal: Pt Will Ambulate Outcome: Adequate for Discharge Goal: Pt Will Go Up/Down Stairs Outcome: Adequate for Discharge Goal: Pt/caregiver will Perform Home Exercise Program Outcome: Adequate for Discharge

## 2021-03-07 NOTE — Progress Notes (Signed)
1 Day Post-Op   Subjective/Chief Complaint: Patient seen.  No significant complaints of pain.   Objective: Vital signs in last 24 hours: Temp:  [97.6 F (36.4 C)-98.4 F (36.9 C)] 98.2 F (36.8 C) (04/30 0741) Pulse Rate:  [56-67] 57 (04/30 0741) Resp:  [12-18] 16 (04/30 0741) BP: (100-181)/(56-80) 119/58 (04/30 0741) SpO2:  [96 %-100 %] 96 % (04/30 0741) Weight:  [86.2 kg] 86.2 kg (04/29 1130) Last BM Date: 03/06/21  Intake/Output from previous day: 04/29 0701 - 04/30 0700 In: 797.3 [I.V.:447.3; IV Piggyback:350] Out: 400 [Urine:400] Intake/Output this shift: No intake/output data recorded.  Bandages dry and intact.  Upon removal there is some mild bleeding and drainage, no significant purulence and normal drainage for antibiotic beads which were placed.  Cellulitis and edema significantly improved.  Incision is well coapted with still some mild appearing incisional necrosis on the plantar flap but overall stable.    Lab Results:  Recent Labs    03/06/21 0727 03/07/21 0519  WBC 12.9* 10.1  HGB 10.1* 9.4*  HCT 31.1* 29.9*  PLT 251 251   BMET Recent Labs    03/06/21 0727 03/07/21 0519  NA 140 141  K 3.9 4.4  CL 111 113*  CO2 23 23  GLUCOSE 132* 87  BUN 21 21  CREATININE 1.00 1.11*  CALCIUM 8.3* 8.2*   PT/INR No results for input(s): LABPROT, INR in the last 72 hours. ABG No results for input(s): PHART, HCO3 in the last 72 hours.  Invalid input(s): PCO2, PO2  Studies/Results: PERIPHERAL VASCULAR CATHETERIZATION  Result Date: 03/06/2021 See op note   Anti-infectives: Anti-infectives (From admission, onward)   Start     Dose/Rate Route Frequency Ordered Stop   03/07/21 2000  vancomycin (VANCOCIN) 750 mg in sodium chloride 0.9 % 250 mL IVPB        750 mg 250 mL/hr over 60 Minutes Intravenous Every 24 hours 03/07/21 0822     03/06/21 1200  ceFAZolin (ANCEF) 1 g in sodium chloride 0.9 % 100 mL IVPB  Status:  Discontinued       Note to Pharmacy: To be  given in specials   1 g 200 mL/hr over 30 Minutes Intravenous  Once 03/06/21 1113 03/06/21 1401   03/06/21 1153  sodium chloride 0.9 % with ceFAZolin (ANCEF) ADS Med  Status:  Discontinued       Note to Pharmacy: Delametter, Gretchen: cabinet override      03/06/21 1153 03/06/21 1340   03/05/21 2000  vancomycin (VANCOREADY) IVPB 1250 mg/250 mL  Status:  Discontinued        1,250 mg 166.7 mL/hr over 90 Minutes Intravenous Every 48 hours 03/03/21 2211 03/05/21 1002   03/05/21 2000  vancomycin (VANCOREADY) IVPB 750 mg/150 mL  Status:  Discontinued        750 mg 150 mL/hr over 60 Minutes Intravenous Every 24 hours 03/05/21 1002 03/07/21 0822   03/04/21 2147  vancomycin (VANCOCIN) powder  Status:  Discontinued          As needed 03/04/21 2147 03/04/21 2214   03/04/21 1000  ceFEPIme (MAXIPIME) 2 g in sodium chloride 0.9 % 100 mL IVPB        2 g 200 mL/hr over 30 Minutes Intravenous Every 12 hours 03/03/21 2132     03/03/21 2130  vancomycin (VANCOCIN) IVPB 1000 mg/200 mL premix  Status:  Discontinued        1,000 mg 200 mL/hr over 60 Minutes Intravenous  Once 03/03/21 2123 03/03/21 2140  03/03/21 2130  ceFEPIme (MAXIPIME) 2 g in sodium chloride 0.9 % 100 mL IVPB  Status:  Discontinued        2 g 200 mL/hr over 30 Minutes Intravenous  Once 03/03/21 2123 03/03/21 2140   03/03/21 2000  ceFEPIme (MAXIPIME) 2 g in sodium chloride 0.9 % 100 mL IVPB        2 g 200 mL/hr over 30 Minutes Intravenous  Once 03/03/21 1957 03/03/21 2105   03/03/21 1930  vancomycin (VANCOREADY) IVPB 2000 mg/400 mL        2,000 mg 200 mL/hr over 120 Minutes Intravenous  Once 03/03/21 1912 03/03/21 2234      Assessment/Plan: s/p Procedure(s): Lower Extremity Angiography (Left) Assessment: Stable status post fifth ray resection.   Plan: Betadine and a sterile bandage reapplied to the left foot.  Patient should be stable from a podiatry standpoint on oral antibiotics.  Would recommend most likely Augmentin and Cipro  combination.  Would recommend home health for dressing change a couple of times a week for the first week or so and I will follow the patient up in the office next week.  If still in the hospital I will change her bandage on Monday.  LOS: 4 days    Durward Fortes 03/07/2021

## 2021-03-07 NOTE — Plan of Care (Signed)
Patient had an uneventful shift. No changes in neurological and neurovascular assessments. No complaints of pain. Denies any needs at this time. All Safety measures maintained. Care continues.   Problem: Education: Goal: Knowledge of General Education information will improve Description: Including pain rating scale, medication(s)/side effects and non-pharmacologic comfort measures Outcome: Progressing   Problem: Health Behavior/Discharge Planning: Goal: Ability to manage health-related needs will improve Outcome: Progressing   Problem: Clinical Measurements: Goal: Ability to maintain clinical measurements within normal limits will improve Outcome: Progressing Goal: Will remain free from infection Outcome: Progressing Goal: Diagnostic test results will improve Outcome: Progressing Goal: Respiratory complications will improve Outcome: Progressing Goal: Cardiovascular complication will be avoided Outcome: Progressing   Problem: Activity: Goal: Risk for activity intolerance will decrease Outcome: Progressing   Problem: Nutrition: Goal: Adequate nutrition will be maintained Outcome: Progressing  Problem: Coping: Goal: Level of anxiety will decrease Outcome: Progressing   Problem: Elimination: Goal: Will not experience complications related to bowel motility Outcome: Progressing Goal: Will not experience complications related to urinary retention Outcome: Progressing   Problem: Pain Managment: Goal: General experience of comfort will improve Outcome: Progressing   Problem: Safety: Goal: Ability to remain free from injury will improve Outcome: Progressing   Problem: Skin Integrity: Goal: Risk for impaired skin integrity will decrease Outcome: Progressing   

## 2021-03-07 NOTE — Discharge Summary (Signed)
Physician Discharge Summary  Ardine EngCarolyn L Chong ZOX:[id number] DOB: May 09, 1951 DOA: 03/03/2021  PCP: Leanna SatoMiles, Linda M, MD  Admit date: 03/03/2021 Discharge date: 03/07/2021  Admitted From: home Disposition:  home  Recommendations for Outpatient Follow-up:  1. Follow up with PCP in 1-2 weeks 2. Please obtain BMP/CBC in one week 3. Please follow up with podiatry, Dr. Alberteen Spindleline, this coming week  Home Health: PT, RN  Equipment/Devices: rolling walker, 3-n-1  Discharge Condition: stable  CODE STATUS: full  Diet recommendation: Carb Modified    Discharge Diagnoses: Active Problems:   Cellulitis in diabetic foot [facility](HCC)    Summary of HPI and Hospital Course:   Marcha DuttonCarolyn L Oakleyis a 70 y.o.Caucasian femalewith medical history significant fortype diabetes mellitus, hypertension, lymphedema, CVA and psoriasis, who presented to the emergency room with acute onset of worsening left foot erythema, swelling, pain and tenderness for the last couple of days. She denies any fever or chills.  Admitted to the hospital for evaluation and management of cellulitis with underlying osteomyelitis.  Podiatry was consulted and patient underwent Left 5th ray amputation on 03/06/21.  Vascular surgery was consulted and took patient for Left lower extremity angiogram showed no significant stenosis.    4/30 - Day of discharge: Case discussed with podiatrist, Dr. Alberteen Spindleline.  He will see her in clinic this coming week, and weekly moving forward to monitor healing closely.    Home health RN was arranged for dressing changes twice weekly at home (first couple of weeks the 2nd dressing change will be done in podiatry clinic).  Home health PT was set up as recommended to continue improving functional mobility     Full-thickness ulceration left foot with cellulitis. Osteomyelitis left fifth ray S/p amputation of the 5th toe with ray resection Treated with empiric IV vancomycin and cefepime.  Discharged on Augmentin and  Cipro, per podiatry Partial weightbearing with assistance in a surgical shoe with pressure only on the heel. Home health RN for dressing changes twice weekly. Podiatry did dressing change today. Follow up with Dr. Alberteen Spindleline next week.  Acute kidney injury superimposed on stage IIIa chronic kidney disease - treated with IV fluids --hold home Lisinopril  Essential hypertension -intermittently low BP and HR --Hold home Coreg and lisinopril at discharge --Monitor BP at home and record readings --Close PCP follow up  Type 2 diabetes mellitus -Hold home metformin --cont Tradjenta --sliding scale Novolog coverage Resume prior home regimen at d/c.    Diabetic peripheral neuropathy  --cont gabapentin  Hypokalemia and Hypomagnesemia - replaced. --monitor and replete PRN  Dyslipidemia -cont statin  History of breast cancer  -home letrozole has been held  Depression  -cont Paxil    Discharge Instructions   Discharge Instructions    Call MD for:  extreme fatigue   Complete by: As directed    Call MD for:  extreme fatigue   Complete by: As directed    Call MD for:  persistant dizziness or light-headedness   Complete by: As directed    Call MD for:  persistant dizziness or light-headedness   Complete by: As directed    Call MD for:  persistant nausea and vomiting   Complete by: As directed    Call MD for:  persistant nausea and vomiting   Complete by: As directed    Call MD for:  redness, tenderness, or signs of infection (pain, swelling, redness, odor or green/yellow discharge around incision site)   Complete by: As directed    Call MD for:  severe  uncontrolled pain   Complete by: As directed    Call MD for:  severe uncontrolled pain   Complete by: As directed    Call MD for:  temperature >100.4   Complete by: As directed    Call MD for:  temperature >100.4   Complete by: As directed    Change dressing (specify)   Complete by: As directed    Dressing change: two  times per week by either [facility] RN or podiatrist at clinic.   Discharge instructions   Complete by: As directed    Take both antibiotics (Augmentin and Cipro) twice daily until gone, or until Dr. Cleda Mccreedy tells you okay to stop.    Dr. Cleda Mccreedy will see you in clinic this week and weekly after that to follow up on your foot healing.    Home health nurse will come out to monitor the dressing and wound healing.    Home health physical therapy will come to work on improving your mobility and strength.   Discharge instructions   Complete by: As directed    Take both antibiotics as prescribed until complete.  Dr. Cleda Mccreedy will see you in clinic this coming week and do a dressing change.  Home health nurse can do other dressing changes.  They need to be done twice per week.   Increase activity slowly   Complete by: As directed    Increase activity slowly   Complete by: As directed    Leave dressing on - Keep it clean, dry, and intact until clinic visit   Complete by: As directed    Leave dressing on - Keep it clean, dry, and intact until clinic visit   Complete by: As directed      Allergies as of 03/07/2021   No Known Allergies     Medication List    STOP taking these medications   carvedilol 12.5 MG tablet Commonly known as: COREG   lisinopril 20 MG tablet Commonly known as: ZESTRIL     TAKE these medications   acetaminophen 325 MG tablet Commonly known as: TYLENOL Take 2 tablets (650 mg total) by mouth every 6 (six) hours as needed for mild pain (or Fever >/= 101).   albuterol 108 (90 Base) MCG/ACT inhaler Commonly known as: VENTOLIN HFA Inhale 2 puffs into the lungs every 6 (six) hours as needed for wheezing or shortness of breath.   amoxicillin-clavulanate 875-125 MG tablet Commonly known as: Augmentin Take 1 tablet by mouth every 12 (twelve) hours for 10 days.   calcium-vitamin D 500-200 MG-UNIT tablet Commonly known as: OSCAL WITH D Take 2 tablets by mouth daily with  breakfast.   ciprofloxacin 500 MG tablet Commonly known as: CIPRO Take 1 tablet (500 mg total) by mouth 2 (two) times daily for 14 days.   clopidogrel 75 MG tablet Commonly known as: PLAVIX Take 75 mg by mouth.   furosemide 20 MG tablet Commonly known as: LASIX Take 20 mg by mouth daily.   gabapentin 300 MG capsule Commonly known as: NEURONTIN Take 300 mg by mouth 3 (three) times daily.   HYDROcodone-acetaminophen 5-325 MG tablet Commonly known as: NORCO/VICODIN Take 1-2 tablets by mouth every 4 (four) hours as needed for up to 7 days for moderate pain.   letrozole 2.5 MG tablet Commonly known as: FEMARA Take 1 tablet (2.5 mg total) by mouth daily.   linagliptin 5 MG Tabs tablet Commonly known as: TRADJENTA Take 5 mg by mouth daily.   metFORMIN 1000 MG tablet Commonly  known as: GLUCOPHAGE Take 1,000 mg by mouth daily with breakfast.   ondansetron 4 MG tablet Commonly known as: ZOFRAN Take 1 tablet (4 mg total) by mouth every 6 (six) hours as needed for nausea.   PARoxetine 40 MG tablet Commonly known as: PAXIL Take 40 mg by mouth daily.   potassium chloride 10 MEQ CR capsule Commonly known as: MICRO-K Take 10 mEq by mouth daily.   pravastatin 40 MG tablet Commonly known as: PRAVACHOL Take 40 mg by mouth daily. In afternoon   True Metrix Blood Glucose Test test strip Generic drug: glucose blood   TRUEplus Lancets 33G Misc            Durable Medical Equipment  (From admission, onward)         Start     Ordered   03/07/21 1421  For home use only DME Walker rolling  Once       Question Answer Comment  Walker: With 5 Inch Wheels   Patient needs a walker to treat with the following condition Acute osteomyelitis of left foot ([place])      03/07/21 1420   03/07/21 1421  For home use only DME 3 n 1  Once        03/07/21 1420           Discharge Care Instructions  (From admission, onward)         Start     Ordered   03/07/21 0000  Leave dressing  on - Keep it clean, dry, and intact until clinic visit        03/07/21 1256   03/07/21 0000  Leave dressing on - Keep it clean, dry, and intact until clinic visit        03/07/21 1256   03/07/21 0000  Change dressing (specify)       Comments: Dressing change: two times per week by either [facility] RN or podiatrist at clinic.   03/07/21 1256          Follow-up Information    Schnier, Dolores Lory, MD Follow up in 1 month(s).   Specialties: Vascular Surgery, Cardiology, Radiology, Vascular Surgery Why: Can see Schnier or Arna Medici. Will need ABI with visit.  Contact information: [place] [address] [phone]              No Known Allergies   If you experience worsening of your admission symptoms, develop shortness of breath, life threatening emergency, suicidal or homicidal thoughts you must seek medical attention immediately by calling 911 or calling your MD immediately  if symptoms less severe.    Please note   You were cared for by a hospitalist during your hospital stay. If you have any questions about your discharge medications or the care you received while you were in the hospital after you are discharged, you can call the unit and asked to speak with the hospitalist on call if the hospitalist that took care of you is not available. Once you are discharged, your primary care physician will handle any further medical issues. Please note that NO REFILLS for any discharge medications will be authorized once you are discharged, as it is imperative that you return to your primary care physician (or establish a relationship with a primary care physician if you do not have one) for your aftercare needs so that they can reassess your need for medications and monitor your lab values.   Consultations:  Podiatry  Vascular surgery   Procedures/Studies: MR  FOOT LEFT WO CONTRAST  Result Date: 03/04/2021 CLINICAL DATA:  Diabetic foot ulcer EXAM: MRI OF THE LEFT FOOT  WITHOUT CONTRAST TECHNIQUE: Multiplanar, multisequence MR imaging of the left forefoot was performed. No intravenous contrast was administered. COMPARISON:  X-ray 03/03/2021 FINDINGS: Bones/Joint/Cartilage Bone marrow edema within the fifth metatarsal head and base of the fifth toe proximal phalanx (series 8, images 13-17) with subtle intermediate T1 marrow signal relative to the adjacent bony structures. No erosion. Trace fifth MTP joint effusion. Remaining osseous structures are intact. No fracture or dislocation. Mild degenerative changes within the left forefoot including the first MTP joint and tarsometatarsal joints. Ligaments Intact Lisfranc ligament. Collateral ligaments of the forefoot appear intact. Muscles and Tendons Denervation changes of the intrinsic foot musculature. Mild diffuse edema-like signal throughout the musculature which could reflect a component of myositis. Intact flexor and extensor tendons. No tenosynovial fluid collection. Soft tissues Soft tissue swelling and edema centered at the level of the fifth MTP joint. There are a few scattered areas of susceptibility artifact within the soft tissues corresponding to air seen on previous radiographs (series 3, images 27 and 29). No organized or drainable fluid collection. Prominent subcutaneous edema overlies the dorsum of the foot. IMPRESSION: 1. Bone marrow edema within the fifth metatarsal head and base of the fifth toe proximal phalanx with subtle T1 marrow signal changes. Findings are favored to represent early acute osteomyelitis. 2. Findings of cellulitis centered at the level of the fifth MTP joint. There are a few scattered areas of susceptibility artifact within the soft tissues corresponding to air seen on previous radiographs. No organized or drainable fluid collection. 3. Mild diffuse edema-like signal throughout the musculature which may reflect denervation changes and/or myositis. Electronically Signed   By: Davina Poke D.O.    On: 03/04/2021 08:26   PERIPHERAL VASCULAR CATHETERIZATION  Result Date: 03/06/2021 See op note  DG Chest Port 1 View  Result Date: 03/03/2021 CLINICAL DATA:  70 year old female with concern for sepsis. EXAM: PORTABLE CHEST 1 VIEW COMPARISON:  Chest radiograph dated 06/26/2019. FINDINGS: The lungs are clear. There is no pleural effusion pneumothorax. The cardiac silhouette is within limits. No acute osseous pathology. IMPRESSION: No active disease. Electronically Signed   By: Anner Crete M.D.   On: 03/03/2021 19:41   DG Foot Complete Left  Result Date: 03/03/2021 CLINICAL DATA:  Questionable sepsis. Left foot cellulitis. Open wound. EXAM: LEFT FOOT - COMPLETE 3+ VIEW COMPARISON:  None. FINDINGS: Soft tissue thickening with soft tissue air adjacent to the fifth metatarsal phalangeal joint. No radiopaque foreign body. No definite subjacent bony destructive change or erosion. Hammertoe deformity of the digits. Mild pes planus and degenerative change in the midfoot. Plantar calcaneal spur and fragmented Achilles tendon enthesophyte. Advanced vascular calcifications. Soft tissue edema overlies the dorsum of the foot. IMPRESSION: 1. Soft tissue thickening with soft tissue air adjacent to the fifth metatarsophalangeal joint suspicious for soft tissue infection. Recommend correlation for site of ulcer. No radiographic findings of osteomyelitis. 2. Advanced vascular calcifications. 3. Soft tissue edema overlies the dorsum of the foot. Electronically Signed   By: Keith Rake M.D.   On: 03/03/2021 19:43     Subjective: Pt feeling well. No fever or chills.  Pain controlled. Wants to go home.    Discharge Exam: Vitals:   03/07/21 0741 03/07/21 1538  BP: (!) 119/58 123/62  Pulse: (!) 57 62  Resp: 16 18  Temp: 98.2 F (36.8 C) 97.7 F (36.5 C)  SpO2: 96% 99%  Vitals:   03/06/21 1940 03/07/21 0455 03/07/21 0741 03/07/21 1538  BP: 139/72 (!) 111/57 (!) 119/58 123/62  Pulse: (!) 58 60 (!)  57 62  Resp: 17 17 16 18   Temp: 97.6 F (36.4 C) 97.8 F (36.6 C) 98.2 F (36.8 C) 97.7 F (36.5 C)  TempSrc: Oral Oral    SpO2: 97% 97% 96% 99%  Weight:      Height:        General: Pt is alert, awake, not in acute distress Cardiovascular: RRR, S1/S2 +, no rubs, no gallops Respiratory: CTA bilaterally, no wheezing, no rhonchi Abdominal: Soft, NT, ND, bowel sounds + Extremities: LLE in offloading boot, ace wrap intact, no edema, no cyanosis    The results of significant diagnostics from this hospitalization (including imaging, microbiology, ancillary and laboratory) are listed below for reference.     Microbiology: Recent Results (from the past 240 hour(s))  Blood culture (routine x 2)     Status: None (Preliminary result)   Collection Time: 03/03/21  7:17 PM   Specimen: BLOOD  Result Value Ref Range Status   Specimen Description BLOOD RIGHT ANTECUBITAL  Final   Special Requests   Final    BOTTLES DRAWN AEROBIC AND ANAEROBIC Blood Culture adequate volume   Culture   Final    NO GROWTH 4 DAYS Performed at [facility], [address], [place], [place] [address]    Report Status PENDING  Incomplete  Blood culture (routine x 2)     Status: None (Preliminary result)   Collection Time: 03/03/21  7:17 PM   Specimen: BLOOD  Result Value Ref Range Status   Specimen Description BLOOD BLOOD RIGHT ARM  Final   Special Requests   Final    BOTTLES DRAWN AEROBIC AND ANAEROBIC Blood Culture adequate volume   Culture   Final    NO GROWTH 4 DAYS Performed at [facility], [address], [place], [place] [address]    Report Status PENDING  Incomplete  Resp Panel by RT-PCR (Flu A&B, Covid) Nasopharyngeal Swab     Status: None   Collection Time: 03/03/21  7:17 PM   Specimen: Nasopharyngeal Swab; Nasopharyngeal(NP) swabs in vial transport medium  Result Value Ref Range Status   SARS Coronavirus 2 by RT PCR NEGATIVE NEGATIVE Final    Comment: (NOTE) SARS-CoV-2  target nucleic acids are NOT DETECTED.  The SARS-CoV-2 RNA is generally detectable in upper respiratory specimens during the acute phase of infection. The lowest concentration of SARS-CoV-2 viral copies this assay can detect is 138 copies/mL. A negative result does not preclude SARS-Cov-2 infection and should not be used as the sole basis for treatment or other patient management decisions. A negative result may occur with  improper specimen collection/handling, submission of specimen other than nasopharyngeal swab, presence of viral mutation(s) within the areas targeted by this assay, and inadequate number of viral copies(<138 copies/mL). A negative result must be combined with clinical observations, patient history, and epidemiological information. The expected result is Negative.  Fact Sheet for Patients:  EntrepreneurPulse.com.au  Fact Sheet for Healthcare Providers:  IncredibleEmployment.be  This test is no t yet approved or cleared by the [place] FDA and  has been authorized for detection and/or diagnosis of SARS-CoV-2 by FDA under an Emergency Use Authorization (EUA). This EUA will remain  in effect (meaning this test can be used) for the duration of the COVID-19 declaration under Section 564(b)(1) of the Act, 21 U.S.C.section 360bbb-3(b)(1), unless the authorization is terminated  or revoked sooner.       Influenza A by PCR NEGATIVE NEGATIVE Final   Influenza B by PCR NEGATIVE NEGATIVE Final    Comment: (NOTE) The Xpert Xpress SARS-CoV-2/FLU/RSV plus assay is intended as an aid in the diagnosis of influenza from Nasopharyngeal swab specimens and should not be used as a sole basis for treatment. Nasal washings and aspirates are unacceptable for Xpert Xpress SARS-CoV-2/FLU/RSV testing.  Fact Sheet for Patients: EntrepreneurPulse.com.au  Fact Sheet for Healthcare  Providers: IncredibleEmployment.be  This test is not yet approved or cleared by the [place] FDA and has been authorized for detection and/or diagnosis of SARS-CoV-2 by FDA under an Emergency Use Authorization (EUA). This EUA will remain in effect (meaning this test can be used) for the duration of the COVID-19 declaration under Section 564(b)(1) of the Act, 21 U.S.C. section 360bbb-3(b)(1), unless the authorization is terminated or revoked.  Performed at [facility], [address], [place], [place] [address]   Urine culture     Status: Abnormal   Collection Time: 03/03/21  9:10 PM   Specimen: Urine, Random  Result Value Ref Range Status   Specimen Description   Final    URINE, RANDOM Performed at [facility], [address] [place], [place] [address]    Special Requests   Final    NONE Performed at [facility], [address] [place], [place] [address]    Culture MULTIPLE SPECIES PRESENT, SUGGEST RECOLLECTION (A)  Final   Report Status 03/05/2021 FINAL  Final  Aerobic/Anaerobic Culture w Gram Stain (surgical/deep wound)     Status: None (Preliminary result)   Collection Time: 03/04/21  9:38 PM   Specimen: PATH Other; Wound  Result Value Ref Range Status   Specimen Description   Final    WOUND LEFT FOOT Performed at [facility], [address] [place], [place] [address]    Special Requests   Final    NONE Performed at [facility], [place], [place] [address]    Gram Stain   Final    NO WBC SEEN ABUNDANT GRAM POSITIVE COCCI MODERATE GRAM NEGATIVE RODS FEW GRAM POSITIVE RODS Performed at [facility], [place] [address], [place], [place] [address]    Culture   Final    ABUNDANT STAPHYLOCOCCUS AUREUS RARE ESCHERICHIA COLI SUSCEPTIBILITIES TO FOLLOW ABUNDANT STREPTOCOCCUS AGALACTIAE TESTING AGAINST S. AGALACTIAE NOT ROUTINELY PERFORMED DUE TO PREDICTABILITY OF AMP/PEN/VAN  SUSCEPTIBILITY. NO ANAEROBES ISOLATED; CULTURE IN PROGRESS FOR 5 DAYS    Report Status PENDING  Incomplete  Aerobic Culture w Gram Stain (superficial specimen)     Status: None (Preliminary result)   Collection Time: 03/04/21  9:44 PM   Specimen: PATH Bone biopsy; Other  Result Value Ref Range Status   Specimen Description   Final    BONE LEFT 5TH METATARSAL Performed at [facility], [address] [place], [place] [address]    Special Requests   Final    NONE Performed at [facility], [address] [place], [place] [address]    Gram Stain   Final    RARE WBC PRESENT, PREDOMINANTLY PMN NO ORGANISMS SEEN    Culture   Final    RARE STREPTOCOCCUS AGALACTIAE TESTING AGAINST S. AGALACTIAE NOT ROUTINELY PERFORMED DUE TO PREDICTABILITY OF AMP/PEN/VAN SUSCEPTIBILITY. RARE STAPHYLOCOCCUS AUREUS CRITICAL RESULT CALLED TO, READ BACK BY AND VERIFIED WITH: RN [place] ON LR:[id number] AT [id number] BY E.PARRISH Performed at [facility],  [address], [place], [place] [address]    Report Status PENDING  Incomplete  Anaerobic culture w Gram Stain     Status: None (Preliminary result)   Collection Time: 03/04/21  9:44 PM   Specimen: PATH Bone biopsy; Other  Result Value Ref Range Status   Specimen Description   Final    BONE LEFT 5TH METARSAL Performed at [facility], [address], [place], [place] [address]    Special Requests   Final    NONE Performed at [facility], [address] [place], [place] [address]    Gram Stain   Final    NO WBC SEEN NO ORGANISMS SEEN Performed at [facility], [place] [address], [place], [place] [address]    Culture PENDING  Incomplete   Report Status PENDING  Incomplete     Labs: BNP (last 3 results) No results for input(s): BNP in the last 8760 hours. Basic Metabolic Panel: Recent Labs  Lab 03/03/21 1917 03/04/21 0504 03/05/21 0549 03/06/21 0727 03/07/21 0519  NA 134* 137 140 140 141  K  3.4* 3.4* 4.4 3.9 4.4  CL 100 104 110 111 113*  CO2 22 24 21* 23 23  GLUCOSE 145* 124* 184* 132* 87  BUN 27* 25* 20 21 21   CREATININE 1.59* 1.44* 1.24* 1.00 1.11*  CALCIUM 8.5* 8.3* 8.5* 8.3* 8.2*  MG 1.2*  --  2.4 2.0 1.8   Liver Function Tests: Recent Labs  Lab 03/03/21 1917  AST 14*  ALT 10  ALKPHOS 73  BILITOT 0.9  PROT 6.7  ALBUMIN 3.2*   No results for input(s): LIPASE, AMYLASE in the last 168 hours. No results for input(s): AMMONIA in the last 168 hours. CBC: Recent Labs  Lab 03/03/21 1917 03/04/21 0504 03/05/21 0549 03/06/21 0727 03/07/21 0519  WBC 19.3* 15.0* 13.2* 12.9* 10.1  NEUTROABS 16.8*  --   --   --   --   HGB 10.8* 9.7* 10.6* 10.1* 9.4*  HCT 33.7* 29.6* 33.0* 31.1* 29.9*  MCV 85.1 84.6 85.9 85.2 86.9  PLT 202 174 208 251 251   Cardiac Enzymes: No results for input(s): CKTOTAL, CKMB, CKMBINDEX, TROPONINI in the last 168 hours. BNP: Invalid input(s): POCBNP CBG: Recent Labs  Lab 03/06/21 0746 03/06/21 1138 03/06/21 1349 03/06/21 2055 03/07/21 0745  GLUCAP 132* 96 101* 270* 89   D-Dimer No results for input(s): DDIMER in the last 72 hours. Hgb A1c No results for input(s): HGBA1C in the last 72 hours. Lipid Profile No results for input(s): CHOL, HDL, LDLCALC, TRIG, CHOLHDL, LDLDIRECT in the last 72 hours. Thyroid function studies No results for input(s): TSH, T4TOTAL, T3FREE, THYROIDAB in the last 72 hours.  Invalid input(s): FREET3 Anemia work up No results for input(s): VITAMINB12, FOLATE, FERRITIN, TIBC, IRON, RETICCTPCT in the last 72 hours. Urinalysis    Component Value Date/Time   COLORURINE YELLOW (A) 03/03/2021 2110   APPEARANCEUR HAZY (A) 03/03/2021 2110   APPEARANCEUR Cloudy 01/05/2012 1249   LABSPEC 1.009 03/03/2021 2110   LABSPEC 1.027 01/05/2012 1249   PHURINE 5.0 03/03/2021 2110   GLUCOSEU NEGATIVE 03/03/2021 2110   GLUCOSEU 150 mg/dL 01/05/2012 1249   HGBUR SMALL (A) 03/03/2021 2110   BILIRUBINUR NEGATIVE  03/03/2021 2110   BILIRUBINUR Negative 01/05/2012 [place] 03/03/2021 2110   PROTEINUR NEGATIVE 03/03/2021 2110   NITRITE NEGATIVE 03/03/2021 2110   LEUKOCYTESUR LARGE (A) 03/03/2021 2110   LEUKOCYTESUR Negative 01/05/2012 1249   Sepsis Labs Invalid  input(s): PROCALCITONIN,  WBC,  LACTICIDVEN Microbiology Recent Results (from the past 240 hour(s))  Blood culture (routine x 2)     Status: None (Preliminary result)   Collection Time: 03/03/21  7:17 PM   Specimen: BLOOD  Result Value Ref Range Status   Specimen Description BLOOD RIGHT ANTECUBITAL  Final   Special Requests   Final    BOTTLES DRAWN AEROBIC AND ANAEROBIC Blood Culture adequate volume   Culture   Final    NO GROWTH 4 DAYS Performed at [facility], [address] [place], [place] [address]    Report Status PENDING  Incomplete  Blood culture (routine x 2)     Status: None (Preliminary result)   Collection Time: 03/03/21  7:17 PM   Specimen: BLOOD  Result Value Ref Range Status   Specimen Description BLOOD BLOOD RIGHT ARM  Final   Special Requests   Final    BOTTLES DRAWN AEROBIC AND ANAEROBIC Blood Culture adequate volume   Culture   Final    NO GROWTH 4 DAYS Performed at [facility], [address] [place], [place] [address]    Report Status PENDING  Incomplete  Resp Panel by RT-PCR (Flu A&B, Covid) Nasopharyngeal Swab     Status: None   Collection Time: 03/03/21  7:17 PM   Specimen: Nasopharyngeal Swab; Nasopharyngeal(NP) swabs in vial transport medium  Result Value Ref Range Status   SARS Coronavirus 2 by RT PCR NEGATIVE NEGATIVE Final    Comment: (NOTE) SARS-CoV-2 target nucleic acids are NOT DETECTED.  The SARS-CoV-2 RNA is generally detectable in upper respiratory specimens during the acute phase of infection. The lowest concentration of SARS-CoV-2 viral copies this assay can detect is 138 copies/mL. A negative result does not preclude SARS-Cov-2 infection and  should not be used as the sole basis for treatment or other patient management decisions. A negative result may occur with  improper specimen collection/handling, submission of specimen other than nasopharyngeal swab, presence of viral mutation(s) within the areas targeted by this assay, and inadequate number of viral copies(<138 copies/mL). A negative result must be combined with clinical observations, patient history, and epidemiological information. The expected result is Negative.  Fact Sheet for Patients:  EntrepreneurPulse.com.au  Fact Sheet for Healthcare Providers:  IncredibleEmployment.be  This test is no t yet approved or cleared by the [place] FDA and  has been authorized for detection and/or diagnosis of SARS-CoV-2 by FDA under an Emergency Use Authorization (EUA). This EUA will remain  in effect (meaning this test can be used) for the duration of the COVID-19 declaration under Section 564(b)(1) of the Act, 21 U.S.C.section 360bbb-3(b)(1), unless the authorization is terminated  or revoked sooner.       Influenza A by PCR NEGATIVE NEGATIVE Final   Influenza B by PCR NEGATIVE NEGATIVE Final    Comment: (NOTE) The Xpert Xpress SARS-CoV-2/FLU/RSV plus assay is intended as an aid in the diagnosis of influenza from Nasopharyngeal swab specimens and should not be used as a sole basis for treatment. Nasal washings and aspirates are unacceptable for Xpert Xpress SARS-CoV-2/FLU/RSV testing.  Fact Sheet for Patients: EntrepreneurPulse.com.au  Fact Sheet for Healthcare Providers: IncredibleEmployment.be  This test is not yet approved or cleared by the [place] FDA and has been authorized for detection and/or diagnosis of SARS-CoV-2 by FDA under an Emergency Use Authorization (EUA). This EUA will remain in effect (meaning this test can be used) for the duration of the COVID-19 declaration  under Section 564(b)(1) of  the Act, 21 U.S.C. section 360bbb-3(b)(1), unless the authorization is terminated or revoked.  Performed at [facility], [address] [place], [place] [address]   Urine culture     Status: Abnormal   Collection Time: 03/03/21  9:10 PM   Specimen: Urine, Random  Result Value Ref Range Status   Specimen Description   Final    URINE, RANDOM Performed at [facility], [address] [place] [address] [place], [place] [address]    Special Requests   Final    NONE Performed at [facility], [address] [place], [place] [address]    Culture MULTIPLE SPECIES PRESENT, SUGGEST RECOLLECTION (A)  Final   Report Status 03/05/2021 FINAL  Final  Aerobic/Anaerobic Culture w Gram Stain (surgical/deep wound)     Status: None (Preliminary result)   Collection Time: 03/04/21  9:38 PM   Specimen: PATH Other; Wound  Result Value Ref Range Status   Specimen Description   Final    WOUND LEFT FOOT Performed at [facility], [address] [place], [place] [address]    Special Requests   Final    NONE Performed at [facility], [place], [place] [address]    Gram Stain   Final    NO WBC SEEN ABUNDANT GRAM POSITIVE COCCI MODERATE GRAM NEGATIVE RODS FEW GRAM POSITIVE RODS Performed at [facility], [place] [address] [place], [place] [address]    Culture   Final    ABUNDANT STAPHYLOCOCCUS AUREUS RARE ESCHERICHIA COLI SUSCEPTIBILITIES TO FOLLOW ABUNDANT STREPTOCOCCUS AGALACTIAE TESTING AGAINST S. AGALACTIAE NOT ROUTINELY PERFORMED DUE TO PREDICTABILITY OF AMP/PEN/VAN SUSCEPTIBILITY. NO ANAEROBES ISOLATED; CULTURE IN PROGRESS FOR 5 DAYS    Report Status PENDING  Incomplete  Aerobic Culture w Gram Stain (superficial specimen)     Status: None (Preliminary result)   Collection Time: 03/04/21  9:44 PM   Specimen: PATH Bone biopsy; Other  Result Value Ref Range Status   Specimen Description   Final    BONE LEFT  5TH METATARSAL Performed at [facility], [address] [place], [place] [address]    Special Requests   Final    NONE Performed at [facility], [address] [place], [place] [address]    Gram Stain   Final    RARE WBC PRESENT, PREDOMINANTLY PMN NO ORGANISMS SEEN    Culture   Final    RARE STREPTOCOCCUS AGALACTIAE TESTING AGAINST S. AGALACTIAE NOT ROUTINELY PERFORMED DUE TO PREDICTABILITY OF AMP/PEN/VAN SUSCEPTIBILITY. RARE STAPHYLOCOCCUS AUREUS CRITICAL RESULT CALLED TO, READ BACK BY AND VERIFIED WITH: RN [place] ON [id number] AT [id number] BY E.PARRISH Performed at [facility], [place] [address] [place], [place] [address]    Report Status PENDING  Incomplete  Anaerobic culture w Gram Stain     Status: None (Preliminary result)   Collection Time: 03/04/21  9:44 PM   Specimen: PATH Bone biopsy; Other  Result Value Ref Range Status   Specimen Description   Final    BONE LEFT 5TH METARSAL Performed at [facility], [address], [place], [place] [address]    Special Requests   Final    NONE Performed at [facility], [address] [place], [place] [address]    Gram Stain   Final    NO WBC SEEN NO ORGANISMS SEEN Performed at [facility], [place] [address] [place], [place] [address]    Culture PENDING  Incomplete   Report Status  PENDING  Incomplete     Time coordinating discharge: Over 30 minutes  SIGNED:   Ezekiel Slocumb, DO [facility] 03/07/2021, 3:47 PM   If 7PM-7AM, please contact night-coverage [email or web address]

## 2021-03-08 LAB — CULTURE, BLOOD (ROUTINE X 2)
Culture: NO GROWTH
Culture: NO GROWTH
Special Requests: ADEQUATE
Special Requests: ADEQUATE

## 2021-03-08 LAB — AEROBIC CULTURE W GRAM STAIN (SUPERFICIAL SPECIMEN)

## 2021-03-10 LAB — AEROBIC/ANAEROBIC CULTURE W GRAM STAIN (SURGICAL/DEEP WOUND): Gram Stain: NONE SEEN

## 2021-03-11 LAB — ANAEROBIC CULTURE W GRAM STAIN: Gram Stain: NONE SEEN

## 2021-04-02 ENCOUNTER — Other Ambulatory Visit (INDEPENDENT_AMBULATORY_CARE_PROVIDER_SITE_OTHER): Payer: Self-pay | Admitting: Vascular Surgery

## 2021-04-02 DIAGNOSIS — I70249 Atherosclerosis of native arteries of left leg with ulceration of unspecified site: Secondary | ICD-10-CM

## 2021-04-07 ENCOUNTER — Ambulatory Visit (INDEPENDENT_AMBULATORY_CARE_PROVIDER_SITE_OTHER): Payer: Medicare HMO | Admitting: Nurse Practitioner

## 2021-04-07 ENCOUNTER — Other Ambulatory Visit: Payer: Self-pay

## 2021-04-07 ENCOUNTER — Ambulatory Visit (INDEPENDENT_AMBULATORY_CARE_PROVIDER_SITE_OTHER): Payer: Medicare HMO

## 2021-04-07 VITALS — BP 157/66 | HR 54 | Ht 63.0 in | Wt 200.0 lb

## 2021-04-07 DIAGNOSIS — E1142 Type 2 diabetes mellitus with diabetic polyneuropathy: Secondary | ICD-10-CM | POA: Insufficient documentation

## 2021-04-07 DIAGNOSIS — E782 Mixed hyperlipidemia: Secondary | ICD-10-CM | POA: Diagnosis not present

## 2021-04-07 DIAGNOSIS — E1151 Type 2 diabetes mellitus with diabetic peripheral angiopathy without gangrene: Secondary | ICD-10-CM | POA: Diagnosis not present

## 2021-04-07 DIAGNOSIS — I70249 Atherosclerosis of native arteries of left leg with ulceration of unspecified site: Secondary | ICD-10-CM

## 2021-04-07 MED ORDER — TRIAMCINOLONE ACETONIDE 0.5 % EX OINT: 1.0000 "application " | TOPICAL_OINTMENT | Freq: Two times a day (BID) | CUTANEOUS | 3 refills | Status: DC

## 2021-04-13 ENCOUNTER — Encounter (INDEPENDENT_AMBULATORY_CARE_PROVIDER_SITE_OTHER): Payer: Self-pay | Admitting: Nurse Practitioner

## 2021-04-13 NOTE — Progress Notes (Signed)
Subjective:    Patient ID: Elizabeth [name], female    DOB: 19-Jan-1951, 70 y.o.   MRN: [id number] Chief Complaint  Patient presents with  . Follow-up    1 Mo U/S     Elizabeth [name] is a 70 year old female that presented to [facility] on 03/03/2021.  Vascular surgery was consulted due to a nonhealing wound on her left lower extremity.  The patient did not have palpable pulses.  The patient underwent angiogram on 03/06/2021.  The patient underwent: Procedure(s) Performed:             1.  Abdominal aortogram             2.  Left lower extremity distal runoff third order catheter placement             3.  Star close right common femoral  Ultimately it was found that the patient did have atherosclerotic disease however none of her lesions were hemodynamically significant.  Therefore no intervention was done at that time.  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  She currently follows with podiatry for wound care.  Currently she denies any fevers or chills.  She denies any rest pain.  Today noninvasive studies were noncompressible bilaterally.  The right TBI 0.71 and the left is 0.79.  The patient has biphasic tibial arteries on the right with monophasic waveforms on the left.  However the patient does have normal toe waveforms bilaterally.      Review of Systems  Skin: Positive for wound.  All other systems reviewed and are negative.      Objective:   Physical Exam Vitals reviewed.  HENT:     Head: Normocephalic.  Cardiovascular:     Rate and Rhythm: Normal rate.     Pulses: Decreased pulses.  Pulmonary:     Effort: Pulmonary effort is normal.  Neurological:     Mental Status: She is alert and oriented to person, place, and time.     Motor: Weakness present.     Gait: Gait abnormal.  Psychiatric:        Mood and Affect: Mood normal.        Behavior: Behavior normal.        Thought Content: Thought content  normal.        Judgment: Judgment normal.     BP (!) 157/66   Pulse (!) 54   Ht 5\' 3"  (1.6 m)   Wt 200 lb (90.7 kg)   BMI 35.43 kg/m   Past Medical History:  Diagnosis Date  . Cancer [facility])    breast  . Carcinoma of right breast ([place]) 05/20/2014  . Diabetes mellitus without complication ([place])   . Hypertension   . Lymphedema   . Personal history of radiation therapy 2015   Right breast  . PONV (postoperative nausea and vomiting)   . Psoriasis 1990  . Stroke ([place]) 2000   residual left arm weakness  . Varicose veins     Social History   Socioeconomic History  . Marital status: Married    Spouse name: Not on file  . Number of children: Not on file  . Years of education: Not on file  . Highest education level: Not on file  Occupational History  . Not on file  Tobacco Use  . Smoking status: Never Smoker  . Smokeless tobacco: Never Used  Substance and Sexual Activity  . Alcohol use: No  Alcohol/week: 0.0 standard drinks  . Drug use: No  . Sexual activity: Yes    Birth control/protection: Post-menopausal  Other Topics Concern  . Not on file  Social History Narrative  . Not on file   Social Determinants of Health   Financial Resource Strain: Not on file  Food Insecurity: Not on file  Transportation Needs: Not on file  Physical Activity: Not on file  Stress: Not on file  Social Connections: Not on file  Intimate Partner Violence: Not on file    Past Surgical History:  Procedure Laterality Date  . AMPUTATION Left 03/04/2021   Procedure: AMPUTATION RAY;  Surgeon: Sharlotte Alamo, DPM;  Location: [facility] ORS;  Service: Podiatry;  Laterality: Left;  . AMPUTATION TOE Right 01/25/2017   Procedure: AMPUTATION TOE   ;  Surgeon: Albertine Patricia, DPM;  Location: [facility] OR[facility];  Service: Podiatry;  Laterality: Right;  . BREAST EXCISIONAL BIOPSY Right 2015   [facility] / with radiation  . BREAST LUMPECTOMY Right 04-2014   followed by radiation,  no chemo  . BREAST REDUCTION SURGERY   1977  . CHOLECYSTECTOMY N/A 09/23/2015   Procedure: LAPAROSCOPIC CHOLECYSTECTOMY;  Surgeon: Bonner Puna, MD;  Location: [facility] OR[facility];  Service: General;  Laterality: N/A;  . DILATION AND CURETTAGE OF UTERUS  2010  . EYE SURGERY Left 2015   cataracts and left eye detached retina repair  . EYE SURGERY Right 2013   cataract with len implant  . GASTRIC ROUX-EN-Y N/A 09/23/2015   Procedure: LAPAROSCOPIC ROUX-EN-Y GASTRIC;  Surgeon: Bonner Puna, MD;  Location: [facility] OR[facility];  Service: General;  Laterality: N/A;  . HIATAL HERNIA REPAIR N/A 09/23/2015   Procedure: LAPAROSCOPIC REPAIR OF HIATAL HERNIA;  Surgeon: Bonner Puna, MD;  Location: [facility] OR[facility];  Service: General;  Laterality: N/A;  . IRRIGATION AND DEBRIDEMENT FOOT Right 01/25/2017   Procedure: IRRIGATION AND DEBRIDEMENT FOOT;  Surgeon: Albertine Patricia, DPM;  Location: [facility];  Service: Podiatry;  Laterality: Right;  . IRRIGATION AND DEBRIDEMENT FOOT Right 01/29/2017   Procedure: IRRIGATION AND DEBRIDEMENT FOOT and application of wound vac;  Surgeon: Albertine Patricia, DPM;  Location: [facility];  Service: Podiatry;  Laterality: Right;  . LOWER EXTREMITY ANGIOGRAPHY Right 01/28/2017   Procedure: Lower Extremity Angiography;  Surgeon: Katha Cabal, MD;  Location: [place] [facility];  Service: Cardiovascular;  Laterality: Right;  . LOWER EXTREMITY ANGIOGRAPHY Left 03/06/2021   Procedure: Lower Extremity Angiography;  Surgeon: Katha Cabal, MD;  Location: [place] [facility];  Service: Cardiovascular;  Laterality: Left;  . LOWER EXTREMITY INTERVENTION  01/28/2017   Procedure: Lower Extremity Intervention;  Surgeon: Katha Cabal, MD;  Location: [place] [facility];  Service: Cardiovascular;;  . REDUCTION MAMMAPLASTY      Family History  Problem Relation Age of Onset  . Diabetes Mother   . Cancer Mother   . Breast cancer Mother 61  . Diabetes Father   . Diabetes Sister   . Diabetes Brother     No Known Allergies  CBC Latest Ref Rng &  Units 03/07/2021 03/06/2021 03/05/2021  WBC 4.0 - 10.5 K/uL 10.1 12.9(H) 13.2(H)  Hemoglobin 12.0 - 15.0 g/dL 9.4(L) 10.1(L) 10.6(L)  Hematocrit 36.0 - 46.0 % 29.9(L) 31.1(L) 33.0(L)  Platelets 150 - 400 K/uL 251 251 208      CMP     Component Value Date/Time   NA 141 03/07/2021 0519   NA 139 11/18/2014 1011   K 4.4 03/07/2021 0519   K 3.7 11/18/2014  1011   CL 113 (H) 03/07/2021 0519   CL 100 11/18/2014 1011   CO2 23 03/07/2021 0519   CO2 35 (H) 11/18/2014 1011   GLUCOSE 87 03/07/2021 0519   GLUCOSE 70 11/18/2014 1011   BUN 21 03/07/2021 0519   BUN 22 (H) 11/18/2014 1011   CREATININE 1.11 (H) 03/07/2021 0519   CREATININE 0.88 11/18/2014 1011   CALCIUM 8.2 (L) 03/07/2021 0519   CALCIUM 9.0 11/18/2014 1011   PROT 6.7 03/03/2021 1917   PROT 7.5 11/18/2014 1011   ALBUMIN 3.2 (L) 03/03/2021 1917   ALBUMIN 3.4 11/18/2014 1011   AST 14 (L) 03/03/2021 1917   AST 17 11/18/2014 1011   ALT 10 03/03/2021 1917   ALT 26 11/18/2014 1011   ALKPHOS 73 03/03/2021 1917   ALKPHOS 80 11/18/2014 1011   BILITOT 0.9 03/03/2021 1917   BILITOT 0.4 11/18/2014 1011   GFRNONAA 54 (L) 03/07/2021 0519   GFRNONAA >60 11/18/2014 1011   GFRNONAA >60 01/04/2014 1825   GFRAA 54 (L) 06/27/2019 0506   GFRAA >60 11/18/2014 1011   GFRAA >60 01/04/2014 1825     No results found.     Assessment & Plan:   1. 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  Her recent angiogram did reveal the patient does have some evidence of PAD but not significant enough for intervention.  Due to this we will continue to follow the patient for noninvasive studies.  We will have the patient return in 6 months with noninvasive studies to evaluate her perfusion.  However patient advised that if her wound worsens or she develops new wounds to follow-up with [place] sooner for further evaluation.  2. Mixed  hyperlipidemia Continue statin as ordered and reviewed, no changes at this time   3. 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  Hgb A1C to be monitored as already arranged by primary service    Current Outpatient Medications on File Prior to Visit  Medication Sig Dispense Refill  . acetaminophen (TYLENOL) 325 MG tablet Take 2 tablets (650 mg total) by mouth every 6 (six) hours as needed for mild pain (or Fever >/= 101).    [name] albuterol (PROVENTIL HFA;VENTOLIN HFA) 108 (90 BASE) MCG/ACT inhaler Inhale 2 puffs into the lungs every 6 (six) hours as needed for wheezing or shortness of breath.    [name] aspirin 81 MG EC tablet Bayer Low Dose Aspirin 81 mg tablet,delayed release  Take 1 tablet every day by oral route.    . calcium-vitamin D (OSCAL WITH D) 500-200 MG-UNIT tablet Take 2 tablets by mouth daily with breakfast. 180 tablet 3  . carvedilol (COREG) 12.5 MG tablet Take by mouth.    . clopidogrel (PLAVIX) 75 MG tablet Take 75 mg by mouth.    [name] DIFLUCAN 150 MG tablet Take by mouth.    . furosemide (LASIX) 20 MG tablet Take 20 mg by mouth daily.    [name] gabapentin (NEURONTIN) 300 MG capsule Take 300 mg by mouth 3 (three) times daily.     [name] glimepiride (AMARYL) 2 MG tablet Take by mouth.    [name] HYDROcodone-acetaminophen (NORCO/VICODIN) 5-325 MG tablet TAKE 1-2 TABLETS BY MOUTH EVERY 4 (FOUR) HOURS AS NEEDED FOR UP TO 7 DAYS FOR MODERATE PAIN.    [name] letrozole (FEMARA) 2.5 MG  tablet Take 1 tablet (2.5 mg total) by mouth daily. 90 tablet 3  . linagliptin (TRADJENTA) 5 MG TABS tablet Take 5 mg by mouth daily.    . metFORMIN (GLUCOPHAGE) 1000 MG tablet Take 1,000 mg by mouth daily with breakfast.     . mupirocin ointment (BACTROBAN) 2 % Apply topically.    . ondansetron (ZOFRAN) 4 MG tablet Take 1 tablet (4 mg total) by mouth every 6 (six) hours as needed  for nausea. 20 tablet 0  . PARoxetine (PAXIL) 40 MG tablet Take 40 mg by mouth daily.    . potassium chloride (MICRO-K) 10 MEQ CR capsule Take 10 mEq by mouth daily.     . pravastatin (PRAVACHOL) 40 MG tablet Take 40 mg by mouth daily. In afternoon    . TRUE METRIX BLOOD GLUCOSE TEST test strip     . TRUEPLUS LANCETS 33G MISC      No current facility-administered medications on file prior to visit.    There are no Patient Instructions on file for this visit. No follow-ups on file.   Kris Hartmann, NP

## 2021-04-26 ENCOUNTER — Emergency Department: Payer: Medicare HMO

## 2021-04-26 ENCOUNTER — Other Ambulatory Visit: Payer: Self-pay

## 2021-04-26 ENCOUNTER — Encounter: Payer: Self-pay | Admitting: Emergency Medicine

## 2021-04-26 DIAGNOSIS — N179 Acute kidney failure, unspecified: Secondary | ICD-10-CM | POA: Diagnosis not present

## 2021-04-26 DIAGNOSIS — Z20822 Contact with and (suspected) exposure to covid-19: Secondary | ICD-10-CM | POA: Insufficient documentation

## 2021-04-26 DIAGNOSIS — Z853 Personal history of malignant neoplasm of breast: Secondary | ICD-10-CM | POA: Diagnosis not present

## 2021-04-26 DIAGNOSIS — G459 Transient cerebral ischemic attack, unspecified: Principal | ICD-10-CM | POA: Insufficient documentation

## 2021-04-26 DIAGNOSIS — I10 Essential (primary) hypertension: Secondary | ICD-10-CM | POA: Insufficient documentation

## 2021-04-26 DIAGNOSIS — M6281 Muscle weakness (generalized): Secondary | ICD-10-CM | POA: Diagnosis not present

## 2021-04-26 DIAGNOSIS — Z7982 Long term (current) use of aspirin: Secondary | ICD-10-CM | POA: Diagnosis not present

## 2021-04-26 DIAGNOSIS — E119 Type 2 diabetes mellitus without complications: Secondary | ICD-10-CM | POA: Insufficient documentation

## 2021-04-26 DIAGNOSIS — Z7984 Long term (current) use of oral hypoglycemic drugs: Secondary | ICD-10-CM | POA: Diagnosis not present

## 2021-04-26 DIAGNOSIS — Z79899 Other long term (current) drug therapy: Secondary | ICD-10-CM | POA: Diagnosis not present

## 2021-04-26 DIAGNOSIS — R531 Weakness: Secondary | ICD-10-CM | POA: Diagnosis present

## 2021-04-26 LAB — CBC
HCT: 33.2 % — ABNORMAL LOW (ref 36.0–46.0)
Hemoglobin: 10.4 g/dL — ABNORMAL LOW (ref 12.0–15.0)
MCH: 27.4 pg (ref 26.0–34.0)
MCHC: 31.3 g/dL (ref 30.0–36.0)
MCV: 87.4 fL (ref 80.0–100.0)
Platelets: 282 10*3/uL (ref 150–400)
RBC: 3.8 MIL/uL — ABNORMAL LOW (ref 3.87–5.11)
RDW: 14.6 % (ref 11.5–15.5)
WBC: 9.2 10*3/uL (ref 4.0–10.5)
nRBC: 0 % (ref 0.0–0.2)

## 2021-04-26 LAB — DIFFERENTIAL
Abs Immature Granulocytes: 0.03 10*3/uL (ref 0.00–0.07)
Basophils Absolute: 0.1 10*3/uL (ref 0.0–0.1)
Basophils Relative: 1 %
Eosinophils Absolute: 0.3 10*3/uL (ref 0.0–0.5)
Eosinophils Relative: 3 %
Immature Granulocytes: 0 %
Lymphocytes Relative: 23 %
Lymphs Abs: 2.1 10*3/uL (ref 0.7–4.0)
Monocytes Absolute: 0.8 10*3/uL (ref 0.1–1.0)
Monocytes Relative: 9 %
Neutro Abs: 5.8 10*3/uL (ref 1.7–7.7)
Neutrophils Relative %: 64 %

## 2021-04-26 LAB — CBG MONITORING, ED: Glucose-Capillary: 111 mg/dL — ABNORMAL HIGH (ref 70–99)

## 2021-04-26 LAB — COMPREHENSIVE METABOLIC PANEL
ALT: 10 U/L (ref 0–44)
AST: 13 U/L — ABNORMAL LOW (ref 15–41)
Albumin: 3.4 g/dL — ABNORMAL LOW (ref 3.5–5.0)
Alkaline Phosphatase: 74 U/L (ref 38–126)
Anion gap: 3 — ABNORMAL LOW (ref 5–15)
BUN: 22 mg/dL (ref 8–23)
CO2: 32 mmol/L (ref 22–32)
Calcium: 8.6 mg/dL — ABNORMAL LOW (ref 8.9–10.3)
Chloride: 105 mmol/L (ref 98–111)
Creatinine, Ser: 1.42 mg/dL — ABNORMAL HIGH (ref 0.44–1.00)
GFR, Estimated: 40 mL/min — ABNORMAL LOW (ref >60)
Glucose, Bld: 114 mg/dL — ABNORMAL HIGH (ref 70–99)
Potassium: 4.5 mmol/L (ref 3.5–5.1)
Sodium: 140 mmol/L (ref 135–145)
Total Bilirubin: 0.5 mg/dL (ref 0.3–1.2)
Total Protein: 6.6 g/dL (ref 6.5–8.1)

## 2021-04-26 LAB — PROTIME-INR
INR: 0.9 (ref 0.8–1.2)
Prothrombin Time: 12.2 seconds (ref 11.4–15.2)

## 2021-04-26 LAB — APTT: aPTT: 27 seconds (ref 24–36)

## 2021-04-26 LAB — LACTIC ACID, PLASMA: Lactic Acid, Venous: 0.7 mmol/L (ref 0.5–1.9)

## 2021-04-26 IMAGING — CT CT HEAD W/O CM
3 series · 16 of 47 positions shown, 19 images · non-contrast
Comparison: None.

CLINICAL DATA: Word-finding difficulty

EXAM:
CT HEAD WITHOUT CONTRAST
TECHNIQUE: Contiguous axial images were obtained from the base of the skull
through the vertex without intravenous contrast.

[Series 2: head wo · axial · 0.47mm/px · z∈[-114,+26]mm · 10 of 34 slices shown, 13 images]
[im 3/34  brain]
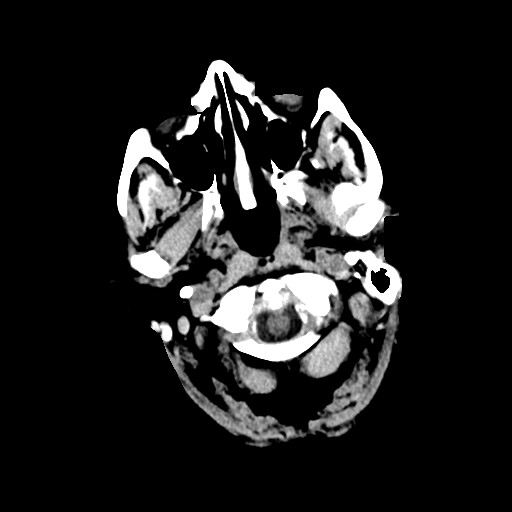
[im 3/34  bone]
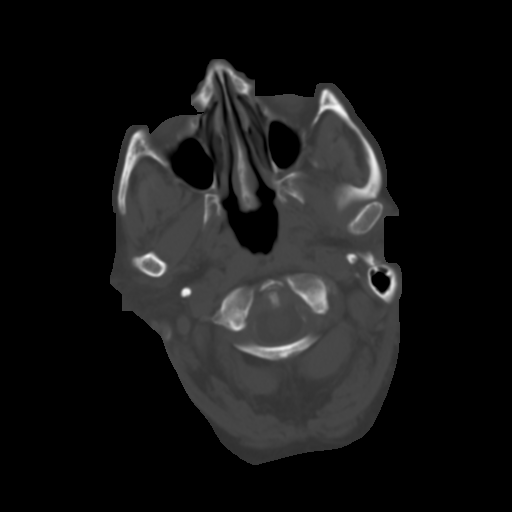
[im 6/34  brain]
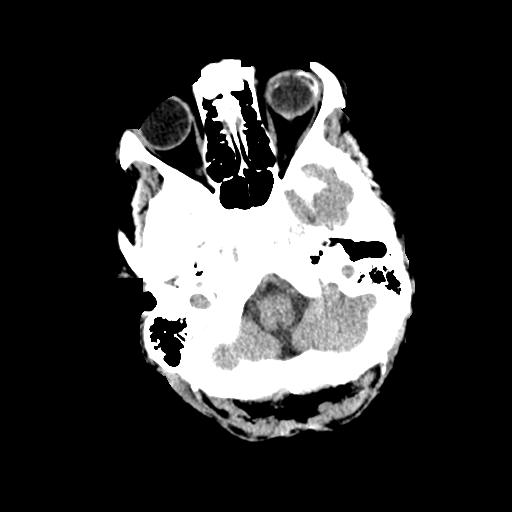
[im 10/34  brain]
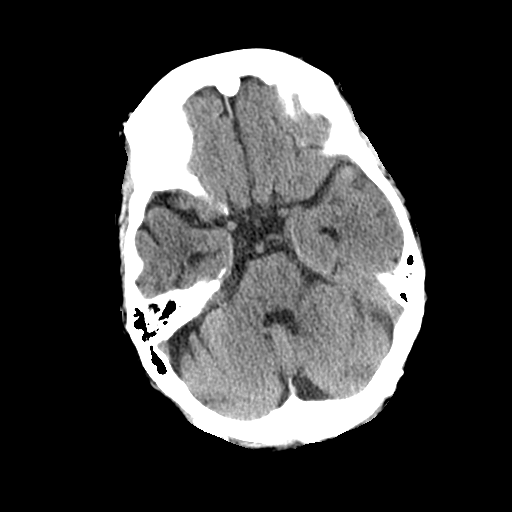
[im 12/34  brain]
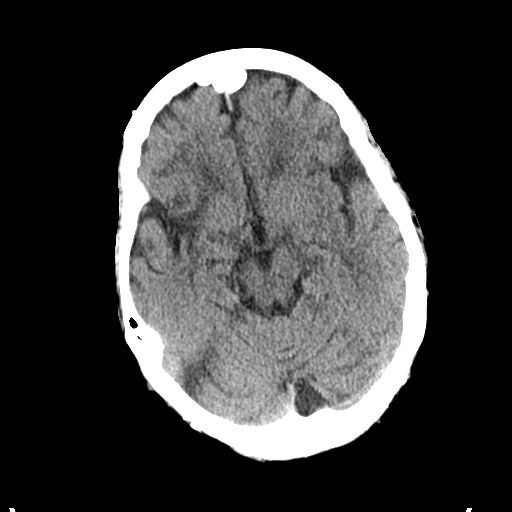
[im 15/34  brain]
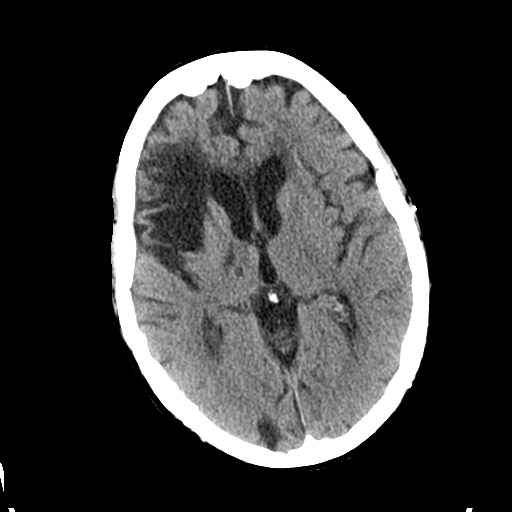
[im 15/34  bone]
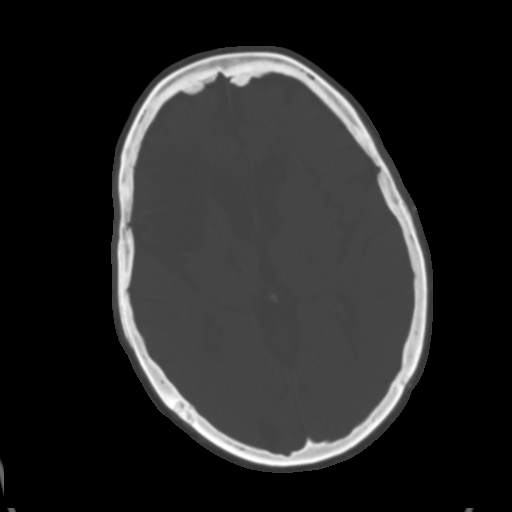
[im 19/34  brain]
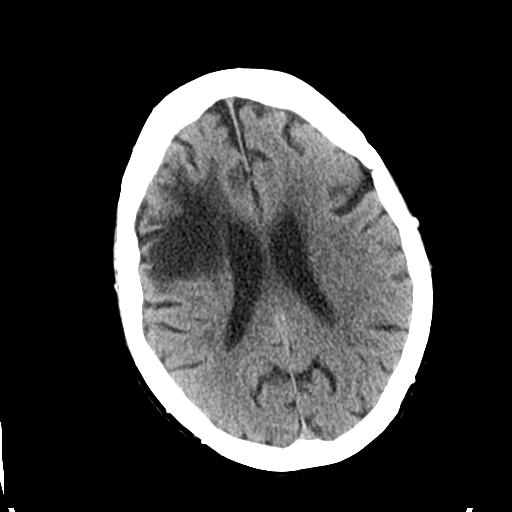
[im 22/34  brain]
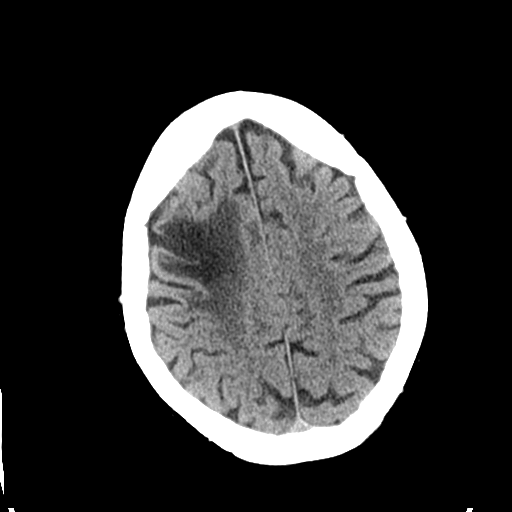
[im 26/34  brain]
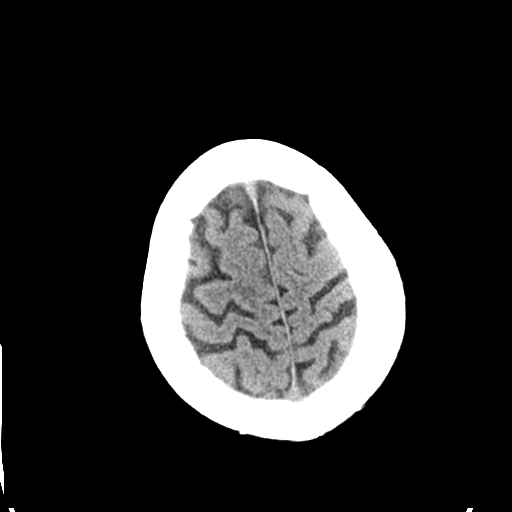
[im 28/34  brain]
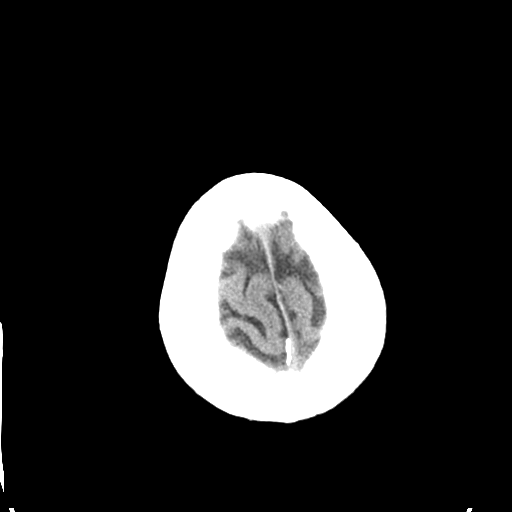
[im 28/34  bone]
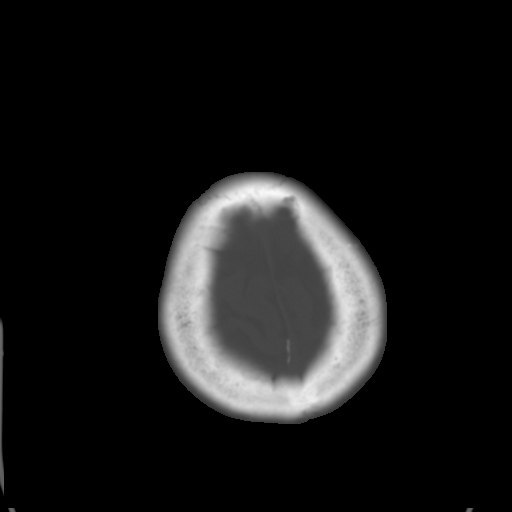
[im 31/34  brain]
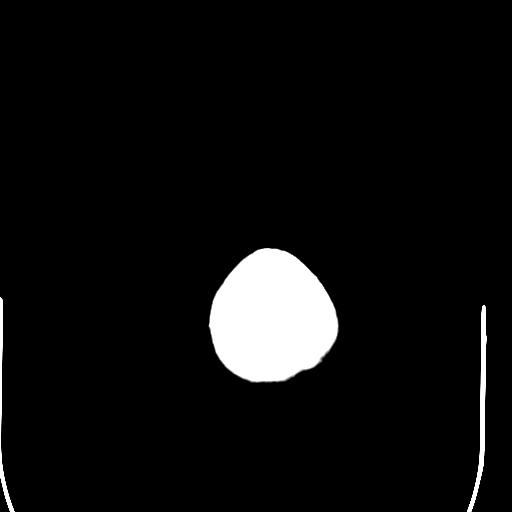

[Series 4: coronal soft tissue · coronal · 0.34mm/px · 3 of 73 slices shown]
[im 25/73  brain]
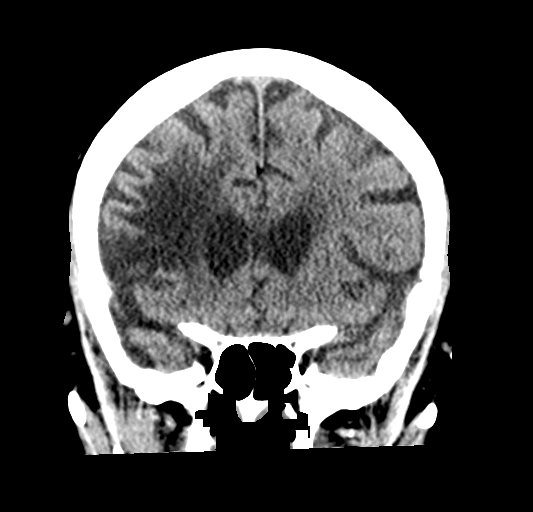
[im 33/73  brain]
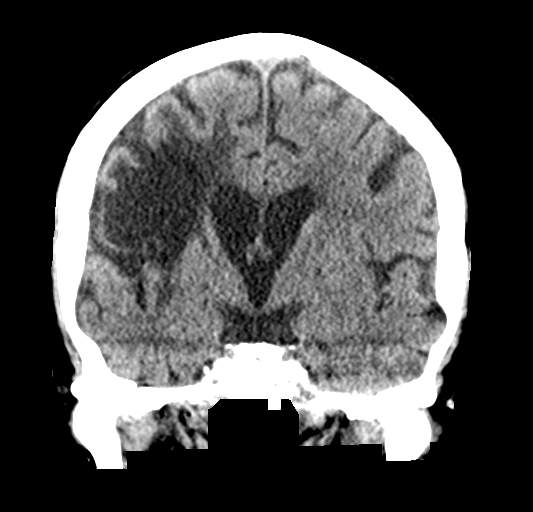
[im 41/73  brain]
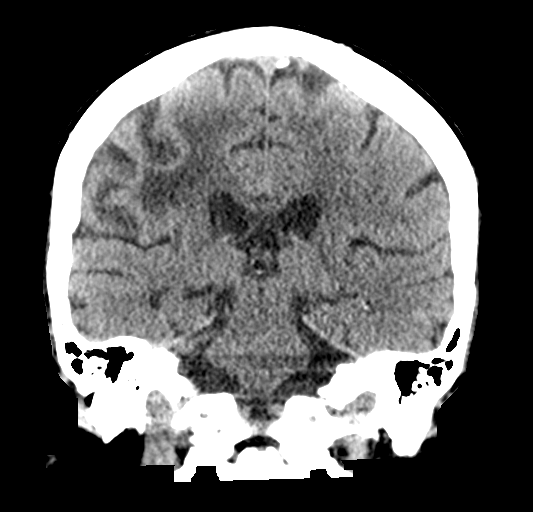

[Series 5: sagittal soft tissue · sagittal · 0.35mm/px · 3 of 58 slices shown]
[im 20/58  brain]
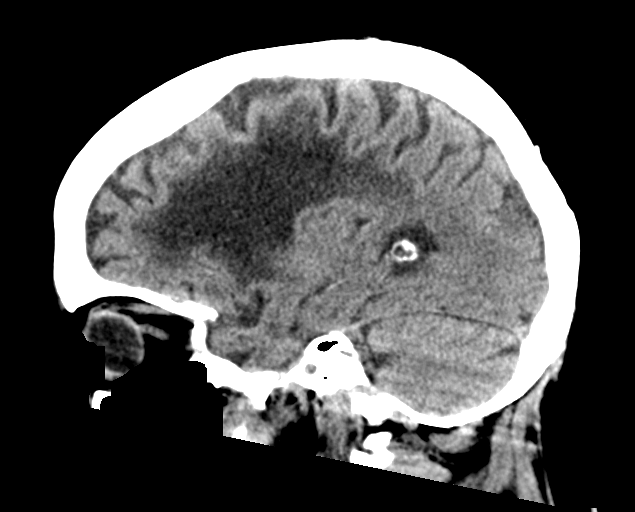
[im 29/58  brain]
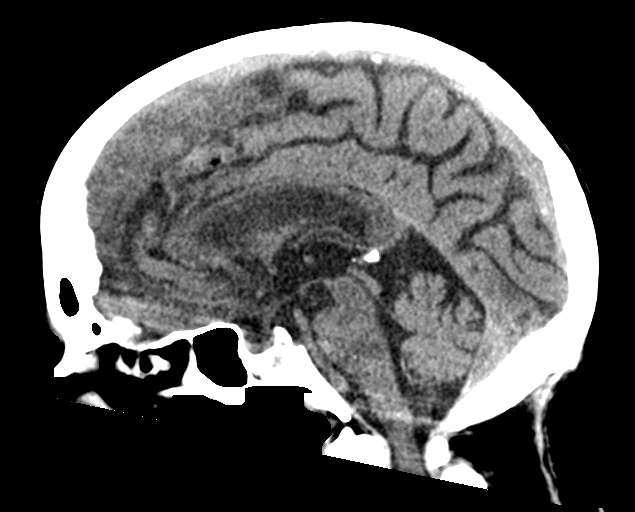
[im 39/58  brain]
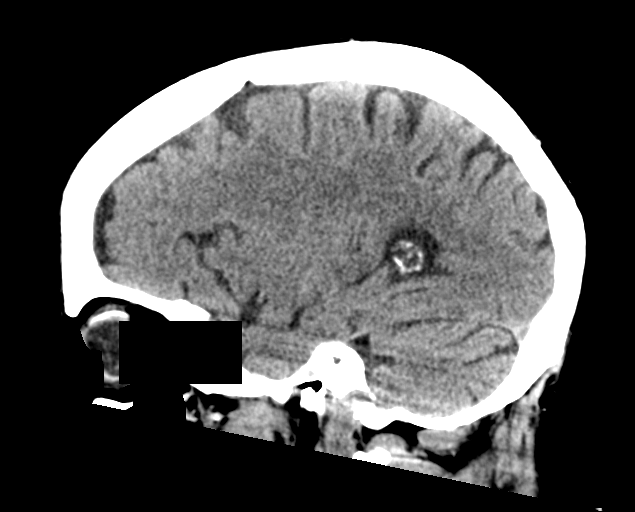

[16 of 47 positions shown; findings below may reference images not displayed]

FINDINGS: Brain: Old anterior right MCA territory infarct. No hemorrhage or
other acute abnormality. Right occipital chronic infarct.

Vascular: Atherosclerotic calcification of the vertebral and
internal carotid arteries at the skull base. No abnormal
hyperdensity of the major intracranial arteries or dural venous
sinuses.

Skull: The visualized skull base, calvarium and extracranial soft
tissues are normal.

Sinuses/Orbits: No fluid levels or advanced mucosal thickening of
the visualized paranasal sinuses. No mastoid or middle ear effusion.
The orbits are normal.
IMPRESSION: 1. No acute intracranial abnormality.
2. Old anterior right MCA territory infarct.

## 2021-04-26 NOTE — ED Triage Notes (Signed)
Pt also states she has been in the hospital recently for osteomyelitis. Pt states she stopped taking her antibiotics early.

## 2021-04-26 NOTE — ED Triage Notes (Signed)
Pt arrived via POV with reports of having difficulty finding her words this evening. Pt reports around 9pm she was trying to find her medication when she started having difficulty finding her words.  Husband states he was asleep when it happened and his granddaughter woke him up to tell him something was wrong.  Pt states it lasted about 30 minutes as well patient having difficulty moving.    Pt is a diabetic takes metformin and tradjenta.  Husband states he had to keep telling her to move her feet.  Husband states her sxs improved on the car ride over.  Pt's daughter called prior to patient's arrival and stated the last known well was at 2052.  Pt currently has no sxs at this time. E  Pt states about 30 minutes prior to speech difficulty she had a headache.

## 2021-04-27 ENCOUNTER — Observation Stay: Payer: Medicare HMO

## 2021-04-27 ENCOUNTER — Observation Stay
Admission: EM | Admit: 2021-04-27 | Discharge: 2021-04-28 | Disposition: A | Discharge status: Home / Self Care | Payer: Medicare HMO | Attending: Internal Medicine | Admitting: Internal Medicine

## 2021-04-27 ENCOUNTER — Observation Stay
Admit: 2021-04-27 | Discharge: 2021-04-27 | Disposition: A | Discharge status: Home / Self Care | Payer: Medicare HMO | Attending: Family Medicine | Admitting: Family Medicine

## 2021-04-27 DIAGNOSIS — E860 Dehydration: Secondary | ICD-10-CM | POA: Diagnosis not present

## 2021-04-27 DIAGNOSIS — N179 Acute kidney failure, unspecified: Secondary | ICD-10-CM

## 2021-04-27 DIAGNOSIS — R4702 Dysphasia: Secondary | ICD-10-CM

## 2021-04-27 DIAGNOSIS — G459 Transient cerebral ischemic attack, unspecified: Secondary | ICD-10-CM | POA: Diagnosis present

## 2021-04-27 DIAGNOSIS — I10 Essential (primary) hypertension: Secondary | ICD-10-CM

## 2021-04-27 LAB — LIPID PANEL
Cholesterol: 132 mg/dL (ref 0–200)
HDL: 65 mg/dL (ref >40)
LDL Cholesterol: 57 mg/dL (ref 0–99)
Total CHOL/HDL Ratio: 2 RATIO
Triglycerides: 52 mg/dL (ref <150)
VLDL: 10 mg/dL (ref 0–40)

## 2021-04-27 LAB — URINALYSIS, COMPLETE (UACMP) WITH MICROSCOPIC
Bacteria, UA: NONE SEEN
Bilirubin Urine: NEGATIVE
Glucose, UA: NEGATIVE mg/dL
Hgb urine dipstick: NEGATIVE
Ketones, ur: NEGATIVE mg/dL
Leukocytes,Ua: NEGATIVE
Nitrite: NEGATIVE
Protein, ur: NEGATIVE mg/dL
Specific Gravity, Urine: 1.011 (ref 1.005–1.030)
pH: 5 (ref 5.0–8.0)

## 2021-04-27 LAB — CBC
HCT: 31.8 % — ABNORMAL LOW (ref 36.0–46.0)
Hemoglobin: 10.1 g/dL — ABNORMAL LOW (ref 12.0–15.0)
MCH: 27.2 pg (ref 26.0–34.0)
MCHC: 31.8 g/dL (ref 30.0–36.0)
MCV: 85.7 fL (ref 80.0–100.0)
Platelets: 263 10*3/uL (ref 150–400)
RBC: 3.71 MIL/uL — ABNORMAL LOW (ref 3.87–5.11)
RDW: 14.7 % (ref 11.5–15.5)
WBC: 8.9 10*3/uL (ref 4.0–10.5)
nRBC: 0 % (ref 0.0–0.2)

## 2021-04-27 LAB — BASIC METABOLIC PANEL
Anion gap: 6 (ref 5–15)
BUN: 23 mg/dL (ref 8–23)
CO2: 30 mmol/L (ref 22–32)
Calcium: 8.3 mg/dL — ABNORMAL LOW (ref 8.9–10.3)
Chloride: 105 mmol/L (ref 98–111)
Creatinine, Ser: 1.14 mg/dL — ABNORMAL HIGH (ref 0.44–1.00)
GFR, Estimated: 52 mL/min — ABNORMAL LOW (ref >60)
Glucose, Bld: 102 mg/dL — ABNORMAL HIGH (ref 70–99)
Potassium: 4.1 mmol/L (ref 3.5–5.1)
Sodium: 141 mmol/L (ref 135–145)

## 2021-04-27 LAB — HEMOGLOBIN A1C
Hgb A1c MFr Bld: 5.7 % — ABNORMAL HIGH (ref 4.8–5.6)
Mean Plasma Glucose: 117 mg/dL

## 2021-04-27 LAB — TROPONIN I (HIGH SENSITIVITY): Troponin I (High Sensitivity): 5 ng/L (ref <18)

## 2021-04-27 LAB — RESP PANEL BY RT-PCR (FLU A&B, COVID) ARPGX2
Influenza A by PCR: NEGATIVE
Influenza B by PCR: NEGATIVE
SARS Coronavirus 2 by RT PCR: NEGATIVE

## 2021-04-27 LAB — GLUCOSE, CAPILLARY
Glucose-Capillary: 104 mg/dL — ABNORMAL HIGH (ref 70–99)
Glucose-Capillary: 91 mg/dL (ref 70–99)

## 2021-04-27 IMAGING — US US CAROTID DUPLEX BILAT
1 series · 13 of 24 positions shown · non-contrast
Comparison: Head CT earlier today.

CLINICAL DATA: 69-year-old female with word finding difficulty.

EXAM:
BILATERAL CAROTID DUPLEX ULTRASOUND
TECHNIQUE: Gray scale imaging, color Doppler and duplex ultrasound were
performed of bilateral carotid and vertebral arteries in the neck.

[Series 1: us carotid bilateral · 13 of 64 slices shown]
[im 1/64]
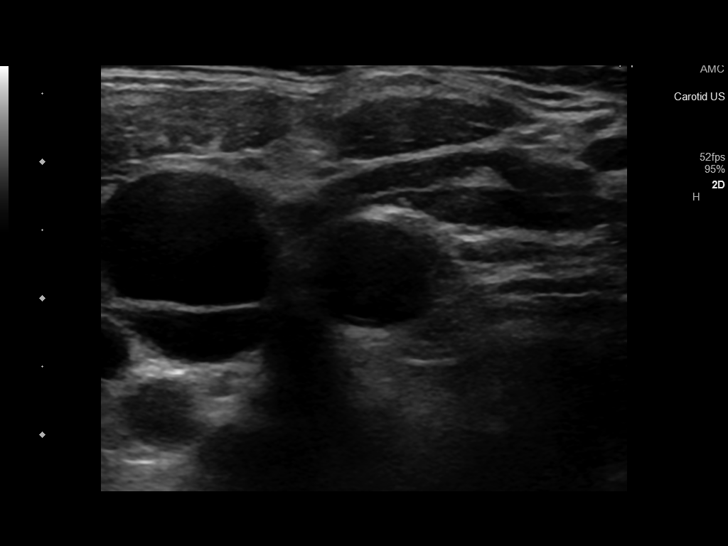
[im 6/64]
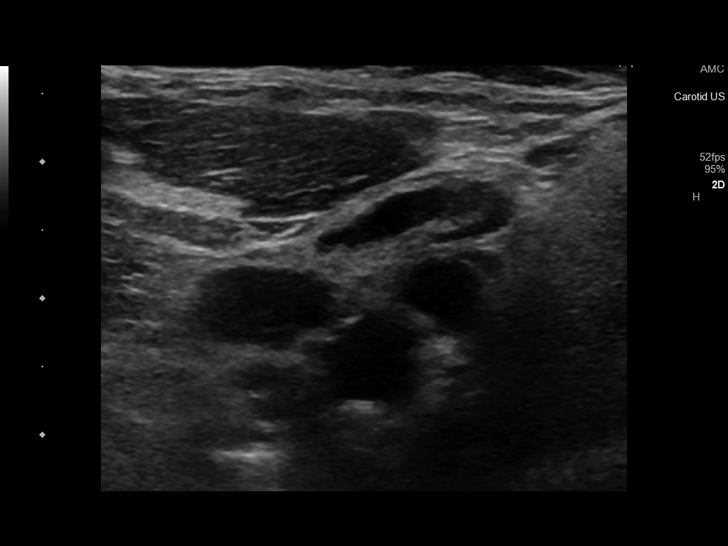
[im 11/64]
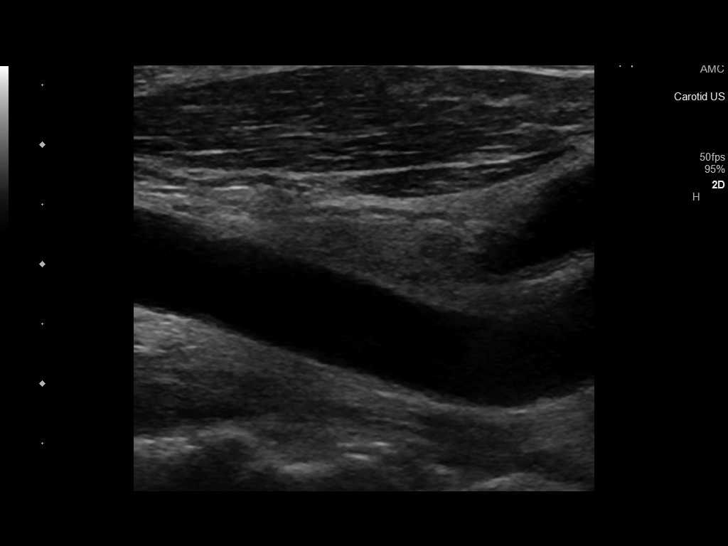
[im 17/64]
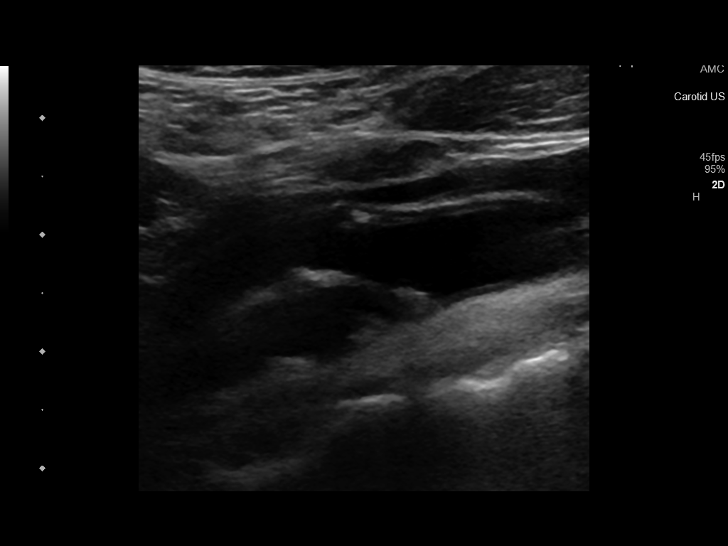
[im 22/64]
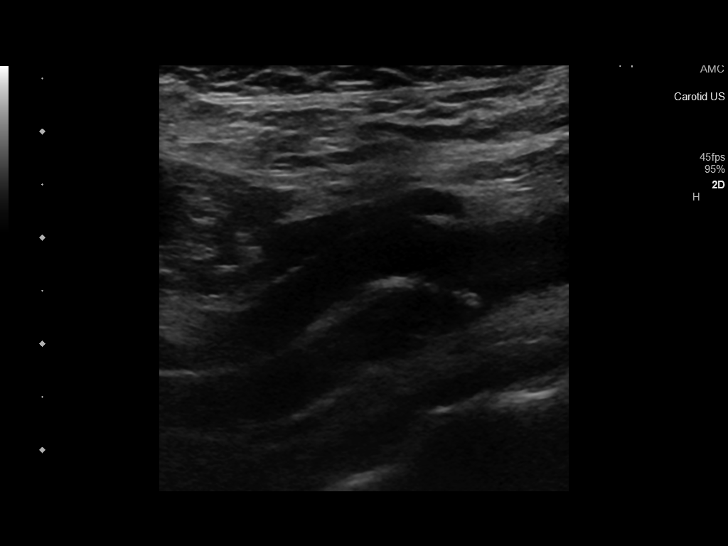
[im 28/64]
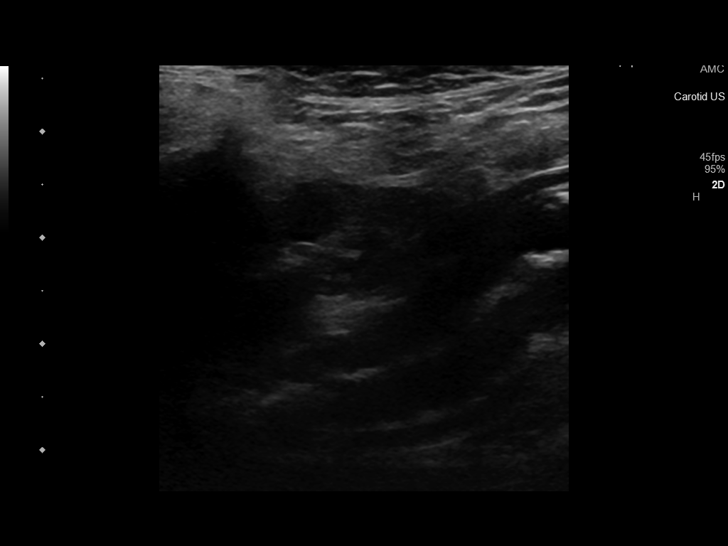
[im 33/64]
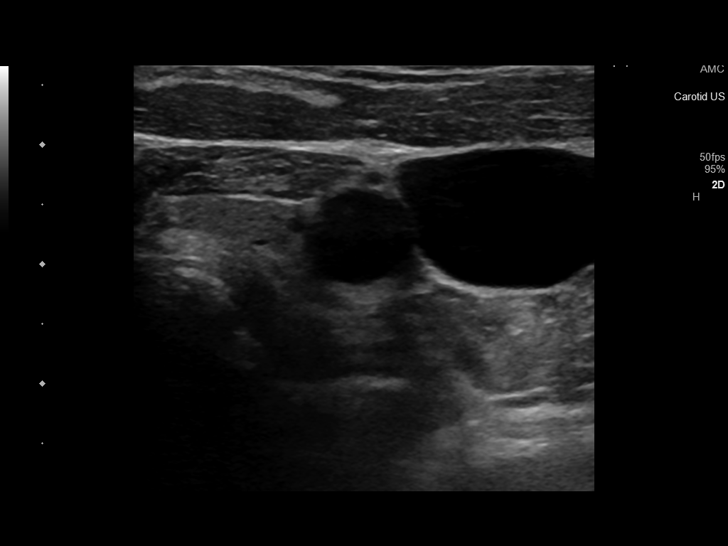
[im 36/64]
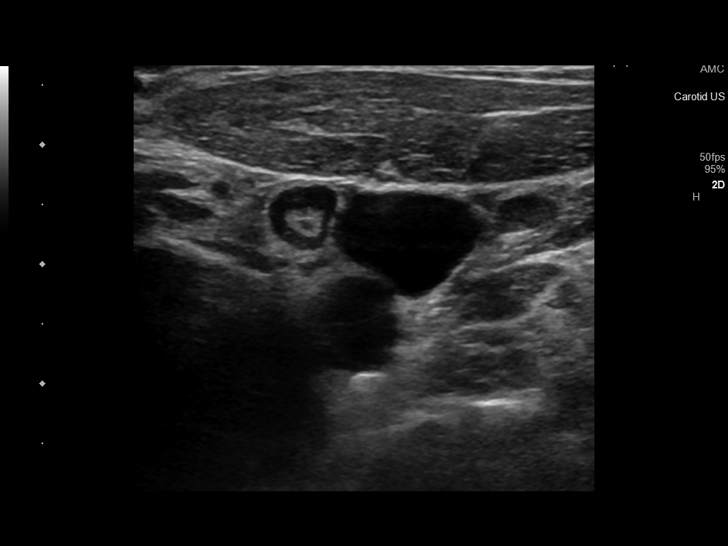
[im 42/64]
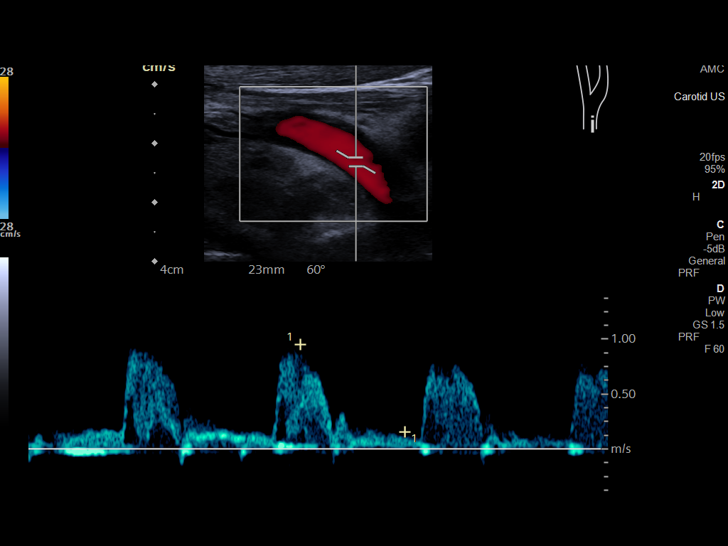
[im 47/64]
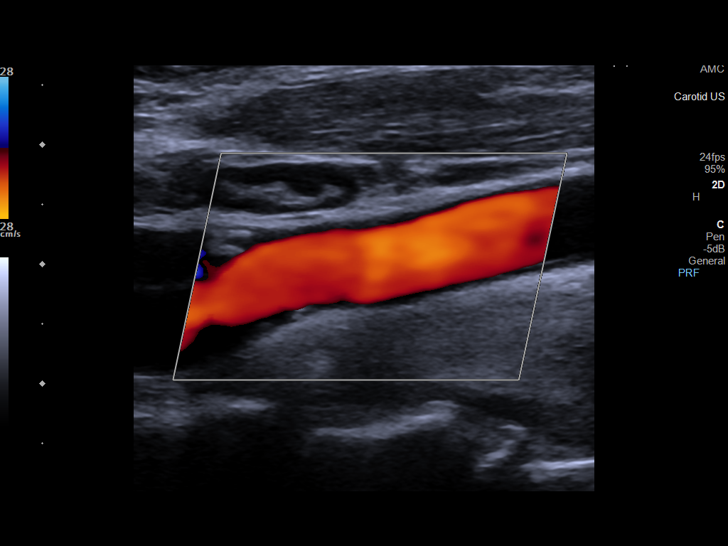
[im 53/64]
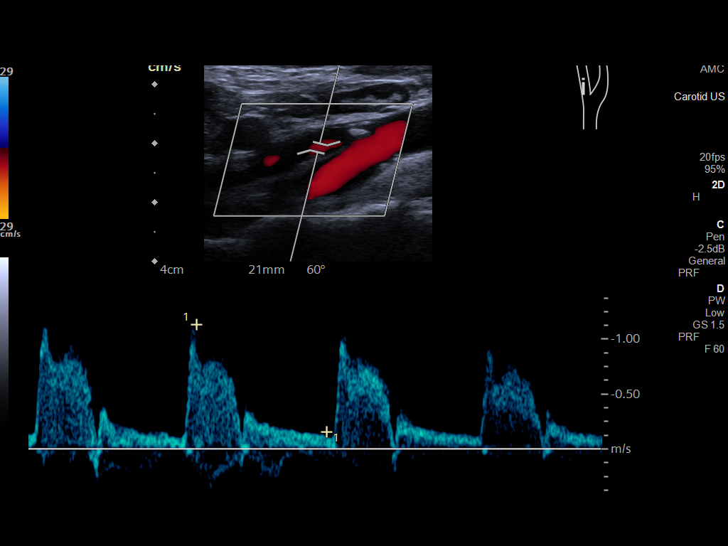
[im 58/64]
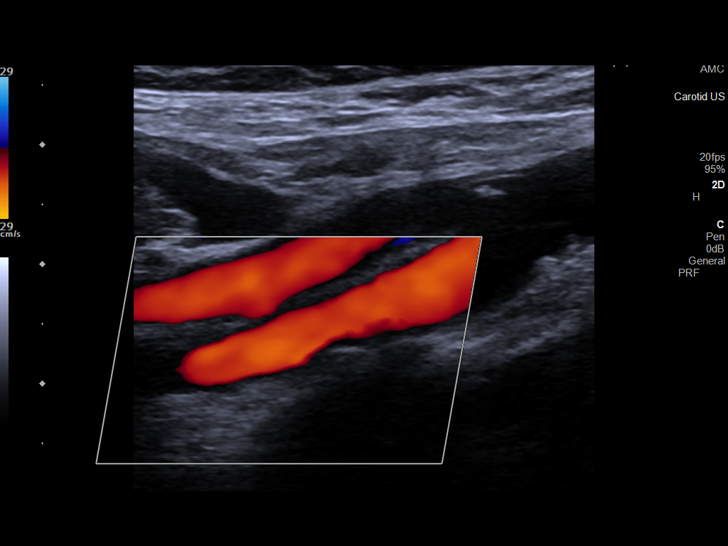
[im 64/64]
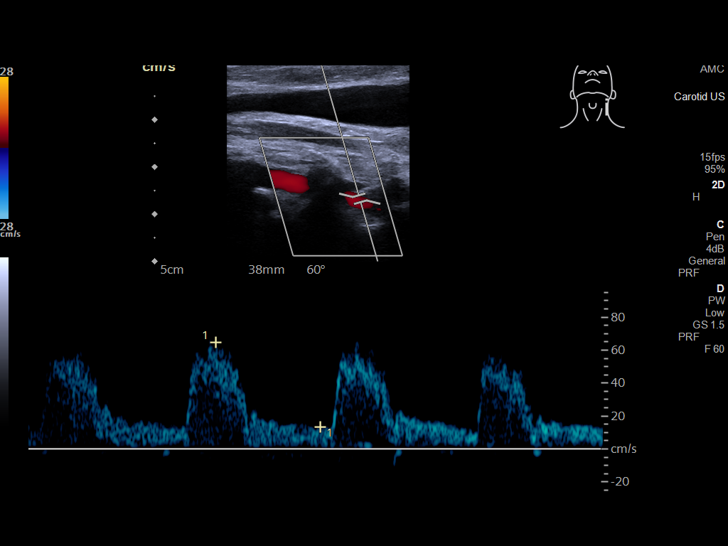

[13 of 24 positions shown; findings below may reference images not displayed]

FINDINGS: Criteria: Quantification of carotid stenosis is based on velocity
parameters that correlate the residual internal carotid diameter
with NASCET-based stenosis levels, using the diameter of the distal
internal carotid lumen as the denominator for stenosis measurement.

The following velocity measurements were obtained:

RIGHT

ICA: 79 cm/sec

CCA: 87 cm/sec

SYSTOLIC ICA/CCA RATIO:

ECA: 76 cm/sec

LEFT

ICA: 73 cm/sec

CCA: 71 cm/sec

SYSTOLIC ICA/CCA RATIO:

ECA: 113 cm/sec

RIGHT CAROTID ARTERY: Mild to moderate atherosclerosis evident at
the bulb (image 5).

RIGHT VERTEBRAL ARTERY:  Patent with antegrade flow.

LEFT CAROTID ARTERY: Minimal to mild atherosclerosis evident at the
bifurcation (image 50), also mild atherosclerosis at the distal
bulb.

LEFT VERTEBRAL ARTERY:  Patent with antegrade flow.
IMPRESSION: Right greater than left carotid bifurcation atherosclerosis with
less than 50% stenosis.

## 2021-04-27 IMAGING — MR MR HEAD W/O CM
12 series · 46 of 48 positions shown · non-contrast
Comparison: CT head [DATE].

CLINICAL DATA: Transient ischemic attack.

EXAM:
MRI HEAD WITHOUT CONTRAST
TECHNIQUE: Multiplanar, multiecho pulse sequences of the brain and surrounding
structures were obtained without intravenous contrast.

[Series 5: ax dwi_tracew · axial · 3.0mm · 0.65mm/px · z∈[-82,+72]mm · 3 of 48 slices shown]
[im 1/48]
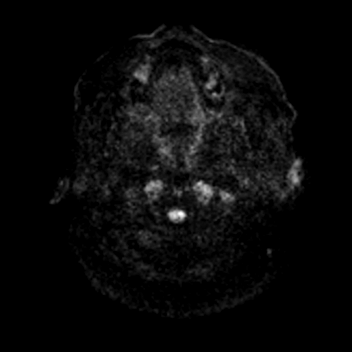
[im 24/48]
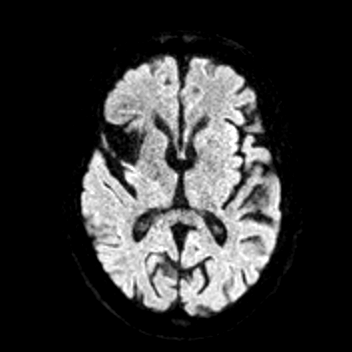
[im 48/48]
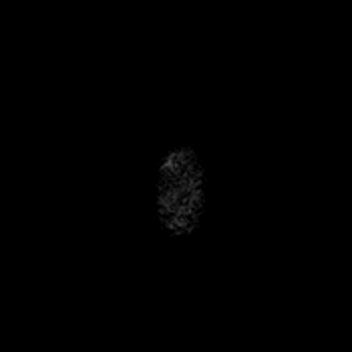

[Series 6: ax dwi_adc · axial · 3.0mm · 0.65mm/px · z∈[-82,+72]mm · 4 of 48 slices shown]
[im 1/48]
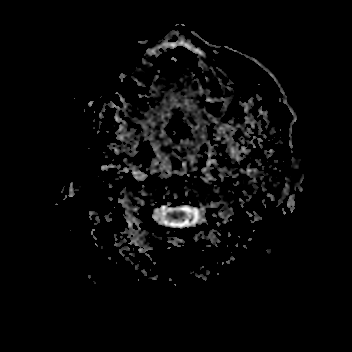
[im 16/48]
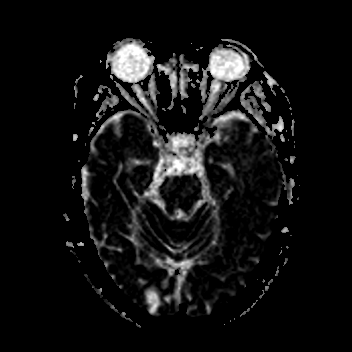
[im 32/48]
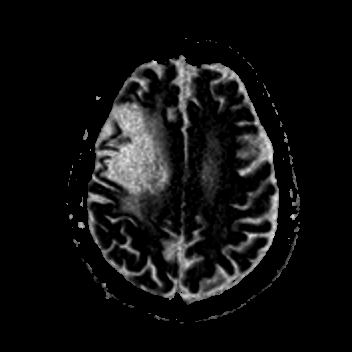
[im 48/48]
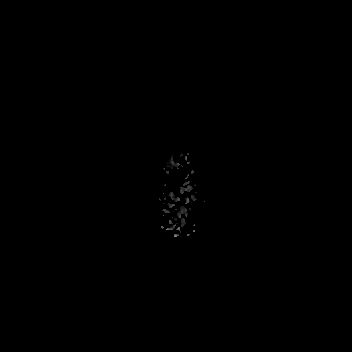

[Series 7: cor dwi_tracew · coronal · 5.0mm · 0.60mm/px · 3 of 38 slices shown]
[im 1/38]
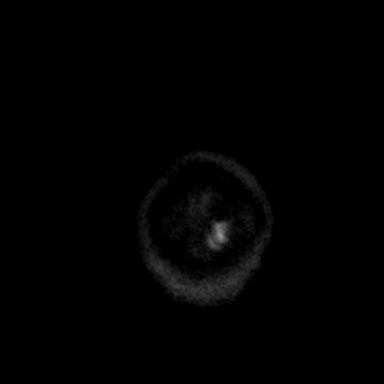
[im 19/38]
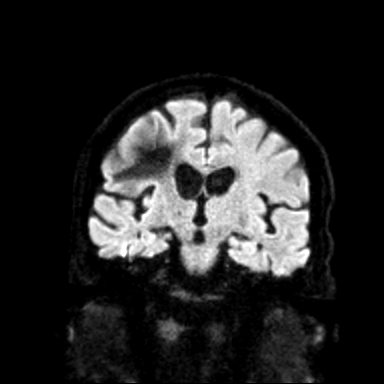
[im 38/38]
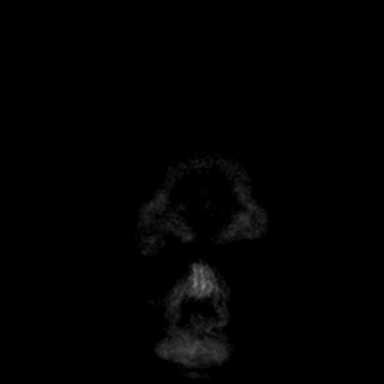

[Series 8: cor dwi_adc · coronal · 5.0mm · 0.60mm/px · 3 of 37 slices shown]
[im 1/37]
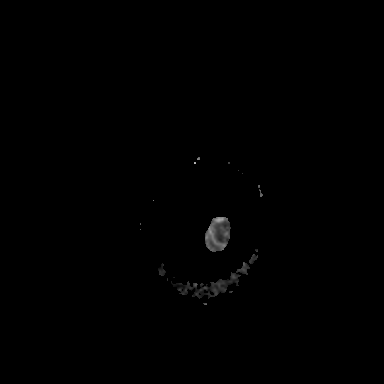
[im 19/37]
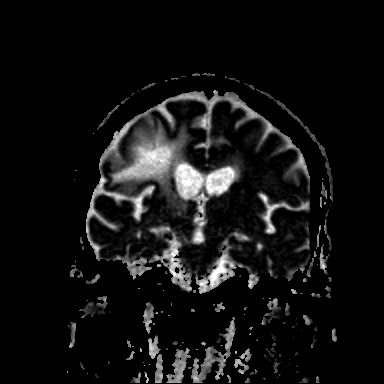
[im 37/37]
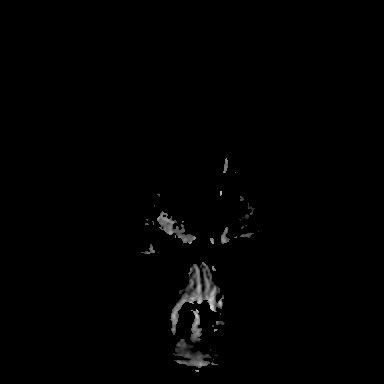

[Series 9: T1 · sagittal · 5.0mm · 0.62mm/px · 2 of 25 slices shown (1 of 2)]
[im 1/25]
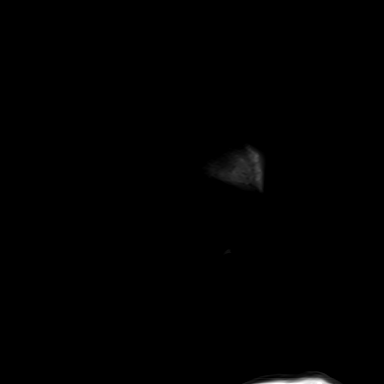
[im 25/25]
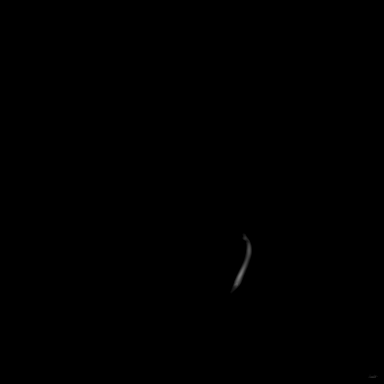

[Series 10: T2 · axial · 5.0mm · 0.53mm/px · z∈[-74,+69]mm · 2 of 25 slices shown (1 of 2)]
[im 1/25]
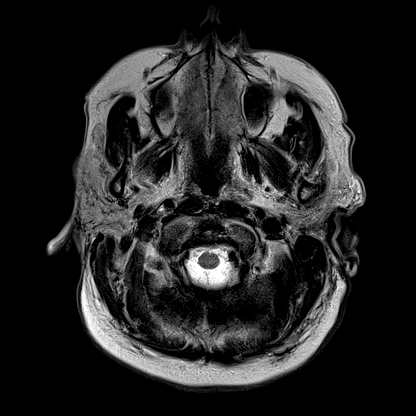
[im 25/25]
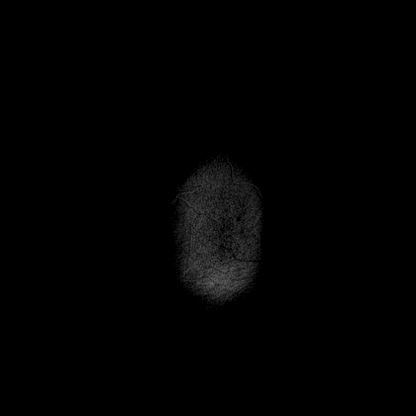

[Series 11: mag_images · axial · 3.0mm · 0.90mm/px · z∈[-90,+85]mm · 4 of 60 slices shown]
[im 1/60]
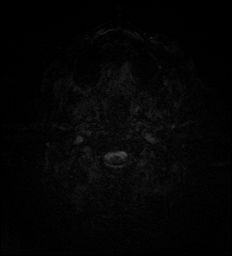
[im 20/60]
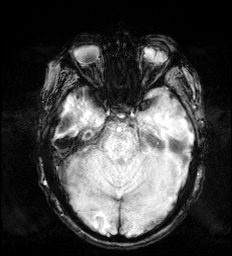
[im 40/60]
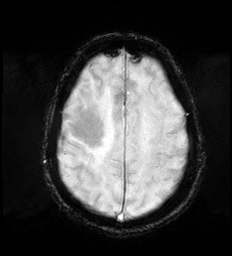
[im 60/60]
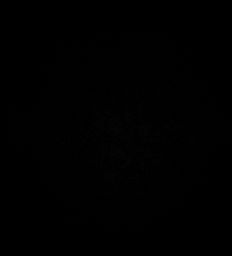

[Series 12: pha_images · axial · 3.0mm · 0.90mm/px · z∈[-90,+85]mm · 4 of 57 slices shown]
[im 1/57]
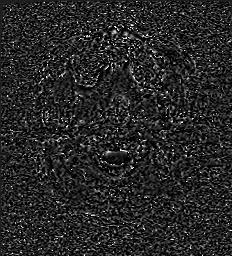
[im 19/57]
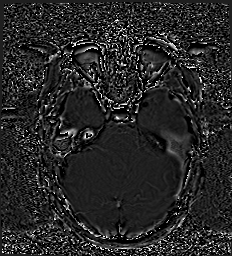
[im 38/57]
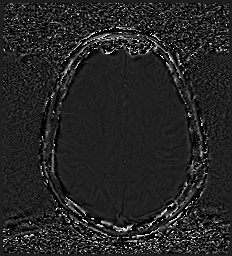
[im 57/57]
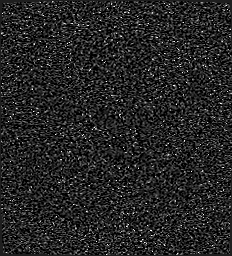

[Series 13: swi_images · axial · 3.0mm · 0.90mm/px · z∈[-90,+85]mm · 4 of 60 slices shown]
[im 1/60]
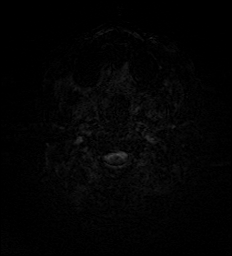
[im 20/60]
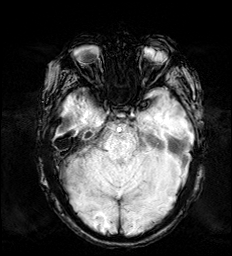
[im 40/60]
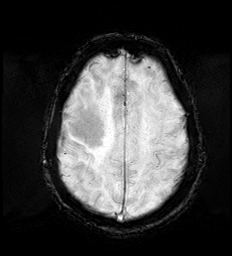
[im 60/60]
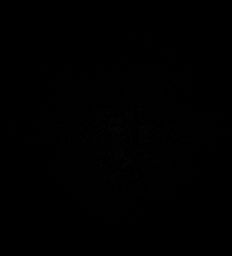

[Series 15: FLAIR · axial · 3.0mm · 0.53mm/px · z∈[-83,+77]mm · 4 of 55 slices shown]
[im 1/55]
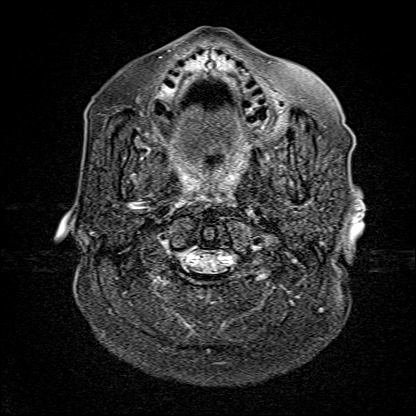
[im 19/55]
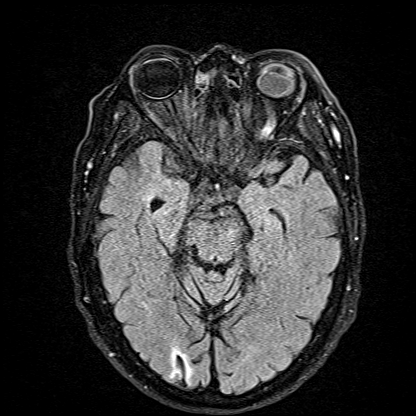
[im 37/55]
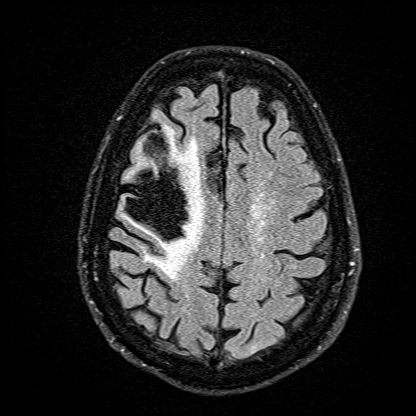
[im 55/55]
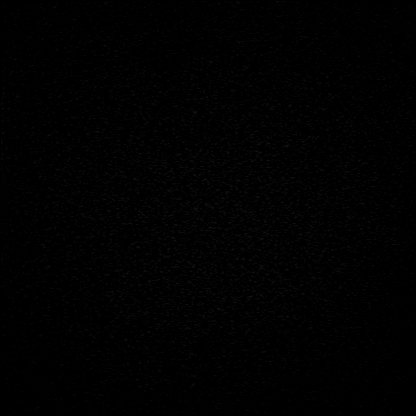

[Series 16: T1 · axial · 1.0mm · 0.98mm/px · z∈[-88,+86]mm · 11 of 176 slices shown (2 of 2)]
[im 1/176]
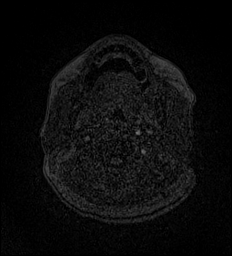
[im 15/176]
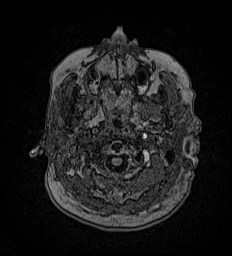
[im 30/176]
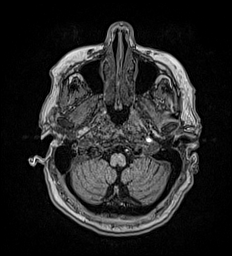
[im 44/176]
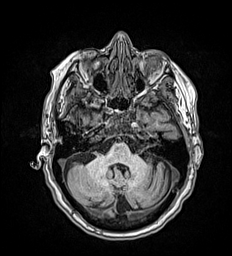
[im 59/176]
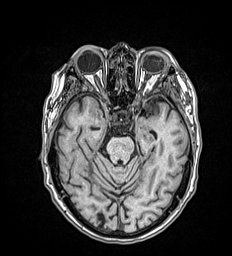
[im 73/176]
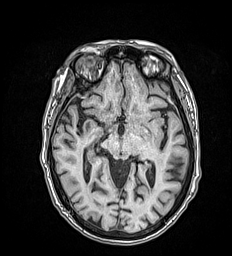
[im 88/176]
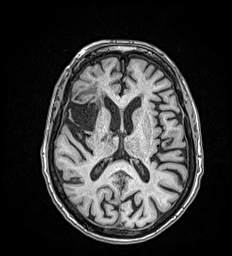
[im 103/176]
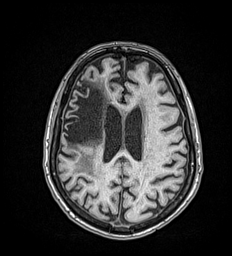
[im 117/176]
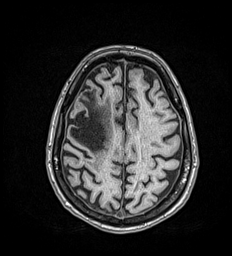
[im 146/176]
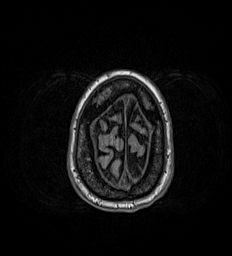
[im 176/176]
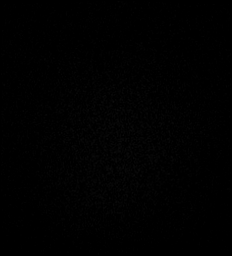

[Series 17: T2 · coronal · 5.0mm · 0.57mm/px · 2 of 29 slices shown (2 of 2)]
[im 1/29]
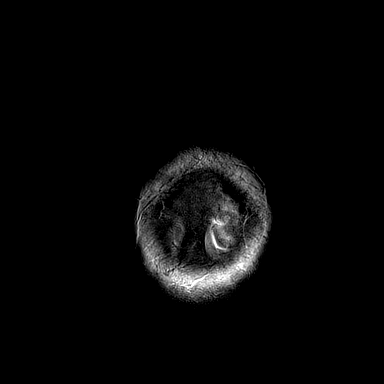
[im 29/29]
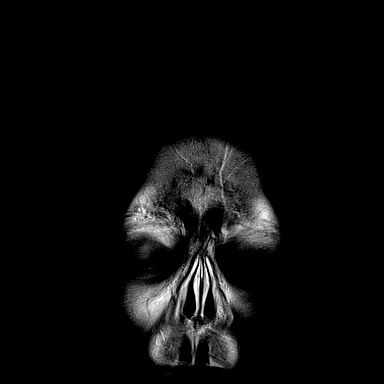

[46 of 48 positions shown; findings below may reference images not displayed]

FINDINGS: Brain: No acute infarction, hemorrhage, hydrocephalus, extra-axial
collection or mass lesion. Remote right anterior MCA territory
infarct with encephalomalacia and surrounding gliosis. Remote right
occipital cortical infarct. Remote lacunar infarcts in bilateral
basal ganglia and thalami. Additional scattered T2/FLAIR
hyperintensities in the white matter, likely related to chronic
microvascular ischemic disease.

Vascular: Major arterial flow voids are maintained skull base.

Skull and upper cervical spine: Normal marrow signal.

Sinuses/Orbits: Small left globe with small linear T2
hypointensities within the vitreous and incomplete FLAIR suppression
of the vitreous. Asymmetrically small left anterior chamber. Mild to
moderate scattered ethmoid air cell mucosal thickening.

Other: No sizable mastoid effusions.
IMPRESSION: 1. No evidence of acute intracranial abnormality. Specifically, no
acute infarct.
2. Remote right anterior MCA territory and right occipital infarcts.
3. Remote lacunar infarcts in bilateral basal ganglia and thalami.
4. Small, abnormal left globe. Correlate with ophthalmologic
history.

## 2021-04-27 MED ORDER — GABAPENTIN 300 MG PO CAPS
300.0000 mg | ORAL_CAPSULE | Freq: Three times a day (TID) | ORAL | Status: DC
  Administered 2021-04-27 – 2021-04-28 (×4): 300 mg via ORAL
  Filled 2021-04-27 (×4): qty 1

## 2021-04-27 MED ORDER — ASPIRIN EC 81 MG PO TBEC
81.0000 mg | DELAYED_RELEASE_TABLET | Freq: Every day | ORAL | Status: DC
  Administered 2021-04-28: 09:00:00 81 mg via ORAL
  Filled 2021-04-27: qty 1

## 2021-04-27 MED ORDER — ENOXAPARIN SODIUM 60 MG/0.6ML IJ SOSY
0.5000 mg/kg | PREFILLED_SYRINGE | INTRAMUSCULAR | Status: DC
  Administered 2021-04-27 – 2021-04-28 (×2): 45 mg via SUBCUTANEOUS
  Filled 2021-04-27 (×2): qty 0.6

## 2021-04-27 MED ORDER — ONDANSETRON HCL 4 MG PO TABS: 4.0000 mg | ORAL_TABLET | Freq: Four times a day (QID) | ORAL | Status: DC | PRN

## 2021-04-27 MED ORDER — PRAVASTATIN SODIUM 20 MG PO TABS
40.0000 mg | ORAL_TABLET | Freq: Every day | ORAL | Status: DC
  Administered 2021-04-27: 40 mg via ORAL
  Filled 2021-04-27: qty 2
  Filled 2021-04-27: qty 1

## 2021-04-27 MED ORDER — CALCIUM CARBONATE-VITAMIN D 500-200 MG-UNIT PO TABS
2.0000 | ORAL_TABLET | Freq: Every day | ORAL | Status: DC
  Administered 2021-04-27 – 2021-04-28 (×2): 2 via ORAL
  Filled 2021-04-27 (×2): qty 2

## 2021-04-27 MED ORDER — ONDANSETRON HCL 4 MG PO TABS
4.0000 mg | ORAL_TABLET | Freq: Four times a day (QID) | ORAL | Status: DC | PRN
Start: 2021-04-27 — End: 2021-04-28

## 2021-04-27 MED ORDER — PAROXETINE HCL 20 MG PO TABS
40.0000 mg | ORAL_TABLET | Freq: Every day | ORAL | Status: DC
  Administered 2021-04-27 – 2021-04-28 (×2): 40 mg via ORAL
  Filled 2021-04-27 (×2): qty 2

## 2021-04-27 MED ORDER — INSULIN ASPART 100 UNIT/ML IJ SOLN: 0.0000 [IU] | Freq: Three times a day (TID) | INTRAMUSCULAR | Status: DC

## 2021-04-27 MED ORDER — GLIMEPIRIDE 2 MG PO TABS
2.0000 mg | ORAL_TABLET | Freq: Every day | ORAL | Status: DC
  Administered 2021-04-27: 2 mg via ORAL
  Filled 2021-04-27 (×2): qty 1

## 2021-04-27 MED ORDER — OXYMETAZOLINE HCL 0.05 % NA SOLN
1.0000 | Freq: Two times a day (BID) | NASAL | Status: DC
  Administered 2021-04-27 – 2021-04-28 (×3): 1 via NASAL
  Filled 2021-04-27: qty 15

## 2021-04-27 MED ORDER — SODIUM CHLORIDE 0.9 % IV BOLUS
1000.0000 mL | Freq: Once | INTRAVENOUS | Status: AC
  Administered 2021-04-27: 1000 mL via INTRAVENOUS

## 2021-04-27 MED ORDER — LINAGLIPTIN 5 MG PO TABS
5.0000 mg | ORAL_TABLET | Freq: Every day | ORAL | Status: DC
  Administered 2021-04-27: 5 mg via ORAL
  Filled 2021-04-27 (×2): qty 1

## 2021-04-27 MED ORDER — MAGNESIUM HYDROXIDE 400 MG/5ML PO SUSP
30.0000 mL | Freq: Every day | ORAL | Status: DC | PRN
  Filled 2021-04-27: qty 30

## 2021-04-27 MED ORDER — INSULIN ASPART 100 UNIT/ML IJ SOLN: 0.0000 [IU] | Freq: Every day | INTRAMUSCULAR | Status: DC

## 2021-04-27 MED ORDER — INSULIN ASPART 100 UNIT/ML IJ SOLN: 0.0000 [IU] | INTRAMUSCULAR | Status: DC

## 2021-04-27 MED ORDER — ALBUTEROL SULFATE (2.5 MG/3ML) 0.083% IN NEBU: 3.0000 mL | INHALATION_SOLUTION | Freq: Four times a day (QID) | RESPIRATORY_TRACT | Status: DC | PRN

## 2021-04-27 MED ORDER — ASPIRIN 81 MG PO CHEW
324.0000 mg | CHEWABLE_TABLET | Freq: Once | ORAL | Status: AC
  Administered 2021-04-27: 324 mg via ORAL
  Filled 2021-04-27: qty 4

## 2021-04-27 MED ORDER — CLOPIDOGREL BISULFATE 75 MG PO TABS
75.0000 mg | ORAL_TABLET | Freq: Every day | ORAL | Status: DC
  Administered 2021-04-27 – 2021-04-28 (×2): 75 mg via ORAL
  Filled 2021-04-27 (×2): qty 1

## 2021-04-27 MED ORDER — ACETAMINOPHEN 325 MG PO TABS: 650.0000 mg | ORAL_TABLET | Freq: Four times a day (QID) | ORAL | Status: DC | PRN

## 2021-04-27 MED ORDER — CARVEDILOL 6.25 MG PO TABS
12.5000 mg | ORAL_TABLET | Freq: Two times a day (BID) | ORAL | Status: DC
  Administered 2021-04-27: 12.5 mg via ORAL
  Filled 2021-04-27: qty 2

## 2021-04-27 MED ORDER — ONDANSETRON HCL 4 MG/2ML IJ SOLN
4.0000 mg | Freq: Four times a day (QID) | INTRAMUSCULAR | Status: DC | PRN
Start: 2021-04-27 — End: 2021-04-28

## 2021-04-27 MED ORDER — LETROZOLE 2.5 MG PO TABS
2.5000 mg | ORAL_TABLET | Freq: Every day | ORAL | Status: DC
  Administered 2021-04-27 – 2021-04-28 (×2): 2.5 mg via ORAL
  Filled 2021-04-27 (×2): qty 1

## 2021-04-27 MED ORDER — POTASSIUM CHLORIDE CRYS ER 10 MEQ PO TBCR
10.0000 meq | EXTENDED_RELEASE_TABLET | Freq: Every day | ORAL | Status: DC
  Administered 2021-04-27: 10 meq via ORAL
  Filled 2021-04-27: qty 1

## 2021-04-27 MED ORDER — LORAZEPAM 2 MG/ML IJ SOLN
0.5000 mg | INTRAMUSCULAR | Status: DC | PRN
Start: 2021-04-27 — End: 2021-04-28
  Administered 2021-04-27: 0.5 mg via INTRAVENOUS
  Filled 2021-04-27: qty 1

## 2021-04-27 MED ORDER — STROKE: EARLY STAGES OF RECOVERY BOOK: Freq: Once | Status: DC

## 2021-04-27 MED ORDER — ACETAMINOPHEN 650 MG RE SUPP: 650.0000 mg | Freq: Four times a day (QID) | RECTAL | Status: DC | PRN

## 2021-04-27 MED ORDER — SODIUM CHLORIDE 0.9 % IV SOLN: INTRAVENOUS | Status: DC

## 2021-04-27 MED ORDER — ZOLPIDEM TARTRATE 5 MG PO TABS: 5.0000 mg | ORAL_TABLET | Freq: Every evening | ORAL | Status: DC | PRN

## 2021-04-27 NOTE — ED Provider Notes (Signed)
[facility] Emergency Department Provider Note   ____________________________________________   Event Date/Time   First MD Initiated Contact with Patient 04/27/21 [phone]     (approximate)  I have reviewed the triage vital signs and the nursing notes.   HISTORY  Chief Complaint Weakness    HPI Elizabeth [name] is a 70 y.o. female brought to the ED from home by her family with a chief complaint of difficulty speaking.  Patient has a history of stroke in 2000 on Plavix only with mild residual left-sided weakness.  Family states tonight around 9 PM she had difficulty finding her words.  Denies confusion, facial droop, extremity weakness.  Patient states the episode lasted approximately 30 minutes.  Family states she looks like she could not move during the episode.  Symptoms improved on the car ride over here and symptoms were resolved by the time she reached to the emergency department.  Last known well at 8:52 PM.  Had a mild global headache approximately 30 minutes prior to speech difficulty.  Recent right foot surgery for osteomyelitis.  Denies fever, chills, chest pain, shortness of breath, abdominal pain, nausea, vomiting or dizziness.     Past Medical History:  Diagnosis Date   Cancer [facility])    breast   Carcinoma of right breast ([place]) 05/20/2014   Diabetes mellitus without complication ([place])    Hypertension    Lymphedema    Personal history of radiation therapy 2015   Right breast   PONV (postoperative nausea and vomiting)    Psoriasis 1990   Stroke ([place]) 2000   residual left arm weakness   Varicose veins     Patient Active Problem List   Diagnosis Date Noted   TIA (transient ischemic attack) 04/27/2021   Diabetic polyneuropathy associated with type 2 diabetes mellitus ([place]) 04/07/2021   Cellulitis in diabetic foot ([place]) 03/03/2021   Sepsis ([place]) 05/10/2018   Pressure injury of skin 05/10/2018   Malignant neoplasm of female breast ([place])  02/02/2017   Osteomyelitis of right foot ([place]) 01/28/2017   Type 2 diabetes mellitus with peripheral vascular disease ([place]) 01/28/2017   Cellulitis 01/22/2017   Hypokalemia 01/22/2017   Morbid obesity ([place]) 09/23/2015   Biliary sludge 07/23/2015   Steatosis of liver 07/23/2015   Diabetes mellitus, type 2 ([place]) 05/21/2015   Peripheral vascular disease ([place]) 05/21/2015   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 ([place]) 05/21/2015   Chronic venous insufficiency 02/05/2015   Mixed incontinence 10/24/2014   Extreme obesity 10/24/2014   Hernia, rectovaginal 10/24/2014   Mixed stress and urge urinary incontinence 10/24/2014   Airway hyperreactivity 02/19/2013   Cerebral vascular accident ([place]) 02/19/2013   Clinical depression 02/19/2013   Diabetes mellitus type 2 in obese ([place]) 02/19/2013   Colon, diverticulosis 02/19/2013   Acid reflux 02/19/2013   HLD (hyperlipidemia) 02/19/2013   BP (high blood pressure) 02/19/2013    Past Surgical History:  Procedure Laterality Date   AMPUTATION Left 03/04/2021   Procedure: AMPUTATION RAY;  Surgeon: Sharlotte Alamo, DPM;  Location: [facility] ORS;  Service: Podiatry;  Laterality: Left;   AMPUTATION TOE Right 01/25/2017   Procedure: AMPUTATION TOE   ;  Surgeon: Albertine Patricia, DPM;  Location: [facility] ORS;  Service: Podiatry;  Laterality: Right;   BREAST EXCISIONAL BIOPSY Right 2015   [facility] / with radiation   BREAST LUMPECTOMY Right 04-2014   followed by radiation,  no chemo   BREAST REDUCTION SURGERY  1977   CHOLECYSTECTOMY N/A 09/23/2015  Procedure: LAPAROSCOPIC CHOLECYSTECTOMY;  Surgeon: Bonner Puna, MD;  Location: [facility] ORS;  Service: General;  Laterality: N/A;   DILATION AND CURETTAGE OF UTERUS  2010   EYE SURGERY Left 2015   cataracts and left eye detached retina repair   EYE SURGERY Right 2013   cataract with len implant   GASTRIC ROUX-EN-Y N/A 09/23/2015   Procedure: LAPAROSCOPIC ROUX-EN-Y GASTRIC;  Surgeon: Bonner Puna, MD;  Location: [facility]  ORS;  Service: General;  Laterality: N/A;   HIATAL HERNIA REPAIR N/A 09/23/2015   Procedure: LAPAROSCOPIC REPAIR OF HIATAL HERNIA;  Surgeon: Bonner Puna, MD;  Location: [facility] ORS;  Service: General;  Laterality: N/A;   IRRIGATION AND DEBRIDEMENT FOOT Right 01/25/2017   Procedure: IRRIGATION AND DEBRIDEMENT FOOT;  Surgeon: Albertine Patricia, DPM;  Location: [facility] ORS;  Service: Podiatry;  Laterality: Right;   IRRIGATION AND DEBRIDEMENT FOOT Right 01/29/2017   Procedure: IRRIGATION AND DEBRIDEMENT FOOT and application of wound vac;  Surgeon: Albertine Patricia, DPM;  Location: [facility] ORS;  Service: Podiatry;  Laterality: Right;   LOWER EXTREMITY ANGIOGRAPHY Right 01/28/2017   Procedure: Lower Extremity Angiography;  Surgeon: Katha Cabal, MD;  Location: [place] CV LAB;  Service: Cardiovascular;  Laterality: Right;   LOWER EXTREMITY ANGIOGRAPHY Left 03/06/2021   Procedure: Lower Extremity Angiography;  Surgeon: Katha Cabal, MD;  Location: [place] [facility];  Service: Cardiovascular;  Laterality: Left;   LOWER EXTREMITY INTERVENTION  01/28/2017   Procedure: Lower Extremity Intervention;  Surgeon: Katha Cabal, MD;  Location: [place] [facility];  Service: Cardiovascular;;   REDUCTION MAMMAPLASTY      Prior to Admission medications   Medication Sig Start Date End Date Taking? Authorizing Provider  acetaminophen (TYLENOL) 325 MG tablet Take 2 tablets (650 mg total) by mouth every 6 (six) hours as needed for mild pain (or Fever >/= 101). 03/07/21   Nicole Kindred A, DO  albuterol (PROVENTIL HFA;VENTOLIN HFA) 108 (90 BASE) MCG/ACT inhaler Inhale 2 puffs into the lungs every 6 (six) hours as needed for wheezing or shortness of breath.    [provider]  aspirin 81 MG EC tablet Bayer Low Dose Aspirin 81 mg tablet,delayed release  Take 1 tablet every day by oral route.    [provider]  calcium-vitamin D (OSCAL WITH D) 500-200 MG-UNIT tablet Take 2 tablets by mouth daily  with breakfast. 11/23/16   Lloyd Huger, MD  carvedilol (COREG) 12.5 MG tablet Take by mouth.    [provider]  clopidogrel (PLAVIX) 75 MG tablet Take 75 mg by mouth. 11/06/15   [provider]  DIFLUCAN 150 MG tablet Take by mouth. 03/17/21   [provider]  furosemide (LASIX) 20 MG tablet Take 20 mg by mouth daily. 01/08/21   [provider]  gabapentin (NEURONTIN) 300 MG capsule Take 300 mg by mouth 3 (three) times daily.     [provider]  glimepiride (AMARYL) 2 MG tablet Take by mouth.    [provider]  HYDROcodone-acetaminophen (NORCO/VICODIN) 5-325 MG tablet TAKE 1-2 TABLETS BY MOUTH EVERY 4 (FOUR) HOURS AS NEEDED FOR UP TO 7 DAYS FOR MODERATE PAIN. 03/07/21   [provider]  letrozole (FEMARA) 2.5 MG tablet Take 1 tablet (2.5 mg total) by mouth daily. 11/21/17   Lloyd Huger, MD  linagliptin (TRADJENTA) 5 MG TABS tablet Take 5 mg by mouth daily.    [provider]  metFORMIN (GLUCOPHAGE) 1000 MG tablet Take 1,000 mg by mouth  daily with breakfast.     [provider]  mupirocin ointment (BACTROBAN) 2 % Apply topically. 01/01/20   [provider]  ondansetron (ZOFRAN) 4 MG tablet Take 1 tablet (4 mg total) by mouth every 6 (six) hours as needed for nausea. 03/07/21   Ezekiel Slocumb, DO  PARoxetine (PAXIL) 40 MG tablet Take 40 mg by mouth daily.    [provider]  potassium chloride (MICRO-K) 10 MEQ CR capsule Take 10 mEq by mouth daily.  01/20/17   [provider]  pravastatin (PRAVACHOL) 40 MG tablet Take 40 mg by mouth daily. In afternoon    [provider]  triamcinolone ointment (KENALOG) 0.5 % Apply 1 application topically 2 (two) times daily. 04/07/21   Kris Hartmann, NP  TRUE METRIX BLOOD GLUCOSE TEST test strip  10/13/18   [provider]  TRUEPLUS LANCETS 33G [facility]  10/13/18   [provider]    Allergies Patient has no known  allergies.  Family History  Problem Relation Age of Onset   Diabetes Mother    Cancer Mother    Breast cancer Mother 42   Diabetes Father    Diabetes Sister    Diabetes Brother     Social History Social History   Tobacco Use   Smoking status: Never   Smokeless tobacco: Never  Substance Use Topics   Alcohol use: No    Alcohol/week: 0.0 standard drinks   Drug use: No    Review of Systems  Constitutional: No fever/chills Eyes: No visual changes. ENT: No sore throat. Cardiovascular: Denies chest pain. Respiratory: Denies shortness of breath. Gastrointestinal: No abdominal pain.  No nausea, no vomiting.  No diarrhea.  No constipation. Genitourinary: Negative for dysuria. Musculoskeletal: Negative for back pain. Skin: Negative for rash. Neurological: Positive for headache and speech difficulty.  Focal weakness or numbness.   ____________________________________________   PHYSICAL EXAM:  VITAL SIGNS: ED Triage Vitals [04/26/21 2222]  Enc Vitals Group     BP (!) 165/85     Pulse Rate 66     Resp 20     Temp 98.4 F (36.9 C)     Temp Source Oral     SpO2 97 %     Weight 200 lb (90.7 kg)     Height 5\' 3"  (1.6 m)     Head Circumference      Peak Flow      Pain Score 0     Pain Loc      Pain Edu?      Excl. in [place]?     Constitutional: Alert and oriented.  Elderly appearing and in no acute distress. Eyes: Conjunctivae are normal. PERRL. EOMI. Head: Atraumatic. Nose: No congestion/rhinnorhea. Mouth/Throat: Mucous membranes are mildly dry. Neck: No stridor.   Cardiovascular: Normal rate, regular rhythm. Grossly normal heart sounds.  Good peripheral circulation. Respiratory: Normal respiratory effort.  No retractions. Lungs CTAB. Gastrointestinal: Soft and nontender. No distention. No abdominal bruits. No CVA tenderness. Musculoskeletal: Right foot in boot.  No lower extremity tenderness nor edema.  No joint effusions. Neurologic: Alert and oriented x3.  Normal  speech and language. No gross focal neurologic deficits are appreciated.  Skin:  Skin is warm, dry and intact. No rash noted. Psychiatric: Mood and affect are normal. Speech and behavior are normal.  ____________________________________________   LABS (all labs ordered are listed, but only abnormal results are displayed)  Labs Reviewed  CBC - Abnormal; Notable for the following components:  Result Value   RBC 3.80 (*)    Hemoglobin 10.4 (*)    HCT 33.2 (*)    All other components within normal limits  COMPREHENSIVE METABOLIC PANEL - Abnormal; Notable for the following components:   Glucose, Bld 114 (*)    Creatinine, Ser 1.42 (*)    Calcium 8.6 (*)    Albumin 3.4 (*)    AST 13 (*)    GFR, Estimated 40 (*)    Anion gap 3 (*)    All other components within normal limits  CBG MONITORING, ED - Abnormal; Notable for the following components:   Glucose-Capillary 111 (*)    All other components within normal limits  PROTIME-INR  APTT  DIFFERENTIAL  LACTIC ACID, PLASMA  URINALYSIS, COMPLETE (UACMP) WITH MICROSCOPIC  CBG MONITORING, ED  TROPONIN I (HIGH SENSITIVITY)   ____________________________________________  EKG  ED ECG REPORT I, Lakina Mcintire J, the attending physician, personally viewed and interpreted this ECG.   Date: 04/27/2021  EKG Time: 2224  Rate: 66  Rhythm: normal EKG, normal sinus rhythm  Axis: Normal  Intervals:none  ST&T Change: Nonspecific  ____________________________________________  RADIOLOGY I, Steffen Hase J, personally viewed and evaluated these images (plain radiographs) as part of my medical decision making, as well as reviewing the written report by the radiologist.  ED MD interpretation: No ICH  Official radiology report(s): CT HEAD WO CONTRAST  Result Date: 04/26/2021 CLINICAL DATA:  Word-finding difficulty EXAM: CT HEAD WITHOUT CONTRAST TECHNIQUE: Contiguous axial images were obtained from the base of the skull through the vertex without  intravenous contrast. COMPARISON:  None. 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 No hemorrhage or other acute abnormality. Right occipital chronic infarct. Vascular: Atherosclerotic calcification of the vertebral and internal carotid arteries at the skull base. No abnormal hyperdensity of the major intracranial arteries or dural venous sinuses. Skull: The visualized skull base, calvarium and extracranial soft tissues are normal. Sinuses/Orbits: No fluid levels or advanced mucosal thickening of the visualized paranasal sinuses. No mastoid or middle ear effusion. The orbits are normal. IMPRESSION: 1. No acute intracranial abnormality. 2. 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 Electronically Signed   By: Ulyses Jarred M.D.   On: 04/26/2021 22:55    ____________________________________________   PROCEDURES  Procedure(s) performed (including Critical Care):  .1-3 Lead EKG Interpretation  Date/Time: 04/27/2021 3:56 AM Performed by: Paulette Blanch, MD Authorized by: Paulette Blanch, MD     Interpretation: normal     ECG rate:  66   ECG rate assessment: normal     Rhythm: sinus rhythm     Ectopy: none     Conduction: normal   Comments:     Patient placed on cardiac monitor to evaluate for arrhythmias  NIH Stroke Scale  Interval: Baseline Time: 4:14 AM Person Administering Scale: Rhodes Calvert J  Administer stroke scale items in the order listed. Record performance in each category after each subscale exam. Do not go back and change scores. Follow directions provided for each exam technique. Scores should reflect what the patient does, not what the clinician thinks the patient can do. The clinician should record answers while administering the exam and work quickly. Except where indicated, the patient should not be coached (i.e., repeated requests to patient to make a special effort).   1a  Level of consciousness: 0=alert; keenly responsive  1b. LOC questions:  0=Performs both  tasks correctly  1c. LOC commands: 0=Performs both tasks correctly  2.  Best Gaze: 0=normal  3.  Visual: 0=No visual loss  4. Facial Palsy: 0=Normal symmetric movement  5a.  Motor left arm: 0=No drift, limb holds 90 (or 45) degrees for full 10 seconds  5b.  Motor right arm: 0=No drift, limb holds 90 (or 45) degrees for full 10 seconds  6a. motor left leg: 0=No drift, limb holds 90 (or 45) degrees for full 10 seconds  6b  Motor right leg:  0=No drift, limb holds 90 (or 45) degrees for full 10 seconds  7. Limb Ataxia: 0=Absent  8.  Sensory: 0=Normal; no sensory loss  9. Best Language:  0=No aphasia, normal  10. Dysarthria: 0=Normal  11. Extinction and Inattention: 0=No abnormality  12. Distal motor function: 0=Normal   Total:   0      ____________________________________________   INITIAL IMPRESSION / ASSESSMENT AND PLAN / ED COURSE  As part of my medical decision making, I reviewed the following data within the [place] History obtained from family, Nursing notes reviewed and incorporated, Labs reviewed, EKG interpreted, Old chart reviewed, Radiograph reviewed, Discussed with admitting physician, and Notes from prior ED visits     70 year old female presenting with speech difficulty x30 minutes, now resolved.  History of CVA on Plavix only.  Differential diagnosis includes but is not limited to TIA, CVA, ICH, metabolic, infectious etiologies, etc.  Laboratory results demonstrate AKI, no ICH on CT head.  Will obtain swallow screen and if able, will administer full-strength aspirin.  Will discuss with hospitalist services for admission.      ____________________________________________   FINAL CLINICAL IMPRESSION(S) / ED DIAGNOSES  Final diagnoses:  TIA (transient ischemic attack)  Dehydration  AKI (acute kidney injury) [facility])     ED Discharge Orders     None        Note:  This document was prepared using Dragon voice recognition software and may  include unintentional dictation errors.    Paulette Blanch, MD 04/27/21 [phone]

## 2021-04-27 NOTE — ED Notes (Signed)
Pt transported to MRI at this time 

## 2021-04-27 NOTE — Consult Note (Signed)
[facility] Consultation Note  Patient ID: Elizabeth [name], MRN: [id number], DOB/AGE: 70-03-1951 70 y.o. Admit date: 04/27/2021   Date of Consult: 04/27/2021 Primary Physician: Marguerita Merles, MD Primary Cardiologist: None  Chief Complaint:  Chief Complaint  Patient presents with   Weakness   Reason for Consult:  Bradycardia pauses  HPI: 70 y.o. female with known peripheral vascular disease status post with r remote stroke with residual left-sided weakness and difficulty with speech having diabetes hypertension hyperlipidemia anemia chronic kidney disease with a glomerular filtration rate of 52 admitted to the hospital for concerns of TIA.  Apparently the patient has a had full resolution of her neurologic symptoms and no evidence of new stroke.  Upon further evaluation the patient has an EKG showing normal sinus rhythm otherwise normal EKG.  Troponin level was 5 hemoglobin was 10.2.  The patient has had monitoring by telemetry which has shown some free atrial contractions and nonconducted PACs with pauses which appear not to be significant at this time and no current evidence of advanced heart block.  The patient does not have any symptoms of dizziness syncope chest pain shortness of breath at this time and is hemodynamically stable  Past Medical History:  Diagnosis Date   Cancer [facility])    breast   Carcinoma of right breast ([place]) 05/20/2014   Diabetes mellitus without complication ([place])    Hypertension    Lymphedema    Personal history of radiation therapy 2015   Right breast   PONV (postoperative nausea and vomiting)    Psoriasis 1990   Stroke ([place]) 2000   residual left arm weakness   Varicose veins       Surgical History:  Past Surgical History:  Procedure Laterality Date   AMPUTATION Left 03/04/2021   Procedure: AMPUTATION RAY;  Surgeon: Sharlotte Alamo, DPM;  Location: [facility] ORS;  Service: Podiatry;  Laterality: Left;   AMPUTATION TOE Right 01/25/2017   Procedure:  AMPUTATION TOE   ;  Surgeon: Albertine Patricia, DPM;  Location: [facility] ORS;  Service: Podiatry;  Laterality: Right;   BREAST EXCISIONAL BIOPSY Right 2015   [facility] / with radiation   BREAST LUMPECTOMY Right 04-2014   followed by radiation,  no chemo   BREAST REDUCTION SURGERY  1977   CHOLECYSTECTOMY N/A 09/23/2015   Procedure: LAPAROSCOPIC CHOLECYSTECTOMY;  Surgeon: Bonner Puna, MD;  Location: [facility] ORS;  Service: General;  Laterality: N/A;   DILATION AND CURETTAGE OF UTERUS  2010   EYE SURGERY Left 2015   cataracts and left eye detached retina repair   EYE SURGERY Right 2013   cataract with len implant   GASTRIC ROUX-EN-Y N/A 09/23/2015   Procedure: LAPAROSCOPIC ROUX-EN-Y GASTRIC;  Surgeon: Bonner Puna, MD;  Location: [facility] ORS;  Service: General;  Laterality: N/A;   HIATAL HERNIA REPAIR N/A 09/23/2015   Procedure: LAPAROSCOPIC REPAIR OF HIATAL HERNIA;  Surgeon: Bonner Puna, MD;  Location: [facility] ORS;  Service: General;  Laterality: N/A;   IRRIGATION AND DEBRIDEMENT FOOT Right 01/25/2017   Procedure: IRRIGATION AND DEBRIDEMENT FOOT;  Surgeon: Albertine Patricia, DPM;  Location: [facility] ORS;  Service: Podiatry;  Laterality: Right;   IRRIGATION AND DEBRIDEMENT FOOT Right 01/29/2017   Procedure: IRRIGATION AND DEBRIDEMENT FOOT and application of wound vac;  Surgeon: Albertine Patricia, DPM;  Location: [facility] ORS;  Service: Podiatry;  Laterality: Right;   LOWER EXTREMITY ANGIOGRAPHY Right 01/28/2017   Procedure: Lower Extremity Angiography;  Surgeon: Katha Cabal, MD;  Location: [facility] INVASIVE CV  LAB;  Service: Cardiovascular;  Laterality: Right;   LOWER EXTREMITY ANGIOGRAPHY Left 03/06/2021   Procedure: Lower Extremity Angiography;  Surgeon: Katha Cabal, MD;  Location: [place] CV LAB;  Service: Cardiovascular;  Laterality: Left;   LOWER EXTREMITY INTERVENTION  01/28/2017   Procedure: Lower Extremity Intervention;  Surgeon: Katha Cabal, MD;  Location: [place] CV LAB;  Service: Cardiovascular;;    REDUCTION MAMMAPLASTY       Home Meds: Prior to Admission medications   Medication Sig Start Date End Date Taking? Authorizing Provider  aspirin 81 MG EC tablet Take 81 mg by mouth daily.   Yes [provider]  carvedilol (COREG) 12.5 MG tablet Take 12.5 mg by mouth 2 (two) times daily with a meal.   Yes [provider]  clopidogrel (PLAVIX) 75 MG tablet Take 75 mg by mouth daily. 11/06/15  Yes [provider]  furosemide (LASIX) 20 MG tablet Take 20 mg by mouth daily. 01/08/21  Yes [provider]  gabapentin (NEURONTIN) 300 MG capsule Take 300 mg by mouth 3 (three) times daily.    Yes [provider]  glimepiride (AMARYL) 2 MG tablet Take 2 mg by mouth daily with breakfast.   Yes [provider]  letrozole (FEMARA) 2.5 MG tablet Take 1 tablet (2.5 mg total) by mouth daily. 11/21/17  Yes Lloyd Huger, MD  linagliptin (TRADJENTA) 5 MG TABS tablet Take 5 mg by mouth daily.   Yes [provider]  lisinopril (ZESTRIL) 20 MG tablet Take 20 mg by mouth daily. 04/15/21  Yes [provider]  metFORMIN (GLUCOPHAGE) 1000 MG tablet Take 1,000 mg by mouth daily with breakfast.    Yes [provider]  PARoxetine (PAXIL) 40 MG tablet Take 40 mg by mouth daily.   Yes [provider]  potassium chloride (KLOR-CON) 10 MEQ tablet Take 10 mEq by mouth daily. 04/15/21  Yes [provider]  pravastatin (PRAVACHOL) 40 MG tablet Take 40 mg by mouth daily. In afternoon   Yes [provider]  acetaminophen (TYLENOL) 325 MG tablet Take 2 tablets (650 mg total) by mouth every 6 (six) hours as needed for mild pain (or Fever >/= 101). 03/07/21   Nicole Kindred A, DO  albuterol (PROVENTIL HFA;VENTOLIN HFA) 108 (90 BASE) MCG/ACT inhaler Inhale 2 puffs into the lungs every 6 (six) hours as needed for wheezing or shortness of breath.    [provider]  triamcinolone ointment (KENALOG) 0.5 % Apply 1  application topically 2 (two) times daily. 04/07/21   Kris Hartmann, NP  TRUE METRIX BLOOD GLUCOSE TEST test strip  10/13/18   [provider]  TRUEPLUS LANCETS 33G MISC  10/13/18   [provider]    Inpatient Medications:    stroke: mapping our early stages of recovery book   Does not apply Once   [START ON 04/28/2021] aspirin EC  81 mg Oral Daily   calcium-vitamin D  2 tablet Oral Q breakfast   clopidogrel  75 mg Oral Daily   enoxaparin (LOVENOX) injection  0.5 mg/kg Subcutaneous Q24H   gabapentin  300 mg Oral TID   glimepiride  2 mg Oral Q breakfast   [START ON 04/28/2021] insulin aspart  0-15 Units Subcutaneous TID WC   insulin aspart  0-5 Units Subcutaneous QHS   letrozole  2.5 mg Oral Daily   linagliptin  5 mg Oral Daily   oxymetazoline  1 spray Each Nare BID   PARoxetine  40 mg  Oral Daily   potassium chloride  10 mEq Oral Daily   pravastatin  40 mg Oral q1800    sodium chloride Stopped (04/27/21 0944)    Allergies: No Known Allergies  Social History   Socioeconomic History   Marital status: Married    Spouse name: Not on file   Number of children: Not on file   Years of education: Not on file   Highest education level: Not on file  Occupational History   Not on file  Tobacco Use   Smoking status: Never   Smokeless tobacco: Never  Substance and Sexual Activity   Alcohol use: No    Alcohol/week: 0.0 standard drinks   Drug use: No   Sexual activity: Yes    Birth control/protection: Post-menopausal  Other Topics Concern   Not on file  Social History Narrative   Not on file   Social Determinants of Health   Financial Resource Strain: Not on file  Food Insecurity: Not on file  Transportation Needs: Not on file  Physical Activity: Not on file  Stress: Not on file  Social Connections: Not on file  Intimate Partner Violence: Not on file     Family History  Problem Relation Age of Onset   Diabetes Mother    Cancer Mother    Breast cancer  Mother 65   Diabetes Father    Diabetes Sister    Diabetes Brother      Review of Systems Positive for none Negative for: General:  chills, fever, night sweats or weight changes.  Cardiovascular: PND orthopnea syncope dizziness  Dermatological skin lesions rashes Respiratory: Cough congestion Urologic: Frequent urination urination at night and hematuria Abdominal: negative for nausea, vomiting, diarrhea, bright red blood per rectum, melena, or hematemesis Neurologic: negative for visual changes, and/or hearing changes  All other systems reviewed and are otherwise negative except as noted above.  Labs: No results for input(s): CKTOTAL, CKMB, TROPONINI in the last 72 hours. Lab Results  Component Value Date   WBC 8.9 04/27/2021   HGB 10.1 (L) 04/27/2021   HCT 31.8 (L) 04/27/2021   MCV 85.7 04/27/2021   PLT 263 04/27/2021    Recent Labs  Lab 04/26/21 2227 04/27/21 0555  NA 140 141  K 4.5 4.1  CL 105 105  CO2 32 30  BUN 22 23  CREATININE 1.42* 1.14*  CALCIUM 8.6* 8.3*  PROT 6.6  --   BILITOT 0.5  --   ALKPHOS 74  --   ALT 10  --   AST 13*  --   GLUCOSE 114* 102*   Lab Results  Component Value Date   CHOL 132 04/27/2021   HDL 65 04/27/2021   LDLCALC 57 04/27/2021   TRIG 52 04/27/2021   No results found for: DDIMER  Radiology/Studies:  CT HEAD WO CONTRAST  Result Date: 04/26/2021 CLINICAL DATA:  Word-finding difficulty EXAM: CT HEAD WITHOUT CONTRAST TECHNIQUE: Contiguous axial images were obtained from the base of the skull through the vertex without intravenous contrast. COMPARISON:  None. 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 No hemorrhage or other acute abnormality. Right occipital chronic infarct. Vascular: Atherosclerotic calcification of the vertebral and internal carotid arteries at the skull base. No abnormal hyperdensity of the major intracranial arteries or dural venous sinuses. Skull: The visualized skull base, calvarium and  extracranial soft tissues are normal. Sinuses/Orbits: No fluid levels or advanced mucosal thickening of the visualized paranasal sinuses. No mastoid or middle ear effusion. The orbits are normal. IMPRESSION:  1. No acute intracranial abnormality. 2. 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 Electronically Signed   By: Ulyses Jarred M.D.   On: 04/26/2021 22:55   MR BRAIN WO CONTRAST  Result Date: 04/27/2021 CLINICAL DATA:  Transient ischemic attack. EXAM: MRI HEAD WITHOUT CONTRAST TECHNIQUE: Multiplanar, multiecho pulse sequences of the brain and surrounding structures were obtained without intravenous contrast. COMPARISON:  CT head April 26, 2021. FINDINGS: Brain: No acute infarction, hemorrhage, hydrocephalus, extra-axial collection or mass lesion. Remote right anterior MCA territory infarct with encephalomalacia and surrounding gliosis. Remote right occipital cortical infarct. Remote lacunar infarcts in bilateral basal ganglia and thalami. Additional scattered T2/FLAIR hyperintensities in the white matter, likely related to chronic microvascular ischemic disease. Vascular: Major arterial flow voids are maintained skull base. Skull and upper cervical spine: Normal marrow signal. Sinuses/Orbits: Small left globe with small linear T2 hypointensities within the vitreous and incomplete FLAIR suppression of the vitreous. Asymmetrically small left anterior chamber. Mild to moderate scattered ethmoid air cell mucosal thickening. Other: No sizable mastoid effusions. IMPRESSION: 1. No evidence of acute intracranial abnormality. Specifically, no acute infarct. 2. Remote right anterior MCA territory and right occipital infarcts. 3. Remote lacunar infarcts in bilateral basal ganglia and thalami. 4. Small, abnormal left globe. Correlate with ophthalmologic history. Electronically Signed   By: Margaretha Sheffield MD   On: 04/27/2021 10:55   US Carotid Bilateral (at [facility] and [facility] only)  Result Date: 04/27/2021 CLINICAL DATA:   70 year old female with word finding difficulty. EXAM: BILATERAL CAROTID DUPLEX ULTRASOUND TECHNIQUE: Pearline Cables scale imaging, color Doppler and duplex ultrasound were performed of bilateral carotid and vertebral arteries in the neck. COMPARISON:  Head CT earlier today. FINDINGS: Criteria: Quantification of carotid stenosis is based on velocity parameters that correlate the residual internal carotid diameter with NASCET-based stenosis levels, using the diameter of the distal internal carotid lumen as the denominator for stenosis measurement. The following velocity measurements were obtained: RIGHT ICA: 79 cm/sec CCA: 87 cm/sec SYSTOLIC ICA/CCA RATIO:  0.9 ECA: 76 cm/sec LEFT ICA: 73 cm/sec CCA: 71 cm/sec SYSTOLIC ICA/CCA RATIO:  1.0 ECA: 113 cm/sec RIGHT CAROTID ARTERY: Mild to moderate atherosclerosis evident at the bulb (image 5). RIGHT VERTEBRAL ARTERY:  Patent with antegrade flow. LEFT CAROTID ARTERY: Minimal to mild atherosclerosis evident at the bifurcation (image 50), also mild atherosclerosis at the distal bulb. LEFT VERTEBRAL ARTERY:  Patent with antegrade flow. IMPRESSION: Right greater than left carotid bifurcation atherosclerosis with less than 50% stenosis. Electronically Signed   By: Genevie Ann M.D.   On: 04/27/2021 06:48   VAS [place] ABI WITH/WO TBI  Result Date: 04/13/2021  LOWER EXTREMITY DOPPLER STUDY Patient Name:  Elizabeth [name]  Date of Exam:   04/07/2021 Medical Rec #: [id number]         Accession #:    [id number] Date of Birth: 09/13/1951        Patient Gender: F Patient Age:   1Y Exam Location:  [facility] Procedure:      VAS [place] ABI WITH/WO TBI Referring Phys: [id number] [place] --------------------------------------------------------------------------------  Indications: Ulceration, and S/P Angiogram w/o Intervention.  Vascular Interventions: 03/06/2021: Abdominal Aortogram. Left Lower Extremity                         distal runoff third order catheterplacement. Performing  Technologist: Almira Coaster RVS  Examination Guidelines: A complete evaluation includes at minimum, Doppler waveform signals and systolic blood pressure reading at the level of  bilateral brachial, anterior tibial, and posterior tibial arteries, when vessel segments are accessible. Bilateral testing is considered an integral part of a complete examination. Photoelectric Plethysmograph (PPG) waveforms and toe systolic pressure readings are included as required and additional duplex testing as needed. Limited examinations for reoccurring indications may be performed as noted.  ABI Findings: +---------+------------------+-----+--------+--------+ Right    Rt Pressure (mmHg)IndexWaveformComment  +---------+------------------+-----+--------+--------+ Brachial 188                                     +---------+------------------+-----+--------+--------+ ATA      250               1.33 biphasicNC       +---------+------------------+-----+--------+--------+ PTA      172               0.91 biphasic         +---------+------------------+-----+--------+--------+ [place] Patter               0.71 Normal           +---------+------------------+-----+--------+--------+ +---------+------------------+-----+----------+-------+ Left     Lt Pressure (mmHg)IndexWaveform  Comment +---------+------------------+-----+----------+-------+ Brachial                                          +---------+------------------+-----+----------+-------+ ATA      233               1.24 monophasicNC      +---------+------------------+-----+----------+-------+ PTA      191               1.02 monophasic        +---------+------------------+-----+----------+-------+ Great Toe148               0.79 Normal            +---------+------------------+-----+----------+-------+ +-------+-----------+-----------+------------+------------+ ABI/TBIToday's ABIToday's TBIPrevious ABIPrevious TBI  +-------+-----------+-----------+------------+------------+ Right  >1.0 [place]    .71                                 +-------+-----------+-----------+------------+------------+ Left   >1.0 [place]    .79                                 +-------+-----------+-----------+------------+------------+  Summary: Right: Resting right ankle-brachial index indicates noncompressible right lower extremity arteries. The right toe-brachial index is normal. Left: Resting left ankle-brachial index indicates noncompressible left lower extremity arteries. The left toe-brachial index is normal.  *See table(s) above for measurements and observations.  Electronically signed by Hortencia Pilar MD on 04/13/2021 at 2:53:21 PM.    Final     EKG: Normal sinus rhythm otherwise normal EKG  Weights: Filed Weights   04/26/21 2222  Weight: 90.7 kg     Physical Exam: Blood pressure (!) 167/75, pulse 69, temperature 98.4 F (36.9 C), resp. rate 17, height 5\' 3"  (1.6 m), weight 90.7 kg, SpO2 96 %. Body mass index is 35.43 kg/m. General: Well developed, well nourished, in no acute distress. Head eyes ears nose throat: Normocephalic, atraumatic, sclera non-icteric, no xanthomas, nares are without discharge. No apparent thyromegaly and/or mass  Lungs: Normal respiratory effort.  no wheezes, no rales, no rhonchi.  Heart: RRR with normal S1 S2.  no murmur gallop, no rub, PMI is normal size and placement, carotid upstroke normal without bruit, jugular venous pressure is normal Abdomen: Soft, non-tender, non-distended with normoactive bowel sounds. No hepatomegaly. No rebound/guarding. No obvious abdominal masses. Abdominal aorta is normal size without bruit Extremities: No edema. no cyanosis, no clubbing, no ulcers  Peripheral : 2+ bilateral upper extremity pulses, 2+ bilateral femoral pulses, 2+ bilateral dorsal pedal pulse Neuro: Alert and oriented.  Left-sided facial asymmetry.   Moves all extremities  spontaneously. Musculoskeletal: Normal muscle tone without kyphosis Psych:  Responds to questions appropriately with a normal affect.    Assessment: 70 year old female with diabetes hypertension hyperlipidemia chronic kidney disease stage III and anemia with symptoms possibly consistent with worsening cerebrovascular abnormalities and no current evidence of congestive heart failure myocardial infarction at this time.  Telemetry shows nonconducted PACs but no evidence of advanced heart block at this time  Plan: 1.  Continue supportive care for cerebrovascular concerns 2.  Antihypertensive medication management as needed for goal systolic blood pressure below 130 mm although would avoid beta-blocker at this time due to concerns of bradycardia and/or heart block 3.  High intensity cholesterol therapy for peripheral vascular disease 4.  Antiplatelet therapy for peripheral vascular disease 5.  No further cardiac diagnostics necessary at this time 6.  If ambulating well with no other further significant symptoms okay for discharge home from cardiac standpoint  Signed, Corey Skains M.D. [facility] 04/27/2021, 7:58 PM

## 2021-04-27 NOTE — Progress Notes (Signed)
*  PRELIMINARY RESULTS* Echocardiogram 2D Echocardiogram has been performed.  Sherrie Sport 04/27/2021, 9:19 AM

## 2021-04-27 NOTE — Progress Notes (Signed)
PT Cancellation Note  Patient Details Name: Elizabeth [name] MRN: [id number] DOB: Jun 07, 1951   Cancelled Treatment:    Reason Eval/Treat Not Completed:  (Evaluation re-attempted. Per primary RN, patient with residual lethargy due to ativan (for MRI) and preparing for transfer to floor. RN advises hold at this time.  Will re-attempt at later time/date as appropriate.)  [name] H. Owens Shark, PT, DPT, NCS 04/27/21, 1:57 PM [phone]

## 2021-04-27 NOTE — Progress Notes (Signed)
SLP Cancellation Note  Patient Details Name: Elizabeth [name] MRN: [id number] DOB: [birth year]   Cancelled treatment:       Reason Eval/Treat Not Completed: SLP screened, no needs identified, will sign off (chart reviewed; consulted NSG then met w/ pt) Pt denied any difficulty swallowing this morning at her breakfast meal and is currently on a regular diet; tolerates swallowing pills w/ water per NSG. Pt conversed w/ this SLP answering general bio-personal questions re: self, family, and pets w/out overt deficits noted; pt's speech was slightly mumbled, but when she spoke up w/ effort, all speech was clear w/ good volume. Pt followed basic instructions. NOted drowsiness during engagement. NSG reported no overt deficits -- just drowsy after having Ativan for the MRI.  MRI revealed: "No evidence of acute intracranial abnormality. Specifically, No acute infarct. 2. Remote right anterior MCA territory and right occipital infarcts. 3. Remote lacunar infarcts in bilateral basal ganglia and thalami.".  No further skilled ST services indicated as pt appears at her baseline. If any deficits noted by pt once returning home that impact her ADLs, then recommend f/u w/ PCP for referral to f/u w/ outpatient ST services then. Pt agreed. NSG to reconsult if any change in status while admitted.       Orinda Kenner, MS, CCC-SLP Speech Language Pathologist Rehab Services [phone] [facility] 04/27/2021, 1:22 PM

## 2021-04-27 NOTE — Progress Notes (Addendum)
OT Cancellation Note  Patient Details Name: Elizabeth [name] MRN: [id number] DOB: 1950/11/25   Cancelled Treatment:    Reason Eval/Treat Not Completed: Patient at procedure or test/ unavailable. Consult received, chart reviewed. Upon attempt, therapist began speaking with pt when staff came to take pt to MRI. Will re-attempt OT evaluation at later time as pt is available and medically appropriate.   Addendum, 1:43pm: SLP leaving pt's room reporting pt very fatigued 2/2 recent Ativan administration in preparation for MRI. Pt too lethargic to safely participate in therapy. Will re-attempt next date.   Hanley Hays, MPH, MS, OTR/L ascom [phone] 04/27/21, 10:01 AM

## 2021-04-27 NOTE — ED Notes (Addendum)
Pt given phone to call MRI. Pt stating needs medication for clastrophobia. MD messaged

## 2021-04-27 NOTE — H&P (Signed)
[place]   PATIENT NAME: Elizabeth [name]    MR#:  [id number]  DATE OF BIRTH:  [birth year]  DATE OF ADMISSION:  04/27/2021  PRIMARY CARE PHYSICIAN: Marguerita Merles, MD   Patient is coming from: Home  REQUESTING/REFERRING PHYSICIAN: Lurline Hare, MD  CHIEF COMPLAINT:   Chief Complaint  Patient presents with   Weakness    HISTORY OF PRESENT ILLNESS:  Elizabeth [name] is a 70 y.o. Caucasian female with medical history significant for hypertension, type 2 diabetes mellitus, CVA with residual left-sided weakness and psoriasis, who presented to the emergency room with an onset of expressive dysphasia that lasted about 30 minutes earlier this evening around 9 PM with associated headache without paresthesias or focal muscle weakness.  No tinnitus or vertigo.  She admits to right sided back pain without dysuria, oliguria or hematuria or flank pain.  No cough or wheezing hemoptysis.  No other bleeding diathesis.  The patient had a recent amputation of the left fifth toe.  ED Course: The ER blood pressure was 165/85 with otherwise normal vital signs labs revealed a BUN of 22 and creatinine 1.42 compared to 21/1.11 on 03/07/2021.  Albumin was 3.4 with total protein of 6.6.  High-sensitivity troponin I was 5 and lactic acid 0.7.  CBC showed mild anemia better than previous levels she had normal coagulation profile. EKG as reviewed by me : showed normal sinus rhythm with a rate of 66 with poor R wave progression. Imaging: Noncontrasted CT scan revealed old anterior right MCA territory infarction with no acute intracranial abnormalities.  The patient was given 40 baby aspirin and 1 L bolus of IV normal saline.  She will be admitted to an observation medical monitored bed for further evaluation and management. PAST MEDICAL HISTORY:   Past Medical History:  Diagnosis Date   [facility]    breast   Carcinoma of right breast ([place]) 05/20/2014   Diabetes mellitus without complication ([place])     Hypertension    Lymphedema    Personal history of radiation therapy 2015   Right breast   PONV (postoperative nausea and vomiting)    Psoriasis 1990   Stroke ([place]) 2000   residual left arm weakness   Varicose veins     PAST SURGICAL HISTORY:   Past Surgical History:  Procedure Laterality Date   AMPUTATION Left 03/04/2021   Procedure: AMPUTATION RAY;  Surgeon: Sharlotte Alamo, DPM;  Location: [facility] ORS;  Service: Podiatry;  Laterality: Left;   AMPUTATION TOE Right 01/25/2017   Procedure: AMPUTATION TOE   ;  Surgeon: Albertine Patricia, DPM;  Location: [facility] ORS;  Service: Podiatry;  Laterality: Right;   BREAST EXCISIONAL BIOPSY Right 2015   [facility] / with radiation   BREAST LUMPECTOMY Right 04-2014   followed by radiation,  no chemo   BREAST REDUCTION SURGERY  1977   CHOLECYSTECTOMY N/A 09/23/2015   Procedure: LAPAROSCOPIC CHOLECYSTECTOMY;  Surgeon: Bonner Puna, MD;  Location: [facility] ORS;  Service: General;  Laterality: N/A;   DILATION AND CURETTAGE OF UTERUS  2010   EYE SURGERY Left 2015   cataracts and left eye detached retina repair   EYE SURGERY Right 2013   cataract with len implant   GASTRIC ROUX-EN-Y N/A 09/23/2015   Procedure: LAPAROSCOPIC ROUX-EN-Y GASTRIC;  Surgeon: Bonner Puna, MD;  Location: [facility] ORS;  Service: General;  Laterality: N/A;   HIATAL HERNIA REPAIR N/A 09/23/2015   Procedure: LAPAROSCOPIC REPAIR OF HIATAL HERNIA;  Surgeon: Orland Penman  Darnell Level, MD;  Location: [facility] ORS;  Service: General;  Laterality: N/A;   IRRIGATION AND DEBRIDEMENT FOOT Right 01/25/2017   Procedure: IRRIGATION AND DEBRIDEMENT FOOT;  Surgeon: Albertine Patricia, DPM;  Location: [facility] ORS;  Service: Podiatry;  Laterality: Right;   IRRIGATION AND DEBRIDEMENT FOOT Right 01/29/2017   Procedure: IRRIGATION AND DEBRIDEMENT FOOT and application of wound vac;  Surgeon: Albertine Patricia, DPM;  Location: [facility] ORS;  Service: Podiatry;  Laterality: Right;   LOWER EXTREMITY ANGIOGRAPHY Right 01/28/2017   Procedure: Lower Extremity  Angiography;  Surgeon: Katha Cabal, MD;  Location: [place] CV [facility];  Service: Cardiovascular;  Laterality: Right;   LOWER EXTREMITY ANGIOGRAPHY Left 03/06/2021   Procedure: Lower Extremity Angiography;  Surgeon: Katha Cabal, MD;  Location: [place] [facility];  Service: Cardiovascular;  Laterality: Left;   LOWER EXTREMITY INTERVENTION  01/28/2017   Procedure: Lower Extremity Intervention;  Surgeon: Katha Cabal, MD;  Location: [place] [facility];  Service: Cardiovascular;;   REDUCTION MAMMAPLASTY      SOCIAL HISTORY:   Social History   Tobacco Use   Smoking status: Never   Smokeless tobacco: Never  Substance Use Topics   Alcohol use: No    Alcohol/week: 0.0 standard drinks    FAMILY HISTORY:   Family History  Problem Relation Age of Onset   Diabetes Mother    Cancer Mother    Breast cancer Mother 43   Diabetes Father    Diabetes Sister    Diabetes Brother     DRUG ALLERGIES:  No Known Allergies  REVIEW OF SYSTEMS:   ROS As per history of present illness. All pertinent systems were reviewed above. Constitutional, HEENT, cardiovascular, respiratory, GI, GU, musculoskeletal, neuro, psychiatric, endocrine, integumentary and hematologic systems were reviewed and are otherwise negative/unremarkable except for positive findings mentioned above in the HPI.   MEDICATIONS AT HOME:   Prior to Admission medications   Medication Sig Start Date End Date Taking? Authorizing Provider  acetaminophen (TYLENOL) 325 MG tablet Take 2 tablets (650 mg total) by mouth every 6 (six) hours as needed for mild pain (or Fever >/= 101). 03/07/21   Nicole Kindred A, DO  albuterol (PROVENTIL HFA;VENTOLIN HFA) 108 (90 BASE) MCG/ACT inhaler Inhale 2 puffs into the lungs every 6 (six) hours as needed for wheezing or shortness of breath.    [provider]  aspirin 81 MG EC tablet Take 81 mg by mouth daily.    [provider]  carvedilol (COREG) 12.5 MG tablet  Take 12.5 mg by mouth 2 (two) times daily with a meal.    [provider]  clopidogrel (PLAVIX) 75 MG tablet Take 75 mg by mouth daily. 11/06/15   [provider]  furosemide (LASIX) 20 MG tablet Take 20 mg by mouth daily. 01/08/21   [provider]  gabapentin (NEURONTIN) 300 MG capsule Take 300 mg by mouth 3 (three) times daily.     [provider]  glimepiride (AMARYL) 2 MG tablet Take 2 mg by mouth daily with breakfast.    [provider]  letrozole (FEMARA) 2.5 MG tablet Take 1 tablet (2.5 mg total) by mouth daily. 11/21/17   Lloyd Huger, MD  linagliptin (TRADJENTA) 5 MG TABS tablet Take 5 mg by mouth daily.    [provider]  lisinopril (ZESTRIL) 20 MG tablet Take 20 mg by mouth daily. 04/15/21   [provider]  metFORMIN (GLUCOPHAGE) 1000 MG tablet Take 1,000 mg by mouth daily with breakfast.  [provider]  mupirocin ointment (BACTROBAN) 2 % Apply topically. 01/01/20   [provider]  PARoxetine (PAXIL) 40 MG tablet Take 40 mg by mouth daily.    [provider]  potassium chloride (KLOR-CON) 10 MEQ tablet Take 10 mEq by mouth daily. 04/15/21   [provider]  pravastatin (PRAVACHOL) 40 MG tablet Take 40 mg by mouth daily. In afternoon    [provider]  triamcinolone ointment (KENALOG) 0.5 % Apply 1 application topically 2 (two) times daily. 04/07/21   Kris Hartmann, NP  TRUE METRIX BLOOD GLUCOSE TEST test strip  10/13/18   [provider]  TRUEPLUS LANCETS 33G [place]  10/13/18   [provider]      VITAL SIGNS:  Blood pressure (!) 148/63, pulse (!) 58, temperature 98.4 F (36.9 C), temperature source Oral, resp. rate (!) 22, height 5\' 3"  (1.6 m), weight 90.7 kg, SpO2 98 %.  PHYSICAL EXAMINATION:  Physical Exam  GENERAL:  70 y.o.-year-old patient lying in the bed with no acute distress.  EYES: Pupils equal, round, reactive to light and accommodation.  No scleral icterus. Extraocular muscles intact.  HEENT: Head atraumatic, normocephalic. Oropharynx and nasopharynx clear.  NECK:  Supple, no jugular venous distention. No thyroid enlargement, no tenderness.  LUNGS: Normal breath sounds bilaterally, no wheezing, rales,rhonchi or crepitation. No use of accessory muscles of respiration.  CARDIOVASCULAR: Regular rate and rhythm, S1, S2 normal. No murmurs, rubs, or gallops.  ABDOMEN: Soft, nondistended, nontender. Bowel sounds present. No organomegaly or mass.  EXTREMITIES: No pedal edema, cyanosis, or clubbing.  NEUROLOGIC: Cranial nerves II through XII are intact. Muscle strength 5/5 in all extremities. Sensation intact. Gait not checked.  PSYCHIATRIC: The patient is alert and oriented x 3.  Normal affect and good eye contact. SKIN: No obvious rash, lesion, or ulcer.  Status post amputation of the left fifth toe.  LABORATORY PANEL:   CBC Recent Labs  Lab 04/26/21 2227  WBC 9.2  HGB 10.4*  HCT 33.2*  PLT 282   ------------------------------------------------------------------------------------------------------------------  Chemistries  Recent Labs  Lab 04/26/21 2227  NA 140  K 4.5  CL 105  CO2 32  GLUCOSE 114*  BUN 22  CREATININE 1.42*  CALCIUM 8.6*  AST 13*  ALT 10  ALKPHOS 74  BILITOT 0.5   ------------------------------------------------------------------------------------------------------------------  Cardiac Enzymes No results for input(s): TROPONINI in the last 168 hours. ------------------------------------------------------------------------------------------------------------------  RADIOLOGY:  CT HEAD WO CONTRAST  Result Date: 04/26/2021 CLINICAL DATA:  Word-finding difficulty EXAM: CT HEAD WITHOUT CONTRAST TECHNIQUE: Contiguous axial images were obtained from the base of the skull through the vertex without intravenous contrast. COMPARISON:  None. 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 No  hemorrhage or other acute abnormality. Right occipital chronic infarct. Vascular: Atherosclerotic calcification of the vertebral and internal carotid arteries at the skull base. No abnormal hyperdensity of the major intracranial arteries or dural venous sinuses. Skull: The visualized skull base, calvarium and extracranial soft tissues are normal. Sinuses/Orbits: No fluid levels or advanced mucosal thickening of the visualized paranasal sinuses. No mastoid or middle ear effusion. The orbits are normal. IMPRESSION: 1. No acute intracranial abnormality. 2. 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 Electronically Signed   By: Ulyses Jarred M.D.   On: 04/26/2021 22:55      IMPRESSION AND PLAN:  Active Problems:   TIA (transient ischemic attack)  1.  TIA with expressive dysphasia. - The patient will be admitted to an observation medically monitored bed. - We  will follow neurochecks every 4 hours for 24 hours. - We will add aspirin to her Plavix. - PT/OT consults will be obtained. - Neurology consult to be obtained. - I notified Dr. Curly [place] about the patient.  2.  Acute kidney injury, likely prerenal due to mild dehydration. - The patient will be placed on hydration with IV normal saline and will follow BMP. - We will avoid nephrotoxins.  3.  Essential hypertension. - We will continue Coreg and hold off Zestril.  4.  Type 2 diabetes mellitus with peripheral neuropathy. - The patient will be placed on supplemental coverage with NovoLog. - We will hold off metformin given acute kidney injury. - We will continue Amaryl and Neurontin.  5.  Depression. - We will continue Paxil.  6.  Dyslipidemia. - We will continue statin therapy.  7.  History of breast cancer. - We will continue Femara   DVT prophylaxis: Lovenox. Code Status: full code. Family Communication:  The plan of care was discussed in details with the patient (who requested no other family members to be notified at this time). I  answered all questions. The patient agreed to proceed with the above mentioned plan. Further management will depend upon hospital course. Disposition Plan: Back to previous home environment Consults called: Neurology consult.  We will all the records are reviewed and case discussed with ED provider.  Status is: Observation  The patient remains OBS appropriate and will d/c before 2 midnights.  Dispo: The patient is from: Home              Anticipated d/c is to: Home              Patient currently is not medically stable to d/c.   Difficult to place patient No  TOTAL TIME TAKING CARE OF THIS PATIENT: 55 minutes.    Christel Mormon M.D on 04/27/2021 at 4:34 AM  [facility]   From 7 PM-7 AM, contact night-coverage [email or web address]  CC: Primary care physician; Marguerita Merles, MD

## 2021-04-27 NOTE — Progress Notes (Signed)
[place] OF CARE NOTE Patient: Elizabeth [name] FEX:[id number]   PCP: Marguerita Merles, MD DOB: [birth year]   DOA: 04/27/2021   DOS: 04/27/2021    Patient was admitted by my colleague earlier on 04/27/2021. I have reviewed the H&P as well as assessment and plan and agree with the same. Important changes in the plan are listed below.  Plan of care: Active Problems:   TIA (transient ischemic attack)   MRI negative for acute stroke. Patient currently under the effect of Ativan which she received for the MRI.  Unable to complete examination. PT OT evaluation and Echocardiogram also pending.  Author: Berle Mull, MD [facility] 04/27/2021 3:52 PM   If 7PM-7AM, please contact night-coverage at [email or web address]

## 2021-04-27 NOTE — Progress Notes (Signed)
PHARMACIST - PHYSICIAN COMMUNICATION  CONCERNING:  Enoxaparin (Lovenox) for DVT Prophylaxis    RECOMMENDATION: Patient was prescribed enoxaprin 40mg  q24 hours for VTE prophylaxis.   Filed Weights   04/26/21 2222  Weight: 90.7 kg (200 lb)    Body mass index is 35.43 kg/m.  Estimated Creatinine Clearance: 40 mL/min (A) (by C-G formula based on SCr of 1.42 mg/dL (H)).   Based on [place] patient is candidate for enoxaparin 0.5mg /kg TBW SQ every 24 hours based on BMI being >30.  DESCRIPTION: Pharmacy has adjusted enoxaparin dose per [facility] policy.  Patient is now receiving enoxaparin 0.5 mg/kg every 24 hours   Renda Rolls, PharmD, [facility] 04/27/2021 4:32 AM

## 2021-04-27 NOTE — Progress Notes (Signed)
PT Cancellation Note  Patient Details Name: Elizabeth [name] MRN: [id number] DOB: 06-Mar-1951   Cancelled Treatment:    Reason Eval/Treat Not Completed: Patient at procedure or test/unavailable (Consult received and chart reviewed. Patient remains off unit for MRI.  Will re-attempt at later time/date as medically appropriate and available.)   Alfonza Toft H. Owens Shark, PT, DPT, NCS 04/27/21, 10:35 AM [phone]

## 2021-04-28 DIAGNOSIS — E860 Dehydration: Secondary | ICD-10-CM | POA: Diagnosis not present

## 2021-04-28 LAB — GLUCOSE, CAPILLARY
Glucose-Capillary: 75 mg/dL (ref 70–99)
Glucose-Capillary: 86 mg/dL (ref 70–99)
Glucose-Capillary: 94 mg/dL (ref 70–99)

## 2021-04-28 LAB — ECHOCARDIOGRAM LIMITED BUBBLE STUDY: S' Lateral: 2.72 cm

## 2021-04-28 NOTE — Progress Notes (Signed)
Patient and husband both verbalized understanding of discharge instructions including making follow up appointment with MD. Discharged home in stable condition.

## 2021-04-28 NOTE — Evaluation (Signed)
Physical Therapy Evaluation Patient Details Name: Elizabeth [name] MRN: [id number] DOB: 1951-09-02 Today's Date: 04/28/2021   History of Present Illness  70yo female with PMHx including HTN, HLD, CKD3, DMII, CVA w/ residual L-side weakness, psoriasis, PVD, recent L 5th toe amputation, hx BRCA, and lymphedema, presented to the ED with onset of expressive dysphagia lasting ~36mn evening of 04/26/21 with associated headache, no parasthesias or focal muscle weakness. She admits to right sided back pain without dysuria, oliguria or hematuria or flank pain. Imaging negative for acute infarct.   Clinical Impression  Patient received in recliner, she is agreeable to PT session. Patient requires min guard for sit to stand and for ambulation with RW. During ambulation patient began having loose stool dropping onto floor. Patient seemed unaware. Assisted patient to bathroom and with cleaning up. Patient steady with static standing. She will continue to benefit from skilled PT while here to improve functional independence and strength.    Follow Up Recommendations Home health PT;Supervision for mobility/OOB    Equipment Recommendations  None recommended by PT    Recommendations for Other Services       Precautions / Restrictions Precautions Precautions: Fall Restrictions Weight Bearing Restrictions: Yes LLE Weight Bearing: Partial weight bearing LLE Partial Weight Bearing Percentage or Pounds: Patient was previously PWB due to amputation but that was in April. Other Position/Activity Restrictions: post-op shoe, not sure that she needs anymore, She reports Dr. CCleda Mccreedysaid amputaion site was healed.      Mobility  Bed Mobility Overal bed mobility: Needs Assistance Bed Mobility: Supine to Sit     Supine to sit: Supervision;HOB elevated     General bed mobility comments: Not assessed, patient in recliner and remained in recliner after session    Transfers Overall transfer level: Needs  assistance Equipment used: Rolling walker (2 wheeled) Transfers: Sit to/from Stand Sit to Stand: Min guard            Ambulation/Gait Ambulation/Gait assistance: Min guard Gait Distance (Feet): 25 Feet Assistive device: Rolling walker (2 wheeled) Gait Pattern/deviations: Step-through pattern Gait velocity: slightly decreased   General Gait Details: patient was ambulating to door and began having loose stool, went into bathroom and required extensive clean up/gown change. Generally steady with min guard for ambulation.  Stairs            Wheelchair Mobility    Modified Rankin (Stroke Patients Only)       Balance Overall balance assessment: Needs assistance Sitting-balance support: Feet supported Sitting balance-Leahy Scale: Good     Standing balance support: Bilateral upper extremity supported;During functional activity Standing balance-Leahy Scale: Fair Standing balance comment: during dynamic standing requires at least single UE support. If static standing she is able to maintain balance without UE support.                             Pertinent Vitals/Pain Pain Assessment: No/denies pain    Home Living Family/patient expects to be discharged to:: Private residence Living Arrangements: Spouse/significant other Available Help at Discharge: Family;Available PRN/intermittently Type of Home: House Home Access: Stairs to enter Entrance Stairs-Rails: Right;Left;Can reach both Entrance Stairs-Number of Steps: 3 Home Layout: One level Home Equipment: Walker - 2 wheels;Bedside commode;Other (comment);Cane - single point Additional Comments: pt reports hurry cane    Prior Function Level of Independence: Independent with assistive device(s)         Comments: Pt reports ambulating wiht hurry cane + post-op shoe,  has 2WW if needed, denies need for assist with sponge bathing and dressing. Endorses "several falls" 2/2 LOB in past 88mo but non since  recent L 5th toe amputation in April 2022.     Hand Dominance   Dominant Hand: Right    Extremity/Trunk Assessment   Upper Extremity Assessment Upper Extremity Assessment: Defer to OT evaluation    Lower Extremity Assessment Lower Extremity Assessment: Generalized weakness    Cervical / Trunk Assessment Cervical / Trunk Assessment: Normal  Communication   Communication: No difficulties  Cognition Arousal/Alertness: Awake/alert Behavior During Therapy: WFL for tasks assessed/performed Overall Cognitive Status: Within Functional Limits for tasks assessed                                        General Comments      Exercises Other Exercises Other Exercises: Pt educated in role/benefit of additional OT services to address impairments in balance, strength, and falls risk   Assessment/Plan    PT Assessment Patient needs continued PT services  PT Problem List Decreased strength;Decreased mobility;Decreased balance;Decreased safety awareness       PT Treatment Interventions Therapeutic exercise;Gait training;Balance training;Stair training;Functional mobility training;Therapeutic activities;Patient/family education;DME instruction    PT Goals (Current goals can be found in the Care Plan section)  Acute Rehab PT Goals Patient Stated Goal: get stronger and go home PT Goal Formulation: With patient Time For Goal Achievement: 05/07/21 Potential to Achieve Goals: Good    Frequency Min 2X/week   Barriers to discharge        Co-evaluation               AM-PAC PT "6 Clicks" Mobility  Outcome Measure Help needed turning from your back to your side while in a flat bed without using bedrails?: A Little Help needed moving from lying on your back to sitting on the side of a flat bed without using bedrails?: A Little Help needed moving to and from a bed to a chair (including a wheelchair)?: A Little Help needed standing up from a chair using your arms  (e.g., wheelchair or bedside chair)?: A Little Help needed to walk in hospital room?: A Little Help needed climbing 3-5 steps with a railing? : A Little 6 Click Score: 18    End of Session Equipment Utilized During Treatment: Gait belt Activity Tolerance: Patient tolerated treatment well Patient left: in chair;with call bell/phone within reach Nurse Communication: Mobility status PT Visit Diagnosis: Muscle weakness (generalized) (M62.81);Difficulty in walking, not elsewhere classified (R26.2);Unsteadiness on feet (R26.81)    Time: [address]25[address]62PT Time Calculation (min) (ACUTE ONLY): 19 min   Charges:   PT Evaluation $PT Eval Moderate Complexity: 1 Mod PT Treatments $Gait Training: 8-22 mins        Destany Severns, PT, GCS 04/28/21,10:55 AM
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
## 2021-04-28 NOTE — Progress Notes (Signed)
Patient refusing bed and chair alarm despite being aware of unsteady gait and balance issues.

## 2021-04-28 NOTE — TOC Initial Note (Signed)
Transition of Care [facility]) - Initial/Assessment Note    Patient Details  Name: Elizabeth [name] MRN: [id number] Date of Birth: [birth year]  Transition of [facility]) CM/SW Contact:    Shelbie Hutching, RN Phone Number: 04/28/2021, 10:28 AM  Clinical Narrative:                 Patient placed under observation for TIA. RNCM met with patient at the beside this morning to discuss discharge planning.  Patient is from home with her husband.  Patient is independent in ADL's, she walks with a cane but also has a walker.  Patient has a 3 in 1 at home as well.  Patient does not drive her husband provides transportation.  Patient is current with PCP and uses [facility] for her prescriptions.  Patient agrees to home health as recommended by PT and OT.  Patient has used [facility] in the past and would like to use them again.  Cory with [facility] has accepted referral.   Patient's husband will pick her up at discharge.  Expected Discharge Plan: [place] Barriers to Discharge: Barriers Resolved   Patient Goals and CMS Choice Patient states their goals for this hospitalization and ongoing recovery are:: Wants to go home and agrees to home health services CMS Medicare.gov Compare Post Acute Care list provided to:: Patient Choice offered to / list presented to : Patient  Expected Discharge Plan and Services Expected Discharge Plan: [place]   Discharge Planning Services: CM Consult Post Acute Care Choice: [place] arrangements for the past 2 months: Single Family Home                 DME Arranged: N/A DME Agency: NA       HH Arranged: PT, OT HH Agency: [place] Date [facility] Agency Contacted: 04/28/21 Time HH Agency Contacted: 1024 Representative spoke with at [place]: Tommi Rumps  Prior Living Arrangements/Services Living arrangements for the past 2 months: [place] Lives with:: Spouse Patient language and need for interpreter  reviewed:: Yes Do you feel safe going back to the place where you live?: Yes      Need for Family Participation in Patient Care: Yes (Comment) (TIA) Care giver support system in place?: Yes (comment) (husband) Current home services: DME (cane, walker, bedside commode) Criminal Activity/Legal Involvement Pertinent to Current Situation/Hospitalization: No - Comment as needed  Activities of Daily Living Home Assistive Devices/Equipment: Walker (specify type) ADL Screening (condition at time of admission) Patient's cognitive ability adequate to safely complete daily activities?: Yes Is the patient deaf or have difficulty hearing?: No Does the patient have difficulty seeing, even when wearing glasses/contacts?: No Does the patient have difficulty concentrating, remembering, or making decisions?: No Patient able to express need for assistance with ADLs?: Yes Does the patient have difficulty dressing or bathing?: No Independently performs ADLs?: Yes (appropriate for developmental age) Does the patient have difficulty walking or climbing stairs?: No Weakness of Legs: None Weakness of Arms/Hands: None  Permission Sought/Granted Permission sought to share information with : Case Manager, Family Supports, Other (comment) Permission granted to share information with : Yes, Verbal Permission Granted  Share Information with NAME: Lalo  Permission granted to share info w AGENCY: [place] granted to share info w Relationship: husband     Emotional Assessment Appearance:: Appears older than stated age Attitude/Demeanor/Rapport: Engaged Affect (typically observed): Accepting Orientation: : Oriented to Self, Oriented to Place, Oriented to  Time, Oriented to Situation Alcohol / Substance Use: Not Applicable Psych Involvement: No (comment)  Admission diagnosis:  Dehydration [E86.0] TIA (transient ischemic attack) [G45.9] AKI (acute kidney injury) ([place]) [N17.9] Patient Active Problem List    Diagnosis Date Noted   TIA (transient ischemic attack) 04/27/2021   Diabetic polyneuropathy associated with type 2 diabetes mellitus ([place]) 04/07/2021   Cellulitis in diabetic foot ([place]) 03/03/2021   Sepsis ([place]) 05/10/2018   Pressure injury of skin 05/10/2018   Malignant neoplasm of female breast ([place]) 02/02/2017   Osteomyelitis of right foot ([place]) 01/28/2017   Type 2 diabetes mellitus with peripheral vascular disease ([place]) 01/28/2017   Cellulitis 01/22/2017   Hypokalemia 01/22/2017   Morbid obesity ([place]) 09/23/2015   Biliary sludge 07/23/2015   Steatosis of liver 07/23/2015   Diabetes mellitus, type 2 ([place]) 05/21/2015   Peripheral vascular disease ([place]) 05/21/2015   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 ([place]) 05/21/2015   Chronic venous insufficiency 02/05/2015   Mixed incontinence 10/24/2014   Extreme obesity 10/24/2014   Hernia, rectovaginal 10/24/2014   Mixed stress and urge urinary incontinence 10/24/2014   Airway hyperreactivity 02/19/2013   Cerebral vascular accident ([place]) 02/19/2013   Clinical depression 02/19/2013   Diabetes mellitus type 2 in obese ([place]) 02/19/2013   Colon, diverticulosis 02/19/2013   Acid reflux 02/19/2013   HLD (hyperlipidemia) 02/19/2013   BP (high blood pressure) 02/19/2013   PCP:  Marguerita Merles, MD Pharmacy:   [place], [place] - [address] [place] [address] Phone: [phone] Fax: [phone]  [facility] - [address] [place] [address] Phone: [phone] Fax: [phone]  [id number] [place] [id number]: [phone]: [phone]    Social Determinants of Health (SDOH) Interventions    Readmission Risk Interventions No flowsheet data found.

## 2021-04-28 NOTE — Evaluation (Signed)
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 She admits to right sided back pain without dysuria, oliguria or hematuria or flank pain. Imaging negative for acute infarct.   Clinical Impression   Pt was seen for OT evaluation this date. 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 Pt lives with her spouse in a 1 story home, 3 steps + bilat rails. Pt performed bed mobility with supervision, ADL transfers + RW with CGA for safety. MIN A to don post-op shoe, set up for donning tennis shoe to R foot while seated, and MIN A for additional LB dressing and LB bathing.  Currently pt demonstrates impairments in balance, activity tolerance, and strength as described below (See OT problem list) which functionally limit her ability to perform ADL/self-care tasks. No focal weakness, coordination, sensory, cognitive, or new visual deficits appreciated. Pt with baseline L eye low vision. Pt would benefit from skilled OT services to address noted impairments and functional limitations (see below for any additional details) in order to maximize safety and independence while minimizing falls risk and caregiver burden. Upon hospital discharge, recommend HHOT to maximize pt safety and return to functional independence during meaningful occupations of daily life. Pt endorses previously declining home health services. Pt educated in benefits given her impairments and functional deficits as related to independence and falls  risk. Pt verbalized understanding and agreeable to [facility] at discharge. Case mgt and RN notified.       Follow Up Recommendations  Home health OT;Supervision - Intermittent    Equipment Recommendations  None recommended by OT    Recommendations for Other Services       Precautions / Restrictions Precautions Precautions: Fall Restrictions LLE Partial Weight Bearing Percentage or Pounds: Pt was previously partial weight bearing  LLE heel weight bearing with post op shoe from 5th toe amputation in April 2022 Other Position/Activity Restrictions: post-op shoe      Mobility Bed Mobility Overal bed mobility: Needs Assistance Bed Mobility: Supine to Sit     Supine to sit: Supervision;HOB elevated          Transfers Overall transfer level: Needs assistance Equipment used: Rolling walker (2 wheeled) Transfers: Sit to/from Stand Sit to Stand: Min guard              Balance Overall balance assessment: Needs assistance Sitting-balance support: No upper extremity supported;Feet supported Sitting balance-Leahy Scale: Fair     Standing balance support: Bilateral upper extremity supported Standing balance-Leahy Scale: Fair                             ADL either performed or assessed with clinical judgement   ADL Overall ADL's : Needs assistance/impaired                                       General ADL Comments: Pt able to don tennis shoe to R foot with set  up while seated, MIN A to don post-op shoe on L foot. Pt reports spouse able to assist at home for this. Set up and supervision for seated ADL, PRN MIN A for LB ADL.     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, post-op shoe donned)       Communication Communication Communication: No difficulties   Cognition Arousal/Alertness: Awake/alert Behavior During Therapy: WFL for tasks assessed/performed Overall Cognitive Status: Within Functional Limits for tasks assessed                                     General Comments       Exercises Other Exercises Other Exercises: Pt educated in role/benefit of additional OT services to address impairments in balance, strength, and falls risk   Shoulder Instructions      Home Living Family/patient expects to be discharged to:: Private residence Living Arrangements: Spouse/significant other Available Help at Discharge: Family;Available PRN/intermittently (spouse works 4am-3pm) Type of Home: House Home Access: Stairs to enter [employer] of Steps: 3 Entrance Stairs-Rails: Right;Left;Can reach both Home Layout: One level     Bathroom Shower/Tub: Tub/shower unit         Home Equipment: Environmental consultant - 2 wheels;Bedside commode;Other (comment)   Additional Comments: pt reports hurry cane      Prior Functioning/Environment Level of Independence: Independent with assistive device(s)        Comments: Pt reports ambulating wiht hurry cane + post-op shoe, has 2WW if needed, denies need for assist with sponge bathing and dressing. 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        OT Problem List: Decreased strength;Decreased range of motion;Decreased activity tolerance;Impaired balance (sitting and/or standing);Decreased knowledge of use of DME or AE;Impaired vision/perception      OT Treatment/Interventions: Self-care/ADL  training;Therapeutic exercise;Therapeutic activities;DME and/or AE instruction;Patient/family education;Balance training;Energy conservation    OT Goals(Current goals can be found in the care plan section) Acute Rehab OT Goals Patient Stated Goal: get stronger and go home OT Goal Formulation: With patient Time For Goal Achievement: 05/12/21 Potential to Achieve Goals: Good ADL Goals Pt Will Perform Lower Body Dressing: sit to/from stand;with modified independence Pt Will Transfer to Toilet: with supervision;ambulating (elevated commode, LRAD for amb) Additional ADL Goal #1: Pt will perform UB and LB dressing with modified independence Additional ADL Goal #2: Pt will perform seated UB and LB bathing with remote supervision  OT Frequency: Min 2X/week   Barriers to D/C: Decreased caregiver support          Co-evaluation              AM-PAC OT "6 Clicks" Daily Activity     Outcome Measure Help from another person eating meals?: None Help from another person taking care of personal grooming?: A Little Help from another person toileting, which includes using toliet, bedpan, or urinal?: A  Little Help from another person bathing (including washing, rinsing, drying)?: A Little Help from another person to put on and taking off regular upper body clothing?: None Help from another person to put on and taking off regular lower body clothing?: A Little 6 Click Score: 20   End of Session Equipment Utilized During Treatment: Rolling walker Nurse Communication: Mobility status;Other (comment) (chair alarm in place, but not connected)  Activity Tolerance: Patient tolerated treatment well Patient left: in chair;with call bell/phone within reach  OT Visit Diagnosis: Other abnormalities of gait and mobility (R26.89);Repeated falls (R29.6);Muscle weakness (generalized) (M62.81)                Time: 5465-0354 OT Time Calculation (min): 27 min Charges:  OT General Charges $OT Visit: 1  Visit OT Evaluation $OT Eval Moderate Complexity: 1 Mod OT Treatments $Self Care/Home Management : 8-22 mins  Hanley Hays, MPH, MS, OTR/L ascom [phone] 04/28/21, 10:34 AM

## 2021-04-28 NOTE — Care Management Obs Status (Signed)
Between NOTIFICATION   Patient Details  Name: CHERYLENE FERRUFINO MRN: [id number] Date of Birth: [birth year]   Medicare Observation Status Notification Given:  Yes    Shelbie Hutching, RN 04/28/2021, 10:09 AM

## 2021-04-28 NOTE — Plan of Care (Signed)
  Problem: Education: Goal: Knowledge of disease or condition will improve Outcome: Progressing   
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
## 2021-06-15 ENCOUNTER — Other Ambulatory Visit: Payer: Self-pay | Admitting: Family Medicine

## 2021-06-15 DIAGNOSIS — Z1231 Encounter for screening mammogram for malignant neoplasm of breast: Secondary | ICD-10-CM

## 2021-06-30 ENCOUNTER — Ambulatory Visit
Admission: RE | Admit: 2021-06-30 | Discharge: 2021-06-30 | Disposition: A | Discharge status: Home / Self Care | Payer: Medicare HMO | Source: Ambulatory Visit | Attending: Family Medicine | Admitting: Family Medicine

## 2021-06-30 ENCOUNTER — Other Ambulatory Visit: Payer: Self-pay

## 2021-06-30 DIAGNOSIS — Z1231 Encounter for screening mammogram for malignant neoplasm of breast: Secondary | ICD-10-CM | POA: Diagnosis not present

## 2021-06-30 IMAGING — MG MM DIGITAL SCREENING BILAT W/ TOMO AND CAD
6 of 10 series · 6 of 30 positions shown · non-contrast
Comparison: Previous exam(s).

ACR Breast Density Category a: The breast tissue is almost entirely
fatty.

CLINICAL DATA: Screening.

EXAM:
DIGITAL SCREENING BILATERAL MAMMOGRAM WITH TOMOSYNTHESIS AND CAD
TECHNIQUE: Bilateral screening digital craniocaudal and mediolateral oblique
mammograms were obtained. Bilateral screening digital breast
tomosynthesis was performed. The images were evaluated with
computer-aided detection.

[R MLO synth-2D (1 of 2)]
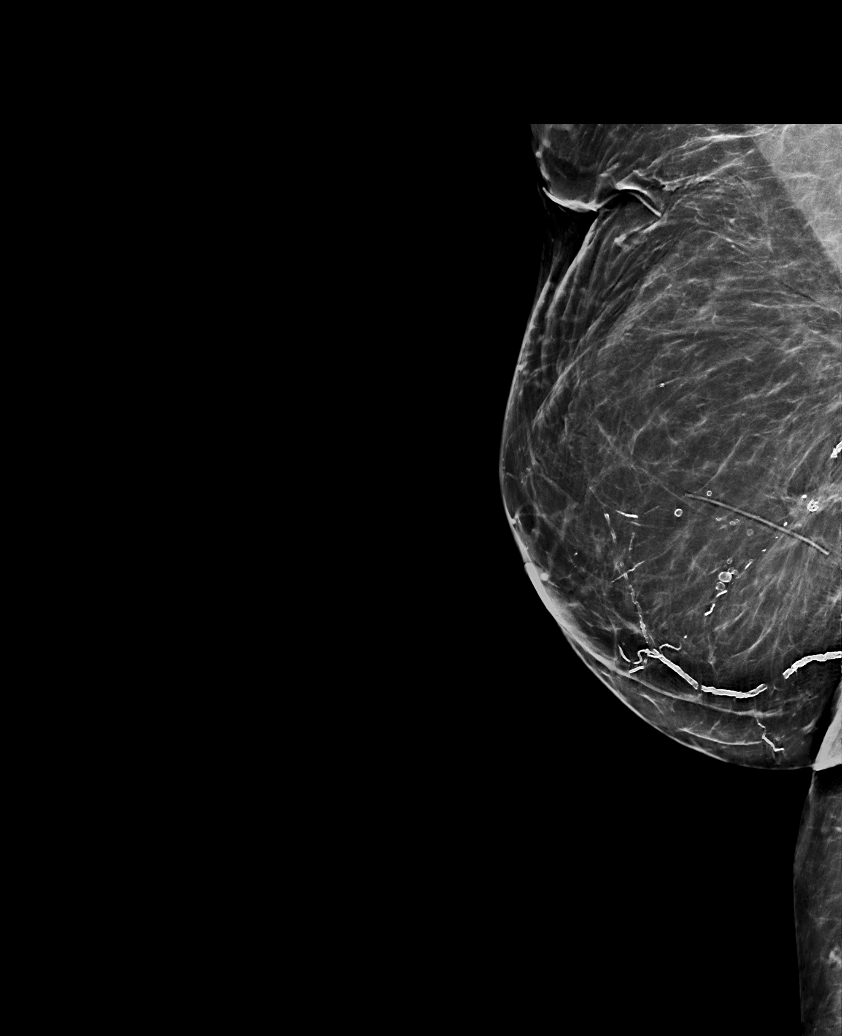

[L CC synth-2D]
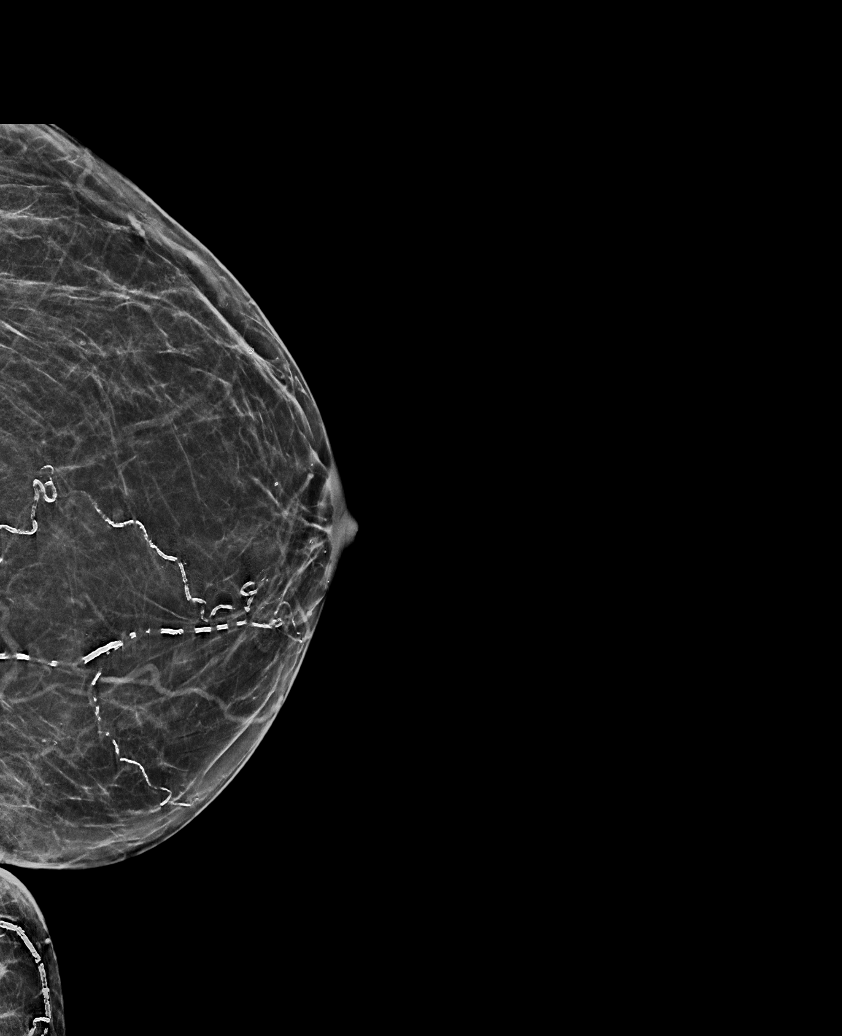

[R CC synth-2D]
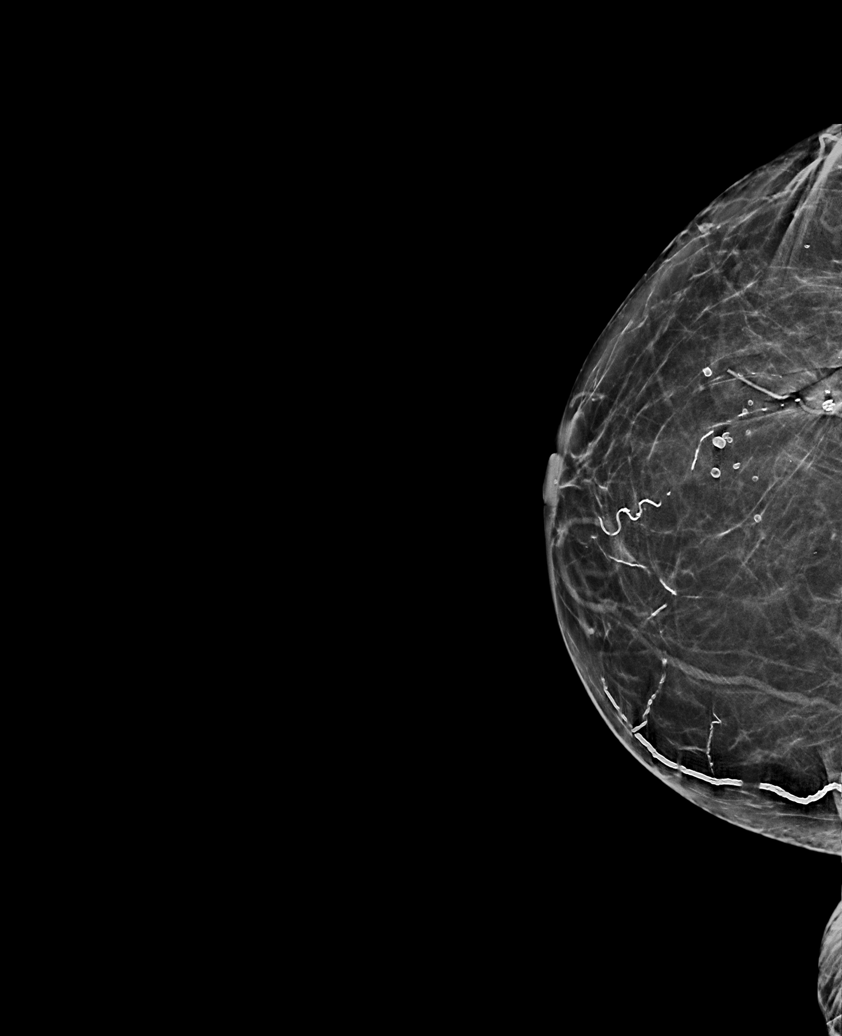

[L MLO synth-2D]
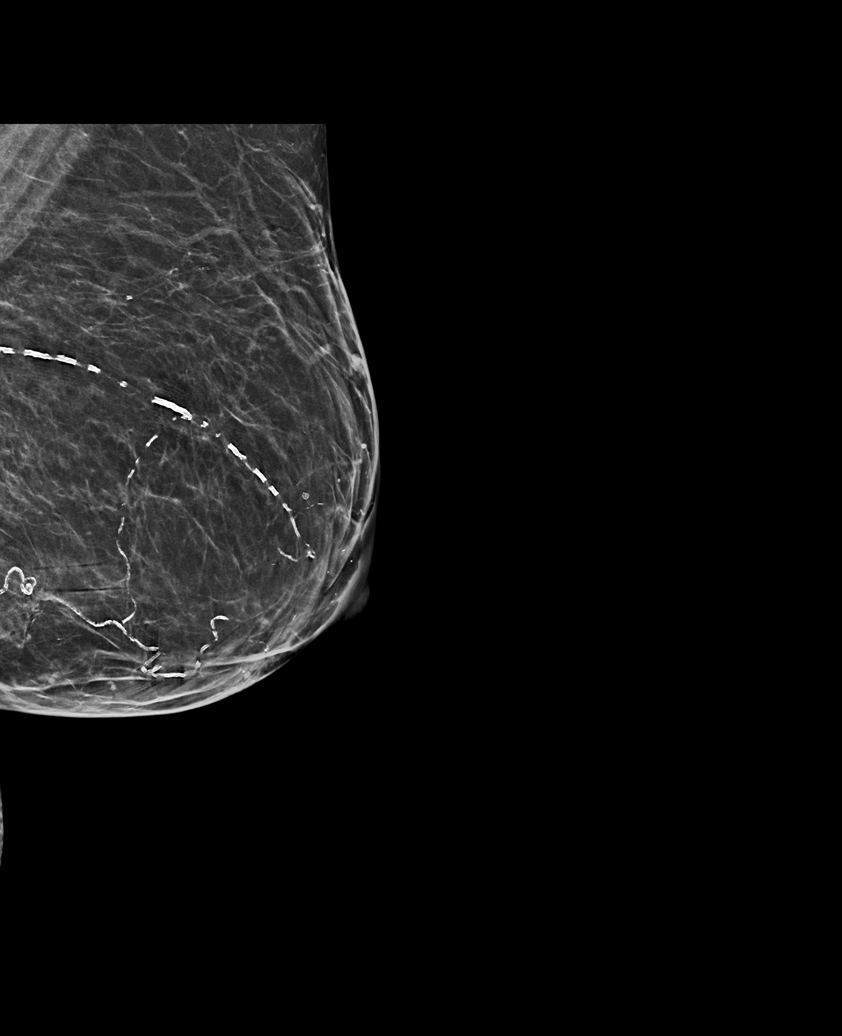

[R MLO synth-2D (2 of 2)]
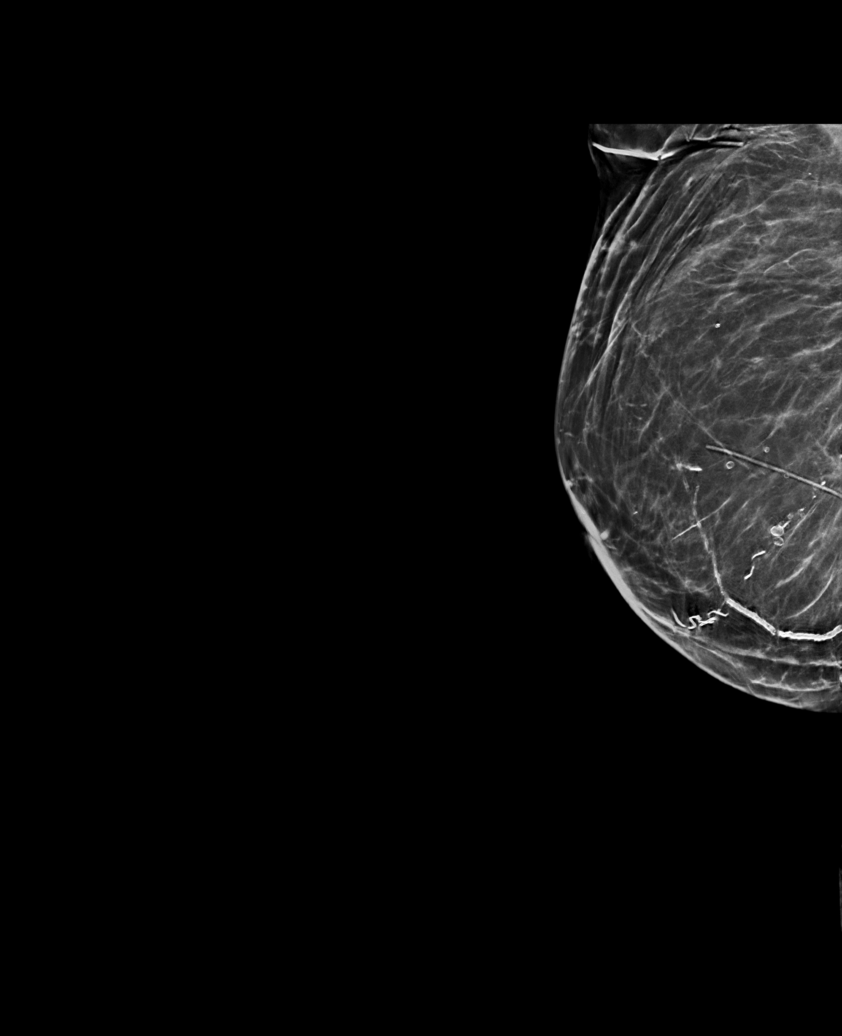

[L CC tomo · tomo slice 27/53.0]
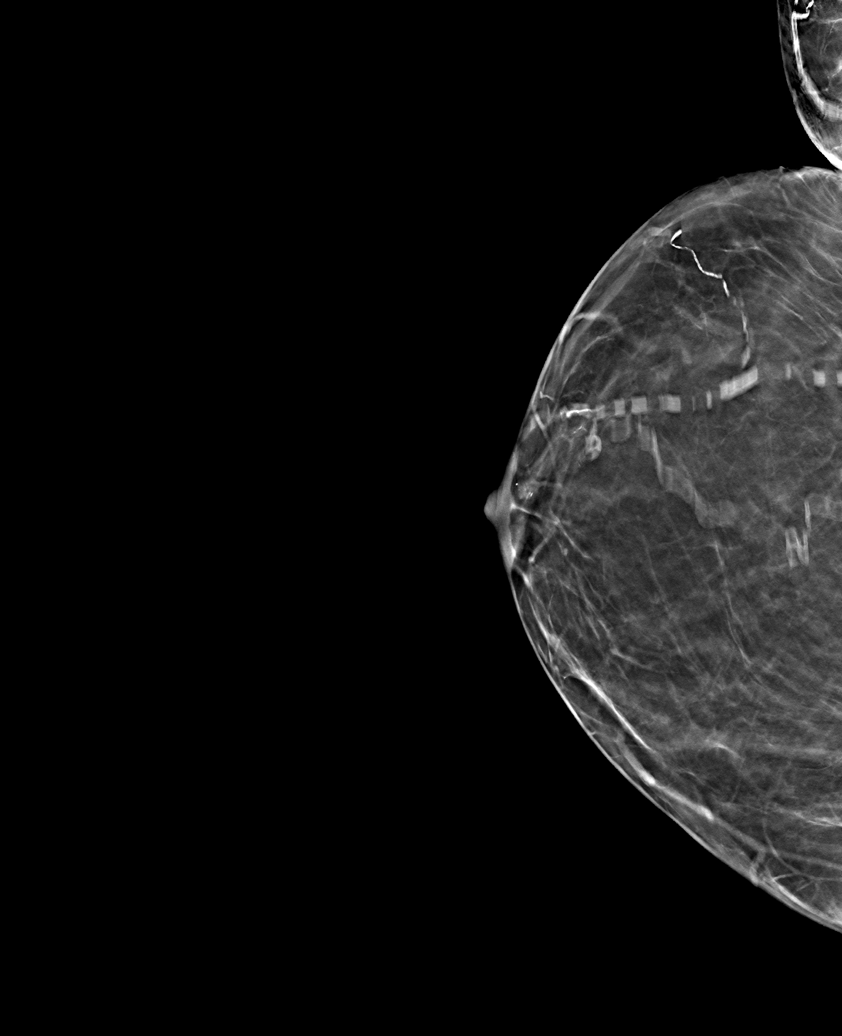

[6 of 30 positions shown; findings below may reference images not displayed]

FINDINGS: There are no findings suspicious for malignancy.
IMPRESSION: No mammographic evidence of malignancy. A result letter of this
screening mammogram will be mailed directly to the patient.

RECOMMENDATION:
Screening mammogram in one year. (Code:[8U])

BI-RADS CATEGORY  1: Negative.

## 2021-07-10 NOTE — Progress Notes (Signed)
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  INTERVAL HISTORY: Patient returns to clinic today for routine evaluation.  She currently feels well and is asymptomatic.  She has no neurologic complaints. She denies any recent fevers or illnesses. She has a good appetite and denies weight loss.  She denies any chest pain, shortness of breath, cough, or hemoptysis.  She has no nausea, vomiting, constipation, or diarrhea. She has no urinary complaints.  Patient offers no specific complaints today.  REVIEW OF SYSTEMS:   Review of Systems  Constitutional: Negative.  Negative for fever, malaise/fatigue and weight loss.  Respiratory: Negative.  Negative for cough and shortness of breath.   Cardiovascular: Negative.  Negative for chest pain and leg swelling.  Gastrointestinal: Negative.  Negative for abdominal pain and constipation.  Genitourinary: Negative.  Negative for dysuria.  Musculoskeletal: Negative.  Negative for myalgias.  Skin: Negative.  Negative for rash.  Neurological: Negative.  Negative for sensory change, focal weakness, weakness and headaches.  Psychiatric/Behavioral: Negative.  The patient is not nervous/anxious.    As per HPI. Otherwise, a complete review of systems is negative.   PAST MEDICAL HISTORY: Past Medical History:  Diagnosis Date   Cancer [facility])    breast   Carcinoma of right breast ([place]) 05/20/2014   Diabetes mellitus without complication ([place])    Hypertension    Lymphedema    Personal history of radiation therapy 2015   Right breast   PONV (postoperative nausea and vomiting)    Psoriasis 1990    Stroke ([place]) 2000   residual left arm weakness   Varicose veins     PAST SURGICAL HISTORY: Past Surgical History:  Procedure Laterality Date   AMPUTATION Left 03/04/2021   Procedure: AMPUTATION RAY;  Surgeon: Sharlotte Alamo, DPM;  Location: [facility] OR[facility];  Service: Podiatry;  Laterality: Left;   AMPUTATION TOE Right 01/25/2017   Procedure: AMPUTATION TOE   ;  Surgeon: Albertine Patricia, DPM;  Location: [facility] OR[facility];  Service: Podiatry;  Laterality: Right;   BREAST EXCISIONAL BIOPSY Right 2015   [facility] / with radiation   BREAST LUMPECTOMY Right 04-2014   followed by radiation,  no chemo   BREAST REDUCTION SURGERY  1977   CHOLECYSTECTOMY N/A 09/23/2015   Procedure: LAPAROSCOPIC CHOLECYSTECTOMY;  Surgeon: Bonner Puna, MD;  Location: [facility] OR[facility];  Service: General;  Laterality: N/A;   DILATION AND CURETTAGE OF UTERUS  2010   EYE SURGERY Left 2015   cataracts and left eye detached retina repair   EYE SURGERY Right 2013   cataract with len implant   GASTRIC ROUX-EN-Y N/A 09/23/2015   Procedure: LAPAROSCOPIC ROUX-EN-Y GASTRIC;  Surgeon: Bonner Puna, MD;  Location: [facility] OR[facility];  Service: General;  Laterality: N/A;   HIATAL HERNIA REPAIR N/A 09/23/2015   Procedure: LAPAROSCOPIC REPAIR OF HIATAL HERNIA;  Surgeon: Bonner Puna, MD;  Location: [facility];  Service: General;  Laterality: N/A;   IRRIGATION AND DEBRIDEMENT FOOT Right 01/25/2017   Procedure: IRRIGATION AND DEBRIDEMENT FOOT;  Surgeon: Albertine Patricia, DPM;  Location: [facility];  Service: Podiatry;  Laterality: Right;   IRRIGATION AND DEBRIDEMENT FOOT Right 01/29/2017   Procedure: IRRIGATION  AND DEBRIDEMENT FOOT and application of wound vac;  Surgeon: Albertine Patricia, DPM;  Location: [facility];  Service: Podiatry;  Laterality: Right;   LOWER EXTREMITY ANGIOGRAPHY Right 01/28/2017   Procedure: Lower Extremity Angiography;  Surgeon: Katha Cabal, MD;  Location: [place] [facility];  Service: Cardiovascular;  Laterality: Right;   LOWER EXTREMITY ANGIOGRAPHY  Left 03/06/2021   Procedure: Lower Extremity Angiography;  Surgeon: Katha Cabal, MD;  Location: [place] [facility];  Service: Cardiovascular;  Laterality: Left;   LOWER EXTREMITY INTERVENTION  01/28/2017   Procedure: Lower Extremity Intervention;  Surgeon: Katha Cabal, MD;  Location: [place] [facility];  Service: Cardiovascular;;   REDUCTION MAMMAPLASTY      FAMILY HISTORY Family History  Problem Relation Age of Onset   Diabetes Mother    Cancer Mother    Breast cancer Mother 61   Diabetes Father    Diabetes Sister    Diabetes Brother     GYNECOLOGIC HISTORY:  No LMP recorded. Patient is postmenopausal.     ADVANCED DIRECTIVES:    HEALTH MAINTENANCE: Social History   Tobacco Use   Smoking status: Never   Smokeless tobacco: Never  Substance Use Topics   Alcohol use: No    Alcohol/week: 0.0 standard drinks   Drug use: No    No Known Allergies  Current Outpatient Medications  Medication Sig Dispense Refill   acetaminophen (TYLENOL) 325 MG tablet Take 2 tablets (650 mg total) by mouth every 6 (six) hours as needed for mild pain (or Fever >/= 101).     albuterol (PROVENTIL HFA;VENTOLIN HFA) 108 (90 BASE) MCG/ACT inhaler Inhale 2 puffs into the lungs every 6 (six) hours as needed for wheezing or shortness of breath.     Calcium Carb-Cholecalciferol (CALCIUM 1000 + D PO) Take 1 tablet by mouth daily.     clopidogrel (PLAVIX) 75 MG tablet Take 75 mg by mouth daily.     furosemide (LASIX) 20 MG tablet Take 20 mg by mouth daily.     gabapentin (NEURONTIN) 300 MG capsule Take 300 mg by mouth 3 (three) times daily.      linagliptin (TRADJENTA) 5 MG TABS tablet Take 5 mg by mouth daily.     lisinopril (ZESTRIL) 20 MG tablet Take 20 mg by mouth daily.     metFORMIN (GLUCOPHAGE) 1000 MG tablet Take 1,000 mg by mouth daily with breakfast.      PARoxetine (PAXIL) 40 MG tablet Take 40 mg by mouth daily.     potassium chloride (KLOR-CON) 10 MEQ tablet Take 10 mEq by  mouth daily.     pravastatin (PRAVACHOL) 40 MG tablet Take 40 mg by mouth daily. In afternoon     triamcinolone ointment (KENALOG) 0.5 % Apply 1 application topically 2 (two) times daily. 30 g 3   TRUE METRIX BLOOD GLUCOSE TEST test strip      TRUEPLUS LANCETS 33G MISC      aspirin 81 MG EC tablet Take 81 mg by mouth daily. (Patient not taking: Reported on 07/14/2021)     escitalopram (LEXAPRO) 10 MG tablet Take 10 mg by mouth daily.     glimepiride (AMARYL) 2 MG tablet Take 2 mg by mouth daily with breakfast. (Patient not taking: Reported on 07/14/2021)     letrozole (FEMARA) 2.5 MG tablet Take 1 tablet (2.5 mg total) by mouth daily. (Patient not taking: Reported on 07/14/2021) 90 tablet 3   No current facility-administered medications for this visit.    OBJECTIVE:  BP 122/63   Pulse 61   Temp 98.2 F (36.8 C)   Resp 16   Wt 206 lb 14.4 oz (93.8 kg)   BMI 36.65 kg/m    Body mass index is 36.65 kg/m.    ECOG FS:0 - Asymptomatic  General: Well-developed, well-nourished, no acute distress. Eyes: Pink conjunctiva, anicteric sclera. HEENT: Normocephalic, moist mucous membranes. Breasts: Exam deferred today. Lungs: No audible wheezing or coughing. Heart: Regular rate and rhythm. Abdomen: Soft, nontender, no obvious distention. Musculoskeletal: No edema, cyanosis, or clubbing. Neuro: Alert, answering all questions appropriately. Cranial nerves grossly intact. Skin: No rashes or petechiae noted. Psych: Normal affect.   LAB RESULTS:  No visits with results within 3 Day(s) from this visit.  Latest known visit with results is:  Admission on 04/27/2021, Discharged on 04/28/2021  Component Date Value Ref Range Status   Glucose-Capillary 04/26/2021 111 (A) 70 - 99 mg/dL Final   Glucose reference range applies only to samples taken after fasting for at least 8 hours.   Prothrombin Time 04/26/2021 12.2  11.4 - 15.2 seconds Final   INR 04/26/2021 0.9  0.8 - 1.2 Final   Comment: (NOTE) INR  goal varies based on device and disease states. Performed at [facility], [address] [place], [place] [address]    aPTT 04/26/2021 27  24 - 36 seconds Final   Performed at [facility], [address] [place], [place] [address]   WBC 04/26/2021 9.2  4.0 - 10.5 K/uL Final   RBC 04/26/2021 3.80 (A) 3.87 - 5.11 MIL/uL Final   Hemoglobin 04/26/2021 10.4 (A) 12.0 - 15.0 g/dL Final   HCT 04/26/2021 33.2 (A) 36.0 - 46.0 % Final   MCV 04/26/2021 87.4  80.0 - 100.0 fL Final   MCH 04/26/2021 27.4  26.0 - 34.0 pg Final   MCHC 04/26/2021 31.3  30.0 - 36.0 g/dL Final   RDW 04/26/2021 14.6  11.5 - 15.5 % Final   Platelets 04/26/2021 282  150 - 400 K/uL Final   nRBC 04/26/2021 0.0  0.0 - 0.2 % Final   Performed at [facility], [address] [place], [place] [address]   Neutrophils Relative % 04/26/2021 64  % Final   Neutro Abs 04/26/2021 5.8  1.7 - 7.7 K/uL Final   Lymphocytes Relative 04/26/2021 23  % Final   Lymphs Abs 04/26/2021 2.1  0.7 - 4.0 K/uL Final   Monocytes Relative 04/26/2021 9  % Final   Monocytes Absolute 04/26/2021 0.8  0.1 - 1.0 K/uL Final   Eosinophils Relative 04/26/2021 3  % Final   Eosinophils Absolute 04/26/2021 0.3  0.0 - 0.5 K/uL Final   Basophils Relative 04/26/2021 1  % Final   Basophils Absolute 04/26/2021 0.1  0.0 - 0.1 K/uL Final   Immature Granulocytes 04/26/2021 0  % Final   Abs Immature Granulocytes 04/26/2021 0.03  0.00 - 0.07 K/uL Final   Performed at [facility], [place], [place] [address]   Sodium 04/26/2021 140  135 - 145 mmol/L Final   Potassium 04/26/2021 4.5  3.5 - 5.1 mmol/L Final   Chloride 04/26/2021 105  98 - 111 mmol/L Final   CO2 04/26/2021 32  22 - 32 mmol/L Final   Glucose, Bld 04/26/2021 114 (A) 70 - 99 mg/dL Final   Glucose reference range applies only to samples taken after fasting for at least 8 hours.   BUN 04/26/2021 22  8 - 23 mg/dL Final  Creatinine, Ser 04/26/2021 1.42  (A) 0.44 - 1.00 mg/dL Final   Calcium 04/26/2021 8.6 (A) 8.9 - 10.3 mg/dL Final   Total Protein 04/26/2021 6.6  6.5 - 8.1 g/dL Final   Albumin 04/26/2021 3.4 (A) 3.5 - 5.0 g/dL Final   AST 04/26/2021 13 (A) 15 - 41 U/L Final   ALT 04/26/2021 10  0 - 44 U/L Final   Alkaline Phosphatase 04/26/2021 74  38 - 126 U/L Final   Total Bilirubin 04/26/2021 0.5  0.3 - 1.2 mg/dL Final   GFR, Estimated 04/26/2021 40 (A) >60 mL/min Final   Comment: (NOTE) Calculated using the CKD-EPI Creatinine Equation (2021)    Anion gap 04/26/2021 3 (A) 5 - 15 Final   Performed at [facility], [address] [place], [place] [address]   Lactic Acid, Venous 04/26/2021 0.7  0.5 - 1.9 mmol/L Final   Performed at [facility], [place], [place] [address]   Color, Urine 04/27/2021 YELLOW (A) YELLOW Final   APPearance 04/27/2021 CLEAR (A) CLEAR Final   Specific Gravity, Urine 04/27/2021 1.011  1.005 - 1.030 Final   pH 04/27/2021 5.0  5.0 - 8.0 Final   Glucose, UA 04/27/2021 NEGATIVE  NEGATIVE mg/dL Final   Hgb urine dipstick 04/27/2021 NEGATIVE  NEGATIVE Final   Bilirubin Urine 04/27/2021 NEGATIVE  NEGATIVE Final   Ketones, ur 04/27/2021 NEGATIVE  NEGATIVE mg/dL Final   Protein, ur 04/27/2021 NEGATIVE  NEGATIVE mg/dL Final   Nitrite 04/27/2021 NEGATIVE  NEGATIVE Final   Leukocytes,Ua 04/27/2021 NEGATIVE  NEGATIVE Final   RBC / HPF 04/27/2021 0-5  0 - 5 RBC/hpf Final   WBC, UA 04/27/2021 0-5  0 - 5 WBC/hpf Final   Bacteria, UA 04/27/2021 NONE SEEN  NONE SEEN Final   Squamous Epithelial / LPF 04/27/2021 0-5  0 - 5 Final   Performed at [facility], [address] [place], [place] [address]   Troponin I (High Sensitivity) 04/26/2021 5  <18 ng/L Final   Comment: (NOTE) Elevated high sensitivity troponin I (hsTnI) values and significant  changes across serial measurements may suggest ACS but many other  chronic and acute conditions are known to elevate hsTnI results.   Refer to the "Links" section for chest pain algorithms and additional  guidance. Performed at [facility], [place], [place] [address]    Sodium 04/27/2021 141  135 - 145 mmol/L Final   Potassium 04/27/2021 4.1  3.5 - 5.1 mmol/L Final   Chloride 04/27/2021 105  98 - 111 mmol/L Final   CO2 04/27/2021 30  22 - 32 mmol/L Final   Glucose, Bld 04/27/2021 102 (A) 70 - 99 mg/dL Final   Glucose reference range applies only to samples taken after fasting for at least 8 hours.   BUN 04/27/2021 23  8 - 23 mg/dL Final   Creatinine, Ser 04/27/2021 1.14 (A) 0.44 - 1.00 mg/dL Final   Calcium 04/27/2021 8.3 (A) 8.9 - 10.3 mg/dL Final   GFR, Estimated 04/27/2021 52 (A) >60 mL/min Final   Comment: (NOTE) Calculated using the CKD-EPI Creatinine Equation (2021)    Anion gap 04/27/2021 6  5 - 15 Final   Performed at [facility], [place], [place] [address]   WBC 04/27/2021 8.9  4.0 - 10.5 K/uL Final   RBC 04/27/2021 3.71 (A) 3.87 - 5.11 MIL/uL Final   Hemoglobin 04/27/2021 10.1 (A) 12.0 - 15.0 g/dL Final   HCT 04/27/2021 31.8 (A)  36.0 - 46.0 % Final   MCV 04/27/2021 85.7  80.0 - 100.0 fL Final   MCH 04/27/2021 27.2  26.0 - 34.0 pg Final   MCHC 04/27/2021 31.8  30.0 - 36.0 g/dL Final   RDW 04/27/2021 14.7  11.5 - 15.5 % Final   Platelets 04/27/2021 263  150 - 400 K/uL Final   nRBC 04/27/2021 0.0  0.0 - 0.2 % Final   Performed at [facility], [place], [place] [address]   Hgb A1c MFr Bld 04/27/2021 5.7 (A) 4.8 - 5.6 % Final   Comment: (NOTE)         Prediabetes: 5.7 - 6.4         Diabetes: >6.4         Glycemic control for adults with diabetes: <7.0    Mean Plasma Glucose 04/27/2021 117  mg/dL Final   Comment: (NOTE) Performed At: [facility] [place], [place] [id number] Rush Farmer MD [id number]    Cholesterol 04/27/2021 132  0 - 200 mg/dL Final   Triglycerides 04/27/2021 52  <150 mg/dL  Final   HDL 04/27/2021 65  >40 mg/dL Final   Total CHOL/HDL Ratio 04/27/2021 2.0  RATIO Final   VLDL 04/27/2021 10  0 - 40 mg/dL Final   LDL Cholesterol 04/27/2021 57  0 - 99 mg/dL Final   Comment:        Total Cholesterol/HDL:CHD Risk Coronary Heart Disease Risk Table                     Men   Women  1/2 Average Risk   3.4   3.3  Average Risk       5.0   4.4  2 X Average Risk   9.6   7.1  3 X Average Risk  23.4   11.0        Use the calculated Patient Ratio above and the CHD Risk Table to determine the patient's CHD Risk.        ATP III CLASSIFICATION (LDL):  <100     mg/dL   Optimal  100-129  mg/dL   Near or Above                    Optimal  130-159  mg/dL   Borderline  160-189  mg/dL   High  >190     mg/dL   Very High Performed at [facility], [address] [place], [place] [address]    S' Lateral 04/27/2021 2.72  cm Final   SARS Coronavirus 2 by RT PCR 04/27/2021 NEGATIVE  NEGATIVE Final   Comment: (NOTE) SARS-CoV-2 target nucleic acids are NOT DETECTED.  The SARS-CoV-2 RNA is generally detectable in upper respiratory specimens during the acute phase of infection. The lowest concentration of SARS-CoV-2 viral copies this assay can detect is 138 copies/mL. A negative result does not preclude SARS-Cov-2 infection and should not be used as the sole basis for treatment or other patient management decisions. A negative result may occur with  improper specimen collection/handling, submission of specimen other than nasopharyngeal swab, presence of viral mutation(s) within the areas targeted by this assay, and inadequate number of viral copies(<138 copies/mL). A negative result must be combined with clinical observations, patient history, and epidemiological information. The expected result is Negative.  Fact Sheet for Patients:  EntrepreneurPulse.com.au  Fact Sheet for Healthcare Providers:  IncredibleEmployment.be  This  test is no  t yet approved or cleared by the [place] and  has been authorized for detection and/or diagnosis of SARS-CoV-2 by FDA under an Emergency Use Authorization (EUA). This EUA will remain  in effect (meaning this test can be used) for the duration of the COVID-19 declaration under Section 564(b)(1) of the Act, 21 U.S.C.section 360bbb-3(b)(1), unless the authorization is terminated  or revoked sooner.       Influenza A by PCR 04/27/2021 NEGATIVE  NEGATIVE Final   Influenza B by PCR 04/27/2021 NEGATIVE  NEGATIVE Final   Comment: (NOTE) The Xpert Xpress SARS-CoV-2/FLU/RSV plus assay is intended as an aid in the diagnosis of influenza from Nasopharyngeal swab specimens and should not be used as a sole basis for treatment. Nasal washings and aspirates are unacceptable for Xpert Xpress SARS-CoV-2/FLU/RSV testing.  Fact Sheet for Patients: EntrepreneurPulse.com.au  Fact Sheet for Healthcare Providers: IncredibleEmployment.be  This test is not yet approved or cleared by the [place] FDA and has been authorized for detection and/or diagnosis of SARS-CoV-2 by FDA under an Emergency Use Authorization (EUA). This EUA will remain in effect (meaning this test can be used) for the duration of the COVID-19 declaration under Section 564(b)(1) of the Act, 21 U.S.C. section 360bbb-3(b)(1), unless the authorization is terminated or revoked.  Performed at [facility], [address] [place], [place] [address]    Glucose-Capillary 04/27/2021 104 (A) 70 - 99 mg/dL Final   Glucose reference range applies only to samples taken after fasting for at least 8 hours.   Glucose-Capillary 04/27/2021 91  70 - 99 mg/dL Final   Glucose reference range applies only to samples taken after fasting for at least 8 hours.   Comment 1 04/27/2021 Notify RN   Final   Glucose-Capillary 04/28/2021 75  70 - 99 mg/dL Final    Glucose reference range applies only to samples taken after fasting for at least 8 hours.   Glucose-Capillary 04/28/2021 86  70 - 99 mg/dL Final   Glucose reference range applies only to samples taken after fasting for at least 8 hours.   Glucose-Capillary 04/28/2021 94  70 - 99 mg/dL Final   Glucose reference range applies only to samples taken after fasting for at least 8 hours.    STUDIES: No results found.  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  PLAN:    1. 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  Patient has now completed 5 years of letrozole as well.  Her most recent mammogram on June 30, 2021 was reported as BI-RADS 1.  Repeat screening mammogram in August 2023.  He discussion with the patient, determined that no further follow-up is necessary and her primary care physician can continue ordering her yearly mammograms.  Please refer patient back if there are any questions or concerns.   2.  Osteopenia: Patient's most recent bone mineral density on June 25, 2020 revealed a T score of -1.0.  Which is considered normal.  His improvement of her bone mineral density 1 year prior where her T score was poor to -1.3.  Continue calcium and vitamin D supplementation.  Further bone densities will be ordered by primary care. She will be due in approximately August 2023.    I spent a total of 20 minutes reviewing  chart data, face-to-face evaluation with the patient, counseling and coordination of care as detailed above.   Patient expressed understanding and was in agreement with this plan. She also understands that She can call clinic at any time with any questions, concerns, or complaints.    Lloyd Huger, MD   07/16/2021 5:54 AM

## 2021-07-14 ENCOUNTER — Inpatient Hospital Stay: Payer: Medicare HMO | Attending: Oncology | Admitting: Oncology

## 2021-07-14 ENCOUNTER — Encounter: Payer: Self-pay | Admitting: Oncology

## 2021-07-14 VITALS — BP 122/63 | HR 61 | Temp 98.2°F | Resp 16 | Wt 206.9 lb

## 2021-07-14 DIAGNOSIS — I10 Essential (primary) hypertension: Secondary | ICD-10-CM | POA: Insufficient documentation

## 2021-07-14 DIAGNOSIS — Z7982 Long term (current) use of aspirin: Secondary | ICD-10-CM | POA: Insufficient documentation

## 2021-07-14 DIAGNOSIS — Z170 Estrogen receptor positive status [ER+]: Secondary | ICD-10-CM | POA: Insufficient documentation

## 2021-07-14 DIAGNOSIS — M8580 Other specified disorders of bone density and structure, unspecified site: Secondary | ICD-10-CM | POA: Diagnosis not present

## 2021-07-14 DIAGNOSIS — C50511 Malignant neoplasm of lower-outer quadrant of right female breast: Secondary | ICD-10-CM | POA: Diagnosis not present

## 2021-07-14 DIAGNOSIS — Z7984 Long term (current) use of oral hypoglycemic drugs: Secondary | ICD-10-CM | POA: Insufficient documentation

## 2021-07-14 DIAGNOSIS — Z79811 Long term (current) use of aromatase inhibitors: Secondary | ICD-10-CM | POA: Insufficient documentation

## 2021-07-14 DIAGNOSIS — Z79899 Other long term (current) drug therapy: Secondary | ICD-10-CM | POA: Insufficient documentation

## 2021-07-14 DIAGNOSIS — Z8673 Personal history of transient ischemic attack (TIA), and cerebral infarction without residual deficits: Secondary | ICD-10-CM | POA: Insufficient documentation

## 2021-07-14 NOTE — Progress Notes (Signed)
Patient is no longer taking Letrozole.

## 2021-07-27 ENCOUNTER — Ambulatory Visit (INDEPENDENT_AMBULATORY_CARE_PROVIDER_SITE_OTHER): Payer: Medicare HMO

## 2021-07-27 ENCOUNTER — Other Ambulatory Visit: Payer: Self-pay

## 2021-07-27 ENCOUNTER — Ambulatory Visit: Payer: Medicare HMO | Admitting: Cardiology

## 2021-07-27 ENCOUNTER — Encounter: Payer: Self-pay | Admitting: Cardiology

## 2021-07-27 VITALS — BP 100/60 | HR 62 | Ht 63.0 in | Wt 201.0 lb

## 2021-07-27 DIAGNOSIS — I639 Cerebral infarction, unspecified: Secondary | ICD-10-CM

## 2021-07-27 DIAGNOSIS — R600 Localized edema: Secondary | ICD-10-CM

## 2021-07-27 DIAGNOSIS — I10 Essential (primary) hypertension: Secondary | ICD-10-CM

## 2021-07-27 MED ORDER — LISINOPRIL 10 MG PO TABS: 10.0000 mg | ORAL_TABLET | Freq: Every day | ORAL | 3 refills | Status: DC

## 2021-07-27 MED ORDER — FUROSEMIDE 40 MG PO TABS: 20.0000 mg | ORAL_TABLET | Freq: Every day | ORAL | 3 refills | Status: DC

## 2021-07-27 NOTE — Progress Notes (Signed)
Cardiology Office Note:    Date:  07/27/2021   ID:  Elizabeth [name], DOB Apr 18, 1951, MRN [id number]  PCP:  Marguerita Merles, MD   [facility] Providers Cardiologist:  Kate Sable, MD     Referring MD: Bunnie Pion, FNP   Chief Complaint  Patient presents with   New Patient (Initial Visit)    Referred by PCP for CVA, Hypertension and Hyperlipidema. Patient c.o SOB and Swelling in ankles. Meds reviewed verbally with patient.     History of Present Illness:    Elizabeth [name] is a 70 y.o. 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  Patient states having lower extremity edema for years, this is worsened over the past several months.  Has been taking Lasix 20 mg daily with some improvement.  Recently admitted to the hospital due to a TIA, MRI did not show any acute findings.  She had a stroke in the early 90s with residual left-sided defect.  Currently takes Plavix and Lipitor.  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  Echo 04/2021 obtained for TIA showed normal systolic and diastolic function, EF 60 to 65%.  Was seen by cardiology during hospital visit, EKG and monitor did not reveal any evidence for atrial fibrillation.  Past Medical History:  Diagnosis Date   Cancer [facility])    breast   Carcinoma of right breast ([place]) 05/20/2014   Diabetes mellitus without complication ([place])    Hypertension    Lymphedema    Personal history of radiation therapy 2015   Right breast   PONV (postoperative nausea and vomiting)    Psoriasis 1990   Stroke ([place]) 2000   residual left arm weakness   Varicose veins     Past Surgical History:  Procedure Laterality Date   AMPUTATION Left 03/04/2021   Procedure: AMPUTATION RAY;  Surgeon: Sharlotte Alamo, DPM;  Location: [facility] ORS;  Service: Podiatry;  Laterality: Left;   AMPUTATION TOE Right  01/25/2017   Procedure: AMPUTATION TOE   ;  Surgeon: Albertine Patricia, DPM;  Location: [facility] OR[facility];  Service: Podiatry;  Laterality: Right;   BREAST EXCISIONAL BIOPSY Right 2015   [facility] / with radiation   BREAST LUMPECTOMY Right 04-2014   followed by radiation,  no chemo   BREAST REDUCTION SURGERY  1977   CHOLECYSTECTOMY N/A 09/23/2015   Procedure: LAPAROSCOPIC CHOLECYSTECTOMY;  Surgeon: Bonner Puna, MD;  Location: [facility] ORS;  Service: General;  Laterality: N/A;   DILATION AND CURETTAGE OF UTERUS  2010   EYE SURGERY Left 2015   cataracts and left eye detached retina repair   EYE SURGERY Right 2013   cataract with len implant   GASTRIC ROUX-EN-Y N/A 09/23/2015   Procedure: LAPAROSCOPIC ROUX-EN-Y GASTRIC;  Surgeon: Bonner Puna, MD;  Location: [facility] ORS;  Service: General;  Laterality: N/A;   HIATAL HERNIA REPAIR N/A 09/23/2015   Procedure: LAPAROSCOPIC REPAIR OF HIATAL HERNIA;  Surgeon: Bonner Puna, MD;  Location: [facility];  Service: General;  Laterality: N/A;   IRRIGATION AND DEBRIDEMENT FOOT Right 01/25/2017   Procedure: IRRIGATION AND DEBRIDEMENT FOOT;  Surgeon: Albertine Patricia, DPM;  Location: [facility] OR[facility];  Service: Podiatry;  Laterality: Right;   IRRIGATION AND DEBRIDEMENT FOOT Right 01/29/2017   Procedure: IRRIGATION AND DEBRIDEMENT FOOT and application of wound vac;  Surgeon: Albertine Patricia, DPM;  Location: [facility];  Service:  Podiatry;  Laterality: Right;   LOWER EXTREMITY ANGIOGRAPHY Right 01/28/2017   Procedure: Lower Extremity Angiography;  Surgeon: Katha Cabal, MD;  Location: [place] [facility];  Service: Cardiovascular;  Laterality: Right;   LOWER EXTREMITY ANGIOGRAPHY Left 03/06/2021   Procedure: Lower Extremity Angiography;  Surgeon: Katha Cabal, MD;  Location: [place] [facility];  Service: Cardiovascular;  Laterality: Left;   LOWER EXTREMITY INTERVENTION  01/28/2017   Procedure: Lower Extremity Intervention;  Surgeon: Katha Cabal, MD;  Location: [place] [facility];   Service: Cardiovascular;;   REDUCTION MAMMAPLASTY      Current Medications: Current Meds  Medication Sig   acetaminophen (TYLENOL) 325 MG tablet Take 2 tablets (650 mg total) by mouth every 6 (six) hours as needed for mild pain (or Fever >/= 101).   albuterol (PROVENTIL HFA;VENTOLIN HFA) 108 (90 BASE) MCG/ACT inhaler Inhale 2 puffs into the lungs every 6 (six) hours as needed for wheezing or shortness of breath.   Calcium Carb-Cholecalciferol (CALCIUM 1000 + D PO) Take 1 tablet by mouth daily.   clopidogrel (PLAVIX) 75 MG tablet Take 75 mg by mouth daily.   gabapentin (NEURONTIN) 300 MG capsule Take 300 mg by mouth 3 (three) times daily.    letrozole (FEMARA) 2.5 MG tablet Take 1 tablet (2.5 mg total) by mouth daily.   linagliptin (TRADJENTA) 5 MG TABS tablet Take 5 mg by mouth daily.   metFORMIN (GLUCOPHAGE) 1000 MG tablet Take 1,000 mg by mouth daily with breakfast.    potassium chloride (KLOR-CON) 10 MEQ tablet Take 10 mEq by mouth daily.   pravastatin (PRAVACHOL) 40 MG tablet Take 40 mg by mouth daily. In afternoon   TRUE METRIX BLOOD GLUCOSE TEST test strip    TRUEPLUS LANCETS 33G MISC    [DISCONTINUED] furosemide (LASIX) 20 MG tablet Take 20 mg by mouth daily.   [DISCONTINUED] lisinopril (ZESTRIL) 20 MG tablet Take 20 mg by mouth daily.     Allergies:   Patient has no known allergies.   Social History   Socioeconomic History   Marital status: Married    Spouse name: Not on file   Number of children: Not on file   Years of education: Not on file   Highest education level: Not on file  Occupational History   Not on file  Tobacco Use   Smoking status: Never   Smokeless tobacco: Never  Substance and Sexual Activity   Alcohol use: No    Alcohol/week: 0.0 standard drinks   Drug use: No   Sexual activity: Yes    Birth control/protection: Post-menopausal  Other Topics Concern   Not on file  Social History Narrative   Not on file   Social Determinants of Health    Financial Resource Strain: Not on file  Food Insecurity: Not on file  Transportation Needs: Not on file  Physical Activity: Not on file  Stress: Not on file  Social Connections: Not on file     Family History: The patient's family history includes Breast cancer (age of onset: 75) in her mother; Cancer in her mother; Diabetes in her brother, father, mother, and sister.  ROS:   Please see the history of present illness.     All other systems reviewed and are negative.  EKGs/Labs/Other Studies Reviewed:    The following studies were reviewed today:   EKG:  EKG is  ordered today.  The ekg ordered today demonstrates normal sinus rhythm,  Recent Labs: 03/07/2021: Magnesium 1.8 04/26/2021: ALT 10  04/27/2021: BUN 23; Creatinine, Ser 1.14; Hemoglobin 10.1; Platelets 263; Potassium 4.1; Sodium 141  Recent Lipid Panel    Component Value Date/Time   CHOL 132 04/27/2021 0555   TRIG 52 04/27/2021 0555   HDL 65 04/27/2021 0555   CHOLHDL 2.0 04/27/2021 0555   VLDL 10 04/27/2021 0555   LDLCALC 57 04/27/2021 0555     Risk Assessment/Calculations:          Physical Exam:    VS:  BP 100/60 (BP Location: Left Arm, Patient Position: Sitting, Cuff Size: Normal)   Pulse 62   Ht '5\' 3"'$  (1.6 m)   Wt 201 lb (91.2 kg)   SpO2 98%   BMI 35.61 kg/m     Wt Readings from Last 3 Encounters:  07/27/21 201 lb (91.2 kg)  07/14/21 206 lb 14.4 oz (93.8 kg)  04/26/21 200 lb (90.7 kg)     GEN:  Well nourished, well developed in no acute distress HEENT: Normal NECK: No JVD; No carotid bruits LYMPHATICS: No lymphadenopathy CARDIAC: RRR, no murmurs, rubs, gallops RESPIRATORY:  Clear to auscultation without rales, wheezing or rhonchi  ABDOMEN: Soft, non-tender, non-distended MUSCULOSKELETAL: 1+ edema, left leg weakness SKIN: Warm and dry NEUROLOGIC:  Alert and oriented x 3 PSYCHIATRIC:  Normal affect   ASSESSMENT:    1. Cerebrovascular accident (CVA), unspecified mechanism ([place])   2.  Primary hypertension   3. Bilateral leg edema    PLAN:    In order of problems listed above:  History of CVA, recent TIA.  Place cardiac monitor x2 weeks to evaluate any significant arrhythmias such as A. fib or flutter.  Continue Plavix, Lipitor. Hypertension, BP low normal, reduce lisinopril to 10 mg daily. Leg edema, increase Lasix to 40 mg daily, obtain BMP in 1 week.  Follow-up in 6 weeks after cardiac monitor.    Medication Adjustments/Labs and Tests Ordered: Current medicines are reviewed at length with the patient today.  Concerns regarding medicines are outlined above.  Orders Placed This Encounter  Procedures   Basic metabolic panel   LONG TERM MONITOR (3-14 DAYS)   EKG 12-Lead   Meds ordered this encounter  Medications   furosemide (LASIX) 40 MG tablet    Sig: Take 0.5 tablets (20 mg total) by mouth daily.    Dispense:  30 tablet    Refill:  3   lisinopril (ZESTRIL) 10 MG tablet    Sig: Take 1 tablet (10 mg total) by mouth daily.    Dispense:  30 tablet    Refill:  3    Patient Instructions  Medication Instructions:   Your physician has recommended you make the following change in your medication:     INCREASE your Lasix (Furosemide) to 40 MG once a day.  2.     DECREASE your Lisinopril to 10 MG once a day.   *If you need a refill on your cardiac medications before your next appointment, please call your pharmacy*   Lab Work:  Your physician recommends that you return for lab work in: 1 [place] return to our office on ______________________at______________________am/pm    Testing/Procedures:  Your physician has recommended that you wear a Zio XT monitor for 2 weeks.   This monitor is a medical device that records the heart's electrical activity. Doctors most often use these monitors to diagnose arrhythmias. Arrhythmias are problems with the speed or rhythm of the heartbeat. The monitor is a small device applied to your chest. You can wear one  while  you do your normal daily activities. While wearing this monitor if you have any symptoms to push the button and record what you felt. Once you have worn this monitor for the period of time provider prescribed (Usually 14 days), you will return the monitor device in the postage paid box. Once it is returned they will download the data collected and provide [place] with a report which the provider will then review and we will call you with those results. Important tips:  Avoid showering during the first 24 hours of wearing the monitor. Avoid excessive sweating to help maximize wear time. Do not submerge the device, no hot tubs, and no swimming pools. Keep any lotions or oils away from the patch. After 24 hours you may shower with the patch on. Take brief showers with your back facing the shower head.  Do not remove patch once it has been placed because that will interrupt data and decrease adhesive wear time. Push the button when you have any symptoms and write down what you were feeling. Once you have completed wearing your monitor, remove and place into box which has postage paid and place in your outgoing mailbox.  If for some reason you have misplaced your box then call our office and we can provide another box and/or mail it off for you.      Follow-Up: At [facility], you and your health needs are our priority.  As part of our continuing mission to provide you with exceptional heart care, we have created designated Provider Care Teams.  These Care Teams include your primary Cardiologist (physician) and Advanced Practice Providers (APPs -  Physician Assistants and Nurse Practitioners) who all work together to provide you with the care you need, when you need it.  We recommend signing up for the patient portal called "MyChart".  Sign up information is provided on this After Visit Summary.  MyChart is used to connect with patients for Virtual Visits (Telemedicine).  Patients are able to view  lab/test results, encounter notes, upcoming appointments, etc.  Non-urgent messages can be sent to your provider as well.   To learn more about what you can do with MyChart, go to [email or web address].    Your next appointment:   6 week(s)  The format for your next appointment:   In Person  Provider:   Kate Sable, MD   Other Instructions    Signed, Kate Sable, MD  07/27/2021 4:46 PM    [place]

## 2021-07-27 NOTE — Patient Instructions (Signed)
Medication Instructions:   Your physician has recommended you make the following change in your medication:     INCREASE your Lasix (Furosemide) to 40 MG once a day.  2.     DECREASE your Lisinopril to 10 MG once a day.   *If you need a refill on your cardiac medications before your next appointment, please call your pharmacy*   Lab Work:  Your physician recommends that you return for lab work in: 1 [place] return to our office on ______________________at______________________am/pm    Testing/Procedures:  Your physician has recommended that you wear a Zio XT monitor for 2 weeks.   This monitor is a medical device that records the heart's electrical activity. Doctors most often use these monitors to diagnose arrhythmias. Arrhythmias are problems with the speed or rhythm of the heartbeat. The monitor is a small device applied to your chest. You can wear one while you do your normal daily activities. While wearing this monitor if you have any symptoms to push the button and record what you felt. Once you have worn this monitor for the period of time provider prescribed (Usually 14 days), you will return the monitor device in the postage paid box. Once it is returned they will download the data collected and provide [place] with a report which the provider will then review and we will call you with those results. Important tips:  Avoid showering during the first 24 hours of wearing the monitor. Avoid excessive sweating to help maximize wear time. Do not submerge the device, no hot tubs, and no swimming pools. Keep any lotions or oils away from the patch. After 24 hours you may shower with the patch on. Take brief showers with your back facing the shower head.  Do not remove patch once it has been placed because that will interrupt data and decrease adhesive wear time. Push the button when you have any symptoms and write down what you were feeling. Once you have completed wearing your  monitor, remove and place into box which has postage paid and place in your outgoing mailbox.  If for some reason you have misplaced your box then call our office and we can provide another box and/or mail it off for you.      Follow-Up: At [facility], you and your health needs are our priority.  As part of our continuing mission to provide you with exceptional heart care, we have created designated Provider Care Teams.  These Care Teams include your primary Cardiologist (physician) and Advanced Practice Providers (APPs -  Physician Assistants and Nurse Practitioners) who all work together to provide you with the care you need, when you need it.  We recommend signing up for the patient portal called "MyChart".  Sign up information is provided on this After Visit Summary.  MyChart is used to connect with patients for Virtual Visits (Telemedicine).  Patients are able to view lab/test results, encounter notes, upcoming appointments, etc.  Non-urgent messages can be sent to your provider as well.   To learn more about what you can do with MyChart, go to [email or web address].    Your next appointment:   6 week(s)  The format for your next appointment:   In Person  Provider:   Kate Sable, MD   Other Instructions

## 2021-08-04 ENCOUNTER — Other Ambulatory Visit: Payer: Self-pay

## 2021-08-04 ENCOUNTER — Other Ambulatory Visit (INDEPENDENT_AMBULATORY_CARE_PROVIDER_SITE_OTHER): Payer: Medicare HMO

## 2021-08-04 DIAGNOSIS — I639 Cerebral infarction, unspecified: Secondary | ICD-10-CM | POA: Diagnosis not present

## 2021-08-05 LAB — BASIC METABOLIC PANEL
BUN/Creatinine Ratio: 18 (ref 12–28)
BUN: 32 mg/dL — ABNORMAL HIGH (ref 8–27)
CO2: 22 mmol/L (ref 20–29)
Calcium: 8.8 mg/dL (ref 8.7–10.3)
Chloride: 99 mmol/L (ref 96–106)
Creatinine, Ser: 1.75 mg/dL — ABNORMAL HIGH (ref 0.57–1.00)
Glucose: 189 mg/dL — ABNORMAL HIGH (ref 70–99)
Potassium: 4.4 mmol/L (ref 3.5–5.2)
Sodium: 139 mmol/L (ref 134–144)
eGFR: 31 mL/min/{1.73_m2} — ABNORMAL LOW (ref >59)

## 2021-08-07 ENCOUNTER — Telehealth: Payer: Self-pay

## 2021-08-07 DIAGNOSIS — E876 Hypokalemia: Secondary | ICD-10-CM

## 2021-08-07 NOTE — Telephone Encounter (Signed)
Called patient and was unable to leave a message on 2 different numbers. Will try to call again Monday 08/10/21  Creatinine worse with increasing Lasix.  Reduce Lasix to 20 mg daily, repeat BMP in 2 weeks.

## 2021-08-07 NOTE — Telephone Encounter (Signed)
-----   Message from Kate Sable, MD sent at 08/07/2021  8:24 AM EDT ----- Creatinine worse with increasing Lasix.  Reduce Lasix to 20 mg daily, repeat BMP in 2 weeks.  Thank you

## 2021-08-10 DIAGNOSIS — I639 Cerebral infarction, unspecified: Secondary | ICD-10-CM

## 2021-08-10 NOTE — Telephone Encounter (Signed)
Called and spoke with patient. Informed her of the result note below. She stated that she will decrease her Lasix down to 20 MG daily and we scheduled her for a lab/BMP draw in office in 2 weeks on 08/25/21.  Patient verbalized understanding and agreed with plan.

## 2021-08-25 ENCOUNTER — Other Ambulatory Visit: Payer: Self-pay

## 2021-08-25 ENCOUNTER — Other Ambulatory Visit (INDEPENDENT_AMBULATORY_CARE_PROVIDER_SITE_OTHER): Payer: Medicare HMO

## 2021-08-25 DIAGNOSIS — E876 Hypokalemia: Secondary | ICD-10-CM

## 2021-08-26 ENCOUNTER — Other Ambulatory Visit: Payer: Self-pay

## 2021-08-26 LAB — BASIC METABOLIC PANEL
BUN/Creatinine Ratio: 17 (ref 12–28)
BUN: 28 mg/dL — ABNORMAL HIGH (ref 8–27)
CO2: 21 mmol/L (ref 20–29)
Calcium: 9.1 mg/dL (ref 8.7–10.3)
Chloride: 109 mmol/L — ABNORMAL HIGH (ref 96–106)
Creatinine, Ser: 1.69 mg/dL — ABNORMAL HIGH (ref 0.57–1.00)
Glucose: 85 mg/dL (ref 70–99)
Potassium: 5.4 mmol/L — ABNORMAL HIGH (ref 3.5–5.2)
Sodium: 145 mmol/L — ABNORMAL HIGH (ref 134–144)
eGFR: 32 mL/min/{1.73_m2} — ABNORMAL LOW (ref >59)

## 2021-08-26 NOTE — Progress Notes (Signed)
See result note for BMP on 08/26/21. Discontinued Potassium. Called patient and faxed lab results to patients PCP as requested.

## 2021-08-28 ENCOUNTER — Telehealth: Payer: Self-pay | Admitting: Cardiology

## 2021-08-28 NOTE — Telephone Encounter (Signed)
Kate Sable, MD  08/27/2021  5:52 PM EDT     No evidence for atrial fibrillation or atrial flutter on cardiac monitor.

## 2021-08-28 NOTE — Telephone Encounter (Signed)
I called and spoke with the patient regarding her heart monitor results.  She voices understanding and is agreeable.

## 2021-08-28 NOTE — Telephone Encounter (Signed)
Attempted to call the patient. No answer- I left a message to please call back.  

## 2021-08-28 NOTE — Telephone Encounter (Signed)
Patient returning call.

## 2021-09-07 ENCOUNTER — Ambulatory Visit: Payer: Medicare HMO | Admitting: Cardiology

## 2021-09-07 ENCOUNTER — Encounter: Payer: Self-pay | Admitting: Cardiology

## 2021-09-07 ENCOUNTER — Other Ambulatory Visit: Payer: Self-pay

## 2021-09-07 VITALS — BP 110/60 | HR 68 | Ht 63.0 in | Wt 202.0 lb

## 2021-09-07 DIAGNOSIS — I10 Essential (primary) hypertension: Secondary | ICD-10-CM

## 2021-09-07 DIAGNOSIS — I639 Cerebral infarction, unspecified: Secondary | ICD-10-CM | POA: Diagnosis not present

## 2021-09-07 DIAGNOSIS — R600 Localized edema: Secondary | ICD-10-CM

## 2021-09-07 NOTE — Patient Instructions (Signed)
Medication Instructions:   Your physician recommends that you continue on your current medications as directed. Please refer to the Current Medication list given to you today.  *If you need a refill on your cardiac medications before your next appointment, please call your pharmacy*   Lab Work:  Your physician recommends that you return for lab work in: 3 months at our office.    If you have labs (blood work) drawn today and your tests are completely normal, you will receive your results only by: [place] (if you have MyChart) OR A paper copy in the mail If you have any lab test that is abnormal or we need to change your treatment, we will call you to review the results.   Testing/Procedures:  None Ordered  Follow-Up: At [facility], you and your health needs are our priority.  As part of our continuing mission to provide you with exceptional heart care, we have created designated Provider Care Teams.  These Care Teams include your primary Cardiologist (physician) and Advanced Practice Providers (APPs -  Physician Assistants and Nurse Practitioners) who all work together to provide you with the care you need, when you need it.  We recommend signing up for the patient portal called "MyChart".  Sign up information is provided on this After Visit Summary.  MyChart is used to connect with patients for Virtual Visits (Telemedicine).  Patients are able to view lab/test results, encounter notes, upcoming appointments, etc.  Non-urgent messages can be sent to your provider as well.   To learn more about what you can do with MyChart, go to [email or web address].    Your next appointment:   6 month(s)  The format for your next appointment:   In Person  Provider:   You may see Kate Sable, MD or one of the following Advanced Practice Providers on your designated Care Team:   Murray Hodgkins, NP Christell Faith, PA-C Marrianne Mood, PA-C [place], [place]

## 2021-09-07 NOTE — Progress Notes (Signed)
Cardiology Office Note:    Date:  09/07/2021   ID:  Elizabeth [name], DOB 1951/01/14, MRN [id number]  PCP:  Marguerita Merles, MD   [facility] Cardiologist:  Kate Sable, MD     Referring MD: Marguerita Merles, MD   Chief Complaint  Patient presents with   Other    F/u zio. Meds reviewed verbally with pt.     History of Present Illness:    Elizabeth [name] is a 70 y.o. 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  She was last seen due to lower extremity edema and CVA.  Lasix dose was increased to 40 mg daily, cardiac monitor was placed to evaluate any significant arrhythmias such as A. fib or flutter contributing to CVA.  Creatinine function did worsen with increased dose of Lasix.  Lasix was decreased to 20 mg daily.  She still has edema, denies palpitations.  Prior notes Echo 04/2021 showed normal systolic and diastolic function, EF 60 to 65%.   Past Medical History:  Diagnosis Date   Cancer [facility])    breast   Carcinoma of right breast ([place]) 05/20/2014   Diabetes mellitus without complication ([place])    Hypertension    Lymphedema    Personal history of radiation therapy 2015   Right breast   PONV (postoperative nausea and vomiting)    Psoriasis 1990   Stroke ([place]) 2000   residual left arm weakness   Varicose veins     Past Surgical History:  Procedure Laterality Date   AMPUTATION Left 03/04/2021   Procedure: AMPUTATION RAY;  Surgeon: Sharlotte Alamo, DPM;  Location: [facility];  Service: Podiatry;  Laterality: Left;   AMPUTATION TOE Right 01/25/2017   Procedure: AMPUTATION TOE   ;  Surgeon: Albertine Patricia, DPM;  Location: [facility];  Service: Podiatry;  Laterality: Right;   BREAST EXCISIONAL BIOPSY Right 2015   [facility] / with radiation   BREAST LUMPECTOMY Right 04-2014   followed by radiation,  no chemo   BREAST REDUCTION SURGERY  1977   CHOLECYSTECTOMY  N/A 09/23/2015   Procedure: LAPAROSCOPIC CHOLECYSTECTOMY;  Surgeon: Bonner Puna, MD;  Location: [facility];  Service: General;  Laterality: N/A;   DILATION AND CURETTAGE OF UTERUS  2010   EYE SURGERY Left 2015   cataracts and left eye detached retina repair   EYE SURGERY Right 2013   cataract with len implant   GASTRIC ROUX-EN-Y N/A 09/23/2015   Procedure: LAPAROSCOPIC ROUX-EN-Y GASTRIC;  Surgeon: Bonner Puna, MD;  Location: [facility];  Service: General;  Laterality: N/A;   HIATAL HERNIA REPAIR N/A 09/23/2015   Procedure: LAPAROSCOPIC REPAIR OF HIATAL HERNIA;  Surgeon: Bonner Puna, MD;  Location: [facility];  Service: General;  Laterality: N/A;   IRRIGATION AND DEBRIDEMENT FOOT Right 01/25/2017   Procedure: IRRIGATION AND DEBRIDEMENT FOOT;  Surgeon: Albertine Patricia, DPM;  Location: [facility];  Service: Podiatry;  Laterality: Right;   IRRIGATION AND DEBRIDEMENT FOOT Right 01/29/2017   Procedure: IRRIGATION AND DEBRIDEMENT FOOT and application of wound vac;  Surgeon: Albertine Patricia, DPM;  Location: [facility];  Service: Podiatry;  Laterality: Right;   LOWER EXTREMITY ANGIOGRAPHY Right 01/28/2017   Procedure: Lower Extremity Angiography;  Surgeon: Katha Cabal, MD;  Location: [place] [facility];  Service: Cardiovascular;  Laterality: Right;   LOWER EXTREMITY ANGIOGRAPHY Left 03/06/2021   Procedure: Lower Extremity Angiography;  Surgeon: Hortencia Pilar  G, MD;  Location: [place] [facility];  Service: Cardiovascular;  Laterality: Left;   LOWER EXTREMITY INTERVENTION  01/28/2017   Procedure: Lower Extremity Intervention;  Surgeon: Katha Cabal, MD;  Location: [place] [facility];  Service: Cardiovascular;;   REDUCTION MAMMAPLASTY      Current Medications: Current Meds  Medication Sig   acetaminophen (TYLENOL) 325 MG tablet Take 2 tablets (650 mg total) by mouth every 6 (six) hours as needed for mild pain (or Fever >/= 101).   albuterol (PROVENTIL HFA;VENTOLIN HFA) 108 (90 BASE) MCG/ACT  inhaler Inhale 2 puffs into the lungs every 6 (six) hours as needed for wheezing or shortness of breath.   Calcium Carb-Cholecalciferol (CALCIUM 1000 + D PO) Take 1 tablet by mouth daily.   clopidogrel (PLAVIX) 75 MG tablet Take 75 mg by mouth daily.   escitalopram (LEXAPRO) 10 MG tablet Take 10 mg by mouth daily.   furosemide (LASIX) 40 MG tablet Take 0.5 tablets (20 mg total) by mouth daily.   gabapentin (NEURONTIN) 300 MG capsule Take 300 mg by mouth 3 (three) times daily.    linagliptin (TRADJENTA) 5 MG TABS tablet Take 5 mg by mouth daily.   lisinopril (ZESTRIL) 10 MG tablet Take 1 tablet (10 mg total) by mouth daily.   metFORMIN (GLUCOPHAGE) 1000 MG tablet Take 1,000 mg by mouth daily with breakfast.    PARoxetine (PAXIL) 40 MG tablet Take 40 mg by mouth daily.   pravastatin (PRAVACHOL) 40 MG tablet Take 40 mg by mouth daily. In afternoon   triamcinolone ointment (KENALOG) 0.5 % Apply 1 application topically 2 (two) times daily.   TRUE METRIX BLOOD GLUCOSE TEST test strip    TRUEPLUS LANCETS 33G MISC      Allergies:   Patient has no known allergies.   Social History   Socioeconomic History   Marital status: Married    Spouse name: Not on file   Number of children: Not on file   Years of education: Not on file   Highest education level: Not on file  Occupational History   Not on file  Tobacco Use   Smoking status: Never   Smokeless tobacco: Never  Substance and Sexual Activity   Alcohol use: No    Alcohol/week: 0.0 standard drinks   Drug use: No   Sexual activity: Yes    Birth control/protection: Post-menopausal  Other Topics Concern   Not on file  Social History Narrative   Not on file   Social Determinants of Health   Financial Resource Strain: Not on file  Food Insecurity: Not on file  Transportation Needs: Not on file  Physical Activity: Not on file  Stress: Not on file  Social Connections: Not on file     Family History: The patient's family history  includes Breast cancer (age of onset: 46) in her mother; Cancer in her mother; Diabetes in her brother, father, mother, and sister.  ROS:   Please see the history of present illness.     All other systems reviewed and are negative.  EKGs/Labs/Other Studies Reviewed:    The following studies were reviewed today:   EKG:  EKG not ordered today.   Recent Labs: 03/07/2021: Magnesium 1.8 04/26/2021: ALT 10 04/27/2021: Hemoglobin 10.1; Platelets 263 08/25/2021: BUN 28; Creatinine, Ser 1.69; Potassium 5.4; Sodium 145  Recent Lipid Panel    Component Value Date/Time   CHOL 132 04/27/2021 0555   TRIG 52 04/27/2021 0555   HDL 65 04/27/2021 0555   CHOLHDL  2.0 04/27/2021 0555   VLDL 10 04/27/2021 0555   LDLCALC 57 04/27/2021 0555     Risk Assessment/Calculations:          Physical Exam:    VS:  BP 110/60 (BP Location: Left Arm, Patient Position: Sitting, Cuff Size: Normal)   Pulse 68   Ht 5\' 3"  (1.6 m)   Wt 202 lb (91.6 kg)   SpO2 99%   BMI 35.78 kg/m     Wt Readings from Last 3 Encounters:  09/07/21 202 lb (91.6 kg)  07/27/21 201 lb (91.2 kg)  07/14/21 206 lb 14.4 oz (93.8 kg)     GEN:  Well nourished, well developed in no acute distress HEENT: Normal NECK: No JVD; No carotid bruits LYMPHATICS: No lymphadenopathy CARDIAC: RRR, no murmurs, rubs, gallops RESPIRATORY:  Clear to auscultation without rales, wheezing or rhonchi  ABDOMEN: Soft, non-tender, non-distended MUSCULOSKELETAL: 1+ edema, left leg weakness SKIN: Warm and dry NEUROLOGIC:  Alert and oriented x 3 PSYCHIATRIC:  Normal affect   ASSESSMENT:    1. Cerebrovascular accident (CVA), unspecified mechanism ([place])   2. Primary hypertension   3. Bilateral leg edema     PLAN:    In order of problems listed above:  History of CVA, recent TIA.   Echo with preserved EF, no evidence for atrial shunting.  Cardiac monitor with no evidence for atrial fibrillation or atrial flutter.  Continue Plavix,  Pravachol Hypertension, BP normal, continue lisinopril to 10 mg daily. Leg edema, leg raising advised, continue Lasix 20 mg daily, BMP.  Follow-up in 6 months    Medication Adjustments/Labs and Tests Ordered: Current medicines are reviewed at length with the patient today.  Concerns regarding medicines are outlined above.  Orders Placed This Encounter  Procedures   Basic Metabolic Panel (BMET)    No orders of the defined types were placed in this encounter.   Patient Instructions  Medication Instructions:   Your physician recommends that you continue on your current medications as directed. Please refer to the Current Medication list given to you today.  *If you need a refill on your cardiac medications before your next appointment, please call your pharmacy*   Lab Work:  Your physician recommends that you return for lab work in: 3 months at our office.    If you have labs (blood work) drawn today and your tests are completely normal, you will receive your results only by: [place] (if you have MyChart) OR A paper copy in the mail If you have any lab test that is abnormal or we need to change your treatment, we will call you to review the results.   Testing/Procedures:  None Ordered  Follow-Up: At [facility], you and your health needs are our priority.  As part of our continuing mission to provide you with exceptional heart care, we have created designated Provider Care Teams.  These Care Teams include your primary Cardiologist (physician) and Advanced Practice Providers (APPs -  Physician Assistants and Nurse Practitioners) who all work together to provide you with the care you need, when you need it.  We recommend signing up for the patient portal called "MyChart".  Sign up information is provided on this After Visit Summary.  MyChart is used to connect with patients for Virtual Visits (Telemedicine).  Patients are able to view lab/test results, encounter notes,  upcoming appointments, etc.  Non-urgent messages can be sent to your provider as well.   To learn more about what you can do with  MyChart, go to [email or web address].    Your next appointment:   6 month(s)  The format for your next appointment:   In Person  Provider:   You may see Kate Sable, MD or one of the following Advanced Practice Providers on your designated Care Team:   Murray Hodgkins, NP Christell Faith, PA-C Marrianne Mood, PA-C Cadence Ripley, [place]    Signed, Kate Sable, MD  09/07/2021 4:51 PM    [place]

## 2021-09-28 ENCOUNTER — Ambulatory Visit (INDEPENDENT_AMBULATORY_CARE_PROVIDER_SITE_OTHER): Payer: Medicare HMO | Admitting: Vascular Surgery

## 2021-09-28 ENCOUNTER — Other Ambulatory Visit: Payer: Self-pay

## 2021-09-28 ENCOUNTER — Ambulatory Visit (INDEPENDENT_AMBULATORY_CARE_PROVIDER_SITE_OTHER): Payer: Medicare HMO

## 2021-09-28 ENCOUNTER — Encounter (INDEPENDENT_AMBULATORY_CARE_PROVIDER_SITE_OTHER): Payer: Self-pay | Admitting: Vascular Surgery

## 2021-09-28 ENCOUNTER — Other Ambulatory Visit (INDEPENDENT_AMBULATORY_CARE_PROVIDER_SITE_OTHER): Payer: Self-pay | Admitting: Nurse Practitioner

## 2021-09-28 VITALS — BP 122/74 | HR 69 | Resp 16 | Wt 208.0 lb

## 2021-09-28 DIAGNOSIS — I872 Venous insufficiency (chronic) (peripheral): Secondary | ICD-10-CM | POA: Diagnosis not present

## 2021-09-28 DIAGNOSIS — I890 Lymphedema, not elsewhere classified: Secondary | ICD-10-CM

## 2021-09-28 DIAGNOSIS — I10 Essential (primary) hypertension: Secondary | ICD-10-CM | POA: Diagnosis not present

## 2021-09-28 DIAGNOSIS — E1151 Type 2 diabetes mellitus with diabetic peripheral angiopathy without gangrene: Secondary | ICD-10-CM

## 2021-09-28 DIAGNOSIS — I70249 Atherosclerosis of native arteries of left leg with ulceration of unspecified site: Secondary | ICD-10-CM

## 2021-09-28 DIAGNOSIS — I739 Peripheral vascular disease, unspecified: Secondary | ICD-10-CM

## 2021-09-28 DIAGNOSIS — E782 Mixed hyperlipidemia: Secondary | ICD-10-CM

## 2021-10-10 ENCOUNTER — Encounter (INDEPENDENT_AMBULATORY_CARE_PROVIDER_SITE_OTHER): Payer: Self-pay | Admitting: Vascular Surgery

## 2021-10-10 DIAGNOSIS — I890 Lymphedema, not elsewhere classified: Secondary | ICD-10-CM | POA: Insufficient documentation

## 2021-10-10 NOTE — Progress Notes (Signed)
MRN : [id number]  Elizabeth [name] is a 70 y.o. (Jul 26, 1951) female who presents with chief complaint of leg swelling.  History of Present Illness:   The patient returns to the office for followup evaluation regarding leg swelling.  The swelling has persisted and the pain associated with swelling continues. There have not been any interval development of a ulcerations or wounds.  Since the previous visit the patient has been wearing graduated compression stockings and has noted little if any improvement in the lymphedema. The patient has been using compression routinely morning until night.  The patient also states elevation during the day and exercise is being done too.   ABI's Rt=[place] and Left=[place] (triphasic signals bilateral DP)  TBI's are normal  Current Meds  Medication Sig   acetaminophen (TYLENOL) 325 MG tablet Take 2 tablets (650 mg total) by mouth every 6 (six) hours as needed for mild pain (or Fever >/= 101).   albuterol (PROVENTIL HFA;VENTOLIN HFA) 108 (90 BASE) MCG/ACT inhaler Inhale 2 puffs into the lungs every 6 (six) hours as needed for wheezing or shortness of breath.   Calcium Carb-Cholecalciferol (CALCIUM 1000 + D PO) Take 1 tablet by mouth daily.   clopidogrel (PLAVIX) 75 MG tablet Take 75 mg by mouth daily.   escitalopram (LEXAPRO) 10 MG tablet Take 10 mg by mouth daily.   furosemide (LASIX) 40 MG tablet Take 0.5 tablets (20 mg total) by mouth daily.   gabapentin (NEURONTIN) 300 MG capsule Take 300 mg by mouth 3 (three) times daily.    linagliptin (TRADJENTA) 5 MG TABS tablet Take 5 mg by mouth daily.   lisinopril (ZESTRIL) 10 MG tablet Take 1 tablet (10 mg total) by mouth daily.   metFORMIN (GLUCOPHAGE) 1000 MG tablet Take 1,000 mg by mouth daily with breakfast.    PARoxetine (PAXIL) 40 MG tablet Take 40 mg by mouth daily.   pravastatin (PRAVACHOL) 40 MG tablet Take 40 mg by mouth daily. In afternoon   triamcinolone ointment (KENALOG) 0.5 % Apply 1 application  topically 2 (two) times daily.   TRUE METRIX BLOOD GLUCOSE TEST test strip    TRUEPLUS LANCETS 33G MISC     Past Medical History:  Diagnosis Date   Cancer [facility])    breast   Carcinoma of right breast ([place]) 05/20/2014   Diabetes mellitus without complication ([place])    Hypertension    Lymphedema    Personal history of radiation therapy 2015   Right breast   PONV (postoperative nausea and vomiting)    Psoriasis 1990   Stroke ([place]) 2000   residual left arm weakness   Varicose veins     Past Surgical History:  Procedure Laterality Date   AMPUTATION Left 03/04/2021   Procedure: AMPUTATION RAY;  Surgeon: Sharlotte Alamo, DPM;  Location: [facility];  Service: Podiatry;  Laterality: Left;   AMPUTATION TOE Right 01/25/2017   Procedure: AMPUTATION TOE   ;  Surgeon: Albertine Patricia, DPM;  Location: [facility];  Service: Podiatry;  Laterality: Right;   BREAST EXCISIONAL BIOPSY Right 2015   [facility] / with radiation   BREAST LUMPECTOMY Right 04-2014   followed by radiation,  no chemo   BREAST REDUCTION SURGERY  1977   CHOLECYSTECTOMY N/A 09/23/2015   Procedure: LAPAROSCOPIC CHOLECYSTECTOMY;  Surgeon: Bonner Puna, MD;  Location: [facility];  Service: General;  Laterality: N/A;   DILATION AND CURETTAGE OF UTERUS  2010   EYE SURGERY Left 2015   cataracts and left eye detached retina  repair   EYE SURGERY Right 2013   cataract with len implant   GASTRIC ROUX-EN-Y N/A 09/23/2015   Procedure: LAPAROSCOPIC ROUX-EN-Y GASTRIC;  Surgeon: Bonner Puna, MD;  Location: [facility];  Service: General;  Laterality: N/A;   HIATAL HERNIA REPAIR N/A 09/23/2015   Procedure: LAPAROSCOPIC REPAIR OF HIATAL HERNIA;  Surgeon: Bonner Puna, MD;  Location: [facility];  Service: General;  Laterality: N/A;   IRRIGATION AND DEBRIDEMENT FOOT Right 01/25/2017   Procedure: IRRIGATION AND DEBRIDEMENT FOOT;  Surgeon: Albertine Patricia, DPM;  Location: [facility];  Service: Podiatry;  Laterality: Right;   IRRIGATION AND DEBRIDEMENT FOOT Right 01/29/2017    Procedure: IRRIGATION AND DEBRIDEMENT FOOT and application of wound vac;  Surgeon: Albertine Patricia, DPM;  Location: [facility];  Service: Podiatry;  Laterality: Right;   LOWER EXTREMITY ANGIOGRAPHY Right 01/28/2017   Procedure: Lower Extremity Angiography;  Surgeon: Katha Cabal, MD;  Location: [place] [facility];  Service: Cardiovascular;  Laterality: Right;   LOWER EXTREMITY ANGIOGRAPHY Left 03/06/2021   Procedure: Lower Extremity Angiography;  Surgeon: Katha Cabal, MD;  Location: [place] [facility];  Service: Cardiovascular;  Laterality: Left;   LOWER EXTREMITY INTERVENTION  01/28/2017   Procedure: Lower Extremity Intervention;  Surgeon: Katha Cabal, MD;  Location: [place] [facility];  Service: Cardiovascular;;   REDUCTION MAMMAPLASTY      Social History Social History   Tobacco Use   Smoking status: Never   Smokeless tobacco: Never  Substance Use Topics   Alcohol use: No    Alcohol/week: 0.0 standard drinks   Drug use: No    Family History Family History  Problem Relation Age of Onset   Diabetes Mother    Cancer Mother    Breast cancer Mother 18   Diabetes Father    Diabetes Sister    Diabetes Brother     No Known Allergies   REVIEW OF SYSTEMS (Negative unless checked)  Constitutional: [] Weight loss  [] Fever  [] Chills Cardiac: [] Chest pain   [] Chest pressure   [] Palpitations   [] Shortness of breath when laying flat   [] Shortness of breath with exertion. Vascular:  [] Pain in legs with walking   [x] Pain in legs at rest  [] History of DVT   [] Phlebitis   [x] Swelling in legs   [] Varicose veins   [] Non-healing ulcers Pulmonary:   [] Uses home oxygen   [] Productive cough   [] Hemoptysis   [] Wheeze  [] COPD   [] Asthma Neurologic:  [] Dizziness   [] Seizures   [] History of stroke   [] History of TIA  [] Aphasia   [] Vissual changes   [] Weakness or numbness in arm   [] Weakness or numbness in leg Musculoskeletal:   [] Joint swelling   [] Joint pain   [] Low back  pain Hematologic:  [] Easy bruising  [] Easy bleeding   [] Hypercoagulable state   [] Anemic Gastrointestinal:  [] Diarrhea   [] Vomiting  [] Gastroesophageal reflux/heartburn   [] Difficulty swallowing. Genitourinary:  [] Chronic kidney disease   [] Difficult urination  [] Frequent urination   [] Blood in urine Skin:  [] Rashes   [] Ulcers  Psychological:  [] History of anxiety   []  History of major depression.  Physical Examination  Vitals:   09/28/21 1531  BP: 122/74  Pulse: 69  Resp: 16  Weight: 208 lb (94.3 kg)   Body mass index is 36.85 kg/m. Gen: WD/WN, NAD Head: [place]/AT, No temporalis wasting.  Ear/Nose/Throat: Hearing grossly intact, nares w/o erythema or drainage, pinna without lesions Eyes: PER, EOMI, sclera nonicteric.  Neck: Supple, no gross masses.  No JVD.  Pulmonary:  Good air movement, no audible wheezing, no use of accessory muscles.  Cardiac: RRR, precordium not hyperdynamic. Vascular:  scattered varicosities present bilaterally.  Moderate venous stasis changes to the legs bilaterally.  3-4+ soft pitting edema  Vessel Right Left  Radial Palpable Palpable  Gastrointestinal: soft, non-distended. No guarding/no peritoneal signs.  Musculoskeletal: M/S 5/5 throughout.  No deformity.  Neurologic: CN 2-12 intact. Pain and light touch intact in extremities.  Symmetrical.  Speech is fluent. Motor exam as listed above. Psychiatric: Judgment intact, Mood & affect appropriate for pt's clinical situation. Dermatologic: Venous rashes no ulcers noted.  No changes consistent with cellulitis. Lymph : Mild lichenification and skin changes of chronic lymphedema.  CBC Lab Results  Component Value Date   WBC 8.9 04/27/2021   HGB 10.1 (L) 04/27/2021   HCT 31.8 (L) 04/27/2021   MCV 85.7 04/27/2021   PLT 263 04/27/2021    BMET    Component Value Date/Time   NA 145 (H) 08/25/2021 1504   NA 139 11/18/2014 1011   K 5.4 (H) 08/25/2021 1504   K 3.7 11/18/2014 1011   CL 109 (H) 08/25/2021  1504   CL 100 11/18/2014 1011   CO2 21 08/25/2021 1504   CO2 35 (H) 11/18/2014 1011   GLUCOSE 85 08/25/2021 1504   GLUCOSE 102 (H) 04/27/2021 0555   GLUCOSE 70 11/18/2014 1011   BUN 28 (H) 08/25/2021 1504   BUN 22 (H) 11/18/2014 1011   CREATININE 1.69 (H) 08/25/2021 1504   CREATININE 0.88 11/18/2014 1011   CALCIUM 9.1 08/25/2021 1504   CALCIUM 9.0 11/18/2014 1011   GFRNONAA 52 (L) 04/27/2021 0555   GFRNONAA >60 11/18/2014 1011   GFRNONAA >60 01/04/2014 1825   GFRAA 54 (L) 06/27/2019 0506   GFRAA >60 11/18/2014 1011   GFRAA >60 01/04/2014 1825   CrCl cannot be calculated (Patient's most recent lab result is older than the maximum 21 days allowed.).  COAG Lab Results  Component Value Date   INR 0.9 04/26/2021   INR 1.2 03/03/2021   INR 1.2 06/27/2019    Radiology VAS [place] ABI WITH/WO TBI  Result Date: 10/08/2021  LOWER EXTREMITY DOPPLER STUDY Patient Name:  Elizabeth [name]  Date of Exam:   09/28/2021 Medical Rec #: [id number]         Accession #:    [id number] Date of Birth: Sep 01, 1951        Patient Gender: F Patient Age:   66 years Exam Location:  [facility] Procedure:      VAS [place] ABI WITH/WO TBI Referring Phys: --------------------------------------------------------------------------------  Indications: Peripheral artery disease.  Vascular Interventions: 03/06/2021: Abdominal Aortogram. Left Lower Extremity                         distal runoff third order catheterplacement. Performing Technologist: Concha Norway RVT  Examination Guidelines: A complete evaluation includes at minimum, Doppler waveform signals and systolic blood pressure reading at the level of bilateral brachial, anterior tibial, and posterior tibial arteries, when vessel segments are accessible. Bilateral testing is considered an integral part of a complete examination. Photoelectric Plethysmograph (PPG) waveforms and toe systolic pressure readings are included as required and additional duplex testing  as needed. Limited examinations for reoccurring indications may be performed as noted.  ABI Findings: +---------+------------------+-----+---------+--------+ Right    Rt Pressure (mmHg)IndexWaveform Comment  +---------+------------------+-----+---------+--------+ Brachial 153                                      +---------+------------------+-----+---------+--------+  ATA                             triphasicNonComp  +---------+------------------+-----+---------+--------+ PTA                             triphasicNonComp  +---------+------------------+-----+---------+--------+ Doristine Devoid Toe117               0.75 Normal            +---------+------------------+-----+---------+--------+ +---------+------------------+-----+--------+-------+ Left     Lt Pressure (mmHg)IndexWaveformComment +---------+------------------+-----+--------+-------+ Brachial 155                                    +---------+------------------+-----+--------+-------+ ATA                             biphasicNonComp +---------+------------------+-----+--------+-------+ PTA                             biphasicNonComp +---------+------------------+-----+--------+-------+ Elizabeth [name]               0.72 Normal          +---------+------------------+-----+--------+-------+ +-------+-----------+-----------+------------+------------+ ABI/TBIToday's ABIToday's TBIPrevious ABIPrevious TBI +-------+-----------+-----------+------------+------------+ Right  NonComp    .75        NonComp     .71          +-------+-----------+-----------+------------+------------+ Left   NonComp    .72        NonComp     .79          +-------+-----------+-----------+------------+------------+ Bilateral ABIs and TBIs appear essentially unchanged.  Summary: Right: Resting right ankle-brachial index indicates noncompressible right lower extremity arteries. The right toe-brachial index is normal. Left: Resting  left ankle-brachial index indicates noncompressible left lower extremity arteries. The left toe-brachial index is normal.  *See table(s) above for measurements and observations.  Electronically signed by Hortencia Pilar MD on 10/08/2021 at 7:46:24 PM.    Final    VAS [place] LOWER EXTREMITY ARTERIAL DUPLEX  Result Date: 10/08/2021 LOWER EXTREMITY ARTERIAL DUPLEX STUDY Patient Name:  Elizabeth [name]  Date of Exam:   09/28/2021 Medical Rec #: [id number]         Accession #:    [id number] Date of Birth: [birth year]        Patient Gender: F Patient Age:   20 years Exam Location:  [facility] Procedure:      VAS [place] LOWER EXTREMITY ARTERIAL DUPLEX Referring Phys: Eulogio Ditch --------------------------------------------------------------------------------   Current ABI: NonComp Performing Technologist: Concha Norway RVT  Examination Guidelines: A complete evaluation includes B-mode imaging, spectral Doppler, color Doppler, and power Doppler as needed of all accessible portions of each vessel. Bilateral testing is considered an integral part of a complete examination. Limited examinations for reoccurring indications may be performed as noted.   +----------+--------+-----+--------+---------+--------+ LEFT      PSV cm/sRatioStenosisWaveform Comments +----------+--------+-----+--------+---------+--------+ CFA Mid   85                   biphasic          +----------+--------+-----+--------+---------+--------+ DFA       67                   biphasic          +----------+--------+-----+--------+---------+--------+  SFA Prox  114                  triphasic         +----------+--------+-----+--------+---------+--------+ SFA Mid   136                  triphasic         +----------+--------+-----+--------+---------+--------+ SFA Distal99                   triphasic         +----------+--------+-----+--------+---------+--------+ POP Distal63                   triphasic          +----------+--------+-----+--------+---------+--------+ ATA Distal69                   biphasic          +----------+--------+-----+--------+---------+--------+ PTA Distal36                   biphasic          +----------+--------+-----+--------+---------+--------+  Summary: Left: Patent LE arterial system throughout. Mild to moderate atherosclerosis throughout.  See table(s) above for measurements and observations. Electronically signed by Hortencia Pilar MD on 10/08/2021 at 7:51:50 PM.    Final      Assessment/Plan 1. Lymphedema Recommend:  No surgery or intervention at this point in time.    I have reviewed my previous discussion with the patient regarding swelling and why it causes symptoms.  Patient will continue wearing graduated compression stockings class 1 (20-30 mmHg) on a daily basis. The patient will  beginning wearing the stockings first thing in the morning and removing them in the evening. The patient is instructed specifically not to sleep in the stockings.    In addition, behavioral modification including several periods of elevation of the lower extremities during the day will be continued.  This was reviewed with the patient during the initial visit.  The patient will also continue routine exercise, especially walking on a daily basis as was discussed during the initial visit.    Despite conservative treatments including graduated compression therapy class 1 and behavioral modification including exercise and elevation the patient  has not obtained adequate control of the lymphedema.  The patient still has stage 3 lymphedema and therefore, I believe that a lymph pump should be added to improve the control of the patient's lymphedema.  Additionally, a lymph pump is warranted because it will reduce the risk of cellulitis and ulceration in the future.  Patient should follow-up in six months    2. Chronic venous insufficiency Recommend:  No surgery or intervention at  this point in time.    I have reviewed my previous discussion with the patient regarding swelling and why it causes symptoms.  Patient will continue wearing graduated compression stockings class 1 (20-30 mmHg) on a daily basis. The patient will  beginning wearing the stockings first thing in the morning and removing them in the evening. The patient is instructed specifically not to sleep in the stockings.    In addition, behavioral modification including several periods of elevation of the lower extremities during the day will be continued.  This was reviewed with the patient during the initial visit.  The patient will also continue routine exercise, especially walking on a daily basis as was discussed during the initial visit.    Despite conservative treatments including graduated compression therapy class 1 and behavioral modification including exercise and  elevation the patient  has not obtained adequate control of the lymphedema.  The patient still has stage 3 lymphedema and therefore, I believe that a lymph pump should be added to improve the control of the patient's lymphedema.  Additionally, a lymph pump is warranted because it will reduce the risk of cellulitis and ulceration in the future.  Patient should follow-up in six months    3. Primary hypertension Continue antihypertensive medications as already ordered, these medications have been reviewed and there are no changes at this time.   4. 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  Hgb A1C to be monitored as already arranged by primary service   5. Mixed hyperlipidemia Continue statin as ordered and reviewed, no changes at this time     Hortencia Pilar, MD  10/10/2021 12:59 PM

## 2021-12-08 ENCOUNTER — Other Ambulatory Visit: Payer: Self-pay | Admitting: *Deleted

## 2021-12-08 ENCOUNTER — Other Ambulatory Visit
Admission: RE | Admit: 2021-12-08 | Discharge: 2021-12-08 | Disposition: A | Discharge status: Home / Self Care | Payer: Medicare PPO | Source: Ambulatory Visit | Attending: Cardiology | Admitting: Cardiology

## 2021-12-08 DIAGNOSIS — I639 Cerebral infarction, unspecified: Secondary | ICD-10-CM

## 2021-12-08 DIAGNOSIS — E876 Hypokalemia: Secondary | ICD-10-CM

## 2021-12-08 DIAGNOSIS — I10 Essential (primary) hypertension: Secondary | ICD-10-CM | POA: Diagnosis present

## 2021-12-08 LAB — BASIC METABOLIC PANEL
Anion gap: 5 (ref 5–15)
BUN: 20 mg/dL (ref 8–23)
CO2: 26 mmol/L (ref 22–32)
Calcium: 8.7 mg/dL — ABNORMAL LOW (ref 8.9–10.3)
Chloride: 105 mmol/L (ref 98–111)
Creatinine, Ser: 1.37 mg/dL — ABNORMAL HIGH (ref 0.44–1.00)
GFR, Estimated: 42 mL/min — ABNORMAL LOW (ref >60)
Glucose, Bld: 104 mg/dL — ABNORMAL HIGH (ref 70–99)
Potassium: 4 mmol/L (ref 3.5–5.1)
Sodium: 136 mmol/L (ref 135–145)

## 2022-02-02 ENCOUNTER — Other Ambulatory Visit: Payer: Self-pay | Admitting: Family Medicine

## 2022-02-02 DIAGNOSIS — Z1231 Encounter for screening mammogram for malignant neoplasm of breast: Secondary | ICD-10-CM

## 2022-03-12 ENCOUNTER — Encounter: Payer: Self-pay | Admitting: Cardiology

## 2022-03-12 ENCOUNTER — Ambulatory Visit: Payer: Medicare PPO | Admitting: Cardiology

## 2022-03-12 VITALS — BP 140/60 | HR 61 | Ht 63.0 in | Wt 206.0 lb

## 2022-03-12 DIAGNOSIS — I639 Cerebral infarction, unspecified: Secondary | ICD-10-CM | POA: Diagnosis not present

## 2022-03-12 DIAGNOSIS — R600 Localized edema: Secondary | ICD-10-CM

## 2022-03-12 DIAGNOSIS — I10 Essential (primary) hypertension: Secondary | ICD-10-CM

## 2022-03-12 NOTE — Patient Instructions (Signed)
Medication Instructions:  ?Your physician recommends that you continue on your current medications as directed. Please refer to the Current Medication list given to you today. ? ?*If you need a refill on your cardiac medications before your next appointment, please call your pharmacy* ? ? ?Lab Work: ?None ordered ?If you have labs (blood work) drawn today and your tests are completely normal, you will receive your results only by: ?MyChart Message (if you have MyChart) OR ?A paper copy in the mail ?If you have any lab test that is abnormal or we need to change your treatment, we will call you to review the results. ? ? ?Testing/Procedures: ?None ordered ? ? ?Follow-Up: ?At [facility], you and your health needs are our priority.  As part of our continuing mission to provide you with exceptional heart care, we have created designated Provider Care Teams.  These Care Teams include your primary Cardiologist (physician) and Advanced Practice Providers (APPs -  Physician Assistants and Nurse Practitioners) who all work together to provide you with the care you need, when you need it. ? ?We recommend signing up for the patient portal called "MyChart".  Sign up information is provided on this After Visit Summary.  MyChart is used to connect with patients for Virtual Visits (Telemedicine).  Patients are able to view lab/test results, encounter notes, upcoming appointments, etc.  Non-urgent messages can be sent to your provider as well.   ?To learn more about what you can do with MyChart, go to [email or web address].   ? ?Your next appointment:   ?1 year(s) ? ?The format for your next appointment:   ?In Person ? ?Provider:   ?You may see Brian Agbor-Etang, MD or one of the following Advanced Practice Providers on your designated Care Team:   ?Christopher Berge, NP ?Ryan Dunn, PA-C ?Cadence Furth, PA-C  ? ? ?Other Instructions ? ? ?Important Information About Sugar ? ? ? ? ?  ?

## 2022-03-12 NOTE — Progress Notes (Signed)
?Cardiology Office Note:   ? ?Date:  03/12/2022  ? ?ID:  Elizabeth [name], DOB May 22, 1951, MRN [id number] ? ?PCP:  Marguerita Merles, MD ?  ?[facility] Providers ?Cardiologist:  Kate Sable, MD    ? ?Referring MD: Marguerita Merles, MD  ? ?Chief Complaint  ?Patient presents with  ? OTher  ?  6 month follow up -- Meds reviewed verbally with patient.   ? ? ? ?History of Present Illness:   ? ?Elizabeth [name] is a 71 y.o. 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   ? ?She was last seen due to lower extremity edema and CVA.  Cardiac monitor did not show any significant arrhythmias such as A-fib or flutter.  Creatinine worsening, Lasix was reduced to 20 mg daily, last BMP with stable kidney function.  She states doing okay, has no concerns at this time.  Taking all medications as prescribed. ? ?Prior notes ?Cardiac monitor 08/2021 no evidence of A-fib or flutter. ?Echo 04/2021 showed normal systolic and diastolic function, EF 60 to 65%.  ? ?Past Medical History:  ?Diagnosis Date  ? Cancer [facility])   ? breast  ? Carcinoma of right breast ([place]) 05/20/2014  ? Diabetes mellitus without complication ([place])   ? Hypertension   ? Lymphedema   ? Personal history of radiation therapy 2015  ? Right breast  ? PONV (postoperative nausea and vomiting)   ? Psoriasis 1990  ? Stroke [facility]) 2000  ? residual left arm weakness  ? Varicose veins   ? ? ?Past Surgical History:  ?Procedure Laterality Date  ? AMPUTATION Left 03/04/2021  ? Procedure: AMPUTATION RAY;  Surgeon: Sharlotte Alamo, DPM;  Location: [facility] ORS;  Service: Podiatry;  Laterality: Left;  ? AMPUTATION TOE Right 01/25/2017  ? Procedure: AMPUTATION TOE   ;  Surgeon: Albertine Patricia, DPM;  Location: [facility] ORS;  Service: Podiatry;  Laterality: Right;  ? BREAST EXCISIONAL BIOPSY Right 2015  ? [facility] / with radiation  ? BREAST LUMPECTOMY Right 04-2014  ? followed by radiation,  no chemo  ?  BREAST REDUCTION SURGERY  1977  ? CHOLECYSTECTOMY N/A 09/23/2015  ? Procedure: LAPAROSCOPIC CHOLECYSTECTOMY;  Surgeon: Bonner Puna, MD;  Location: [facility] ORS;  Service: General;  Laterality: N/A;  ? [place] OF UTERUS  2010  ? EYE SURGERY Left 2015  ? cataracts and left eye detached retina repair  ? EYE SURGERY Right 2013  ? cataract with len implant  ? GASTRIC ROUX-EN-Y N/A 09/23/2015  ? Procedure: LAPAROSCOPIC ROUX-EN-Y GASTRIC;  Surgeon: Bonner Puna, MD;  Location: [facility] ORS;  Service: General;  Laterality: N/A;  ? HIATAL HERNIA REPAIR N/A 09/23/2015  ? Procedure: LAPAROSCOPIC REPAIR OF HIATAL HERNIA;  Surgeon: Bonner Puna, MD;  Location: [facility];  Service: General;  Laterality: N/A;  ? IRRIGATION AND DEBRIDEMENT FOOT Right 01/25/2017  ? Procedure: IRRIGATION AND DEBRIDEMENT FOOT;  Surgeon: Albertine Patricia, DPM;  Location: [facility] OR[facility];  Service: Podiatry;  Laterality: Right;  ? IRRIGATION AND DEBRIDEMENT FOOT Right 01/29/2017  ? Procedure: IRRIGATION AND DEBRIDEMENT FOOT and application of wound vac;  Surgeon: Albertine Patricia, DPM;  Location: [facility];  Service: Podiatry;  Laterality: Right;  ? LOWER EXTREMITY ANGIOGRAPHY Right 01/28/2017  ? Procedure: Lower Extremity Angiography;  Surgeon: Katha Cabal, MD;  Location: [place] [facility];  Service: Cardiovascular;  Laterality: Right;  ? LOWER EXTREMITY ANGIOGRAPHY Left 03/06/2021  ?  Procedure: Lower Extremity Angiography;  Surgeon: Katha Cabal, MD;  Location: [place] [facility];  Service: Cardiovascular;  Laterality: Left;  ? LOWER EXTREMITY INTERVENTION  01/28/2017  ? Procedure: Lower Extremity Intervention;  Surgeon: Katha Cabal, MD;  Location: [place] [facility];  Service: Cardiovascular;;  ? REDUCTION MAMMAPLASTY    ? ? ?Current Medications: ?Current Meds  ?Medication Sig  ? acetaminophen (TYLENOL) 325 MG tablet Take 2 tablets (650 mg total) by mouth every 6 (six) hours as needed for mild pain (or Fever >/= 101).  ? albuterol  (PROVENTIL HFA;VENTOLIN HFA) 108 (90 BASE) MCG/ACT inhaler Inhale 2 puffs into the lungs every 6 (six) hours as needed for wheezing or shortness of breath.  ? Calcium Carb-Cholecalciferol (CALCIUM 1000 + D PO) Take 1 tablet by mouth daily.  ? clopidogrel (PLAVIX) 75 MG tablet Take 75 mg by mouth daily.  ? furosemide (LASIX) 40 MG tablet Take 0.5 tablets (20 mg total) by mouth daily.  ? gabapentin (NEURONTIN) 300 MG capsule Take 300 mg by mouth 3 (three) times daily.   ? linagliptin (TRADJENTA) 5 MG TABS tablet Take 5 mg by mouth daily.  ? lisinopril (ZESTRIL) 10 MG tablet Take 1 tablet (10 mg total) by mouth daily.  ? metFORMIN (GLUCOPHAGE) 1000 MG tablet Take 1,000 mg by mouth daily with breakfast.   ? PARoxetine (PAXIL) 40 MG tablet Take 40 mg by mouth daily.  ? pravastatin (PRAVACHOL) 40 MG tablet Take 40 mg by mouth daily. In afternoon  ? triamcinolone ointment (KENALOG) 0.5 % Apply 1 application topically 2 (two) times daily.  ? TRUE METRIX BLOOD GLUCOSE TEST test strip   ? TRUEPLUS LANCETS 33G MISC   ?  ? ?Allergies:   Patient has no known allergies.  ? ?Social History  ? ?Socioeconomic History  ? Marital status: Married  ?  Spouse name: Not on file  ? Number of children: Not on file  ? Years of education: Not on file  ? Highest education level: Not on file  ?Occupational History  ? Not on file  ?Tobacco Use  ? Smoking status: Never  ? Smokeless tobacco: Never  ?Substance and Sexual Activity  ? Alcohol use: No  ?  Alcohol/week: 0.0 standard drinks  ? Drug use: No  ? Sexual activity: Yes  ?  Birth control/protection: Post-menopausal  ?Other Topics Concern  ? Not on file  ?Social History Narrative  ? Not on file  ? ?Social Determinants of Health  ? ?Financial Resource Strain: Not on file  ?Food Insecurity: Not on file  ?Transportation Needs: Not on file  ?Physical Activity: Not on file  ?Stress: Not on file  ?Social Connections: Not on file  ?  ? ?Family History: ?The patient's family history includes Breast  cancer (age of onset: 45) in her mother; Cancer in her mother; Diabetes in her brother, father, mother, and sister. ? ?ROS:   ?Please see the history of present illness.    ? All other systems reviewed and are negative. ? ?EKGs/Labs/Other Studies Reviewed:   ? ?The following studies were reviewed today: ? ? ?EKG:  EKG is ordered today.  EKG shows normal sinus rhythm ? ?Recent Labs: ?04/26/2021: ALT 10 ?04/27/2021: Hemoglobin 10.1; Platelets 263 ?12/08/2021: BUN 20; Creatinine, Ser 1.37; Potassium 4.0; Sodium 136  ?Recent Lipid Panel ?   ?Component Value Date/Time  ? CHOL 132 04/27/2021 0555  ? TRIG 52 04/27/2021 0555  ? HDL 65 04/27/2021 0555  ? CHOLHDL 2.0 04/27/2021 0555  ?  VLDL 10 04/27/2021 0555  ? [place] 57 04/27/2021 0555  ? ? ? ?Risk Assessment/Calculations:   ? ? ?    ? ?Physical Exam:   ? ?VS:  BP 140/60 (BP Location: Left Arm, Patient Position: Sitting, Cuff Size: Large)   Pulse 61   Ht '5\' 3"'$  (1.6 m)   Wt 206 lb (93.4 kg)   SpO2 97%   BMI 36.49 kg/m?    ? ?Wt Readings from Last 3 Encounters:  ?03/12/22 206 lb (93.4 kg)  ?09/28/21 208 lb (94.3 kg)  ?09/07/21 202 lb (91.6 kg)  ?  ? ?GEN:  Well nourished, well developed in no acute distress ?HEENT: Normal ?NECK: No JVD; No carotid bruits ?LYMPHATICS: No lymphadenopathy ?CARDIAC: RRR, no murmurs, rubs, gallops ?RESPIRATORY:  Clear to auscultation without rales, wheezing or rhonchi  ?ABDOMEN: Soft, non-tender, non-distended ?MUSCULOSKELETAL: 1+ edema, left leg weakness ?SKIN: Warm and dry ?NEUROLOGIC:  Alert and oriented x 3 ?PSYCHIATRIC:  Normal affect  ? ?ASSESSMENT:   ? ?1. Cerebrovascular accident (CVA), unspecified mechanism ([place])   ?2. Primary hypertension   ?3. Bilateral leg edema   ? ? ? ?PLAN:   ? ?In order of problems listed above: ? ?History of CVA, recent TIA.   Echo with preserved EF, no evidence for atrial shunting. Continue Plavix, Pravachol ?Hypertension, continue lisinopril 10 mg daily. ?Leg edema, leg raising advised, continue Lasix 20 mg  daily, last creatinine stable. ? ?Follow-up in 1 year ?  ? ?Medication Adjustments/Labs and Tests Ordered: ?Current medicines are reviewed at length with the patient today.  Concerns regarding medicines are outline

## 2022-03-29 ENCOUNTER — Ambulatory Visit (INDEPENDENT_AMBULATORY_CARE_PROVIDER_SITE_OTHER): Payer: Medicare HMO | Admitting: Vascular Surgery

## 2022-04-26 ENCOUNTER — Ambulatory Visit (INDEPENDENT_AMBULATORY_CARE_PROVIDER_SITE_OTHER): Payer: Medicare HMO | Admitting: Vascular Surgery

## 2022-05-17 ENCOUNTER — Ambulatory Visit (INDEPENDENT_AMBULATORY_CARE_PROVIDER_SITE_OTHER): Payer: Medicare PPO | Admitting: Vascular Surgery

## 2022-05-17 ENCOUNTER — Encounter (INDEPENDENT_AMBULATORY_CARE_PROVIDER_SITE_OTHER): Payer: Self-pay | Admitting: Vascular Surgery

## 2022-05-17 VITALS — BP 112/72 | HR 63 | Resp 16 | Ht 63.0 in | Wt 197.0 lb

## 2022-05-17 DIAGNOSIS — I890 Lymphedema, not elsewhere classified: Secondary | ICD-10-CM

## 2022-05-17 DIAGNOSIS — I872 Venous insufficiency (chronic) (peripheral): Secondary | ICD-10-CM | POA: Diagnosis not present

## 2022-05-17 DIAGNOSIS — E1151 Type 2 diabetes mellitus with diabetic peripheral angiopathy without gangrene: Secondary | ICD-10-CM

## 2022-05-17 DIAGNOSIS — I10 Essential (primary) hypertension: Secondary | ICD-10-CM | POA: Diagnosis not present

## 2022-05-17 DIAGNOSIS — E782 Mixed hyperlipidemia: Secondary | ICD-10-CM

## 2022-05-17 NOTE — Progress Notes (Signed)
MRN : [id number]  Elizabeth [name] is a 71 y.o. (13-Jan-1951) female who presents with chief complaint of legs swell.  History of Present Illness:   The patient returns to the office for followup evaluation regarding leg swelling.  The swelling has persisted but with the lymph pump is under much, much better controlled. The pain associated with swelling is decreased. There have not been any interval development of a ulcerations or wounds.  The patient denies problems with the pump, noting it is working well and the leggings are in good condition.  Since the previous visit the patient has been wearing graduated compression stockings and using the lymph pump on a routine basis and  has noted significant improvement in the lymphedema.   Patient stated the lymph pump has been helpful with the treatment of the lymphedema.    Current Meds  Medication Sig   acetaminophen (TYLENOL) 325 MG tablet Take 2 tablets (650 mg total) by mouth every 6 (six) hours as needed for mild pain (or Fever >/= 101).   clopidogrel (PLAVIX) 75 MG tablet Take 75 mg by mouth daily.   furosemide (LASIX) 40 MG tablet Take 0.5 tablets (20 mg total) by mouth daily.   linaclotide (LINZESS) 145 MCG CAPS capsule    linagliptin (TRADJENTA) 5 MG TABS tablet Take 5 mg by mouth daily.   lisinopril (ZESTRIL) 10 MG tablet Take 1 tablet (10 mg total) by mouth daily.   metFORMIN (GLUCOPHAGE) 1000 MG tablet Take 1,000 mg by mouth daily with breakfast.    potassium chloride (MICRO-K) 10 MEQ CR capsule    pravastatin (PRAVACHOL) 40 MG tablet Take 40 mg by mouth daily. In afternoon   TRUE METRIX BLOOD GLUCOSE TEST test strip    TRUEPLUS LANCETS 33G MISC     Past Medical History:  Diagnosis Date   Cancer [facility])    breast   Carcinoma of right breast ([place]) 05/20/2014   Diabetes mellitus without complication ([place])    Hypertension    Lymphedema    Personal history of radiation therapy 2015   Right breast   PONV (postoperative  nausea and vomiting)    Psoriasis 1990   Stroke ([place]) 2000   residual left arm weakness   Varicose veins     Past Surgical History:  Procedure Laterality Date   AMPUTATION Left 03/04/2021   Procedure: AMPUTATION RAY;  Surgeon: Sharlotte Alamo, DPM;  Location: [facility];  Service: Podiatry;  Laterality: Left;   AMPUTATION TOE Right 01/25/2017   Procedure: AMPUTATION TOE   ;  Surgeon: Albertine Patricia, DPM;  Location: [facility];  Service: Podiatry;  Laterality: Right;   BREAST EXCISIONAL BIOPSY Right 2015   [facility] / with radiation   BREAST LUMPECTOMY Right 04-2014   followed by radiation,  no chemo   BREAST REDUCTION SURGERY  1977   CHOLECYSTECTOMY N/A 09/23/2015   Procedure: LAPAROSCOPIC CHOLECYSTECTOMY;  Surgeon: Bonner Puna, MD;  Location: [facility];  Service: General;  Laterality: N/A;   DILATION AND CURETTAGE OF UTERUS  2010   EYE SURGERY Left 2015   cataracts and left eye detached retina repair   EYE SURGERY Right 2013   cataract with len implant   GASTRIC ROUX-EN-Y N/A 09/23/2015   Procedure: LAPAROSCOPIC ROUX-EN-Y GASTRIC;  Surgeon: Bonner Puna, MD;  Location: [facility];  Service: General;  Laterality: N/A;   HIATAL HERNIA REPAIR N/A 09/23/2015   Procedure: LAPAROSCOPIC REPAIR OF HIATAL HERNIA;  Surgeon: Bonner Puna, MD;  Location: [facility];  Service: General;  Laterality: N/A;   IRRIGATION AND DEBRIDEMENT FOOT Right 01/25/2017   Procedure: IRRIGATION AND DEBRIDEMENT FOOT;  Surgeon: Albertine Patricia, DPM;  Location: [facility];  Service: Podiatry;  Laterality: Right;   IRRIGATION AND DEBRIDEMENT FOOT Right 01/29/2017   Procedure: IRRIGATION AND DEBRIDEMENT FOOT and application of wound vac;  Surgeon: Albertine Patricia, DPM;  Location: [facility];  Service: Podiatry;  Laterality: Right;   LOWER EXTREMITY ANGIOGRAPHY Right 01/28/2017   Procedure: Lower Extremity Angiography;  Surgeon: Katha Cabal, MD;  Location: [place] [facility];  Service: Cardiovascular;  Laterality: Right;   LOWER EXTREMITY  ANGIOGRAPHY Left 03/06/2021   Procedure: Lower Extremity Angiography;  Surgeon: Katha Cabal, MD;  Location: [place] [facility];  Service: Cardiovascular;  Laterality: Left;   LOWER EXTREMITY INTERVENTION  01/28/2017   Procedure: Lower Extremity Intervention;  Surgeon: Katha Cabal, MD;  Location: [place] [facility];  Service: Cardiovascular;;   REDUCTION MAMMAPLASTY      Social History Social History   Tobacco Use   Smoking status: Never   Smokeless tobacco: Never  Substance Use Topics   Alcohol use: No    Alcohol/week: 0.0 standard drinks of alcohol   Drug use: No    Family History Family History  Problem Relation Age of Onset   Diabetes Mother    Cancer Mother    Breast cancer Mother 31   Diabetes Father    Diabetes Sister    Diabetes Brother     No Known Allergies   REVIEW OF SYSTEMS (Negative unless checked)  Constitutional: '[]'$ Weight loss  '[]'$ Fever  '[]'$ Chills Cardiac: '[]'$ Chest pain   '[]'$ Chest pressure   '[]'$ Palpitations   '[]'$ Shortness of breath when laying flat   '[]'$ Shortness of breath with exertion. Vascular:  '[]'$ Pain in legs with walking   '[x]'$ Pain in legs with standing  '[]'$ History of DVT   '[]'$ Phlebitis   '[x]'$ Swelling in legs   '[]'$ Varicose veins   '[]'$ Non-healing ulcers Pulmonary:   '[]'$ Uses home oxygen   '[]'$ Productive cough   '[]'$ Hemoptysis   '[]'$ Wheeze  '[]'$ COPD   '[]'$ Asthma Neurologic:  '[]'$ Dizziness   '[]'$ Seizures   '[]'$ History of stroke   '[]'$ History of TIA  '[]'$ Aphasia   '[]'$ Vissual changes   '[]'$ Weakness or numbness in arm   '[]'$ Weakness or numbness in leg Musculoskeletal:   '[]'$ Joint swelling   '[]'$ Joint pain   '[]'$ Low back pain Hematologic:  '[]'$ Easy bruising  '[]'$ Easy bleeding   '[]'$ Hypercoagulable state   '[]'$ Anemic Gastrointestinal:  '[]'$ Diarrhea   '[]'$ Vomiting  '[x]'$ Gastroesophageal reflux/heartburn   '[]'$ Difficulty swallowing. Genitourinary:  '[]'$ Chronic kidney disease   '[]'$ Difficult urination  '[]'$ Frequent urination   '[]'$ Blood in urine Skin:  '[]'$ Rashes   '[]'$ Ulcers  Psychological:  '[]'$ History of anxiety   '[]'$   History of major depression.  Physical Examination  Vitals:   05/17/22 1603  BP: 112/72  Pulse: 63  Resp: 16  Weight: 197 lb (89.4 kg)  Height: '5\' 3"'$  (1.6 m)   Body mass index is 34.9 kg/m. Gen: WD/WN, NAD Head: [place]/AT, No temporalis wasting.  Ear/Nose/Throat: Hearing grossly intact, nares w/o erythema or drainage, pinna without lesions Eyes: PER, EOMI, sclera nonicteric.  Neck: Supple, no gross masses.  No JVD.  Pulmonary:  Good air movement, no audible wheezing, no use of accessory muscles.  Cardiac: RRR, precordium not hyperdynamic. Vascular:  scattered varicosities present bilaterally.  Mild venous stasis changes to the legs bilaterally.  3+ soft pitting edema  Vessel Right Left  Radial Palpable Palpable  Gastrointestinal: soft, non-distended.  No guarding/no peritoneal signs.  Musculoskeletal: M/S 5/5 throughout.  No deformity.  Neurologic: CN 2-12 intact. Pain and light touch intact in extremities.  Symmetrical.  Speech is fluent. Motor exam as listed above. Psychiatric: Judgment intact, Mood & affect appropriate for pt's clinical situation. Dermatologic: Venous rashes no ulcers noted.  No changes consistent with cellulitis. Lymph : No lichenification or skin changes of chronic lymphedema.  CBC Lab Results  Component Value Date   WBC 8.9 04/27/2021   HGB 10.1 (L) 04/27/2021   HCT 31.8 (L) 04/27/2021   MCV 85.7 04/27/2021   PLT 263 04/27/2021    BMET    Component Value Date/Time   NA 136 12/08/2021 1534   NA 145 (H) 08/25/2021 1504   NA 139 11/18/2014 1011   K 4.0 12/08/2021 1534   K 3.7 11/18/2014 1011   CL 105 12/08/2021 1534   CL 100 11/18/2014 1011   CO2 26 12/08/2021 1534   CO2 35 (H) 11/18/2014 1011   GLUCOSE 104 (H) 12/08/2021 1534   GLUCOSE 70 11/18/2014 1011   BUN 20 12/08/2021 1534   BUN 28 (H) 08/25/2021 1504   BUN 22 (H) 11/18/2014 1011   CREATININE 1.37 (H) 12/08/2021 1534   CREATININE 0.88 11/18/2014 1011   CALCIUM 8.7 (L) 12/08/2021 1534    CALCIUM 9.0 11/18/2014 1011   GFRNONAA 42 (L) 12/08/2021 1534   GFRNONAA >60 11/18/2014 1011   GFRNONAA >60 01/04/2014 1825   GFRAA 54 (L) 06/27/2019 0506   GFRAA >60 11/18/2014 1011   GFRAA >60 01/04/2014 1825   CrCl cannot be calculated (Patient's most recent lab result is older than the maximum 21 days allowed.).  COAG Lab Results  Component Value Date   INR 0.9 04/26/2021   INR 1.2 03/03/2021   INR 1.2 06/27/2019    Radiology No results found.   Assessment/Plan 1. Lymphedema Recommend:  No surgery or intervention at this point in time.    I have reviewed my discussion with the patient regarding lymphedema and why it  causes symptoms.  Patient will continue wearing graduated compression on a daily basis. The patient should put the compression on first thing in the morning and removing them in the evening. The patient should not sleep in the compression.   In addition, behavioral modification throughout the day will be continued.  This will include frequent elevation (such as in a recliner), use of over the counter pain medications as needed and exercise such as walking.  The systemic causes for chronic edema such as liver, kidney and cardiac etiologies does not appear to have significant changed over the past year.    The patient will continue aggressive use of the  lymph pump.  This will continue to improve the edema control and prevent sequela such as ulcers and infections.   The patient will follow-up with me on an annual basis.    2. Chronic venous insufficiency Recommend:  No surgery or intervention at this point in time.    I have reviewed my discussion with the patient regarding lymphedema and why it  causes symptoms.  Patient will continue wearing graduated compression on a daily basis. The patient should put the compression on first thing in the morning and removing them in the evening. The patient should not sleep in the compression.   In addition,  behavioral modification throughout the day will be continued.  This will include frequent elevation (such as in a recliner), use of over the counter pain medications as needed  and exercise such as walking.  The systemic causes for chronic edema such as liver, kidney and cardiac etiologies does not appear to have significant changed over the past year.    The patient will continue aggressive use of the  lymph pump.  This will continue to improve the edema control and prevent sequela such as ulcers and infections.   The patient will follow-up with me on an annual basis.    3. Primary hypertension Continue antihypertensive medications as already ordered, these medications have been reviewed and there are no changes at this time.   4. 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  Hgb A1C to be monitored as already arranged by primary service   5. Mixed hyperlipidemia Continue statin as ordered and reviewed, no changes at this time     Hortencia Pilar, MD  05/17/2022 4:09 PM

## 2022-06-05 ENCOUNTER — Other Ambulatory Visit: Payer: Self-pay

## 2022-06-05 ENCOUNTER — Inpatient Hospital Stay
Admission: EM | Admit: 2022-06-05 | Discharge: 2022-06-09 | DRG: 872 | Disposition: A | Discharge status: Home / Self Care | Payer: Medicare PPO | Attending: Internal Medicine | Admitting: Internal Medicine

## 2022-06-05 ENCOUNTER — Emergency Department: Payer: Medicare PPO

## 2022-06-05 ENCOUNTER — Encounter: Payer: Self-pay | Admitting: Emergency Medicine

## 2022-06-05 DIAGNOSIS — I10 Essential (primary) hypertension: Secondary | ICD-10-CM | POA: Diagnosis present

## 2022-06-05 DIAGNOSIS — Z8673 Personal history of transient ischemic attack (TIA), and cerebral infarction without residual deficits: Secondary | ICD-10-CM

## 2022-06-05 DIAGNOSIS — E1122 Type 2 diabetes mellitus with diabetic chronic kidney disease: Secondary | ICD-10-CM | POA: Diagnosis present

## 2022-06-05 DIAGNOSIS — I69334 Monoplegia of upper limb following cerebral infarction affecting left non-dominant side: Secondary | ICD-10-CM

## 2022-06-05 DIAGNOSIS — Z923 Personal history of irradiation: Secondary | ICD-10-CM

## 2022-06-05 DIAGNOSIS — Z89421 Acquired absence of other right toe(s): Secondary | ICD-10-CM

## 2022-06-05 DIAGNOSIS — Z853 Personal history of malignant neoplasm of breast: Secondary | ICD-10-CM

## 2022-06-05 DIAGNOSIS — Z9049 Acquired absence of other specified parts of digestive tract: Secondary | ICD-10-CM

## 2022-06-05 DIAGNOSIS — Z89432 Acquired absence of left foot: Secondary | ICD-10-CM

## 2022-06-05 DIAGNOSIS — N136 Pyonephrosis: Secondary | ICD-10-CM | POA: Diagnosis present

## 2022-06-05 DIAGNOSIS — E1142 Type 2 diabetes mellitus with diabetic polyneuropathy: Secondary | ICD-10-CM | POA: Diagnosis present

## 2022-06-05 DIAGNOSIS — A4151 Sepsis due to Escherichia coli [E. coli]: Secondary | ICD-10-CM | POA: Diagnosis not present

## 2022-06-05 DIAGNOSIS — R112 Nausea with vomiting, unspecified: Secondary | ICD-10-CM | POA: Diagnosis present

## 2022-06-05 DIAGNOSIS — R509 Fever, unspecified: Secondary | ICD-10-CM

## 2022-06-05 DIAGNOSIS — N1330 Unspecified hydronephrosis: Secondary | ICD-10-CM | POA: Diagnosis present

## 2022-06-05 DIAGNOSIS — I129 Hypertensive chronic kidney disease with stage 1 through stage 4 chronic kidney disease, or unspecified chronic kidney disease: Secondary | ICD-10-CM | POA: Diagnosis present

## 2022-06-05 DIAGNOSIS — A419 Sepsis, unspecified organism: Secondary | ICD-10-CM

## 2022-06-05 DIAGNOSIS — Z20822 Contact with and (suspected) exposure to covid-19: Secondary | ICD-10-CM | POA: Diagnosis present

## 2022-06-05 DIAGNOSIS — L409 Psoriasis, unspecified: Secondary | ICD-10-CM | POA: Diagnosis present

## 2022-06-05 DIAGNOSIS — N390 Urinary tract infection, site not specified: Secondary | ICD-10-CM | POA: Diagnosis present

## 2022-06-05 DIAGNOSIS — Z803 Family history of malignant neoplasm of breast: Secondary | ICD-10-CM

## 2022-06-05 DIAGNOSIS — N179 Acute kidney failure, unspecified: Secondary | ICD-10-CM | POA: Diagnosis present

## 2022-06-05 DIAGNOSIS — Z9884 Bariatric surgery status: Secondary | ICD-10-CM

## 2022-06-05 DIAGNOSIS — E669 Obesity, unspecified: Secondary | ICD-10-CM | POA: Diagnosis present

## 2022-06-05 DIAGNOSIS — N1832 Chronic kidney disease, stage 3b: Secondary | ICD-10-CM | POA: Diagnosis present

## 2022-06-05 DIAGNOSIS — I2510 Atherosclerotic heart disease of native coronary artery without angina pectoris: Secondary | ICD-10-CM | POA: Diagnosis present

## 2022-06-05 DIAGNOSIS — D631 Anemia in chronic kidney disease: Secondary | ICD-10-CM | POA: Diagnosis present

## 2022-06-05 DIAGNOSIS — A4150 Gram-negative sepsis, unspecified: Secondary | ICD-10-CM | POA: Diagnosis present

## 2022-06-05 DIAGNOSIS — Z7902 Long term (current) use of antithrombotics/antiplatelets: Secondary | ICD-10-CM

## 2022-06-05 DIAGNOSIS — Z79899 Other long term (current) drug therapy: Secondary | ICD-10-CM

## 2022-06-05 DIAGNOSIS — Z7984 Long term (current) use of oral hypoglycemic drugs: Secondary | ICD-10-CM

## 2022-06-05 DIAGNOSIS — E785 Hyperlipidemia, unspecified: Secondary | ICD-10-CM | POA: Diagnosis present

## 2022-06-05 DIAGNOSIS — Z6836 Body mass index (BMI) 36.0-36.9, adult: Secondary | ICD-10-CM

## 2022-06-05 DIAGNOSIS — I8390 Asymptomatic varicose veins of unspecified lower extremity: Secondary | ICD-10-CM | POA: Diagnosis present

## 2022-06-05 DIAGNOSIS — Z833 Family history of diabetes mellitus: Secondary | ICD-10-CM

## 2022-06-05 LAB — COMPREHENSIVE METABOLIC PANEL
ALT: 9 U/L (ref 0–44)
AST: 13 U/L — ABNORMAL LOW (ref 15–41)
Albumin: 2.8 g/dL — ABNORMAL LOW (ref 3.5–5.0)
Alkaline Phosphatase: 77 U/L (ref 38–126)
Anion gap: 8 (ref 5–15)
BUN: 24 mg/dL — ABNORMAL HIGH (ref 8–23)
CO2: 23 mmol/L (ref 22–32)
Calcium: 8.2 mg/dL — ABNORMAL LOW (ref 8.9–10.3)
Chloride: 105 mmol/L (ref 98–111)
Creatinine, Ser: 1.56 mg/dL — ABNORMAL HIGH (ref 0.44–1.00)
GFR, Estimated: 36 mL/min — ABNORMAL LOW (ref >60)
Glucose, Bld: 190 mg/dL — ABNORMAL HIGH (ref 70–99)
Potassium: 4.2 mmol/L (ref 3.5–5.1)
Sodium: 136 mmol/L (ref 135–145)
Total Bilirubin: 0.7 mg/dL (ref 0.3–1.2)
Total Protein: 6.9 g/dL (ref 6.5–8.1)

## 2022-06-05 LAB — CBC WITH DIFFERENTIAL/PLATELET
Abs Immature Granulocytes: 0.05 10*3/uL (ref 0.00–0.07)
Basophils Absolute: 0 10*3/uL (ref 0.0–0.1)
Basophils Relative: 0 %
Eosinophils Absolute: 0 10*3/uL (ref 0.0–0.5)
Eosinophils Relative: 0 %
HCT: 31.5 % — ABNORMAL LOW (ref 36.0–46.0)
Hemoglobin: 9.8 g/dL — ABNORMAL LOW (ref 12.0–15.0)
Immature Granulocytes: 0 %
Lymphocytes Relative: 4 %
Lymphs Abs: 0.5 10*3/uL — ABNORMAL LOW (ref 0.7–4.0)
MCH: 25.3 pg — ABNORMAL LOW (ref 26.0–34.0)
MCHC: 31.1 g/dL (ref 30.0–36.0)
MCV: 81.2 fL (ref 80.0–100.0)
Monocytes Absolute: 1.1 10*3/uL — ABNORMAL HIGH (ref 0.1–1.0)
Monocytes Relative: 8 %
Neutro Abs: 11.2 10*3/uL — ABNORMAL HIGH (ref 1.7–7.7)
Neutrophils Relative %: 88 %
Platelets: 305 10*3/uL (ref 150–400)
RBC: 3.88 MIL/uL (ref 3.87–5.11)
RDW: 16.5 % — ABNORMAL HIGH (ref 11.5–15.5)
WBC: 12.9 10*3/uL — ABNORMAL HIGH (ref 4.0–10.5)
nRBC: 0 % (ref 0.0–0.2)

## 2022-06-05 LAB — URINALYSIS, ROUTINE W REFLEX MICROSCOPIC
Bilirubin Urine: NEGATIVE
Glucose, UA: NEGATIVE mg/dL
Ketones, ur: NEGATIVE mg/dL
Nitrite: POSITIVE — AB
Protein, ur: 30 mg/dL — AB
Specific Gravity, Urine: 1.011 (ref 1.005–1.030)
WBC, UA: >50 WBC/hpf — ABNORMAL HIGH (ref 0–5)
pH: 5 (ref 5.0–8.0)

## 2022-06-05 LAB — PROTIME-INR
INR: 1.1 (ref 0.8–1.2)
Prothrombin Time: 14.1 seconds (ref 11.4–15.2)

## 2022-06-05 LAB — LACTIC ACID, PLASMA: Lactic Acid, Venous: 1.3 mmol/L (ref 0.5–1.9)

## 2022-06-05 LAB — PROCALCITONIN: Procalcitonin: 1.23 ng/mL

## 2022-06-05 LAB — TROPONIN I (HIGH SENSITIVITY): Troponin I (High Sensitivity): 10 ng/L (ref <18)

## 2022-06-05 LAB — LIPASE, BLOOD: Lipase: 27 U/L (ref 11–51)

## 2022-06-05 MED ORDER — ONDANSETRON HCL 4 MG/2ML IJ SOLN
4.0000 mg | Freq: Once | INTRAMUSCULAR | Status: AC
  Administered 2022-06-05: 4 mg via INTRAVENOUS
  Filled 2022-06-05: qty 2

## 2022-06-05 MED ORDER — ACETAMINOPHEN 325 MG PO TABS
650.0000 mg | ORAL_TABLET | Freq: Once | ORAL | Status: AC
  Administered 2022-06-05: 650 mg via ORAL
  Filled 2022-06-05: qty 2

## 2022-06-05 MED ORDER — SODIUM CHLORIDE 0.9 % IV SOLN
1.0000 g | Freq: Once | INTRAVENOUS | Status: AC
  Administered 2022-06-05: 1 g via INTRAVENOUS
  Filled 2022-06-05: qty 10

## 2022-06-05 MED ORDER — SODIUM CHLORIDE 0.9 % IV BOLUS
1000.0000 mL | Freq: Once | INTRAVENOUS | Status: AC
  Administered 2022-06-05: 1000 mL via INTRAVENOUS

## 2022-06-05 MED ORDER — ACETAMINOPHEN 325 MG PO TABS
325.0000 mg | ORAL_TABLET | Freq: Once | ORAL | Status: AC
  Administered 2022-06-05: 325 mg via ORAL
  Filled 2022-06-05: qty 1

## 2022-06-05 MED ORDER — ONDANSETRON 4 MG PO TBDP
4.0000 mg | ORAL_TABLET | Freq: Once | ORAL | Status: AC
Start: 2022-06-05 — End: 2022-06-05
  Administered 2022-06-05: 4 mg via ORAL
  Filled 2022-06-05: qty 1

## 2022-06-05 NOTE — ED Provider Notes (Signed)
[facility] Provider Note    Event Date/Time   First MD Initiated Contact with Patient 06/05/22 2259     (approximate)   History   Abdominal Pain and Fever   HPI  Elizabeth [name] is a 71 y.o. female who presents to the ED from home with a chief complaint of generalized weakness, fatigue, fever, nausea/vomiting.  Reports 5-6 episodes of vomiting since 4 PM.  Denies cough, chest pain, shortness of breath, dysuria or diarrhea.     Past Medical History   Past Medical History:  Diagnosis Date   Cancer [facility])    breast   Carcinoma of right breast ([place]) 05/20/2014   Diabetes mellitus without complication ([place])    Hypertension    Lymphedema    Personal history of radiation therapy 2015   Right breast   PONV (postoperative nausea and vomiting)    Psoriasis 1990   Stroke ([place]) 2000   residual left arm weakness   Varicose veins      Active Problem List   Patient Active Problem List   Diagnosis Date Noted   Sepsis due to gram-negative UTI ([place]) 06/06/2022   Lymphedema 10/10/2021   TIA (transient ischemic attack) 04/27/2021   Diabetic polyneuropathy associated with type 2 diabetes mellitus ([place]) 04/07/2021   Cellulitis in diabetic foot ([place]) 03/03/2021   Sepsis ([place]) 05/10/2018   Pressure injury of skin 05/10/2018   Malignant neoplasm of female breast ([place]) 02/02/2017   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   Type 2 diabetes mellitus with peripheral vascular disease ([place]) 01/28/2017   Cellulitis 01/22/2017   Hypokalemia 01/22/2017   Morbid obesity ([place]) 09/23/2015   Biliary sludge 07/23/2015   Steatosis of liver 07/23/2015   Diabetes mellitus, type 2 ([place]) 05/21/2015   Peripheral vascular disease ([place]) 05/21/2015   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 ([place]) 05/21/2015   Chronic venous insufficiency 02/05/2015   Mixed incontinence 10/24/2014   Extreme obesity 10/24/2014   Hernia, rectovaginal 10/24/2014   Mixed  stress and urge urinary incontinence 10/24/2014   Airway hyperreactivity 02/19/2013   Cerebral vascular accident ([place]) 02/19/2013   Clinical depression 02/19/2013   Diabetes mellitus type 2 in obese ([place]) 02/19/2013   Colon, diverticulosis 02/19/2013   Acid reflux 02/19/2013   HLD (hyperlipidemia) 02/19/2013   BP (high blood pressure) 02/19/2013   Cerebral infarction, unspecified ([place]) 02/19/2013   Gastro-esophageal reflux disease without esophagitis 02/19/2013   Unspecified asthma, uncomplicated 33/29/5188   Depression, unspecified 02/19/2013   Diverticulosis of large intestine without perforation or abscess without bleeding 02/19/2013     Past Surgical History   Past Surgical History:  Procedure Laterality Date   AMPUTATION Left 03/04/2021   Procedure: AMPUTATION RAY;  Surgeon: Sharlotte Alamo, DPM;  Location: [facility] ORS;  Service: Podiatry;  Laterality: Left;   AMPUTATION TOE Right 01/25/2017   Procedure: AMPUTATION TOE   ;  Surgeon: Albertine Patricia, DPM;  Location: [facility] ORS;  Service: Podiatry;  Laterality: Right;   BREAST EXCISIONAL BIOPSY Right 2015   [facility] / with radiation   BREAST LUMPECTOMY Right 04-2014   followed by radiation,  no chemo   BREAST REDUCTION SURGERY  1977   CHOLECYSTECTOMY N/A 09/23/2015   Procedure: LAPAROSCOPIC CHOLECYSTECTOMY;  Surgeon: Bonner Puna, MD;  Location: [facility] ORS;  Service: General;  Laterality: N/A;   DILATION AND CURETTAGE OF UTERUS  2010   EYE SURGERY Left 2015   cataracts and left eye detached retina repair   EYE SURGERY  Right 2013   cataract with len implant   GASTRIC ROUX-EN-Y N/A 09/23/2015   Procedure: LAPAROSCOPIC ROUX-EN-Y GASTRIC;  Surgeon: Bonner Puna, MD;  Location: [facility] ORS;  Service: General;  Laterality: N/A;   HIATAL HERNIA REPAIR N/A 09/23/2015   Procedure: LAPAROSCOPIC REPAIR OF HIATAL HERNIA;  Surgeon: Bonner Puna, MD;  Location: [facility] OR[facility];  Service: General;  Laterality: N/A;   IRRIGATION AND DEBRIDEMENT FOOT Right 01/25/2017    Procedure: IRRIGATION AND DEBRIDEMENT FOOT;  Surgeon: Albertine Patricia, DPM;  Location: [facility] OR[facility];  Service: Podiatry;  Laterality: Right;   IRRIGATION AND DEBRIDEMENT FOOT Right 01/29/2017   Procedure: IRRIGATION AND DEBRIDEMENT FOOT and application of wound vac;  Surgeon: Albertine Patricia, DPM;  Location: [facility];  Service: Podiatry;  Laterality: Right;   LOWER EXTREMITY ANGIOGRAPHY Right 01/28/2017   Procedure: Lower Extremity Angiography;  Surgeon: Katha Cabal, MD;  Location: [place] [facility];  Service: Cardiovascular;  Laterality: Right;   LOWER EXTREMITY ANGIOGRAPHY Left 03/06/2021   Procedure: Lower Extremity Angiography;  Surgeon: Katha Cabal, MD;  Location: [place] [facility];  Service: Cardiovascular;  Laterality: Left;   LOWER EXTREMITY INTERVENTION  01/28/2017   Procedure: Lower Extremity Intervention;  Surgeon: Katha Cabal, MD;  Location: [place] [facility];  Service: Cardiovascular;;   REDUCTION MAMMAPLASTY       Home Medications   Prior to Admission medications   Medication Sig Start Date End Date Taking? Authorizing Provider  acetaminophen (TYLENOL) 325 MG tablet Take 2 tablets (650 mg total) by mouth every 6 (six) hours as needed for mild pain (or Fever >/= 101). 03/07/21   Nicole Kindred A, DO  albuterol (PROVENTIL HFA;VENTOLIN HFA) 108 (90 BASE) MCG/ACT inhaler Inhale 2 puffs into the lungs every 6 (six) hours as needed for wheezing or shortness of breath. Patient not taking: Reported on 05/17/2022    [provider]  Calcium Carb-Cholecalciferol (CALCIUM 1000 + D PO) Take 1 tablet by mouth daily. Patient not taking: Reported on 05/17/2022    [provider]  clopidogrel (PLAVIX) 75 MG tablet Take 75 mg by mouth daily. 11/06/15   [provider]  furosemide (LASIX) 40 MG tablet Take 0.5 tablets (20 mg total) by mouth daily. 07/27/21   Kate Sable, MD  gabapentin (NEURONTIN) 300 MG capsule Take 300 mg by mouth 3 (three)  times daily.  Patient not taking: Reported on 05/17/2022    [provider]  linaclotide Rolan Lipa) 145 MCG CAPS capsule  02/23/22   [provider]  linagliptin (TRADJENTA) 5 MG TABS tablet Take 5 mg by mouth daily.    [provider]  lisinopril (ZESTRIL) 10 MG tablet Take 1 tablet (10 mg total) by mouth daily. 07/27/21   Kate Sable, MD  metFORMIN (GLUCOPHAGE) 1000 MG tablet Take 1,000 mg by mouth daily with breakfast.     [provider]  PARoxetine (PAXIL) 40 MG tablet Take 40 mg by mouth daily. Patient not taking: Reported on 05/17/2022    [provider]  potassium chloride (MICRO-K) 10 MEQ CR capsule     [provider]  pravastatin (PRAVACHOL) 40 MG tablet Take 40 mg by mouth daily. In afternoon    [provider]  triamcinolone ointment (KENALOG) 0.5 % Apply 1 application topically 2 (two) times daily. Patient not taking: Reported on 05/17/2022 04/07/21   Kris Hartmann, NP  TRUE [facility] BLOOD GLUCOSE TEST test strip  10/13/18   [provider]  TRUEPLUS LANCETS 33G  [place]  10/13/18   [provider]     Allergies  Patient has no known allergies.   Family History   Family History  Problem Relation Age of Onset   Diabetes Mother    Cancer Mother    Breast cancer Mother 62   Diabetes Father    Diabetes Sister    Diabetes Brother      Physical Exam  Triage Vital Signs: ED Triage Vitals  Enc Vitals Group     BP 06/05/22 2225 129/62     Pulse Rate 06/05/22 2225 75     Resp 06/05/22 2225 18     Temp 06/05/22 2225 (!) 102 F (38.9 C)     Temp Source 06/05/22 2225 Oral     SpO2 06/05/22 2225 96 %     Weight 06/05/22 2224 205 lb (93 kg)     Height 06/05/22 2224 '5\' 3"'$  (1.6 m)     Head Circumference --      Peak Flow --      Pain Score 06/05/22 2229 0     Pain Loc --      Pain Edu? --      Excl. in [place]? --     Updated Vital Signs: BP (!) 96/54 (BP Location: Right Arm)   Pulse 64   Temp  98.4 F (36.9 C) (Oral)   Resp 16   Ht '5\' 3"'$  (1.6 m)   Wt 92.2 kg   SpO2 99%   BMI 36.01 kg/m    General: Awake, mild distress.  CV:  RRR.  Good peripheral perfusion.  Resp:  Normal effort.  CTA B. Abd:  Nontender to light or deep palpation.  No distention.  Other:  No truncal vesicles.   ED Results / Procedures / Treatments  Labs (all labs ordered are listed, but only abnormal results are displayed) Labs Reviewed  COMPREHENSIVE METABOLIC PANEL - Abnormal; Notable for the following components:      Result Value   Glucose, Bld 190 (*)    BUN 24 (*)    Creatinine, Ser 1.56 (*)    Calcium 8.2 (*)    Albumin 2.8 (*)    AST 13 (*)    GFR, Estimated 36 (*)    All other components within normal limits  CBC WITH DIFFERENTIAL/PLATELET - Abnormal; Notable for the following components:   WBC 12.9 (*)    Hemoglobin 9.8 (*)    HCT 31.5 (*)    MCH 25.3 (*)    RDW 16.5 (*)    Neutro Abs 11.2 (*)    Lymphs Abs 0.5 (*)    Monocytes Absolute 1.1 (*)    All other components within normal limits  URINALYSIS, ROUTINE W REFLEX MICROSCOPIC - Abnormal; Notable for the following components:   Color, Urine YELLOW (*)    APPearance CLOUDY (*)    Hgb urine dipstick SMALL (*)    Protein, ur 30 (*)    Nitrite POSITIVE (*)    Leukocytes,Ua LARGE (*)    WBC, UA >50 (*)    Bacteria, UA MANY (*)    All other components within normal limits  CBC - Abnormal; Notable for the following components:   WBC 11.0 (*)    RBC 3.16 (*)    Hemoglobin 8.2 (*)    HCT 25.9 (*)    MCH 25.9 (*)    RDW 16.4 (*)    All other components within normal limits  SARS CORONAVIRUS 2 BY RT PCR  CULTURE,  BLOOD (ROUTINE X 2)  CULTURE, BLOOD (ROUTINE X 2)  LACTIC ACID, PLASMA  PROTIME-INR  PROCALCITONIN  LIPASE, BLOOD  PROTIME-INR  HIV ANTIBODY (ROUTINE TESTING W REFLEX)  CORTISOL-AM, BLOOD  PROCALCITONIN  BASIC METABOLIC PANEL  TROPONIN I (HIGH SENSITIVITY)     EKG  ED ECG REPORT I, Brenly Trawick J, the  attending physician, personally viewed and interpreted this ECG.   Date: 06/05/2022  EKG Time: 2334  Rate: 67  Rhythm: normal sinus rhythm  Axis: Normal  Intervals:none  ST&T Change: Nonspecific    RADIOLOGY I have independently visualized and interpreted patient's x-ray and CT scan as well as noted the radiology interpretation:  Chest x-ray: No acute cardiopulmonary process  CT abdomen/pelvis: Left renal stranding without visible stone  Official radiology report(s): CT Abdomen Pelvis Wo Contrast  Result Date: 06/06/2022 CLINICAL DATA:  Fever, nausea and vomiting. EXAM: CT ABDOMEN AND PELVIS WITHOUT CONTRAST TECHNIQUE: Multidetector CT imaging of the abdomen and pelvis was performed following the standard protocol without IV contrast. RADIATION DOSE REDUCTION: This exam was performed according to the departmental dose-optimization program which includes automated exposure control, adjustment of the mA and/or kV according to patient size and/or use of iterative reconstruction technique. COMPARISON:  None Available. FINDINGS: Lower chest: Bases are clear. The heart is slightly enlarged. The intraventricular blood pool is low in density consistent with anemia. There are three-vessel coronary artery calcifications. No pericardial effusion. Small hiatal hernia. Hepatobiliary: The liver is 21 cm in length, not well seen due to streak artifact from the patient's right arm in the field but no gross abnormality is evident. There are vascular calcifications. Surgically absent gallbladder without biliary dilatation. Pancreas: Moderately atrophic and otherwise unremarkable. Spleen: Mildly prominent, 13.6 cm transverse. No focal abnormality without contrast. Adrenals/Urinary Tract: There is no adrenal mass. No focal abnormality in the unenhanced renal cortex. Bilateral intrarenal renovascular calcifications are noted but no collecting system stones. There is asymmetric left perinephric stranding and mild  left hydroureteronephrosis to the bladder base but no ureteral or intravesical stone. There is no bladder thickening. Pelvic floor laxity with both ureters inserting low in the pelvis. Small cystocele extending below the level of the pubococcygeal line. 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 Small bowel anastomosis left mid abdomen. The appendix is normal. Moderate fecal stasis. Scattered sigmoid diverticula without diverticulitis. Vascular/Lymphatic: Extensive vascular calcifications, with aortoiliac and visceral branch arterial diffuse atherosclerotic changes. Additional vascular calcifications involve the outer uterine wall. There are numerous pelvic phleboliths. Reproductive: The uterus is intact.  No adnexal masses seen. Other: Small umbilical and inguinal fat hernias. There is no free air, free hemorrhage or free fluid. Musculoskeletal: Osteopenia mild degenerative change lumbar spine. No acute or significant osseous findings. IMPRESSION: 1. Asymmetric left renal edema/stranding and mild hydroureteronephrosis without visible stone. The findings could be due to a recently passed stone or ascending UTI. 2. No bladder thickening or intravesical stone, with pelvic floor laxity and cystocele noted. 3. Mildly prominent liver and spleen. 4. Constipation and diverticulosis. 5. Extensive vascular calcifications. 6. Decreased density of the intraventricular blood in the heart consistent with anemia. Electronically Signed   By: Telford Nab M.D.   On: 06/06/2022 00:54   DG Chest Port 1 View  Result Date: 06/05/2022 CLINICAL DATA:  Vomiting and possible sepsis, initial encounter EXAM: PORTABLE CHEST 1 VIEW COMPARISON:  03/03/2021 FINDINGS: Cardiac shadow is enlarged but stable. The lungs are well aerated without focal infiltrate or effusion. No bony abnormality is noted. IMPRESSION: No acute abnormality noted. Electronically Signed  By: Inez Catalina M.D.   On: 06/05/2022 23:14     PROCEDURES:  Critical Care  performed: Yes, see critical care procedure note(s)  CRITICAL CARE Performed by: Paulette Blanch   Total critical care time: 45 minutes  Critical care time was exclusive of separately billable procedures and treating other patients.  Critical care was necessary to treat or prevent imminent or life-threatening deterioration.  Critical care was time spent personally by me on the following activities: development of treatment plan with patient and/or surrogate as well as nursing, discussions with consultants, evaluation of patient's response to treatment, examination of patient, obtaining history from patient or surrogate, ordering and performing treatments and interventions, ordering and review of laboratory studies, ordering and review of radiographic studies, pulse oximetry and re-evaluation of patient's condition.   [name]1-3 Lead EKG Interpretation  Performed by: Paulette Blanch, MD Authorized by: Paulette Blanch, MD     Interpretation: normal     ECG rate:  75   ECG rate assessment: normal     Rhythm: sinus rhythm     Ectopy: none     Conduction: normal   Comments:     Patient placed cardiac monitor to evaluate for arrhythmias    MEDICATIONS ORDERED IN ED: Medications  pravastatin (PRAVACHOL) tablet 40 mg (has no administration in time range)  linagliptin (TRADJENTA) tablet 5 mg (has no administration in time range)  linaclotide (LINZESS) capsule 145 mcg (has no administration in time range)  clopidogrel (PLAVIX) tablet 75 mg (has no administration in time range)  potassium chloride SA (KLOR-CON M) CR tablet 20 mEq (has no administration in time range)  enoxaparin (LOVENOX) injection 40 mg (has no administration in time range)  cefTRIAXone (ROCEPHIN) 2 g in sodium chloride 0.9 % 100 mL IVPB (has no administration in time range)  acetaminophen (TYLENOL) tablet 650 mg (has no administration in time range)    Or  acetaminophen (TYLENOL) suppository 650 mg (has no administration in time  range)  traZODone (DESYREL) tablet 25 mg (has no administration in time range)  magnesium hydroxide (MILK OF MAGNESIA) suspension 30 mL (has no administration in time range)  ondansetron (ZOFRAN) tablet 4 mg (has no administration in time range)    Or  ondansetron (ZOFRAN) injection 4 mg (has no administration in time range)  escitalopram (LEXAPRO) tablet 10 mg (has no administration in time range)  ondansetron (ZOFRAN-ODT) disintegrating tablet 4 mg (4 mg Oral Given 06/05/22 2234)  acetaminophen (TYLENOL) tablet 650 mg (650 mg Oral Given 06/05/22 2246)  ondansetron (ZOFRAN) injection 4 mg (4 mg Intravenous Given 06/05/22 2259)  sodium chloride 0.9 % bolus 1,000 mL (0 mLs Intravenous Stopped 06/06/22 0100)  cefTRIAXone (ROCEPHIN) 1 g in sodium chloride 0.9 % 100 mL IVPB (0 g Intravenous Stopped 06/06/22 0004)  acetaminophen (TYLENOL) tablet 325 mg (325 mg Oral Given 06/05/22 2350)  sodium chloride 0.9 % bolus 600 mL (0 mLs Intravenous Stopped 06/06/22 0202)  cefTRIAXone (ROCEPHIN) 1 g in sodium chloride 0.9 % 100 mL IVPB (0 g Intravenous Stopped 06/06/22 0327)     IMPRESSION / MDM / ASSESSMENT AND PLAN / ED COURSE  I reviewed the triage vital signs and the nursing notes.                             71 year old female presenting with generalized weakness, fatigue, fever, nausea/vomiting. Differential diagnosis includes, but is not limited to, ovarian cyst, ovarian torsion, acute appendicitis, diverticulitis,  urinary tract infection/pyelonephritis, endometriosis, bowel obstruction, colitis, renal colic, gastroenteritis, hernia, fibroids, endometriosis, pregnancy related pain including ectopic pregnancy, etc. I have personally reviewed patient's records and note her vascular surgery office visit for lymphedema on 05/17/2022.  Patient's presentation is most consistent with acute presentation with potential threat to life or bodily function.  The patient is on the cardiac monitor to evaluate for evidence  of arrhythmia and/or significant heart rate changes.  Laboratory results demonstrate mild leukocytosis with WBC 12.9, mild AKI BUN 24/creatinine 1.56, nitrite and leukocyte positive UA with greater than 50 WBC.  Will obtain blood cultures, lactic acid, procalcitonin, COVID 19.  Obtain CT abdomen/pelvis to evaluate for intra-abdominal etiology.  Patient feeling better after Zofran ODT as well as via IV prior to our examination.  She also received Tylenol for her fever.  Will initiate IV fluid hydration, IV Rocephin and reassess.  Clinical Course as of 06/06/22 0449  Sun Jun 06, 2022  0100 Although lactic acid is unremarkable, procalcitonin is positive.  ED code sepsis initiated.  Will administer additional IV fluids and additional 1 g IV Rocephin.  Will consult hospitalist services for evaluation and admission. [JS]    Clinical Course User Index [JS] Paulette Blanch, MD     FINAL CLINICAL IMPRESSION(S) / ED DIAGNOSES   Final diagnoses:  Lower urinary tract infectious disease  Fever, unspecified fever cause  Sepsis, due to unspecified organism, unspecified whether acute organ dysfunction present [facility])     Rx / DC Orders   ED Discharge Orders     None        Note:  This document was prepared using Dragon voice recognition software and may include unintentional dictation errors.   Paulette Blanch, MD 06/06/22 [phone]

## 2022-06-05 NOTE — ED Triage Notes (Signed)
Pt reports for the past couple of days she has been feeling tired and fatigued today she developed a fever, pt reports she did not take any medication for fever at home. Pt reports she started vomiting today around 1600. Pt talks in complete sentences no  respiratory distress noted

## 2022-06-05 NOTE — ED Notes (Signed)
Rainbow, lactic, and 1st set of [facility] sent to lab

## 2022-06-05 NOTE — ED Notes (Signed)
ED Provider at bedside. 

## 2022-06-06 ENCOUNTER — Encounter: Payer: Self-pay | Admitting: Family Medicine

## 2022-06-06 DIAGNOSIS — N136 Pyonephrosis: Secondary | ICD-10-CM | POA: Diagnosis present

## 2022-06-06 DIAGNOSIS — N179 Acute kidney failure, unspecified: Secondary | ICD-10-CM | POA: Diagnosis present

## 2022-06-06 DIAGNOSIS — I129 Hypertensive chronic kidney disease with stage 1 through stage 4 chronic kidney disease, or unspecified chronic kidney disease: Secondary | ICD-10-CM | POA: Diagnosis present

## 2022-06-06 DIAGNOSIS — I10 Essential (primary) hypertension: Secondary | ICD-10-CM | POA: Diagnosis present

## 2022-06-06 DIAGNOSIS — N12 Tubulo-interstitial nephritis, not specified as acute or chronic: Secondary | ICD-10-CM

## 2022-06-06 DIAGNOSIS — I8390 Asymptomatic varicose veins of unspecified lower extremity: Secondary | ICD-10-CM | POA: Diagnosis present

## 2022-06-06 DIAGNOSIS — A4151 Sepsis due to Escherichia coli [E. coli]: Secondary | ICD-10-CM | POA: Diagnosis present

## 2022-06-06 DIAGNOSIS — D631 Anemia in chronic kidney disease: Secondary | ICD-10-CM | POA: Diagnosis present

## 2022-06-06 DIAGNOSIS — R509 Fever, unspecified: Secondary | ICD-10-CM | POA: Diagnosis not present

## 2022-06-06 DIAGNOSIS — I2510 Atherosclerotic heart disease of native coronary artery without angina pectoris: Secondary | ICD-10-CM | POA: Diagnosis present

## 2022-06-06 DIAGNOSIS — Z9049 Acquired absence of other specified parts of digestive tract: Secondary | ICD-10-CM | POA: Diagnosis not present

## 2022-06-06 DIAGNOSIS — N1330 Unspecified hydronephrosis: Secondary | ICD-10-CM | POA: Diagnosis present

## 2022-06-06 DIAGNOSIS — E785 Hyperlipidemia, unspecified: Secondary | ICD-10-CM | POA: Diagnosis present

## 2022-06-06 DIAGNOSIS — E1142 Type 2 diabetes mellitus with diabetic polyneuropathy: Secondary | ICD-10-CM | POA: Diagnosis present

## 2022-06-06 DIAGNOSIS — Z8673 Personal history of transient ischemic attack (TIA), and cerebral infarction without residual deficits: Secondary | ICD-10-CM

## 2022-06-06 DIAGNOSIS — N1832 Chronic kidney disease, stage 3b: Secondary | ICD-10-CM | POA: Diagnosis present

## 2022-06-06 DIAGNOSIS — E1122 Type 2 diabetes mellitus with diabetic chronic kidney disease: Secondary | ICD-10-CM | POA: Diagnosis present

## 2022-06-06 DIAGNOSIS — R112 Nausea with vomiting, unspecified: Secondary | ICD-10-CM | POA: Diagnosis present

## 2022-06-06 DIAGNOSIS — Z833 Family history of diabetes mellitus: Secondary | ICD-10-CM | POA: Diagnosis not present

## 2022-06-06 DIAGNOSIS — Z7902 Long term (current) use of antithrombotics/antiplatelets: Secondary | ICD-10-CM | POA: Diagnosis not present

## 2022-06-06 DIAGNOSIS — E669 Obesity, unspecified: Secondary | ICD-10-CM | POA: Diagnosis present

## 2022-06-06 DIAGNOSIS — Z7984 Long term (current) use of oral hypoglycemic drugs: Secondary | ICD-10-CM | POA: Diagnosis not present

## 2022-06-06 DIAGNOSIS — Z20822 Contact with and (suspected) exposure to covid-19: Secondary | ICD-10-CM | POA: Diagnosis present

## 2022-06-06 DIAGNOSIS — N189 Chronic kidney disease, unspecified: Secondary | ICD-10-CM

## 2022-06-06 DIAGNOSIS — N390 Urinary tract infection, site not specified: Secondary | ICD-10-CM | POA: Diagnosis present

## 2022-06-06 DIAGNOSIS — A419 Sepsis, unspecified organism: Secondary | ICD-10-CM | POA: Diagnosis not present

## 2022-06-06 DIAGNOSIS — Z803 Family history of malignant neoplasm of breast: Secondary | ICD-10-CM | POA: Diagnosis not present

## 2022-06-06 DIAGNOSIS — L409 Psoriasis, unspecified: Secondary | ICD-10-CM | POA: Diagnosis present

## 2022-06-06 DIAGNOSIS — I69334 Monoplegia of upper limb following cerebral infarction affecting left non-dominant side: Secondary | ICD-10-CM | POA: Diagnosis not present

## 2022-06-06 DIAGNOSIS — Z853 Personal history of malignant neoplasm of breast: Secondary | ICD-10-CM | POA: Diagnosis not present

## 2022-06-06 DIAGNOSIS — A4150 Gram-negative sepsis, unspecified: Secondary | ICD-10-CM

## 2022-06-06 DIAGNOSIS — Z79899 Other long term (current) drug therapy: Secondary | ICD-10-CM | POA: Diagnosis not present

## 2022-06-06 DIAGNOSIS — Z923 Personal history of irradiation: Secondary | ICD-10-CM | POA: Diagnosis not present

## 2022-06-06 LAB — BASIC METABOLIC PANEL
Anion gap: 6 (ref 5–15)
BUN: 22 mg/dL (ref 8–23)
CO2: 23 mmol/L (ref 22–32)
Calcium: 7.9 mg/dL — ABNORMAL LOW (ref 8.9–10.3)
Chloride: 107 mmol/L (ref 98–111)
Creatinine, Ser: 1.41 mg/dL — ABNORMAL HIGH (ref 0.44–1.00)
GFR, Estimated: 40 mL/min — ABNORMAL LOW (ref >60)
Glucose, Bld: 219 mg/dL — ABNORMAL HIGH (ref 70–99)
Potassium: 3.9 mmol/L (ref 3.5–5.1)
Sodium: 136 mmol/L (ref 135–145)

## 2022-06-06 LAB — CBC
HCT: 25.9 % — ABNORMAL LOW (ref 36.0–46.0)
Hemoglobin: 8.2 g/dL — ABNORMAL LOW (ref 12.0–15.0)
MCH: 25.9 pg — ABNORMAL LOW (ref 26.0–34.0)
MCHC: 31.7 g/dL (ref 30.0–36.0)
MCV: 82 fL (ref 80.0–100.0)
Platelets: 260 10*3/uL (ref 150–400)
RBC: 3.16 MIL/uL — ABNORMAL LOW (ref 3.87–5.11)
RDW: 16.4 % — ABNORMAL HIGH (ref 11.5–15.5)
WBC: 11 10*3/uL — ABNORMAL HIGH (ref 4.0–10.5)
nRBC: 0 % (ref 0.0–0.2)

## 2022-06-06 LAB — HIV ANTIBODY (ROUTINE TESTING W REFLEX): HIV Screen 4th Generation wRfx: NONREACTIVE

## 2022-06-06 LAB — CORTISOL-AM, BLOOD: Cortisol - AM: 8.7 ug/dL (ref 6.7–22.6)

## 2022-06-06 LAB — PROTIME-INR
INR: 1.2 (ref 0.8–1.2)
Prothrombin Time: 14.7 seconds (ref 11.4–15.2)

## 2022-06-06 LAB — PROCALCITONIN: Procalcitonin: 1.32 ng/mL

## 2022-06-06 LAB — SARS CORONAVIRUS 2 BY RT PCR: SARS Coronavirus 2 by RT PCR: NEGATIVE

## 2022-06-06 MED ORDER — ESCITALOPRAM OXALATE 10 MG PO TABS
10.0000 mg | ORAL_TABLET | Freq: Every day | ORAL | Status: DC
  Filled 2022-06-06 (×2): qty 1

## 2022-06-06 MED ORDER — ONDANSETRON HCL 4 MG PO TABS: 4.0000 mg | ORAL_TABLET | Freq: Four times a day (QID) | ORAL | Status: DC | PRN

## 2022-06-06 MED ORDER — SODIUM CHLORIDE 0.9 % IV BOLUS (SEPSIS): 1000.0000 mL | Freq: Once | INTRAVENOUS | Status: DC

## 2022-06-06 MED ORDER — ACETAMINOPHEN 650 MG RE SUPP: 650.0000 mg | Freq: Four times a day (QID) | RECTAL | Status: DC | PRN

## 2022-06-06 MED ORDER — LINACLOTIDE 145 MCG PO CAPS
145.0000 ug | ORAL_CAPSULE | Freq: Every day | ORAL | Status: DC
  Administered 2022-06-09: 145 ug via ORAL
  Filled 2022-06-06 (×5): qty 1

## 2022-06-06 MED ORDER — PRAVASTATIN SODIUM 20 MG PO TABS
40.0000 mg | ORAL_TABLET | Freq: Every day | ORAL | Status: DC
  Administered 2022-06-06 – 2022-06-09 (×4): 40 mg via ORAL
  Filled 2022-06-06 (×4): qty 2

## 2022-06-06 MED ORDER — SODIUM CHLORIDE 0.9 % IV SOLN
2.0000 g | INTRAVENOUS | Status: AC
  Administered 2022-06-07 – 2022-06-09 (×3): 2 g via INTRAVENOUS
  Filled 2022-06-06: qty 2
  Filled 2022-06-06: qty 20
  Filled 2022-06-06: qty 2

## 2022-06-06 MED ORDER — POTASSIUM CHLORIDE CRYS ER 20 MEQ PO TBCR
20.0000 meq | EXTENDED_RELEASE_TABLET | Freq: Every day | ORAL | Status: DC
  Administered 2022-06-06 – 2022-06-07 (×2): 20 meq via ORAL
  Filled 2022-06-06 (×2): qty 1

## 2022-06-06 MED ORDER — ACETAMINOPHEN 325 MG PO TABS
650.0000 mg | ORAL_TABLET | Freq: Four times a day (QID) | ORAL | Status: DC | PRN
  Administered 2022-06-06: 650 mg via ORAL
  Filled 2022-06-06: qty 2

## 2022-06-06 MED ORDER — ATROPINE SULFATE 1 MG/ML IV SOLN
0.5000 mg | Freq: Once | INTRAVENOUS | Status: DC
Start: 2022-06-06 — End: 2022-06-08
  Filled 2022-06-06: qty 0.5

## 2022-06-06 MED ORDER — TRAZODONE HCL 50 MG PO TABS: 25.0000 mg | ORAL_TABLET | Freq: Every evening | ORAL | Status: DC | PRN

## 2022-06-06 MED ORDER — MAGNESIUM HYDROXIDE 400 MG/5ML PO SUSP: 30.0000 mL | Freq: Every day | ORAL | Status: DC | PRN

## 2022-06-06 MED ORDER — SODIUM CHLORIDE 0.9 % IV BOLUS (SEPSIS)
600.0000 mL | Freq: Once | INTRAVENOUS | Status: AC
  Administered 2022-06-06: 600 mL via INTRAVENOUS

## 2022-06-06 MED ORDER — SODIUM CHLORIDE 0.9 % IV SOLN: 2.0000 g | INTRAVENOUS | Status: DC

## 2022-06-06 MED ORDER — ONDANSETRON HCL 4 MG/2ML IJ SOLN
4.0000 mg | Freq: Four times a day (QID) | INTRAMUSCULAR | Status: DC | PRN
  Administered 2022-06-07: 4 mg via INTRAVENOUS
  Filled 2022-06-06: qty 2

## 2022-06-06 MED ORDER — SODIUM CHLORIDE 0.9 % IV SOLN: INTRAVENOUS | Status: DC

## 2022-06-06 MED ORDER — CEFTRIAXONE SODIUM 1 G IJ SOLR
1.0000 g | Freq: Once | INTRAMUSCULAR | Status: DC
Start: 2022-06-06 — End: 2022-06-06

## 2022-06-06 MED ORDER — ENOXAPARIN SODIUM 40 MG/0.4ML IJ SOSY
40.0000 mg | PREFILLED_SYRINGE | INTRAMUSCULAR | Status: DC
Start: 2022-06-06 — End: 2022-06-08
  Administered 2022-06-06 – 2022-06-08 (×3): 40 mg via SUBCUTANEOUS
  Filled 2022-06-06 (×3): qty 0.4

## 2022-06-06 MED ORDER — SODIUM CHLORIDE 0.9 % IV SOLN
INTRAVENOUS | Status: AC
Start: 2022-06-06 — End: 2022-06-06

## 2022-06-06 MED ORDER — PAROXETINE HCL 20 MG PO TABS: 40.0000 mg | ORAL_TABLET | Freq: Every day | ORAL | Status: DC

## 2022-06-06 MED ORDER — CLOPIDOGREL BISULFATE 75 MG PO TABS
75.0000 mg | ORAL_TABLET | Freq: Every day | ORAL | Status: DC
  Administered 2022-06-06 – 2022-06-09 (×4): 75 mg via ORAL
  Filled 2022-06-06 (×4): qty 1

## 2022-06-06 MED ORDER — SODIUM CHLORIDE 0.9 % IV SOLN
1.0000 g | Freq: Once | INTRAVENOUS | Status: AC
  Administered 2022-06-06: 1 g via INTRAVENOUS
  Filled 2022-06-06: qty 10

## 2022-06-06 MED ORDER — LINAGLIPTIN 5 MG PO TABS
5.0000 mg | ORAL_TABLET | Freq: Every day | ORAL | Status: DC
  Administered 2022-06-06 – 2022-06-09 (×4): 5 mg via ORAL
  Filled 2022-06-06 (×4): qty 1

## 2022-06-06 NOTE — Assessment & Plan Note (Signed)
-   The patient will be placed on supplement coverage with NovoLog. - We will hold off metformin. - We will continue Neurontin.

## 2022-06-06 NOTE — Sepsis Progress Note (Signed)
Following per sepsis protocol   

## 2022-06-06 NOTE — Plan of Care (Signed)

## 2022-06-06 NOTE — Assessment & Plan Note (Signed)
-  Urology recommends broad-spectrum antibiotic and total 2 weeks of total course of antibiotics Outpatient urology follow-up.  No procedure planned while in the hospital

## 2022-06-06 NOTE — H&P (Signed)
[place]   PATIENT NAME: Elizabeth [name]    MR#:  [id number]  DATE OF BIRTH:  02/14/1951  DATE OF ADMISSION:  06/05/2022  PRIMARY CARE PHYSICIAN: Marguerita Merles, MD   Patient is coming from: Home  REQUESTING/REFERRING PHYSICIAN: Lurline Hare, MD  CHIEF COMPLAINT:   Chief Complaint  Patient presents with   Abdominal Pain   Fever    HISTORY OF PRESENT ILLNESS:  Elizabeth [name] is a 71 y.o. Caucasian female with medical history significant for type diabetes mellitus, hypertension, psoriasis, CVA and breast cancer, who presented to the ER with acute onset of fever with mild chills as well as recurrent nausea and vomiting x6 at home.  She admitted to urinary frequency and urgency without flank pain.  She has been having diarrhea as well.  No bilious vomitus or hematemesis.  No melena or bright red bleeding per rectum.  No cough or wheezing or dyspnea.  No chest pain or palpitations.  ED Course: When she came to the ER temperature was 102 with otherwise normal vital signs.  Later respiratory rate was 24.  Labs revealed a creatinine of 1.56 up from 1.37 on 1/31 with a BUN of 24 calcium 8.2, albumin 2.8 and total protein 6.9.  Lactic acid was 1.3 and procalcitonin 1.23.  CBC showed leukocytosis 12.9 with neutrophilia and anemia.  PT and INR were within normal. EKG as reviewed by me : EKG showed normal sinus rhythm with a rate of 67 with low voltage QRS. Imaging: Portable chest ray showed no acute cardiopulmonary disease. Abdominal pelvic CT scan revealed the following: 1. Asymmetric left renal edema/stranding and mild hydroureteronephrosis without visible stone. The findings could be due to a recently passed stone or ascending UTI. 2. No bladder thickening or intravesical stone, with pelvic floor laxity and cystocele noted. 3. Mildly prominent liver and spleen. 4. Constipation and diverticulosis. 5. Extensive vascular calcifications. 6. Decreased density of the intraventricular  blood in the heart consistent with anemia.  Patient was given 975 mg of p.o. Tylenol and a gram of IV Rocephin as well as 4 mg of IV Zofran twice.  She will be admitted to a medical telemetry bed for further evaluation and management.  PAST MEDICAL HISTORY:   Past Medical History:  Diagnosis Date   Cancer [facility])    breast   Carcinoma of right breast ([place]) 05/20/2014   Diabetes mellitus without complication ([place])    Hypertension    Lymphedema    Personal history of radiation therapy 2015   Right breast   PONV (postoperative nausea and vomiting)    Psoriasis 1990   Stroke ([place]) 2000   residual left arm weakness   Varicose veins     PAST SURGICAL HISTORY:   Past Surgical History:  Procedure Laterality Date   AMPUTATION Left 03/04/2021   Procedure: AMPUTATION RAY;  Surgeon: Sharlotte Alamo, DPM;  Location: [facility];  Service: Podiatry;  Laterality: Left;   AMPUTATION TOE Right 01/25/2017   Procedure: AMPUTATION TOE   ;  Surgeon: Albertine Patricia, DPM;  Location: [facility];  Service: Podiatry;  Laterality: Right;   BREAST EXCISIONAL BIOPSY Right 2015   [facility] / with radiation   BREAST LUMPECTOMY Right 04-2014   followed by radiation,  no chemo   BREAST REDUCTION SURGERY  1977   CHOLECYSTECTOMY N/A 09/23/2015   Procedure: LAPAROSCOPIC CHOLECYSTECTOMY;  Surgeon: Bonner Puna, MD;  Location: [facility] OR[facility];  Service: General;  Laterality: N/A;   DILATION AND  CURETTAGE OF UTERUS  2010   EYE SURGERY Left 2015   cataracts and left eye detached retina repair   EYE SURGERY Right 2013   cataract with len implant   GASTRIC ROUX-EN-Y N/A 09/23/2015   Procedure: LAPAROSCOPIC ROUX-EN-Y GASTRIC;  Surgeon: Bonner Puna, MD;  Location: [facility];  Service: General;  Laterality: N/A;   HIATAL HERNIA REPAIR N/A 09/23/2015   Procedure: LAPAROSCOPIC REPAIR OF HIATAL HERNIA;  Surgeon: Bonner Puna, MD;  Location: [facility];  Service: General;  Laterality: N/A;   IRRIGATION AND DEBRIDEMENT FOOT Right 01/25/2017    Procedure: IRRIGATION AND DEBRIDEMENT FOOT;  Surgeon: Albertine Patricia, DPM;  Location: [facility];  Service: Podiatry;  Laterality: Right;   IRRIGATION AND DEBRIDEMENT FOOT Right 01/29/2017   Procedure: IRRIGATION AND DEBRIDEMENT FOOT and application of wound vac;  Surgeon: Albertine Patricia, DPM;  Location: [facility];  Service: Podiatry;  Laterality: Right;   LOWER EXTREMITY ANGIOGRAPHY Right 01/28/2017   Procedure: Lower Extremity Angiography;  Surgeon: Katha Cabal, MD;  Location: [place] [facility];  Service: Cardiovascular;  Laterality: Right;   LOWER EXTREMITY ANGIOGRAPHY Left 03/06/2021   Procedure: Lower Extremity Angiography;  Surgeon: Katha Cabal, MD;  Location: [place] [facility];  Service: Cardiovascular;  Laterality: Left;   LOWER EXTREMITY INTERVENTION  01/28/2017   Procedure: Lower Extremity Intervention;  Surgeon: Katha Cabal, MD;  Location: [place] [facility];  Service: Cardiovascular;;   REDUCTION MAMMAPLASTY      SOCIAL HISTORY:   Social History   Tobacco Use   Smoking status: Never   Smokeless tobacco: Never  Substance Use Topics   Alcohol use: No    Alcohol/week: 0.0 standard drinks of alcohol    FAMILY HISTORY:   Family History  Problem Relation Age of Onset   Diabetes Mother    Cancer Mother    Breast cancer Mother 9   Diabetes Father    Diabetes Sister    Diabetes Brother     DRUG ALLERGIES:  No Known Allergies  REVIEW OF SYSTEMS:   ROS As per history of present illness. All pertinent systems were reviewed above. Constitutional, HEENT, cardiovascular, respiratory, GI, GU, musculoskeletal, neuro, psychiatric, endocrine, integumentary and hematologic systems were reviewed and are otherwise negative/unremarkable except for positive findings mentioned above in the HPI.   MEDICATIONS AT HOME:   Prior to Admission medications   Medication Sig Start Date End Date Taking? Authorizing Provider  acetaminophen (TYLENOL) 325 MG tablet Take  2 tablets (650 mg total) by mouth every 6 (six) hours as needed for mild pain (or Fever >/= 101). 03/07/21   Nicole Kindred A, DO  albuterol (PROVENTIL HFA;VENTOLIN HFA) 108 (90 BASE) MCG/ACT inhaler Inhale 2 puffs into the lungs every 6 (six) hours as needed for wheezing or shortness of breath. Patient not taking: Reported on 05/17/2022    [provider]  Calcium Carb-Cholecalciferol (CALCIUM 1000 + D PO) Take 1 tablet by mouth daily. Patient not taking: Reported on 05/17/2022    [provider]  clopidogrel (PLAVIX) 75 MG tablet Take 75 mg by mouth daily. 11/06/15   [provider]  furosemide (LASIX) 40 MG tablet Take 0.5 tablets (20 mg total) by mouth daily. 07/27/21   Kate Sable, MD  gabapentin (NEURONTIN) 300 MG capsule Take 300 mg by mouth 3 (three) times daily.  Patient not taking: Reported on 05/17/2022    [provider]  linaclotide Rolan Lipa) 145 MCG CAPS capsule  02/23/22   [provider]  linagliptin (TRADJENTA) 5 MG TABS tablet Take 5 mg by mouth daily.    [provider]  lisinopril (ZESTRIL) 10 MG tablet Take 1 tablet (10 mg total) by mouth daily. 07/27/21   Kate Sable, MD  metFORMIN (GLUCOPHAGE) 1000 MG tablet Take 1,000 mg by mouth daily with breakfast.     [provider]  PARoxetine (PAXIL) 40 MG tablet Take 40 mg by mouth daily. Patient not taking: Reported on 05/17/2022    [provider]  potassium chloride (MICRO-K) 10 MEQ CR capsule     [provider]  pravastatin (PRAVACHOL) 40 MG tablet Take 40 mg by mouth daily. In afternoon    [provider]  triamcinolone ointment (KENALOG) 0.5 % Apply 1 application topically 2 (two) times daily. Patient not taking: Reported on 05/17/2022 04/07/21   Kris Hartmann, NP  TRUE [facility] BLOOD GLUCOSE TEST test strip  10/13/18   [provider]  TRUEPLUS LANCETS 33G [place]  10/13/18   [provider]      VITAL SIGNS:   Blood pressure 110/63, pulse (!) 59, temperature (!) 97.4 F (36.3 C), resp. rate 16, height '5\' 3"'$  (1.6 m), weight 92.2 kg, SpO2 98 %.  PHYSICAL EXAMINATION:  Physical Exam  GENERAL:  71 y.o.-year-old patient lying in the bed with no acute distress.  EYES: Pupils equal, round, reactive to light and accommodation. No scleral icterus. Extraocular muscles intact.  HEENT: Head atraumatic, normocephalic. Oropharynx and nasopharynx clear.  NECK:  Supple, no jugular venous distention. No thyroid enlargement, no tenderness.  LUNGS: Normal breath sounds bilaterally, no wheezing, rales,rhonchi or crepitation. No use of accessory muscles of respiration.  CARDIOVASCULAR: Regular rate and rhythm, S1, S2 normal. No murmurs, rubs, or gallops.  ABDOMEN: Soft, nondistended, nontender. Bowel sounds present. No organomegaly or mass.  EXTREMITIES: No pedal edema, cyanosis, or clubbing.  NEUROLOGIC: Cranial nerves II through XII are intact. Muscle strength 5/5 in all extremities. Sensation intact. Gait not checked.  PSYCHIATRIC: The patient is alert and oriented x 3.  Normal affect and good eye contact. SKIN: No obvious rash, lesion, or ulcer.   LABORATORY PANEL:   CBC Recent Labs  Lab 06/06/22 0325  WBC 11.0*  HGB 8.2*  HCT 25.9*  PLT 260   ------------------------------------------------------------------------------------------------------------------  Chemistries  Recent Labs  Lab 06/05/22 2232 06/06/22 0325  NA 136 136  K 4.2 3.9  CL 105 107  CO2 23 23  GLUCOSE 190* 219*  BUN 24* 22  CREATININE 1.56* 1.41*  CALCIUM 8.2* 7.9*  AST 13*  --   ALT 9  --   ALKPHOS 77  --   BILITOT 0.7  --    ------------------------------------------------------------------------------------------------------------------  Cardiac Enzymes No results for input(s): "TROPONINI" in the last 168  hours. ------------------------------------------------------------------------------------------------------------------  RADIOLOGY:  CT Abdomen Pelvis Wo Contrast  Result Date: 06/06/2022 CLINICAL DATA:  Fever, nausea and vomiting. EXAM: CT ABDOMEN AND PELVIS WITHOUT CONTRAST TECHNIQUE: Multidetector CT imaging of the abdomen and pelvis was performed following the standard protocol without IV contrast. RADIATION DOSE REDUCTION: This exam was performed according to the departmental dose-optimization program which includes automated exposure control, adjustment of the mA and/or kV according to patient size and/or use of iterative reconstruction technique. COMPARISON:  None Available. FINDINGS: Lower chest: Bases are clear. The heart is slightly enlarged. The intraventricular blood pool is low in density consistent with anemia. There are three-vessel coronary artery calcifications. No pericardial effusion. Small hiatal hernia. Hepatobiliary: The liver is 21 cm in  length, not well seen due to streak artifact from the patient's right arm in the field but no gross abnormality is evident. There are vascular calcifications. Surgically absent gallbladder without biliary dilatation. Pancreas: Moderately atrophic and otherwise unremarkable. Spleen: Mildly prominent, 13.6 cm transverse. No focal abnormality without contrast. Adrenals/Urinary Tract: There is no adrenal mass. No focal abnormality in the unenhanced renal cortex. Bilateral intrarenal renovascular calcifications are noted but no collecting system stones. There is asymmetric left perinephric stranding and mild left hydroureteronephrosis to the bladder base but no ureteral or intravesical stone. There is no bladder thickening. Pelvic floor laxity with both ureters inserting low in the pelvis. Small cystocele extending below the level of the pubococcygeal line. 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 Small bowel anastomosis left mid abdomen. The appendix is normal.  Moderate fecal stasis. Scattered sigmoid diverticula without diverticulitis. Vascular/Lymphatic: Extensive vascular calcifications, with aortoiliac and visceral branch arterial diffuse atherosclerotic changes. Additional vascular calcifications involve the outer uterine wall. There are numerous pelvic phleboliths. Reproductive: The uterus is intact.  No adnexal masses seen. Other: Small umbilical and inguinal fat hernias. There is no free air, free hemorrhage or free fluid. Musculoskeletal: Osteopenia mild degenerative change lumbar spine. No acute or significant osseous findings. IMPRESSION: 1. Asymmetric left renal edema/stranding and mild hydroureteronephrosis without visible stone. The findings could be due to a recently passed stone or ascending UTI. 2. No bladder thickening or intravesical stone, with pelvic floor laxity and cystocele noted. 3. Mildly prominent liver and spleen. 4. Constipation and diverticulosis. 5. Extensive vascular calcifications. 6. Decreased density of the intraventricular blood in the heart consistent with anemia. Electronically Signed   By: Telford Nab M.D.   On: 06/06/2022 00:54   DG Chest Port 1 View  Result Date: 06/05/2022 CLINICAL DATA:  Vomiting and possible sepsis, initial encounter EXAM: PORTABLE CHEST 1 VIEW COMPARISON:  03/03/2021 FINDINGS: Cardiac shadow is enlarged but stable. The lungs are well aerated without focal infiltrate or effusion. No bony abnormality is noted. IMPRESSION: No acute abnormality noted. Electronically Signed   By: Inez Catalina M.D.   On: 06/05/2022 23:14      IMPRESSION AND PLAN:  Assessment and Plan: * Sepsis due to gram-negative UTI ([place]) - This could be secondary to an ascending UTI with left pyelonephritis. - Sepsis manifested by fever and leukocytosis as well as tachypnea. - She will be admitted to a medical telemetry bed. - We will continue antibiotic therapy with IV Rocephin. - We will follow blood and urine cultures. - She  will be hydrated with IV normal saline.  Hydroureteronephrosis -Though there is no clear evidence of stones, I would like to get a urology consultation. - I notified Dr. Abner Greenspan about the patient.  Acute kidney injury superimposed on chronic kidney disease ([place]) - This is mild acute kidney injury superimposed on stage IIIb chronic kidney disease. - She will be hydrated with IV normal saline. - We will hold off nephrotoxins. - We will follow BMP.  Type 2 diabetes mellitus with peripheral neuropathy (HCC) - The patient will be placed on supplement coverage with NovoLog. - We will hold off metformin. - We will continue Neurontin.  Essential hypertension - We will hold off Zestril temporarily. - BP is currently well controlled.  Dyslipidemia - We will continue statin therapy.  History of TIA (transient ischemic attack) - We will continue Plavix.       DVT prophylaxis: Lovenox.  Advanced Care Planning:  Code Status: full code.  Family Communication:  The plan of  care was discussed in details with the patient (and family). I answered all questions. The patient agreed to proceed with the above mentioned plan. Further management will depend upon hospital course. Disposition Plan: Back to previous home environment Consults called: Urology.   All the records are reviewed and case discussed with ED provider.  Status is: Inpatient    At the time of the admission, it appears that the appropriate admission status for this patient is inpatient.  This is judged to be reasonable and necessary in order to provide the required intensity of service to ensure the patient's safety given the presenting symptoms, physical exam findings and initial radiographic and laboratory data in the context of comorbid conditions.  The patient requires inpatient status due to high intensity of service, high risk of further deterioration and high frequency of surveillance required.  I certify that at the time of  admission, it is my clinical judgment that the patient will require inpatient hospital care extending more than 2 midnights.                            Dispo: The patient is from: Home              Anticipated d/c is to: Home              Patient currently is not medically stable to d/c.              Difficult to place patient: No  Christel Mormon M.D on 06/06/2022 at 6:54 AM  [facility]   From 7 PM-7 AM, contact night-coverage [email or web address]  CC: Primary care physician; Marguerita Merles, MD

## 2022-06-06 NOTE — Assessment & Plan Note (Signed)
-   secondary to an ascending UTI with left pyelonephritis. - Sepsis manifested by fever and leukocytosis as well as tachypnea. - Continue IV antibiotics.  Await urine culture

## 2022-06-06 NOTE — Consult Note (Signed)
Urology Consult   Physician requesting consult: Eugenie Norrie, MD  Reason for consult: Mild left hydronephrosis, left pyelonephritis  History of Present Illness: Elizabeth [name] is a 71 y.o. female with a past medical history of diabetes, hypertension, psoriasis, CVA and breast cancer who presented the ED with complaints of fever, chills, nausea and emesis.  She denies any abdominal pain or flank pain.  She was found to have a fever of 102 however otherwise normal vital signs.  Creatinine was found to be 1.56 from baseline of 1.1-1.7.  Her creatinine has since down trended to 1.41.  Urinalysis with positive nitrites, leukocyte Estrace and pyuria.  Urine culture and blood culture pending.  CT A/P 06/06/2022 with asymmetric left renal edema/stranding and mild left hydroureteronephrosis without evidence of stone.  She is resting comfortably on antibiotics.  She denies any abdominal pain or flank pain.  Her temperature curve has down trended.  She does admit to having some frequency and urgency.  She denies history of urolithiasis.  She denies passing stones or requiring surgical intervention for stones.  She denies prior urologic history.  Past Medical History:  Diagnosis Date   Cancer [facility])    breast   Carcinoma of right breast ([place]) 05/20/2014   Diabetes mellitus without complication ([place])    Hypertension    Lymphedema    Personal history of radiation therapy 2015   Right breast   PONV (postoperative nausea and vomiting)    Psoriasis 1990   Stroke ([place]) 2000   residual left arm weakness   Varicose veins     Past Surgical History:  Procedure Laterality Date   AMPUTATION Left 03/04/2021   Procedure: AMPUTATION RAY;  Surgeon: Sharlotte Alamo, DPM;  Location: [facility];  Service: Podiatry;  Laterality: Left;   AMPUTATION TOE Right 01/25/2017   Procedure: AMPUTATION TOE   ;  Surgeon: Albertine Patricia, DPM;  Location: [facility];  Service: Podiatry;  Laterality: Right;   BREAST EXCISIONAL BIOPSY Right  2015   [facility] / with radiation   BREAST LUMPECTOMY Right 04-2014   followed by radiation,  no chemo   BREAST REDUCTION SURGERY  1977   CHOLECYSTECTOMY N/A 09/23/2015   Procedure: LAPAROSCOPIC CHOLECYSTECTOMY;  Surgeon: Bonner Puna, MD;  Location: [facility];  Service: General;  Laterality: N/A;   DILATION AND CURETTAGE OF UTERUS  2010   EYE SURGERY Left 2015   cataracts and left eye detached retina repair   EYE SURGERY Right 2013   cataract with len implant   GASTRIC ROUX-EN-Y N/A 09/23/2015   Procedure: LAPAROSCOPIC ROUX-EN-Y GASTRIC;  Surgeon: Bonner Puna, MD;  Location: [facility];  Service: General;  Laterality: N/A;   HIATAL HERNIA REPAIR N/A 09/23/2015   Procedure: LAPAROSCOPIC REPAIR OF HIATAL HERNIA;  Surgeon: Bonner Puna, MD;  Location: [facility];  Service: General;  Laterality: N/A;   IRRIGATION AND DEBRIDEMENT FOOT Right 01/25/2017   Procedure: IRRIGATION AND DEBRIDEMENT FOOT;  Surgeon: Albertine Patricia, DPM;  Location: [facility];  Service: Podiatry;  Laterality: Right;   IRRIGATION AND DEBRIDEMENT FOOT Right 01/29/2017   Procedure: IRRIGATION AND DEBRIDEMENT FOOT and application of wound vac;  Surgeon: Albertine Patricia, DPM;  Location: [facility];  Service: Podiatry;  Laterality: Right;   LOWER EXTREMITY ANGIOGRAPHY Right 01/28/2017   Procedure: Lower Extremity Angiography;  Surgeon: Katha Cabal, MD;  Location: [place] [facility];  Service: Cardiovascular;  Laterality: Right;   LOWER EXTREMITY ANGIOGRAPHY Left 03/06/2021   Procedure: Lower Extremity Angiography;  Surgeon:  Schnier, Dolores Lory, MD;  Location: [place] [facility];  Service: Cardiovascular;  Laterality: Left;   LOWER EXTREMITY INTERVENTION  01/28/2017   Procedure: Lower Extremity Intervention;  Surgeon: Katha Cabal, MD;  Location: [place] [facility];  Service: Cardiovascular;;   REDUCTION MAMMAPLASTY       Current Hospital Medications:  Home meds:  No current facility-administered medications on file prior to  encounter.   Current Outpatient Medications on File Prior to Encounter  Medication Sig Dispense Refill   acetaminophen (TYLENOL) 325 MG tablet Take 2 tablets (650 mg total) by mouth every 6 (six) hours as needed for mild pain (or Fever >/= 101).     albuterol (PROVENTIL HFA;VENTOLIN HFA) 108 (90 BASE) MCG/ACT inhaler Inhale 2 puffs into the lungs every 6 (six) hours as needed for wheezing or shortness of breath. (Patient not taking: Reported on 05/17/2022)     Calcium Carb-Cholecalciferol (CALCIUM 1000 + D PO) Take 1 tablet by mouth daily. (Patient not taking: Reported on 05/17/2022)     clopidogrel (PLAVIX) 75 MG tablet Take 75 mg by mouth daily.     furosemide (LASIX) 40 MG tablet Take 0.5 tablets (20 mg total) by mouth daily. 30 tablet 3   gabapentin (NEURONTIN) 300 MG capsule Take 300 mg by mouth 3 (three) times daily.  (Patient not taking: Reported on 05/17/2022)     linaclotide (LINZESS) 145 MCG CAPS capsule      linagliptin (TRADJENTA) 5 MG TABS tablet Take 5 mg by mouth daily.     lisinopril (ZESTRIL) 10 MG tablet Take 1 tablet (10 mg total) by mouth daily. 30 tablet 3   metFORMIN (GLUCOPHAGE) 1000 MG tablet Take 1,000 mg by mouth daily with breakfast.      PARoxetine (PAXIL) 40 MG tablet Take 40 mg by mouth daily. (Patient not taking: Reported on 05/17/2022)     potassium chloride (MICRO-K) 10 MEQ CR capsule      pravastatin (PRAVACHOL) 40 MG tablet Take 40 mg by mouth daily. In afternoon     triamcinolone ointment (KENALOG) 0.5 % Apply 1 application topically 2 (two) times daily. (Patient not taking: Reported on 05/17/2022) 30 g 3   TRUE METRIX BLOOD GLUCOSE TEST test strip      TRUEPLUS LANCETS 33G MISC        Scheduled Meds:  atropine  0.5 mg Intravenous Once   clopidogrel  75 mg Oral Daily   enoxaparin (LOVENOX) injection  40 mg Subcutaneous Q24H   escitalopram  10 mg Oral QHS   linaclotide  145 mcg Oral QAC breakfast   linagliptin  5 mg Oral Daily   potassium chloride  20 mEq  Oral Daily   pravastatin  40 mg Oral Daily   Continuous Infusions:  sodium chloride     sodium chloride     [START ON 06/07/2022] cefTRIAXone (ROCEPHIN)  IV     PRN Meds:.acetaminophen **OR** acetaminophen, magnesium hydroxide, ondansetron **OR** ondansetron (ZOFRAN) IV, traZODone  Allergies: No Known Allergies  Family History  Problem Relation Age of Onset   Diabetes Mother    Cancer Mother    Breast cancer Mother 76   Diabetes Father    Diabetes Sister    Diabetes Brother     Social History:  reports that she has never smoked. She has never used smokeless tobacco. She reports that she does not drink alcohol and does not use drugs.  ROS: A complete review of systems was performed.  All systems are negative except for  pertinent findings as noted.  Physical Exam:  Vital signs in last 24 hours: Temp:  [97.3 F (36.3 C)-102 F (38.9 C)] 97.4 F (36.3 C) (07/30 0509) Pulse Rate:  [59-75] 59 (07/30 0509) Resp:  [16-28] 16 (07/30 0509) BP: (96-129)/(52-79) 110/63 (07/30 0509) SpO2:  [94 %-99 %] 98 % (07/30 0509) Weight:  [92.2 kg-93 kg] 92.2 kg (07/30 0259) Constitutional:  Alert and oriented, No acute distress Cardiovascular: Regular rate and rhythm Respiratory: Normal respiratory effort, Lungs clear bilaterally GI: Abdomen is soft, nontender, nondistended, no abdominal masses GU: No CVA tenderness Neurologic: Grossly intact, no focal deficits Psychiatric: Normal mood and affect  Laboratory Data:  Recent Labs    06/05/22 2232 06/06/22 0325  WBC 12.9* 11.0*  HGB 9.8* 8.2*  HCT 31.5* 25.9*  PLT 305 260    Recent Labs    06/05/22 2232 06/06/22 0325  NA 136 136  K 4.2 3.9  CL 105 107  GLUCOSE 190* 219*  BUN 24* 22  CALCIUM 8.2* 7.9*  CREATININE 1.56* 1.41*     Results for orders placed or performed during the hospital encounter of 06/05/22 (from the past 24 hour(s))  Comprehensive metabolic panel     Status: Abnormal   Collection Time: 06/05/22 10:32 PM   Result Value Ref Range   Sodium 136 135 - 145 mmol/L   Potassium 4.2 3.5 - 5.1 mmol/L   Chloride 105 98 - 111 mmol/L   CO2 23 22 - 32 mmol/L   Glucose, Bld 190 (H) 70 - 99 mg/dL   BUN 24 (H) 8 - 23 mg/dL   Creatinine, Ser 1.56 (H) 0.44 - 1.00 mg/dL   Calcium 8.2 (L) 8.9 - 10.3 mg/dL   Total Protein 6.9 6.5 - 8.1 g/dL   Albumin 2.8 (L) 3.5 - 5.0 g/dL   AST 13 (L) 15 - 41 U/L   ALT 9 0 - 44 U/L   Alkaline Phosphatase 77 38 - 126 U/L   Total Bilirubin 0.7 0.3 - 1.2 mg/dL   GFR, Estimated 36 (L) >60 mL/min   Anion gap 8 5 - 15  Lactic acid, plasma     Status: None   Collection Time: 06/05/22 10:32 PM  Result Value Ref Range   Lactic Acid, Venous 1.3 0.5 - 1.9 mmol/L  CBC with Differential     Status: Abnormal   Collection Time: 06/05/22 10:32 PM  Result Value Ref Range   WBC 12.9 (H) 4.0 - 10.5 K/uL   RBC 3.88 3.87 - 5.11 MIL/uL   Hemoglobin 9.8 (L) 12.0 - 15.0 g/dL   HCT 31.5 (L) 36.0 - 46.0 %   MCV 81.2 80.0 - 100.0 fL   MCH 25.3 (L) 26.0 - 34.0 pg   MCHC 31.1 30.0 - 36.0 g/dL   RDW 16.5 (H) 11.5 - 15.5 %   Platelets 305 150 - 400 K/uL   nRBC 0.0 0.0 - 0.2 %   Neutrophils Relative % 88 %   Neutro Abs 11.2 (H) 1.7 - 7.7 K/uL   Lymphocytes Relative 4 %   Lymphs Abs 0.5 (L) 0.7 - 4.0 K/uL   Monocytes Relative 8 %   Monocytes Absolute 1.1 (H) 0.1 - 1.0 K/uL   Eosinophils Relative 0 %   Eosinophils Absolute 0.0 0.0 - 0.5 K/uL   Basophils Relative 0 %   Basophils Absolute 0.0 0.0 - 0.1 K/uL   Immature Granulocytes 0 %   Abs Immature Granulocytes 0.05 0.00 - 0.07 K/uL  Protime-INR  Status: None   Collection Time: 06/05/22 10:32 PM  Result Value Ref Range   Prothrombin Time 14.1 11.4 - 15.2 seconds   INR 1.1 0.8 - 1.2  Culture, blood (Routine x 2)     Status: None (Preliminary result)   Collection Time: 06/05/22 10:32 PM   Specimen: Right Antecubital; Blood  Result Value Ref Range   Specimen Description RIGHT ANTECUBITAL    Special Requests      BOTTLES DRAWN  AEROBIC AND ANAEROBIC Blood Culture adequate volume   Culture      NO GROWTH < 12 HOURS Performed at [facility], [address] [place], [place] [address]    Report Status PENDING   Urinalysis, Routine w reflex microscopic     Status: Abnormal   Collection Time: 06/05/22 10:32 PM  Result Value Ref Range   Color, Urine YELLOW (A) YELLOW   APPearance CLOUDY (A) CLEAR   Specific Gravity, Urine 1.011 1.005 - 1.030   pH 5.0 5.0 - 8.0   Glucose, UA NEGATIVE NEGATIVE mg/dL   Hgb urine dipstick SMALL (A) NEGATIVE   Bilirubin Urine NEGATIVE NEGATIVE   Ketones, ur NEGATIVE NEGATIVE mg/dL   Protein, ur 30 (A) NEGATIVE mg/dL   Nitrite POSITIVE (A) NEGATIVE   Leukocytes,Ua LARGE (A) NEGATIVE   RBC / HPF 0-5 0 - 5 RBC/hpf   WBC, UA >50 (H) 0 - 5 WBC/hpf   Bacteria, UA MANY (A) NONE SEEN   Squamous Epithelial / LPF 11-20 0 - 5   WBC Clumps PRESENT    Mucus PRESENT   Procalcitonin - Baseline     Status: None   Collection Time: 06/05/22 10:32 PM  Result Value Ref Range   Procalcitonin 1.23 ng/mL  Lipase, blood     Status: None   Collection Time: 06/05/22 10:32 PM  Result Value Ref Range   Lipase 27 11 - 51 U/L  Troponin I (High Sensitivity)     Status: None   Collection Time: 06/05/22 10:32 PM  Result Value Ref Range   Troponin I (High Sensitivity) 10 <18 ng/L  Culture, blood (Routine x 2)     Status: None (Preliminary result)   Collection Time: 06/05/22 11:04 PM   Specimen: BLOOD RIGHT FOREARM  Result Value Ref Range   Specimen Description BLOOD RIGHT FOREARM    Special Requests      BOTTLES DRAWN AEROBIC AND ANAEROBIC Blood Culture adequate volume   Culture      NO GROWTH < 12 HOURS Performed at [facility], [address], [place], [place] [address]    Report Status PENDING   SARS Coronavirus 2 by RT PCR (hospital order, performed in [place] hospital lab) *cepheid single result test* Anterior Nasal Swab     Status: None   Collection Time: 06/05/22  11:04 PM   Specimen: Anterior Nasal Swab  Result Value Ref Range   SARS Coronavirus 2 by RT PCR NEGATIVE NEGATIVE  Protime-INR     Status: None   Collection Time: 06/06/22  3:25 AM  Result Value Ref Range   Prothrombin Time 14.7 11.4 - 15.2 seconds   INR 1.2 0.8 - 1.2  Procalcitonin     Status: None   Collection Time: 06/06/22  3:25 AM  Result Value Ref Range   Procalcitonin 1.32 ng/mL  Basic metabolic panel     Status: Abnormal   Collection Time: 06/06/22  3:25 AM  Result Value Ref Range   Sodium 136 135 - 145 mmol/L  Potassium 3.9 3.5 - 5.1 mmol/L   Chloride 107 98 - 111 mmol/L   CO2 23 22 - 32 mmol/L   Glucose, Bld 219 (H) 70 - 99 mg/dL   BUN 22 8 - 23 mg/dL   Creatinine, Ser 1.41 (H) 0.44 - 1.00 mg/dL   Calcium 7.9 (L) 8.9 - 10.3 mg/dL   GFR, Estimated 40 (L) >60 mL/min   Anion gap 6 5 - 15  CBC     Status: Abnormal   Collection Time: 06/06/22  3:25 AM  Result Value Ref Range   WBC 11.0 (H) 4.0 - 10.5 K/uL   RBC 3.16 (L) 3.87 - 5.11 MIL/uL   Hemoglobin 8.2 (L) 12.0 - 15.0 g/dL   HCT 25.9 (L) 36.0 - 46.0 %   MCV 82.0 80.0 - 100.0 fL   MCH 25.9 (L) 26.0 - 34.0 pg   MCHC 31.7 30.0 - 36.0 g/dL   RDW 16.4 (H) 11.5 - 15.5 %   Platelets 260 150 - 400 K/uL   nRBC 0.0 0.0 - 0.2 %   Recent Results (from the past 240 hour(s))  Culture, blood (Routine x 2)     Status: None (Preliminary result)   Collection Time: 06/05/22 10:32 PM   Specimen: Right Antecubital; Blood  Result Value Ref Range Status   Specimen Description RIGHT ANTECUBITAL  Final   Special Requests   Final    BOTTLES DRAWN AEROBIC AND ANAEROBIC Blood Culture adequate volume   Culture   Final    NO GROWTH < 12 HOURS Performed at [facility], [address] [place], [place] [address]    Report Status PENDING  Incomplete  Culture, blood (Routine x 2)     Status: None (Preliminary result)   Collection Time: 06/05/22 11:04 PM   Specimen: BLOOD RIGHT FOREARM  Result Value Ref Range Status    Specimen Description BLOOD RIGHT FOREARM  Final   Special Requests   Final    BOTTLES DRAWN AEROBIC AND ANAEROBIC Blood Culture adequate volume   Culture   Final    NO GROWTH < 12 HOURS Performed at [facility], [address] [place], [place] [address]    Report Status PENDING  Incomplete  SARS Coronavirus 2 by RT PCR (hospital order, performed in [place] hospital lab) *cepheid single result test* Anterior Nasal Swab     Status: None   Collection Time: 06/05/22 11:04 PM   Specimen: Anterior Nasal Swab  Result Value Ref Range Status   SARS Coronavirus 2 by RT PCR NEGATIVE NEGATIVE Final    Comment: (NOTE) SARS-CoV-2 target nucleic acids are NOT DETECTED.  The SARS-CoV-2 RNA is generally detectable in upper and lower respiratory specimens during the acute phase of infection. The lowest concentration of SARS-CoV-2 viral copies this assay can detect is 250 copies / mL. A negative result does not preclude SARS-CoV-2 infection and should not be used as the sole basis for treatment or other patient management decisions.  A negative result may occur with improper specimen collection / handling, submission of specimen other than nasopharyngeal swab, presence of viral mutation(s) within the areas targeted by this assay, and inadequate number of viral copies (<250 copies / mL). A negative result must be combined with clinical observations, patient history, and epidemiological information.  Fact Sheet for Patients:   https://www.patel.info/  Fact Sheet for Healthcare Providers: https://hall.com/  This test is not yet approved or  cleared by the [place] FDA and has been authorized for detection  and/or diagnosis of SARS-CoV-2 by FDA under an Emergency Use Authorization (EUA).  This EUA will remain in effect (meaning this test can be used) for the duration of the COVID-19 declaration under Section 564(b)(1) of the Act, 21  U.S.C. section 360bbb-3(b)(1), unless the authorization is terminated or revoked sooner.  Performed at [facility], [address] [place], [place] [address]     Renal Function: Recent Labs    06/05/22 2232 06/06/22 0325  CREATININE 1.56* 1.41*   Estimated Creatinine Clearance: 40 mL/min (A) (by C-G formula based on SCr of 1.41 mg/dL (H)).  Radiologic Imaging: CT Abdomen Pelvis Wo Contrast  Result Date: 06/06/2022 CLINICAL DATA:  Fever, nausea and vomiting. EXAM: CT ABDOMEN AND PELVIS WITHOUT CONTRAST TECHNIQUE: Multidetector CT imaging of the abdomen and pelvis was performed following the standard protocol without IV contrast. RADIATION DOSE REDUCTION: This exam was performed according to the departmental dose-optimization program which includes automated exposure control, adjustment of the mA and/or kV according to patient size and/or use of iterative reconstruction technique. COMPARISON:  None Available. FINDINGS: Lower chest: Bases are clear. The heart is slightly enlarged. The intraventricular blood pool is low in density consistent with anemia. There are three-vessel coronary artery calcifications. No pericardial effusion. Small hiatal hernia. Hepatobiliary: The liver is 21 cm in length, not well seen due to streak artifact from the patient's right arm in the field but no gross abnormality is evident. There are vascular calcifications. Surgically absent gallbladder without biliary dilatation. Pancreas: Moderately atrophic and otherwise unremarkable. Spleen: Mildly prominent, 13.6 cm transverse. No focal abnormality without contrast. Adrenals/Urinary Tract: There is no adrenal mass. No focal abnormality in the unenhanced renal cortex. Bilateral intrarenal renovascular calcifications are noted but no collecting system stones. There is asymmetric left perinephric stranding and mild left hydroureteronephrosis to the bladder base but no ureteral or intravesical stone. There is no  bladder thickening. Pelvic floor laxity with both ureters inserting low in the pelvis. Small cystocele extending below the level of the pubococcygeal line. 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 Small bowel anastomosis left mid abdomen. The appendix is normal. Moderate fecal stasis. Scattered sigmoid diverticula without diverticulitis. Vascular/Lymphatic: Extensive vascular calcifications, with aortoiliac and visceral branch arterial diffuse atherosclerotic changes. Additional vascular calcifications involve the outer uterine wall. There are numerous pelvic phleboliths. Reproductive: The uterus is intact.  No adnexal masses seen. Other: Small umbilical and inguinal fat hernias. There is no free air, free hemorrhage or free fluid. Musculoskeletal: Osteopenia mild degenerative change lumbar spine. No acute or significant osseous findings. IMPRESSION: 1. Asymmetric left renal edema/stranding and mild hydroureteronephrosis without visible stone. The findings could be due to a recently passed stone or ascending UTI. 2. No bladder thickening or intravesical stone, with pelvic floor laxity and cystocele noted. 3. Mildly prominent liver and spleen. 4. Constipation and diverticulosis. 5. Extensive vascular calcifications. 6. Decreased density of the intraventricular blood in the heart consistent with anemia. Electronically Signed   By: Telford Nab M.D.   On: 06/06/2022 00:54   DG Chest Port 1 View  Result Date: 06/05/2022 CLINICAL DATA:  Vomiting and possible sepsis, initial encounter EXAM: PORTABLE CHEST 1 VIEW COMPARISON:  03/03/2021 FINDINGS: Cardiac shadow is enlarged but stable. The lungs are well aerated without focal infiltrate or effusion. No bony abnormality is noted. IMPRESSION: No acute abnormality noted. Electronically Signed   By: Inez Catalina M.D.   On: 06/05/2022 23:14    I independently reviewed the above imaging studies.  Impression/Recommendation: Right pyelonephritis Mild right hydronephrosis  AKI  on CKD IIIb  -I reviewed CT A/P 06/06/2022 with asymmetric left renal edema/stranding and mild left hydroureteronephrosis without evidence of stone.  She denies abdominal pain or flank pain and has no history of urolithiasis. -Her creatinine has down trended from initial evaluation with IV fluids resuscitation. -I recommend broad-spectrum antibiotics and plan for 2-week total therapy given complicated UTI. -No indication for urgent stent placement given mild hydronephrosis, absence of flank pain and no evidence of obstruction.   Matt R. Chizuko Trine MD 06/06/2022, 6:59 AM  [facility]  Pager: [phone]   CC: Eugenie Norrie, MD

## 2022-06-06 NOTE — Assessment & Plan Note (Signed)
-   continue statin therapy. 

## 2022-06-06 NOTE — Assessment & Plan Note (Signed)
-   This is mild acute kidney injury superimposed on stage IIIb chronic kidney disease. - She will be hydrated with IV normal saline. - We will hold off nephrotoxins. - We will follow BMP.

## 2022-06-06 NOTE — Assessment & Plan Note (Signed)
-  continue Plavix °

## 2022-06-06 NOTE — Assessment & Plan Note (Signed)
-   Well-controlled for now

## 2022-06-06 NOTE — Final Progress Note (Signed)
Same day rounding progress note  Patient seen and examined.  Agree with assessment and plan listed in H&P.  Please see Dr. Dalene Seltzer dictated history and physical for further details.  Right pyelonephritis/mild right hydronephrosis Patient seen by urology and recommend conservative management with IV antibiotics for now  AKI on CKD stage IIIb Continue IV hydration and monitor kidney function  Time spent 15 minutes

## 2022-06-06 NOTE — Code Documentation (Signed)
CODE SEPSIS - PHARMACY COMMUNICATION  **Broad Spectrum Antibiotics should be administered within 1 hour of Sepsis diagnosis**  Time Code Sepsis Called/Page Received: 7/30 0101  Antibiotics Ordered: ceftriaxone  Time of 1st antibiotic administration: 7/29 [place] ,PharmD Clinical Pharmacist  06/06/2022  1:22 AM

## 2022-06-07 DIAGNOSIS — N1330 Unspecified hydronephrosis: Secondary | ICD-10-CM | POA: Diagnosis not present

## 2022-06-07 DIAGNOSIS — N1832 Chronic kidney disease, stage 3b: Secondary | ICD-10-CM

## 2022-06-07 DIAGNOSIS — N179 Acute kidney failure, unspecified: Secondary | ICD-10-CM | POA: Diagnosis not present

## 2022-06-07 DIAGNOSIS — A4150 Gram-negative sepsis, unspecified: Secondary | ICD-10-CM | POA: Diagnosis not present

## 2022-06-07 DIAGNOSIS — N390 Urinary tract infection, site not specified: Secondary | ICD-10-CM | POA: Diagnosis not present

## 2022-06-07 LAB — BASIC METABOLIC PANEL
Anion gap: 7 (ref 5–15)
BUN: 17 mg/dL (ref 8–23)
CO2: 21 mmol/L — ABNORMAL LOW (ref 22–32)
Calcium: 8 mg/dL — ABNORMAL LOW (ref 8.9–10.3)
Chloride: 111 mmol/L (ref 98–111)
Creatinine, Ser: 1.23 mg/dL — ABNORMAL HIGH (ref 0.44–1.00)
GFR, Estimated: 47 mL/min — ABNORMAL LOW (ref >60)
Glucose, Bld: 109 mg/dL — ABNORMAL HIGH (ref 70–99)
Potassium: 4.1 mmol/L (ref 3.5–5.1)
Sodium: 139 mmol/L (ref 135–145)

## 2022-06-07 LAB — CBC
HCT: 25.5 % — ABNORMAL LOW (ref 36.0–46.0)
Hemoglobin: 7.9 g/dL — ABNORMAL LOW (ref 12.0–15.0)
MCH: 25.4 pg — ABNORMAL LOW (ref 26.0–34.0)
MCHC: 31 g/dL (ref 30.0–36.0)
MCV: 82 fL (ref 80.0–100.0)
Platelets: 265 10*3/uL (ref 150–400)
RBC: 3.11 MIL/uL — ABNORMAL LOW (ref 3.87–5.11)
RDW: 16.9 % — ABNORMAL HIGH (ref 11.5–15.5)
WBC: 8.1 10*3/uL (ref 4.0–10.5)
nRBC: 0 % (ref 0.0–0.2)

## 2022-06-07 LAB — GLUCOSE, CAPILLARY
Glucose-Capillary: 173 mg/dL — ABNORMAL HIGH (ref 70–99)
Glucose-Capillary: 111 mg/dL — ABNORMAL HIGH (ref 70–99)
Glucose-Capillary: 85 mg/dL (ref 70–99)

## 2022-06-07 LAB — HEMOGLOBIN A1C
Hgb A1c MFr Bld: 6.4 % — ABNORMAL HIGH (ref 4.8–5.6)
Mean Plasma Glucose: 136.98 mg/dL

## 2022-06-07 MED ORDER — INSULIN ASPART 100 UNIT/ML IJ SOLN
0.0000 [IU] | Freq: Three times a day (TID) | INTRAMUSCULAR | Status: DC
  Administered 2022-06-07: 2 [IU] via SUBCUTANEOUS
  Administered 2022-06-08: 5 [IU] via SUBCUTANEOUS
  Administered 2022-06-09: 3 [IU] via SUBCUTANEOUS
  Filled 2022-06-07 (×3): qty 1

## 2022-06-07 MED ORDER — INSULIN ASPART 100 UNIT/ML IJ SOLN
0.0000 [IU] | Freq: Every day | INTRAMUSCULAR | Status: DC
  Administered 2022-06-08: 2 [IU] via SUBCUTANEOUS
  Filled 2022-06-07: qty 1

## 2022-06-07 NOTE — Progress Notes (Signed)
Mobility Specialist - Progress Note      06/07/22 1104  Mobility  Activity Ambulated with assistance in room;Ambulated with assistance to bathroom  Level of Assistance Standby assist, set-up cues, supervision of patient - no hands on  Assistive Device Four wheel walker  Distance Ambulated (ft) 40 ft  Activity Response Tolerated well  $Mobility charge 1 Mobility   Pt supine upon entry. Pt utilizing RA. Pt ambulated from bed to door, and bathroom with min assist. Pt voiced concerns of nausea, nurses was informed. Pt left in chair with needs in reach. RN entry upon exiting.   Candie Mile Mobility Specialist 06/07/22 11:07 AM

## 2022-06-07 NOTE — Hospital Course (Signed)
71 y.o. Caucasian female with medical history significant for type diabetes mellitus, hypertension, psoriasis, CVA and breast cancer admitted for right pyelonephritis, AKI and mild right hydronephrosis  7/30: Urology recommends conservative management with IV antibiotics 7/31: AKI improving with IV hydration.  On IV antibiotics 8/1: Cough, wheezing and shortness of breath.

## 2022-06-07 NOTE — Progress Notes (Signed)
  Progress Note   Patient: Elizabeth [name] [id number]:[id number] DOB: 1950/12/10 DOA: 06/05/2022     1 DOS: the patient was seen and examined on 06/07/2022   Brief hospital course: 71 y.o. Caucasian female with medical history significant for type diabetes mellitus, hypertension, psoriasis, CVA and breast cancer admitted for right pyelonephritis, AKI and mild right hydronephrosis  7/30: Urology recommends conservative management with IV antibiotics 7/31: AKI improving with IV hydration.  On IV antibiotics   Assessment and Plan: * Sepsis due to gram-negative UTI ([place]) - secondary to an ascending UTI with left pyelonephritis. - Sepsis manifested by fever and leukocytosis as well as tachypnea. - Continue IV antibiotics.  Await urine culture  Hydroureteronephrosis -Urology recommends broad-spectrum antibiotic and total 2 weeks of total course of antibiotics Outpatient urology follow-up.  No procedure planned while in the hospital    Acute renal failure superimposed on stage 3b chronic kidney disease ([place]) - Improved with hydration  Type 2 diabetes mellitus with peripheral neuropathy (HCC) - Continue supplement coverage with NovoLog. - hold off metformin. - continue Neurontin.  Essential hypertension - Well-controlled for now  Lower urinary tract infectious disease Continue IV Rocephin.  Await urine culture.  Leukocytosis improving  Dyslipidemia - continue statin therapy.  History of TIA (transient ischemic attack) - continue Plavix.        Subjective: Ambulated some.  Feeling weak  Physical Exam: Vitals:   06/06/22 1532 06/06/22 2013 06/07/22 0401 06/07/22 0740  BP: 124/60 115/79 (!) 125/58 (!) 146/69  Pulse: (!) 59 (!) 56 65 62  Resp: '20 16 20 17  '$ Temp: 98.2 F (36.8 C) 99.1 F (37.3 C) 97.9 F (36.6 C) 98 F (36.7 C)  TempSrc: Oral Oral Oral Oral  SpO2: 99% 96% 99% 99%  Weight:      Height:       71 year old female sitting in the chair without any acute  distress Lungs clear to auscultation bilaterally Cardiovascular regular rate and rhythm Abdomen soft, benign Neuro alert and oriented, nonfocal Psych normal mood and affect Skin no rash or lesion Data Reviewed:  creat 1.23, hb 7.9  Family Communication: none  Disposition: Status is: Inpatient Remains inpatient appropriate because: IV Abx   Planned Discharge Destination: Home with Home Health   DVT Prophylaxis - lovenox Time spent: 35 minutes  Author: Max Sane, MD 06/07/2022 2:30 PM  For on call review [email or web address].

## 2022-06-07 NOTE — Assessment & Plan Note (Signed)
Continue IV Rocephin.  Await urine culture.  Leukocytosis improving

## 2022-06-08 DIAGNOSIS — N1330 Unspecified hydronephrosis: Secondary | ICD-10-CM | POA: Diagnosis not present

## 2022-06-08 DIAGNOSIS — N179 Acute kidney failure, unspecified: Secondary | ICD-10-CM | POA: Diagnosis not present

## 2022-06-08 DIAGNOSIS — A4150 Gram-negative sepsis, unspecified: Secondary | ICD-10-CM | POA: Diagnosis not present

## 2022-06-08 DIAGNOSIS — N390 Urinary tract infection, site not specified: Secondary | ICD-10-CM | POA: Diagnosis not present

## 2022-06-08 LAB — BASIC METABOLIC PANEL
Anion gap: 6 (ref 5–15)
BUN: 17 mg/dL (ref 8–23)
CO2: 21 mmol/L — ABNORMAL LOW (ref 22–32)
Calcium: 8 mg/dL — ABNORMAL LOW (ref 8.9–10.3)
Chloride: 113 mmol/L — ABNORMAL HIGH (ref 98–111)
Creatinine, Ser: 1.11 mg/dL — ABNORMAL HIGH (ref 0.44–1.00)
GFR, Estimated: 53 mL/min — ABNORMAL LOW (ref >60)
Glucose, Bld: 112 mg/dL — ABNORMAL HIGH (ref 70–99)
Potassium: 4.1 mmol/L (ref 3.5–5.1)
Sodium: 140 mmol/L (ref 135–145)

## 2022-06-08 LAB — CBC
HCT: 26.8 % — ABNORMAL LOW (ref 36.0–46.0)
Hemoglobin: 8.3 g/dL — ABNORMAL LOW (ref 12.0–15.0)
MCH: 25.5 pg — ABNORMAL LOW (ref 26.0–34.0)
MCHC: 31 g/dL (ref 30.0–36.0)
MCV: 82.2 fL (ref 80.0–100.0)
Platelets: 297 10*3/uL (ref 150–400)
RBC: 3.26 MIL/uL — ABNORMAL LOW (ref 3.87–5.11)
RDW: 16.8 % — ABNORMAL HIGH (ref 11.5–15.5)
WBC: 8.1 10*3/uL (ref 4.0–10.5)
nRBC: 0 % (ref 0.0–0.2)

## 2022-06-08 LAB — GLUCOSE, CAPILLARY
Glucose-Capillary: 104 mg/dL — ABNORMAL HIGH (ref 70–99)
Glucose-Capillary: 112 mg/dL — ABNORMAL HIGH (ref 70–99)
Glucose-Capillary: 254 mg/dL — ABNORMAL HIGH (ref 70–99)
Glucose-Capillary: 247 mg/dL — ABNORMAL HIGH (ref 70–99)

## 2022-06-08 MED ORDER — ENOXAPARIN SODIUM 60 MG/0.6ML IJ SOSY
0.5000 mg/kg | PREFILLED_SYRINGE | INTRAMUSCULAR | Status: DC
  Administered 2022-06-09: 45 mg via SUBCUTANEOUS
  Filled 2022-06-08: qty 0.6

## 2022-06-08 MED ORDER — METHYLPREDNISOLONE SODIUM SUCC 40 MG IJ SOLR
40.0000 mg | Freq: Two times a day (BID) | INTRAMUSCULAR | Status: DC
  Administered 2022-06-08 – 2022-06-09 (×3): 40 mg via INTRAVENOUS
  Filled 2022-06-08 (×3): qty 1

## 2022-06-08 MED ORDER — FUROSEMIDE 20 MG PO TABS
20.0000 mg | ORAL_TABLET | Freq: Every day | ORAL | Status: DC
  Administered 2022-06-08 – 2022-06-09 (×2): 20 mg via ORAL
  Filled 2022-06-08 (×2): qty 1

## 2022-06-08 NOTE — Assessment & Plan Note (Signed)
-   Well-controlled for now.

## 2022-06-08 NOTE — Progress Notes (Signed)
  Progress Note   Patient: Elizabeth [name] TZG:[id number] DOB: [birth year] DOA: 06/05/2022     2 DOS: the patient was seen and examined on 06/08/2022   Brief hospital course: 71 y.o. Caucasian female with medical history significant for type diabetes mellitus, hypertension, psoriasis, CVA and breast cancer admitted for right pyelonephritis, AKI and mild right hydronephrosis  7/30: Urology recommends conservative management with IV antibiotics 7/31: AKI improving with IV hydration.  On IV antibiotics 8/1: Cough, wheezing and shortness of breath.   Assessment and Plan: * Sepsis due to gram-negative UTI ([place]) - secondary to an ascending UTI with left pyelonephritis.  No urine culture sent so I have ordered today - Sepsis manifested by fever and leukocytosis as well as tachypnea. - Continue IV antibiotics.  Await urine culture  Hydroureteronephrosis Urology recommends continuing antibiotics for total 2 weeks  Outpatient urology follow-up.  No procedure planned while in the hospital    Acute renal failure superimposed on stage 3b chronic kidney disease ([place]) Improving with hydration  Lab Results  Component Value Date   CREATININE 1.11 (H) 06/08/2022   CREATININE 1.23 (H) 06/07/2022   CREATININE 1.41 (H) 06/06/2022    Type 2 diabetes mellitus with peripheral neuropathy (HCC) - Continue supplement coverage with NovoLog. - hold off metformin. - Blood sugar may creep up as steroids started today  Essential hypertension - Well-controlled for now.  Lower urinary tract infectious disease Continue IV Rocephin.  Await urine culture.  Remains afebrile.  Dyslipidemia - continue statin therapy  History of TIA (transient ischemic attack) - continue Plavix        Subjective: Coughing, wheezing, short of breath.  Sitting in the chair  Physical Exam: Vitals:   06/07/22 1629 06/07/22 1945 06/08/22 0503 06/08/22 0751  BP: (!) 151/60 (!) 157/75 (!) 148/67 (!) 149/72  Pulse: (!) 55  65 (!) 55 63  Resp: '18 18 20 16  '$ Temp: 97.6 F (36.4 C) 97.8 F (36.6 C) 98 F (36.7 C) 98.3 F (36.8 C)  TempSrc: Oral Oral Oral   SpO2: 100% 100% 100% 99%  Weight:      Height:       71 year old female sitting in the chair without any acute distress Lungs mild expiratory wheezing bilaterally Cardiovascular regular rate and rhythm Abdomen soft, benign Neuro alert and oriented, nonfocal Psych normal mood and affect Skin no rash or lesion Data Reviewed:  Hemoglobin 8.3  Family Communication: None  Disposition: Status is: Inpatient Remains inpatient appropriate because: Coughing and wheezing today added steroids   Planned Discharge Destination: Home    DVT prophylaxis-Lovenox Time spent: 35 minutes  Author: Max Sane, MD 06/08/2022 1:44 PM  For on call review [email or web address].

## 2022-06-08 NOTE — TOC Progression Note (Signed)
Transition of Care [facility]) - Progression Note    Patient Details  Name: THERESA WEDEL MRN: [id number] Date of Birth: 1951/09/04  [facility]) CM/SW Contact  Beverly Sessions, RN Phone Number: 06/08/2022, 12:01 PM  Clinical Narrative:       Transition of Care [facility]) Screening Note   Patient Details  Name: HULDAH MARIN Date of Birth: 1951-09-03   Transition of Care [facility]) CM/SW Contact:    Beverly Sessions, RN Phone Number: 06/08/2022, 12:01 PM    Transition of Care Department [facility]) has reviewed patient and no TOC needs have been identified at this time. We will continue to monitor patient advancement through interdisciplinary progression rounds. If new patient transition needs arise, please place a TOC consult.        Expected Discharge Plan and Services                                                 Social Determinants of Health (SDOH) Interventions    Readmission Risk Interventions     No data to display

## 2022-06-08 NOTE — Assessment & Plan Note (Signed)
Urology recommends continuing antibiotics for total 2 weeks  Outpatient urology follow-up.  No procedure planned while in the hospital

## 2022-06-08 NOTE — Progress Notes (Signed)
       CROSS COVER NOTE  NAME: DONISE WOODLE MRN: [id number] DOB : Jan 07, 1951    Date of Service   06/08/2022  HPI/Events of Note   Notified of elevated BP 186/72, patient is asymptomatic at this time.   Interventions   Plan: Hypertension  Recheck BP in 4 hrs--> 192/80 Restart home Lisinopril 10 mg PO daily   AKI on CKD-3b Monitor Creatinine - most recent 1.11 down from 1.56 on admission    This document was prepared using Dragon voice recognition software and may include unintentional dictation errors.  Neomia Glass DNP, MHA, FNP-BC Nurse Practitioner [facility] Pager [phone]

## 2022-06-08 NOTE — Assessment & Plan Note (Signed)
-   continue statin therapy. 

## 2022-06-08 NOTE — Assessment & Plan Note (Signed)
Improving with hydration  Lab Results  Component Value Date   CREATININE 1.11 (H) 06/08/2022   CREATININE 1.23 (H) 06/07/2022   CREATININE 1.41 (H) 06/06/2022

## 2022-06-08 NOTE — Assessment & Plan Note (Signed)
-  continue Plavix °

## 2022-06-08 NOTE — Assessment & Plan Note (Signed)
-   Continue supplement coverage with NovoLog. - hold off metformin. - Blood sugar may creep up as steroids started today

## 2022-06-08 NOTE — Assessment & Plan Note (Signed)
-   secondary to an ascending UTI with left pyelonephritis.  No urine culture sent so I have ordered today - Sepsis manifested by fever and leukocytosis as well as tachypnea. - Continue IV antibiotics.  Await urine culture

## 2022-06-08 NOTE — Progress Notes (Signed)
Mobility Specialist - Progress Note   06/08/22 1000  Mobility  Activity Ambulated with assistance in hallway;Ambulated with assistance to bathroom  Level of Assistance Standby assist, set-up cues, supervision of patient - no hands on  Assistive Device Cane  Distance Ambulated (ft) 170 ft  Activity Response Tolerated well  $Mobility charge 1 Mobility     Pt received in bathroom finishing up BM. Pt able to perform peri-care modI. Ambulated to sink for hand hygiene before continuation into hallway. Mildly unsteady with SPC with 1 LOB noted, corrected by CGA. No complaints. Denied nausea. Pt left in recliner with needs in reach.    Kathee Delton Mobility Specialist 06/08/22, 12:16 PM

## 2022-06-08 NOTE — Assessment & Plan Note (Signed)
Continue IV Rocephin.  Await urine culture.  Remains afebrile.

## 2022-06-09 DIAGNOSIS — R509 Fever, unspecified: Secondary | ICD-10-CM | POA: Diagnosis not present

## 2022-06-09 DIAGNOSIS — A419 Sepsis, unspecified organism: Secondary | ICD-10-CM

## 2022-06-09 DIAGNOSIS — A4150 Gram-negative sepsis, unspecified: Secondary | ICD-10-CM | POA: Diagnosis not present

## 2022-06-09 DIAGNOSIS — N390 Urinary tract infection, site not specified: Secondary | ICD-10-CM | POA: Diagnosis not present

## 2022-06-09 LAB — BLOOD CULTURE ID PANEL (REFLEXED) - BCID2

## 2022-06-09 LAB — BASIC METABOLIC PANEL
Anion gap: 7 (ref 5–15)
BUN: 18 mg/dL (ref 8–23)
CO2: 25 mmol/L (ref 22–32)
Calcium: 8.7 mg/dL — ABNORMAL LOW (ref 8.9–10.3)
Chloride: 107 mmol/L (ref 98–111)
Creatinine, Ser: 1.04 mg/dL — ABNORMAL HIGH (ref 0.44–1.00)
GFR, Estimated: 58 mL/min — ABNORMAL LOW (ref >60)
Glucose, Bld: 225 mg/dL — ABNORMAL HIGH (ref 70–99)
Potassium: 3.8 mmol/L (ref 3.5–5.1)
Sodium: 139 mmol/L (ref 135–145)

## 2022-06-09 LAB — CBC
HCT: 29.5 % — ABNORMAL LOW (ref 36.0–46.0)
Hemoglobin: 9.4 g/dL — ABNORMAL LOW (ref 12.0–15.0)
MCH: 25.4 pg — ABNORMAL LOW (ref 26.0–34.0)
MCHC: 31.9 g/dL (ref 30.0–36.0)
MCV: 79.7 fL — ABNORMAL LOW (ref 80.0–100.0)
Platelets: 376 10*3/uL (ref 150–400)
RBC: 3.7 MIL/uL — ABNORMAL LOW (ref 3.87–5.11)
RDW: 16.1 % — ABNORMAL HIGH (ref 11.5–15.5)
WBC: 8.8 10*3/uL (ref 4.0–10.5)
nRBC: 0 % (ref 0.0–0.2)

## 2022-06-09 LAB — GLUCOSE, CAPILLARY
Glucose-Capillary: 228 mg/dL — ABNORMAL HIGH (ref 70–99)
Glucose-Capillary: 225 mg/dL — ABNORMAL HIGH (ref 70–99)

## 2022-06-09 MED ORDER — CIPROFLOXACIN HCL 500 MG PO TABS: 500.0000 mg | ORAL_TABLET | Freq: Two times a day (BID) | ORAL | 0 refills | Status: AC

## 2022-06-09 MED ORDER — INSULIN ASPART 100 UNIT/ML IJ SOLN: 0.0000 [IU] | Freq: Every day | INTRAMUSCULAR | Status: DC

## 2022-06-09 MED ORDER — CIPROFLOXACIN HCL 500 MG PO TABS: 500.0000 mg | ORAL_TABLET | Freq: Two times a day (BID) | ORAL | Status: DC

## 2022-06-09 MED ORDER — LISINOPRIL 10 MG PO TABS
10.0000 mg | ORAL_TABLET | Freq: Every day | ORAL | Status: DC
  Administered 2022-06-09: 10 mg via ORAL
  Filled 2022-06-09 (×2): qty 1

## 2022-06-09 MED ORDER — INSULIN ASPART 100 UNIT/ML IJ SOLN
0.0000 [IU] | Freq: Three times a day (TID) | INTRAMUSCULAR | Status: DC
  Administered 2022-06-09: 5 [IU] via SUBCUTANEOUS
  Filled 2022-06-09: qty 1

## 2022-06-09 NOTE — Care Management Important Message (Signed)
Important Message  Patient Details  Name: Elizabeth [name] MRN: [id number] Date of Birth: 01/15/1951   Medicare Important Message Given:  Yes  Patient asleep upon time of visit.  Copy of Medicare IM left in room on counter for reference.    Dannette Barbara 06/09/2022, 10:12 AM

## 2022-06-09 NOTE — Inpatient Diabetes Management (Signed)
Inpatient Diabetes Program Recommendations  AACE/ADA: New Consensus Statement on Inpatient Glycemic Control   Target Ranges:  Prepandial:   less than 140 mg/dL      Peak postprandial:   less than 180 mg/dL (1-2 hours)      Critically ill patients:  140 - 180 mg/dL    Latest Reference Range & Units 06/08/22 07:51 06/08/22 11:40 06/08/22 16:28 06/08/22 21:06  Glucose-Capillary 70 - 99 mg/dL 104 (H) 112 (H) 254 (H) 247 (H)   Review of Glycemic Control  Diabetes history: DM2 Outpatient Diabetes medications: Tradjenta 5 mg daily, Metformin 1000 mg QAM Current orders for Inpatient glycemic control: Novolog 0-15 units TID with meals, Novolog 0-5 units QHS, Tradjenta 5 mg daily; Solumedrol 40 mg Q12H  Inpatient Diabetes Program Recommendations:    Insulin: If steroids are continued, please consider ordering Novolog 3 units TID with meals for meal coverage if patient eats at least 50% of meals.   Thanks, Barnie Alderman, RN, MSN, [place] Diabetes Coordinator Inpatient Diabetes Program [phone] (Team Pager from 8am to [place])

## 2022-06-09 NOTE — Discharge Summary (Signed)
Physician Discharge Summary   Patient: Elizabeth [name] MRN: [id number] DOB: [birth year]  Admit date:     06/05/2022  Discharge date: 06/09/22  Discharge Physician: Max Sane   PCP: Marguerita Merles, MD   Recommendations at discharge:    F/up with outpt providers as requested  Discharge Diagnoses: Principal Problem:   Sepsis due to gram-negative UTI [facility]) Active Problems:   Hydroureteronephrosis   Acute renal failure superimposed on stage 3b chronic kidney disease ([place])   Type 2 diabetes mellitus with peripheral neuropathy ([place])   Essential hypertension   History of TIA (transient ischemic attack)   Dyslipidemia   Lower urinary tract infectious disease   Fever  Hospital Course: 71 y.o. Caucasian female with medical history significant for type diabetes mellitus, hypertension, psoriasis, CVA and breast cancer admitted for right pyelonephritis, AKI and mild right hydronephrosis  7/30: Urology recommends conservative management with IV antibiotics 7/31: AKI improving with IV hydration.  On IV antibiotics 8/1: Cough, wheezing and shortness of breath.  Assessment and Plan: * Sepsis due to gram-negative UTI ([place]) - UTI based on UA - secondary to an ascending UTI with left pyelonephritis. - Sepsis manifested by fever and leukocytosis as well as tachypnea. - Blood culture from 7/29 returned today with GNR, BCID detects E coli  Hydroureteronephrosis Urology recommends continuing antibiotics for total 2 weeks  Outpatient urology follow-up.  No procedure planned while in the hospital  Acute renal failure superimposed on stage 3b chronic kidney disease ([place]) Improved with hydration  Lab Results  Component Value Date   CREATININE 1.11 (H) 06/08/2022   CREATININE 1.23 (H) 06/07/2022   CREATININE 1.41 (H) 06/06/2022   Type 2 diabetes mellitus with peripheral neuropathy ([place]) Essential hypertension Dyslipidemia History of TIA (transient ischemic attack) - continue  Plavix         Consultants: Urology Disposition: Home Diet recommendation:  Discharge Diet Orders (From admission, onward)     Start     Ordered   06/09/22 0000  Diet - low sodium heart healthy        06/09/22 1036           Carb modified diet DISCHARGE MEDICATION: Allergies as of 06/09/2022   No Known Allergies      Medication List     STOP taking these medications    albuterol 108 (90 Base) MCG/ACT inhaler Commonly known as: VENTOLIN HFA   gabapentin 300 MG capsule Commonly known as: NEURONTIN   Linzess 145 MCG Caps capsule Generic drug: linaclotide   PARoxetine 40 MG tablet Commonly known as: PAXIL   triamcinolone ointment 0.5 % Commonly known as: KENALOG       TAKE these medications    acetaminophen 325 MG tablet Commonly known as: TYLENOL Take 2 tablets (650 mg total) by mouth every 6 (six) hours as needed for mild pain (or Fever >/= 101).   CALCIUM 1000 + D PO Take 1 tablet by mouth daily.   ciprofloxacin 500 MG tablet Commonly known as: CIPRO Take 1 tablet (500 mg total) by mouth 2 (two) times daily for 10 days.   clopidogrel 75 MG tablet Commonly known as: PLAVIX Take 75 mg by mouth daily.   furosemide 40 MG tablet Commonly known as: LASIX Take 0.5 tablets (20 mg total) by mouth daily.   linagliptin 5 MG Tabs tablet Commonly known as: TRADJENTA Take 5 mg by mouth daily.   lisinopril 10 MG tablet Commonly known as: ZESTRIL Take 1 tablet (10 mg total) by mouth  daily.   metFORMIN 1000 MG tablet Commonly known as: GLUCOPHAGE Take 1,000 mg by mouth daily with breakfast.   potassium chloride 10 MEQ CR capsule Commonly known as: MICRO-K   pravastatin 40 MG tablet Commonly known as: PRAVACHOL Take 40 mg by mouth daily. In afternoon   True Metrix Blood Glucose Test test strip Generic drug: glucose blood   TRUEplus Lancets 33G Misc        Follow-up Information     Marguerita Merles, MD. Schedule an appointment as soon as  possible for a visit in 1 week(s).   Specialty: Family Medicine Why: [facility] Discharge F/UP Contact information: [place] [address] [phone]         Kate Sable, MD. Schedule an appointment as soon as possible for a visit in 3 week(s).   Specialties: Cardiology, Radiology Contact information: [place] [address] [phone]         Abbie Sons, MD. Schedule an appointment as soon as possible for a visit in 2 week(s).   Specialty: Urology Why: [facility] Discharge F/UP Contact information: [place] [address] [phone]                Discharge Exam: Danley Danker Weights   06/05/22 2224 06/06/22 0259  Weight: 93 kg 66.69 kg   71 year old female sitting in the chair without any acute distress Lungs mild expiratory wheezing bilaterally Cardiovascular regular rate and rhythm Abdomen soft, benign Neuro alert and oriented, nonfocal Psych normal mood and affect Skin no rash or lesion  Condition at discharge: fair  The results of significant diagnostics from this hospitalization (including imaging, microbiology, ancillary and laboratory) are listed below for reference.   Imaging Studies: CT Abdomen Pelvis Wo Contrast  Result Date: 06/06/2022 CLINICAL DATA:  Fever, nausea and vomiting. EXAM: CT ABDOMEN AND PELVIS WITHOUT CONTRAST TECHNIQUE: Multidetector CT imaging of the abdomen and pelvis was performed following the standard protocol without IV contrast. RADIATION DOSE REDUCTION: This exam was performed according to the departmental dose-optimization program which includes automated exposure control, adjustment of the mA and/or kV according to patient size and/or use of iterative reconstruction technique. COMPARISON:  None Available. FINDINGS: Lower chest: Bases are clear. The heart is slightly enlarged. The intraventricular blood pool is low in density consistent with  anemia. There are three-vessel coronary artery calcifications. No pericardial effusion. Small hiatal hernia. Hepatobiliary: The liver is 21 cm in length, not well seen due to streak artifact from the patient's right arm in the field but no gross abnormality is evident. There are vascular calcifications. Surgically absent gallbladder without biliary dilatation. Pancreas: Moderately atrophic and otherwise unremarkable. Spleen: Mildly prominent, 13.6 cm transverse. No focal abnormality without contrast. Adrenals/Urinary Tract: There is no adrenal mass. No focal abnormality in the unenhanced renal cortex. Bilateral intrarenal renovascular calcifications are noted but no collecting system stones. There is asymmetric left perinephric stranding and mild left hydroureteronephrosis to the bladder base but no ureteral or intravesical stone. There is no bladder thickening. Pelvic floor laxity with both ureters inserting low in the pelvis. Small cystocele extending below the level of the pubococcygeal line. 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 Small bowel anastomosis left mid abdomen. The appendix is normal. Moderate fecal stasis. Scattered sigmoid diverticula without diverticulitis. Vascular/Lymphatic: Extensive vascular calcifications, with aortoiliac and visceral branch arterial diffuse atherosclerotic changes. Additional vascular calcifications involve the outer uterine wall. There are numerous pelvic phleboliths. Reproductive: The uterus is intact.  No adnexal masses seen. Other: Small umbilical and inguinal fat hernias. There is no free air, free hemorrhage or free fluid. Musculoskeletal: Osteopenia mild degenerative change lumbar spine. No acute or significant osseous findings. IMPRESSION: 1. Asymmetric left renal edema/stranding and mild hydroureteronephrosis without visible stone. The findings could be due to a recently passed stone or ascending UTI. 2. No bladder thickening or intravesical stone, with pelvic floor  laxity and cystocele noted. 3. Mildly prominent liver and spleen. 4. Constipation and diverticulosis. 5. Extensive vascular calcifications. 6. Decreased density of the intraventricular blood in the heart consistent with anemia. Electronically Signed   By: Telford Nab M.D.   On: 06/06/2022 00:54   DG Chest Port 1 View  Result Date: 06/05/2022 CLINICAL DATA:  Vomiting and possible sepsis, initial encounter EXAM: PORTABLE CHEST 1 VIEW COMPARISON:  03/03/2021 FINDINGS: Cardiac shadow is enlarged but stable. The lungs are well aerated without focal infiltrate or effusion. No bony abnormality is noted. IMPRESSION: No acute abnormality noted. Electronically Signed   By: Inez Catalina M.D.   On: 06/05/2022 23:14    Microbiology: Results for orders placed or performed during the hospital encounter of 06/05/22  Culture, blood (Routine x 2)     Status: None (Preliminary result)   Collection Time: 06/05/22 10:32 PM   Specimen: Right Antecubital; Blood  Result Value Ref Range Status   Specimen Description RIGHT ANTECUBITAL  Final   Special Requests   Final    BOTTLES DRAWN AEROBIC AND ANAEROBIC Blood Culture adequate volume   Culture   Final    NO GROWTH 4 DAYS Performed at [facility], [address], [place], [place] [address]    Report Status PENDING  Incomplete  Culture, blood (Routine x 2)     Status: None (Preliminary result)   Collection Time: 06/05/22 11:04 PM   Specimen: BLOOD RIGHT FOREARM  Result Value Ref Range Status   Specimen Description BLOOD RIGHT FOREARM  Final   Special Requests   Final    BOTTLES DRAWN AEROBIC AND ANAEROBIC Blood Culture adequate volume   Culture  Setup Time   Final    GRAM NEGATIVE RODS AEROBIC BOTTLE ONLY Organism ID to follow CRITICAL RESULT CALLED TO, READ BACK BY AND VERIFIED WITHLu Duffel [facility] 1302 06/09/22 HNM Performed at [facility], [address], [place], [place] [address]    Culture GRAM NEGATIVE RODS  Final    Report Status PENDING  Incomplete  SARS Coronavirus 2 by RT PCR (hospital order, performed in [place] hospital lab) *cepheid single result test* Anterior Nasal Swab     Status: None   Collection Time: 06/05/22 11:04 PM   Specimen: Anterior Nasal Swab  Result Value Ref Range Status   SARS Coronavirus 2 by RT PCR NEGATIVE NEGATIVE Final    Comment: (NOTE) SARS-CoV-2 target nucleic acids are NOT DETECTED.  The SARS-CoV-2 RNA is generally detectable in upper and lower respiratory specimens during the acute phase of infection. The lowest concentration of SARS-CoV-2 viral copies this assay can detect is 250 copies / mL. A negative result does not preclude SARS-CoV-2 infection and should not be used as the sole basis for treatment or other patient management decisions.  A negative result may occur with improper specimen collection / handling, submission of specimen other than nasopharyngeal swab, presence of viral mutation(s) within the areas targeted by this assay, and inadequate number of viral copies (<250 copies / mL). A negative result must be combined with clinical observations, patient  history, and epidemiological information.  Fact Sheet for Patients:   https://www.patel.info/  Fact Sheet for Healthcare Providers: https://hall.com/  This test is not yet approved or  cleared by the [place] FDA and has been authorized for detection and/or diagnosis of SARS-CoV-2 by FDA under an Emergency Use Authorization (EUA).  This EUA will remain in effect (meaning this test can be used) for the duration of the COVID-19 declaration under Section 564(b)(1) of the Act, 21 U.S.C. section 360bbb-3(b)(1), unless the authorization is terminated or revoked sooner.  Performed at [facility], [address] [place], [place] [address]   Blood Culture ID Panel (Reflexed)     Status: Abnormal   Collection Time: 06/05/22 11:04 PM  Result  Value Ref Range Status   Enterococcus faecalis NOT DETECTED NOT DETECTED Final   Enterococcus Faecium NOT DETECTED NOT DETECTED Final   Listeria monocytogenes NOT DETECTED NOT DETECTED Final   Staphylococcus species NOT DETECTED NOT DETECTED Final   Staphylococcus aureus (BCID) NOT DETECTED NOT DETECTED Final   Staphylococcus epidermidis NOT DETECTED NOT DETECTED Final   Staphylococcus lugdunensis NOT DETECTED NOT DETECTED Final   Streptococcus species NOT DETECTED NOT DETECTED Final   Streptococcus agalactiae NOT DETECTED NOT DETECTED Final   Streptococcus pneumoniae NOT DETECTED NOT DETECTED Final   Streptococcus pyogenes NOT DETECTED NOT DETECTED Final   A.calcoaceticus-baumannii NOT DETECTED NOT DETECTED Final   Bacteroides fragilis NOT DETECTED NOT DETECTED Final   Enterobacterales DETECTED (A) NOT DETECTED Final    Comment: Enterobacterales represent a large order of gram negative bacteria, not a single organism. CRITICAL RESULT CALLED TO, READ BACK BY AND VERIFIED WITH: DEVAN MITCHELL FIEPPI [id number] 06/09/22 HNM    Enterobacter cloacae complex NOT DETECTED NOT DETECTED Final   Escherichia coli DETECTED (A) NOT DETECTED Final    Comment: CRITICAL RESULT CALLED TO, READ BACK BY AND VERIFIED WITH: DEVAN MITCHELL ACZYSA [id number] 06/09/22 HNM    Klebsiella aerogenes NOT DETECTED NOT DETECTED Final   Klebsiella oxytoca NOT DETECTED NOT DETECTED Final   Klebsiella pneumoniae NOT DETECTED NOT DETECTED Final   Proteus species NOT DETECTED NOT DETECTED Final   Salmonella species NOT DETECTED NOT DETECTED Final   Serratia marcescens NOT DETECTED NOT DETECTED Final   Haemophilus influenzae NOT DETECTED NOT DETECTED Final   Neisseria meningitidis NOT DETECTED NOT DETECTED Final   Pseudomonas aeruginosa NOT DETECTED NOT DETECTED Final   Stenotrophomonas maltophilia NOT DETECTED NOT DETECTED Final   Candida albicans NOT DETECTED NOT DETECTED Final   Candida auris NOT DETECTED NOT DETECTED Final    Candida glabrata NOT DETECTED NOT DETECTED Final   Candida krusei NOT DETECTED NOT DETECTED Final   Candida parapsilosis NOT DETECTED NOT DETECTED Final   Candida tropicalis NOT DETECTED NOT DETECTED Final   Cryptococcus neoformans/gattii NOT DETECTED NOT DETECTED Final   CTX-M ESBL NOT DETECTED NOT DETECTED Final   Carbapenem resistance IMP NOT DETECTED NOT DETECTED Final   Carbapenem resistance KPC NOT DETECTED NOT DETECTED Final   Carbapenem resistance NDM NOT DETECTED NOT DETECTED Final   Carbapenem resist OXA 48 LIKE NOT DETECTED NOT DETECTED Final   Carbapenem resistance VIM NOT DETECTED NOT DETECTED Final    Comment: Performed at [facility], [address] [place], [place] [address]    Labs: CBC: Recent Labs  Lab 06/05/22 2232 06/06/22 0325 06/07/22 0514 06/08/22 0401 06/09/22 0409  WBC 12.9* 11.0* 8.1 8.1 8.8  NEUTROABS 11.2*  --   --   --   --  HGB 9.8* 8.2* 7.9* 8.3* 9.4*  HCT 31.5* 25.9* 25.5* 26.8* 29.5*  MCV 81.2 82.0 82.0 82.2 79.7*  PLT 305 260 265 297 389   Basic Metabolic Panel: Recent Labs  Lab 06/05/22 2232 06/06/22 0325 06/07/22 0514 06/08/22 0401 06/09/22 0409  NA 136 136 139 140 139  K 4.2 3.9 4.1 4.1 3.8  CL 105 107 111 113* 107  CO2 23 23 21* 21* 25  GLUCOSE 190* 219* 109* 112* 225*  BUN 24* '22 17 17 18  '$ CREATININE 1.56* 1.41* 1.23* 1.11* 1.04*  CALCIUM 8.2* 7.9* 8.0* 8.0* 8.7*   Liver Function Tests: Recent Labs  Lab 06/05/22 2232  AST 13*  ALT 9  ALKPHOS 77  BILITOT 0.7  PROT 6.9  ALBUMIN 2.8*   CBG: Recent Labs  Lab 06/08/22 1140 06/08/22 1628 06/08/22 2106 06/09/22 0807 06/09/22 1143  GLUCAP 112* 254* 247* 228* 225*    Discharge time spent: greater than 30 minutes.  Signed: Max Sane, MD [facility] 06/09/2022

## 2022-06-09 NOTE — Progress Notes (Signed)
PHARMACY - PHYSICIAN COMMUNICATION CRITICAL VALUE ALERT - BLOOD CULTURE IDENTIFICATION (BCID)  Elizabeth [name] is an 71 y.o. female who presented to [facility] on 06/05/2022 with a chief complaint of abdominal pain and fever  Assessment:  Blood culture from 7/29 returned today with GNR, BCID detects E coli.  Patient with pyelonephritis with plans to discharge today.    Name of physician (or Provider) Contacted: Dr Manuella Ghazi  Current antibiotics: ceftriaxone to ciprofloxacin  Changes to prescribed antibiotics recommended:  Patient is on recommended antibiotics - No changes needed Patient to discharge today.  Physician spoke with patient and prefers to discharge today. Plan to follow culture result and susceptibility.    Results for orders placed or performed during the hospital encounter of 06/05/22  Blood Culture ID Panel (Reflexed) (Collected: 06/05/2022 11:04 PM)  Result Value Ref Range   Enterococcus faecalis NOT DETECTED NOT DETECTED   Enterococcus Faecium NOT DETECTED NOT DETECTED   Listeria monocytogenes NOT DETECTED NOT DETECTED   Staphylococcus species NOT DETECTED NOT DETECTED   Staphylococcus aureus (BCID) NOT DETECTED NOT DETECTED   Staphylococcus epidermidis NOT DETECTED NOT DETECTED   Staphylococcus lugdunensis NOT DETECTED NOT DETECTED   Streptococcus species NOT DETECTED NOT DETECTED   Streptococcus agalactiae NOT DETECTED NOT DETECTED   Streptococcus pneumoniae NOT DETECTED NOT DETECTED   Streptococcus pyogenes NOT DETECTED NOT DETECTED   A.calcoaceticus-baumannii NOT DETECTED NOT DETECTED   Bacteroides fragilis NOT DETECTED NOT DETECTED   Enterobacterales DETECTED (A) NOT DETECTED   Enterobacter cloacae complex NOT DETECTED NOT DETECTED   Escherichia coli DETECTED (A) NOT DETECTED   Klebsiella aerogenes NOT DETECTED NOT DETECTED   Klebsiella oxytoca NOT DETECTED NOT DETECTED   Klebsiella pneumoniae NOT DETECTED NOT DETECTED   Proteus species NOT DETECTED NOT DETECTED    Salmonella species NOT DETECTED NOT DETECTED   Serratia marcescens NOT DETECTED NOT DETECTED   Haemophilus influenzae NOT DETECTED NOT DETECTED   Neisseria meningitidis NOT DETECTED NOT DETECTED   Pseudomonas aeruginosa NOT DETECTED NOT DETECTED   Stenotrophomonas maltophilia NOT DETECTED NOT DETECTED   Candida albicans NOT DETECTED NOT DETECTED   Candida auris NOT DETECTED NOT DETECTED   Candida glabrata NOT DETECTED NOT DETECTED   Candida krusei NOT DETECTED NOT DETECTED   Candida parapsilosis NOT DETECTED NOT DETECTED   Candida tropicalis NOT DETECTED NOT DETECTED   Cryptococcus neoformans/gattii NOT DETECTED NOT DETECTED   CTX-M ESBL NOT DETECTED NOT DETECTED   Carbapenem resistance IMP NOT DETECTED NOT DETECTED   Carbapenem resistance KPC NOT DETECTED NOT DETECTED   Carbapenem resistance NDM NOT DETECTED NOT DETECTED   Carbapenem resist OXA 48 LIKE NOT DETECTED NOT DETECTED   Carbapenem resistance VIM NOT DETECTED NOT DETECTED    Doreene Eland, PharmD, BCPS, BCIDP Work Cell: [phone] 06/09/2022 3:04 PM

## 2022-06-09 NOTE — Progress Notes (Signed)
Discharge instructions reviewed with patient and husband at the bedside. Patient states she has no further questions at this time. PIV removed with no complications. Patient [place] home with husband

## 2022-06-10 LAB — CULTURE, BLOOD (ROUTINE X 2)
Culture: NO GROWTH
Special Requests: ADEQUATE

## 2022-06-10 NOTE — Progress Notes (Signed)
Cardiology Office Note    Date:  06/11/2022   ID:  Elizabeth [name], DOB 11/17/1950, MRN [id number]  PCP:  Marguerita Merles, MD  Cardiologist:  Kate Sable, MD  Electrophysiologist:  None   Chief Complaint: Hospital follow up  History of Present Illness:   Elizabeth [name] is a 71 y.o. 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 coli bacteremia secondary to UTI complicated by hydroureteronephrosis and AKI who presents for hospital follow-up.  She established care with our office in 07/2021 after having been admitted to the hospital in 04/2021 for TIA.  Echo during that admission showed an EF of 60 to 65%, no regional wall motion abnormalities, normal LV diastolic function parameters, normal RV systolic function and ventricular cavity size, mild mitral regurgitation, and negative bubble study.  Carotid artery ultrasound showed right greater than left carotid bifurcation atherosclerosis with less than 50% stenosis.  Subsequent outpatient cardiac monitoring showed a predominant rhythm of sinus with first-degree AV block, second-degree AV block type I, 2 pauses with the longest being 3.4 seconds, rare atrial and ventricular ectopy, and no evidence of A-fib or flutter.  She was admitted to the hospital from 7/29 through 8/2 for sepsis with E. coli bacteremia secondary to UTI complicated by hydroureteronephrosis and AKI.  High-sensitivity troponin was negative during the admission.  EKG showed sinus rhythm with no acute ST-T changes.  She comes in doing well from a cardiac perspective, and does not symptoms of angina or decompensation.  She does continue to note some fatigue following  her hospital admission.  No dizziness, presyncope, or syncope.  Her lower extremity swelling is stable.  No falls, hematochezia, or melena.  No new focal neurological deficits.  No palpitations.  She has follow-up with PCP and urology later this month.  She does not have any acute cardiac concerns at this time.   Labs independently reviewed: 06/2022 - potassium 3.8, BUN 18, serum creatinine 1.04, Hgb 9.4, PLT 376 05/2022 - A1c 6.4, albumin 2.8, AST/ALT not elevated 04/2021 - TC 132, TG 52, HDL 65, LDL 57 06/2019 - TSH normal Past Medical History:  Diagnosis Date   Cancer ([place])    breast   Carcinoma of right breast ([place]) 05/20/2014   Diabetes mellitus without complication ([place])    Hypertension    Lymphedema    Personal history of radiation therapy 2015   Right breast   PONV (postoperative nausea and vomiting)    Psoriasis 1990   Stroke ([place]) 2000   residual left arm weakness   Varicose veins     Past Surgical History:  Procedure Laterality Date   AMPUTATION Left 03/04/2021   Procedure: AMPUTATION RAY;  Surgeon: Sharlotte Alamo, DPM;  Location: [facility];  Service: Podiatry;  Laterality: Left;   AMPUTATION TOE Right 01/25/2017   Procedure: AMPUTATION TOE   ;  Surgeon: Albertine Patricia, DPM;  Location: [facility];  Service: Podiatry;  Laterality: Right;   BREAST EXCISIONAL BIOPSY Right 2015   [facility] / with radiation   BREAST LUMPECTOMY Right 04-2014   followed by radiation,  no chemo   BREAST REDUCTION SURGERY  1977   CHOLECYSTECTOMY N/A 09/23/2015   Procedure: LAPAROSCOPIC CHOLECYSTECTOMY;  Surgeon:  Bonner Puna, MD;  Location: [facility];  Service: General;  Laterality: N/A;   [place] OF UTERUS  2010   EYE SURGERY Left 2015   cataracts and left eye detached retina repair   EYE SURGERY Right 2013   cataract with len implant   GASTRIC ROUX-EN-Y N/A 09/23/2015   Procedure: LAPAROSCOPIC ROUX-EN-Y GASTRIC;  Surgeon: Bonner Puna, MD;  Location: [facility];  Service: General;  Laterality:  N/A;   HIATAL HERNIA REPAIR N/A 09/23/2015   Procedure: LAPAROSCOPIC REPAIR OF HIATAL HERNIA;  Surgeon: Bonner Puna, MD;  Location: [facility];  Service: General;  Laterality: N/A;   IRRIGATION AND DEBRIDEMENT FOOT Right 01/25/2017   Procedure: IRRIGATION AND DEBRIDEMENT FOOT;  Surgeon: Albertine Patricia, DPM;  Location: [facility];  Service: Podiatry;  Laterality: Right;   IRRIGATION AND DEBRIDEMENT FOOT Right 01/29/2017   Procedure: IRRIGATION AND DEBRIDEMENT FOOT and application of wound vac;  Surgeon: Albertine Patricia, DPM;  Location: [facility];  Service: Podiatry;  Laterality: Right;   LOWER EXTREMITY ANGIOGRAPHY Right 01/28/2017   Procedure: Lower Extremity Angiography;  Surgeon: Katha Cabal, MD;  Location: [facility];  Service: Cardiovascular;  Laterality: Right;   LOWER EXTREMITY ANGIOGRAPHY Left 03/06/2021   Procedure: Lower Extremity Angiography;  Surgeon: Katha Cabal, MD;  Location: [facility];  Service: Cardiovascular;  Laterality: Left;   LOWER EXTREMITY INTERVENTION  01/28/2017   Procedure: Lower Extremity Intervention;  Surgeon: Katha Cabal, MD;  Location: [facility];  Service: Cardiovascular;;   REDUCTION MAMMAPLASTY      Current Medications: Current Meds  Medication Sig   acetaminophen (TYLENOL) 325 MG tablet Take 2 tablets (650 mg total) by mouth every 6 (six) hours as needed for mild pain (or Fever >/= 101).   Calcium Carb-Cholecalciferol (CALCIUM 1000 + D PO) Take 1 tablet by mouth daily.   ciprofloxacin (CIPRO) 500 MG tablet Take 1 tablet (500 mg total) by mouth 2 (two) times daily for 10 days.   clopidogrel (PLAVIX) 75 MG tablet Take 75 mg by mouth daily.   furosemide (LASIX) 40 MG tablet Take 0.5 tablets (20 mg total) by mouth daily.   linagliptin (TRADJENTA) 5 MG TABS tablet Take 5 mg by mouth daily.   lisinopril (ZESTRIL) 10 MG tablet Take 1 tablet (10 mg total) by mouth daily.   pravastatin (PRAVACHOL) 40 MG tablet Take 40 mg by  mouth daily. In afternoon   TRUE METRIX BLOOD GLUCOSE TEST test strip    TRUEPLUS LANCETS 33G MISC     Allergies:   Patient has no known allergies.   Social History   Socioeconomic History   Marital status: Married    Spouse name: Juluis Rainier   Number of children: Not on file   Years of education: Not on file   Highest education level: Not on file  Occupational History   Not on file  Tobacco Use   Smoking status: Never   Smokeless tobacco: Never  Vaping Use   Vaping Use: Never used  Substance and Sexual Activity   Alcohol use: No    Alcohol/week: 0.0 standard drinks of alcohol   Drug use: No   Sexual activity: Yes    Birth control/protection: Post-menopausal  Other Topics Concern   Not on file  Social History Narrative   Not on file   Social Determinants of Health   Financial Resource Strain: Not on file  Food Insecurity: Not on file  Transportation Needs: Not  on file  Physical Activity: Not on file  Stress: Not on file  Social Connections: Not on file     Family History:  The patient's family history includes Breast cancer (age of onset: 81) in her mother; Cancer in her mother; Diabetes in her brother, father, mother, and sister.  ROS:   12-point review of systems is negative unless otherwise noted in HPI.   EKGs/Labs/Other Studies Reviewed:    Studies reviewed were summarized above. The additional studies were reviewed today:  Zio patch 07/2021: Patient had a min HR of 43 bpm, max HR of 203 bpm, and avg HR of 65 bpm. Predominant underlying rhythm was Sinus Rhythm. First Degree AV Block was present. 2 Pauses occurred, the longest lasting 3.4 secs (17 bpm). Second Degree AV Block-Mobitz I  (Wenckebach) was present. Isolated SVEs were rare (<1.0%), SVE Couplets were rare (<1.0%), and SVE Triplets were rare (<1.0%). Isolated VEs were rare (<1.0%), VE Couplets were rare (<1.0%), and no VE Triplets were present. No evidence of atrial fibrillation or atrial  flutter noted to explain symptoms of CVA. __________  Limited echo with bubble study 04/27/2021: 1. Left ventricular ejection fraction, by estimation, is 60 to 65%. The  left ventricle has normal function. The left ventricle has no regional  wall motion abnormalities. Left ventricular diastolic parameters were  normal.   2. Right ventricular systolic function is normal. The right ventricular  size is normal.   3. The mitral valve is normal in structure. Mild mitral valve  regurgitation.   4. The aortic valve is normal in structure. Aortic valve regurgitation is  not visualized.  IAS/Shunts: No atrial level shunt detected by color flow Doppler. Agitated  saline contrast was given intravenously to evaluate for intracardiac  shunting. There is no evidence of a patent foramen ovale. There is no  evidence of an atrial septal defect.  __________  Carotid artery ultrasound 04/27/2021: IMPRESSION: Right greater than left carotid bifurcation atherosclerosis with less than 50% stenosis.   EKG:  EKG is ordered today.  The EKG ordered today demonstrates sinus bradycardia, 59 bpm, low voltage QRS, poor R wave progression along the precordial leads, no significant change when compared to prior tracing  Recent Labs: 06/05/2022: ALT 9 06/09/2022: BUN 18; Creatinine, Ser 1.04; Hemoglobin 9.4; Platelets 376; Potassium 3.8; Sodium 139  Recent Lipid Panel    Component Value Date/Time   CHOL 132 04/27/2021 0555   TRIG 52 04/27/2021 0555   HDL 65 04/27/2021 0555   CHOLHDL 2.0 04/27/2021 0555   VLDL 10 04/27/2021 0555   LDLCALC 57 04/27/2021 0555    PHYSICAL EXAM:    VS:  BP 137/72 (BP Location: Left Arm, Patient Position: Sitting, Cuff Size: Normal)   Pulse (!) 59   Ht '5\' 3"'$  (1.6 m)   Wt 201 lb 3.2 oz (91.3 kg)   SpO2 95%   BMI 35.64 kg/m   BMI: Body mass index is 35.64 kg/m.  Physical Exam Vitals reviewed.  Constitutional:      Appearance: She is well-developed.  HENT:     Head:  Normocephalic and atraumatic.  Eyes:     General:        Right eye: No discharge.        Left eye: No discharge.  Neck:     Comments: JVD difficult to assess secondary to body habitus. Cardiovascular:     Rate and Rhythm: Normal rate and regular rhythm.     Heart sounds: Normal heart sounds, S1 normal  and S2 normal. Heart sounds not distant. No midsystolic click and no opening snap. No murmur heard.    No friction rub.  Pulmonary:     Effort: Pulmonary effort is normal. No respiratory distress.     Breath sounds: Normal breath sounds. No decreased breath sounds, wheezing or rales.  Chest:     Chest wall: No tenderness.  Abdominal:     General: There is no distension.  Musculoskeletal:     Cervical back: Normal range of motion.  Skin:    General: Skin is warm and dry.     Nails: There is no clubbing.  Neurological:     Mental Status: She is alert and oriented to person, place, and time.  Psychiatric:        Speech: Speech normal.        Behavior: Behavior normal.        Thought Content: Thought content normal.        Judgment: Judgment normal.     Wt Readings from Last 3 Encounters:  06/11/22 201 lb 3.2 oz (91.3 kg)  06/06/22 203 lb 4.8 oz (92.2 kg)  05/17/22 197 lb (89.4 kg)     ASSESSMENT & PLAN:   History of CVA and TIA: Cardiac work-up has shown no evidence of structural heart disease or atrial level shunt, or evidence of A-fib/flutter.  She remains on clopidogrel and pravastatin, with ongoing management per PCP and neurology.  No new focal neurological deficits.  HTN: Blood pressure is reasonably controlled in the office today.  She remains on lisinopril.    HLD: LDL 57.  She remains on pravastatin.    Recent sepsis with GNR bacteremia secondary to E. coli with hydroureteronephrosis, AKI, and anemia: Continues to note some ongoing fatigue.  Follow-up with PCP and urology as directed.  DM2 with PVD and polyneuropathy: Ongoing management per podiatry and vascular  surgery.  Lymphedema/venous insufficiency: Has lymphedema pumps.  Managed by vascular surgery.    Disposition: F/u with Dr. Garen Lah or an APP in 9 months.   Medication Adjustments/Labs and Tests Ordered: Current medicines are reviewed at length with the patient today.  Concerns regarding medicines are outlined above. Medication changes, Labs and Tests ordered today are summarized above and listed in the Patient Instructions accessible in Encounters.   Signed, Christell Faith, PA-C 06/11/2022 12:38 PM     [address] [place], [place] [address] [phone]

## 2022-06-11 ENCOUNTER — Ambulatory Visit: Payer: Medicare PPO | Admitting: Physician Assistant

## 2022-06-11 ENCOUNTER — Encounter: Payer: Self-pay | Admitting: Physician Assistant

## 2022-06-11 VITALS — BP 137/72 | HR 59 | Ht 63.0 in | Wt 201.2 lb

## 2022-06-11 DIAGNOSIS — I10 Essential (primary) hypertension: Secondary | ICD-10-CM

## 2022-06-11 DIAGNOSIS — E785 Hyperlipidemia, unspecified: Secondary | ICD-10-CM | POA: Diagnosis not present

## 2022-06-11 DIAGNOSIS — Z8619 Personal history of other infectious and parasitic diseases: Secondary | ICD-10-CM | POA: Diagnosis not present

## 2022-06-11 DIAGNOSIS — D649 Anemia, unspecified: Secondary | ICD-10-CM

## 2022-06-11 DIAGNOSIS — N179 Acute kidney failure, unspecified: Secondary | ICD-10-CM

## 2022-06-11 DIAGNOSIS — Z87898 Personal history of other specified conditions: Secondary | ICD-10-CM

## 2022-06-11 DIAGNOSIS — I639 Cerebral infarction, unspecified: Secondary | ICD-10-CM | POA: Diagnosis not present

## 2022-06-11 LAB — CULTURE, BLOOD (ROUTINE X 2): Special Requests: ADEQUATE

## 2022-06-11 LAB — URINE CULTURE: Culture: 20000 — AB

## 2022-06-11 NOTE — Patient Instructions (Signed)
Medication Instructions:  No changes at this time.   *If you need a refill on your cardiac medications before your next appointment, please call your pharmacy*   Lab Work: None  If you have labs (blood work) drawn today and your tests are completely normal, you will receive your results only by: [place] (if you have MyChart) OR A paper copy in the mail If you have any lab test that is abnormal or we need to change your treatment, we will call you to review the results.   Testing/Procedures: None   Follow-Up: At [facility], you and your health needs are our priority.  As part of our continuing mission to provide you with exceptional heart care, we have created designated Provider Care Teams.  These Care Teams include your primary Cardiologist (physician) and Advanced Practice Providers (APPs -  Physician Assistants and Nurse Practitioners) who all work together to provide you with the care you need, when you need it.  We recommend signing up for the patient portal called "MyChart".  Sign up information is provided on this After Visit Summary.  MyChart is used to connect with patients for Virtual Visits (Telemedicine).  Patients are able to view lab/test results, encounter notes, upcoming appointments, etc.  Non-urgent messages can be sent to your provider as well.   To learn more about what you can do with MyChart, go to [email or web address].    Your next appointment:   9 month(s)  The format for your next appointment:   In Person  Provider:   Kate Sable, MD or Christell Faith, PA-C       Important Information About Sugar

## 2022-06-24 ENCOUNTER — Encounter: Payer: Self-pay | Admitting: Urology

## 2022-06-24 ENCOUNTER — Ambulatory Visit: Payer: Medicare PPO | Admitting: Urology

## 2022-06-24 VITALS — BP 108/60 | HR 66 | Ht 63.0 in | Wt 203.0 lb

## 2022-06-24 DIAGNOSIS — N952 Postmenopausal atrophic vaginitis: Secondary | ICD-10-CM

## 2022-06-24 DIAGNOSIS — Z87440 Personal history of urinary (tract) infections: Secondary | ICD-10-CM | POA: Diagnosis not present

## 2022-06-24 DIAGNOSIS — N390 Urinary tract infection, site not specified: Secondary | ICD-10-CM

## 2022-06-24 MED ORDER — NITROFURANTOIN MACROCRYSTAL 50 MG PO CAPS: 50.0000 mg | ORAL_CAPSULE | Freq: Every day | ORAL | 0 refills | Status: DC

## 2022-06-24 MED ORDER — PREMARIN 0.625 MG/GM VA CREA: TOPICAL_CREAM | VAGINAL | 12 refills | Status: DC

## 2022-06-24 NOTE — Progress Notes (Signed)
06/24/22 3:29 PM   [name] 07-Apr-1951 [id number]  CC: Recurrent UTI, recent right-sided pyelonephritis  HPI: 71 year old female recently hospitalized on 06/05/2022 with right-sided pyelonephritis with some mild right-sided hydronephrosis but no evidence of stones or masses.  She was treated with antibiotics and has improved.  She reports numerous UTIs each year over the last 3 to 5 years.  She denies any urinary symptoms or gross hematuria, no flank pain today.   PMH: Past Medical History:  Diagnosis Date   Cancer [facility])    breast   Carcinoma of right breast ([place]) 05/20/2014   Diabetes mellitus without complication ([place])    Hypertension    Lymphedema    Personal history of radiation therapy 2015   Right breast   PONV (postoperative nausea and vomiting)    Psoriasis 1990   Stroke ([place]) 2000   residual left arm weakness   Varicose veins     Surgical History: Past Surgical History:  Procedure Laterality Date   AMPUTATION Left 03/04/2021   Procedure: AMPUTATION RAY;  Surgeon: Sharlotte Alamo, DPM;  Location: [facility];  Service: Podiatry;  Laterality: Left;   AMPUTATION TOE Right 01/25/2017   Procedure: AMPUTATION TOE   ;  Surgeon: Albertine Patricia, DPM;  Location: [facility];  Service: Podiatry;  Laterality: Right;   BREAST EXCISIONAL BIOPSY Right 2015   [facility] / with radiation   BREAST LUMPECTOMY Right 04-2014   followed by radiation,  no chemo   BREAST REDUCTION SURGERY  1977   CHOLECYSTECTOMY N/A 09/23/2015   Procedure: LAPAROSCOPIC CHOLECYSTECTOMY;  Surgeon: Bonner Puna, MD;  Location: [facility];  Service: General;  Laterality: N/A;   DILATION AND CURETTAGE OF UTERUS  2010   EYE SURGERY Left 2015   cataracts and left eye detached retina repair   EYE SURGERY Right 2013   cataract with len implant   GASTRIC ROUX-EN-Y N/A 09/23/2015   Procedure: LAPAROSCOPIC ROUX-EN-Y GASTRIC;  Surgeon: Bonner Puna, MD;  Location: [facility];  Service: General;  Laterality: N/A;   HIATAL  HERNIA REPAIR N/A 09/23/2015   Procedure: LAPAROSCOPIC REPAIR OF HIATAL HERNIA;  Surgeon: Bonner Puna, MD;  Location: [facility];  Service: General;  Laterality: N/A;   IRRIGATION AND DEBRIDEMENT FOOT Right 01/25/2017   Procedure: IRRIGATION AND DEBRIDEMENT FOOT;  Surgeon: Albertine Patricia, DPM;  Location: [facility];  Service: Podiatry;  Laterality: Right;   IRRIGATION AND DEBRIDEMENT FOOT Right 01/29/2017   Procedure: IRRIGATION AND DEBRIDEMENT FOOT and application of wound vac;  Surgeon: Albertine Patricia, DPM;  Location: [facility];  Service: Podiatry;  Laterality: Right;   LOWER EXTREMITY ANGIOGRAPHY Right 01/28/2017   Procedure: Lower Extremity Angiography;  Surgeon: Katha Cabal, MD;  Location: [facility];  Service: Cardiovascular;  Laterality: Right;   LOWER EXTREMITY ANGIOGRAPHY Left 03/06/2021   Procedure: Lower Extremity Angiography;  Surgeon: Katha Cabal, MD;  Location: [facility];  Service: Cardiovascular;  Laterality: Left;   LOWER EXTREMITY INTERVENTION  01/28/2017   Procedure: Lower Extremity Intervention;  Surgeon: Katha Cabal, MD;  Location: [facility];  Service: Cardiovascular;;   REDUCTION MAMMAPLASTY       Family History: Family History  Problem Relation Age of Onset   Diabetes Mother    Cancer Mother    Breast cancer Mother 10   Diabetes Father    Diabetes Sister    Diabetes Brother     Social History:  reports that she has never smoked. She has  never been exposed to tobacco smoke. She has never used smokeless tobacco. She reports that she does not drink alcohol and does not use drugs.  Physical Exam: BP 108/60   Pulse 66   Ht '5\' 3"'$  (1.6 m)   Wt 203 lb (92.1 kg)   BMI 35.96 kg/m    Constitutional:  Alert and oriented, No acute distress. Cardiovascular: No clubbing, cyanosis, or edema. Respiratory: Normal respiratory effort, no increased work of breathing. GI: Abdomen is soft, nontender, nondistended, no abdominal  masses   Laboratory Data: Reviewed  Pertinent Imaging: I have personally viewed and interpreted the recent CT showing mild right-sided hydroureteronephrosis with no evidence of stones or masses.  Assessment & Plan:   71 year old female with recent hospitalization for right-sided pyelonephritis, as well as a long history of recurrent UTI.  Denies any symptoms or flank pain, gross hematuria today that would warrant further work-up, renal ultrasound, or cystoscopy.  We discussed the evaluation and treatment of patients with recurrent UTIs at length.  We specifically discussed the differences between asymptomatic bacteriuria and true urinary tract infection.  We discussed the AUA definition of recurrent UTI of at least 2 culture proven symptomatic acute cystitis episodes in a 108-monthperiod, or 3 within a 1 year period.  We discussed the importance of culture directed antibiotic treatment, and antibiotic stewardship.  First-line therapy includes nitrofurantoin(5 days), Bactrim(3 days), or fosfomycin(3 g single dose).  Possible etiologies of recurrent infection include periurethral tissue atrophy in postmenopausal woman, constipation, sexual activity, incomplete emptying, anatomic abnormalities, and even genetic predisposition.  Finally, we discussed the role of perineal hygiene, timed voiding, adequate hydration, topical vaginal estrogen, cranberry prophylaxis, and low-dose antibiotic prophylaxis.  Trial of topical estrogen cream for recurrent infections, low-dose nitrofurantoin prophylaxis x90 days RTC 6 months symptom check  BNickolas Madrid MD 06/24/2022  [facility] [address] [place] [phone]

## 2022-07-01 ENCOUNTER — Ambulatory Visit
Admission: RE | Admit: 2022-07-01 | Discharge: 2022-07-01 | Disposition: A | Discharge status: Home / Self Care | Payer: Medicare PPO | Source: Ambulatory Visit | Attending: Family Medicine | Admitting: Family Medicine

## 2022-07-01 DIAGNOSIS — Z1231 Encounter for screening mammogram for malignant neoplasm of breast: Secondary | ICD-10-CM | POA: Diagnosis present

## 2022-10-15 ENCOUNTER — Other Ambulatory Visit: Payer: Self-pay

## 2022-10-15 DIAGNOSIS — N952 Postmenopausal atrophic vaginitis: Secondary | ICD-10-CM

## 2022-10-15 DIAGNOSIS — N390 Urinary tract infection, site not specified: Secondary | ICD-10-CM

## 2022-12-29 ENCOUNTER — Ambulatory Visit: Payer: Medicare PPO | Admitting: Urology

## 2023-01-02 ENCOUNTER — Encounter: Payer: Self-pay | Admitting: Emergency Medicine

## 2023-01-02 ENCOUNTER — Emergency Department: Payer: Medicare PPO

## 2023-01-02 ENCOUNTER — Other Ambulatory Visit: Payer: Self-pay

## 2023-01-02 ENCOUNTER — Emergency Department
Admission: EM | Admit: 2023-01-02 | Discharge: 2023-01-02 | Disposition: A | Discharge status: Home / Self Care | Payer: Medicare PPO | Attending: Emergency Medicine | Admitting: Emergency Medicine

## 2023-01-02 DIAGNOSIS — X58XXXA Exposure to other specified factors, initial encounter: Secondary | ICD-10-CM | POA: Insufficient documentation

## 2023-01-02 DIAGNOSIS — S21001A Unspecified open wound of right breast, initial encounter: Secondary | ICD-10-CM | POA: Insufficient documentation

## 2023-01-02 DIAGNOSIS — S299XXA Unspecified injury of thorax, initial encounter: Secondary | ICD-10-CM | POA: Diagnosis present

## 2023-01-02 DIAGNOSIS — Z853 Personal history of malignant neoplasm of breast: Secondary | ICD-10-CM | POA: Insufficient documentation

## 2023-01-02 LAB — CBC
HCT: 41.8 % (ref 36.0–46.0)
Hemoglobin: 13.1 g/dL (ref 12.0–15.0)
MCH: 27.6 pg (ref 26.0–34.0)
MCHC: 31.3 g/dL (ref 30.0–36.0)
MCV: 88 fL (ref 80.0–100.0)
Platelets: 241 10*3/uL (ref 150–400)
RBC: 4.75 MIL/uL (ref 3.87–5.11)
RDW: 13.7 % (ref 11.5–15.5)
WBC: 8.6 10*3/uL (ref 4.0–10.5)
nRBC: 0 % (ref 0.0–0.2)

## 2023-01-02 LAB — BASIC METABOLIC PANEL
Anion gap: 7 (ref 5–15)
BUN: 24 mg/dL — ABNORMAL HIGH (ref 8–23)
CO2: 29 mmol/L (ref 22–32)
Calcium: 9 mg/dL (ref 8.9–10.3)
Chloride: 103 mmol/L (ref 98–111)
Creatinine, Ser: 1.4 mg/dL — ABNORMAL HIGH (ref 0.44–1.00)
GFR, Estimated: 40 mL/min — ABNORMAL LOW (ref >60)
Glucose, Bld: 157 mg/dL — ABNORMAL HIGH (ref 70–99)
Potassium: 4.6 mmol/L (ref 3.5–5.1)
Sodium: 139 mmol/L (ref 135–145)

## 2023-01-02 NOTE — ED Provider Notes (Signed)
[facility] Provider Note    Event Date/Time   First MD Initiated Contact with Patient 01/02/23 1341     (approximate)   History   Wound Check   HPI  Elizabeth [name] is a 72 y.o. female past medical history significant for remote history of breast cancer presents to the emergency department with area of pain under her right breast.  Over the past 3 weeks endorses swelling and redness to her right breast.  States that there was an area of bleeding and then it "opened up now has a little hole".  Denies any fever or chills or nausea and vomiting.  Mammogram this past year that was normal.  No recent antibiotic use.     Physical Exam   Triage Vital Signs: ED Triage Vitals  Enc Vitals Group     BP 01/02/23 1200 136/67     Pulse Rate 01/02/23 1200 68     Resp 01/02/23 1200 18     Temp 01/02/23 1200 (!) 97.4 F (36.3 C)     Temp Source 01/02/23 1200 Oral     SpO2 01/02/23 1200 98 %     Weight 01/02/23 1200 215 lb (97.5 kg)     Height 01/02/23 1200 '5\' 3"'$  (1.6 m)     Head Circumference --      Peak Flow --      Pain Score 01/02/23 1206 0     Pain Loc --      Pain Edu? --      Excl. in [place]? --     Most recent vital signs: Vitals:   01/02/23 1200  BP: 136/67  Pulse: 68  Resp: 18  Temp: (!) 97.4 F (36.3 C)  SpO2: 98%    Physical Exam Constitutional:      Appearance: She is well-developed.  HENT:     Head: Atraumatic.  Eyes:     Conjunctiva/sclera: Conjunctivae normal.  Cardiovascular:     Rate and Rhythm: Regular rhythm.  Pulmonary:     Effort: No respiratory distress.  Abdominal:     General: There is no distension.  Musculoskeletal:        General: Normal range of motion.     Cervical back: Normal range of motion.  Skin:    General: Skin is warm.     Comments: Inferior aspect of the right breast with area of erythema warmth and induration.  No fluctuance.  No obvious abscess.  Neurological:     Mental Status: She is alert.  Mental status is at baseline.      IMPRESSION / MDM / ASSESSMENT AND PLAN / ED COURSE  I reviewed the triage vital signs and the nursing notes.  Differential diagnosis including cellulitis, breast abscess, malignancy   RADIOLOGY I independently reviewed imaging, my interpretation of imaging: Breast ultrasound obtained to evaluate for breast abscess.   Labs (all labs ordered are listed, but only abnormal results are displayed) Labs interpreted as -    Labs Reviewed  BASIC METABOLIC PANEL - Abnormal; Notable for the following components:      Result Value   Glucose, Bld 157 (*)    BUN 24 (*)    Creatinine, Ser 1.40 (*)    GFR, Estimated 40 (*)    All other components within normal limits  CBC      No significant electrolyte abnormalities.  No significant leukocytosis.  If no signs of a breast abscess will discharge on antibiotics with close follow-up with  primary care provider for further evaluation.  Care transferred to oncoming provider with ultrasound pending.   PROCEDURES:  Critical Care performed: No  Procedures  Patient's presentation is most consistent with acute presentation with potential threat to life or bodily function.   MEDICATIONS ORDERED IN ED: Medications - No data to display  FINAL CLINICAL IMPRESSION(S) / ED DIAGNOSES   Final diagnoses:  Wound of right breast, initial encounter     Rx / DC Orders   ED Discharge Orders     None        Note:  This document was prepared using Dragon voice recognition software and may include unintentional dictation errors.   Nathaniel Man, MD 01/02/23 [phone]

## 2023-01-02 NOTE — ED Triage Notes (Signed)
Pt via POV from home. Pt c/o wound under her R breast, states that she noticed it 3 weeks ago. Denies any pain. Wound noted under the right breast. Denies fever. Pt has a hx of breast cancer in the R breast. Pt is A&OX4 and NAD

## 2023-01-02 NOTE — ED Notes (Addendum)
Pt not in room, attempted to call number listed for pt and number listed for spouse, no answer at this time. Pt not seen leaving room and no staff alerted of pt departure. Charge RN Hunter notified, Lucillie Garfinkel MD notified.

## 2023-01-02 NOTE — ED Provider Notes (Signed)
I received signout on this patient with pending ultrasound of the breast.  The ultrasound was completed with a questionable hypoechoic area.  The plan was going to be that she would receive antibiotics +/- I&D depending on the ultrasound findings.   I went to reassess the patient but she had eloped from the emergency department.  I called her several times but her phone number was unreachable and I was unable to leave a voicemail.  I tried to reach her alternate contact person but there was no response there as well.   Lucillie Garfinkel, MD 01/02/23 [phone]

## 2023-01-05 ENCOUNTER — Encounter: Payer: Self-pay | Admitting: Internal Medicine

## 2023-01-05 ENCOUNTER — Ambulatory Visit: Payer: Medicare PPO | Admitting: Urology

## 2023-01-05 VITALS — BP 123/70 | HR 60 | Ht 63.0 in | Wt 208.0 lb

## 2023-01-05 DIAGNOSIS — N952 Postmenopausal atrophic vaginitis: Secondary | ICD-10-CM | POA: Diagnosis not present

## 2023-01-05 DIAGNOSIS — Z87442 Personal history of urinary calculi: Secondary | ICD-10-CM

## 2023-01-05 DIAGNOSIS — N390 Urinary tract infection, site not specified: Secondary | ICD-10-CM

## 2023-01-05 DIAGNOSIS — N644 Mastodynia: Secondary | ICD-10-CM

## 2023-01-05 MED ORDER — PREMARIN 0.625 MG/GM VA CREA
TOPICAL_CREAM | VAGINAL | 12 refills | Status: DC
Start: 2023-01-05 — End: 2023-08-26

## 2023-01-05 NOTE — Progress Notes (Signed)
   01/05/2023 2:34 PM   Elizabeth [name] Jul 13, 1951 [id number]  Reason for visit: Follow up recurrent UTI  HPI: 72 year old female who I originally saw in August 2023 for follow-up of recurrent UTI and recent right-sided pyelonephritis.  She was hospitalized in July for right-sided pyelonephritis with some mild hydronephrosis and no evidence of stones or masses, she was treated with antibiotics and improved.  She also reported numerous UTIs each year over the last 3 to 5 years.  We opted for a trial of topical estrogen cream and 90 days of low-dose nitrofurantoin prophylaxis.  She denies any UTIs since that time.  She denies any gross hematuria or flank pain.  She is no longer using the estrogen cream.  I recommended she resume the estrogen cream since this had worked for UTI prevention, but she is hesitant.  Risk and benefits were discussed.  Topical estrogen cream refilled, recommended resuming but patient hesitant Follow-up with urology as needed   Billey Co, [place] [address] [place], [place] [address] [phone]

## 2023-01-07 ENCOUNTER — Other Ambulatory Visit: Payer: Self-pay | Admitting: Family Medicine

## 2023-01-07 DIAGNOSIS — R928 Other abnormal and inconclusive findings on diagnostic imaging of breast: Secondary | ICD-10-CM

## 2023-01-19 ENCOUNTER — Ambulatory Visit
Admission: RE | Admit: 2023-01-19 | Discharge: 2023-01-19 | Disposition: A | Discharge status: Home / Self Care | Payer: Medicare PPO | Source: Ambulatory Visit | Attending: Family Medicine | Admitting: Family Medicine

## 2023-01-19 DIAGNOSIS — R928 Other abnormal and inconclusive findings on diagnostic imaging of breast: Secondary | ICD-10-CM

## 2023-05-19 ENCOUNTER — Ambulatory Visit (INDEPENDENT_AMBULATORY_CARE_PROVIDER_SITE_OTHER): Payer: Medicare PPO | Admitting: Vascular Surgery

## 2023-05-26 ENCOUNTER — Encounter (INDEPENDENT_AMBULATORY_CARE_PROVIDER_SITE_OTHER): Payer: Self-pay | Admitting: Vascular Surgery

## 2023-05-26 ENCOUNTER — Ambulatory Visit (INDEPENDENT_AMBULATORY_CARE_PROVIDER_SITE_OTHER): Payer: Medicare PPO | Admitting: Vascular Surgery

## 2023-05-26 VITALS — BP 164/86 | HR 54 | Resp 18 | Ht 63.0 in | Wt 211.0 lb

## 2023-05-26 DIAGNOSIS — I10 Essential (primary) hypertension: Secondary | ICD-10-CM

## 2023-05-26 DIAGNOSIS — Z794 Long term (current) use of insulin: Secondary | ICD-10-CM

## 2023-05-26 DIAGNOSIS — E782 Mixed hyperlipidemia: Secondary | ICD-10-CM

## 2023-05-26 DIAGNOSIS — I872 Venous insufficiency (chronic) (peripheral): Secondary | ICD-10-CM | POA: Diagnosis not present

## 2023-05-26 DIAGNOSIS — I890 Lymphedema, not elsewhere classified: Secondary | ICD-10-CM

## 2023-05-26 DIAGNOSIS — E1142 Type 2 diabetes mellitus with diabetic polyneuropathy: Secondary | ICD-10-CM

## 2023-05-26 NOTE — Progress Notes (Unsigned)
MRN : [id number]  Elizabeth [name] is a 72 y.o. (1951/01/26) female who presents with chief complaint of legs swell.  History of Present Illness:   The patient returns to the office for followup evaluation regarding leg swelling.  The swelling has persisted but with the lymph pump is under much, much better controlled. The pain associated with swelling is decreased. There have not been any interval development of a ulcerations or wounds.   The patient denies problems with the pump, noting it is working well and the leggings are in good condition.   Since the previous visit the patient has been wearing graduated compression stockings and using the lymph pump on a routine basis and  has noted significant improvement in the lymphedema.    Patient stated the lymph pump has been helpful with the treatment of the lymphedema.    No outpatient medications have been marked as taking for the 05/26/23 encounter (Appointment) with Gilda Crease, Latina Craver, MD.    Past Medical History:  Diagnosis Date   Cancer [facility])    breast   Carcinoma of right breast (HCC) 05/20/2014   Diabetes mellitus without complication (HCC)    Hypertension    Lymphedema    Personal history of radiation therapy 2015   Right breast   PONV (postoperative nausea and vomiting)    Psoriasis 1990   Stroke (HCC) 2000   residual left arm weakness   Varicose veins     Past Surgical History:  Procedure Laterality Date   AMPUTATION Left 03/04/2021   Procedure: AMPUTATION RAY;  Surgeon: Linus Galas, DPM;  Location: [facility] ORS;  Service: Podiatry;  Laterality: Left;   AMPUTATION TOE Right 01/25/2017   Procedure: AMPUTATION TOE   ;  Surgeon: Recardo Evangelist, DPM;  Location: [facility];  Service: Podiatry;  Laterality: Right;   BREAST EXCISIONAL BIOPSY Right 2015   Procedure [facility] / with radiation   BREAST LUMPECTOMY Right 04-2014   followed by radiation,  no chemo   BREAST REDUCTION SURGERY  1977   CHOLECYSTECTOMY  N/A 09/23/2015   Procedure: LAPAROSCOPIC CHOLECYSTECTOMY;  Surgeon: Geoffry Paradise, MD;  Location: [facility] OR[facility];  Service: General;  Laterality: N/A;   DILATION AND CURETTAGE OF UTERUS  2010   EYE SURGERY Left 2015   cataracts and left eye detached retina repair   EYE SURGERY Right 2013   cataract with len implant   GASTRIC ROUX-EN-Y N/A 09/23/2015   Procedure: LAPAROSCOPIC ROUX-EN-Y GASTRIC;  Surgeon: Geoffry Paradise, MD;  Location: [facility] OR[facility];  Service: General;  Laterality: N/A;   HIATAL HERNIA REPAIR N/A 09/23/2015   Procedure: LAPAROSCOPIC REPAIR OF HIATAL HERNIA;  Surgeon: Geoffry Paradise, MD;  Location: [facility];  Service: General;  Laterality: N/A;   IRRIGATION AND DEBRIDEMENT FOOT Right 01/25/2017   Procedure: IRRIGATION AND DEBRIDEMENT FOOT;  Surgeon: Recardo Evangelist, DPM;  Location: [facility];  Service: Podiatry;  Laterality: Right;   IRRIGATION AND DEBRIDEMENT FOOT Right 01/29/2017   Procedure: IRRIGATION AND DEBRIDEMENT FOOT and application of wound vac;  Surgeon: Recardo Evangelist, DPM;  Location: [facility];  Service: Podiatry;  Laterality: Right;   LOWER EXTREMITY ANGIOGRAPHY Right 01/28/2017   Procedure: Lower Extremity Angiography;  Surgeon: Renford Dills, MD;  Location: [facility];  Service: Cardiovascular;  Laterality: Right;   LOWER EXTREMITY ANGIOGRAPHY Left 03/06/2021  Procedure: Lower Extremity Angiography;  Surgeon: Renford Dills, MD;  Location: [facility];  Service: Cardiovascular;  Laterality: Left;   LOWER EXTREMITY INTERVENTION  01/28/2017   Procedure: Lower Extremity Intervention;  Surgeon: Renford Dills, MD;  Location: [facility];  Service: Cardiovascular;;   REDUCTION MAMMAPLASTY      Social History Social History   Tobacco Use   Smoking status: Never    Passive exposure: Never   Smokeless tobacco: Never  Vaping Use   Vaping status: Never Used  Substance Use Topics   Alcohol use: No    Alcohol/week: 0.0 standard drinks of alcohol    Drug use: No    Family History Family History  Problem Relation Age of Onset   Diabetes Mother    Cancer Mother    Breast cancer Mother 45   Diabetes Father    Diabetes Sister    Diabetes Brother     No Known Allergies   REVIEW OF SYSTEMS (Negative unless checked)  Constitutional: [] Weight loss  [] Fever  [] Chills Cardiac: [] Chest pain   [] Chest pressure   [] Palpitations   [] Shortness of breath when laying flat   [] Shortness of breath with exertion. Vascular:  [] Pain in legs with walking   [x] Pain in legs with standing  [] History of DVT   [] Phlebitis   [x] Swelling in legs   [] Varicose veins   [] Non-healing ulcers Pulmonary:   [] Uses home oxygen   [] Productive cough   [] Hemoptysis   [] Wheeze  [] COPD   [] Asthma Neurologic:  [] Dizziness   [] Seizures   [] History of stroke   [] History of TIA  [] Aphasia   [] Vissual changes   [] Weakness or numbness in arm   [] Weakness or numbness in leg Musculoskeletal:   [] Joint swelling   [] Joint pain   [] Low back pain Hematologic:  [] Easy bruising  [] Easy bleeding   [] Hypercoagulable state   [] Anemic Gastrointestinal:  [] Diarrhea   [] Vomiting  [] Gastroesophageal reflux/heartburn   [] Difficulty swallowing. Genitourinary:  [] Chronic kidney disease   [] Difficult urination  [] Frequent urination   [] Blood in urine Skin:  [] Rashes   [] Ulcers  Psychological:  [] History of anxiety   []  History of major depression.  Physical Examination  There were no vitals filed for this visit. There is no height or weight on file to calculate BMI. Gen: WD/WN, NAD Head: [place]/AT, No temporalis wasting.  Ear/Nose/Throat: Hearing grossly intact, nares w/o erythema or drainage, pinna without lesions Eyes: PER, EOMI, sclera nonicteric.  Neck: Supple, no gross masses.  No JVD.  Pulmonary:  Good air movement, no audible wheezing, no use of accessory muscles.  Cardiac: RRR, precordium not hyperdynamic. Vascular:  scattered varicosities present bilaterally.  Mild venous stasis  changes to the legs bilaterally. 2-3+ soft pitting edema, CEAP C4sEpAsPr  Vessel Right Left  Radial Palpable Palpable  Gastrointestinal: soft, non-distended. No guarding/no peritoneal signs.  Musculoskeletal: M/S 5/5 throughout.  No deformity.  Neurologic: CN 2-12 intact. Pain and light touch intact in extremities.  Symmetrical.  Speech is fluent. Motor exam as listed above. Psychiatric: Judgment intact, Mood & affect appropriate for pt's clinical situation. Dermatologic: Venous rashes no ulcers noted.  No changes consistent with cellulitis. Lymph : No lichenification or skin changes of chronic lymphedema.  CBC Lab Results  Component Value Date   WBC 8.6 01/02/2023   HGB 13.1 01/02/2023   HCT 41.8 01/02/2023   MCV 88.0 01/02/2023   PLT 241 01/02/2023    BMET    Component Value Date/Time  NA 139 01/02/2023 1208   NA 145 (H) 08/25/2021 1504   NA 139 11/18/2014 1011   K 4.6 01/02/2023 1208   K 3.7 11/18/2014 1011   CL 103 01/02/2023 1208   CL 100 11/18/2014 1011   CO2 29 01/02/2023 1208   CO2 35 (H) 11/18/2014 1011   GLUCOSE 157 (H) 01/02/2023 1208   GLUCOSE 70 11/18/2014 1011   BUN 24 (H) 01/02/2023 1208   BUN 28 (H) 08/25/2021 1504   BUN 22 (H) 11/18/2014 1011   CREATININE 1.40 (H) 01/02/2023 1208   CREATININE 0.88 11/18/2014 1011   CALCIUM 9.0 01/02/2023 1208   CALCIUM 9.0 11/18/2014 1011   GFRNONAA 40 (L) 01/02/2023 1208   GFRNONAA >60 11/18/2014 1011   GFRNONAA >60 01/04/2014 1825   GFRAA 54 (L) 06/27/2019 0506   GFRAA >60 11/18/2014 1011   GFRAA >60 01/04/2014 1825   CrCl cannot be calculated (Patient's most recent lab result is older than the maximum 21 days allowed.).  COAG Lab Results  Component Value Date   INR 1.2 06/06/2022   INR 1.1 06/05/2022   INR 0.9 04/26/2021    Radiology No results found.   Assessment/Plan 1. Lymphedema Recommend:  No surgery or intervention at this point in time.    I have reviewed my discussion with the patient  regarding lymphedema and why it  causes symptoms.  Patient will continue wearing graduated compression on a daily basis. The patient should put the compression on first thing in the morning and removing them in the evening. The patient should not sleep in the compression.   In addition, behavioral modification throughout the day will be continued.  This will include frequent elevation (such as in a recliner), use of over the counter pain medications as needed and exercise such as walking.  The systemic causes for chronic edema such as liver, kidney and cardiac etiologies does not appear to have significant changed over the past year.    The patient will continue aggressive use of the  lymph pump.  This will continue to improve the edema control and prevent sequela such as ulcers and infections.     2. Chronic venous insufficiency See #1  3. Essential hypertension Continue antihypertensive medications as already ordered, these medications have been reviewed and there are no changes at this time.  4. Type 2 diabetes mellitus with diabetic polyneuropathy, with long-term current use of insulin (HCC) Continue hypoglycemic medications as already ordered, these medications have been reviewed and there are no changes at this time.  Hgb A1C to be monitored as already arranged by primary service  5. Mixed hyperlipidemia Continue statin as ordered and reviewed, no changes at this time    Levora Dredge, MD  05/26/2023 4:02 PM

## 2023-05-30 ENCOUNTER — Ambulatory Visit: Payer: Medicare PPO | Admitting: Internal Medicine

## 2023-06-01 ENCOUNTER — Encounter (INDEPENDENT_AMBULATORY_CARE_PROVIDER_SITE_OTHER): Payer: Self-pay | Admitting: Vascular Surgery

## 2023-06-01 ENCOUNTER — Encounter: Payer: Medicare PPO | Attending: Internal Medicine | Admitting: Internal Medicine

## 2023-06-01 DIAGNOSIS — Z923 Personal history of irradiation: Secondary | ICD-10-CM | POA: Diagnosis not present

## 2023-06-01 DIAGNOSIS — S21001A Unspecified open wound of right breast, initial encounter: Secondary | ICD-10-CM | POA: Insufficient documentation

## 2023-06-01 DIAGNOSIS — Z853 Personal history of malignant neoplasm of breast: Secondary | ICD-10-CM | POA: Insufficient documentation

## 2023-06-01 DIAGNOSIS — E11622 Type 2 diabetes mellitus with other skin ulcer: Secondary | ICD-10-CM | POA: Diagnosis not present

## 2023-06-03 NOTE — Progress Notes (Signed)
HOLLE, SHOVER ([id number]) [phone] [email or web address] Page 1 of 5 Visit Report for 06/01/2023 Abuse Risk Screen Details Patient Name: Date of Service: Elizabeth [name], Elizabeth [name]. 06/01/2023 1:30 PM Medical Record Number: [id number] Patient Account Number: [id number] Date of Birth/Sex: Treating RN: January 22, 1951 (72 y.o. [name] Primary Care Jenette Rayson: Darreld Mclean Other Clinician: Referring Royce Sciara: Treating Renesme Kerrigan/Extender: Gerrit Halls in Treatment: 0 Abuse Risk Screen Items Answer ABUSE RISK SCREEN: Has anyone close to you tried to hurt or harm you recentlyo No Do you feel uncomfortable with anyone in your familyo No Has anyone forced you do things that you didnt want to doo No Electronic Signature(s) Signed: 06/03/2023 7:58:02 AM By: Yevonne Pax RN Entered By: Yevonne Pax on 06/01/2023 13:31:38 -------------------------------------------------------------------------------- Activities of Daily Living Details Patient Name: Date of Service: Elizabeth [name], Elizabeth [name]. 06/01/2023 1:30 PM Medical Record Number: [id number] Patient Account Number: [id number] Date of Birth/Sex: Treating RN: 07-29-1951 (72 y.o. [name] Primary Care Max Nuno: Darreld Mclean Other Clinician: Referring Tonea Leiphart: Treating Wyolene Weimann/Extender: Gerrit Halls in Treatment: 0 Activities of Daily Living Items Answer Activities of Daily Living (Please select one for each item) Drive Automobile Not Able T Medications ake Completely Able Use T elephone Completely Able Care for Appearance Completely Able Use T oilet Completely Able Bath / Shower Completely Able Dress Self Completely Able Feed Self Completely Able Walk Completely Able Get In / Out Bed Completely Able Housework Completely AIRIN, HASZ ([id number]) [phone] [email or web address] Page 2 of 5 Prepare Meals Completely Able Handle Money Completely  Able Shop for Self Completely Able Electronic Signature(s) Signed: 06/03/2023 7:58:02 AM By: Yevonne Pax RN Entered By: Yevonne Pax on 06/01/2023 13:32:05 -------------------------------------------------------------------------------- Education Screening Details Patient Name: Date of Service: Elizabeth [name], Elizabeth [name]. 06/01/2023 1:30 PM Medical Record Number: [id number] Patient Account Number: [id number] Date of Birth/Sex: Treating RN: 03/25/1951 (72 y.o. [name] Primary Care Xzaviar Maloof: Darreld Mclean Other Clinician: Referring Krithi Bray: Treating Denisse Whitenack/Extender: Gerrit Halls in Treatment: 0 Primary Learner Assessed: Patient Learning Preferences/Education Level/Primary Language Learning Preference: Explanation Highest Education Level: College or Above Preferred Language: English Cognitive Barrier Language Barrier: No Translator Needed: No Memory Deficit: No Emotional Barrier: No Cultural/Religious Beliefs Affecting Medical Care: No Physical Barrier Impaired Vision: Yes Glasses Impaired Hearing: No Decreased Hand dexterity: No Knowledge/Comprehension Knowledge Level: High Comprehension Level: High Ability to understand written instructions: High Ability to understand verbal instructions: High Motivation Anxiety Level: Anxious Cooperation: Cooperative Education Importance: Acknowledges Need Interest in Health Problems: Asks Questions Perception: Coherent Willingness to Engage in Self-Management High Activities: Readiness to Engage in Self-Management High Activities: Electronic Signature(s) Signed: 06/03/2023 7:58:02 AM By: Yevonne Pax RN Entered By: Yevonne Pax on 06/01/2023 13:32:33 Elizabeth [name] ([id number]) [id number] Page 3 of 5 -------------------------------------------------------------------------------- Fall Risk Assessment Details Patient Name: Date of Service: Elizabeth [name], Elizabeth [name].  06/01/2023 1:30 PM Medical Record Number: [id number] Patient Account Number: [id number] Date of Birth/Sex: Treating RN: 1950/11/24 (72 y.o. [name] Primary Care Wetzel Meester: Darreld Mclean Other Clinician: Referring Rahim Astorga: Treating Delainy Mcelhiney/Extender: Gerrit Halls in Treatment: 0 Fall Risk Assessment Items Have you had 2 or more falls in the last 12 monthso 0 No Have you had any fall that resulted in injury in the last 12 monthso 0 No FALLS RISK SCREEN History of falling - immediate or within 3 months 0 No Secondary diagnosis (Do you have 2 or  more medical diagnoseso) 0 No Ambulatory aid None/bed rest/wheelchair/nurse 0 Yes Crutches/cane/walker 0 No Furniture 0 No Intravenous therapy Access/Saline/Heparin Lock 0 No Gait/Transferring Normal/ bed rest/ wheelchair 0 Yes Weak (short steps with or without shuffle, stooped but able to lift head while walking, may seek 0 No support from furniture) Impaired (short steps with shuffle, may have difficulty arising from chair, head down, impaired 0 No balance) Mental Status Oriented to own ability 0 Yes Electronic Signature(s) Signed: 06/03/2023 7:58:02 AM By: Yevonne Pax RN Entered By: Yevonne Pax on 06/01/2023 13:32:50 -------------------------------------------------------------------------------- Foot Assessment Details Patient Name: Date of Service: Elizabeth [name], Elizabeth [name]. 06/01/2023 1:30 PM Medical Record Number: [id number] Patient Account Number: [id number] Date of Birth/Sex: Treating RN: 1951-03-06 (72 y.o. [name] Primary Care Lido Maske: Darreld Mclean Other Clinician: Referring Kamille Toomey: Treating Najia Hurlbutt/Extender: Gerrit Halls in Treatment: 0 Foot Assessment Items Site Locations Elizabeth [name] ([id number]) [id number] Page 4 of 5 + = Sensation present, - = Sensation absent, C = Callus, U = Ulcer R = Redness, W = Warmth, M =  Maceration, PU = Pre-ulcerative lesion F = Fissure, S = Swelling, D = Dryness Assessment Right: Left: Other Deformity: No No Prior Foot Ulcer: No No Prior Amputation: No No Charcot Joint: No No Ambulatory Status: Ambulatory Without Help Gait: Steady Electronic Signature(s) Signed: 06/03/2023 7:58:02 AM By: Yevonne Pax RN Entered By: Yevonne Pax on 06/01/2023 13:33:16 -------------------------------------------------------------------------------- Nutrition Risk Screening Details Patient Name: Date of Service: Elizabeth [name], Elizabeth [name]. 06/01/2023 1:30 PM Medical Record Number: [id number] Patient Account Number: [id number] Date of Birth/Sex: Treating RN: 1951/07/22 (72 y.o. [name] Primary Care Farah Lepak: Darreld Mclean Other Clinician: Referring Seira Cody: Treating Jariel Drost/Extender: Gerrit Halls in Treatment: 0 Height (in): 63 Weight (lbs): 208 Body Mass Index (BMI): 36.8 Nutrition Risk Screening Items Score Screening NUTRITION RISK SCREEN: I have an illness or condition that made me change the kind and/or amount of food I eat 2 Yes I eat fewer than two meals per day 0 No I eat few fruits and vegetables, or milk products 0 No I have three or more drinks of beer, liquor or wine almost every day 0 No I have tooth or mouth problems that make it hard for me to eat 0 No I don't always have enough money to buy the food I need 0 No Elizabeth [name] ([id number]) [phone] [id number] Page 5 of 5 I eat alone most of the time 0 No I take three or more different prescribed or over-the-counter drugs a day 1 Yes Without wanting to, I have lost or gained 10 pounds in the last six months 0 No I am not always physically able to shop, cook and/or feed myself 0 No Nutrition Protocols Good Risk Protocol Moderate Risk Protocol 0 Provide education on nutrition High Risk Proctocol Risk Level: Moderate Risk Score: 3 Electronic  Signature(s) Signed: 06/03/2023 7:58:02 AM By: Yevonne Pax RN Entered By: Yevonne Pax on 06/01/2023 13:33:01

## 2023-06-03 NOTE — Progress Notes (Signed)
NOGA, MERKLINGER ([id number]) [id number] Page 1 of 9 Visit Report for 06/01/2023 Allergy List Details Patient Name: Date of Service: Elizabeth [name], [place] [name] 06/01/2023 1:30 PM Medical Record Number: [id number] Patient Account Number: [id number] Date of Birth/Sex: Treating RN: [birth year] (72 y.o. [name] Primary Care Tashina Credit: Darreld Mclean Other Clinician: Referring Newman Waren: Treating Natarsha Hurwitz/Extender: Gerrit Halls in Treatment: 0 Allergies Active Allergies No Known Allergies Allergy Notes Electronic Signature(s) Signed: 06/03/2023 7:58:02 AM By: Yevonne Pax RN Entered By: Yevonne Pax on 06/01/2023 13:30:06 -------------------------------------------------------------------------------- Arrival Information Details Patient Name: Date of Service: Elizabeth [name], [place] [name] 06/01/2023 1:30 PM Medical Record Number: [id number] Patient Account Number: [id number] Date of Birth/Sex: Treating RN: 1951/05/14 (72 y.o. [name] Primary Care Brooklyn Jeff: Darreld Mclean Other Clinician: Referring Fionn Stracke: Treating Shivaan Tierno/Extender: Gerrit Halls in Treatment: 0 Visit Information Patient Arrived: Ambulatory Arrival Time: 13:28 Accompanied By: self Transfer Assistance: None Patient Identification Verified: Yes Secondary Verification Process Completed: Yes Patient Requires Transmission-Based Precautions: No Patient Has Alerts: Yes Patient Alerts: Patient on Blood Thinner DIABETIC History Since Last Visit Added or deleted any medications: No Any new allergies or adverse reactions: No Had a fall or experienced change in activities of daily living that may affect risk of falls: No Signs or symptoms of abuse/neglect since last visito No Hospitalized since last visit: No Implantable device outside of the clinic excluding cellular tissue based products placed in the center since last visit: No Electronic  Signature(s) SAAYA, NYHUS ([id number]) [id number] Page 2 of 9 Signed: 06/03/2023 7:58:02 AM By: Yevonne Pax RN Entered By: Yevonne Pax on 06/01/2023 13:29:10 -------------------------------------------------------------------------------- Clinic Level of Care Assessment Details Patient Name: Date of Service: Elizabeth [name], [place] [name] 06/01/2023 1:30 PM Medical Record Number: [id number] Patient Account Number: [id number] Date of Birth/Sex: Treating RN: [birth year] (72 y.o. [name] Primary Care Waverly Tarquinio: Darreld Mclean Other Clinician: Referring Aliveah Gallant: Treating Diondra Pines/Extender: Gerrit Halls in Treatment: 0 Clinic Level of Care Assessment Items TOOL 2 Quantity Score X- 1 0 Use when only an EandM is performed on the INITIAL visit ASSESSMENTS - Nursing Assessment / Reassessment X- 1 20 General Physical Exam (combine w/ comprehensive assessment (listed just below) when performed on new pt. evals) X- 1 25 Comprehensive Assessment (HX, ROS, Risk Assessments, Wounds Hx, etc.) ASSESSMENTS - Wound and Skin A ssessment / Reassessment X - Simple Wound Assessment / Reassessment - one wound 1 5 []  - 0 Complex Wound Assessment / Reassessment - multiple wounds []  - 0 Dermatologic / Skin Assessment (not related to wound area) ASSESSMENTS - Ostomy and/or Continence Assessment and Care []  - 0 Incontinence Assessment and Management []  - 0 Ostomy Care Assessment and Management (repouching, etc.) PROCESS - Coordination of Care X - Simple Patient / Family Education for ongoing care 1 15 []  - 0 Complex (extensive) Patient / Family Education for ongoing care []  - 0 Staff obtains Chiropractor, Records, T Results / Process Orders est []  - 0 Staff telephones HHA, Nursing Homes / Clarify orders / etc []  - 0 Routine Transfer to another Facility (non-emergent condition) []  - 0 Routine Hospital Admission (non-emergent condition) X- 1 15 New  Admissions / Manufacturing engineer / Ordering NPWT Apligraf, etc. , []  - 0 Emergency Hospital Admission (emergent condition) X- 1 10 Simple Discharge Coordination []  - 0 Complex (extensive) Discharge Coordination PROCESS - Special Needs []  - 0 Pediatric / Minor Patient Management []  - 0 Isolation  Patient Management []  - 0 Hearing / Language / Visual special needs []  - 0 Assessment of Community assistance (transportation, D/C planning, etc.) []  - 0 Additional assistance / Altered mentation Lamphear, Patrcia L ([id number]) [id number] Page 3 of 9 []  - 0 Support Surface(s) Assessment (bed, cushion, seat, etc.) INTERVENTIONS - Wound Cleansing / Measurement X- 1 5 Wound Imaging (photographs - any number of wounds) []  - 0 Wound Tracing (instead of photographs) X- 1 5 Simple Wound Measurement - one wound []  - 0 Complex Wound Measurement - multiple wounds X- 1 5 Simple Wound Cleansing - one wound []  - 0 Complex Wound Cleansing - multiple wounds INTERVENTIONS - Wound Dressings X - Small Wound Dressing one or multiple wounds 1 10 []  - 0 Medium Wound Dressing one or multiple wounds []  - 0 Large Wound Dressing one or multiple wounds []  - 0 Application of Medications - injection INTERVENTIONS - Miscellaneous []  - 0 External ear exam []  - 0 Specimen Collection (cultures, biopsies, blood, body fluids, etc.) []  - 0 Specimen(s) / Culture(s) sent or taken to Lab for analysis []  - 0 Patient Transfer (multiple staff / Nurse, adult / Similar devices) []  - 0 Simple Staple / Suture removal (25 or less) []  - 0 Complex Staple / Suture removal (26 or more) []  - 0 Hypo / Hyperglycemic Management (close monitor of Blood Glucose) []  - 0 Ankle / Brachial Index (ABI) - do not check if billed separately Has the patient been seen at the hospital within the last three years: Yes Total Score: 115 Level Of Care: New/Established - Level 3 Electronic  Signature(s) Signed: 06/03/2023 7:58:02 AM By: Yevonne Pax RN Entered By: Yevonne Pax on 06/01/2023 13:54:33 -------------------------------------------------------------------------------- Encounter Discharge Information Details Patient Name: Date of Service: Elizabeth [name], [place] [name] 06/01/2023 1:30 PM Medical Record Number: [id number] Patient Account Number: [id number] Date of Birth/Sex: Treating RN: December 29, 1950 (72 y.o. [name] Primary Care Malkia Nippert: Darreld Mclean Other Clinician: Referring Wylee Ogden: Treating Carsten Carstarphen/Extender: Gerrit Halls in Treatment: 0 Encounter Discharge Information Items Post Procedure Vitals Discharge Condition: Stable Temperature (F): 98.1 Ambulatory Status: Ambulatory Pulse (bpm): 51 Discharge Destination: Home Respiratory Rate (breaths/min): 18 Transportation: Private Auto Blood Pressure (mmHg): 124/62 Accompanied By: self Schedule Follow-up Appointment: Yes Clinical Summary of CareMATELYN, MCFAIL ([id number]) [id number] Page 4 of 9 Electronic Signature(s) Signed: 06/01/2023 1:59:40 PM By: Yevonne Pax RN Entered By: Yevonne Pax on 06/01/2023 13:59:40 -------------------------------------------------------------------------------- Lower Extremity Assessment Details Patient Name: Date of Service: Elizabeth [name], [place] [name] 06/01/2023 1:30 PM Medical Record Number: [id number] Patient Account Number: [id number] Date of Birth/Sex: Treating RN: 1951-06-10 (72 y.o. [name] Primary Care Aurther Harlin: Darreld Mclean Other Clinician: Referring Shanae Luo: Treating Jermarcus Mcfadyen/Extender: Gerrit Halls in Treatment: 0 Electronic Signature(s) Signed: 06/03/2023 7:58:02 AM By: Yevonne Pax RN Entered By: Yevonne Pax on 06/01/2023 13:39:16 -------------------------------------------------------------------------------- Multi Wound Chart Details Patient Name: Date of Service: Elizabeth [name],  [place] [name] 06/01/2023 1:30 PM Medical Record Number: [id number] Patient Account Number: [id number] Date of Birth/Sex: Treating RN: [birth year] (72 y.o. [name] Primary Care Perrie Ragin: Darreld Mclean Other Clinician: Referring Luka Stohr: Treating Mandolin Falwell/Extender: Gerrit Halls in Treatment: 0 Vital Signs Height(in): 63 Pulse(bpm): 51 Weight(lbs): 208 Blood Pressure(mmHg): 124/62 Body Mass Index(BMI): 36.8 Temperature(F): 98.1 Respiratory Rate(breaths/min): 18 [5:Photos:] [N/A:N/A] IRIS, THAXTON ([id number]) [5:Right Breast Wound Location: Gradually Appeared Wounding Event: Infection - not elsewhere classified N/A Primary Etiology: Hypertension, Peripheral Venous Comorbid History:  Disease, Type II Diabetes, Neuropathy, Seizure Disorder, Received Radiation  12/09/2022 Date Acquired: 0 Weeks of Treatment: Open Wound Status: No Wound Recurrence: 1x0.7x0.1 Measurements L x W x D (cm) 0.55 A (cm) : rea 0.055 Volume (cm) : Full Thickness Without Exposed Classification: Support Structures Medium Exudate A mount:  Serosanguineous Exudate Type: red, brown Exudate Color: Medium (34-66%) Granulation A mount: Pink Granulation Quality: None Present (0%) Necrotic A mount: Fat Layer (Subcutaneous Tissue): Yes N/A Exposed Structures: Fascia: No Tendon: No Muscle: No  Joint: No Bone: No None Epithelialization: Chemical/Enzymatic/Mechanical Debridement: Other(saline gauze) Instrument: Moderate Bleeding: Pressure Hemostasis A chieved: 0 Procedural Pain: 0 Post Procedural Pain: Debridement Treatment Response: Procedure  was tolerated well Post Debridement Measurements L x 1x0.7x0.1 W x D (cm) 0.055 Post Debridement Volume: (cm) Debridement Procedures Performed:] [N/A:N/A N/A N/A N/A N/A N/A N/A N/A N/A N/A N/A N/A N/A N/A N/A N/A N/A N/A N/A N/A N/A N/A N/A N/A N/A N/A  N/A N/A] Treatment Notes Wound #5 (Breast) Wound Laterality: Right Cleanser Byram Ancillary Kit - 15 Day  Supply Discharge Instruction: Use supplies as instructed; Kit contains: (15) Saline Bullets; (15) 3x3 Gauze; 15 pr Gloves Soap and Water Discharge Instruction: Gently cleanse wound with antibacterial soap, rinse and pat dry prior to dressing wounds Peri-Wound Care Topical Mupirocin Ointment Discharge Instruction: Apply as directed by Maurine Mowbray. Primary Dressing Prisma 4.34 (in) Discharge Instruction: Moisten w/normal saline or sterile water; Cover wound as directed. Do not remove from wound bed. Secondary Dressing (BORDER) Zetuvit Plus SILICONE BORDER Dressing 4x4 (in/in) Discharge Instruction: Please do not put silicone bordered dressings under wraps. Use non-bordered dressing only. Secured With Compression Wrap Compression Stockings Add-Ons Electronic Signature(s) Signed: 06/01/2023 2:12:08 PM By: Geralyn Corwin DO Previous Signature: 06/01/2023 1:52:17 PM Version By: Yevonne Pax RN Entered By: Geralyn Corwin on 06/01/2023 13:58:42 [name] ([id number]) [id number] Page 6 of 9 -------------------------------------------------------------------------------- Multi-Disciplinary Care Plan Details Patient Name: Date of Service: Elizabeth [name], [place] [name] 06/01/2023 1:30 PM Medical Record Number: [id number] Patient Account Number: [id number] Date of Birth/Sex: Treating RN: 12-Feb-1951 (72 y.o. [name] Primary Care Glenora Morocho: Darreld Mclean Other Clinician: Referring Essica Kiker: Treating Tyonna Talerico/Extender: Gerrit Halls in Treatment: 0 Active Inactive Wound/Skin Impairment Nursing Diagnoses: Knowledge deficit related to ulceration/compromised skin integrity Goals: Patient/caregiver will verbalize understanding of skin care regimen Date Initiated: 06/01/2023 Target Resolution Date: 07/02/2023 Goal Status: Active Ulcer/skin breakdown will have a volume reduction of 30% by week 4 Date Initiated: 06/01/2023 Target Resolution  Date: 07/02/2023 Goal Status: Active Ulcer/skin breakdown will have a volume reduction of 50% by week 8 Date Initiated: 06/01/2023 Target Resolution Date: 08/02/2023 Goal Status: Active Ulcer/skin breakdown will have a volume reduction of 80% by week 12 Date Initiated: 06/01/2023 Target Resolution Date: 09/01/2023 Goal Status: Active Ulcer/skin breakdown will heal within 14 weeks Date Initiated: 06/01/2023 Target Resolution Date: 10/02/2023 Goal Status: Active Interventions: Assess patient/caregiver ability to obtain necessary supplies Assess patient/caregiver ability to perform ulcer/skin care regimen upon admission and as needed Assess ulceration(s) every visit Notes: Electronic Signature(s) Signed: 06/01/2023 1:55:49 PM By: Yevonne Pax RN Entered By: Yevonne Pax on 06/01/2023 13:55:49 -------------------------------------------------------------------------------- Pain Assessment Details Patient Name: Date of Service: Elizabeth [name], [place] [name] 06/01/2023 1:30 PM Medical Record Number: [id number] Patient Account Number: [id number] ARIAHNA, HANKERSON ([id number]) [id number] Page 7 of 9 Date of Birth/Sex: Treating RN: September 13, 1951 (72 y.o. [name] Primary Care Kristia Jupiter: Other Clinician: Darreld Mclean Referring Jenniah Bhavsar: Treating Lakelynn Severtson/Extender: Geralyn Corwin  Darreld Mclean Weeks in Treatment: 0 Active Problems Location of Pain Severity and Description of Pain Patient Has Paino No Site Locations Pain Management and Medication Current Pain Management: Electronic Signature(s) Signed: 06/03/2023 7:58:02 AM By: Yevonne Pax RN Entered By: Yevonne Pax on 06/01/2023 13:29:27 -------------------------------------------------------------------------------- Patient/Caregiver Education Details Patient Name: Date of Service: Elizabeth [name], [place] [name] 7/24/2024andnbsp1:30 PM Medical Record Number: [id number] Patient Account Number: [id number] Date of  Birth/Gender: Treating RN: 15-Oct-1951 (72 y.o. [name] Primary Care Physician: Darreld Mclean Other Clinician: Referring Physician: Treating Physician/Extender: Gerrit Halls in Treatment: 0 Education Assessment Education Provided To: Patient Education Topics Provided Welcome [facility]New [facility] Packet: o Handouts: Welcome [facility] o Methods: Explain/Verbal Responses: State content correctly Electronic Signature(s) FLORA, HILLSTEAD ([id number]) [id number] Page 8 of 9 Signed: 06/03/2023 7:58:02 AM By: Yevonne Pax RN Entered By: Yevonne Pax on 06/01/2023 13:56:01 -------------------------------------------------------------------------------- Wound Assessment Details Patient Name: Date of Service: Elizabeth [name], [place] [name] 06/01/2023 1:30 PM Medical Record Number: [id number] Patient Account Number: [id number] Date of Birth/Sex: Treating RN: July 14, 1951 (72 y.o. [name] Primary Care Tashawn Laswell: Darreld Mclean Other Clinician: Referring Porche Steinberger: Treating Barbarann Kelly/Extender: Gerrit Halls in Treatment: 0 Wound Status Wound Number: 5 Primary Infection - not elsewhere classified Etiology: Wound Location: Right Breast Wound Open Wounding Event: Gradually Appeared Status: Date Acquired: 12/09/2022 Comorbid Hypertension, Peripheral Venous Disease, Type II Diabetes, Weeks Of Treatment: 0 History: Neuropathy, Seizure Disorder, Received Radiation Clustered Wound: No Photos Wound Measurements Length: (cm) 1 Width: (cm) 0.7 Depth: (cm) 0.1 Area: (cm) 0.55 Volume: (cm) 0.055 % Reduction in Area: % Reduction in Volume: Epithelialization: None Tunneling: No Undermining: No Wound Description Classification: Full Thickness Without Exposed Suppor Exudate Amount: Medium Exudate Type: Serosanguineous Exudate Color: red, brown t Structures Foul Odor After Cleansing:  No Slough/Fibrino Yes Wound Bed Granulation Amount: Medium (34-66%) Exposed Structure Granulation Quality: Pink Fascia Exposed: No Necrotic Amount: None Present (0%) Fat Layer (Subcutaneous Tissue) Exposed: Yes Tendon Exposed: No Muscle Exposed: No Joint Exposed: No Bone Exposed: No Treatment Notes Wound #5 (Breast) Wound Laterality: Right WILLIAM, TORSIELLO L ([id number]) [id number] Page 9 of 9 Cleanser Byram Ancillary Kit - 15 Day Supply Discharge Instruction: Use supplies as instructed; Kit contains: (15) Saline Bullets; (15) 3x3 Gauze; 15 pr Gloves Soap and Water Discharge Instruction: Gently cleanse wound with antibacterial soap, rinse and pat dry prior to dressing wounds Peri-Wound Care Topical Mupirocin Ointment Discharge Instruction: Apply as directed by Hallelujah Wysong. Primary Dressing Prisma 4.34 (in) Discharge Instruction: Moisten w/normal saline or sterile water; Cover wound as directed. Do not remove from wound bed. Secondary Dressing (BORDER) Zetuvit Plus SILICONE BORDER Dressing 4x4 (in/in) Discharge Instruction: Please do not put silicone bordered dressings under wraps. Use non-bordered dressing only. Secured With Compression Wrap Compression Stockings Facilities manager) Signed: 06/03/2023 7:58:02 AM By: Yevonne Pax RN Entered By: Yevonne Pax on 06/01/2023 13:44:58 -------------------------------------------------------------------------------- Vitals Details Patient Name: Date of Service: Elizabeth [name], [place] [name] 06/01/2023 1:30 PM Medical Record Number: [id number] Patient Account Number: [id number] Date of Birth/Sex: Treating RN: 1951-05-20 (72 y.o. [name] Primary Care Meggie Laseter: Darreld Mclean Other Clinician: Referring Tiann Saha: Treating Raphel Stickles/Extender: Gerrit Halls in Treatment: 0 Vital Signs Time Taken: 13:29 Temperature (F): 98.1 Height (in): 63 Pulse (bpm): 51 Source:  Stated Respiratory Rate (breaths/min): 18 Weight (lbs): 208 Blood Pressure (mmHg): 124/62 Source: Stated Reference Range: 80 - 120 mg /  dl Body Mass Index (BMI): 36.8 Electronic Signature(s) Signed: 06/03/2023 7:58:02 AM By: Yevonne Pax RN Entered By: Yevonne Pax on 06/01/2023 13:29:56

## 2023-06-03 NOTE — Progress Notes (Signed)
Elizabeth, [name] ([id number]) [id number] Page 1 of 10 Visit Report for 06/01/2023 Chief Complaint Document Details Patient Name: Date of Service: Elizabeth [name], [place] [name] 06/01/2023 1:30 PM Medical Record Number: [id number] Patient Account Number: [id number] Date of Birth/Sex: Treating RN: 21-Jan-1951 (72 y.o. [name] Primary Care Provider: Darreld Mclean Other Clinician: Referring Provider: Treating Provider/Extender: Gerrit Halls in Treatment: 0 Information Obtained from: Patient Chief Complaint Bilateral lower extremity phlebolymphedema, ulcerations, and cellulitis. 10/26/16. Patient is here today for reviewable wound on her left plantar first toe 06/01/2023; right breast wound Electronic Signature(s) Signed: 06/01/2023 2:12:08 PM By: Geralyn Corwin DO Entered By: Geralyn Corwin on 06/01/2023 13:59:07 -------------------------------------------------------------------------------- Debridement Details Patient Name: Date of Service: Elizabeth [name], [place] [name] 06/01/2023 1:30 PM Medical Record Number: [id number] Patient Account Number: [id number] Date of Birth/Sex: Treating RN: 10/11/1951 (72 y.o. [name] Primary Care Provider: Darreld Mclean Other Clinician: Referring Provider: Treating Provider/Extender: Gerrit Halls in Treatment: 0 Debridement Performed for Assessment: Wound #5 Right Breast Performed By: Physician Geralyn Corwin, MD Debridement Type: Chemical/Enzymatic/Mechanical Agent Used: saline gauze Level of Consciousness (Pre-procedure): Awake and Alert Pre-procedure Verification/Time Out No Taken: Start Time: 13:50 Percent of Wound Bed Debrided: Instrument: Other : saline gauze Bleeding: Moderate Hemostasis Achieved: Pressure End Time: 13:52 Procedural Pain: 0 Post Procedural Pain: 0 Response to Treatment: Procedure was tolerated well Level of Consciousness (Post- Awake and  Alert SHERRION, CLUFF ([id number]) [id number] Page 2 of 10 Awake and Alert procedure): Post Debridement Measurements of Total Wound Length: (cm) 1 Width: (cm) 0.7 Depth: (cm) 0.1 Volume: (cm) 0.055 Character of Wound/Ulcer Post Debridement: Improved Post Procedure Diagnosis Same as Pre-procedure Electronic Signature(s) Signed: 06/01/2023 1:57:46 PM By: Yevonne Pax RN Signed: 06/01/2023 2:12:08 PM By: Geralyn Corwin DO Entered By: Yevonne Pax on 06/01/2023 13:57:46 -------------------------------------------------------------------------------- HPI Details Patient Name: Date of Service: Elizabeth [name], [place] [name] 06/01/2023 1:30 PM Medical Record Number: [id number] Patient Account Number: [id number] Date of Birth/Sex: Treating RN: December 04, 1950 (72 y.o. [name] Primary Care Provider: Darreld Mclean Other Clinician: Referring Provider: Treating Provider/Extender: Gerrit Halls in Treatment: 0 History of Present Illness HPI Description: Pleasant 72 year old with past medical history significant for diabetes (hemoglobin A1c 6.1 in February 2016), chronic venous stasis disease, and breast cancer. She developed bilateral lower extremity ulcerations and cellulitis and was hospitalized at [facility] in early March 2016. Ultrasound showed no evidence for DVT Treated with IV vancomycin. Discharged on 01/09/2015 on Bactrim and in Unna boots, which she tolerated well. . She is ambulating per her baseline. No significant pain. No claudication or rest pain. ABI noncompressible. Palpable DP bilaterally. She does not usually wear compression. Cannot apply compression stockings. No fever or chills. No significant drainage. Blood sugars less than 150. 01/28/2015 -- she has not yet received her appointment for a vascular studies both arterial and venous duplex. He is otherwise doing well but has noticed some redness of the  right lower extremity in an area where it is no open ulcer. The left lower extremity feels better. 02/04/2015 -- she has had her vascular studies done in [place] and she goes back to see the doctor tomorrow. I have asked her to get business card to them so that they can send [place] a report. she is not very compliant with her now for dressing and if it sits down she does not use a call to come and get it  fixed. Her hygiene too seems to be rather poor. 02/11/2015 -- we have got the vascular studies done at [facility] and the lower extremity arterial duplex evaluation done on 01/30/2015 revealed there was diffuse vessel wall calcification noted throughout the bilateral lower extremity arterial system there is possible arterial inflow disease. No hemodynamically significant stenosis is present. Bilateral calf arteries were not well visualized. the lower extremity venous duplex study done on 01/30/2015 revealed that the patient had no evidence of DVT on both extremities. There was deep venous reflux involving the left lower extremity. No incompetent bilateral great saphenous veins and right small saphenous vein. She was then seen by Dr. Edilia Bo on 02/05/2015. His opinion was that she had adequate arterial flow although there was some evidenc e of calcified disease. However because she had a biphasic Doppler signal in the dorsalis pedis position he did not think it was necessary to do any further arterial workup. He thought it was safe to use mild compression stockings and elevate her legs. He has written her a prescription for knee-high compression stockings with a gradient of 15-20 mmHg and is encouraged her to wear these. 02/18/2015 is finally got her prescription filled and has brought her stockings of 15-20 mmHg and come ready to use these. READMISSION 10/26/16; this is a 72 year old woman with type 2 diabetes and according to her a history of peripheral neuropathy. Over the last 6 weeks she has noted  a wound on the plantar aspect of her left first toe. There is no obvious precipitating features. She apparently does not have a history of PAD and has had that reviewed previously by vein and vascular in [place]. Her ABI in this clinic however was 1.55. She is been going to her primary physician's office and is been getting DuoDERM which is the primary dressing. She was also on oral antibiotics although I don't think that this was cultured. He tells me she did have a history of wounds previously but is not sure where on her feet. She is wearing ordinary footwear. [facility] she had arterial studies done in March 2016. At that time her ABIs were noncompressible in both legs however Doppler waveforms were multiphasic bilaterally. I do not see that she was actually seen by the physician. She was noted to have diffuse specimen calcification throughout the bilateral lower extremity arterial system. No hemodynamically significant stenosis was present. Bilateral calf arteries were not well visualized which was most likely due to calcifications. DENICIA, MANZER ([id number]) [id number] Page 3 of 10 The patient also has chronic lower extremity edema. Studies of this in March 2016 showed no evidence of a DVT As deep vein reflux involving the left lower . extremity common femoral, femoral and greater saphenous vein. There was reflux on the right involving the great and small saphenous vein I note that she was seen in this clinic previously I think this was for wounds on her legs. She was discharged in compression which she tells me she still wears for chronic venous insufficiency and lymphedema. She also has a history of breast cancer, venous reflux and lymphedema 11/02/16; the area in question is on the plantar aspect of left great toe. This looks better this week. Still some dark areas of nonviable tissue required debridement but overall  improved. 11/09/16; patient arrives today with wound generally looking better. Has been using silver alginate. 11/23/16; patient arrives today with her wound totally epithelialized. This is on the plantar aspect of her left first toe. He  does not have diabetic foot where. Has a Medicare replacement policy through [place] and I've advised her to look at the policy to see if they would cover diabetic shoes with custom inserts. 06/01/2023 Ms. Elizabeth [name] is a 72 year old female with a past medical history of right-sided breast cancer with lumpectomy status post radiation in 2015. She states that about 6 months ago she developed a cyst that opened and drained. She has had trouble healing the wound under the right breast. She had a mammogram and ultrasound on 01/19/2023 for this issue and was noted to have benign postsurgical changes subadjacent to the area of concern in the right lower breast. There is no focal drainable fluid. There is no mammographic evidence of mammary malignancy. She has been using Neosporin to the wound bed. Electronic Signature(s) Signed: 06/01/2023 2:12:08 PM By: Geralyn Corwin DO Entered By: Geralyn Corwin on 06/01/2023 14:02:24 -------------------------------------------------------------------------------- Physical Exam Details Patient Name: Date of Service: Elizabeth [name], [place] [name] 06/01/2023 1:30 PM Medical Record Number: [id number] Patient Account Number: [id number] Date of Birth/Sex: Treating RN: November 12, 1950 (72 y.o. [name] Primary Care Provider: Darreld Mclean Other Clinician: Referring Provider: Treating Provider/Extender: Gerrit Halls in Treatment: 0 Constitutional . Psychiatric . Notes Under the right breast along a previous surgical incision site there is an open wound with fibrinous tissue and telangiectasias to the periwound. Does tunnel at the 3 o'clock position for about 2 cm. No signs of infection including increased warmth,  erythema or purulent drainage. No induration noted. No masses palpated. Electronic Signature(s) Signed: 06/01/2023 2:12:08 PM By: Geralyn Corwin DO Entered By: Geralyn Corwin on 06/01/2023 14:10:26 -------------------------------------------------------------------------------- Physician Orders Details Patient Name: Date of Service: Elizabeth [name], CA [name] 06/01/2023 1:30 PM Medical Record Number: [id number] Patient Account Number: [id number] TELMA, CHRIST ([id number]) [id number] Page 4 of 10 Date of Birth/Sex: Treating RN: 08-25-1951 (72 y.o. [name] Primary Care Provider: Other Clinician: Darreld Mclean Referring Provider: Treating Provider/Extender: Gerrit Halls in Treatment: 0 Verbal / Phone Orders: No Diagnosis Coding Follow-up Appointments Return Appointment in 1 week. Bathing/ Shower/ Hygiene May shower; gently cleanse wound with antibacterial soap, rinse and pat dry prior to dressing wounds Wound Treatment Wound #5 - Breast Wound Laterality: Right Cleanser: [place] Ancillary Kit - 15 Day Supply (DME) (Generic) 1 x Per Day/30 Days Discharge Instructions: Use supplies as instructed; Kit contains: (15) Saline Bullets; (15) 3x3 Gauze; 15 pr Gloves Cleanser: Soap and Water 1 x Per Day/30 Days Discharge Instructions: Gently cleanse wound with antibacterial soap, rinse and pat dry prior to dressing wounds Topical: Mupirocin Ointment 1 x Per Day/30 Days Discharge Instructions: Apply as directed by provider. Prim Dressing: Prisma 4.34 (in) (DME) (Generic) 1 x Per Day/30 Days ary Discharge Instructions: Moisten w/normal saline or sterile water; Cover wound as directed. Do not remove from wound bed. Secondary Dressing: (BORDER) Zetuvit Plus SILICONE BORDER Dressing 4x4 (in/in) (DME) (Generic) 1 x Per Day/30 Days Discharge Instructions: Please do not put silicone bordered dressings under wraps. Use non-bordered dressing  only. Patient Medications llergies: No Known Allergies A Notifications Medication Indication Start End 06/01/2023 mupirocin DOSE 1 - topical 2 % ointment - Apply once daily to the wound bed Electronic Signature(s) Signed: 06/01/2023 3:35:38 PM By: Geralyn Corwin DO Previous Signature: 06/01/2023 2:12:08 PM Version By: Geralyn Corwin DO Previous Signature: 06/01/2023 1:58:24 PM Version By: Yevonne Pax RN Previous Signature: 06/01/2023 1:53:27 PM Version By: Yevonne Pax RN Entered By: Mikey Bussing,  Reverie Vaquera on 06/01/2023 15:35:38 -------------------------------------------------------------------------------- Problem List Details Patient Name: Date of Service: Elizabeth [name], CA [name] 06/01/2023 1:30 PM Medical Record Number: [id number] Patient Account Number: [id number] Date of Birth/Sex: Treating RN: 1950-12-31 (72 y.o. [name] Primary Care Provider: Darreld Mclean Other Clinician: Referring Provider: Treating Provider/Extender: Gerrit Halls in Treatment: 0 Active Problems ICD-10 ELANA, CLITES ([id number]) [id number] Page 5 of 10 Encounter Code Description Active Date MDM Diagnosis S21.001A Unspecified open wound of right breast, initial encounter 06/01/2023 No Yes E11.622 Type 2 diabetes mellitus with other skin ulcer 06/01/2023 No Yes Z85.3 Personal history of malignant neoplasm of breast 06/01/2023 No Yes Z92.3 Personal history of irradiation 06/01/2023 No Yes Inactive Problems Resolved Problems Electronic Signature(s) Signed: 06/01/2023 2:12:08 PM By: Geralyn Corwin DO Entered By: Geralyn Corwin on 06/01/2023 14:08:12 -------------------------------------------------------------------------------- Progress Note Details Patient Name: Date of Service: Elizabeth [name], CA [name] 06/01/2023 1:30 PM Medical Record Number: [id number] Patient Account Number: [id number] Date of Birth/Sex: Treating RN: 1951/05/05 (72  y.o. [name] Primary Care Provider: Darreld Mclean Other Clinician: Referring Provider: Treating Provider/Extender: Gerrit Halls in Treatment: 0 Subjective Chief Complaint Information obtained from Patient Bilateral lower extremity phlebolymphedema, ulcerations, and cellulitis. 10/26/16. Patient is here today for reviewable wound on her left plantar first toe 06/01/2023; right breast wound History of Present Illness (HPI) Pleasant 72 year old with past medical history significant for diabetes (hemoglobin A1c 6.1 in February 2016), chronic venous stasis disease, and breast cancer. She developed bilateral lower extremity ulcerations and cellulitis and was hospitalized at [facility] in early March 2016. Ultrasound showed no evidence for DVT Treated with IV vancomycin. Discharged on 01/09/2015 on Bactrim and in Unna boots, which she tolerated well. . She is ambulating per her baseline. No significant pain. No claudication or rest pain. ABI noncompressible. Palpable DP bilaterally. She does not usually wear compression. Cannot apply compression stockings. No fever or chills. No significant drainage. Blood sugars less than 150. 01/28/2015 -- she has not yet received her appointment for a vascular studies both arterial and venous duplex. He is otherwise doing well but has noticed some redness of the right lower extremity in an area where it is no open ulcer. The left lower extremity feels better. 02/04/2015 -- she has had her vascular studies done in [place] and she goes back to see the doctor tomorrow. I have asked her to get business card to them so that they can send [place] a report. she is not very compliant with her now for dressing and if it sits down she does not use a call to come and get it fixed. Her hygiene too seems to be rather poor. 02/11/2015 -- we have got the vascular studies done at [facility] and the lower extremity arterial duplex  evaluation done on 01/30/2015 revealed there was diffuse vessel wall calcification noted throughout the bilateral lower extremity arterial system there is possible arterial inflow disease. No hemodynamically significant stenosis is present. Bilateral calf arteries were not well visualized. the lower extremity venous duplex study done on 01/30/2015 revealed that the patient had no evidence of DVT on both extremities. There was deep venous reflux involving the left lower extremity. No incompetent bilateral great saphenous veins and right small saphenous vein. She was then seen by Dr. Edilia Bo on 02/05/2015. His opinion was that she had adequate arterial flow although there was some evidenc e of calcified disease. However because she had a biphasic Doppler  signal in the dorsalis pedis position he did not think it was necessary to do any further arterial workup. He SHUANA, BOGUCKI ([id number]) [id number] Page 6 of 10 thought it was safe to use mild compression stockings and elevate her legs. He has written her a prescription for knee-high compression stockings with a gradient of 15-20 mmHg and is encouraged her to wear these. 02/18/2015 is finally got her prescription filled and has brought her stockings of 15-20 mmHg and come ready to use these. READMISSION 10/26/16; this is a 72 year old woman with type 2 diabetes and according to her a history of peripheral neuropathy. Over the last 6 weeks she has noted a wound on the plantar aspect of her left first toe. There is no obvious precipitating features. She apparently does not have a history of PAD and has had that reviewed previously by vein and vascular in [place]. Her ABI in this clinic however was 1.55. She is been going to her primary physician's office and is been getting DuoDERM which is the primary dressing. She was also on oral antibiotics although I don't think that this was cultured. He tells me she did have  a history of wounds previously but is not sure where on her feet. She is wearing ordinary footwear. [facility] she had arterial studies done in March 2016. At that time her ABIs were noncompressible in both legs however Doppler waveforms were multiphasic bilaterally. I do not see that she was actually seen by the physician. She was noted to have diffuse specimen calcification throughout the bilateral lower extremity arterial system. No hemodynamically significant stenosis was present. Bilateral calf arteries were not well visualized which was most likely due to calcifications. The patient also has chronic lower extremity edema. Studies of this in March 2016 showed no evidence of a DVT As deep vein reflux involving the left lower . extremity common femoral, femoral and greater saphenous vein. There was reflux on the right involving the great and small saphenous vein I note that she was seen in this clinic previously I think this was for wounds on her legs. She was discharged in compression which she tells me she still wears for chronic venous insufficiency and lymphedema. She also has a history of breast cancer, venous reflux and lymphedema 11/02/16; the area in question is on the plantar aspect of left great toe. This looks better this week. Still some dark areas of nonviable tissue required debridement but overall improved. 11/09/16; patient arrives today with wound generally looking better. Has been using silver alginate. 11/23/16; patient arrives today with her wound totally epithelialized. This is on the plantar aspect of her left first toe. He does not have diabetic foot where. Has a Medicare replacement policy through [place] and I've advised her to look at the policy to see if they would cover diabetic shoes with custom inserts. 06/01/2023 Ms. Elizabeth [name] is a 71 year old female with a past medical history of right-sided breast cancer with lumpectomy status post  radiation in 2015. She states that about 6 months ago she developed a cyst that opened and drained. She has had trouble healing the wound under the right breast. She had a mammogram and ultrasound on 01/19/2023 for this issue and was noted to have benign postsurgical changes subadjacent to the area of concern in the right lower breast. There is no focal drainable fluid. There is no mammographic evidence of mammary malignancy. She has been using Neosporin to the wound bed. Patient History Information obtained  from Patient. Allergies No Known Allergies Family History Cancer - Mother, Diabetes - Mother,Siblings, No family history of Heart Disease, Hereditary Spherocytosis, Hypertension, Kidney Disease, Lung Disease, Seizures, Stroke, Thyroid Problems, Tuberculosis. Social History Never smoker, Marital Status - Married, Alcohol Use - Never, Drug Use - No History, Caffeine Use - Daily. Medical History Eyes Denies history of Cataracts, Glaucoma, Optic Neuritis Ear/Nose/Mouth/Throat Denies history of Chronic sinus problems/congestion, Middle ear problems Hematologic/Lymphatic Denies history of Anemia, Hemophilia, Human Immunodeficiency Virus, Lymphedema, Sickle Cell Disease Respiratory Denies history of Aspiration, Asthma, Chronic Obstructive Pulmonary Disease (COPD), Pneumothorax, Sleep Apnea, Tuberculosis Cardiovascular Patient has history of Hypertension, Peripheral Venous Disease - venous stasis Denies history of Angina, Arrhythmia, Congestive Heart Failure, Coronary Artery Disease, Deep Vein Thrombosis, Hypotension, Myocardial Infarction, Peripheral Arterial Disease, Phlebitis, Vasculitis Gastrointestinal Denies history of Cirrhosis , Colitis, Crohns, Hepatitis A, Hepatitis B, Hepatitis C Endocrine Patient has history of Type II Diabetes Denies history of Type I Diabetes Genitourinary Denies history of End Stage Renal Disease Immunological Denies history of Lupus Erythematosus,  Raynauds, Scleroderma Integumentary (Skin) Denies history of History of Burn, History of pressure wounds Musculoskeletal Denies history of Gout, Rheumatoid Arthritis, Osteoarthritis, Osteomyelitis Neurologic Patient has history of Neuropathy, Seizure Disorder - years ago Denies history of Dementia, Quadriplegia, Paraplegia Oncologic Patient has history of Received Radiation - breast cancer - right side Denies history of Received Chemotherapy Psychiatric Denies history of Anorexia/bulimia, Confinement Anxiety Patient is treated with Oral Agents. Blood sugar is tested. Blood sugar results noted at the following times: Breakfast - 150. Hospitalization/Surgery History - cellulitis and UTI. 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 Objective Constitutional Vitals Time Taken: 1:29 PM, Height: 63 in, Source: Stated, Weight: 208 lbs, Source: Stated, BMI: 36.8, Temperature: 98.1 F, Pulse: 51 bpm, Respiratory Rate: 18 breaths/min, Blood Pressure: 124/62 mmHg. General Notes: Under the right breast along a previous surgical incision site there is an open wound with fibrinous tissue and telangiectasias to the periwound. Does tunnel at the 3 o'clock position for about 2 cm. No signs of infection including increased warmth, erythema or purulent drainage. No induration noted. No masses palpated. Integumentary (Hair, Skin) Wound #5 status is Open. Original cause of wound was Gradually Appeared. The date acquired was: 12/09/2022. The wound is located on the Right Breast. The wound measures 1cm length x 0.7cm width x 0.1cm depth; 0.55cm^2 area and 0.055cm^3 volume. There is Fat Layer (Subcutaneous Tissue) exposed. There is no tunneling or undermining noted. There is a medium amount of  serosanguineous drainage noted. There is medium (34-66%) pink granulation within the wound bed. There is no necrotic tissue within the wound bed. Assessment Active Problems ICD-10 Unspecified open wound of right breast, initial encounter Type 2 diabetes mellitus with other skin ulcer Personal history of malignant neoplasm of breast Personal history of irradiation Patient presents to the clinic with a 16-month history of chronic wound under the right breast. It sounds like it started out as a potential cyst/abscess that opened and has not healed. The area is along a radiated field and this skin is difficult to heal. She had imaging including mammogram and ultrasound that did not show concerning features for recurrence of malignancy. Will try conservative therapy with collagen and mupirocin ointment. She may need referral to general surgery if unable to make progress over the next 4-8  weeks. Procedures Wound #5 Pre-procedure diagnosis of Wound #5 is an Infection - not elsewhere classified located on the Right Breast . There was a Chemical/Enzymatic/Mechanical debridement performed by Geralyn Corwin, MD. With the following instrument(s): saline gauze. Other agent used was saline gauze. A Moderate amount of bleeding was controlled with Pressure. The procedure was tolerated well with a pain level of 0 throughout and a pain level of 0 following the procedure. Post Debridement Measurements: 1cm length x 0.7cm width x 0.1cm depth; 0.055cm^3 volume. Character of Wound/Ulcer Post Debridement is improved. Post procedure Diagnosis Wound #5: Same as Pre-Procedure Plan Follow-up Appointments: Return Appointment in 1 week. Bathing/ Shower/ Hygiene: May shower; gently cleanse wound with antibacterial soap, rinse and pat dry prior to dressing wounds WOUND #5: - Breast Wound Laterality: Right Cleanser: Byram Ancillary Kit - 15 Day Supply (DME) (Generic) 1 x Per Day/30 Days Discharge Instructions: Use  supplies as instructed; Kit contains: (15) Saline Bullets; (15) 3x3 Gauze; 15 pr Gloves Cleanser: Soap and Water 1 x Per Day/30 Days Discharge Instructions: Gently cleanse wound with antibacterial soap, rinse and pat dry prior to dressing wounds Topical: Mupirocin Ointment 1 x Per Day/30 Days Discharge Instructions: Apply as directed by provider. Prim Dressing: Prisma 4.34 (in) (DME) (Generic) 1 x Per Day/30 Days ary Discharge Instructions: Moisten w/normal saline or sterile water; Cover wound as directed. Do not remove from wound bed. CHRISANNE, PINKERT ([id number]) [id number] Page 8 of 10 Secondary Dressing: (BORDER) Zetuvit Plus SILICONE BORDER Dressing 4x4 (in/in) (DME) (Generic) 1 x Per Day/30 Days Discharge Instructions: Please do not put silicone bordered dressings under wraps. Use non-bordered dressing only. 1. Collagen 2. Mupirocin 3. Follow-up in 1 week Electronic Signature(s) Signed: 06/01/2023 2:12:08 PM By: Geralyn Corwin DO Entered By: Geralyn Corwin on 06/01/2023 14:10:36 -------------------------------------------------------------------------------- ROS/PFSH Details Patient Name: Date of Service: Elizabeth [name], [place] [name] 06/01/2023 1:30 PM Medical Record Number: [id number] Patient Account Number: [id number] Date of Birth/Sex: Treating RN: 01/29/1951 (72 y.o. [name] Primary Care Provider: Darreld Mclean Other Clinician: Referring Provider: Treating Provider/Extender: Gerrit Halls in Treatment: 0 Label Progress Note Print Version as History and Physical for this encounter Information Obtained From Patient Integumentary (Skin) Complaints and Symptoms: Positive for: Wounds Medical History: Negative for: History of Burn; History of pressure wounds Eyes Medical History: Negative for: Cataracts; Glaucoma; Optic Neuritis Past Medical History Notes: left eye blind - detached retina Ear/Nose/Mouth/Throat Medical  History: Negative for: Chronic sinus problems/congestion; Middle ear problems Hematologic/Lymphatic Medical History: Negative for: Anemia; Hemophilia; Human Immunodeficiency Virus; Lymphedema; Sickle Cell Disease Respiratory Medical History: Negative for: Aspiration; Asthma; Chronic Obstructive Pulmonary Disease (COPD); Pneumothorax; Sleep Apnea; Tuberculosis Cardiovascular Medical History: Positive for: Hypertension; Peripheral Venous Disease - venous stasis Negative for: Angina; Arrhythmia; Congestive Heart Failure; Coronary Artery Disease; Deep Vein Thrombosis; Hypotension; Myocardial Infarction; Peripheral Arterial Disease; Phlebitis; Vasculitis TYIANA, MACCHIO ([id number]) [id number] Page 9 of 10 Gastrointestinal Medical History: Negative for: Cirrhosis ; Colitis; Crohns; Hepatitis A; Hepatitis B; Hepatitis C Endocrine Medical History: Positive for: Type II Diabetes Negative for: Type I Diabetes Time with diabetes: 20 years Treated with: Oral agents Blood sugar tested every day: Yes Tested : once a day Blood sugar testing results: Breakfast: 150 Genitourinary Medical History: Negative for: End Stage Renal Disease Immunological Medical History: Negative for: Lupus Erythematosus; Raynauds; Scleroderma Musculoskeletal Medical History: Negative for: Gout; Rheumatoid Arthritis; Osteoarthritis; Osteomyelitis Neurologic Medical History: Positive for: Neuropathy; Seizure Disorder - years ago Negative for: Dementia; Quadriplegia; Paraplegia Past  Medical History Notes: CVA in 2000 Oncologic Medical History: Positive for: Received Radiation - breast cancer - right side Negative for: Received Chemotherapy Psychiatric Medical History: Negative for: Anorexia/bulimia; Confinement Anxiety Immunizations Pneumococcal Vaccine: Received Pneumococcal Vaccination: No Implantable Devices None Hospitalization / Surgery History Type of  Hospitalization/Surgery cellulitis and UTI Family and Social History Cancer: Yes - Mother; Diabetes: Yes - Mother,Siblings; Heart Disease: No; Hereditary Spherocytosis: No; Hypertension: No; Kidney Disease: No; Lung Disease: No; Seizures: No; Stroke: No; Thyroid Problems: No; Tuberculosis: No; Never smoker; Marital Status - Married; Alcohol Use: Never; Drug Use: No History; Caffeine Use: Daily; Financial Concerns: No; Food, Clothing or Shelter Needs: No; Support System Lacking: No; Transportation Concerns: No Electronic Signature(s) Signed: 06/01/2023 2:12:08 PM By: Geralyn Corwin DO Signed: 06/03/2023 7:58:02 AM By: Yevonne Pax RN Entered By: Yevonne Pax on 06/01/2023 13:31:31 [name] ([id number]) [id number] Page 10 of 10 -------------------------------------------------------------------------------- SuperBill Details Patient Name: Date of Service: Elizabeth [name], [place] [name] 06/01/2023 Medical Record Number: [id number] Patient Account Number: [id number] Date of Birth/Sex: Treating RN: 1951/05/10 (72 y.o. [name] Primary Care Provider: Darreld Mclean Other Clinician: Referring Provider: Treating Provider/Extender: Gerrit Halls in Treatment: 0 Diagnosis Coding ICD-10 Codes Code Description S21.001A Unspecified open wound of right breast, initial encounter E11.622 Type 2 diabetes mellitus with other skin ulcer Z85.3 Personal history of malignant neoplasm of breast Z92.3 Personal history of irradiation Facility Procedures : CPT4 Code: 43329518 Description: 99213 - WOUND CARE VISIT-LEV 3 EST PT Modifier: Quantity: 1 Physician Procedures : CPT4 Code Description Modifier [id number] - WC PHYS LEVEL 4 - NEW PT ICD-10 Diagnosis Description S21.001A Unspecified open wound of right breast, initial encounter E11.622 Type 2 diabetes mellitus with other skin ulcer Z85.3 Personal history of  malignant neoplasm of breast Z92.3  Personal history of irradiation Quantity: 1 Electronic Signature(s) Signed: 06/01/2023 2:12:08 PM By: Geralyn Corwin DO Previous Signature: 06/01/2023 1:54:39 PM Version By: Yevonne Pax RN Entered By: Geralyn Corwin on 06/01/2023 14:11:00

## 2023-06-08 ENCOUNTER — Encounter (HOSPITAL_BASED_OUTPATIENT_CLINIC_OR_DEPARTMENT_OTHER): Payer: Medicare PPO | Admitting: Internal Medicine

## 2023-06-08 DIAGNOSIS — E11622 Type 2 diabetes mellitus with other skin ulcer: Secondary | ICD-10-CM | POA: Diagnosis not present

## 2023-06-08 DIAGNOSIS — Z853 Personal history of malignant neoplasm of breast: Secondary | ICD-10-CM

## 2023-06-08 DIAGNOSIS — Z923 Personal history of irradiation: Secondary | ICD-10-CM

## 2023-06-08 DIAGNOSIS — S21001A Unspecified open wound of right breast, initial encounter: Secondary | ICD-10-CM

## 2023-06-08 NOTE — Progress Notes (Signed)
[name], [name] ([id number]) [id number] Page 1 of 9 Visit Report for 06/08/2023 Arrival Information Details Patient Name: Date of Service: Elizabeth [name], [place] [name] 06/08/2023 3:45 PM Medical Record Number: [id number] Patient Account Number: [id number] Date of Birth/Sex: Treating RN: 03/20/1951 (72 y.o. [name] Primary Care Sallyann Kinnaird: Darreld Mclean Other Clinician: Referring Daniela Siebers: Treating Chundra Sauerwein/Extender: Gerrit Halls in Treatment: 1 Visit Information History Since Last Visit Added or deleted any medications: No Patient Arrived: Ambulatory Any new allergies or adverse reactions: No Arrival Time: 15:55 Had a fall or experienced change in No Accompanied By: self activities of daily living that may affect Transfer Assistance: None risk of falls: Patient Identification Verified: Yes Signs or symptoms of abuse/neglect since last visito No Secondary Verification Process Completed: Yes Hospitalized since last visit: No Patient Requires Transmission-Based Precautions: No Implantable device outside of the clinic excluding No Patient Has Alerts: Yes cellular tissue based products placed in the center Patient Alerts: Patient on Blood Thinner since last visit: DIABETIC Has Dressing in Place as Prescribed: Yes Pain Present Now: No Electronic Signature(s) Signed: 06/08/2023 4:21:14 PM By: Yevonne Pax RN Entered By: Yevonne Pax on 06/08/2023 15:58:15 -------------------------------------------------------------------------------- Clinic Level of Care Assessment Details Patient Name: Date of Service: Elizabeth [name], [place] [name] 06/08/2023 3:45 PM Medical Record Number: [id number] Patient Account Number: [id number] Date of Birth/Sex: Treating RN: 1951-02-15 (72 y.o. [name] Primary Care Jamarien Rodkey: Darreld Mclean Other Clinician: Referring Ankush Gintz: Treating Alayzha An/Extender: Gerrit Halls in Treatment:  1 Clinic Level of Care Assessment Items TOOL 4 Quantity Score X- 1 0 Use when only an EandM is performed on FOLLOW-UP visit ASSESSMENTS - Nursing Assessment / Reassessment X- 1 10 Reassessment of Co-morbidities (includes updates in patient status) X- 1 5 Reassessment of Adherence to Treatment Plan AIREL, SCHIRRIPA ([id number]) [id number] Page 2 of 9 ASSESSMENTS - Wound and Skin A ssessment / Reassessment X - Simple Wound Assessment / Reassessment - one wound 1 5 []  - 0 Complex Wound Assessment / Reassessment - multiple wounds []  - 0 Dermatologic / Skin Assessment (not related to wound area) ASSESSMENTS - Focused Assessment []  - 0 Circumferential Edema Measurements - multi extremities []  - 0 Nutritional Assessment / Counseling / Intervention []  - 0 Lower Extremity Assessment (monofilament, tuning fork, pulses) []  - 0 Peripheral Arterial Disease Assessment (using hand held doppler) ASSESSMENTS - Ostomy and/or Continence Assessment and Care []  - 0 Incontinence Assessment and Management []  - 0 Ostomy Care Assessment and Management (repouching, etc.) PROCESS - Coordination of Care X - Simple Patient / Family Education for ongoing care 1 15 []  - 0 Complex (extensive) Patient / Family Education for ongoing care []  - 0 Staff obtains Chiropractor, Records, T Results / Process Orders est []  - 0 Staff telephones HHA, Nursing Homes / Clarify orders / etc []  - 0 Routine Transfer to another Facility (non-emergent condition) []  - 0 Routine Hospital Admission (non-emergent condition) []  - 0 New Admissions / Manufacturing engineer / Ordering NPWT Apligraf, etc. , []  - 0 Emergency Hospital Admission (emergent condition) X- 1 10 Simple Discharge Coordination []  - 0 Complex (extensive) Discharge Coordination PROCESS - Special Needs []  - 0 Pediatric / Minor Patient Management []  - 0 Isolation Patient Management []  - 0 Hearing / Language / Visual special  needs []  - 0 Assessment of Community assistance (transportation, D/C planning, etc.) []  - 0 Additional assistance / Altered mentation []  - 0 Support Surface(s) Assessment (bed, cushion, seat,  etc.) INTERVENTIONS - Wound Cleansing / Measurement X - Simple Wound Cleansing - one wound 1 5 []  - 0 Complex Wound Cleansing - multiple wounds X- 1 5 Wound Imaging (photographs - any number of wounds) []  - 0 Wound Tracing (instead of photographs) X- 1 5 Simple Wound Measurement - one wound []  - 0 Complex Wound Measurement - multiple wounds INTERVENTIONS - Wound Dressings X - Small Wound Dressing one or multiple wounds 1 10 []  - 0 Medium Wound Dressing one or multiple wounds []  - 0 Large Wound Dressing one or multiple wounds []  - 0 Application of Medications - topical []  - 0 Application of Medications - injection INTERVENTIONS - Miscellaneous []  - 0 External ear exam SHERILYN, HAUSWIRTH L ([id number]) [id number] Page 3 of 9 []  - 0 Specimen Collection (cultures, biopsies, blood, body fluids, etc.) []  - 0 Specimen(s) / Culture(s) sent or taken to Lab for analysis []  - 0 Patient Transfer (multiple staff / Michiel Sites Lift / Similar devices) []  - 0 Simple Staple / Suture removal (25 or less) []  - 0 Complex Staple / Suture removal (26 or more) []  - 0 Hypo / Hyperglycemic Management (close monitor of Blood Glucose) []  - 0 Ankle / Brachial Index (ABI) - do not check if billed separately X- 1 5 Vital Signs Has the patient been seen at the hospital within the last three years: Yes Total Score: 75 Level Of Care: New/Established - Level 2 Electronic Signature(s) Signed: 06/08/2023 4:21:14 PM By: Yevonne Pax RN Entered By: Yevonne Pax on 06/08/2023 16:18:51 -------------------------------------------------------------------------------- Encounter Discharge Information Details Patient Name: Date of Service: Elizabeth [name], [place] [name] 06/08/2023 3:45 PM Medical Record  Number: [id number] Patient Account Number: [id number] Date of Birth/Sex: Treating RN: 01-Jul-1951 (72 y.o. [name] Primary Care Amiria Orrison: Darreld Mclean Other Clinician: Referring Deylan Canterbury: Treating Onesha Krebbs/Extender: Gerrit Halls in Treatment: 1 Encounter Discharge Information Items Discharge Condition: Stable Ambulatory Status: Ambulatory Discharge Destination: Home Transportation: Private Auto Accompanied By: self Schedule Follow-up Appointment: Yes Clinical Summary of Care: Electronic Signature(s) Signed: 06/08/2023 4:19:54 PM By: Yevonne Pax RN Entered By: Yevonne Pax on 06/08/2023 16:19:54 -------------------------------------------------------------------------------- Lower Extremity Assessment Details Patient Name: Date of Service: Elizabeth [name], [place] [name] 06/08/2023 3:45 PM [name] ([id number]) [id number] Page 4 of 9 Medical Record Number: [id number] Patient Account Number: [id number] Date of Birth/Sex: Treating RN: 1951/08/02 (72 y.o. [name] Primary Care Kahron Kauth: Darreld Mclean Other Clinician: Referring Angeleen Horney: Treating Hollyann Pablo/Extender: Gerrit Halls in Treatment: 1 Electronic Signature(s) Signed: 06/08/2023 4:21:14 PM By: Yevonne Pax RN Entered By: Yevonne Pax on 06/08/2023 16:03:18 -------------------------------------------------------------------------------- Multi Wound Chart Details Patient Name: Date of Service: Elizabeth [name], [place] [name] 06/08/2023 3:45 PM Medical Record Number: [id number] Patient Account Number: [id number] Date of Birth/Sex: Treating RN: 1951/07/30 (72 y.o. [name] Primary Care Nessie Nong: Darreld Mclean Other Clinician: Referring Latavion Halls: Treating Shayne Deerman/Extender: Gerrit Halls in Treatment: 1 Vital Signs Height(in): 63 Pulse(bpm): 51 Weight(lbs): 208 Blood Pressure(mmHg): 123/62 Body Mass Index(BMI):  36.8 Temperature(F): 98.5 Respiratory Rate(breaths/min): 18 [5:Photos:] [N/A:N/A] Right Breast N/A N/A Wound Location: Gradually Appeared N/A N/A Wounding Event: Infection - not elsewhere classified N/A N/A Primary Etiology: Hypertension, Peripheral Venous N/A N/A Comorbid History: Disease, Type II Diabetes, Neuropathy, Seizure Disorder, Received Radiation 12/09/2022 N/A N/A Date Acquired: 1 N/A N/A Weeks of Treatment: Open N/A N/A Wound Status: No N/A N/A Wound Recurrence: 0.5x0.3x0.1 N/A N/A Measurements L x W x D (cm)  0.118 N/A N/A A (cm) : rea 0.012 N/A N/A Volume (cm) : 78.50% N/A N/A % Reduction in Area: 78.20% N/A N/A % Reduction in Volume: Full Thickness Without Exposed N/A N/A Classification: Support Structures Small N/A N/A Exudate Amount: Serosanguineous N/A N/A Exudate Type: red, brown N/A N/A Exudate Color: Medium (34-66%) N/A N/A Granulation Amount: Pink N/A N/A Granulation Quality: None Present (0%) N/A N/A Necrotic Amount: Fat Layer (Subcutaneous Tissue): Yes N/A N/A Exposed Structures: Fascia: No Tagle, Floreen L ([id number]) [id number] Page 5 of 9 Tendon: No Muscle: No Joint: No Bone: No None N/A N/A Epithelialization: Treatment Notes Electronic Signature(s) Signed: 06/08/2023 4:21:14 PM By: Yevonne Pax RN Entered By: Yevonne Pax on 06/08/2023 16:03:23 -------------------------------------------------------------------------------- Multi-Disciplinary Care Plan Details Patient Name: Date of Service: Elizabeth [name], [place] [name] 06/08/2023 3:45 PM Medical Record Number: [id number] Patient Account Number: [id number] Date of Birth/Sex: Treating RN: Jan 28, 1951 (72 y.o. [name] Primary Care Brandyn Thien: Darreld Mclean Other Clinician: Referring Heena Woodbury: Treating Jaiyah Beining/Extender: Gerrit Halls in Treatment: 1 Active Inactive Wound/Skin Impairment Nursing Diagnoses: Knowledge deficit  related to ulceration/compromised skin integrity Goals: Patient/caregiver will verbalize understanding of skin care regimen Date Initiated: 06/01/2023 Target Resolution Date: 07/02/2023 Goal Status: Active Ulcer/skin breakdown will have a volume reduction of 30% by week 4 Date Initiated: 06/01/2023 Target Resolution Date: 07/02/2023 Goal Status: Active Ulcer/skin breakdown will have a volume reduction of 50% by week 8 Date Initiated: 06/01/2023 Target Resolution Date: 08/02/2023 Goal Status: Active Ulcer/skin breakdown will have a volume reduction of 80% by week 12 Date Initiated: 06/01/2023 Target Resolution Date: 09/01/2023 Goal Status: Active Ulcer/skin breakdown will heal within 14 weeks Date Initiated: 06/01/2023 Target Resolution Date: 10/02/2023 Goal Status: Active Interventions: Assess patient/caregiver ability to obtain necessary supplies Assess patient/caregiver ability to perform ulcer/skin care regimen upon admission and as needed Assess ulceration(s) every visit Notes: Electronic Signature(s) Signed: 06/08/2023 4:21:14 PM By: Yevonne Pax RN Entered By: Yevonne Pax on 06/08/2023 16:03:34 [name] ([id number]) [id number] Page 6 of 9 -------------------------------------------------------------------------------- Pain Assessment Details Patient Name: Date of Service: Elizabeth [name], [place] [name] 06/08/2023 3:45 PM Medical Record Number: [id number] Patient Account Number: [id number] Date of Birth/Sex: Treating RN: Nov 05, 1951 (72 y.o. [name] Primary Care Savanna Dooley: Darreld Mclean Other Clinician: Referring Tomeeka Plaugher: Treating Kierra Jezewski/Extender: Gerrit Halls in Treatment: 1 Active Problems Location of Pain Severity and Description of Pain Patient Has Paino No Site Locations Pain Management and Medication Current Pain Management: Electronic Signature(s) Signed: 06/08/2023 4:21:14 PM By: Yevonne Pax RN Entered By:  Yevonne Pax on 06/08/2023 15:59:17 -------------------------------------------------------------------------------- Patient/Caregiver Education Details Patient Name: Date of Service: Elizabeth [name], [place] [name] 7/31/2024andnbsp3:45 PM Medical Record Number: [id number] Patient Account Number: [id number] Date of Birth/Gender: Treating RN: 23-Sep-1951 (72 y.o. [name] Primary Care Physician: Darreld Mclean Other Clinician: Referring Physician: Treating Physician/Extender: Gerrit Halls in Treatment: 1 AMINA, MAGNUSON ([id number]) [id number] Page 7 of 9 Education Assessment Education Provided To: Patient Education Topics Provided Wound/Skin Impairment: Handouts: Caring for Your Ulcer Methods: Printed Responses: State content correctly Electronic Signature(s) Signed: 06/08/2023 4:21:14 PM By: Yevonne Pax RN Entered By: Yevonne Pax on 06/08/2023 16:03:50 -------------------------------------------------------------------------------- Wound Assessment Details Patient Name: Date of Service: Elizabeth [name], [place] [name] 06/08/2023 3:45 PM Medical Record Number: [id number] Patient Account Number: [id number] Date of Birth/Sex: Treating RN: March 04, 1951 (72 y.o. [name] Primary Care Lakrisha Iseman: Darreld Mclean Other Clinician: Referring Jamarea Selner: Treating Nhyla Nappi/Extender: Geralyn Corwin  Darreld Mclean Weeks in Treatment: 1 Wound Status Wound Number: 5 Primary Infection - not elsewhere classified Etiology: Wound Location: Right Breast Wound Open Wounding Event: Gradually Appeared Status: Date Acquired: 12/09/2022 Comorbid Hypertension, Peripheral Venous Disease, Type II Diabetes, Weeks Of Treatment: 1 History: Neuropathy, Seizure Disorder, Received Radiation Clustered Wound: No Photos Wound Measurements Length: (cm) 0.5 Width: (cm) 0.3 Depth: (cm) 0.1 Area: (cm) 0.118 Volume: (cm) 0.012 % Reduction in Area: 78.5% % Reduction in  Volume: 78.2% Epithelialization: None Wound Description Classification: Full Thickness Without Exposed Support Exudate Amount: Small Exudate Type: Serosanguineous Leandro, Kadian L ([id number]) Exudate Color: red, brown Structures Foul Odor After Cleansing: No Slough/Fibrino No [id number] Page 8 of 9 Wound Bed Granulation Amount: Medium (34-66%) Exposed Structure Granulation Quality: Pink Fascia Exposed: No Necrotic Amount: None Present (0%) Fat Layer (Subcutaneous Tissue) Exposed: Yes Tendon Exposed: No Muscle Exposed: No Joint Exposed: No Bone Exposed: No Treatment Notes Wound #5 (Breast) Wound Laterality: Right Cleanser Byram Ancillary Kit - 15 Day Supply Discharge Instruction: Use supplies as instructed; Kit contains: (15) Saline Bullets; (15) 3x3 Gauze; 15 pr Gloves Soap and Water Discharge Instruction: Gently cleanse wound with antibacterial soap, rinse and pat dry prior to dressing wounds Peri-Wound Care Topical Mupirocin Ointment Discharge Instruction: Apply as directed by Avante Carneiro. Primary Dressing Prisma 4.34 (in) Discharge Instruction: Moisten w/normal saline or sterile water; Cover wound as directed. Do not remove from wound bed. Secondary Dressing (BORDER) Zetuvit Plus SILICONE BORDER Dressing 4x4 (in/in) Discharge Instruction: Please do not put silicone bordered dressings under wraps. Use non-bordered dressing only. Secured With Compression Wrap Compression Stockings Facilities manager) Signed: 06/08/2023 4:21:14 PM By: Yevonne Pax RN Entered By: Yevonne Pax on 06/08/2023 16:03:02 -------------------------------------------------------------------------------- Vitals Details Patient Name: Date of Service: Elizabeth [name], [place] [name] 06/08/2023 3:45 PM Medical Record Number: [id number] Patient Account Number: [id number] Date of Birth/Sex: Treating RN: 02-04-1951 (72 y.o. [name] Primary Care Trae Bovenzi: Darreld Mclean Other Clinician: Referring Jahmad Petrich: Treating Suriya Kovarik/Extender: Gerrit Halls in Treatment: 1 Vital Signs Time Taken: 15:58 Temperature (F): 98.5 Height (in): 63 Pulse (bpm): 51 Weight (lbs): 208 Respiratory Rate (breaths/min): 18 Body Mass Index (BMI): 36.8 Blood Pressure (mmHg): 123/62 ADALEI, FAULK ([id number]) [id number] Page 9 of 9 Reference Range: 80 - 120 mg / dl Electronic Signature(s) Signed: 06/08/2023 4:21:14 PM By: Yevonne Pax RN Entered By: Yevonne Pax on 06/08/2023 15:58:39

## 2023-06-09 NOTE — Progress Notes (Signed)
Elizabeth, [name] ([id number]) [id number] Page 1 of 8 Visit Report for 06/08/2023 Chief Complaint Document Details Patient Name: Date of Service: Elizabeth [name], [place] Elizabeth LYN L. 06/08/2023 3:45 PM Medical Record Number: [id number] Patient Account Number: [id number] Date of Birth/Sex: Treating RN: 08/01/1951 (72 y.o. Elizabeth [name] Primary Care Provider: Darreld Mclean Other Clinician: Referring Provider: Treating Provider/Extender: Gerrit Halls in Treatment: 1 Information Obtained from: Patient Chief Complaint Bilateral lower extremity phlebolymphedema, ulcerations, and cellulitis. 10/26/16. Patient is here today for reviewable wound on her left plantar first toe 06/01/2023; right breast wound Electronic Signature(s) Signed: 06/08/2023 4:29:16 PM By: Geralyn Corwin DO Entered By: Geralyn Corwin on 06/08/2023 16:25:14 -------------------------------------------------------------------------------- HPI Details Patient Name: Date of Service: Elizabeth [name], [place] Elizabeth LYN L. 06/08/2023 3:45 PM Medical Record Number: [id number] Patient Account Number: [id number] Date of Birth/Sex: Treating RN: 07/29/1951 (72 y.o. Elizabeth [name] Primary Care Provider: Darreld Mclean Other Clinician: Referring Provider: Treating Provider/Extender: Gerrit Halls in Treatment: 1 History of Present Illness HPI Description: Pleasant 72 year old with past medical history significant for diabetes (hemoglobin A1c 6.1 in February 2016), chronic venous stasis disease, and breast cancer. She developed bilateral lower extremity ulcerations and cellulitis and was hospitalized at [facility] in early March 2016. Ultrasound showed no evidence for DVT Treated with IV vancomycin. Discharged on 01/09/2015 on Bactrim and in Unna boots, which she tolerated well. . She is ambulating per her baseline. No significant pain. No claudication or rest pain.  ABI noncompressible. Palpable DP bilaterally. She does not usually wear compression. Cannot apply compression stockings. No fever or chills. No significant drainage. Blood sugars less than 150. 01/28/2015 -- she has not yet received her appointment for a vascular studies both arterial and venous duplex. He is otherwise doing well but has noticed some redness of the right lower extremity in an area where it is no open ulcer. The left lower extremity feels better. 02/04/2015 -- she has had her vascular studies done in [place] and she goes back to see the doctor tomorrow. I have asked her to get business card to them so that they can send [place] a report. she is not very compliant with her now for dressing and if it sits down she does not use a call to come and get it fixed. Her hygiene too seems to be rather poor. 02/11/2015 -- we have got the vascular studies done at [facility] and the lower extremity arterial duplex evaluation done on 01/30/2015 revealed there was [name], Elizabeth ([id number]) [id number] Page 2 of 8 diffuse vessel wall calcification noted throughout the bilateral lower extremity arterial system there is possible arterial inflow disease. No hemodynamically significant stenosis is present. Bilateral calf arteries were not well visualized. the lower extremity venous duplex study done on 01/30/2015 revealed that the patient had no evidence of DVT on both extremities. There was deep venous reflux involving the left lower extremity. No incompetent bilateral great saphenous veins and right small saphenous vein. She was then seen by Dr. Edilia Bo on 02/05/2015. His opinion was that she had adequate arterial flow although there was some evidenc e of calcified disease. However because she had a biphasic Doppler signal in the dorsalis pedis position he did not think it was necessary to do any further arterial workup. He thought it was safe to use mild compression  stockings and elevate her legs. He has written her a prescription for knee-high compression stockings with a gradient of 15-20 mmHg and  is encouraged her to wear these. 02/18/2015 is finally got her prescription filled and has brought her stockings of 15-20 mmHg and come ready to use these. READMISSION 10/26/16; this is a 72 year old woman with type 2 diabetes and according to her a history of peripheral neuropathy. Over the last 6 weeks she has noted a wound on the plantar aspect of her left first toe. There is no obvious precipitating features. She apparently does not have a history of PAD and has had that reviewed previously by vein and vascular in [place]. Her ABI in this clinic however was 1.55. She is been going to her primary physician's office and is been getting DuoDERM which is the primary dressing. She was also on oral antibiotics although I don't think that this was cultured. He tells me she did have a history of wounds previously but is not sure where on her feet. She is wearing ordinary footwear. [facility] she had arterial studies done in March 2016. At that time her ABIs were noncompressible in both legs however Doppler waveforms were multiphasic bilaterally. I do not see that she was actually seen by the physician. She was noted to have diffuse specimen calcification throughout the bilateral lower extremity arterial system. No hemodynamically significant stenosis was present. Bilateral calf arteries were not well visualized which was most likely due to calcifications. The patient also has chronic lower extremity edema. Studies of this in March 2016 showed no evidence of a DVT As deep vein reflux involving the left lower . extremity common femoral, femoral and greater saphenous vein. There was reflux on the right involving the great and small saphenous vein I note that she was seen in this clinic previously I think this was for wounds on her legs. She was  discharged in compression which she tells me she still wears for chronic venous insufficiency and lymphedema. She also has a history of breast cancer, venous reflux and lymphedema 11/02/16; the area in question is on the plantar aspect of left great toe. This looks better this week. Still some dark areas of nonviable tissue required debridement but overall improved. 11/09/16; patient arrives today with wound generally looking better. Has been using silver alginate. 11/23/16; patient arrives today with her wound totally epithelialized. This is on the plantar aspect of her left first toe. He does not have diabetic foot where. Has a Medicare replacement policy through [employer] and I've advised her to look at the policy to see if they would cover diabetic shoes with custom inserts. 06/01/2023 Ms. Tamsin Failing is a 72 year old female with a past medical history of right-sided breast cancer with lumpectomy status post radiation in 2015. She states that about 6 months ago she developed a cyst that opened and drained. She has had trouble healing the wound under the right breast. She had a mammogram and ultrasound on 01/19/2023 for this issue and was noted to have benign postsurgical changes subadjacent to the area of concern in the right lower breast. There is no focal drainable fluid. There is no mammographic evidence of mammary malignancy. She has been using Neosporin to the wound bed. 7/31; patient presents for follow-up. She has been using mupirocin ointment and collagen to the wound bed. Wound is much smaller. Electronic Signature(s) Signed: 06/08/2023 4:29:16 PM By: Geralyn Corwin DO Entered By: Geralyn Corwin on 06/08/2023 16:25:40 -------------------------------------------------------------------------------- Physical Exam Details Patient Name: Date of Service: Elizabeth [name], [place] Elizabeth LYN L. 06/08/2023 3:45 PM Medical Record Number: [id number] Patient Account Number:  [id number] Date of Birth/Sex: Treating  RN: Mar 08, 1951 (72 y.o. Elizabeth [name] Primary Care Provider: Darreld Mclean Other Clinician: Referring Provider: Treating Provider/Extender: Gerrit Halls in Treatment: 1 Constitutional . Psychiatric . Notes Under the breast along the previous surgical incision site there is an open wound with granulation tissue at the opening. Telangiectasias to the periwound. No tunneling noted. No signs of infection. No induration or masses palpated. Electronic Signature(s) Signed: 06/08/2023 4:29:16 PM By: Jamse Arn [phone] ByGeralyn Corwin DO [id number] Page 3 of 8 Signed: 06/08/2023 4:29:16 Entered By: Geralyn Corwin on 06/08/2023 16:26:13 -------------------------------------------------------------------------------- Physician Orders Details Patient Name: Date of Service: Elizabeth [name], CA Elizabeth LYN L. 06/08/2023 3:45 PM Medical Record Number: [id number] Patient Account Number: [id number] Date of Birth/Sex: Treating RN: [birth year] (72 y.o. Elizabeth [name] Primary Care Provider: Darreld Mclean Other Clinician: Referring Provider: Treating Provider/Extender: Gerrit Halls in Treatment: 1 Verbal / Phone Orders: No Diagnosis Coding Follow-up Appointments Return Appointment in 1 week. Bathing/ Shower/ Hygiene May shower; gently cleanse wound with antibacterial soap, rinse and pat dry prior to dressing wounds Wound Treatment Wound #5 - Breast Wound Laterality: Right Cleanser: Byram Ancillary Kit - 15 Day Supply (Generic) 1 x Per Day/30 Days Discharge Instructions: Use supplies as instructed; Kit contains: (15) Saline Bullets; (15) 3x3 Gauze; 15 pr Gloves Cleanser: Soap and Water 1 x Per Day/30 Days Discharge Instructions: Gently cleanse wound with antibacterial soap, rinse and pat dry prior to dressing wounds Topical: Mupirocin Ointment 1 x Per Day/30 Days Discharge Instructions: Apply as  directed by provider. Prim Dressing: Prisma 4.34 (in) (Generic) 1 x Per Day/30 Days ary Discharge Instructions: Moisten w/normal saline or sterile water; Cover wound as directed. Do not remove from wound bed. Secondary Dressing: (BORDER) Zetuvit Plus SILICONE BORDER Dressing 4x4 (in/in) (Generic) 1 x Per Day/30 Days Discharge Instructions: Please do not put silicone bordered dressings under wraps. Use non-bordered dressing only. Electronic Signature(s) Signed: 06/08/2023 4:29:16 PM By: Geralyn Corwin DO Previous Signature: 06/08/2023 4:16:01 PM Version By: Yevonne Pax RN Entered By: Geralyn Corwin on 06/08/2023 16:28:18 -------------------------------------------------------------------------------- Problem List Details Patient Name: Date of Service: Elizabeth [name], CA Elizabeth LYN L. 06/08/2023 3:45 PM Medical Record Number: [id number] Patient Account Number: [id number] REYONNA, UFFELMAN ([id number]) [id number] Page 4 of 8 Date of Birth/Sex: Treating RN: Apr 06, 1951 (72 y.o. Elizabeth [name] Primary Care Provider: Other Clinician: Darreld Mclean Referring Provider: Treating Provider/Extender: Gerrit Halls in Treatment: 1 Active Problems ICD-10 Encounter Code Description Active Date MDM Diagnosis S21.001A Unspecified open wound of right breast, initial encounter 06/01/2023 No Yes E11.622 Type 2 diabetes mellitus with other skin ulcer 06/01/2023 No Yes Z85.3 Personal history of malignant neoplasm of breast 06/01/2023 No Yes Z92.3 Personal history of irradiation 06/01/2023 No Yes Inactive Problems Resolved Problems Electronic Signature(s) Signed: 06/08/2023 4:29:16 PM By: Geralyn Corwin DO Entered By: Geralyn Corwin on 06/08/2023 16:25:10 -------------------------------------------------------------------------------- Progress Note Details Patient Name: Date of Service: Elizabeth [name], CA Elizabeth LYN L. 06/08/2023 3:45 PM Medical Record Number:  [id number] Patient Account Number: [id number] Date of Birth/Sex: Treating RN: 1951-02-04 (72 y.o. Elizabeth [name] Primary Care Provider: Darreld Mclean Other Clinician: Referring Provider: Treating Provider/Extender: Gerrit Halls in Treatment: 1 Subjective Chief Complaint Information obtained from Patient Bilateral lower extremity phlebolymphedema, ulcerations, and cellulitis. 10/26/16. Patient is here today for reviewable wound on her left plantar first toe 06/01/2023; right breast wound History of Present Illness (HPI)  Pleasant 72 year old with past medical history significant for diabetes (hemoglobin A1c 6.1 in February 2016), chronic venous stasis disease, and breast cancer. She developed bilateral lower extremity ulcerations and cellulitis and was hospitalized at [facility] in early March 2016. Ultrasound showed no evidence for DVT Treated with IV vancomycin. Discharged on 01/09/2015 on Bactrim and in Unna boots, which she tolerated well. . She is ambulating per her baseline. No significant pain. No claudication or rest pain. ABI noncompressible. Palpable DP bilaterally. She does not usually wear compression. Cannot apply compression stockings. No fever or chills. No significant drainage. Blood sugars less than 150. 01/28/2015 -- she has not yet received her appointment for a vascular studies both arterial and venous duplex. He is otherwise doing well but has noticed some redness of the right lower extremity in an area where it is no open ulcer. The left lower extremity feels better. TYLIA, GILLIAND ([id number]) [id number] Page 5 of 8 02/04/2015 -- she has had her vascular studies done in [place] and she goes back to see the doctor tomorrow. I have asked her to get business card to them so that they can send [place] a report. she is not very compliant with her now for dressing and if it sits down she does not use a  call to come and get it fixed. Her hygiene too seems to be rather poor. 02/11/2015 -- we have got the vascular studies done at [facility] and the lower extremity arterial duplex evaluation done on 01/30/2015 revealed there was diffuse vessel wall calcification noted throughout the bilateral lower extremity arterial system there is possible arterial inflow disease. No hemodynamically significant stenosis is present. Bilateral calf arteries were not well visualized. the lower extremity venous duplex study done on 01/30/2015 revealed that the patient had no evidence of DVT on both extremities. There was deep venous reflux involving the left lower extremity. No incompetent bilateral great saphenous veins and right small saphenous vein. She was then seen by Dr. Edilia Bo on 02/05/2015. His opinion was that she had adequate arterial flow although there was some evidenc e of calcified disease. However because she had a biphasic Doppler signal in the dorsalis pedis position he did not think it was necessary to do any further arterial workup. He thought it was safe to use mild compression stockings and elevate her legs. He has written her a prescription for knee-high compression stockings with a gradient of 15-20 mmHg and is encouraged her to wear these. 02/18/2015 is finally got her prescription filled and has brought her stockings of 15-20 mmHg and come ready to use these. READMISSION 10/26/16; this is a 72 year old woman with type 2 diabetes and according to her a history of peripheral neuropathy. Over the last 6 weeks she has noted a wound on the plantar aspect of her left first toe. There is no obvious precipitating features. She apparently does not have a history of PAD and has had that reviewed previously by vein and vascular in [place]. Her ABI in this clinic however was 1.55. She is been going to her primary physician's office and is been getting DuoDERM which is the primary dressing. She was also  on oral antibiotics although I don't think that this was cultured. He tells me she did have a history of wounds previously but is not sure where on her feet. She is wearing ordinary footwear. [facility] through [facility] she had arterial studies done in March 2016. At that time her ABIs were noncompressible in both  legs however Doppler waveforms were multiphasic bilaterally. I do not see that she was actually seen by the physician. She was noted to have diffuse specimen calcification throughout the bilateral lower extremity arterial system. No hemodynamically significant stenosis was present. Bilateral calf arteries were not well visualized which was most likely due to calcifications. The patient also has chronic lower extremity edema. Studies of this in March 2016 showed no evidence of a DVT As deep vein reflux involving the left lower . extremity common femoral, femoral and greater saphenous vein. There was reflux on the right involving the great and small saphenous vein I note that she was seen in this clinic previously I think this was for wounds on her legs. She was discharged in compression which she tells me she still wears for chronic venous insufficiency and lymphedema. She also has a history of breast cancer, venous reflux and lymphedema 11/02/16; the area in question is on the plantar aspect of left great toe. This looks better this week. Still some dark areas of nonviable tissue required debridement but overall improved. 11/09/16; patient arrives today with wound generally looking better. Has been using silver alginate. 11/23/16; patient arrives today with her wound totally epithelialized. This is on the plantar aspect of her left first toe. He does not have diabetic foot where. Has a Medicare replacement policy through [employer] and I've advised her to look at the policy to see if they would cover diabetic shoes with custom inserts. 06/01/2023 Ms. Ymani Thaemert is a 72 year old female  with a past medical history of right-sided breast cancer with lumpectomy status post radiation in 2015. She states that about 6 months ago she developed a cyst that opened and drained. She has had trouble healing the wound under the right breast. She had a mammogram and ultrasound on 01/19/2023 for this issue and was noted to have benign postsurgical changes subadjacent to the area of concern in the right lower breast. There is no focal drainable fluid. There is no mammographic evidence of mammary malignancy. She has been using Neosporin to the wound bed. 7/31; patient presents for follow-up. She has been using mupirocin ointment and collagen to the wound bed. Wound is much smaller. Objective Constitutional Vitals Time Taken: 3:58 PM, Height: 63 in, Weight: 208 lbs, BMI: 36.8, Temperature: 98.5 F, Pulse: 51 bpm, Respiratory Rate: 18 breaths/min, Blood Pressure: 123/62 mmHg. General Notes: Under the breast along the previous surgical incision site there is an open wound with granulation tissue at the opening. Telangiectasias to the periwound. No tunneling noted. No signs of infection. No induration or masses palpated. Integumentary (Hair, Skin) Wound #5 status is Open. Original cause of wound was Gradually Appeared. The date acquired was: 12/09/2022. The wound has been in treatment 1 weeks. The wound is located on the Right Breast. The wound measures 0.5cm length x 0.3cm width x 0.1cm depth; 0.118cm^2 area and 0.012cm^3 volume. There is Fat Layer (Subcutaneous Tissue) exposed. There is a small amount of serosanguineous drainage noted. There is medium (34-66%) pink granulation within the wound bed. There is no necrotic tissue within the wound bed. Assessment Active Problems ICD-10 Unspecified open wound of right breast, initial encounter Type 2 diabetes mellitus with other skin ulcer Personal history of malignant neoplasm of breast Personal history of irradiation REVE, WAREING ([id number])  [id number] Page 6 of 8 Patient's wound has improved in size and appearance since last clinic visit. I suspect this area is weakened due to radiation exposure. Luckily the area is healing. I  recommended continue mupirocin and collagen. Plan Follow-up Appointments: Return Appointment in 1 week. Bathing/ Shower/ Hygiene: May shower; gently cleanse wound with antibacterial soap, rinse and pat dry prior to dressing wounds WOUND #5: - Breast Wound Laterality: Right Cleanser: Byram Ancillary Kit - 15 Day Supply (Generic) 1 x Per Day/30 Days Discharge Instructions: Use supplies as instructed; Kit contains: (15) Saline Bullets; (15) 3x3 Gauze; 15 pr Gloves Cleanser: Soap and Water 1 x Per Day/30 Days Discharge Instructions: Gently cleanse wound with antibacterial soap, rinse and pat dry prior to dressing wounds Topical: Mupirocin Ointment 1 x Per Day/30 Days Discharge Instructions: Apply as directed by provider. Prim Dressing: Prisma 4.34 (in) (Generic) 1 x Per Day/30 Days ary Discharge Instructions: Moisten w/normal saline or sterile water; Cover wound as directed. Do not remove from wound bed. Secondary Dressing: (BORDER) Zetuvit Plus SILICONE BORDER Dressing 4x4 (in/in) (Generic) 1 x Per Day/30 Days Discharge Instructions: Please do not put silicone bordered dressings under wraps. Use non-bordered dressing only. 1. Collagen and mupirocin 2. Follow-up in 1 week Electronic Signature(s) Signed: 06/08/2023 4:29:16 PM By: Geralyn Corwin DO Entered By: Geralyn Corwin on 06/08/2023 16:27:57 -------------------------------------------------------------------------------- ROS/PFSH Details Patient Name: Date of Service: Elizabeth [name], [place] Elizabeth LYN L. 06/08/2023 3:45 PM Medical Record Number: [id number] Patient Account Number: [id number] Date of Birth/Sex: Treating RN: 01/03/1951 (72 y.o. Elizabeth [name] Primary Care Provider: Darreld Mclean Other Clinician: Referring  Provider: Treating Provider/Extender: Gerrit Halls in Treatment: 1 Label Progress Note Print Version as History and Physical for this encounter Information Obtained From Patient Eyes Medical History: Negative for: Cataracts; Glaucoma; Optic Neuritis Past Medical History Notes: left eye blind - detached retina Ear/Nose/Mouth/Throat Medical History: Negative for: Chronic sinus problems/congestion; Middle ear problems Hematologic/Lymphatic Medical History: Negative for: Anemia; Hemophilia; Human Immunodeficiency Virus; Lymphedema; Sickle Cell Disease Respiratory Medical HistoryELESHIA, ZIERDEN ([id number]) [id number] Page 7 of 8 Negative for: Aspiration; Asthma; Chronic Obstructive Pulmonary Disease (COPD); Pneumothorax; Sleep Apnea; Tuberculosis Cardiovascular Medical History: Positive for: Hypertension; Peripheral Venous Disease - venous stasis Negative for: Angina; Arrhythmia; Congestive Heart Failure; Coronary Artery Disease; Deep Vein Thrombosis; Hypotension; Myocardial Infarction; Peripheral Arterial Disease; Phlebitis; Vasculitis Gastrointestinal Medical History: Negative for: Cirrhosis ; Colitis; Crohns; Hepatitis A; Hepatitis B; Hepatitis C Endocrine Medical History: Positive for: Type II Diabetes Negative for: Type I Diabetes Time with diabetes: 20 years Treated with: Oral agents Blood sugar tested every day: Yes Tested : once a day Blood sugar testing results: Breakfast: 150 Genitourinary Medical History: Negative for: End Stage Renal Disease Immunological Medical History: Negative for: Lupus Erythematosus; Raynauds; Scleroderma Integumentary (Skin) Medical History: Negative for: History of Burn; History of pressure wounds Musculoskeletal Medical History: Negative for: Gout; Rheumatoid Arthritis; Osteoarthritis; Osteomyelitis Neurologic Medical History: Positive for: Neuropathy; Seizure Disorder -  years ago Negative for: Dementia; Quadriplegia; Paraplegia Past Medical History Notes: CVA in 2000 Oncologic Medical History: Positive for: Received Radiation - breast cancer - right side Negative for: Received Chemotherapy Psychiatric Medical History: Negative for: Anorexia/bulimia; Confinement Anxiety Immunizations Pneumococcal Vaccine: Received Pneumococcal Vaccination: No Implantable Devices None Hospitalization / Surgery History Type of Hospitalization/Surgery cellulitis and UTI Family and Social History Cancer: Yes - Mother; Diabetes: Yes - Mother,Siblings; Heart Disease: No; Hereditary Spherocytosis: No; Hypertension: No; Kidney Disease: No; Lung SIDONIE, ROSANDER ([id number]) [id number] Page 8 of 8 Disease: No; Seizures: No; Stroke: No; Thyroid Problems: No; Tuberculosis: No; Never smoker; Marital Status - Married; Alcohol Use: Never; Drug Use: No History; Caffeine Use: Daily; Financial Concerns: No; Food,  Clothing or Shelter Needs: No; Support System Lacking: No; Transportation Concerns: No Electronic Signature(s) Signed: 06/08/2023 4:29:16 PM By: Geralyn Corwin DO Signed: 06/09/2023 3:39:18 PM By: Yevonne Pax RN Entered By: Geralyn Corwin on 06/08/2023 16:28:48 -------------------------------------------------------------------------------- SuperBill Details Patient Name: Date of Service: Elizabeth [name], [place] Elizabeth LYN L. 06/08/2023 Medical Record Number: [id number] Patient Account Number: [id number] Date of Birth/Sex: Treating RN: 22-Jul-1951 (72 y.o. 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 Z92.3  Personal history of irradiation Facility Procedures : CPT4 Code: [id number] Description: [id number] - WOUND CARE VISIT-LEV 2 EST PT Modifier: Quantity: 1 Physician Procedures : CPT4 Code Description Modifier [id number] - WC PHYS LEVEL 3 - EST PT ICD-10 Diagnosis Description S21.001A Unspecified open wound of right breast, initial encounter E11.622 Type 2 diabetes mellitus with other skin ulcer Z85.3 Personal history of  malignant neoplasm of breast Z92.3 Personal history of irradiation Quantity: 1 Electronic Signature(s) Signed: 06/08/2023 4:29:16 PM By: Geralyn Corwin DO Previous Signature: 06/08/2023 4:19:08 PM Version By: Yevonne Pax RN Entered By: Geralyn Corwin on 06/08/2023 16:28:10

## 2023-06-15 ENCOUNTER — Encounter: Payer: Medicare PPO | Attending: Internal Medicine | Admitting: Internal Medicine

## 2023-06-15 DIAGNOSIS — Z853 Personal history of malignant neoplasm of breast: Secondary | ICD-10-CM | POA: Insufficient documentation

## 2023-06-15 DIAGNOSIS — Z923 Personal history of irradiation: Secondary | ICD-10-CM

## 2023-06-15 DIAGNOSIS — I10 Essential (primary) hypertension: Secondary | ICD-10-CM | POA: Diagnosis not present

## 2023-06-15 DIAGNOSIS — I872 Venous insufficiency (chronic) (peripheral): Secondary | ICD-10-CM | POA: Insufficient documentation

## 2023-06-15 DIAGNOSIS — I890 Lymphedema, not elsewhere classified: Secondary | ICD-10-CM | POA: Diagnosis present

## 2023-06-15 DIAGNOSIS — G40909 Epilepsy, unspecified, not intractable, without status epilepticus: Secondary | ICD-10-CM | POA: Diagnosis not present

## 2023-06-15 DIAGNOSIS — Z833 Family history of diabetes mellitus: Secondary | ICD-10-CM | POA: Diagnosis not present

## 2023-06-15 DIAGNOSIS — E1151 Type 2 diabetes mellitus with diabetic peripheral angiopathy without gangrene: Secondary | ICD-10-CM | POA: Diagnosis not present

## 2023-06-15 DIAGNOSIS — E1142 Type 2 diabetes mellitus with diabetic polyneuropathy: Secondary | ICD-10-CM | POA: Insufficient documentation

## 2023-06-15 DIAGNOSIS — E11622 Type 2 diabetes mellitus with other skin ulcer: Secondary | ICD-10-CM | POA: Insufficient documentation

## 2023-06-15 DIAGNOSIS — S21001A Unspecified open wound of right breast, initial encounter: Secondary | ICD-10-CM

## 2023-06-23 NOTE — Progress Notes (Signed)
SOLAI, SARA ([id number]) [id number] Page 1 of 8 Visit Report for 06/15/2023 Chief Complaint Document Details Patient Name: Date of Service: Elizabeth [name], [place] [name] 06/15/2023 10:15 A M Medical Record Number: [id number] Patient Account Number: [id number] Date of Birth/Sex: Treating RN: [birth year] (72 y.o. [name] Primary Care Provider: Darreld Mclean Other Clinician: Referring Provider: Treating Provider/Extender: Gerrit Halls in Treatment: 2 Information Obtained from: Patient Chief Complaint Bilateral lower extremity phlebolymphedema, ulcerations, and cellulitis. 10/26/16. Patient is here today for reviewable wound on her left plantar first toe 06/01/2023; right breast wound Electronic Signature(s) Signed: 06/15/2023 12:18:12 PM By: Geralyn Corwin DO Entered By: Geralyn Corwin on 06/15/2023 10:31:05 -------------------------------------------------------------------------------- HPI Details Patient Name: Date of Service: Elizabeth [name], [place] [name] 06/15/2023 10:15 A M Medical Record Number: [id number] Patient Account Number: [id number] Date of Birth/Sex: Treating RN: September 30, 1951 (72 y.o. [name] Primary Care Provider: Darreld Mclean Other Clinician: Referring Provider: Treating Provider/Extender: Gerrit Halls in Treatment: 2 History of Present Illness HPI Description: Pleasant 72 year old with past medical history significant for diabetes (hemoglobin A1c 6.1 in February 2016), chronic venous stasis disease, and breast cancer. She developed bilateral lower extremity ulcerations and cellulitis and was hospitalized at [facility] in early March 2016. Ultrasound showed no evidence for DVT Treated with IV vancomycin. Discharged on 01/09/2015 on Bactrim and in Unna boots, which she tolerated well. . She is ambulating per her baseline. No significant pain. No claudication or rest pain.  ABI noncompressible. Palpable DP bilaterally. She does not usually wear compression. Cannot apply compression stockings. No fever or chills. No significant drainage. Blood sugars less than 150. 01/28/2015 -- she has not yet received her appointment for a vascular studies both arterial and venous duplex. He is otherwise doing well but has noticed some redness of the right lower extremity in an area where it is no open ulcer. The left lower extremity feels better. 02/04/2015 -- she has had her vascular studies done in [place] and she goes back to see the doctor tomorrow. I have asked her to get business card to them so that they can send [place] a report. she is not very compliant with her now for dressing and if it sits down she does not use a call to come and get it fixed. Her hygiene too seems to be rather poor. 02/11/2015 -- we have got the vascular studies done at [facility] and the lower extremity arterial duplex evaluation done on 01/30/2015 revealed there was LANYIAH, CLABORN ([id number]) [id number] Page 2 of 8 diffuse vessel wall calcification noted throughout the bilateral lower extremity arterial system there is possible arterial inflow disease. No hemodynamically significant stenosis is present. Bilateral calf arteries were not well visualized. the lower extremity venous duplex study done on 01/30/2015 revealed that the patient had no evidence of DVT on both extremities. There was deep venous reflux involving the left lower extremity. No incompetent bilateral great saphenous veins and right small saphenous vein. She was then seen by Dr. Edilia Bo on 02/05/2015. His opinion was that she had adequate arterial flow although there was some evidenc e of calcified disease. However because she had a biphasic Doppler signal in the dorsalis pedis position he did not think it was necessary to do any further arterial workup. He thought it was safe to use mild compression  stockings and elevate her legs. He has written her a prescription for knee-high compression stockings with a gradient of 15-20  mmHg and is encouraged her to wear these. 02/18/2015 is finally got her prescription filled and has brought her stockings of 15-20 mmHg and come ready to use these. READMISSION 10/26/16; this is a 72 year old woman with type 2 diabetes and according to her a history of peripheral neuropathy. Over the last 6 weeks she has noted a wound on the plantar aspect of her left first toe. There is no obvious precipitating features. She apparently does not have a history of PAD and has had that reviewed previously by vein and vascular in [place]. Her ABI in this clinic however was 1.55. She is been going to her primary physician's office and is been getting DuoDERM which is the primary dressing. She was also on oral antibiotics although I don't think that this was cultured. He tells me she did have a history of wounds previously but is not sure where on her feet. She is wearing ordinary footwear. [facility] she had arterial studies done in March 2016. At that time her ABIs were noncompressible in both legs however Doppler waveforms were multiphasic bilaterally. I do not see that she was actually seen by the physician. She was noted to have diffuse specimen calcification throughout the bilateral lower extremity arterial system. No hemodynamically significant stenosis was present. Bilateral calf arteries were not well visualized which was most likely due to calcifications. The patient also has chronic lower extremity edema. Studies of this in March 2016 showed no evidence of a DVT As deep vein reflux involving the left lower . extremity common femoral, femoral and greater saphenous vein. There was reflux on the right involving the great and small saphenous vein I note that she was seen in this clinic previously I think this was for wounds on her legs. She was  discharged in compression which she tells me she still wears for chronic venous insufficiency and lymphedema. She also has a history of breast cancer, venous reflux and lymphedema 11/02/16; the area in question is on the plantar aspect of left great toe. This looks better this week. Still some dark areas of nonviable tissue required debridement but overall improved. 11/09/16; patient arrives today with wound generally looking better. Has been using silver alginate. 11/23/16; patient arrives today with her wound totally epithelialized. This is on the plantar aspect of her left first toe. He does not have diabetic foot where. Has a Medicare replacement policy through [place] and I've advised her to look at the policy to see if they would cover diabetic shoes with custom inserts. 06/01/2023 Ms. Denai Sayson is a 72 year old female with a past medical history of right-sided breast cancer with lumpectomy status post radiation in 2015. She states that about 6 months ago she developed a cyst that opened and drained. She has had trouble healing the wound under the right breast. She had a mammogram and ultrasound on 01/19/2023 for this issue and was noted to have benign postsurgical changes subadjacent to the area of concern in the right lower breast. There is no focal drainable fluid. There is no mammographic evidence of mammary malignancy. She has been using Neosporin to the wound bed. 7/31; patient presents for follow-up. She has been using mupirocin ointment and collagen to the wound bed. Wound is much smaller. 8/7; patient presents for follow-up. She has been using antibiotic ointment with collagen to the wound bed. Wound is stable. Electronic Signature(s) Signed: 06/15/2023 12:18:12 PM By: Geralyn Corwin DO Entered By: Geralyn Corwin on 06/15/2023 10:32:08 -------------------------------------------------------------------------------- Physical Exam Details  Patient Name: Date of Service: Elizabeth [name], [place]  [name] 06/15/2023 10:15 A M Medical Record Number: [id number] Patient Account Number: [id number] Date of Birth/Sex: Treating RN: 03/14/1951 (72 y.o. [name] Primary Care Provider: Darreld Mclean Other Clinician: Referring Provider: Treating Provider/Extender: Gerrit Halls in Treatment: 2 Constitutional . Psychiatric . Notes Under the breast along the previous surgical incision site there is an open wound with fibrinous tissue at the opening. Telangiectasias to the periwound. No tunneling noted. No signs of infection. No induration or masses palpated. KAMBRIE, STOLTENBERG ([id number]) [id number] Page 3 of 8 Electronic Signature(s) Signed: 06/15/2023 12:18:12 PM By: Geralyn Corwin DO Entered By: Geralyn Corwin on 06/15/2023 10:35:18 -------------------------------------------------------------------------------- Physician Orders Details Patient Name: Date of Service: Elizabeth [name], [place] [name] 06/15/2023 10:15 A M Medical Record Number: [id number] Patient Account Number: [id number] Date of Birth/Sex: Treating RN: [birth year] (72 y.o. [name] Primary Care Provider: Darreld Mclean Other Clinician: Referring Provider: Treating Provider/Extender: Gerrit Halls in Treatment: 2 Verbal / Phone Orders: No Diagnosis Coding Follow-up Appointments Return Appointment in 2 weeks. Bathing/ Shower/ Hygiene May shower; gently cleanse wound with antibacterial soap, rinse and pat dry prior to dressing wounds Wound Treatment Wound #5 - Breast Wound Laterality: Right Cleanser: Byram Ancillary Kit - 15 Day Supply (Generic) 1 x Per Day/30 Days Discharge Instructions: Use supplies as instructed; Kit contains: (15) Saline Bullets; (15) 3x3 Gauze; 15 pr Gloves Cleanser: Soap and Water 1 x Per Day/30 Days Discharge Instructions: Gently cleanse wound with antibacterial soap, rinse and pat dry prior to dressing wounds Topical:  Mupirocin Ointment 1 x Per Day/30 Days Discharge Instructions: Apply as directed by provider. Secondary Dressing: Gauze 1 x Per Day/30 Days Discharge Instructions: As directed: dry, moistened with saline or moistened with Dakins Solution Electronic Signature(s) Signed: 06/15/2023 12:18:12 PM By: Geralyn Corwin DO Entered By: Geralyn Corwin on 06/15/2023 10:36:31 -------------------------------------------------------------------------------- Problem List Details Patient Name: Date of Service: Elizabeth [name], [place] [name] 06/15/2023 10:15 A M Medical Record Number: [id number] Patient Account Number: [id number] Date of Birth/Sex: Treating RN: January 26, 1951 (72 y.o. [name] Primary Care Provider: Darreld Mclean Other Clinician: [name] ([id number]) [id number] Page 4 of 8 Referring Provider: Treating Provider/Extender: Gerrit Halls in Treatment: 2 Active Problems ICD-10 Encounter Code Description Active Date MDM Diagnosis S21.001A Unspecified open wound of right breast, initial encounter 06/01/2023 No Yes E11.622 Type 2 diabetes mellitus with other skin ulcer 06/01/2023 No Yes Z85.3 Personal history of malignant neoplasm of breast 06/01/2023 No Yes Z92.3 Personal history of irradiation 06/01/2023 No Yes Inactive Problems Resolved Problems Electronic Signature(s) Signed: 06/15/2023 12:18:12 PM By: Geralyn Corwin DO Entered By: Geralyn Corwin on 06/15/2023 10:31:00 -------------------------------------------------------------------------------- Progress Note Details Patient Name: Date of Service: Elizabeth [name], [place] [name] 06/15/2023 10:15 A M Medical Record Number: [id number] Patient Account Number: [id number] Date of Birth/Sex: Treating RN: [birth year] (72 y.o. [name] Primary Care Provider: Darreld Mclean Other Clinician: Referring Provider: Treating Provider/Extender: Gerrit Halls in Treatment:  2 Subjective Chief Complaint Information obtained from Patient Bilateral lower extremity phlebolymphedema, ulcerations, and cellulitis. 10/26/16. Patient is here today for reviewable wound on her left plantar first toe 06/01/2023; right breast wound History of Present Illness (HPI) Pleasant 72 year old with past medical history significant for diabetes (hemoglobin A1c 6.1 in February 2016), chronic venous stasis disease, and breast cancer. She developed bilateral lower extremity ulcerations and cellulitis and  was hospitalized at [facility] in early March 2016. Ultrasound showed no evidence for DVT Treated with IV vancomycin. Discharged on 01/09/2015 on Bactrim and in Unna boots, which she tolerated well. . She is ambulating per her baseline. No significant pain. No claudication or rest pain. ABI noncompressible. Palpable DP bilaterally. She does not usually wear compression. Cannot apply compression stockings. No fever or chills. No significant drainage. Blood sugars less than 150. 01/28/2015 -- she has not yet received her appointment for a vascular studies both arterial and venous duplex. He is otherwise doing well but has noticed some redness of the right lower extremity in an area where it is no open ulcer. The left lower extremity feels better. 02/04/2015 -- she has had her vascular studies done in [place] and she goes back to see the doctor tomorrow. I have asked her to get business card to TYLLER, GRUHLKE ([id number]) [id number] Page 5 of 8 them so that they can send [place] a report. she is not very compliant with her now for dressing and if it sits down she does not use a call to come and get it fixed. Her hygiene too seems to be rather poor. 02/11/2015 -- we have got the vascular studies done at [facility] and the lower extremity arterial duplex evaluation done on 01/30/2015 revealed there was diffuse vessel wall calcification noted  throughout the bilateral lower extremity arterial system there is possible arterial inflow disease. No hemodynamically significant stenosis is present. Bilateral calf arteries were not well visualized. the lower extremity venous duplex study done on 01/30/2015 revealed that the patient had no evidence of DVT on both extremities. There was deep venous reflux involving the left lower extremity. No incompetent bilateral great saphenous veins and right small saphenous vein. She was then seen by Dr. Edilia Bo on 02/05/2015. His opinion was that she had adequate arterial flow although there was some evidenc e of calcified disease. However because she had a biphasic Doppler signal in the dorsalis pedis position he did not think it was necessary to do any further arterial workup. He thought it was safe to use mild compression stockings and elevate her legs. He has written her a prescription for knee-high compression stockings with a gradient of 15-20 mmHg and is encouraged her to wear these. 02/18/2015 is finally got her prescription filled and has brought her stockings of 15-20 mmHg and come ready to use these. READMISSION 10/26/16; this is a 72 year old woman with type 2 diabetes and according to her a history of peripheral neuropathy. Over the last 6 weeks she has noted a wound on the plantar aspect of her left first toe. There is no obvious precipitating features. She apparently does not have a history of PAD and has had that reviewed previously by vein and vascular in [place]. Her ABI in this clinic however was 1.55. She is been going to her primary physician's office and is been getting DuoDERM which is the primary dressing. She was also on oral antibiotics although I don't think that this was cultured. He tells me she did have a history of wounds previously but is not sure where on her feet. She is wearing ordinary footwear. [facility] she had arterial studies done in March  2016. At that time her ABIs were noncompressible in both legs however Doppler waveforms were multiphasic bilaterally. I do not see that she was actually seen by the physician. She was noted to have diffuse specimen calcification throughout the bilateral lower  extremity arterial system. No hemodynamically significant stenosis was present. Bilateral calf arteries were not well visualized which was most likely due to calcifications. The patient also has chronic lower extremity edema. Studies of this in March 2016 showed no evidence of a DVT As deep vein reflux involving the left lower . extremity common femoral, femoral and greater saphenous vein. There was reflux on the right involving the great and small saphenous vein I note that she was seen in this clinic previously I think this was for wounds on her legs. She was discharged in compression which she tells me she still wears for chronic venous insufficiency and lymphedema. She also has a history of breast cancer, venous reflux and lymphedema 11/02/16; the area in question is on the plantar aspect of left great toe. This looks better this week. Still some dark areas of nonviable tissue required debridement but overall improved. 11/09/16; patient arrives today with wound generally looking better. Has been using silver alginate. 11/23/16; patient arrives today with her wound totally epithelialized. This is on the plantar aspect of her left first toe. He does not have diabetic foot where. Has a Medicare replacement policy through [place] and I've advised her to look at the policy to see if they would cover diabetic shoes with custom inserts. 06/01/2023 Ms. Tiawna Gillett is a 72 year old female with a past medical history of right-sided breast cancer with lumpectomy status post radiation in 2015. She states that about 6 months ago she developed a cyst that opened and drained. She has had trouble healing the wound under the right breast. She had a  mammogram and ultrasound on 01/19/2023 for this issue and was noted to have benign postsurgical changes subadjacent to the area of concern in the right lower breast. There is no focal drainable fluid. There is no mammographic evidence of mammary malignancy. She has been using Neosporin to the wound bed. 7/31; patient presents for follow-up. She has been using mupirocin ointment and collagen to the wound bed. Wound is much smaller. 8/7; patient presents for follow-up. She has been using antibiotic ointment with collagen to the wound bed. Wound is stable. Objective Constitutional Vitals Time Taken: 10:17 AM, Height: 63 in, Weight: 208 lbs, BMI: 36.8, Temperature: 98 F, Pulse: 56 bpm, Respiratory Rate: 16 breaths/min, Blood Pressure: 107/59 mmHg. General Notes: Under the breast along the previous surgical incision site there is an open wound with fibrinous tissue at the opening. Telangiectasias to the periwound. No tunneling noted. No signs of infection. No induration or masses palpated. Integumentary (Hair, Skin) Wound #5 status is Open. Original cause of wound was Gradually Appeared. The date acquired was: 12/09/2022. The wound has been in treatment 2 weeks. The wound is located on the Right Breast. The wound measures 0.5cm length x 0.3cm width x 0.1cm depth; 0.118cm^2 area and 0.012cm^3 volume. There is Fat Layer (Subcutaneous Tissue) exposed. There is no tunneling or undermining noted. There is a small amount of serosanguineous drainage noted. There is medium (34-66%) pink granulation within the wound bed. There is no necrotic tissue within the wound bed. Assessment Active Problems ICD-10 Unspecified open wound of right breast, initial encounter Type 2 diabetes mellitus with other skin ulcer Personal history of malignant neoplasm of breast Personal history of irradiation JYAH, BONNET L ([id number]) [id number] Page 6 of 8 Patient's wound is stable, superficial with  no current concerning features. At this time I recommended stopping collagen and just using antibiotic ointment to the wound bed. I will see her back in  2 weeks. Plan Follow-up Appointments: Return Appointment in 2 weeks. Bathing/ Shower/ Hygiene: May shower; gently cleanse wound with antibacterial soap, rinse and pat dry prior to dressing wounds WOUND #5: - Breast Wound Laterality: Right Cleanser: Byram Ancillary Kit - 15 Day Supply (Generic) 1 x Per Day/30 Days Discharge Instructions: Use supplies as instructed; Kit contains: (15) Saline Bullets; (15) 3x3 Gauze; 15 pr Gloves Cleanser: Soap and Water 1 x Per Day/30 Days Discharge Instructions: Gently cleanse wound with antibacterial soap, rinse and pat dry prior to dressing wounds Topical: Mupirocin Ointment 1 x Per Day/30 Days Discharge Instructions: Apply as directed by provider. Secondary Dressing: Gauze 1 x Per Day/30 Days Discharge Instructions: As directed: dry, moistened with saline or moistened with Dakins Solution 1. Antibiotic ointment daily 2. Follow-up in 2 weeks Electronic Signature(s) Signed: 06/15/2023 12:18:12 PM By: Geralyn Corwin DO Entered By: Geralyn Corwin on 06/15/2023 10:36:13 -------------------------------------------------------------------------------- ROS/PFSH Details Patient Name: Date of Service: Elizabeth [name], [place] [name] 06/15/2023 10:15 A M Medical Record Number: [id number] Patient Account Number: [id number] Date of Birth/Sex: Treating RN: 1951/10/10 (72 y.o. [name] Primary Care Provider: Darreld Mclean Other Clinician: Referring Provider: Treating Provider/Extender: Gerrit Halls in Treatment: 2 Label Progress Note Print Version as History and Physical for this encounter Information Obtained From Patient Eyes Medical History: Negative for: Cataracts; Glaucoma; Optic Neuritis Past Medical History Notes: left eye blind - detached retina Ear/Nose/Mouth/Throat Medical  History: Negative for: Chronic sinus problems/congestion; Middle ear problems Hematologic/Lymphatic Medical History: Negative for: Anemia; Hemophilia; Human Immunodeficiency Virus; Lymphedema; Sickle Cell Disease Respiratory Medical History: Negative for: Aspiration; Asthma; Chronic Obstructive Pulmonary Disease (COPD); Pneumothorax; Sleep Apnea; Tuberculosis MYRA, PRUNTY ([id number]) [id number] Page 7 of 8 Cardiovascular Medical History: Positive for: Hypertension; Peripheral Venous Disease - venous stasis Negative for: Angina; Arrhythmia; Congestive Heart Failure; Coronary Artery Disease; Deep Vein Thrombosis; Hypotension; Myocardial Infarction; Peripheral Arterial Disease; Phlebitis; Vasculitis Gastrointestinal Medical History: Negative for: Cirrhosis ; Colitis; Crohns; Hepatitis A; Hepatitis B; Hepatitis C Endocrine Medical History: Positive for: Type II Diabetes Negative for: Type I Diabetes Time with diabetes: 20 years Treated with: Oral agents Blood sugar tested every day: Yes Tested : once a day Blood sugar testing results: Breakfast: 150 Genitourinary Medical History: Negative for: End Stage Renal Disease Immunological Medical History: Negative for: Lupus Erythematosus; Raynauds; Scleroderma Integumentary (Skin) Medical History: Negative for: History of Burn; History of pressure wounds Musculoskeletal Medical History: Negative for: Gout; Rheumatoid Arthritis; Osteoarthritis; Osteomyelitis Neurologic Medical History: Positive for: Neuropathy; Seizure Disorder - years ago Negative for: Dementia; Quadriplegia; Paraplegia Past Medical History Notes: CVA in 2000 Oncologic Medical History: Positive for: Received Radiation - breast cancer - right side Negative for: Received Chemotherapy Psychiatric Medical History: Negative for: Anorexia/bulimia; Confinement Anxiety Immunizations Pneumococcal Vaccine: Received Pneumococcal  Vaccination: No Implantable Devices None Hospitalization / Surgery History Type of Hospitalization/Surgery cellulitis and UTI Family and Social History Cancer: Yes - Mother; Diabetes: Yes - Mother,Siblings; Heart Disease: No; Hereditary Spherocytosis: No; Hypertension: No; Kidney Disease: No; Lung Disease: No; Seizures: No; Stroke: No; Thyroid Problems: No; Tuberculosis: No; Never smoker; Marital Status - Married; Alcohol Use: Never; Drug Use: No History; Caffeine Use: Daily; Financial Concerns: No; Food, Clothing or Shelter Needs: No; Support System Lacking: No; Transportation Concerns: No DEISSY, ILLES [id number]) [id number] Page 8 of 8 Electronic Signature(s) Signed: 06/15/2023 12:18:12 PM By: Geralyn Corwin DO Signed: 06/23/2023 8:04:04 AM By: Yevonne Pax RN Entered By: Geralyn Corwin on 06/15/2023 10:36:43 -------------------------------------------------------------------------------- SuperBill Details Patient Name:  Date of Service: Elizabeth [name], [place] [name] 06/15/2023 Medical Record Number: [id number] Patient Account Number: [id number] Date of Birth/Sex: Treating RN: 06/15/1951 (72 y.o. 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 Z92.3 Personal history of irradiation Facility Procedures : CPT4 Code: [id number] Description: 99213 - WOUND CARE VISIT-LEV 3 EST PT Modifier: Quantity: 1 Physician Procedures : CPT4 Code Description Modifier [id number] 99213 - WC PHYS LEVEL 3 - EST PT ICD-10 Diagnosis Description S21.001A Unspecified open wound of right breast, initial encounter E11.622 Type 2 diabetes  mellitus with other skin ulcer Z85.3 Personal history of  malignant neoplasm of breast Z92.3 Personal history of irradiation Quantity: 1 Electronic Signature(s) Signed: 06/16/2023 8:44:15 AM By: Yevonne Pax RN Signed: 06/17/2023 10:58:10 AM By: Geralyn Corwin DO Previous Signature: 06/15/2023 12:18:12 PM Version By: Geralyn Corwin DO Entered By: Yevonne Pax on 06/16/2023 08:44:15

## 2023-06-23 NOTE — Progress Notes (Signed)
Elizabeth [name], Elizabeth [name] ([id number]) [id number].pdf Page 1 of 9 Visit Report for 06/15/2023 Arrival Information Details Patient Name: Date of Service: Elizabeth [name], Trommald [name]. 06/15/2023 10:15 A M Medical Record Number: [id number] Patient Account Number: [id number] Date of Birth/Sex: Treating RN: Feb 14, 1951 (72 y.o. [name] Primary Care : Darreld Mclean Other Clinician: Referring : Treating /Extender: Gerrit Halls in Treatment: 2 Visit Information History Since Last Visit Added or deleted any medications: No Patient Arrived: Ambulatory Any new allergies or adverse reactions: No Arrival Time: 10:16 Had a fall or experienced change in No Accompanied By: self activities of daily living that may affect Transfer Assistance: None risk of falls: Patient Identification Verified: Yes Signs or symptoms of abuse/neglect since last visito No Secondary Verification Process Completed: Yes Hospitalized since last visit: No Patient Requires Transmission-Based Precautions: No Implantable device outside of the clinic excluding No Patient Has Alerts: Yes cellular tissue based products placed in the center Patient Alerts: Patient on Blood Thinner since last visit: DIABETIC Has Dressing in Place as Prescribed: Yes Pain Present Now: No Electronic Signature(s) Signed: 06/23/2023 8:04:04 AM By: Yevonne Pax RN Entered By: Yevonne Pax on 06/15/2023 10:16:45 -------------------------------------------------------------------------------- Clinic Level of Care Assessment Details Patient Name: Date of Service: Elizabeth [name], Elizabeth [name]. 06/15/2023 10:15 A M Medical Record Number: [id number] Patient Account Number: [id number] Date of Birth/Sex: Treating RN: 20-Feb-1951 (72 y.o. [name] Primary Care : Darreld Mclean Other Clinician: Referring : Treating /Extender: Gerrit Halls in Treatment:  2 Clinic Level of Care Assessment Items TOOL 4 Quantity Score X- 1 0 Use when only an EandM is performed on FOLLOW-UP visit ASSESSMENTS - Nursing Assessment / Reassessment X- 1 10 Reassessment of Co-morbidities (includes updates in patient status) X- 1 5 Reassessment of Adherence to Treatment Plan Elizabeth [name], Elizabeth [name] ([id number]) [id number] Page 2 of 9 ASSESSMENTS - Wound and Skin A ssessment / Reassessment X - Simple Wound Assessment / Reassessment - one wound 1 5 []  - 0 Complex Wound Assessment / Reassessment - multiple wounds []  - 0 Dermatologic / Skin Assessment (not related to wound area) ASSESSMENTS - Focused Assessment []  - 0 Circumferential Edema Measurements - multi extremities []  - 0 Nutritional Assessment / Counseling / Intervention []  - 0 Lower Extremity Assessment (monofilament, tuning fork, pulses) []  - 0 Peripheral Arterial Disease Assessment (using hand held doppler) ASSESSMENTS - Ostomy and/or Continence Assessment and Care []  - 0 Incontinence Assessment and Management []  - 0 Ostomy Care Assessment and Management (repouching, etc.) PROCESS - Coordination of Care X - Simple Patient / Family Education for ongoing care 1 15 []  - 0 Complex (extensive) Patient / Family Education for ongoing care []  - 0 Staff obtains Chiropractor, Records, T Results / Process Orders est []  - 0 Staff telephones HHA, Nursing Homes / Clarify orders / etc []  - 0 Routine Transfer to another Facility (non-emergent condition) []  - 0 Routine Hospital Admission (non-emergent condition) []  - 0 New Admissions / Manufacturing engineer / Ordering NPWT Apligraf, etc. , []  - 0 Emergency Hospital Admission (emergent condition) X- 1 10 Simple Discharge Coordination []  - 0 Complex (extensive) Discharge Coordination PROCESS - Special Needs []  - 0 Pediatric / Minor Patient Management []  - 0 Isolation Patient Management []  - 0 Hearing / Language / Visual special  needs []  - 0 Assessment of Community assistance (transportation, D/C planning, etc.) []  - 0 Additional assistance / Altered mentation []  - 0 Support Surface(s) Assessment (bed,  cushion, seat, etc.) INTERVENTIONS - Wound Cleansing / Measurement X - Simple Wound Cleansing - one wound 1 5 []  - 0 Complex Wound Cleansing - multiple wounds X- 1 5 Wound Imaging (photographs - any number of wounds) []  - 0 Wound Tracing (instead of photographs) X- 1 5 Simple Wound Measurement - one wound []  - 0 Complex Wound Measurement - multiple wounds INTERVENTIONS - Wound Dressings X - Small Wound Dressing one or multiple wounds 1 10 []  - 0 Medium Wound Dressing one or multiple wounds []  - 0 Large Wound Dressing one or multiple wounds X- 1 5 Application of Medications - topical []  - 0 Application of Medications - injection INTERVENTIONS - Miscellaneous []  - 0 External ear exam Elizabeth [name], Elizabeth [name] ([id number]) [id number] Page 3 of 9 []  - 0 Specimen Collection (cultures, biopsies, blood, body fluids, etc.) []  - 0 Specimen(s) / Culture(s) sent or taken to Lab for analysis []  - 0 Patient Transfer (multiple staff / Michiel Sites Lift / Similar devices) []  - 0 Simple Staple / Suture removal (25 or less) []  - 0 Complex Staple / Suture removal (26 or more) []  - 0 Hypo / Hyperglycemic Management (close monitor of Blood Glucose) []  - 0 Ankle / Brachial Index (ABI) - do not check if billed separately X- 1 5 Vital Signs Has the patient been seen at the hospital within the last three years: Yes Total Score: 80 Level Of Care: New/Established - Level 3 Electronic Signature(s) Signed: 06/23/2023 8:04:04 AM By: Yevonne Pax RN Entered By: Yevonne Pax on 06/16/2023 08:44:03 -------------------------------------------------------------------------------- Encounter Discharge Information Details Patient Name: Date of Service: Elizabeth [name], Elizabeth [name]. 06/15/2023 10:15 A M Medical Record  Number: [id number] Patient Account Number: [id number] Date of Birth/Sex: Treating RN: 1951-07-27 (72 y.o. [name] Primary Care : Darreld Mclean Other Clinician: Referring : Treating /Extender: Gerrit Halls in Treatment: 2 Encounter Discharge Information Items Discharge Condition: Stable Ambulatory Status: Ambulatory Discharge Destination: Home Transportation: Private Auto Accompanied By: self Schedule Follow-up Appointment: Yes Clinical Summary of Care: Electronic Signature(s) Signed: 06/16/2023 8:45:16 AM By: Yevonne Pax RN Entered By: Yevonne Pax on 06/16/2023 08:45:16 -------------------------------------------------------------------------------- Lower Extremity Assessment Details Patient Name: Date of Service: Elizabeth [name], Elizabeth [name]. 06/15/2023 10:15 A CATELYNN, VANDERSLUIS [id number]) [id number] Page 4 of 9 Medical Record Number: [id number] Patient Account Number: [id number] Date of Birth/Sex: Treating RN: 08-27-1951 (72 y.o. [name] Primary Care : Darreld Mclean Other Clinician: Referring : Treating /Extender: Gerrit Halls in Treatment: 2 Electronic Signature(s) Signed: 06/23/2023 8:04:04 AM By: Yevonne Pax RN Entered By: Yevonne Pax on 06/15/2023 10:20:54 -------------------------------------------------------------------------------- Multi Wound Chart Details Patient Name: Date of Service: Elizabeth [name], Elizabeth [name]. 06/15/2023 10:15 A M Medical Record Number: [id number] Patient Account Number: [id number] Date of Birth/Sex: Treating RN: [birth year] (72 y.o. [name] Primary Care : Darreld Mclean Other Clinician: Referring : Treating /Extender: Gerrit Halls in Treatment: 2 Vital Signs Height(in): 63 Pulse(bpm): 56 Weight(lbs): 208 Blood Pressure(mmHg): 107/59 Body Mass Index(BMI):  36.8 Temperature(F): 98 Respiratory Rate(breaths/min): 16 [5:Photos:] [N/A:N/A] Right Breast N/A N/A Wound Location: Gradually Appeared N/A N/A Wounding Event: Infection - not elsewhere classified N/A N/A Primary Etiology: Hypertension, Peripheral Venous N/A N/A Comorbid History: Disease, Type II Diabetes, Neuropathy, Seizure Disorder, Received Radiation 12/09/2022 N/A N/A Date Acquired: 2 N/A N/A Weeks of Treatment: Open N/A N/A Wound Status: No N/A N/A Wound Recurrence: 0.5x0.3x0.1 N/A N/A Measurements [name]  x W x D (cm) 0.118 N/A N/A A (cm) : rea 0.012 N/A N/A Volume (cm) : 78.50% N/A N/A % Reduction in Area: 78.20% N/A N/A % Reduction in Volume: Full Thickness Without Exposed N/A N/A Classification: Support Structures Small N/A N/A Exudate Amount: Serosanguineous N/A N/A Exudate Type: red, brown N/A N/A Exudate Color: Medium (34-66%) N/A N/A Granulation Amount: Pink N/A N/A Granulation Quality: None Present (0%) N/A N/A Necrotic Amount: Fat Layer (Subcutaneous Tissue): Yes N/A N/A Exposed Structures: Fascia: No Elizabeth [name], Elizabeth [name] ([id number]) [id number] Page 5 of 9 Tendon: No Muscle: No Joint: No Bone: No None N/A N/A Epithelialization: Treatment Notes Electronic Signature(s) Signed: 06/15/2023 12:18:12 PM By: Geralyn Corwin DO Entered By: Geralyn Corwin on 06/15/2023 10:37:12 -------------------------------------------------------------------------------- Multi-Disciplinary Care Plan Details Patient Name: Date of Service: Elizabeth [name], Elizabeth [name]. 06/15/2023 10:15 A M Medical Record Number: [id number] Patient Account Number: [id number] Date of Birth/Sex: Treating RN: 1951/03/13 (72 y.o. [name] Primary Care : Darreld Mclean Other Clinician: Referring : Treating /Extender: Gerrit Halls in Treatment: 2 Active Inactive Wound/Skin Impairment Nursing Diagnoses: Knowledge  deficit related to ulceration/compromised skin integrity Goals: Patient/caregiver will verbalize understanding of skin care regimen Date Initiated: 06/01/2023 Target Resolution Date: 07/02/2023 Goal Status: Active Ulcer/skin breakdown will have a volume reduction of 30% by week 4 Date Initiated: 06/01/2023 Target Resolution Date: 07/02/2023 Goal Status: Active Ulcer/skin breakdown will have a volume reduction of 50% by week 8 Date Initiated: 06/01/2023 Target Resolution Date: 08/02/2023 Goal Status: Active Ulcer/skin breakdown will have a volume reduction of 80% by week 12 Date Initiated: 06/01/2023 Target Resolution Date: 09/01/2023 Goal Status: Active Ulcer/skin breakdown will heal within 14 weeks Date Initiated: 06/01/2023 Target Resolution Date: 10/02/2023 Goal Status: Active Interventions: Assess patient/caregiver ability to obtain necessary supplies Assess patient/caregiver ability to perform ulcer/skin care regimen upon admission and as needed Assess ulceration(s) every visit Notes: Electronic Signature(s) Signed: 06/16/2023 8:44:21 AM By: Yevonne Pax RN Entered By: Yevonne Pax on 06/16/2023 08:44:21 Elizabeth [name] ([id number]) [id number] Page 6 of 9 -------------------------------------------------------------------------------- Pain Assessment Details Patient Name: Date of Service: Elizabeth [name], Elizabeth [name]. 06/15/2023 10:15 A M Medical Record Number: [id number] Patient Account Number: [id number] Date of Birth/Sex: Treating RN: 07-Dec-1950 (72 y.o. [name] Primary Care : Darreld Mclean Other Clinician: Referring : Treating /Extender: Gerrit Halls in Treatment: 2 Active Problems Location of Pain Severity and Description of Pain Patient Has Paino No Site Locations Pain Management and Medication Current Pain Management: Electronic Signature(s) Signed: 06/23/2023 8:04:04 AM By: Yevonne Pax  RN Entered By: Yevonne Pax on 06/15/2023 10:17:37 -------------------------------------------------------------------------------- Patient/Caregiver Education Details Patient Name: Date of Service: Elizabeth [name], Elizabeth [name]. 8/7/2024andnbsp10:15 A M Medical Record Number: [id number] Patient Account Number: [id number] Date of Birth/Gender: Treating RN: [birth year] (72 y.o. [name] Primary Care Physician: Darreld Mclean Other Clinician: Referring Physician: Treating Physician/Extender: Gerrit Halls in Treatment: 2 Elizabeth [name], Elizabeth [name] ([id number]) [id number] Page 7 of 9 Education Assessment Education Provided To: Patient Education Topics Provided Wound/Skin Impairment: Handouts: Caring for Your Ulcer, Other: s/s of infection Methods: Explain/Verbal Responses: State content correctly Electronic Signature(s) Signed: 06/23/2023 8:04:04 AM By: Yevonne Pax RN Entered By: Yevonne Pax on 06/15/2023 10:21:31 -------------------------------------------------------------------------------- Wound Assessment Details Patient Name: Date of Service: Elizabeth [name], Elizabeth [name]. 06/15/2023 10:15 A M Medical Record Number: [id number] Patient Account Number: [id number] Date of Birth/Sex: Treating RN: 1951-04-19 (72 y.o. F) Elizabeth [name], Elizabeth [name]  Primary Care : Darreld Mclean Other Clinician: Referring : Treating /Extender: Gerrit Halls in Treatment: 2 Wound Status Wound Number: 5 Primary Infection - not elsewhere classified Etiology: Wound Location: Right Breast Wound Open Wounding Event: Gradually Appeared Status: Date Acquired: 12/09/2022 Comorbid Hypertension, Peripheral Venous Disease, Type II Diabetes, Weeks Of Treatment: 2 History: Neuropathy, Seizure Disorder, Received Radiation Clustered Wound: No Photos Wound Measurements Length: (cm) 0.5 Width: (cm) 0.3 Depth: (cm) 0.1 Area: (cm) 0.118 Volume: (cm)  0.012 % Reduction in Area: 78.5% % Reduction in Volume: 78.2% Epithelialization: None Tunneling: No Undermining: No Wound Description Classification: Full Thickness Without Exposed Support Exudate Amount: Small Exudate Type: Serosanguineous Elizabeth [name], Elizabeth [name] ([id number]) Exudate Color: red, brown Structures Foul Odor After Cleansing: No Slough/Fibrino No [id number] Page 8 of 9 Wound Bed Granulation Amount: Medium (34-66%) Exposed Structure Granulation Quality: Pink Fascia Exposed: No Necrotic Amount: None Present (0%) Fat Layer (Subcutaneous Tissue) Exposed: Yes Tendon Exposed: No Muscle Exposed: No Joint Exposed: No Bone Exposed: No Treatment Notes Wound #5 (Breast) Wound Laterality: Right Cleanser [place]ram Ancillary Kit - 15 Day Supply Discharge Instruction: Use supplies as instructed; Kit contains: (15) Saline Bullets; (15) 3x3 Gauze; 15 pr Gloves Soap and Water Discharge Instruction: Gently cleanse wound with antibacterial soap, rinse and pat dry prior to dressing wounds Peri-Wound Care Topical Mupirocin Ointment Discharge Instruction: Apply as directed by . Primary Dressing Secondary Dressing Gauze Discharge Instruction: As directed: dry, moistened with saline or moistened with Dakins Solution Secured With Compression Wrap Compression Stockings Add-Ons Electronic Signature(s) Signed: 06/23/2023 8:04:04 AM By: Yevonne Pax RN Entered By: Yevonne Pax on 06/15/2023 10:20:39 -------------------------------------------------------------------------------- Vitals Details Patient Name: Date of Service: Elizabeth [name], Elizabeth [name]. 06/15/2023 10:15 A M Medical Record Number: [id number] Patient Account Number: [id number] Date of Birth/Sex: Treating RN: [birth year] (72 y.o. [name] Primary Care : Darreld Mclean Other Clinician: Referring : Treating /Extender: Gerrit Halls in Treatment:  2 Vital Signs Time Taken: 10:17 Temperature (F): 98 Height (in): 63 Pulse (bpm): 56 Weight (lbs): 208 Respiratory Rate (breaths/min): 16 Body Mass Index (BMI): 36.8 Blood Pressure (mmHg): 107/59 Reference Range: 80 - 120 mg / dl Elizabeth [name], Elizabeth [name] ([id number]) [id number] Page 9 of 9 Electronic Signature(s) Signed: 06/23/2023 8:04:04 AM By: Yevonne Pax RN Entered By: Yevonne Pax on 06/15/2023 10:17:31

## 2023-06-29 ENCOUNTER — Encounter (HOSPITAL_BASED_OUTPATIENT_CLINIC_OR_DEPARTMENT_OTHER): Payer: Medicare PPO | Admitting: Internal Medicine

## 2023-06-29 DIAGNOSIS — Z853 Personal history of malignant neoplasm of breast: Secondary | ICD-10-CM

## 2023-06-29 DIAGNOSIS — Z923 Personal history of irradiation: Secondary | ICD-10-CM | POA: Diagnosis not present

## 2023-06-29 DIAGNOSIS — E11622 Type 2 diabetes mellitus with other skin ulcer: Secondary | ICD-10-CM

## 2023-06-29 DIAGNOSIS — S21001A Unspecified open wound of right breast, initial encounter: Secondary | ICD-10-CM

## 2023-06-29 DIAGNOSIS — Z794 Long term (current) use of insulin: Secondary | ICD-10-CM

## 2023-07-02 ENCOUNTER — Observation Stay
Admission: EM | Admit: 2023-07-02 | Discharge: 2023-07-05 | Disposition: A | Discharge status: Home / Self Care | Payer: Medicare PPO | Attending: Internal Medicine | Admitting: Internal Medicine

## 2023-07-02 ENCOUNTER — Other Ambulatory Visit: Payer: Self-pay

## 2023-07-02 ENCOUNTER — Emergency Department: Payer: Medicare PPO

## 2023-07-02 DIAGNOSIS — Z794 Long term (current) use of insulin: Secondary | ICD-10-CM | POA: Diagnosis not present

## 2023-07-02 DIAGNOSIS — Z89421 Acquired absence of other right toe(s): Secondary | ICD-10-CM | POA: Diagnosis not present

## 2023-07-02 DIAGNOSIS — R9431 Abnormal electrocardiogram [ECG] [EKG]: Secondary | ICD-10-CM | POA: Diagnosis not present

## 2023-07-02 DIAGNOSIS — I480 Paroxysmal atrial fibrillation: Secondary | ICD-10-CM | POA: Diagnosis present

## 2023-07-02 DIAGNOSIS — E1140 Type 2 diabetes mellitus with diabetic neuropathy, unspecified: Secondary | ICD-10-CM | POA: Diagnosis not present

## 2023-07-02 DIAGNOSIS — N179 Acute kidney failure, unspecified: Secondary | ICD-10-CM | POA: Diagnosis present

## 2023-07-02 DIAGNOSIS — Z1152 Encounter for screening for COVID-19: Secondary | ICD-10-CM | POA: Diagnosis not present

## 2023-07-02 DIAGNOSIS — E785 Hyperlipidemia, unspecified: Secondary | ICD-10-CM | POA: Diagnosis present

## 2023-07-02 DIAGNOSIS — I10 Essential (primary) hypertension: Secondary | ICD-10-CM | POA: Diagnosis present

## 2023-07-02 DIAGNOSIS — E1122 Type 2 diabetes mellitus with diabetic chronic kidney disease: Secondary | ICD-10-CM | POA: Diagnosis not present

## 2023-07-02 DIAGNOSIS — Z7984 Long term (current) use of oral hypoglycemic drugs: Secondary | ICD-10-CM | POA: Diagnosis not present

## 2023-07-02 DIAGNOSIS — Z853 Personal history of malignant neoplasm of breast: Secondary | ICD-10-CM | POA: Diagnosis not present

## 2023-07-02 DIAGNOSIS — E1151 Type 2 diabetes mellitus with diabetic peripheral angiopathy without gangrene: Secondary | ICD-10-CM | POA: Diagnosis present

## 2023-07-02 DIAGNOSIS — Z23 Encounter for immunization: Secondary | ICD-10-CM | POA: Diagnosis not present

## 2023-07-02 DIAGNOSIS — R600 Localized edema: Secondary | ICD-10-CM | POA: Diagnosis present

## 2023-07-02 DIAGNOSIS — N390 Urinary tract infection, site not specified: Secondary | ICD-10-CM | POA: Diagnosis present

## 2023-07-02 DIAGNOSIS — Z89422 Acquired absence of other left toe(s): Secondary | ICD-10-CM | POA: Diagnosis not present

## 2023-07-02 DIAGNOSIS — R0602 Shortness of breath: Secondary | ICD-10-CM | POA: Diagnosis not present

## 2023-07-02 DIAGNOSIS — R55 Syncope and collapse: Principal | ICD-10-CM | POA: Diagnosis present

## 2023-07-02 DIAGNOSIS — I6522 Occlusion and stenosis of left carotid artery: Secondary | ICD-10-CM | POA: Insufficient documentation

## 2023-07-02 DIAGNOSIS — Z8673 Personal history of transient ischemic attack (TIA), and cerebral infarction without residual deficits: Secondary | ICD-10-CM | POA: Diagnosis not present

## 2023-07-02 DIAGNOSIS — M6281 Muscle weakness (generalized): Secondary | ICD-10-CM | POA: Diagnosis not present

## 2023-07-02 DIAGNOSIS — R2681 Unsteadiness on feet: Secondary | ICD-10-CM | POA: Insufficient documentation

## 2023-07-02 DIAGNOSIS — N1832 Chronic kidney disease, stage 3b: Secondary | ICD-10-CM | POA: Diagnosis not present

## 2023-07-02 DIAGNOSIS — Z7902 Long term (current) use of antithrombotics/antiplatelets: Secondary | ICD-10-CM | POA: Diagnosis not present

## 2023-07-02 DIAGNOSIS — E669 Obesity, unspecified: Secondary | ICD-10-CM | POA: Diagnosis present

## 2023-07-02 DIAGNOSIS — I639 Cerebral infarction, unspecified: Secondary | ICD-10-CM | POA: Diagnosis present

## 2023-07-02 DIAGNOSIS — F32A Depression, unspecified: Secondary | ICD-10-CM | POA: Diagnosis present

## 2023-07-02 LAB — CBC
HCT: 39.6 % (ref 36.0–46.0)
Hemoglobin: 12.7 g/dL (ref 12.0–15.0)
MCH: 28.5 pg (ref 26.0–34.0)
MCHC: 32.1 g/dL (ref 30.0–36.0)
MCV: 89 fL (ref 80.0–100.0)
Platelets: 191 10*3/uL (ref 150–400)
RBC: 4.45 MIL/uL (ref 3.87–5.11)
RDW: 13.6 % (ref 11.5–15.5)
WBC: 7.6 10*3/uL (ref 4.0–10.5)
nRBC: 0 % (ref 0.0–0.2)

## 2023-07-02 LAB — BASIC METABOLIC PANEL
Anion gap: 11 (ref 5–15)
BUN: 23 mg/dL (ref 8–23)
CO2: 23 mmol/L (ref 22–32)
Calcium: 8.7 mg/dL — ABNORMAL LOW (ref 8.9–10.3)
Chloride: 105 mmol/L (ref 98–111)
Creatinine, Ser: 1.58 mg/dL — ABNORMAL HIGH (ref 0.44–1.00)
GFR, Estimated: 35 mL/min — ABNORMAL LOW (ref >60)
Glucose, Bld: 141 mg/dL — ABNORMAL HIGH (ref 70–99)
Potassium: 4.2 mmol/L (ref 3.5–5.1)
Sodium: 139 mmol/L (ref 135–145)

## 2023-07-02 LAB — MAGNESIUM: Magnesium: 1.8 mg/dL (ref 1.7–2.4)

## 2023-07-02 LAB — RESP PANEL BY RT-PCR (RSV, FLU A&B, COVID)  RVPGX2
Influenza A by PCR: NEGATIVE
Influenza B by PCR: NEGATIVE
Resp Syncytial Virus by PCR: NEGATIVE
SARS Coronavirus 2 by RT PCR: NEGATIVE

## 2023-07-02 LAB — CBG MONITORING, ED: Glucose-Capillary: 127 mg/dL — ABNORMAL HIGH (ref 70–99)

## 2023-07-02 LAB — GLUCOSE, CAPILLARY: Glucose-Capillary: 151 mg/dL — ABNORMAL HIGH (ref 70–99)

## 2023-07-02 LAB — BRAIN NATRIURETIC PEPTIDE: B Natriuretic Peptide: 670.1 pg/mL — ABNORMAL HIGH (ref 0.0–100.0)

## 2023-07-02 MED ORDER — ENOXAPARIN SODIUM 60 MG/0.6ML IJ SOSY
0.5000 mg/kg | PREFILLED_SYRINGE | INTRAMUSCULAR | Status: DC
  Administered 2023-07-02: 47.5 mg via SUBCUTANEOUS
  Filled 2023-07-02: qty 0.6

## 2023-07-02 MED ORDER — INSULIN ASPART 100 UNIT/ML IJ SOLN: 0.0000 [IU] | Freq: Every day | INTRAMUSCULAR | Status: DC

## 2023-07-02 MED ORDER — DM-GUAIFENESIN ER 30-600 MG PO TB12: 1.0000 | ORAL_TABLET | Freq: Two times a day (BID) | ORAL | Status: DC | PRN

## 2023-07-02 MED ORDER — HYDRALAZINE HCL 20 MG/ML IJ SOLN: 5.0000 mg | INTRAMUSCULAR | Status: DC | PRN

## 2023-07-02 MED ORDER — MAGNESIUM SULFATE 2 GM/50ML IV SOLN
2.0000 g | Freq: Once | INTRAVENOUS | Status: AC
  Administered 2023-07-02: 2 g via INTRAVENOUS
  Filled 2023-07-02: qty 50

## 2023-07-02 MED ORDER — ALBUTEROL SULFATE (2.5 MG/3ML) 0.083% IN NEBU: 2.5000 mg | INHALATION_SOLUTION | RESPIRATORY_TRACT | Status: DC | PRN

## 2023-07-02 MED ORDER — LORAZEPAM 2 MG/ML IJ SOLN: 2.0000 mg | INTRAMUSCULAR | Status: DC | PRN

## 2023-07-02 MED ORDER — INSULIN ASPART 100 UNIT/ML IJ SOLN
0.0000 [IU] | Freq: Three times a day (TID) | INTRAMUSCULAR | Status: DC
  Administered 2023-07-03: 1 [IU] via SUBCUTANEOUS
  Filled 2023-07-02: qty 1

## 2023-07-02 MED ORDER — ACETAMINOPHEN 325 MG PO TABS: 650.0000 mg | ORAL_TABLET | Freq: Four times a day (QID) | ORAL | Status: DC | PRN

## 2023-07-02 MED ORDER — DIPHENHYDRAMINE HCL 50 MG/ML IJ SOLN
12.5000 mg | Freq: Three times a day (TID) | INTRAMUSCULAR | Status: DC | PRN
  Administered 2023-07-02: 12.5 mg via INTRAVENOUS
  Filled 2023-07-02: qty 1

## 2023-07-02 NOTE — Progress Notes (Signed)
Anticoagulation monitoring(Lovenox):  72 yo  female ordered Lovenox 40 mg Q24h    Filed Weights   07/02/23 2013  Weight: 94.3 kg (208 lb)   BMI 36.85    Lab Results  Component Value Date   CREATININE 1.58 (H) 07/02/2023   CREATININE 1.40 (H) 01/02/2023   CREATININE 1.04 (H) 06/09/2022   Estimated Creatinine Clearance: 35.7 mL/min (A) (by C-G formula based on SCr of 1.58 mg/dL (H)). Hemoglobin & Hematocrit     Component Value Date/Time   HGB 12.7 07/02/2023 2016   HGB 12.8 11/18/2014 1011   HCT 39.6 07/02/2023 2016   HCT 38.8 11/18/2014 1011     Per Protocol for Patient with estCrcl > 30 ml/min and BMI > 30, will transition to Lovenox 47.5 mg Q24h.

## 2023-07-02 NOTE — ED Triage Notes (Signed)
Pt to ed from home via POV for a fall that occurred earlier in the day which lead to a syncope and possible seizure like activity. Pt advised this happened around noon today. Pt is caox4, in no acute distress and ambulatory in triage.

## 2023-07-02 NOTE — ED Provider Notes (Signed)
[facility] Provider Note    Event Date/Time   First MD Initiated Contact with Patient 07/02/23 2027     (approximate)   History   Fall and Loss of Consciousness ( )   HPI Elizabeth [name] is a 72 y.o. female with DM2, prior TIA, HTN, HLD who presents today for syncope.  Patient states having some nausea symptoms earlier in the day and feeling weak but nothing otherwise remarkable.  Husband saw her walking to the bathroom when she also collapsed.  Patient does not remember the event.  Husband said she was shaking slightly afterwards but had no confusion immediately woke back up.  Patient denying chest pain, fever, congestion, abdominal pain, nausea, vomiting, constipation, diarrhea.  She has noted some worsening leg swelling recently but no leg pain.  Denies any history of heart failure.  Currently on Plavix but no other blood thinners.  No reported history of A-fib.     Physical Exam   Triage Vital Signs: ED Triage Vitals [07/02/23 2013]  Encounter Vitals Group     BP (!) 159/73     Systolic BP Percentile      Diastolic BP Percentile      Pulse Rate 67     Resp 16     Temp 98 F (36.7 C)     Temp Source Oral     SpO2 99 %     Weight 208 lb (94.3 kg)     Height 5\' 3"  (1.6 m)     Head Circumference      Peak Flow      Pain Score 0     Pain Loc      Pain Education      Exclude from Growth Chart     Most recent vital signs: Vitals:   07/02/23 2013 07/02/23 2100  BP: (!) 159/73 (!) 154/72  Pulse: 67 64  Resp: 16 19  Temp: 98 F (36.7 C)   SpO2: 99% 99%   Physical Exam: I have reviewed the vital signs and nursing notes. General: Awake, alert, no acute distress.  Nontoxic appearing. Head:  Atraumatic, normocephalic.   ENT:  EOM intact, PERRL. Oral mucosa is pink and moist with no lesions. Neck: Neck is supple with full range of motion, No meningeal signs. Cardiovascular:  RRR, No murmurs. Peripheral pulses palpable and equal  bilaterally. Respiratory:  Symmetrical chest wall expansion.  No rhonchi, rales, or wheezes.  Good air movement throughout.  No use of accessory muscles.   Musculoskeletal:  No cyanosis.lymphedema to bilateral lower extremities with 1+ pitting edema.  No tenderness to palpation throughout.  Moving extremities with full ROM Abdomen:  Soft, nontender, nondistended. Neuro:  GCS 15, moving all four extremities, interacting appropriately. Speech clear. Psych:  Calm, appropriate.   Skin:  Warm, dry, no rash.     ED Results / Procedures / Treatments   Labs (all labs ordered are listed, but only abnormal results are displayed) Labs Reviewed  BASIC METABOLIC PANEL - Abnormal; Notable for the following components:      Result Value   Glucose, Bld 141 (*)    Creatinine, Ser 1.58 (*)    Calcium 8.7 (*)    GFR, Estimated 35 (*)    All other components within normal limits  CBG MONITORING, ED - Abnormal; Notable for the following components:   Glucose-Capillary 127 (*)    All other components within normal limits  RESP PANEL BY RT-PCR (RSV, FLU A&B, COVID)  RVPGX2  CBC  URINALYSIS, ROUTINE W REFLEX MICROSCOPIC  BRAIN NATRIURETIC PEPTIDE  MAGNESIUM     EKG My EKG interpretation: Rate of 57, slow A-fib, QTc prolonged at 672.  No acute ST elevations or depressions.   RADIOLOGY See ED course for my CT interpretations.  Chest x-ray per my interpretation shows no acute pathology.   PROCEDURES:  Critical Care performed: No  Procedures   MEDICATIONS ORDERED IN ED: Medications  diphenhydrAMINE (BENADRYL) injection 12.5 mg (has no administration in time range)  hydrALAZINE (APRESOLINE) injection 5 mg (has no administration in time range)  acetaminophen (TYLENOL) tablet 650 mg (has no administration in time range)  magnesium sulfate IVPB 2 g 50 mL (2 g Intravenous New Bag/Given 07/02/23 2136)     IMPRESSION / MDM / ASSESSMENT AND PLAN / ED COURSE  I reviewed the triage vital signs  and the nursing notes.                              Differential diagnosis includes, but is not limited to, cardiac arrhythmia, orthostatic hypotension, vasovagal syncope, dehydration.  Patient's presentation is most consistent with acute presentation with potential threat to life or bodily function.  Patient is a 72 year old female presenting today for syncope with no prodromal symptoms.  Physical exam largely reassuring on arrival here.  EKG most notable for atrial fibrillation with prolonged QTc greater than 600 which could be indicative of what happened today.  Other laboratory workup and CT imaging otherwise reassuring at this time.  Patient given 2 g of magnesium.  Repeat EKG with QTc still prolonged at 575.  Given high risk syncope features with QTc prolongation, will admit patient to hospitalist for further evaluation and management.  The patient is on the cardiac monitor to evaluate for evidence of arrhythmia and/or significant heart rate changes. Clinical Course as of 07/02/23 2243  Sat Jul 02, 2023  2055 Basic metabolic panel(!) Creatinine comparable to baseline [DW]  2055 CBC Unremarkable [DW]  2117 CT HEAD WO CONTRAST Independently interpreted CT head and CT C-spine with no acute pathology. [DW]    Clinical Course User Index [DW] Janith Lima, MD     FINAL CLINICAL IMPRESSION(S) / ED DIAGNOSES   Final diagnoses:  Syncope and collapse  QT prolongation     Rx / DC Orders   ED Discharge Orders     None        Note:  This document was prepared using Dragon voice recognition software and may include unintentional dictation errors.   Janith Lima, MD 07/02/23 [phone]

## 2023-07-02 NOTE — H&P (Signed)
History and Physical    Elizabeth [name]:[id number] DOB: 1951/02/15 DOA: 07/02/2023  Referring MD/NP/PA:   PCP: Leanna Sato, MD   Patient coming from:  The patient is coming from home.     Chief Complaint: syncope  HPI: Elizabeth [name] is a 72 y.o. female with medical history significant of HTN, HLD, DM, PVD, stroke, PAF not on AC, stroke, depression, CKD-3b, psoriasis, chronic venous insufficiency, breast cancer (s/p of right lumpectomy and radiation therapy), obesity, s/p of Roux-en-Y, who presents with syncope.  Per her husband at the bedside, he saw her walking to the bathroom when she passed out for few seconds. Husband said she was shaking slightly afterwards but had no confusion immediately woke back up.  Patient denies dizziness.  No unilateral numbness or tinglings in extremities.  No facial droop or slurred speech.  Patient does not have chest pain or shortness of breath.  She has mild dry cough.  She has nausea, no vomiting, diarrhea or abdominal pain.  Patient states that she has been taking doxycycline since 8/12 for UTI, but currently no symptoms for UTI.  Patient was found to have A-fib on EKG in ED.  Patient states that 1 doctor told her that she has A-fib, another doctor told her that she does not have A-fib.  I reviewed her chart, patient had an EKG on 03/03/2021 which showed A-fib.   Data reviewed independently and ED Course: pt was found to have WBC 7.6, worsening renal function, magnesium 1.8, negative PCR for COVID, flu and RSV, temperature normal, blood pressure 154/72, heart rate 67, RR 19, oxygen saturation 99% on room air.  Chest x-ray showed bronchitis change.  CT of head and CT of C-spine negative for acute issues.  Patient is placed on telemetry bed for observation.   EKG: I have personally reviewed.  Atrial fibrillation, QTc 575, LAD, low voltage.   Review of Systems:   General: no fevers, chills, no body weight gain, has fatigue HEENT: no blurry  vision, hearing changes or sore throat Respiratory: no dyspnea, has mild coughing, no wheezing CV: no chest pain, no palpitations GI: has nausea, no vomiting, abdominal pain, diarrhea, constipation GU: no dysuria, burning on urination, increased urinary frequency, hematuria  Ext: has leg edema Neuro: no unilateral weakness, numbness, or tingling, no vision change or hearing loss. Has syncope Skin: no rash, no skin tear. MSK: No muscle spasm, no deformity, no limitation of range of movement in spin Heme: No easy bruising.  Travel history: No recent long distant travel.   Allergy: No Known Allergies  Past Medical History:  Diagnosis Date   Cancer (HCC)    breast   Carcinoma of right breast (HCC) 05/20/2014   Diabetes mellitus without complication (HCC)    Hypertension    Lymphedema    Personal history of radiation therapy 2015   Right breast   PONV (postoperative nausea and vomiting)    Psoriasis 1990   Stroke (HCC) 2000   residual left arm weakness   Varicose veins     Past Surgical History:  Procedure Laterality Date   AMPUTATION Left 03/04/2021   Procedure: AMPUTATION RAY;  Surgeon: Linus Galas, DPM;  Location: [facility];  Service: Podiatry;  Laterality: Left;   AMPUTATION TOE Right 01/25/2017   Procedure: AMPUTATION TOE   ;  Surgeon: Recardo Evangelist, DPM;  Location: [facility];  Service: Podiatry;  Laterality: Right;   BREAST EXCISIONAL BIOPSY Right 2015   [facility] / with radiation  BREAST LUMPECTOMY Right 04-2014   followed by radiation,  no chemo   BREAST REDUCTION SURGERY  1977   CHOLECYSTECTOMY N/A 09/23/2015   Procedure: LAPAROSCOPIC CHOLECYSTECTOMY;  Surgeon: Geoffry Paradise, MD;  Location: [facility];  Service: General;  Laterality: N/A;   DILATION AND CURETTAGE OF UTERUS  2010   EYE SURGERY Left 2015   cataracts and left eye detached retina repair   EYE SURGERY Right 2013   cataract with len implant   GASTRIC ROUX-EN-Y N/A 09/23/2015   Procedure: LAPAROSCOPIC ROUX-EN-Y  GASTRIC;  Surgeon: Geoffry Paradise, MD;  Location: [facility];  Service: General;  Laterality: N/A;   HIATAL HERNIA REPAIR N/A 09/23/2015   Procedure: LAPAROSCOPIC REPAIR OF HIATAL HERNIA;  Surgeon: Geoffry Paradise, MD;  Location: [facility];  Service: General;  Laterality: N/A;   IRRIGATION AND DEBRIDEMENT FOOT Right 01/25/2017   Procedure: IRRIGATION AND DEBRIDEMENT FOOT;  Surgeon: Recardo Evangelist, DPM;  Location: [facility];  Service: Podiatry;  Laterality: Right;   IRRIGATION AND DEBRIDEMENT FOOT Right 01/29/2017   Procedure: IRRIGATION AND DEBRIDEMENT FOOT and application of wound vac;  Surgeon: Recardo Evangelist, DPM;  Location: [facility];  Service: Podiatry;  Laterality: Right;   LOWER EXTREMITY ANGIOGRAPHY Right 01/28/2017   Procedure: Lower Extremity Angiography;  Surgeon: Renford Dills, MD;  Location: [facility];  Service: Cardiovascular;  Laterality: Right;   LOWER EXTREMITY ANGIOGRAPHY Left 03/06/2021   Procedure: Lower Extremity Angiography;  Surgeon: Renford Dills, MD;  Location: [facility];  Service: Cardiovascular;  Laterality: Left;   LOWER EXTREMITY INTERVENTION  01/28/2017   Procedure: Lower Extremity Intervention;  Surgeon: Renford Dills, MD;  Location: [facility];  Service: Cardiovascular;;   REDUCTION MAMMAPLASTY      Social History:  reports that she has never smoked. She has never been exposed to tobacco smoke. She has never used smokeless tobacco. She reports that she does not drink alcohol and does not use drugs.  Family History:  Family History  Problem Relation Age of Onset   Diabetes Mother    Cancer Mother    Breast cancer Mother 5   Diabetes Father    Diabetes Sister    Diabetes Brother      Prior to Admission medications   Medication Sig Start Date End Date Taking? Authorizing Provider  Calcium Carb-Cholecalciferol (CALCIUM 1000 + D PO) Take 1 tablet by mouth daily.   Yes [provider]  clopidogrel (PLAVIX) 75 MG tablet  Take 75 mg by mouth daily. 11/06/15  Yes [provider]  doxycycline (VIBRAMYCIN) 100 MG capsule Take 100 mg by mouth 2 (two) times daily. 06/20/23  Yes [provider]  furosemide (LASIX) 20 MG tablet Take 20 mg by mouth daily. 05/11/23  Yes [provider]  gabapentin (NEURONTIN) 300 MG capsule Take 300 mg by mouth daily. 06/14/23  Yes [provider]  linagliptin (TRADJENTA) 5 MG TABS tablet Take 5 mg by mouth daily.   Yes [provider]  lisinopril (ZESTRIL) 20 MG tablet Take 20 mg by mouth daily. 04/18/23  Yes [provider]  pravastatin (PRAVACHOL) 40 MG tablet Take 40 mg by mouth daily. In afternoon   Yes [provider]  acetaminophen (TYLENOL) 325 MG tablet Take 2 tablets (650 mg total) by mouth every 6 (six) hours as needed for mild pain (or Fever >/= 101). 03/07/21   Pennie Banter, DO  conjugated estrogens (PREMARIN) vaginal cream Estrogen Cream Instruction Discard  applicator Apply pea sized amount to tip of finger to urethra before bed. Wash hands well after application. Use Monday, Wednesday and Friday 01/05/23   Sondra Come, MD  escitalopram (LEXAPRO) 10 MG tablet Take 10 mg by mouth daily. Patient not taking: Reported on 07/02/2023 04/18/23   [provider]  ferrous gluconate (FERGON) 324 MG tablet Take by mouth.    [provider]  potassium chloride (KLOR-CON) 10 MEQ tablet Take 10 mEq by mouth daily. 04/18/23   [provider]  TRUE METRIX BLOOD GLUCOSE TEST test strip  10/13/18   [provider]  TRUEPLUS LANCETS 33G MISC  10/13/18   [provider]    Physical Exam: Vitals:   07/02/23 2013 07/02/23 2100 07/02/23 2347  BP: (!) 159/73 (!) 154/72 (!) 173/87  Pulse: 67 64 (!) 55  Resp: 16 19 (!) 22  Temp: 98 F (36.7 C)  98 F (36.7 C)  TempSrc: Oral  Oral  SpO2: 99% 99% 99%  Weight: 94.3 kg    Height: 5\' 3"  (1.6 m)     General: Not in acute distress HEENT:        Eyes: PERRL, EOMI, no jaundice       ENT: No discharge from the ears and nose, no pharynx injection, no tonsillar enlargement.        Neck: No JVD, no bruit, no mass felt. Heme: No neck lymph node enlargement. Cardiac: S1/S2, irregularly irregular rhythm, No murmurs, No gallops or rubs. Respiratory: No rales, wheezing, rhonchi or rubs. GI: Soft, nondistended, nontender, no rebound pain, no organomegaly, BS present. GU: No hematuria Ext: 1+ pitting leg edema and chronic venous insufficiency bilaterally. 1+DP/PT pulse bilaterally. Musculoskeletal: No joint deformities, No joint redness or warmth, no limitation of ROM in spin. Skin: No rashes.  Neuro: Alert, oriented X3, cranial nerves II-XII grossly intact, moves all extremities normally. Psych: Patient is not psychotic, no suicidal or hemocidal ideation.  Labs on Admission: I have personally reviewed following labs and imaging studies  CBC: Recent Labs  Lab 07/02/23 2016  WBC 7.6  HGB 12.7  HCT 39.6  MCV 89.0  PLT 191   Basic Metabolic Panel: Recent Labs  Lab 07/02/23 2016  NA 139  K 4.2  CL 105  CO2 23  GLUCOSE 141*  BUN 23  CREATININE 1.58*  CALCIUM 8.7*  MG 1.8   GFR: Estimated Creatinine Clearance: 35.7 mL/min (A) (by C-G formula based on SCr of 1.58 mg/dL (H)). Liver Function Tests: No results for input(s): "AST", "ALT", "ALKPHOS", "BILITOT", "PROT", "ALBUMIN" in the last 168 hours. No results for input(s): "LIPASE", "AMYLASE" in the last 168 hours. No results for input(s): "AMMONIA" in the last 168 hours. Coagulation Profile: No results for input(s): "INR", "PROTIME" in the last 168 hours. Cardiac Enzymes: No results for input(s): "CKTOTAL", "CKMB", "CKMBINDEX", "TROPONINI" in the last 168 hours. BNP (last 3 results) No results for input(s): "PROBNP" in the last 8760 hours. HbA1C: No results for input(s): "HGBA1C" in the last 72 hours. CBG: Recent Labs  Lab 07/02/23 2037 07/02/23 2347  GLUCAP 127* 151*    Lipid Profile: No results for input(s): "CHOL", "HDL", "LDLCALC", "TRIG", "CHOLHDL", "LDLDIRECT" in the last 72 hours. Thyroid Function Tests: No results for input(s): "TSH", "T4TOTAL", "FREET4", "T3FREE", "THYROIDAB" in the last 72 hours. Anemia Panel: No results for input(s): "VITAMINB12", "FOLATE", "FERRITIN", "TIBC", "IRON", "RETICCTPCT" in the last 72 hours. Urine analysis:    Component Value Date/Time   COLORURINE YELLOW (A) 07/03/2023 0001  APPEARANCEUR HAZY (A) 07/03/2023 0001   APPEARANCEUR Cloudy 01/05/2012 1249   LABSPEC 1.018 07/03/2023 0001   LABSPEC 1.027 01/05/2012 1249   PHURINE 5.0 07/03/2023 0001   GLUCOSEU NEGATIVE 07/03/2023 0001   GLUCOSEU 150 mg/dL 08/65/7846 9629   HGBUR NEGATIVE 07/03/2023 0001   BILIRUBINUR NEGATIVE 07/03/2023 0001   BILIRUBINUR Negative 01/05/2012 1249   KETONESUR 5 (A) 07/03/2023 0001   PROTEINUR 30 (A) 07/03/2023 0001   NITRITE NEGATIVE 07/03/2023 0001   LEUKOCYTESUR TRACE (A) 07/03/2023 0001   LEUKOCYTESUR Negative 01/05/2012 1249   Sepsis Labs: @LABRCNTIP (procalcitonin:4,lacticidven:4) ) Recent Results (from the past 240 hour(s))  Resp panel by RT-PCR (RSV, Flu A&B, Covid) Anterior Nasal Swab     Status: None   Collection Time: 07/02/23  8:50 PM   Specimen: Anterior Nasal Swab  Result Value Ref Range Status   SARS Coronavirus 2 by RT PCR NEGATIVE NEGATIVE Final    Comment: (NOTE) SARS-CoV-2 target nucleic acids are NOT DETECTED.  The SARS-CoV-2 RNA is generally detectable in upper respiratory specimens during the acute phase of infection. The lowest concentration of SARS-CoV-2 viral copies this assay can detect is 138 copies/mL. A negative result does not preclude SARS-Cov-2 infection and should not be used as the sole basis for treatment or other patient management decisions. A negative result may occur with  improper specimen collection/handling, submission of specimen other than nasopharyngeal swab, presence of viral  mutation(s) within the areas targeted by this assay, and inadequate number of viral copies(<138 copies/mL). A negative result must be combined with clinical observations, patient history, and epidemiological information. The expected result is Negative.  Fact Sheet for Patients:  BloggerCourse.com  Fact Sheet for Healthcare Providers:  SeriousBroker.it  This test is no t yet approved or cleared by the [place] FDA and  has been authorized for detection and/or diagnosis of SARS-CoV-2 by FDA under an Emergency Use Authorization (EUA). This EUA will remain  in effect (meaning this test can be used) for the duration of the COVID-19 declaration under Section 564(b)(1) of the Act, 21 U.S.C.section 360bbb-3(b)(1), unless the authorization is terminated  or revoked sooner.       Influenza A by PCR NEGATIVE NEGATIVE Final   Influenza B by PCR NEGATIVE NEGATIVE Final    Comment: (NOTE) The Xpert Xpress SARS-CoV-2/FLU/RSV plus assay is intended as an aid in the diagnosis of influenza from Nasopharyngeal swab specimens and should not be used as a sole basis for treatment. Nasal washings and aspirates are unacceptable for Xpert Xpress SARS-CoV-2/FLU/RSV testing.  Fact Sheet for Patients: BloggerCourse.com  Fact Sheet for Healthcare Providers: SeriousBroker.it  This test is not yet approved or cleared by the [place] FDA and has been authorized for detection and/or diagnosis of SARS-CoV-2 by FDA under an Emergency Use Authorization (EUA). This EUA will remain in effect (meaning this test can be used) for the duration of the COVID-19 declaration under Section 564(b)(1) of the Act, 21 U.S.C. section 360bbb-3(b)(1), unless the authorization is terminated or revoked.     Resp Syncytial Virus by PCR NEGATIVE NEGATIVE Final    Comment: (NOTE) Fact Sheet for  Patients: BloggerCourse.com  Fact Sheet for Healthcare Providers: SeriousBroker.it  This test is not yet approved or cleared by the [place] FDA and has been authorized for detection and/or diagnosis of SARS-CoV-2 by FDA under an Emergency Use Authorization (EUA). This EUA will remain in effect (meaning this test can be used) for the duration of the COVID-19 declaration under Section 564(b)(1) of the  Act, 21 U.S.C. section 360bbb-3(b)(1), unless the authorization is terminated or revoked.  Performed at [facility], [address] [place], [place] [address]      Radiological Exams on Admission: CT HEAD WO CONTRAST  Result Date: 07/02/2023 CLINICAL DATA:  Syncope, fall, seizure like act EXAM: CT HEAD WITHOUT CONTRAST CT CERVICAL SPINE WITHOUT CONTRAST TECHNIQUE: Multidetector CT imaging of the head and cervical spine was performed following the standard protocol without intravenous contrast. Multiplanar CT image reconstructions of the cervical spine were also generated. RADIATION DOSE REDUCTION: This exam was performed according to the departmental dose-optimization program which includes automated exposure control, adjustment of the mA and/or kV according to patient size and/or use of iterative reconstruction technique. COMPARISON:  04/26/2021 CT head, no prior CT cervical spine FINDINGS: CT HEAD FINDINGS Brain: No evidence of acute infarct, hemorrhage, mass, mass effect, or midline shift. No hydrocephalus or extra-axial fluid collection. Redemonstrated encephalomalacia in the right frontal lobe and right occipital lobe, likely sequela of remote infarcts. Vascular: No hyperdense vessel. Atherosclerotic calcifications in the intracranial carotid and vertebral arteries. Skull: Negative for fracture or focal lesion. Sinuses/Orbits: Mucosal thickening in the ethmoid air cells. Status post bilateral lens replacements. Other: The  mastoid air cells are well aerated. CT CERVICAL SPINE FINDINGS Alignment: No traumatic listhesis. Straightening and mild reversal of the normal cervical lordosis. Skull base and vertebrae: No acute fracture or suspicious osseous lesion. Fusion of C2 and C3, likely congenital. Soft tissues and spinal canal: No prevertebral fluid or swelling. No visible canal hematoma. Disc levels: Degenerative changes in the cervical spine. No significant spinal canal stenosis. Upper chest: Negative. IMPRESSION: 1. No acute intracranial process. 2. No acute fracture or traumatic listhesis in the cervical spine. Electronically Signed   By: Wiliam Ke M.D.   On: 07/02/2023 21:11   CT Cervical Spine Wo Contrast  Result Date: 07/02/2023 CLINICAL DATA:  Syncope, fall, seizure like act EXAM: CT HEAD WITHOUT CONTRAST CT CERVICAL SPINE WITHOUT CONTRAST TECHNIQUE: Multidetector CT imaging of the head and cervical spine was performed following the standard protocol without intravenous contrast. Multiplanar CT image reconstructions of the cervical spine were also generated. RADIATION DOSE REDUCTION: This exam was performed according to the departmental dose-optimization program which includes automated exposure control, adjustment of the mA and/or kV according to patient size and/or use of iterative reconstruction technique. COMPARISON:  04/26/2021 CT head, no prior CT cervical spine FINDINGS: CT HEAD FINDINGS Brain: No evidence of acute infarct, hemorrhage, mass, mass effect, or midline shift. No hydrocephalus or extra-axial fluid collection. Redemonstrated encephalomalacia in the right frontal lobe and right occipital lobe, likely sequela of remote infarcts. Vascular: No hyperdense vessel. Atherosclerotic calcifications in the intracranial carotid and vertebral arteries. Skull: Negative for fracture or focal lesion. Sinuses/Orbits: Mucosal thickening in the ethmoid air cells. Status post bilateral lens replacements. Other: The mastoid  air cells are well aerated. CT CERVICAL SPINE FINDINGS Alignment: No traumatic listhesis. Straightening and mild reversal of the normal cervical lordosis. Skull base and vertebrae: No acute fracture or suspicious osseous lesion. Fusion of C2 and C3, likely congenital. Soft tissues and spinal canal: No prevertebral fluid or swelling. No visible canal hematoma. Disc levels: Degenerative changes in the cervical spine. No significant spinal canal stenosis. Upper chest: Negative. IMPRESSION: 1. No acute intracranial process. 2. No acute fracture or traumatic listhesis in the cervical spine. Electronically Signed   By: Wiliam Ke M.D.   On: 07/02/2023 21:11   DG Chest Portable 1 View  Result  Date: 07/02/2023 CLINICAL DATA:  Shortness of breath with cough and congestion EXAM: PORTABLE CHEST 1 VIEW COMPARISON:  06/05/2022 FINDINGS: Upper normal heart size. Mildly indistinct pulmonary vasculature, pulmonary venous hypertension is not excluded. Airway thickening is present, suggesting bronchitis or reactive airways disease. No airspace opacity identified. No blunting of the costophrenic angles. No significant bony abnormality is observed. IMPRESSION: 1. Airway thickening is present, suggesting bronchitis or reactive airways disease. 2. Mildly indistinct pulmonary vasculature, pulmonary venous hypertension is not excluded. Upper normal heart size. Electronically Signed   By: Gaylyn Rong M.D.   On: 07/02/2023 20:58      Assessment/Plan Principal Problem:   Syncope Active Problems:   Prolonged QT interval   PAF (paroxysmal atrial fibrillation) (HCC)   Stroke (HCC)   Acute renal failure superimposed on stage 3b chronic kidney disease (HCC)   HLD (hyperlipidemia)   Type 2 diabetes mellitus with peripheral vascular disease (HCC)   Leg edema   Essential hypertension   UTI (urinary tract infection)   Depression   Obesity (BMI 30-39.9)   Assessment and Plan:  Syncope:  Etiology is not clear.  CT  head negative. The differential diagnosis is broad, including vasovagal syncope, seizure, TIA/stroke, arrhythmia/A fib, orthostatic status.  - Place in tele bed for obs - Orthostatic vital signs  - MRI-brain  - EEG - 2d echo - PT/OT eval and treat  Prolonged QT interval: QTc 575.  Patient received 2 g of magnesium sulfate in ED.  Her magnesium is 1.8 -Hold Lexapro -Avoid using Zofran  PAF (paroxysmal atrial fibrillation) (HCC): Heart rate 67.  Patient is not taking anticoagulants currently.  CHADS2 score is 6. -Telemetry monitoring -Check TSH level - may need to discuss with her cardiologist for possibly starting anticoagulants.  History of Stroke [facility]) -Plavix, pravastatin  Acute renal failure superimposed on stage 3b chronic kidney disease (HCC): Recent baseline creatinine 1.04 on 06/10/2023.  Her creatinine is 1.58, BUN 23, GFR 35 -Hold Lasix and lisinopril -Follow-up with BMP -Will not give IV fluid due to elevated BNP 670  HLD (hyperlipidemia) -Pravastatin  Type 2 diabetes mellitus with peripheral vascular disease (HCC): Recent A1c 6.4, well-controlled.  Patient is taking Tradjenta at home -Sliding scale insulin  Leg edema: Patient has 1+ leg edema bilaterally with chronic venous insufficiency change.  2D echo on 04/27/2021 showed EF 60-85%.  Patient has elevated BNP 670, but no shortness of breath, does not seem to have acute CHF. -Follow-up 2D echo which is for syncope -Hold Lasix due to worsening renal function  Essential hypertension -IV hydralazine. -Hold lisinopril due to worsening renal function  UTI (urinary tract infection): Has been taking doxycycline since 8/12.  Urinalysis showed hazy appearance, trace amount of leukocyte, rare bacteria, WBC 0-5.  Patient does not have symptoms of UTI currently. -Continue doxycycline -Follow-up urine culture  Depression -Hold Lexapro due to prolonged QTc 575  Obesity (BMI 30-39.9): Body weight 94.3 kg, GFR  36.85 -Encourage losing weight -Exercise and healthy diet     DVT ppx: SQ Lovenox  Code Status: Full code    Family Communication:  Yes, patient's husband  at bed side.   Disposition Plan:  Anticipate discharge back to previous environment  Consults called:  none  Admission status and Level of care: Telemetry Medical:    for obs    Dispo: The patient is from: Home              Anticipated d/c is to: Home  Anticipated d/c date is: 1 day              Patient currently is not medically stable to d/c.    Severity of Illness:  The appropriate patient status for this patient is OBSERVATION. Observation status is judged to be reasonable and necessary in order to provide the required intensity of service to ensure the patient's safety. The patient's presenting symptoms, physical exam findings, and initial radiographic and laboratory data in the context of their medical condition is felt to place them at decreased risk for further clinical deterioration. Furthermore, it is anticipated that the patient will be medically stable for discharge from the hospital within 2 midnights of admission.        Date of Service 07/03/2023    Lorretta Harp [facility]   If 7PM-7AM, please contact night-coverage [email or web address] 07/03/2023, 12:45 AM

## 2023-07-03 ENCOUNTER — Observation Stay: Payer: Medicare PPO

## 2023-07-03 ENCOUNTER — Other Ambulatory Visit: Payer: Self-pay

## 2023-07-03 ENCOUNTER — Observation Stay (HOSPITAL_BASED_OUTPATIENT_CLINIC_OR_DEPARTMENT_OTHER)
Admit: 2023-07-03 | Discharge: 2023-07-03 | Disposition: A | Discharge status: Home / Self Care | Payer: Medicare PPO | Attending: Internal Medicine | Admitting: Internal Medicine

## 2023-07-03 DIAGNOSIS — I4891 Unspecified atrial fibrillation: Secondary | ICD-10-CM

## 2023-07-03 DIAGNOSIS — I6389 Other cerebral infarction: Secondary | ICD-10-CM | POA: Diagnosis not present

## 2023-07-03 DIAGNOSIS — R55 Syncope and collapse: Secondary | ICD-10-CM

## 2023-07-03 DIAGNOSIS — I480 Paroxysmal atrial fibrillation: Secondary | ICD-10-CM

## 2023-07-03 DIAGNOSIS — I10 Essential (primary) hypertension: Secondary | ICD-10-CM

## 2023-07-03 DIAGNOSIS — R9431 Abnormal electrocardiogram [ECG] [EKG]: Secondary | ICD-10-CM | POA: Diagnosis not present

## 2023-07-03 DIAGNOSIS — I639 Cerebral infarction, unspecified: Secondary | ICD-10-CM | POA: Diagnosis not present

## 2023-07-03 DIAGNOSIS — E1151 Type 2 diabetes mellitus with diabetic peripheral angiopathy without gangrene: Secondary | ICD-10-CM

## 2023-07-03 DIAGNOSIS — F341 Dysthymic disorder: Secondary | ICD-10-CM

## 2023-07-03 LAB — TSH: TSH: 0.847 u[IU]/mL (ref 0.350–4.500)

## 2023-07-03 LAB — CBC
HCT: 38.2 % (ref 36.0–46.0)
Hemoglobin: 12.5 g/dL (ref 12.0–15.0)
MCH: 28.9 pg (ref 26.0–34.0)
MCHC: 32.7 g/dL (ref 30.0–36.0)
MCV: 88.2 fL (ref 80.0–100.0)
Platelets: 208 10*3/uL (ref 150–400)
RBC: 4.33 MIL/uL (ref 3.87–5.11)
RDW: 13.5 % (ref 11.5–15.5)
WBC: 9 10*3/uL (ref 4.0–10.5)
nRBC: 0 % (ref 0.0–0.2)

## 2023-07-03 LAB — URINALYSIS, W/ REFLEX TO CULTURE (INFECTION SUSPECTED)
Bilirubin Urine: NEGATIVE
Glucose, UA: NEGATIVE mg/dL
Hgb urine dipstick: NEGATIVE
Ketones, ur: 5 mg/dL — AB
Nitrite: NEGATIVE
Protein, ur: 30 mg/dL — AB
Specific Gravity, Urine: 1.018 (ref 1.005–1.030)
pH: 5 (ref 5.0–8.0)

## 2023-07-03 LAB — BASIC METABOLIC PANEL
Anion gap: 8 (ref 5–15)
BUN: 22 mg/dL (ref 8–23)
CO2: 23 mmol/L (ref 22–32)
Calcium: 8.6 mg/dL — ABNORMAL LOW (ref 8.9–10.3)
Chloride: 106 mmol/L (ref 98–111)
Creatinine, Ser: 1.47 mg/dL — ABNORMAL HIGH (ref 0.44–1.00)
GFR, Estimated: 38 mL/min — ABNORMAL LOW (ref >60)
Glucose, Bld: 128 mg/dL — ABNORMAL HIGH (ref 70–99)
Potassium: 3.6 mmol/L (ref 3.5–5.1)
Sodium: 137 mmol/L (ref 135–145)

## 2023-07-03 LAB — LIPID PANEL
Cholesterol: 84 mg/dL (ref 0–200)
HDL: 43 mg/dL (ref >40)
LDL Cholesterol: 28 mg/dL (ref 0–99)
Total CHOL/HDL Ratio: 2 RATIO
Triglycerides: 67 mg/dL (ref <150)
VLDL: 13 mg/dL (ref 0–40)

## 2023-07-03 LAB — HEMOGLOBIN A1C
Hgb A1c MFr Bld: 6.5 % — ABNORMAL HIGH (ref 4.8–5.6)
Mean Plasma Glucose: 139.85 mg/dL

## 2023-07-03 LAB — ECHOCARDIOGRAM COMPLETE
AR max vel: 1.85 cm2
AV Peak grad: 6.8 mmHg
Ao pk vel: 1.3 m/s
Area-P 1/2: 3.66 cm2
Calc EF: 60.6 %
Height: 63 in
S' Lateral: 3.4 cm
Single Plane A2C EF: 62.4 %
Single Plane A4C EF: 57.6 %
Weight: 3368.63 oz

## 2023-07-03 LAB — GLUCOSE, CAPILLARY
Glucose-Capillary: 91 mg/dL (ref 70–99)
Glucose-Capillary: 146 mg/dL — ABNORMAL HIGH (ref 70–99)
Glucose-Capillary: 87 mg/dL (ref 70–99)
Glucose-Capillary: 198 mg/dL — ABNORMAL HIGH (ref 70–99)

## 2023-07-03 MED ORDER — DIAZEPAM 5 MG/ML IJ SOLN
5.0000 mg | Freq: Once | INTRAMUSCULAR | Status: AC
  Administered 2023-07-03: 5 mg via INTRAVENOUS
  Filled 2023-07-03: qty 2

## 2023-07-03 MED ORDER — GABAPENTIN 300 MG PO CAPS
300.0000 mg | ORAL_CAPSULE | Freq: Every day | ORAL | Status: DC
  Administered 2023-07-03 – 2023-07-04 (×2): 300 mg via ORAL
  Filled 2023-07-03 (×2): qty 1

## 2023-07-03 MED ORDER — CLOPIDOGREL BISULFATE 75 MG PO TABS
75.0000 mg | ORAL_TABLET | Freq: Every day | ORAL | Status: DC
  Administered 2023-07-03: 75 mg via ORAL
  Filled 2023-07-03: qty 1

## 2023-07-03 MED ORDER — OYSTER SHELL CALCIUM/D3 500-5 MG-MCG PO TABS
1.0000 | ORAL_TABLET | Freq: Every day | ORAL | Status: DC
  Administered 2023-07-03 – 2023-07-04 (×2): 1 via ORAL
  Filled 2023-07-03 (×2): qty 1

## 2023-07-03 MED ORDER — PERFLUTREN LIPID MICROSPHERE
1.0000 mL | INTRAVENOUS | Status: AC | PRN
  Administered 2023-07-03: 3.5 mL via INTRAVENOUS

## 2023-07-03 MED ORDER — APIXABAN 5 MG PO TABS
5.0000 mg | ORAL_TABLET | Freq: Two times a day (BID) | ORAL | Status: DC
  Administered 2023-07-03 – 2023-07-04 (×3): 5 mg via ORAL
  Filled 2023-07-03 (×3): qty 1

## 2023-07-03 MED ORDER — DOXYCYCLINE HYCLATE 100 MG PO TABS
100.0000 mg | ORAL_TABLET | Freq: Two times a day (BID) | ORAL | Status: DC
  Administered 2023-07-03 (×2): 100 mg via ORAL
  Filled 2023-07-03 (×2): qty 1

## 2023-07-03 MED ORDER — PRAVASTATIN SODIUM 20 MG PO TABS
40.0000 mg | ORAL_TABLET | Freq: Every day | ORAL | Status: DC
  Administered 2023-07-03 – 2023-07-04 (×2): 40 mg via ORAL
  Filled 2023-07-03 (×2): qty 2

## 2023-07-03 NOTE — Assessment & Plan Note (Signed)
Improved on repeat EKG today. -Continue to monitor

## 2023-07-03 NOTE — Progress Notes (Signed)
ANTICOAGULATION CONSULT NOTE  Pharmacy Consult for apixaban Indication: atrial fibrillation  No Known Allergies  Patient Measurements: Height: 5\' 3"  (160 cm) Weight: 95.5 kg (210 lb 8.6 oz) IBW/kg (Calculated) : 52.4  Vital Signs: Temp: 98.5 F (36.9 C) (08/25 1210) Temp Source: Oral (08/25 0756) BP: 131/69 (08/25 1210) Pulse Rate: 51 (08/25 1210)  Labs: Recent Labs    07/02/23 2016 07/03/23 0443  HGB 12.7 12.5  HCT 39.6 38.2  PLT 191 208  CREATININE 1.58* 1.47*    Estimated Creatinine Clearance: 38.6 mL/min (A) (by C-G formula based on SCr of 1.47 mg/dL (H)).   Medical History: Past Medical History:  Diagnosis Date   Cancer [facility])    breast   Carcinoma of right breast (HCC) 05/20/2014   Diabetes mellitus without complication (HCC)    Hypertension    Lymphedema    Personal history of radiation therapy 2015   Right breast   PONV (postoperative nausea and vomiting)    Psoriasis 1990   Stroke (HCC) 2000   residual left arm weakness   Varicose veins     Medications:  Scheduled:   calcium-vitamin D  1 tablet Oral Daily   clopidogrel  75 mg Oral Daily   enoxaparin (LOVENOX) injection  0.5 mg/kg Subcutaneous Q24H   gabapentin  300 mg Oral Daily   insulin aspart  0-5 Units Subcutaneous QHS   insulin aspart  0-9 Units Subcutaneous TID WC   pravastatin  40 mg Oral q1600    Assessment: 72 y.o. female with medical history significant of HTN, HLD, DM, PVD, stroke, PAF not on AC, stroke, depression, CKD-3b, psoriasis, chronic venous insufficiency, breast cancer (s/p of right lumpectomy and radiation therapy), obesity, s/p of Roux-en-Y, who presents with syncope.   Goal of Therapy:  Monitor platelets by anticoagulation protocol: Yes   Plan:  ---start apixaban 5 mg po BID ---meets 0/3 dose reduction criteria (serum creatinine less than 1.5 mg/dL, age < 80 years, weight > 60 kg) ---CBC and serum creatinine at least once weekly  Lowella Bandy 07/03/2023,2:08  PM

## 2023-07-03 NOTE — Progress Notes (Signed)
Mobility Specialist - Progress Note   07/03/23 0900  Mobility  Activity Ambulated with assistance in hallway;Stood at bedside;Dangled on edge of bed  Level of Assistance Standby assist, set-up cues, supervision of patient - no hands on  Assistive Device Front wheel walker  Distance Ambulated (ft) 100 ft  Activity Response Tolerated well  Mobility Referral Yes  $Mobility charge 1 Mobility  Mobility Specialist Start Time (ACUTE ONLY) 0840  Mobility Specialist Stop Time (ACUTE ONLY) 0853  Mobility Specialist Time Calculation (min) (ACUTE ONLY) 13 min   Pt supine in bed on RA upon arrival. Pt completes bed mobility Supervision with no physical assistance but extra time. Pt ambulates in hallway CGA-SBA with no LOB noted. Pt returns to bed with needs in reach and bed alarm activated.   Terrilyn Saver  Mobility Specialist  07/03/23 9:03 AM

## 2023-07-03 NOTE — Evaluation (Signed)
Occupational Therapy Evaluation Patient Details Name: Elizabeth [name] MRN: [id number] DOB: [birth year] Today's Date: 07/03/2023   History of Present Illness Pt is a 72 year old female presenting with syncope, work up ongoing    Pmh significant for HTN, HLD, DM, PVD, stroke, PAF not on AC, stroke, depression, CKD-3b, psoriasis, chronic venous insufficiency, breast cancer (s/p of right lumpectomy and radiation therapy), obesity, s/p of Roux-en-Y   Clinical Impression   Chart reviewed, pt greeted in chair with husband present throughout. Pt is alert and oriented self, place, situation, and to August 2024, not to the 25th, agreeable to OT evaluation. PTA pt is generally MOD I with ADL, has assist for IADL from family. Pt amb with PRN use of SPC (husband reports approx 1x per week). Pt presents with deficits in activity tolerance, strength, balance affecting safe and optimal ADL completion. Recommend acute OT to address deficits and to facilitate return to PLOF.      If plan is discharge home, recommend the following: A little help with bathing/dressing/bathroom;Assist for transportation;Help with stairs or ramp for entrance    Functional Status Assessment  Patient has had a recent decline in their functional status and demonstrates the ability to make significant improvements in function in a reasonable and predictable amount of time.  Equipment Recommendations  None recommended by OT    Recommendations for Other Services       Precautions / Restrictions Precautions Precautions: Fall Restrictions Weight Bearing Restrictions: No      Mobility Bed Mobility               General bed mobility comments: NT in chair pre/post session    Transfers Overall transfer level: Needs assistance Equipment used: Rolling walker (2 wheels)   Sit to Stand: Supervision, Contact guard assist                  Balance Overall balance assessment: Needs assistance Sitting-balance  support: Single extremity supported, Feet supported Sitting balance-Leahy Scale: Good     Standing balance support: Bilateral upper extremity supported, During functional activity, Reliant on assistive device for balance Standing balance-Leahy Scale: Good                             ADL either performed or assessed with clinical judgement   ADL Overall ADL's : Needs assistance/impaired Eating/Feeding: Set up;Sitting   Grooming: Wash/dry hands;Standing;Contact guard assist Grooming Details (indicate cue type and reason): sink level with RW, frequent vcs for RW use             Lower Body Dressing: Maximal assistance Lower Body Dressing Details (indicate cue type and reason): socks Toilet Transfer: Supervision/safety;Rolling walker (2 wheels);Regular Toilet   Toileting- Clothing Manipulation and Hygiene: Supervision/safety;Sitting/lateral lean       Functional mobility during ADLs: Supervision/safety;Contact guard assist;Rolling walker (2 wheels) (approx 15' 2 attempts) General ADL Comments: frequent vcs throughout for RW technique     Vision Patient Visual Report: No change from baseline       Perception         Praxis         Pertinent Vitals/Pain Pain Assessment Pain Assessment: No/denies pain     Extremity/Trunk Assessment Upper Extremity Assessment Upper Extremity Assessment: Overall WFL for tasks assessed   Lower Extremity Assessment Lower Extremity Assessment: Generalized weakness       Communication Communication Communication: Difficulty following commands/understanding Following commands: Follows one step commands with increased  time Cueing Techniques: Verbal cues;Tactile cues   Cognition Arousal: Alert Behavior During Therapy: WFL for tasks assessed/performed Overall Cognitive Status: Within Functional Limits for tasks assessed                                       General Comments  edema noted throughout BLE,  socks removed while in chair    Exercises Other Exercises Other Exercises: edu WU:XLKG of OT, role of rehab, safe ADL completion   Shoulder Instructions      Home Living Family/patient expects to be discharged to:: Private residence Living Arrangements: Spouse/significant other Available Help at Discharge: Family;Available 24 hours/day Type of Home: House Home Access: Stairs to enter [employer] of Steps: 3 Entrance Stairs-Rails: Right;Left;Can reach both Home Layout: One level     Bathroom Shower/Tub: Chief Strategy Officer: Standard Bathroom Accessibility: Yes   Home Equipment: Agricultural consultant (2 wheels);BSC/3in1;Cane - single point   Additional Comments: pt uses bsc next to bed      Prior Functioning/Environment Prior Level of Function : Independent/Modified Independent;Needs assist       Physical Assist : ADLs (physical)     Mobility Comments: SPC PRN 1x per week ADLs Comments: supervision for bathing, stands in the shower;feeding, grooming with MOD I, MOD I for dressing, MOD I for toileting; pt manages meds, does not drive/cook but will clean        OT Problem List: Decreased activity tolerance;Decreased knowledge of use of DME or AE;Decreased safety awareness      OT Treatment/Interventions: Self-care/ADL training;Therapeutic exercise;Patient/family education;Therapeutic activities;DME and/or AE instruction    OT Goals(Current goals can be found in the care plan section) Acute Rehab OT Goals Patient Stated Goal: continue to mobilize OT Goal Formulation: With patient Time For Goal Achievement: 07/17/23 Potential to Achieve Goals: Good ADL Goals Pt Will Perform Grooming: with modified independence Pt Will Perform Lower Body Dressing: with modified independence;sit to/from stand Pt Will Transfer to Toilet: with modified independence;ambulating  OT Frequency: Min 1X/week    Co-evaluation              AM-PAC OT "6 Clicks" Daily  Activity     Outcome Measure Help from another person eating meals?: None Help from another person taking care of personal grooming?: None Help from another person toileting, which includes using toliet, bedpan, or urinal?: None Help from another person bathing (including washing, rinsing, drying)?: A Little Help from another person to put on and taking off regular upper body clothing?: None Help from another person to put on and taking off regular lower body clothing?: A Lot 6 Click Score: 21   End of Session Equipment Utilized During Treatment: Rolling walker (2 wheels) Nurse Communication: Mobility status (edema in BLE)  Activity Tolerance: Patient tolerated treatment well Patient left: in chair;with call bell/phone within reach;with chair alarm set;with family/visitor present  OT Visit Diagnosis: Other abnormalities of gait and mobility (R26.89);Muscle weakness (generalized) (M62.81)                Time: 4010-2725 OT Time Calculation (min): 17 min Charges:  OT General Charges $OT Visit: 1 Visit OT Evaluation $OT Eval Low Complexity: 1 Low Oleta Mouse, OTD OTR/L  07/03/23, 12:24 PM

## 2023-07-03 NOTE — Assessment & Plan Note (Addendum)
Initial CT head was negative but MRI brain with a small cerebellar stroke.  No deficit Neurology was consulted to complete the stroke workup MRA of brain and carotid ultrasound ordered PT/OT are recommending home health Echocardiogram pending Patient is already on Plavix at home, already started on Eliquis, needed discussion with cardiologist whether to continue antiplatelet at this time or not

## 2023-07-03 NOTE — Hospital Course (Addendum)
Taken from H&P.   Elizabeth [name] is a 72 y.o. female with medical history significant of HTN, HLD, DM, PVD, stroke, PAF not on AC, stroke, depression, CKD-3b, psoriasis, chronic venous insufficiency, breast cancer (s/p of right lumpectomy and radiation therapy), obesity, s/p of Roux-en-Y, who presents with syncope.  Patient was walking to the bathroom when she passed out for few seconds, husband noticed some shaking but no confusion immediately afterwards.  Patient denies any dizziness.  No focal deficit.  She was recently treated for UTI with doxycycline.  EKG done in the ED with A-fib.  No prior diagnosis but patient had another EKG on 03/03/21 which shows A-fib.  Prolonged QTc at 575. Heart rate controlled, COVID, flu and RSV negative.  Worsening renal function. Chest x-ray showed bronchitic change. CT head and C-spine negative for any acute abnormality.  Patient was given magnesium replacement for magnesium of 1.8 with concern of prolonged QTc.  8/25: Vital stable, TSH normal, creatinine with some improvement 1.47, likely her new baseline as it was 1.35-month ago.UA with trace leukocytes and rare bacteria.  BNP elevated at 670. Repeat EKG remained in rate controlled atrial fibrillation, MRI came back positive for a small cerebellar stroke, less likely the cause of the syncopal episode. Neurology ordered MRA of head and carotid ultrasound to Complete the workup. Echocardiogram Cardiology and neurology was consulted. Starting her on Eliquis as CHA2DS2-VASc of 6, patient is already on Plavix at home and need to have a discussion with her cardiologist about the need of continuing Plavix with Eliquis. PT/OT are recommending home health.  8/26: Hemodynamically stable, remained borderline bradycardic.  Carotid ultrasound without any significant stenosis.  Echocardiogram with normal EF, indeterminate diastolic parameters and no regional wall motion abnormalities.  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  Multifocal moderate stenosis in the anterior right M2 and mild stenosis in the left supraclinoid ICA, A1c 6.5, lipid panel normal with LDL of 28. EEG was without any concern of seizure.  Her home Plavix was discontinued after discussing with cardiology to decrease the risk of bleeding..  Patient will continue on current medications and need to have a close follow-up with her providers for further recommendations.

## 2023-07-03 NOTE — Assessment & Plan Note (Signed)
Estimated body mass index is 37.3 kg/m as calculated from the following:   Height as of this encounter: 5\' 3"  (1.6 m).   Weight as of this encounter: 95.5 kg.   -This will complicate overall prognosis -Encourage weight loss

## 2023-07-03 NOTE — Assessment & Plan Note (Signed)
Heart rate remained well-controlled.  1 prior EKG in 2022 with A-fib.  Likely paroxysmal atrial fibrillation, not on any anticoagulation.  CHA2DS2-VASc of 6. TSH normal -Started on Eliquis -Echocardiogram and cardiology consult pending

## 2023-07-03 NOTE — Evaluation (Signed)
Physical Therapy Evaluation Patient Details Name: LARAINA DEMAINE MRN: [id number] DOB: 1951/10/14 Today's Date: 07/03/2023  History of Present Illness  Pt is a 72 y.o. female with medical history significant of HTN, HLD, DM, PVD, stroke, PAF not on AC, stroke, depression, CKD-3b, psoriasis, chronic venous insufficiency, breast cancer (s/p of right lumpectomy and radiation therapy), obesity, s/p of Roux-en-Y, who presents with syncope.  Clinical Impression   Pt is received in bed, she is agreeable to PT session. No reports of dizziness at the beginning of PT session and no prior falls prior to most recent one. Performed orthostatic vitals to assess possible orthostatic hypotension, no dizziness/lightheadedness symptoms reported throughout position changes but noted BP drop in standing to 139/77 (94) from 153/72 (96) in standing. Pt performs bed mobility with supervision, transfers and amb CGA for safety. Cuing for hand placement and sequencing when using RW during amb with no noted LOB. Pt able to perform standing BLE exercises using RW to promote BLE strengthening and balance. Pt able to amb approx 8 ft using RW to recliner-limited amb distance due to recent mobility specialist visit where Pt amb hallway distance. Pt in recliner at end of session with RN at bedside. Pt would benefit from skilled PT to address above deficits and promote optimal return to PLOF.       If plan is discharge home, recommend the following:     Can travel by private vehicle        Equipment Recommendations None recommended by PT  Recommendations for Other Services       Functional Status Assessment Patient has had a recent decline in their functional status and demonstrates the ability to make significant improvements in function in a reasonable and predictable amount of time.     Precautions / Restrictions Precautions Precautions: Fall Restrictions Weight Bearing Restrictions: No      Mobility  Bed  Mobility Overal bed mobility: Needs Assistance Bed Mobility: Supine to Sit     Supine to sit: Supervision, Used rails     General bed mobility comments: Able to perform bed mobility with use of bed railings    Transfers Overall transfer level: Needs assistance Equipment used: Rolling walker (2 wheels) Transfers: Sit to/from Stand Sit to Stand: Contact guard assist           General transfer comment: Able to perform STS CGA for safety but otherwise modified independent using RW    Ambulation/Gait Ambulation/Gait assistance: Contact guard assist Gait Distance (Feet): 8 Feet Assistive device: Rolling walker (2 wheels) Gait Pattern/deviations: Step-through pattern, Decreased step length - right, Decreased step length - left, Decreased stride length Gait velocity: Decreased     General Gait Details: amb with RW with cuing to maintain RW on floor and maintaining RW close to trunk  Stairs            Wheelchair Mobility     Tilt Bed    Modified Rankin (Stroke Patients Only)       Balance Overall balance assessment: Needs assistance Sitting-balance support: Single extremity supported, Feet supported Sitting balance-Leahy Scale: Good Sitting balance - Comments: Able to maintain seated EOB balance during functional activities   Standing balance support: Bilateral upper extremity supported, During functional activity, Reliant on assistive device for balance Standing balance-Leahy Scale: Good Standing balance comment: Able to maintain static standing balance during functional activities  Pertinent Vitals/Pain Pain Assessment Pain Assessment: No/denies pain (no reports of dizziness)    Home Living Family/patient expects to be discharged to:: Private residence Living Arrangements: Spouse/significant other Available Help at Discharge: Family;Available 24 hours/day Type of Home: House Home Access: Stairs to enter Entrance  Stairs-Rails: Right;Left;Can reach both Entrance Stairs-Number of Steps: 3   Home Layout: One level Home Equipment: Agricultural consultant (2 wheels);BSC/3in1;Cane - single point      Prior Function Prior Level of Function : Independent/Modified Independent             Mobility Comments: uses cane on R side for amb       Extremity/Trunk Assessment   Upper Extremity Assessment Upper Extremity Assessment: Overall WFL for tasks assessed    Lower Extremity Assessment Lower Extremity Assessment: Generalized weakness       Communication   Communication Communication: No apparent difficulties  Cognition Arousal: Alert Behavior During Therapy: WFL for tasks assessed/performed Overall Cognitive Status: Within Functional Limits for tasks assessed                                 General Comments: Pleasant and willing to work with PT        General Comments      Exercises Other Exercises Other Exercises: standing marches x10 using RW, standing heel raises x10 using RW   Assessment/Plan    PT Assessment Patient needs continued PT services  PT Problem List Decreased strength;Decreased mobility;Decreased activity tolerance;Decreased balance       PT Treatment Interventions DME instruction;Gait training;Functional mobility training;Therapeutic activities;Therapeutic exercise    PT Goals (Current goals can be found in the Care Plan section)  Acute Rehab PT Goals Patient Stated Goal: to go home PT Goal Formulation: With patient Time For Goal Achievement: 07/17/23 Potential to Achieve Goals: Good    Frequency Min 1X/week     Co-evaluation               AM-PAC PT "6 Clicks" Mobility  Outcome Measure Help needed turning from your back to your side while in a flat bed without using bedrails?: None Help needed moving from lying on your back to sitting on the side of a flat bed without using bedrails?: None Help needed moving to and from a bed to a chair  (including a wheelchair)?: None Help needed standing up from a chair using your arms (e.g., wheelchair or bedside chair)?: A Little Help needed to walk in hospital room?: A Little Help needed climbing 3-5 steps with a railing? : A Little 6 Click Score: 21    End of Session Equipment Utilized During Treatment: Gait belt Activity Tolerance: Patient tolerated treatment well Patient left: in chair;with call bell/phone within reach;with chair alarm set;with nursing/sitter in room Nurse Communication: Mobility status PT Visit Diagnosis: Unsteadiness on feet (R26.81);Muscle weakness (generalized) (M62.81);Dizziness and giddiness (R42)    Time: 1610-9604 PT Time Calculation (min) (ACUTE ONLY): 19 min   Charges:                 Elmon Else, SPT   Dani Danis 07/03/2023, 10:03 AM

## 2023-07-03 NOTE — Progress Notes (Deleted)
Transition of Care [facility]) - Inpatient Brief Assessment   Patient Details  Name: Elizabeth [name] MRN: [id number] Date of Birth: [birth year]  [facility] CM/SW Contact:    Bing Quarry, RN Phone Number: 07/03/2023, 1:44 PM   Clinical Narrative: 8/25: RN CM reviewing chart for new HH recommendations on discharge but noted no HH orders in discharge orders and patient was discharged while reviewing chart.   Gabriel Cirri MSN RN CM  Transitions of Care Department [facility] [phone] Weekends Only     Transition of Care Asessment: Insurance and Status: Insurance coverage has been reviewed Patient has primary care physician: Yes Home environment has been reviewed: Home with spouse Prior level of function:: Prior Level of Function : Independent/Modified Independent  Mobility Comments: uses cane on R side for amb Prior/Current Home Services: No current home services Social Determinants of Health Reivew: SDOH reviewed no interventions necessary Readmission risk has been reviewed: No (RA not yet available.) Transition of care needs: transition of care needs identified, TOC will continue to follow [facility] PT recommendations but patient discharged while RN CM reviewing recommendations. No HH Orders in order history. OBS status.)

## 2023-07-03 NOTE — Assessment & Plan Note (Signed)
Blood pressure within goal. -Home lisinopril and Lasix was initially held for concern of AKI, keep holding for another day for permissive hypertension

## 2023-07-03 NOTE — Assessment & Plan Note (Signed)
Likely vasovagal but arrhythmia cannot be ruled out at this time.  Concern of paroxysmal atrial fibrillation.  Also found to have a small cerebellar stroke on MRI brain but less likely the cause of this episode. Cardiology consult pending-likely will need cardiac monitoring. Neurology was consulted for stroke and they added MRA of brain and carotid ultrasound to complete the workup.

## 2023-07-03 NOTE — Assessment & Plan Note (Signed)
Continue statin. 

## 2023-07-03 NOTE — Assessment & Plan Note (Signed)
Patient with 1+ lower extremity edema with chronic venous stasis.  Prior echo done in 2022 was normal.  Elevated BNP at 670 but no symptoms of heart failure. -Repeat echocardiogram pending -Currently holding Lasix due to concern of AKI

## 2023-07-03 NOTE — Progress Notes (Signed)
Progress Note   Patient: Elizabeth [name] OZD:[id number] DOB: 1951-07-01 DOA: 07/02/2023     0 DOS: the patient was seen and examined on 07/03/2023   Brief hospital course: Taken from H&P.   GENOVEVA PU is a 72 y.o. female with medical history significant of HTN, HLD, DM, PVD, stroke, PAF not on AC, stroke, depression, CKD-3b, psoriasis, chronic venous insufficiency, breast cancer (s/p of right lumpectomy and radiation therapy), obesity, s/p of Roux-en-Y, who presents with syncope.  Patient was walking to the bathroom when she passed out for few seconds, husband noticed some shaking but no confusion immediately afterwards.  Patient denies any dizziness.  No focal deficit.  She was recently treated for UTI with doxycycline.  EKG done in the ED with A-fib.  No prior diagnosis but patient had another EKG on 03/03/21 which shows A-fib.  Prolonged QTc at 575. Heart rate controlled, COVID, flu and RSV negative.  Worsening renal function. Chest x-ray showed bronchitic change. CT head and C-spine negative for any acute abnormality.  Patient was given magnesium replacement for magnesium of 1.8 with concern of prolonged QTc.  8/25: Vital stable, TSH normal, creatinine with some improvement 1.47, likely her new baseline as it was 1.54-month ago.UA with trace leukocytes and rare bacteria.  BNP elevated at 670. Repeat EKG remained in rate controlled atrial fibrillation, MRI came back positive for a small cerebellar stroke, less likely the cause of the syncopal episode. Neurology ordered MRA of head and carotid ultrasound to Complete the workup. Echocardiogram Cardiology and neurology was consulted. Starting her on Eliquis as CHA2DS2-VASc of 6, patient is already on Plavix at home and need to have a discussion with her cardiologist about the need of continuing Plavix with Eliquis. PT/OT are recommending home health.    Assessment and Plan: * Syncope Likely vasovagal but arrhythmia cannot be ruled  out at this time.  Concern of paroxysmal atrial fibrillation.  Also found to have a small cerebellar stroke on MRI brain but less likely the cause of this episode. Cardiology consult pending-likely will need cardiac monitoring. Neurology was consulted for stroke and they added MRA of brain and carotid ultrasound to complete the workup.   Prolonged QT interval Improved on repeat EKG today. -Continue to monitor   PAF (paroxysmal atrial fibrillation) (HCC) Heart rate remained well-controlled.  1 prior EKG in 2022 with A-fib.  Likely paroxysmal atrial fibrillation, not on any anticoagulation.  CHA2DS2-VASc of 6. TSH normal -Started on Eliquis -Echocardiogram and cardiology consult pending  CVA (cerebral vascular accident) (HCC) Initial CT head was negative but MRI brain with a small cerebellar stroke.  No deficit Neurology was consulted to complete the stroke workup MRA of brain and carotid ultrasound ordered PT/OT are recommending home health Echocardiogram pending Patient is already on Plavix at home, already started on Eliquis, needed discussion with cardiologist whether to continue antiplatelet at this time or not  Acute renal failure superimposed on stage 3b chronic kidney disease (HCC) Creatinine improved to 1.47 today.  Appears to be at baseline now. -Monitor renal function -Avoid nephrotoxins -Holding Lasix and lisinopril for another day for permissive hypertension  Type 2 diabetes mellitus with peripheral vascular disease (HCC) Patient seems to have well-controlled diabetes, recent A1c of 6.4.  Takes Tradjenta at home. -Repeat A1c to complete stroke workup -Continue with SSI  Leg edema Patient with 1+ lower extremity edema with chronic venous stasis.  Prior echo done in 2022 was normal.  Elevated BNP at 670 but no symptoms of  heart failure. -Repeat echocardiogram pending -Currently holding Lasix due to concern of AKI  HLD (hyperlipidemia) -Continue statin  Essential  hypertension Blood pressure within goal. -Home lisinopril and Lasix was initially held for concern of AKI, keep holding for another day for permissive hypertension  UTI (urinary tract infection) Patient recently was given 10 days of doxycycline for concern of UTI, no current symptoms and recently completed the course. -Stop doxycycline  Depression Home Lexapro was held for initial prolonged QTc  Obesity (BMI 30-39.9) Estimated body mass index is 37.3 kg/m as calculated from the following:   Height as of this encounter: 5\' 3"  (1.6 m).   Weight as of this encounter: 95.5 kg.   -This will complicate overall prognosis -Encourage weight loss   Subjective: Patient was seen and examined today.  No new complaints.  No deficit.  Sitting comfortably in chair.  Physical Exam: Vitals:   07/03/23 0500 07/03/23 0756 07/03/23 0954 07/03/23 1210  BP:  (!) 101/55  131/69  Pulse:  (!) 53  (!) 51  Resp:  16  16  Temp:  98 F (36.7 C)  98.5 F (36.9 C)  TempSrc:  Oral    SpO2:  98% 98% 97%  Weight: 95.5 kg     Height:       General.  Obese elderly lady, in no acute distress. Pulmonary.  Lungs clear bilaterally, normal respiratory effort. CV.  Regular rate and rhythm, no JVD, rub or murmur. Abdomen.  Soft, nontender, nondistended, BS positive. CNS.  Alert and oriented .  No focal neurologic deficit. Extremities.  No edema, no cyanosis, pulses intact and symmetrical. Psychiatry.  Judgment and insight appears normal.   Data Reviewed: Prior data reviewed  Family Communication: Husband at bedside  Disposition: Status is: Observation The patient remains OBS appropriate and will d/c before 2 midnights.  Planned Discharge Destination: Home with Home Health  DVT prophylaxis.  Eliquis Time spent: 45 minutes  This record has been created using Conservation officer, historic buildings. Errors have been sought and corrected,but may not always be located. Such creation errors do not reflect on the  standard of care.   Author: Arnetha Courser, MD 07/03/2023 2:58 PM  For on call review [email or web address].

## 2023-07-03 NOTE — Assessment & Plan Note (Signed)
Patient seems to have well-controlled diabetes, recent A1c of 6.4.  Takes Tradjenta at home. -Repeat A1c to complete stroke workup -Continue with SSI

## 2023-07-03 NOTE — Consult Note (Addendum)
Neurology Consultation Reason for Consult: Stroke on MRI Requesting Physician:   CC: Syncope  History is obtained from: Patient and chart review  HPI: Elizabeth [name] is a 72 y.o. female with a past medical history significant for hypertension, hyperlipidemia, diabetes, CKD stage IIIb, right MCA stroke (2005), BMI 37.3, peripheral vascular disease complicated by bilateral toe amputations, glaucoma, breast cancer s/p radiation (2015), for psoriasis, Roux-en-Y procedure, chronic lymphedema  She was in her usual state of health on 8/24.  She does not remember the details of what brought her to the hospital but reports that her husband told her that she lost consciousness while walking to the bathroom.  Per his report she was shaking slightly but when she came to there was no confusion afterwards.  She denies bowel or bladder incontinence or tongue biting  She was found to have atrial fibrillation, for which she has been started on anticoagulation.  MRI revealed a small cerebellar stroke for which neurology is consulted  Per review of neurology notes she had some head jerking events which were felt to be seizures (around 2005) which self resolved and she stopped her seizure medications after several years seizure-free.  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  These events improved on Keppra but then again resolved and she has been off of Keppra for many years  LKW: 8/24 at noon Thrombolytic given?: No, too mild to treat IA performed?: No, exam not c/w LVO Premorbid modified rankin scale:      0 - No symptoms.   ROS: All other review of systems was negative except as noted in the HPI.   Past Medical History:  Diagnosis Date   Cancer [facility])    breast   Carcinoma of right breast (HCC) 05/20/2014   Diabetes mellitus without complication (HCC)     Hypertension    Lymphedema    Personal history of radiation therapy 2015   Right breast   PONV (postoperative nausea and vomiting)    Psoriasis 1990   Stroke (HCC) 2000   residual left arm weakness   Varicose veins    Past Surgical History:  Procedure Laterality Date   AMPUTATION Left 03/04/2021   Procedure: AMPUTATION RAY;  Surgeon: Linus Galas, DPM;  Location: [facility] ORS;  Service: Podiatry;  Laterality: Left;   AMPUTATION TOE Right 01/25/2017   Procedure: AMPUTATION TOE   ;  Surgeon: Recardo Evangelist, DPM;  Location: [facility] ORS;  Service: Podiatry;  Laterality: Right;   BREAST EXCISIONAL BIOPSY Right 2015   [facility] / with radiation   BREAST LUMPECTOMY Right 04-2014   followed by radiation,  no chemo   BREAST REDUCTION SURGERY  1977   CHOLECYSTECTOMY N/A 09/23/2015   Procedure: LAPAROSCOPIC CHOLECYSTECTOMY;  Surgeon: Geoffry Paradise, MD;  Location: [facility] ORS;  Service: General;  Laterality: N/A;   DILATION AND CURETTAGE OF UTERUS  2010   EYE SURGERY Left 2015   cataracts and left eye detached retina repair   EYE SURGERY Right 2013   cataract with len implant   GASTRIC ROUX-EN-Y N/A 09/23/2015   Procedure: LAPAROSCOPIC ROUX-EN-Y GASTRIC;  Surgeon: Geoffry Paradise, MD;  Location: [facility] ORS;  Service: General;  Laterality: N/A;   HIATAL HERNIA REPAIR N/A 09/23/2015   Procedure: LAPAROSCOPIC REPAIR OF HIATAL HERNIA;  Surgeon: Geoffry Paradise, MD;  Location: [facility] OR[facility];  Service: General;  Laterality:  N/A;   IRRIGATION AND DEBRIDEMENT FOOT Right 01/25/2017   Procedure: IRRIGATION AND DEBRIDEMENT FOOT;  Surgeon: Recardo Evangelist, DPM;  Location: [facility] OR[facility];  Service: Podiatry;  Laterality: Right;   IRRIGATION AND DEBRIDEMENT FOOT Right 01/29/2017   Procedure: IRRIGATION AND DEBRIDEMENT FOOT and application of wound vac;  Surgeon: Recardo Evangelist, DPM;  Location: [facility];  Service: Podiatry;  Laterality: Right;   LOWER EXTREMITY ANGIOGRAPHY Right 01/28/2017   Procedure: Lower Extremity Angiography;  Surgeon: Renford Dills, MD;  Location: [facility];  Service: Cardiovascular;  Laterality: Right;   LOWER EXTREMITY ANGIOGRAPHY Left 03/06/2021   Procedure: Lower Extremity Angiography;  Surgeon: Renford Dills, MD;  Location: [facility];  Service: Cardiovascular;  Laterality: Left;   LOWER EXTREMITY INTERVENTION  01/28/2017   Procedure: Lower Extremity Intervention;  Surgeon: Renford Dills, MD;  Location: [facility];  Service: Cardiovascular;;   REDUCTION MAMMAPLASTY     Current Outpatient Medications  Medication Instructions   acetaminophen (TYLENOL) 650 mg, Oral, Every 6 hours PRN   Calcium Carb-Cholecalciferol (CALCIUM 1000 + D PO) 1 tablet, Oral, Daily   clopidogrel (PLAVIX) 75 mg, Oral, Daily   conjugated estrogens (PREMARIN) vaginal cream Estrogen Cream Instruction Discard applicator Apply pea sized amount to tip of finger to urethra before bed. Wash hands well after application. Use Monday, Wednesday and Friday   doxycycline (VIBRAMYCIN) 100 mg, Oral, 2 times daily   escitalopram (LEXAPRO) 10 mg, Daily   ferrous gluconate (FERGON) 324 MG tablet Take by mouth.   furosemide (LASIX) 20 mg, Oral, Daily   gabapentin (NEURONTIN) 300 mg, Oral, Daily   linagliptin (TRADJENTA) 5 mg, Oral, Daily   lisinopril (ZESTRIL) 20 mg, Oral, Daily   potassium chloride (KLOR-CON) 10 MEQ tablet 10 mEq, Daily   pravastatin (PRAVACHOL) 40 mg, Oral, Daily, In afternoon   TRUE METRIX BLOOD GLUCOSE TEST test strip No dose, route, or frequency recorded.   TRUEPLUS LANCETS 33G MISC No dose, route, or frequency recorded.     Family History  Problem Relation Age of Onset   Diabetes Mother    Cancer Mother    Breast cancer Mother 54   Diabetes Father    Diabetes Sister    Diabetes Brother     Social History:  reports that she has never smoked. She has never been exposed to tobacco smoke. She has never used smokeless tobacco. She reports that she does not drink alcohol and does not  use drugs.   Exam: Current vital signs: BP 131/69 (BP Location: Left Arm)   Pulse (!) 51   Temp 98.5 F (36.9 C)   Resp 16   Ht 5\' 3"  (1.6 m)   Wt 95.5 kg   SpO2 97%   BMI 37.30 kg/m  Vital signs in last 24 hours: Temp:  [98 F (36.7 C)-98.5 F (36.9 C)] 98.5 F (36.9 C) (08/25 1210) Pulse Rate:  [51-101] 51 (08/25 1210) Resp:  [16-22] 16 (08/25 1210) BP: (101-173)/(55-115) 131/69 (08/25 1210) SpO2:  [97 %-99 %] 97 % (08/25 1210) Weight:  [94.3 kg-95.5 kg] 95.5 kg (08/25 0500)   Physical Exam  Constitutional: Appears fatigued appropriate for time of day, chronically mildly ill Psych: Affect cooperative, slightly flat Eyes: No scleral injection.  Left eye chronically blind and scarred HENT: No oropharyngeal obstruction.  MSK: Toe amputations bilaterally Cardiovascular: Perfusing extremities well Respiratory: Effort normal, non-labored breathing GI: Soft.  No distension. There is no tenderness.  Skin: Lymphedema of the bilateral  lower extremities, chronic with some chronic skin changes  Neuro: Mental Status: Patient is awake, alert, oriented to person, place, month, year, and situation. Patient is able to give a clear and coherent history despite being amnestic to the event No signs of aphasia or neglect Cranial Nerves: II: Visual Fields are full in the right eye.  Right pupil is round and reactive to light.  Left pupil cannot be examined due to scarring III,IV, VI: EOMI without ptosis or diploplia in the right eye.  She preferentially keeps the left eye closed due to the chronic scarring V: Facial sensation is symmetric to light touch VII: Facial movement is symmetric.  VIII: hearing is intact to voice X: Uvula elevates symmetrically XI: Shoulder shrug is symmetric. XII: tongue is midline without atrophy or fasciculations.  Motor: Tone is normal. Bulk is normal. 5/5 strength was present in all four extremities.  Sensory: Sensation is symmetric to light touch and  temperature in the arms and legs. Deep Tendon Reflexes: 2+ and symmetric in the brachioradialis and patellae.  Cerebellar: FNF and HKS are intact bilaterally Gait:  Deferred   NIHSS total 0    I have reviewed labs in epic and the results pertinent to this consultation are:   Basic Metabolic Panel: Recent Labs  Lab 07/02/23 2016 07/03/23 0443  NA 139 137  K 4.2 3.6  CL 105 106  CO2 23 23  GLUCOSE 141* 128*  BUN 23 22  CREATININE 1.58* 1.47*  CALCIUM 8.7* 8.6*  MG 1.8  --     CBC: Recent Labs  Lab 07/02/23 2016 07/03/23 0443  WBC 7.6 9.0  HGB 12.7 12.5  HCT 39.6 38.2  MCV 89.0 88.2  PLT 191 208    Coagulation Studies: No results for input(s): "LABPROT", "INR" in the last 72 hours.    Lab Results  Component Value Date   HGBA1C 6.5 (H) 07/03/2023   Lab Results  Component Value Date   CHOL 84 07/03/2023   HDL 43 07/03/2023   LDLCALC 28 07/03/2023   TRIG 67 07/03/2023   CHOLHDL 2.0 07/03/2023   ECHO 5/57 IMPRESSIONS   1. Left ventricular ejection fraction, by estimation, is 55 to 60%. The  left ventricle has normal function. The left ventricle has no regional  wall motion abnormalities. Left ventricular diastolic parameters are  indeterminate.   2. Right ventricular systolic function is normal. The right ventricular  size is mildly enlarged.   3. The mitral valve is normal in structure. Mild mitral valve  regurgitation.   4. The aortic valve is tricuspid. Aortic valve regurgitation is not  visualized.   5. The inferior vena cava is normal in size with <50% respiratory  variability, suggesting right atrial pressure of 8 mmHg.  Comparison(s): The left ventricular function is unchanged.   Cartoid [place] Minimal to moderate amount of bilateral atherosclerotic plaque, right-greater-than-left, morphologically similar to the 04/2021 examination and again not resulting in a hemodynamically significant stenosis within either internal carotid artery.  I  have reviewed the images obtained:  MRI brain Small acute infarct in the left cerebellum.   MRA personally reviewed, no evidence of vertebrobasilar stenosis or culprit vascular stenosis correlating to her infarct in the visualized portions of the vertebral arteries However full radiology report is pending  Impression: Embolic stroke secondary to A-fib (newly diagnosed). S/p Roux-en-Y she is at risk of vitamin deficiencies including B12 deficiency which can increase risk of stroke.  Regarding her syncopal event, unclear etiology.  Orthostatic vital signs  suggestive of possible POTS and potential for hypoperfusion if she was briefly in A-fib with RVR (though normal rates here).  Cardiology notes no pauses.  With the small cerebellar stroke it is possible she had a TIA to the posterior circulation leading to syncope with the majority of the clot spontaneously resolving.  Given immediate return to baseline seizures felt to be less likely but she does have a seizure history and prior cortical stroke that puts her at significant risk for seizures so I do think an EEG to screen for cortical irritability is reasonable  Recommendations: -Agree with anticoagulation, given small size risk of intracerebral hemorrhage is outweighed by benefit -A1c meeting goal of less than 7% at 6.5% -LDL meeting goal of less than 70 (from neurologic perspective) at 51 -From a neurologic perspective would stop Plavix, however defer to cardiology if this is needed for cardiac indication (patient denies any stents placed in the legs as well, which should be confirmed by primary team and discussed with vascular surgery if needed) -B12 level, supplement for goal greater than 400 -Follow-up with PCP or GI for appropriate vitamin supplementation post Roux-en-Y -EEG for syncope workup -Neurology will follow-up official MRA report as well as EEG but otherwise will sign off.  Please do not hesitate to reach out if any questions or  concerns arise -Discussed with primary team via secure chat  Brooke Dare MD-PhD [facility] [phone] [facility] coverage for [facility] is from 8 AM to 4 AM in-house and 4 PM to 8 PM by telephone/video. 8 PM to 8 AM emergent questions or overnight urgent questions should be addressed to Teleneurology On-call or [facility] neurohospitalist; contact information can be found on AMION

## 2023-07-03 NOTE — Assessment & Plan Note (Signed)
Creatinine improved to 1.47 today.  Appears to be at baseline now. -Monitor renal function -Avoid nephrotoxins -Holding Lasix and lisinopril for another day for permissive hypertension

## 2023-07-03 NOTE — Consult Note (Signed)
Cardiology Consultation   Elizabeth [name] ID: Elizabeth [name] MRN: [id number]; DOB: 1951-10-17  Admit date: 07/02/2023 Date of Consult: 07/03/2023  PCP:  Leanna Sato, MD   [facility] Providers Cardiologist:  Debbe Odea, MD   {   Elizabeth [name]:  HTN AMICA LUDWIGSEN is a 72 y.o. female with a hx of HTN who is being seen 07/03/2023 for Elizabeth evaluation of syncop at Elizabeth request of Dr Nelson Chimes.  History of Present Illness:   Elizabeth [name] is a 72 yo with hx of HTN, HL, T2DM, CKD IIIb, TIA, CVA (1990s)  PVOD,  venous insufficincy, breast CA Admitted in July 2024 for Ecoli sepsis due to UTI.  Trop negative at admit  Last seen in cardiology clinic on Jun 11, 2022 by R Dunn Zio patch n 2022 average HR 65.   2 pauses longest 3.4 sec, some first degree AV block and Type I second degree AV block   Echo in 2022 :  LVEF and RVEF nromal  Negative bubble study  Elizabeth [name] says she has developed swelling in her legs over Elizabeth past week or 2   Otherwise felt ok   No change in diet  Yesterday she was up, went to bathroom  In bathroom she passed out     Husband with her at time  No injury She denies dizziness prior  No palpitations  No SOB   No CP   No weakness  In ER found to be in atrial fibrillation     SInce admit she has been bradycardic in afib   40s to 60s  NO pauses    MRI of brain today shows small acute infarct in cerebellum      Past Medical History:  Diagnosis Date   Cancer [facility])    breast   Carcinoma of right breast (HCC) 05/20/2014   Diabetes mellitus without complication (HCC)    Hypertension    Lymphedema    Personal history of radiation therapy 2015   Right breast   PONV (postoperative nausea and vomiting)    Psoriasis 1990   Stroke (HCC) 2000   residual left arm weakness   Varicose veins     Past Surgical History:  Procedure Laterality Date   AMPUTATION Left 03/04/2021   Procedure: AMPUTATION RAY;  Surgeon: Linus Galas, DPM;  Location: [facility] ORS;  Service: Podiatry;   Laterality: Left;   AMPUTATION TOE Right 01/25/2017   Procedure: AMPUTATION TOE   ;  Surgeon: Recardo Evangelist, DPM;  Location: [facility] OR[facility];  Service: Podiatry;  Laterality: Right;   BREAST EXCISIONAL BIOPSY Right 2015   [facility] with radiation   BREAST LUMPECTOMY Right 04-2014   followed by radiation,  no chemo   BREAST REDUCTION SURGERY  1977   CHOLECYSTECTOMY N/A 09/23/2015   Procedure: LAPAROSCOPIC CHOLECYSTECTOMY;  Surgeon: Geoffry Paradise, MD;  Location: [facility] ORS;  Service: General;  Laterality: N/A;   DILATION AND CURETTAGE OF UTERUS  2010   EYE SURGERY Left 2015   cataracts and left eye detached retina repair   EYE SURGERY Right 2013   cataract with len implant   GASTRIC ROUX-EN-Y N/A 09/23/2015   Procedure: LAPAROSCOPIC ROUX-EN-Y GASTRIC;  Surgeon: Geoffry Paradise, MD;  Location: [facility] OR[facility];  Service: General;  Laterality: N/A;   HIATAL HERNIA REPAIR N/A 09/23/2015   Procedure: LAPAROSCOPIC REPAIR OF HIATAL HERNIA;  Surgeon: Geoffry Paradise, MD;  Location: [facility];  Service: General;  Laterality: N/A;   IRRIGATION AND DEBRIDEMENT  FOOT Right 01/25/2017   Procedure: IRRIGATION AND DEBRIDEMENT FOOT;  Surgeon: Recardo Evangelist, DPM;  Location: [facility];  Service: Podiatry;  Laterality: Right;   IRRIGATION AND DEBRIDEMENT FOOT Right 01/29/2017   Procedure: IRRIGATION AND DEBRIDEMENT FOOT and application of wound vac;  Surgeon: Recardo Evangelist, DPM;  Location: [facility];  Service: Podiatry;  Laterality: Right;   LOWER EXTREMITY ANGIOGRAPHY Right 01/28/2017   Procedure: Lower Extremity Angiography;  Surgeon: Renford Dills, MD;  Location: [facility];  Service: Cardiovascular;  Laterality: Right;   LOWER EXTREMITY ANGIOGRAPHY Left 03/06/2021   Procedure: Lower Extremity Angiography;  Surgeon: Renford Dills, MD;  Location: [facility];  Service: Cardiovascular;  Laterality: Left;   LOWER EXTREMITY INTERVENTION  01/28/2017   Procedure: Lower Extremity Intervention;  Surgeon: Renford Dills, MD;  Location: [facility];  Service: Cardiovascular;;   REDUCTION MAMMAPLASTY       Home Medications:  Prior to Admission medications   Medication Sig Start Date End Date Taking? Authorizing Provider  Calcium Carb-Cholecalciferol (CALCIUM 1000 + D PO) Take 1 tablet by mouth daily.   Yes [provider]  clopidogrel (PLAVIX) 75 MG tablet Take 75 mg by mouth daily. 11/06/15  Yes [provider]  conjugated estrogens (PREMARIN) vaginal cream Estrogen Cream Instruction Discard applicator Apply pea sized amount to tip of finger to urethra before bed. Wash hands well after application. Use Monday, Wednesday and Friday 01/05/23  Yes Sondra Come, MD  doxycycline (VIBRAMYCIN) 100 MG capsule Take 100 mg by mouth 2 (two) times daily. 06/20/23  Yes [provider]  furosemide (LASIX) 20 MG tablet Take 20 mg by mouth daily. 05/11/23  Yes [provider]  gabapentin (NEURONTIN) 300 MG capsule Take 300 mg by mouth daily. 06/14/23  Yes [provider]  linagliptin (TRADJENTA) 5 MG TABS tablet Take 5 mg by mouth daily.   Yes [provider]  lisinopril (ZESTRIL) 20 MG tablet Take 20 mg by mouth daily. 04/18/23  Yes [provider]  pravastatin (PRAVACHOL) 40 MG tablet Take 40 mg by mouth daily. In afternoon   Yes [provider]  acetaminophen (TYLENOL) 325 MG tablet Take 2 tablets (650 mg total) by mouth every 6 (six) hours as needed for mild pain (or Fever >/= 101). 03/07/21   Esaw Grandchild A, DO  escitalopram (LEXAPRO) 10 MG tablet Take 10 mg by mouth daily. Elizabeth [name] not taking: Reported on 07/02/2023 04/18/23   [provider]  ferrous gluconate (FERGON) 324 MG tablet Take by mouth. Elizabeth [name] not taking: Reported on 07/02/2023    [provider]  potassium chloride (KLOR-CON) 10 MEQ tablet Take 10 mEq by mouth daily. Elizabeth [name] not taking: Reported on 07/02/2023 04/18/23   [provider]  TRUE METRIX  BLOOD GLUCOSE TEST test strip  10/13/18   [provider]  TRUEPLUS LANCETS 33G MISC  10/13/18   [provider]    Inpatient Medications: Scheduled Meds:  apixaban  5 mg Oral BID   calcium-vitamin D  1 tablet Oral Daily   clopidogrel  75 mg Oral Daily   gabapentin  300 mg Oral Daily   insulin aspart  0-5 Units Subcutaneous QHS   insulin aspart  0-9 Units Subcutaneous TID WC   pravastatin  40 mg Oral q1600   Continuous Infusions:  PRN Meds: acetaminophen, albuterol, dextromethorphan-guaiFENesin, diphenhydrAMINE, hydrALAZINE, LORazepam  Allergies:   No Known Allergies  Social History:   Social History   Socioeconomic  History   Marital status: Married    Spouse name: [name]   Number of children: Not on file   Years of education: Not on file   Highest education level: Not on file  Occupational History   Not on file  Tobacco Use   Smoking status: Never    Passive exposure: Never   Smokeless tobacco: Never  Vaping Use   Vaping status: Never Used  Substance and Sexual Activity   Alcohol use: No    Alcohol/week: 0.0 standard drinks of alcohol   Drug use: No   Sexual activity: Yes    Birth control/protection: Post-menopausal  Other Topics Concern   Not on file  Social History Narrative   Not on file   Social Determinants of Health   Financial Resource Strain: Not on file  Food Insecurity: No Food Insecurity (07/02/2023)   Hunger Vital Sign    Worried About Running Out of Food in Elizabeth Last Year: Never true    Ran Out of Food in Elizabeth Last Year: Never true  Transportation Needs: No Transportation Needs (07/02/2023)   PRAPARE - Administrator, Civil Service (Medical): No    Lack of Transportation (Non-Medical): No  Physical Activity: Not on file  Stress: Not on file  Social Connections: Not on file  Intimate Partner Violence: Not At Risk (07/02/2023)   Humiliation, Afraid, Rape, and Kick questionnaire    Fear of Current or Ex-Partner:  No    Emotionally Abused: No    Physically Abused: No    Sexually Abused: No    Family History:    Family History  Problem Relation Age of Onset   Diabetes Mother    Cancer Mother    Breast cancer Mother 42   Diabetes Father    Diabetes Sister    Diabetes Brother      ROS:  Please see Elizabeth history of present illness.   All other ROS reviewed and negative.     Physical Exam/Data:   Vitals:   07/03/23 0500 07/03/23 0756 07/03/23 0954 07/03/23 1210  BP:  (!) 101/55  131/69  Pulse:  (!) 53  (!) 51  Resp:  16  16  Temp:  98 F (36.7 C)  98.5 F (36.9 C)  TempSrc:  Oral    SpO2:  98% 98% 97%  Weight: 95.5 kg     Height:        Intake/Output Summary (Last 24 hours) at 07/03/2023 1500 Last data filed at 07/03/2023 1445 Gross per 24 hour  Intake 360 ml  Output 50 ml  Net 310 ml      07/03/2023    5:00 AM 07/02/2023    8:13 PM 05/26/2023    4:05 PM  Last 3 Weights  Weight (lbs) 210 lb 8.6 oz 208 lb 211 lb  Weight (kg) 95.5 kg 94.348 kg 95.709 kg     Body mass index is 37.3 kg/m.  General:  Well nourished, well developed, in no acute distress HEENT: normal Neck: no JVD Vascular: No carotid bruits; Distal pulses 2+ bilaterally Cardiac  Ireg irreg  Normal S1, S2  No S3  no murmurs   Lungs:  clear to auscultation bilaterally, Abd: soft, nontender, no hepatomegaly  Ext: 2+ edema Musculoskeletal:  No deformities, BUE and BLE strength normal and equal Skin: Sl erythema ankles with some sores  Neuro:  Deferred  Psych:  Normal affect   EKG:  Elizabeth EKG was personally reviewed and demonstrates:  Atrial fibrillation  68 bpm   Telemetry:  Telemetry was personally reviewed and demonstrates:  Afib 40s to 60s     Relevant CV Studies: Zio patch 07/2021: Elizabeth [name] had a min HR of 43 bpm, max HR of 203 bpm, and avg HR of 65 bpm. Predominant underlying rhythm was Sinus Rhythm. First Degree AV Block was present. 2 Pauses occurred, Elizabeth longest lasting 3.4 secs (17 bpm). Second Degree  AV Block-Mobitz I  (Wenckebach) was present. Isolated SVEs were rare (<1.0%), SVE Couplets were rare (<1.0%), and SVE Triplets were rare (<1.0%). Isolated VEs were rare (<1.0%), VE Couplets were rare (<1.0%), and no VE Triplets were present. No evidence of atrial fibrillation or atrial flutter noted to explain symptoms of CVA. __________   Limited echo with bubble study 04/27/2021: 1. Left ventricular ejection fraction, by estimation, is 60 to 65%. Elizabeth  left ventricle has normal function. Elizabeth left ventricle has no regional  wall motion abnormalities. Left ventricular diastolic parameters were  normal.   2. Right ventricular systolic function is normal. Elizabeth right ventricular  size is normal.   3. Elizabeth mitral valve is normal in structure. Mild mitral valve  regurgitation.   4. Elizabeth aortic valve is normal in structure. Aortic valve regurgitation is  not visualized.   IAS/Shunts: No atrial level shunt detected by color flow Doppler. Agitated  saline contrast was given intravenously to evaluate for intracardiac  shunting. There is no evidence of a patent foramen ovale. There is no  evidence of an atrial septal defect.  __________   Carotid artery ultrasound 04/27/2021: IMPRESSION: Right greater than left carotid bifurcation atherosclerosis with less than 50% stenosis.    Laboratory Data:  High Sensitivity Troponin:  No results for input(s): "TROPONINIHS" in Elizabeth last 720 hours.   Chemistry Recent Labs  Lab 07/02/23 2016 07/03/23 0443  NA 139 137  K 4.2 3.6  CL 105 106  CO2 23 23  GLUCOSE 141* 128*  BUN 23 22  CREATININE 1.58* 1.47*  CALCIUM 8.7* 8.6*  MG 1.8  --   GFRNONAA 35* 38*  ANIONGAP 11 8    No results for input(s): "PROT", "ALBUMIN", "AST", "ALT", "ALKPHOS", "BILITOT" in Elizabeth last 168 hours. Lipids  Recent Labs  Lab 07/03/23 1413  CHOL 84  TRIG 67  HDL 43  LDLCALC 28  CHOLHDL 2.0    Hematology Recent Labs  Lab 07/02/23 2016 07/03/23 0443  WBC 7.6 9.0   RBC 4.45 4.33  HGB 12.7 12.5  HCT 39.6 38.2  MCV 89.0 88.2  MCH 28.5 28.9  MCHC 32.1 32.7  RDW 13.6 13.5  PLT 191 208   Thyroid  Recent Labs  Lab 07/03/23 0443  TSH 0.847    BNP Recent Labs  Lab 07/02/23 2016  BNP 670.1*    DDimer No results for input(s): "DDIMER" in Elizabeth last 168 hours.   Radiology/Studies:  MR BRAIN WO CONTRAST  Result Date: 07/03/2023 CLINICAL DATA:  Syncope/presyncope, cerebrovascular cause suspected EXAM: MRI HEAD WITHOUT CONTRAST TECHNIQUE: Multiplanar, multiecho pulse sequences of Elizabeth brain and surrounding structures were obtained without intravenous contrast. COMPARISON:  04/27/2021 MRI head, correlation is also made with 07/02/2023 CT head FINDINGS: Brain: Restricted diffusion with mild ADC correlate in Elizabeth left cerebellum (series 9, image 8), measuring up to 10 mm (series 7, image 13). No acute hemorrhage, mass, mass effect, or midline shift. No hydrocephalus or extra-axial collection. No hemosiderin deposition to suggest remote hemorrhage. Redemonstrated encephalomalacia in Elizabeth right anterior MCA territory, consistent with remote infarct. Wallerian  degeneration is noted extending Elizabeth right corticospinal tracts. Additional remote infarct right occipital lobe. Small lacunar infarcts in Elizabeth bilateral cerebellar hemispheres, thalami, basal ganglia. T2 hyperintense signal in Elizabeth periventricular white matter and pons, likely Elizabeth sequela of chronic small vessel ischemic disease. Vascular: Normal arterial flow voids. Skull and upper cervical spine: Normal marrow signal. Sinuses/Orbits: Clear paranasal sinuses. Redemonstrated small left globe, with linear T2 hypointense signal and incomplete suppression on FLAIR. Status post right lens replacement. Otherwise unremarkable right orbit. Other: Elizabeth mastoid air cells are well aerated. IMPRESSION: Small acute infarct in Elizabeth left cerebellum. These results will be called to Elizabeth ordering clinician or representative by Elizabeth  Radiologist Assistant, and communication documented in Elizabeth PACS or [employer]. Electronically Signed   By: Wiliam Ke M.D.   On: 07/03/2023 03:28   CT HEAD WO CONTRAST  Result Date: 07/02/2023 CLINICAL DATA:  Syncope, fall, seizure like act EXAM: CT HEAD WITHOUT CONTRAST CT CERVICAL SPINE WITHOUT CONTRAST TECHNIQUE: Multidetector CT imaging of Elizabeth head and cervical spine was performed following Elizabeth standard protocol without intravenous contrast. Multiplanar CT image reconstructions of Elizabeth cervical spine were also generated. RADIATION DOSE REDUCTION: This exam was performed according to Elizabeth departmental dose-optimization program which includes automated exposure control, adjustment of the mA and/or kV according to Elizabeth [name] size and/or use of iterative reconstruction technique. COMPARISON:  04/26/2021 CT head, no prior CT cervical spine FINDINGS: CT HEAD FINDINGS Brain: No evidence of acute infarct, hemorrhage, mass, mass effect, or midline shift. No hydrocephalus or extra-axial fluid collection. Redemonstrated encephalomalacia in Elizabeth right frontal lobe and right occipital lobe, likely sequela of remote infarcts. Vascular: No hyperdense vessel. Atherosclerotic calcifications in Elizabeth intracranial carotid and vertebral arteries. Skull: Negative for fracture or focal lesion. Sinuses/Orbits: Mucosal thickening in Elizabeth ethmoid air cells. Status post bilateral lens replacements. Other: Elizabeth mastoid air cells are well aerated. CT CERVICAL SPINE FINDINGS Alignment: No traumatic listhesis. Straightening and mild reversal of Elizabeth normal cervical lordosis. Skull base and vertebrae: No acute fracture or suspicious osseous lesion. Fusion of C2 and C3, likely congenital. Soft tissues and spinal canal: No prevertebral fluid or swelling. No visible canal hematoma. Disc levels: Degenerative changes in Elizabeth cervical spine. No significant spinal canal stenosis. Upper chest: Negative. IMPRESSION: 1. No acute intracranial process.  2. No acute fracture or traumatic listhesis in Elizabeth cervical spine. Electronically Signed   By: Wiliam Ke M.D.   On: 07/02/2023 21:11   CT Cervical Spine Wo Contrast  Result Date: 07/02/2023 CLINICAL DATA:  Syncope, fall, seizure like act EXAM: CT HEAD WITHOUT CONTRAST CT CERVICAL SPINE WITHOUT CONTRAST TECHNIQUE: Multidetector CT imaging of Elizabeth head and cervical spine was performed following Elizabeth standard protocol without intravenous contrast. Multiplanar CT image reconstructions of Elizabeth cervical spine were also generated. RADIATION DOSE REDUCTION: This exam was performed according to Elizabeth departmental dose-optimization program which includes automated exposure control, adjustment of the mA and/or kV according to Elizabeth [name] size and/or use of iterative reconstruction technique. COMPARISON:  04/26/2021 CT head, no prior CT cervical spine FINDINGS: CT HEAD FINDINGS Brain: No evidence of acute infarct, hemorrhage, mass, mass effect, or midline shift. No hydrocephalus or extra-axial fluid collection. Redemonstrated encephalomalacia in Elizabeth right frontal lobe and right occipital lobe, likely sequela of remote infarcts. Vascular: No hyperdense vessel. Atherosclerotic calcifications in Elizabeth intracranial carotid and vertebral arteries. Skull: Negative for fracture or focal lesion. Sinuses/Orbits: Mucosal thickening in Elizabeth ethmoid air cells. Status post bilateral lens replacements. Other: Elizabeth mastoid air cells are well  aerated. CT CERVICAL SPINE FINDINGS Alignment: No traumatic listhesis. Straightening and mild reversal of Elizabeth normal cervical lordosis. Skull base and vertebrae: No acute fracture or suspicious osseous lesion. Fusion of C2 and C3, likely congenital. Soft tissues and spinal canal: No prevertebral fluid or swelling. No visible canal hematoma. Disc levels: Degenerative changes in Elizabeth cervical spine. No significant spinal canal stenosis. Upper chest: Negative. IMPRESSION: 1. No acute intracranial process. 2. No  acute fracture or traumatic listhesis in Elizabeth cervical spine. Electronically Signed   By: Wiliam Ke M.D.   On: 07/02/2023 21:11   DG Chest Portable 1 View  Result Date: 07/02/2023 CLINICAL DATA:  Shortness of breath with cough and congestion EXAM: PORTABLE CHEST 1 VIEW COMPARISON:  06/05/2022 FINDINGS: Upper normal heart size. Mildly indistinct pulmonary vasculature, pulmonary venous hypertension is not excluded. Airway thickening is present, suggesting bronchitis or reactive airways disease. No airspace opacity identified. No blunting of Elizabeth costophrenic angles. No significant bony abnormality is observed. IMPRESSION: 1. Airway thickening is present, suggesting bronchitis or reactive airways disease. 2. Mildly indistinct pulmonary vasculature, pulmonary venous hypertension is not excluded. Upper normal heart size. Electronically Signed   By: Gaylyn Rong M.D.   On: 07/02/2023 20:58     Assessment and Plan:   Syncope   No prodrome    On arrival to ER found to be in afib which is a new dx  HR slow at times    No pauses. Yesterday orthostatics reported to  show 50 point increase in HR, BP stable 140s/150s.  Consistent with postural orthostatic tachycardia, though afib makes conclusion somewhat limited    MRI with small cerebellar infarct   ? If this explains syncope above  REcomm Continue tele.  Has had relative bradycardia in bed  No pauses   Echo done   Will review Neuro also evaluating   TO have MRA today  Orthostatics in am     2  Atrial fibrillation  This is a new diagnosis for her     Keep on tele ELiquis has started    Will review echo       3  HTN  [name] on lisinoprol 20 and lasix at home  These were stopped due to bump in Cr  BP is better today  Follow for now esp with small CVA   4  Neuro  Small cerebellar CVA on MRI   Neuro consulted   On Eliquis     MRA ordered   Will review echo   5  Renal   Cr 1.58 on admission (1.04 at baseline )  Today it is 1.47  Follow      6   LDL  28  HDL 43  Trig 67      Will continue to follow    For questions or updates, please contact [facility] Please consult [email or web address] for contact info under    Signed, Dietrich Pates, MD  07/03/2023 3:00 PM

## 2023-07-03 NOTE — Assessment & Plan Note (Signed)
Home Lexapro was held for initial prolonged QTc

## 2023-07-03 NOTE — Assessment & Plan Note (Signed)
Patient recently was given 10 days of doxycycline for concern of UTI, no current symptoms and recently completed the course. -Stop doxycycline

## 2023-07-03 NOTE — Progress Notes (Signed)
  Echocardiogram 2D Echocardiogram has been performed. Definity IV ultrasound imaging agent used on this study.  Elizabeth [name] 07/03/2023, 3:36 PM

## 2023-07-03 NOTE — TOC Progression Note (Signed)
Transition of Care [facility]) - Progression Note    Patient Details  Name: Elizabeth [name] MRN: [id number] Date of Birth: 19-Nov-1950  Transition of Care [facility]) CM/SW Contact  Bing Quarry, RN Phone Number: 07/03/2023, 4:57 PM  Clinical Narrative:  8/25: New HH recommendations from PT. No DME. Attempted to discuss HH options with patient but was unavailable. Also left a VM on mobile. Was last active with [facility] in 2022 for [facility].   Gabriel Cirri MSN RN CM  Transitions of Care Department [facility] [phone] Weekends Only          Expected Discharge Plan and Services                                               Social Determinants of Health (SDOH) Interventions SDOH Screenings   Food Insecurity: No Food Insecurity (07/02/2023)  Housing: Low Risk  (07/02/2023)  Transportation Needs: No Transportation Needs (07/02/2023)  Utilities: Not At Risk (07/02/2023)  Tobacco Use: Low Risk  (07/02/2023)    Readmission Risk Interventions     No data to display

## 2023-07-04 ENCOUNTER — Ambulatory Visit: Payer: Medicare PPO

## 2023-07-04 DIAGNOSIS — N179 Acute kidney failure, unspecified: Secondary | ICD-10-CM

## 2023-07-04 DIAGNOSIS — R55 Syncope and collapse: Secondary | ICD-10-CM | POA: Diagnosis not present

## 2023-07-04 DIAGNOSIS — R600 Localized edema: Secondary | ICD-10-CM

## 2023-07-04 DIAGNOSIS — N1832 Chronic kidney disease, stage 3b: Secondary | ICD-10-CM

## 2023-07-04 DIAGNOSIS — I639 Cerebral infarction, unspecified: Secondary | ICD-10-CM | POA: Diagnosis not present

## 2023-07-04 DIAGNOSIS — R9431 Abnormal electrocardiogram [ECG] [EKG]: Secondary | ICD-10-CM | POA: Diagnosis not present

## 2023-07-04 DIAGNOSIS — I480 Paroxysmal atrial fibrillation: Secondary | ICD-10-CM | POA: Diagnosis not present

## 2023-07-04 LAB — GLUCOSE, CAPILLARY
Glucose-Capillary: 101 mg/dL — ABNORMAL HIGH (ref 70–99)
Glucose-Capillary: 119 mg/dL — ABNORMAL HIGH (ref 70–99)
Glucose-Capillary: 144 mg/dL — ABNORMAL HIGH (ref 70–99)

## 2023-07-04 LAB — BASIC METABOLIC PANEL
Anion gap: 7 (ref 5–15)
BUN: 25 mg/dL — ABNORMAL HIGH (ref 8–23)
CO2: 24 mmol/L (ref 22–32)
Calcium: 8.2 mg/dL — ABNORMAL LOW (ref 8.9–10.3)
Chloride: 104 mmol/L (ref 98–111)
Creatinine, Ser: 1.47 mg/dL — ABNORMAL HIGH (ref 0.44–1.00)
GFR, Estimated: 38 mL/min — ABNORMAL LOW (ref >60)
Glucose, Bld: 108 mg/dL — ABNORMAL HIGH (ref 70–99)
Potassium: 3.9 mmol/L (ref 3.5–5.1)
Sodium: 135 mmol/L (ref 135–145)

## 2023-07-04 LAB — VITAMIN B12: Vitamin B-12: 129 pg/mL — ABNORMAL LOW (ref 180–914)

## 2023-07-04 LAB — MAGNESIUM: Magnesium: 2 mg/dL (ref 1.7–2.4)

## 2023-07-04 MED ORDER — APIXABAN 5 MG PO TABS: 5.0000 mg | ORAL_TABLET | Freq: Two times a day (BID) | ORAL | 2 refills | Status: DC

## 2023-07-04 MED ORDER — PNEUMOCOCCAL 20-VAL CONJ VACC 0.5 ML IM SUSY: 0.5000 mL | PREFILLED_SYRINGE | INTRAMUSCULAR | Status: DC

## 2023-07-04 MED ORDER — PNEUMOCOCCAL 20-VAL CONJ VACC 0.5 ML IM SUSY
0.5000 mL | PREFILLED_SYRINGE | Freq: Once | INTRAMUSCULAR | Status: AC
  Administered 2023-07-04: 0.5 mL via INTRAMUSCULAR
  Filled 2023-07-04: qty 0.5

## 2023-07-04 NOTE — Progress Notes (Signed)
Mobility Specialist - Progress Note   07/04/23 0841  Mobility  Activity Ambulated with assistance in hallway;Stood at bedside;Dangled on edge of bed;Ambulated with assistance to bathroom  Level of Assistance Standby assist, set-up cues, supervision of patient - no hands on  Assistive Device Front wheel walker  Distance Ambulated (ft) 140 ft  Activity Response Tolerated well  Mobility Referral Yes  $Mobility charge 1 Mobility  Mobility Specialist Start Time (ACUTE ONLY) 0817  Mobility Specialist Stop Time (ACUTE ONLY) 0834  Mobility Specialist Time Calculation (min) (ACUTE ONLY) 17 min   Pt supine in bed on RA upon arrival. Pt STS and ambulates to/from bathroom, in hallway, and around room SBA with no LOB noted. Pt left in recliner with needs in reach and chair alarm activated.   Terrilyn Saver  Mobility Specialist  07/04/23 8:43 AM

## 2023-07-04 NOTE — TOC Transition Note (Signed)
Transition of Care [facility] - CM/SW Discharge Note   Patient Details  Name: Elizabeth [name] MRN: [id number] Date of Birth: 04-Apr-1951  [facility]) CM/SW Contact:  Allena Katz, LCSW Phone Number: 07/04/2023, 1:38 PM   Clinical Narrative:   Pt discharging home. Pt declines HH. No TOC needs.           Patient Goals and CMS Choice      Discharge Placement                         Discharge Plan and Services Additional resources added to the After Visit Summary for                                       Social Determinants of Health (SDOH) Interventions SDOH Screenings   Food Insecurity: No Food Insecurity (07/02/2023)  Housing: Low Risk  (07/02/2023)  Transportation Needs: No Transportation Needs (07/02/2023)  Utilities: Not At Risk (07/02/2023)  Tobacco Use: Low Risk  (07/02/2023)     Readmission Risk Interventions     No data to display

## 2023-07-04 NOTE — Procedures (Signed)
Routine EEG Report  Elizabeth [name] is a 72 y.o. female with a history of syncope who is undergoing an EEG to evaluate for seizures.  Report: This EEG was acquired with electrodes placed according to the International 10-20 electrode system (including Fp1, Fp2, F3, F4, C3, C4, P3, P4, O1, O2, T3, T4, T5, T6, A1, A2, Fz, Cz, Pz). The following electrodes were missing or displaced: none.  The occipital dominant rhythm was 9 Hz. This activity is reactive to stimulation. Drowsiness was manifested by background fragmentation; deeper stages of sleep were identified by K complexes and sleep spindles. There was no focal slowing. There were no interictal epileptiform discharges. There were no electrographic seizures identified. There was no abnormal response to photic stimulation or hyperventilation.   Impression: This EEG was obtained while awake and asleep and is normal.    Clinical Correlation: Normal EEGs, however, do not rule out epilepsy.  Bing Neighbors, MD [facility] [phone]  If 7pm- 7am, please page neurology on call as listed in [facility].

## 2023-07-04 NOTE — Discharge Summary (Addendum)
Physician Discharge Summary   Patient: Elizabeth [name] MRN: [id number] DOB: 06-12-1951  Admit date:     07/02/2023  Discharge date: 07/04/23  Discharge Physician: Arnetha Courser   PCP: Leanna Sato, MD   Recommendations at discharge:  Please obtain CBC and BMP on follow-up Follow-up with neurology for recent stroke Follow-up with cardiology for new diagnosis of A-fib Follow-up with primary care provider  Discharge Diagnoses: Principal Problem:   Syncope and collapse Active Problems:   QT prolongation   PAF (paroxysmal atrial fibrillation) (HCC)   CVA (cerebral vascular accident) (HCC)   Acute renal failure superimposed on stage 3b chronic kidney disease (HCC)   Type 2 diabetes mellitus with peripheral vascular disease (HCC)   Leg edema   HLD (hyperlipidemia)   Essential hypertension   UTI (urinary tract infection)   Depression   Obesity (BMI 30-39.9)   Hospital Course: Taken from H&P.   Elizabeth [name] is a 72 y.o. female with medical history significant of HTN, HLD, DM, PVD, stroke, PAF not on AC, stroke, depression, CKD-3b, psoriasis, chronic venous insufficiency, breast cancer (s/p of right lumpectomy and radiation therapy), obesity, s/p of Roux-en-Y, who presents with syncope.  Patient was walking to the bathroom when she passed out for few seconds, husband noticed some shaking but no confusion immediately afterwards.  Patient denies any dizziness.  No focal deficit.  She was recently treated for UTI with doxycycline.  EKG done in the ED with A-fib.  No prior diagnosis, patient has similarly appear EKG in April 2022 but multiple cardiologist reviewed that and it was found to be artifact not A-fib.  Prolonged QTc at 575. Heart rate controlled, COVID, flu and RSV negative.  Worsening renal function. Chest x-ray showed bronchitic change. CT head and C-spine negative for any acute abnormality.  Patient was given magnesium replacement for magnesium of 1.8 with concern of  prolonged QTc.  8/25: Vital stable, TSH normal, creatinine with some improvement 1.47, likely her new baseline as it was 1.95-month ago.UA with trace leukocytes and rare bacteria.  BNP elevated at 670. Repeat EKG remained in rate controlled atrial fibrillation, MRI came back positive for a small cerebellar stroke, less likely the cause of the syncopal episode. Neurology ordered MRA of head and carotid ultrasound to Complete the workup. Echocardiogram Cardiology and neurology was consulted. Starting her on Eliquis as CHA2DS2-VASc of 6, patient is already on Plavix at home and need to have a discussion with her cardiologist about the need of continuing Plavix with Eliquis. PT/OT are recommending home health.  8/26: Hemodynamically stable, remained borderline bradycardic.  Carotid ultrasound without any significant stenosis.  Echocardiogram with normal EF, indeterminate diastolic parameters and no regional wall motion abnormalities.  MRA brain with near occlusion in the distal right P2, with moderate stenosis in the proximal right P2.  Multifocal moderate stenosis in the anterior right M2 and mild stenosis in the left supraclinoid ICA, A1c 6.5, lipid panel normal with LDL of 28. EEG was without any concern of seizure.  Her home Plavix was discontinued after discussing with cardiology to decrease the risk of bleeding..  Patient will continue on current medications and need to have a close follow-up with her providers for further recommendations.  Assessment and Plan: * Syncope and collapse Likely vasovagal but arrhythmia cannot be ruled out at this time.  Concern of paroxysmal atrial fibrillation.  Also found to have a small cerebellar stroke on MRI brain but less likely the cause of this episode. Patient completed  the workup, Plavix was discontinued as recommended by cardiology and she was started on Eliquis.   QT prolongation Improved on repeat EKG today. -Continue to monitor   PAF  (paroxysmal atrial fibrillation) (HCC) Heart rate remained well-controlled.  1 prior EKG in 2022 with A-fib.  Likely paroxysmal atrial fibrillation, not on any anticoagulation.  CHA2DS2-VASc of 6. TSH normal She was started on Eliquis and Plavix was discontinued. Echocardiogram without any significant abnormality.  CVA (cerebral vascular accident) (HCC) Initial CT head was negative but MRI brain with a small cerebellar stroke.  No deficit Neurology was consulted to complete the stroke workup Likely embolic in phenomenon.  Complete the workup. Patient is being discharged on Eliquis , home Plavix was discontinued  Acute renal failure superimposed on stage 3b chronic kidney disease (HCC) Creatinine improved to 1.47 today.  Appears to be at baseline now. -Monitor renal function -Avoid nephrotoxins -Patient will resume home medications on discharge  Type 2 diabetes mellitus with peripheral vascular disease (HCC) Patient seems to have well-controlled diabetes, recent A1c of 6.4.  Takes Tradjenta at home. -Repeat A1c 6.5 -Continue with SSI  Leg edema Patient with 1+ lower extremity edema with chronic venous stasis.  Prior echo done in 2022 was normal.  Elevated BNP at 670 but no symptoms of heart failure. -Repeat echocardiogram pending -Currently holding Lasix due to concern of AKI  HLD (hyperlipidemia) -Continue statin  Essential hypertension Blood pressure within goal. -Home lisinopril and Lasix was initially held for concern of AKI, keep holding for another day for permissive hypertension  UTI (urinary tract infection) Patient recently was given 10 days of doxycycline for concern of UTI, no current symptoms and recently completed the course. -Stop doxycycline  Depression Home Lexapro was held for initial prolonged QTc  Obesity (BMI 30-39.9) Estimated body mass index is 37.3 kg/m as calculated from the following:   Height as of this encounter: 5\' 3"  (1.6 m).   Weight as of  this encounter: 95.5 kg.   -This will complicate overall prognosis -Encourage weight loss   Consultants: Cardiology.  Neurology Procedures performed: EEG Disposition: Home health Diet recommendation:  Discharge Diet Orders (From admission, onward)     Start     Ordered   07/04/23 0000  Diet - low sodium heart healthy        07/04/23 1331           Cardiac and Carb modified diet DISCHARGE MEDICATION: Allergies as of 07/04/2023   No Known Allergies      Medication List     STOP taking these medications    clopidogrel 75 MG tablet Commonly known as: PLAVIX   doxycycline 100 MG capsule Commonly known as: VIBRAMYCIN   escitalopram 10 MG tablet Commonly known as: LEXAPRO   ferrous gluconate 324 MG tablet Commonly known as: FERGON       TAKE these medications    acetaminophen 325 MG tablet Commonly known as: TYLENOL Take 2 tablets (650 mg total) by mouth every 6 (six) hours as needed for mild pain (or Fever >/= 101).   apixaban 5 MG Tabs tablet Commonly known as: ELIQUIS Take 1 tablet (5 mg total) by mouth 2 (two) times daily.   CALCIUM 1000 + D PO Take 1 tablet by mouth daily.   furosemide 20 MG tablet Commonly known as: LASIX Take 20 mg by mouth daily.   gabapentin 300 MG capsule Commonly known as: NEURONTIN Take 300 mg by mouth daily.   linagliptin 5 MG Tabs tablet  Commonly known as: TRADJENTA Take 5 mg by mouth daily.   lisinopril 20 MG tablet Commonly known as: ZESTRIL Take 20 mg by mouth daily.   potassium chloride 10 MEQ tablet Commonly known as: KLOR-CON Take 10 mEq by mouth daily.   pravastatin 40 MG tablet Commonly known as: PRAVACHOL Take 40 mg by mouth daily. In afternoon   Premarin vaginal cream Generic drug: conjugated estrogens Estrogen Cream Instruction Discard applicator Apply pea sized amount to tip of finger to urethra before bed. Wash hands well after application. Use Monday, Wednesday and Friday   True Metrix Blood  Glucose Test test strip Generic drug: glucose blood   TRUEplus Lancets 33G Misc        Follow-up Information     Leanna Sato, MD. Schedule an appointment as soon as possible for a visit in 1 week(s).   Specialty: Family Medicine Contact information: [address] [place] [address] [phone]         Lonell Face, MD. Schedule an appointment as soon as possible for a visit.   Specialty: Neurology Contact information: [phone] [facility] [place] [address] [phone]         Debbe Odea, MD. Schedule an appointment as soon as possible for a visit in 1 week(s).   Specialties: Cardiology, Radiology Contact information: [address] [place] [address] [phone]                Discharge Exam: Ceasar Mons Weights   07/02/23 2013 07/03/23 0500 07/04/23 0500  Weight: 94.3 kg 95.5 kg 92.8 kg   General.  Frail elderly lady, in no acute distress. Pulmonary.  Lungs clear bilaterally, normal respiratory effort. CV.  Regular rate and rhythm, no JVD, rub or murmur. Abdomen.  Soft, nontender, nondistended, BS positive. CNS.  Alert and oriented .  No focal neurologic deficit. Extremities.  No edema, no cyanosis, pulses intact and symmetrical. Psychiatry.  Judgment and insight appears normal.   Condition at discharge: stable  The results of significant diagnostics from this hospitalization (including imaging, microbiology, ancillary and laboratory) are listed below for reference.   Imaging Studies: MR ANGIO HEAD WO CONTRAST  Result Date: 07/03/2023 CLINICAL DATA:  Left cerebellar infarct on same-day MRI EXAM: MRA HEAD WITHOUT CONTRAST TECHNIQUE: Angiographic images of the Circle of Willis were acquired using MRA technique without intravenous contrast. COMPARISON:  No prior MRA available, correlation is made with 07/03/2023 MRI head FINDINGS: Anterior circulation: Both internal carotid arteries are  patent to the termini, with mild stenosis in the left supraclinoid ICA. A1 segments patent. Normal anterior communicating artery. Anterior cerebral arteries are patent to their distal aspects without significant stenosis. No M1 stenosis or occlusion. Multifocal moderate stenosis in the anterior right M2 (series 5, images 99, 100, and 105); this vessel's branches are diminutive, likely because they supply an area affected by remote infarct. Distal MCA branches are otherwise perfused to their distal aspects without significant stenosis. Posterior circulation: Vertebral arteries patent to the vertebrobasilar junction without stenosis. Left posterior inferior cerebral arteries patent. The right PICA is not definitively seen. Basilar patent to its distal aspect. Superior cerebellar arteries patent proximally. Patent P1 segments. Moderate stenosis in the proximal right P 2 (series 5, image 82) and with near occlusion in the distal right P2 (series 5, image 87). The left PCA branches are perfused to their distal aspects without significant stenosis. The bilateral posterior communicating arteries are patent. Anatomic variants: None significant IMPRESSION: 1. Near  occlusion in the distal right P2, with moderate stenosis in the proximal right P2. 2. Multifocal moderate stenosis in the anterior right M2. 3. Mild stenosis in the left supraclinoid ICA. Electronically Signed   By: Wiliam Ke M.D.   On: 07/03/2023 22:44   ECHOCARDIOGRAM COMPLETE  Result Date: 07/03/2023    ECHOCARDIOGRAM REPORT   Patient Name:   WRENNA GROAH Date of Exam: 07/03/2023 Medical Rec #:  [id number]        Height:       63.0 in Accession #:    [id number]       Weight:       210.5 lb Date of Birth:  1951/04/25       BSA:          1.976 m Patient Age:    71 years         BP:           153/72 mmHg Patient Gender: F                HR:           53 bpm. Exam Location:  [facility] Procedure: 2D Echo and Intracardiac Opacification Agent Indications:      Syncope R55  History:         Patient has prior history of Echocardiogram examinations, most                  recent 04/28/2021.  Sonographer:     Overton Mam RDCS, FASE Referring Phys:  [id number] Tran Arzuaga Diagnosing Phys: Dietrich Pates MD  Sonographer Comments: Technically difficult study due to poor echo windows, patient is obese and suboptimal apical window. Image acquisition challenging due to patient body habitus. IMPRESSIONS  1. Left ventricular ejection fraction, by estimation, is 55 to 60%. The left ventricle has normal function. The left ventricle has no regional wall motion abnormalities. Left ventricular diastolic parameters are indeterminate.  2. Right ventricular systolic function is normal. The right ventricular size is mildly enlarged.  3. The mitral valve is normal in structure. Mild mitral valve regurgitation.  4. The aortic valve is tricuspid. Aortic valve regurgitation is not visualized.  5. The inferior vena cava is normal in size with <50% respiratory variability, suggesting right atrial pressure of 8 mmHg. Comparison(s): The left ventricular function is unchanged. FINDINGS  Left Ventricle: Left ventricular ejection fraction, by estimation, is 55 to 60%. The left ventricle has normal function. The left ventricle has no regional wall motion abnormalities. Definity contrast agent was given IV to delineate the left ventricular  endocardial borders. The left ventricular internal cavity size was normal in size. There is no left ventricular hypertrophy. Left ventricular diastolic parameters are indeterminate. Right Ventricle: The right ventricular size is mildly enlarged. Right vetricular wall thickness was not assessed. Right ventricular systolic function is normal. Left Atrium: Left atrial size was normal in size. Right Atrium: Right atrial size was normal in size. Pericardium: Trivial pericardial effusion is present. Mitral Valve: The mitral valve is normal in structure. Mild mitral annular  calcification. Mild mitral valve regurgitation. Tricuspid Valve: The tricuspid valve is normal in structure. Tricuspid valve regurgitation is mild. Aortic Valve: The aortic valve is tricuspid. Aortic valve regurgitation is not visualized. Aortic valve peak gradient measures 6.8 mmHg. Pulmonic Valve: The pulmonic valve was normal in structure. Pulmonic valve regurgitation is not visualized. Aorta: The aortic root and ascending aorta are structurally normal, with no evidence of dilitation. Venous: The inferior vena cava is  normal in size with less than 50% respiratory variability, suggesting right atrial pressure of 8 mmHg. IAS/Shunts: No atrial level shunt detected by color flow Doppler.  LEFT VENTRICLE PLAX 2D LVIDd:         4.80 cm     Diastology LVIDs:         3.40 cm     LV e' lateral:   8.81 cm/s LV PW:         1.10 cm     LV E/e' lateral: 10.5 LV IVS:        1.00 cm LVOT diam:     1.90 cm LV SV:         55 LV SV Index:   28 LVOT Area:     2.84 cm  LV Volumes (MOD) LV vol d, MOD A2C: 74.7 ml LV vol d, MOD A4C: 77.8 ml LV vol s, MOD A2C: 28.1 ml LV vol s, MOD A4C: 33.0 ml LV SV MOD A2C:     46.6 ml LV SV MOD A4C:     77.8 ml LV SV MOD BP:      46.3 ml LEFT ATRIUM             Index LA diam:        3.90 cm 1.97 cm/m LA Vol (A2C):   36.7 ml 18.57 ml/m LA Vol (A4C):   36.4 ml 18.42 ml/m LA Biplane Vol: 37.0 ml 18.72 ml/m  AORTIC VALVE AV Area (Vmax): 1.85 cm AV Vmax:        130.00 cm/s AV Peak Grad:   6.8 mmHg LVOT Vmax:      84.60 cm/s LVOT Vmean:     51.000 cm/s LVOT VTI:       0.194 m  AORTA Ao Root diam: 3.40 cm Ao Asc diam:  3.30 cm MITRAL VALVE               TRICUSPID VALVE MV Area (PHT): 3.66 cm    TR Peak grad:   24.8 mmHg MV Decel Time: 207 msec    TR Vmax:        249.00 cm/s MV E velocity: 92.80 cm/s MV A velocity: 23.60 cm/s  SHUNTS MV E/A ratio:  3.93        Systemic VTI:  0.19 m                            Systemic Diam: 1.90 cm Dietrich Pates MD Electronically signed by Dietrich Pates MD Signature  Date/Time: 07/03/2023/6:13:21 PM    Final    US Carotid Bilateral  Result Date: 07/03/2023 CLINICAL DATA:  Stroke. Syncopal episode. History of hypertension, hyperlipidemia and diabetes. EXAM: BILATERAL CAROTID DUPLEX ULTRASOUND TECHNIQUE: Wallace Cullens scale imaging, color Doppler and duplex ultrasound were performed of bilateral carotid and vertebral arteries in the neck. COMPARISON:  Carotid Doppler ultrasound-04/27/2021 FINDINGS: Criteria: Quantification of carotid stenosis is based on velocity parameters that correlate the residual internal carotid diameter with NASCET-based stenosis levels, using the diameter of the distal internal carotid lumen as the denominator for stenosis measurement. The following velocity measurements were obtained: RIGHT ICA: 61/19 cm/sec CCA: 60/16 cm/sec SYSTOLIC ICA/CCA RATIO:  1.0 ECA: 60 cm/sec LEFT ICA: 64/16 cm/sec CCA: 83/18 cm/sec SYSTOLIC ICA/CCA RATIO:  1.0 ECA: 79 cm/sec RIGHT CAROTID ARTERY: There is a minimal to moderate amount of eccentric echogenic plaque within the right carotid bulb (images 5 and 6) and 18), extending to involve the origin  and proximal aspect of the right internal carotid artery (image 24), morphologically similar to the 04/2021 examination and again not resulting in elevated peak systolic velocities within the interrogated course of the right internal carotid artery to suggest a hemodynamically significant stenosis. RIGHT VERTEBRAL ARTERY:  Antegrade flow LEFT CAROTID ARTERY: There is a minimal amount of eccentric echogenic plaque within left carotid bulb (image 54), morphologically similar to the 04/2021 examination and again not resulting in elevated peak systolic velocities within the interrogated course of the left internal carotid artery to suggest a hemodynamically significant stenosis. LEFT VERTEBRAL ARTERY:  Antegrade flow IMPRESSION: Minimal to moderate amount of bilateral atherosclerotic plaque, right-greater-than-left, morphologically similar to  the 04/2021 examination and again not resulting in a hemodynamically significant stenosis within either internal carotid artery. Electronically Signed   By: Simonne Come M.D.   On: 07/03/2023 17:24   MR BRAIN WO CONTRAST  Result Date: 07/03/2023 CLINICAL DATA:  Syncope/presyncope, cerebrovascular cause suspected EXAM: MRI HEAD WITHOUT CONTRAST TECHNIQUE: Multiplanar, multiecho pulse sequences of the brain and surrounding structures were obtained without intravenous contrast. COMPARISON:  04/27/2021 MRI head, correlation is also made with 07/02/2023 CT head FINDINGS: Brain: Restricted diffusion with mild ADC correlate in the left cerebellum (series 9, image 8), measuring up to 10 mm (series 7, image 13). No acute hemorrhage, mass, mass effect, or midline shift. No hydrocephalus or extra-axial collection. No hemosiderin deposition to suggest remote hemorrhage. Redemonstrated encephalomalacia in the right anterior MCA territory, consistent with remote infarct. Wallerian degeneration is noted extending the right corticospinal tracts. Additional remote infarct right occipital lobe. Small lacunar infarcts in the bilateral cerebellar hemispheres, thalami, basal ganglia. T2 hyperintense signal in the periventricular white matter and pons, likely the sequela of chronic small vessel ischemic disease. Vascular: Normal arterial flow voids. Skull and upper cervical spine: Normal marrow signal. Sinuses/Orbits: Clear paranasal sinuses. Redemonstrated small left globe, with linear T2 hypointense signal and incomplete suppression on FLAIR. Status post right lens replacement. Otherwise unremarkable right orbit. Other: The mastoid air cells are well aerated. IMPRESSION: Small acute infarct in the left cerebellum. These results will be called to the ordering clinician or representative by the Radiologist Assistant, and communication documented in the PACS or [employer]. Electronically Signed   By: Wiliam Ke M.D.   On:  07/03/2023 03:28   CT HEAD WO CONTRAST  Result Date: 07/02/2023 CLINICAL DATA:  Syncope, fall, seizure like act EXAM: CT HEAD WITHOUT CONTRAST CT CERVICAL SPINE WITHOUT CONTRAST TECHNIQUE: Multidetector CT imaging of the head and cervical spine was performed following the standard protocol without intravenous contrast. Multiplanar CT image reconstructions of the cervical spine were also generated. RADIATION DOSE REDUCTION: This exam was performed according to the departmental dose-optimization program which includes automated exposure control, adjustment of the mA and/or kV according to patient size and/or use of iterative reconstruction technique. COMPARISON:  04/26/2021 CT head, no prior CT cervical spine FINDINGS: CT HEAD FINDINGS Brain: No evidence of acute infarct, hemorrhage, mass, mass effect, or midline shift. No hydrocephalus or extra-axial fluid collection. Redemonstrated encephalomalacia in the right frontal lobe and right occipital lobe, likely sequela of remote infarcts. Vascular: No hyperdense vessel. Atherosclerotic calcifications in the intracranial carotid and vertebral arteries. Skull: Negative for fracture or focal lesion. Sinuses/Orbits: Mucosal thickening in the ethmoid air cells. Status post bilateral lens replacements. Other: The mastoid air cells are well aerated. CT CERVICAL SPINE FINDINGS Alignment: No traumatic listhesis. Straightening and mild reversal of the normal cervical lordosis. Skull base and vertebrae:  No acute fracture or suspicious osseous lesion. Fusion of C2 and C3, likely congenital. Soft tissues and spinal canal: No prevertebral fluid or swelling. No visible canal hematoma. Disc levels: Degenerative changes in the cervical spine. No significant spinal canal stenosis. Upper chest: Negative. IMPRESSION: 1. No acute intracranial process. 2. No acute fracture or traumatic listhesis in the cervical spine. Electronically Signed   By: Wiliam Ke M.D.   On: 07/02/2023 21:11    CT Cervical Spine Wo Contrast  Result Date: 07/02/2023 CLINICAL DATA:  Syncope, fall, seizure like act EXAM: CT HEAD WITHOUT CONTRAST CT CERVICAL SPINE WITHOUT CONTRAST TECHNIQUE: Multidetector CT imaging of the head and cervical spine was performed following the standard protocol without intravenous contrast. Multiplanar CT image reconstructions of the cervical spine were also generated. RADIATION DOSE REDUCTION: This exam was performed according to the departmental dose-optimization program which includes automated exposure control, adjustment of the mA and/or kV according to patient size and/or use of iterative reconstruction technique. COMPARISON:  04/26/2021 CT head, no prior CT cervical spine FINDINGS: CT HEAD FINDINGS Brain: No evidence of acute infarct, hemorrhage, mass, mass effect, or midline shift. No hydrocephalus or extra-axial fluid collection. Redemonstrated encephalomalacia in the right frontal lobe and right occipital lobe, likely sequela of remote infarcts. Vascular: No hyperdense vessel. Atherosclerotic calcifications in the intracranial carotid and vertebral arteries. Skull: Negative for fracture or focal lesion. Sinuses/Orbits: Mucosal thickening in the ethmoid air cells. Status post bilateral lens replacements. Other: The mastoid air cells are well aerated. CT CERVICAL SPINE FINDINGS Alignment: No traumatic listhesis. Straightening and mild reversal of the normal cervical lordosis. Skull base and vertebrae: No acute fracture or suspicious osseous lesion. Fusion of C2 and C3, likely congenital. Soft tissues and spinal canal: No prevertebral fluid or swelling. No visible canal hematoma. Disc levels: Degenerative changes in the cervical spine. No significant spinal canal stenosis. Upper chest: Negative. IMPRESSION: 1. No acute intracranial process. 2. No acute fracture or traumatic listhesis in the cervical spine. Electronically Signed   By: Wiliam Ke M.D.   On: 07/02/2023 21:11   DG  Chest Portable 1 View  Result Date: 07/02/2023 CLINICAL DATA:  Shortness of breath with cough and congestion EXAM: PORTABLE CHEST 1 VIEW COMPARISON:  06/05/2022 FINDINGS: Upper normal heart size. Mildly indistinct pulmonary vasculature, pulmonary venous hypertension is not excluded. Airway thickening is present, suggesting bronchitis or reactive airways disease. No airspace opacity identified. No blunting of the costophrenic angles. No significant bony abnormality is observed. IMPRESSION: 1. Airway thickening is present, suggesting bronchitis or reactive airways disease. 2. Mildly indistinct pulmonary vasculature, pulmonary venous hypertension is not excluded. Upper normal heart size. Electronically Signed   By: Gaylyn Rong M.D.   On: 07/02/2023 20:58    Microbiology: Results for orders placed or performed during the hospital encounter of 07/02/23  Resp panel by RT-PCR (RSV, Flu A&B, Covid) Anterior Nasal Swab     Status: None   Collection Time: 07/02/23  8:50 PM   Specimen: Anterior Nasal Swab  Result Value Ref Range Status   SARS Coronavirus 2 by RT PCR NEGATIVE NEGATIVE Final    Comment: (NOTE) SARS-CoV-2 target nucleic acids are NOT DETECTED.  The SARS-CoV-2 RNA is generally detectable in upper respiratory specimens during the acute phase of infection. The lowest concentration of SARS-CoV-2 viral copies this assay can detect is 138 copies/mL. A negative result does not preclude SARS-Cov-2 infection and should not be used as the sole basis for treatment or other patient management decisions. A  negative result may occur with  improper specimen collection/handling, submission of specimen other than nasopharyngeal swab, presence of viral mutation(s) within the areas targeted by this assay, and inadequate number of viral copies(<138 copies/mL). A negative result must be combined with clinical observations, patient history, and epidemiological information. The expected result is  Negative.  Fact Sheet for Patients:  BloggerCourse.com  Fact Sheet for Healthcare Providers:  SeriousBroker.it  This test is no t yet approved or cleared by the [place] FDA and  has been authorized for detection and/or diagnosis of SARS-CoV-2 by FDA under an Emergency Use Authorization (EUA). This EUA will remain  in effect (meaning this test can be used) for the duration of the COVID-19 declaration under Section 564(b)(1) of the Act, 21 U.S.C.section 360bbb-3(b)(1), unless the authorization is terminated  or revoked sooner.       Influenza A by PCR NEGATIVE NEGATIVE Final   Influenza B by PCR NEGATIVE NEGATIVE Final    Comment: (NOTE) The Xpert Xpress SARS-CoV-2/FLU/RSV plus assay is intended as an aid in the diagnosis of influenza from Nasopharyngeal swab specimens and should not be used as a sole basis for treatment. Nasal washings and aspirates are unacceptable for Xpert Xpress SARS-CoV-2/FLU/RSV testing.  Fact Sheet for Patients: BloggerCourse.com  Fact Sheet for Healthcare Providers: SeriousBroker.it  This test is not yet approved or cleared by the [place] FDA and has been authorized for detection and/or diagnosis of SARS-CoV-2 by FDA under an Emergency Use Authorization (EUA). This EUA will remain in effect (meaning this test can be used) for the duration of the COVID-19 declaration under Section 564(b)(1) of the Act, 21 U.S.C. section 360bbb-3(b)(1), unless the authorization is terminated or revoked.     Resp Syncytial Virus by PCR NEGATIVE NEGATIVE Final    Comment: (NOTE) Fact Sheet for Patients: BloggerCourse.com  Fact Sheet for Healthcare Providers: SeriousBroker.it  This test is not yet approved or cleared by the [place] FDA and has been authorized for detection and/or diagnosis of  SARS-CoV-2 by FDA under an Emergency Use Authorization (EUA). This EUA will remain in effect (meaning this test can be used) for the duration of the COVID-19 declaration under Section 564(b)(1) of the Act, 21 U.S.C. section 360bbb-3(b)(1), unless the authorization is terminated or revoked.  Performed at [facility], [address] [place], [place] [address]     Labs: CBC: Recent Labs  Lab 07/02/23 2016 07/03/23 0443  WBC 7.6 9.0  HGB 12.7 12.5  HCT 39.6 38.2  MCV 89.0 88.2  PLT 191 208   Basic Metabolic Panel: Recent Labs  Lab 07/02/23 2016 07/03/23 0443 07/04/23 0405  NA 139 137 135  K 4.2 3.6 3.9  CL 105 106 104  CO2 23 23 24   GLUCOSE 141* 128* 108*  BUN 23 22 25*  CREATININE 1.58* 1.47* 1.47*  CALCIUM 8.7* 8.6* 8.2*  MG 1.8  --  2.0   Liver Function Tests: No results for input(s): "AST", "ALT", "ALKPHOS", "BILITOT", "PROT", "ALBUMIN" in the last 168 hours. CBG: Recent Labs  Lab 07/03/23 1145 07/03/23 1733 07/03/23 2046 07/04/23 0810 07/04/23 1220  GLUCAP 146* 87 198* 101* 119*    Discharge time spent: greater than 30 minutes.  This record has been created using Conservation officer, historic buildings. Errors have been sought and corrected,but may not always be located. Such creation errors do not reflect on the standard of care.   Signed: Arnetha Courser, MD [facility] 07/04/2023

## 2023-07-04 NOTE — Progress Notes (Signed)
Patient being discharged home. PIV removed. Went over discharge instructions with patient. Patient stated that she understood and all questions were answered. Patient going home POV with husband.

## 2023-07-04 NOTE — TOC Progression Note (Signed)
Transition of Care [facility]) - Progression Note    Patient Details  Name: [name] MRN: [id number] Date of Birth: August 13, 1951  Transition of Care [facility]) CM/SW Contact  Allena Katz, LCSW Phone Number: 07/04/2023, 9:51 AM  Clinical Narrative:   CSW spoke with pt who reports that she does not want home health at this time and does not feel like she needs anything additionally to return home.          Expected Discharge Plan and Services                                               Social Determinants of Health (SDOH) Interventions SDOH Screenings   Food Insecurity: No Food Insecurity (07/02/2023)  Housing: Low Risk  (07/02/2023)  Transportation Needs: No Transportation Needs (07/02/2023)  Utilities: Not At Risk (07/02/2023)  Tobacco Use: Low Risk  (07/02/2023)    Readmission Risk Interventions     No data to display

## 2023-07-04 NOTE — Progress Notes (Signed)
Eeg done 

## 2023-07-04 NOTE — Progress Notes (Signed)
Progress Note  Patient Name: Elizabeth [name] Date of Encounter: 07/04/2023  Primary Cardiologist: Agbor-Etang  Subjective   No acute overnight cardiac events.   Inpatient Medications    Scheduled Meds:  apixaban  5 mg Oral BID   calcium-vitamin D  1 tablet Oral Daily   gabapentin  300 mg Oral Daily   insulin aspart  0-5 Units Subcutaneous QHS   insulin aspart  0-9 Units Subcutaneous TID WC   [START ON 07/05/2023] pneumococcal 20-valent conjugate vaccine  0.5 mL Intramuscular Tomorrow-1000   pravastatin  40 mg Oral q1600   Continuous Infusions:  PRN Meds: acetaminophen, albuterol, dextromethorphan-guaiFENesin, diphenhydrAMINE, hydrALAZINE, LORazepam   Vital Signs    Vitals:   07/04/23 0126 07/04/23 0500 07/04/23 0505 07/04/23 0810  BP: 107/60  123/69 (!) 121/55  Pulse: (!) 59  (!) 56 64  Resp:    16  Temp: 98.2 F (36.8 C)  98.3 F (36.8 C) 98.7 F (37.1 C)  TempSrc: Oral  Oral Oral  SpO2: 97%  95% 97%  Weight:  92.8 kg    Height:        Intake/Output Summary (Last 24 hours) at 07/04/2023 1100 Last data filed at 07/03/2023 1939 Gross per 24 hour  Intake 360 ml  Output --  Net 360 ml   Filed Weights   07/02/23 2013 07/03/23 0500 07/04/23 0500  Weight: 94.3 kg 95.5 kg 92.8 kg    Telemetry    Afib with slow ventricular response, 40s to 50s bpm, longest R-R interval 2.5 seconds - Personally Reviewed  ECG    No new tracings - Personally Reviewed  Physical Exam   GEN: No acute distress.   Neck: No JVD. Cardiac: Bradycardic, IRIR, no murmurs, rubs, or gallops.  Respiratory: Clear to auscultation bilaterally.  GI: Soft, nontender, non-distended.   MS: No edema; No deformity. Neuro:  Alert and oriented x 3; Nonfocal.  Psych: Normal affect.  Labs    Chemistry Recent Labs  Lab 07/02/23 2016 07/03/23 0443 07/04/23 0405  NA 139 137 135  K 4.2 3.6 3.9  CL 105 106 104  CO2 23 23 24   GLUCOSE 141* 128* 108*  BUN 23 22 25*  CREATININE 1.58* 1.47*  1.47*  CALCIUM 8.7* 8.6* 8.2*  GFRNONAA 35* 38* 38*  ANIONGAP 11 8 7      Hematology Recent Labs  Lab 07/02/23 2016 07/03/23 0443  WBC 7.6 9.0  RBC 4.45 4.33  HGB 12.7 12.5  HCT 39.6 38.2  MCV 89.0 88.2  MCH 28.5 28.9  MCHC 32.1 32.7  RDW 13.6 13.5  PLT 191 208    Cardiac EnzymesNo results for input(s): "TROPONINI" in the last 168 hours. No results for input(s): "TROPIPOC" in the last 168 hours.   BNP Recent Labs  Lab 07/02/23 2016  BNP 670.1*     DDimer No results for input(s): "DDIMER" in the last 168 hours.   Radiology    MR ANGIO HEAD WO CONTRAST  Result Date: 07/03/2023 IMPRESSION: 1. Near occlusion in the distal right P2, with moderate stenosis in the proximal right P2. 2. Multifocal moderate stenosis in the anterior right M2. 3. Mild stenosis in the left supraclinoid ICA. Electronically Signed   By: Wiliam Ke M.D.   On: 07/03/2023 22:44   US Carotid Bilateral  Result Date: 07/03/2023 IMPRESSION: Minimal to moderate amount of bilateral atherosclerotic plaque, right-greater-than-left, morphologically similar to the 04/2021 examination and again not resulting in a hemodynamically significant stenosis within either internal carotid artery.  Electronically Signed   By: Simonne Come M.D.   On: 07/03/2023 17:24   MR BRAIN WO CONTRAST  Result Date: 07/03/2023 IMPRESSION: Small acute infarct in the left cerebellum. These results will be called to the ordering clinician or representative by the Radiologist Assistant, and communication documented in the PACS or [employer]. Electronically Signed   By: Wiliam Ke M.D.   On: 07/03/2023 03:28   CT HEAD WO CONTRAST  Result Date: 07/02/2023 IMPRESSION: 1. No acute intracranial process. 2. No acute fracture or traumatic listhesis in the cervical spine. Electronically Signed   By: Wiliam Ke M.D.   On: 07/02/2023 21:11   CT Cervical Spine Wo Contrast  Result Date: 07/02/2023 IMPRESSION: 1. No acute intracranial  process. 2. No acute fracture or traumatic listhesis in the cervical spine. Electronically Signed   By: Wiliam Ke M.D.   On: 07/02/2023 21:11   DG Chest Portable 1 View  Result Date: 07/02/2023 IMPRESSION: 1. Airway thickening is present, suggesting bronchitis or reactive airways disease. 2. Mildly indistinct pulmonary vasculature, pulmonary venous hypertension is not excluded. Upper normal heart size. Electronically Signed   By: Gaylyn Rong M.D.   On: 07/02/2023 20:58    Cardiac Studies   2D echo 07/03/2023: 1. Left ventricular ejection fraction, by estimation, is 55 to 60%. The  left ventricle has normal function. The left ventricle has no regional  wall motion abnormalities. Left ventricular diastolic parameters are  indeterminate.   2. Right ventricular systolic function is normal. The right ventricular  size is mildly enlarged.   3. The mitral valve is normal in structure. Mild mitral valve  regurgitation.   4. The aortic valve is tricuspid. Aortic valve regurgitation is not  visualized.   5. The inferior vena cava is normal in size with <50% respiratory  variability, suggesting right atrial pressure of 8 mmHg.   Comparison(s): The left ventricular function is unchanged.   Patient Profile     72 y.o. 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   Assessment & Plan    1. Syncope: -Found to be in newly diagnosed Afib with slow ventricular response with stable BP -Echo without significant structural abnormality  -EEG pending -Orthostatic vital with ventricular rate trend of 53 to 96 bpm (sitting to standing) in Afib, therefore conclusion is difficult  to assess -Query in the setting of infarct  -No indication for PPM -Monitor on tele while admitted, no pauses noted -MRA with significant cerebrovascular disease as above  2. 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 History of CVA with acute cerebellar infarct on MRI brain this admission and cerebrovascular disease: -Now on OAC as above -LDL 28 this  admission -PTA pravastatin   4. Acute on CKD stage III: -Stable  5. PVD: -Followed by vascular surgery -Now on Eliquis in place of Plavix       For questions or updates, please contact [facility] Please consult [email or web address] for contact info under Cardiology/STEMI.    Signed, Eula Listen, PA-C [facility] Pager: [phone] 07/04/2023, 11:00 AM

## 2023-07-08 NOTE — Progress Notes (Signed)
NYDA, CHEESMAN ([id number]) [id number] Page 1 of 8 Visit Report for 06/29/2023 Arrival Information Details Patient Name: Date of Service: Elizabeth [name], Elizabeth [name] [name] 06/29/2023 3:00 PM Medical Record Number: [id number] Patient Account Number: [id number] Date of Birth/Sex: Treating RN: Jun 02, 1951 (72 y.o. [name] Primary Care Elan Mcelvain: Darreld Mclean Other Clinician: Referring Bresha Hosack: Treating Lawsyn Heiler/Extender: Gerrit Halls in Treatment: 4 Visit Information History Since Last Visit Added or deleted any medications: No Patient Arrived: Ambulatory Any new allergies or adverse reactions: No Arrival Time: 15:03 Had a fall or experienced change in No Accompanied By: self activities of daily living that may affect Transfer Assistance: None risk of falls: Patient Identification Verified: Yes Signs or symptoms of abuse/neglect since last visito No Secondary Verification Process Completed: Yes Hospitalized since last visit: No Patient Requires Transmission-Based Precautions: No Implantable device outside of the clinic excluding No Patient Has Alerts: Yes cellular tissue based products placed in the center Patient Alerts: Patient on Blood Thinner since last visit: DIABETIC Has Dressing in Place as Prescribed: Yes Pain Present Now: No Electronic Signature(s) Signed: 07/08/2023 11:59:03 AM By: Yevonne Pax RN Entered By: Yevonne Pax on 06/29/2023 12:05:08 -------------------------------------------------------------------------------- Clinic Level of Care Assessment Details Patient Name: Date of Service: Elizabeth [name], Elizabeth [name] [name] 06/29/2023 3:00 PM Medical Record Number: [id number] Patient Account Number: [id number] Date of Birth/Sex: Treating RN: 12-05-1950 (72 y.o. [name] Primary Care Chaylee Ehrsam: Darreld Mclean Other Clinician: Referring Harl Wiechmann: Treating Isair Inabinet/Extender: Gerrit Halls in Treatment:  4 Clinic Level of Care Assessment Items TOOL 4 Quantity Score X- 1 0 Use when only an EandM is performed on FOLLOW-UP visit ASSESSMENTS - Nursing Assessment / Reassessment X- 1 10 Reassessment of Co-morbidities (includes updates in patient status) X- 1 5 Reassessment of Adherence to Treatment Plan Elizabeth [name] ([id number]) [id number] Page 2 of 8 ASSESSMENTS - Wound and Skin A ssessment / Reassessment X - Simple Wound Assessment / Reassessment - one wound 1 5 []  - 0 Complex Wound Assessment / Reassessment - multiple wounds []  - 0 Dermatologic / Skin Assessment (not related to wound area) ASSESSMENTS - Focused Assessment []  - 0 Circumferential Edema Measurements - multi extremities []  - 0 Nutritional Assessment / Counseling / Intervention []  - 0 Lower Extremity Assessment (monofilament, tuning fork, pulses) []  - 0 Peripheral Arterial Disease Assessment (using hand held doppler) ASSESSMENTS - Ostomy and/or Continence Assessment and Care []  - 0 Incontinence Assessment and Management []  - 0 Ostomy Care Assessment and Management (repouching, etc.) PROCESS - Coordination of Care X - Simple Patient / Family Education for ongoing care 1 15 []  - 0 Complex (extensive) Patient / Family Education for ongoing care []  - 0 Staff obtains Chiropractor, Records, T Results / Process Orders est []  - 0 Staff telephones HHA, Nursing Homes / Clarify orders / etc []  - 0 Routine Transfer to another Facility (non-emergent condition) []  - 0 Routine Hospital Admission (non-emergent condition) []  - 0 New Admissions / Manufacturing engineer / Ordering NPWT Apligraf, etc. , []  - 0 Emergency Hospital Admission (emergent condition) X- 1 10 Simple Discharge Coordination []  - 0 Complex (extensive) Discharge Coordination PROCESS - Special Needs []  - 0 Pediatric / Minor Patient Management []  - 0 Isolation Patient Management []  - 0 Hearing / Language / Visual special  needs []  - 0 Assessment of Community assistance (transportation, D/C planning, etc.) []  - 0 Additional assistance / Altered mentation []  - 0 Support Surface(s) Assessment (bed, cushion, seat,  etc.) INTERVENTIONS - Wound Cleansing / Measurement []  - 0 Simple Wound Cleansing - one wound []  - 0 Complex Wound Cleansing - multiple wounds X- 1 5 Wound Imaging (photographs - any number of wounds) []  - 0 Wound Tracing (instead of photographs) []  - 0 Simple Wound Measurement - one wound []  - 0 Complex Wound Measurement - multiple wounds INTERVENTIONS - Wound Dressings []  - 0 Small Wound Dressing one or multiple wounds []  - 0 Medium Wound Dressing one or multiple wounds []  - 0 Large Wound Dressing one or multiple wounds []  - 0 Application of Medications - topical []  - 0 Application of Medications - injection INTERVENTIONS - Miscellaneous []  - 0 External ear exam Elizabeth [name], Elizabeth L ([id number]) [id number] Page 3 of 8 []  - 0 Specimen Collection (cultures, biopsies, blood, body fluids, etc.) []  - 0 Specimen(s) / Culture(s) sent or taken to Lab for analysis []  - 0 Patient Transfer (multiple staff / Michiel Sites Lift / Similar devices) []  - 0 Simple Staple / Suture removal (25 or less) []  - 0 Complex Staple / Suture removal (26 or more) []  - 0 Hypo / Hyperglycemic Management (close monitor of Blood Glucose) []  - 0 Ankle / Brachial Index (ABI) - do not check if billed separately X- 1 5 Vital Signs Has the patient been seen at the hospital within the last three years: Yes Total Score: 55 Level Of Care: New/Established - Level 2 Electronic Signature(s) Signed: 07/08/2023 11:59:03 AM By: Yevonne Pax RN Entered By: Yevonne Pax on 06/29/2023 13:48:13 -------------------------------------------------------------------------------- Encounter Discharge Information Details Patient Name: Date of Service: Elizabeth [name], Elizabeth [name] [name] 06/29/2023 3:00 PM Medical Record  Number: [id number] Patient Account Number: [id number] Date of Birth/Sex: Treating RN: [birth year] (72 y.o. [name] Primary Care Keiron Iodice: Darreld Mclean Other Clinician: Referring Milliani Herrada: Treating Elani Delph/Extender: Gerrit Halls in Treatment: 4 Encounter Discharge Information Items Discharge Condition: Stable Ambulatory Status: Ambulatory Discharge Destination: Home Transportation: Private Auto Accompanied By: self Schedule Follow-up Appointment: Yes Clinical Summary of Care: Electronic Signature(s) Signed: 06/29/2023 4:50:18 PM By: Yevonne Pax RN Entered By: Yevonne Pax on 06/29/2023 13:50:18 -------------------------------------------------------------------------------- Lower Extremity Assessment Details Patient Name: Date of Service: Elizabeth [name], Elizabeth [name] [name] 06/29/2023 3:00 PM Sharman, Vivien L ([id number]) [id number] Page 4 of 8 Medical Record Number: [id number] Patient Account Number: [id number] Date of Birth/Sex: Treating RN: Oct 28, 1951 (72 y.o. [name] Primary Care Ramaya Guile: Darreld Mclean Other Clinician: Referring Weylin Plagge: Treating Brighten Buzzelli/Extender: Gerrit Halls in Treatment: 4 Electronic Signature(s) Signed: 06/29/2023 4:47:00 PM By: Yevonne Pax RN Entered By: Yevonne Pax on 06/29/2023 13:47:00 -------------------------------------------------------------------------------- Multi Wound Chart Details Patient Name: Date of Service: Elizabeth [name], Elizabeth [name] [name] 06/29/2023 3:00 PM Medical Record Number: [id number] Patient Account Number: [id number] Date of Birth/Sex: Treating RN: 1950-12-30 (72 y.o. [name] Primary Care Matie Dimaano: Darreld Mclean Other Clinician: Referring Danis Pembleton: Treating Jolon Degante/Extender: Gerrit Halls in Treatment: 4 Vital Signs Height(in): 63 Pulse(bpm): 51 Weight(lbs): 208 Blood Pressure(mmHg): 98/57 Body Mass Index(BMI):  36.8 Temperature(F): 98.2 Respiratory Rate(breaths/min): 18 [5:Photos: No Photos Right Breast Wound Location: Gradually Appeared Wounding Event: Infection - not elsewhere classified Primary Etiology: Hypertension, Peripheral Venous Comorbid History: Disease, Type II Diabetes, Neuropathy, Seizure Disorder, Received Radiation  12/09/2022 Date Acquired: 4 Weeks of Treatment: Open Wound Status: No Wound Recurrence: 0x0x0 Measurements L x W x D (cm) 0 A (cm) : rea 0 Volume (cm) : 100.00% % Reduction in Area: 100.00% % Reduction in  Volume: Full Thickness Without Exposed  Classification: Support Structures None Present Exudate Amount: None Present (0%) Granulation Amount: None Present (0%) Necrotic Amount: Fascia: No Exposed Structures: Fat Layer (Subcutaneous Tissue): No Tendon: No Muscle: No Joint: No Bone: No Large  (67-100%) Epithelialization:] [N/A:N/A N/A N/A N/A N/A N/A N/A N/A N/A N/A N/A N/A N/A N/A N/A N/A N/A N/A N/A N/A] Treatment Notes Electronic Signature(s) Signed: 06/29/2023 3:13:38 PM By: Jamse Arn ([id number])QI ByGeralyn Corwin DO [id number] Page 5 of 8 Signed: 06/29/2023 3:13:38 Entered By: Geralyn Corwin on 06/29/2023 12:11:59 -------------------------------------------------------------------------------- Multi-Disciplinary Care Plan Details Patient Name: Date of Service: Elizabeth [name], [place] [name] [name] 06/29/2023 3:00 PM Medical Record Number: [id number] Patient Account Number: [id number] Date of Birth/Sex: Treating RN: Apr 01, 1951 (72 y.o. [name] Primary Care Keary Hanak: Darreld Mclean Other Clinician: Referring Algenis Ballin: Treating Elizabeth [name]/Extender: Gerrit Halls in Treatment: 4 Active Inactive Electronic Signature(s) Signed: 06/29/2023 4:48:38 PM By: Yevonne Pax RN Entered By: Yevonne Pax on 06/29/2023 13:48:37 -------------------------------------------------------------------------------- Pain  Assessment Details Patient Name: Date of Service: Elizabeth [name], Elizabeth [name] [name] 06/29/2023 3:00 PM Medical Record Number: [id number] Patient Account Number: [id number] Date of Birth/Sex: Treating RN: [birth year] (72 y.o. [name] Primary Care Sun Kihn: Darreld Mclean Other Clinician: Referring Rubylee Zamarripa: Treating Elizabeth [name]/Extender: Gerrit Halls in Treatment: 4 Active Problems Location of Pain Severity and Description of Pain Patient Has Paino No Site Locations Elizabeth [name], Elizabeth [name] ([id number]) [id number] Page 6 of 8 Pain Management and Medication Current Pain Management: Electronic Signature(s) Signed: 07/08/2023 11:59:03 AM By: Yevonne Pax RN Entered By: Yevonne Pax on 06/29/2023 12:05:34 -------------------------------------------------------------------------------- Patient/Caregiver Education Details Patient Name: Date of Service: Elizabeth [name], Elizabeth [name] [name] 8/21/2024andnbsp3:00 PM Medical Record Number: [id number] Patient Account Number: [id number] Date of Birth/Gender: Treating RN: May 17, 1951 (72 y.o. [name] Primary Care Physician: Darreld Mclean Other Clinician: Referring Physician: Treating Physician/Extender: Gerrit Halls in Treatment: 4 Education Assessment Education Provided To: Patient Education Topics Provided Wound/Skin Impairment: Handouts: Other: discharge instructions Methods: Explain/Verbal Responses: State content correctly Electronic Signature(s) Signed: 07/08/2023 11:59:03 AM By: Yevonne Pax RN Entered By: Yevonne Pax on 06/29/2023 13:49:43 Elizabeth [name] ([id number]) [id number] Page 7 of 8 -------------------------------------------------------------------------------- Wound Assessment Details Patient Name: Date of Service: Elizabeth [name], Elizabeth [name] [name] 06/29/2023 3:00 PM Medical Record Number: [id number] Patient Account Number: [id number] Date of Birth/Sex:  Treating RN: 05-01-1951 (72 y.o. [name] Primary Care Yarelis Ambrosino: Darreld Mclean Other Clinician: Referring Denise Washburn: Treating Elizabeth [name]/Extender: Gerrit Halls in Treatment: 4 Wound Status Wound Number: 5 Primary Infection - not elsewhere classified Etiology: Wound Location: Right Breast Wound Open Wounding Event: Gradually Appeared Status: Date Acquired: 12/09/2022 Comorbid Hypertension, Peripheral Venous Disease, Type II Diabetes, Weeks Of Treatment: 4 History: Neuropathy, Seizure Disorder, Received Radiation Clustered Wound: No Wound Measurements Length: (cm) Width: (cm) Depth: (cm) Area: (cm) Volume: (cm) 0 % Reduction in Area: 100% 0 % Reduction in Volume: 100% 0 Epithelialization: Large (67-100%) 0 Tunneling: No 0 Undermining: No Wound Description Classification: Full Thickness Without Exposed Support Structures Exudate Amount: None Present Foul Odor After Cleansing: No Slough/Fibrino No Wound Bed Granulation Amount: None Present (0%) Exposed Structure Necrotic Amount: None Present (0%) Fascia Exposed: No Fat Layer (Subcutaneous Tissue) Exposed: No Tendon Exposed: No Muscle Exposed: No Joint Exposed: No Bone Exposed: No Electronic Signature(s) Signed: 07/08/2023 11:59:03 AM By: Yevonne Pax RN Entered By: Yevonne Pax on 06/29/2023 12:08:29 -------------------------------------------------------------------------------- Vitals Details  Patient Name: Date of Service: Elizabeth [name], [place] [name] [name] 06/29/2023 3:00 PM Medical Record Number: [id number] Patient Account Number: [id number] Date of Birth/Sex: Treating RN: [birth year] (72 y.o. [name] Primary Care Elizabeth [name]: Darreld Mclean Other Clinician: Ardine [name] ([id number]) [id number] Page 8 of 8 Referring Elizabeth [name]: Treating Elizabeth [name]/Extender: Gerrit Halls in Treatment: 4 Vital Signs Time Taken: 15:00 Temperature (F): 98.2 Height  (in): 63 Pulse (bpm): 51 Weight (lbs): 208 Respiratory Rate (breaths/min): 18 Body Mass Index (BMI): 36.8 Blood Pressure (mmHg): 98/57 Reference Range: 80 - 120 mg / dl Electronic Signature(s) Signed: 07/08/2023 11:59:03 AM By: Yevonne Pax RN Entered By: Yevonne Pax on 06/29/2023 12:05:28

## 2023-07-08 NOTE — Progress Notes (Signed)
HUBERT, PIETROPAOLO ([id number]) [id number] Page 1 of 8 Visit Report for 06/29/2023 Chief Complaint Document Details Patient Name: Date of Service: Elizabeth [name], Elizabeth Elizabeth LYN L. 06/29/2023 3:00 PM Medical Record Number: [id number] Patient Account Number: [id number] Date of Birth/Sex: Treating RN: 08-May-1951 (72 y.o. [name] Primary Care Provider: Darreld Mclean Other Clinician: Referring Provider: Treating Provider/Extender: Gerrit Halls in Treatment: 4 Information Obtained from: Patient Chief Complaint Bilateral lower extremity phlebolymphedema, ulcerations, and cellulitis. 10/26/16. Patient is here today for reviewable wound on her left plantar first toe 06/01/2023; right breast wound Electronic Signature(s) Signed: 06/29/2023 3:13:38 PM By: Geralyn Corwin DO Entered By: Geralyn Corwin on 06/29/2023 12:09:16 -------------------------------------------------------------------------------- HPI Details Patient Name: Date of Service: Elizabeth [name], Elizabeth Elizabeth LYN L. 06/29/2023 3:00 PM Medical Record Number: [id number] Patient Account Number: [id number] Date of Birth/Sex: Treating RN: 1951-09-12 (72 y.o. [name] Primary Care Provider: Darreld Mclean Other Clinician: Referring Provider: Treating Provider/Extender: Gerrit Halls in Treatment: 4 History of Present Illness HPI Description: Pleasant 72 year old with past medical history significant for diabetes (hemoglobin A1c 6.1 in February 2016), chronic venous stasis disease, and breast cancer. She developed bilateral lower extremity ulcerations and cellulitis and was hospitalized at [facility] in early March 2016. Ultrasound showed no evidence for DVT Treated with IV vancomycin. Discharged on 01/09/2015 on Bactrim and in Unna boots, which she tolerated well. . She is ambulating per her baseline. No significant pain. No claudication or rest pain.  ABI noncompressible. Palpable DP bilaterally. She does not usually wear compression. Cannot apply compression stockings. No fever or chills. No significant drainage. Blood sugars less than 150. 01/28/2015 -- she has not yet received her appointment for a vascular studies both arterial and venous duplex. He is otherwise doing well but has noticed some redness of the right lower extremity in an area where it is no open ulcer. The left lower extremity feels better. 02/04/2015 -- she has had her vascular studies done in [place] and she goes back to see the doctor tomorrow. I have asked her to get business card to them so that they can send [place] a report. she is not very compliant with her now for dressing and if it sits down she does not use a call to come and get it fixed. Her hygiene too seems to be rather poor. 02/11/2015 -- we have got the vascular studies done at [facility] and the lower extremity arterial duplex evaluation done on 01/30/2015 revealed there was [name], [name] ([id number]) [id number] Page 2 of 8 diffuse vessel wall calcification noted throughout the bilateral lower extremity arterial system there is possible arterial inflow disease. No hemodynamically significant stenosis is present. Bilateral calf arteries were not well visualized. the lower extremity venous duplex study done on 01/30/2015 revealed that the patient had no evidence of DVT on both extremities. There was deep venous reflux involving the left lower extremity. No incompetent bilateral great saphenous veins and right small saphenous vein. She was then seen by Dr. Edilia Bo on 02/05/2015. His opinion was that she had adequate arterial flow although there was some evidenc e of calcified disease. However because she had a biphasic Doppler signal in the dorsalis pedis position he did not think it was necessary to do any further arterial workup. He thought it was safe to use mild compression  stockings and elevate her legs. He has written her a prescription for knee-high compression stockings with a gradient of 15-20 mmHg and  is encouraged her to wear these. 02/18/2015 is finally got her prescription filled and has brought her stockings of 15-20 mmHg and come ready to use these. READMISSION 10/26/16; this is a 72 year old woman with type 2 diabetes and according to her a history of peripheral neuropathy. Over the last 6 weeks she has noted a wound on the plantar aspect of her left first toe. There is no obvious precipitating features. She apparently does not have a history of PAD and has had that reviewed previously by vein and vascular in [place]. Her ABI in this clinic however was 1.55. She is been going to her primary physician's office and is been getting DuoDERM which is the primary dressing. She was also on oral antibiotics although I don't think that this was cultured. He tells me she did have a history of wounds previously but is not sure where on her feet. She is wearing ordinary footwear. [facility] she had arterial studies done in March 2016. At that time her ABIs were noncompressible in both legs however Doppler waveforms were multiphasic bilaterally. I do not see that she was actually seen by the physician. She was noted to have diffuse specimen calcification throughout the bilateral lower extremity arterial system. No hemodynamically significant stenosis was present. Bilateral calf arteries were not well visualized which was most likely due to calcifications. The patient also has chronic lower extremity edema. Studies of this in March 2016 showed no evidence of a DVT As deep vein reflux involving the left lower . extremity common femoral, femoral and greater saphenous vein. There was reflux on the right involving the great and small saphenous vein I note that she was seen in this clinic previously I think this was for wounds on her legs. She was  discharged in compression which she tells me she still wears for chronic venous insufficiency and lymphedema. She also has a history of breast cancer, venous reflux and lymphedema 11/02/16; the area in question is on the plantar aspect of left great toe. This looks better this week. Still some dark areas of nonviable tissue required debridement but overall improved. 11/09/16; patient arrives today with wound generally looking better. Has been using silver alginate. 11/23/16; patient arrives today with her wound totally epithelialized. This is on the plantar aspect of her left first toe. He does not have diabetic foot where. Has a Medicare replacement policy through [place] and I've advised her to look at the policy to see if they would cover diabetic shoes with custom inserts. 06/01/2023 Elizabeth [name] is a 72 year old female with a past medical history of right-sided breast cancer with lumpectomy status post radiation in 2015. She states that about 6 months ago she developed a cyst that opened and drained. She has had trouble healing the wound under the right breast. She had a mammogram and ultrasound on 01/19/2023 for this issue and was noted to have benign postsurgical changes subadjacent to the area of concern in the right lower breast. There is no focal drainable fluid. There is no mammographic evidence of mammary malignancy. She has been using Neosporin to the wound bed. 7/31; patient presents for follow-up. She has been using mupirocin ointment and collagen to the wound bed. Wound is much smaller. 8/7; patient presents for follow-up. She has been using antibiotic ointment with collagen to the wound bed. Wound is stable. 8/21; patient presents for follow-up. She has been using antibiotic ointment to the wound bed. She has noted no drainage over the past  week. Her wound has healed. Electronic Signature(s) Signed: 06/29/2023 3:13:38 PM By: Geralyn Corwin DO Entered By: Geralyn Corwin on  06/29/2023 12:09:40 -------------------------------------------------------------------------------- Physical Exam Details Patient Name: Date of Service: Elizabeth [name], Elizabeth Elizabeth LYN L. 06/29/2023 3:00 PM Medical Record Number: [id number] Patient Account Number: [id number] Date of Birth/Sex: Treating RN: 08/20/1951 (72 y.o. [name] Primary Care Provider: Darreld Mclean Other Clinician: Referring Provider: Treating Provider/Extender: Gerrit Halls in Treatment: 4 Constitutional . Psychiatric . Notes ESI, ROSENBAUM ([id number]) [id number] Page 3 of 8 Epithelization to the previous wound site. No drainage, tenderness or induration noted on palpation. Electronic Signature(s) Signed: 06/29/2023 3:13:38 PM By: Geralyn Corwin DO Entered By: Geralyn Corwin on 06/29/2023 12:10:30 -------------------------------------------------------------------------------- Physician Orders Details Patient Name: Date of Service: Elizabeth [name], Elizabeth Elizabeth LYN L. 06/29/2023 3:00 PM Medical Record Number: [id number] Patient Account Number: [id number] Date of Birth/Sex: Treating RN: 1951/10/23 (72 y.o. [name] Primary Care Provider: Darreld Mclean Other Clinician: Referring Provider: Treating Provider/Extender: Gerrit Halls in Treatment: 4 Verbal / Phone Orders: No Diagnosis Coding ICD-10 Coding Code Description S21.001A Unspecified open wound of right breast, initial encounter E11.622 Type 2 diabetes mellitus with other skin ulcer Z85.3 Personal history of malignant neoplasm of breast Z92.3 Personal history of irradiation [facility]charge [facility] Discharge from [facility] Treatment Complete Electronic Signature(s) Signed: 06/29/2023 4:47:21 PM By: Yevonne Pax RN Signed: 06/30/2023 1:36:16 PM By: Geralyn Corwin DO Previous Signature: 06/29/2023 3:13:38 PM Version By: Geralyn Corwin DO Entered By: Yevonne Pax on  06/29/2023 13:47:21 -------------------------------------------------------------------------------- Problem List Details Patient Name: Date of Service: Elizabeth [name], Elizabeth Elizabeth LYN L. 06/29/2023 3:00 PM Medical Record Number: [id number] Patient Account Number: [id number] Date of Birth/Sex: Treating RN: Oct 23, 1951 (72 y.o. [name] Primary Care Provider: Darreld Mclean Other Clinician: Referring Provider: Treating Provider/Extender: Gerrit Halls in Treatment: 4 Active Problems ICD-10 Elizabeth [name], Elizabeth [name] ([id number]) [id number] Page 4 of 8 Encounter Code Description Active Date MDM Diagnosis S21.001A Unspecified open wound of right breast, initial encounter 06/01/2023 No Yes E11.622 Type 2 diabetes mellitus with other skin ulcer 06/01/2023 No Yes Z85.3 Personal history of malignant neoplasm of breast 06/01/2023 No Yes Z92.3 Personal history of irradiation 06/01/2023 No Yes Inactive Problems Resolved Problems Electronic Signature(s) Signed: 06/29/2023 3:13:38 PM By: Geralyn Corwin DO Entered By: Geralyn Corwin on 06/29/2023 12:09:11 -------------------------------------------------------------------------------- Progress Note Details Patient Name: Date of Service: Elizabeth [name], Elizabeth Elizabeth LYN L. 06/29/2023 3:00 PM Medical Record Number: [id number] Patient Account Number: [id number] Date of Birth/Sex: Treating RN: 01/15/1951 (72 y.o. [name] Primary Care Provider: Darreld Mclean Other Clinician: Referring Provider: Treating Provider/Extender: Gerrit Halls in Treatment: 4 Subjective Chief Complaint Information obtained from Patient Bilateral lower extremity phlebolymphedema, ulcerations, and cellulitis. 10/26/16. Patient is here today for reviewable wound on her left plantar first toe 06/01/2023; right breast wound History of Present Illness (HPI) Pleasant 72 year old with past medical history significant for  diabetes (hemoglobin A1c 6.1 in February 2016), chronic venous stasis disease, and breast cancer. She developed bilateral lower extremity ulcerations and cellulitis and was hospitalized at [facility] in early March 2016. Ultrasound showed no evidence for DVT Treated with IV vancomycin. Discharged on 01/09/2015 on Bactrim and in Unna boots, which she tolerated well. . She is ambulating per her baseline. No significant pain. No claudication or rest pain. ABI noncompressible. Palpable DP bilaterally. She does not usually wear compression. Cannot apply compression  stockings. No fever or chills. No significant drainage. Blood sugars less than 150. 01/28/2015 -- she has not yet received her appointment for a vascular studies both arterial and venous duplex. He is otherwise doing well but has noticed some redness of the right lower extremity in an area where it is no open ulcer. The left lower extremity feels better. 02/04/2015 -- she has had her vascular studies done in [place] and she goes back to see the doctor tomorrow. I have asked her to get business card to them so that they can send [place] a report. she is not very compliant with her now for dressing and if it sits down she does not use a call to come and get it fixed. Her hygiene too seems to be rather poor. 02/11/2015 -- we have got the vascular studies done at [facility] and the lower extremity arterial duplex evaluation done on 01/30/2015 revealed there was diffuse vessel wall calcification noted throughout the bilateral lower extremity arterial system there is possible arterial inflow disease. No hemodynamically significant stenosis is present. Bilateral calf arteries were not well visualized. the lower extremity venous duplex study done on 01/30/2015 revealed that the patient had no evidence of DVT on both extremities. There was deep venous reflux involving the left lower extremity. No incompetent bilateral great  saphenous veins and right small saphenous vein. She was then seen by Dr. Edilia Bo on 02/05/2015. His opinion was that she had adequate arterial flow although there was some evidenc e of calcified disease. Elizabeth [name], Elizabeth [name] ([id number]) [id number] Page 5 of 8 However because she had a biphasic Doppler signal in the dorsalis pedis position he did not think it was necessary to do any further arterial workup. He thought it was safe to use mild compression stockings and elevate her legs. He has written her a prescription for knee-high compression stockings with a gradient of 15-20 mmHg and is encouraged her to wear these. 02/18/2015 is finally got her prescription filled and has brought her stockings of 15-20 mmHg and come ready to use these. READMISSION 10/26/16; this is a 72 year old woman with type 2 diabetes and according to her a history of peripheral neuropathy. Over the last 6 weeks she has noted a wound on the plantar aspect of her left first toe. There is no obvious precipitating features. She apparently does not have a history of PAD and has had that reviewed previously by vein and vascular in [place]. Her ABI in this clinic however was 1.55. She is been going to her primary physician's office and is been getting DuoDERM which is the primary dressing. She was also on oral antibiotics although I don't think that this was cultured. He tells me she did have a history of wounds previously but is not sure where on her feet. She is wearing ordinary footwear. [facility] she had arterial studies done in March 2016. At that time her ABIs were noncompressible in both legs however Doppler waveforms were multiphasic bilaterally. I do not see that she was actually seen by the physician. She was noted to have diffuse specimen calcification throughout the bilateral lower extremity arterial system. No hemodynamically significant stenosis was present.  Bilateral calf arteries were not well visualized which was most likely due to calcifications. The patient also has chronic lower extremity edema. Studies of this in March 2016 showed no evidence of a DVT As deep vein reflux involving the left lower . extremity common femoral, femoral and greater saphenous vein. There was reflux  on the right involving the great and small saphenous vein I note that she was seen in this clinic previously I think this was for wounds on her legs. She was discharged in compression which she tells me she still wears for chronic venous insufficiency and lymphedema. She also has a history of breast cancer, venous reflux and lymphedema 11/02/16; the area in question is on the plantar aspect of left great toe. This looks better this week. Still some dark areas of nonviable tissue required debridement but overall improved. 11/09/16; patient arrives today with wound generally looking better. Has been using silver alginate. 11/23/16; patient arrives today with her wound totally epithelialized. This is on the plantar aspect of her left first toe. He does not have diabetic foot where. Has a Medicare replacement policy through [employer] and I've advised her to look at the policy to see if they would cover diabetic shoes with custom inserts. 06/01/2023 Elizabeth [name] is a 72 year old female with a past medical history of right-sided breast cancer with lumpectomy status post radiation in 2015. She states that about 6 months ago she developed a cyst that opened and drained. She has had trouble healing the wound under the right breast. She had a mammogram and ultrasound on 01/19/2023 for this issue and was noted to have benign postsurgical changes subadjacent to the area of concern in the right lower breast. There is no focal drainable fluid. There is no mammographic evidence of mammary malignancy. She has been using Neosporin to the wound bed. 7/31; patient presents for follow-up. She has  been using mupirocin ointment and collagen to the wound bed. Wound is much smaller. 8/7; patient presents for follow-up. She has been using antibiotic ointment with collagen to the wound bed. Wound is stable. 8/21; patient presents for follow-up. She has been using antibiotic ointment to the wound bed. She has noted no drainage over the past week. Her wound has healed. Objective Constitutional Vitals Time Taken: 3:00 PM, Height: 63 in, Weight: 208 lbs, BMI: 36.8, Temperature: 98.2 F, Pulse: 51 bpm, Respiratory Rate: 18 breaths/min, Blood Pressure: 98/57 mmHg. General Notes: Epithelization to the previous wound site. No drainage, tenderness or induration noted on palpation. Integumentary (Hair, Skin) Wound #5 status is Open. Original cause of wound was Gradually Appeared. The date acquired was: 12/09/2022. The wound has been in treatment 4 weeks. The wound is located on the Right Breast. The wound measures 0cm length x 0cm width x 0cm depth; 0cm^2 area and 0cm^3 volume. There is no tunneling or undermining noted. There is a none present amount of drainage noted. There is no granulation within the wound bed. There is no necrotic tissue within the wound bed. Assessment Active Problems ICD-10 Unspecified open wound of right breast, initial encounter Type 2 diabetes mellitus with other skin ulcer Personal history of malignant neoplasm of breast Personal history of irradiation Patient has done well with antibiotic ointment. The previous wound site has epithelialized. She has intact skin throughout the previous incision surgical line. I recommended she call us if she notices any drainage. She may follow-up as needed. Plan Elizabeth [name], Elizabeth [name] ([id number]) [id number] Page 6 of 8 1. Follow-up as needed 2. Discharge from clinic due to closed wound Electronic Signature(s) Signed: 06/29/2023 3:13:38 PM By: Geralyn Corwin DO Entered By: Geralyn Corwin on 06/29/2023  12:11:20 -------------------------------------------------------------------------------- ROS/PFSH Details Patient Name: Date of Service: Elizabeth [name], Elizabeth Elizabeth LYN L. 06/29/2023 3:00 PM Medical Record Number: [id number] Patient Account Number: [id number] Date of Birth/Sex: Treating  RN: 13-Apr-1951 (72 y.o. [name] Primary Care Provider: Darreld Mclean Other Clinician: Referring Provider: Treating Provider/Extender: Gerrit Halls in Treatment: 4 Label Progress Note Print Version as History and Physical for this encounter Information Obtained From Patient Eyes Medical History: Negative for: Cataracts; Glaucoma; Optic Neuritis Past Medical History Notes: left eye blind - detached retina Ear/Nose/Mouth/Throat Medical History: Negative for: Chronic sinus problems/congestion; Middle ear problems Hematologic/Lymphatic Medical History: Negative for: Anemia; Hemophilia; Human Immunodeficiency Virus; Lymphedema; Sickle Cell Disease Respiratory Medical History: Negative for: Aspiration; Asthma; Chronic Obstructive Pulmonary Disease (COPD); Pneumothorax; Sleep Apnea; Tuberculosis Cardiovascular Medical History: Positive for: Hypertension; Peripheral Venous Disease - venous stasis Negative for: Angina; Arrhythmia; Congestive Heart Failure; Coronary Artery Disease; Deep Vein Thrombosis; Hypotension; Myocardial Infarction; Peripheral Arterial Disease; Phlebitis; Vasculitis Gastrointestinal Medical History: Negative for: Cirrhosis ; Colitis; Crohns; Hepatitis A; Hepatitis B; Hepatitis C Endocrine Medical History: Positive for: Type II Diabetes Negative for: Type I Diabetes Time with diabetes: 20 years Elizabeth [name], Elizabeth [name] ([id number]) [id number] Page 7 of 8 Treated with: Oral agents Blood sugar tested every day: Yes Tested : once a day Blood sugar testing results: Breakfast: 150 Genitourinary Medical History: Negative for: End Stage Renal  Disease Immunological Medical History: Negative for: Lupus Erythematosus; Raynauds; Scleroderma Integumentary (Skin) Medical History: Negative for: History of Burn; History of pressure wounds Musculoskeletal Medical History: Negative for: Gout; Rheumatoid Arthritis; Osteoarthritis; Osteomyelitis Neurologic Medical History: Positive for: Neuropathy; Seizure Disorder - years ago Negative for: Dementia; Quadriplegia; Paraplegia Past Medical History Notes: CVA in 2000 Oncologic Medical History: Positive for: Received Radiation - breast cancer - right side Negative for: Received Chemotherapy Psychiatric Medical History: Negative for: Anorexia/bulimia; Confinement Anxiety Immunizations Pneumococcal Vaccine: Received Pneumococcal Vaccination: No Implantable Devices None Hospitalization / Surgery History Type of Hospitalization/Surgery cellulitis and UTI Family and Social History Cancer: Yes - Mother; Diabetes: Yes - Mother,Siblings; Heart Disease: No; Hereditary Spherocytosis: No; Hypertension: No; Kidney Disease: No; Lung Disease: No; Seizures: No; Stroke: No; Thyroid Problems: No; Tuberculosis: No; Never smoker; Marital Status - Married; Alcohol Use: Never; Drug Use: No History; Caffeine Use: Daily; Financial Concerns: No; Food, Clothing or Shelter Needs: No; Support System Lacking: No; Transportation Concerns: No Electronic Signature(s) Signed: 06/29/2023 3:13:38 PM By: Geralyn Corwin DO Signed: 07/08/2023 11:59:03 AM By: Yevonne Pax RN Entered By: Geralyn Corwin on 06/29/2023 12:11:53 Elizabeth [name] ([id number]) [id number] Page 8 of 8 -------------------------------------------------------------------------------- SuperBill Details Patient Name: Date of Service: Elizabeth [name], Elizabeth Elizabeth LYN L. 06/29/2023 Medical Record Number: [id number] Patient Account Number: [id number] Date of Birth/Sex: Treating RN: 1951-06-21 (72 y.o. [name] Primary  Care Provider: Darreld Mclean Other Clinician: Referring Provider: Treating Provider/Extender: Gerrit Halls in Treatment: 4 Diagnosis Coding ICD-10 Codes Code Description S21.001A Unspecified open wound of right breast, initial encounter E11.622 Type 2 diabetes mellitus with other skin ulcer Z85.3 Personal history of malignant neoplasm of breast Z92.3 Personal history of irradiation Facility Procedures : CPT4 Code: [id number] Description: [phone] - WOUND CARE VISIT-LEV 2 EST PT Modifier: Quantity: 1 Physician Procedures : CPT4 Code Description Modifier [id number] - WC PHYS LEVEL 3 - EST PT ICD-10 Diagnosis Description S21.001A Unspecified open wound of right breast, initial encounter E11.622 Type 2 diabetes mellitus with other skin ulcer Z85.3 Personal history of  malignant neoplasm of breast Z92.3 Personal history of irradiation Quantity: 1 Electronic Signature(s) Signed: 06/29/2023 4:48:24 PM By: Yevonne Pax RN Signed: 06/30/2023 1:36:16 PM By: Geralyn Corwin DO Previous Signature: 06/29/2023 3:13:38 PM Version By: Geralyn Corwin  DO Entered By: Yevonne Pax on 06/29/2023 13:48:24

## 2023-07-12 ENCOUNTER — Other Ambulatory Visit: Payer: Self-pay | Admitting: Family Medicine

## 2023-07-12 DIAGNOSIS — Z1231 Encounter for screening mammogram for malignant neoplasm of breast: Secondary | ICD-10-CM

## 2023-07-14 ENCOUNTER — Encounter: Payer: Self-pay | Admitting: *Deleted

## 2023-07-20 ENCOUNTER — Ambulatory Visit
Admission: RE | Admit: 2023-07-20 | Discharge: 2023-07-20 | Disposition: A | Discharge status: Home / Self Care | Payer: Medicare PPO | Source: Ambulatory Visit | Attending: Family Medicine | Admitting: Family Medicine

## 2023-07-20 DIAGNOSIS — Z1231 Encounter for screening mammogram for malignant neoplasm of breast: Secondary | ICD-10-CM | POA: Insufficient documentation

## 2023-07-21 ENCOUNTER — Encounter: Payer: Self-pay | Admitting: Cardiology

## 2023-07-21 ENCOUNTER — Ambulatory Visit: Payer: Medicare PPO | Attending: Cardiology | Admitting: Cardiology

## 2023-07-21 ENCOUNTER — Ambulatory Visit: Payer: Medicare PPO

## 2023-07-21 ENCOUNTER — Telehealth: Payer: Self-pay | Admitting: Cardiology

## 2023-07-21 VITALS — BP 123/74 | HR 43 | Ht 63.0 in | Wt 217.8 lb

## 2023-07-21 DIAGNOSIS — I480 Paroxysmal atrial fibrillation: Secondary | ICD-10-CM | POA: Diagnosis not present

## 2023-07-21 DIAGNOSIS — E785 Hyperlipidemia, unspecified: Secondary | ICD-10-CM

## 2023-07-21 DIAGNOSIS — E1151 Type 2 diabetes mellitus with diabetic peripheral angiopathy without gangrene: Secondary | ICD-10-CM

## 2023-07-21 DIAGNOSIS — I890 Lymphedema, not elsewhere classified: Secondary | ICD-10-CM

## 2023-07-21 DIAGNOSIS — I639 Cerebral infarction, unspecified: Secondary | ICD-10-CM

## 2023-07-21 DIAGNOSIS — R55 Syncope and collapse: Secondary | ICD-10-CM | POA: Diagnosis not present

## 2023-07-21 DIAGNOSIS — I10 Essential (primary) hypertension: Secondary | ICD-10-CM | POA: Diagnosis not present

## 2023-07-21 DIAGNOSIS — I739 Peripheral vascular disease, unspecified: Secondary | ICD-10-CM

## 2023-07-21 DIAGNOSIS — I872 Venous insufficiency (chronic) (peripheral): Secondary | ICD-10-CM

## 2023-07-21 MED ORDER — APIXABAN 5 MG PO TABS: 5.0000 mg | ORAL_TABLET | Freq: Two times a day (BID) | ORAL | 3 refills | Status: DC

## 2023-07-21 MED ORDER — APIXABAN 5 MG PO TABS: 5.0000 mg | ORAL_TABLET | Freq: Two times a day (BID) | ORAL | 0 refills | Status: DC

## 2023-07-21 NOTE — Patient Instructions (Signed)
Medication Instructions:  Your Physician recommend you continue on your current medication as directed.    *If you need a refill on your cardiac medications before your next appointment, please call your pharmacy*   Lab Work: Your provider would like for you to have following labs drawn today MAG and BMET.   If you have labs (blood work) drawn today and your tests are completely normal, you will receive your results only by: MyChart Message (if you have MyChart) OR A paper copy in the mail If you have any lab test that is abnormal or we need to change your treatment, we will call you to review the results.   Testing/Procedures: Heart Monitor:  Your physician has requested you wear a ZIO XT monitor for 14  days.  Your monitor will be mailed to your home address within 3-5 business days. This is sent via [employer] from [employer]. However, if you have not received your monitor after 5 business days please send [place] a MyChart message or call the office at [phone], so we may follow up on this for you.   This monitor is a medical device (single patch monitor) that records the heart's electrical activity. Doctors most often use these monitors to diagnose arrhythmias. Arrhythmias are problems with the speed or rhythm of the heartbeat.   [employer] supplies 1 patch per enrollment. Additional stickers are not available.  Please DO NOT apply the patch if you will be having a Nuclear Stress Test, Echocardiogram, Cardiac CT, Cardiac MRI, Chest X-ray during the period you would be wearing the monitor. The patch cannot be worn during these tests.  You cannot remove and re-apply the ZIO patch monitor.   Applying the Monitor: Once you receive your monitor, this will include a small razor, abrader, and 4 alcohol pads. Shave hair from upper left chest Rub abrader disc in 40 strokes over the left upper chest as indicated in your monitor instructions Clean area with 4 enclosed alcohol pads (there  may be a mild & brief stinging sensation over the newly abraded area, but this is normal). Let dry Apply patch as indicated in monitor instructions. Patch will be placed under collarbone on the left side of the chest with arrow pointing upward. Rub adhesive wings for 2 minutes. Remove white label marked "1". Remove the white label marked "2". Rub patch adhesive wings for an 2 minutes.  While looking in a mirror, press and release button in the center of the patch. You may hear a "click". A small green light will flash 4-6 times and then stop. This will be your indicator that the monitor has been turned on.  Wearing the Monitor: Avoid showering during the first 24 hours of wearing the monitor.  After 24 hours you may shower with the patch on. Take brief showers with your back facing the shower head.  Avoid excessive sweating to help maximize wear time. Do not submerge the device, no hot tubs, and no swimming pools. Keep any lotions or oils away from the patch. Press the button if you feel a symptom. You will hear a small click. Record date, time, and symptoms in the Patient Logbook or App.  Monitor Issues: Call [employer] Customer Care at [phone] if you have questions regarding your Zio Patch Monitor. Call them immediately if you see an orange/ amber colored light blinking on your monitor. If your monitor falls off and you cannot get this reapplied or if you need suggestions for securing your monitor  call [employer] at [phone].   Returning the Monitor: Once you have completed wearing your monitor, follow instructions on the last 2 pages of the Patient Logbook. Stick monitor patch on to the last page of the Patient Logbook.  Place Patient Logbook with monitor in the return box provided. Use locking tab on box and tape box closed securely. The return box has pre-paid postage on it.  Place the return box in the regular [place] Mail box as soon as possible It will take anywhere from  1-2 weeks for your provider to receive and review your results once you mail this back. If for some reason you have misplaced your return box then call our office and we can provide another box and/or mail it off for you.   Billing  and Patient Assistance Program Information: We have supplied [employer] with any of your insurance information on file for billing purposes. [employer] offers a sliding scale Patient Assistance Program for patients that do not have insurance, or whose insurance does not completely cover the cost of the ZIO monitor. You must apply for the Patient Assistance Program to qualify for this discounted rate. To apply, please call [employer] at [phone], select option 1, ask to apply for the Patient Assistance Program. [employer] will ask your household income, and how many people are in your household. They will quote your out-of-pocket cost based on that information. [employer] will also be able to set up for a 83-month, interest-free payment plan if needed.    Follow-Up: At [facility], you and your health needs are our priority.  As part of our continuing mission to provide you with exceptional heart care, we have created designated Provider Care Teams.  These Care Teams include your primary Cardiologist (physician) and Advanced Practice Providers (APPs -  Physician Assistants and Nurse Practitioners) who all work together to provide you with the care you need, when you need it.  We recommend signing up for the patient portal called "MyChart".  Sign up information is provided on this After Visit Summary.  MyChart is used to connect with patients for Virtual Visits (Telemedicine).  Patients are able to view lab/test results, encounter notes, upcoming appointments, etc.  Non-urgent messages can be sent to your provider as well.   To learn more about what you can do with MyChart, go to [email or web address].    Your next appointment:   6 week(s) after monitor  returns  Provider:   You may see Debbe Odea, MD or one of the following Advanced Practice Providers on your designated Care Team:   Nicolasa Ducking, NP Eula Listen, PA-C Cadence Fransico Michael, PA-C Charlsie Quest, NP

## 2023-07-21 NOTE — Progress Notes (Signed)
Cardiology Office Note:  .   Date:  07/21/2023  ID:  Elizabeth [name], DOB August 04, 1951, MRN [id number] PCP: Leanna Sato, MD  [facility] Providers Cardiologist:  Debbe Odea, MD    History of Present Illness: .   Elizabeth [name] is a 72 y.o. 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  She had previously been hospitalized in 04/2021 for a TIA.  Echo during that admission showed an EF of 60-65%, no RWMA, and mild mitral regurgitation with a negative bubble study.  Carotid artery ultrasound showed right greater than left carotid bifurcation atherosclerosis with less than 50% stenosis.  Subsequent outpatient cardiac monitor, abdominal rhythm of sinus with first-degree AV block, second-degree AV block type I, 2 pauses with longest being 3.4 seconds, rare atrial and ventricular ectopy, and no evidence of atrial Fib/ flutter.  She was then admitted to the hospital from 7 29-8 2 for sepsis with E. coli bacteremia secondary to UTI complicated by hydroureteronephrosis and AKI.  High-sensitivity troponins was negative and admission EKG showed sinus rhythm with no acute ST-T wave changes.  She was last seen in clinic 8//23 was doing well from a cardiac perspective.  She continued to deny any symptoms of angina or decompensation.  She did note fatigue following her hospital admission.  No changes to her medication regimen and no further testing was ordered at that time.  She presented to the [facility] emergency department 07/02/2023 after having  a syncopal episode.  The blood pressure was 159/73, pulse 67, respirations 16, temperature 98.0.  Pertinent labs revealed a blood glucose of 1.1, serum creatinine 1.58, calcium 8.7, estimated GFR 35, blood glucose of 127, BNP 670.1, COVID PCR negative, TSH is 0.847.  EKG revealed atrial fibrillation with SVR and prolonged QTc greater than 600.  Patient was given 2 g of magnesium.  Echocardiogram completed revealed LVEF 55 to 60%, no RWMA, mild mitral valve regurgitation.  Carotid duplex revealed minimal to moderate amount of bilateral atherosclerotic plaque right greater than left, similar to 04/2021.  MRI was positive for small cerebellar stroke, less likely the cause of syncopal episode.  Neurology ordered MRI of the head and carotid ultrasound.  She was started on apixaban for CHA2DS2-VASc score of 7.  MRI of the brain revealed near occlusion in the distal right P2, moderate stenosis of the proximal right P2.  Multifocal moderate stenosis in the anterior right M2 and mild stenosis in the left supraclinoid ICA.  EEG was without concern for seizures.  Clopidogrel was discontinued after she was continued on apixaban to decrease the risk of bleeding.  Patient was considered stable for discharge and was subsequently discharged from the facility on 07/04/2023.  She was encouraged to continue with close outpatient follow-up with cardiology and neurology.  She returns to clinic today accompanied by family member.  She continues to suffer from peripheral edema, fatigue, and shortness of breath.  She denies any palpitations, chest pain, chest tightness, lightheadedness/dizziness, or syncopal episodes.  She remains in slow ventricular rhythm that is irregular concerning for atrial fibrillation versus heart block.  Rates  have been in 40s and 50s during hospitalization without AV nodal blocking agents.  She states that she has been compliant with her current medication regimen and denies any bleeding noted with anticoagulant  and no blood noted in her urine or stool.  ROS: 10 point review of systems has been reviewed and considered negative with exception of what is been listed in the HPI  Studies Reviewed: [name]   EKG Interpretation Date/Time:  Thursday July 21 2023 11:41:48 EDT Ventricular Rate:  43 PR Interval:    QRS Duration:  90 QT Interval:  450 QTC Calculation: 380 R Axis:   26  Text Interpretation: Atrial fibrillation with slow ventricular response Low voltage QRS When compared with ECG of 03-Jul-2023 10:52, Confirmed by Charlsie Quest ([id number]) on 07/21/2023 11:47:19 AM   Carotid Duplex 07/03/23 IMPRESSION: Minimal to moderate amount of bilateral atherosclerotic plaque, right-greater-than-left, morphologically similar to the 04/2021 examination and again not resulting in a hemodynamically significant stenosis within either internal carotid artery.  2D echo 07/03/2023: 1. Left ventricular ejection fraction, by estimation, is 55 to 60%. The  left ventricle has normal function. The left ventricle has no regional  wall motion abnormalities. Left ventricular diastolic parameters are  indeterminate.   2. Right ventricular systolic function is normal. The right ventricular  size is mildly enlarged.   3. The mitral valve is normal in structure. Mild mitral valve  regurgitation.   4. The aortic valve is tricuspid. Aortic valve regurgitation is not  visualized.   5. The inferior vena cava is normal in size with <50% respiratory  variability, suggesting right atrial pressure of 8 mmHg.   Comparison(s): The left ventricular function is unchanged.   Zio patch 07/2021: Patient had a min HR of 43 bpm, max HR of 203 bpm, and avg HR of 65 bpm. Predominant underlying rhythm was Sinus Rhythm. First Degree AV Block was present. 2 Pauses occurred, the longest lasting 3.4 secs (17 bpm). Second Degree AV Block-Mobitz I  (Wenckebach) was present. Isolated SVEs were rare (<1.0%), SVE Couplets were rare (<1.0%), and  SVE Triplets were rare (<1.0%). Isolated VEs were rare (<1.0%), VE Couplets were rare (<1.0%), and no VE Triplets were present. No evidence of atrial fibrillation or atrial flutter noted to explain symptoms of CVA. __________   Limited echo with bubble study 04/27/2021: 1. Left ventricular ejection fraction, by estimation, is 60 to 65%. The  left ventricle has normal function. The left ventricle has no regional  wall motion abnormalities. Left ventricular diastolic parameters were  normal.   2. Right ventricular systolic function is normal. The right ventricular  size is normal.   3. The mitral valve is normal in structure. Mild mitral valve  regurgitation.   4. The aortic valve is normal in structure. Aortic valve regurgitation is  not visualized.   IAS/Shunts: No atrial level shunt detected by color flow Doppler. Agitated  saline contrast was given intravenously to evaluate for intracardiac  shunting. There is no evidence of a patent foramen ovale. There is no  evidence of an atrial septal defect.  __________   Carotid artery ultrasound 04/27/2021: IMPRESSION: Right greater than left carotid bifurcation atherosclerosis with less than 50% stenosis. Risk Assessment/Calculations:    CHA2DS2-VASc Score = 7   This indicates a 11.2% annual risk of stroke. The patient's score is based upon: CHF History: 0 HTN History: 1 Diabetes History: 1 Stroke History: 2 Vascular Disease History: 1 Age Score: 1 Gender Score: 1  Physical Exam:   VS:  BP 123/74 (BP Location: Left Arm, Patient Position: Sitting, Cuff Size: Normal)   Pulse (!) 43   Ht 5\' 3"  (1.6 m)   Wt 217 lb 12.8 oz (98.8 kg)   SpO2 97%   BMI 38.58 kg/m    Wt Readings from Last 3 Encounters:  07/21/23 217 lb 12.8 oz (98.8 kg)  07/04/23 204 lb 9.4 oz (92.8 kg)  05/26/23 211 lb (95.7 kg)    GEN: Well nourished, well developed in no acute distress NECK: No JVD; No carotid bruits CARDIAC: IR IR, bradycardiac,  no murmurs, rubs, gallops RESPIRATORY:  Clear to auscultation without rales, wheezing or rhonchi  ABDOMEN: Soft, non-tender, non-distended EXTREMITIES:  2+ edema BLE; No deformity   ASSESSMENT AND PLAN: .   Recent hospital admission for syncope.  Echocardiogram was without significant structural abnormality.  EEG was negative for any seizure activity.  Orthostatic vital signs completed with a ventricular rate Laveda Norman of 53-96 sitting to standing in A-fib so conclusion was difficult to assess.  He concerning syncope may be related to CVA as the MRA revealed significant cerebrovascular disease.  Since discharge she has had no further syncopal episodes but continues to complain of fatigue.  Newly diagnosed atrial fibrillation with slow ventricular response with a history of second-degree AV block type I.  Uncertain chronicity of atrial fibrillation.  EKG reviewed from 2022 did not show any evidence of atrial fibrillation.  She is continued on apixaban 5 mg twice daily for CHA2DS2-VASc score of 7 for stroke prophylaxis.  She does not need reduced dosing criteria at this time.  Echocardiogram completed during hospital revealed a preserved LVSF with normal wall motion.  TSH was normal.  And electrolytes were within normal limits.  She is being sent over for repeat labs today as she did receive mag in the emergency department for low mag and a prolonged QT interval.  With her significant SVR noted today she was ambulatory with monitoring of her heart rate and she was found not to be chronotropic incompetence without elevations in heart rate with activity.  On initial standing and moving she had a decrease in heart rate versus an increase.  She has been placed on a ZIO monitor for 2 weeks and referred to EP to be seen soon for evaluation of possible implantation PPM.  History of CVA with acute cerebellar infarct on MRI of the brain previous admission with a history of cerebrovascular disease.  She is on apixaban and  lieu of clopidogrel to reduce risk of bleeding.  She is maintained on statin therapy with pravastatin 40 mg daily.  Her LDL was 28 on admission.  She will be continued by neurology.  CKD stage III that was stable during hospitalization she has been sent for repeat labs today to reevaluate kidney function and electrolytes.  PVD where she was previously on Plavix that has been discontinued as she is now on apixaban.  She will be continued to be followed by [facility].  Hypertension with a blood pressure today 123/74.  She is continued on furosemide 20 mg daily and lisinopril 20 mg daily.  She has been encouraged to continue to monitor her blood pressures 1 to 2 hours post medication administration at home as well.  Type 2 diabetes where she is continued on Tradjenta.  This continues to be monitored by her PCP.  Longstanding history of lymphedema with 2+ pitting edema to her bilateral lower extremities.  Patient states this is chronic and  is unchanged she has chronic venous insufficiency.  She is continued on furosemide to 20 mg daily with potassium supplementation.  She is having labs done today.       Dispo: Patient to return to clinic in 6 weeks or sooner if needed.  Sooner appointment scheduled with EP.  Signed, Daishaun Ayre, NP

## 2023-07-21 NOTE — Telephone Encounter (Signed)
Left a message to call back and get a follow up appointment

## 2023-07-22 ENCOUNTER — Other Ambulatory Visit: Payer: Self-pay

## 2023-07-22 DIAGNOSIS — Z79899 Other long term (current) drug therapy: Secondary | ICD-10-CM

## 2023-07-22 LAB — BASIC METABOLIC PANEL
BUN/Creatinine Ratio: 13 (ref 12–28)
BUN: 21 mg/dL (ref 8–27)
CO2: 20 mmol/L (ref 20–29)
Calcium: 8.4 mg/dL — ABNORMAL LOW (ref 8.7–10.3)
Chloride: 106 mmol/L (ref 96–106)
Creatinine, Ser: 1.65 mg/dL — ABNORMAL HIGH (ref 0.57–1.00)
Glucose: 92 mg/dL (ref 70–99)
Potassium: 4.2 mmol/L (ref 3.5–5.2)
Sodium: 142 mmol/L (ref 134–144)
eGFR: 33 mL/min/{1.73_m2} — ABNORMAL LOW (ref >59)

## 2023-07-22 LAB — MAGNESIUM: Magnesium: 1.9 mg/dL (ref 1.6–2.3)

## 2023-07-22 MED ORDER — FUROSEMIDE 20 MG PO TABS: ORAL_TABLET | ORAL | 3 refills | Status: DC

## 2023-07-22 NOTE — Progress Notes (Signed)
Slight increase noted in kidney function.  Would decrease furosemide to 5 days a week.  Recommend repeating BMP in 2 weeks.

## 2023-07-26 DIAGNOSIS — I480 Paroxysmal atrial fibrillation: Secondary | ICD-10-CM | POA: Diagnosis not present

## 2023-08-17 ENCOUNTER — Telehealth: Payer: Self-pay | Admitting: Cardiology

## 2023-08-17 NOTE — Telephone Encounter (Signed)
Stat call from [place], reference # [id number] - reports Slow A-Fib, 32 BPM for 60 secs on Oct 1,, 2024 at 9:17 am (page#12; strip # 8)  Sherri consulted with Rosalita Chessman, and Dr Lalla Brothers will see the pt on 08/24/23 at 0820  Referral will be placed  Pt was called and she agrees to to the appt with Dr Lalla Brothers   Pt reminded of appt with Sherri on 11/5  Pt also reported that his RN was the RN that "turned me away" when she arrived at the clinic "last time" because pt didn't have an appointment "you took my paper and marked on it and said I could go", this RN apologized and reported that I had no recollection of those events

## 2023-08-17 NOTE — Telephone Encounter (Signed)
Zio report received via fax placed in [employer]

## 2023-08-17 NOTE — Telephone Encounter (Signed)
Zio by Meredeth Ide calling to give abnormal results.   Ref [id number]

## 2023-08-17 NOTE — Telephone Encounter (Signed)
Report given to triage nurse Danelle Earthly. See previous message.

## 2023-08-18 NOTE — Telephone Encounter (Signed)
This was not in my box?

## 2023-08-23 NOTE — Progress Notes (Unsigned)
Electrophysiology Office Note:    Date:  08/24/2023   ID:  Elizabeth [name], DOB 03-Jun-1951, MRN [id number]  [facility] Cardiologist:  Debbe Odea, MD  [facility] Electrophysiologist:  Lanier Prude, MD   Referring MD: Leanna Sato, MD   Chief Complaint: Atrial fibrillation with slow ventricular rates and syncope  History of Present Illness:    Elizabeth [name] is a 72 y.o. femalewho I am seeing today for an evaluation of atrial fibrillation and syncope at the request of Hedy Camara, NP.  The patient was last seen by Cordelia Pen on July 21, 2023.  The patient has a medical history that includes stroke, TIA, breast cancer, diabetes, peripheral vascular disease, CKD 3B, hypertension, hyperlipidemia, lymphedema, chronic venous insufficiency.  The patient was recently admitted to the hospital with syncope and found to be in new onset atrial fibrillation.  Echocardiogram showed normal ejection fraction.  She was put on Eliquis for stroke prophylaxis.  When hospitalized her ventricular rates are in the 40s and 50s in the absence of nodal blockers.  She tells me her fluid has been accumulating progressively over the last few weeks.  Her legs weep fluid and she has to change her socks in the overnight.  She is fatigued.  She tells me that she stood up from a seated position to go to the bathroom.  While walking to the bathroom she suddenly lost consciousness.  No clear prodromal symptoms.  She has not had another syncopal episode.     Their past medical, social and family history was reveiwed.   ROS:   Please see the history of present illness.    All other systems reviewed and are negative.  EKGs/Labs/Other Studies Reviewed:    The following studies were reviewed today:  August 17, 2023 ZIO monitor personally reviewed 100% A-fib Ventricular rate 28-86, average 50 63 pauses, longest lasting 3.7 seconds Rare ventricular ectopy  July 03, 2023 echo EF  55-60 RV normal Mild MR  July 21, 2023 EKG shows atrial fibrillation with a ventricular rate of 43 bpm Very low voltage      Physical Exam:    VS:  BP 132/68   Pulse 63   Ht 5\' 3"  (1.6 m)   Wt 210 lb 3.2 oz (95.3 kg)   SpO2 98%   BMI 37.24 kg/m     Wt Readings from Last 3 Encounters:  08/24/23 210 lb 3.2 oz (95.3 kg)  07/21/23 217 lb 12.8 oz (98.8 kg)  07/04/23 204 lb 9.4 oz (92.8 kg)     GEN: Chronically ill-appearing, obese in no acute distress CARDIAC: Irregularly irregular, no murmurs, rubs, gallops.  3+ pitting bilateral lower extremity edema to the thighs.  Lower extremities are weeping fluid. RESPIRATORY:  Clear to auscultation without rales, wheezing or rhonchi       ASSESSMENT AND PLAN:    1. PAF (paroxysmal atrial fibrillation) (HCC)   2. Syncope and collapse   3. Primary hypertension     #Persistent atrial fibrillation New within the last year Associated with a very slow ventricular rate and low voltage on her EKG. Continue Eliquis for stroke prophylaxis Long-term, I would like to give her a trial at sinus rhythm with a cardioversion.  Before performing cardioversion I do think she will require permanent pacemaker given her low ventricular rates and history of syncope.  She has a history of conduction system disease.  She is currently not a candidate for permanent pacemaker given the status of her edema  and wounds on her legs.  I am concerned she may have cardiac amyloidosis given the low voltage, fluid retention.  I am going to order the amyloid panel and have her touch base with her heart failure physicians urgently.  Her volume status would have to be much improved before considering implanting permanent pacemaker.  #Symptomatic bradycardia #Syncope The patient has worn a recent heart monitor showing bradycardia.  I do think she would ultimately benefit from a pacemaker but she is currently not a candidate given heart failure status.  Follow-up  with Rosalita Chessman in 3 months.  Follow-up with heart failure team urgently.      Signed, Rossie Muskrat. Lalla Brothers, MD, [facility], [facility] 08/24/2023 8:15 AM    Electrophysiology [facility]

## 2023-08-24 ENCOUNTER — Ambulatory Visit: Payer: Medicare PPO | Attending: Cardiology | Admitting: Cardiology

## 2023-08-24 ENCOUNTER — Encounter: Payer: Self-pay | Admitting: Cardiology

## 2023-08-24 VITALS — BP 132/68 | HR 63 | Ht 63.0 in | Wt 210.2 lb

## 2023-08-24 DIAGNOSIS — R9431 Abnormal electrocardiogram [ECG] [EKG]: Secondary | ICD-10-CM

## 2023-08-24 DIAGNOSIS — I10 Essential (primary) hypertension: Secondary | ICD-10-CM | POA: Diagnosis not present

## 2023-08-24 DIAGNOSIS — I517 Cardiomegaly: Secondary | ICD-10-CM | POA: Diagnosis not present

## 2023-08-24 DIAGNOSIS — I480 Paroxysmal atrial fibrillation: Secondary | ICD-10-CM | POA: Diagnosis not present

## 2023-08-24 DIAGNOSIS — R55 Syncope and collapse: Secondary | ICD-10-CM | POA: Diagnosis not present

## 2023-08-24 MED ORDER — FUROSEMIDE 40 MG PO TABS: 40.0000 mg | ORAL_TABLET | Freq: Every day | ORAL | 3 refills | Status: DC

## 2023-08-24 NOTE — Patient Instructions (Signed)
Medication Instructions:  Your physician has recommended you make the following change in your medication:  1) INCREASE Lasix (furosemide) to 40 mg daily *If you need a refill on your cardiac medications before your next appointment, please call your pharmacy*  Lab Work: TODAY: SPEP and UPEP   Testing/Procedures: Your provider recommends that you have an amyloid scan.  Follow-Up: At [facility], you and your health needs are our priority.  As part of our continuing mission to provide you with exceptional heart care, we have created designated Provider Care Teams.  These Care Teams include your primary Cardiologist (physician) and Advanced Practice Providers (APPs -  Physician Assistants and Nurse Practitioners) who all work together to provide you with the care you need, when you need it.   Your next appointment:   3 months  Provider:   Sherie Don, NP

## 2023-08-25 NOTE — Progress Notes (Signed)
Patient was seen earlier in the day by EP. There is concern that she will need a pacemaker but has been referred to Advanced Heart Failure for continued work-up prior to. Keep previously scheduled appointment with Advanced Heart Failure and EP.

## 2023-08-26 ENCOUNTER — Encounter: Payer: Self-pay | Admitting: Cardiology

## 2023-08-26 ENCOUNTER — Ambulatory Visit: Payer: Medicare PPO | Admitting: Cardiology

## 2023-08-26 ENCOUNTER — Other Ambulatory Visit
Admission: RE | Admit: 2023-08-26 | Discharge: 2023-08-26 | Disposition: A | Discharge status: Home / Self Care | Payer: Medicare PPO | Attending: Cardiology | Admitting: Cardiology

## 2023-08-26 VITALS — BP 136/76 | HR 58 | Resp 14 | Wt 202.0 lb

## 2023-08-26 DIAGNOSIS — I5032 Chronic diastolic (congestive) heart failure: Secondary | ICD-10-CM | POA: Diagnosis not present

## 2023-08-26 DIAGNOSIS — I890 Lymphedema, not elsewhere classified: Secondary | ICD-10-CM | POA: Insufficient documentation

## 2023-08-26 DIAGNOSIS — I130 Hypertensive heart and chronic kidney disease with heart failure and stage 1 through stage 4 chronic kidney disease, or unspecified chronic kidney disease: Secondary | ICD-10-CM | POA: Diagnosis not present

## 2023-08-26 DIAGNOSIS — I872 Venous insufficiency (chronic) (peripheral): Secondary | ICD-10-CM | POA: Diagnosis not present

## 2023-08-26 DIAGNOSIS — E1122 Type 2 diabetes mellitus with diabetic chronic kidney disease: Secondary | ICD-10-CM | POA: Diagnosis not present

## 2023-08-26 DIAGNOSIS — I69344 Monoplegia of lower limb following cerebral infarction affecting left non-dominant side: Secondary | ICD-10-CM | POA: Diagnosis not present

## 2023-08-26 DIAGNOSIS — Z7901 Long term (current) use of anticoagulants: Secondary | ICD-10-CM | POA: Insufficient documentation

## 2023-08-26 DIAGNOSIS — E1142 Type 2 diabetes mellitus with diabetic polyneuropathy: Secondary | ICD-10-CM | POA: Diagnosis not present

## 2023-08-26 DIAGNOSIS — Z79899 Other long term (current) drug therapy: Secondary | ICD-10-CM | POA: Insufficient documentation

## 2023-08-26 DIAGNOSIS — E785 Hyperlipidemia, unspecified: Secondary | ICD-10-CM | POA: Diagnosis not present

## 2023-08-26 DIAGNOSIS — N1832 Chronic kidney disease, stage 3b: Secondary | ICD-10-CM | POA: Insufficient documentation

## 2023-08-26 DIAGNOSIS — I5043 Acute on chronic combined systolic (congestive) and diastolic (congestive) heart failure: Secondary | ICD-10-CM | POA: Diagnosis not present

## 2023-08-26 DIAGNOSIS — D649 Anemia, unspecified: Secondary | ICD-10-CM | POA: Diagnosis not present

## 2023-08-26 DIAGNOSIS — I4891 Unspecified atrial fibrillation: Secondary | ICD-10-CM | POA: Insufficient documentation

## 2023-08-26 LAB — COMPREHENSIVE METABOLIC PANEL
ALT: 13 U/L (ref 0–44)
AST: 16 U/L (ref 15–41)
Albumin: 3.8 g/dL (ref 3.5–5.0)
Alkaline Phosphatase: 113 U/L (ref 38–126)
Anion gap: 11 (ref 5–15)
BUN: 18 mg/dL (ref 8–23)
CO2: 27 mmol/L (ref 22–32)
Calcium: 8.8 mg/dL — ABNORMAL LOW (ref 8.9–10.3)
Chloride: 101 mmol/L (ref 98–111)
Creatinine, Ser: 1.28 mg/dL — ABNORMAL HIGH (ref 0.44–1.00)
GFR, Estimated: 45 mL/min — ABNORMAL LOW (ref >60)
Glucose, Bld: 93 mg/dL (ref 70–99)
Potassium: 3.2 mmol/L — ABNORMAL LOW (ref 3.5–5.1)
Sodium: 139 mmol/L (ref 135–145)
Total Bilirubin: 1.4 mg/dL — ABNORMAL HIGH (ref 0.3–1.2)
Total Protein: 6.9 g/dL (ref 6.5–8.1)

## 2023-08-26 MED ORDER — SPIRONOLACTONE 25 MG PO TABS: 25.0000 mg | ORAL_TABLET | Freq: Every day | ORAL | 11 refills | Status: DC

## 2023-08-26 NOTE — Progress Notes (Signed)
ADVANCED HEART FAILURE NEW PATIENT CLINIC NOTE  Referring Physician: Leanna Sato, MD  Primary Care: Leanna Sato, MD Primary Cardiologist:  HPI: Elizabeth [name] is a 72 y.o. 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     First seen by cardiology in 07/2021 after having been admitted to the hospital in 04/2021 for TIA. Echo during that admission showed an EF of 60 to 65%, no regional wall motion abnormalities, normal LV diastolic function parameters, normal RV systolic function and ventricular cavity size, mild mitral regurgitation, and negative bubble study. Carotid artery ultrasound showed right greater than left carotid bifurcation atherosclerosis with less than 50% stenosis. Subsequent outpatient cardiac monitoring showed a predominant rhythm of sinus with first-degree AV block, second-degree AV block type I, 2 pauses with the longest being 3.4 seconds, rare atrial and ventricular ectopy, and no evidence of A-fib or flutter.   She was recently admitted in 06/2023 for a syncopal episode, was found to be in slow atrial fibrillation. No clear etiology for her stroke was identified. She was seen by EP with future plan for cardioversion and PPM placement, but she was found to be in heart failure so was referred to HF clinic.      SUBJECTIVE: Patient reports that she has slowly been improving over the   PMH, current medications, allergies, social history, and family history reviewed in epic.  PHYSICAL EXAM: There were no vitals filed for this visit. GENERAL: Well nourished and in no apparent  distress at rest.  HEENT: Negative for xanthelasma. There is no scleral icterus.  The mucous membranes are pink and moist.   CHEST: There are no chest wall deformities. There is no chest wall tenderness. Respirations are unlabored.  Lungs- Clear to auscultation bilaterally, normal work of breathing. CARDIAC:  JVP: difficult to visualize          Bradycardic, irregular rhythm, no m/g/r, 3+ lymphedema to the hips ABDOMEN: Soft, non-tender, non-distended. Elizabeth [name]  EXTREMITIES: Warm and well perfused with no cyanosis, clubbing.  NEUROLOGIC: Patient is oriented x3 with no focal or lateralizing neurologic deficits.  PSYCH: Patients affect is appropriate, there is no evidence of anxiety or depression.    DATA REVIEW  ECG: 07/21/2023: Afib, slow ventricular rate  ECHO: 07/03/23: EF 55-60%, atrium mildly dilated to my eye  CATH: None   Heart failure review: - Classification: Heart failure with preserved EF - Etiology: Work up ongoing - NYHA Class: III - Volume status: Hypervolemic - ACEi/ARB/ARNI: Currently up-titrating - Aldosterone antagonist: Plan to start at a subsequent visit - Beta-blocker: Not indicated - Digoxin: Not indicated - Hydralazine/Nitrates: Not indicated - SGLT2i: Intolerant - GLP-1: Not a candidate - Advanced therapies: Not a candidate - ICD: Not a candidate   ASSESSMENT & PLAN:  Chronic diastolic heart failure: Echocardiogram reviewed, e' moderately reduced with biatrial enlargement, given low voltage on EKG reasonable to obtain amyloid workup. Significant weight loss over the last few weeks on daily diuretics, improving renal function. Will continue on current dose, not SGLT-2 candidate with recent UTI. - Start spironolactone 25mg  daily - Continue furosemide 40mg  daily - PYP scan, remainder of amyloid workup  ordered - Follow up after scan - Compression stockings - Continue lisinopril  Atrial fibrillation: Slow rate, likely will need pacemaker in the future.  However, understandably holding off in this decompensated state. - Continue apixaban, patient is compliant  Clearnce Hasten, MD Advanced Heart Failure Mechanical Circulatory Support 08/26/23

## 2023-08-26 NOTE — Patient Instructions (Signed)
Labs done today, your results will be available in MyChart, we will contact you for abnormal readings.  Please follow up in 4 weeks with Dr. Elwyn Lade.   Do the following things EVERYDAY: Weigh yourself in the morning before breakfast. Write it down and keep it in a log. Take your medicines as prescribed Eat low salt foods--Limit salt (sodium) to 2000 mg per day.  Stay as active as you can everyday Limit all fluids for the day to less than 2 liters

## 2023-08-30 LAB — UPEP/UIFE/LIGHT CHAINS/TP, 24-HR UR
% BETA, Urine: 28.8 %
ALBUMIN, U: 30.8 %
ALPHA 1 URINE: 3.9 %
ALPHA-2-GLOBULIN, U: 10.1 %
Free Kappa Lt Chains,Ur: 26.37 mg/L (ref 1.17–86.46)
Free Lambda Lt Chains,Ur: 3.67 mg/L (ref 0.27–15.21)
GAMMA GLOBULIN URINE: 26.4 %
Kappa/Lambda Ratio,U: 7.19 (ref 1.83–14.26)
Protein, 24H Urine: 119 mg/(24.h) (ref 30–150)
Protein, Ur: 8.5 mg/dL

## 2023-08-30 LAB — MULTIPLE MYELOMA PANEL, SERUM
Albumin SerPl Elph-Mcnc: 3.2 g/dL (ref 2.9–4.4)
Albumin/Glob SerPl: 1.3 (ref 0.7–1.7)
Alpha 1: 0.2 g/dL (ref 0.0–0.4)
Alpha2 Glob SerPl Elph-Mcnc: 0.7 g/dL (ref 0.4–1.0)
B-Globulin SerPl Elph-Mcnc: 0.8 g/dL (ref 0.7–1.3)
Gamma Glob SerPl Elph-Mcnc: 1 g/dL (ref 0.4–1.8)
Globulin, Total: 2.6 g/dL (ref 2.2–3.9)
IgA/Immunoglobulin A, Serum: 275 mg/dL (ref 64–422)
IgG (Immunoglobin G), Serum: 918 mg/dL (ref 586–1602)
IgM (Immunoglobulin M), Srm: 97 mg/dL (ref 26–217)
Total Protein: 5.8 g/dL — ABNORMAL LOW (ref 6.0–8.5)

## 2023-08-31 LAB — MULTIPLE MYELOMA PANEL, SERUM
Albumin SerPl Elph-Mcnc: 3.4 g/dL (ref 2.9–4.4)
Albumin/Glob SerPl: 1.3 (ref 0.7–1.7)
Alpha 1: 0.2 g/dL (ref 0.0–0.4)
Alpha2 Glob SerPl Elph-Mcnc: 0.7 g/dL (ref 0.4–1.0)
B-Globulin SerPl Elph-Mcnc: 0.8 g/dL (ref 0.7–1.3)
Gamma Glob SerPl Elph-Mcnc: 1 g/dL (ref 0.4–1.8)
Globulin, Total: 2.8 g/dL (ref 2.2–3.9)
IgA: 275 mg/dL (ref 64–422)
IgG (Immunoglobin G), Serum: 966 mg/dL (ref 586–1602)
IgM (Immunoglobulin M), Srm: 107 mg/dL (ref 26–217)
Total Protein ELP: 6.2 g/dL (ref 6.0–8.5)

## 2023-09-02 ENCOUNTER — Telehealth (HOSPITAL_COMMUNITY): Payer: Self-pay | Admitting: *Deleted

## 2023-09-02 NOTE — Telephone Encounter (Signed)
Reminder call given for upcoming amyloid study on 09/07/23

## 2023-09-07 ENCOUNTER — Ambulatory Visit (HOSPITAL_COMMUNITY): Payer: Medicare PPO | Attending: Cardiology

## 2023-09-07 DIAGNOSIS — R9431 Abnormal electrocardiogram [ECG] [EKG]: Secondary | ICD-10-CM | POA: Diagnosis present

## 2023-09-07 DIAGNOSIS — I517 Cardiomegaly: Secondary | ICD-10-CM | POA: Diagnosis present

## 2023-09-07 MED ORDER — TECHNETIUM TC 99M PYROPHOSPHATE
20.4000 | Freq: Once | INTRAVENOUS | Status: AC
  Administered 2023-09-07: 20.4 via INTRAVENOUS

## 2023-09-08 LAB — MYOCARDIAL AMYLOID PLANAR & SPECT: H/CL Ratio: 1.36

## 2023-09-13 ENCOUNTER — Encounter: Payer: Self-pay | Admitting: Cardiology

## 2023-09-13 ENCOUNTER — Ambulatory Visit: Payer: Medicare PPO | Attending: Cardiology | Admitting: Cardiology

## 2023-09-13 VITALS — BP 96/56 | HR 49 | Ht 63.0 in | Wt 195.0 lb

## 2023-09-13 DIAGNOSIS — I10 Essential (primary) hypertension: Secondary | ICD-10-CM | POA: Diagnosis not present

## 2023-09-13 DIAGNOSIS — E876 Hypokalemia: Secondary | ICD-10-CM

## 2023-09-13 DIAGNOSIS — I739 Peripheral vascular disease, unspecified: Secondary | ICD-10-CM

## 2023-09-13 DIAGNOSIS — I480 Paroxysmal atrial fibrillation: Secondary | ICD-10-CM | POA: Diagnosis not present

## 2023-09-13 DIAGNOSIS — I872 Venous insufficiency (chronic) (peripheral): Secondary | ICD-10-CM

## 2023-09-13 DIAGNOSIS — I5032 Chronic diastolic (congestive) heart failure: Secondary | ICD-10-CM | POA: Diagnosis not present

## 2023-09-13 DIAGNOSIS — I639 Cerebral infarction, unspecified: Secondary | ICD-10-CM

## 2023-09-13 DIAGNOSIS — Z79899 Other long term (current) drug therapy: Secondary | ICD-10-CM

## 2023-09-13 DIAGNOSIS — E785 Hyperlipidemia, unspecified: Secondary | ICD-10-CM

## 2023-09-13 DIAGNOSIS — E1151 Type 2 diabetes mellitus with diabetic peripheral angiopathy without gangrene: Secondary | ICD-10-CM

## 2023-09-13 MED ORDER — POTASSIUM CHLORIDE ER 10 MEQ PO TBCR: 20.0000 meq | EXTENDED_RELEASE_TABLET | Freq: Every day | ORAL | 3 refills | Status: DC

## 2023-09-13 NOTE — Patient Instructions (Signed)
Medication Instructions:  Your physician recommends the following medication changes.  INCREASE: TONIGHT ONLY: Take Potassium 20 meq (2 tablets) at dinner, and 10 meq (1 tablet) at bedtime  Then take Potassium to 20 meq daily thereafter  *If you need a refill on your cardiac medications before your next appointment, please call your pharmacy*   Lab Work: Your provider would like for you to return in 2 week to have the following labs drawn: (BMP).   Please go to [facility] [address] ([facility] [address], [place] [address] You do not need an appointment.  They are open from 7:30 am-4 pm.  Lunch from 1:00 pm- 2:00 pm You will not need to be fasting.   Testing/Procedures: No test ordered today    Follow-Up: At [facility], you and your health needs are our priority.  As part of our continuing mission to provide you with exceptional heart care, we have created designated Provider Care Teams.  These Care Teams include your primary Cardiologist (physician) and Advanced Practice Providers (APPs -  Physician Assistants and Nurse Practitioners) who all work together to provide you with the care you need, when you need it.  We recommend signing up for the patient portal called "MyChart".  Sign up information is provided on this After Visit Summary.  MyChart is used to connect with patients for Virtual Visits (Telemedicine).  Patients are able to view lab/test results, encounter notes, upcoming appointments, etc.  Non-urgent messages can be sent to your provider as well.   To learn more about what you can do with MyChart, go to [email or web address].    Your next appointment:   8-10 week(s)  Provider:   Charlsie Quest, NP

## 2023-09-13 NOTE — Progress Notes (Signed)
Cardiology Office Note:  .   Date:  09/13/2023  ID:  Elizabeth [name], DOB September 30, 1951, MRN [id number] PCP: Leanna Sato, MD  [facility] Providers Cardiologist:  Debbe Odea, MD Electrophysiologist:  Lanier Prude, MD    History of Present Illness: .   Elizabeth [name] is a 72 y.o. 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  Previously was hospitalized 04/2021 for TIA.  Echocardiogram showed EF 60 to 65%, no RWMA, mild MR, negative bubble study.  Carotid artery ultrasound showed a right greater than left carotid bifurcation atherosclerosis with less than 50% stenosis.  Subsequent outpatient cardiac monitoring revealed sinus rhythm with first-degree AV block, second-degree AV block type I, 2 pauses with the longest lasting 3.4 seconds, rare atrial and ventricular ectopy with no evidence of atrial fibrillation/flutter.  She presented to the [facility] emergency department 06/28/2023 after having a syncopal episode.  Blood pressure was slightly elevated but vitals overall were stable.  Serum creatinine was elevated 1.58, BNP of 670.1, COVID PCR was negative,.  EKG revealed atrial fibrillation with SVR and prolonged QTc greater than 600.  Echocardiogram revealed LVEF 55 to 60%, no RWMA, mild mitral valve regurgitation.  Carotid duplex revealed minimal to moderate amount of bilateral atherosclerotic plaque right greater than left similar to 04/2021 study.  MRI was positive for small cerebellar stroke, less likely the cause of her syncopal episode.  Neurology was consulted as well.  She was  started on [facility] with apixaban for CHA2DS2-VASc score of at least 7.  MRI of the brain revealed near occlusion in the right distal P2, moderate stenosis of the proximal right P2.  Multifocal moderate stenosis in the anterior right M2 and mild stenosis in the left supra clinoid ICA.  EEG was without concern for seizures.  Clopidogrel was discontinued after she was continued on apixaban to decrease the risk of bleeding.  She was evaluated by EP Dr. Lalla Brothers on 08/24/2023 for atrial fibrillation with SVR syncope.  October 24 ZIO monitor was reviewed and revealed 100% atrial fibrillation with ventricular rate of 28-86 with an average of 50 bpm.  She had 63 pauses with the longest lasting 3.7 seconds with rare ectopy.  She was continued on apixaban for stroke prophylaxis.  In the long-term we would benefit her to a trial in sinus rhythm with cardioversion.  Before performing cardioversion she would likely require permanent pacemaker placement given her left ventricular rates and history of syncope.  Unfortunately she was not looking for PCP as her edema and wounds on her legs.  She was sent for an amyloid panel and advised to follow-up with heart failure urgently.  She was to follow-up again with EP in 3 months.  She followed up with advanced heart failure 08/26/2023.  Due to being volume she was started on spironolactone 25 mg daily, continued on furosemide 40 mg daily and was ordered a PYP scan for the remainder of amyloid workup.  She was not a candidate for SGLT2 inhibitor due to recurrent UTIs.  No other medication changes were made and she was scheduled for follow-up after her scan was completed  She returns to clinic today  stating that overall she has been doing well.  Her legs continue to be swollen and are weeping.  Weight is down approximately 15 pounds since the last time that she was seen in clinic.  She denies any shortness of breath, chest pain, or palpitations.  She states that she has been compliant  with her current medication regimen.  She denies any bleeding or any noted blood in her stool or urine being on apixaban therapy.  She states that she did follow-up with EP and before they would not place any type of pacemaker they wanted her legs to be healed up.  She then followed with advanced heart failure who doubled up her diuretic medication.  She denies any hospitalizations or visits to the emergency department.  She stated that she did have a scan done at [facility] and has not heard from those results as of yet.  ROS: 10 point review of systems has been reviewed and considered negative with exception was been listed in HPI  Studies Reviewed: [name]   EKG Interpretation Date/Time:  Tuesday September 13 2023 13:55:22 EST Ventricular Rate:  49 PR Interval:    QRS Duration:  82 QT Interval:  460 QTC Calculation: 415 R Axis:   -36  Text Interpretation: Atrial fibrillation with slow ventricular response Left axis deviation Low voltage QRS When compared with ECG of 21-Jul-2023 11:41, Confirmed by Charlsie Quest ([id number]) on 09/13/2023 2:02:35 PM    Myocardial Amyloid 09/07/23   Myocardial uptake was positive for radiotracer uptake. The visual grade of myocardial uptake relative to the ribs was Grade 2 (Myocardial uptake equal to rib uptake).   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   Prior study not available for comparison.  Event Monitor (Zio) 08/18/23 Atrial Fibrillation occurred continuously (100% burden), ranging from 28-86 bpm (avg of 50 bpm). 63 Pauses occurred, the longest lasting 3.7 secs (16 bpm). Isolated VEs were rare (<1.0%), and no VE Couplets or VE Triplets were present. MD notification  criteria for Slow Atrial Fibrillation met - report posted prior to notification per account request ([place]).  Carotid Duplex 07/03/23 IMPRESSION: Minimal to moderate amount of bilateral atherosclerotic plaque, right-greater-than-left, morphologically similar to the  04/2021 examination and again not resulting in a hemodynamically significant stenosis within either internal carotid artery.   2D echo 07/03/2023: 1. Left ventricular ejection fraction, by estimation, is 55 to 60%. The  left ventricle has normal function. The left ventricle has no regional  wall motion abnormalities. Left ventricular diastolic parameters are  indeterminate.   2. Right ventricular systolic function is normal. The right ventricular  size is mildly enlarged.   3. The mitral valve is normal in structure. Mild mitral valve  regurgitation.   4. The aortic valve is tricuspid. Aortic valve regurgitation is not  visualized.   5. The inferior vena cava is normal in size with <50% respiratory  variability, suggesting right atrial pressure of 8 mmHg.   Comparison(s): The left ventricular function is unchanged.    Zio patch 07/2021: Patient had a min HR of 43 bpm, max HR of 203 bpm, and avg HR of 65 bpm. Predominant underlying rhythm was Sinus Rhythm. First Degree AV Block was present. 2 Pauses occurred, the longest lasting 3.4 secs (17 bpm). Second Degree AV Block-Mobitz I  (Wenckebach) was present. Isolated SVEs were rare (<1.0%), SVE Couplets were rare (<1.0%), and SVE Triplets were rare (<1.0%). Isolated VEs were rare (<1.0%), VE Couplets were rare (<1.0%), and no  VE Triplets were present. No evidence of atrial fibrillation or atrial flutter noted to explain symptoms of CVA. __________   Limited echo with bubble study 04/27/2021: 1. Left ventricular ejection fraction, by estimation, is 60 to 65%. The  left ventricle has normal function. The left ventricle has no regional  wall motion abnormalities. Left ventricular diastolic parameters were  normal.   2. Right ventricular systolic function is normal. The right ventricular  size is normal.   3. The mitral valve is normal in structure. Mild mitral valve  regurgitation.   4. The aortic valve is normal in structure. Aortic valve  regurgitation is  not visualized.   IAS/Shunts: No atrial level shunt detected by color flow Doppler. Agitated  saline contrast was given intravenously to evaluate for intracardiac  shunting. There is no evidence of a patent foramen ovale. There is no  evidence of an atrial septal defect.  __________   Carotid artery ultrasound 04/27/2021: IMPRESSION: Right greater than left carotid bifurcation atherosclerosis with less than 50% stenosis. Risk Assessment/Calculations:    CHA2DS2-VASc Score = 7   This indicates a 11.2% annual risk of stroke. The patient's score is based upon: CHF History: 0 HTN History: 1 Diabetes History: 1 Stroke History: 2 Vascular Disease History: 1 Age Score: 1 Gender Score: 1            Physical Exam:   VS:  BP (!) 96/56 (BP Location: Left Arm, Patient Position: Sitting, Cuff Size: Large)   Pulse (!) 49   Ht 5\' 3"  (1.6 m)   Wt 195 lb (88.5 kg)   SpO2 99%   BMI 34.54 kg/m    Wt Readings from Last 3 Encounters:  09/13/23 195 lb (88.5 kg)  08/26/23 202 lb (91.6 kg)  08/24/23 210 lb 3.2 oz (95.3 kg)    GEN: Well nourished, well developed in no acute distress NECK: No JVD; No carotid bruits CARDIAC: IR IR, no murmurs, rubs, gallops RESPIRATORY:  Clear to auscultation without rales, wheezing or rhonchi  ABDOMEN: Soft, non-tender, non-distended EXTREMITIES:  No edema; No deformity   ASSESSMENT AND PLAN: .   Chronic HFpEF with an LVEF of 55 to 60%, no regional wall motion abnormalities and mild MR noted on echocardiogram 06/2023.  With worsening swelling to her bilateral lower extremities she was sent to advanced heart failure clinic and was noted to be volume up.  She is on spironolactone 25 mg daily, furosemide 40 mg daily, she is not on beta-blocker therapy due to bradycardia, and is not a candidate for SGLT2 inhibitors due to recent recurrent UTIs.  She is following up with advanced heart failure for continued amyloid workup as she had myocardial  amyloid scan that showed she was positive for radiotracer uptake grade 2-3.  She has an upcoming follow-up with advanced heart failure to discuss these findings as her granddaughter she stated has several questions related to what she has read about this.  She is about 15 pounds down on her weight.  NYHA class II symptoms.  Encouraged to continue to limit her fluid intake, sodium intake, and to weigh daily.  She is also been encouraged to maintain all appointments with advanced heart failure clinic as previously scheduled.  Atrial fibrillation with slow ventricular response with EKG today continuing to reveal atrial fibrillation.  She remains on apixaban 5 mg twice daily for CHA2DS2-VASc score of at least 7 for stroke prophylaxis.  Denies any bleeding or blood noted in her stool and urine.  Has followed  up with the EP who feels she would benefit from restoration of sinus rhythm but until her legs are healed she is not a candidate for permanent pacemaker placement.  She has upcoming follow-up with EP in January.  History of CVA with acute cerebellar infarct on MRI of the brain with previous admission with a history of cerebrovascular disease.  She is on apixaban and lieu of clopidogrel to reduce the risk of bleeding she is also continued on statin therapy of pravastatin 40 mg daily with last LDL of 28 on admission.  She continues to be followed by neurology.  CKD stage III with improved serum creatinine since increased diureses.  Likely cardiorenal syndrome.  She has been scheduled for follow-up BMP in 2 weeks.  PVD where she was previously on clopidogrel and heparin discontinued and she is now on apixaban.  She continues to follow with [facility].  Hypertension with a blood pressure of 96/56 today.  She is continued on furosemide, lisinopril, and Aldactone.  She is encouraged to continue to monitor pressure 1 to 2 hours postmedication administration at home as well.  Hypokalemia with a serum potassium of 3.2.   Patient is on potassium supplement 10 mEq daily.  With drop in her potassium she will take 40 mEq today and then increase her daily KCl to 20 mEq.  Repeat a BMP in 2 weeks.  Type 2 diabetes where she is continued on Tradjenta that is monitored by her PCP.  Longstanding history of lymphedema with 2+ pitting edema to bilateral lower extremities patient states she has chronic and is unchanged from chronic venous insufficiency.  She is continued on furosemide 40 mg daily and will be following up with wound clinic and will likely require having her legs wrapped.        Dispo: Patient to return to clinic to see MD/APP in 8 to 10 weeks or sooner if needed for reevaluation of symptoms.  Signed, Yareliz Thorstenson, NP

## 2023-09-15 ENCOUNTER — Encounter: Payer: Medicare PPO | Attending: Internal Medicine | Admitting: Physician Assistant

## 2023-09-15 DIAGNOSIS — I872 Venous insufficiency (chronic) (peripheral): Secondary | ICD-10-CM | POA: Insufficient documentation

## 2023-09-15 DIAGNOSIS — G40909 Epilepsy, unspecified, not intractable, without status epilepticus: Secondary | ICD-10-CM | POA: Diagnosis not present

## 2023-09-15 DIAGNOSIS — Z853 Personal history of malignant neoplasm of breast: Secondary | ICD-10-CM | POA: Insufficient documentation

## 2023-09-15 DIAGNOSIS — E1151 Type 2 diabetes mellitus with diabetic peripheral angiopathy without gangrene: Secondary | ICD-10-CM | POA: Diagnosis not present

## 2023-09-15 DIAGNOSIS — Z833 Family history of diabetes mellitus: Secondary | ICD-10-CM | POA: Insufficient documentation

## 2023-09-15 DIAGNOSIS — E1142 Type 2 diabetes mellitus with diabetic polyneuropathy: Secondary | ICD-10-CM | POA: Insufficient documentation

## 2023-09-15 DIAGNOSIS — E11622 Type 2 diabetes mellitus with other skin ulcer: Secondary | ICD-10-CM | POA: Insufficient documentation

## 2023-09-15 DIAGNOSIS — I10 Essential (primary) hypertension: Secondary | ICD-10-CM | POA: Insufficient documentation

## 2023-09-15 DIAGNOSIS — I890 Lymphedema, not elsewhere classified: Secondary | ICD-10-CM | POA: Insufficient documentation

## 2023-09-15 NOTE — Progress Notes (Signed)
KATALYNA, SOCARRAS ([id number]) [id number] Page 1 of 11 Visit Report for 09/15/2023 Allergy List Details Patient Name: Date of Service: Elizabeth [name], [place] [name]. 09/15/2023 8:15 A M Medical Record Number: [id number] Patient Account Number: [id number] Date of Birth/Sex: Treating RN: 05/12/1951 (72 y.o. Elizabeth [name] Primary Care Elizabeth [name]: Elizabeth [name] Other Clinician: Referring Elizabeth [name]: Treating Elizabeth [name]/Extender: Elizabeth [name] in Treatment: 0 Allergies Active Allergies No Known Allergies Allergy Notes Electronic Signature(s) Signed: 09/15/2023 3:57:52 PM By: Elizabeth [name] Entered By: Elizabeth [name] on 09/15/2023 08:24:43 -------------------------------------------------------------------------------- Arrival Information Details Patient Name: Date of Service: Elizabeth [name], [place] [name]. 09/15/2023 8:15 A M Medical Record Number: [id number] Patient Account Number: [id number] Date of Birth/Sex: Treating RN: [birth year] (72 y.o. Elizabeth [name] Primary Care Elizabeth [name]: Elizabeth [name] Other Clinician: Referring Narcissa Melder: Treating Elizabeth [name]/Extender: Elizabeth [name] in Treatment: 0 Visit Information Patient Arrived: Elizabeth [name] Arrival Time: 08:23 Accompanied By: self Transfer Assistance: None Patient Identification Verified: Yes Secondary Verification Process Completed: Yes History Since Last Visit Added or deleted any medications: No Any new allergies or adverse reactions: No Had a fall or experienced change in activities of daily living that may affect risk of falls: No Hospitalized since last visit: No Has Dressing in Place as Prescribed: Yes Has Compression in Place as Prescribed: No Electronic Signature(s) Signed: 09/15/2023 3:57:52 PM By: Elizabeth [name], Elizabeth [name] ([id number]) [id number] Page 2 of 11 Entered By: Elizabeth [name] on 09/15/2023  08:24:02 -------------------------------------------------------------------------------- Clinic Level of Care Assessment Details Patient Name: Date of Service: Elizabeth [name], Elizabeth [name]. 09/15/2023 8:15 A M Medical Record Number: [id number] Patient Account Number: [id number] Date of Birth/Sex: Treating RN: [birth year] (73 y.o. Elizabeth [name] Primary Care Elizabeth [name]: Elizabeth [name] Other Clinician: Referring Elizabeth [name]: Treating Jarika Robben/Extender: Elizabeth [name] in Treatment: 0 Clinic Level of Care Assessment Items TOOL 1 Quantity Score []  - 0 Use when EandM and Procedure is performed on INITIAL visit ASSESSMENTS - Nursing Assessment / Reassessment X- 1 20 General Physical Exam (combine w/ comprehensive assessment (listed just below) when performed on new pt. evals) X- 1 25 Comprehensive Assessment (HX, ROS, Risk Assessments, Wounds Hx, etc.) ASSESSMENTS - Wound and Skin Assessment / Reassessment []  - 0 Dermatologic / Skin Assessment (not related to wound area) ASSESSMENTS - Ostomy and/or Continence Assessment and Care []  - 0 Incontinence Assessment and Management []  - 0 Ostomy Care Assessment and Management (repouching, etc.) PROCESS - Coordination of Care X - Simple Patient / Family Education for ongoing care 1 15 []  - 0 Complex (extensive) Patient / Family Education for ongoing care X- 1 10 Staff obtains Chiropractor, Records, T Results / Process Orders est []  - 0 Staff telephones HHA, Nursing Homes / Clarify orders / etc []  - 0 Routine Transfer to another Facility (non-emergent condition) []  - 0 Routine Hospital Admission (non-emergent condition) X- 1 15 New Admissions / Manufacturing engineer / Ordering NPWT Apligraf, etc. , []  - 0 Emergency Hospital Admission (emergent condition) PROCESS - Special Needs []  - 0 Pediatric / Minor Patient Management []  - 0 Isolation Patient Management []  - 0 Hearing / Language / Visual special needs []  - 0 Assessment  of Community assistance (transportation, D/C planning, etc.) []  - 0 Additional assistance / Altered mentation []  - 0 Support Surface(s) Assessment (bed, cushion, seat, etc.) INTERVENTIONS - Miscellaneous []  - 0 External ear exam []  - 0 Patient Transfer (multiple staff / Nurse, adult / Similar devices) []  - 0 Simple  Staple / Suture removal (25 or less) []  - 0 Complex Staple / Suture removal (26 or more) Elizabeth [name], Elizabeth [name] ([id number]) [id number] Page 3 of 11 []  - 0 Hypo/Hyperglycemic Management (do not check if billed separately) []  - 0 Ankle / Brachial Index (ABI) - do not check if billed separately Has the patient been seen at the hospital within the last three years: Yes Total Score: 85 Level Of Care: New/Established - Level 3 Electronic Signature(s) Signed: 09/15/2023 3:57:52 PM By: Elizabeth [name] Entered By: Elizabeth [name] on 09/15/2023 10:18:09 -------------------------------------------------------------------------------- Encounter Discharge Information Details Patient Name: Date of Service: Elizabeth [name], [place] [name]. 09/15/2023 8:15 A M Medical Record Number: [id number] Patient Account Number: [id number] Date of Birth/Sex: Treating RN: [birth year] (72 y.o. Elizabeth [name] Primary Care Elizabeth [name]: Elizabeth [name] Other Clinician: Referring Elizabeth [name]: Treating Elizabeth [name]/Extender: Elizabeth [name] in Treatment: 0 Encounter Discharge Information Items Post Procedure Vitals Discharge Condition: Stable Temperature (F): 97.5 Ambulatory Status: Cane Pulse (bpm): 71 Discharge Destination: Home Respiratory Rate (breaths/min): 18 Transportation: Private Auto Blood Pressure (mmHg): 110/74 Accompanied By: self 1 son in [place] Schedule Follow-up Appointment: Yes Clinical Summary of Care: Electronic Signature(s) Signed: 09/15/2023 10:22:11 AM By: Elizabeth [name] Entered By: Elizabeth [name] on 09/15/2023  10:22:11 -------------------------------------------------------------------------------- Lower Extremity Assessment Details Patient Name: Date of Service: Elizabeth [name], [place] [name]. 09/15/2023 8:15 A M Medical Record Number: [id number] Patient Account Number: [id number] Date of Birth/Sex: Treating RN: 1951/02/13 (72 y.o. Elizabeth [name] Primary Care Taylyn Brame: Elizabeth [name] Other Clinician: Referring Slade Pierpoint: Treating Fredis Malkiewicz/Extender: Elizabeth [name] in Treatment: 0 Edema Assessment Assessed: [Left: No] [Right: No] Edema: [Left: Yes] [Right: Yes] O[LeftCARALINA, Elizabeth [name] ([id number])] [Right: [id number] Page 4 of 11] Calf Left: Right: Point of Measurement: 32 cm From Medial Instep 42.9 cm 40.4 cm Ankle Left: Right: Point of Measurement: 10 cm From Medial Instep 33.9 cm 32.2 cm Vascular Assessment Pulses: Dorsalis Pedis Palpable: [Left:Yes] [Right:Yes] Posterior Tibial Doppler Audible: [Left:Yes] [Right:Yes] Extremity colors, hair growth, and conditions: Extremity Color: [Left:Red] [Right:Red] Hair Growth on Extremity: [Left:Yes] [Right:Yes] Temperature of Extremity: [Left:Warm < 3 seconds] [Right:Warm < 3 seconds] Toe Nail Assessment Left: Right: Thick: Yes Yes Discolored: Yes Yes Deformed: No No Improper Length and Hygiene: Yes Yes Notes feet and lower legs more pink in color, active weeping Electronic Signature(s) Signed: 09/15/2023 3:57:52 PM By: Elizabeth [name] Entered By: Elizabeth [name] on 09/15/2023 08:44:18 -------------------------------------------------------------------------------- Multi Wound Chart Details Patient Name: Date of Service: Elizabeth [name], [place] [name]. 09/15/2023 8:15 A M Medical Record Number: [id number] Patient Account Number: [id number] Date of Birth/Sex: Treating RN: [birth year] (72 y.o. Elizabeth [name] Primary Care Beckham Capistran: Elizabeth [name] Other Clinician: Referring Jasim Harari: Treating  Fontaine Kossman/Extender: Elizabeth [name] in Treatment: 0 Vital Signs Height(in): 63 Pulse(bpm): 71 Weight(lbs): 197 Blood Pressure(mmHg): 110/74 Body Mass Index(BMI): 34.9 Temperature(F): 97.5 Respiratory Rate(breaths/min): 18 [6:Photos:] [N/A:N/A [id number] Page 5 of 11] Right, Medial Lower Leg Left Lower Leg N/A Wound Location: Blister Blister N/A Wounding Event: Lymphedema Lymphedema N/A Primary Etiology: Lymphedema, Asthma, Arrhythmia, Lymphedema, Asthma, Arrhythmia, N/A Comorbid History: Hypertension, Peripheral Venous Hypertension, Peripheral Venous Disease, Type II Diabetes, History of Disease, Type II Diabetes, History of pressure wounds, Osteomyelitis, pressure wounds, Osteomyelitis, Neuropathy, Seizure Disorder, Neuropathy, Seizure Disorder, Received Radiation Received Radiation 08/29/2023 08/29/2023 N/A Date Acquired: 0 0 N/A Weeks of Treatment: Open Open N/A Wound Status: No No N/A Wound Recurrence: 10x11x0.1 10.5x17x0.1 N/A Measurements [name] x W x D (cm) 86.394 140.194  N/A A (cm) : rea 8.639 14.019 N/A Volume (cm) : Full Thickness Without Exposed Full Thickness Without Exposed N/A Classification: Support Structures Support Structures Large Large N/A Exudate A mount: Serosanguineous Serosanguineous N/A Exudate Type: red, brown red, brown N/A Exudate Color: Large (67-100%) Large (67-100%) N/A Granulation A mount: Red, Pink Red, Pink N/A Granulation Quality: Small (1-33%) Small (1-33%) N/A Necrotic A mount: Fat Layer (Subcutaneous Tissue): Yes Fat Layer (Subcutaneous Tissue): Yes N/A Exposed Structures: None None N/A Epithelialization: Treatment Notes Electronic Signature(s) Signed: 09/15/2023 3:57:52 PM By: Elizabeth [name] Entered By: Elizabeth [name] on 09/15/2023 09:14:51 -------------------------------------------------------------------------------- Multi-Disciplinary Care Plan Details Patient Name: Date  of Service: Elizabeth [name], [place] [name]. 09/15/2023 8:15 A M Medical Record Number: [id number] Patient Account Number: [id number] Date of Birth/Sex: Treating RN: September 26, 1951 (72 y.o. Elizabeth [name] Primary Care Rathana Viveros: Elizabeth [name] Other Clinician: Referring Kavita Bartl: Treating Celester Lech/Extender: Elizabeth [name] in Treatment: 0 Active Inactive Necrotic Tissue Nursing Diagnoses: Impaired tissue integrity related to necrotic/devitalized tissue Elizabeth [name], Elizabeth [name] ([id number]) [phone].pdf Page 6 of 11 Knowledge deficit related to management of necrotic/devitalized tissue Goals: Necrotic/devitalized tissue will be minimized in the wound bed Date Initiated: 09/15/2023 Target Resolution Date: 11/09/2023 Goal Status: Active Patient/caregiver will verbalize understanding of reason and process for debridement of necrotic tissue Date Initiated: 09/15/2023 Date Inactivated: 09/15/2023 Target Resolution Date: 09/14/2023 Goal Status: Met Interventions: Assess patient pain level pre-, during and post procedure and prior to discharge Provide education on necrotic tissue and debridement process Treatment Activities: Apply topical anesthetic as ordered : 09/15/2023 Excisional debridement : 09/15/2023 Notes: Wound/Skin Impairment Nursing Diagnoses: Impaired tissue integrity Knowledge deficit related to ulceration/compromised skin integrity Goals: Ulcer/skin breakdown will have a volume reduction of 30% by week 4 Date Initiated: 09/15/2023 Target Resolution Date: 10/12/2023 Goal Status: Active Ulcer/skin breakdown will have a volume reduction of 50% by week 8 Date Initiated: 09/15/2023 Target Resolution Date: 11/09/2023 Goal Status: Active Ulcer/skin breakdown will have a volume reduction of 80% by week 12 Date Initiated: 09/15/2023 Target Resolution Date: 12/07/2023 Goal Status: Active Ulcer/skin breakdown will heal within 14 weeks Date Initiated:  09/15/2023 Target Resolution Date: 12/21/2023 Goal Status: Active Interventions: Assess patient/caregiver ability to obtain necessary supplies Assess patient/caregiver ability to perform ulcer/skin care regimen upon admission and as needed Assess ulceration(s) every visit Provide education on smoking Provide education on ulcer and skin care Notes: Electronic Signature(s) Signed: 09/15/2023 10:20:56 AM By: Elizabeth [name] Entered By: Elizabeth [name] on 09/15/2023 10:20:56 -------------------------------------------------------------------------------- Pain Assessment Details Patient Name: Date of Service: Elizabeth [name], [place] [name]. 09/15/2023 8:15 A M Medical Record Number: [id number] Patient Account Number: [id number] Date of Birth/Sex: Treating RN: [birth year] (72 y.o. Elizabeth [name] Primary Care Marriah Sanderlin: Elizabeth [name] Other Clinician: Referring Janitza Revuelta: Treating Pantera Winterrowd/Extender: Elizabeth [name] in Treatment: 0 Elizabeth [name], Elizabeth [name] ([id number]) [id number] Page 7 of 11 Active Problems Location of Pain Severity and Description of Pain Patient Has Paino Yes Site Locations Rate the pain. Current Pain Level: 10 Pain Management and Medication Current Pain Management: Notes bilat LE Electronic Signature(s) Signed: 09/15/2023 3:57:52 PM By: Elizabeth [name] Entered By: Elizabeth [name] on 09/15/2023 08:24:13 -------------------------------------------------------------------------------- Patient/Caregiver Education Details Patient Name: Date of Service: Elizabeth [name], [place] [name]. 11/7/2024andnbsp8:15 A M Medical Record Number: [id number] Patient Account Number: [id number] Date of Birth/Gender: Treating RN: 1951-10-04 (72 y.o. Elizabeth [name] Primary Care Physician: Elizabeth [name] Other Clinician: Referring Physician: Treating Physician/Extender: Elizabeth [name] in Treatment: 0 Education Assessment  Education Provided  To: Patient Education Topics Provided Welcome [facility]New [facility] Packet: o Handouts: [facility] P[facility] form, Welcome T [facility] o Methods: Explain/Verbal Responses: State content correctly Wound Debridement: Handouts: Wound Debridement Methods: Explain/Verbal Responses: State content correctly Elizabeth [name], Elizabeth [name] ([id number]) [id number] Page 8 of 11 Wound/Skin Impairment: Handouts: Caring for Your Ulcer Methods: Explain/Verbal Responses: State content correctly Electronic Signature(s) Signed: 09/15/2023 3:57:52 PM By: Elizabeth [name] Entered By: Elizabeth [name] on 09/15/2023 10:21:16 -------------------------------------------------------------------------------- Wound Assessment Details Patient Name: Date of Service: Elizabeth [name], [place] [name]. 09/15/2023 8:15 A M Medical Record Number: [id number] Patient Account Number: [id number] Date of Birth/Sex: Treating RN: 23-May-1951 (72 y.o. Elizabeth [name] Primary Care Mattilynn Forrer: Elizabeth [name] Other Clinician: Referring Konnie Noffsinger: Treating Letonya Mangels/Extender: Elizabeth [name] in Treatment: 0 Wound Status Wound Number: 6 Primary Lymphedema Etiology: Wound Location: Right, Medial Lower Leg Wound Open Wounding Event: Blister Status: Date Acquired: 08/29/2023 Comorbid Lymphedema, Asthma, Arrhythmia, Hypertension, Peripheral Weeks Of Treatment: 0 History: Venous Disease, Type II Diabetes, History of pressure wounds, Clustered Wound: No Osteomyelitis, Neuropathy, Seizure Disorder, Received Radiation Photos Wound Measurements Length: (cm) 10 Width: (cm) 11 Depth: (cm) 0.1 Area: (cm) 86.394 Volume: (cm) 8.639 % Reduction in Area: % Reduction in Volume: Epithelialization: None Tunneling: No Undermining: No Wound Description Classification: Full Thickness Without Exposed Suppor Exudate Amount: Large Exudate Type: Serosanguineous Exudate Color: red, brown t  Structures Foul Odor After Cleansing: No Slough/Fibrino Yes Wound Bed Granulation Amount: Large (67-100%) Exposed Structure Granulation Quality: Red, Pink Fat Layer (Subcutaneous Tissue) Exposed: Yes Necrotic Amount: Small (1-33%) Necrotic Quality: [address] ([id number]) [id number] Page 9 of 11 Treatment Notes Wound #6 (Lower Leg) Wound Laterality: Right, Medial Cleanser Soap and Water Discharge Instruction: Gently cleanse wound with antibacterial soap, rinse and pat dry prior to dressing wounds Wound Cleanser Discharge Instruction: Wash your hands with soap and water. Remove old dressing, discard into plastic bag and place into trash. Cleanse the wound with Wound Cleanser prior to applying a clean dressing using gauze sponges, not tissues or cotton balls. Do not scrub or use excessive force. Pat dry using gauze sponges, not tissue or cotton balls. Peri-Wound Care Topical Primary Dressing Silvercel 4 1/4x 4 1/4 (in/in) Discharge Instruction: Apply Silvercel 4 1/4x 4 1/4 (in/in) as instructed Secondary Dressing Zetuvit Plus 4x8 (in/in) Secured With Compression Wrap Urgo K2 Lite, two layer compression system, large Compression Stockings Add-Ons Electronic Signature(s) Signed: 09/15/2023 3:57:52 PM By: Elizabeth [name] Entered By: Elizabeth [name] on 09/15/2023 08:50:08 -------------------------------------------------------------------------------- Wound Assessment Details Patient Name: Date of Service: Elizabeth [name], [place] [name]. 09/15/2023 8:15 A M Medical Record Number: [id number] Patient Account Number: [id number] Date of Birth/Sex: Treating RN: 1951/04/02 (72 y.o. Elizabeth [name] Primary Care Rumi Kolodziej: Elizabeth [name] Other Clinician: Referring Reford Olliff: Treating Nicko Daher/Extender: Elizabeth [name] in Treatment: 0 Wound Status Wound Number: 7 Primary Lymphedema Etiology: Wound Location: Left Lower Leg Wound  Open Wounding Event: Blister Status: Date Acquired: 08/29/2023 Comorbid Lymphedema, Asthma, Arrhythmia, Hypertension, Peripheral Weeks Of Treatment: 0 History: Venous Disease, Type II Diabetes, History of pressure wounds, Clustered Wound: No Osteomyelitis, Neuropathy, Seizure Disorder, Received Radiation Photos Elizabeth [name], Elizabeth [name] ([id number]) [id number] Page 10 of 11 Wound Measurements Length: (cm) 10.5 Width: (cm) 17 Depth: (cm) 0.1 Area: (cm) 140.194 Volume: (cm) 14.019 % Reduction in Area: % Reduction in Volume: Epithelialization: None Tunneling: No Undermining: No Wound Description Classification: Full Thickness Without Exposed Support Structures Exudate Amount:  Large Exudate Type: Serosanguineous Exudate Color: red, brown Foul Odor After Cleansing: No Slough/Fibrino Yes Wound Bed Granulation Amount: Large (67-100%) Exposed Structure Granulation Quality: Red, Pink Fat Layer (Subcutaneous Tissue) Exposed: Yes Necrotic Amount: Small (1-33%) Necrotic Quality: Adherent Slough Treatment Notes Wound #7 (Lower Leg) Wound Laterality: Left Cleanser Soap and Water Discharge Instruction: Gently cleanse wound with antibacterial soap, rinse and pat dry prior to dressing wounds Wound Cleanser Discharge Instruction: Wash your hands with soap and water. Remove old dressing, discard into plastic bag and place into trash. Cleanse the wound with Wound Cleanser prior to applying a clean dressing using gauze sponges, not tissues or cotton balls. Do not scrub or use excessive force. Pat dry using gauze sponges, not tissue or cotton balls. Peri-Wound Care Topical Primary Dressing Silvercel 4 1/4x 4 1/4 (in/in) Discharge Instruction: Apply Silvercel 4 1/4x 4 1/4 (in/in) as instructed Secondary Dressing Zetuvit Plus 4x8 (in/in) Secured With Compression Wrap Urgo K2 Lite, two layer compression system, large Compression Stockings Add-Ons Electronic  Signature(s) Signed: 09/15/2023 3:57:52 PM By: Elizabeth [name] Entered By: Elizabeth [name] on 09/15/2023 08:51:16 Elizabeth [name] ([id number]) [id number] Page 11 of 11 -------------------------------------------------------------------------------- Vitals Details Patient Name: Date of Service: Elizabeth [name], [place] [name]. 09/15/2023 8:15 A M Medical Record Number: [id number] Patient Account Number: [id number] Date of Birth/Sex: Treating RN: 1951-02-01 (72 y.o. Elizabeth [name] Primary Care Phuong Moffatt: Elizabeth [name] Other Clinician: Referring Braeleigh Pyper: Treating Merrily Tegeler/Extender: Elizabeth [name] in Treatment: 0 Vital Signs Time Taken: 08:20 Temperature (F): 97.5 Height (in): 63 Pulse (bpm): 71 Source: Stated Respiratory Rate (breaths/min): 18 Weight (lbs): 197 Blood Pressure (mmHg): 110/74 Source: Stated Reference Range: 80 - 120 mg / dl Body Mass Index (BMI): 34.9 Electronic Signature(s) Signed: 09/15/2023 3:57:52 PM By: Elizabeth [name] Entered By: Elizabeth [name] on 09/15/2023 08:24:37

## 2023-09-15 NOTE — Progress Notes (Signed)
AUGUSTA, MIRKIN ([id number]) [phone] [id number] Page 1 of 5 Visit Report for 09/15/2023 Abuse Risk Screen Details Patient Name: Date of Service: Elizabeth [name], [place] [name]. 09/15/2023 8:15 A M Medical Record Number: [id number] Patient Account Number: [id number] Date of Birth/Sex: Treating RN: Jun 01, 1951 (72 y.o. Elizabeth [name] Primary Care Lonn Im: Darreld Mclean Other Clinician: Referring Jazsmin Couse: Treating Marcie Shearon/Extender: Quin Hoop in Treatment: 0 Abuse Risk Screen Items Answer ABUSE RISK SCREEN: Has anyone close to you tried to hurt or harm you recentlyo No Do you feel uncomfortable with anyone in your familyo No Has anyone forced you do things that you didnt want to doo No Electronic Signature(s) Signed: 09/15/2023 3:57:52 PM By: Angelina Pih Entered By: Angelina Pih on 09/15/2023 08:31:21 -------------------------------------------------------------------------------- Activities of Daily Living Details Patient Name: Date of Service: Elizabeth [name], [place] [name]. 09/15/2023 8:15 A M Medical Record Number: [id number] Patient Account Number: [id number] Date of Birth/Sex: Treating RN: 1950-11-29 (72 y.o. Elizabeth [name] Primary Care Oni Dietzman: Darreld Mclean Other Clinician: Referring Jabari Swoveland: Treating Abdullahi Vallone/Extender: Quin Hoop in Treatment: 0 Activities of Daily Living Items Answer Activities of Daily Living (Please select one for each item) Drive Automobile Not Able T Medications ake Completely Able Use T elephone Completely Able Care for Appearance Completely Able Use T oilet Completely Able Bath / Shower Completely Able Dress Self Completely Able Feed Self Completely Able Walk Need Assistance Get In / Out Bed Completely Able Housework Need Assistance Elizabeth [name], Elizabeth [name] ([id number]) [phone] [id number] Page 2 of 5 Prepare Meals Need Assistance Handle Money Need  Assistance Shop for Self Need Assistance Electronic Signature(s) Signed: 09/15/2023 3:57:52 PM By: Angelina Pih Entered By: Angelina Pih on 09/15/2023 08:31:59 -------------------------------------------------------------------------------- Education Screening Details Patient Name: Date of Service: Elizabeth [name], [place] [name]. 09/15/2023 8:15 A M Medical Record Number: [id number] Patient Account Number: [id number] Date of Birth/Sex: Treating RN: Oct 09, 1951 (72 y.o. Elizabeth [name] Primary Care Shaliyah Taite: Darreld Mclean Other Clinician: Referring Jailin Moomaw: Treating Rosemond Lyttle/Extender: Quin Hoop in Treatment: 0 Learning Preferences/Education Level/Primary Language Learning Preference: Explanation, Demonstration, Video, Communication Board, Printed Material Preferred Language: English Cognitive Barrier Language Barrier: No Translator Needed: No Memory Deficit: No Emotional Barrier: No Cultural/Religious Beliefs Affecting Medical Care: No Physical Barrier Impaired Vision: Yes Glasses Impaired Hearing: No Decreased Hand dexterity: No Knowledge/Comprehension Knowledge Level: High Comprehension Level: High Ability to understand written instructions: High Ability to understand verbal instructions: High Motivation Anxiety Level: Calm Cooperation: Cooperative Education Importance: Acknowledges Need Interest in Health Problems: Asks Questions Perception: Coherent Willingness to Engage in Self-Management High Activities: Readiness to Engage in Self-Management High Activities: Electronic Signature(s) Signed: 09/15/2023 3:57:52 PM By: Angelina Pih Entered By: Angelina Pih on 09/15/2023 08:32:22 Elizabeth [name] ([id number]) [id number] Page 3 of 5 -------------------------------------------------------------------------------- Fall Risk Assessment Details Patient Name: Date of Service: Elizabeth [name], [place] [name]. 09/15/2023 8:15  A M Medical Record Number: [id number] Patient Account Number: [id number] Date of Birth/Sex: Treating RN: 1951-06-06 (72 y.o. Elizabeth [name] Primary Care Janet Humphreys: Darreld Mclean Other Clinician: Referring Durk Carmen: Treating Dmetrius Ambs/Extender: Quin Hoop in Treatment: 0 Fall Risk Assessment Items Have you had 2 or more falls in the last 12 monthso 0 No Have you had any fall that resulted in injury in the last 12 monthso 0 No FALLS RISK SCREEN History of falling - immediate or within 3 months 0 No Secondary diagnosis (Do you have 2 or more medical diagnoseso)  0 No Ambulatory aid None/bed rest/wheelchair/nurse 0 No Crutches/cane/walker 15 Yes Furniture 0 No Intravenous therapy Access/Saline/Heparin Lock 0 No Gait/Transferring Normal/ bed rest/ wheelchair 0 No Weak (short steps with or without shuffle, stooped but able to lift head while walking, may seek 10 Yes support from furniture) Impaired (short steps with shuffle, may have difficulty arising from chair, head down, impaired 0 No balance) Mental Status Oriented to own ability 0 Yes Electronic Signature(s) Signed: 09/15/2023 3:57:52 PM By: Angelina Pih Entered By: Angelina Pih on 09/15/2023 08:32:47 -------------------------------------------------------------------------------- Foot Assessment Details Patient Name: Date of Service: Elizabeth [name], [place] [name]. 09/15/2023 8:15 A M Medical Record Number: [id number] Patient Account Number: [id number] Date of Birth/Sex: Treating RN: [birth year] (72 y.o. Elizabeth [name] Primary Care Corydon Schweiss: Darreld Mclean Other Clinician: Referring Orlinda Slomski: Treating Demarea Lorey/Extender: Quin Hoop in Treatment: 0 Foot Assessment Items Site Locations Elizabeth [name], Elizabeth [name] ([id number]) [id number] Page 4 of 5 + = Sensation present, - = Sensation absent, C = Callus, U = Ulcer R = Redness, W = Warmth, M = Maceration, PU =  Pre-ulcerative lesion F = Fissure, S = Swelling, D = Dryness Assessment Right: Left: Other Deformity: No No Prior Foot Ulcer: No No Prior Amputation: Yes Yes Charcot Joint: No No Ambulatory Status: Ambulatory With Help Assistance Device: Cane Gait: Steady Electronic Signature(s) Signed: 09/15/2023 3:57:52 PM By: Angelina Pih Entered By: Angelina Pih on 09/15/2023 08:42:48 -------------------------------------------------------------------------------- Nutrition Risk Screening Details Patient Name: Date of Service: Elizabeth [name], [place] [name]. 09/15/2023 8:15 A M Medical Record Number: [id number] Patient Account Number: [id number] Date of Birth/Sex: Treating RN: 1951/07/21 (72 y.o. Elizabeth [name] Primary Care Rawn Quiroa: Darreld Mclean Other Clinician: Referring Davionne Mastrangelo: Treating Florette Thai/Extender: Quin Hoop in Treatment: 0 Height (in): 63 Weight (lbs): 197 Body Mass Index (BMI): 34.9 Nutrition Risk Screening Items Score Screening NUTRITION RISK SCREEN: I have an illness or condition that made me change the kind and/or amount of food I eat 0 No I eat fewer than two meals per day 0 No I eat few fruits and vegetables, or milk products 0 No I have three or more drinks of beer, liquor or wine almost every day 0 No I have tooth or mouth problems that make it hard for me to eat 0 No Elizabeth [name], Elizabeth [name] ([id number]) [id number] Page 5 of 5 I don't always have enough money to buy the food I need 0 No I eat alone most of the time 0 No I take three or more different prescribed or over-the-counter drugs a day 0 No Without wanting to, I have lost or gained 10 pounds in the last six months 0 No I am not always physically able to shop, cook and/or feed myself 0 No Nutrition Protocols Good Risk Protocol 0 No interventions needed Moderate Risk Protocol High Risk Proctocol Risk Level: Good Risk Score: 0 Electronic Signature(s) Signed:  09/15/2023 3:57:52 PM By: Angelina Pih Entered By: Angelina Pih on 09/15/2023 08:32:55

## 2023-09-15 NOTE — Progress Notes (Signed)
LAURYN, LIZARDI ([id number]) [id number] Page 1 of 12 Visit Report for 09/15/2023 Chief Complaint Document Details Patient Name: Date of Service: Elizabeth [name], [place] [name]. 09/15/2023 8:15 A M Medical Record Number: [id number] Patient Account Number: [id number] Date of Birth/Sex: Treating RN: [birth year] (72 y.o. Elizabeth [name] Primary Care Provider: Darreld [name] Other Clinician: Referring Provider: Treating Provider/Extender: Quin Hoop in Treatment: 0 Information Obtained from: Patient Chief Complaint Bilateral LE Ulcers Electronic Signature(s) Signed: 09/15/2023 9:11:39 AM By: Elizabeth Derry PA-C Entered By: Elizabeth [name] on 09/15/2023 09:11:39 -------------------------------------------------------------------------------- Debridement Details Patient Name: Date of Service: Elizabeth [name], [place] [name]. 09/15/2023 8:15 A M Medical Record Number: [id number] Patient Account Number: [id number] Date of Birth/Sex: Treating RN: [birth year] (72 y.o. Elizabeth [name] Primary Care Provider: Darreld [name] Other Clinician: Referring Provider: Treating Provider/Extender: Quin Hoop in Treatment: 0 Debridement Performed for Assessment: Wound #7 Left Lower Leg Performed By: Physician Elizabeth Derry, PA-C The following information was scribed by: Elizabeth [name] The information was scribed for: Elizabeth [name] Debridement Type: Debridement Level of Consciousness (Pre-procedure): Awake and Alert Pre-procedure Verification/Time Out Yes - 09:17 Taken: Pain Control: Lidocaine 4% T opical Solution Percent of Wound Bed Debrided: 75% T Area Debrided (cm): otal 105.09 Tissue and other material debrided: Viable, Non-Viable, Skin: Dermis , Skin: Epidermis Level: Skin/Epidermis Debridement Description: Selective/Open Wound Instrument: Forceps, Scissors Bleeding: Moderate Hemostasis Achieved: Pressure Response to Treatment: Procedure was  tolerated well Level of Consciousness Elizabeth [name], Elizabeth [name] ([id number]) [id number] Page 2 of 12 Level of Consciousness (Post- Awake and Alert procedure): Post Debridement Measurements of Total Wound Length: (cm) 10.5 Width: (cm) 17 Depth: (cm) 0.1 Volume: (cm) 14.019 Character of Wound/Ulcer Post Debridement: Stable Post Procedure Diagnosis Same as Pre-procedure Electronic Signature(s) Signed: 09/15/2023 11:25:01 AM By: Elizabeth Derry PA-C Signed: 09/15/2023 3:57:52 PM By: Elizabeth [name] Entered By: Elizabeth [name] on 09/15/2023 09:19:21 -------------------------------------------------------------------------------- HPI Details Patient Name: Date of Service: Elizabeth [name], [place] [name]. 09/15/2023 8:15 A M Medical Record Number: [id number] Patient Account Number: [id number] Date of Birth/Sex: Treating RN: 02/23/1951 (72 y.o. Elizabeth [name], Elizabeth [name] Primary Care Provider: Darreld [name] Other Clinician: Referring Provider: Treating Provider/Extender: Quin Hoop in Treatment: 0 History of Present Illness HPI Description: Readmission: 09-15-2023 upon evaluation today patient presents for readmission here in the clinic although she was seen just recently although it was for an area on her breast. Subsequently at this point patient's wound actually is on her legs she has bilateral lower extremity lymphedema and she has bilateral lower extremity swelling. She seems to have good arterial flow visually although we could not perform ABIs today due to the location of the wounds. Patient states this started about 2 weeks prior as far as the weeping is concerned she does not wear compression socks as they are too tight to get on she tells me. Patient does have a history of diabetes mellitus type 2, diabetic neuropathy, chronic kidney disease stage IIIb, atrial fibrillation and she is on Eliquis for anticoagulation. Her most recent hemoglobin A1c was  6.5. Electronic Signature(s) Signed: 09/15/2023 9:48:32 AM By: Elizabeth Derry PA-C Entered By: Elizabeth [name] on 09/15/2023 09:48:32 -------------------------------------------------------------------------------- Physical Exam Details Patient Name: Date of Service: Elizabeth [name], [place] [name]. 09/15/2023 8:15 A M Medical Record Number: [id number] Patient Account Number: [id number] Elizabeth [name], Elizabeth [name] ([id number]) [id number] Page 3 of 12 Date of Birth/Sex: Treating RN: Jul 25, 1951 (72 y.o. Elizabeth [name] Primary Care Provider:  Other Clinician: Darreld [name] Referring Provider: Treating Provider/Extender: Quin Hoop in Treatment: 0 Constitutional sitting or standing blood pressure is within target range for patient.. pulse regular and within target range for patient.[name] respirations regular, non-labored and within target range for patient.[name] temperature within target range for patient.. Well-nourished and well-hydrated in no acute distress. Eyes conjunctiva clear no eyelid edema noted. pupils equal round and reactive to light and accommodation. Ears, Nose, Mouth, and Throat no gross abnormality of ear auricles or external auditory canals. normal hearing noted during conversation. mucus membranes moist. Respiratory normal breathing without difficulty. Cardiovascular 1+ dorsalis pedis/posterior tibialis pulses. 3+ pitting edema of the bilateral lower extremities. Musculoskeletal normal gait and posture. no significant deformity or arthritic changes, no loss or range of motion, no clubbing. Psychiatric this patient is able to make decisions and demonstrates good insight into disease process. Alert and Oriented x 3. pleasant and cooperative. Notes Upon evaluation patient's legs actually showed signs of significant swelling. I was able to feel pulses although through the swelling it was difficult. Once I was able to get through the swelling however this  looked much better. With that being said the patient does have an issue here with a tremendous amount of swelling which I think a compression wrap would help with. Her capillary refill was about 4 seconds and appeared to be pretty good foot was warm I think that we are at the point of being able to go ahead and wrap her with light compression wrap we could not get true ABIs today due to the fact that she had blisters and wounds over the area that we would need to put the blood pressure cuff. Will see how things do over the next week if she is not improving significantly I may recommend sending her to vascular for evaluation. Electronic Signature(s) Signed: 09/15/2023 10:13:26 AM By: Elizabeth Derry PA-C Entered By: Elizabeth [name] on 09/15/2023 10:13:26 -------------------------------------------------------------------------------- Physician Orders Details Patient Name: Date of Service: Elizabeth [name], [place] [name]. 09/15/2023 8:15 A M Medical Record Number: [id number] Patient Account Number: [id number] Date of Birth/Sex: Treating RN: 18-May-1951 (72 y.o. 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 Nurse Visit as needed - PRN - to re-wrap likely Monday 09/19/23 Bathing/ Shower/ Hygiene May shower with wound dressing protected with water repellent cover or cast protector. No tub bath. Anesthetic (Use 'Patient Medications' Section for Anesthetic Order Entry) Lidocaine applied to wound bed Edema Control - Orders / Instructions Elevate, Exercise Daily and A void Standing for Long Periods of Time. Elevate leg(s) parallel to the floor when sitting. DO YOUR BEST to sleep in the bed at night. DO NOT sleep in your recliner. Long hours of sitting in a recliner leads to swelling of the  legs and/or potential wounds on your backside. Wound Treatment Wound #6 - Lower Leg Wound Laterality: Right, Medial Cleanser: Soap and Water 2 x Per Week/15 Days Discharge Instructions: Gently cleanse wound with antibacterial soap, rinse and pat dry prior to dressing wounds Cleanser: Wound Cleanser 2 x Per Week/15 Days Discharge Instructions: Wash your hands with soap and water. Remove old dressing, discard into plastic bag and place into trash. Cleanse the wound with Wound Cleanser prior to applying a clean dressing using gauze sponges, not tissues or cotton balls. Do not scrub or use excessive force. Pat dry using gauze sponges, not tissue or cotton balls. Prim Dressing: Silvercel 4 1/4x 4 1/4 (in/in) 2 x Per Week/15 Days ary Discharge Instructions: Apply Silvercel 4 1/4x 4 1/4 (in/in) as instructed Secondary Dressing: Zetuvit Plus 4x8 (in/in) 2 x Per Week/15 Days Compression Wrap: Urgo K2 Lite, two layer compression system, large 2 x Per Week/15 Days Wound #7 - Lower Leg Wound Laterality: Left Cleanser: Soap and Water 2 x Per Week/15 Days Discharge Instructions: Gently cleanse wound with antibacterial soap, rinse and pat dry prior to dressing wounds Cleanser: Wound Cleanser 2 x Per Week/15 Days Discharge  Instructions: Wash your hands with soap and water. Remove old dressing, discard into plastic bag and place into trash. Cleanse the wound with Wound Cleanser prior to applying a clean dressing using gauze sponges, not tissues or cotton balls. Do not scrub or use excessive force. Pat dry using gauze sponges, not tissue or cotton balls. Prim Dressing: Silvercel 4 1/4x 4 1/4 (in/in) 2 x Per Week/15 Days ary Discharge Instructions: Apply Silvercel 4 1/4x 4 1/4 (in/in) as instructed Secondary Dressing: Zetuvit Plus 4x8 (in/in) 2 x Per Week/15 Days Compression Wrap: Urgo K2 Lite, two layer compression system, large 2 x Per Week/15 Days Electronic Signature(s) Signed: 09/15/2023 11:25:01 AM By: Elizabeth Derry PA-C Signed: 09/15/2023 3:57:52 PM By: Elizabeth [name] Entered By: Elizabeth [name] on 09/15/2023 09:21:22 Problem List Details -------------------------------------------------------------------------------- Elizabeth [name] ([id number]) [id number] Page 5 of 12 Patient Name: Date of Service: Elizabeth [name], [place] [name]. 09/15/2023 8:15 A M Medical Record Number: [id number] Patient Account Number: [id number] Date of Birth/Sex: Treating RN: 11/16/1950 (72 y.o. 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 09/15/2023 8:15 A M Medical Record Number: [id number] Patient Account Number: [id number] Date of Birth/Sex: Treating RN: May 23, 1951 (72 y.o. Elizabeth [name] Primary Care Provider: Darreld [name] Other Clinician: Referring Provider: Treating Provider/Extender: Quin Hoop in Treatment: 0 Elizabeth [name], Elizabeth [name] ([id number]) [id number] Page 6 of 12 Subjective Chief Complaint Information obtained from  Patient Bilateral LE Ulcers History of Present Illness (HPI) Readmission: 09-15-2023 upon evaluation today patient presents for readmission here in the clinic although she was seen just recently although it was for an area on her breast. Subsequently at this point patient's wound actually is on her legs she has bilateral lower extremity lymphedema and she has bilateral lower extremity swelling. She seems to have good arterial flow visually although we could not perform ABIs today due to the location of the wounds. Patient states this started about 2 weeks prior as far as the weeping is concerned she does not wear compression socks as they are too tight to get on she tells me. Patient does have a history of diabetes mellitus type 2, diabetic neuropathy, chronic kidney disease stage IIIb, atrial fibrillation and she is on Eliquis for anticoagulation. Her most recent hemoglobin A1c was 6.5. Patient History Information obtained from Patient, Chart. Allergies No Known Allergies Family  History Cancer - Mother, Diabetes - Mother,Siblings, No family history of Heart Disease, Hereditary Spherocytosis, Hypertension, Kidney Disease, Lung Disease, Seizures, Stroke, Thyroid Problems, Tuberculosis. Social History Never smoker, Marital Status - Married, Alcohol Use - Never, Drug Use - No History, Caffeine Use - Daily. Medical History Eyes Denies history of Cataracts, Glaucoma, Optic Neuritis Ear/Nose/Mouth/Throat Denies history of Chronic sinus problems/congestion, Middle ear problems Hematologic/Lymphatic Patient has history of Lymphedema Denies history of Anemia, Hemophilia, Human Immunodeficiency Virus, Sickle Cell Disease Respiratory Patient has history of Asthma Denies history of Aspiration, Chronic Obstructive Pulmonary Disease (COPD), Pneumothorax, Sleep Apnea, Tuberculosis Cardiovascular Patient has history of Arrhythmia - a fib, Hypertension, Peripheral Venous Disease - venous stasis Denies history of Angina, Congestive Heart Failure, Coronary Artery Disease, Deep Vein Thrombosis, Hypotension, Myocardial Infarction, Peripheral Arterial Disease, Phlebitis, Vasculitis Gastrointestinal Denies history of Cirrhosis , Colitis, Crohns, Hepatitis A, Hepatitis B, Hepatitis C Endocrine Patient has history of Type II Diabetes Denies history of Type I Diabetes Genitourinary Denies history of End Stage Renal Disease Immunological Denies history of Lupus Erythematosus, Raynauds, Scleroderma Integumentary (Skin) Patient has history of History of pressure wounds Denies history of History of Burn Musculoskeletal Patient has history of Osteomyelitis - right foot Denies history of Gout, Rheumatoid Arthritis, Osteoarthritis Neurologic Patient has history of Neuropathy, Seizure Disorder - years ago Denies history of Dementia, Quadriplegia, Paraplegia Oncologic Patient has history of Received Radiation - breast cancer - right side Denies history of Received  Chemotherapy Psychiatric Denies history of Anorexia/bulimia, Confinement Anxiety Patient is treated with Oral Agents. Blood sugar is tested. Blood sugar results noted at the following times: Breakfast - 148. Hospitalization/Surgery History - cellulitis and UTI. 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 Eyes Complains or has symptoms of Glasses / Contacts. Elizabeth [name], Elizabeth [name] ([id number]) [id number] Page 7 of 12 Ear/Nose/Mouth/Throat Denies complaints or symptoms of Difficult clearing ears, Sinusitis. Respiratory Complains or has symptoms of Shortness of Breath. Gastrointestinal Denies complaints or symptoms of Frequent diarrhea, Nausea, Vomiting. Genitourinary chronic kidney disease 3b Immunological Denies complaints or symptoms of Hives, Itching. Integumentary (Skin) Complains or has symptoms of Wounds, Breakdown, Swelling, cellulitis Neurologic stroke AUG 2024 TIA Oncologic breast right Psychiatric depression Objective Constitutional sitting or standing blood pressure is within target range for patient.. pulse regular and within target range for patient.[name] respirations regular, non-labored and within target range for patient.[name] temperature within target range  for patient.. Well-nourished and well-hydrated in no acute distress. Vitals Time Taken: 8:20 AM, Height: 63 in, Source: Stated, Weight: 197 lbs, Source: Stated, BMI: 34.9, Temperature: 97.5 F, Pulse: 71 bpm, Respiratory Rate: 18 breaths/min, Blood Pressure: 110/74 mmHg. Eyes conjunctiva clear no eyelid edema noted. pupils equal round and reactive to light and accommodation. Ears, Nose, Mouth, and Throat no gross abnormality of ear auricles or external  auditory canals. normal hearing noted during conversation. mucus membranes moist. Respiratory normal breathing without difficulty. Cardiovascular 1+ dorsalis pedis/posterior tibialis pulses. 3+ pitting edema of the bilateral lower extremities. Musculoskeletal normal gait and posture. no significant deformity or arthritic changes, no loss or range of motion, no clubbing. Psychiatric this patient is able to make decisions and demonstrates good insight into disease process. Alert and Oriented x 3. pleasant and cooperative. General Notes: Upon evaluation patient's legs actually showed signs of significant swelling. I was able to feel pulses although through the swelling it was difficult. Once I was able to get through the swelling however this looked much better. With that being said the patient does have an issue here with a tremendous amount of swelling which I think a compression wrap would help with. Her capillary refill was about 4 seconds and appeared to be pretty good foot was warm I think that we are at the point of being able to go ahead and wrap her with light compression wrap we could not get true ABIs today due to the fact that she had blisters and wounds over the area that we would need to put the blood pressure cuff. Will see how things do over the next week if she is not improving significantly I may recommend sending her to vascular for evaluation. Integumentary (Hair, Skin) Wound #6 status is Open. Original cause of wound was Blister. The date acquired was: 08/29/2023. The wound is located on the Right,Medial Lower Leg. The wound measures 10cm length x 11cm width x 0.1cm depth; 86.394cm^2 area and 8.639cm^3 volume. There is Fat Layer (Subcutaneous Tissue) exposed. There is no tunneling or undermining noted. There is a large amount of serosanguineous drainage noted. There is large (67-100%) red, pink granulation within the wound bed. There is a small (1-33%) amount of necrotic tissue  within the wound bed including Adherent Slough. Wound #7 status is Open. Original cause of wound was Blister. The date acquired was: 08/29/2023. The wound is located on the Left Lower Leg. The wound measures 10.5cm length x 17cm width x 0.1cm depth; 140.194cm^2 area and 14.019cm^3 volume. There is Fat Layer (Subcutaneous Tissue) exposed. There is no tunneling or undermining noted. There is a large amount of serosanguineous drainage noted. There is large (67-100%) red, pink granulation within the wound bed. There is a small (1-33%) amount of necrotic tissue within the wound bed including Adherent Slough. 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 There was a Selective/Open Wound Skin/Epidermis Debridement with a total area of 105.09 sq cm performed by Elizabeth Derry, PA-C. With the following instrument(s): Forceps,  and Scissors to remove Viable and Non-Viable tissue/material. Material removed includes Skin: Dermis and Skin: Epidermis and after achieving pain control using Lidocaine 4% Topical Solution. No specimens were taken. A time out was conducted at 09:17, prior to the start of the procedure. A Moderate amount of bleeding was controlled with Pressure. The procedure was tolerated well. Post Debridement Measurements: 10.5cm length x 17cm width x 0.1cm depth; 14.019cm^3 volume. Character of Wound/Ulcer Post  Debridement is stable. Post procedure Diagnosis Wound #7: Same as Pre-Procedure Plan Follow-up Appointments: Return Appointment in 1 week. Nurse Visit as needed - PRN - to re-wrap likely Monday 09/19/23 Bathing/ Shower/ Hygiene: May shower with wound dressing protected with water repellent cover or cast protector. No tub bath. Anesthetic (Use 'Patient Medications' Section for Anesthetic Order Entry): Lidocaine applied to wound bed Edema Control - Orders / Instructions: Elevate, Exercise Daily and Avoid Standing for Long Periods of Time. Elevate leg(s) parallel to the floor when sitting. DO YOUR BEST to sleep in the bed at night. DO NOT sleep in your recliner. Long hours of sitting in a recliner leads to swelling of the legs and/or potential wounds on your backside. WOUND #6: - Lower Leg Wound Laterality: Right, Medial Cleanser: Soap and Water 2 x Per Week/15 Days Discharge Instructions: Gently cleanse wound with antibacterial soap, rinse and pat dry prior to dressing wounds Cleanser: Wound Cleanser 2 x Per Week/15 Days Discharge Instructions: Wash your hands with soap and water. Remove old dressing, discard into plastic bag and place into trash. Cleanse the wound with Wound Cleanser prior to applying a clean dressing using gauze sponges, not tissues or cotton balls. Do not scrub or use excessive force. Pat dry using gauze sponges, not tissue or cotton balls. Prim Dressing: Silvercel 4 1/4x 4 1/4 (in/in) 2 x Per Week/15 Days ary Discharge Instructions: Apply Silvercel 4 1/4x 4 1/4 (in/in) as instructed Secondary Dressing: Zetuvit Plus 4x8 (in/in) 2 x Per Week/15 Days Com pression Wrap: Urgo K2 Lite, two layer compression system, large 2 x Per Week/15 Days WOUND #7: - Lower Leg Wound Laterality: Left Cleanser: Soap and Water 2 x Per Week/15 Days Discharge Instructions: Gently cleanse wound with antibacterial soap, rinse and pat dry prior to dressing wounds Cleanser: Wound Cleanser 2 x  Per Week/15 Days Discharge Instructions: Wash your hands with soap and water. Remove old dressing, discard into plastic bag and place into trash. Cleanse the wound with Wound Cleanser prior to applying a clean dressing using gauze sponges, not tissues or cotton balls. Do not scrub or use excessive force. Pat dry using gauze sponges, not tissue or cotton balls. Prim Dressing: Silvercel 4 1/4x 4 1/4 (in/in) 2 x Per Week/15 Days ary Discharge Instructions: Apply Silvercel 4 1/4x 4 1/4 (in/in) as instructed Secondary Dressing: Zetuvit Plus 4x8 (in/in) 2 x Per Week/15 Days Com pression Wrap: Urgo K2 Lite, two layer compression system, large 2 x Per Week/15 Days 1. I would recommend currently based on what we are seeing that we go ahead and initiate treatment with a compression wrapping we will do the Urgo lite K2 compression wrap which I think should be sufficient for her to not be too tight. 2. I would recommend silver alginate and Zetuvit to the wounds. 3. I did recommend the patient should be elevating her legs much as possible to help with edema control. We will see patient back for reevaluation in 1 week here in the clinic. If anything worsens or changes patient will contact our office  for additional recommendations. Electronic Signature(s) Signed: 09/15/2023 10:13:57 AM By: Elizabeth Derry PA-C Entered By: Elizabeth [name] on 09/15/2023 10:13:56 Elizabeth [name] ([id number]) [id number] Page 9 of 12 -------------------------------------------------------------------------------- ROS/PFSH Details Patient Name: Date of Service: Elizabeth [name], [place] [name]. 09/15/2023 8:15 A M Medical Record Number: [id number] Patient Account Number: [id number] Date of Birth/Sex: Treating RN: January 17, 1951 (72 y.o. Elizabeth [name] Primary Care Provider: Darreld [name] Other Clinician: Referring Provider: Treating Provider/Extender: Quin Hoop in Treatment: 0 Label Progress  Note Print Version as History and Physical for this encounter Information Obtained From Patient Chart Constitutional Symptoms (General Health) Complaints and Symptoms: Negative for: Fatigue; Fever; Chills; Marked Weight Change Eyes Complaints and Symptoms: Positive for: Glasses / Contacts Medical History: Negative for: Cataracts; Glaucoma; Optic Neuritis Past Medical History Notes: left eye blind - detached retina Ear/Nose/Mouth/Throat Complaints and Symptoms: Negative for: Difficult clearing ears; Sinusitis Medical History: Negative for: Chronic sinus problems/congestion; Middle ear problems Respiratory Complaints and Symptoms: Positive for: Shortness of Breath Medical History: Positive for: Asthma Negative for: Aspiration; Chronic Obstructive Pulmonary Disease (COPD); Pneumothorax; Sleep Apnea; Tuberculosis Gastrointestinal Complaints and Symptoms: Negative for: Frequent diarrhea; Nausea; Vomiting Medical History: Negative for: Cirrhosis ; Colitis; Crohns; Hepatitis A; Hepatitis B; Hepatitis C Immunological Complaints and Symptoms: Negative for: Hives; Itching Medical History: Negative for: Lupus Erythematosus; Raynauds; Scleroderma Integumentary (Skin) Complaints and Symptoms: Positive for: Wounds; Breakdown; Swelling Review of System Notes: cellulitis Medical History: Positive for: History of pressure wounds Laurent, Jolee [name] ([id number]) [id number] Page 10 of 12 Negative for: History of Burn Hematologic/Lymphatic Medical History: Positive for: Lymphedema Negative for: Anemia; Hemophilia; Human Immunodeficiency Virus; Sickle Cell Disease Cardiovascular Medical History: Positive for: Arrhythmia - a fib; Hypertension; Peripheral Venous Disease - venous stasis Negative for: Angina; Congestive Heart Failure; Coronary Artery Disease; Deep Vein Thrombosis; Hypotension; Myocardial Infarction; Peripheral Arterial Disease; Phlebitis;  Vasculitis Endocrine Medical History: Positive for: Type II Diabetes Negative for: Type I Diabetes Time with diabetes: 20 years Treated with: Oral agents Blood sugar tested every day: Yes Tested : once a day Blood sugar testing results: Breakfast: 148 Genitourinary Complaints and Symptoms: Review of System Notes: chronic kidney disease 3b Medical History: Negative for: End Stage Renal Disease Musculoskeletal Medical History: Positive for: Osteomyelitis - right foot Negative for: Gout; Rheumatoid Arthritis; Osteoarthritis Past Medical History Notes: previous amputations Neurologic Complaints and Symptoms: Review of System Notes: stroke AUG 2024 TIA Medical History: Positive for: Neuropathy; Seizure Disorder - years ago Negative for: Dementia; Quadriplegia; Paraplegia Past Medical History Notes: CVA in 2000 Oncologic Complaints and Symptoms: Review of System Notes: breast right Medical History: Positive for: Received Radiation - breast cancer - right side Negative for: Received Chemotherapy Psychiatric Complaints and Symptoms: Review of System Notes: depression Medical History: Negative for: Anorexia/bulimia; Confinement Anxiety Immunizations Pneumococcal Vaccine: Received Pneumococcal Vaccination: No Implantable Devices None Lemarr, Shari [name] ([id number]) [id number] Page 11 of 12 Hospitalization / Surgery History Type of Hospitalization/Surgery cellulitis and UTI Family and Social History Cancer: Yes - Mother; Diabetes: Yes - Mother,Siblings; Heart Disease: No; Hereditary Spherocytosis: No; Hypertension: No; Kidney Disease: No; Lung Disease: No; Seizures: No; Stroke: No; Thyroid Problems: No; Tuberculosis: No; Never smoker; Marital Status - Married; Alcohol Use: Never; Drug Use: No History; Caffeine Use: Daily; Financial Concerns: No; Food, Clothing or Shelter Needs: No; Support System Lacking: No; Transportation Concerns:  No Electronic Signature(s) Signed: 09/15/2023 11:25:01 AM By: Elizabeth Derry PA-C Signed: 09/15/2023 3:57:52 PM By: Elizabeth [name] Entered By: Elizabeth [name] on 09/15/2023 08:31:12 --------------------------------------------------------------------------------  SuperBill Details Patient Name: Date of Service: Elizabeth [name], [place] [name]. 09/15/2023 Medical Record Number: [id number] Patient Account Number: [id number] Date of Birth/Sex: Treating RN: 1951/02/26 (72 y.o. 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 N18.32 Chronic kidney disease, stage 3b I48.0 Paroxysmal atrial fibrillation Z79.01 Long term (current) use of anticoagulants Facility Procedures : CPT4 Code: 10272536 Description: 99213 - WOUND CARE VISIT-LEV 3 EST PT Modifier: Quantity: 1 : CPT4 Code: 64403474 Description: 97597 - DEBRIDE WOUND 1ST 20 SQ CM OR < ICD-10 Diagnosis Description L97.822 Non-pressure chronic ulcer of other part of left lower leg with fat layer expose Modifier: d Quantity: 1 : CPT4 Code: 25956387 Description: 56433 - DEBRIDE WOUND EA ADDL 20 SQ CM ICD-10 Diagnosis Description L97.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 W10.932 Non-pressure chronic ulcer of other part of left lower leg with fat layer exposed L97.512 Non-pressure chronic ulcer of other part of right foot with fat layer exposed Quantity: 1 pdf Page 12 of 12 : [id number] 97597 - WC PHYS DEBR WO ANESTH 20 SQ CM 1 ICD-10 Diagnosis Description L97.822 Non-pressure chronic ulcer of other part of left lower leg with fat layer exposed Quantity: : [id number] 97598 - WC PHYS DEBR WO ANESTH EA ADD 20 CM 5 ICD-10 Diagnosis Description L97.822 Non-pressure chronic ulcer of other part of left lower leg with fat layer exposed Quantity: Electronic Signature(s) Signed: 09/15/2023 10:18:18 AM By: Elizabeth [name] Signed: 09/15/2023 11:25:01 AM By: Elizabeth Derry PA-C Previous Signature: 09/15/2023 10:14:22 AM Version By: Elizabeth Derry PA-C Entered By: Elizabeth [name] on 09/15/2023 10:18:18

## 2023-09-19 ENCOUNTER — Other Ambulatory Visit (HOSPITAL_COMMUNITY): Payer: Self-pay

## 2023-09-19 ENCOUNTER — Telehealth: Payer: Self-pay

## 2023-09-19 ENCOUNTER — Telehealth: Payer: Self-pay | Admitting: Pharmacist

## 2023-09-19 DIAGNOSIS — E11622 Type 2 diabetes mellitus with other skin ulcer: Secondary | ICD-10-CM | POA: Diagnosis not present

## 2023-09-19 NOTE — Progress Notes (Signed)
Elizabeth [name], Elizabeth [name] ([id number]) [id number] Page 1 of 2 Visit Report for 09/19/2023 Physician Orders Details Patient Name: Date of Service: Elizabeth [name], [place] Elizabeth LYN L. 09/19/2023 11:00 A M Medical Record Number: [id number] Patient Account Number: [id number] Date of Birth/Sex: Treating RN: 1951/07/05 (72 y.o. [name] Primary Care Provider: Darreld Mclean Other Clinician: Referring Provider: Treating Provider/Extender: Salvatore Decent in Treatment: 0 The following information was scribed by: Angelina Pih The information was scribed for: Allen Derry Verbal / Phone Orders: No Diagnosis Coding Follow-up Appointments Return Appointment in 1 week. Nurse Visit as needed - PRN - to re-wrap likely Monday 09/19/23 Bathing/ Shower/ Hygiene May shower with wound dressing protected with water repellent cover or cast protector. No tub bath. Anesthetic (Use 'Patient Medications' Section for Anesthetic Order Entry) Lidocaine applied to wound bed Edema Control - Orders / Instructions Elevate, Exercise Daily and A void Standing for Long Periods of Time. Elevate leg(s) parallel to the floor when sitting. DO YOUR BEST to sleep in the bed at night. DO NOT sleep in your recliner. Long hours of sitting in a recliner leads to swelling of the legs and/or potential wounds on your backside. Wound Treatment Wound #6 - Lower Leg Wound Laterality: Right, Medial Cleanser: Soap and Water 2 x Per Week/15 Days Discharge Instructions: Gently cleanse wound with antibacterial soap, rinse and pat dry prior to dressing wounds Cleanser: Wound Cleanser 2 x Per Week/15 Days Discharge Instructions: Wash your hands with soap and water. Remove old dressing, discard into plastic bag and place into trash. Cleanse the wound with Wound Cleanser prior to applying a clean dressing using gauze sponges, not tissues or cotton balls. Do not scrub or use excessive force. Pat dry using gauze  sponges, not tissue or cotton balls. Prim Dressing: Silvercel 4 1/4x 4 1/4 (in/in) 2 x Per Week/15 Days ary Discharge Instructions: Apply Silvercel 4 1/4x 4 1/4 (in/in) as instructed Secondary Dressing: Zetuvit Plus 4x8 (in/in) 2 x Per Week/15 Days Compression Wrap: Urgo K2 Lite, two layer compression system, large 2 x Per Week/15 Days Wound #7 - Lower Leg Wound Laterality: Left Cleanser: Soap and Water 2 x Per Week/15 Days Discharge Instructions: Gently cleanse wound with antibacterial soap, rinse and pat dry prior to dressing wounds Cleanser: Wound Cleanser 2 x Per Week/15 Days Discharge Instructions: Wash your hands with soap and water. Remove old dressing, discard into plastic bag and place into trash. Cleanse the wound with Wound Cleanser prior to applying a clean dressing using gauze sponges, not tissues or cotton balls. Do not scrub or use excessive force. Pat dry using gauze sponges, not tissue or cotton balls. Prim Dressing: Silvercel 4 1/4x 4 1/4 (in/in) 2 x Per Week/15 Days ary Discharge Instructions: Apply Silvercel 4 1/4x 4 1/4 (in/in) as instructed Secondary Dressing: Zetuvit Plus 4x8 (in/in) 2 x Per Week/15 Days Elizabeth [name], Elizabeth [name] ([id number]) [id number] Page 2 of 2 Compression Wrap: Urgo K2 Lite, two layer compression system, large 2 x Per Week/15 Days Laboratory Bacteria identified in Wound by Culture (MICRO) LOINC Code: [id number] Convenience Name: Wound culture routine Electronic Signature(s) Signed: 09/19/2023 4:31:01 PM By: Allen Derry PA-C Signed: 09/19/2023 4:54:56 PM By: Midge Aver MSN RN CNS WTA Previous Signature: 09/19/2023 11:51:16 AM Version By: Midge Aver MSN RN CNS WTA Entered By: Midge Aver on 09/19/2023 08:54:44 -------------------------------------------------------------------------------- SuperBill Details Patient Name: Date of Service: Elizabeth [name], [place] Elizabeth LYN L. 09/19/2023 Medical Record Number: [id number] Patient Account  Number: [id number] Date of  Birth/Sex: Treating RN: 02/11/1951 (72 y.o. 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 Modifier: Quantity: 1 Electronic Signature(s) Signed: 09/19/2023 11:55:22 AM By: Midge Aver MSN RN CNS WTA Signed: 09/19/2023 4:31:01 PM By: Allen Derry PA-C Previous Signature: 09/19/2023 11:53:19 AM Version By: Midge Aver MSN RN CNS WTA Entered By: Midge Aver on 09/19/2023 08:55:22

## 2023-09-19 NOTE — Telephone Encounter (Signed)
Patient Advocate Encounter  Patient will be starting on Vyndaqel. This patient may be eligible for a grant that would cover the cost of the medication to a $0 copay.  Attempted to contact patient by phone to confirm household size, income, and consent to apply.  No answer, left voicemail. Will try again.  Burnell Blanks, CPhT Rx Patient Advocate Phone: [phone]

## 2023-09-19 NOTE — Telephone Encounter (Signed)
Prior authorization started for tafamidis per Dr. Elwyn Lade. Key is [id number]. Patient's insurance preferrs Vyndaqel over [place]. [place] approved through 11/07/2024.

## 2023-09-19 NOTE — Progress Notes (Addendum)
Elizabeth, [name] ([id number]) [id number] Page 1 of 6 Visit Report for 09/19/2023 Arrival Information Details Patient Name: Date of Service: Elizabeth [name], [place] [name] 09/19/2023 11:00 A M Medical Record Number: [id number] Patient Account Number: [id number] Date of Birth/Sex: Treating RN: [birth year] (72 y.o. Elizabeth [name] Primary Care Elizabeth [name]: Elizabeth [name] Other Clinician: Referring Cuauhtemoc Huegel: Treating Oluwanifemi Susman/Extender: Elizabeth [name] in Treatment: 0 Visit Information History Since Last Visit Added or deleted any medications: No Patient Arrived: Elizabeth [name] Any new allergies or adverse reactions: No Arrival Time: 11:49 Has Dressing in Place as Prescribed: Yes Accompanied By: self Has Compression in Place as Prescribed: Yes Transfer Assistance: None Pain Present Now: No Patient Identification Verified: Yes Secondary Verification Process Completed: Yes Patient Requires Transmission-Based Precautions: No Patient Has Alerts: No Electronic Signature(s) Signed: 09/19/2023 11:54:03 AM By: Midge Aver MSN RN CNS WTA Previous Signature: 09/19/2023 11:49:35 AM Version By: Midge Aver MSN RN CNS WTA Entered By: Midge Aver on 09/19/2023 08:54:03 -------------------------------------------------------------------------------- Clinic Level of Care Assessment Details Patient Name: Date of Service: Elizabeth [name], [place] [name] 09/19/2023 11:00 A M Medical Record Number: [id number] Patient Account Number: [id number] Date of Birth/Sex: Treating RN: [birth year] (72 y.o. Elizabeth [name] Primary Care Elizabeth [name]: Elizabeth [name] Other Clinician: Referring Elizabeth [name]: Treating Elizabeth [name]/Extender: Elizabeth [name] in Treatment: 0 Clinic Level of Care Assessment Items TOOL 1 Quantity Score []  - 0 Use when EandM and Procedure is performed on INITIAL visit ASSESSMENTS - Nursing Assessment / Reassessment []  - 0 General Physical Exam (combine w/  comprehensive assessment (listed just below) when performed on new pt. evals) []  - 0 Comprehensive Assessment (HX, ROS, Risk Assessments, Wounds Hx, etc.) ASSESSMENTS - Wound and Skin Assessment / Reassessment []  - 0 Dermatologic / Skin Assessment (not related to wound area) ASSESSMENTS - Ostomy and/or Continence Assessment and Care PAISLEE, MARCHIONDA ([id number]) [id number] Page 2 of 6 []  - 0 Incontinence Assessment and Management []  - 0 Ostomy Care Assessment and Management (repouching, etc.) PROCESS - Coordination of Care []  - 0 Simple Patient / Family Education for ongoing care []  - 0 Complex (extensive) Patient / Family Education for ongoing care []  - 0 Staff obtains Chiropractor, Records, T Results / Process Orders est []  - 0 Staff telephones HHA, Nursing Homes / Clarify orders / etc []  - 0 Routine Transfer to another Facility (non-emergent condition) []  - 0 Routine Hospital Admission (non-emergent condition) []  - 0 New Admissions / Manufacturing engineer / Ordering NPWT Apligraf, etc. , []  - 0 Emergency Hospital Admission (emergent condition) PROCESS - Special Needs []  - 0 Pediatric / Minor Patient Management []  - 0 Isolation Patient Management []  - 0 Hearing / Language / Visual special needs []  - 0 Assessment of Community assistance (transportation, D/C planning, etc.) []  - 0 Additional assistance / Altered mentation []  - 0 Support Surface(s) Assessment (bed, cushion, seat, etc.) INTERVENTIONS - Miscellaneous []  - 0 External ear exam []  - 0 Patient Transfer (multiple staff / Nurse, adult / Similar devices) []  - 0 Simple Staple / Suture removal (25 or less) []  - 0 Complex Staple / Suture removal (26 or more) []  - 0 Hypo/Hyperglycemic Management (do not check if billed separately) []  - 0 Ankle / Brachial Index (ABI) - do not check if billed separately Has the patient been seen at the hospital within the last three years: Yes Total  Score: 0 Level Of Care: ____ Electronic Signature(s) Signed: 09/19/2023 4:54:56 PM By: Midge Aver MSN RN  CNS WTA Entered By: Midge Aver on 09/19/2023 08:55:11 -------------------------------------------------------------------------------- Compression Therapy Details Patient Name: Date of Service: Elizabeth [name], [place] [name] 09/19/2023 11:00 A M Medical Record Number: [id number] Patient Account Number: [id number] Date of Birth/Sex: Treating RN: 1951/03/20 (72 y.o. Elizabeth [name] Primary Care Jessy Calixte: Elizabeth [name] Other Clinician: Referring Elizabeth [name]: Treating Elizabeth [name]/Extender: Elizabeth [name] in Treatment: 0 Compression Therapy Performed for Wound Assessment: Wound #6 Right,Medial Lower Leg Performed By: Elizabeth Janus, RN Compression Type: Four Jabier Mutton ([id number]) [id number] Page 3 of 6 Electronic Signature(s) Signed: 09/19/2023 11:50:46 AM By: Midge Aver MSN RN CNS WTA Entered By: Midge Aver on 09/19/2023 08:50:46 -------------------------------------------------------------------------------- Compression Therapy Details Patient Name: Date of Service: Elizabeth [name] [name] 09/19/2023 11:00 A M Medical Record Number: [id number] Patient Account Number: [id number] Date of Birth/Sex: Treating RN: 1951/09/29 (72 y.o. Elizabeth [name] Primary Care Elizabeth [name]: Elizabeth [name] Other Clinician: Referring Elizabeth [name]: Treating Elizabeth [name]/Extender: Elizabeth [name] in Treatment: 0 Compression Therapy Performed for Wound Assessment: Wound #7 Left Lower Leg Performed By: Clinician Midge Aver, RN Compression Type: Four Layer Electronic Signature(s) Signed: 09/19/2023 11:50:46 AM By: Midge Aver MSN RN CNS WTA Entered By: Midge Aver on 09/19/2023 08:50:46 -------------------------------------------------------------------------------- Encounter Discharge Information Details Patient Name: Date of Service: Elizabeth [name], [place] [name] 09/19/2023 11:00 A M Medical Record Number: [id number] Patient Account Number: [id number] Date of Birth/Sex: Treating RN: May 12, 1951 (71 y.o. Elizabeth [name] Primary Care Elizabeth Basham: Elizabeth [name] Other Clinician: Referring Maico Mulvehill: Treating Anandi Abramo/Extender: Elizabeth [name] in Treatment: 0 Encounter Discharge Information Items Discharge Condition: Stable Ambulatory Status: Stretcher Discharge Destination: Home Transportation: Private Auto Accompanied By: self Schedule Follow-up Appointment: Yes Clinical Summary of Care: Electronic Signature(s) Signed: 09/19/2023 11:55:03 AM By: Midge Aver MSN RN CNS WTA Previous Signature: 09/19/2023 11:51:38 AM Version By: Midge Aver MSN RN CNS WTA Entered By: Midge Aver on 09/19/2023 08:55:03 [name] ([id number]) [id number] Page 4 of 6 -------------------------------------------------------------------------------- Wound Assessment Details Patient Name: Date of Service: Elizabeth [name], [place] [name] 09/19/2023 11:00 A M Medical Record Number: [id number] Patient Account Number: [id number] Date of Birth/Sex: Treating RN: 03-May-1951 (71 y.o. Elizabeth [name] Primary Care Rokhaya Quinn: Elizabeth [name] Other Clinician: Referring Cephas Revard: Treating Keyoni Lapinski/Extender: Elizabeth [name] in Treatment: 0 Wound Status Wound Number: 6 Primary Lymphedema Etiology: Wound Location: Right, Medial Lower Leg Wound Open Wounding Event: Blister Status: Date Acquired: 08/29/2023 Comorbid Lymphedema, Asthma, Arrhythmia, Hypertension, Peripheral Weeks Of Treatment: 0 History: Venous Disease, Type II Diabetes, History of pressure wounds, Clustered Wound: No Osteomyelitis, Neuropathy, Seizure Disorder, Received Radiation Wound Measurements Length: (cm) 10 Width: (cm) 11 Depth: (cm) 0.1 Area: (cm) 86.394 Volume: (cm) 8.639 % Reduction in Area: 0% % Reduction in Volume:  0% Epithelialization: None Wound Description Classification: Full Thickness Without Exposed Support Structures Exudate Amount: Large Exudate Type: Serosanguineous Exudate Color: red, brown Foul Odor After Cleansing: No Slough/Fibrino Yes Wound Bed Granulation Amount: Large (67-100%) Exposed Structure Granulation Quality: Red, Pink Fat Layer (Subcutaneous Tissue) Exposed: Yes Necrotic Amount: Small (1-33%) Necrotic Quality: Adherent Slough Treatment Notes Wound #6 (Lower Leg) Wound Laterality: Right, Medial Cleanser Soap and Water Discharge Instruction: Gently cleanse wound with antibacterial soap, rinse and pat dry prior to dressing wounds Wound Cleanser Discharge Instruction: Wash your hands with soap and water. Remove old dressing, discard into plastic bag and place into trash. Cleanse the wound with Wound Cleanser prior to applying a clean  dressing using gauze sponges, not tissues or cotton balls. Do not scrub or use excessive force. Pat dry using gauze sponges, not tissue or cotton balls. Peri-Wound Care Topical Primary Dressing Silvercel 4 1/4x 4 1/4 (in/in) Discharge Instruction: Apply Silvercel 4 1/4x 4 1/4 (in/in) as instructed Secondary Dressing Zetuvit Plus 4x8 (in/in) Secured With Compression Wrap Urgo K2 Lite, two layer compression system, large Beckner, Zabrina L ([id number]) [id number] Page 5 of 6 Compression Stockings Add-Ons Electronic Signature(s) Signed: 09/19/2023 11:50:16 AM By: Midge Aver MSN RN CNS WTA Entered By: Midge Aver on 09/19/2023 08:50:16 -------------------------------------------------------------------------------- Wound Assessment Details Patient Name: Date of Service: Elizabeth [name], [place] [name] 09/19/2023 11:00 A M Medical Record Number: [id number] Patient Account Number: [id number] Date of Birth/Sex: Treating RN: 1951-01-12 (72 y.o. Elizabeth [name] Primary Care Mando Blatz: Elizabeth [name] Other  Clinician: Referring Furqan Gosselin: Treating Kristel Durkee/Extender: Elizabeth [name] in Treatment: 0 Wound Status Wound Number: 7 Primary Lymphedema Etiology: Wound Location: Left Lower Leg Wound Open Wounding Event: Blister Status: Date Acquired: 08/29/2023 Comorbid Lymphedema, Asthma, Arrhythmia, Hypertension, Peripheral Weeks Of Treatment: 0 History: Venous Disease, Type II Diabetes, History of pressure wounds, Clustered Wound: No Osteomyelitis, Neuropathy, Seizure Disorder, Received Radiation Wound Measurements Length: (cm) 10.5 Width: (cm) 17 Depth: (cm) 0.1 Area: (cm) 140.194 Volume: (cm) 14.019 % Reduction in Area: 0% % Reduction in Volume: 0% Epithelialization: None Wound Description Classification: Full Thickness Without Exposed Suppor Exudate Amount: Large Exudate Type: Serosanguineous Exudate Color: red, brown t Structures Foul Odor After Cleansing: No Slough/Fibrino Yes Wound Bed Granulation Amount: Large (67-100%) Exposed Structure Granulation Quality: Red, Pink Fat Layer (Subcutaneous Tissue) Exposed: Yes Necrotic Amount: Small (1-33%) Necrotic Quality: Adherent Slough Assessment Notes increased drainage, green in color, foul odor, PA Stone in to assess, wound culture completed. Treatment Notes Wound #7 (Lower Leg) Wound Laterality: Left Cleanser Soap and Water Discharge Instruction: Gently cleanse wound with antibacterial soap, rinse and pat dry prior to dressing wounds Wound Cleanser Discharge Instruction: Wash your hands with soap and water. Remove old dressing, discard into plastic bag and place into trash. Cleanse the wound with Wound Cleanser prior to applying a clean dressing using gauze sponges, not tissues or cotton balls. Do not scrub or use excessive force. Pat dry using gauze sponges, not tissue or cotton balls. Peri-Wound Care LETTICIA, TUZZOLINO ([id number]) [phone].pdf Page 6 of 6 Topical Primary  Dressing Silvercel 4 1/4x 4 1/4 (in/in) Discharge Instruction: Apply Silvercel 4 1/4x 4 1/4 (in/in) as instructed Secondary Dressing Zetuvit Plus 4x8 (in/in) Secured With Compression Wrap Urgo K2 Lite, two layer compression system, large Compression Stockings Add-Ons Electronic Signature(s) Signed: 09/19/2023 12:06:41 PM By: Angelina Pih Signed: 09/19/2023 4:54:56 PM By: Midge Aver MSN RN CNS WTA Previous Signature: 09/19/2023 11:50:21 AM Version By: Midge Aver MSN RN CNS WTA Entered By: Angelina Pih on 09/19/2023 09:06:41

## 2023-09-20 ENCOUNTER — Other Ambulatory Visit (HOSPITAL_COMMUNITY): Payer: Self-pay

## 2023-09-20 NOTE — Telephone Encounter (Signed)
Advanced Heart Failure Patient Advocate Encounter  The patient was approved for a [employer] grant that will help cover the cost of Vyndaqel.  Total amount awarded, $10,000.  Effective: 08/21/2023 - 08/19/2024.  BIN [id number] PCN [id number] Group [id number] ID [id number]  Pharmacy provided with approval and processing information. Patient informed via phone.  Burnell Blanks, CPhT Rx Patient Advocate Phone: [phone]

## 2023-09-22 ENCOUNTER — Other Ambulatory Visit (HOSPITAL_COMMUNITY): Payer: Self-pay

## 2023-09-22 ENCOUNTER — Encounter: Payer: Medicare PPO | Admitting: Physician Assistant

## 2023-09-22 DIAGNOSIS — E11622 Type 2 diabetes mellitus with other skin ulcer: Secondary | ICD-10-CM | POA: Diagnosis not present

## 2023-09-22 NOTE — Progress Notes (Signed)
ADVANCED HEART FAILURE FOLLOW UP CLINIC NOTE  Referring Physician: Leanna Sato, MD  Primary Care: Leanna Sato, MD Primary Cardiologist:  HPI: Elizabeth [name] is a 72 y.o. 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         First seen by cardiology in 07/2021 after having been admitted to the hospital in 04/2021 for TIA. Echo during that admission showed an EF of 60 to 65%, no regional wall motion abnormalities, normal LV diastolic function parameters, normal RV systolic function and ventricular cavity size, mild mitral regurgitation, and negative bubble study. Carotid artery ultrasound showed right greater than left carotid bifurcation atherosclerosis with less than 50% stenosis. Subsequent outpatient cardiac monitoring showed a predominant rhythm of sinus with first-degree AV block, second-degree AV block type I, 2 pauses with the longest being 3.4 seconds, rare atrial and ventricular ectopy, and no evidence of A-fib or flutter.   She was recently admitted in 06/2023 for a syncopal episode, was found to be in slow atrial fibrillation. No clear etiology for her stroke was identified. She was seen by EP with future plan for cardioversion and PPM placement, but she was found to be in heart failure so was referred to HF clinic. She was markedly volume overloaded but improving in clinic. She underwent a PYP scan that was read as grade 2.     SUBJECTIVE:  Patient overall states that she is doing fairly well.  She has been losing weight steadily, and is down a total of 25 pounds since she was first seen by cardiology.  We discussed her recent positive PYP  scan and how the diagnosis of TTR amyloid is likely contributing to her symptoms.  She denies any symptoms of dizziness lightheadedness.  She is currently seeing wound care due to concern for superinfection of her chronic lower extremity wounds.  She does note that her legs are feeling better.  She has a long history of polyneuropathy, but also has a known history of diabetes.  No prior history of amyloid in her family.  PMH, current medications, allergies, social history, and family history reviewed in epic.  PHYSICAL EXAM: Vitals:   09/23/23 1029  BP: 116/62  Pulse: (!) 59  SpO2: 100%   GENERAL: Chronically ill appearing HEENT: The mucous membranes are pink and moist.   PULM:  Normal work of breathing, clear to auscultation bilaterally. Respirations are unlabored.  CARDIAC:  JVP: Mildly elevated         Bradycardic, irregular rhythm, 2+ lymphedema to the knees ABDOMEN: Soft, non-tender, non-distended. NEUROLOGIC: Patient is oriented x3 with no focal or lateralizing neurologic deficits.  PSYCH: Patients affect is appropriate, there is no evidence of anxiety or depression.  SKIN: Warm and dry; no lesions or wounds. Warm and well perfused extremities.  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 Still diuresing well, will start tafamadis today. Increase diureiss given that she would benefit functionally from pacemaker placement and her lower extremity edema limits this. Repeat BMP today as well - Continue spironolactone 25mg  daily - Increase furosemide to 40mg   BID/daily alternating - Start tafamadis, appreciate pharmacy assistance from Dr. Weston Settle - TTR genetics panel sent - Consider administration of vutrisiran pending results given neuropathy and recent HELIOS-B trial - Follow up after scan - Compression stockings - Decreased lisinopril to 10mg  daily   Atrial fibrillation: Slow rate, likely will need pacemaker in the future. However, understandably holding off with healing lower extremity wounds.  - Continue apixaban, patient is compliant   Clearnce Hasten, MD Advanced Heart Failure Mechanical Circulatory Support 09/23/23

## 2023-09-22 NOTE — Progress Notes (Addendum)
Elizabeth [name] ([id number]) [id number] Page 1 of 11 Visit Report for 09/22/2023 Arrival Information Details Patient Name: Date of Service: Elizabeth [name], Elizabeth Elizabeth LYN [name]. 09/22/2023 10:30 A M Medical Record Number: [id number] Patient Account Number: [id number] Date of Birth/Sex: Treating RN: Jun 03, 1951 (72 y.o. Female) Elizabeth [name] Primary Care Elizabeth [name]: Elizabeth [name] Other Clinician: Referring Elizabeth [name]: Treating Elizabeth [name]/Extender: Elizabeth [name] in Treatment: 1 Visit Information History Since Last Visit Added or deleted any medications: No Patient Arrived: Elizabeth [name] Any new allergies or adverse reactions: No Arrival Time: 10:29 Had a fall or experienced change in No Accompanied By: self activities of daily living that may affect Transfer Assistance: None risk of falls: Patient Identification Verified: Yes Hospitalized since last visit: No Secondary Verification Process Completed: Yes Has Dressing in Place as Prescribed: Yes Patient Requires Transmission-Based Precautions: No Has Compression in Place as Prescribed: Yes Patient Has Alerts: No Pain Present Now: Yes Electronic Signature(s) Signed: 09/22/2023 4:29:52 PM By: Elizabeth [name] Entered By: Elizabeth [name] on 09/22/2023 10:29:31 -------------------------------------------------------------------------------- Clinic Level of Care Assessment Details Patient Name: Date of Service: Elizabeth [name], [place] Elizabeth LYN [name]. 09/22/2023 10:30 A M Medical Record Number: [id number] Patient Account Number: [id number] Date of Birth/Sex: Treating RN: 1951-08-27 (72 y.o. Female) Elizabeth [name] Primary Care Genevieve Arbaugh: Elizabeth [name] Other Clinician: Referring Nyair Depaulo: Treating Elizabeth [name]/Extender: Elizabeth [name] in Treatment: 1 Clinic Level of Care Assessment Items TOOL 1 Quantity Score []  - 0 Use when EandM and Procedure is performed on INITIAL visit ASSESSMENTS - Nursing Assessment /  Reassessment []  - 0 General Physical Exam (combine w/ comprehensive assessment (listed just below) when performed on new pt. evals) []  - 0 Comprehensive Assessment (HX, ROS, Risk Assessments, Wounds Hx, etc.) ASSESSMENTS - Wound and Skin Assessment / Reassessment []  - 0 Dermatologic / Skin Assessment (not related to wound area) Elizabeth [name] ([id number]) [id number] Page 2 of 11 ASSESSMENTS - Ostomy and/or Continence Assessment and Care []  - 0 Incontinence Assessment and Management []  - 0 Ostomy Care Assessment and Management (repouching, etc.) PROCESS - Coordination of Care []  - 0 Simple Patient / Family Education for ongoing care []  - 0 Complex (extensive) Patient / Family Education for ongoing care []  - 0 Staff obtains Chiropractor, Records, T Results / Process Orders est []  - 0 Staff telephones HHA, Nursing Homes / Clarify orders / etc []  - 0 Routine Transfer to another Facility (non-emergent condition) []  - 0 Routine Hospital Admission (non-emergent condition) []  - 0 New Admissions / Manufacturing engineer / Ordering NPWT Apligraf, etc. , []  - 0 Emergency Hospital Admission (emergent condition) PROCESS - Special Needs []  - 0 Pediatric / Minor Patient Management []  - 0 Isolation Patient Management []  - 0 Hearing / Language / Visual special needs []  - 0 Assessment of Community assistance (transportation, D/C planning, etc.) []  - 0 Additional assistance / Altered mentation []  - 0 Support Surface(s) Assessment (bed, cushion, seat, etc.) INTERVENTIONS - Miscellaneous []  - 0 External ear exam []  - 0 Patient Transfer (multiple staff / Nurse, adult / Similar devices) []  - 0 Simple Staple / Suture removal (25 or less) []  - 0 Complex Staple / Suture removal (26 or more) []  - 0 Hypo/Hyperglycemic Management (do not check if billed separately) []  - 0 Ankle / Brachial Index (ABI) - do not check if billed separately Has the patient been seen  at the hospital within the last three years: Yes Total Score: 0 Level Of Care: ____ Electronic Signature(s) Signed: 09/22/2023 4:29:52  PM By: Elizabeth [name] Entered By: Elizabeth [name] on 09/22/2023 12:51:27 -------------------------------------------------------------------------------- Compression Therapy Details Patient Name: Date of Service: Elizabeth [name], [place] Elizabeth LYN [name]. 09/22/2023 10:30 A M Medical Record Number: [id number] Patient Account Number: [id number] Date of Birth/Sex: Treating RN: November 23, 1950 (72 y.o. Female) Elizabeth [name] Primary Care Olivya Sobol: Elizabeth [name] Other Clinician: Referring Hillari Zumwalt: Treating Jamiria Langill/Extender: Elizabeth [name] in Treatment: 1 Compression Therapy Performed for Wound Assessment: Wound #6 Right,Medial Lower Leg Performed By: Elizabeth Bodily, RN Compression Type: Double Jabier Mutton ([id number]) [id number] Page 3 of 11 Post Procedure Diagnosis Same as Pre-procedure Electronic Signature(s) Signed: 09/22/2023 4:29:52 PM By: Elizabeth [name] Entered By: Elizabeth [name] on 09/22/2023 11:08:00 -------------------------------------------------------------------------------- Compression Therapy Details Patient Name: Date of Service: Elizabeth [name], [place] Elizabeth LYN [name]. 09/22/2023 10:30 A M Medical Record Number: [id number] Patient Account Number: [id number] Date of Birth/Sex: Treating RN: 06-07-1951 (72 y.o. Female) Elizabeth [name] Primary Care Amori Cooperman: Elizabeth [name] Other Clinician: Referring Korayma Hagwood: Treating Aldeen Riga/Extender: Elizabeth [name] in Treatment: 1 Compression Therapy Performed for Wound Assessment: Wound #7 Left Lower Leg Performed By: Clinician Elizabeth Pih, RN Compression Type: Double Layer Post Procedure Diagnosis Same as Pre-procedure Electronic Signature(s) Signed: 09/22/2023 4:29:52 PM By: Elizabeth [name] Entered By: Elizabeth [name] on 09/22/2023  11:08:00 -------------------------------------------------------------------------------- Encounter Discharge Information Details Patient Name: Date of Service: Elizabeth [name], [place] Elizabeth LYN [name]. 09/22/2023 10:30 A M Medical Record Number: [id number] Patient Account Number: [id number] Date of Birth/Sex: Treating RN: October 28, 1951 (72 y.o. Female) Elizabeth [name] Primary Care Canna Nickelson: Elizabeth [name] Other Clinician: Referring Samanthajo Payano: Treating Margaree Sandhu/Extender: Elizabeth [name] in Treatment: 1 Encounter Discharge Information Items Discharge Condition: Stable Ambulatory Status: Cane Discharge Destination: Home Transportation: Private Auto Accompanied By: self/son in [place] Schedule Follow-up Appointment: Yes Clinical Summary of Care: Elizabeth [name], Elizabeth [name] ([id number]) [id number] Page 4 of 11 Electronic Signature(s) Signed: 09/22/2023 12:52:36 PM By: Elizabeth [name] Entered By: Elizabeth [name] on 09/22/2023 12:52:36 -------------------------------------------------------------------------------- Lower Extremity Assessment Details Patient Name: Date of Service: Elizabeth [name], [place] Elizabeth LYN [name]. 09/22/2023 10:30 A M Medical Record Number: [id number] Patient Account Number: [id number] Date of Birth/Sex: Treating RN: June 16, 1951 (72 y.o. Female) Elizabeth [name] Primary Care Jessen Siegman: Elizabeth [name] Other Clinician: Referring Jakori Burkett: Treating Roshawna Colclasure/Extender: Elizabeth [name] in Treatment: 1 Edema Assessment Assessed: [Left: No] [Right: No] Edema: [Left: Yes] [Right: Yes] Calf Left: Right: Point of Measurement: 32 cm From Medial Instep 41.5 cm 41 cm Ankle Left: Right: Point of Measurement: 10 cm From Medial Instep 26.2 cm 26.6 cm Vascular Assessment Pulses: Dorsalis Pedis Palpable: [Left:Yes] [Right:Yes] Extremity colors, hair growth, and conditions: Extremity Color: [Left:Red] [Right:Red] Hair Growth on Extremity: [Left:Yes] [Right:Yes] Temperature of  Extremity: [Left:Warm < 3 seconds] [Right:Warm < 3 seconds] Toe Nail Assessment Left: Right: Thick: Yes Yes Discolored: Yes Yes Deformed: No No Improper Length and Hygiene: No No Electronic Signature(s) Signed: 09/22/2023 4:29:52 PM By: Elizabeth [name] Entered By: Elizabeth [name] on 09/22/2023 10:41:11 Elizabeth [name] ([id number]) [id number] Page 5 of 11 -------------------------------------------------------------------------------- Multi Wound Chart Details Patient Name: Date of Service: Elizabeth [name], [place] Elizabeth LYN [name]. 09/22/2023 10:30 A M Medical Record Number: [id number] Patient Account Number: [id number] Date of Birth/Sex: Treating RN: [birth year] (72 y.o. Female) Elizabeth [name] Primary Care Lyndsie Wallman: Elizabeth [name] Other Clinician: Referring Henny Strauch: Treating Ritisha Deitrick/Extender: Elizabeth [name] in Treatment: 1 Vital Signs Height(in): 63 Pulse(bpm): 61 Weight(lbs): 197 Blood Pressure(mmHg): 113/75 Body Mass Index(BMI): 34.9 Temperature(F): 97.7 Respiratory Rate(breaths/min): 18 [6:Photos:] [N/A:N/A] Right,  Medial Lower Leg Left Lower Leg N/A Wound Location: Blister Blister N/A Wounding Event: Lymphedema Lymphedema N/A Primary Etiology: Lymphedema, Asthma, Arrhythmia, Lymphedema, Asthma, Arrhythmia, N/A Comorbid History: Hypertension, Peripheral Venous Hypertension, Peripheral Venous Disease, Type II Diabetes, History of Disease, Type II Diabetes, History of pressure wounds, Osteomyelitis, pressure wounds, Osteomyelitis, Neuropathy, Seizure Disorder, Neuropathy, Seizure Disorder, Received Radiation Received Radiation 08/29/2023 08/29/2023 N/A Date Acquired: 1 1 N/A Weeks of Treatment: Open Open N/A Wound Status: No No N/A Wound Recurrence: 7.4x7x0.1 10.5x14x0.1 N/A Measurements [name] x W x D (cm) 40.684 115.454 N/A A (cm) : rea 4.068 11.545 N/A Volume (cm) : 52.90% 17.60% N/A % Reduction in Area: 52.90% 17.60% N/A %  Reduction in Volume: Full Thickness Without Exposed Full Thickness Without Exposed N/A Classification: Support Structures Support Structures Large Large N/A Exudate A mount: Serosanguineous Serosanguineous N/A Exudate Type: red, brown red, brown N/A Exudate Color: Large (67-100%) Large (67-100%) N/A Granulation A mount: Red, Pink, Friable Red, Pink, Friable N/A Granulation Quality: Small (1-33%) Small (1-33%) N/A Necrotic A mount: Fat Layer (Subcutaneous Tissue): Yes Fat Layer (Subcutaneous Tissue): Yes N/A Exposed Structures: None None N/A EpithelializationGITANJALI, Elizabeth [name] ([id number]) [id number] Page 6 of 11 Treatment Notes Electronic Signature(s) Signed: 09/22/2023 4:29:52 PM By: Elizabeth [name] Entered By: Elizabeth [name] on 09/22/2023 11:07:48 -------------------------------------------------------------------------------- Multi-Disciplinary Care Plan Details Patient Name: Date of Service: Elizabeth [name], [place] Elizabeth LYN [name]. 09/22/2023 10:30 A M Medical Record Number: [id number] Patient Account Number: [id number] Date of Birth/Sex: Treating RN: 06/08/1951 (72 y.o. Female) Elizabeth [name] Primary Care Laray Rivkin: Elizabeth [name] Other Clinician: Referring Merriel Zinger: Treating Jamyia Fortune/Extender: Elizabeth [name] in Treatment: 1 Active Inactive Necrotic Tissue Nursing Diagnoses: Impaired tissue integrity related to necrotic/devitalized tissue Knowledge deficit related to management of necrotic/devitalized tissue Goals: Necrotic/devitalized tissue will be minimized in the wound bed Date Initiated: 09/15/2023 Target Resolution Date: 11/09/2023 Goal Status: Active Patient/caregiver will verbalize understanding of reason and process for debridement of necrotic tissue Date Initiated: 09/15/2023 Date Inactivated: 09/15/2023 Target Resolution Date: 09/14/2023 Goal Status: Met Interventions: Assess patient pain level pre-, during and post procedure and  prior to discharge Provide education on necrotic tissue and debridement process Treatment Activities: Apply topical anesthetic as ordered : 09/15/2023 Excisional debridement : 09/15/2023 Notes: Wound/Skin Impairment Nursing Diagnoses: Impaired tissue integrity Knowledge deficit related to ulceration/compromised skin integrity Goals: Ulcer/skin breakdown will have a volume reduction of 30% by week 4 Date Initiated: 09/15/2023 Target Resolution Date: 10/12/2023 Goal Status: Active Ulcer/skin breakdown will have a volume reduction of 50% by week 8 Date Initiated: 09/15/2023 Target Resolution Date: 11/09/2023 Goal Status: Active Ulcer/skin breakdown will have a volume reduction of 80% by week 12 Date Initiated: 09/15/2023 Target Resolution Date: 12/07/2023 Goal Status: Active Ulcer/skin breakdown will heal within 14 weeks Date Initiated: 09/15/2023 Target Resolution Date: 12/21/2023 Elizabeth [name], Elizabeth [name] ([id number]) [phone].pdf Page 7 of 11 Goal Status: Active Interventions: Assess patient/caregiver ability to obtain necessary supplies Assess patient/caregiver ability to perform ulcer/skin care regimen upon admission and as needed Assess ulceration(s) every visit Provide education on smoking Provide education on ulcer and skin care Notes: Electronic Signature(s) Signed: 09/22/2023 12:51:45 PM By: Elizabeth [name] Entered By: Elizabeth [name] on 09/22/2023 12:51:45 -------------------------------------------------------------------------------- Pain Assessment Details Patient Name: Date of Service: Elizabeth [name], [place] Elizabeth LYN [name]. 09/22/2023 10:30 A M Medical Record Number: [id number] Patient Account Number: [id number] Date of Birth/Sex: Treating RN: [birth year] (72 y.o. Female) Elizabeth [name] Primary Care Ica Daye: Elizabeth [name] Other Clinician: Referring Theda Payer: Treating Ragnar Waas/Extender: Elizabeth [name]  in Treatment: 1 Active Problems Location of Pain  Severity and Description of Pain Patient Has Paino Yes Site Locations Rate the pain. Current Pain Level: 8 Pain Management and Medication Current Pain Management: Electronic Signature(s) Signed: 09/22/2023 4:29:52 PM By: Elizabeth [name] Entered By: Elizabeth [name] on 09/22/2023 10:29:56 Elizabeth [name] ([id number]) [id number] Page 8 of 11 -------------------------------------------------------------------------------- Patient/Caregiver Education Details Patient Name: Date of Service: Elizabeth [name],  Elizabeth LYN [name]. 11/14/2024andnbsp10:30 A M Medical Record Number: [id number] Patient Account Number: [id number] Date of Birth/Gender: Treating RN: 1951/03/16 (72 y.o. Female) Elizabeth [name] Primary Care Physician: Elizabeth [name] Other Clinician: Referring Physician: Treating Physician/Extender: Elizabeth [name] in Treatment: 1 Education Assessment Education Provided To: Patient Education Topics Provided Wound/Skin Impairment: Handouts: Caring for Your Ulcer Methods: Explain/Verbal Responses: State content correctly Electronic Signature(s) Signed: 09/22/2023 4:29:52 PM By: Elizabeth [name] Entered By: Elizabeth [name] on 09/22/2023 12:51:57 -------------------------------------------------------------------------------- Wound Assessment Details Patient Name: Date of Service: Elizabeth [name], [place] Elizabeth LYN [name]. 09/22/2023 10:30 A M Medical Record Number: [id number] Patient Account Number: [id number] Date of Birth/Sex: Treating RN: 1951/07/31 (72 y.o. Female) Elizabeth [name] Primary Care Adil Tugwell: Elizabeth [name] Other Clinician: Referring Audwin Semper: Treating Natsuko Kelsay/Extender: Elizabeth [name] in Treatment: 1 Wound Status Wound Number: 6 Primary Lymphedema Etiology: Wound Location: Right, Medial Lower Leg Wound Open Wounding Event: Blister Status: Date Acquired: 08/29/2023 Comorbid Lymphedema, Asthma, Arrhythmia, Hypertension, Peripheral Weeks  Of Treatment: 1 History: Venous Disease, Type II Diabetes, History of pressure wounds, Clustered Wound: No Osteomyelitis, Neuropathy, Seizure Disorder, Received Radiation Photos AYN, CARRITHERS ([id number]) [id number] Page 9 of 11 Wound Measurements Length: (cm) 7.4 Width: (cm) 7 Depth: (cm) 0.1 Area: (cm) 40.684 Volume: (cm) 4.068 % Reduction in Area: 52.9% % Reduction in Volume: 52.9% Epithelialization: None Tunneling: No Undermining: No Wound Description Classification: Full Thickness Without Exposed Support Structures Exudate Amount: Large Exudate Type: Serosanguineous Exudate Color: red, brown Foul Odor After Cleansing: No Slough/Fibrino Yes Wound Bed Granulation Amount: Large (67-100%) Exposed Structure Granulation Quality: Red, Pink, Friable Fat Layer (Subcutaneous Tissue) Exposed: Yes Necrotic Amount: Small (1-33%) Necrotic Quality: Adherent Slough Treatment Notes Wound #6 (Lower Leg) Wound Laterality: Right, Medial Cleanser Soap and Water Discharge Instruction: Gently cleanse wound with antibacterial soap, rinse and pat dry prior to dressing wounds Wound Cleanser Discharge Instruction: Wash your hands with soap and water. Remove old dressing, discard into plastic bag and place into trash. Cleanse the wound with Wound Cleanser prior to applying a clean dressing using gauze sponges, not tissues or cotton balls. Do not scrub or use excessive force. Pat dry using gauze sponges, not tissue or cotton balls. Peri-Wound Care Topical Primary Dressing Curity Oil Emulsion Dressing, 5x9 (in/in) Secondary Dressing Zetuvit Plus 4x8 (in/in) Secured With Compression Wrap Urgo K2 Lite, two layer compression system, large Compression Stockings Add-Ons Electronic Signature(s) Signed: 09/22/2023 4:29:52 PM By: Elizabeth [name] Entered By: Elizabeth [name] on 09/22/2023 10:45:37 Elizabeth [name] ([id number]) [id number] Page  10 of 11 -------------------------------------------------------------------------------- Wound Assessment Details Patient Name: Date of Service: Elizabeth [name], [place] Elizabeth LYN [name]. 09/22/2023 10:30 A M Medical Record Number: [id number] Patient Account Number: [id number] Date of Birth/Sex: Treating RN: Aug 10, 1951 (72 y.o. Female) Elizabeth [name] Primary Care Izzabelle Bouley: Elizabeth [name] Other Clinician: Referring Devante Capano: Treating Aquan Kope/Extender: Elizabeth [name] in Treatment: 1 Wound Status Wound Number: 7 Primary Lymphedema Etiology: Wound Location: Left Lower Leg Wound Open Wounding Event: Blister Status: Date Acquired: 08/29/2023 Comorbid Lymphedema, Asthma, Arrhythmia, Hypertension, Peripheral Weeks Of Treatment: 1 History: Venous  Disease, Type II Diabetes, History of pressure wounds, Clustered Wound: No Osteomyelitis, Neuropathy, Seizure Disorder, Received Radiation Photos Wound Measurements Length: (cm) 10.5 Width: (cm) 14 Depth: (cm) 0.1 Area: (cm) 115.454 Volume: (cm) 11.545 % Reduction in Area: 17.6% % Reduction in Volume: 17.6% Epithelialization: None Tunneling: No Undermining: No Wound Description Classification: Full Thickness Without Exposed Support Structures Exudate Amount: Large Exudate Type: Serosanguineous Exudate Color: red, brown Foul Odor After Cleansing: No Slough/Fibrino Yes Wound Bed Granulation Amount: Large (67-100%) Exposed Structure Granulation Quality: Red, Pink, Friable Fat Layer (Subcutaneous Tissue) Exposed: Yes Necrotic Amount: Small (1-33%) Necrotic Quality: Adherent Slough Treatment Notes Wound #7 (Lower Leg) Wound Laterality: Left Cleanser Soap and Water Discharge Instruction: Gently cleanse wound with antibacterial soap, rinse and pat dry prior to dressing wounds Wound Cleanser Discharge Instruction: Wash your hands with soap and water. Remove old dressing, discard into plastic bag and place into trash. Cleanse the wound  with Wound Cleanser prior to applying a clean dressing using gauze sponges, not tissues or cotton balls. Do not scrub or use excessive force. Pat dry using gauze sponges, not tissue or cotton balls. Elizabeth [name], Elizabeth [name] ([id number]) [id number] Page 11 of 11 Peri-Wound Care Topical Primary Dressing Curity Oil Emulsion Dressing, 5x9 (in/in) Secondary Dressing Zetuvit Plus 4x8 (in/in) Secured With Compression Wrap Urgo K2 Lite, two layer compression system, large Compression Stockings Add-Ons Electronic Signature(s) Signed: 09/22/2023 4:29:52 PM By: Elizabeth [name] Entered By: Elizabeth [name] on 09/22/2023 10:45:27 -------------------------------------------------------------------------------- Vitals Details Patient Name: Date of Service: Elizabeth [name], [place] Elizabeth LYN [name]. 09/22/2023 10:30 A M Medical Record Number: [id number] Patient Account Number: [id number] Date of Birth/Sex: Treating RN: [birth year] (72 y.o. Female) Elizabeth [name] Primary Care Victorya Hillman: Elizabeth [name] Other Clinician: Referring Raad Clayson: Treating Eligah Anello/Extender: Elizabeth [name] in Treatment: 1 Vital Signs Time Taken: 10:29 Temperature (F): 97.7 Height (in): 63 Pulse (bpm): 61 Weight (lbs): 197 Respiratory Rate (breaths/min): 18 Body Mass Index (BMI): 34.9 Blood Pressure (mmHg): 113/75 Reference Range: 80 - 120 mg / dl Electronic Signature(s) Signed: 09/22/2023 4:29:52 PM By: Elizabeth [name] Entered By: Elizabeth [name] on 09/22/2023 10:29:49

## 2023-09-22 NOTE — Progress Notes (Addendum)
[name], CUN ([id number]) [id number] Page 1 of 7 Visit Report for 09/22/2023 Chief Complaint Document Details Patient Name: Date of Service: Elizabeth [name], Elizabeth Elizabeth LYN L. 09/22/2023 10:30 A M Medical Record Number: [id number] Patient Account Number: [id number] Date of Birth/Sex: Treating RN: [birth year] (72 y.o. Female) Angelina Pih Primary Care Provider: Darreld Mclean Other Clinician: Referring Provider: Treating Provider/Extender: Salvatore Decent in Treatment: 1 Information Obtained from: Patient Chief Complaint Bilateral LE Ulcers Electronic Signature(s) Signed: 09/22/2023 10:05:28 AM By: Allen Derry PA-C Entered By: Allen Derry on 09/22/2023 10:05:28 -------------------------------------------------------------------------------- HPI Details Patient Name: Date of Service: Elizabeth [name], Elizabeth Elizabeth LYN L. 09/22/2023 10:30 A M Medical Record Number: [id number] Patient Account Number: [id number] Date of Birth/Sex: Treating RN: [birth year] (72 y.o. Female) Angelina Pih Primary Care Provider: Darreld Mclean Other Clinician: Referring Provider: Treating Provider/Extender: Salvatore Decent in Treatment: 1 History of Present Illness HPI Description: Readmission: 09-15-2023 upon evaluation today patient presents for readmission here in the clinic although she was seen just recently although it was for an area on her breast. Subsequently at this point patient's wound actually is on her legs she has bilateral lower extremity lymphedema and she has bilateral lower extremity swelling. She seems to have good arterial flow visually although we could not perform ABIs today due to the location of the wounds. Patient states this started about 2 weeks prior as far as the weeping is concerned she does not wear compression socks as they are too tight to get on she tells me. Patient does have a history of diabetes mellitus type 2, diabetic neuropathy,  chronic kidney disease stage IIIb, atrial fibrillation and she is on Eliquis for anticoagulation. Her most recent hemoglobin A1c was 6.5. 09-22-2023 upon evaluation patient appears to be doing better in regard to both wounds from a sinus perspective she also does have the results of the culture today which I went over with her and showed Pseudomonas that the predominant organism. Subsequently we will get a go ahead and address this as rapidly as possible I am going to go ahead and get her started on Levaquin and I want to get that started today. Electronic Signature(s) SHEROL, CANTARERO ([id number]) [id number] Page 2 of 7 Signed: 09/22/2023 5:19:41 PM By: Allen Derry PA-C Entered By: Allen Derry on 09/22/2023 17:19:41 -------------------------------------------------------------------------------- Physical Exam Details Patient Name: Date of Service: Elizabeth [name], Elizabeth Elizabeth LYN L. 09/22/2023 10:30 A M Medical Record Number: [id number] Patient Account Number: [id number] Date of Birth/Sex: Treating RN: 1951/02/02 (72 y.o. Female) Angelina Pih Primary Care Provider: Darreld Mclean Other Clinician: Referring Provider: Treating Provider/Extender: Salvatore Decent in Treatment: 1 Constitutional Well-nourished and well-hydrated in no acute distress. Respiratory normal breathing without difficulty. Psychiatric this patient is able to make decisions and demonstrates good insight into disease process. Alert and Oriented x 3. pleasant and cooperative. Notes Upon inspection patient's wound bed actually showed signs of still having drainage but I think she is doing better compared to previous I am going to send in the Levaquin today and I think that are doing really well for her. Electronic Signature(s) Signed: 09/22/2023 5:19:52 PM By: Allen Derry PA-C Entered By: Allen Derry on 09/22/2023  17:19:52 -------------------------------------------------------------------------------- Physician Orders Details Patient Name: Date of Service: Elizabeth [name], Elizabeth Elizabeth LYN L. 09/22/2023 10:30 A M Medical Record Number: [id number] Patient Account Number: [id number] Date of Birth/Sex: Treating RN: 09/08/1951 (72 y.o. Female) Angelina Pih Primary Care Provider: Darreld Mclean Other  Clinician: Referring Provider: Treating Provider/Extender: Salvatore Decent in Treatment: 1 The following information was scribed by: Angelina Pih The information was scribed for: Allen Derry Verbal / Phone Orders: No Diagnosis Coding ICD-10 Coding Code Description I89.0 Lymphedema, not elsewhere classified BURNITA, TRYBUS ([id number]) [id number] Page 3 of 7 E11.622 Type 2 diabetes mellitus with other skin ulcer L97.822 Non-pressure chronic ulcer of other part of left lower leg with fat layer exposed L97.512 Non-pressure chronic ulcer of other part of right foot with 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 Nurse Visit as needed - PRN - to re-wrap likely Monday Bathing/ Shower/ Hygiene May shower with wound dressing protected with water repellent cover or cast protector. No tub bath. Anesthetic (Use 'Patient Medications' Section for Anesthetic Order Entry) Lidocaine applied to wound bed Edema Control - Orders / Instructions Elevate, Exercise Daily and A void Standing for Long Periods of Time. Elevate leg(s) parallel to the floor when sitting. DO YOUR BEST to sleep in the bed at night. DO NOT sleep in your recliner. Long hours of sitting in a recliner leads to swelling of the legs and/or potential wounds on your backside. Wound  Treatment Wound #6 - Lower Leg Wound Laterality: Right, Medial Cleanser: Soap and Water 2 x Per Week/15 Days Discharge Instructions: Gently cleanse wound with antibacterial soap, rinse and pat dry prior to dressing wounds Cleanser: Wound Cleanser 2 x Per Week/15 Days Discharge Instructions: Wash your hands with soap and water. Remove old dressing, discard into plastic bag and place into trash. Cleanse the wound with Wound Cleanser prior to applying a clean dressing using gauze sponges, not tissues or cotton balls. Do not scrub or use excessive force. Pat dry using gauze sponges, not tissue or cotton balls. Prim Dressing: Curity Oil Emulsion Dressing, 5x9 (in/in) ary 2 x Per Week/15 Days Secondary Dressing: Zetuvit Plus 4x8 (in/in) 2 x Per Week/15 Days Compression Wrap: Urgo K2 Lite, two layer compression system, large 2 x Per Week/15 Days Wound #7 - Lower Leg Wound Laterality: Left Cleanser: Soap and Water 2 x Per Week/15 Days Discharge Instructions: Gently cleanse wound with antibacterial soap, rinse and pat dry prior to dressing wounds Cleanser: Wound Cleanser 2 x Per Week/15 Days Discharge Instructions: Wash your hands with soap and water. Remove old dressing, discard into plastic bag and place into trash. Cleanse the wound with Wound Cleanser prior to applying a clean dressing using gauze sponges, not tissues or cotton balls. Do not scrub or use excessive force. Pat dry using gauze sponges, not tissue or cotton balls. Prim Dressing: Curity Oil Emulsion Dressing, 5x9 (in/in) ary 2 x Per Week/15 Days Secondary Dressing: Zetuvit Plus 4x8 (in/in) 2 x Per Week/15 Days Compression Wrap: Urgo K2 Lite, two layer compression system, large 2 x Per Week/15 Days Patient Medications llergies: No Known Allergies A Notifications Medication Indication Start End 09/22/2023 levofloxacin DOSE 1 - oral 750 mg tablet - 1 tablet oral once daily x 14 days Electronic Signature(s) Signed: 09/22/2023  4:29:52 PM By: Angelina Pih Signed: 09/22/2023 5:33:14 PM By: Allen Derry PA-C Previous Signature: 09/22/2023 12:11:04 PM Version By: Allen Derry PA-C Entered By: Angelina Pih on 09/22/2023 12:51:21 [name] ([id number]) [id number] Page 4 of 7 -------------------------------------------------------------------------------- Problem List Details Patient Name: Date of Service: Elizabeth [name], Elizabeth Elizabeth LYN L. 09/22/2023  10:30 A M Medical Record Number: [id number] Patient Account Number: [id number] Date of Birth/Sex: Treating RN: [birth year] (72 y.o. 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 09/15/2023 No Yes N18.32 Chronic kidney disease, stage 3b 09/15/2023 No Yes I48.0 Paroxysmal atrial fibrillation 09/15/2023 No Yes Z79.01 Long term (current) use of anticoagulants 09/15/2023 No Yes Inactive Problems Resolved Problems Electronic Signature(s) Signed: 09/22/2023 10:05:20 AM By: Allen Derry PA-C Entered By: Allen Derry on 09/22/2023 10:05:20 [name] ([id number]) [id number] Page 5 of 7 -------------------------------------------------------------------------------- Progress Note Details Patient Name: Date of Service: Elizabeth [name], Elizabeth Elizabeth LYN L. 09/22/2023 10:30 A M Medical Record Number: [id number] Patient Account  Number: [id number] Date of Birth/Sex: Treating RN: 1950-11-15 (72 y.o. Female) Angelina Pih Primary Care Provider: Darreld Mclean Other Clinician: Referring Provider: Treating Provider/Extender: Salvatore Decent in Treatment: 1 Subjective Chief Complaint Information obtained from Patient Bilateral LE Ulcers History of Present Illness (HPI) Readmission: 09-15-2023 upon evaluation today patient presents for readmission here in the clinic although she was seen just recently although it was for an area on her breast. Subsequently at this point patient's wound actually is on her legs she has bilateral lower extremity lymphedema and she has bilateral lower extremity swelling. She seems to have good arterial flow visually although we could not perform ABIs today due to the location of the wounds. Patient states this started about 2 weeks prior as far as the weeping is concerned she does not wear compression socks as they are too tight to get on she tells me. Patient does have a history of diabetes mellitus type 2, diabetic neuropathy, chronic kidney disease stage IIIb, atrial fibrillation and she is on Eliquis for anticoagulation. Her most recent hemoglobin A1c was 6.5. 09-22-2023 upon evaluation patient appears to be doing better in regard to both wounds from a sinus perspective she also does have the results of the culture today which I went over with her and showed Pseudomonas that the predominant organism. Subsequently we will get a go ahead and address this as rapidly as possible I am going to go ahead and get her started on Levaquin and I want to get that started today. Objective Constitutional Well-nourished and well-hydrated in no acute distress. Vitals Time Taken: 10:29 AM, Height: 63 in, Weight: 197 lbs, BMI: 34.9, Temperature: 97.7 F, Pulse: 61 bpm, Respiratory Rate: 18 breaths/min, Blood Pressure: 113/75 mmHg. Respiratory normal breathing without  difficulty. Psychiatric this patient is able to make decisions and demonstrates good insight into disease process. Alert and Oriented x 3. pleasant and cooperative. General Notes: Upon inspection patient's wound bed actually showed signs of still having drainage but I think she is doing better compared to previous I am going to send in the Levaquin today and I think that are doing really well for her. Integumentary (Hair, Skin) Wound #6 status is Open. Original cause of wound was Blister. The date acquired was: 08/29/2023. The wound has been in treatment 1 weeks. The wound is  located on the Right,Medial Lower Leg. The wound measures 7.4cm length x 7cm width x 0.1cm depth; 40.684cm^2 area and 4.068cm^3 volume. There is Fat Layer (Subcutaneous Tissue) exposed. There is no tunneling or undermining noted. There is a large amount of serosanguineous drainage noted. There is large (67-100%) red, pink, friable granulation within the wound bed. There is a small (1-33%) amount of necrotic tissue within the wound bed including Adherent Slough. Wound #7 status is Open. Original cause of wound was Blister. The date acquired was: 08/29/2023. The wound has been in treatment 1 weeks. The wound is located on the Left Lower Leg. The wound measures 10.5cm length x 14cm width x 0.1cm depth; 115.454cm^2 area and 11.545cm^3 volume. There is Fat Layer (Subcutaneous Tissue) exposed. There is no tunneling or undermining noted. There is a large amount of serosanguineous drainage noted. There is large (67- 100%) red, pink, friable granulation within the wound bed. There is a small (1-33%) amount of necrotic tissue within the wound bed including Adherent Slough. 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 There was a Double Layer Compression Therapy Procedure by Angelina Pih, RN. Post procedure Diagnosis Wound #6: Same as Pre-Procedure Wound #7 Pre-procedure diagnosis of Wound #7 is a Lymphedema located on the Left Lower Leg . There was a Double Layer Compression Therapy Procedure by Angelina Pih, RN. Post procedure Diagnosis Wound #7: Same as Pre-Procedure Plan Follow-up Appointments: Return Appointment in 1 week. Nurse Visit as needed - PRN - to re-wrap likely Monday Bathing/ Shower/ Hygiene: May shower with wound dressing protected with water repellent cover or cast protector. No tub bath. Anesthetic (Use 'Patient Medications' Section for Anesthetic Order Entry): Lidocaine applied to wound bed Edema Control - Orders / Instructions: Elevate, Exercise Daily and Avoid Standing for Long Periods of Time. Elevate leg(s) parallel to the floor when sitting. DO YOUR BEST to sleep in the bed at night. DO NOT sleep in your recliner. Long hours of sitting in a recliner leads to swelling of the legs and/or potential wounds on your backside. The following medication(s) was prescribed: levofloxacin oral 750 mg tablet 1 1 tablet oral once daily x 14 days starting 09/22/2023 WOUND #6: - Lower Leg Wound Laterality: Right, Medial Cleanser: Soap and Water 2 x Per Week/15 Days Discharge Instructions: Gently cleanse wound with antibacterial soap, rinse and pat dry prior to dressing wounds Cleanser: Wound Cleanser 2 x Per Week/15 Days Discharge Instructions: Wash your hands with soap and water. Remove old dressing, discard  into plastic bag and place into trash. Cleanse the wound with Wound Cleanser prior to applying a clean dressing using gauze sponges, not tissues or cotton balls. Do not scrub or use excessive force. Pat dry using gauze sponges, not tissue or cotton balls. Prim Dressing: Curity Oil Emulsion Dressing, 5x9 (in/in) 2 x Per Week/15 Days ary Secondary Dressing: Zetuvit Plus 4x8 (in/in) 2 x Per Week/15 Days Com pression Wrap: Urgo K2 Lite, two layer compression system, large 2 x Per Week/15 Days WOUND #7: -  Lower Leg Wound Laterality: Left Cleanser: Soap and Water 2 x Per Week/15 Days Discharge Instructions: Gently cleanse wound with antibacterial soap, rinse and pat dry prior to dressing wounds Cleanser: Wound Cleanser 2 x Per Week/15 Days Discharge Instructions: Wash your hands with soap and water. Remove old dressing, discard into plastic bag and place into trash. Cleanse the wound with Wound Cleanser prior to applying a clean dressing using gauze sponges, not tissues or cotton balls. Do not scrub or use excessive force. Pat dry using gauze sponges, not tissue or cotton balls. Prim Dressing: Curity Oil Emulsion Dressing, 5x9 (in/in) 2 x Per Week/15 Days ary Secondary Dressing: Zetuvit Plus 4x8 (in/in) 2 x Per Week/15 Days Com pression Wrap: Urgo K2 Lite, two layer compression system, large 2 x Per Week/15 Days 1. I would recommend the patient should continue to monitor for any signs of infection or worsening. Based on what I am seeing I do believe that she is making really good headway here towards closure and I am very pleased in that regard. 2. I would recommend that we continue with the oil emulsion over the wounds to keep them from sticking and then subsequently we will also go ahead and use Zetuvit over top of this along with Urgo K2 light compression wrap to help with edema control. 3. I did send in Levaquin for her today and I think getting this started as quickly as possible is good to be  the best thing is soon as we get the infection under control I think she is good to feel much better and her wounds are go to look much better. We will see patient back for reevaluation in 1 week here in the clinic. If anything worsens or changes patient will contact our office for additional recommendations. DELECIA, CUADRA ([id number]) [id number] Page 7 of 7 Electronic Signature(s) Signed: 09/22/2023 5:20:44 PM By: Allen Derry PA-C Entered By: Allen Derry on 09/22/2023 17:20:43 -------------------------------------------------------------------------------- SuperBill Details Patient Name: Date of Service: Elizabeth [name], Elizabeth Elizabeth LYN L. 09/22/2023 Medical Record Number: [id number] Patient Account Number: [id number] Date of Birth/Sex: Treating RN: 01-04-1951 (72 y.o. 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 Modifier: Quantity: 1 Physician Procedures : CPT4 Code Description Modifier [id number] - WC PHYS LEVEL 4 - EST PT ICD-10 Diagnosis Description I89.0 Lymphedema, not elsewhere classified E11.622 Type 2 diabetes mellitus with other skin ulcer  L97.822 Non-pressure chronic ulcer of other part of  left lower leg with fat layer exposed L97.512 Non-pressure chronic ulcer of other part of right foot with fat layer exposed Quantity: 1 Electronic Signature(s) Signed: 09/22/2023 5:21:02 PM By: Allen Derry PA-C Previous Signature: 09/22/2023 12:51:39 PM Version By: Angelina Pih Entered By: Allen Derry on 09/22/2023 17:21:02

## 2023-09-23 ENCOUNTER — Other Ambulatory Visit: Payer: Self-pay | Admitting: Pharmacist

## 2023-09-23 ENCOUNTER — Ambulatory Visit: Payer: Medicare PPO | Admitting: Physician Assistant

## 2023-09-23 ENCOUNTER — Other Ambulatory Visit: Payer: Self-pay

## 2023-09-23 ENCOUNTER — Ambulatory Visit: Payer: Medicare PPO | Admitting: Cardiology

## 2023-09-23 ENCOUNTER — Other Ambulatory Visit
Admission: RE | Admit: 2023-09-23 | Discharge: 2023-09-23 | Disposition: A | Discharge status: Home / Self Care | Payer: Medicare PPO | Source: Ambulatory Visit | Attending: Cardiology | Admitting: Cardiology

## 2023-09-23 VITALS — BP 116/62 | HR 59 | Wt 192.0 lb

## 2023-09-23 DIAGNOSIS — I5032 Chronic diastolic (congestive) heart failure: Secondary | ICD-10-CM | POA: Diagnosis not present

## 2023-09-23 DIAGNOSIS — I43 Cardiomyopathy in diseases classified elsewhere: Secondary | ICD-10-CM

## 2023-09-23 DIAGNOSIS — E854 Organ-limited amyloidosis: Secondary | ICD-10-CM | POA: Diagnosis not present

## 2023-09-23 LAB — BASIC METABOLIC PANEL
Anion gap: 9 (ref 5–15)
BUN: 41 mg/dL — ABNORMAL HIGH (ref 8–23)
CO2: 20 mmol/L — ABNORMAL LOW (ref 22–32)
Calcium: 8.8 mg/dL — ABNORMAL LOW (ref 8.9–10.3)
Chloride: 109 mmol/L (ref 98–111)
Creatinine, Ser: 2.34 mg/dL — ABNORMAL HIGH (ref 0.44–1.00)
GFR, Estimated: 22 mL/min — ABNORMAL LOW (ref >60)
Glucose, Bld: 97 mg/dL (ref 70–99)
Potassium: 5.3 mmol/L — ABNORMAL HIGH (ref 3.5–5.1)
Sodium: 138 mmol/L (ref 135–145)

## 2023-09-23 MED ORDER — TAFAMIDIS MEGLUMINE (CARDIAC) 20 MG PO CAPS
80.0000 mg | ORAL_CAPSULE | Freq: Every day | ORAL | 11 refills | Status: DC
  Filled 2023-09-26: qty 120, 30d supply, fill #0
  Filled 2023-10-13: qty 120, 30d supply, fill #1

## 2023-09-23 MED ORDER — LISINOPRIL 10 MG PO TABS: 10.0000 mg | ORAL_TABLET | Freq: Every day | ORAL | 3 refills | Status: DC

## 2023-09-23 MED ORDER — FUROSEMIDE 40 MG PO TABS: 40.0000 mg | ORAL_TABLET | Freq: Two times a day (BID) | ORAL | 3 refills | Status: DC

## 2023-09-23 NOTE — Progress Notes (Signed)
Specialty Pharmacy Initiation Note   Elizabeth [name] is a 72 y.o. female who will be followed by the specialty pharmacy service for RxSp Cardiology    Review of administration, indication, effectiveness, safety, potential side effects, storage/disposable, and missed dose instructions occurred today for patient's specialty medication(s) No data recorded    Patient/Caregiver did not have any additional questions or concerns.   Patient's therapy is appropriate to: Initiate    Goals Addressed             This Visit's Progress    Disease Progression Prevented or Minimized   On track    The goal of treatment for the patient is to maintain feeling well and prevent progression of disease.       Benefits Investigation Summary Prescription: New Therapy Dispensing pharmacy: [facility] Copay amount: $0 Prior Auth and Med Assistance Notes:   Received [employer] grant:  BIN [id number] PCN [id number] Group [id number] ID [id number]  PA approved [id number]      :

## 2023-09-23 NOTE — Patient Instructions (Signed)
DECREASE YOUR LISINOPRIL TO 10 MG ONCE DAILY  INCREASE YOUR LASIX TO 40 MG TWICE DAILY  [facility] over [facility]. Go pass the gift shop and have your blood work completed.  We will call you if results are abnormal.  The providers' schedules are NOT open yet for 2025. We will call you closer to that time to schedule your 2 month f/u.

## 2023-09-26 ENCOUNTER — Other Ambulatory Visit: Payer: Self-pay

## 2023-09-26 ENCOUNTER — Encounter (HOSPITAL_COMMUNITY): Payer: Self-pay

## 2023-09-26 ENCOUNTER — Other Ambulatory Visit (HOSPITAL_COMMUNITY): Payer: Self-pay

## 2023-09-26 DIAGNOSIS — E11622 Type 2 diabetes mellitus with other skin ulcer: Secondary | ICD-10-CM | POA: Diagnosis not present

## 2023-09-26 NOTE — Progress Notes (Signed)
Specialty Pharmacy Initial Fill Coordination Note  Elizabeth [name] is a 72 y.o. female contacted today regarding refills of specialty medication(s) Tafamidis Meglumine (Cardiac)   Patient requested Delivery   Delivery date: 09/28/23   Verified address: [address] [place] [address]   Medication will be filled on 09/27/2023.   Patient is aware of $0 copayment.

## 2023-09-26 NOTE — Progress Notes (Signed)
TEAYA, BENZA ([id number]) [id number] Page 1 of 6 Visit Report for 09/26/2023 Arrival Information Details Patient Name: Date of Service: Elizabeth [name], Swisher Elizabeth [name]. 09/26/2023 2:00 PM Medical Record Number: [id number] Patient Account Number: [id number] Date of Birth/Sex: Treating RN: October 02, 1951 (72 y.o. Elizabeth [name] Primary Care Kandra Graven: Darreld Mclean Other Clinician: Referring Aldine Chakraborty: Treating Delfino Friesen/Extender: Salvatore Decent in Treatment: 1 Visit Information History Since Last Visit Added or deleted any medications: No Patient Arrived: [place] Mor Any new allergies or adverse reactions: No Arrival Time: 14:27 Had a fall or experienced change in No Accompanied By: self activities of daily living that may affect Transfer Assistance: None risk of falls: Patient Identification Verified: Yes Hospitalized since last visit: No Secondary Verification Process Completed: Yes Has Dressing in Place as Prescribed: Yes Patient Requires Transmission-Based Precautions: No Has Compression in Place as Prescribed: Yes Patient Has Alerts: No Pain Present Now: Yes Electronic Signature(s) Signed: 09/26/2023 2:27:40 PM By: Angelina Pih Entered By: Angelina Pih on 09/26/2023 11:27:40 -------------------------------------------------------------------------------- Clinic Level of Care Assessment Details Patient Name: Date of Service: Elizabeth [name], [place] Elizabeth [name]. 09/26/2023 2:00 PM Medical Record Number: [id number] Patient Account Number: [id number] Date of Birth/Sex: Treating RN: 09/26/1951 (72 y.o. Elizabeth [name] Primary Care Lorretta Kerce: Darreld Mclean Other Clinician: Referring Zuleima Haser: Treating Dai Mcadams/Extender: Salvatore Decent in Treatment: 1 Clinic Level of Care Assessment Items TOOL 1 Quantity Score []  - 0 Use when EandM and Procedure is performed on INITIAL visit ASSESSMENTS - Nursing Assessment / Reassessment []  -  0 General Physical Exam (combine w/ comprehensive assessment (listed just below) when performed on new pt. evals) []  - 0 Comprehensive Assessment (HX, ROS, Risk Assessments, Wounds Hx, etc.) ASSESSMENTS - Wound and Skin Assessment / Reassessment []  - 0 Dermatologic / Skin Assessment (not related to wound area) Elizabeth [name] ([id number]) [id number] Page 2 of 6 ASSESSMENTS - Ostomy and/or Continence Assessment and Care []  - 0 Incontinence Assessment and Management []  - 0 Ostomy Care Assessment and Management (repouching, etc.) PROCESS - Coordination of Care []  - 0 Simple Patient / Family Education for ongoing care []  - 0 Complex (extensive) Patient / Family Education for ongoing care []  - 0 Staff obtains Chiropractor, Records, T Results / Process Orders est []  - 0 Staff telephones HHA, Nursing Homes / Clarify orders / etc []  - 0 Routine Transfer to another Facility (non-emergent condition) []  - 0 Routine Hospital Admission (non-emergent condition) []  - 0 New Admissions / Manufacturing engineer / Ordering NPWT Apligraf, etc. , []  - 0 Emergency Hospital Admission (emergent condition) PROCESS - Special Needs []  - 0 Pediatric / Minor Patient Management []  - 0 Isolation Patient Management []  - 0 Hearing / Language / Visual special needs []  - 0 Assessment of Community assistance (transportation, D/C planning, etc.) []  - 0 Additional assistance / Altered mentation []  - 0 Support Surface(s) Assessment (bed, cushion, seat, etc.) INTERVENTIONS - Miscellaneous []  - 0 External ear exam []  - 0 Patient Transfer (multiple staff / Nurse, adult / Similar devices) []  - 0 Simple Staple / Suture removal (25 or less) []  - 0 Complex Staple / Suture removal (26 or more) []  - 0 Hypo/Hyperglycemic Management (do not check if billed separately) []  - 0 Ankle / Brachial Index (ABI) - do not check if billed separately Has the patient been seen at the hospital  within the last three years: Yes Total Score: 0 Level Of Care: ____ Electronic Signature(s) Unsigned Entered By: Angelina Pih  on 09/26/2023 11:34:25 -------------------------------------------------------------------------------- Compression Therapy Details Patient Name: Date of Service: Elizabeth [name], [place] Elizabeth [name]. 09/26/2023 2:00 PM Medical Record Number: [id number] Patient Account Number: [id number] Date of Birth/Sex: Treating RN: November 30, 1950 (72 y.o. Elizabeth [name] Primary Care Cadance Raus: Darreld Mclean Other Clinician: Referring Lincy Belles: Treating Kaena Santori/Extender: Salvatore Decent in Treatment: 1 Compression Therapy Performed for Wound Assessment: Wound #6 Right,Medial Lower Leg Elizabeth [name], Elizabeth [name] ([id number]) [phone].pdf Page 3 of 6 Performed By: Clinician Angelina Pih, RN Compression Type: Double Layer Electronic Signature(s) Signed: 09/26/2023 2:28:23 PM By: Angelina Pih Entered By: Angelina Pih on 09/26/2023 11:28:23 -------------------------------------------------------------------------------- Compression Therapy Details Patient Name: Date of Service: Elizabeth [name], [place] Elizabeth [name]. 09/26/2023 2:00 PM Medical Record Number: [id number] Patient Account Number: [id number] Date of Birth/Sex: Treating RN: Nov 22, 1950 (72 y.o. Elizabeth [name] Primary Care Gadge Hermiz: Darreld Mclean Other Clinician: Referring Adison Jerger: Treating Roc Streett/Extender: Salvatore Decent in Treatment: 1 Compression Therapy Performed for Wound Assessment: Wound #7 Left Lower Leg Performed By: Clinician Angelina Pih, RN Compression Type: Double Layer Electronic Signature(s) Signed: 09/26/2023 2:28:23 PM By: Angelina Pih Entered By: Angelina Pih on 09/26/2023 11:28:23 -------------------------------------------------------------------------------- Encounter Discharge Information Details Patient Name: Date of Service: Elizabeth [name], [place] Elizabeth LYN  [name]. 09/26/2023 2:00 PM Medical Record Number: [id number] Patient Account Number: [id number] Date of Birth/Sex: Treating RN: 11-02-1951 (72 y.o. Elizabeth [name] Primary Care Markevion Lattin: Darreld Mclean Other Clinician: Referring Tanishi Nault: Treating Aleatha Taite/Extender: Salvatore Decent in Treatment: 1 Encounter Discharge Information Items Discharge Condition: Stable Ambulatory Status: Cane Discharge Destination: Home Transportation: Private Auto Accompanied By: self Schedule Follow-up Appointment: Yes Clinical Summary of Care: Electronic Signature(s) Signed: 09/26/2023 2:34:18 PM By: Angelina Pih Entered By: Angelina Pih on 09/26/2023 11:34:18 Elizabeth [name] ([id number]) [id number] Page 4 of 6 -------------------------------------------------------------------------------- Wound Assessment Details Patient Name: Date of Service: Elizabeth [name], [place] Elizabeth [name]. 09/26/2023 2:00 PM Medical Record Number: [id number] Patient Account Number: [id number] Date of Birth/Sex: Treating RN: 04/15/1951 (72 y.o. Elizabeth [name] Primary Care Forbes Loll: Darreld Mclean Other Clinician: Referring Bennett Ram: Treating Sequan Auxier/Extender: Salvatore Decent in Treatment: 1 Wound Status Wound Number: 6 Primary Lymphedema Etiology: Wound Location: Right, Medial Lower Leg Wound Open Wounding Event: Blister Status: Date Acquired: 08/29/2023 Comorbid Lymphedema, Asthma, Arrhythmia, Hypertension, Peripheral Weeks Of Treatment: 1 History: Venous Disease, Type II Diabetes, History of pressure wounds, Clustered Wound: No Osteomyelitis, Neuropathy, Seizure Disorder, Received Radiation Wound Measurements Length: (cm) 7.4 Width: (cm) 7 Depth: (cm) 0.1 Area: (cm) 40.684 Volume: (cm) 4.068 % Reduction in Area: 52.9% % Reduction in Volume: 52.9% Epithelialization: None Wound Description Classification: Full Thickness Without Exposed Suppor Exudate Amount:  Large Exudate Type: Serosanguineous Exudate Color: red, brown t Structures Foul Odor After Cleansing: No Slough/Fibrino Yes Wound Bed Granulation Amount: Large (67-100%) Exposed Structure Granulation Quality: Red, Pink, Friable Fat Layer (Subcutaneous Tissue) Exposed: Yes Necrotic Amount: Small (1-33%) Necrotic Quality: Adherent Slough Treatment Notes Wound #6 (Lower Leg) Wound Laterality: Right, Medial Cleanser Soap and Water Discharge Instruction: Gently cleanse wound with antibacterial soap, rinse and pat dry prior to dressing wounds Wound Cleanser Discharge Instruction: Wash your hands with soap and water. Remove old dressing, discard into plastic bag and place into trash. Cleanse the wound with Wound Cleanser prior to applying a clean dressing using gauze sponges, not tissues or cotton balls. Do not scrub or use excessive force. Pat dry using gauze sponges, not tissue or cotton balls. Peri-Wound Care Topical Primary Dressing Elizabeth [name]  Emulsion Dressing, 5x9 (in/in) Secondary Dressing Zetuvit Plus 4x8 (in/in) Secured With Compression Wrap Urgo K2 Lite, two layer compression system, large Elizabeth [name], Elizabeth [name] ([id number]) [id number] Page 5 of 6 Compression Stockings Add-Ons Electronic Signature(s) Signed: 09/26/2023 2:28:01 PM By: Angelina Pih Entered By: Angelina Pih on 09/26/2023 11:28:00 -------------------------------------------------------------------------------- Wound Assessment Details Patient Name: Date of Service: Elizabeth [name], [place] Elizabeth [name]. 09/26/2023 2:00 PM Medical Record Number: [id number] Patient Account Number: [id number] Date of Birth/Sex: Treating RN: 1951-03-02 (72 y.o. Elizabeth [name] Primary Care Gabe Glace: Darreld Mclean Other Clinician: Referring Sachit Gilman: Treating Laurier Jasperson/Extender: Salvatore Decent in Treatment: 1 Wound Status Wound Number: 7 Primary Lymphedema Etiology: Wound Location: Left Lower  Leg Wound Open Wounding Event: Blister Status: Date Acquired: 08/29/2023 Comorbid Lymphedema, Asthma, Arrhythmia, Hypertension, Peripheral Weeks Of Treatment: 1 History: Venous Disease, Type II Diabetes, History of pressure wounds, Clustered Wound: No Osteomyelitis, Neuropathy, Seizure Disorder, Received Radiation Wound Measurements Length: (cm) 10.5 Width: (cm) 14 Depth: (cm) 0.1 Area: (cm) 115.454 Volume: (cm) 11.545 % Reduction in Area: 17.6% % Reduction in Volume: 17.6% Epithelialization: None Wound Description Classification: Full Thickness Without Exposed Suppor Exudate Amount: Large Exudate Type: Serosanguineous Exudate Color: red, brown t Structures Foul Odor After Cleansing: No Slough/Fibrino Yes Wound Bed Granulation Amount: Large (67-100%) Exposed Structure Granulation Quality: Red, Pink, Friable Fat Layer (Subcutaneous Tissue) Exposed: Yes Necrotic Amount: Small (1-33%) Necrotic Quality: Adherent Slough Treatment Notes Wound #7 (Lower Leg) Wound Laterality: Left Cleanser Soap and Water Discharge Instruction: Gently cleanse wound with antibacterial soap, rinse and pat dry prior to dressing wounds Wound Cleanser Discharge Instruction: Wash your hands with soap and water. Remove old dressing, discard into plastic bag and place into trash. Cleanse the wound with Wound Cleanser prior to applying a clean dressing using gauze sponges, not tissues or cotton balls. Do not scrub or use excessive force. Pat dry using gauze sponges, not tissue or cotton balls. Peri-Wound Care Topical ANAYELI, FELICIANO ([id number]) [phone].pdf Page 6 of 6 Primary Dressing Elizabeth [name] Emulsion Dressing, 5x9 (in/in) Secondary Dressing Zetuvit Plus 4x8 (in/in) Secured With Compression Wrap Urgo K2 Lite, two layer compression system, large Compression Stockings Add-Ons Electronic Signature(s) Signed: 09/26/2023 2:28:06 PM By: Angelina Pih Entered By: Angelina Pih on 09/26/2023 11:28:06

## 2023-09-27 ENCOUNTER — Other Ambulatory Visit: Payer: Self-pay

## 2023-09-27 NOTE — Progress Notes (Signed)
Elizabeth [name] ([id number]) [phone].pdf Page 1 of 2 Visit Report for 09/26/2023 Physician Orders Details Patient Name: Date of Service: Elizabeth [place], [place] [name] 09/26/2023 2:00 PM Medical Record Number: [id number] Patient Account Number: [id number] Date of Birth/Sex: Treating RN: [birth year] (72 y.o. Elizabeth [name] Primary Care Provider: Darreld Mclean Other Clinician: Referring Provider: Treating Provider/Extender: Elizabeth [name] in Treatment: 1 The following information was scribed by: Elizabeth [name] The information was scribed for: Elizabeth [name] Verbal / Phone Orders: No Diagnosis Coding Follow-up Appointments Return Appointment in 1 week. Nurse Visit as needed - PRN - to re-wrap likely Monday Bathing/ Shower/ Hygiene May shower with wound dressing protected with water repellent cover or cast protector. No tub bath. Anesthetic (Use 'Patient Medications' Section for Anesthetic Order Entry) Lidocaine applied to wound bed Edema Control - Orders / Instructions Elevate, Exercise Daily and A void Standing for Long Periods of Time. Elevate leg(s) parallel to the floor when sitting. DO YOUR BEST to sleep in the bed at night. DO NOT sleep in your recliner. Long hours of sitting in a recliner leads to swelling of the legs and/or potential wounds on your backside. Wound Treatment Wound #6 - Lower Leg Wound Laterality: Right, Medial Cleanser: Soap and Water 2 x Per Week/15 Days Discharge Instructions: Gently cleanse wound with antibacterial soap, rinse and pat dry prior to dressing wounds Cleanser: Wound Cleanser 2 x Per Week/15 Days Discharge Instructions: Wash your hands with soap and water. Remove old dressing, discard into plastic bag and place into trash. Cleanse the wound with Wound Cleanser prior to applying a clean dressing using gauze sponges, not tissues or cotton balls. Do not scrub or use excessive force. Pat dry using gauze sponges,  not tissue or cotton balls. Prim Dressing: Curity Oil Emulsion Dressing, 5x9 (in/in) ary 2 x Per Week/15 Days Secondary Dressing: Zetuvit Plus 4x8 (in/in) 2 x Per Week/15 Days Compression Wrap: Urgo K2 Lite, two layer compression system, large 2 x Per Week/15 Days Wound #7 - Lower Leg Wound Laterality: Left Cleanser: Soap and Water 2 x Per Week/15 Days Discharge Instructions: Gently cleanse wound with antibacterial soap, rinse and pat dry prior to dressing wounds Cleanser: Wound Cleanser 2 x Per Week/15 Days Discharge Instructions: Wash your hands with soap and water. Remove old dressing, discard into plastic bag and place into trash. Cleanse the wound with Wound Cleanser prior to applying a clean dressing using gauze sponges, not tissues or cotton balls. Do not scrub or use excessive force. Pat dry using gauze sponges, not tissue or cotton balls. Prim Dressing: Curity Oil Emulsion Dressing, 5x9 (in/in) ary 2 x Per Week/15 Days Secondary Dressing: Zetuvit Plus 4x8 (in/in) 2 x Per Week/15 Days Compression Wrap: Urgo K2 Lite, two layer compression system, large 2 x Per Week/15 Days Elizabeth [name] ([id number]) [phone].pdf Page 2 of 2 Electronic Signature(s) Signed: 09/26/2023 2:28:53 PM By: Elizabeth [name] Signed: 09/27/2023 4:00:09 PM By: Elizabeth Derry PA-C Entered By: Elizabeth [name] on 09/26/2023 11:28:53 -------------------------------------------------------------------------------- SuperBill Details Patient Name: Date of Service: Elizabeth [place], [place] [name] 09/26/2023 Medical Record Number: [id number] Patient Account Number: [id number] Date of Birth/Sex: Treating RN: 06/23/1951 (72 y.o. 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 Modifier: Quantity: 1 Electronic Signature(s) Signed: 09/26/2023 2:34:38 PM By: Elizabeth [name] Signed: 09/27/2023 4:00:09 PM By: Elizabeth Derry PA-C Entered By: Elizabeth [name] on 09/26/2023 11:34:37

## 2023-09-29 ENCOUNTER — Encounter: Payer: Medicare PPO | Admitting: Physician Assistant

## 2023-09-29 DIAGNOSIS — E11622 Type 2 diabetes mellitus with other skin ulcer: Secondary | ICD-10-CM | POA: Diagnosis not present

## 2023-09-29 NOTE — Progress Notes (Addendum)
Elizabeth [name], Elizabeth [name] ([id number]) [id number].pdf Page 1 of 7 Visit Report for 09/29/2023 Chief Complaint Document Details Patient Name: Date of Service: Elizabeth [name], Elizabeth [name]. 09/29/2023 2:00 PM Medical Record Number: [id number] Patient Account Number: [id number] Date of Birth/Sex: Treating RN: Mar 19, 1951 (72 y.o. Elizabeth [name] Primary Care Provider: Darreld Mclean Other Clinician: Referring Provider: Treating Provider/Extender: Salvatore Decent in Treatment: 2 Information Obtained from: Patient Chief Complaint Bilateral LE Ulcers Electronic Signature(s) Signed: 09/29/2023 2:14:07 PM By: Allen Derry PA-C Entered By: Allen Derry on 09/29/2023 11:14:07 -------------------------------------------------------------------------------- HPI Details Patient Name: Date of Service: Elizabeth [name], Elizabeth [name]. 09/29/2023 2:00 PM Medical Record Number: [id number] Patient Account Number: [id number] Date of Birth/Sex: Treating RN: 15-Jun-1951 (72 y.o. Elizabeth [name], Luther Parody Primary Care Provider: Darreld Mclean Other Clinician: Referring Provider: Treating Provider/Extender: Salvatore Decent in Treatment: 2 History of Present Illness HPI Description: Readmission: 09-15-2023 upon evaluation today patient presents for readmission here in the clinic although she was seen just recently although it was for an area on her breast. Subsequently at this point patient's wound actually is on her legs she has bilateral lower extremity lymphedema and she has bilateral lower extremity swelling. She seems to have good arterial flow visually although we could not perform ABIs today due to the location of the wounds. Patient states this started about 2 weeks prior as far as the weeping is concerned she does not wear compression socks as they are too tight to get on she tells me. Patient does have a history of diabetes mellitus type 2, diabetic neuropathy, chronic kidney  disease stage IIIb, atrial fibrillation and she is on Eliquis for anticoagulation. Her most recent hemoglobin A1c was 6.5. 09-22-2023 upon evaluation patient appears to be doing better in regard to both wounds from a sinus perspective she also does have the results of the culture today which I went over with her and showed Pseudomonas that the predominant organism. Subsequently we will get a go ahead and address this as rapidly as possible I am going to go ahead and get her started on Levaquin and I want to get that started today. 09-29-2023 upon evaluation today patient appears to be doing well currently in regard to the wounds on her legs the right leg looks like it may be just about healed the left leg is significantly improved. Actually I am very pleased with where we stand today and I think that she is making good headway here towards closure which is great news. SHEA, MCBETH ([id number]) [id number].pdf Page 2 of 7 Electronic Signature(s) Signed: 09/29/2023 5:27:26 PM By: Allen Derry PA-C Entered By: Allen Derry on 09/29/2023 14:27:26 -------------------------------------------------------------------------------- Physical Exam Details Patient Name: Date of Service: Elizabeth [name], Elizabeth [name]. 09/29/2023 2:00 PM Medical Record Number: [id number] Patient Account Number: [id number] Date of Birth/Sex: Treating RN: 22-Sep-1951 (72 y.o. Elizabeth [name] Primary Care Provider: Darreld Mclean Other Clinician: Referring Provider: Treating Provider/Extender: Salvatore Decent in Treatment: 2 Constitutional Obese and well-hydrated in no acute distress. Respiratory normal breathing without difficulty. Psychiatric this patient is able to make decisions and demonstrates good insight into disease process. Alert and Oriented x 3. pleasant and cooperative. Notes Upon inspection patient's wound bed actually showed signs of good granulation epithelization at this  point. Fortunately I do not see any evidence of worsening and I believe that we will make an pretty good headway here towards closure which is great news. Electronic Signature(s)  Signed: 09/29/2023 5:29:22 PM By: Allen Derry PA-C Entered By: Allen Derry on 09/29/2023 14:29:22 -------------------------------------------------------------------------------- Physician Orders Details Patient Name: Date of Service: Elizabeth [name], Elizabeth [name]. 09/29/2023 2:00 PM Medical Record Number: [id number] Patient Account Number: [id number] Date of Birth/Sex: Treating RN: 1951-09-16 (72 y.o. Elizabeth [name] Primary Care Provider: Darreld Mclean Other Clinician: Referring Provider: Treating Provider/Extender: Salvatore Decent in Treatment: 2 The following information was scribed by: Angelina Pih The information was scribed for: Allen Derry Verbal / Phone Orders: No Diagnosis Coding ICD-10 Coding Elizabeth [name], Elizabeth [name] ([id number]) [phone].pdf Page 3 of 7 Code Description I89.0 Lymphedema, not elsewhere classified E11.622 Type 2 diabetes mellitus with other skin ulcer L97.822 Non-pressure chronic ulcer of other part of left lower leg with fat layer exposed L97.512 Non-pressure chronic ulcer of other part of right foot with 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 Nurse Visit as needed - PRN - to re-wrap likely Monday Bathing/ Shower/ Hygiene May shower with wound dressing protected with water repellent cover or cast protector. No tub bath. Anesthetic (Use 'Patient Medications' Section for Anesthetic Order Entry) Lidocaine applied to wound bed Edema Control - Orders / Instructions Elevate, Exercise Daily and A void Standing for Long  Periods of Time. Elevate leg(s) parallel to the floor when sitting. DO YOUR BEST to sleep in the bed at night. DO NOT sleep in your recliner. Long hours of sitting in a recliner leads to swelling of the legs and/or potential wounds on your backside. Wound Treatment Wound #6 - Lower Leg Wound Laterality: Right, Medial Cleanser: Soap and Water 2 x Per Week/15 Days Discharge Instructions: Gently cleanse wound with antibacterial soap, rinse and pat dry prior to dressing wounds Cleanser: Wound Cleanser 2 x Per Week/15 Days Discharge Instructions: Wash your hands with soap and water. Remove old dressing, discard into plastic bag and place into trash. Cleanse the wound with Wound Cleanser prior to applying a clean dressing using gauze sponges, not tissues or cotton balls. Do not scrub or use excessive force. Pat dry using gauze sponges, not tissue or cotton balls. Prim Dressing: Curity Oil Emulsion Dressing, 5x9 (in/in) ary 2 x Per Week/15 Days Secondary Dressing: ABD Pad 5x9 (in/in) 2 x Per Week/15 Days Discharge Instructions: Cover with ABD pad Compression Wrap: Urgo K2 Lite, two layer compression system, large 2 x Per Week/15 Days Wound #7 - Lower Leg Wound Laterality: Left Cleanser: Soap and Water 2 x Per Week/15 Days Discharge Instructions: Gently cleanse wound with antibacterial soap, rinse and pat dry prior to dressing wounds Cleanser: Wound Cleanser 2 x Per Week/15 Days Discharge Instructions: Wash your hands with soap and water. Remove old dressing, discard into plastic bag and place into trash. Cleanse the wound with Wound Cleanser prior to applying a clean dressing using gauze sponges, not tissues or cotton balls. Do not scrub or use excessive force. Pat dry using gauze sponges, not tissue or cotton balls. Prim Dressing: Curity Oil Emulsion Dressing, 5x9 (in/in) ary 2 x Per Week/15 Days Secondary Dressing: Zetuvit Plus 4x8 (in/in) 2 x Per Week/15 Days Compression Wrap: Urgo K2 Lite,  two layer compression system, large 2 x Per Week/15 Days Electronic Signature(s) Signed: 09/29/2023 4:35:52 PM By: Angelina Pih Signed: 09/29/2023 6:01:43 PM By: Allen Derry PA-C Entered By: Angelina Pih on 09/29/2023 11:58:36 Elizabeth [name],  Elizabeth [name] ([id number]) [id number].pdf Page 4 of 7 -------------------------------------------------------------------------------- Problem List Details Patient Name: Date of Service: Elizabeth [name], Elizabeth [name]. 09/29/2023 2:00 PM Medical Record Number: [id number] Patient Account Number: [id number] Date of Birth/Sex: Treating RN: 03/11/1951 (72 y.o. 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 09/15/2023 No Yes N18.32 Chronic kidney disease, stage 3b 09/15/2023 No Yes I48.0 Paroxysmal atrial fibrillation 09/15/2023 No Yes Z79.01 Long term (current) use of anticoagulants 09/15/2023 No Yes Inactive Problems Resolved Problems Electronic Signature(s) Signed: 09/29/2023 2:14:00 PM By: Allen Derry PA-C Entered By: Allen Derry on 09/29/2023 11:14:00 Elizabeth [name] ([id number]) [id number].pdf Page 5 of 7 -------------------------------------------------------------------------------- Progress Note Details Patient Name: Date of Service: Elizabeth [name], Elizabeth [name]. 09/29/2023 2:00 PM Medical Record Number:  [id number] Patient Account Number: [id number] Date of Birth/Sex: Treating RN: Mar 14, 1951 (72 y.o. Elizabeth [name], Luther Parody Primary Care Provider: Darreld Mclean Other Clinician: Referring Provider: Treating Provider/Extender: Salvatore Decent in Treatment: 2 Subjective Chief Complaint Information obtained from Patient Bilateral LE Ulcers History of Present Illness (HPI) Readmission: 09-15-2023 upon evaluation today patient presents for readmission here in the clinic although she was seen just recently although it was for an area on her breast. Subsequently at this point patient's wound actually is on her legs she has bilateral lower extremity lymphedema and she has bilateral lower extremity swelling. She seems to have good arterial flow visually although we could not perform ABIs today due to the location of the wounds. Patient states this started about 2 weeks prior as far as the weeping is concerned she does not wear compression socks as they are too tight to get on she tells me. Patient does have a history of diabetes mellitus type 2, diabetic neuropathy, chronic kidney disease stage IIIb, atrial fibrillation and she is on Eliquis for anticoagulation. Her most recent hemoglobin A1c was 6.5. 09-22-2023 upon evaluation patient appears to be doing better in regard to both wounds from a sinus perspective she also does have the results of the culture today which I went over with her and showed Pseudomonas that the predominant organism. Subsequently we will get a go ahead and address this as rapidly as possible I am going to go ahead and get her started on Levaquin and I want to get that started today. 09-29-2023 upon evaluation today patient appears to be doing well currently in regard to the wounds on her legs the right leg looks like it may be just about healed the left leg is significantly improved. Actually I am very pleased with where we stand today and I think that she is making good  headway here towards closure which is great news. Objective Constitutional Obese and well-hydrated in no acute distress. Vitals Time Taken: 1:59 PM, Height: 63 in, Weight: 197 lbs, BMI: 34.9, Temperature: 97.5 F, Pulse: 65 bpm, Respiratory Rate: 18 breaths/min, Blood Pressure: 102/61 mmHg. Respiratory normal breathing without difficulty. Psychiatric this patient is able to make decisions and demonstrates good  insight into disease process. Alert and Oriented x 3. pleasant and cooperative. General Notes: Upon inspection patient's wound bed actually showed signs of good granulation epithelization at this point. Fortunately I do not see any evidence of worsening and I believe that we will make an pretty good headway here towards closure which is great news. Integumentary (Hair, Skin) Wound #6 status is Open. Original cause of wound was Blister. The date acquired was: 08/29/2023. The wound has been in treatment 2 weeks. The wound is located on the Right,Medial Lower Leg. The wound measures 0.5cm length x 0.5cm width x 0.1cm depth; 0.196cm^2 area and 0.02cm^3 volume. There is no tunneling or undermining noted. There is a large amount of serosanguineous drainage noted. There is medium (34-66%) red, pink granulation within the wound bed. There is no necrotic tissue within the wound bed. Wound #7 status is Open. Original cause of wound was Blister. The date acquired was: 08/29/2023. The wound has been in treatment 2 weeks. The wound is located on the Left Lower Leg. The wound measures 10cm length x 9cm width x 0.1cm depth; 70.686cm^2 area and 7.069cm^3 volume. There is no tunneling or undermining noted. There is a large amount of serosanguineous drainage noted. There is large (67-100%) red, pink, friable granulation within the wound bed. There is a small (1-33%) amount of necrotic tissue within the wound bed including Adherent Slough. 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 There was a Double Layer Compression Therapy Procedure by Angelina Pih, RN. Post procedure Diagnosis Wound #6: Same as Pre-Procedure Wound #7 Pre-procedure diagnosis of Wound #7 is a Lymphedema located on the Left Lower Leg . There was a Double Layer Compression Therapy Procedure by Angelina Pih, RN. Post procedure Diagnosis Wound #7: Same as Pre-Procedure Plan Follow-up Appointments: Return Appointment in 1 week. Nurse Visit as needed - PRN - to re-wrap likely Monday Bathing/ Shower/ Hygiene: May shower with wound dressing protected with water repellent cover or cast protector. No tub bath. Anesthetic (Use 'Patient Medications' Section for Anesthetic Order Entry): Lidocaine applied to wound bed Edema Control - Orders / Instructions: Elevate, Exercise Daily and Avoid Standing for Long Periods of Time. Elevate leg(s) parallel to the floor when sitting. DO YOUR BEST to sleep in the bed at night. DO NOT sleep in your recliner. Long hours of sitting in a recliner leads to swelling of the legs and/or potential wounds on your backside. WOUND #6: - Lower Leg Wound Laterality: Right, Medial Cleanser: Soap and Water 2 x Per Week/15 Days Discharge Instructions: Gently cleanse wound with antibacterial soap, rinse and pat dry prior to dressing wounds Cleanser: Wound Cleanser 2 x Per Week/15 Days Discharge Instructions:  Wash your hands with soap and water. Remove old dressing, discard into plastic bag and place into trash. Cleanse the wound with Wound Cleanser prior to applying a clean dressing using gauze sponges, not tissues or cotton balls. Do not scrub or use excessive force. Pat dry using gauze sponges, not tissue or cotton balls. Prim  Dressing: Curity Oil Emulsion Dressing, 5x9 (in/in) 2 x Per Week/15 Days ary Secondary Dressing: ABD Pad 5x9 (in/in) 2 x Per Week/15 Days Discharge Instructions: Cover with ABD pad Com pression Wrap: Urgo K2 Lite, two layer compression system, large 2 x Per Week/15 Days WOUND #7: - Lower Leg Wound Laterality: Left Cleanser: Soap and Water 2 x Per Week/15 Days Discharge Instructions: Gently cleanse wound with antibacterial soap, rinse and pat dry prior to dressing wounds Cleanser: Wound Cleanser 2 x Per Week/15 Days Discharge Instructions: Wash your hands with soap and water. Remove old dressing, discard into plastic bag and place into trash. Cleanse the wound with Wound Cleanser prior to applying a clean dressing using gauze sponges, not tissues or cotton balls. Do not scrub or use excessive force. Pat dry using gauze sponges, not tissue or cotton balls. Prim Dressing: Curity Oil Emulsion Dressing, 5x9 (in/in) 2 x Per Week/15 Days ary Secondary Dressing: Zetuvit Plus 4x8 (in/in) 2 x Per Week/15 Days Com pression Wrap: Urgo K2 Lite, two layer compression system, large 2 x Per Week/15 Days 1. Based on what I am seeing I am going to recommend that the patient should continue to monitor for any signs of infection or worsening. I do believe that were making good headway here towards closure. 2. I am going to recommend as well that the patient should continue to elevate her legs much as possible the more of this she can do the better. 3. I am also can recommend that she should continue to follow-up regularly here in the clinic we want to keep a close eye on things to make sure  that she is doing well. With that being said she is attempting as much as possible to try to manage this at home and I think she is doing a pretty good job. She just needs to continue along with the plan. We will see patient back for reevaluation in 1 week here in the clinic. If anything worsens or changes patient will contact our office for additional recommendations. Elizabeth [name], Elizabeth [name] ([id number]) [id number].pdf Page 7 of 7 Electronic Signature(s) Signed: 09/29/2023 5:30:04 PM By: Allen Derry PA-C Entered By: Allen Derry on 09/29/2023 14:30:04 -------------------------------------------------------------------------------- SuperBill Details Patient Name: Date of Service: Elizabeth [name], Elizabeth [name]. 09/29/2023 Medical Record Number: [id number] Patient Account Number: [id number] Date of Birth/Sex: Treating RN: [birth year] (72 y.o. 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 Modifier: Quantity: 1 Physician Procedures : CPT4 Code Description Modifier [id number] - WC PHYS LEVEL 3 - EST PT ICD-10 Diagnosis Description I89.0 Lymphedema, not elsewhere classified  E11.622 Type 2 diabetes mellitus with other skin ulcer L97.822 Non-pressure chronic ulcer of other part of  left lower leg with fat layer exposed L97.512 Non-pressure chronic ulcer of other part of right foot with fat layer exposed Quantity: 1 Electronic  Signature(s) Signed: 09/29/2023 5:31:24 PM By: Allen Derry PA-C Previous Signature: 09/29/2023 3:25:09 PM Version By: Angelina Pih Entered By: Allen Derry on 09/29/2023 14:31:24

## 2023-09-29 NOTE — Progress Notes (Signed)
SOPHIA, [facility] ([id number]) [id number] Page 1 of 11 Visit Report for 09/29/2023 Arrival Information Details Patient Name: Date of Service: Elizabeth [name], [place] [name] 09/29/2023 2:00 PM Medical Record Number: [id number] Patient Account Number: [id number] Date of Birth/Sex: Treating RN: [birth year] (72 y.o. [name] Primary Care [place] Sites: Darreld Mclean Other Clinician: Referring Laurens Matheny: Treating Namir Neto/Extender: Salvatore Decent in Treatment: 2 Visit Information History Since Last Visit Added or deleted any medications: Yes Patient Arrived: Wheel Chair Any new allergies or adverse reactions: No Arrival Time: 13:58 Had a fall or experienced change in Yes Accompanied By: family in [place] activities of daily living that may affect Transfer Assistance: None risk of falls: Patient Identification Verified: Yes Hospitalized since last visit: No Secondary Verification Process Completed: Yes Has Dressing in Place as Prescribed: Yes Patient Requires Transmission-Based Precautions: No Has Compression in Place as Prescribed: Yes Patient Has Alerts: No Pain Present Now: No Notes pt states fall yesterday and today, pt states legs weak and wobbly, states started a new medication Electronic Signature(s) Signed: 09/29/2023 4:35:52 PM By: Angelina Pih Entered By: Angelina Pih on 09/29/2023 13:59:34 -------------------------------------------------------------------------------- Clinic Level of Care Assessment Details Patient Name: Date of Service: Elizabeth [name], [place] [name] 09/29/2023 2:00 PM Medical Record Number: [id number] Patient Account Number: [id number] Date of Birth/Sex: Treating RN: [birth year] (72 y.o. [name] Primary Care Makoto Sellitto: Darreld Mclean Other Clinician: Referring Sinjin Amero: Treating Lynsay Fesperman/Extender: Salvatore Decent in Treatment: 2 Clinic Level of Care Assessment Items TOOL 1 Quantity Score []  -  0 Use when EandM and Procedure is performed on INITIAL visit ASSESSMENTS - Nursing Assessment / Reassessment []  - 0 General Physical Exam (combine w/ comprehensive assessment (listed just below) when performed on new pt. evals) []  - 0 Comprehensive Assessment (HX, ROS, Risk Assessments, Wounds Hx, etc.) KAMILLY, HEISERMAN L ([id number]) [id number] Page 2 of 11 ASSESSMENTS - Wound and Skin Assessment / Reassessment []  - 0 Dermatologic / Skin Assessment (not related to wound area) ASSESSMENTS - Ostomy and/or Continence Assessment and Care []  - 0 Incontinence Assessment and Management []  - 0 Ostomy Care Assessment and Management (repouching, etc.) PROCESS - Coordination of Care []  - 0 Simple Patient / Family Education for ongoing care []  - 0 Complex (extensive) Patient / Family Education for ongoing care []  - 0 Staff obtains Chiropractor, Records, T Results / Process Orders est []  - 0 Staff telephones HHA, Nursing Homes / Clarify orders / etc []  - 0 Routine Transfer to another Facility (non-emergent condition) []  - 0 Routine Hospital Admission (non-emergent condition) []  - 0 New Admissions / Manufacturing engineer / Ordering NPWT Apligraf, etc. , []  - 0 Emergency Hospital Admission (emergent condition) PROCESS - Special Needs []  - 0 Pediatric / Minor Patient Management []  - 0 Isolation Patient Management []  - 0 Hearing / Language / Visual special needs []  - 0 Assessment of Community assistance (transportation, D/C planning, etc.) []  - 0 Additional assistance / Altered mentation []  - 0 Support Surface(s) Assessment (bed, cushion, seat, etc.) INTERVENTIONS - Miscellaneous []  - 0 External ear exam []  - 0 Patient Transfer (multiple staff / Nurse, adult / Similar devices) []  - 0 Simple Staple / Suture removal (25 or less) []  - 0 Complex Staple / Suture removal (26 or more) []  - 0 Hypo/Hyperglycemic Management (do not check if billed separately) []   - 0 Ankle / Brachial Index (ABI) - do not check if billed separately Has the patient been seen at the  hospital within the last three years: Yes Total Score: 0 Level Of Care: ____ Electronic Signature(s) Signed: 09/29/2023 4:35:52 PM By: Angelina Pih Entered By: Angelina Pih on 09/29/2023 15:24:58 -------------------------------------------------------------------------------- Compression Therapy Details Patient Name: Date of Service: Elizabeth [name], [place] [name] 09/29/2023 2:00 PM Medical Record Number: [id number] Patient Account Number: [id number] Date of Birth/Sex: Treating RN: 05-26-1951 (72 y.o. [name] Primary Care Kendrick Haapala: Darreld Mclean Other Clinician: Referring Joyell Emami: Treating Napoleon Monacelli/Extender: Salvatore Decent in Treatment: 2 BEVERLI, SUNDBERG ([id number]) [id number] Page 3 of 11 Compression Therapy Performed for Wound Assessment: Wound #6 Right,Medial Lower Leg Performed By: Clinician Angelina Pih, RN Compression Type: Double Layer Post Procedure Diagnosis Same as Pre-procedure Electronic Signature(s) Signed: 09/29/2023 4:35:52 PM By: Angelina Pih Entered By: Angelina Pih on 09/29/2023 14:57:53 -------------------------------------------------------------------------------- Compression Therapy Details Patient Name: Date of Service: Elizabeth [name], [place] [name] 09/29/2023 2:00 PM Medical Record Number: [id number] Patient Account Number: [id number] Date of Birth/Sex: Treating RN: April 14, 1951 (72 y.o. [name] Primary Care Bradford Cazier: Darreld Mclean Other Clinician: Referring Montee Tallman: Treating Jonathandavid Marlett/Extender: Salvatore Decent in Treatment: 2 Compression Therapy Performed for Wound Assessment: Wound #7 Left Lower Leg Performed By: Clinician Angelina Pih, RN Compression Type: Double Layer Post Procedure Diagnosis Same as Pre-procedure Electronic Signature(s) Signed: 09/29/2023 4:35:52  PM By: Angelina Pih Entered By: Angelina Pih on 09/29/2023 14:57:53 -------------------------------------------------------------------------------- Encounter Discharge Information Details Patient Name: Date of Service: Elizabeth [name], [place] [name] 09/29/2023 2:00 PM Medical Record Number: [id number] Patient Account Number: [id number] Date of Birth/Sex: Treating RN: 1951/05/06 (72 y.o. [name] Primary Care Sybilla Malhotra: Darreld Mclean Other Clinician: Referring Toshio Slusher: Treating Jandy Brackens/Extender: Salvatore Decent in Treatment: 2 Encounter Discharge Information Items Discharge Condition: Stable Ambulatory Status: Wheelchair Discharge Destination: Home Transportation: Private Auto Accompanied By: husband in [place], [place] ([id number]) [id number] Page 4 of 11 Schedule Follow-up Appointment: Yes Clinical Summary of Care: Notes PA Stone aware of recent falls, advised patient to get in contact with MD who prescribed new med today and discuss issues she is having. Patient stated understanding. Electronic Signature(s) Signed: 09/29/2023 3:26:44 PM By: Angelina Pih Entered By: Angelina Pih on 09/29/2023 15:26:44 -------------------------------------------------------------------------------- Lower Extremity Assessment Details Patient Name: Date of Service: Elizabeth [name], [place] [name] 09/29/2023 2:00 PM Medical Record Number: [id number] Patient Account Number: [id number] Date of Birth/Sex: Treating RN: [birth year] (72 y.o. [name] Primary Care Curby Carswell: Darreld Mclean Other Clinician: Referring Senaida Chilcote: Treating Valkyrie Guardiola/Extender: Salvatore Decent in Treatment: 2 Edema Assessment Assessed: [Left: No] [Right: No] Edema: [Left: Yes] [Right: Yes] Calf Left: Right: Point of Measurement: 32 cm From Medial Instep 38.5 cm 40 cm Ankle Left: Right: Point of Measurement: 10 cm From Medial Instep 26 cm 24.2  cm Vascular Assessment Pulses: Dorsalis Pedis Palpable: [Left:Yes] [Right:Yes] Extremity colors, hair growth, and conditions: Extremity Color: [Left:Red] [Right:Red] Hair Growth on Extremity: [Left:Yes] [Right:Yes] Temperature of Extremity: [Left:Warm < 3 seconds] [Right:Warm < 3 seconds] Toe Nail Assessment Left: Right: Thick: Yes Yes Discolored: Yes Yes Deformed: No No Improper Length and Hygiene: Yes Yes Electronic Signature(s) Signed: 09/29/2023 4:35:52 PM By: Angelina Pih Entered By: Angelina Pih on 09/29/2023 14:13:37 [name] ([id number]) [id number] Page 5 of 11 -------------------------------------------------------------------------------- Multi Wound Chart Details Patient Name: Date of Service: Elizabeth [name], [place] [name] 09/29/2023 2:00 PM Medical Record Number: [id number] Patient Account Number: [id number] Date of Birth/Sex: Treating RN: 06/07/1951 (72 y.o. Philbert Riser,  Caitlin Primary Care Sariyah Corcino: Darreld Mclean Other Clinician: Referring Rayley Gao: Treating Reghan Thul/Extender: Salvatore Decent in Treatment: 2 Vital Signs Height(in): 63 Pulse(bpm): 65 Weight(lbs): 197 Blood Pressure(mmHg): 102/61 Body Mass Index(BMI): 34.9 Temperature(F): 97.5 Respiratory Rate(breaths/min): 18 [6:Photos:] [N/A:N/A] Right, Medial Lower Leg Left Lower Leg N/A Wound Location: Blister Blister N/A Wounding Event: Lymphedema Lymphedema N/A Primary Etiology: Lymphedema, Asthma, Arrhythmia, Lymphedema, Asthma, Arrhythmia, N/A Comorbid History: Hypertension, Peripheral Venous Hypertension, Peripheral Venous Disease, Type II Diabetes, History of Disease, Type II Diabetes, History of pressure wounds, Osteomyelitis, pressure wounds, Osteomyelitis, Neuropathy, Seizure Disorder, Neuropathy, Seizure Disorder, Received Radiation Received Radiation 08/29/2023 08/29/2023 N/A Date Acquired: 2 2 N/A Weeks of Treatment: Open Open N/A Wound  Status: No No N/A Wound Recurrence: 0.5x0.5x0.1 10x9x0.1 N/A Measurements L x W x D (cm) 0.196 70.686 N/A A (cm) : rea 0.02 7.069 N/A Volume (cm) : 99.80% 49.60% N/A % Reduction in Area: 99.80% 49.60% N/A % Reduction in Volume: Full Thickness Without Exposed Full Thickness Without Exposed N/A Classification: Support Structures Support Structures Large Large N/A Exudate A mount: Serosanguineous Serosanguineous N/A Exudate Type: red, brown red, brown N/A Exudate Color: Medium (34-66%) Large (67-100%) N/A Granulation A mount: Red, Pink Red, Pink, Friable N/A Granulation Quality: None Present (0%) Small (1-33%) N/A Necrotic A mount: Fat Layer (Subcutaneous Tissue): No Fat Layer (Subcutaneous Tissue): No N/A Exposed Structures: Medium (34-66%) Small (1-33%) N/A Epithelialization: Treatment Notes Electronic Signature(s) Signed: 09/29/2023 4:35:52 PM By: Angelina Pih Entered By: Angelina Pih on 09/29/2023 14:57:36 [name] ([id number]) [id number] Page 6 of 11 -------------------------------------------------------------------------------- Multi-Disciplinary Care Plan Details Patient Name: Date of Service: Elizabeth [name], [place] [name] 09/29/2023 2:00 PM Medical Record Number: [id number] Patient Account Number: [id number] Date of Birth/Sex: Treating RN: 10/17/1951 (72 y.o. [name] Primary Care Camrin Gearheart: Darreld Mclean Other Clinician: Referring Baptiste Littler: Treating Jami Bogdanski/Extender: Salvatore Decent in Treatment: 2 Active Inactive Necrotic Tissue Nursing Diagnoses: Impaired tissue integrity related to necrotic/devitalized tissue Knowledge deficit related to management of necrotic/devitalized tissue Goals: Necrotic/devitalized tissue will be minimized in the wound bed Date Initiated: 09/15/2023 Target Resolution Date: 11/09/2023 Goal Status: Active Patient/caregiver will verbalize understanding of reason and  process for debridement of necrotic tissue Date Initiated: 09/15/2023 Date Inactivated: 09/15/2023 Target Resolution Date: 09/14/2023 Goal Status: Met Interventions: Assess patient pain level pre-, during and post procedure and prior to discharge Provide education on necrotic tissue and debridement process Treatment Activities: Apply topical anesthetic as ordered : 09/15/2023 Excisional debridement : 09/15/2023 Notes: Wound/Skin Impairment Nursing Diagnoses: Impaired tissue integrity Knowledge deficit related to ulceration/compromised skin integrity Goals: Ulcer/skin breakdown will have a volume reduction of 30% by week 4 Date Initiated: 09/15/2023 Target Resolution Date: 10/12/2023 Goal Status: Active Ulcer/skin breakdown will have a volume reduction of 50% by week 8 Date Initiated: 09/15/2023 Target Resolution Date: 11/09/2023 Goal Status: Active Ulcer/skin breakdown will have a volume reduction of 80% by week 12 Date Initiated: 09/15/2023 Target Resolution Date: 12/07/2023 Goal Status: Active Ulcer/skin breakdown will heal within 14 weeks Date Initiated: 09/15/2023 Target Resolution Date: 12/21/2023 Goal Status: Active Interventions: Assess patient/caregiver ability to obtain necessary supplies Assess patient/caregiver ability to perform ulcer/skin care regimen upon admission and as needed Assess ulceration(s) every visit Provide education on smoking Provide education on ulcer and skin care PREEYA, KOELBL ([id number]) [id number] Page 7 of 11 Notes: Electronic Signature(s) Signed: 09/29/2023 3:25:17 PM By: Angelina Pih Entered By: Angelina Pih on 09/29/2023 15:25:17 -------------------------------------------------------------------------------- Pain Assessment Details Patient Name: Date of Service: Einar Crow  [place], [place] [name] 09/29/2023 2:00 PM Medical Record Number: [id number] Patient Account Number: [id number] Date of Birth/Sex: Treating  RN: [birth year] (72 y.o. [name] Primary Care Starleen Trussell: Darreld Mclean Other Clinician: Referring Kahlee Metivier: Treating Bart Ashford/Extender: Salvatore Decent in Treatment: 2 Active Problems Location of Pain Severity and Description of Pain Patient Has Paino No Site Locations Rate the pain. Current Pain Level: 0 Pain Management and Medication Current Pain Management: Electronic Signature(s) Signed: 09/29/2023 4:35:52 PM By: Angelina Pih Entered By: Angelina Pih on 09/29/2023 14:01:05 [name] ([id number]) [id number] Page 8 of 11 -------------------------------------------------------------------------------- Patient/Caregiver Education Details Patient Name: Date of Service: Elizabeth [name], [place] [name] 11/21/2024andnbsp2:00 PM Medical Record Number: [id number] Patient Account Number: [id number] Date of Birth/Gender: Treating RN: 09-28-1951 (72 y.o. [name] Primary Care Physician: Darreld Mclean Other Clinician: Referring Physician: Treating Physician/Extender: Salvatore Decent in Treatment: 2 Education Assessment Education Provided To: Patient Education Topics Provided Wound/Skin Impairment: Handouts: Caring for Your Ulcer Methods: Explain/Verbal Responses: State content correctly Electronic Signature(s) Signed: 09/29/2023 4:35:52 PM By: Angelina Pih Entered By: Angelina Pih on 09/29/2023 15:25:26 -------------------------------------------------------------------------------- Wound Assessment Details Patient Name: Date of Service: Elizabeth [name], [place] [name] 09/29/2023 2:00 PM Medical Record Number: [id number] Patient Account Number: [id number] Date of Birth/Sex: Treating RN: 1951/10/29 (72 y.o. [name] Primary Care Aleisha Paone: Darreld Mclean Other Clinician: Referring Elianah Karis: Treating Berklee Battey/Extender: Salvatore Decent in Treatment: 2 Wound Status Wound Number: 6  Primary Lymphedema Etiology: Wound Location: Right, Medial Lower Leg Wound Open Wounding Event: Blister Status: Date Acquired: 08/29/2023 Comorbid Lymphedema, Asthma, Arrhythmia, Hypertension, Peripheral Weeks Of Treatment: 2 History: Venous Disease, Type II Diabetes, History of pressure wounds, Clustered Wound: No Osteomyelitis, Neuropathy, Seizure Disorder, Received Radiation Photos Wound Measurements Length: (cm) 0.5 Monrreal, Charlye L ([id number]) Width: (cm) 0.5 Depth: (cm) 0.1 Area: (cm) 0.196 Volume: (cm) 0.02 % Reduction in Area: 99.8% [id number] Page 9 of 11 % Reduction in Volume: 99.8% Epithelialization: Medium (34-66%) Tunneling: No Undermining: No Wound Description Classification: Full Thickness Without Exposed Support Structures Exudate Amount: Large Exudate Type: Serosanguineous Exudate Color: red, brown Foul Odor After Cleansing: No Slough/Fibrino Yes Wound Bed Granulation Amount: Medium (34-66%) Exposed Structure Granulation Quality: Red, Pink Fat Layer (Subcutaneous Tissue) Exposed: No Necrotic Amount: None Present (0%) Treatment Notes Wound #6 (Lower Leg) Wound Laterality: Right, Medial Cleanser Soap and Water Discharge Instruction: Gently cleanse wound with antibacterial soap, rinse and pat dry prior to dressing wounds Wound Cleanser Discharge Instruction: Wash your hands with soap and water. Remove old dressing, discard into plastic bag and place into trash. Cleanse the wound with Wound Cleanser prior to applying a clean dressing using gauze sponges, not tissues or cotton balls. Do not scrub or use excessive force. Pat dry using gauze sponges, not tissue or cotton balls. Peri-Wound Care Topical Primary Dressing Curity Oil Emulsion Dressing, 5x9 (in/in) Secondary Dressing ABD Pad 5x9 (in/in) Discharge Instruction: Cover with ABD pad Secured With Compression Wrap Urgo K2 Lite, two layer compression system,  large Compression Stockings Add-Ons Electronic Signature(s) Signed: 09/29/2023 4:35:52 PM By: Angelina Pih Entered By: Angelina Pih on 09/29/2023 14:12:18 -------------------------------------------------------------------------------- Wound Assessment Details Patient Name: Date of Service: Elizabeth [name], [place] [name] 09/29/2023 2:00 PM Medical Record Number: [id number] Patient Account Number: [id number] Date of Birth/Sex: Treating RN: [birth year] (72 y.o. [name] Primary Care Zo Loudon: Darreld Mclean Other Clinician: Referring Belenda Alviar: Treating Rashi Giuliani/Extender: Salvatore Decent in Treatment: 2  Wound Status DONSHA, MEHOK L ([id number]) [id number] Page 10 of 11 Wound Number: 7 Primary Lymphedema Etiology: Wound Location: Left Lower Leg Wound Open Wounding Event: Blister Status: Date Acquired: 08/29/2023 Comorbid Lymphedema, Asthma, Arrhythmia, Hypertension, Peripheral Weeks Of Treatment: 2 History: Venous Disease, Type II Diabetes, History of pressure wounds, Clustered Wound: No Osteomyelitis, Neuropathy, Seizure Disorder, Received Radiation Photos Wound Measurements Length: (cm) 10 Width: (cm) 9 Depth: (cm) 0.1 Area: (cm) 70.686 Volume: (cm) 7.069 % Reduction in Area: 49.6% % Reduction in Volume: 49.6% Epithelialization: Small (1-33%) Tunneling: No Undermining: No Wound Description Classification: Full Thickness Without Exposed Support Structures Exudate Amount: Large Exudate Type: Serosanguineous Exudate Color: red, brown Foul Odor After Cleansing: No Slough/Fibrino Yes Wound Bed Granulation Amount: Large (67-100%) Exposed Structure Granulation Quality: Red, Pink, Friable Fat Layer (Subcutaneous Tissue) Exposed: No Necrotic Amount: Small (1-33%) Necrotic Quality: Adherent Slough Treatment Notes Wound #7 (Lower Leg) Wound Laterality: Left Cleanser Soap and Water Discharge Instruction: Gently cleanse wound  with antibacterial soap, rinse and pat dry prior to dressing wounds Wound Cleanser Discharge Instruction: Wash your hands with soap and water. Remove old dressing, discard into plastic bag and place into trash. Cleanse the wound with Wound Cleanser prior to applying a clean dressing using gauze sponges, not tissues or cotton balls. Do not scrub or use excessive force. Pat dry using gauze sponges, not tissue or cotton balls. Peri-Wound Care Topical Primary Dressing Curity Oil Emulsion Dressing, 5x9 (in/in) Secondary Dressing Zetuvit Plus 4x8 (in/in) Secured With Compression Wrap Urgo K2 Lite, two layer compression system, large Compression Stockings Add-Ons Electronic Signature(s) Signed: 09/29/2023 4:35:52 PM By: Angelina Pih Entered By: Angelina Pih on 09/29/2023 14:12:45 [name] ([id number]) [id number] Page 11 of 11 -------------------------------------------------------------------------------- Vitals Details Patient Name: Date of Service: Elizabeth [name], [place] [name] 09/29/2023 2:00 PM Medical Record Number: [id number] Patient Account Number: [id number] Date of Birth/Sex: Treating RN: [birth year] (72 y.o. [name] Primary Care Belem Hintze: Darreld Mclean Other Clinician: Referring Mackayla Mullins: Treating Elain Wixon/Extender: Salvatore Decent in Treatment: 2 Vital Signs Time Taken: 13:59 Temperature (F): 97.5 Height (in): 63 Pulse (bpm): 65 Weight (lbs): 197 Respiratory Rate (breaths/min): 18 Body Mass Index (BMI): 34.9 Blood Pressure (mmHg): 102/61 Reference Range: 80 - 120 mg / dl Electronic Signature(s) Signed: 09/29/2023 4:35:52 PM By: Angelina Pih Entered By: Angelina Pih on 09/29/2023 14:00:57

## 2023-09-30 ENCOUNTER — Telehealth: Payer: Self-pay | Admitting: Cardiology

## 2023-10-02 ENCOUNTER — Other Ambulatory Visit: Payer: Self-pay

## 2023-10-02 ENCOUNTER — Emergency Department: Payer: Medicare PPO

## 2023-10-02 ENCOUNTER — Inpatient Hospital Stay
Admission: EM | Admit: 2023-10-02 | Discharge: 2023-10-10 | DRG: 101 | Disposition: A | Discharge status: Skilled Nursing Facility | Payer: Medicare PPO | Attending: Internal Medicine | Admitting: Internal Medicine

## 2023-10-02 DIAGNOSIS — Y92009 Unspecified place in unspecified non-institutional (private) residence as the place of occurrence of the external cause: Secondary | ICD-10-CM

## 2023-10-02 DIAGNOSIS — I890 Lymphedema, not elsewhere classified: Secondary | ICD-10-CM | POA: Diagnosis present

## 2023-10-02 DIAGNOSIS — Z853 Personal history of malignant neoplasm of breast: Secondary | ICD-10-CM

## 2023-10-02 DIAGNOSIS — G40909 Epilepsy, unspecified, not intractable, without status epilepticus: Secondary | ICD-10-CM | POA: Diagnosis not present

## 2023-10-02 DIAGNOSIS — T368X5A Adverse effect of other systemic antibiotics, initial encounter: Secondary | ICD-10-CM | POA: Diagnosis present

## 2023-10-02 DIAGNOSIS — R569 Unspecified convulsions: Secondary | ICD-10-CM | POA: Diagnosis not present

## 2023-10-02 DIAGNOSIS — H5462 Unqualified visual loss, left eye, normal vision right eye: Secondary | ICD-10-CM | POA: Diagnosis present

## 2023-10-02 DIAGNOSIS — N289 Disorder of kidney and ureter, unspecified: Principal | ICD-10-CM

## 2023-10-02 DIAGNOSIS — I491 Atrial premature depolarization: Secondary | ICD-10-CM | POA: Diagnosis present

## 2023-10-02 DIAGNOSIS — I69344 Monoplegia of lower limb following cerebral infarction affecting left non-dominant side: Secondary | ICD-10-CM

## 2023-10-02 DIAGNOSIS — I4819 Other persistent atrial fibrillation: Secondary | ICD-10-CM | POA: Diagnosis present

## 2023-10-02 DIAGNOSIS — I83009 Varicose veins of unspecified lower extremity with ulcer of unspecified site: Secondary | ICD-10-CM | POA: Diagnosis present

## 2023-10-02 DIAGNOSIS — E854 Organ-limited amyloidosis: Secondary | ICD-10-CM | POA: Diagnosis present

## 2023-10-02 DIAGNOSIS — Z7984 Long term (current) use of oral hypoglycemic drugs: Secondary | ICD-10-CM

## 2023-10-02 DIAGNOSIS — E1151 Type 2 diabetes mellitus with diabetic peripheral angiopathy without gangrene: Secondary | ICD-10-CM | POA: Diagnosis present

## 2023-10-02 DIAGNOSIS — Z89422 Acquired absence of other left toe(s): Secondary | ICD-10-CM

## 2023-10-02 DIAGNOSIS — R253 Fasciculation: Secondary | ICD-10-CM | POA: Diagnosis present

## 2023-10-02 DIAGNOSIS — Z833 Family history of diabetes mellitus: Secondary | ICD-10-CM

## 2023-10-02 DIAGNOSIS — R54 Age-related physical debility: Secondary | ICD-10-CM | POA: Diagnosis present

## 2023-10-02 DIAGNOSIS — E1143 Type 2 diabetes mellitus with diabetic autonomic (poly)neuropathy: Secondary | ICD-10-CM | POA: Diagnosis present

## 2023-10-02 DIAGNOSIS — Z9049 Acquired absence of other specified parts of digestive tract: Secondary | ICD-10-CM

## 2023-10-02 DIAGNOSIS — N179 Acute kidney failure, unspecified: Secondary | ICD-10-CM | POA: Diagnosis present

## 2023-10-02 DIAGNOSIS — Z803 Family history of malignant neoplasm of breast: Secondary | ICD-10-CM

## 2023-10-02 DIAGNOSIS — I2510 Atherosclerotic heart disease of native coronary artery without angina pectoris: Secondary | ICD-10-CM | POA: Diagnosis present

## 2023-10-02 DIAGNOSIS — I441 Atrioventricular block, second degree: Secondary | ICD-10-CM | POA: Diagnosis present

## 2023-10-02 DIAGNOSIS — I6522 Occlusion and stenosis of left carotid artery: Secondary | ICD-10-CM | POA: Diagnosis present

## 2023-10-02 DIAGNOSIS — R8290 Unspecified abnormal findings in urine: Secondary | ICD-10-CM | POA: Diagnosis present

## 2023-10-02 DIAGNOSIS — R531 Weakness: Secondary | ICD-10-CM

## 2023-10-02 DIAGNOSIS — I130 Hypertensive heart and chronic kidney disease with heart failure and stage 1 through stage 4 chronic kidney disease, or unspecified chronic kidney disease: Secondary | ICD-10-CM | POA: Diagnosis present

## 2023-10-02 DIAGNOSIS — E872 Acidosis, unspecified: Secondary | ICD-10-CM | POA: Diagnosis not present

## 2023-10-02 DIAGNOSIS — I959 Hypotension, unspecified: Secondary | ICD-10-CM | POA: Diagnosis present

## 2023-10-02 DIAGNOSIS — I43 Cardiomyopathy in diseases classified elsewhere: Secondary | ICD-10-CM | POA: Diagnosis present

## 2023-10-02 DIAGNOSIS — Z79899 Other long term (current) drug therapy: Secondary | ICD-10-CM

## 2023-10-02 DIAGNOSIS — Z9884 Bariatric surgery status: Secondary | ICD-10-CM

## 2023-10-02 DIAGNOSIS — I5032 Chronic diastolic (congestive) heart failure: Secondary | ICD-10-CM | POA: Diagnosis present

## 2023-10-02 DIAGNOSIS — E785 Hyperlipidemia, unspecified: Secondary | ICD-10-CM | POA: Diagnosis present

## 2023-10-02 DIAGNOSIS — E1122 Type 2 diabetes mellitus with diabetic chronic kidney disease: Secondary | ICD-10-CM | POA: Diagnosis present

## 2023-10-02 DIAGNOSIS — Z7901 Long term (current) use of anticoagulants: Secondary | ICD-10-CM

## 2023-10-02 DIAGNOSIS — Z89421 Acquired absence of other right toe(s): Secondary | ICD-10-CM

## 2023-10-02 DIAGNOSIS — Z923 Personal history of irradiation: Secondary | ICD-10-CM

## 2023-10-02 DIAGNOSIS — N1832 Chronic kidney disease, stage 3b: Secondary | ICD-10-CM | POA: Diagnosis present

## 2023-10-02 LAB — URINALYSIS, COMPLETE (UACMP) WITH MICROSCOPIC
Bilirubin Urine: NEGATIVE
Glucose, UA: NEGATIVE mg/dL
Hgb urine dipstick: NEGATIVE
Ketones, ur: NEGATIVE mg/dL
Nitrite: NEGATIVE
Protein, ur: NEGATIVE mg/dL
Specific Gravity, Urine: 1.01 (ref 1.005–1.030)
pH: 5 (ref 5.0–8.0)

## 2023-10-02 LAB — COMPREHENSIVE METABOLIC PANEL
ALT: 84 U/L — ABNORMAL HIGH (ref 0–44)
AST: 67 U/L — ABNORMAL HIGH (ref 15–41)
Albumin: 3.2 g/dL — ABNORMAL LOW (ref 3.5–5.0)
Alkaline Phosphatase: 94 U/L (ref 38–126)
Anion gap: 12 (ref 5–15)
BUN: 54 mg/dL — ABNORMAL HIGH (ref 8–23)
CO2: 16 mmol/L — ABNORMAL LOW (ref 22–32)
Calcium: 8.4 mg/dL — ABNORMAL LOW (ref 8.9–10.3)
Chloride: 109 mmol/L (ref 98–111)
Creatinine, Ser: 4.31 mg/dL — ABNORMAL HIGH (ref 0.44–1.00)
GFR, Estimated: 10 mL/min — ABNORMAL LOW (ref >60)
Glucose, Bld: 89 mg/dL (ref 70–99)
Potassium: 4.5 mmol/L (ref 3.5–5.1)
Sodium: 137 mmol/L (ref 135–145)
Total Bilirubin: 0.8 mg/dL (ref <1.2)
Total Protein: 6.4 g/dL — ABNORMAL LOW (ref 6.5–8.1)

## 2023-10-02 LAB — CBC
HCT: 38.9 % (ref 36.0–46.0)
Hemoglobin: 12.9 g/dL (ref 12.0–15.0)
MCH: 29.1 pg (ref 26.0–34.0)
MCHC: 33.2 g/dL (ref 30.0–36.0)
MCV: 87.6 fL (ref 80.0–100.0)
Platelets: 180 10*3/uL (ref 150–400)
RBC: 4.44 MIL/uL (ref 3.87–5.11)
RDW: 14.9 % (ref 11.5–15.5)
WBC: 11.3 10*3/uL — ABNORMAL HIGH (ref 4.0–10.5)
nRBC: 0 % (ref 0.0–0.2)

## 2023-10-02 LAB — GLUCOSE, CAPILLARY: Glucose-Capillary: 91 mg/dL (ref 70–99)

## 2023-10-02 LAB — TROPONIN I (HIGH SENSITIVITY): Troponin I (High Sensitivity): 10 ng/L (ref <18)

## 2023-10-02 LAB — CBG MONITORING, ED
Glucose-Capillary: 84 mg/dL (ref 70–99)
Glucose-Capillary: 73 mg/dL (ref 70–99)

## 2023-10-02 LAB — BRAIN NATRIURETIC PEPTIDE: B Natriuretic Peptide: 116.7 pg/mL — ABNORMAL HIGH (ref 0.0–100.0)

## 2023-10-02 MED ORDER — TAFAMIDIS MEGLUMINE (CARDIAC) 20 MG PO CAPS
40.0000 mg | ORAL_CAPSULE | Freq: Every day | ORAL | Status: DC
  Administered 2023-10-02: 40 mg via ORAL
  Filled 2023-10-02 (×3): qty 2

## 2023-10-02 MED ORDER — INSULIN ASPART 100 UNIT/ML IJ SOLN
0.0000 [IU] | Freq: Three times a day (TID) | INTRAMUSCULAR | Status: DC
  Administered 2023-10-04: 1 [IU] via SUBCUTANEOUS
  Administered 2023-10-05 – 2023-10-06 (×2): 2 [IU] via SUBCUTANEOUS
  Administered 2023-10-06: 1 [IU] via SUBCUTANEOUS
  Administered 2023-10-07: 2 [IU] via SUBCUTANEOUS
  Administered 2023-10-08 – 2023-10-09 (×4): 1 [IU] via SUBCUTANEOUS
  Administered 2023-10-10: 2 [IU] via SUBCUTANEOUS
  Filled 2023-10-02 (×10): qty 1

## 2023-10-02 MED ORDER — TAFAMIDIS MEGLUMINE (CARDIAC) 20 MG PO CAPS
80.0000 mg | ORAL_CAPSULE | Freq: Every day | ORAL | Status: DC
  Filled 2023-10-02: qty 4

## 2023-10-02 MED ORDER — ACETAMINOPHEN 325 MG PO TABS: 650.0000 mg | ORAL_TABLET | Freq: Four times a day (QID) | ORAL | Status: DC | PRN

## 2023-10-02 MED ORDER — ORAL CARE MOUTH RINSE: 15.0000 mL | OROMUCOSAL | Status: DC | PRN

## 2023-10-02 MED ORDER — GABAPENTIN 300 MG PO CAPS
300.0000 mg | ORAL_CAPSULE | Freq: Every day | ORAL | Status: DC
  Administered 2023-10-02 – 2023-10-09 (×9): 300 mg via ORAL
  Filled 2023-10-02 (×9): qty 1

## 2023-10-02 MED ORDER — SODIUM BICARBONATE 650 MG PO TABS
650.0000 mg | ORAL_TABLET | Freq: Two times a day (BID) | ORAL | Status: DC
  Administered 2023-10-02 – 2023-10-07 (×11): 650 mg via ORAL
  Filled 2023-10-02 (×11): qty 1

## 2023-10-02 MED ORDER — APIXABAN 5 MG PO TABS
5.0000 mg | ORAL_TABLET | Freq: Two times a day (BID) | ORAL | Status: DC
Start: 2023-10-02 — End: 2023-10-10
  Administered 2023-10-02 – 2023-10-10 (×16): 5 mg via ORAL
  Filled 2023-10-02 (×16): qty 1

## 2023-10-02 MED ORDER — SODIUM CHLORIDE 0.9 % IV BOLUS
1000.0000 mL | Freq: Once | INTRAVENOUS | Status: AC
  Administered 2023-10-02: 1000 mL via INTRAVENOUS

## 2023-10-02 MED ORDER — SODIUM CHLORIDE 0.9% FLUSH
10.0000 mL | Freq: Two times a day (BID) | INTRAVENOUS | Status: DC
  Administered 2023-10-02 – 2023-10-06 (×9): 10 mL via INTRAVENOUS

## 2023-10-02 MED ORDER — PRAVASTATIN SODIUM 40 MG PO TABS
40.0000 mg | ORAL_TABLET | Freq: Every day | ORAL | Status: DC
  Administered 2023-10-02 – 2023-10-10 (×9): 40 mg via ORAL
  Filled 2023-10-02 (×3): qty 1
  Filled 2023-10-02 (×3): qty 2
  Filled 2023-10-02 (×2): qty 1
  Filled 2023-10-02: qty 2

## 2023-10-02 MED ORDER — ORAL CARE MOUTH RINSE
15.0000 mL | OROMUCOSAL | Status: DC
  Administered 2023-10-02 – 2023-10-03 (×4): 15 mL via OROMUCOSAL
  Filled 2023-10-02 (×6): qty 15

## 2023-10-02 MED ORDER — DOXYCYCLINE HYCLATE 100 MG PO TABS
100.0000 mg | ORAL_TABLET | Freq: Two times a day (BID) | ORAL | Status: AC
  Administered 2023-10-02 – 2023-10-04 (×4): 100 mg via ORAL
  Filled 2023-10-02 (×4): qty 1

## 2023-10-02 NOTE — ED Notes (Signed)
Per Pharmacist Tafamidis Meglumine CAPS to be brought down in person.

## 2023-10-02 NOTE — ED Notes (Signed)
Call made to assigned unit due to delay in transport >1 hour due to no handoff completion. Unit secretary response of shift change and unknown new assigned RN. Will locate and instruct to complete.

## 2023-10-02 NOTE — ED Notes (Signed)
Husband is bringing tafamidis meglumine (cardiac) med from home. Pharmacy aware.

## 2023-10-02 NOTE — ED Provider Notes (Signed)
[facility] Provider Note    Event Date/Time   First MD Initiated Contact with Patient 10/02/23 1336     (approximate)  History   Chief Complaint: Seizures  HPI  NICOLETA DEMMITT is a 72 y.o. female with a past medical history of diabetes, hypertension, CVA with left arm weakness, atrial fibrillation on Eliquis, CHF on Lasix, presents to the emergency department for possible partial seizures as well as worsening weakness.  According to the patient she had an episode today lasting approximately 5 minutes in which her left forehead and side of her head was twitching.  Patient states this has happened before she is currently seeing a neurologist and is being worked up for possible seizures.  Patient denies any loss of consciousness at any point.  Patient also found to be hypotensive 86/50 in triage.  Patient states for the last few weeks she has been becoming progressively more weak and states for the last 2 weeks she has had a lot of trouble ambulating due to a feeling that her legs will give out under her if she tries to walk.  Denies any chest pain or shortness of breath.  Denies any recent illnesses such as fever cough congestion vomiting or diarrhea.  No urinary symptoms.  Physical Exam   Triage Vital Signs: ED Triage Vitals [10/02/23 1318]  Encounter Vitals Group     BP (!) 86/50     Systolic BP Percentile      Diastolic BP Percentile      Pulse Rate 73     Resp 20     Temp (!) 97.5 F (36.4 C)     Temp Source Oral     SpO2 99 %     Weight 194 lb (88 kg)     Height 5\' 3"  (1.6 m)     Head Circumference      Peak Flow      Pain Score 0     Pain Loc      Pain Education      Exclude from Growth Chart     Most recent vital signs: Vitals:   10/02/23 1318  BP: (!) 86/50  Pulse: 73  Resp: 20  Temp: (!) 97.5 F (36.4 C)  SpO2: 99%    General: Awake, no distress.  CV:  Good peripheral perfusion.  Regular rate and rhythm  Resp:  Normal effort.   Equal breath sounds bilaterally.  Abd:  No distention.  Soft, nontender.  No rebound or guarding. Other:  1-2+ lower extremity edema bilaterally, patient states this is fairly typical for her.   ED Results / Procedures / Treatments   EKG  EKG viewed and interpreted by myself shows what appears to be atrial fibrillation at 71 bpm with a narrow QRS, normal axis, normal intervals, no concerning ST changes.  History of A-fib documented, on Eliquis.  RADIOLOGY  I have reviewed and interpreted CT head images.  Patient appears to have an older appearing right frontal infarct but I do not see any acute bleed.   MEDICATIONS ORDERED IN ED: Medications  sodium chloride 0.9 % bolus 1,000 mL (has no administration in time range)     IMPRESSION / MDM / ASSESSMENT AND PLAN / ED COURSE  I reviewed the triage vital signs and the nursing notes.  Patient's presentation is most consistent with acute presentation with potential threat to life or bodily function.  Patient presents to the emergency department for 2 to 3 weeks of  progressive weakness, facial twitching this morning.  Patient found to be hypotensive currently 86/50.  No other significant findings on exam.  No abdominal pain no chest pain.  Patient does state many recent falls due to generalized weakness.  We will obtain a CT scan of the head as a precaution.  We will check labs including a CBC chemistry heart enzyme and BNP.  Will obtain a urine sample and continue to closely monitor.  We will IV hydrate while awaiting lab results.  Differential would include electrolyte or metabolic abnormality, dehydration, AKI, ACS, intracranial abnormality.  Patient agreeable to plan of care and workup.  Currently lying in bed, awake alert oriented x 4 answers questions completely follows commands well.  No distress.  Patient's description of the twitching of her face could be indicative of a partial seizure.  Patient states she is currently seeing a  neurologist has had an EEG and has more testing scheduled next week but they are not yet starting any medication.  Patient's labs have resulted showing a reassuring CBC.  Patient's chemistry however shows acute renal insufficiency with a creatinine of 4.3 her baseline appears to be closer to 1-1.5 although recently was slightly elevated over 2.  Patient's LFTs are slightly elevated this can possibly be due to sustained hypotension/shock liver.  Patient receiving IV fluids and her blood pressure appears to be improving.  We will continue with IV hydration will admit to the hospital service for further workup and treatment.  Patient agreeable to plan.  FINAL CLINICAL IMPRESSION(S) / ED DIAGNOSES   Weakness Acute renal insufficiency   Note:  This document was prepared using Dragon voice recognition software and may include unintentional dictation errors.   Minna Antis, MD 10/02/23 1500

## 2023-10-02 NOTE — ED Notes (Signed)
ED Provider at bedside. 

## 2023-10-02 NOTE — ED Notes (Signed)
CT tech transporting patient to CT.

## 2023-10-02 NOTE — ED Notes (Signed)
Attempted to obtain urine from patient. Patient stated "Im not ready to pee yet".

## 2023-10-02 NOTE — ED Notes (Addendum)
Tafamidis Meglumine CAPS not administered due to pharmacy not having this medication or alternate in stock. Patients family member went to get the medication he brought back 40 mg. Speaking to Pharmacist now.

## 2023-10-02 NOTE — ED Notes (Signed)
Medications due are not yet verified by pharmacy.

## 2023-10-02 NOTE — ED Notes (Signed)
Pt has supper tray.

## 2023-10-02 NOTE — H&P (Signed)
History and Physical    ATARA STANKIEWICZ ZOX:[id number] DOB: [birth year] DOA: 10/02/2023  PCP: Leanna Sato, MD (Confirm with patient/family/NH records and if not entered, this has to be entered at [facility] point of entry) Patient coming from: Home  I have personally briefly reviewed patient's old medical records in [facility]  Chief Complaint: Seizure  HPI: Elizabeth [name] is a 72 y.o. female with medical history significant of remote history of partial seizure of Dilantin, recently diagnosed with cardiac amyloidosis, HTN, HLD, chronic A-fib on Eliquis, chronic lymphedema, IIDM, CKD stage IIIb, diabetic neuropathy, presented with worsening of generalized weakness and new onset of partial seizure.  Patient has a remote history of partial seizure with right facial tingling and muscle twitching about 30 years ago, she was on Dilantin for some years then discontinued and she has remained seizure-free for last 30 years.  Patient was recently diagnosed with cardiac amyloidosis and no started on tafamidis meglumine, she just started to taking the pills yesterday.  In addition, and also has a chronic bilateral lower extremity lymphedema chronic wound has been followed with podiatry who started patient on Levaquin 250 mg daily 5 days ago for a small ulcer.  This morning, patient was at rest and started to feel labs forehead and left face tingling sensation and then followed by muscle twitching, she could not stop the twitching by holding the left face and symptoms lasted 5 minutes.  Denied any LOC, no shaking of other parts of the body.  Recently patient also has worsening of peripheral edema and cardiologist increased her Lasix dosage 7 days ago.  Patient does report of increasing urine output and improved leg swelling.  She denied any lightheadedness blurry vision shortness of breath or chest pain. ED Course: Afebrile, blood pressure borderline low responded to IV fluid.  Blood work showed creatinine  4.3 compared to her baseline 1.4-1.6 about 1 month ago, CT head negative for acute findings.  Review of Systems: As per HPI otherwise 14 point review of systems negative.    Past Medical History:  Diagnosis Date   Cancer [facility])    breast   Carcinoma of right breast (HCC) 05/20/2014   Diabetes mellitus without complication (HCC)    Hypertension    Lymphedema    Personal history of radiation therapy 2015   Right breast   PONV (postoperative nausea and vomiting)    Psoriasis 1990   Stroke (HCC) 2000   residual left arm weakness   Varicose veins     Past Surgical History:  Procedure Laterality Date   AMPUTATION Left 03/04/2021   Procedure: AMPUTATION RAY;  Surgeon: Linus Galas, DPM;  Location: [facility] ORS;  Service: Podiatry;  Laterality: Left;   AMPUTATION TOE Right 01/25/2017   Procedure: AMPUTATION TOE   ;  Surgeon: Recardo Evangelist, DPM;  Location: [facility] ORS;  Service: Podiatry;  Laterality: Right;   BREAST EXCISIONAL BIOPSY Right 2015   [facility] / with radiation   BREAST LUMPECTOMY Right 04-2014   followed by radiation,  no chemo   BREAST REDUCTION SURGERY  1977   CHOLECYSTECTOMY N/A 09/23/2015   Procedure: LAPAROSCOPIC CHOLECYSTECTOMY;  Surgeon: Geoffry Paradise, MD;  Location: [facility] ORS;  Service: General;  Laterality: N/A;   DILATION AND CURETTAGE OF UTERUS  2010   EYE SURGERY Left 2015   cataracts and left eye detached retina repair   EYE SURGERY Right 2013   cataract with len implant   GASTRIC ROUX-EN-Y N/A 09/23/2015   Procedure: LAPAROSCOPIC  ROUX-EN-Y GASTRIC;  Surgeon: Geoffry Paradise, MD;  Location: [facility] ORS;  Service: General;  Laterality: N/A;   HIATAL HERNIA REPAIR N/A 09/23/2015   Procedure: LAPAROSCOPIC REPAIR OF HIATAL HERNIA;  Surgeon: Geoffry Paradise, MD;  Location: [facility] ORS;  Service: General;  Laterality: N/A;   IRRIGATION AND DEBRIDEMENT FOOT Right 01/25/2017   Procedure: IRRIGATION AND DEBRIDEMENT FOOT;  Surgeon: Recardo Evangelist, DPM;  Location: [facility] ORS;  Service: Podiatry;   Laterality: Right;   IRRIGATION AND DEBRIDEMENT FOOT Right 01/29/2017   Procedure: IRRIGATION AND DEBRIDEMENT FOOT and application of wound vac;  Surgeon: Recardo Evangelist, DPM;  Location: [facility] OR[facility];  Service: Podiatry;  Laterality: Right;   LOWER EXTREMITY ANGIOGRAPHY Right 01/28/2017   Procedure: Lower Extremity Angiography;  Surgeon: Renford Dills, MD;  Location: [facility];  Service: Cardiovascular;  Laterality: Right;   LOWER EXTREMITY ANGIOGRAPHY Left 03/06/2021   Procedure: Lower Extremity Angiography;  Surgeon: Renford Dills, MD;  Location: [facility];  Service: Cardiovascular;  Laterality: Left;   LOWER EXTREMITY INTERVENTION  01/28/2017   Procedure: Lower Extremity Intervention;  Surgeon: Renford Dills, MD;  Location: [facility];  Service: Cardiovascular;;   REDUCTION MAMMAPLASTY       reports that she has never smoked. She has never been exposed to tobacco smoke. She has never used smokeless tobacco. She reports that she does not drink alcohol and does not use drugs.  No Known Allergies  Family History  Problem Relation Age of Onset   Diabetes Mother    Cancer Mother    Breast cancer Mother 45   Diabetes Father    Diabetes Sister    Diabetes Brother     Prior to Admission medications   Medication Sig Start Date End Date Taking? Authorizing Provider  acetaminophen (TYLENOL) 325 MG tablet Take 2 tablets (650 mg total) by mouth every 6 (six) hours as needed for mild pain (or Fever >/= 101). 03/07/21   Pennie Banter, DO  apixaban (ELIQUIS) 5 MG TABS tablet Take 1 tablet (5 mg total) by mouth 2 (two) times daily. 07/21/23   Charlsie Quest, NP  apixaban (ELIQUIS) 5 MG TABS tablet Take 1 tablet (5 mg total) by mouth 2 (two) times daily. 07/21/23   Charlsie Quest, NP  Calcium Carb-Cholecalciferol (CALCIUM 1000 + D PO) Take 1 tablet by mouth daily.    [provider]  furosemide (LASIX) 40 MG tablet Take 1 tablet (40 mg total) by mouth  daily. 08/24/23   Lanier Prude, MD  furosemide (LASIX) 40 MG tablet Take 1 tablet (40 mg total) by mouth 2 (two) times daily. 09/23/23 12/22/23  Romie Minus, MD  gabapentin (NEURONTIN) 300 MG capsule Take 300 mg by mouth daily. 06/14/23   [provider]  levofloxacin (LEVAQUIN) 250 MG tablet Take by mouth daily. Unknown dosage - started 11/14 per pt    [provider]  linagliptin (TRADJENTA) 5 MG TABS tablet Take 5 mg by mouth daily.    [provider]  lisinopril (ZESTRIL) 10 MG tablet Take 1 tablet (10 mg total) by mouth daily. 09/23/23 12/22/23  Romie Minus, MD  potassium chloride (KLOR-CON) 10 MEQ tablet Take 2 tablets (20 mEq total) by mouth daily. 09/13/23   Charlsie Quest, NP  pravastatin (PRAVACHOL) 40 MG tablet Take 40 mg by mouth daily. In afternoon    [provider]  spironolactone (ALDACTONE) 25 MG tablet Take 1 tablet (25 mg total) by mouth  daily. 08/26/23   Romie Minus, MD  Tafamidis Meglumine, Cardiac, 20 MG CAPS Take 4 capsules (80 mg total) by mouth daily. 09/23/23   Romie Minus, MD  TRUE METRIX BLOOD GLUCOSE TEST test strip  10/13/18   [provider]  TRUEPLUS LANCETS 33G MISC  10/13/18   [provider]    Physical Exam: Vitals:   10/02/23 1318 10/02/23 1430  BP: (!) 86/50 (!) 102/58  Pulse: 73 66  Resp: 20 16  Temp: (!) 97.5 F (36.4 C)   TempSrc: Oral   SpO2: 99% 100%  Weight: 88 kg   Height: 5\' 3"  (1.6 m)     Constitutional: NAD, calm, comfortable Vitals:   10/02/23 1318 10/02/23 1430  BP: (!) 86/50 (!) 102/58  Pulse: 73 66  Resp: 20 16  Temp: (!) 97.5 F (36.4 C)   TempSrc: Oral   SpO2: 99% 100%  Weight: 88 kg   Height: 5\' 3"  (1.6 m)    Eyes: PERRL, lids and conjunctivae normal ENMT: Mucous membranes are dry. Posterior pharynx clear of any exudate or lesions.Normal dentition.  Neck: normal, supple, no masses, no thyromegaly Respiratory: clear to auscultation  bilaterally, no wheezing, no crackles. Normal respiratory effort. No accessory muscle use.  Cardiovascular: Regular rate and rhythm, no murmurs / rubs / gallops.  Chronic lymphedema with multiple small skin openings on bilateral lower extremities. 2+ pedal pulses. No carotid bruits.  Abdomen: no tenderness, no masses palpated. No hepatosplenomegaly. Bowel sounds positive.  Musculoskeletal: no clubbing / cyanosis. No joint deformity upper and lower extremities. Good ROM, no contractures. Normal muscle tone.  Skin: no rashes, lesions, ulcers. No induration Neurologic: CN 2-12 grossly intact. Sensation intact, DTR normal. Strength 5/5 in all 4.  Psychiatric: Normal judgment and insight. Alert and oriented x 3. Normal mood.     Labs on Admission: I have personally reviewed following labs and imaging studies  CBC: Recent Labs  Lab 10/02/23 1327  WBC 11.3*  HGB 12.9  HCT 38.9  MCV 87.6  PLT 180   Basic Metabolic Panel: Recent Labs  Lab 10/02/23 1327  NA 137  K 4.5  CL 109  CO2 16*  GLUCOSE 89  BUN 54*  CREATININE 4.31*  CALCIUM 8.4*   GFR: Estimated Creatinine Clearance: 12.6 mL/min (A) (by C-G formula based on SCr of 4.31 mg/dL (H)). Liver Function Tests: Recent Labs  Lab 10/02/23 1327  AST 67*  ALT 84*  ALKPHOS 94  BILITOT 0.8  PROT 6.4*  ALBUMIN 3.2*   No results for input(s): "LIPASE", "AMYLASE" in the last 168 hours. No results for input(s): "AMMONIA" in the last 168 hours. Coagulation Profile: No results for input(s): "INR", "PROTIME" in the last 168 hours. Cardiac Enzymes: No results for input(s): "CKTOTAL", "CKMB", "CKMBINDEX", "TROPONINI" in the last 168 hours. BNP (last 3 results) No results for input(s): "PROBNP" in the last 8760 hours. HbA1C: No results for input(s): "HGBA1C" in the last 72 hours. CBG: Recent Labs  Lab 10/02/23 1327  GLUCAP 84   Lipid Profile: No results for input(s): "CHOL", "HDL", "LDLCALC", "TRIG", "CHOLHDL", "LDLDIRECT" in  the last 72 hours. Thyroid Function Tests: No results for input(s): "TSH", "T4TOTAL", "FREET4", "T3FREE", "THYROIDAB" in the last 72 hours. Anemia Panel: No results for input(s): "VITAMINB12", "FOLATE", "FERRITIN", "TIBC", "IRON", "RETICCTPCT" in the last 72 hours. Urine analysis:    Component Value Date/Time   COLORURINE YELLOW (A) 10/02/2023 1555   APPEARANCEUR HAZY (A) 10/02/2023 1555   APPEARANCEUR Cloudy 01/05/2012 1249  LABSPEC 1.010 10/02/2023 1555   LABSPEC 1.027 01/05/2012 1249   PHURINE 5.0 10/02/2023 1555   GLUCOSEU NEGATIVE 10/02/2023 1555   GLUCOSEU 150 mg/dL 47/42/5956 3875   HGBUR NEGATIVE 10/02/2023 1555   BILIRUBINUR NEGATIVE 10/02/2023 1555   BILIRUBINUR Negative 01/05/2012 1249   KETONESUR NEGATIVE 10/02/2023 1555   PROTEINUR NEGATIVE 10/02/2023 1555   NITRITE NEGATIVE 10/02/2023 1555   LEUKOCYTESUR MODERATE (A) 10/02/2023 1555   LEUKOCYTESUR Negative 01/05/2012 1249    Radiological Exams on Admission: CT HEAD WO CONTRAST ( )  Result Date: 10/02/2023 CLINICAL DATA:  History EXAM: CT HEAD WITHOUT CONTRAST TECHNIQUE: Contiguous axial images were obtained from the base of the skull through the vertex without intravenous contrast. RADIATION DOSE REDUCTION: This exam was performed according to the departmental dose-optimization program which includes automated exposure control, adjustment of the mA and/or kV according to patient size and/or use of iterative reconstruction technique. COMPARISON:  07/02/23 CT head FINDINGS: Brain: No hemorrhage. No hydrocephalus. No extra-axial fluid collection. No CT evidence of an acute cortical infarct. No mass effect. No mass lesion. There is chronic infarcts right frontal lobe and basal ganglia. There is also a chronic infarct in the right occipital lobe Vascular: No hyperdense vessel or unexpected calcification. Skull: Normal. Negative for fracture or focal lesion. Sinuses/Orbits: Middle ear or mastoid effusion. Paranasal sinuses are  clear. Right lens replacement. 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 Other: None. IMPRESSION: No hemorrhage or CT evidence of an acute cortical infarct. Electronically Signed   By: Lorenza Cambridge M.D.   On: 10/02/2023 15:29    EKG: Independently reviewed.  A-fib, no acute ST changes.  Assessment/Plan Principal Problem:   Seizure (HCC) Active Problems:   AKI (acute kidney injury) (HCC)   Partial seizure (HCC)   Cardiac amyloidosis (HCC)  (please populate well all problems here in Problem List. (For example, if patient is on BP meds at home and you resume or decide to hold them, it is a problem that needs to be her. Same for CAD, COPD, HLD and so on)  Partial seizure -Clinically suspect this is related to recent use of Levaquin which lowered seizure threshold as patient has remote history of partial seizure 30 years ago.  On top of that patient has been having AKI process, as a result there might be a toxic level of Levaquin buildup lower seizure threshold. -Check EEG -Discontinue Levaquin, change to doxycycline for 2 more days -It was discussed with on-call neurology  AKI on CKD stage IIIb -Prerenal, clinically volume contracted with hypotension, which responded to IV fluid -Hold off diuresis, hold off spironolactone and ACEI -Repeat kidney function tomorrow  Deconditioning -PT OT evaluation  Recently diagnosed cardiac amyloidosis -tafamidis meglumine as per cardiology  Chronic A-fib -Controlled, continue Eliquis  IIDM -SSI for now  Abnormal UA -Negative urine protein, low suspicion for renal amyloidosis at this point -Leukocytes in urine but negative for nitrite and presence of epithelial cells indicating contamination.  Patient has no symptoms UTI, monitor off antibiotics.  Chronic lymphedema with chronic ultrasound bilateral lower extremities -Outpatient follow-up with podiatry and wound care  DVT prophylaxis: Eliquis Code Status: Full code Family Communication: Husband at  bedside Disposition Plan: Expect less than 2 midnight hospital stay Consults called: Curbside consult with neurology Admission status: Telemetry observation   Emeline General MD [facility] Pager [phone]  10/02/2023, 4:13 PM

## 2023-10-02 NOTE — ED Triage Notes (Signed)
Pt states she had a seizure that lasted for 5 minutes today. Pt states history of seizures but does not take any medication for them. 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  Pt states normal BP is 130/70.

## 2023-10-02 NOTE — Progress Notes (Signed)
Patient arrived to this unit. A&Ox4 patient denies any sOB, denies pain. Patient has scratch marks to RLQ abdomen scabbed over. Patient has MASD under abdominal folds and in groin. Rn to put antifungal powder on groin and interdry to abdominal folds.

## 2023-10-03 ENCOUNTER — Encounter: Payer: Self-pay | Admitting: Internal Medicine

## 2023-10-03 ENCOUNTER — Other Ambulatory Visit: Payer: Self-pay

## 2023-10-03 ENCOUNTER — Ambulatory Visit: Payer: Medicare PPO

## 2023-10-03 DIAGNOSIS — I959 Hypotension, unspecified: Secondary | ICD-10-CM | POA: Diagnosis not present

## 2023-10-03 DIAGNOSIS — R531 Weakness: Secondary | ICD-10-CM | POA: Diagnosis not present

## 2023-10-03 DIAGNOSIS — N179 Acute kidney failure, unspecified: Secondary | ICD-10-CM

## 2023-10-03 DIAGNOSIS — I4891 Unspecified atrial fibrillation: Secondary | ICD-10-CM

## 2023-10-03 DIAGNOSIS — E854 Organ-limited amyloidosis: Secondary | ICD-10-CM | POA: Diagnosis present

## 2023-10-03 DIAGNOSIS — Z7984 Long term (current) use of oral hypoglycemic drugs: Secondary | ICD-10-CM | POA: Diagnosis not present

## 2023-10-03 DIAGNOSIS — I4819 Other persistent atrial fibrillation: Secondary | ICD-10-CM | POA: Diagnosis present

## 2023-10-03 DIAGNOSIS — N289 Disorder of kidney and ureter, unspecified: Secondary | ICD-10-CM | POA: Diagnosis not present

## 2023-10-03 DIAGNOSIS — Z853 Personal history of malignant neoplasm of breast: Secondary | ICD-10-CM | POA: Diagnosis not present

## 2023-10-03 DIAGNOSIS — I43 Cardiomyopathy in diseases classified elsewhere: Secondary | ICD-10-CM | POA: Diagnosis present

## 2023-10-03 DIAGNOSIS — R569 Unspecified convulsions: Secondary | ICD-10-CM | POA: Diagnosis present

## 2023-10-03 DIAGNOSIS — I83009 Varicose veins of unspecified lower extremity with ulcer of unspecified site: Secondary | ICD-10-CM | POA: Diagnosis present

## 2023-10-03 DIAGNOSIS — I2510 Atherosclerotic heart disease of native coronary artery without angina pectoris: Secondary | ICD-10-CM | POA: Diagnosis present

## 2023-10-03 DIAGNOSIS — E1151 Type 2 diabetes mellitus with diabetic peripheral angiopathy without gangrene: Secondary | ICD-10-CM | POA: Diagnosis present

## 2023-10-03 DIAGNOSIS — E1122 Type 2 diabetes mellitus with diabetic chronic kidney disease: Secondary | ICD-10-CM | POA: Diagnosis present

## 2023-10-03 DIAGNOSIS — I130 Hypertensive heart and chronic kidney disease with heart failure and stage 1 through stage 4 chronic kidney disease, or unspecified chronic kidney disease: Secondary | ICD-10-CM | POA: Diagnosis present

## 2023-10-03 DIAGNOSIS — Z923 Personal history of irradiation: Secondary | ICD-10-CM | POA: Diagnosis not present

## 2023-10-03 DIAGNOSIS — Z7901 Long term (current) use of anticoagulants: Secondary | ICD-10-CM | POA: Diagnosis not present

## 2023-10-03 DIAGNOSIS — I6522 Occlusion and stenosis of left carotid artery: Secondary | ICD-10-CM | POA: Diagnosis present

## 2023-10-03 DIAGNOSIS — N1832 Chronic kidney disease, stage 3b: Secondary | ICD-10-CM | POA: Diagnosis present

## 2023-10-03 DIAGNOSIS — Y92009 Unspecified place in unspecified non-institutional (private) residence as the place of occurrence of the external cause: Secondary | ICD-10-CM | POA: Diagnosis not present

## 2023-10-03 DIAGNOSIS — G40909 Epilepsy, unspecified, not intractable, without status epilepticus: Secondary | ICD-10-CM | POA: Diagnosis present

## 2023-10-03 DIAGNOSIS — Z79899 Other long term (current) drug therapy: Secondary | ICD-10-CM | POA: Diagnosis not present

## 2023-10-03 DIAGNOSIS — T368X5A Adverse effect of other systemic antibiotics, initial encounter: Secondary | ICD-10-CM | POA: Diagnosis present

## 2023-10-03 DIAGNOSIS — E872 Acidosis, unspecified: Secondary | ICD-10-CM | POA: Diagnosis not present

## 2023-10-03 DIAGNOSIS — E785 Hyperlipidemia, unspecified: Secondary | ICD-10-CM | POA: Diagnosis present

## 2023-10-03 DIAGNOSIS — E1143 Type 2 diabetes mellitus with diabetic autonomic (poly)neuropathy: Secondary | ICD-10-CM | POA: Diagnosis present

## 2023-10-03 DIAGNOSIS — I69344 Monoplegia of lower limb following cerebral infarction affecting left non-dominant side: Secondary | ICD-10-CM | POA: Diagnosis not present

## 2023-10-03 DIAGNOSIS — I5032 Chronic diastolic (congestive) heart failure: Secondary | ICD-10-CM | POA: Diagnosis present

## 2023-10-03 DIAGNOSIS — I441 Atrioventricular block, second degree: Secondary | ICD-10-CM | POA: Diagnosis present

## 2023-10-03 LAB — BASIC METABOLIC PANEL
Anion gap: 9 (ref 5–15)
Anion gap: 7 (ref 5–15)
BUN: 49 mg/dL — ABNORMAL HIGH (ref 8–23)
BUN: 47 mg/dL — ABNORMAL HIGH (ref 8–23)
CO2: 19 mmol/L — ABNORMAL LOW (ref 22–32)
CO2: 16 mmol/L — ABNORMAL LOW (ref 22–32)
Calcium: 7.7 mg/dL — ABNORMAL LOW (ref 8.9–10.3)
Calcium: 7.6 mg/dL — ABNORMAL LOW (ref 8.9–10.3)
Chloride: 110 mmol/L (ref 98–111)
Chloride: 112 mmol/L — ABNORMAL HIGH (ref 98–111)
Creatinine, Ser: 3.7 mg/dL — ABNORMAL HIGH (ref 0.44–1.00)
Creatinine, Ser: 3.31 mg/dL — ABNORMAL HIGH (ref 0.44–1.00)
GFR, Estimated: 13 mL/min — ABNORMAL LOW (ref >60)
GFR, Estimated: 14 mL/min — ABNORMAL LOW (ref >60)
Glucose, Bld: 80 mg/dL (ref 70–99)
Glucose, Bld: 144 mg/dL — ABNORMAL HIGH (ref 70–99)
Potassium: 4.1 mmol/L (ref 3.5–5.1)
Potassium: 3.9 mmol/L (ref 3.5–5.1)
Sodium: 138 mmol/L (ref 135–145)
Sodium: 135 mmol/L (ref 135–145)

## 2023-10-03 LAB — LACTIC ACID, PLASMA
Lactic Acid, Venous: 0.9 mmol/L (ref 0.5–1.9)
Lactic Acid, Venous: 1.5 mmol/L (ref 0.5–1.9)

## 2023-10-03 LAB — GLUCOSE, CAPILLARY
Glucose-Capillary: 66 mg/dL — ABNORMAL LOW (ref 70–99)
Glucose-Capillary: 177 mg/dL — ABNORMAL HIGH (ref 70–99)
Glucose-Capillary: 85 mg/dL (ref 70–99)
Glucose-Capillary: 78 mg/dL (ref 70–99)
Glucose-Capillary: 150 mg/dL — ABNORMAL HIGH (ref 70–99)

## 2023-10-03 MED ORDER — DEXTROSE-SODIUM CHLORIDE 5-0.9 % IV SOLN: INTRAVENOUS | Status: DC

## 2023-10-03 MED ORDER — ORAL CARE MOUTH RINSE: 15.0000 mL | OROMUCOSAL | Status: DC | PRN

## 2023-10-03 MED ORDER — CLOBAZAM 2.5 MG/ML PO SUSP
5.0000 mg | Freq: Two times a day (BID) | ORAL | Status: DC
  Administered 2023-10-04 – 2023-10-10 (×14): 5 mg via ORAL
  Filled 2023-10-03 (×14): qty 4

## 2023-10-03 MED ORDER — METOCLOPRAMIDE HCL 5 MG/ML IJ SOLN
5.0000 mg | Freq: Once | INTRAMUSCULAR | Status: DC
  Filled 2023-10-03: qty 2

## 2023-10-03 MED ORDER — ONDANSETRON HCL 4 MG/2ML IJ SOLN
4.0000 mg | Freq: Once | INTRAMUSCULAR | Status: AC
  Administered 2023-10-03: 4 mg via INTRAVENOUS
  Filled 2023-10-03: qty 2

## 2023-10-03 MED ORDER — LEVETIRACETAM 250 MG PO TABS
250.0000 mg | ORAL_TABLET | Freq: Two times a day (BID) | ORAL | Status: DC
  Filled 2023-10-03: qty 1

## 2023-10-03 MED ORDER — TAFAMIDIS MEGLUMINE (CARDIAC) 20 MG PO CAPS
40.0000 mg | ORAL_CAPSULE | Freq: Every day | ORAL | Status: DC
  Administered 2023-10-03 – 2023-10-04 (×2): 40 mg via ORAL
  Filled 2023-10-03: qty 2

## 2023-10-03 MED ORDER — CHLORHEXIDINE GLUCONATE CLOTH 2 % EX PADS
6.0000 | MEDICATED_PAD | Freq: Every day | CUTANEOUS | Status: DC
Start: 2023-10-03 — End: 2023-10-09
  Administered 2023-10-03 – 2023-10-05 (×2): 6 via TOPICAL

## 2023-10-03 MED ORDER — MIDODRINE HCL 5 MG PO TABS
5.0000 mg | ORAL_TABLET | Freq: Three times a day (TID) | ORAL | Status: DC
  Administered 2023-10-03 – 2023-10-10 (×21): 5 mg via ORAL
  Filled 2023-10-03 (×21): qty 1

## 2023-10-03 NOTE — Progress Notes (Signed)
Per Dr Sherryll Burger, dc enteric precautions

## 2023-10-03 NOTE — Progress Notes (Deleted)
Made 3rd attempt (twice yesterday pt was asleep) this morning to bring paperwork for HCPOA and educate pt on documents. Staff was in room and as I arrived to floor I was paged to ED. Will leave note to follow up with daytime chaplain

## 2023-10-03 NOTE — Evaluation (Signed)
Physical Therapy Evaluation Patient Details Name: Elizabeth [name] MRN: [id number] DOB: May 13, 1951 Today's Date: 10/03/2023  History of Present Illness  Pt is a 72 y.o. female presenting to hospital 10/02/23 with c/o possible partial seizures (L forehead and side of her head was twitching for about 5 minutes) as well as worsening weakness.  Pt admitted with partial seizure, AKI on CKD stage IIIb, deconditioning, recently diagnosed cardiac amyloidosis, abnormal UA.  PMH includes DM, htn, CVA with L arm weakness, a-fib on Eliquis, CHF on Lasix, cardiac amyloidosis, chronic lymphedema, CKD, diabetic neuropathy, B toe amputations, R breast lumpectomy, L eye blindness.  Clinical Impression  Prior to recent medical concerns, pt was ambulatory with SPC short household distances; lives with her husband and son in 1 level home with 3 STE B railing; several recent falls reported (legs give out).  No c/o pain during session.  Currently pt is mod assist to stand from recliner up to RW; min assist x2 to ambulate short distance recliner to bed with RW use; and max assist to lay back down in bed.  Limited session d/t transport arrived to take pt for testing.  Pt would currently benefit from skilled PT to address noted impairments and functional limitations (see below for any additional details).  Upon hospital discharge, pt would benefit from ongoing therapy.     If plan is discharge home, recommend the following: Two people to help with walking and/or transfers;A lot of help with bathing/dressing/bathroom;Assistance with cooking/housework;Assist for transportation;Help with stairs or ramp for entrance   Can travel by private vehicle   No    Equipment Recommendations Other (comment) (TBD at next facility)  Recommendations for Other Services       Functional Status Assessment Patient has had a recent decline in their functional status and demonstrates the ability to make significant improvements in function  in a reasonable and predictable amount of time.     Precautions / Restrictions Precautions Precautions: Fall Precaution Comments: seizure Restrictions Weight Bearing Restrictions: No      Mobility  Bed Mobility Overal bed mobility: Needs Assistance Bed Mobility: Sit to Supine       Sit to supine: Max assist   General bed mobility comments: assist for trunk and B LE's    Transfers Overall transfer level: Needs assistance Equipment used: Rolling walker (2 wheels) Transfers: Sit to/from Stand Sit to Stand: Mod assist           General transfer comment: assist to initiate and come to full stand; assist to control descent sitting; vc's for UE/LE placement    Ambulation/Gait Ambulation/Gait assistance: Min assist, +2 physical assistance Gait Distance (Feet): 4 Feet (recliner to bed) Assistive device: Rolling walker (2 wheels)   Gait velocity: decreased     General Gait Details: decreased stance time R LE; increased effort/time to take small step with L LE; increased time to take steps in general  Stairs            Wheelchair Mobility     Tilt Bed    Modified Rankin (Stroke Patients Only)       Balance Overall balance assessment: Needs assistance Sitting-balance support: No upper extremity supported, Feet supported Sitting balance-Leahy Scale: Good Sitting balance - Comments: steady reaching within BOS   Standing balance support: Bilateral upper extremity supported, Reliant on assistive device for balance Standing balance-Leahy Scale: Fair Standing balance comment: steady static standing with B UE support on RW  Pertinent Vitals/Pain Pain Assessment Pain Assessment: No/denies pain HR 55-58 bpm and SpO2 sats 95% or greater on room air during sessions activities.    Home Living Family/patient expects to be discharged to:: Private residence Living Arrangements: Spouse/significant other;Children (Husband and  son) Available Help at Discharge: Family;Available 24 hours/day Type of Home: House Home Access: Stairs to enter Entrance Stairs-Rails: Right;Left;Can reach both Entrance Stairs-Number of Steps: 3   Home Layout: One level Home Equipment: BSC/3in1;Cane - single point;Shower seat;Rollator (4 wheels) Additional Comments: Pt uses bsc next to bed and next to recliner    Prior Function Prior Level of Function : Needs assist;History of Falls (last six months)             Mobility Comments: Pt uses SPC for short household distances.  Pt reports several recent falls (legs give out). ADLs Comments: Per OT eval "Pt requires moderate assist from spouse for toileting, bathing, and dressing and all IADL (before Aug admission, pt was supv for standing bathing, mod indep for grooming, feeding, dressing, toileting, and was managing meds)".     Extremity/Trunk Assessment   Upper Extremity Assessment Upper Extremity Assessment: Generalized weakness    Lower Extremity Assessment Lower Extremity Assessment: Generalized weakness    Cervical / Trunk Assessment Cervical / Trunk Assessment: Other exceptions Cervical / Trunk Exceptions: forward head/shoulders  Communication   Communication Communication: No apparent difficulties Cueing Techniques: Verbal cues;Visual cues  Cognition Arousal: Alert Behavior During Therapy: WFL for tasks assessed/performed Overall Cognitive Status: Within Functional Limits for tasks assessed                                          General Comments  Nursing cleared pt for participation in physical therapy.  Pt agreeable to PT session.    Exercises  Transfer training   Assessment/Plan    PT Assessment Patient needs continued PT services  PT Problem List Decreased strength;Decreased activity tolerance;Decreased balance;Decreased mobility       PT Treatment Interventions DME instruction;Gait training;Stair training;Functional mobility  training;Therapeutic activities;Therapeutic exercise;Balance training;Patient/family education    PT Goals (Current goals can be found in the Care Plan section)  Acute Rehab PT Goals Patient Stated Goal: to improve strength and walking PT Goal Formulation: With patient Time For Goal Achievement: 10/17/23 Potential to Achieve Goals: Good    Frequency Min 1X/week     Co-evaluation               AM-PAC PT "6 Clicks" Mobility  Outcome Measure Help needed turning from your back to your side while in a flat bed without using bedrails?: A Little Help needed moving from lying on your back to sitting on the side of a flat bed without using bedrails?: A Lot Help needed moving to and from a bed to a chair (including a wheelchair)?: A Lot Help needed standing up from a chair using your arms (e.g., wheelchair or bedside chair)?: A Lot Help needed to walk in hospital room?: Total Help needed climbing 3-5 steps with a railing? : Total 6 Click Score: 11    End of Session Equipment Utilized During Treatment: Gait belt Activity Tolerance: Patient limited by fatigue Patient left: in bed;with bed alarm set (transport present to take pt to testing) Nurse Communication: Mobility status;Precautions PT Visit Diagnosis: Other abnormalities of gait and mobility (R26.89);Muscle weakness (generalized) (M62.81);History of falling (Z91.81)    Time: 6962-9528  PT Time Calculation (min) (ACUTE ONLY): 10 min   Charges:   PT Evaluation $PT Eval Low Complexity: 1 Low   PT General Charges $$ ACUTE PT VISIT: 1 Visit        Hendricks Limes, PT 10/03/23, 10:16 AM

## 2023-10-03 NOTE — Progress Notes (Signed)
   10/03/23 1100  Spiritual Encounters  Type of Visit Initial  Reason for visit Advance directives  OnCall Visit Yes  Spiritual Framework  Patient Stress Factors Not reviewed  Spiritual Care Plan  Spiritual Care Issues Still Outstanding No further spiritual care needs at this time (see row info)   Patient does not have the mental capacity to fill out an Advance directive.

## 2023-10-03 NOTE — Progress Notes (Signed)
Eeg done

## 2023-10-03 NOTE — Evaluation (Signed)
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   Clinical Impression   Pt was seen for OT evaluation this date. Pt alert and oriented x4, agreeable to session. Prior to recent August 2024 hospital admission, pt was ambulating PRN with SPC and mod indep with ADL. Pt notes most recently she has required moderate assistance from her spouse for bathing, dressing, and toileting. She has also required more assist for IADL than previous baseline. Pt notes hx of 6 falls in past 72mo. Pt required MIN A for bed mobility, MIN A to stand and step pivot with RW from EOB to [facility] with nurse tech as +2 for safety only. Pt noted to have slight posterior lean requiring VC and MIN A to correct. Pt unable to complete pericare in standing 2/2 poor balance requiring MAX A from nurse tech while OT provided MIN A for static standing balance. Pt able to follow simple commands with PRN VC for safety/RW sequencing during session. Declined further ADL/mobility. Pt presents to acute OT demonstrating impaired ADL performance and functional mobility 2/2 decreased strength, activity tolerance, and balance (See OT problem list for additional functional deficits). Pt currently requires MOD-MAX A for LB ADL, MIN A for ADL mobility with RW, and setup and supv for seated UB ADL. Pt would benefit from skilled OT services to address noted impairments and functional limitations (see below for any additional details) in order to maximize safety and  independence while minimizing falls risk and caregiver burden.      If plan is discharge home, recommend the following: A little help with walking and/or transfers;A lot of help with bathing/dressing/bathroom;Assistance with cooking/housework;Assist for transportation;Help with stairs or ramp for entrance;Direct supervision/assist for medications management    Functional Status Assessment  Patient has had a recent decline in their functional status and demonstrates the ability to make significant improvements in function in a reasonable and predictable amount of time.  Equipment Recommendations  Other (comment) (defer to next venue)    Recommendations for Other Services       Precautions / Restrictions Precautions Precautions: Fall;Other (comment) Precaution Comments: seizure Restrictions Weight Bearing Restrictions: No      Mobility Bed Mobility Overal bed mobility: Needs Assistance Bed Mobility: Supine to Sit, Sit to Supine     Supine to sit: Min assist, HOB elevated, Used rails Sit to supine: Min assist        Transfers Overall transfer level: Needs assistance Equipment used: Rolling walker (2 wheels) Transfers: Sit to/from Stand Sit to Stand: Min assist           General transfer comment: VC for hand placement and to correct for initial posterior lean      Balance Overall balance assessment: Needs assistance Sitting-balance support: No upper extremity supported, Single extremity supported, Feet supported Sitting balance-Leahy Scale: Fair     Standing balance support: Bilateral upper extremity supported, Reliant on assistive device for balance, During functional activity Standing balance-Leahy Scale: Poor Standing balance comment: unable to complete pericare in standing, required BUE on RW for stability and MIN  A to maintain static standing                           ADL either performed or assessed with clinical judgement   ADL Overall ADL's :  Needs assistance/impaired     Grooming: Sitting;Wash/dry face;Set up                   Toilet Transfer: Minimal assistance;+2 for safety/equipment;Ambulation;BSC/3in1;Rolling walker (2 wheels) Toilet Transfer Details (indicate cue type and reason): step pivot from EOB to [facility] with VC forhand placement Toileting- Clothing Manipulation and Hygiene: Maximal assistance;Sit to/from stand Toileting - Clothing Manipulation Details (indicate cue type and reason): CGA-MIN A for standing balance with BUE support on RW and MAXA  from nurse tech for pericare     Functional mobility during ADLs: Minimal assistance;+2 for safety/equipment;Rolling walker (2 wheels);Cueing for sequencing       Vision         Perception         Praxis         Pertinent Vitals/Pain Pain Assessment Pain Assessment: No/denies pain     Extremity/Trunk Assessment Upper Extremity Assessment Upper Extremity Assessment: Generalized weakness   Lower Extremity Assessment Lower Extremity Assessment: Generalized weakness       Communication Communication Communication: No apparent difficulties   Cognition Arousal: Alert Behavior During Therapy: WFL for tasks assessed/performed Overall Cognitive Status: Within Functional Limits for tasks assessed                                       General Comments       Exercises     Shoulder Instructions      Home Living Family/patient expects to be discharged to:: Private residence Living Arrangements: Spouse/significant other Available Help at Discharge: Family;Available 24 hours/day Type of Home: House Home Access: Stairs to enter [employer] of Steps: 3 Entrance Stairs-Rails: Right;Left;Can reach both Home Layout: One level     Bathroom Shower/Tub: Chief Strategy Officer: Standard Bathroom Accessibility: Yes   Home Equipment: Agricultural consultant (2 wheels);BSC/3in1;Cane - single point;Shower seat   Additional  Comments: pt uses bsc next to bed and next to recliner      Prior Functioning/Environment Prior Level of Function : Needs assist;History of Falls (last six months)             Mobility Comments: SPC for short household distances  (was only using SPC PRN ~1x/wk before August hospital admission). Endorses 6 falls in last 73mo. ADLs Comments: Pt requires moderate assist from spouse for toileting, bathing, and dressing and all IADL (before Aug admission, pt was supv for standing bathing, mod indep for grooming, feeding, dressing, toileting, and was managing meds)        OT Problem List: Decreased strength;Decreased activity tolerance;Impaired balance (sitting and/or standing);Decreased knowledge of use of DME or AE;Decreased safety awareness      OT Treatment/Interventions: Self-care/ADL training;Therapeutic exercise;Therapeutic activities;DME and/or AE instruction;Patient/family education;Energy conservation;Balance training    OT Goals(Current goals can be found in the care plan section) Acute Rehab OT Goals Patient Stated Goal: get stronger OT Goal Formulation: With patient Time For Goal Achievement: 10/17/23 Potential to Achieve Goals: Good ADL Goals Pt Will Transfer to Toilet: with supervision;ambulating;bedside commode (LRAD) Pt Will Perform Toileting - Clothing Manipulation and hygiene: with supervision;sitting/lateral leans Additional ADL Goal #1: Pt will  identify at least 1 learned falls prevention strategy to implement upon return home.  OT Frequency: Min 1X/week    Co-evaluation              AM-PAC OT "6 Clicks" Daily Activity     Outcome Measure Help from another person eating meals?: None Help from another person taking care of personal grooming?: A Little Help from another person toileting, which includes using toliet, bedpan, or urinal?: A Lot Help from another person bathing (including washing, rinsing, drying)?: A Lot Help from another person to put on and  taking off regular upper body clothing?: A Little Help from another person to put on and taking off regular lower body clothing?: A Lot 6 Click Score: 16   End of Session Equipment Utilized During Treatment: Rolling walker (2 wheels)  Activity Tolerance: Patient tolerated treatment well Patient left: in bed;with call bell/phone within reach;with bed alarm set  OT Visit Diagnosis: Other abnormalities of gait and mobility (R26.89);Repeated falls (R29.6);Muscle weakness (generalized) (M62.81)                Time: 1610-9604 OT Time Calculation (min): 18 min Charges:  OT General Charges $OT Visit: 1 Visit OT Evaluation $OT Eval Low Complexity: 1 Low OT Treatments $Self Care/Home Management : 8-22 mins  Arman Filter., MPH, MS, OTR/L ascom [phone] 10/03/23, 10:03 AM

## 2023-10-03 NOTE — Consult Note (Signed)
NEURO HOSPITALIST CONSULT NOTE   Requestig physician: Dr. Sherryll Burger  Reason for Consult: Seizure-like episode  History obtained from:  Patient and Chart     HPI:                                                                                                                                          Elizabeth [name] is an 72 y.o. 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 The spell lasted for about 5 minutes, manifesting with twitching of her left forehead and side of her head, which had been preceded by a sensation of left facial tingling. The patient denied any loss of consciousness at any point. No shaking of other parts of the body. The patient states that this has happened before. She was found to be hypotensive at 86/50 in triage.   She has seen a neurologist for her recent stroke and is being worked up for possible seizures   She has a remote history of partial seizure with right facial tingling and muscle twitching about 30 years ago. She took Dilantin for some years but then discontinued it; since then, she has remained seizure-free for the last 30 years. She was recently diagnosed with cardiac amyloidosis and was started on tafamidis meglumine, her first pills being taken on Saturday (review of [facility] website reveals a side effects listing for this medication that does not include seizures).   ED labs showed an elevated creatinine of 4.3 compared to her  baseline of 1.4-1.6 about 1 month ago.   CT head was negative for acute findings.  Past Medical History:  Diagnosis Date   Cancer [facility])    breast   Carcinoma of right breast (HCC) 05/20/2014   Diabetes mellitus without complication (HCC)    Hypertension    Lymphedema    Personal history of radiation therapy 2015   Right breast   PONV (postoperative nausea and vomiting)    Psoriasis 1990   Stroke (HCC) 2000   residual left arm weakness   Varicose veins     Past Surgical History:  Procedure Laterality Date   AMPUTATION Left 03/04/2021   Procedure: AMPUTATION RAY;  Surgeon: Linus Galas, DPM;  Location: [facility] ORS;  Service: Podiatry;  Laterality: Left;   AMPUTATION TOE Right 01/25/2017   Procedure: AMPUTATION TOE   ;  Surgeon: Recardo Evangelist, DPM;  Location: [facility] OR[facility];  Service: Podiatry;  Laterality: Right;   BREAST EXCISIONAL BIOPSY Right 2015   [facility] / with radiation   BREAST LUMPECTOMY Right 04-2014   followed by radiation,  no chemo   BREAST REDUCTION SURGERY  1977   CHOLECYSTECTOMY N/A 09/23/2015   Procedure: LAPAROSCOPIC CHOLECYSTECTOMY;  Surgeon: Geoffry Paradise, MD;  Location: [facility] ORS;  Service: General;  Laterality: N/A;   DILATION AND CURETTAGE OF UTERUS  2010   EYE SURGERY Left 2015   cataracts and left eye detached retina repair   EYE SURGERY Right 2013   cataract with len implant   GASTRIC ROUX-EN-Y N/A 09/23/2015   Procedure: LAPAROSCOPIC ROUX-EN-Y GASTRIC;  Surgeon: Geoffry Paradise, MD;  Location: [facility] OR[facility];  Service: General;  Laterality: N/A;   HIATAL HERNIA REPAIR N/A 09/23/2015   Procedure: LAPAROSCOPIC REPAIR OF HIATAL HERNIA;  Surgeon: Geoffry Paradise, MD;  Location: [facility];  Service: General;  Laterality: N/A;   IRRIGATION AND DEBRIDEMENT FOOT Right 01/25/2017   Procedure: IRRIGATION AND DEBRIDEMENT FOOT;  Surgeon: Recardo Evangelist, DPM;  Location: [facility];  Service: Podiatry;  Laterality: Right;   IRRIGATION AND DEBRIDEMENT FOOT Right 01/29/2017   Procedure: IRRIGATION  AND DEBRIDEMENT FOOT and application of wound vac;  Surgeon: Recardo Evangelist, DPM;  Location: [facility];  Service: Podiatry;  Laterality: Right;   LOWER EXTREMITY ANGIOGRAPHY Right 01/28/2017   Procedure: Lower Extremity Angiography;  Surgeon: Renford Dills, MD;  Location: [facility];  Service: Cardiovascular;  Laterality: Right;   LOWER EXTREMITY ANGIOGRAPHY Left 03/06/2021   Procedure: Lower Extremity Angiography;  Surgeon: Renford Dills, MD;  Location: [facility];  Service: Cardiovascular;  Laterality: Left;   LOWER EXTREMITY INTERVENTION  01/28/2017   Procedure: Lower Extremity Intervention;  Surgeon: Renford Dills, MD;  Location: [facility];  Service: Cardiovascular;;   REDUCTION MAMMAPLASTY      Family History  Problem Relation Age of Onset   Diabetes Mother    Cancer Mother    Breast cancer Mother 35   Diabetes Father    Diabetes Sister    Diabetes Brother              Social History:  reports that she has never smoked. She has never been exposed to tobacco smoke. She has never used smokeless tobacco. She reports that she does not drink alcohol and does not use drugs.  Allergies  Allergen Reactions   Levaquin [Levofloxacin] Other (See Comments)    Partial seizure    MEDICATIONS:                                                                                                                     Prior to Admission:  Medications Prior to Admission  Medication Sig Dispense Refill Last Dose   acetaminophen (TYLENOL) 325 MG tablet Take 2 tablets (650 mg total) by mouth every 6 (six) hours as needed for mild pain (or Fever >/=  101).   prn   apixaban (ELIQUIS) 5 MG TABS tablet Take 1 tablet (5 mg total) by mouth 2 (two) times daily. 180 tablet 3 10/01/2023 at 1800   Calcium Carb-Cholecalciferol (CALCIUM 1000 + D PO) Take 1 tablet by mouth daily.   10/01/2023   furosemide (LASIX) 40 MG tablet Take 1 tablet (40 mg total) by mouth 2 (two) times  daily. 90 tablet 3 10/01/2023   gabapentin (NEURONTIN) 300 MG capsule Take 300 mg by mouth daily.   10/01/2023   levofloxacin (LEVAQUIN) 250 MG tablet Take 250 mg by mouth daily.   09/30/2023   linagliptin (TRADJENTA) 5 MG TABS tablet Take 5 mg by mouth daily.   10/01/2023   lisinopril (ZESTRIL) 10 MG tablet Take 1 tablet (10 mg total) by mouth daily. 90 tablet 3 10/01/2023   potassium chloride (KLOR-CON) 10 MEQ tablet Take 2 tablets (20 mEq total) by mouth daily. 180 tablet 3 10/01/2023   pravastatin (PRAVACHOL) 40 MG tablet Take 40 mg by mouth daily. In afternoon   10/01/2023   spironolactone (ALDACTONE) 25 MG tablet Take 1 tablet (25 mg total) by mouth daily. 30 tablet 11 10/01/2023   Tafamidis Meglumine, Cardiac, 20 MG CAPS Take 4 capsules (80 mg total) by mouth daily. 120 capsule 11 09/30/2023   apixaban (ELIQUIS) 5 MG TABS tablet Take 1 tablet (5 mg total) by mouth 2 (two) times daily. 28 tablet 0    furosemide (LASIX) 40 MG tablet Take 1 tablet (40 mg total) by mouth daily. 90 tablet 3    TRUE METRIX BLOOD GLUCOSE TEST test strip       TRUEPLUS LANCETS 33G MISC       Scheduled:  apixaban  5 mg Oral BID   doxycycline  100 mg Oral Q12H   gabapentin  300 mg Oral Daily   insulin aspart  0-9 Units Subcutaneous TID WC   mouth rinse  15 mL Mouth Rinse Q2H   pravastatin  40 mg Oral Daily   sodium bicarbonate  650 mg Oral BID   sodium chloride flush  10 mL Intravenous Q12H   Tafamidis Meglumine (Cardiac)  40 mg Oral Daily      ROS:                                                                                                                                       No lightheadedness, new vision changes, CP or SOB.    Blood pressure (!) 85/50, pulse (!) 53, temperature 97.9 F (36.6 C), temperature source Oral, resp. rate 19, height 5\' 3"  (1.6 m), weight 88 kg, SpO2 100%.   General Examination:  Physical Exam HEENT- [place]/AT   Lungs- Respirations unlabored Extremities- ACE bandages to lower legs bilaterally.   Neurological Examination Mental Status: Awake and alert. Fully oriented. Thought content appropriate.  Speech fluent without evidence of aphasia.  Able to follow all commands without difficulty. Cranial Nerves: II: Visual fields intact in right eye with no extinction to DSS. Chronically blind in left eye.  III,IV, VI: Ptosis present on the left (chronic). EOMI OD. No nystagmus. V: Temp sensation equal bilaterally VII: Smile symmetric VIII: Hearing intact to voice IX,X: No hypophonia or hoarseness XI: Symmetric XII: Midline tongue extension Motor: RUE: 5/5 LUE: 5/5 RLE: 5/5 LLE: 5/5 Sensory: Temp and FT intact x 4. No extinction to DSS. Deep Tendon Reflexes: 2+ and symmetric bilateral biceps, brachioradialis and patellae Cerebellar: No ataxia with FNF bilaterally Gait: Deferred Other: No jerking, twitching, eyelid fluttering, gaze deviation, facial automatisms, speech arrest or other seizure like activity noted.     Lab Results: Basic Metabolic Panel: Recent Labs  Lab 10/02/23 1327 10/03/23 0626  NA 137 138  K 4.5 4.1  CL 109 110  CO2 16* 19*  GLUCOSE 89 80  BUN 54* 49*  CREATININE 4.31* 3.70*  CALCIUM 8.4* 7.7*    CBC: Recent Labs  Lab 10/02/23 1327  WBC 11.3*  HGB 12.9  HCT 38.9  MCV 87.6  PLT 180    Cardiac Enzymes: No results for input(s): "CKTOTAL", "CKMB", "CKMBINDEX", "TROPONINI" in the last 168 hours.  Lipid Panel: No results for input(s): "CHOL", "TRIG", "HDL", "CHOLHDL", "VLDL", "LDLCALC" in the last 168 hours.  Imaging: CT HEAD WO CONTRAST ( )  Result Date: 10/02/2023 CLINICAL DATA:  History EXAM: CT HEAD WITHOUT CONTRAST TECHNIQUE: Contiguous axial images were obtained from the base of the skull through the vertex without intravenous contrast. RADIATION DOSE REDUCTION: This exam was performed according to the  departmental dose-optimization program which includes automated exposure control, adjustment of the mA and/or kV according to patient size and/or use of iterative reconstruction technique. COMPARISON:  07/02/23 CT head FINDINGS: Brain: No hemorrhage. No hydrocephalus. No extra-axial fluid collection. No CT evidence of an acute cortical infarct. No mass effect. No mass lesion. There is chronic infarcts right frontal lobe and basal ganglia. There is also a chronic infarct in the right occipital lobe Vascular: No hyperdense vessel or unexpected calcification. Skull: Normal. Negative for fracture or focal lesion. Sinuses/Orbits: Middle ear or mastoid effusion. Paranasal sinuses are clear. Right lens replacement. 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 Other: None. IMPRESSION: No hemorrhage or CT evidence of an acute cortical infarct. Electronically Signed   By: Lorenza Cambridge M.D.   On: 10/02/2023 15:29    Recent MRI (07/03/23): Small acute infarct in the left cerebellum. Redemonstrated encephalomalacia in the right anterior MCA territory, consistent with remote infarct. Wallerian degeneration is noted extending into the right corticospinal tracts. Additional remote infarct right occipital lobe. Small lacunar infarcts in the bilateral cerebellar hemispheres, thalami, basal ganglia. T2 hyperintense signal in the periventricular white matter and pons, likely the sequela of chronic small vessel ischemic disease.  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 The spell lasted for about 5 minutes, manifesting with twitching of her left forehead and side of her head, which had been preceded by a sensation of left facial tingling. The patient denied any loss of consciousness at any point. No shaking of other parts of the body. The patient states that this has happened before. She was found to be hypotensive at 86/50 in triage.  - Exam reveals chronic left eye blindness with ptosis. No seizure-like activity noted.  - CT head: Chronic infarcts right frontal lobe and basal ganglia. There is also a chronic infarct in the right occipital lobe. No acute abnormality.  - EEG: Continuous slow, right hemisphere, maximal right frontal region. This study is suggestive of cortical dysfunction arising from right hemisphere, maximal right frontal region likely secondary to underlying stroke. No seizures or epileptiform discharges were seen throughout the recording. - Overall impression: Probable breakthrough seizure based on semiology as described in HPI, her prior history of seizures and old right MCA stroke seen on MRI which may serve as a seizure onset zone.   - LFTs are elevated.  - Na normal. Ca low at 7.6 in the context of low albumin of 3.2.  - VPA and Dilantin are not optimal AEDs currently based on elevated transaminases. Has QT prolongation and Keppra is also a not an optimal choice. Vimpat also with potential cardiac side effect of increased PR interval and in the context of her cardiac amyloidosis this medication will also be avoided. Carbamazepine and Lamictal can also result in cardiac side effects.  - Onfi (clobazam) is a reasonable choice for this patient. No dose adjustment is required for patients with mild and moderate renal impairment. However, there is no experience with Onfi in patients with severe renal impairment    Recommendations: - Starting clobazam (Onfi) at 5 mg BID. No dose adjustment is  required for patients with mild and moderate renal impairment. There is no experience with ONFI in patients with severe renal impairment. Monitor for possible development of sedation as a side effect.  - Mg level (ordered) - Management of her renal and hepatic dysfunction per primary team.    10/03/2023, 10:09 AM

## 2023-10-03 NOTE — Progress Notes (Signed)
1      PROGRESS NOTE    Elizabeth [name]  HKV:[id number] DOB: 1951-06-05 DOA: 10/02/2023 PCP: Elizabeth Sato, MD    Brief Narrative:   72 y.o. female with medical history significant of remote history of partial seizure of Dilantin, recently diagnosed with cardiac amyloidosis, HTN, HLD, chronic A-fib on Eliquis, chronic lymphedema, IIDM, CKD stage IIIb, diabetic neuropathy, presented with worsening of generalized weakness and new onset of partial seizure   11/25: Neurology, cardiology and PCCM consult.  Transfer to stepdown due to persistent hypotension   Assessment & Plan:   Principal Problem:   Seizure (HCC) Active Problems:   AKI (acute kidney injury) (HCC)   Partial seizure (HCC)   Cardiac amyloidosis (HCC)  Partial seizure -Clinically suspect this is related to recent use of Levaquin which lowered seizure threshold as patient has remote history of partial seizure 30 years ago.  On top of that patient has been having AKI process, as a result there might be a toxic level of Levaquin buildup lower seizure threshold. -EEG shows cortical dysfunction arising from right hemisphere, maximal right frontal region likely secondary to underlying stroke.  No seizure or epileptiform discharges were seen. -Discontinued Levaquin, change to doxycycline for 2 more days -neurology consult-Dr. Otelia [name] will see   AKI on CKD stage IIIb -Prerenal, clinically volume contracted with hypotension, responded to IV fluid -Hold off diuresis, hold off spironolactone and ACEI Lab Results  Component Value Date   CREATININE 3.31 (H) 10/03/2023   CREATININE 3.70 (H) 10/03/2023   CREATININE 4.31 (H) 10/02/2023   -Avoid nephrotoxic medications.  Monitor renal function.  If any worsening, consider nephrology consult   Deconditioning -PT, OT evaluation   Recently diagnosed cardiac amyloidosis Chronic Diastolic CHF Persistent Hypotension -Continue Tafamadis Advance CHF team c/sed Echo (08/24) - EF  55-60% Hold Diuretics Cardio would like to hold off further IVF and transfer to SD/PCU. She may need pressors if BP continues to stay low   Chronic A-fib -Controlled, continue Eliquis   DM2 -SSI for now   Abnormal UA -Negative urine protein, low suspicion for renal amyloidosis at this point -Leukocytes in urine but negative for nitrite and presence of epithelial cells indicating contamination.  Patient has no symptoms UTI, monitor off antibiotics.   Chronic lymphedema with chronic ultrasound bilateral lower extremities -Outpatient follow-up with podiatry and wound care     DVT prophylaxis: Eliquis  apixaban (ELIQUIS) tablet 5 mg     Code Status: (Full Code Family Communication: ( NO "discussed with patient") Disposition Plan: Possible D/C in 4-4 days depending on clinical condition and cardio eval   Consultants:  Neuro Cardio PCCM   Antimicrobials:  Doxy    Subjective:  Sitting in the chair. Feeling weak  Objective: Vitals:   10/03/23 0034 10/03/23 0421 10/03/23 0859 10/03/23 1411  BP: (!) 94/55 (!) 104/50 (!) 85/50 (!) 88/55  Pulse: 65 100 (!) 53   Resp: 18 18 19    Temp: 98 F (36.7 C) 98 F (36.7 C) 97.9 F (36.6 C)   TempSrc:   Oral   SpO2: 99% 98% 100%   Weight:      Height:        Intake/Output Summary (Last 24 hours) at 10/03/2023 1541 Last data filed at 10/03/2023 1100 Gross per 24 hour  Intake 1130 ml  Output --  Net 1130 ml   Filed Weights   10/02/23 1318  Weight: 88 kg    Examination:  [name] exam: Appears calm and comfortable  Respiratory system: Clear to auscultation. Respiratory effort normal. Cardiovascular system: S1 & S2 heard, RRR. No JVD, murmurs, rubs, gallops or clicks. No pedal edema. Gastrointestinal system: Abdomen is nondistended, soft and nontender. No organomegaly or masses felt. Normal bowel sounds heard. Central nervous system: Alert and oriented. No focal neurological deficits. Extremities: Symmetric 5 x 5  power. Skin: No rashes, lesions or ulcers Psychiatry: Judgement and insight appear normal. Mood & affect appropriate.     Data Reviewed: I have personally reviewed following labs and imaging studies  CBC: Recent Labs  Lab 10/02/23 1327  WBC 11.3*  HGB 12.9  HCT 38.9  MCV 87.6  PLT 180   Basic Metabolic Panel: Recent Labs  Lab 10/02/23 1327 10/23/2023 0626 23-Oct-2023 1432  NA 137 138 135  K 4.5 4.1 3.9  CL 109 110 112*  CO2 16* 19* 16*  GLUCOSE 89 80 144*  BUN 54* 49* 47*  CREATININE 4.31* 3.70* 3.31*  CALCIUM 8.4* 7.7* 7.6*   GFR: Estimated Creatinine Clearance: 16.4 mL/min (A) (by C-G formula based on SCr of 3.31 mg/dL (H)). Liver Function Tests: Recent Labs  Lab 10/02/23 1327  AST 67*  ALT 84*  ALKPHOS 94  BILITOT 0.8  PROT 6.4*  ALBUMIN 3.2*    CBG: Recent Labs  Lab 10/02/23 1701 10/02/23 2059 10/23/23 0805 10-23-2023 0914 10/23/2023 1142  GLUCAP 73 91 66* 177* 85   Radiology Studies: EEG adult  Result Date: Oct 23, 2023 Elizabeth Quest, MD     2023-10-23 11:37 AM Patient Name: Elizabeth [name] MRN: [id number] Epilepsy Attending: Charlsie [name] Referring Physician/Provider: Emeline General, MD Date: October 23, 2023 Duration: 27.47 mins Patient history: 72 yo F with remote history of partial seizure with right facial tingling and muscle twitching. EEG to evaluate for seizure Level of alertness: Awake, asleep AEDs during EEG study: GBP Technical aspects: This EEG study was done with scalp electrodes positioned according to the 10-20 International system of electrode placement. Electrical activity was reviewed with band pass filter of 1-70Hz , sensitivity of 7 uV/mm, display speed of 31mm/sec with a 60Hz  notched filter applied as appropriate. EEG data were recorded continuously and digitally stored.  Video monitoring was available and reviewed as appropriate. Description: The posterior dominant rhythm consists of 80 Hz activity of moderate voltage (25-35 uV) seen  predominantly in posterior head regions, symmetric and reactive to eye opening and eye closing. Sleep was characterized by vertex waves, sleep spindles (12 to 14 Hz), maximal frontocentral region. EEG showed continuous 3 to 6 Hz theta-delta slowing in right hemisphere, maximal right frontal region. Hyperventilation and photic stimulation were not performed.   ABNORMALITY - Continuous slow, right hemisphere, maximal right frontal region IMPRESSION: This study is suggestive of cortical dysfunction arising from right hemisphere, maximal right frontal region likely secondary to underlying stroke. No seizures or epileptiform discharges were seen throughout the recording. Please note lack of epileptiform activity during interictal EEG does not exclude the diagnosis of epilepsy. Priyanka Annabelle Harman   CT HEAD WO CONTRAST ( )  Result Date: 10/02/2023 CLINICAL DATA:  History EXAM: CT HEAD WITHOUT CONTRAST TECHNIQUE: Contiguous axial images were obtained from the base of the skull through the vertex without intravenous contrast. RADIATION DOSE REDUCTION: This exam was performed according to the departmental dose-optimization program which includes automated exposure control, adjustment of the mA and/or kV according to patient size and/or use of iterative reconstruction technique. COMPARISON:  07/02/23 CT head FINDINGS: Brain: No hemorrhage. No hydrocephalus. No extra-axial fluid collection. No CT evidence  of an acute cortical infarct. No mass effect. No mass lesion. There is chronic infarcts right frontal lobe and basal ganglia. There is also a chronic infarct in the right occipital lobe Vascular: No hyperdense vessel or unexpected calcification. Skull: Normal. Negative for fracture or focal lesion. Sinuses/Orbits: Middle ear or mastoid effusion. Paranasal sinuses are clear. Right lens replacement. 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 Other: None. IMPRESSION: No hemorrhage or CT evidence of an acute cortical infarct. Electronically  Signed   By: Lorenza Cambridge M.D.   On: 10/02/2023 15:29        Scheduled Meds:  apixaban  5 mg Oral BID   doxycycline  100 mg Oral Q12H   gabapentin  300 mg Oral Daily   insulin aspart  0-9 Units Subcutaneous TID WC   pravastatin  40 mg Oral Daily   sodium bicarbonate  650 mg Oral BID   sodium chloride flush  10 mL Intravenous Q12H   Tafamidis Meglumine (Cardiac)  40 mg Oral Daily   Continuous Infusions:   LOS: 0 days    Time spent: 35 mins    Reanne Nellums Sherryll Burger, MD [facility] Pager [phone]  If 7PM-7AM, please contact night-coverage [email or web address] Password [email or web address] 10/03/2023, 3:41 PM

## 2023-10-03 NOTE — Progress Notes (Signed)
Attempted to call Report to ICU, phone number given to secretary.

## 2023-10-03 NOTE — Consult Note (Addendum)
Advanced Heart Failure Team Consult Note   Primary Physician: Leanna Sato, MD PCP-Cardiologist:  Debbe Odea, MD  Reason for Consultation: TTR amyloid / chronic HFpEF  HPI:    Elizabeth [name] is seen today for evaluation of TTR amyloid / HFpEF at the request of Dr. Sherryll Burger, hospital medicine.   Elizabeth [name] is a 72 y.o. 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   First seen by cardiology in 07/2021 after having been admitted to the hospital in 04/2021 for TIA. Echo during that admission showed an EF of 60 to 65%, no regional wall motion abnormalities, normal LV diastolic function parameters, normal RV systolic function and ventricular cavity size, mild mitral regurgitation, and negative bubble study. Carotid artery ultrasound showed right greater than left carotid bifurcation atherosclerosis with less than 50% stenosis. Subsequent outpatient cardiac monitoring showed a predominant rhythm of sinus with first-degree AV block, second-degree AV block type I, 2 pauses with the longest being 3.4 seconds, rare atrial and ventricular ectopy, and no evidence of A-fib or flutter.    She was admitted 06/2023 for a syncopal episode, was found to be in slow atrial fibrillation. No clear etiology for her stroke was identified. She was seen by EP with future plan for cardioversion and PPM placement, but she was found to be in heart failure so was referred to HF clinic.   AHF follow up work up was started for amyloid. Work up with positive PYP scan. She was recently started on tafamidis. Diuretics were increased for volume overload.   She presented to the ED yesterday with seizure like  activity. Of note she has chronic BLE lymphedema that has been followed with podiatry. Patient had been started on Levaquin which can lower seizure threshold. She described event as some facial twitching and face tingling that lasted around 5 minutes. Denies CP/SOB. Neurology has been consulted. Admission labs reviewed: CT head (-), SCr up to 4.31, K 4.5, AST/ALT mildly elevated, BNP 116, HsTrop 10. EEG showed no seizures.   Resting comfortably in chair. Has been unable to get around much 2/2 weakness and leg wounds. Does not report dizziness. No further seizure like activity. Team concerned about hypotension with SBP in 80s. Patient asymptomatic.    Home Medications Prior to Admission medications   Medication Sig Start Date End Date Taking? Authorizing Provider  acetaminophen (TYLENOL) 325 MG tablet Take 2 tablets (650 mg total) by mouth every 6 (six) hours as needed for mild pain (or Fever >/= 101). 03/07/21  Yes Pennie Banter, DO  apixaban (ELIQUIS) 5 MG TABS tablet Take 1 tablet (5 mg total) by mouth 2 (two) times daily. 07/21/23  Yes Hammock, Sheri, NP  Calcium Carb-Cholecalciferol (CALCIUM 1000 + D PO) Take 1 tablet by mouth daily.   Yes [provider]  furosemide (LASIX) 40 MG tablet Take 1 tablet (40 mg total) by mouth 2 (two) times daily. 09/23/23 12/22/23 Yes Romie Minus, MD  gabapentin (NEURONTIN) 300 MG capsule Take 300 mg by mouth daily. 06/14/23  Yes [provider]  levofloxacin (LEVAQUIN) 250 MG tablet Take 250 mg by mouth daily.   Yes [provider]  linagliptin (TRADJENTA) 5 MG TABS tablet Take  5 mg by mouth daily.   Yes [provider]  lisinopril (ZESTRIL) 10 MG tablet Take 1 tablet (10 mg total) by mouth daily. 09/23/23 12/22/23 Yes Romie Minus, MD  potassium chloride (KLOR-CON) 10 MEQ tablet Take 2 tablets (20 mEq total) by mouth daily. 09/13/23  Yes Hammock, Lavonna Rua, NP  pravastatin (PRAVACHOL) 40 MG tablet Take 40 mg by mouth  daily. In afternoon   Yes [provider]  spironolactone (ALDACTONE) 25 MG tablet Take 1 tablet (25 mg total) by mouth daily. 08/26/23  Yes Romie Minus, MD  Tafamidis Meglumine, Cardiac, 20 MG CAPS Take 4 capsules (80 mg total) by mouth daily. 09/23/23  Yes Romie Minus, MD  apixaban (ELIQUIS) 5 MG TABS tablet Take 1 tablet (5 mg total) by mouth 2 (two) times daily. 07/21/23   Elizabeth Quest, NP  furosemide (LASIX) 40 MG tablet Take 1 tablet (40 mg total) by mouth daily. 08/24/23   Lanier Prude, MD  TRUE METRIX BLOOD GLUCOSE TEST test strip  10/13/18   [provider]  TRUEPLUS LANCETS 33G MISC  10/13/18   [provider]    Past Medical History: Past Medical History:  Diagnosis Date   Cancer (HCC)    breast   Carcinoma of right breast (HCC) 05/20/2014   Diabetes mellitus without complication (HCC)    Hypertension    Lymphedema    Personal history of radiation therapy 2015   Right breast   PONV (postoperative nausea and vomiting)    Psoriasis 1990   Stroke (HCC) 2000   residual left arm weakness   Varicose veins     Past Surgical History: Past Surgical History:  Procedure Laterality Date   AMPUTATION Left 03/04/2021   Procedure: AMPUTATION RAY;  Surgeon: Linus Galas, DPM;  Location: [facility];  Service: Podiatry;  Laterality: Left;   AMPUTATION TOE Right 01/25/2017   Procedure: AMPUTATION TOE   ;  Surgeon: Recardo Evangelist, DPM;  Location: [facility];  Service: Podiatry;  Laterality: Right;   BREAST EXCISIONAL BIOPSY Right 2015   [facility] / with radiation   BREAST LUMPECTOMY Right 04-2014   followed by radiation,  no chemo   BREAST REDUCTION SURGERY  1977   CHOLECYSTECTOMY N/A 09/23/2015   Procedure: LAPAROSCOPIC CHOLECYSTECTOMY;  Surgeon: Geoffry Paradise, MD;  Location: [facility];  Service: General;  Laterality: N/A;   DILATION AND CURETTAGE OF UTERUS  2010   EYE SURGERY Left 2015   cataracts and left eye detached retina repair   EYE SURGERY  Right 2013   cataract with len implant   GASTRIC ROUX-EN-Y N/A 09/23/2015   Procedure: LAPAROSCOPIC ROUX-EN-Y GASTRIC;  Surgeon: Geoffry Paradise, MD;  Location: [facility];  Service: General;  Laterality: N/A;   HIATAL HERNIA REPAIR N/A 09/23/2015   Procedure: LAPAROSCOPIC REPAIR OF HIATAL HERNIA;  Surgeon: Geoffry Paradise, MD;  Location: [facility];  Service: General;  Laterality: N/A;   IRRIGATION AND DEBRIDEMENT FOOT Right 01/25/2017   Procedure: IRRIGATION AND DEBRIDEMENT FOOT;  Surgeon: Recardo Evangelist, DPM;  Location: [facility];  Service: Podiatry;  Laterality: Right;   IRRIGATION AND DEBRIDEMENT FOOT Right 01/29/2017   Procedure: IRRIGATION AND DEBRIDEMENT FOOT and application of wound vac;  Surgeon: Recardo Evangelist, DPM;  Location: [facility];  Service: Podiatry;  Laterality: Right;   LOWER EXTREMITY ANGIOGRAPHY Right 01/28/2017   Procedure: Lower Extremity Angiography;  Surgeon: Renford Dills, MD;  Location: [facility];  Service: Cardiovascular;  Laterality: Right;  LOWER EXTREMITY ANGIOGRAPHY Left 03/06/2021   Procedure: Lower Extremity Angiography;  Surgeon: Renford Dills, MD;  Location: [facility];  Service: Cardiovascular;  Laterality: Left;   LOWER EXTREMITY INTERVENTION  01/28/2017   Procedure: Lower Extremity Intervention;  Surgeon: Renford Dills, MD;  Location: [facility];  Service: Cardiovascular;;   REDUCTION MAMMAPLASTY      Family History: Family History  Problem Relation Age of Onset   Diabetes Mother    Cancer Mother    Breast cancer Mother 67   Diabetes Father    Diabetes Sister    Diabetes Brother     Social History: Social History   Socioeconomic History   Marital status: Married    Spouse name: [name]   Number of children: Not on file   Years of education: Not on file   Highest education level: Not on file  Occupational History   Not on file  Tobacco Use   Smoking status: Never    Passive exposure: Never    Smokeless tobacco: Never  Vaping Use   Vaping status: Never Used  Substance and Sexual Activity   Alcohol use: No    Alcohol/week: 0.0 standard drinks of alcohol   Drug use: No   Sexual activity: Yes    Birth control/protection: Post-menopausal  Other Topics Concern   Not on file  Social History Narrative   Not on file   Social Determinants of Health   Financial Resource Strain: Not on file  Food Insecurity: No Food Insecurity (10/03/2023)   Hunger Vital Sign    Worried About Running Out of Food in the Last Year: Never true    Ran Out of Food in the Last Year: Never true  Transportation Needs: No Transportation Needs (10/03/2023)   PRAPARE - Administrator, Civil Service (Medical): No    Lack of Transportation (Non-Medical): No  Physical Activity: Not on file  Stress: Not on file  Social Connections: Not on file    Allergies:  Allergies  Allergen Reactions   Levaquin [Levofloxacin] Other (See Comments)    Partial seizure    Objective:    Vital Signs:   Temp:  [97.9 F (36.6 C)-98.8 F (37.1 C)] 97.9 F (36.6 C) (11/25 0859) Pulse Rate:  [53-100] 53 (11/25 0859) Resp:  [13-25] 19 (11/25 0859) BP: (85-114)/(50-91) 85/50 (11/25 0859) SpO2:  [96 %-100 %] 100 % (11/25 0859) Last BM Date : 10/03/23  Weight change: Filed Weights   10/02/23 1318  Weight: 88 kg    Intake/Output:   Intake/Output Summary (Last 24 hours) at 10/03/2023 1345 Last data filed at 10/03/2023 1100 Gross per 24 hour  Intake 1130 ml  Output --  Net 1130 ml      Physical Exam    General:  elderly appearing.  No respiratory difficulty HEENT: normal Neck: supple. JVD appears flat. Carotids 2+ bilat; no bruits. No lymphadenopathy or thyromegaly appreciated. Cor: PMI nondisplaced. Regular rate & irregular rhythm. No rubs, gallops or murmurs. Lungs: clear, diminished bases Abdomen: soft, nontender, nondistended. No hepatosplenomegaly. No bruits or masses. Good bowel  sounds. Extremities: no cyanosis, clubbing, rash, edema. Bilateral lower extremities wrapped.  Neuro: alert & oriented x 3, cranial nerves grossly intact. moves all 4 extremities w/o difficulty. Affect pleasant.   Telemetry   Not on tele at the moment but from tele review she was in flutter at 60  EKG    A fib 70s   Labs   Basic  Metabolic Panel: Recent Labs  Lab 10/02/23 1327 10/03/23 0626  NA 137 138  K 4.5 4.1  CL 109 110  CO2 16* 19*  GLUCOSE 89 80  BUN 54* 49*  CREATININE 4.31* 3.70*  CALCIUM 8.4* 7.7*    Liver Function Tests: Recent Labs  Lab 10/02/23 1327  AST 67*  ALT 84*  ALKPHOS 94  BILITOT 0.8  PROT 6.4*  ALBUMIN 3.2*   No results for input(s): "LIPASE", "AMYLASE" in the last 168 hours. No results for input(s): "AMMONIA" in the last 168 hours.  CBC: Recent Labs  Lab 10/02/23 1327  WBC 11.3*  HGB 12.9  HCT 38.9  MCV 87.6  PLT 180    Cardiac Enzymes: No results for input(s): "CKTOTAL", "CKMB", "CKMBINDEX", "TROPONINI" in the last 168 hours.  BNP: BNP (last 3 results) Recent Labs    07/02/23 2016 10/02/23 1327  BNP 670.1* 116.7*    ProBNP (last 3 results) No results for input(s): "PROBNP" in the last 8760 hours.   CBG: Recent Labs  Lab 10/02/23 1701 10/02/23 2059 10/03/23 0805 10/03/23 0914 10/03/23 1142  GLUCAP 73 91 66* 177* 85    Coagulation Studies: No results for input(s): "LABPROT", "INR" in the last 72 hours.   Imaging   EEG adult  Result Date: 10/03/2023 Elizabeth Quest, MD     10/03/2023 11:37 AM Patient Name: Elizabeth [name] MRN: [id number] Epilepsy Attending: Charlsie Quest Referring Physician/Provider: Emeline General, MD Date: 10/03/2023 Duration: 27.47 mins Patient history: 72 yo F with remote history of partial seizure with right facial tingling and muscle twitching. EEG to evaluate for seizure Level of alertness: Awake, asleep AEDs during EEG study: GBP Technical aspects: This EEG study was done with  scalp electrodes positioned according to the 10-20 International system of electrode placement. Electrical activity was reviewed with band pass filter of 1-70Hz , sensitivity of 7 uV/mm, display speed of 22mm/sec with a 60Hz  notched filter applied as appropriate. EEG data were recorded continuously and digitally stored.  Video monitoring was available and reviewed as appropriate. Description: The posterior dominant rhythm consists of 80 Hz activity of moderate voltage (25-35 uV) seen predominantly in posterior head regions, symmetric and reactive to eye opening and eye closing. Sleep was characterized by vertex waves, sleep spindles (12 to 14 Hz), maximal frontocentral region. EEG showed continuous 3 to 6 Hz theta-delta slowing in right hemisphere, maximal right frontal region. Hyperventilation and photic stimulation were not performed.   ABNORMALITY - Continuous slow, right hemisphere, maximal right frontal region IMPRESSION: This study is suggestive of cortical dysfunction arising from right hemisphere, maximal right frontal region likely secondary to underlying stroke. No seizures or epileptiform discharges were seen throughout the recording. Please note lack of epileptiform activity during interictal EEG does not exclude the diagnosis of epilepsy. Priyanka Annabelle Harman   CT HEAD WO CONTRAST ( )  Result Date: 10/02/2023 CLINICAL DATA:  History EXAM: CT HEAD WITHOUT CONTRAST TECHNIQUE: Contiguous axial images were obtained from the base of the skull through the vertex without intravenous contrast. RADIATION DOSE REDUCTION: This exam was performed according to the departmental dose-optimization program which includes automated exposure control, adjustment of the mA and/or kV according to patient size and/or use of iterative reconstruction technique. COMPARISON:  07/02/23 CT head FINDINGS: Brain: No hemorrhage. No hydrocephalus. No extra-axial fluid collection. No CT evidence of an acute cortical infarct. No mass  effect. No mass lesion. There is chronic infarcts right frontal lobe and basal ganglia. There is also a  chronic infarct in the right occipital lobe Vascular: No hyperdense vessel or unexpected calcification. Skull: Normal. Negative for fracture or focal lesion. Sinuses/Orbits: Middle ear or mastoid effusion. Paranasal sinuses are clear. Right lens replacement. 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 Other: None. IMPRESSION: No hemorrhage or CT evidence of an acute cortical infarct. Electronically Signed   By: Lorenza Cambridge M.D.   On: 10/02/2023 15:29     Medications:   Current Medications:  apixaban  5 mg Oral BID   doxycycline  100 mg Oral Q12H   gabapentin  300 mg Oral Daily   insulin aspart  0-9 Units Subcutaneous TID WC   pravastatin  40 mg Oral Daily   sodium bicarbonate  650 mg Oral BID   sodium chloride flush  10 mL Intravenous Q12H   Tafamidis Meglumine (Cardiac)  40 mg Oral Daily    Infusions:   Patient Profile   Elizabeth [name] is a 72 y.o. 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 AHF team to see for cardiac amyloid.   Assessment/Plan   1. Cardiac amyloidosis: 8/24 Echo EF 55-60%, RV normal with biatrial enlargement, low voltage on EKG, PYP read as positive.  - Continue tafamadis,  PharmD following - TTR genetics panel sent - Consider OP administration of vutrisiran pending results given neuropathy and recent HELIOS-B trial   2. Chronic diastolic heart failure - 8/24 Echo EF 55-60%, RV normal with biatrial enlargement - Will check a ReDs, volume difficult to gauge.  - holding diuretics for now - GDMT limited by AKI and hypotension  3. Atrial fibrillation: Slow rate, likely  will need pacemaker in the future. However, understandably holding off with healing lower extremity wounds.  - Continue Eliquis 5 mg BID  4. AKI on CKD IIIb - SCr up to 4.31 on admission. Suspect 2/2 overdiuresis - baseline appears to be around 1.5 - avoid hyoptension - Holding diuretics and GDMT  5. Seizures - Neurology following - suspect 2/2 levaquin  With soft BP plan to transfer to PCU/SDU.   Length of Stay: 0  Alen Bleacher, NP  10/03/2023, 1:45 PM  Advanced Heart Failure Team Pager [phone] (M-F; 7a - 5p)  Please contact [facility] for night-coverage after hours (4p -7a ) and weekends on [email or web address]   Patient seen with NP, agree with the above note.   Patient with diastolic CHF and suspect cardiac amyloidosis (ATTR), on tafamidis at home. Last echo in 8/24 with EF 55-60%, normal RV.  She was admitted with suspected seizure after starting levofloxacin for chronic leg wounds. Creatinine up from 1.28 baseline to 4.3 at admission and 3.3 today.  Of note, she recently increased her Lasix as outpatient to 40 mg bid.  SBP soft today, running 80s-100s.  She denies lightheadedness.  Legs are wrapped, she is on doxycycline now for ?infection of venous stasis ulcerations.   General: NAD Neck: No JVD, no thyromegaly or thyroid nodule.  Lungs: Clear to auscultation bilaterally with normal respiratory effort. CV: Nondisplaced PMI.  Heart irregular S1/S2, no S3/S4, no murmur.  1+ edema to knees (legs wrapped).  No carotid bruit.  Unable to palpate pedal pulses with lower legs wrapped.  Abdomen: Soft, nontender, no hepatosplenomegaly, no distention.  Skin: Intact without lesions or rashes.  Neurologic: Alert and oriented x 3.  Psych: Normal affect. Extremities: No clubbing or cyanosis. Per report, has venous stasis ulcerations but legs are wrapped.  HEENT: Normal.   1. Chronic diastolic CHF:  Last echo in 8/24 with EF 55-60%, normal RV.  She had a PYP scan in 10/24 read as grade 2,  suggestive of cardiac amyloidosis though H/CL not markedly elevated at 1.32.  She was started on tafamidis. Recently had Lasix increased, now with AKI.  On exam, no JVD.  REDS clip only 32%.  I do not think she is significantly volume overloaded.  I think she has lymphedema which makes assessment of volume difficult.   - Agree with no diuretic for now.  - With soft BP (?component of autonomic neuropathy) and elevated creatinine, would start midodrine 5 mg tid.  - Continue tafamidis for now, consider eventual cardiac MRI to firm up diagnosis of cardiac amyloidosis, I probably would have read the PYP scan as equivocal.  - Would consider eventual Cardiomems placement to help with volume management as exam is difficult.  2. AKI on CKD stage 3: Baseline creatinine 1.28, up to 4.3 at admission.  Suspect overdiuresis.  - As above, hold diuretic.  3. Hypotension: SBP ranging 80s-100s.  Suspect she runs low at baseline.  She is asymptomatic.  - Avoid IV fluid but hold her diuretics.  - Would avoid pressors.  - Start midodrine as above.  4. Atrial fibrillation: Rate currently stable in 60s.  Chronic.  Suspect component of sick sinus syndrome with bradycardia though has been stable in hospital.  - Continue apixaban.  - No nodal blockade with sick sinus syndrome.  - May eventually need PPM.  5. Seizure: h/o prior seizures.  Suspect threshold for seizures lowered with levofloxacin use + AKI.   - Neurology to evaluate.  6. Wound infection: Venous stasis ulcerations with ?infection.  - Levofloxacin stopped, doxycycline started.    Marca Ancona 10/03/2023 5:02 PM

## 2023-10-03 NOTE — Procedures (Signed)
Patient Name: Elizabeth [name]  MRN: [id number]  Epilepsy Attending: Charlsie Quest  Referring Physician/Provider: Emeline General, MD  Date: 10/03/2023 Duration: 27.47 mins  Patient history: 72 yo F with remote history of partial seizure with right facial tingling and muscle twitching. EEG to evaluate for seizure  Level of alertness: Awake, asleep  AEDs during EEG study: GBP  Technical aspects: This EEG study was done with scalp electrodes positioned according to the 10-20 International system of electrode placement. Electrical activity was reviewed with band pass filter of 1-70Hz , sensitivity of 7 uV/mm, display speed of 91mm/sec with a 60Hz  notched filter applied as appropriate. EEG data were recorded continuously and digitally stored.  Video monitoring was available and reviewed as appropriate.  Description: The posterior dominant rhythm consists of 80 Hz activity of moderate voltage (25-35 uV) seen predominantly in posterior head regions, symmetric and reactive to eye opening and eye closing. Sleep was characterized by vertex waves, sleep spindles (12 to 14 Hz), maximal frontocentral region. EEG showed continuous 3 to 6 Hz theta-delta slowing in right hemisphere, maximal right frontal region. Hyperventilation and photic stimulation were not performed.     ABNORMALITY - Continuous slow, right hemisphere, maximal right frontal region  IMPRESSION: This study is suggestive of cortical dysfunction arising from right hemisphere, maximal right frontal region likely secondary to underlying stroke. No seizures or epileptiform discharges were seen throughout the recording.  Please note lack of epileptiform activity during interictal EEG does not exclude the diagnosis of epilepsy.  [name]

## 2023-10-03 NOTE — Consult Note (Signed)
NAME:  Elizabeth [name], MRN:  [id number], DOB:  Dec 20, 1950, LOS: 0 ADMISSION DATE:  10/02/2023, CONSULTATION DATE:  10/03/2023 REFERRING MD:  Dr. Sherryll Burger, CHIEF COMPLAINT:  Hypotension   History of Present Illness:  72 y.o. female with medical history significant of remote history of partial seizure of Dilantin, recently diagnosed with cardiac amyloidosis, HTN, HLD, chronic A-fib on Eliquis, chronic lymphedema, IIDM, CKD stage IIIb, diabetic neuropathy, who presented to [facility] ED on 10/02/23 with worsening of generalized weakness and new onset of partial seizure   Patient has a remote history of partial seizure with right facial tingling and muscle twitching about 30 years ago, she was on Dilantin for some years then discontinued and she has remained seizure-free for last 30 years.  Patient was recently diagnosed with cardiac amyloidosis and no started on tafamidis meglumine, she just started to taking the pills yesterday.  In addition, and also has a chronic bilateral lower extremity lymphedema chronic wound has been followed with podiatry who started patient on Levaquin 250 mg daily 5 days ago for a small ulcer.  This morning, patient was at rest and started to feel labs forehead and left face tingling sensation and then followed by muscle twitching, she could not stop the twitching by holding the left face and symptoms lasted 5 minutes.  Denied any LOC, no shaking of other parts of the body.  Recently patient also has worsening of peripheral edema and cardiologist increased her Lasix dosage 7 days ago.  Patient does report of increasing urine output and improved leg swelling.  She denied any lightheadedness blurry vision shortness of breath or chest pain.   ED Course: Initial Vital Signs: Temperature 97/5 F, pulse 73, RR 20, BP 86/50, SpO2 99% Significant Labs: Bicarb 16, BUN 54, Creatinine 4.31, AST 67, ALT 84, BNP 116, HS Troponin 10, WBC 11.3 Imaging CT Head w/o contrast>>IMPRESSION: No hemorrhage  or CT evidence of an acute cortical infarct.   She was admitted by the Hospitalist team for further workup and treatment.  Please see "Significant Hospital Events" section below for full detailed hospital course.   Pertinent  Medical History   Past Medical History:  Diagnosis Date   Cancer [facility])    breast   Carcinoma of right breast (HCC) 05/20/2014   Diabetes mellitus without complication (HCC)    Hypertension    Lymphedema    Personal history of radiation therapy 2015   Right breast   PONV (postoperative nausea and vomiting)    Psoriasis 1990   Stroke (HCC) 2000   residual left arm weakness   Varicose veins     Micro Data:  N/A  Antimicrobials:   Anti-infectives (From admission, onward)    Start     Dose/Rate Route Frequency Ordered Stop   10/02/23 2200  doxycycline (VIBRA-TABS) tablet 100 mg        100 mg Oral Every 12 hours 10/02/23 1612 10/04/23 2159       Significant Hospital Events: Including procedures, antibiotic start and stop dates in addition to other pertinent events   11/24: Admitted by [facility] for generalized weakness and new onset of partial seizures 11/25: Neurology, Cardiology/Advanced HF consulted.  Noted to have hypotension, to transfer to [facility] with PCCM consultation in case were to need vasopressors.  Started on Midodrine, lactic normal and BP improved.  Interim History / Subjective:  -Pt seen in Stepdown upon transfer -Awake and alert, asymptomatic, lactic is normal -Received midodrine, BP now 102/64 (74) -PCCM will sign off, but  is available should she require vasopressors or any critical care needs arise  Objective   Blood pressure (!) 94/58, pulse 65, temperature 97.9 F (36.6 C), temperature source Oral, resp. rate 19, height 5\' 3"  (1.6 m), weight 88 kg, SpO2 100%.        Intake/Output Summary (Last 24 hours) at 10/03/2023 1739 Last data filed at 10/03/2023 1600 Gross per 24 hour  Intake 164.43 ml  Output --  Net 164.43 ml    Filed Weights   10/02/23 1318  Weight: 88 kg    Examination: General: Acute on chronically ill-appearing frail elderly female, laying in bed, on room air, no acute distress HENT: Atraumatic, normocephalic, neck supple, no JVD Lungs: Clear breath sounds throughout, even, nonlabored Cardiovascular: Irregularly irregular rhythm, no murmurs, rubs, gallops Abdomen: Soft, nontender, nondistended, no guarding or rebound tenderness, bowel sounds positive x 4 Extremities: Chronic lymphedema to bilateral lower extremities, currently wrapped Neuro: Awake and alert, oriented x 3, moves all extremities to commands, no focal deficits GU: Deferred  Resolved Hospital Problem list     Assessment & Plan:   #Hypotension ~ RESOLVED #Chronic HFpEF #Recent diagnosis of Cardiac Amyloidosis #Chronic Atrial Fibrillation -Continuous cardiac monitoring -Maintain MAP >65 -Cautious IV fluids -Vasopressors as needed to maintain MAP goal -Start Midodrine -Lactic acid is normalized -Diuresis as BP and renal function permits ~ holding for now due to hypotension -Cardiology/Advanced Heart Failure consulted, appreciate input -Continue Eliquis -Continue Tafamadis  #AKI on CKD Stage IIIb #Metabolic Acidosis -Monitor I&O's / urinary output -Follow BMP -Ensure adequate renal perfusion -Avoid nephrotoxic agents as able -Replace electrolytes as indicated ~ Pharmacy following for assistance with electrolyte replacement -Hold ACEi and Spironolactone for now -Low threshold for Nephrology consultation  #Partial Seizure ? Due to recent use of Levaquin and lowered seizure threshold -Treatment of metabolic derangements as outlined above -Provide supportive care -Promote normal sleep/wake cycle and family presence -Avoid sedating medications as able -Seizure precautions -EEG showed cortical dysfunction arising from right hemisphere, maximal right frontal region likely secondary to underlying stroke. No  seizure or epileptiform discharges were seen.  -Neurology consulted, appreciate input   Best Practice (right click and "Reselect all SmartList Selections" daily)   Diet/type: Regular consistency (see orders) DVT prophylaxis: DOAC GI prophylaxis: N/A Lines: N/A Foley:  N/A Code Status:  full code Last date of multidisciplinary goals of care discussion [N/A]  Labs   CBC: Recent Labs  Lab 10/02/23 1327  WBC 11.3*  HGB 12.9  HCT 38.9  MCV 87.6  PLT 180    Basic Metabolic Panel: Recent Labs  Lab 10/02/23 1327 10/03/23 0626 10/03/23 1432  NA 137 138 135  K 4.5 4.1 3.9  CL 109 110 112*  CO2 16* 19* 16*  GLUCOSE 89 80 144*  BUN 54* 49* 47*  CREATININE 4.31* 3.70* 3.31*  CALCIUM 8.4* 7.7* 7.6*   GFR: Estimated Creatinine Clearance: 16.4 mL/min (A) (by C-G formula based on SCr of 3.31 mg/dL (H)). Recent Labs  Lab 10/02/23 1327  WBC 11.3*    Liver Function Tests: Recent Labs  Lab 10/02/23 1327  AST 67*  ALT 84*  ALKPHOS 94  BILITOT 0.8  PROT 6.4*  ALBUMIN 3.2*   No results for input(s): "LIPASE", "AMYLASE" in the last 168 hours. No results for input(s): "AMMONIA" in the last 168 hours.  ABG No results found for: "PHART", "PCO2ART", "PO2ART", "HCO3", "TCO2", "ACIDBASEDEF", "O2SAT"   Coagulation Profile: No results for input(s): "INR", "PROTIME" in the last 168 hours.  Cardiac  Enzymes: No results for input(s): "CKTOTAL", "CKMB", "CKMBINDEX", "TROPONINI" in the last 168 hours.  HbA1C: Hgb A1c MFr Bld  Date/Time Value Ref Range Status  07/03/2023 02:13 PM 6.5 (H) 4.8 - 5.6 % Final    Comment:    (NOTE) Pre diabetes:          5.7%-6.4%  Diabetes:              >6.4%  Glycemic control for   <7.0% adults with diabetes   06/07/2022 11:21 AM 6.4 (H) 4.8 - 5.6 % Final    Comment:    (NOTE) Pre diabetes:          5.7%-6.4%  Diabetes:              >6.4%  Glycemic control for   <7.0% adults with diabetes     CBG: Recent Labs  Lab  10/02/23 2059 10/03/23 0805 10/03/23 0914 10/03/23 1142 10/03/23 1654  GLUCAP 91 66* 177* 85 78    Review of Systems:   Positives in BOLD:  Gen: Denies fever, chills, weight change, fatigue, night sweats HEENT: Denies blurred vision, double vision, hearing loss, tinnitus, sinus congestion, rhinorrhea, sore throat, neck stiffness, dysphagia PULM: Denies shortness of breath, cough, sputum production, hemoptysis, wheezing CV: Denies chest pain, edema, orthopnea, paroxysmal nocturnal dyspnea, palpitations GI: Denies abdominal pain, nausea, vomiting, diarrhea, hematochezia, melena, constipation, change in bowel habits GU: Denies dysuria, hematuria, polyuria, oliguria, urethral discharge Endocrine: Denies hot or cold intolerance, polyuria, polyphagia or appetite change Derm: Denies rash, dry skin, scaling or peeling skin change Heme: Denies easy bruising, bleeding, bleeding gums Neuro: Denies headache, numbness, generalized weakness, slurred speech, loss of memory or consciousness   Past Medical History:  She,  has a past medical history of Cancer (HCC), Carcinoma of right breast (HCC) (05/20/2014), Diabetes mellitus without complication (HCC), Hypertension, Lymphedema, Personal history of radiation therapy (2015), PONV (postoperative nausea and vomiting), Psoriasis (1990), Stroke (HCC) (2000), and Varicose veins.   Surgical History:   Past Surgical History:  Procedure Laterality Date   AMPUTATION Left 03/04/2021   Procedure: AMPUTATION RAY;  Surgeon: Linus Galas, DPM;  Location: [facility] ORS;  Service: Podiatry;  Laterality: Left;   AMPUTATION TOE Right 01/25/2017   Procedure: AMPUTATION TOE   ;  Surgeon: Recardo Evangelist, DPM;  Location: [facility] ORS;  Service: Podiatry;  Laterality: Right;   BREAST EXCISIONAL BIOPSY Right 2015   [facility] / with radiation   BREAST LUMPECTOMY Right 04-2014   followed by radiation,  no chemo   BREAST REDUCTION SURGERY  1977   CHOLECYSTECTOMY N/A 09/23/2015    Procedure: LAPAROSCOPIC CHOLECYSTECTOMY;  Surgeon: Geoffry Paradise, MD;  Location: [facility] ORS;  Service: General;  Laterality: N/A;   DILATION AND CURETTAGE OF UTERUS  2010   EYE SURGERY Left 2015   cataracts and left eye detached retina repair   EYE SURGERY Right 2013   cataract with len implant   GASTRIC ROUX-EN-Y N/A 09/23/2015   Procedure: LAPAROSCOPIC ROUX-EN-Y GASTRIC;  Surgeon: Geoffry Paradise, MD;  Location: [facility] ORS;  Service: General;  Laterality: N/A;   HIATAL HERNIA REPAIR N/A 09/23/2015   Procedure: LAPAROSCOPIC REPAIR OF HIATAL HERNIA;  Surgeon: Geoffry Paradise, MD;  Location: [facility] OR[facility];  Service: General;  Laterality: N/A;   IRRIGATION AND DEBRIDEMENT FOOT Right 01/25/2017   Procedure: IRRIGATION AND DEBRIDEMENT FOOT;  Surgeon: Recardo Evangelist, DPM;  Location: [facility] OR[facility];  Service: Podiatry;  Laterality: Right;   IRRIGATION AND DEBRIDEMENT FOOT Right 01/29/2017  Procedure: IRRIGATION AND DEBRIDEMENT FOOT and application of wound vac;  Surgeon: Recardo Evangelist, DPM;  Location: [facility];  Service: Podiatry;  Laterality: Right;   LOWER EXTREMITY ANGIOGRAPHY Right 01/28/2017   Procedure: Lower Extremity Angiography;  Surgeon: Renford Dills, MD;  Location: [facility];  Service: Cardiovascular;  Laterality: Right;   LOWER EXTREMITY ANGIOGRAPHY Left 03/06/2021   Procedure: Lower Extremity Angiography;  Surgeon: Renford Dills, MD;  Location: [facility];  Service: Cardiovascular;  Laterality: Left;   LOWER EXTREMITY INTERVENTION  01/28/2017   Procedure: Lower Extremity Intervention;  Surgeon: Renford Dills, MD;  Location: [facility];  Service: Cardiovascular;;   REDUCTION MAMMAPLASTY       Social History:   reports that she has never smoked. She has never been exposed to tobacco smoke. She has never used smokeless tobacco. She reports that she does not drink alcohol and does not use drugs.   Family History:  Her family history includes Breast cancer (age of  onset: 17) in her mother; Cancer in her mother; Diabetes in her brother, father, mother, and sister.   Allergies Allergies  Allergen Reactions   Levaquin [Levofloxacin] Other (See Comments)    Partial seizure     Home Medications  Prior to Admission medications   Medication Sig Start Date End Date Taking? Authorizing Provider  acetaminophen (TYLENOL) 325 MG tablet Take 2 tablets (650 mg total) by mouth every 6 (six) hours as needed for mild pain (or Fever >/= 101). 03/07/21  Yes Pennie Banter, DO  apixaban (ELIQUIS) 5 MG TABS tablet Take 1 tablet (5 mg total) by mouth 2 (two) times daily. 07/21/23  Yes Hammock, Sheri, NP  Calcium Carb-Cholecalciferol (CALCIUM 1000 + D PO) Take 1 tablet by mouth daily.   Yes [provider]  furosemide (LASIX) 40 MG tablet Take 1 tablet (40 mg total) by mouth 2 (two) times daily. 09/23/23 12/22/23 Yes Romie Minus, MD  gabapentin (NEURONTIN) 300 MG capsule Take 300 mg by mouth daily. 06/14/23  Yes [provider]  levofloxacin (LEVAQUIN) 250 MG tablet Take 250 mg by mouth daily.   Yes [provider]  linagliptin (TRADJENTA) 5 MG TABS tablet Take 5 mg by mouth daily.   Yes [provider]  lisinopril (ZESTRIL) 10 MG tablet Take 1 tablet (10 mg total) by mouth daily. 09/23/23 12/22/23 Yes Romie Minus, MD  potassium chloride (KLOR-CON) 10 MEQ tablet Take 2 tablets (20 mEq total) by mouth daily. 09/13/23  Yes Hammock, Lavonna Rua, NP  pravastatin (PRAVACHOL) 40 MG tablet Take 40 mg by mouth daily. In afternoon   Yes [provider]  spironolactone (ALDACTONE) 25 MG tablet Take 1 tablet (25 mg total) by mouth daily. 08/26/23  Yes Romie Minus, MD  Tafamidis Meglumine, Cardiac, 20 MG CAPS Take 4 capsules (80 mg total) by mouth daily. 09/23/23  Yes Romie Minus, MD  apixaban (ELIQUIS) 5 MG TABS tablet Take 1 tablet (5 mg total) by mouth 2 (two) times daily. 07/21/23   Charlsie Quest, NP  furosemide (LASIX)  40 MG tablet Take 1 tablet (40 mg total) by mouth daily. 08/24/23   Lanier Prude, MD  TRUE METRIX BLOOD GLUCOSE TEST test strip  10/13/18   [provider]  TRUEPLUS LANCETS 33G MISC  10/13/18   [provider]     Care time: 45 minutes     Harlon Ditty, AGACNP-BC [facility] Prefer epic messenger  for cross cover needs If after hours, please call E-link

## 2023-10-03 NOTE — Progress Notes (Signed)
ReDS Vest / Clip - 10/03/23 1500       ReDS Vest / Clip   Station Marker B    Ruler Value 32.5    ReDS Value Range Low volume    ReDS Actual Value 32            Roxy Horseman, RN, BSN [facility] Heart Failure Navigator Secure Chat Only

## 2023-10-04 ENCOUNTER — Ambulatory Visit: Payer: Medicare PPO

## 2023-10-04 DIAGNOSIS — N179 Acute kidney failure, unspecified: Secondary | ICD-10-CM | POA: Diagnosis not present

## 2023-10-04 DIAGNOSIS — R569 Unspecified convulsions: Secondary | ICD-10-CM | POA: Diagnosis not present

## 2023-10-04 DIAGNOSIS — I43 Cardiomyopathy in diseases classified elsewhere: Secondary | ICD-10-CM | POA: Diagnosis not present

## 2023-10-04 DIAGNOSIS — E854 Organ-limited amyloidosis: Secondary | ICD-10-CM | POA: Diagnosis not present

## 2023-10-04 DIAGNOSIS — I4891 Unspecified atrial fibrillation: Secondary | ICD-10-CM | POA: Diagnosis not present

## 2023-10-04 LAB — BASIC METABOLIC PANEL
Anion gap: 5 (ref 5–15)
BUN: 52 mg/dL — ABNORMAL HIGH (ref 8–23)
CO2: 22 mmol/L (ref 22–32)
Calcium: 7.9 mg/dL — ABNORMAL LOW (ref 8.9–10.3)
Chloride: 112 mmol/L — ABNORMAL HIGH (ref 98–111)
Creatinine, Ser: 3.11 mg/dL — ABNORMAL HIGH (ref 0.44–1.00)
GFR, Estimated: 15 mL/min — ABNORMAL LOW (ref >60)
Glucose, Bld: 122 mg/dL — ABNORMAL HIGH (ref 70–99)
Potassium: 4.3 mmol/L (ref 3.5–5.1)
Sodium: 139 mmol/L (ref 135–145)

## 2023-10-04 LAB — CBC
HCT: 34.2 % — ABNORMAL LOW (ref 36.0–46.0)
Hemoglobin: 11.9 g/dL — ABNORMAL LOW (ref 12.0–15.0)
MCH: 29.5 pg (ref 26.0–34.0)
MCHC: 34.8 g/dL (ref 30.0–36.0)
MCV: 84.7 fL (ref 80.0–100.0)
Platelets: 151 10*3/uL (ref 150–400)
RBC: 4.04 MIL/uL (ref 3.87–5.11)
RDW: 14.9 % (ref 11.5–15.5)
WBC: 10.8 10*3/uL — ABNORMAL HIGH (ref 4.0–10.5)
nRBC: 0 % (ref 0.0–0.2)

## 2023-10-04 LAB — GLUCOSE, CAPILLARY
Glucose-Capillary: 130 mg/dL — ABNORMAL HIGH (ref 70–99)
Glucose-Capillary: 89 mg/dL (ref 70–99)
Glucose-Capillary: 139 mg/dL — ABNORMAL HIGH (ref 70–99)
Glucose-Capillary: 103 mg/dL — ABNORMAL HIGH (ref 70–99)
Glucose-Capillary: 124 mg/dL — ABNORMAL HIGH (ref 70–99)

## 2023-10-04 LAB — MAGNESIUM: Magnesium: 1.3 mg/dL — ABNORMAL LOW (ref 1.7–2.4)

## 2023-10-04 LAB — PHOSPHORUS: Phosphorus: 3.6 mg/dL (ref 2.5–4.6)

## 2023-10-04 MED ORDER — MAGNESIUM SULFATE 4 GM/100ML IV SOLN: 4.0000 g | Freq: Once | INTRAVENOUS | Status: DC

## 2023-10-04 MED ORDER — MAGNESIUM SULFATE 2 GM/50ML IV SOLN
2.0000 g | Freq: Once | INTRAVENOUS | Status: AC
  Administered 2023-10-04: 2 g via INTRAVENOUS
  Filled 2023-10-04: qty 50

## 2023-10-04 MED ORDER — TAFAMIDIS MEGLUMINE (CARDIAC) 20 MG PO CAPS
80.0000 mg | ORAL_CAPSULE | Freq: Every day | ORAL | Status: DC
  Administered 2023-10-05 – 2023-10-10 (×6): 80 mg via ORAL
  Filled 2023-10-04 (×3): qty 4

## 2023-10-04 MED ORDER — ONDANSETRON HCL 4 MG/2ML IJ SOLN
4.0000 mg | Freq: Once | INTRAMUSCULAR | Status: AC
  Administered 2023-10-04: 4 mg via INTRAVENOUS
  Filled 2023-10-04: qty 2

## 2023-10-04 NOTE — Progress Notes (Signed)
1      PROGRESS NOTE    Elizabeth [name]  ZOX:[id number] DOB: May 25, 1951 DOA: 10/02/2023 PCP: Leanna Sato, MD    Brief Narrative:   72 y.o. female with medical history significant of remote history of partial seizure of Dilantin, recently diagnosed with cardiac amyloidosis, HTN, HLD, chronic A-fib on Eliquis, chronic lymphedema, IIDM, CKD stage IIIb, diabetic neuropathy, presented with worsening of generalized weakness and new onset of partial seizure   11/25: Neurology, cardiology and PCCM consult.  Transfer to stepdown due to persistent hypotension 11/26: BP improved/stable. Transfer to PCU, increased Tafamidis dose, ST eval, Started Onfi for seizure   Assessment & Plan:   Principal Problem:   Seizure (HCC) Active Problems:   AKI (acute kidney injury) (HCC)   Partial seizure (HCC)   Cardiac amyloidosis (HCC)  Partial seizure -Clinically suspect this is related to recent use of Levaquin which lowered seizure threshold as patient has remote history of partial seizure 30 years ago.  On top of that patient has been having AKI process, as a result there might be a toxic level of Levaquin buildup lower seizure threshold. -EEG shows cortical dysfunction arising from right hemisphere, maximal right frontal region likely secondary to underlying stroke.  No seizure or epileptiform discharges were seen. -Discontinued Levaquin, change to doxycycline for 2 more days -neurology consult-Dr. Otelia Limes seen, started Onfi (Clobazam) and stopped other AEDs - no further seizure in the Hospit[facility]   AKI on CKD stage IIIb -Prerenal, clinically volume contracted with hypotension, slowly improving -Hold off diuresis, hold off spironolactone and ACEI Lab Results  Component Value Date   CREATININE 3.11 (H) 10/04/2023   CREATININE 3.31 (H) 10/03/2023   CREATININE 3.70 (H) 10/03/2023   -Avoid nephrotoxic medications.  Monitor renal function.  If any worsening, consider nephrology consult    Deconditioning -PT, OT evaluation - recommends SNF, TOC aware   Recently diagnosed cardiac amyloidosis Chronic Diastolic CHF Persistent Hypotension -Continue Tafamadis (increased dose to 80 mg) Advance CHF team following Echo (08/24) - EF 55-60% Hold Diuretics Continue Midodrine.   Chronic A-fib -Controlled, continue Eliquis   DM2 -SSI for now   Abnormal UA -Negative urine protein, low suspicion for renal amyloidosis at this point -Leukocytes in urine but negative for nitrite and presence of epithelial cells indicating contamination.  Patient has no symptoms UTI, monitor off antibiotics.   Chronic lymphedema with chronic ultrasound bilateral lower extremities Ankle wound on left leg POA -Outpatient follow-up with podiatry - wound care seen for the wound as below and placed recommendations - Wound in the left leg, by the ankle Measurement: Approx. 4x4x.1 Wound bed: Red 100% - Xeroform on the wound bed, cover with foam dressing. Apply compressive therapy.     DVT prophylaxis: Eliquis  apixaban (ELIQUIS) tablet 5 mg     Code Status: Full Code Family Communication: ( NO "discussed with patient") Disposition Plan: Possible D/C in 3-4 days depending on clinical condition and cardio eval   Consultants:  Neuro Cardio PCCM   Antimicrobials:  Doxy completed on 11/26   Subjective:  Feeling Weak,  some bradycardia (in 30s) with gagging spells this am.  Objective: Vitals:   10/04/23 1500 10/04/23 1600 10/04/23 1700 10/04/23 1800  BP: (!) 116/55 (!) 102/45 124/65 131/89  Pulse:  (!) 47  (!) 52  Resp:  16  19  Temp:  98.1 F (36.7 C)    TempSrc:  Axillary    SpO2:  100%  98%  Weight:  Height:        Intake/Output Summary (Last 24 hours) at 10/04/2023 1830 Last data filed at 10/04/2023 1700 Gross per 24 hour  Intake --  Output 1175 ml  Net -1175 ml   Filed Weights   10/02/23 1318  Weight: 88 kg    Examination:  General exam: Appears calm and  comfortable  Respiratory system: Clear to auscultation. Respiratory effort normal. Cardiovascular system: Irregular rhythm. Gastrointestinal system: Abdomen is soft, benign Central nervous system: Alert and oriented. No focal neurological deficits. Extremities: Symmetric 5 x 5 power. Skin: No rashes, lesions or ulcers Psychiatry: Judgement and insight appear normal. Mood & affect appropriate.     Data Reviewed: I have personally reviewed following labs and imaging studies  CBC: Recent Labs  Lab 10/02/23 1327 10/04/23 0436  WBC 11.3* 10.8*  HGB 12.9 11.9*  HCT 38.9 34.2*  MCV 87.6 84.7  PLT 180 151   Basic Metabolic Panel: Recent Labs  Lab 10/02/23 1327 October 13, 2023 0626 October 13, 2023 1432 10/04/23 0436  NA 137 138 135 139  K 4.5 4.1 3.9 4.3  CL 109 110 112* 112*  CO2 16* 19* 16* 22  GLUCOSE 89 80 144* 122*  BUN 54* 49* 47* 52*  CREATININE 4.31* 3.70* 3.31* 3.11*  CALCIUM 8.4* 7.7* 7.6* 7.9*  MG  --   --   --  1.3*  PHOS  --   --   --  3.6   GFR: Estimated Creatinine Clearance: 17.4 mL/min (A) (by C-G formula based on SCr of 3.11 mg/dL (H)). Liver Function Tests: Recent Labs  Lab 10/02/23 1327  AST 67*  ALT 84*  ALKPHOS 94  BILITOT 0.8  PROT 6.4*  ALBUMIN 3.2*    CBG: Recent Labs  Lab October 13, 2023 1840 10/04/23 0039 10/04/23 0755 10/04/23 1135 10/04/23 1645  GLUCAP 150* 130* 89 139* 103*   Radiology Studies: EEG adult  Result Date: 10/13/23 Charlsie Quest, MD     10-13-2023 11:37 AM Patient Name: Elizabeth [name] MRN: [id number] Epilepsy Attending: Charlsie Quest Referring Physician/Provider: Emeline General, MD Date: October 13, 2023 Duration: 27.47 mins Patient history: 72 yo F with remote history of partial seizure with right facial tingling and muscle twitching. EEG to evaluate for seizure Level of alertness: Awake, asleep AEDs during EEG study: GBP Technical aspects: This EEG study was done with scalp electrodes positioned according to the 10-20  International system of electrode placement. Electrical activity was reviewed with band pass filter of 1-70Hz , sensitivity of 7 uV/mm, display speed of 74mm/sec with a 60Hz  notched filter applied as appropriate. EEG data were recorded continuously and digitally stored.  Video monitoring was available and reviewed as appropriate. Description: The posterior dominant rhythm consists of 80 Hz activity of moderate voltage (25-35 uV) seen predominantly in posterior head regions, symmetric and reactive to eye opening and eye closing. Sleep was characterized by vertex waves, sleep spindles (12 to 14 Hz), maximal frontocentral region. EEG showed continuous 3 to 6 Hz theta-delta slowing in right hemisphere, maximal right frontal region. Hyperventilation and photic stimulation were not performed.   ABNORMALITY - Continuous slow, right hemisphere, maximal right frontal region IMPRESSION: This study is suggestive of cortical dysfunction arising from right hemisphere, maximal right frontal region likely secondary to underlying stroke. No seizures or epileptiform discharges were seen throughout the recording. Please note lack of epileptiform activity during interictal EEG does not exclude the diagnosis of epilepsy. Priyanka Annabelle Harman        Scheduled Meds:  apixaban  5 mg Oral BID   Chlorhexidine Gluconate Cloth  6 each Topical Daily   cloBAZam  5 mg Oral BID   gabapentin  300 mg Oral Daily   insulin aspart  0-9 Units Subcutaneous TID WC   midodrine  5 mg Oral TID WC   pravastatin  40 mg Oral Daily   sodium bicarbonate  650 mg Oral BID   sodium chloride flush  10 mL Intravenous Q12H   [START ON 10/05/2023] Tafamidis Meglumine (Cardiac)  80 mg Oral Daily   Transfer to PCU   LOS: 1 day    Time spent: 35 mins    Alvon Nygaard Sherryll Burger, MD [facility] Pager [phone]  If 7PM-7AM, please contact night-coverage [email or web address] Password [facility] 10/04/2023, 6:30 PM

## 2023-10-04 NOTE — Progress Notes (Signed)
PHARMACY CONSULT NOTE - FOLLOW UP  Pharmacy Consult for Electrolyte Monitoring and Replacement   Recent Labs: Potassium (mmol/L)  Date Value  10/04/2023 4.3  11/18/2014 3.7   Magnesium (mg/dL)  Date Value  38/75/6433 1.3 (L)   Calcium (mg/dL)  Date Value  29/51/8841 7.9 (L)   Calcium, Total (mg/dL)  Date Value  66/04/3015 9.0   Albumin (g/dL)  Date Value  11/16/3233 3.2 (L)  11/18/2014 3.4   Phosphorus (mg/dL)  Date Value  57/32/2025 3.6   Sodium (mmol/L)  Date Value  10/04/2023 139  07/21/2023 142  11/18/2014 139     Assessment: 72 y.o. 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   Goal of Therapy:  Electrolytes WNL  Plan:  ---2 grams IV magnesium sulfate x 1 ---recheck electrolytes in am  Lowella Bandy ,PharmD Clinical Pharmacist 10/04/2023 7:25 AM

## 2023-10-04 NOTE — [place] FL2 (Signed)
[place] MEDICAID FL2 LEVEL OF CARE FORM     IDENTIFICATION  Patient Name: Elizabeth [name] Birthdate: September 13, 1951 Sex: female Admission Date (Current Location): 10/02/2023  [facility] and [place] Number:  Chiropodist and Address:  [facility], [address] [place], [place] [address]      Provider Number: [id number]  Attending Physician Name and Address:  Delfino Lovett, MD  Relative Name and Phone Number:       Current Level of Care: Hospital Recommended Level of Care: Skilled Nursing Facility Prior Approval Number:    Date Approved/Denied:   PASRR Number: [id number]  Discharge Plan: SNF    Current Diagnoses: Patient Active Problem List   Diagnosis Date Noted   Partial seizure (HCC) 10/02/2023   Cardiac amyloidosis (HCC) 10/02/2023   Seizure (HCC) 10/02/2023   Syncope and collapse 07/02/2023   CVA (cerebral vascular accident) (HCC) 07/02/2023   Depression 07/02/2023   Obesity (BMI 30-39.9) 07/02/2023   QT prolongation 07/02/2023   PAF (paroxysmal atrial fibrillation) (HCC) 07/02/2023   Leg edema 07/02/2023   UTI (urinary tract infection) 07/02/2023   Sepsis due to gram-negative UTI (HCC) 06/06/2022   Hydroureteronephrosis 06/06/2022   Type 2 diabetes mellitus with peripheral neuropathy (HCC) 06/06/2022   AKI (acute kidney injury) (HCC) 06/06/2022   Essential hypertension 06/06/2022   History of TIA (transient ischemic attack) 06/06/2022   Dyslipidemia 06/06/2022   Lower urinary tract infectious disease    Fever    Lymphedema 10/10/2021   TIA (transient ischemic attack) 04/27/2021   Diabetic polyneuropathy associated with type 2 diabetes mellitus (HCC) 04/07/2021   Cellulitis in diabetic foot (HCC) 03/03/2021   Sepsis (HCC) 05/10/2018   Pressure injury of skin 05/10/2018   Malignant neoplasm of female breast (HCC) 02/02/2017   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/28/2017   Type 2 diabetes mellitus with peripheral vascular  disease (HCC) 01/28/2017   Cellulitis 01/22/2017   Hypokalemia 01/22/2017   Morbid obesity (HCC) 09/23/2015   Biliary sludge 07/23/2015   Steatosis of liver 07/23/2015   Diabetes mellitus, type 2 (HCC) 05/21/2015   Peripheral vascular disease (HCC) 05/21/2015   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) 05/21/2015   Chronic venous insufficiency 02/05/2015   Mixed incontinence 10/24/2014   Extreme obesity 10/24/2014   Hernia, rectovaginal 10/24/2014   Mixed stress and urge urinary incontinence 10/24/2014   Airway hyperreactivity 02/19/2013   Cerebral vascular accident (HCC) 02/19/2013   Clinical depression 02/19/2013   Type 2 diabetes mellitus with obesity (HCC) 02/19/2013   Colon, diverticulosis 02/19/2013   Acid reflux 02/19/2013   HLD (hyperlipidemia) 02/19/2013   BP (high blood pressure) 02/19/2013   Cerebral infarction, unspecified (HCC) 02/19/2013   Gastro-esophageal reflux disease without esophagitis 02/19/2013   Unspecified asthma, uncomplicated 02/19/2013   Depression, unspecified 02/19/2013   Diverticulosis of large intestine without perforation or abscess without bleeding 02/19/2013    Orientation RESPIRATION BLADDER Height & Weight     Self, Time, Situation, Place  Normal Incontinent Weight: 194 lb (88 kg) Height:  5\' 3"  (160 cm)  BEHAVIORAL SYMPTOMS/MOOD NEUROLOGICAL BOWEL NUTRITION STATUS   (None) Convulsions/Seizures Incontinent Diet (Heart healthy/carb modified. Fluid restriction 2000 mL.)  AMBULATORY STATUS COMMUNICATION OF NEEDS Skin   Limited Assist Verbally Other (Comment) (Erythema/redness, scratch marks. Wounds on both pretibials: ABD pads, compression wrap, Xeroform pads x2.)                       Personal Care Assistance  Level of Assistance  Bathing, Feeding, Dressing Bathing Assistance: Maximum assistance Feeding assistance: Limited assistance Dressing Assistance: Maximum assistance     Functional Limitations Info  Sight,  Hearing, Speech Sight Info: Impaired (Blind in left eye.) Hearing Info: Adequate Speech Info: Adequate    SPECIAL CARE FACTORS FREQUENCY  PT (By licensed PT), OT (By licensed OT)     PT Frequency: 5 x week OT Frequency: 5 x week            Contractures Contractures Info: Not present    Additional Factors Info  Code Status, Allergies Code Status Info: Full code Allergies Info: Levaquin (Levofloxacin)           Current Medications (10/04/2023):  This is the current hospital active medication list Current Facility-Administered Medications  Medication Dose Route Frequency Provider Last Rate Last Admin   acetaminophen (TYLENOL) tablet 650 mg  650 mg Oral Q6H PRN Mikey College T, MD       apixaban Everlene Balls) tablet 5 mg  5 mg Oral BID Mikey College T, MD   5 mg at 10/04/23 1046   Chlorhexidine Gluconate Cloth 2 % PADS 6 each  6 each Topical Daily Delfino Lovett, MD   6 each at 10/03/23 1858   cloBAZam (ONFI) 2.5 MG/ML oral suspension 5 mg  5 mg Oral BID Caryl Pina, MD   5 mg at 10/04/23 0034   gabapentin (NEURONTIN) capsule 300 mg  300 mg Oral Daily Mikey College T, MD   300 mg at 10/04/23 1046   insulin aspart (novoLOG) injection 0-9 Units  0-9 Units Subcutaneous TID WC Mikey College T, MD   1 Units at 10/04/23 1138   midodrine (PROAMATINE) tablet 5 mg  5 mg Oral TID WC Brynda Peon L, NP   5 mg at 10/04/23 0805   Oral care mouth rinse  15 mL Mouth Rinse PRN Emeline General, MD       Oral care mouth rinse  15 mL Mouth Rinse PRN Delfino Lovett, MD       pravastatin (PRAVACHOL) tablet 40 mg  40 mg Oral Daily Mikey College T, MD   40 mg at 10/03/23 1701   sodium bicarbonate tablet 650 mg  650 mg Oral BID Mikey College T, MD   650 mg at 10/04/23 1047   sodium chloride flush (NS) 0.9 % injection 10 mL  10 mL Intravenous Q12H Mikey College T, MD   10 mL at 10/04/23 1048   [START ON 10/05/2023] Tafamidis Meglumine (Cardiac) CAPS 80 mg  80 mg Oral Daily Laurey Morale, MD         Discharge  Medications: Please see discharge summary for a list of discharge medications.  Relevant Imaging Results:  Relevant Lab Results:   Additional Information SS#: [id number]  Margarito Liner, LCSW

## 2023-10-04 NOTE — Consult Note (Addendum)
WOC Nurse Consult Note: Reason for Consult: Requested to assess bilateral lymphedema. The consult was performed remotely, using the ICU camera. She is followed by the wound center, where she started to use the compressive therapy with Urgo K2.  Wound type: Wound in the left leg, by the ankle  Measurement: Approx. 4x4x.1 Wound bed: Red 100%   Dressing procedure/placement/frequency: Xeroform on the wound bed, cover with foam dressing. Apply compressive therapy.  Schedule [employer] tomorrow by the [facility] nurse, we do not have Urgo K2.  Thank-you,  Denyse Amass BSN, RN, [employer], WOC  (Pager: [phone])

## 2023-10-04 NOTE — Plan of Care (Signed)
  Problem: Coping: Goal: Ability to adjust to condition or change in health will improve Outcome: Progressing   Problem: Health Behavior/Discharge Planning: Goal: Ability to identify and utilize available resources and services will improve Outcome: Progressing Goal: Ability to manage health-related needs will improve Outcome: Progressing   Problem: Metabolic: Goal: Ability to maintain appropriate glucose levels will improve Outcome: Progressing   Problem: Skin Integrity: Goal: Risk for impaired skin integrity will decrease Outcome: Progressing   Problem: Fluid Volume: Goal: Ability to maintain a balanced intake and output will improve Outcome: Not Progressing

## 2023-10-04 NOTE — TOC Initial Note (Signed)
Transition of Care [facility]) - Initial/Assessment Note    Patient Details  Name: Elizabeth [name] MRN: [id number] Date of Birth: 1951/06/23  [facility]) CM/SW Contact:    Margarito Liner, LCSW Phone Number: 10/04/2023, 12:31 PM  Clinical Narrative:  CSW met with patient. No supports at bedside. CSW introduced role and explained that therapy recommendations would be discussed. Patient is willing to consider SNF placement. CSW will send out referral to see what her options are. No further concerns. CSW encouraged patient to contact CSW as needed. CSW will continue to follow patient for support and facilitate discharge to SNF once medically stable.                Expected Discharge Plan: Skilled Nursing Facility Barriers to Discharge: Continued Medical Work up   Patient Goals and CMS Choice   CMS Medicare.gov Compare Post Acute Care list provided to:: Patient        Expected Discharge Plan and Services     Post Acute Care Choice: Skilled Nursing Facility Living arrangements for the past 2 months: Single Family Home                                      Prior Living Arrangements/Services Living arrangements for the past 2 months: Single Family Home Lives with:: Spouse, Adult Children Patient language and need for interpreter reviewed:: Yes Do you feel safe going back to the place where you live?: Yes      Need for Family Participation in Patient Care: Yes (Comment) Care giver support system in place?: Yes (comment)   Criminal Activity/Legal Involvement Pertinent to Current Situation/Hospitalization: No - Comment as needed  Activities of Daily Living   ADL Screening (condition at time of admission) Independently performs ADLs?: No Does the patient have a NEW difficulty with bathing/dressing/toileting/self-feeding that is expected to last >3 days?: Yes (Initiates electronic notice to provider for possible OT consult) Does the patient have a NEW difficulty  with getting in/out of bed, walking, or climbing stairs that is expected to last >3 days?: Yes (Initiates electronic notice to provider for possible PT consult) Does the patient have a NEW difficulty with communication that is expected to last >3 days?: No Is the patient deaf or have difficulty hearing?: No Does the patient have difficulty seeing, even when wearing glasses/contacts?: Yes Does the patient have difficulty concentrating, remembering, or making decisions?: No  Permission Sought/Granted Permission sought to share information with : Facility Industrial/product designer granted to share information with : Yes, Verbal Permission Granted     Permission granted to share info w AGENCY: SNF's        Emotional Assessment Appearance:: Appears stated age Attitude/Demeanor/Rapport: Engaged, Gracious Affect (typically observed): Appropriate, Calm Orientation: : Oriented to Self, Oriented to Place, Oriented to  Time, Oriented to Situation Alcohol / Substance Use: Not Applicable Psych Involvement: No (comment)  Admission diagnosis:  Seizure (HCC) [R56.9] Weakness [R53.1] Acute renal insufficiency [N28.9] AKI (acute kidney injury) (HCC) [N17.9] Patient Active Problem List   Diagnosis Date Noted   Partial seizure (HCC) 10/02/2023   Cardiac amyloidosis (HCC) 10/02/2023   Seizure (HCC) 10/02/2023   Syncope and collapse 07/02/2023   CVA (cerebral vascular accident) (HCC) 07/02/2023   Depression 07/02/2023   Obesity (BMI 30-39.9) 07/02/2023   QT prolongation 07/02/2023   PAF (paroxysmal atrial fibrillation) (HCC) 07/02/2023   Leg edema 07/02/2023   UTI (  urinary tract infection) 07/02/2023   Sepsis due to gram-negative UTI (HCC) 06/06/2022   Hydroureteronephrosis 06/06/2022   Type 2 diabetes mellitus with peripheral neuropathy (HCC) 06/06/2022   AKI (acute kidney injury) (HCC) 06/06/2022   Essential hypertension 06/06/2022   History of TIA (transient ischemic attack)  06/06/2022   Dyslipidemia 06/06/2022   Lower urinary tract infectious disease    Fever    Lymphedema 10/10/2021   TIA (transient ischemic attack) 04/27/2021   Diabetic polyneuropathy associated with type 2 diabetes mellitus (HCC) 04/07/2021   Cellulitis in diabetic foot (HCC) 03/03/2021   Sepsis (HCC) 05/10/2018   Pressure injury of skin 05/10/2018   Malignant neoplasm of female breast (HCC) 02/02/2017   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/28/2017   Type 2 diabetes mellitus with peripheral vascular disease (HCC) 01/28/2017   Cellulitis 01/22/2017   Hypokalemia 01/22/2017   Morbid obesity (HCC) 09/23/2015   Biliary sludge 07/23/2015   Steatosis of liver 07/23/2015   Diabetes mellitus, type 2 (HCC) 05/21/2015   Peripheral vascular disease (HCC) 05/21/2015   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) 05/21/2015   Chronic venous insufficiency 02/05/2015   Mixed incontinence 10/24/2014   Extreme obesity 10/24/2014   Hernia, rectovaginal 10/24/2014   Mixed stress and urge urinary incontinence 10/24/2014   Airway hyperreactivity 02/19/2013   Cerebral vascular accident (HCC) 02/19/2013   Clinical depression 02/19/2013   Type 2 diabetes mellitus with obesity (HCC) 02/19/2013   Colon, diverticulosis 02/19/2013   Acid reflux 02/19/2013   HLD (hyperlipidemia) 02/19/2013   BP (high blood pressure) 02/19/2013   Cerebral infarction, unspecified (HCC) 02/19/2013   Gastro-esophageal reflux disease without esophagitis 02/19/2013   Unspecified asthma, uncomplicated 02/19/2013   Depression, unspecified 02/19/2013   Diverticulosis of large intestine without perforation or abscess without bleeding 02/19/2013   PCP:  Leanna Sato, MD Pharmacy:   [facility] - [place], [place] - [address] [place] [address] Phone: [phone] Fax: [phone]  [facility] - [place], [place] - [address] [place] [address] Phone: [phone] Fax: [phone]  [facility] [phone] [facility] - [place], [place] - [address] [place] [address] Phone: [phone] Fax: [phone]  [id number] [place], [place] - [address] [place] [address] Phone: [phone] Fax: [phone]  [facility] [address] [place] [address] Phone: [phone] Fax: [phone]     Social Determinants of Health (SDOH) Social History: SDOH Screenings   Food Insecurity: No Food Insecurity (10/03/2023)  Housing: Low Risk  (10/03/2023)  Transportation Needs: No Transportation Needs (10/03/2023)  Utilities: Not At Risk (10/03/2023)  Tobacco Use: Low Risk  (10/03/2023)   SDOH Interventions:     Readmission Risk Interventions     No data to display

## 2023-10-04 NOTE — Progress Notes (Addendum)
Physical Therapy Treatment Patient Details Name: Elizabeth [name] MRN: [id number] DOB: 1951-10-08 Today's Date: 10/04/2023   History of Present Illness Pt is a 72 y.o. female presenting to hospital 10/02/23 with c/o possible partial seizures (L forehead and side of her head was twitching for about 5 minutes) as well as worsening weakness.  Pt admitted with partial seizure, AKI on CKD stage IIIb, deconditioning, recently diagnosed cardiac amyloidosis, abnormal UA.  Transferred to ICU 10/03/23 d/t low BP concerns.  PMH includes DM, htn, CVA with L arm weakness, a-fib on Eliquis, CHF on Lasix, cardiac amyloidosis, chronic lymphedema, CKD, diabetic neuropathy, B toe amputations, R breast lumpectomy, L eye blindness.    PT Comments  Pt transferred to ICU yesterday d/t low BP concerns; new PT consult received.  Pt resting in bed upon PT arrival (BP 96/50 with HR fluctuating around 50 bpm); pt appearing tired but agreeable to therapy.  No c/o pain.  During session pt min assist semi-supine to sitting EOB (BP 107/62 in sitting with HR 53 bpm); max assist to stand 1st trial (with significant posterior lean) and mod assist to stand 2nd trial up to RW (mild posterior lean); mod assist to side step to L along bed a few feet with RW use; and max assist to lay back down in bed end of session.  Pt's HR fluctuating mostly 43 to 59 bpm (mostly 50-59 bpm with activity) during session.  RN updated on pt's vitals and mobility.  PT plan of care reviewed and remains appropriate.   If plan is discharge home, recommend the following: Two people to help with walking and/or transfers;A lot of help with bathing/dressing/bathroom;Assistance with cooking/housework;Assist for transportation;Help with stairs or ramp for entrance   Can travel by private vehicle     No  Equipment Recommendations  Other (comment) (TBD at next facility)    Recommendations for Other Services       Precautions / Restrictions  Precautions Precautions: Fall Precaution Comments: seizure Restrictions Weight Bearing Restrictions: No     Mobility  Bed Mobility Overal bed mobility: Needs Assistance Bed Mobility: Supine to Sit, Sit to Supine     Supine to sit: Min assist, HOB elevated, Used rails Sit to supine: Max assist   General bed mobility comments: assist for trunk and B LE's; vc's for technique    Transfers Overall transfer level: Needs assistance Equipment used: Rolling walker (2 wheels) Transfers: Sit to/from Stand Sit to Stand: Max assist, Mod assist           General transfer comment: x2 trials standing; max assist to stand 1st trial with significant posterior lean noted and pt unable to take any steps once posture corrected so pt assisted back to sitting on EOB; mod assist to stand 2nd trial; vc's for UE/LE positioning and overall technique    Ambulation/Gait Ambulation/Gait assistance: Mod assist Gait Distance (Feet):  (sidestep to L along bed a few feet) Assistive device: Rolling walker (2 wheels)   Gait velocity: decreased     General Gait Details: increased effort/time to take steps; vc's for technique   Stairs             Wheelchair Mobility     Tilt Bed    Modified Rankin (Stroke Patients Only)       Balance Overall balance assessment: Needs assistance Sitting-balance support: Feet supported, Bilateral upper extremity supported Sitting balance-Leahy Scale: Good Sitting balance - Comments: steady reaching within BOS   Standing balance support: Bilateral upper extremity supported,  Reliant on assistive device for balance Standing balance-Leahy Scale: Poor Standing balance comment: initial significant posterior lean 1st trial standing; mild posterior lean noted 2nd trial standing; assist to correct balance/posture required                            Cognition Arousal: Alert (but appearing tired (eyes closed intermittently)) Behavior During Therapy:  WFL for tasks assessed/performed Overall Cognitive Status: Within Functional Limits for tasks assessed                                          Exercises      General Comments  Nursing cleared pt for participation in physical therapy.  Pt agreeable to PT session.      Pertinent Vitals/Pain Pain Assessment Pain Assessment: No/denies pain SpO2 on room air WFL during session.    Home Living                          Prior Function            PT Goals (current goals can now be found in the care plan section) Acute Rehab PT Goals Patient Stated Goal: to improve strength and walking PT Goal Formulation: With patient Time For Goal Achievement: 10/17/23 Potential to Achieve Goals: Good Progress towards PT goals: Progressing toward goals    Frequency    Min 1X/week      PT Plan      Co-evaluation              AM-PAC PT "6 Clicks" Mobility   Outcome Measure  Help needed turning from your back to your side while in a flat bed without using bedrails?: A Little Help needed moving from lying on your back to sitting on the side of a flat bed without using bedrails?: A Lot Help needed moving to and from a bed to a chair (including a wheelchair)?: A Lot Help needed standing up from a chair using your arms (e.g., wheelchair or bedside chair)?: A Lot Help needed to walk in hospital room?: Total Help needed climbing 3-5 steps with a railing? : Total 6 Click Score: 11    End of Session Equipment Utilized During Treatment: Gait belt Activity Tolerance: Patient limited by fatigue Patient left: in bed;with call bell/phone within reach;with bed alarm set;Other (comment) (B heels floating via pillow support) Nurse Communication: Mobility status;Precautions;Other (comment) (pt's HR and BP during session) PT Visit Diagnosis: Other abnormalities of gait and mobility (R26.89);Muscle weakness (generalized) (M62.81);History of falling (Z91.81)     Time:  5284-1324 PT Time Calculation (min) (ACUTE ONLY): 23 min  Charges:    $Therapeutic Activity: 23-37 mins PT General Charges $$ ACUTE PT VISIT: 1 Visit                     Hendricks Limes, PT 10/04/23, 3:16 PM

## 2023-10-04 NOTE — Progress Notes (Addendum)
Advanced Heart Failure Rounding Note  PCP-Cardiologist: Debbe Odea, MD   Subjective:    Transferred to ICU yesterday for hypotension.   Midodrine added yesterday. SBP 90s-100s today  Denies CP/SOB. Just cold this morning.   Objective:   Weight Range: 88 kg Body mass index is 34.37 kg/m.   Vital Signs:   Temp:  [97.9 F (36.6 C)-98.3 F (36.8 C)] 97.9 F (36.6 C) (11/26 0200) Pulse Rate:  [53-92] 59 (11/26 0700) Resp:  [16-25] 20 (11/26 0700) BP: (85-133)/(44-74) 108/61 (11/26 0700) SpO2:  [95 %-100 %] 98 % (11/26 0700) Last BM Date : 10/03/23  Weight change: Filed Weights   10/02/23 1318  Weight: 88 kg    Intake/Output:   Intake/Output Summary (Last 24 hours) at 10/04/2023 0708 Last data filed at 10/04/2023 0704 Gross per 24 hour  Intake 154.43 ml  Output 575 ml  Net -420.57 ml      Physical Exam    General:  elderly appearing.  No respiratory difficulty HEENT: normal Neck: supple. JVD ~7 cm. Carotids 2+ bilat; no bruits. No lymphadenopathy or thyromegaly appreciated. Cor: PMI nondisplaced. Regular rate & irregular rhythm. No rubs, gallops or murmurs. Lungs: clear Abdomen: soft, nontender, nondistended. No hepatosplenomegaly. No bruits or masses. Good bowel sounds. Extremities: no cyanosis, clubbing, rash, edema. Bilateral legs wrapped Neuro: alert & oriented x 3, cranial nerves grossly intact. moves all 4 extremities w/o difficulty. Affect pleasant.   Telemetry   A fib 60s (Personally reviewed)    EKG    No new EKG to review  Labs    CBC Recent Labs    10/02/23 1327 10/04/23 0436  WBC 11.3* 10.8*  HGB 12.9 11.9*  HCT 38.9 34.2*  MCV 87.6 84.7  PLT 180 151   Basic Metabolic Panel Recent Labs    16/10/96 1432 10/04/23 0436  NA 135 139  K 3.9 4.3  CL 112* 112*  CO2 16* 22  GLUCOSE 144* 122*  BUN 47* 52*  CREATININE 3.31* 3.11*  CALCIUM 7.6* 7.9*  MG  --  1.3*  PHOS  --  3.6   Liver Function Tests Recent Labs     10/02/23 1327  AST 67*  ALT 84*  ALKPHOS 94  BILITOT 0.8  PROT 6.4*  ALBUMIN 3.2*   No results for input(s): "LIPASE", "AMYLASE" in the last 72 hours. Cardiac Enzymes No results for input(s): "CKTOTAL", "CKMB", "CKMBINDEX", "TROPONINI" in the last 72 hours.  BNP: BNP (last 3 results) Recent Labs    07/02/23 2016 10/02/23 1327  BNP 670.1* 116.7*    ProBNP (last 3 results) No results for input(s): "PROBNP" in the last 8760 hours.   D-Dimer No results for input(s): "DDIMER" in the last 72 hours. Hemoglobin A1C No results for input(s): "HGBA1C" in the last 72 hours. Fasting Lipid Panel No results for input(s): "CHOL", "HDL", "LDLCALC", "TRIG", "CHOLHDL", "LDLDIRECT" in the last 72 hours. Thyroid Function Tests No results for input(s): "TSH", "T4TOTAL", "T3FREE", "THYROIDAB" in the last 72 hours.  Invalid input(s): "FREET3"  Other results:   Imaging    EEG adult  Result Date: 10/03/2023 Charlsie Quest, MD     10/03/2023 11:37 AM Patient Name: Elizabeth [name] MRN: [id number] Epilepsy Attending: Charlsie Quest Referring Physician/Provider: Emeline General, MD Date: 10/03/2023 Duration: 27.47 mins Patient history: 72 yo F with remote history of partial seizure with right facial tingling and muscle twitching. EEG to evaluate for seizure Level of alertness: Awake, asleep AEDs during EEG study:  GBP Technical aspects: This EEG study was done with scalp electrodes positioned according to the 10-20 International system of electrode placement. Electrical activity was reviewed with band pass filter of 1-70Hz , sensitivity of 7 uV/mm, display speed of 60mm/sec with a 60Hz  notched filter applied as appropriate. EEG data were recorded continuously and digitally stored.  Video monitoring was available and reviewed as appropriate. Description: The posterior dominant rhythm consists of 80 Hz activity of moderate voltage (25-35 uV) seen predominantly in posterior head regions, symmetric and  reactive to eye opening and eye closing. Sleep was characterized by vertex waves, sleep spindles (12 to 14 Hz), maximal frontocentral region. EEG showed continuous 3 to 6 Hz theta-delta slowing in right hemisphere, maximal right frontal region. Hyperventilation and photic stimulation were not performed.   ABNORMALITY - Continuous slow, right hemisphere, maximal right frontal region IMPRESSION: This study is suggestive of cortical dysfunction arising from right hemisphere, maximal right frontal region likely secondary to underlying stroke. No seizures or epileptiform discharges were seen throughout the recording. Please note lack of epileptiform activity during interictal EEG does not exclude the diagnosis of epilepsy. Priyanka Annabelle Harman     Medications:     Scheduled Medications:  apixaban  5 mg Oral BID   Chlorhexidine Gluconate Cloth  6 each Topical Daily   cloBAZam  5 mg Oral BID   doxycycline  100 mg Oral Q12H   gabapentin  300 mg Oral Daily   insulin aspart  0-9 Units Subcutaneous TID WC   midodrine  5 mg Oral TID WC   pravastatin  40 mg Oral Daily   sodium bicarbonate  650 mg Oral BID   sodium chloride flush  10 mL Intravenous Q12H   Tafamidis Meglumine (Cardiac)  40 mg Oral Daily    Infusions:  magnesium sulfate bolus IVPB      PRN Medications: acetaminophen, mouth rinse, mouth rinse    Patient Profile   Elizabeth [name] is a 72 y.o. 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 AHF team to see for cardiac amyloid.   Assessment/Plan   1. Chronic diastolic CHF:  Last echo in 8/24 with EF 55-60%, normal RV.  She had a PYP scan in 10/24 read as grade 2, suggestive of cardiac  amyloidosis though H/CL not markedly elevated at 1.32.  She was started on tafamidis. Recently had Lasix increased, now with AKI.  On exam, no JVD.  REDS clip only 32%.   - Volume stable, renal function remains elevated. Would keep off GDMT until SCr goes down more.  - She has lymphedema which makes assessment of volume difficult.   - With soft BP (?component of autonomic neuropathy) and elevated creatinine, Continue  midodrine 5 mg tid.  - Continue tafamidis. Consider eventual cardiac MRI to firm up diagnosis of cardiac amyloidosis - Would consider eventual Cardiomems placement to help with volume management as exam is difficult.  2. AKI on CKD stage 3: Baseline creatinine 1.28, up to 4.3 at admission.  Suspect overdiuresis.  - As above, hold diuretic.  3. Hypotension: SBP now in 100s. She is asymptomatic.  - Avoid IV fluid but hold her diuretics.  - Would avoid pressors.  - Continue midodrine as above.  4.  Atrial fibrillation: Rate currently stable in 60s.  Chronic.  Suspect component of sick sinus syndrome with bradycardia though has been stable in hospital.  - Continue apixaban.  - No nodal blockade with sick sinus syndrome.  - May eventually need PPM.  5. Seizure: h/o prior seizures.  Suspect threshold for seizures lowered with levofloxacin use + AKI.   - Neurology following, meds adjusted 6. Wound infection: Venous stasis ulcerations with ?infection.  - Levofloxacin stopped, doxycycline started.    If BPs remain stable can transfer out of ICU to PCU bed/unit.   Length of Stay: 1  Alen Bleacher, NP  10/04/2023, 7:08 AM  Advanced Heart Failure Team Pager [phone] (M-F; 7a - 5p)  Please contact [facility] for night-coverage after hours (5p -7a ) and weekends on [email or web address]   Patient seen with NP, agree with the above note.  Creatinine is trending down, 3.11 today.  SBP 100s-120s.  No complaints.   General: NAD Neck: No JVD, no thyromegaly or thyroid nodule.  Lungs: Clear to  auscultation bilaterally with normal respiratory effort. CV: Nondisplaced PMI.  Heart regular S1/S2, no S3/S4, no murmur.  Lower legs wrapped.  Abdomen: Soft, nontender, no hepatosplenomegaly, no distention.  Skin: Intact without lesions or rashes.  Neurologic: Alert and oriented x 3.  Psych: Normal affect. Extremities: No clubbing or cyanosis.  HEENT: Normal.   Volume status looks ok and creatinine trending down.  Hold diuretics today, will eventually need to restart.   BP is stable, she is on midodrine 5 mg tid.  She may have a component of autonomic neuropathy from cardiac amyloidosis.    She remains in chronic AF on anticoagulation.   Mobilize, can leave step down unit.   Marca Ancona 10/04/2023 8:40 AM

## 2023-10-04 NOTE — Plan of Care (Signed)

## 2023-10-05 DIAGNOSIS — I4891 Unspecified atrial fibrillation: Secondary | ICD-10-CM | POA: Diagnosis not present

## 2023-10-05 DIAGNOSIS — R569 Unspecified convulsions: Secondary | ICD-10-CM | POA: Diagnosis not present

## 2023-10-05 LAB — BASIC METABOLIC PANEL
Anion gap: 2 — ABNORMAL LOW (ref 5–15)
BUN: 45 mg/dL — ABNORMAL HIGH (ref 8–23)
CO2: 24 mmol/L (ref 22–32)
Calcium: 8.5 mg/dL — ABNORMAL LOW (ref 8.9–10.3)
Chloride: 112 mmol/L — ABNORMAL HIGH (ref 98–111)
Creatinine, Ser: 2.35 mg/dL — ABNORMAL HIGH (ref 0.44–1.00)
GFR, Estimated: 22 mL/min — ABNORMAL LOW (ref >60)
Glucose, Bld: 119 mg/dL — ABNORMAL HIGH (ref 70–99)
Potassium: 5 mmol/L (ref 3.5–5.1)
Sodium: 138 mmol/L (ref 135–145)

## 2023-10-05 LAB — GLUCOSE, CAPILLARY
Glucose-Capillary: 94 mg/dL (ref 70–99)
Glucose-Capillary: 132 mg/dL — ABNORMAL HIGH (ref 70–99)
Glucose-Capillary: 168 mg/dL — ABNORMAL HIGH (ref 70–99)
Glucose-Capillary: 102 mg/dL — ABNORMAL HIGH (ref 70–99)

## 2023-10-05 LAB — CBC
HCT: 37.4 % (ref 36.0–46.0)
Hemoglobin: 12.8 g/dL (ref 12.0–15.0)
MCH: 29.1 pg (ref 26.0–34.0)
MCHC: 34.2 g/dL (ref 30.0–36.0)
MCV: 85 fL (ref 80.0–100.0)
Platelets: 161 10*3/uL (ref 150–400)
RBC: 4.4 MIL/uL (ref 3.87–5.11)
RDW: 14.8 % (ref 11.5–15.5)
WBC: 10.5 10*3/uL (ref 4.0–10.5)
nRBC: 0 % (ref 0.0–0.2)

## 2023-10-05 LAB — MAGNESIUM: Magnesium: 1.8 mg/dL (ref 1.7–2.4)

## 2023-10-05 MED ORDER — DAPAGLIFLOZIN PROPANEDIOL 10 MG PO TABS
10.0000 mg | ORAL_TABLET | Freq: Every day | ORAL | Status: DC
  Administered 2023-10-05 – 2023-10-10 (×6): 10 mg via ORAL
  Filled 2023-10-05 (×6): qty 1

## 2023-10-05 MED ORDER — FUROSEMIDE 20 MG PO TABS
20.0000 mg | ORAL_TABLET | Freq: Every day | ORAL | Status: DC
  Administered 2023-10-06 – 2023-10-10 (×5): 20 mg via ORAL
  Filled 2023-10-05 (×5): qty 1

## 2023-10-05 NOTE — Plan of Care (Signed)
Tolerating Onfi well. Continue Onfi at 5 mg po BID.   Will need outpatient follow up with Neurology at the [facility] clinic.   No further recommendations. Neurohospitalist service will sign off.   Electronically signed: Dr. Caryl Pina

## 2023-10-05 NOTE — Evaluation (Signed)
Clinical/Bedside Swallow Evaluation Patient Details  Name: Elizabeth [name] MRN: [id number] Date of Birth: 26-Mar-1951  Today's Date: 10/05/2023 Time: SLP Start Time (ACUTE ONLY): 1515 SLP Stop Time (ACUTE ONLY): 1600 SLP Time Calculation (min) (ACUTE ONLY): 45 min  Past Medical History:  Past Medical History:  Diagnosis Date   Cancer (HCC)    breast   Carcinoma of right breast (HCC) 05/20/2014   Diabetes mellitus without complication (HCC)    Hypertension    Lymphedema    Personal history of radiation therapy 2015   Right breast   PONV (postoperative nausea and vomiting)    Psoriasis 1990   Stroke (HCC) 2000   residual left arm weakness   Varicose veins    Past Surgical History:  Past Surgical History:  Procedure Laterality Date   AMPUTATION Left 03/04/2021   Procedure: AMPUTATION RAY;  Surgeon: Linus Galas, DPM;  Location: [facility];  Service: Podiatry;  Laterality: Left;   AMPUTATION TOE Right 01/25/2017   Procedure: AMPUTATION TOE   ;  Surgeon: Recardo Evangelist, DPM;  Location: [facility];  Service: Podiatry;  Laterality: Right;   BREAST EXCISIONAL BIOPSY Right 2015   [facility] / with radiation   BREAST LUMPECTOMY Right 04-2014   followed by radiation,  no chemo   BREAST REDUCTION SURGERY  1977   CHOLECYSTECTOMY N/A 09/23/2015   Procedure: LAPAROSCOPIC CHOLECYSTECTOMY;  Surgeon: Geoffry Paradise, MD;  Location: [facility] OR[facility];  Service: General;  Laterality: N/A;   DILATION AND CURETTAGE OF UTERUS  2010   EYE SURGERY Left 2015   cataracts and left eye detached retina repair   EYE SURGERY Right 2013   cataract with len implant   GASTRIC ROUX-EN-Y N/A 09/23/2015   Procedure: LAPAROSCOPIC ROUX-EN-Y GASTRIC;  Surgeon: Geoffry Paradise, MD;  Location: [facility];  Service: General;  Laterality: N/A;   HIATAL HERNIA REPAIR N/A 09/23/2015   Procedure: LAPAROSCOPIC REPAIR OF HIATAL HERNIA;  Surgeon: Geoffry Paradise, MD;  Location: [facility];  Service: General;  Laterality: N/A;   IRRIGATION AND DEBRIDEMENT  FOOT Right 01/25/2017   Procedure: IRRIGATION AND DEBRIDEMENT FOOT;  Surgeon: Recardo Evangelist, DPM;  Location: [facility];  Service: Podiatry;  Laterality: Right;   IRRIGATION AND DEBRIDEMENT FOOT Right 01/29/2017   Procedure: IRRIGATION AND DEBRIDEMENT FOOT and application of wound vac;  Surgeon: Recardo Evangelist, DPM;  Location: [facility];  Service: Podiatry;  Laterality: Right;   LOWER EXTREMITY ANGIOGRAPHY Right 01/28/2017   Procedure: Lower Extremity Angiography;  Surgeon: Renford Dills, MD;  Location: [facility];  Service: Cardiovascular;  Laterality: Right;   LOWER EXTREMITY ANGIOGRAPHY Left 03/06/2021   Procedure: Lower Extremity Angiography;  Surgeon: Renford Dills, MD;  Location: [facility];  Service: Cardiovascular;  Laterality: Left;   LOWER EXTREMITY INTERVENTION  01/28/2017   Procedure: Lower Extremity Intervention;  Surgeon: Renford Dills, MD;  Location: [facility];  Service: Cardiovascular;;   REDUCTION MAMMAPLASTY     HPI:  Elizabeth [name] is a 72 y.o. female presenting to hospital 10/02/23 with c/o possible partial seizures (L forehead and side of her head was twitching for about 5 minutes) as well as worsening weakness.  Elizabeth [name] admitted with partial seizure, AKI on CKD stage IIIb, deconditioning, recently diagnosed cardiac amyloidosis, abnormal UA.  Transferred to ICU 10/03/23 d/t low BP concerns.  PMH includes DM, htn, CVA with L arm weakness, a-fib on Eliquis, CHF on Lasix, cardiac amyloidosis, chronic lymphedema, CKD, diabetic neuropathy, B toe amputations, R breast  lumpectomy, L eye blindness.   Head CT: No hemorrhage or CT evidence of an acute cortical infarct.   No CXR.    Assessment / Plan / Recommendation  Clinical Impression   Elizabeth [name] seen for BSE today. Elizabeth [name] awake, sitting in chair in room. Elizabeth [name] verbal and followed instructions w/ cues. Edentulous at baseline - not wearing her new Dentures "yet"; "just got them and they are at home".  Elizabeth [name] on RA, afebrile. WBC  WNL. No cxr this admit.  Elizabeth [name] appears to present w/ grossly functional oropharyngeal phase swallowing in setting of Edentulous status Baseline w/ No oropharyngeal phase dysphagia noted, No neuromuscular deficits noted. Elizabeth [name] consumed po trials w/ No overt, clinical s/s of aspiration during po trials.  Elizabeth [name] appears at reduced risk for aspiration following general aspiration precautions and eating softened, cut foods easy to gum/mash. Elizabeth [name] stated she does not eat "salad b/c it's too hard to chew".  Elizabeth [name] does have challenging factors that could impact her oropharyngeal swallowing to include deconditioning/weakness, acute illness/hospitalization, and Edentulous status. These factors can increase risk for aspiration, dysphagia as well as decreased oral intake overall.   During po trials, Elizabeth [name] consumed all consistencies w/ no overt coughing, decline in vocal quality, or change in respiratory presentation during/post trials. O2 sats remained upper 90s during session. Oral phase appeared grossly [facility] w/ timely bolus management, mashing/gumming of solids, and control of bolus propulsion for A-P transfer for swallowing. Oral clearing achieved w/ all trial consistencies -- Time and moistened, cut soft foods given.  OM Exam appeared [facility] w/ no unilateral weakness noted. Speech Clear. Elizabeth [name] fed self w/ setup support.   Recommend continue a fairly Regular/Mech Soft consistency diet w/ well-Cut meats, moistened foods; avoid raw, uncooked foods tough to chew. Thin liquids. Recommend general aspiration precautions, reduce distractions during meals. Pills WHOLE in Puree for safer, easier swallowing -- Elizabeth [name] has done this in the past, and it was encourged now and for D/C.  Education given on Pills in Puree; food consistencies and easy to eat options; general aspiration precautions to Elizabeth [name]. No further skilled ST services indicated as Elizabeth [name] appears at her Baseline w/ swallowing. MD/NSG to reconsult if any new needs arise. NSG updated, agreed. MD  updated. Recommend Dietician f/u for support. Precautions posted. SLP Visit Diagnosis: Dysphagia, unspecified (R13.10) (Edentulous baseline)    Aspiration Risk   (reduced following general precautions)    Diet Recommendation   Thin;Dysphagia 3 (mechanical soft);Age appropriate regular (more chopped, moistened foods) = a fairly Regular/Mech Soft consistency diet w/ well-Cut meats, moistened foods; avoid raw, uncooked foods tough to chew. Thin liquids. Recommend general aspiration precautions, reduce distractions during meals.   Medication Administration: Whole meds with puree (baseline)    Other  Recommendations Recommended Consults:  (Dietician f/u) Oral Care Recommendations: Oral care BID;Oral care before and after PO;Patient independent with oral care (setup)    Recommendations for follow up therapy are one component of a multi-disciplinary discharge planning process, led by the attending physician.  Recommendations may be updated based on patient status, additional functional criteria and insurance authorization.  Follow up Recommendations No SLP follow up      Assistance Recommended at Discharge  intermittent  Functional Status Assessment Patient has not had a recent decline in their functional status  Frequency and Duration  (n/a)   (n/a)       Prognosis Prognosis for improved oropharyngeal function: Good Barriers to Reach Goals: Time post onset;Severity of deficits Barriers/Prognosis Comment: Edentulous at baseline  Swallow Study   General Date of Onset: 10/02/23 HPI: Elizabeth [name] is a 72 y.o. female presenting to hospital 10/02/23 with c/o possible partial seizures (L forehead and side of her head was twitching for about 5 minutes) as well as worsening weakness.  Elizabeth [name] admitted with partial seizure, AKI on CKD stage IIIb, deconditioning, recently diagnosed cardiac amyloidosis, abnormal UA.  Transferred to ICU 10/03/23 d/t low BP concerns.  PMH includes DM, htn, CVA with L arm  weakness, a-fib on Eliquis, CHF on Lasix, cardiac amyloidosis, chronic lymphedema, CKD, diabetic neuropathy, B toe amputations, R breast lumpectomy, L eye blindness.   Head CT: No hemorrhage or CT evidence of an acute cortical infarct.   No CXR. Type of Study: Bedside Swallow Evaluation Previous Swallow Assessment: none Diet Prior to this Study: Regular;Thin liquids (Level 0) Temperature Spikes Noted: No (wbc 10.5) Respiratory Status: Room air History of Recent Intubation: No Behavior/Cognition: Alert;Cooperative;Pleasant mood;Requires cueing Oral Cavity Assessment: Within Functional Limits Oral Care Completed by SLP: Recent completion by staff Oral Cavity - Dentition: Edentulous Vision: Functional for self-feeding Self-Feeding Abilities: Able to feed self;Needs assist;Needs set up Patient Positioning: Upright in chair (supported) Baseline Vocal Quality: Normal Volitional Cough: Strong Volitional Swallow: Able to elicit    Oral/Motor/Sensory Function Overall Oral Motor/Sensory Function: Within functional limits   Ice Chips Ice chips: Not tested   Thin Liquid Thin Liquid: Within functional limits Presentation: Self Fed;Cup;Straw (~8 ozs total) Other Comments: water, juices    Nectar Thick Nectar Thick Liquid: Not tested   Honey Thick Honey Thick Liquid: Not tested   Puree Puree: Within functional limits Presentation: Self Fed;Spoon (3 trials)   Solid     Solid:  (functional in setting of Edentulous status) Presentation: Self Fed Other Comments: moistened small bites for ease of gumming        Jerilynn Som, MS, CCC-SLP Speech Language Pathologist Rehab Services; [facility] [place] [phone] ([email or web address]) Carmie Lanpher 10/05/2023,4:27 PM

## 2023-10-05 NOTE — Progress Notes (Signed)
Physical Therapy Treatment Patient Details Name: Elizabeth [name] MRN: [id number] DOB: 12/29/1950 Today's Date: 10/05/2023   History of Present Illness Pt is a 72 y.o. female presenting to hospital 10/02/23 with c/o possible partial seizures (L forehead and side of her head was twitching for about 5 minutes) as well as worsening weakness.  Pt admitted with partial seizure, AKI on CKD stage IIIb, deconditioning, recently diagnosed cardiac amyloidosis, abnormal UA.  Transferred to ICU 10/03/23 d/t low BP concerns.  PMH includes DM, htn, CVA with L arm weakness, a-fib on Eliquis, CHF on Lasix, cardiac amyloidosis, chronic lymphedema, CKD, diabetic neuropathy, B toe amputations, R breast lumpectomy, L eye blindness.    PT Comments  Patient seen in conjunction with OT to maximize patient's tolerance and progression. Required CGA for bed mobility and minA+2 to stand from EOB with cues for hand placement. Transferred to chair due to incontinence in standing with assistance to place pad and underwear on. Complaining of nausea upon sitting in chair which subsided. Patient ambulated 86' with minA and chair follow. Upon sitting, patient complaining of nausea with HR dropping into 30s and BP 85/59. RN notified and in promptly to assess patient. BP at end of session in chair 123/67. Current plan remains appropriate.    If plan is discharge home, recommend the following: Two people to help with walking and/or transfers;A lot of help with bathing/dressing/bathroom;Assistance with cooking/housework;Assist for transportation;Help with stairs or ramp for entrance   Can travel by private vehicle     No  Equipment Recommendations  Other (comment) (TBD)    Recommendations for Other Services       Precautions / Restrictions Precautions Precautions: Fall Precaution Comments: seizure Restrictions Weight Bearing Restrictions: No     Mobility  Bed Mobility Overal bed mobility: Needs Assistance Bed Mobility:  Supine to Sit     Supine to sit: Contact guard          Transfers Overall transfer level: Needs assistance Equipment used: Rolling Evona Westra (2 wheels) Transfers: Sit to/from Stand Sit to Stand: Min assist, +2 physical assistance           General transfer comment: VC for hand placement    Ambulation/Gait Ambulation/Gait assistance: Min assist, +2 safety/equipment Gait Distance (Feet): 12 Feet Assistive device: Rolling Gorden Stthomas (2 wheels) Gait Pattern/deviations: Step-through pattern, Decreased stride length Gait velocity: decreased     General Gait Details: increased effort and complaining of nausea after mobility. HR dropping into 30s and BP 85/59. RN notified. +chair follow   Stairs             Wheelchair Mobility     Tilt Bed    Modified Rankin (Stroke Patients Only)       Balance Overall balance assessment: Needs assistance Sitting-balance support: Feet supported, Bilateral upper extremity supported Sitting balance-Leahy Scale: Good     Standing balance support: Bilateral upper extremity supported, Reliant on assistive device for balance Standing balance-Leahy Scale: Poor                              Cognition Arousal: Alert Behavior During Therapy: WFL for tasks assessed/performed Overall Cognitive Status: No family/caregiver present to determine baseline cognitive functioning                                 General Comments: follows commands        Exercises  General Comments General comments (skin integrity, edema, etc.): Upon initial stand and taking a few steps near EOB, pt endorsed nausea. Returned to sitting. BP 116/68. With seated rest break nausea subsided. Pt able to ambulate ~12' further with chair follow but then endorsed more nausea. Returned to sitting. BP 85/59 HR in 30's-40's for <57min before improving to 60bpm or better. RR 18-22 throughout, SpO2 99% on room air. RN notified promptly and in room  to assess pt.      Pertinent Vitals/Pain Pain Assessment Pain Assessment: No/denies pain    Home Living Family/patient expects to be discharged to:: Private residence Living Arrangements: Spouse/significant other;Children (Husband and son) Available Help at Discharge: Family;Available 24 hours/day Type of Home: House Home Access: Stairs to enter Entrance Stairs-Rails: Right;Left;Can reach both Entrance Stairs-Number of Steps: 3   Home Layout: One level Home Equipment: BSC/3in1;Cane - single point;Educational psychologist (4 wheels) Additional Comments: Pt uses bsc next to bed and next to recliner    Prior Function            PT Goals (current goals can now be found in the care plan section) Acute Rehab PT Goals PT Goal Formulation: With patient Time For Goal Achievement: 10/17/23 Potential to Achieve Goals: Good Progress towards PT goals: Progressing toward goals    Frequency    Min 1X/week      PT Plan      Co-evaluation PT/OT/SLP Co-Evaluation/Treatment: Yes Reason for Co-Treatment: To address functional/ADL transfers;For patient/therapist safety PT goals addressed during session: Mobility/safety with mobility OT goals addressed during session: ADL's and self-care      AM-PAC PT "6 Clicks" Mobility   Outcome Measure  Help needed turning from your back to your side while in a flat bed without using bedrails?: A Little Help needed moving from lying on your back to sitting on the side of a flat bed without using bedrails?: A Lot Help needed moving to and from a bed to a chair (including a wheelchair)?: A Lot Help needed standing up from a chair using your arms (e.g., wheelchair or bedside chair)?: A Lot Help needed to walk in hospital room?: Total Help needed climbing 3-5 steps with a railing? : Total 6 Click Score: 11    End of Session Equipment Utilized During Treatment: Gait belt Activity Tolerance: Patient limited by fatigue Patient left: in chair;with  call bell/phone within reach;with nursing/sitter in room Nurse Communication: Mobility status;Other (comment) (HR and BP during session) PT Visit Diagnosis: Other abnormalities of gait and mobility (R26.89);Muscle weakness (generalized) (M62.81);History of falling (Z91.81)     Time: 1133-1203 PT Time Calculation (min) (ACUTE ONLY): 30 min  Charges:    $Therapeutic Activity: 8-22 mins PT General Charges $$ ACUTE PT VISIT: 1 Visit                     Maylon Peppers, PT, DPT Physical Therapist - [facility]  [facility]    [name] A Tara Rud 10/05/2023, 2:43 PM

## 2023-10-05 NOTE — Progress Notes (Signed)
[facility]  - [place] [facility]   PATIENT NAME: Elizabeth [name]    MR#:  [id number]  DATE OF BIRTH:  11-Mar-1951  SUBJECTIVE:  patient sitting up in the recliner chair. No family at bedside. Denies any complaints overall feeling better.    VITALS:  Blood pressure (!) 129/53, pulse 61, temperature 97.6 F (36.4 C), temperature source Oral, resp. rate 16, height 5\' 3"  (1.6 m), weight 88 kg, SpO2 100%.  PHYSICAL EXAMINATION:   GENERAL:  72 y.o.-year-old patient with no acute distress.  LUNGS: Normal breath sounds bilaterally, no wheezing CARDIOVASCULAR: S1, S2 normal. No murmur   ABDOMEN: Soft, nontender, nondistended. EXTREMITIES: chronic lymphedema NEUROLOGIC: nonfocal  patient is alert and awake  LABORATORY PANEL:  CBC Recent Labs  Lab 10/05/23 0539  WBC 10.5  HGB 12.8  HCT 37.4  PLT 161    Chemistries  Recent Labs  Lab 10/02/23 1327 10/03/23 0626 10/05/23 0539  NA 137   < > 138  K 4.5   < > 5.0  CL 109   < > 112*  CO2 16*   < > 24  GLUCOSE 89   < > 119*  BUN 54*   < > 45*  CREATININE 4.31*   < > 2.35*  CALCIUM 8.4*   < > 8.5*  MG  --    < > 1.8  AST 67*  --   --   ALT 84*  --   --   ALKPHOS 94  --   --   BILITOT 0.8  --   --    < > = values in this interval not displayed.    Assessment and Plan 72 y.o. female with medical history significant of remote history of partial seizure on Dilantin, recently diagnosed with cardiac amyloidosis, HTN, HLD, chronic A-fib on Eliquis, chronic lymphedema, IIDM, CKD stage IIIb, diabetic neuropathy, presented with worsening of generalized weakness and new onset of partial seizure    Partial seizure (Acute on Chronic) --Clinically suspect this is related to recent use of Levaquin which lowered seizure threshold as patient has remote history of partial seizure 30 years ago.   --On top of that patient has been having AKI process, as a result there might be a toxic level of Levaquin buildup lower seizure  threshold. --EEG shows cortical dysfunction arising from right hemisphere, maximal right frontal region likely secondary to underlying stroke.  No seizure or epileptiform discharges were seen. -Discontinued Levaquin, change to doxycycline for 2 more days -neurology consult-Dr. Otelia Limes seen, started Onfi (Clobazam) and stopped other AEDs - no further seizure in the Hospital  AKI on CKD stage IIIb (baseline creat 1.4--2.3) -Prerenal, clinically volume contracted with hypotension, slowly improving --came in with creat 4.31---2.35 -Hold off diuresis, hold off spironolactone and ACEI  -Avoid nephrotoxic medications.     Deconditioning -PT, OT evaluation - recommends SNF, TOC aware   Recently diagnosed cardiac amyloidosis Chronic Diastolic CHF Persistent Hypotension --Continue Tafamadis (increased dose to 80 mg) --Advance CHF team following --Echo (08/24) - EF 55-60% --Hold Diuretics--OK to resume from am lasix 20 mg --added farxiga --Continue Midodrine.   Chronic A-fib -Controlled, continue Eliquis   DM2 -SSI for now   Abnormal UA -Negative urine protein, low suspicion for renal amyloidosis at this point -Leukocytes in urine but negative for nitrite and presence of epithelial cells indicating contamination.  Patient has no symptoms UTI, monitor off antibiotics.   Chronic lymphedema with chronic ultrasound bilateral lower extremities Ankle wound on left  leg POA -Outpatient follow-up with podiatry - wound care seen for the wound as below and placed recommendations - Wound in the left leg, by the ankle Measurement: Approx. 4x4x.1 Wound bed: Red 100% - Xeroform on the wound bed, cover with foam dressing. Apply compressive therapy.   TOC for discharge planning to once insurance authorization available. Neurology has signed off. Cardiology is agreeable for  discharge.   Family communication : none today Consults : cardiology, neurology CODE STATUS: full DVT Prophylaxis :  eliquis Level of care: Progressive Status is: Inpatient Remains inpatient appropriate because: awaiting insurance authorization    TOTAL TIME TAKING CARE OF THIS PATIENT: 35 minutes.  >50% time spent on counselling and coordination of care  Note: This dictation was prepared with Dragon dictation along with smaller phrase technology. Any transcriptional errors that result from this process are unintentional.  Enedina Finner M.D    [facility]   CC: Primary care physician; Leanna Sato, MD

## 2023-10-05 NOTE — Consult Note (Signed)
WOC Nurse wound follow up; see WOC consult note 10/04/2023 Wound type: full thickness r/t venous insufficiency  Measurement: L medial lower leg 3 cm x 3 cm x 0.1 cm 100% pink moist; L anterior lower leg 4 cm x 2 cm x 0.1 cm 100% pink moist; scattered 1 cm x 0.5 cm x 0.1 cm full thickness L lateral lower leg pink moist R leg scattered areas of partial thickness skin loss most significant 3 cm x 1 cm anterior lower leg  Wound bed:as above  Drainage (amount, consistency, odor) minimal serosanguinous L leg  Periwound: dry peeling skin, evidence of healed venous ulcerations  Dressing procedure/placement/frequency: Cleansed lower leg wounds with NS, cleaned feet with soap and water.  Xeroform gauze applied to open ulcerations bilateral legs.  Applied unna boot dressing to bilateral lower legs and covered with Conform.    WOC team will follow weekly for unna boot changes as long as patient remains inpatient. One box of unopened unna boot material left in room, one box sufficient for both legs.    Thank you,    Priscella Mann MSN, RN-BC, [employer] [phone]

## 2023-10-05 NOTE — Progress Notes (Signed)
PHARMACY CONSULT NOTE - FOLLOW UP  Pharmacy Consult for Electrolyte Monitoring and Replacement   Recent Labs: Potassium (mmol/L)  Date Value  10/05/2023 5.0  11/18/2014 3.7   Magnesium (mg/dL)  Date Value  40/34/7425 1.8   Calcium (mg/dL)  Date Value  95/63/8756 8.5 (L)   Calcium, Total (mg/dL)  Date Value  43/32/9518 9.0   Albumin (g/dL)  Date Value  84/16/6063 3.2 (L)  11/18/2014 3.4   Phosphorus (mg/dL)  Date Value  01/60/1093 3.6   Sodium (mmol/L)  Date Value  10/05/2023 138  07/21/2023 142  11/18/2014 139     Assessment: 72 y.o. 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   Goal of Therapy:  Electrolytes WNL  Plan:  No electrolyte replacement warranted for today Because this consult was generated as part of an ICU order set and patient is transferring pharmacy will sign off for now Please feel free to reach out if any further assistance is needed   Lowella Bandy ,PharmD Clinical Pharmacist 10/05/2023 8:11 AM

## 2023-10-05 NOTE — Plan of Care (Signed)
  Problem: Metabolic: Goal: Ability to maintain appropriate glucose levels will improve Outcome: Progressing   Problem: Education: Goal: Knowledge of General Education information will improve Description: Including pain rating scale, medication(s)/side effects and non-pharmacologic comfort measures Outcome: Progressing   Problem: Clinical Measurements: Goal: Will remain free from infection Outcome: Progressing Goal: Respiratory complications will improve Outcome: Progressing   Problem: Coping: Goal: Ability to adjust to condition or change in health will improve Outcome: Not Progressing   Problem: Health Behavior/Discharge Planning: Goal: Ability to manage health-related needs will improve Outcome: Not Progressing   Problem: Clinical Measurements: Goal: Ability to maintain clinical measurements within normal limits will improve Outcome: Not Progressing Goal: Cardiovascular complication will be avoided Outcome: Not Progressing   Problem: Activity: Goal: Risk for activity intolerance will decrease Outcome: Not Progressing

## 2023-10-05 NOTE — Evaluation (Signed)
Occupational Therapy Re-Evaluation Patient Details Name: Elizabeth [name] MRN: [id number] DOB: 09/05/1951 Today's Date: 10/05/2023   History of Present Illness Pt is a 72 y.o. female presenting to hospital 10/02/23 with c/o possible partial seizures (L forehead and side of her head was twitching for about 5 minutes) as well as worsening weakness.  Pt admitted with partial seizure, AKI on CKD stage IIIb, deconditioning, recently diagnosed cardiac amyloidosis, abnormal UA.  Transferred to ICU 10/03/23 d/t low BP concerns.  PMH includes DM, htn, CVA with L arm weakness, a-fib on Eliquis, CHF on Lasix, cardiac amyloidosis, chronic lymphedema, CKD, diabetic neuropathy, B toe amputations, R breast lumpectomy, L eye blindness.   Clinical Impression   Pt seen for re-evaluation this date after transfer to ICU. Co-tx with PT for safety with mobility efforts. Pt required CGA for bed mobility, MINA  +2 for STS with RW, MOD A for LB dressing with episode of urinary incontinence in standing. Two episodes of nausea during session during standing/mobility requiring seated rest break. Upon initial stand and taking a few steps near EOB with pause for clean up of urine, pt endorsed nausea. Returned to sitting. BP 116/68. With seated rest break nausea subsided. Pt able to ambulate ~12' further with chair follow and CGA + RW but then endorsed more nausea. Returned to sitting. BP 85/59 HR in 30's-40's for <99min before improving to 60bpm or better. RR 18-22 throughout, SpO2 99% on room air. RN notified promptly and in room to assess pt. Pt left in recliner with RN for further assessment. Pt continues to benefit from skilled OT services. Recommendations remain appropriate.       If plan is discharge home, recommend the following: A little help with walking and/or transfers;A lot of help with bathing/dressing/bathroom;Assistance with cooking/housework;Assist for transportation;Help with stairs or ramp for entrance;Direct  supervision/assist for medications management    Functional Status Assessment  Patient has had a recent decline in their functional status and demonstrates the ability to make significant improvements in function in a reasonable and predictable amount of time.  Equipment Recommendations  Other (comment) (defer)    Recommendations for Other Services       Precautions / Restrictions Precautions Precautions: Fall Precaution Comments: seizure Restrictions Weight Bearing Restrictions: No      Mobility Bed Mobility Overal bed mobility: Needs Assistance Bed Mobility: Supine to Sit     Supine to sit: Contact guard          Transfers Overall transfer level: Needs assistance Equipment used: Rolling walker (2 wheels) Transfers: Sit to/from Stand Sit to Stand: Min assist, +2 physical assistance           General transfer comment: VC for hand placement      Balance Overall balance assessment: Needs assistance Sitting-balance support: Feet supported, Bilateral upper extremity supported Sitting balance-Leahy Scale: Good     Standing balance support: Bilateral upper extremity supported, Reliant on assistive device for balance Standing balance-Leahy Scale: Poor                             ADL either performed or assessed with clinical judgement   ADL Overall ADL's : Needs assistance/impaired             Lower Body Bathing: Sitting/lateral leans;Moderate assistance       Lower Body Dressing: Sit to/from stand;Moderate assistance               Functional mobility during  ADLs: Contact guard assist;Minimal assistance;Rolling walker (2 wheels);Cueing for safety;Cueing for sequencing;+2 for safety/equipment       Vision         Perception         Praxis         Pertinent Vitals/Pain Pain Assessment Pain Assessment: No/denies pain     Extremity/Trunk Assessment Upper Extremity Assessment Upper Extremity Assessment: Generalized  weakness   Lower Extremity Assessment Lower Extremity Assessment: Generalized weakness       Communication Communication Communication: No apparent difficulties   Cognition Arousal: Alert Behavior During Therapy: WFL for tasks assessed/performed Overall Cognitive Status: No family/caregiver present to determine baseline cognitive functioning                                 General Comments: follows commands     General Comments  v    Exercises     Shoulder Instructions      Home Living Family/patient expects to be discharged to:: Private residence Living Arrangements: Spouse/significant other;Children (Husband and son) Available Help at Discharge: Family;Available 24 hours/day Type of Home: House Home Access: Stairs to enter [employer] of Steps: 3 Entrance Stairs-Rails: Right;Left;Can reach both Home Layout: One level     Bathroom Shower/Tub: Chief Strategy Officer: Standard     Home Equipment: BSC/3in1;Cane - single point;Shower seat;Rollator (4 wheels)   Additional Comments: Pt uses bsc next to bed and next to recliner      Prior Functioning/Environment Prior Level of Function : Needs assist;History of Falls (last six months)             Mobility Comments: Pt uses SPC for short household distances.  Pt reports several recent falls (legs give out). ADLs Comments: Per OT eval "Pt requires moderate assist from spouse for toileting, bathing, and dressing and all IADL (before Aug admission, pt was supv for standing bathing, mod indep for grooming, feeding, dressing, toileting, and was managing meds)".        OT Problem List: Decreased strength;Decreased activity tolerance;Impaired balance (sitting and/or standing);Decreased knowledge of use of DME or AE;Decreased safety awareness;Cardiopulmonary status limiting activity      OT Treatment/Interventions: Self-care/ADL training;Therapeutic exercise;Therapeutic  activities;DME and/or AE instruction;Patient/family education;Energy conservation;Balance training    OT Goals(Current goals can be found in the care plan section) Acute Rehab OT Goals Patient Stated Goal: get stronger OT Goal Formulation: With patient Time For Goal Achievement: 10/19/23 Potential to Achieve Goals: Good  OT Frequency: Min 1X/week    Co-evaluation PT/OT/SLP Co-Evaluation/Treatment: Yes Reason for Co-Treatment: To address functional/ADL transfers;For patient/therapist safety PT goals addressed during session: Mobility/safety with mobility OT goals addressed during session: ADL's and self-care      AM-PAC OT "6 Clicks" Daily Activity     Outcome Measure Help from another person eating meals?: None Help from another person taking care of personal grooming?: A Little Help from another person toileting, which includes using toliet, bedpan, or urinal?: A Lot Help from another person bathing (including washing, rinsing, drying)?: A Lot Help from another person to put on and taking off regular upper body clothing?: A Little Help from another person to put on and taking off regular lower body clothing?: A Lot 6 Click Score: 16   End of Session Equipment Utilized During Treatment: Rolling walker (2 wheels) Nurse Communication: Mobility status;Other (comment) (BP, HR)  Activity Tolerance: Patient tolerated treatment well Patient left: in chair;with call  bell/phone within reach;with nursing/sitter in room  OT Visit Diagnosis: Other abnormalities of gait and mobility (R26.89);Repeated falls (R29.6);Muscle weakness (generalized) (M62.81)                Time: 2440-1027 OT Time Calculation (min): 29 min Charges:  OT General Charges $OT Visit: 1 Visit OT Evaluation $OT Re-eval: 1 Re-eval OT Treatments $Self Care/Home Management : 8-22 mins  Arman Filter., MPH, MS, OTR/L ascom [phone] 10/05/23, 2:06 PM

## 2023-10-05 NOTE — Progress Notes (Signed)
Patient ID: Elizabeth [name], female   DOB: [birth year], 72 y.o.   MRN: [id number]     Advanced Heart Failure Rounding Note  PCP-Cardiologist: Debbe Odea, MD   Subjective:    SBP stable in 100s today on midodrine 5 tid. UOP 1450, creatinine down to 2.35.   No complaints, denies dyspnea.   Objective:   Weight Range: 88 kg Body mass index is 34.37 kg/m.   Vital Signs:   Temp:  [97.7 F (36.5 C)-98.2 F (36.8 C)] 97.9 F (36.6 C) (11/27 0400) Pulse Rate:  [35-56] 56 (11/27 0536) Resp:  [15-20] 15 (11/27 0536) BP: (94-131)/(45-96) 95/51 (11/27 0400) SpO2:  [96 %-100 %] 99 % (11/27 0536) Last BM Date : 10/04/23  Weight change: Filed Weights   10/02/23 1318  Weight: 88 kg    Intake/Output:   Intake/Output Summary (Last 24 hours) at 10/05/2023 0845 Last data filed at 10/05/2023 0400 Gross per 24 hour  Intake 300 ml  Output 1050 ml  Net -750 ml      Physical Exam    General: NAD Neck: No JVD, no thyromegaly or thyroid nodule.  Lungs: Clear to auscultation bilaterally with normal respiratory effort. CV: Nondisplaced PMI.  Heart irregular S1/S2, no S3/S4, 1/6 SEM RUSB.  Lower legs wrapped.   Abdomen: Soft, nontender, no hepatosplenomegaly, no distention.  Skin: Intact without lesions or rashes.  Neurologic: Alert and oriented x 3.  Psych: Normal affect. Extremities: No clubbing or cyanosis.  HEENT: Normal.   Telemetry   Atrial fibrillation 60s (Personally reviewed)    EKG    No new EKG to review  Labs    CBC Recent Labs    10/04/23 0436 10/05/23 0539  WBC 10.8* 10.5  HGB 11.9* 12.8  HCT 34.2* 37.4  MCV 84.7 85.0  PLT 151 161   Basic Metabolic Panel Recent Labs    78/29/56 0436 10/05/23 0539  NA 139 138  K 4.3 5.0  CL 112* 112*  CO2 22 24  GLUCOSE 122* 119*  BUN 52* 45*  CREATININE 3.11* 2.35*  CALCIUM 7.9* 8.5*  MG 1.3* 1.8  PHOS 3.6  --    Liver Function Tests Recent Labs    10/02/23 1327  AST 67*  ALT 84*  ALKPHOS 94   BILITOT 0.8  PROT 6.4*  ALBUMIN 3.2*   No results for input(s): "LIPASE", "AMYLASE" in the last 72 hours. Cardiac Enzymes No results for input(s): "CKTOTAL", "CKMB", "CKMBINDEX", "TROPONINI" in the last 72 hours.  BNP: BNP (last 3 results) Recent Labs    07/02/23 2016 10/02/23 1327  BNP 670.1* 116.7*    ProBNP (last 3 results) No results for input(s): "PROBNP" in the last 8760 hours.   D-Dimer No results for input(s): "DDIMER" in the last 72 hours. Hemoglobin A1C No results for input(s): "HGBA1C" in the last 72 hours. Fasting Lipid Panel No results for input(s): "CHOL", "HDL", "LDLCALC", "TRIG", "CHOLHDL", "LDLDIRECT" in the last 72 hours. Thyroid Function Tests No results for input(s): "TSH", "T4TOTAL", "T3FREE", "THYROIDAB" in the last 72 hours.  Invalid input(s): "FREET3"  Other results:   Imaging    No results found.   Medications:     Scheduled Medications:  apixaban  5 mg Oral BID   Chlorhexidine Gluconate Cloth  6 each Topical Daily   cloBAZam  5 mg Oral BID   dapagliflozin propanediol  10 mg Oral Daily   gabapentin  300 mg Oral Daily   insulin aspart  0-9 Units Subcutaneous TID WC  midodrine  5 mg Oral TID WC   pravastatin  40 mg Oral Daily   sodium bicarbonate  650 mg Oral BID   sodium chloride flush  10 mL Intravenous Q12H   Tafamidis Meglumine (Cardiac)  80 mg Oral Daily    Infusions:    PRN Medications: acetaminophen, mouth rinse, mouth rinse    Patient Profile   Elizabeth [name] is a 72 y.o. 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 AHF team to see for cardiac amyloid.   Assessment/Plan   1. Chronic diastolic CHF:  Last  echo in 8/24 with EF 55-60%, normal RV.  She had a PYP scan in 10/24 read as grade 2, suggestive of cardiac amyloidosis though H/CL not markedly elevated at 1.32.  She was started on tafamidis. Recently had Lasix increased, now admitted with AKI.  She has been off diuretics, volume status appears stable today.  Creatinine trending down, 2.35 today.   She has lymphedema which makes assessment of volume difficult.   - Can hold off on Lasix today, probably restart on 20 mg daily tomorrow.  - Today, will start Farxiga 10 mg daily.  - Continue midodrine 5 mg tid, possible autonomic neuropathy with amyloidosis.  - Continue tafamidis. Consider eventual cardiac MRI to firm up diagnosis of cardiac amyloidosis - Would consider eventual Cardiomems placement to help with volume management as exam is difficult.  2. AKI on CKD stage 3: Baseline creatinine 1.28, up to 4.3 at admission.  Suspect overdiuresis. Creatinine trending down, 2.35 today with holding diuretics.  - Adding Farxiga as above.  3. Hypotension: SBP now in 100s. She is asymptomatic.  Possible component of autonomic neuropathy from amyloidosis.  - Continue midodrine as above.  4. Atrial fibrillation: Rate currently stable in 60s.  Chronic.  Suspect component of sick sinus syndrome with bradycardia though has been stable in hospital.  - Continue apixaban.  - No nodal blockade with sick sinus syndrome.  - May eventually need PPM.  5. Seizure: h/o prior seizures.  Suspect threshold for seizures lowered with levofloxacin use + AKI.   - Neurology following, meds adjusted 6. Wound infection: Venous stasis ulcerations with ?infection.  - Levofloxacin stopped  Mobilize, can go to telemetry.  Awaiting SNF.  Length of Stay: 2  Marca Ancona, MD  10/05/2023, 8:45 AM  Advanced Heart Failure Team Pager [phone] (M-F; 7a - 5p)  Please contact [facility] for night-coverage after hours (5p -7a ) and weekends on [email or web address]

## 2023-10-05 NOTE — TOC Progression Note (Addendum)
Transition of Care [facility]) - Progression Note    Patient Details  Name: Elizabeth [name] MRN: [id number] Date of Birth: Jan 09, 1951  Transition [facility]) CM/SW Contact  Margarito Liner, LCSW Phone Number: 10/05/2023, 2:11 PM  Clinical Narrative:   CSW reviewed bed offers with patient. She has accepted [facility]. CSW left message for admissions coordinator to notify.  3:25 pm: CSW started insurance authorization. [facility] can take her today if auth obtained. If not, their pharmacy will be closed tomorrow for the Thanksgiving holiday so discharge would be postponed until Friday.  Expected Discharge Plan: Skilled Nursing Facility Barriers to Discharge: Continued Medical Work up  Expected Discharge Plan and Services     Post Acute Care Choice: Skilled Nursing Facility Living arrangements for the past 2 months: Single Family Home                                       Social Determinants of Health (SDOH) Interventions SDOH Screenings   Food Insecurity: No Food Insecurity (10/03/2023)  Housing: Low Risk  (10/03/2023)  Transportation Needs: No Transportation Needs (10/03/2023)  Utilities: Not At Risk (10/03/2023)  Tobacco Use: Low Risk  (10/03/2023)    Readmission Risk Interventions    10/04/2023    1:13 PM  Readmission Risk Prevention Plan  PCP or Specialist Appt within 3-5 Days Complete  Social Work Consult for Recovery Care Planning/Counseling Complete  Palliative Care Screening Not Applicable

## 2023-10-06 DIAGNOSIS — N289 Disorder of kidney and ureter, unspecified: Secondary | ICD-10-CM | POA: Diagnosis not present

## 2023-10-06 DIAGNOSIS — E854 Organ-limited amyloidosis: Secondary | ICD-10-CM | POA: Diagnosis not present

## 2023-10-06 DIAGNOSIS — I5032 Chronic diastolic (congestive) heart failure: Secondary | ICD-10-CM | POA: Diagnosis not present

## 2023-10-06 DIAGNOSIS — R569 Unspecified convulsions: Secondary | ICD-10-CM | POA: Diagnosis not present

## 2023-10-06 DIAGNOSIS — R531 Weakness: Secondary | ICD-10-CM | POA: Diagnosis not present

## 2023-10-06 LAB — BASIC METABOLIC PANEL
Anion gap: 9 (ref 5–15)
BUN: 43 mg/dL — ABNORMAL HIGH (ref 8–23)
CO2: 20 mmol/L — ABNORMAL LOW (ref 22–32)
Calcium: 8.1 mg/dL — ABNORMAL LOW (ref 8.9–10.3)
Chloride: 108 mmol/L (ref 98–111)
Creatinine, Ser: 2.13 mg/dL — ABNORMAL HIGH (ref 0.44–1.00)
GFR, Estimated: 24 mL/min — ABNORMAL LOW (ref >60)
Glucose, Bld: 100 mg/dL — ABNORMAL HIGH (ref 70–99)
Potassium: 3.8 mmol/L (ref 3.5–5.1)
Sodium: 137 mmol/L (ref 135–145)

## 2023-10-06 LAB — GLUCOSE, CAPILLARY
Glucose-Capillary: 83 mg/dL (ref 70–99)
Glucose-Capillary: 158 mg/dL — ABNORMAL HIGH (ref 70–99)
Glucose-Capillary: 131 mg/dL — ABNORMAL HIGH (ref 70–99)
Glucose-Capillary: 150 mg/dL — ABNORMAL HIGH (ref 70–99)

## 2023-10-06 NOTE — Plan of Care (Signed)
Progressing towards goals

## 2023-10-06 NOTE — Progress Notes (Signed)
Rounding Note    Patient Name: Elizabeth [name] Date of Encounter: 10/06/2023  [facility] Cardiologist: Debbe Odea, MD   Subjective   Laying supine, reports feeling well no complaints Sat up in a chair yesterday, walked short distance with PT Nausea yesterday with heart rates dropping into the 30s, hypotension documented 85/59 Blood pressure improved at the end of PT session 120s over 60s  Inpatient Medications    Scheduled Meds:  apixaban  5 mg Oral BID   Chlorhexidine Gluconate Cloth  6 each Topical Daily   cloBAZam  5 mg Oral BID   dapagliflozin propanediol  10 mg Oral Daily   furosemide  20 mg Oral Daily   gabapentin  300 mg Oral Daily   insulin aspart  0-9 Units Subcutaneous TID WC   midodrine  5 mg Oral TID WC   pravastatin  40 mg Oral Daily   sodium bicarbonate  650 mg Oral BID   sodium chloride flush  10 mL Intravenous Q12H   Tafamidis Meglumine (Cardiac)  80 mg Oral Daily   Continuous Infusions:  PRN Meds: acetaminophen, mouth rinse, mouth rinse   Vital Signs    Vitals:   10/05/23 2200 10/06/23 0000 10/06/23 0456 10/06/23 0735  BP: 101/73 106/74 (!) 90/51 (!) 90/55  Pulse: (!) 50 (!) 53 (!) 55 (!) 59  Resp: 18 16 16 16   Temp:   97.8 F (36.6 C) 98.1 F (36.7 C)  TempSrc:      SpO2: 100% 99% 98% 97%  Weight:      Height:        Intake/Output Summary (Last 24 hours) at 10/06/2023 1057 Last data filed at 10/06/2023 0738 Gross per 24 hour  Intake 480 ml  Output 975 ml  Net -495 ml      10/02/2023    1:18 PM 09/23/2023   10:29 AM 09/13/2023    1:46 PM  Last 3 Weights  Weight (lbs) 194 lb 192 lb 195 lb  Weight (kg) 87.998 kg 87.091 kg 88.451 kg      Telemetry    Normal sinus rhythm- Personally Reviewed  ECG     - Personally Reviewed  Physical Exam   GEN: No acute distress.   Neck: No JVD Cardiac: RRR, no murmurs, rubs, or gallops.  Respiratory: Clear to auscultation bilaterally. GI: Soft, nontender,  non-distended  MS: No edema; No deformity. Neuro:  Nonfocal  Psych: Normal affect   Labs    High Sensitivity Troponin:   Recent Labs  Lab 10/02/23 1337  TROPONINIHS 10     Chemistry Recent Labs  Lab 10/02/23 1327 10/03/23 0626 10/04/23 0436 10/05/23 0539 10/06/23 0928  NA 137   < > 139 138 137  K 4.5   < > 4.3 5.0 3.8  CL 109   < > 112* 112* 108  CO2 16*   < > 22 24 20*  GLUCOSE 89   < > 122* 119* 100*  BUN 54*   < > 52* 45* 43*  CREATININE 4.31*   < > 3.11* 2.35* 2.13*  CALCIUM 8.4*   < > 7.9* 8.5* 8.1*  MG  --   --  1.3* 1.8  --   PROT 6.4*  --   --   --   --   ALBUMIN 3.2*  --   --   --   --   AST 67*  --   --   --   --   ALT 84*  --   --   --   --  ALKPHOS 94  --   --   --   --   BILITOT 0.8  --   --   --   --   GFRNONAA 10*   < > 15* 22* 24*  ANIONGAP 12   < > 5 2* 9   < > = values in this interval not displayed.    Lipids No results for input(s): "CHOL", "TRIG", "HDL", "LABVLDL", "LDLCALC", "CHOLHDL" in the last 168 hours.  Hematology Recent Labs  Lab 10/02/23 1327 10/04/23 0436 10/05/23 0539  WBC 11.3* 10.8* 10.5  RBC 4.44 4.04 4.40  HGB 12.9 11.9* 12.8  HCT 38.9 34.2* 37.4  MCV 87.6 84.7 85.0  MCH 29.1 29.5 29.1  MCHC 33.2 34.8 34.2  RDW 14.9 14.9 14.8  PLT 180 151 161   Thyroid No results for input(s): "TSH", "FREET4" in the last 168 hours.  BNP Recent Labs  Lab 10/02/23 1327  BNP 116.7*    DDimer No results for input(s): "DDIMER" in the last 168 hours.   Radiology    No results found.  Cardiac Studies   Echo August 2024 Left ventricular ejection fraction, by estimation, is 55 to 60%. The  left ventricle has normal function. The left ventricle has no regional  wall motion abnormalities. Left ventricular diastolic parameters are  indeterminate.   2. Right ventricular systolic function is normal. The right ventricular  size is mildly enlarged.   3. The mitral valve is normal in structure. Mild mitral valve  regurgitation.   4.  The aortic valve is tricuspid. Aortic valve regurgitation is not  visualized.   5. The inferior vena cava is normal in size with <50% respiratory  variability, suggesting right atrial pressure of 8 mmHg.    Patient Profile     72 y.o. female   Assessment & Plan    1. Chronic diastolic CHF:   Last echo in 8/24 with EF 55-60%, normal RV.    PYP scan in 10/24 read as grade 2, suggestive of cardiac amyloidosis though H/CL not markedly elevated at 1.32.   started on tafamidis.   Lasix increased as outpatient, admitted with AKI.  off diuretics,Creatinine trending down, 2.13 today.   Underlying lymphedema which makes assessment of volume difficult.   -On Farxiga 10 mg daily, restart Lasix 20 daily Monitor blood pressure, on midodrine 5 mg tid for hypotension,  May need to increase midodrine up to 10 mg 3 times daily for positive orthostatics - Continue tafamidis.  Output patient cardiac MRI to confirm cardiac amyloidosis -CHF clinic to consider Cardiomems placement to help with volume management   2. AKI on CKD stage 3:  Baseline creatinine 1.28, up to 4.3 at admission.  Suspect overdiuresis. Creatinine trending down, 2.13 today On Farxiga with Lasix 20 starting today  3. Hypotension: SBP now in 100s.  Vagal event yesterday working with PT in the setting of nausea, hypotensive systolics into the 80s improved up to 120s in recovery Concern for possible autonomic dysfunction from amyloidosis.  - Continue midodrine as above.  May need to increase up to 10 3 times daily for symptomatic hypotension  4. Atrial fibrillation:  Rate currently stable in 60s.  Starting in August 2024.   Notes indicating history of bradycardia, suspected sick sinus syndrome  - Continue apixaban.  - No nodal blockade with sick sinus syndrome.  - May eventually need PPM.  Not a good candidate for AV nodal blocker agents or antiarrhythmics in effort to restore normal sinus rhythm  5.  Seizure: h/o prior seizures.    Suspect threshold for seizures lowered with levofloxacin use + AKI.   - Neurology following, meds adjusted  6. Wound infection:  Venous stasis ulcerations with ?infection.  - Levofloxacin stopped   Close monitoring of orthostatics, awaiting SNF.  For questions or updates, please contact [facility] Please consult [email or web address] for contact info under        Signed, Julien Nordmann, MD  10/06/2023, 10:57 AM

## 2023-10-06 NOTE — Progress Notes (Signed)
[facility]  - [place] [facility]   PATIENT NAME: Elizabeth [name]    MR#:  [id number]  DATE OF BIRTH:  08/17/1951  SUBJECTIVE:  patient sitting up in the recliner chair. No family at bedside. Denies any complaints overall feeling better. Motivated to sit out in the chair.   VITALS:  Blood pressure (!) 104/58, pulse 64, temperature 98.3 F (36.8 C), temperature source Oral, resp. rate 20, height 5\' 3"  (1.6 m), weight 88 kg, SpO2 100%.  PHYSICAL EXAMINATION:   GENERAL:  72 y.o.-year-old patient with no acute distress.  LUNGS: Normal breath sounds bilaterally, no wheezing CARDIOVASCULAR: S1, S2 normal. No murmur   ABDOMEN: Soft, nontender, nondistended. EXTREMITIES: chronic lymphedema NEUROLOGIC: nonfocal  patient is alert and awake  LABORATORY PANEL:  CBC Recent Labs  Lab 10/05/23 0539  WBC 10.5  HGB 12.8  HCT 37.4  PLT 161    Chemistries  Recent Labs  Lab 10/02/23 1327 10/03/23 0626 10/05/23 0539 10/06/23 0928  NA 137   < > 138 137  K 4.5   < > 5.0 3.8  CL 109   < > 112* 108  CO2 16*   < > 24 20*  GLUCOSE 89   < > 119* 100*  BUN 54*   < > 45* 43*  CREATININE 4.31*   < > 2.35* 2.13*  CALCIUM 8.4*   < > 8.5* 8.1*  MG  --    < > 1.8  --   AST 67*  --   --   --   ALT 84*  --   --   --   ALKPHOS 94  --   --   --   BILITOT 0.8  --   --   --    < > = values in this interval not displayed.    Assessment and Plan 72 y.o. female with medical history significant of remote history of partial seizure on Dilantin, recently diagnosed with cardiac amyloidosis, HTN, HLD, chronic A-fib on Eliquis, chronic lymphedema, IIDM, CKD stage IIIb, diabetic neuropathy, presented with worsening of generalized weakness and new onset of partial seizure    Partial seizure (Acute on Chronic) --Clinically suspect this is related to recent use of Levaquin which lowered seizure threshold as patient has remote history of partial seizure 30 years ago.   --On top of that  patient has been having AKI process, as a result there might be a toxic level of Levaquin buildup lower seizure threshold. --EEG shows cortical dysfunction arising from right hemisphere, maximal right frontal region likely secondary to underlying stroke.  No seizure or epileptiform discharges were seen. -Discontinued Levaquin, change to doxycycline for 2 more days -neurology consult-Dr. Otelia Limes seen, started Onfi (Clobazam) and stopped other AEDs - no further seizure in the Hospital  AKI on CKD stage IIIb (baseline creat 1.4--2.3) -Prerenal, clinically volume contracted with hypotension, slowly improving --came in with creat 4.31---2.35--2.13 -Held lasix, spironolactone and ACEI--Resuming lasix now  -Avoid nephrotoxic medications.     Deconditioning -PT, OT evaluation - recommends SNF, TOC aware   Recently diagnosed cardiac amyloidosis Chronic Diastolic CHF Persistent Hypotension --Continue Tafamadis (increased dose to 80 mg) --Advance CHF team following --Echo (08/24) - EF 55-60% --OK to resume  lasix 20 mg every day now since creat improving --added farxiga --Continue Midodrine.   Chronic A-fib -Controlled, continue Eliquis   DM2 -SSI for now   Abnormal UA -Negative urine protein, low suspicion for renal amyloidosis at this point -Leukocytes in urine  but negative for nitrite and presence of epithelial cells indicating contamination.  Patient has no symptoms UTI, monitor off antibiotics.   Chronic lymphedema with chronic ultrasound bilateral lower extremities Ankle wound on left leg POA -Outpatient follow-up with podiatry - wound care seen for the wound and follow recommendations   TOC for discharge planning to once insurance authorization available. Neurology has signed off. Cardiology is agreeable for  discharge.   Family communication : none today--pt informs me that family is aware Consults : cardiology, neurology CODE STATUS: full DVT Prophylaxis : eliquis Level  of care: Progressive Status is: Inpatient Remains inpatient appropriate because: awaiting insurance authorization    TOTAL TIME TAKING CARE OF THIS PATIENT: 35 minutes.  >50% time spent on counselling and coordination of care  Note: This dictation was prepared with Dragon dictation along with smaller phrase technology. Any transcriptional errors that result from this process are unintentional.  Enedina Finner M.D    [facility]   CC: Primary care physician; Leanna Sato, MD

## 2023-10-07 DIAGNOSIS — E854 Organ-limited amyloidosis: Secondary | ICD-10-CM | POA: Diagnosis not present

## 2023-10-07 DIAGNOSIS — N289 Disorder of kidney and ureter, unspecified: Secondary | ICD-10-CM | POA: Diagnosis not present

## 2023-10-07 DIAGNOSIS — R569 Unspecified convulsions: Secondary | ICD-10-CM | POA: Diagnosis not present

## 2023-10-07 DIAGNOSIS — I5032 Chronic diastolic (congestive) heart failure: Secondary | ICD-10-CM | POA: Diagnosis not present

## 2023-10-07 DIAGNOSIS — N179 Acute kidney failure, unspecified: Secondary | ICD-10-CM | POA: Diagnosis not present

## 2023-10-07 LAB — BASIC METABOLIC PANEL
Anion gap: 8 (ref 5–15)
BUN: 45 mg/dL — ABNORMAL HIGH (ref 8–23)
CO2: 21 mmol/L — ABNORMAL LOW (ref 22–32)
Calcium: 8 mg/dL — ABNORMAL LOW (ref 8.9–10.3)
Chloride: 102 mmol/L (ref 98–111)
Creatinine, Ser: 2.01 mg/dL — ABNORMAL HIGH (ref 0.44–1.00)
GFR, Estimated: 26 mL/min — ABNORMAL LOW (ref >60)
Glucose, Bld: 243 mg/dL — ABNORMAL HIGH (ref 70–99)
Potassium: 4.2 mmol/L (ref 3.5–5.1)
Sodium: 131 mmol/L — ABNORMAL LOW (ref 135–145)

## 2023-10-07 LAB — GLUCOSE, CAPILLARY
Glucose-Capillary: 100 mg/dL — ABNORMAL HIGH (ref 70–99)
Glucose-Capillary: 113 mg/dL — ABNORMAL HIGH (ref 70–99)
Glucose-Capillary: 115 mg/dL — ABNORMAL HIGH (ref 70–99)
Glucose-Capillary: 144 mg/dL — ABNORMAL HIGH (ref 70–99)
Glucose-Capillary: 164 mg/dL — ABNORMAL HIGH (ref 70–99)

## 2023-10-07 NOTE — Progress Notes (Signed)
Occupational Therapy Treatment Patient Details Name: Elizabeth [name] MRN: [id number] DOB: 08/11/1951 Today's Date: 10/07/2023   History of present illness Pt is a 72 y.o. female presenting to hospital 10/02/23 with c/o possible partial seizures (L forehead and side of her head was twitching for about 5 minutes) as well as worsening weakness.  Pt admitted with partial seizure, AKI on CKD stage IIIb, deconditioning, recently diagnosed cardiac amyloidosis, abnormal UA.  Transferred to ICU 10/03/23 d/t low BP concerns.  PMH includes DM, htn, CVA with L arm weakness, a-fib on Eliquis, CHF on Lasix, cardiac amyloidosis, chronic lymphedema, CKD, diabetic neuropathy, B toe amputations, R breast lumpectomy, L eye blindness.   OT comments  Pt is seated in recliner on arrival. Pleasant and agreeable to OT session. She denies pain. Pt performed STS from recliner with CGA/Min A d/t initial posterior lean and mult rocking attempts to reach standing position. Ambulated to the bathroom and additional 4 feet to the door using RW with CGA, no LOB. Transferred to toilet with CGA, use of grab bar and cueing for hand placement. SUP for hygiene and CGA for clothing management. HR WFL throughout session. Pt returned to recliner with all needs in place and will cont to require skilled acute OT services to maximize her safety and IND to return to PLOF.       If plan is discharge home, recommend the following:  A little help with walking and/or transfers;A lot of help with bathing/dressing/bathroom;Assistance with cooking/housework;Assist for transportation;Help with stairs or ramp for entrance;Direct supervision/assist for medications management   Equipment Recommendations  Other (comment) (defer)    Recommendations for Other Services      Precautions / Restrictions Precautions Precautions: Fall Restrictions Weight Bearing Restrictions: No       Mobility Bed Mobility               General bed mobility  comments: NT pt in recliner pre/post session    Transfers Overall transfer level: Needs assistance Equipment used: Rolling walker (2 wheels) Transfers: Sit to/from Stand Sit to Stand: Contact guard assist, Min assist           General transfer comment: Min/CGA for STS from recliner with mult rocking trials before reaching standing; ambulated to BR and back using RW with CGA with additional ~4 feet to the door     Balance Overall balance assessment: Needs assistance Sitting-balance support: Feet supported, Bilateral upper extremity supported Sitting balance-Leahy Scale: Good     Standing balance support: Bilateral upper extremity supported, Reliant on assistive device for balance Standing balance-Leahy Scale: Poor Standing balance comment: mild posterior lean with initial stand from recliner, then improved with time and mobility                           ADL either performed or assessed with clinical judgement   ADL Overall ADL's : Needs assistance/impaired                         Toilet Transfer: Contact guard assist;Grab bars;Ambulation;Regular Toilet;Rolling walker (2 wheels)   Toileting- Clothing Manipulation and Hygiene: Sit to/from stand;Sitting/lateral lean;Contact guard assist       Functional mobility during ADLs: Contact guard assist;Rolling walker (2 wheels)      Extremity/Trunk Assessment Upper Extremity Assessment Upper Extremity Assessment: Generalized weakness   Lower Extremity Assessment Lower Extremity Assessment: Generalized weakness   Cervical / Trunk Assessment Cervical / Trunk Exceptions:  forward head/shoulders    Vision       Perception     Praxis      Cognition Arousal: Alert Behavior During Therapy: WFL for tasks assessed/performed Overall Cognitive Status: No family/caregiver present to determine baseline cognitive functioning                                 General Comments: follows commands         Exercises      Shoulder Instructions       General Comments      Pertinent Vitals/ Pain       Pain Assessment Pain Assessment: No/denies pain  Home Living                                          Prior Functioning/Environment              Frequency  Min 1X/week        Progress Toward Goals  OT Goals(current goals can now be found in the care plan section)  Progress towards OT goals: Progressing toward goals  Acute Rehab OT Goals Patient Stated Goal: improve strength OT Goal Formulation: With patient Time For Goal Achievement: 10/19/23 Potential to Achieve Goals: Good  Plan      Co-evaluation                 AM-PAC OT "6 Clicks" Daily Activity     Outcome Measure   Help from another person eating meals?: None Help from another person taking care of personal grooming?: A Little Help from another person toileting, which includes using toliet, bedpan, or urinal?: A Lot Help from another person bathing (including washing, rinsing, drying)?: A Lot Help from another person to put on and taking off regular upper body clothing?: A Little Help from another person to put on and taking off regular lower body clothing?: A Lot 6 Click Score: 16    End of Session Equipment Utilized During Treatment: Rolling walker (2 wheels)  OT Visit Diagnosis: Other abnormalities of gait and mobility (R26.89);Repeated falls (R29.6);Muscle weakness (generalized) (M62.81)   Activity Tolerance Patient tolerated treatment well   Patient Left in chair;with call bell/phone within reach;with nursing/sitter in room   Nurse Communication          Time: 5784-6962 OT Time Calculation (min): 31 min  Charges: OT General Charges $OT Visit: 1 Visit OT Treatments $Self Care/Home Management : 23-37 mins  Elizabeth [name], OTR/L  10/07/23, 4:20 PM   Elizabeth [name] 10/07/2023, 4:17 PM

## 2023-10-07 NOTE — Progress Notes (Signed)
Physical Therapy Treatment Patient Details Name: Elizabeth [name] MRN: [id number] DOB: February 10, 1951 Today's Date: 10/07/2023   History of Present Illness Pt is a 72 y.o. female presenting to hospital 10/02/23 with c/o possible partial seizures (L forehead and side of her head was twitching for about 5 minutes) as well as worsening weakness.  Pt admitted with partial seizure, AKI on CKD stage IIIb, deconditioning, recently diagnosed cardiac amyloidosis, abnormal UA.  Transferred to ICU 10/03/23 d/t low BP concerns.  PMH includes DM, htn, CVA with L arm weakness, a-fib on Eliquis, CHF on Lasix, cardiac amyloidosis, chronic lymphedema, CKD, diabetic neuropathy, B toe amputations, R breast lumpectomy, L eye blindness.    PT Comments  On arrival pt eating dinner but eager to try prolonged walk with PT and quick to cover tray and walk.  She ultimately did quite well and though she did have to stop due to fatigue at ~100 ft was able to improve on initial cadence consistency and confidence and maintained HR in the 60-70s range and O2 in the high 90s with the effort.  Pt with no overt safety issues or LOBs, will benefit from continued PT to address functional limitations and work toward safe d/c planning.      If plan is discharge home, recommend the following: Two people to help with walking and/or transfers;A lot of help with bathing/dressing/bathroom;Assistance with cooking/housework;Assist for transportation;Help with stairs or ramp for entrance   Can travel by private vehicle     Yes  Equipment Recommendations   (TBD)    Recommendations for Other Services       Precautions / Restrictions Precautions Precautions: Fall Restrictions Weight Bearing Restrictions: No     Mobility  Bed Mobility               General bed mobility comments: NT pt in recliner pre/post session    Transfers Overall transfer level: Needs assistance Equipment used: Rolling walker (2 wheels) Transfers: Sit  to/from Stand Sit to Stand: Contact guard assist, Min assist           General transfer comment: Pt showed great effort with forward lean and use of UEs, needing very little assist to insure she kept weight forward during sit to stand, good control on return to sitting w/o assist    Ambulation/Gait Ambulation/Gait assistance: Contact guard assist Gait Distance (Feet): 100 Feet Assistive device: Rolling walker (2 wheels)         General Gait Details: Pt with initially very slow (but safe) gait with appropriate UE use on walker, cuing and encouargement led to increased confidence and consistency of cadence.  Pt reports mod fatigue at ~100 and requests to sit, HR in the 70s O2 in the high 90s   Stairs             Wheelchair Mobility     Tilt Bed    Modified Rankin (Stroke Patients Only)       Balance Overall balance assessment: Needs assistance Sitting-balance support: Feet supported, Bilateral upper extremity supported Sitting balance-Leahy Scale: Good     Standing balance support: Bilateral upper extremity supported, Reliant on assistive device for balance Standing balance-Leahy Scale: Fair Standing balance comment: no LOBs during ambulation, appropriate use of UEs on walker                            Cognition Arousal: Alert Behavior During Therapy: WFL for tasks assessed/performed, Flat affect Overall Cognitive Status:  No family/caregiver present to determine baseline cognitive functioning                                          Exercises      General Comments        Pertinent Vitals/Pain Pain Assessment Pain Assessment: No/denies pain    Home Living                          Prior Function            PT Goals (current goals can now be found in the care plan section) Progress towards PT goals: Progressing toward goals    Frequency    Min 1X/week      PT Plan      Co-evaluation               AM-PAC PT "6 Clicks" Mobility   Outcome Measure  Help needed turning from your back to your side while in a flat bed without using bedrails?: A Little Help needed moving from lying on your back to sitting on the side of a flat bed without using bedrails?: A Lot Help needed moving to and from a bed to a chair (including a wheelchair)?: A Little Help needed standing up from a chair using your arms (e.g., wheelchair or bedside chair)?: A Little Help needed to walk in hospital room?: A Little Help needed climbing 3-5 steps with a railing? : A Lot 6 Click Score: 16    End of Session Equipment Utilized During Treatment: Gait belt Activity Tolerance: Patient limited by fatigue Patient left: in chair;with call bell/phone within reach;with nursing/sitter in room Nurse Communication: Mobility status PT Visit Diagnosis: Other abnormalities of gait and mobility (R26.89);Muscle weakness (generalized) (M62.81);History of falling (Z91.81)     Time: 1610-9604 PT Time Calculation (min) (ACUTE ONLY): 17 min  Charges:    $Gait Training: 8-22 mins PT General Charges $$ ACUTE PT VISIT: 1 Visit                     Keishaun Hazel R Alieah Brinton. DPT 10/07/2023, 5:53 PM

## 2023-10-07 NOTE — TOC Progression Note (Signed)
Transition of Care [facility]) - Progression Note    Patient Details  Name: ARIELE TOMITA MRN: [id number] Date of Birth: [birth year]  Transition of Care [facility]) CM/SW Contact  Truddie Hidden, RN Phone Number: 10/07/2023, 3:46 PM  Clinical Narrative:    [facility] still pending per [facility] portal MD notified.    Expected Discharge Plan: Skilled Nursing Facility Barriers to Discharge: Continued Medical Work up  Expected Discharge Plan and Services     Post Acute Care Choice: Skilled Nursing Facility Living arrangements for the past 2 months: Single Family Home                                       Social Determinants of Health (SDOH) Interventions SDOH Screenings   Food Insecurity: No Food Insecurity (10/03/2023)  Housing: Low Risk  (10/03/2023)  Transportation Needs: No Transportation Needs (10/03/2023)  Utilities: Not At Risk (10/03/2023)  Tobacco Use: Low Risk  (10/03/2023)    Readmission Risk Interventions    10/04/2023    1:13 PM  Readmission Risk Prevention Plan  PCP or Specialist Appt within 3-5 Days Complete  Social Work Consult for Recovery Care Planning/Counseling Complete  Palliative Care Screening Not Applicable

## 2023-10-07 NOTE — Plan of Care (Signed)
  Problem: Metabolic: Goal: Ability to maintain appropriate glucose levels will improve Outcome: Progressing   Problem: Nutritional: Goal: Maintenance of adequate nutrition will improve Outcome: Progressing   Problem: Skin Integrity: Goal: Risk for impaired skin integrity will decrease Outcome: Progressing   Problem: Tissue Perfusion: Goal: Adequacy of tissue perfusion will improve Outcome: Progressing   Problem: Clinical Measurements: Goal: Ability to maintain clinical measurements within normal limits will improve Outcome: Progressing   Problem: Nutrition: Goal: Adequate nutrition will be maintained Outcome: Progressing   Problem: Pain Management: Goal: General experience of comfort will improve Outcome: Progressing   Problem: Safety: Goal: Ability to remain free from injury will improve Outcome: Progressing

## 2023-10-07 NOTE — Progress Notes (Signed)
[facility]  - [place] at [facility]   PATIENT NAME: Elizabeth [name]    MR#:  [id number]  DATE OF BIRTH:  [birth year]  SUBJECTIVE:  patient sitting up in the recliner chair. No family at bedside. Denies any complaints overall feeling better.    VITALS:  Blood pressure 106/63, pulse (!) 57, temperature 97.7 F (36.5 C), resp. rate 16, height 5\' 3"  (1.6 m), weight 88 kg, SpO2 100%.  PHYSICAL EXAMINATION:   GENERAL:  72 y.o.-year-old patient with no acute distress.  LUNGS: Normal breath sounds bilaterally, no wheezing CARDIOVASCULAR: S1, S2 normal. No murmur   ABDOMEN: Soft, nontender, nondistended. EXTREMITIES: chronic lymphedema NEUROLOGIC: nonfocal  patient is alert and awake  LABORATORY PANEL:  CBC Recent Labs  Lab 10/05/23 0539  WBC 10.5  HGB 12.8  HCT 37.4  PLT 161    Chemistries  Recent Labs  Lab 10/02/23 1327 10/03/23 0626 10/05/23 0539 10/06/23 0928 10/07/23 1106  NA 137   < > 138   < > 131*  K 4.5   < > 5.0   < > 4.2  CL 109   < > 112*   < > 102  CO2 16*   < > 24   < > 21*  GLUCOSE 89   < > 119*   < > 243*  BUN 54*   < > 45*   < > 45*  CREATININE 4.31*   < > 2.35*   < > 2.01*  CALCIUM 8.4*   < > 8.5*   < > 8.0*  MG  --    < > 1.8  --   --   AST 67*  --   --   --   --   ALT 84*  --   --   --   --   ALKPHOS 94  --   --   --   --   BILITOT 0.8  --   --   --   --    < > = values in this interval not displayed.    Assessment and Plan 72 y.o. female with medical history significant of remote history of partial seizure on Dilantin, recently diagnosed with cardiac amyloidosis, HTN, HLD, chronic A-fib on Eliquis, chronic lymphedema, IIDM, CKD stage IIIb, diabetic neuropathy, presented with worsening of generalized weakness and new onset of partial seizure    Partial seizure (Acute on Chronic) --Clinically suspect this is related to recent use of Levaquin which lowered seizure threshold as patient has remote history of partial seizure 30  years ago.   --On top of that patient has been having AKI process, as a result there might be a toxic level of Levaquin buildup lower seizure threshold. --EEG shows cortical dysfunction arising from right hemisphere, maximal right frontal region likely secondary to underlying stroke.  No seizure or epileptiform discharges were seen. -Discontinued Levaquin, change to doxycycline for 2 more days -neurology consult-Dr. Otelia Limes seen, started Onfi (Clobazam) and stopped other AEDs - no further seizure in the Hospital  AKI on CKD stage IIIb (baseline creat 1.4--2.3) -Prerenal, clinically volume contracted with hypotension, slowly improving --came in with creat 4.31---2.35--2.13 -Held lasix, spironolactone and ACEI--Resuming lasix now  -Avoid nephrotoxic medications.     Deconditioning -PT, OT evaluation - recommends SNF, TOC aware   Recently diagnosed cardiac amyloidosis Chronic Diastolic CHF Persistent Hypotension --Continue Tafamadis (increased dose to 80 mg) --[facility] CHF team following --Echo (08/24) - EF 55-60% --OK to resume  lasix 20 mg every  day now since creat improving --added farxiga --Continue Midodrine. --[facility] cardiology input appreciated--signed off now   Chronic A-fib -Controlled, continue Eliquis   DM2 -SSI for now   Abnormal UA -Negative urine protein, low suspicion for renal amyloidosis at this point -Leukocytes in urine but negative for nitrite and presence of epithelial cells indicating contamination.  Patient has no symptoms UTI, monitor off antibiotics.   Chronic lymphedema with chronic ultrasound bilateral lower extremities Ankle wound on left leg POA -Outpatient follow-up with podiatry - wound care seen for the wound and follow recommendations   TOC for discharge planning to once insurance authorization available. Neurology has signed off. Cardiology is agreeable for  discharge.   Family communication : none today--pt informs me that family is  aware Consults : cardiology, neurology CODE STATUS: full DVT Prophylaxis : eliquis Level of care: Progressive Status is: Inpatient Remains inpatient appropriate because: awaiting insurance authorization    TOTAL TIME TAKING CARE OF THIS PATIENT: 35 minutes.  >50% time spent on counselling and coordination of care  Note: This dictation was prepared with Dragon dictation along with smaller phrase technology. Any transcriptional errors that result from this process are unintentional.  Enedina Finner M.D    [facility]   CC: Primary care physician; Leanna Sato, MD

## 2023-10-07 NOTE — Progress Notes (Signed)
Progress Note  Patient Name: Elizabeth [name] Date of Encounter: 10/07/2023  Primary Cardiologist: Debbe Odea, MD   Subjective   No chest pain, dyspnea, or palpitations.  Lower extremities wrapped.  Output 1.3 L for the admission.  Labs pending.  BP 80s to low 100s mmHg systolic.  Inpatient Medications    Scheduled Meds:  apixaban  5 mg Oral BID   Chlorhexidine Gluconate Cloth  6 each Topical Daily   cloBAZam  5 mg Oral BID   dapagliflozin propanediol  10 mg Oral Daily   furosemide  20 mg Oral Daily   gabapentin  300 mg Oral Daily   insulin aspart  0-9 Units Subcutaneous TID WC   midodrine  5 mg Oral TID WC   pravastatin  40 mg Oral Daily   sodium bicarbonate  650 mg Oral BID   Tafamidis Meglumine (Cardiac)  80 mg Oral Daily   Continuous Infusions:  PRN Meds: acetaminophen, mouth rinse, mouth rinse   Vital Signs    Vitals:   10/07/23 0411 10/07/23 0413 10/07/23 0421 10/07/23 0824  BP: (!) 85/52 (!) 82/39 (!) 85/52 110/60  Pulse: (!) 52 (!) 55 (!) 49 (!) 58  Resp: 16  16   Temp: 98.3 F (36.8 C)  98.1 F (36.7 C) 98.4 F (36.9 C)  TempSrc: Oral  Oral   SpO2: 97% 98% 98% 96%  Weight:      Height:        Intake/Output Summary (Last 24 hours) at 10/07/2023 1105 Last data filed at 10/07/2023 0412 Gross per 24 hour  Intake 360 ml  Output 625 ml  Net -265 ml   Filed Weights   10/02/23 1318  Weight: 88 kg    Telemetry    A-fib with ventricular rates in the 60s bpm - Personally Reviewed  ECG    No new tracings - Personally Reviewed  Physical Exam   GEN: No acute distress.   Neck: No JVD. Cardiac: IRIR, I/VI systolic murmur RUSB, no rubs, or gallops.  Respiratory: Clear to auscultation bilaterally.  GI: Soft, nontender, non-distended.   MS: Lower legs wrapped; No deformity. Neuro:  Alert and oriented x 3; Nonfocal.  Psych: Normal affect.  Labs    Chemistry Recent Labs  Lab 10/02/23 1327 10/03/23 0626 10/04/23 0436 10/05/23 0539  10/06/23 0928  NA 137   < > 139 138 137  K 4.5   < > 4.3 5.0 3.8  CL 109   < > 112* 112* 108  CO2 16*   < > 22 24 20*  GLUCOSE 89   < > 122* 119* 100*  BUN 54*   < > 52* 45* 43*  CREATININE 4.31*   < > 3.11* 2.35* 2.13*  CALCIUM 8.4*   < > 7.9* 8.5* 8.1*  PROT 6.4*  --   --   --   --   ALBUMIN 3.2*  --   --   --   --   AST 67*  --   --   --   --   ALT 84*  --   --   --   --   ALKPHOS 94  --   --   --   --   BILITOT 0.8  --   --   --   --   GFRNONAA 10*   < > 15* 22* 24*  ANIONGAP 12   < > 5 2* 9   < > = values in this interval not  displayed.     Hematology Recent Labs  Lab 10/02/23 1327 10/04/23 0436 10/05/23 0539  WBC 11.3* 10.8* 10.5  RBC 4.44 4.04 4.40  HGB 12.9 11.9* 12.8  HCT 38.9 34.2* 37.4  MCV 87.6 84.7 85.0  MCH 29.1 29.5 29.1  MCHC 33.2 34.8 34.2  RDW 14.9 14.9 14.8  PLT 180 151 161    Cardiac EnzymesNo results for input(s): "TROPONINI" in the last 168 hours. No results for input(s): "TROPIPOC" in the last 168 hours.   BNP Recent Labs  Lab 10/02/23 1327  BNP 116.7*     DDimer No results for input(s): "DDIMER" in the last 168 hours.   Radiology    No results found.  Cardiac Studies   See CV Studies  Patient Profile     72 y.o. 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 AHF team to see for cardiac amyloid.   Assessment & Plan    1. Chronic diastolic CHF:   -Last echo in 1/61 with EF 55-60%, normal RV.   -PYP scan in 10/24 read as grade 2, suggestive of cardiac amyloidosis though H/CL not markedly elevated at 1.32.   started on tafamidis.  -Lasix increased as outpatient, admitted with AKI, off diuretics, SCr trending down, 2.13 on  11/28.   -Underlying lymphedema which makes assessment of volume difficult.   -On Farxiga 10 mg daily -Back on Lasix 20 daily, await BMP -Monitor blood pressure, on midodrine 5 mg tid for hypotension -May need to increase midodrine up to 10 mg 3 times daily for positive orthostatics -Continue tafamidis, family will bring in medication -Output patient cardiac MRI to confirm cardiac amyloidosis -CHF clinic to consider Cardiomems placement to help with volume management    2. AKI on CKD stage 3:  -Baseline creatinine 1.28, up to 4.3 at admission, possible overdiuresis -Creatinine trending down, pending todau -On Farxiga 10 mg with Lasix 20  mg   3. Hypotension:  -SBP now in 100s.  Vagal event 11/27 working with PT in the setting of nausea, hypotensive systolics into the 80s improved up to 120s in recovery -Concern for possible autonomic dysfunction from amyloidosis.  -Continue midodrine as above.  May need to increase up to 10, three times daily for symptomatic hypotension   4. Atrial fibrillation:  -Rate currently stable in 60s   -Notes indicating history of bradycardia, suspected sick sinus syndrome  -Continue apixaban, does not meet reduced dosing -No nodal blockade with sick sinus syndrome -May eventually need PPM.  -Not a good candidate for AV nodal blocker agents or antiarrhythmics in effort to restore normal sinus rhythm   5. Seizure: h/o prior seizures.   -Suspect threshold for seizures lowered with levofloxacin use + AKI.   - Neurology following, meds adjusted   6. Wound infection:  -Venous stasis ulcerations with ?infection.  -Levofloxacin stopped       For questions or updates, please contact [facility] Please consult [email or web address] for contact info under Cardiology/STEMI.    Signed, Eula Listen, PA-C [facility] Pager: [phone] 10/07/2023, 11:05 AM

## 2023-10-08 DIAGNOSIS — I5032 Chronic diastolic (congestive) heart failure: Secondary | ICD-10-CM | POA: Diagnosis not present

## 2023-10-08 DIAGNOSIS — E854 Organ-limited amyloidosis: Secondary | ICD-10-CM | POA: Diagnosis not present

## 2023-10-08 DIAGNOSIS — N289 Disorder of kidney and ureter, unspecified: Secondary | ICD-10-CM | POA: Diagnosis not present

## 2023-10-08 DIAGNOSIS — R569 Unspecified convulsions: Secondary | ICD-10-CM | POA: Diagnosis not present

## 2023-10-08 LAB — GLUCOSE, CAPILLARY
Glucose-Capillary: 110 mg/dL — ABNORMAL HIGH (ref 70–99)
Glucose-Capillary: 116 mg/dL — ABNORMAL HIGH (ref 70–99)
Glucose-Capillary: 140 mg/dL — ABNORMAL HIGH (ref 70–99)
Glucose-Capillary: 127 mg/dL — ABNORMAL HIGH (ref 70–99)

## 2023-10-08 NOTE — Progress Notes (Signed)
[facility]  - [place] at [facility]   PATIENT NAME: Elizabeth [name]    MR#:  [id number]  DATE OF BIRTH:  05-22-1951  SUBJECTIVE:  patient sitting up in the recliner chair. No family at bedside. Denies any complaints overall feeling better. Ambulated with PT yesterday ~140ft   VITALS:  Blood pressure (!) 121/53, pulse 62, temperature 98.1 F (36.7 C), resp. rate 16, height 5\' 3"  (1.6 m), weight 88 kg, SpO2 97%.  PHYSICAL EXAMINATION:   GENERAL:  72 y.o.-year-old patient with no acute distress.  LUNGS: Normal breath sounds bilaterally, no wheezing CARDIOVASCULAR: S1, S2 normal. No murmur   ABDOMEN: Soft, nontender, nondistended. EXTREMITIES: chronic lymphedema NEUROLOGIC: nonfocal  patient is alert and awake  LABORATORY PANEL:  CBC Recent Labs  Lab 10/05/23 0539  WBC 10.5  HGB 12.8  HCT 37.4  PLT 161    Chemistries  Recent Labs  Lab 10/02/23 1327 10/03/23 0626 10/05/23 0539 10/06/23 0928 10/07/23 1106  NA 137   < > 138   < > 131*  K 4.5   < > 5.0   < > 4.2  CL 109   < > 112*   < > 102  CO2 16*   < > 24   < > 21*  GLUCOSE 89   < > 119*   < > 243*  BUN 54*   < > 45*   < > 45*  CREATININE 4.31*   < > 2.35*   < > 2.01*  CALCIUM 8.4*   < > 8.5*   < > 8.0*  MG  --    < > 1.8  --   --   AST 67*  --   --   --   --   ALT 84*  --   --   --   --   ALKPHOS 94  --   --   --   --   BILITOT 0.8  --   --   --   --    < > = values in this interval not displayed.    Assessment and Plan 72 y.o. female with medical history significant of remote history of partial seizure on Dilantin, recently diagnosed with cardiac amyloidosis, HTN, HLD, chronic A-fib on Eliquis, chronic lymphedema, IIDM, CKD stage IIIb, diabetic neuropathy, presented with worsening of generalized weakness and new onset of partial seizure    Partial seizure (Acute on Chronic) --Clinically suspect this is related to recent use of Levaquin which lowered seizure threshold as patient has remote  history of partial seizure 30 years ago.   --On top of that patient has been having AKI process, as a result there might be a toxic level of Levaquin buildup lower seizure threshold. --EEG shows cortical dysfunction arising from right hemisphere, maximal right frontal region likely secondary to underlying stroke.  No seizure or epileptiform discharges were seen. -Discontinued Levaquin, change to doxycycline for 2 more days -neurology consult-Dr. Otelia Limes seen, started Onfi (Clobazam) and stopped other AEDs - no further seizure in the Hospital  AKI on CKD stage IIIb (baseline creat 1.4--2.3) -Prerenal, clinically volume contracted with hypotension, slowly improving --came in with creat 4.31---2.35--2.13 -Held lasix, spironolactone and ACEI--Resuming lasix now  -Avoid nephrotoxic medications.     Deconditioning -PT, OT evaluation - recommends SNF, TOC aware   Recently diagnosed cardiac amyloidosis Chronic Diastolic CHF Persistent Hypotension --Continue Tafamadis (increased dose to 80 mg) --[facility] CHF team following --Echo (08/24) - EF 55-60% --OK to resume  lasix 20 mg every day now since creat improving --added farxiga --Continue Midodrine. --[facility] cardiology input appreciated--signed off now   Chronic A-fib -Controlled, continue Eliquis   DM2 -SSI for now   Abnormal UA -Negative urine protein, low suspicion for renal amyloidosis at this point -Leukocytes in urine but negative for nitrite and presence of epithelial cells indicating contamination.  Patient has no symptoms UTI, monitor off antibiotics.   Chronic lymphedema with chronic ultrasound bilateral lower extremities Ankle wound on left leg POA -Outpatient follow-up with podiatry - wound care seen for the wound and follow recommendations   TOC for discharge planning to once insurance authorization available. Neurology has signed off. Cardiology is agreeable for  discharge.   Family communication : none today--pt  informs me that family is aware Consults : cardiology, neurology CODE STATUS: full DVT Prophylaxis : eliquis Level of care: Progressive Status is: Inpatient Remains inpatient appropriate because: awaiting insurance authorization    TOTAL TIME TAKING CARE OF THIS PATIENT: 35 minutes.  >50% time spent on counselling and coordination of care  Note: This dictation was prepared with Dragon dictation along with smaller phrase technology. Any transcriptional errors that result from this process are unintentional.  Enedina Finner M.D    [facility]   CC: Primary care physician; Leanna Sato, MD

## 2023-10-09 DIAGNOSIS — R569 Unspecified convulsions: Secondary | ICD-10-CM | POA: Diagnosis not present

## 2023-10-09 DIAGNOSIS — I5032 Chronic diastolic (congestive) heart failure: Secondary | ICD-10-CM | POA: Diagnosis not present

## 2023-10-09 DIAGNOSIS — E854 Organ-limited amyloidosis: Secondary | ICD-10-CM | POA: Diagnosis not present

## 2023-10-09 DIAGNOSIS — N289 Disorder of kidney and ureter, unspecified: Secondary | ICD-10-CM | POA: Diagnosis not present

## 2023-10-09 LAB — GLUCOSE, CAPILLARY
Glucose-Capillary: 196 mg/dL — ABNORMAL HIGH (ref 70–99)
Glucose-Capillary: 159 mg/dL — ABNORMAL HIGH (ref 70–99)
Glucose-Capillary: 141 mg/dL — ABNORMAL HIGH (ref 70–99)
Glucose-Capillary: 173 mg/dL — ABNORMAL HIGH (ref 70–99)

## 2023-10-09 NOTE — Plan of Care (Signed)

## 2023-10-09 NOTE — TOC Progression Note (Signed)
Transition of Care [facility]) - Progression Note    Patient Details  Name: Elizabeth [name] MRN: [id number] Date of Birth: 09-29-1951  Transition of Care [facility]) CM/SW Contact  Bing Quarry, RN Phone Number: 10/09/2023, 2:20 PM  Clinical Narrative:  12/1: Insurance authorization remains pending at 220 pm today. Ref # [id number].  Gabriel Cirri MSN RN CM  Care Management Department.  [place]  [facility] Direct Dial: [phone] Main Office Phone: [phone] Weekends Only      Expected Discharge Plan: Skilled Nursing Facility Barriers to Discharge: Continued Medical Work up  Expected Discharge Plan and Services     Post Acute Care Choice: Skilled Nursing Facility Living arrangements for the past 2 months: Single Family Home                                       Social Determinants of Health (SDOH) Interventions SDOH Screenings   Food Insecurity: No Food Insecurity (10/03/2023)  Housing: Low Risk  (10/03/2023)  Transportation Needs: No Transportation Needs (10/03/2023)  Utilities: Not At Risk (10/03/2023)  Tobacco Use: Low Risk  (10/03/2023)    Readmission Risk Interventions    10/04/2023    1:13 PM  Readmission Risk Prevention Plan  PCP or Specialist Appt within 3-5 Days Complete  Social Work Consult for Recovery Care Planning/Counseling Complete  Palliative Care Screening Not Applicable

## 2023-10-09 NOTE — Progress Notes (Signed)
[facility]  - [place] at [facility]   PATIENT NAME: Elizabeth [name]    MR#:  [id number]  DATE OF BIRTH:  19-Apr-1951  SUBJECTIVE:  patient sitting up in the recliner chair. No family at bedside. Denies any complaints overall feeling better. Had large BM per Rn   VITALS:  Blood pressure (!) 102/47, pulse (!) 57, temperature 98.6 F (37 C), temperature source Oral, resp. rate 16, height 5\' 3"  (1.6 m), weight 88 kg, SpO2 99%.  PHYSICAL EXAMINATION:   GENERAL:  72 y.o.-year-old patient with no acute distress.  LUNGS: Normal breath sounds bilaterally, no wheezing CARDIOVASCULAR: S1, S2 normal. No murmur   ABDOMEN: Soft, nontender, nondistended. EXTREMITIES: chronic lymphedema NEUROLOGIC: nonfocal  patient is alert and awake  LABORATORY PANEL:  CBC Recent Labs  Lab 10/05/23 0539  WBC 10.5  HGB 12.8  HCT 37.4  PLT 161    Chemistries  Recent Labs  Lab 10/02/23 1327 10/03/23 0626 10/05/23 0539 10/06/23 0928 10/07/23 1106  NA 137   < > 138   < > 131*  K 4.5   < > 5.0   < > 4.2  CL 109   < > 112*   < > 102  CO2 16*   < > 24   < > 21*  GLUCOSE 89   < > 119*   < > 243*  BUN 54*   < > 45*   < > 45*  CREATININE 4.31*   < > 2.35*   < > 2.01*  CALCIUM 8.4*   < > 8.5*   < > 8.0*  MG  --    < > 1.8  --   --   AST 67*  --   --   --   --   ALT 84*  --   --   --   --   ALKPHOS 94  --   --   --   --   BILITOT 0.8  --   --   --   --    < > = values in this interval not displayed.    Assessment and Plan 72 y.o. female with medical history significant of remote history of partial seizure on Dilantin, recently diagnosed with cardiac amyloidosis, HTN, HLD, chronic A-fib on Eliquis, chronic lymphedema, IIDM, CKD stage IIIb, diabetic neuropathy, presented with worsening of generalized weakness and new onset of partial seizure    Partial seizure (Acute on Chronic) --Clinically suspect this is related to recent use of Levaquin which lowered seizure threshold as patient  has remote history of partial seizure 30 years ago.   --On top of that patient has been having AKI process, as a result there might be a toxic level of Levaquin buildup lower seizure threshold. --EEG shows cortical dysfunction arising from right hemisphere, maximal right frontal region likely secondary to underlying stroke.  No seizure or epileptiform discharges were seen. -Discontinued Levaquin, change to doxycycline for 2 more days -neurology consult-Dr. Otelia Limes seen, started Onfi (Clobazam) and stopped other AEDs - no further seizure in the Hospital  AKI on CKD stage IIIb (baseline creat 1.4--2.3) -Prerenal, clinically volume contracted with hypotension, slowly improving --came in with creat 4.31---2.35--2.13 -Held lasix, spironolactone and ACEI--Resuming lasix now  -Avoid nephrotoxic medications.     Deconditioning -PT, OT evaluation - recommends SNF, TOC aware   Recently diagnosed cardiac amyloidosis Chronic Diastolic CHF Persistent Hypotension --Continue Tafamadis (increased dose to 80 mg) --[facility] CHF team following --Echo (08/24) - EF 55-60% --  OK to resume  lasix 20 mg every day now since creat improving --added farxiga --Continue Midodrine. --[facility] cardiology input appreciated--signed off now   Chronic A-fib -Controlled, continue Eliquis   DM2 -SSI for now   Abnormal UA -Negative urine protein, low suspicion for renal amyloidosis at this point -Leukocytes in urine but negative for nitrite and presence of epithelial cells indicating contamination.  Patient has no symptoms UTI, monitor off antibiotics.   Chronic lymphedema with chronic ultrasound bilateral lower extremities Ankle wound on left leg POA -Outpatient follow-up with podiatry - wound care seen for the wound and follow recommendations   TOC for discharge planning to once insurance authorization available. Neurology has signed off. Cardiology is agreeable for  discharge.   Family communication : none  today--pt informs me that family is aware Consults : cardiology, neurology CODE STATUS: full DVT Prophylaxis : eliquis Level of care: Progressive Status is: Inpatient Remains inpatient appropriate because: awaiting insurance authorization    TOTAL TIME TAKING CARE OF THIS PATIENT: 35 minutes.  >50% time spent on counselling and coordination of care  Note: This dictation was prepared with Dragon dictation along with smaller phrase technology. Any transcriptional errors that result from this process are unintentional.  Enedina Finner M.D    [facility]   CC: Primary care physician; Leanna Sato, MD

## 2023-10-09 NOTE — Plan of Care (Signed)
  Problem: Skin Integrity: Goal: Risk for impaired skin integrity will decrease Outcome: Progressing   Problem: Education: Goal: Knowledge of General Education information will improve Description: Including pain rating scale, medication(s)/side effects and non-pharmacologic comfort measures Outcome: Progressing   Problem: Health Behavior/Discharge Planning: Goal: Ability to manage health-related needs will improve Outcome: Progressing

## 2023-10-10 ENCOUNTER — Telehealth (HOSPITAL_COMMUNITY): Payer: Self-pay | Admitting: Pharmacy Technician

## 2023-10-10 ENCOUNTER — Ambulatory Visit: Payer: Medicare PPO | Admitting: Physician Assistant

## 2023-10-10 ENCOUNTER — Other Ambulatory Visit (HOSPITAL_COMMUNITY): Payer: Self-pay

## 2023-10-10 DIAGNOSIS — R569 Unspecified convulsions: Secondary | ICD-10-CM | POA: Diagnosis not present

## 2023-10-10 LAB — GLUCOSE, CAPILLARY
Glucose-Capillary: 120 mg/dL — ABNORMAL HIGH (ref 70–99)
Glucose-Capillary: 196 mg/dL — ABNORMAL HIGH (ref 70–99)

## 2023-10-10 MED ORDER — DAPAGLIFLOZIN PROPANEDIOL 10 MG PO TABS: 10.0000 mg | ORAL_TABLET | Freq: Every day | ORAL | 1 refills | Status: DC

## 2023-10-10 MED ORDER — MIDODRINE HCL 5 MG PO TABS: 5.0000 mg | ORAL_TABLET | Freq: Three times a day (TID) | ORAL | 0 refills | Status: DC

## 2023-10-10 MED ORDER — FUROSEMIDE 20 MG PO TABS: 20.0000 mg | ORAL_TABLET | Freq: Every day | ORAL | 1 refills | Status: DC

## 2023-10-10 MED ORDER — CLOBAZAM 2.5 MG/ML PO SUSP: 5.0000 mg | Freq: Two times a day (BID) | ORAL | 1 refills | Status: DC

## 2023-10-10 NOTE — Discharge Summary (Addendum)
Physician Discharge Summary   Patient: Elizabeth [name] MRN: [id number] DOB: May 25, 1951  Admit date:     10/02/2023  Discharge date: 10/10/23  Discharge Physician: Enedina Finner   PCP: Leanna Sato, MD   Recommendations at discharge:    F/u CHF clinic F/u [facility] cardiology in 1-2 weeks F/u PCP in 1-2 weeks  Discharge Diagnoses: Principal Problem:   Seizure [facility]) Active Problems:   AKI (acute kidney injury) (HCC)   Partial seizure (HCC)   Cardiac amyloidosis (HCC)   Acute renal insufficiency   Weakness   Chronic diastolic CHF (congestive heart failure) (HCC)  72 y.o. female with medical history significant of remote history of partial seizure on Dilantin, recently diagnosed with cardiac amyloidosis, HTN, HLD, chronic A-fib on Eliquis, chronic lymphedema, IIDM, CKD stage IIIb, diabetic neuropathy, presented with worsening of generalized weakness and new onset of partial seizure    Partial seizure (Acute on Chronic) --Clinically suspect this is related to recent use of Levaquin which lowered seizure threshold as patient has remote history of partial seizure 30 years ago.   --On top of that patient has been having AKI process, as a result there might be a toxic level of Levaquin buildup lower seizure threshold. --EEG shows cortical dysfunction arising from right hemisphere, maximal right frontal region likely secondary to underlying stroke.  No seizure or epileptiform discharges were seen. -Discontinued Levaquin, change to doxycycline for 2 more days -neurology consult-Dr. Otelia Limes seen, started Onfi (Clobazam) and stopped other AEDs - no further seizure in the Hospital   AKI on CKD stage IIIb (baseline creat 1.4--2.3) -Prerenal, clinically volume contracted with hypotension, slowly improving --came in with creat 4.31---2.35--2.13 -Held lasix, spironolactone and ACEI--Resuming lasix now  -Avoid nephrotoxic medications.     Deconditioning -PT, OT evaluation - recommends SNF    Recently diagnosed cardiac amyloidosis Chronic Diastolic CHF Persistent Hypotension --Continue Tafamadis (increased dose to 80 mg) --[facility] team following --Echo (08/24) - EF 55-60% --OK to resume  lasix 20 mg every day now since creat improving --added farxiga --Continue Midodrine. --[facility] cardiology input appreciated--signed off now--hold Spironolactone, Lisinopril for now   Chronic A-fib -Controlled, continue Eliquis   DM2 -SSI  --on Farxiga   Abnormal UA -Negative urine protein, low suspicion for renal amyloidosis at this point -Leukocytes in urine but negative for nitrite and presence of epithelial cells indicating contamination.  Patient has no symptoms UTI, monitor off antibiotics.   Chronic lymphedema with chronic ultrasound bilateral lower extremities Ankle wound on left leg POA -Outpatient follow-up with podiatry - wound care seen for the wound and follow recommendations    Neurology has signed off. Cardiology is agreeable for  discharge.  D/c to rehab today Peer to peer done in the morning     Family communication : none today--pt informs me that family is aware Consults : cardiology, neurology CODE STATUS: full DVT Prophylaxis : eliquis        Diet recommendation:  Discharge Diet Orders (From admission, onward)     Start     Ordered   10/10/23 0000  Diet - low sodium heart healthy       Carb modified diet 10/10/23 1235            DISCHARGE MEDICATION: Allergies as of 10/10/2023       Reactions   Levaquin [levofloxacin] Other (See Comments)   Partial seizure        Medication List     STOP taking these medications    levofloxacin  250 MG tablet Commonly known as: LEVAQUIN   linagliptin 5 MG Tabs tablet Commonly known as: TRADJENTA   lisinopril 10 MG tablet Commonly known as: ZESTRIL   spironolactone 25 MG tablet Commonly known as: ALDACTONE       TAKE these medications    acetaminophen 325 MG tablet Commonly known  as: TYLENOL Take 2 tablets (650 mg total) by mouth every 6 (six) hours as needed for mild pain (or Fever >/= 101).   apixaban 5 MG Tabs tablet Commonly known as: ELIQUIS Take 1 tablet (5 mg total) by mouth 2 (two) times daily.   CALCIUM 1000 + D PO Take 1 tablet by mouth daily.   cloBAZam 2.5 MG/ML solution Commonly known as: ONFI Take 2 mLs (5 mg total) by mouth 2 (two) times daily.   dapagliflozin propanediol 10 MG Tabs tablet Commonly known as: FARXIGA Take 1 tablet (10 mg total) by mouth daily. Start taking on: October 11, 2023   furosemide 20 MG tablet Commonly known as: LASIX Take 1 tablet (20 mg total) by mouth daily. Start taking on: October 11, 2023 What changed:  medication strength how much to take   gabapentin 300 MG capsule Commonly known as: NEURONTIN Take 300 mg by mouth at bedtime as needed (leg pain).   midodrine 5 MG tablet Commonly known as: PROAMATINE Take 1 tablet (5 mg total) by mouth 3 (three) times daily with meals.   potassium chloride 10 MEQ tablet Commonly known as: KLOR-CON Take 2 tablets (20 mEq total) by mouth daily.   pravastatin 40 MG tablet Commonly known as: PRAVACHOL Take 40 mg by mouth daily. In afternoon   True Metrix Blood Glucose Test test strip Generic drug: glucose blood   TRUEplus Lancets 33G Misc   Vyndaqel 20 MG Caps Generic drug: Tafamidis Meglumine (Cardiac) Take 4 capsules (80 mg total) by mouth daily.               Discharge Care Instructions  (From admission, onward)           Start     Ordered   10/10/23 0000  Discharge wound care:       Comments: Wound type: full thickness r/t venous insufficiency  Measurement: L medial lower leg 3 cm x 3 cm x 0.1 cm 100% pink moist; L anterior lower leg 4 cm x 2 cm x 0.1 cm 100% pink moist; scattered 1 cm x 0.5 cm x 0.1 cm full thickness L lateral lower leg pink moist R leg scattered areas of partial thickness skin loss most significant 3 cm x 1 cm anterior  lower leg  Wound bed:as above  Drainage (amount, consistency, odor) minimal serosanguinous L leg  Periwound: dry peeling skin, evidence of healed venous ulcerations  Dressing procedure/placement/frequency: Cleansed lower leg wounds with NS, cleaned feet with soap and water.  Xeroform gauze applied to open ulcerations bilateral legs.  Applied unna boot dressing to bilateral lower legs and covered with Conform.     WOC team will follow weekly for unna boot changes   10/10/23 1235            Contact information for follow-up providers     [facility] Follow up in 10 day(s).   Specialty: Cardiology Why: Hospital Follow-Up with Dr. Elwyn Lade Please bring all medications to appointmwnt with you.  [facility], [address], [address] Floor [employer] Parking @ the Advertising account planner information: [address] [place]  [address] [phone]        Leanna Sato, MD. Schedule an appointment as soon as possible for a visit in 1 week(s).   Specialty: Family Medicine Contact information: [address] [place] [address] [phone]         Yvonne Kendall, MD. Schedule an appointment as soon as possible for a visit in 10 day(s).   Specialty: Cardiology Why: Jobe Igo information: [address] [place] [address] [phone]              Contact information for after-discharge care     Destination     [facility] Preferred SNF .   Service: Skilled Nursing Contact information: [address] [place] [address] [phone]                    Discharge Exam: Ceasar Mons Weights   10/02/23 1318  Weight: 88 kg   GENERAL:  72 y.o.-year-old patient with no acute distress.  LUNGS: Normal breath sounds bilaterally, no wheezing CARDIOVASCULAR: S1, S2 normal. No murmur   ABDOMEN: Soft, nontender, nondistended. EXTREMITIES: chronic  lymphedema NEUROLOGIC: nonfocal  patient is alert and awake    Condition at discharge: fair  The results of significant diagnostics from this hospitalization (including imaging, microbiology, ancillary and laboratory) are listed below for reference.   Imaging Studies: EEG adult  Result Date: 10/26/2023 Charlsie Quest, MD     2023/10/26 11:37 AM Patient Name: OREANA KOESTER MRN: [id number] Epilepsy Attending: Charlsie Quest Referring Physician/Provider: Emeline General, MD Date: 26-Oct-2023 Duration: 27.47 mins Patient history: 72 yo F with remote history of partial seizure with right facial tingling and muscle twitching. EEG to evaluate for seizure Level of alertness: Awake, asleep AEDs during EEG study: GBP Technical aspects: This EEG study was done with scalp electrodes positioned according to the 10-20 International system of electrode placement. Electrical activity was reviewed with band pass filter of 1-70Hz , sensitivity of 7 uV/mm, display speed of 25mm/sec with a 60Hz  notched filter applied as appropriate. EEG data were recorded continuously and digitally stored.  Video monitoring was available and reviewed as appropriate. Description: The posterior dominant rhythm consists of 80 Hz activity of moderate voltage (25-35 uV) seen predominantly in posterior head regions, symmetric and reactive to eye opening and eye closing. Sleep was characterized by vertex waves, sleep spindles (12 to 14 Hz), maximal frontocentral region. EEG showed continuous 3 to 6 Hz theta-delta slowing in right hemisphere, maximal right frontal region. Hyperventilation and photic stimulation were not performed.   ABNORMALITY - Continuous slow, right hemisphere, maximal right frontal region IMPRESSION: This study is suggestive of cortical dysfunction arising from right hemisphere, maximal right frontal region likely secondary to underlying stroke. No seizures or epileptiform discharges were seen throughout the recording.  Please note lack of epileptiform activity during interictal EEG does not exclude the diagnosis of epilepsy. Priyanka Annabelle Harman   CT HEAD WO CONTRAST ( )  Result Date: 10/02/2023 CLINICAL DATA:  History EXAM: CT HEAD WITHOUT CONTRAST TECHNIQUE: Contiguous axial images were obtained from the base of the skull through the vertex without intravenous contrast. RADIATION DOSE REDUCTION: This exam was performed according to the departmental dose-optimization program which includes automated exposure control, adjustment of the mA and/or kV according to patient size and/or use of iterative reconstruction technique. COMPARISON:  07/02/23 CT head FINDINGS: Brain: No hemorrhage. No hydrocephalus. No extra-axial fluid collection. No CT evidence of an acute cortical infarct.  No mass effect. No mass lesion. There is chronic infarcts right frontal lobe and basal ganglia. There is also a chronic infarct in the right occipital lobe Vascular: No hyperdense vessel or unexpected calcification. Skull: Normal. Negative for fracture or focal lesion. Sinuses/Orbits: Middle ear or mastoid effusion. Paranasal sinuses are clear. Right lens replacement. 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 Other: None. IMPRESSION: No hemorrhage or CT evidence of an acute cortical infarct. Electronically Signed   By: Lorenza Cambridge M.D.   On: 10/02/2023 15:29    Microbiology: Results for orders placed or performed during the hospital encounter of 07/02/23  Resp panel by RT-PCR (RSV, Flu A&B, Covid) Anterior Nasal Swab     Status: None   Collection Time: 07/02/23  8:50 PM   Specimen: Anterior Nasal Swab  Result Value Ref Range Status   SARS Coronavirus 2 by RT PCR NEGATIVE NEGATIVE Final    Comment: (NOTE) SARS-CoV-2 target nucleic acids are NOT DETECTED.  The SARS-CoV-2 RNA is generally detectable in upper respiratory specimens during the acute phase of infection. The lowest concentration of SARS-CoV-2 viral copies this assay can detect is 138  copies/mL. A negative result does not preclude SARS-Cov-2 infection and should not be used as the sole basis for treatment or other patient management decisions. A negative result may occur with  improper specimen collection/handling, submission of specimen other than nasopharyngeal swab, presence of viral mutation(s) within the areas targeted by this assay, and inadequate number of viral copies(<138 copies/mL). A negative result must be combined with clinical observations, patient history, and epidemiological information. The expected result is Negative.  Fact Sheet for Patients:  BloggerCourse.com  Fact Sheet for Healthcare Providers:  SeriousBroker.it  This test is no t yet approved or cleared by the [place] FDA and  has been authorized for detection and/or diagnosis of SARS-CoV-2 by FDA under an Emergency Use Authorization (EUA). This EUA will remain  in effect (meaning this test can be used) for the duration of the COVID-19 declaration under Section 564(b)(1) of the Act, 21 U.S.C.section 360bbb-3(b)(1), unless the authorization is terminated  or revoked sooner.       Influenza A by PCR NEGATIVE NEGATIVE Final   Influenza B by PCR NEGATIVE NEGATIVE Final    Comment: (NOTE) The Xpert Xpress SARS-CoV-2/FLU/RSV plus assay is intended as an aid in the diagnosis of influenza from Nasopharyngeal swab specimens and should not be used as a sole basis for treatment. Nasal washings and aspirates are unacceptable for Xpert Xpress SARS-CoV-2/FLU/RSV testing.  Fact Sheet for Patients: BloggerCourse.com  Fact Sheet for Healthcare Providers: SeriousBroker.it  This test is not yet approved or cleared by the [place] FDA and has been authorized for detection and/or diagnosis of SARS-CoV-2 by FDA under an Emergency Use Authorization (EUA). This EUA will remain in effect (meaning  this test can be used) for the duration of the COVID-19 declaration under Section 564(b)(1) of the Act, 21 U.S.C. section 360bbb-3(b)(1), unless the authorization is terminated or revoked.     Resp Syncytial Virus by PCR NEGATIVE NEGATIVE Final    Comment: (NOTE) Fact Sheet for Patients: BloggerCourse.com  Fact Sheet for Healthcare Providers: SeriousBroker.it  This test is not yet approved or cleared by the [place] FDA and has been authorized for detection and/or diagnosis of SARS-CoV-2 by FDA under an Emergency Use Authorization (EUA). This EUA will remain in effect (meaning this test can be used) for the duration of the COVID-19 declaration under Section 564(b)(1) of the Act, 21 U.S.C.  section 360bbb-3(b)(1), unless the authorization is terminated or revoked.  Performed at [facility], [address] [place], [place] [address]     Labs: CBC: Recent Labs  Lab 10/04/23 0436 10/05/23 0539  WBC 10.8* 10.5  HGB 11.9* 12.8  HCT 34.2* 37.4  MCV 84.7 85.0  PLT 151 161   Basic Metabolic Panel: Recent Labs  Lab 10/03/23 1432 10/04/23 0436 10/05/23 0539 10/06/23 0928 10/07/23 1106  NA 135 139 138 137 131*  K 3.9 4.3 5.0 3.8 4.2  CL 112* 112* 112* 108 102  CO2 16* 22 24 20* 21*  GLUCOSE 144* 122* 119* 100* 243*  BUN 47* 52* 45* 43* 45*  CREATININE 3.31* 3.11* 2.35* 2.13* 2.01*  CALCIUM 7.6* 7.9* 8.5* 8.1* 8.0*  MG  --  1.3* 1.8  --   --   PHOS  --  3.6  --   --   --    Liver Function Tests: No results for input(s): "AST", "ALT", "ALKPHOS", "BILITOT", "PROT", "ALBUMIN" in the last 168 hours. CBG: Recent Labs  Lab 10/09/23 1212 10/09/23 1621 10/09/23 2134 10/10/23 0821 10/10/23 1209  GLUCAP 159* 141* 173* 120* 196*    Discharge time spent: greater than 30 minutes.  Signed: Enedina Finner, MD [facility] 10/10/2023

## 2023-10-10 NOTE — Care Management Important Message (Signed)
Important Message  Patient Details  Name: MALESSA HIBDON MRN: [id number] Date of Birth: 25-Feb-1951   Important Message Given:  Yes - Medicare IM     Olegario Messier A Kashonda Sarkisyan 10/10/2023, 2:31 PM

## 2023-10-10 NOTE — Telephone Encounter (Signed)
Pharmacy Patient Advocate Encounter   Received notification from Fax that prior authorization for cloBAZam 2.5MG /ML suspension is required/requested.   Insurance verification completed.   The patient is insured through [employer] .   Per test claim: PA required; PA submitted to above mentioned insurance via CoverMyMeds Key/confirmation #/EOC [facility] Status is pending

## 2023-10-10 NOTE — TOC Transition Note (Signed)
Transition of Care [facility]) - CM/SW Discharge Note   Patient Details  Name: Elizabeth [name] MRN: [id number] Date of Birth: August 14, 1951  Transition of [facility]) CM/SW Contact:  Truddie Hidden, RN Phone Number: 10/10/2023, 1:34 PM   Clinical Narrative:    Spoke with Tammy in admissions at [facility]  Per facility patient admission confirmed for today. Patient assigned room # 610  Nurse will call report to [phone] Face sheet and medical necessity forms printed to the floor to be added to the EMS pack Discharge summary and SNF transfer report sent in HUB.  Nurse and patient notified  TOC signing off.      Barriers to Discharge: Continued Medical Work up   Patient Goals and CMS Choice CMS Medicare.gov Compare Post Acute Care list provided to:: Patient    Discharge Placement                         Discharge Plan and Services Additional resources added to the After Visit Summary for       Post Acute Care Choice: Skilled Nursing Facility                               Social Determinants of Health (SDOH) Interventions SDOH Screenings   Food Insecurity: No Food Insecurity (10/03/2023)  Housing: Low Risk  (10/03/2023)  Transportation Needs: No Transportation Needs (10/03/2023)  Utilities: Not At Risk (10/03/2023)  Tobacco Use: Low Risk  (10/03/2023)     Readmission Risk Interventions    10/04/2023    1:13 PM  Readmission Risk Prevention Plan  PCP or Specialist Appt within 3-5 Days Complete  Social Work Consult for Recovery Care Planning/Counseling Complete  Palliative Care Screening Not Applicable

## 2023-10-10 NOTE — Progress Notes (Signed)
Patient alert and oriented, vss, no complaints of pain.  D/c telemetry and piv.  Escorted patient to husbands truck via wheelchair.    Report called to [facility].

## 2023-10-10 NOTE — Telephone Encounter (Signed)
Patient Product/process development scientist completed.    The patient is insured through [employer]. Patient has Medicare and is not eligible for a copay card, but may be able to apply for patient assistance, if available.    Ran test claim for Farxiga 10 mg and the current 30 day co-pay is $0.00.   This test claim was processed through [facility]- copay amounts may vary at other pharmacies due to pharmacy/plan contracts, or as the patient moves through the different stages of their insurance plan.     Elizabeth [name], CPHT Pharmacy Technician III Certified Patient Advocate [facility] Pharmacy Patient Advocate Team Direct Number: [phone]  Fax: [phone]

## 2023-10-10 NOTE — Progress Notes (Signed)
Mobility Specialist - Progress Note  Pre-mobility: HR 60, SpO2 98% During mobility: HR 72, SpO2 98% Post-mobility: HR 60, SPO2 99%   10/10/23 1010  Mobility  Activity Ambulated with assistance in hallway;Ambulated with assistance in room;Ambulated with assistance to bathroom  Level of Assistance Contact guard assist, steadying assist  Assistive Device Front wheel walker  Distance Ambulated (ft) 80 ft  Activity Response Tolerated well  $Mobility charge 1 Mobility  Mobility Specialist Start Time (ACUTE ONLY) [id number]  Mobility Specialist Stop Time (ACUTE ONLY) 1005  Mobility Specialist Time Calculation (min) (ACUTE ONLY) 42 min   Pt sitting in the recliner upon entry, utilizing RA. Pt agreeable to OOB amb in the hallway this date. Pt STS to RW ModA, amb 80 ft in hallway CGA-- one LOB when turning head L to look at object, MinA for correction. Pt expressed fatigue ~80 ft into amb, prompting a seated rest break, O2 >90% and HR low 70s. Pt wheeled into room, amb to/from the bathroom CGA-- no assist with peri care. Pt returned to the recliner, left seated with alarm set and needs within reach.  Zetta Bills Mobility Specialist 10/10/23 10:24 AM

## 2023-10-10 NOTE — TOC Progression Note (Signed)
Transition of Care [facility]) - Progression Note    Patient Details  Name: Elizabeth [name] MRN: [id number] Date of Birth: [birth year]  Transition of [facility]) CM/SW Contact  Margarito Liner, LCSW Phone Number: 10/10/2023, 8:20 AM  Clinical Narrative:   Insurance is offering a Audiological scientist review for SNF authorization. CSW sent information to MD. Deadline is 12:00 pm today.  Expected Discharge Plan: Skilled Nursing Facility Barriers to Discharge: Continued Medical Work up  Expected Discharge Plan and Services     Post Acute Care Choice: Skilled Nursing Facility Living arrangements for the past 2 months: Single Family Home                                       Social Determinants of Health (SDOH) Interventions SDOH Screenings   Food Insecurity: No Food Insecurity (10/03/2023)  Housing: Low Risk  (10/03/2023)  Transportation Needs: No Transportation Needs (10/03/2023)  Utilities: Not At Risk (10/03/2023)  Tobacco Use: Low Risk  (10/03/2023)    Readmission Risk Interventions    10/04/2023    1:13 PM  Readmission Risk Prevention Plan  PCP or Specialist Appt within 3-5 Days Complete  Social Work Consult for Recovery Care Planning/Counseling Complete  Palliative Care Screening Not Applicable

## 2023-10-11 NOTE — Telephone Encounter (Signed)
Pharmacy Patient Advocate Encounter  Received notification from [facility] that Prior Authorization for cloBAZam 2.5MG /ML suspension  has been APPROVED from 10/11/2023 to 11/07/2024   PA #/Case ID/Reference #: [id number]

## 2023-10-13 ENCOUNTER — Other Ambulatory Visit: Payer: Self-pay

## 2023-10-13 NOTE — Progress Notes (Signed)
Specialty Pharmacy Refill Coordination Note  Elizabeth [name] is a 72 y.o. female contacted today regarding refills of specialty medication(s) Tafamidis Meglumine (Cardiac)   Patient requested Delivery   Delivery date: 10/21/23   Verified address: [address] [place] [address]   Medication will be filled on 12/20/22.

## 2023-10-17 ENCOUNTER — Encounter: Payer: Medicare PPO | Admitting: Cardiology

## 2023-10-18 ENCOUNTER — Encounter: Payer: Medicare PPO | Attending: Physician Assistant | Admitting: Physician Assistant

## 2023-10-18 DIAGNOSIS — I129 Hypertensive chronic kidney disease with stage 1 through stage 4 chronic kidney disease, or unspecified chronic kidney disease: Secondary | ICD-10-CM | POA: Diagnosis not present

## 2023-10-18 DIAGNOSIS — I480 Paroxysmal atrial fibrillation: Secondary | ICD-10-CM | POA: Diagnosis not present

## 2023-10-18 DIAGNOSIS — I890 Lymphedema, not elsewhere classified: Secondary | ICD-10-CM | POA: Diagnosis present

## 2023-10-18 DIAGNOSIS — Z7901 Long term (current) use of anticoagulants: Secondary | ICD-10-CM | POA: Diagnosis not present

## 2023-10-18 DIAGNOSIS — E11622 Type 2 diabetes mellitus with other skin ulcer: Secondary | ICD-10-CM | POA: Diagnosis not present

## 2023-10-18 DIAGNOSIS — L97512 Non-pressure chronic ulcer of other part of right foot with fat layer exposed: Secondary | ICD-10-CM | POA: Diagnosis not present

## 2023-10-18 DIAGNOSIS — E1143 Type 2 diabetes mellitus with diabetic autonomic (poly)neuropathy: Secondary | ICD-10-CM | POA: Diagnosis not present

## 2023-10-18 DIAGNOSIS — N1832 Chronic kidney disease, stage 3b: Secondary | ICD-10-CM | POA: Insufficient documentation

## 2023-10-18 DIAGNOSIS — E1122 Type 2 diabetes mellitus with diabetic chronic kidney disease: Secondary | ICD-10-CM | POA: Diagnosis not present

## 2023-10-18 DIAGNOSIS — L97822 Non-pressure chronic ulcer of other part of left lower leg with fat layer exposed: Secondary | ICD-10-CM | POA: Insufficient documentation

## 2023-10-18 NOTE — Progress Notes (Addendum)
MAIGEN, SUTHAR ([id number]) [id number] Page 1 of 10 Visit Report for 10/18/2023 Allergy List Details Patient Name: Date of Service: Elizabeth [name], [place] Elizabeth LYN [name]. 10/18/2023 10:00 A M Medical Record Number: [id number] Patient Account Number: [id number] Date of Birth/Sex: Treating RN: 1951/10/10 (72 y.o. [name] Primary Care Mahari Strahm: Darreld Mclean Other Clinician: Betha Loa Referring Pasquale Matters: Treating Darchelle Nunes/Extender: Salvatore Decent in Treatment: 4 Allergies Active Allergies Levaquin Reaction: seizures Severity: Severe Active: 10/10/2023 Allergy Notes Electronic Signature(s) Signed: 10/19/2023 5:40:51 PM By: Betha Loa Entered By: Betha Loa on 10/18/2023 09:05:19 -------------------------------------------------------------------------------- Arrival Information Details Patient Name: Date of Service: Elizabeth [name], [place] Elizabeth LYN [name]. 10/18/2023 10:00 A M Medical Record Number: [id number] Patient Account Number: [id number] Date of Birth/Sex: Treating RN: 1951/09/10 (72 y.o. [name] Primary Care Eustacio Ellen: Darreld Mclean Other Clinician: Betha Loa Referring Mckyle Solanki: Treating Neel Buffone/Extender: Salvatore Decent in Treatment: 4 Visit Information Patient Arrived: Wheel Chair Arrival Time: 10:34 Transfer Assistance: EasyPivot Patient Lift Patient Identification Verified: Yes Secondary Verification Process Completed: Yes Patient Requires Transmission-Based Precautions: No Patient Has Alerts: No BERNETHA, DREYFUSS ([id number]) History Since Last Visit All ordered tests and consults were completed: No Added or deleted any medications: No Any new allergies or adverse reactions: No Had a fall or experienced change in activities of daily living that may affect risk of falls: No Signs or symptoms of abuse/neglect since last visito No Hospitalized since last visit: Yes Implantable device outside of the clinic  excluding cellular tissue based products placed in the center since last visit: No Has Dressing in Place as Prescribed: Yes [id number] Page 2 of 10 Has Compression in Place as Prescribed: Yes Pain Present Now: No Electronic Signature(s) Signed: 10/19/2023 5:40:51 PM By: Betha Loa Entered By: Betha Loa on 10/18/2023 07:41:54 -------------------------------------------------------------------------------- Clinic Level of Care Assessment Details Patient Name: Date of Service: Elizabeth [name], [place] Elizabeth LYN [name]. 10/18/2023 10:00 A M Medical Record Number: [id number] Patient Account Number: [id number] Date of Birth/Sex: Treating RN: 14-Feb-1951 (72 y.o. [name] Primary Care Shaylee Stanislawski: Darreld Mclean Other Clinician: Betha Loa Referring Ephram Kornegay: Treating Jasraj Lappe/Extender: Salvatore Decent in Treatment: 4 Clinic Level of Care Assessment Items TOOL 4 Quantity Score []  - 0 Use when only an EandM is performed on FOLLOW-UP visit ASSESSMENTS - Nursing Assessment / Reassessment X- 1 10 Reassessment of Co-morbidities (includes updates in patient status) X- 1 5 Reassessment of Adherence to Treatment Plan ASSESSMENTS - Wound and Skin A ssessment / Reassessment []  - 0 Simple Wound Assessment / Reassessment - one wound X- 2 5 Complex Wound Assessment / Reassessment - multiple wounds []  - 0 Dermatologic / Skin Assessment (not related to wound area) ASSESSMENTS - Focused Assessment X- 1 5 Circumferential Edema Measurements - multi extremities []  - 0 Nutritional Assessment / Counseling / Intervention []  - 0 Lower Extremity Assessment (monofilament, tuning fork, pulses) []  - 0 Peripheral Arterial Disease Assessment (using hand held doppler) ASSESSMENTS - Ostomy and/or Continence Assessment and Care []  - 0 Incontinence Assessment and Management []  - 0 Ostomy Care Assessment and Management (repouching, etc.) PROCESS - Coordination of Care X -  Simple Patient / Family Education for ongoing care 1 15 []  - 0 Complex (extensive) Patient / Family Education for ongoing care []  - 0 Staff obtains Chiropractor, Records, T Results / Process Orders est []  - 0 Staff telephones HHA, Nursing Homes / Clarify orders / etc []  - 0 Routine Transfer to another Facility (non-emergent  condition) []  - 0 Routine Hospital Admission (non-emergent condition) []  - 0 New Admissions / Insurance Authorizations / Ordering NPWT Apligraf, etc. , []  - 0 Emergency Hospital Admission (emergent condition) Elizabeth [name], Elizabeth [name] ([id number]) [phone].pdf Page 3 of 10 X- 1 10 Simple Discharge Coordination []  - 0 Complex (extensive) Discharge Coordination PROCESS - Special Needs []  - 0 Pediatric / Minor Patient Management []  - 0 Isolation Patient Management []  - 0 Hearing / Language / Visual special needs []  - 0 Assessment of Community assistance (transportation, D/C planning, etc.) []  - 0 Additional assistance / Altered mentation []  - 0 Support Surface(s) Assessment (bed, cushion, seat, etc.) INTERVENTIONS - Wound Cleansing / Measurement []  - 0 Simple Wound Cleansing - one wound X- 2 5 Complex Wound Cleansing - multiple wounds X- 1 5 Wound Imaging (photographs - any number of wounds) []  - 0 Wound Tracing (instead of photographs) []  - 0 Simple Wound Measurement - one wound []  - 0 Complex Wound Measurement - multiple wounds INTERVENTIONS - Wound Dressings X - Small Wound Dressing one or multiple wounds 2 10 []  - 0 Medium Wound Dressing one or multiple wounds []  - 0 Large Wound Dressing one or multiple wounds []  - 0 Application of Medications - topical []  - 0 Application of Medications - injection INTERVENTIONS - Miscellaneous []  - 0 External ear exam []  - 0 Specimen Collection (cultures, biopsies, blood, body fluids, etc.) []  - 0 Specimen(s) / Culture(s) sent or taken to Lab for analysis []  - 0 Patient Transfer  (multiple staff / Nurse, adult / Similar devices) []  - 0 Simple Staple / Suture removal (25 or less) []  - 0 Complex Staple / Suture removal (26 or more) []  - 0 Hypo / Hyperglycemic Management (close monitor of Blood Glucose) []  - 0 Ankle / Brachial Index (ABI) - do not check if billed separately X- 1 5 Vital Signs Has the patient been seen at the hospital within the last three years: Yes Total Score: 95 Level Of Care: New/Established - Level 3 Electronic Signature(s) Signed: 10/19/2023 5:40:51 PM By: Betha Loa Entered By: Betha Loa on 10/18/2023 08:17:53 Elizabeth [name] ([id number]) [id number] Page 4 of 10 -------------------------------------------------------------------------------- Encounter Discharge Information Details Patient Name: Date of Service: Elizabeth [name], [place] Elizabeth LYN [name]. 10/18/2023 10:00 A M Medical Record Number: [id number] Patient Account Number: [id number] Date of Birth/Sex: Treating RN: December 27, 1950 (72 y.o. [name] Primary Care Xayvion Shirah: Darreld Mclean Other Clinician: Betha Loa Referring Thomas Mabry: Treating Milissa Fesperman/Extender: Salvatore Decent in Treatment: 4 Encounter Discharge Information Items Discharge Condition: Stable Ambulatory Status: Wheelchair Discharge Destination: Home Transportation: Private Auto Accompanied By: husband Schedule Follow-up Appointment: Yes Clinical Summary of Care: Electronic Signature(s) Signed: 10/19/2023 5:40:51 PM By: Betha Loa Entered By: Betha Loa on 10/18/2023 09:09:05 -------------------------------------------------------------------------------- Lower Extremity Assessment Details Patient Name: Date of Service: Elizabeth [name], [place] Elizabeth LYN [name]. 10/18/2023 10:00 A M Medical Record Number: [id number] Patient Account Number: [id number] Date of Birth/Sex: Treating RN: 12/13/1950 (72 y.o. [name] Primary Care Imaad Reuss: Darreld Mclean Other Clinician: Betha Loa Referring Alexandrina Fiorini: Treating Burnell Hurta/Extender: Salvatore Decent in Treatment: 4 Edema Assessment Assessed: [Left: Yes] Franne Forts: Yes] Edema: [Left: Yes] [Right: Yes] Calf Left: Right: Point of Measurement: 32 cm From Medial Instep 40 cm 36.5 cm Ankle Left: Right: Point of Measurement: 10 cm From Medial Instep 25 cm 24 cm Vascular Assessment Pulses: Dorsalis Pedis Palpable: [Left:Yes] [Right:Yes] Extremity colors, hair growth, and conditions: Extremity Color: [Left:Red] [Right:Normal] Hair Growth  on Extremity: [Left:Yes] [Right:Yes] Temperature of Extremity: [Left:Warm < 3 seconds] [Right:Warm < 3 seconds] Toe Nail Assessment Left: Right: Thick: Yes Yes Discolored: Yes Yes Elizabeth [name], Elizabeth [name] ([id number]) [phone].pdf Page 5 of 10 Deformed: No No Improper Length and Hygiene: No No Electronic Signature(s) Signed: 10/18/2023 4:50:41 PM By: Midge Aver MSN RN CNS WTA Signed: 10/19/2023 5:40:51 PM By: Betha Loa Entered By: Betha Loa on 10/18/2023 08:02:53 -------------------------------------------------------------------------------- Multi Wound Chart Details Patient Name: Date of Service: Elizabeth [name], [place] Elizabeth LYN [name]. 10/18/2023 10:00 A M Medical Record Number: [id number] Patient Account Number: [id number] Date of Birth/Sex: Treating RN: [birth year] (72 y.o. [name] Primary Care Rikia Sukhu: Darreld Mclean Other Clinician: Betha Loa Referring Dalayah Deahl: Treating Brexlee Heberlein/Extender: Salvatore Decent in Treatment: 4 Vital Signs Height(in): 63 Pulse(bpm): 49 Weight(lbs): 197 Blood Pressure(mmHg): 124/74 Body Mass Index(BMI): 34.9 Temperature(F): 97.6 Respiratory Rate(breaths/min): 18 [6:Photos:] [N/A:N/A] Right, Medial Lower Leg Left Lower Leg N/A Wound Location: Blister Blister N/A Wounding Event: Lymphedema Lymphedema N/A Primary Etiology: Lymphedema, Asthma, Arrhythmia, Lymphedema, Asthma,  Arrhythmia, N/A Comorbid History: Hypertension, Peripheral Venous Hypertension, Peripheral Venous Disease, Type II Diabetes, History of Disease, Type II Diabetes, History of pressure wounds, Osteomyelitis, pressure wounds, Osteomyelitis, Neuropathy, Seizure Disorder, Neuropathy, Seizure Disorder, Received Radiation Received Radiation 08/29/2023 08/29/2023 N/A Date Acquired: 4 4 N/A Weeks of Treatment: Healed - Epithelialized Healed - Epithelialized N/A Wound Status: No No N/A Wound Recurrence: 0x0x0 0x0x0 N/A Measurements [name] x W x D (cm) 0 0 N/A A (cm) : rea 0 0 N/A Volume (cm) : 100.00% 100.00% N/A % Reduction in Area: 100.00% 100.00% N/A % Reduction in Volume: Full Thickness Without Exposed Full Thickness Without Exposed N/A Classification: Support Structures Support Structures None Present None Present N/A Exudate A mount: None Present (0%) None Present (0%) N/A Granulation A mount: None Present (0%) Small (1-33%) N/A Necrotic A mount: Fat Layer (Subcutaneous Tissue): No Fat Layer (Subcutaneous Tissue): No N/A Exposed Structures: Large (67-100%) Large (67-100%) N/A Epithelialization: Treatment Notes Elizabeth [name], Elizabeth [name] ([id number]) [id number] Page 6 of 10 Electronic Signature(s) Signed: 10/19/2023 5:40:51 PM By: Betha Loa Entered By: Betha Loa on 10/18/2023 08:13:11 -------------------------------------------------------------------------------- Multi-Disciplinary Care Plan Details Patient Name: Date of Service: Elizabeth [name], [place] Elizabeth LYN [name]. 10/18/2023 10:00 A M Medical Record Number: [id number] Patient Account Number: [id number] Date of Birth/Sex: Treating RN: 1951-09-19 (72 y.o. [name] Primary Care Attila Mccarthy: Darreld Mclean Other Clinician: Betha Loa Referring Kaylan Yates: Treating Kam Kushnir/Extender: Salvatore Decent in Treatment: 4 Active Inactive Electronic Signature(s) Signed: 10/18/2023 4:50:41 PM By:  Midge Aver MSN RN CNS WTA Signed: 10/19/2023 5:40:51 PM By: Betha Loa Entered By: Betha Loa on 10/18/2023 09:08:22 -------------------------------------------------------------------------------- Pain Assessment Details Patient Name: Date of Service: Elizabeth [name], [place] Elizabeth LYN [name]. 10/18/2023 10:00 A M Medical Record Number: [id number] Patient Account Number: [id number] Date of Birth/Sex: Treating RN: [birth year] (72 y.o. [name] Primary Care Seraphim Affinito: Darreld Mclean Other Clinician: Betha Loa Referring Nehemias Sauceda: Treating Arnell Mausolf/Extender: Salvatore Decent in Treatment: 4 Active Problems Location of Pain Severity and Description of Pain Patient Has Paino No Site Locations Elizabeth [name], Elizabeth [name] ([id number]) [id number].pdf Page 7 of 10 Pain Management and Medication Current Pain Management: Electronic Signature(s) Signed: 10/18/2023 4:50:41 PM By: Midge Aver MSN RN CNS WTA Signed: 10/19/2023 5:40:51 PM By: Betha Loa Entered By: Betha Loa on 10/18/2023 07:44:03 -------------------------------------------------------------------------------- Patient/Caregiver Education Details Patient Name: Date of Service: Elizabeth [name], [place] Elizabeth LYN [name]. 12/10/2024andnbsp10:00 A M Medical Record Number: [id number]  Patient Account Number: [id number] Date of Birth/Gender: Treating RN: 04/03/1951 (72 y.o. [name] Primary Care Physician: Darreld Mclean Other Clinician: Betha Loa Referring Physician: Treating Physician/Extender: Salvatore Decent in Treatment: 4 Education Assessment Education Provided To: Patient and Caregiver Education Topics Provided Wound/Skin Impairment: Handouts: Other: Congratulations, your wounds have healed. Please call if any further issues arise Methods: Explain/Verbal Responses: State content correctly Electronic Signature(s) Signed: 10/19/2023 5:40:51 PM By: Betha Loa Entered By:  Betha Loa on 10/18/2023 09:08:27 Elizabeth [name] ([id number]) [id number] Page 8 of 10 -------------------------------------------------------------------------------- Wound Assessment Details Patient Name: Date of Service: Elizabeth [name], [place] Elizabeth LYN [name]. 10/18/2023 10:00 A M Medical Record Number: [id number] Patient Account Number: [id number] Date of Birth/Sex: Treating RN: December 06, 1950 (72 y.o. [name] Primary Care Ryian Lynde: Darreld Mclean Other Clinician: Betha Loa Referring Anh Mangano: Treating Chlora Mcbain/Extender: Salvatore Decent in Treatment: 4 Wound Status Wound Number: 6 Primary Lymphedema Etiology: Wound Location: Right, Medial Lower Leg Wound Healed - Epithelialized Wounding Event: Blister Status: Date Acquired: 08/29/2023 Comorbid Lymphedema, Asthma, Arrhythmia, Hypertension, Peripheral Weeks Of Treatment: 4 History: Venous Disease, Type II Diabetes, History of pressure wounds, Clustered Wound: No Osteomyelitis, Neuropathy, Seizure Disorder, Received Radiation Photos Wound Measurements Length: (cm) Width: (cm) Depth: (cm) Area: (cm) Volume: (cm) 0 % Reduction in Area: 100% 0 % Reduction in Volume: 100% 0 Epithelialization: Large (67-100%) 0 0 Wound Description Classification: Full Thickness Without Exposed Support Exudate Amount: None Present Structures Foul Odor After Cleansing: No Slough/Fibrino No Wound Bed Granulation Amount: None Present (0%) Exposed Structure Necrotic Amount: None Present (0%) Fat Layer (Subcutaneous Tissue) Exposed: No Treatment Notes Wound #6 (Lower Leg) Wound Laterality: Right, Medial Cleanser Peri-Wound Care Topical Primary Dressing Secondary Dressing Secured With Elizabeth [name], Elizabeth [name] ([id number]) [id number] Page 9 of 10 Compression Wrap Compression Stockings Add-Ons Electronic Signature(s) Signed: 10/18/2023 4:50:41 PM By: Midge Aver MSN RN CNS  WTA Signed: 10/19/2023 5:40:51 PM By: Betha Loa Entered By: Betha Loa on 10/18/2023 08:12:33 -------------------------------------------------------------------------------- Wound Assessment Details Patient Name: Date of Service: Elizabeth [name], [place] Elizabeth LYN [name]. 10/18/2023 10:00 A M Medical Record Number: [id number] Patient Account Number: [id number] Date of Birth/Sex: Treating RN: 10/01/1951 (72 y.o. [name] Primary Care Grizel Vesely: Darreld Mclean Other Clinician: Betha Loa Referring Anayiah Howden: Treating Jakoby Melendrez/Extender: Salvatore Decent in Treatment: 4 Wound Status Wound Number: 7 Primary Lymphedema Etiology: Wound Location: Left Lower Leg Wound Healed - Epithelialized Wounding Event: Blister Status: Date Acquired: 08/29/2023 Comorbid Lymphedema, Asthma, Arrhythmia, Hypertension, Peripheral Weeks Of Treatment: 4 History: Venous Disease, Type II Diabetes, History of pressure wounds, Clustered Wound: No Osteomyelitis, Neuropathy, Seizure Disorder, Received Radiation Photos Wound Measurements Length: (cm) Width: (cm) Depth: (cm) Area: (cm) Volume: (cm) 0 % Reduction in Area: 100% 0 % Reduction in Volume: 100% 0 Epithelialization: Large (67-100%) 0 0 Wound Description Classification: Full Thickness Without Exposed Support Exudate Amount: None Present Structures Foul Odor After Cleansing: No Slough/Fibrino No Wound Bed Granulation Amount: None Present (0%) Exposed Structure Necrotic Amount: Small (1-33%) Fat Layer (Subcutaneous Tissue) Exposed: No Treatment Notes Elizabeth [name], Elizabeth [name] ([id number]) [id number] Page 10 of 10 Wound #7 (Lower Leg) Wound Laterality: Left Cleanser Peri-Wound Care Topical Primary Dressing Secondary Dressing Secured With Compression Wrap Compression Stockings Add-Ons Electronic Signature(s) Signed: 10/18/2023 4:50:41 PM By: Midge Aver MSN RN CNS WTA Signed: 10/19/2023 5:40:51 PM By:  Betha Loa Entered By: Betha Loa on 10/18/2023 08:12:54 -------------------------------------------------------------------------------- Vitals Details Patient Name: Date of Service: Elizabeth [name], [place] Elizabeth LYN  [name]. 10/18/2023 10:00 A M Medical Record Number: [id number] Patient Account Number: [id number] Date of Birth/Sex: Treating RN: 1950-11-13 (72 y.o. [name] Primary Care Travian Kerner: Darreld Mclean Other Clinician: Betha Loa Referring Ranny Wiebelhaus: Treating Linnette Panella/Extender: Salvatore Decent in Treatment: 4 Vital Signs Time Taken: 10:42 Temperature (F): 97.6 Height (in): 63 Pulse (bpm): 49 Weight (lbs): 197 Respiratory Rate (breaths/min): 18 Body Mass Index (BMI): 34.9 Blood Pressure (mmHg): 124/74 Reference Range: 80 - 120 mg / dl Electronic Signature(s) Signed: 10/19/2023 5:40:51 PM By: Betha Loa Entered By: Betha Loa on 10/18/2023 07:43:59

## 2023-10-18 NOTE — Progress Notes (Signed)
PASHION, AUDIBERT ([id number]) [id number] Page 1 of 6 Visit Report for 10/18/2023 Chief Complaint Document Details Patient Name: Date of Service: Elizabeth [name], [place] [name] 10/18/2023 10:00 A M Medical Record Number: [id number] Patient Account Number: [id number] Date of Birth/Sex: Treating RN: 12/01/1950 (72 y.o. Elizabeth [name] Primary Care Provider: Darreld Mclean Other Clinician: Betha Loa Referring Provider: Treating Provider/Extender: Salvatore Decent in Treatment: 4 Information Obtained from: Patient Chief Complaint Bilateral LE Ulcers Electronic Signature(s) Signed: 10/18/2023 9:43:45 AM By: Allen Derry PA-C Entered By: Allen Derry on 10/18/2023 09:43:45 -------------------------------------------------------------------------------- HPI Details Patient Name: Date of Service: Elizabeth [name], [place] [name] 10/18/2023 10:00 A M Medical Record Number: [id number] Patient Account Number: [id number] Date of Birth/Sex: Treating RN: 1951/05/22 (72 y.o. Elizabeth [name] Primary Care Provider: Darreld Mclean Other Clinician: Betha Loa Referring Provider: Treating Provider/Extender: Salvatore Decent in Treatment: 4 History of Present Illness HPI Description: Readmission: 09-15-2023 upon evaluation today patient presents for readmission here in the clinic although she was seen just recently although it was for an area on her breast. Subsequently at this point patient's wound actually is on her legs she has bilateral lower extremity lymphedema and she has bilateral lower extremity swelling. She seems to have good arterial flow visually although we could not perform ABIs today due to the location of the wounds. Patient states this started about 2 weeks prior as far as the weeping is concerned she does not wear compression socks as they are too tight to get on she tells me. Patient does have a history of diabetes mellitus type 2, diabetic  neuropathy, chronic kidney disease stage IIIb, atrial fibrillation and she is on Eliquis for anticoagulation. Her most recent hemoglobin A1c was 6.5. 09-22-2023 upon evaluation patient appears to be doing better in regard to both wounds from a sinus perspective she also does have the results of the culture today which I went over with her and showed Pseudomonas that the predominant organism. Subsequently we will get a go ahead and address this as rapidly as possible I am going to go ahead and get her started on Levaquin and I want to get that started today. 09-29-2023 upon evaluation today patient appears to be doing well currently in regard to the wounds on her legs the right leg looks like it may be just about healed the left leg is significantly improved. Actually I am very pleased with where we stand today and I think that she is making good headway here towards closure which is great news. [name], [name] ([id number]) [id number] Page 2 of 6 10-18-2023 upon evaluation today patient appears to be doing well currently in regard to her wound which in fact appears to be completely healed based on what we are seeing. Fortunately I do not see any signs of infection at this time either which is also excellent news. Electronic Signature(s) Signed: 10/19/2023 7:39:05 PM By: Allen Derry PA-C Entered By: Allen Derry on 10/19/2023 19:39:05 -------------------------------------------------------------------------------- Physical Exam Details Patient Name: Date of Service: Elizabeth [name], [place] [name] 10/18/2023 10:00 A M Medical Record Number: [id number] Patient Account Number: [id number] Date of Birth/Sex: Treating RN: 1951-03-01 (72 y.o. Elizabeth [name] Primary Care Provider: Darreld Mclean Other Clinician: Betha Loa Referring Provider: Treating Provider/Extender: Salvatore Decent in Treatment: 4 Constitutional Obese and well-hydrated in no acute  distress. Respiratory normal breathing without difficulty. Psychiatric this patient is able to make decisions and demonstrates good  insight into disease process. Alert and Oriented x 3. pleasant and cooperative. Notes Patient's wound bed showed signs of complete epithelization and I do not see anything open at this point which is great news. In general I think that we are making excellent headway here as far as not only healing which is already done now and keeping this closed she knows what to do going forward. Electronic Signature(s) Signed: 10/19/2023 7:39:22 PM By: Allen Derry PA-C Entered By: Allen Derry on 10/19/2023 19:39:22 -------------------------------------------------------------------------------- Physician Orders Details Patient Name: Date of Service: Elizabeth [name], [place] [name] 10/18/2023 10:00 A M Medical Record Number: [id number] Patient Account Number: [id number] Date of Birth/Sex: Treating RN: Dec 31, 1950 (72 y.o. Elizabeth [name] Primary Care Provider: Darreld Mclean Other Clinician: Betha Loa Referring Provider: Treating Provider/Extender: Salvatore Decent in Treatment: 4 The following information was scribed by: Betha Loa The information was scribed for: Allen Derry Verbal / Phone Orders: No LAKEIA, TONN ([id number]) [id number] Page 3 of 6 Diagnosis Coding ICD-10 Coding Code Description I89.0 Lymphedema, not elsewhere classified E11.622 Type 2 diabetes mellitus with other skin ulcer L97.822 Non-pressure chronic ulcer of other part of left lower leg with fat layer exposed L97.512 Non-pressure chronic ulcer of other part of right foot with fat layer exposed E11.43 Type 2 diabetes mellitus with diabetic autonomic (poly)neuropathy N18.32 Chronic kidney disease, stage 3b I48.0 Paroxysmal atrial fibrillation Z79.01 Long term (current) use of anticoagulants Discharge From [facility] Services Discharge from Wound Care  Center Treatment Complete Wear compression garments daily. Put garments on first thing when you wake up and remove them before bed. Moisturize legs daily after removing compression garments. Elevate, Exercise Daily and A void Standing for Long Periods of Time. Electronic Signature(s) Signed: 10/19/2023 5:40:51 PM By: Betha Loa Signed: 10/19/2023 7:54:12 PM By: Allen Derry PA-C Entered By: Betha Loa on 10/18/2023 11:16:05 -------------------------------------------------------------------------------- Problem List Details Patient Name: Date of Service: Elizabeth [name], [place] [name] 10/18/2023 10:00 A M Medical Record Number: [id number] Patient Account Number: [id number] Date of Birth/Sex: Treating RN: 09/12/1951 (72 y.o. Elizabeth [name] Primary Care Provider: Darreld Mclean Other Clinician: Betha Loa Referring Provider: Treating Provider/Extender: Salvatore Decent in Treatment: 4 Active Problems ICD-10 Encounter Code Description Active Date MDM Diagnosis I89.0 Lymphedema, not elsewhere classified 09/15/2023 No Yes E11.622 Type 2 diabetes mellitus with other skin ulcer 09/15/2023 No Yes L97.822 Non-pressure chronic ulcer of other part of left lower leg with fat layer exposed 09/15/2023 No Yes L97.512 Non-pressure chronic ulcer of other part of right foot with fat layer exposed 09/15/2023 No Yes E11.43 Type 2 diabetes mellitus with diabetic autonomic (poly)neuropathy 09/15/2023 No Yes N18.32 Chronic kidney disease, stage 3b 09/15/2023 No Yes Bartnick, Eleanna L ([id number]) [id number] Page 4 of 6 I48.0 Paroxysmal atrial fibrillation 09/15/2023 No Yes Z79.01 Long term (current) use of anticoagulants 09/15/2023 No Yes Inactive Problems Resolved Problems Electronic Signature(s) Signed: 10/18/2023 9:43:37 AM By: Allen Derry PA-C Entered By: Allen Derry on 10/18/2023  09:43:37 -------------------------------------------------------------------------------- Progress Note Details Patient Name: Date of Service: Elizabeth [name], CA [name] 10/18/2023 10:00 A M Medical Record Number: [id number] Patient Account Number: [id number] Date of Birth/Sex: Treating RN: 1951/05/29 (72 y.o. Elizabeth [name] Primary Care Provider: Darreld Mclean Other Clinician: Betha Loa Referring Provider: Treating Provider/Extender: Salvatore Decent in Treatment: 4 Subjective Chief Complaint Information obtained from Patient Bilateral LE Ulcers History of Present Illness (HPI) Readmission: 09-15-2023 upon evaluation today patient presents for readmission  here in the clinic although she was seen just recently although it was for an area on her breast. Subsequently at this point patient's wound actually is on her legs she has bilateral lower extremity lymphedema and she has bilateral lower extremity swelling. She seems to have good arterial flow visually although we could not perform ABIs today due to the location of the wounds. Patient states this started about 2 weeks prior as far as the weeping is concerned she does not wear compression socks as they are too tight to get on she tells me. Patient does have a history of diabetes mellitus type 2, diabetic neuropathy, chronic kidney disease stage IIIb, atrial fibrillation and she is on Eliquis for anticoagulation. Her most recent hemoglobin A1c was 6.5. 09-22-2023 upon evaluation patient appears to be doing better in regard to both wounds from a sinus perspective she also does have the results of the culture today which I went over with her and showed Pseudomonas that the predominant organism. Subsequently we will get a go ahead and address this as rapidly as possible I am going to go ahead and get her started on Levaquin and I want to get that started today. 09-29-2023 upon evaluation today patient appears to be doing well  currently in regard to the wounds on her legs the right leg looks like it may be just about healed the left leg is significantly improved. Actually I am very pleased with where we stand today and I think that she is making good headway here towards closure which is great news. 10-18-2023 upon evaluation today patient appears to be doing well currently in regard to her wound which in fact appears to be completely healed based on what we are seeing. Fortunately I do not see any signs of infection at this time either which is also excellent news. Allergies Levaquin (Severity: Severe, Reaction: seizures) OCTAVIA, METRO ([id number]) [id number] Page 5 of 6 Objective Constitutional Obese and well-hydrated in no acute distress. Vitals Time Taken: 10:42 AM, Height: 63 in, Weight: 197 lbs, BMI: 34.9, Temperature: 97.6 F, Pulse: 49 bpm, Respiratory Rate: 18 breaths/min, Blood Pressure: 124/74 mmHg. Respiratory normal breathing without difficulty. Psychiatric this patient is able to make decisions and demonstrates good insight into disease process. Alert and Oriented x 3. pleasant and cooperative. General Notes: Patient's wound bed showed signs of complete epithelization and I do not see anything open at this point which is great news. In general I think that we are making excellent headway here as far as not only healing which is already done now and keeping this closed she knows what to do going forward. Integumentary (Hair, Skin) Wound #6 status is Healed - Epithelialized. Original cause of wound was Blister. The date acquired was: 08/29/2023. The wound has been in treatment 4 weeks. The wound is located on the Right,Medial Lower Leg. The wound measures 0cm length x 0cm width x 0cm depth; 0cm^2 area and 0cm^3 volume. There is a none present amount of drainage noted. There is no granulation within the wound bed. There is no necrotic tissue within the wound bed. Wound #7  status is Healed - Epithelialized. Original cause of wound was Blister. The date acquired was: 08/29/2023. The wound has been in treatment 4 weeks. The wound is located on the Left Lower Leg. The wound measures 0cm length x 0cm width x 0cm depth; 0cm^2 area and 0cm^3 volume. There is a none present amount of drainage noted. There is no granulation within the wound  bed. There is a small (1-33%) amount of necrotic tissue within the wound bed. 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 Put garments on first thing when you wake up and remove them before bed. Moisturize legs daily after removing compression garments. Elevate, Exercise Daily and Avoid Standing for Long Periods of Time. 1. I would recommend that we have the patient going to continue to utilize her own compression at home. She should be put these on first thing the morning removing them before bed. 2. Also can recommend moisturizing the legs daily at night. 3. I will also recommend continued exercise, elevation, and we need to make sure that we keep the swelling under control so we keep wounds from occurring as well. We will see her back for a follow-up visit as needed. Electronic Signature(s) Signed: 10/19/2023 7:40:06 PM By: Allen Derry PA-C Entered By: Allen Derry on 10/19/2023 19:40:05 [name] ([id number]) [id number] Page 6 of  6 -------------------------------------------------------------------------------- SuperBill Details Patient Name: Date of Service: Elizabeth [name], [place] [name] 10/18/2023 Medical Record Number: [id number] Patient Account Number: [id number] Date of Birth/Sex: Treating RN: [birth year] (72 y.o. Elizabeth [name] Primary Care Provider: Darreld Mclean Other Clinician: Betha Loa Referring Provider: Treating Provider/Extender: Salvatore Decent in Treatment: 4 Diagnosis Coding ICD-10 Codes Code Description I89.0 Lymphedema, not elsewhere classified E11.622 Type 2 diabetes mellitus with other skin ulcer L97.822 Non-pressure chronic ulcer of other part of left lower leg with fat layer exposed L97.512 Non-pressure chronic ulcer of other part of right foot with fat layer exposed E11.43 Type 2 diabetes mellitus with diabetic autonomic (poly)neuropathy N18.32 Chronic kidney disease, stage 3b I48.0 Paroxysmal atrial fibrillation Z79.01 Long term (current) use of anticoagulants Facility Procedures : CPT4 Code: [id number] Description: 99213 - WOUND CARE VISIT-LEV 3 EST PT Modifier: Quantity: 1 Physician Procedures : CPT4 Code Description Modifier [id number] 99213 - WC PHYS LEVEL 3 - EST PT ICD-10 Diagnosis Description I89.0 Lymphedema, not elsewhere classified E11.622 Type 2 diabetes mellitus with other skin ulcer L97.822 Non-pressure chronic ulcer of other part of  left lower leg with fat layer exposed L97.512 Non-pressure chronic ulcer of other part of right foot with fat layer exposed Quantity: 1 Electronic Signature(s) Signed: 10/19/2023 7:41:17 PM By: Allen Derry PA-C Previous Signature: 10/19/2023 5:40:51 PM Version By: Betha Loa Entered By: Allen Derry on 10/19/2023 19:41:17

## 2023-10-19 ENCOUNTER — Telehealth: Payer: Self-pay | Admitting: Cardiology

## 2023-10-19 NOTE — Telephone Encounter (Signed)
Lvm to confirm pt appt 10/20/23

## 2023-10-20 ENCOUNTER — Encounter: Payer: Medicare PPO | Admitting: Cardiology

## 2023-10-21 ENCOUNTER — Other Ambulatory Visit: Payer: Self-pay

## 2023-10-21 ENCOUNTER — Other Ambulatory Visit (HOSPITAL_COMMUNITY): Payer: Self-pay

## 2023-10-21 ENCOUNTER — Telehealth: Payer: Self-pay | Admitting: Pharmacist

## 2023-10-21 MED ORDER — TAFAMIDIS MEGLUMINE (CARDIAC) 20 MG PO CAPS
80.0000 mg | ORAL_CAPSULE | Freq: Every day | ORAL | 11 refills | Status: DC
  Filled 2023-10-21 – 2023-11-16 (×2): qty 120, 30d supply, fill #0
  Filled 2023-12-06: qty 120, 30d supply, fill #1

## 2023-10-21 NOTE — Telephone Encounter (Signed)
Re-entered tafamidis prescription.

## 2023-10-25 ENCOUNTER — Ambulatory Visit: Payer: Medicare PPO | Attending: Cardiology | Admitting: Cardiology

## 2023-10-25 VITALS — BP 114/77 | HR 59 | Wt 175.6 lb

## 2023-10-25 DIAGNOSIS — Z8673 Personal history of transient ischemic attack (TIA), and cerebral infarction without residual deficits: Secondary | ICD-10-CM | POA: Diagnosis not present

## 2023-10-25 DIAGNOSIS — Z79899 Other long term (current) drug therapy: Secondary | ICD-10-CM | POA: Diagnosis not present

## 2023-10-25 DIAGNOSIS — Z8679 Personal history of other diseases of the circulatory system: Secondary | ICD-10-CM | POA: Insufficient documentation

## 2023-10-25 DIAGNOSIS — E785 Hyperlipidemia, unspecified: Secondary | ICD-10-CM | POA: Insufficient documentation

## 2023-10-25 DIAGNOSIS — Z7984 Long term (current) use of oral hypoglycemic drugs: Secondary | ICD-10-CM | POA: Diagnosis not present

## 2023-10-25 DIAGNOSIS — E1122 Type 2 diabetes mellitus with diabetic chronic kidney disease: Secondary | ICD-10-CM | POA: Insufficient documentation

## 2023-10-25 DIAGNOSIS — I480 Paroxysmal atrial fibrillation: Secondary | ICD-10-CM | POA: Diagnosis not present

## 2023-10-25 DIAGNOSIS — I130 Hypertensive heart and chronic kidney disease with heart failure and stage 1 through stage 4 chronic kidney disease, or unspecified chronic kidney disease: Secondary | ICD-10-CM | POA: Insufficient documentation

## 2023-10-25 DIAGNOSIS — Z7901 Long term (current) use of anticoagulants: Secondary | ICD-10-CM | POA: Insufficient documentation

## 2023-10-25 DIAGNOSIS — I5032 Chronic diastolic (congestive) heart failure: Secondary | ICD-10-CM | POA: Insufficient documentation

## 2023-10-25 DIAGNOSIS — N184 Chronic kidney disease, stage 4 (severe): Secondary | ICD-10-CM | POA: Diagnosis not present

## 2023-10-25 DIAGNOSIS — R0602 Shortness of breath: Secondary | ICD-10-CM | POA: Diagnosis present

## 2023-10-25 MED ORDER — POTASSIUM CHLORIDE ER 10 MEQ PO TBCR: 20.0000 meq | EXTENDED_RELEASE_TABLET | Freq: Every day | ORAL | 3 refills | Status: DC

## 2023-10-25 MED ORDER — APIXABAN 5 MG PO TABS: 5.0000 mg | ORAL_TABLET | Freq: Two times a day (BID) | ORAL | 3 refills | Status: DC

## 2023-10-25 MED ORDER — PRAVASTATIN SODIUM 40 MG PO TABS: 40.0000 mg | ORAL_TABLET | Freq: Every day | ORAL | 3 refills | Status: DC

## 2023-10-25 MED ORDER — DAPAGLIFLOZIN PROPANEDIOL 10 MG PO TABS: 10.0000 mg | ORAL_TABLET | Freq: Every day | ORAL | 1 refills | Status: DC

## 2023-10-25 MED ORDER — MIDODRINE HCL 5 MG PO TABS: 5.0000 mg | ORAL_TABLET | Freq: Three times a day (TID) | ORAL | 0 refills | Status: DC

## 2023-10-25 MED ORDER — FUROSEMIDE 20 MG PO TABS: 40.0000 mg | ORAL_TABLET | Freq: Every day | ORAL | 3 refills | Status: DC

## 2023-10-25 NOTE — Progress Notes (Signed)
PCP: Dr Marvis Moeller  Cardiologist: Dr Isac Sarna HF Cardiologist: Dr Elwyn Lade   Chief Complaint: Heart Failure   HPI: [name] is a 72 year old with a history of seizure, HFpEF, cardiac amyloid, TTR, HTN, HLD, chronic a fib, CKD stage III.   First seen by cardiology in 07/2021 after having been admitted to the hospital in 04/2021 for TIA. Echo during that admission showed an EF of 60 to 65%, no regional wall motion abnormalities, normal LV diastolic function parameters, normal RV systolic function and ventricular cavity size, mild mitral regurgitation, and negative bubble study. Carotid artery ultrasound showed right greater than left carotid bifurcation atherosclerosis with less than 50% stenosis. Subsequent outpatient cardiac monitoring showed a predominant rhythm of sinus with first-degree AV block, second-degree AV block type I, 2 pauses with the longest being 3.4 seconds, rare atrial and ventricular ectopy, and no evidence of A-fib or flutter.   Admitted 06/2023 with syncope and was found to be in A fib. EP consulted for cardioversion and possible PPM but had heart failure exacerbation. She was referred to Advanced Heart Failure  HF Team work up for amyloid started. PYP + . Started on Tafamidis.   Readmitted in November with seizure like activity. Possibly related to levaquin +/- AKI. Levaquin was  stopped. HF Team consulted. HF meds for discharge lasix 20 mg daily  farxiga, midodrine 5 mg tid, and eliquis 5 mg twice a day. Discharged to [facility] 10/10/23.   Today she returns for HF follow up with her husband.  Recently discharged from SNF. She tells me she fells a little stronger. Occasionally short of breath with exertion. Denies PND/Orthopnea. No seizure activity. No bleeding issues. Appetite poor. No fever or chills. Takes a few steps at home. Her husband helps with ADLs. Unable to weight at home.  Taking all medications.   ROS: All systems negative except as listed in HPI, PMH and Problem  List.  SH:  Social History   Socioeconomic History   Marital status: Married    Spouse name: Elizabeth [name]   Number of children: Not on file   Years of education: Not on file   Highest education level: Not on file  Occupational History   Not on file  Tobacco Use   Smoking status: Never    Passive exposure: Never   Smokeless tobacco: Never  Vaping Use   Vaping status: Never Used  Substance and Sexual Activity   Alcohol use: No    Alcohol/week: 0.0 standard drinks of alcohol   Drug use: No   Sexual activity: Yes    Birth control/protection: Post-menopausal  Other Topics Concern   Not on file  Social History Narrative   Not on file   Social Drivers of Health   Financial Resource Strain: Not on file  Food Insecurity: No Food Insecurity (10/03/2023)   Hunger Vital Sign    Worried About Running Out of Food in the Last Year: Never true    Ran Out of Food in the Last Year: Never true  Transportation Needs: No Transportation Needs (10/03/2023)   PRAPARE - Administrator, Civil Service (Medical): No    Lack of Transportation (Non-Medical): No  Physical Activity: Not on file  Stress: Not on file  Social Connections: Not on file  Intimate Partner Violence: Not At Risk (10/03/2023)   Humiliation, Afraid, Rape, and Kick questionnaire    Fear of Current or Ex-Partner: No    Emotionally Abused: No    Physically Abused: No  Sexually Abused: No    FH:  Family History  Problem Relation Age of Onset   Diabetes Mother    Cancer Mother    Breast cancer Mother 26   Diabetes Father    Diabetes Sister    Diabetes Brother     Past Medical History:  Diagnosis Date   Cancer [facility])    breast   Carcinoma of right breast (HCC) 05/20/2014   Diabetes mellitus without complication (HCC)    Hypertension    Lymphedema    Personal history of radiation therapy 2015   Right breast   PONV (postoperative nausea and vomiting)    Psoriasis 1990   Stroke (HCC) 2000    residual left arm weakness   Varicose veins     Current Outpatient Medications  Medication Sig Dispense Refill   acetaminophen (TYLENOL) 325 MG tablet Take 2 tablets (650 mg total) by mouth every 6 (six) hours as needed for mild pain (or Fever >/= 101).     apixaban (ELIQUIS) 5 MG TABS tablet Take 1 tablet (5 mg total) by mouth 2 (two) times daily. 180 tablet 3   Calcium Carb-Cholecalciferol (CALCIUM 1000 + D PO) Take 1 tablet by mouth daily.     cloBAZam (ONFI) 2.5 MG/ML solution Take 2 mLs (5 mg total) by mouth 2 (two) times daily. 120 mL 1   dapagliflozin propanediol (FARXIGA) 10 MG TABS tablet Take 1 tablet (10 mg total) by mouth daily. 30 tablet 1   furosemide (LASIX) 20 MG tablet Take 1 tablet (20 mg total) by mouth daily. 30 tablet 1   gabapentin (NEURONTIN) 300 MG capsule Take 300 mg by mouth at bedtime as needed (leg pain).     midodrine (PROAMATINE) 5 MG tablet Take 1 tablet (5 mg total) by mouth 3 (three) times daily with meals. 30 tablet 0   potassium chloride (KLOR-CON) 10 MEQ tablet Take 2 tablets (20 mEq total) by mouth daily. 180 tablet 3   pravastatin (PRAVACHOL) 40 MG tablet Take 40 mg by mouth daily. In afternoon     Tafamidis Meglumine, Cardiac, 20 MG CAPS Take 4 capsules (80 mg total) by mouth daily. 120 capsule 11   TRUE METRIX BLOOD GLUCOSE TEST test strip      TRUEPLUS LANCETS 33G MISC      No current facility-administered medications for this visit.    Vitals:   10/25/23 1033  BP: 114/77  Pulse: (!) 59  SpO2: 100%  Weight: 175 lb 9.6 oz (79.7 kg)    PHYSICAL EXAM: General:  Sitting in the wheelchair. . No resp difficulty HEENT: normal Neck: supple. JVP 9-10 . Carotids 2+ bilaterally; no bruits. No lymphadenopathy or thryomegaly appreciated. Cor: PMI normal. Irregular rate & rhythm. No rubs, gallops or murmurs. Lungs: clear Abdomen: soft, nontender, nondistended. No hepatosplenomegaly. No bruits or masses. Good bowel sounds. Extremities: no cyanosis,  clubbing, rash, R and LLE trace-1+ edema Neuro: alert & orientedx3, cranial nerves grossly intact. Moves all 4 extremities w/o difficulty. Affect pleasant.  ECG: A fib 66 bpm    ASSESSMENT & PLAN: 1. Chronic HFpEF Cardiac Amyloid, TTR Echo EF 55-60% RV Normal . 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 Volume status mildly elevated. Increase lasix 60 mg daily x2 days then back to 40 mg daily.  -Continue midodrin 5 mg three times a day  -Continue Tafamidis 80 mg daily - No MRA with CKD Stage IV.  Check BMET   2. A Fib  -Controlled rate. No  bb, heart rate 50s.  -Refill eliquis 5 mg twice a day.  -She has follow up with EP in January.   3. CKD Stage IV -Check BMET. Looks like her creatinine baseline is ~ 2.  -Discussed avoiding NSAIDs  Follow up in 8 weeks with Dr Lucretia Field NP-C  11:39 AM

## 2023-10-25 NOTE — Patient Instructions (Signed)
Medication Changes:  INCREASE LASIX (FUROSEMIDE) TO 60MG  FOR THE NEXT 2 DAYS THEN RETURN TO 40MG  ONCE DAILY   Lab Work:  Go DOWN to LOWER LEVEL (LL) to have your blood work completed inside of [employer] office.  We will only call you if the results are abnormal or if the provider would like to make medication changes.  Follow-Up in: 2 MONTHS AS SCHEDULED   If you have any questions or concerns before your next appointment please send [place] a message through mychart or call our office at [phone] Monday-Friday 8 am-5 pm.   If you have an urgent need after hours on the weekend please call your Primary Cardiologist or the [facility] in [place] at [phone].   At the [facility], you and your health needs are our priority. We have a designated team specialized in the treatment of Heart Failure. This Care Team includes your primary Heart Failure Specialized Cardiologist (physician), Advanced Practice Providers (APPs- Physician Assistants and Nurse Practitioners), and Pharmacist who all work together to provide you with the care you need, when you need it.   You may see any of the following providers on your designated Care Team at your next follow up:  Dr. Arvilla Meres Dr. Marca Ancona Dr. Dorthula Nettles Dr. Theresia Bough Tonye Becket, NP Robbie Lis, [place] [address] [place], [place] Brynda Peon, NP [place] Lee, NP Clarisa Kindred, NP Enos Fling, PharmD

## 2023-10-26 LAB — BASIC METABOLIC PANEL
BUN/Creatinine Ratio: 15 (ref 12–28)
BUN: 34 mg/dL — ABNORMAL HIGH (ref 8–27)
CO2: 23 mmol/L (ref 20–29)
Calcium: 10 mg/dL (ref 8.7–10.3)
Chloride: 96 mmol/L (ref 96–106)
Creatinine, Ser: 2.28 mg/dL — ABNORMAL HIGH (ref 0.57–1.00)
Glucose: 165 mg/dL — ABNORMAL HIGH (ref 70–99)
Potassium: 4.6 mmol/L (ref 3.5–5.2)
Sodium: 138 mmol/L (ref 134–144)
eGFR: 22 mL/min/{1.73_m2} — ABNORMAL LOW (ref >59)

## 2023-11-10 ENCOUNTER — Encounter: Payer: Self-pay | Admitting: Cardiology

## 2023-11-10 ENCOUNTER — Ambulatory Visit: Payer: Medicare PPO | Attending: Cardiology | Admitting: Cardiology

## 2023-11-10 VITALS — BP 110/73 | HR 60 | Ht 63.0 in | Wt 170.4 lb

## 2023-11-10 DIAGNOSIS — I480 Paroxysmal atrial fibrillation: Secondary | ICD-10-CM

## 2023-11-10 DIAGNOSIS — N184 Chronic kidney disease, stage 4 (severe): Secondary | ICD-10-CM | POA: Diagnosis not present

## 2023-11-10 DIAGNOSIS — I739 Peripheral vascular disease, unspecified: Secondary | ICD-10-CM

## 2023-11-10 DIAGNOSIS — E854 Organ-limited amyloidosis: Secondary | ICD-10-CM

## 2023-11-10 DIAGNOSIS — I872 Venous insufficiency (chronic) (peripheral): Secondary | ICD-10-CM

## 2023-11-10 DIAGNOSIS — I43 Cardiomyopathy in diseases classified elsewhere: Secondary | ICD-10-CM

## 2023-11-10 DIAGNOSIS — I5032 Chronic diastolic (congestive) heart failure: Secondary | ICD-10-CM | POA: Diagnosis not present

## 2023-11-10 DIAGNOSIS — I10 Essential (primary) hypertension: Secondary | ICD-10-CM

## 2023-11-10 DIAGNOSIS — I639 Cerebral infarction, unspecified: Secondary | ICD-10-CM

## 2023-11-10 DIAGNOSIS — E1151 Type 2 diabetes mellitus with diabetic peripheral angiopathy without gangrene: Secondary | ICD-10-CM

## 2023-11-10 NOTE — Progress Notes (Signed)
 Cardiology Office Note:  .   Date:  11/10/2023  ID:  Elizabeth [name], DOB 06-29-1951, MRN [id number] PCP: Elizabeth Rock HERO, MD  [facility] Providers Cardiologist:  Elizabeth Cave, MD Electrophysiologist:  Elizabeth ONEIDA HOLTS, MD    History of Present Illness: .   Elizabeth [name] is a 73 y.o. 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   Previously was hospitalized 04/2021 for TIA. Echocardiogram showed EF 60 to 65%, no RWMA, mild MR, negative bubble study. Carotid artery ultrasound showed a right greater than left carotid bifurcation atherosclerosis with less than 50% stenosis. Subsequent outpatient cardiac monitoring revealed sinus rhythm with first-degree AV block, second-degree AV block type I, 2 pauses with the longest lasting 3.4 seconds, rare atrial and ventricular ectopy with no evidence of atrial fibrillation/flutter. Admitted 06/28/23 repeat Echocardiogram revealed LVEF 55 to 60%, no RWMA, mild mitral valve regurgitation.  Carotid duplex revealed minimal to moderate amount of bilateral atherosclerotic plaque right greater than left similar to 04/2021 study.  MRI was positive for small cerebellar stroke, less likely the cause of her syncopal episode.  Neurology was consulted as well.  She was started on OAC with apixaban  for CHA2DS2-VASc score of at least 7.  MRI of the brain revealed near occlusion in the right distal P2, moderate stenosis of the proximal right P2.  Multifocal moderate stenosis in the anterior right M2 and mild  stenosis in the left supra clinoid ICA.  EEG was without concern for seizures.  Clopidogrel  was discontinued after she was continued on apixaban  to decrease the risk of bleeding.  She was evaluated by EP 08/24/2023 for atrial fibrillation with SVR and syncope.  ZIO monitor reviewed revealed 9% atrial fibrillation with ventricular rate of 28-86 and average 50 bpm.  She had 63 pauses with the longest lasting 3.7 seconds with rare ectopy.  In the long-term she would likely benefit from a sinus rhythm with cardioversion but before performing cardioversion she will likely require permanent pacemaker placement given her ventricular rates and history of syncope.  Unfortunately with her blood pressure weeping of fluid and wound she was not a candidate for pacemaker placement at the time.  She was sent for amyloid panel advised to follow-up with advanced heart failure urgently.She followed up with advanced heart failure 08/26/2023.  Due to being volume she was started on spironolactone  25 mg daily, continued on furosemide  40 mg daily and was ordered a PYP scan for the remainder of amyloid workup.  She was not a candidate for SGLT2 inhibitor due to recurrent UTIs.  No other medication changes were made and she was scheduled for follow-up after her scan was completed should return to clinic 09/13/2023 7 she been doing well.  Legs are continue to be swollen with weeping.  Weight was down approximately 15 pounds.  She did have follow-up with the EP and followed up with advanced heart failure who doubled up her diuretic medication.  She stated she did have scant emesis, but not at the results of that yet.  Medication regimen states the same and she was scheduled for follow-up BMP.  She  was encouraged to keep her appointments heart failure and EP.   She was hospitalized at [facility] on 10/02/2023 for partial seizure, AKI on CKD, and an abnormal UA.  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  He has been  approximately 30 years since she had had a seizure but she had recently been on Levaquin which lowered her seizure threshold.  EEG showed cortical dysfunction arising from the right hemisphere, maximal right frontal region likely secondary to underlying.  No seizure epileptiform discharges were seen.  Levaquin was discontinued and she was placed on doxycycline .  No further seizures were noted in the hospital and neurology following.  Lisinopril  and spironolactone  were placed on hold.  She was restarted on her Lasix  kidney function improved to baseline and she was subsequently considered stable for discharge on 10/10/2023 to SNF.  She was last seen in clinic on 10/25/2023 by advanced heart failure.  She had recently been discharged from SNF.  She was scheduled short breath with exertion but denied any PND orthopnea.  There was no recurrent seizure activity.  She was continued on midodrine , Lasix  was increased to 60 mg daily x 2 days then back to 40 mg daily, and she was continued on Tafamidis .  She has not labs that were scheduled.  Continued follow-up appointment with EP was scheduled as well.  She returns to clinic today accompanied by her husband.  She states that she is not feeling well today.  She just feels weak and tired.  She has had no further seizure activity.  Husband states that since she has been home from SNF that she has had a poor appetite and has not been eating well.  She denies any chest pain or shortness of breath but continues to have peripheral edema.  She was recently followed up with advanced heart failure clinic stating some of her medications were changed.  States that she has been compliant with her medications.  Denies any bleeding or blood noted in her urine or stool.  ROS: 10 point review of system has been reviewed and considered negative with exception what is been listed in the HPI  Studies Reviewed: [name]   EKG Interpretation Date/Time:  Thursday November 10 2023 13:33:30  EST Ventricular Rate:  60 PR Interval:    QRS Duration:  94 QT Interval:  426 QTC Calculation: 426 R Axis:   29  Text Interpretation: Atrial fibrillation Low voltage QRS Cannot rule out Anterior infarct , age undetermined When compared with ECG of 25-Oct-2023 10:40, Confirmed by Gerard Frederick ([id number]) on 11/10/2023 1:42:02 PM    Myocardial Amyloid 09/07/23   Myocardial uptake was positive for radiotracer uptake. The visual grade of myocardial uptake relative to the ribs was Grade 2 (Myocardial uptake equal to rib uptake).   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   Prior study not available for comparison.   Event Monitor (Zio) 08/18/23 Atrial Fibrillation occurred continuously (100% burden), ranging from 28-86 bpm (avg of 50 bpm). 63 Pauses occurred, the longest lasting 3.7 secs (16 bpm). Isolated VEs were rare (<1.0%), and no VE Couplets or VE Triplets were present. MD notification  criteria for Slow Atrial Fibrillation met - report posted prior to notification per account request ([place]).   Carotid Duplex 07/03/23 IMPRESSION: Minimal to moderate amount of bilateral atherosclerotic plaque, right-greater-than-left, morphologically similar to the 04/2021 examination and again not resulting in a hemodynamically significant stenosis within either internal carotid artery.   2D echo  07/03/2023: 1. Left ventricular ejection fraction, by estimation, is 55 to 60%. The  left ventricle has normal function. The left ventricle has no regional  wall motion abnormalities. Left ventricular diastolic parameters are  indeterminate.   2. Right ventricular systolic function is normal. The right ventricular  size is mildly enlarged.   3. The mitral valve is normal in structure. Mild mitral valve  regurgitation.   4. The aortic valve is tricuspid. Aortic valve regurgitation is not  visualized.   5. The inferior vena cava is normal in size with <50% respiratory  variability,  suggesting right atrial pressure of 8 mmHg.   Comparison(s): The left ventricular function is unchanged.    Zio patch 07/2021: Patient had a min HR of 43 bpm, max HR of 203 bpm, and avg HR of 65 bpm. Predominant underlying rhythm was Sinus Rhythm. First Degree AV Block was present. 2 Pauses occurred, the longest lasting 3.4 secs (17 bpm). Second Degree AV Block-Mobitz I  (Wenckebach) was present. Isolated SVEs were rare (<1.0%), SVE Couplets were rare (<1.0%), and SVE Triplets were rare (<1.0%). Isolated VEs were rare (<1.0%), VE Couplets were rare (<1.0%), and no VE Triplets were present. No evidence of atrial fibrillation or atrial flutter noted to explain symptoms of CVA. __________   Limited echo with bubble study 04/27/2021: 1. Left ventricular ejection fraction, by estimation, is 60 to 65%. The  left ventricle has normal function. The left ventricle has no regional  wall motion abnormalities. Left ventricular diastolic parameters were  normal.   2. Right ventricular systolic function is normal. The right ventricular  size is normal.   3. The mitral valve is normal in structure. Mild mitral valve  regurgitation.   4. The aortic valve is normal in structure. Aortic valve regurgitation is  not visualized.   IAS/Shunts: No atrial level shunt detected by color flow Doppler. Agitated  saline contrast was given intravenously to evaluate for intracardiac  shunting. There is no evidence of a patent foramen ovale. There is no  evidence of an atrial septal defect.  __________   Carotid artery ultrasound 04/27/2021: IMPRESSION: Right greater than left carotid bifurcation atherosclerosis with less than 50% stenosis. Risk Assessment/Calculations:    CHA2DS2-VASc Score = 7   This indicates a 11.2% annual risk of stroke. The patient's score is based upon: CHF History: 0 HTN History: 1 Diabetes History: 1 Stroke History: 2 Vascular Disease History: 1 Age Score: 1 Gender Score: 1             Physical Exam:   VS:  BP 110/73   Pulse 60   Ht 5' 3 (1.6 m)   Wt 170 lb 6.4 oz (77.3 kg)   SpO2 99%   BMI 30.19 kg/m    Wt Readings from Last 3 Encounters:  11/10/23 170 lb 6.4 oz (77.3 kg)  10/25/23 175 lb 9.6 oz (79.7 kg)  10/02/23 194 lb (88 kg)    GEN: Well nourished, well developed in no acute distress NECK: No JVD; No carotid bruits CARDIAC: IRIR, no murmurs, rubs, gallops RESPIRATORY:  Clear to auscultation without rales, wheezing or rhonchi  ABDOMEN: Soft, non-tender, non-distended EXTREMITIES:  2+ edema to BLE; No deformity   ASSESSMENT AND PLAN: .   Chronic HFpEF with LVEF 55 to 60%, no regional wall motion abnormalities with mild MR noted on echocardiogram 06/2023.  On recent hospitalization MRA was discontinued and she was not a candidate for SGLT2 inhibitors due to recurrent UTIs.  She was continued  on normal on Farxiga  10 mg daily, furosemide  40 mg daily.  Continues to be weight down.  And NYHA class III symptoms.  Encouraged to continue to limit her fluid intake, sodium intake and to weigh daily if possible.  She is also encouraged to continue with all appointments with advanced heart failure clinic.  Recent diagnoses of TTR amyloid.  Continued on Tafamidis  80 mg daily.  Her weight is down 5 pounds since last visit.  Repeat labs ordered today.  Paroxysmal atrial fibrillation noted in atrial fibrillation on EKG today with rates of 60.  She is continued on apixaban  5 mg twice daily for CHA2DS2-VASc of at least 7 stroke prophylaxis.  She is not on beta-blocker therapy at this time due to baseline bradycardia.  She has been encouraged to continue follow-up with the EP in January.  CKD stage IV.  Serum creatinine with a baseline of approximately 2.  She was sent for follow-up CMP today.  History of CVA with acute cerebellar infarct on MRI of the brain with previous admission with history of cerebrovascular disease.  She is on apixaban  and lieu of clopidogrel  to reduce  risk of bleeding.  She is also continued on statin therapy.  She also continues to be followed by neurology.  Hypertension with a history of hypotension blood pressure today is 110/73.  She is continued on midodrine  5 mg 3 times daily.   Type 2 diabetes which she is continued on Tradjenta  continues to be managed by PCP.  Peripheral vascular disease previously on clopidogrel  has been discontinued and she is now on apixaban .  This continues to be followed by [facility].  Longstanding history of lymphedema with 2+ pitting edema to bilateral lower extremities patient needs 6 chronic and unchanged from her chronic venous insufficiency.  She is continued on furosemide  40 mg daily.       Dispo: Patient return to clinic to see MD/APP in 3 months or sooner if needed for reevaluation of symptoms.  Signed, Bookert Guzzi, NP

## 2023-11-10 NOTE — Patient Instructions (Signed)
 Medication Instructions:  - No changes *If you need a refill on your cardiac medications before your next appointment, please call your pharmacy*  Lab Work: Your provider would like for you to have following labs drawn today CBC, CMET, & magnesium .   If you have labs (blood work) drawn today and your tests are completely normal, you will receive your results only by: MyChart Message (if you have MyChart) OR A paper copy in the mail If you have any lab test that is abnormal or we need to change your treatment, we will call you to review the results.  Testing/Procedures: - None ordered  Follow-Up: At [facility], you and your health needs are our priority.  As part of our continuing mission to provide you with exceptional heart care, we have created designated Provider Care Teams.  These Care Teams include your primary Cardiologist (physician) and Advanced Practice Providers (APPs -  Physician Assistants and Nurse Practitioners) who all work together to provide you with the care you need, when you need it.  Your next appointment:   3 month(s)  Provider:   Tylene Lunch, NP

## 2023-11-11 LAB — CBC
Hematocrit: 49.5 % — ABNORMAL HIGH (ref 34.0–46.6)
Hemoglobin: 15.9 g/dL (ref 11.1–15.9)
MCH: 29.1 pg (ref 26.6–33.0)
MCHC: 32.1 g/dL (ref 31.5–35.7)
MCV: 91 fL (ref 79–97)
Platelets: 237 10*3/uL (ref 150–450)
RBC: 5.47 x10E6/uL — ABNORMAL HIGH (ref 3.77–5.28)
RDW: 13.1 % (ref 11.7–15.4)
WBC: 9.3 10*3/uL (ref 3.4–10.8)

## 2023-11-11 LAB — COMPREHENSIVE METABOLIC PANEL
ALT: 10 [IU]/L (ref 0–32)
AST: 17 [IU]/L (ref 0–40)
Albumin: 3.9 g/dL (ref 3.8–4.8)
Alkaline Phosphatase: 119 [IU]/L (ref 44–121)
BUN/Creatinine Ratio: 13 (ref 12–28)
BUN: 24 mg/dL (ref 8–27)
Bilirubin Total: 0.6 mg/dL (ref 0.0–1.2)
CO2: 26 mmol/L (ref 20–29)
Calcium: 9.8 mg/dL (ref 8.7–10.3)
Chloride: 94 mmol/L — ABNORMAL LOW (ref 96–106)
Creatinine, Ser: 1.83 mg/dL — ABNORMAL HIGH (ref 0.57–1.00)
Globulin, Total: 2.6 g/dL (ref 1.5–4.5)
Glucose: 201 mg/dL — ABNORMAL HIGH (ref 70–99)
Potassium: 4.1 mmol/L (ref 3.5–5.2)
Sodium: 137 mmol/L (ref 134–144)
Total Protein: 6.5 g/dL (ref 6.0–8.5)
eGFR: 29 mL/min/{1.73_m2} — ABNORMAL LOW (ref >59)

## 2023-11-11 LAB — MAGNESIUM: Magnesium: 2.2 mg/dL (ref 1.6–2.3)

## 2023-11-11 NOTE — Progress Notes (Signed)
 Kidney function is improving. Potassium and Magnesium are stable. Blood counts are stable. Continue current medication regimen without changes at this time.

## 2023-11-16 ENCOUNTER — Other Ambulatory Visit: Payer: Self-pay

## 2023-11-16 NOTE — Progress Notes (Signed)
 Specialty Pharmacy Refill Coordination Note  Elizabeth [name] is a 73 y.o. female contacted today regarding refills of specialty medication(s) Tafamidis  Meglumine  (Cardiac)   Patient requested Delivery   Delivery date: 11/18/23   Verified address: [address]   [place] [address]   Medication will be filled on 11/17/23.

## 2023-11-17 ENCOUNTER — Other Ambulatory Visit: Payer: Self-pay

## 2023-11-24 ENCOUNTER — Ambulatory Visit: Payer: Medicare PPO | Admitting: Cardiology

## 2023-11-25 ENCOUNTER — Ambulatory Visit: Payer: Medicare PPO | Admitting: Cardiology

## 2023-12-02 ENCOUNTER — Other Ambulatory Visit (HOSPITAL_COMMUNITY): Payer: Self-pay

## 2023-12-05 ENCOUNTER — Other Ambulatory Visit: Payer: Self-pay

## 2023-12-05 MED ORDER — MIDODRINE HCL 5 MG PO TABS: 5.0000 mg | ORAL_TABLET | Freq: Three times a day (TID) | ORAL | 1 refills | Status: DC

## 2023-12-06 ENCOUNTER — Other Ambulatory Visit: Payer: Self-pay

## 2023-12-06 NOTE — Progress Notes (Signed)
Specialty Pharmacy Refill Coordination Note  Elizabeth [name] is a 73 y.o. female contacted today regarding refills of specialty medication(s) Tafamidis Meglumine (Cardiac)   Patient requested Delivery   Delivery date: 12/13/23   Verified address: [address]   [name] [id number]   Medication will be filled on 12/12/23.

## 2023-12-23 ENCOUNTER — Telehealth: Payer: Self-pay | Admitting: Cardiology

## 2023-12-23 NOTE — Telephone Encounter (Signed)
Pt confirmed appt for 12/26/23

## 2023-12-26 ENCOUNTER — Ambulatory Visit: Payer: Medicare HMO | Attending: Cardiology | Admitting: Cardiology

## 2023-12-26 ENCOUNTER — Encounter: Payer: Self-pay | Admitting: Cardiology

## 2023-12-26 VITALS — BP 140/67 | HR 63 | Wt 196.0 lb

## 2023-12-26 DIAGNOSIS — I5032 Chronic diastolic (congestive) heart failure: Secondary | ICD-10-CM

## 2023-12-26 DIAGNOSIS — I480 Paroxysmal atrial fibrillation: Secondary | ICD-10-CM

## 2023-12-26 DIAGNOSIS — N184 Chronic kidney disease, stage 4 (severe): Secondary | ICD-10-CM

## 2023-12-26 DIAGNOSIS — E854 Organ-limited amyloidosis: Secondary | ICD-10-CM

## 2023-12-26 DIAGNOSIS — I43 Cardiomyopathy in diseases classified elsewhere: Secondary | ICD-10-CM

## 2023-12-26 MED ORDER — FUROSEMIDE 40 MG PO TABS: 40.0000 mg | ORAL_TABLET | Freq: Two times a day (BID) | ORAL | 11 refills | Status: DC

## 2023-12-26 NOTE — Progress Notes (Signed)
ADVANCED HEART FAILURE FOLLOW UP CLINIC NOTE  Referring Physician: Leanna Sato, MD  Primary Care: Leanna Sato, MD Primary Cardiologist:  HPI: Elizabeth [name] is a 73 y.o. 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          First seen by cardiology in 07/2021 after having been admitted to the hospital in 04/2021 for TIA. Echo during that admission showed an EF of 60 to 65%, no regional wall motion abnormalities, normal LV diastolic function parameters, normal RV systolic function and ventricular cavity size, mild mitral regurgitation, and negative bubble study. Carotid artery ultrasound showed right greater than left carotid bifurcation atherosclerosis with less than 50% stenosis. Subsequent outpatient cardiac monitoring showed a predominant rhythm of sinus with first-degree AV block, second-degree AV block type I, 2 pauses with the longest being 3.4 seconds, rare atrial and ventricular ectopy, and no evidence of A-fib or flutter.   She was recently admitted in 06/2023 for a syncopal episode, was found to be in slow atrial fibrillation. No clear etiology for her stroke was identified. She was seen by EP with future plan for cardioversion and PPM placement, but she was found to be in heart failure so was referred to HF clinic. She was markedly volume overloaded but improving in clinic. She underwent a PYP scan that was read as grade 2.      SUBJECTIVE:  Reports 15 to 20 pound weight gain since her last clinic visit as well as worsening swelling of her lower extremities.  She denies any worsening shortness of breath and has felt improved from a physical standpoint since leaving her SNF.  She has been taking her medications as prescribed, with the exception of the midodrine which she has been off for some time.  Will not restart.  PMH, current medications, allergies, social history, and family history reviewed in  epic.  PHYSICAL EXAM: Vitals:   12/26/23 1036  BP: (!) 140/67  Pulse: 63  SpO2: 99%   GENERAL: Well nourished and in no apparent distress at rest.  PULM:  Normal work of breathing, clear to auscultation bilaterally. Respirations are unlabored.  CARDIAC:  JVP: Moderately elevated         Normal rate with regular rhythm. No murmurs, rubs or gallops.  3+ nonpitting lower extremity edema . Warm and well perfused extremities. ABDOMEN: Soft, non-tender, non-distended. NEUROLOGIC: Patient is oriented x3 with no focal or lateralizing neurologic deficits.    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  Improved significantly, but now 20 pounds up with worsening lower extremity edema on exam.  Will increase Lasix dose to 40 mg twice daily with close follow-up.  Likely secondary to cardiac amyloid. - Continue Farxiga 10 mg daily - Increase furosemide to 40mg  BID - Continue tafamdis - TTR genetics panel pending - Stop midodrine - Compression stockings -1 month follow-up   Atrial fibrillation: Slow rate, holding off on pacemaker given lower extremity exam and recent decompensation episodes.  - Continue apixaban, patient is compliant  CKD: Stage IIIb, stable on last labs.  - Repeat at next visit  Follow up in 1 month  Clearnce Hasten, MD Advanced Heart Failure Mechanical Circulatory Support 12/26/23

## 2023-12-26 NOTE — Patient Instructions (Signed)
 INCREASE LASIX TO 40 MG TWICE DAILY

## 2024-01-05 ENCOUNTER — Other Ambulatory Visit (HOSPITAL_COMMUNITY): Payer: Self-pay

## 2024-01-06 ENCOUNTER — Other Ambulatory Visit (HOSPITAL_COMMUNITY): Payer: Self-pay

## 2024-01-12 ENCOUNTER — Other Ambulatory Visit: Payer: Self-pay

## 2024-01-23 ENCOUNTER — Telehealth: Payer: Self-pay | Admitting: Cardiology

## 2024-01-23 NOTE — Telephone Encounter (Signed)
 Lvm to confirm appt on 01/24/24

## 2024-01-24 ENCOUNTER — Other Ambulatory Visit (HOSPITAL_COMMUNITY): Payer: Self-pay

## 2024-01-24 ENCOUNTER — Telehealth: Payer: Self-pay

## 2024-01-24 ENCOUNTER — Other Ambulatory Visit: Payer: Self-pay

## 2024-01-24 ENCOUNTER — Ambulatory Visit: Payer: Medicare HMO | Attending: Cardiology | Admitting: Cardiology

## 2024-01-24 VITALS — BP 130/59 | HR 60 | Wt 184.2 lb

## 2024-01-24 DIAGNOSIS — I5032 Chronic diastolic (congestive) heart failure: Secondary | ICD-10-CM | POA: Diagnosis not present

## 2024-01-24 NOTE — Patient Instructions (Signed)
  Lab Work:  Go DOWN to LOWER LEVEL (LL) to have your blood work completed inside of [employer] office.  We will only call you if the results are abnormal or if the provider would like to make medication changes.   Testing/Procedures:  Genetic testing has been collected, this has to be sent to [facility] for processing and can take 1-2 weeks for [place] to get results back.  We will let you know the results once reviewed by your provider.   Follow-Up in: Please follow up with the Advanced Heart Failure Clinic with Dr. Elwyn Lade in 3 months.  At the Advanced Heart Failure Clinic, you and your health needs are our priority. We have a designated team specialized in the treatment of Heart Failure. This Care Team includes your primary Heart Failure Specialized Cardiologist (physician), Advanced Practice Providers (APPs- Physician Assistants and Nurse Practitioners), and Pharmacist who all work together to provide you with the care you need, when you need it.   You may see any of the following providers on your designated Care Team at your next follow up:  Dr. Arvilla Meres Dr. Marca Ancona Dr. Dorthula Nettles Dr. Theresia Bough Clarisa Kindred, FNP Enos Fling, RPH-CPP  Please be sure to bring in all your medications bottles to every appointment.   Need to Contact us:  If you have any questions or concerns before your next appointment please send [place] a message through [place] or call our office at [phone].    TO LEAVE A MESSAGE FOR THE NURSE SELECT OPTION 2, PLEASE LEAVE A MESSAGE INCLUDING: YOUR NAME DATE OF BIRTH CALL BACK NUMBER REASON FOR CALL**this is important as we prioritize the call backs  YOU WILL RECEIVE A CALL BACK THE SAME DAY AS LONG AS YOU CALL BEFORE 4:00 PM

## 2024-01-24 NOTE — Progress Notes (Unsigned)
 TTR genetic testing collected via [facility] per Dr Elwyn Lade.  Order form completed, signed and shipped with sample by Fed[employer] to [facility].

## 2024-01-24 NOTE — Telephone Encounter (Signed)
 Advanced Heart Failure Patient Advocate Encounter  Patient contacted office with concerns about Tafamadis refill. Pt stated pharmacy usually contacts her and she has not heard from pharmacy yet.   Spoke with [facility], there were attempted calls and MyChart messages to the pt at the end of February that went unanswered and unreturned. [facility] will try to reach out to patient again.  I have also left a voicemail for the patient including the phone number for Specialty Pharmacy so that the patient may initiate the refill if needed.  Burnell Blanks, CPhT Rx Patient Advocate Phone: [phone]

## 2024-01-25 LAB — BASIC METABOLIC PANEL
BUN/Creatinine Ratio: 21 (ref 12–28)
BUN: 34 mg/dL — ABNORMAL HIGH (ref 8–27)
CO2: 29 mmol/L (ref 20–29)
Calcium: 9.3 mg/dL (ref 8.7–10.3)
Chloride: 92 mmol/L — ABNORMAL LOW (ref 96–106)
Creatinine, Ser: 1.59 mg/dL — ABNORMAL HIGH (ref 0.57–1.00)
Glucose: 130 mg/dL — ABNORMAL HIGH (ref 70–99)
Potassium: 3.7 mmol/L (ref 3.5–5.2)
Sodium: 140 mmol/L (ref 134–144)
eGFR: 34 mL/min/{1.73_m2} — ABNORMAL LOW (ref >59)

## 2024-01-26 ENCOUNTER — Other Ambulatory Visit: Payer: Self-pay

## 2024-01-26 ENCOUNTER — Telehealth: Payer: Self-pay | Admitting: Cardiology

## 2024-01-26 MED ORDER — TAFAMIDIS MEGLUMINE (CARDIAC) 20 MG PO CAPS: 80.0000 mg | ORAL_CAPSULE | Freq: Every day | ORAL | 11 refills | Status: DC

## 2024-01-26 MED ORDER — POTASSIUM CHLORIDE ER 10 MEQ PO TBCR: 20.0000 meq | EXTENDED_RELEASE_TABLET | Freq: Every day | ORAL | 3 refills | Status: DC

## 2024-01-26 NOTE — Telephone Encounter (Signed)
 Medications sent to mail order pharmacy per patient request.

## 2024-01-27 ENCOUNTER — Encounter: Payer: Self-pay | Admitting: Cardiology

## 2024-01-27 NOTE — Progress Notes (Signed)
   ADVANCED HEART FAILURE FOLLOW UP CLINIC NOTE  Referring Physician: Leanna Sato, MD  Primary Care: Leanna Sato, MD Primary Cardiologist:  HPI: Elizabeth [name] is a 73 y.o. 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          First seen by cardiology in 07/2021 after having been admitted to the hospital in 04/2021 for TIA. Echo during that admission showed an EF of 60 to 65%, no regional wall motion abnormalities, normal LV diastolic function parameters, normal RV systolic function and ventricular cavity size, mild mitral regurgitation, and negative bubble study. Carotid artery ultrasound showed right greater than left carotid bifurcation atherosclerosis with less than 50% stenosis. Subsequent outpatient cardiac monitoring showed a predominant rhythm of sinus with first-degree AV block, second-degree AV block type I, 2 pauses with the longest being 3.4 seconds, rare atrial and ventricular ectopy, and no evidence of A-fib or flutter.   She was recently admitted in 06/2023 for a syncopal episode, was found to be in slow atrial fibrillation. No clear etiology for her stroke was identified. She was seen by EP with future plan for cardioversion and PPM placement, but she was found to be in heart failure so was referred to HF clinic. She was markedly volume overloaded but improving in clinic. She underwent a PYP scan that was read as grade 2.      SUBJECTIVE:  Weight has improved since her last visit, she continues to complain of chronic shortness of breath.  Blood pressure has remained stable.  Denies any dizziness upon standing.  She has been taking her medications as prescribed, has not had any issues.  Discussed that it appears that her genetic testing was not sent to the lab so we will repeat today.  PMH, current medications, allergies, social history, and family history reviewed in epic.  PHYSICAL EXAM: Vitals:   01/24/24  1028  BP: (!) 130/59  Pulse: 60  SpO2: 100%   GENERAL: Chronically ill-appearing PULM:  Normal work of breathing, clear to auscultation bilaterally. Respirations are unlabored.  CARDIAC:  JVP: Moderately elevated        Bradycardic with a regular rhythm no murmurs, rubs or gallops.  2+ nonpitting lower extremity edema . Warm and well perfused extremities. ABDOMEN: Soft, non-tender, non-distended. NEUROLOGIC: Patient is oriented x3 with no focal or lateralizing neurologic deficits.    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  Still with chronic shortness of breath that is only mildly improved from prior.  Continue current Lasix dosing.  Still with no symptoms of neuropathy. - Continue Farxiga 10 mg daily -Continue furosemide to 40mg  BID - Continue tafamdis - TTR genetics panel sent - Stop midodrine - Compression stockings - 35-month follow-up -No SGLT2 with recent UTIs, spironolactone with previous hyperkalemia and CKD   Atrial fibrillation: Slow rate, follow-up with the EP arranged.  Lower extremity wounds have significantly improved. - Continue apixaban, patient is compliant  CKD: Stage IIIb, stable on last labs.  -Repeat ordered today  Follow up in 3 months  Clearnce Hasten, MD Advanced Heart Failure Mechanical Circulatory Support 01/27/24

## 2024-01-28 ENCOUNTER — Other Ambulatory Visit (HOSPITAL_COMMUNITY): Payer: Self-pay | Admitting: Cardiology

## 2024-01-28 ENCOUNTER — Other Ambulatory Visit (HOSPITAL_COMMUNITY): Payer: Self-pay

## 2024-01-31 ENCOUNTER — Other Ambulatory Visit (HOSPITAL_COMMUNITY): Payer: Self-pay

## 2024-02-08 ENCOUNTER — Encounter: Payer: Self-pay | Admitting: Cardiology

## 2024-02-08 ENCOUNTER — Ambulatory Visit: Payer: Medicare PPO | Attending: Cardiology | Admitting: Cardiology

## 2024-02-08 VITALS — BP 112/87 | Ht 63.0 in | Wt 187.8 lb

## 2024-02-08 DIAGNOSIS — I872 Venous insufficiency (chronic) (peripheral): Secondary | ICD-10-CM

## 2024-02-08 DIAGNOSIS — E854 Organ-limited amyloidosis: Secondary | ICD-10-CM | POA: Diagnosis not present

## 2024-02-08 DIAGNOSIS — I480 Paroxysmal atrial fibrillation: Secondary | ICD-10-CM | POA: Diagnosis not present

## 2024-02-08 DIAGNOSIS — N184 Chronic kidney disease, stage 4 (severe): Secondary | ICD-10-CM | POA: Diagnosis not present

## 2024-02-08 DIAGNOSIS — I639 Cerebral infarction, unspecified: Secondary | ICD-10-CM

## 2024-02-08 DIAGNOSIS — I739 Peripheral vascular disease, unspecified: Secondary | ICD-10-CM

## 2024-02-08 DIAGNOSIS — E1151 Type 2 diabetes mellitus with diabetic peripheral angiopathy without gangrene: Secondary | ICD-10-CM

## 2024-02-08 DIAGNOSIS — I10 Essential (primary) hypertension: Secondary | ICD-10-CM

## 2024-02-08 DIAGNOSIS — I5032 Chronic diastolic (congestive) heart failure: Secondary | ICD-10-CM | POA: Diagnosis not present

## 2024-02-08 DIAGNOSIS — I890 Lymphedema, not elsewhere classified: Secondary | ICD-10-CM

## 2024-02-08 DIAGNOSIS — I43 Cardiomyopathy in diseases classified elsewhere: Secondary | ICD-10-CM

## 2024-02-08 NOTE — Progress Notes (Signed)
 Cardiology Office Note:  .   Date:  02/08/2024  ID:  Elizabeth [name], Elizabeth [name], MRN [id number] PCP: Elizabeth Sato, MD  [facility] Providers Cardiologist:  Debbe Odea, MD Electrophysiologist:  Lanier Prude, MD    History of Present Illness: .   [name] is a 73 y.o. 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   Previously was hospitalized 04/2021 for TIA. Echocardiogram showed EF 60 to 65%, no RWMA, mild MR, negative bubble study. Carotid artery ultrasound showed a right greater than left carotid bifurcation atherosclerosis with less than 50% stenosis. Subsequent outpatient cardiac monitoring revealed sinus rhythm with first-degree AV block, second-degree AV block type I, 2 pauses with the longest lasting 3.4 seconds, rare atrial and ventricular ectopy with no evidence of atrial fibrillation/flutter.   Admitted 06/28/23 repeat Echocardiogram revealed LVEF 55 to 60%, no RWMA, mild mitral valve regurgitation.  Carotid duplex revealed minimal to moderate amount of bilateral atherosclerotic plaque right greater than left similar to 04/2021 study.  MRI was positive for small cerebellar stroke, less likely the cause of her syncopal episode.  Neurology was consulted as well.  She was started on [facility] with apixaban for CHA2DS2-VASc score of at least 7.  MRI of the brain revealed near occlusion in the right distal P2, moderate stenosis of the proximal right P2.  Multifocal moderate stenosis in the anterior right  M2 and mild stenosis in the left supra clinoid ICA.  EEG was without concern for seizures.  Clopidogrel was discontinued after she was continued on apixaban to decrease the risk of bleeding.    She was evaluated by EP 08/24/2023 for atrial fibrillation with SVR and syncope.  ZIO monitor reviewed revealed 9% atrial fibrillation with ventricular rate of 28-86 and average 50 bpm.  She had 63 pauses with the longest lasting 3.7 seconds with rare ectopy.  In the long-term she would likely benefit from a sinus rhythm with cardioversion but before performing cardioversion she will likely require permanent pacemaker placement given her ventricular rates and history of syncope.  Unfortunately with her blood pressure weeping of fluid and wound she was not a candidate for pacemaker placement at the time.  She was sent for amyloid panel advised to follow-up with advanced heart failure urgently.  She followed up with advanced heart failure 08/26/2023.  Due to being volume she was started on spironolactone 25 mg daily, continued on furosemide 40 mg daily and was ordered a PYP scan for the remainder of amyloid workup.  She was not a candidate for SGLT2 inhibitor due to recurrent UTIs.  No other medication changes were made and she was scheduled for follow-up after her scan was completed.    Returned to clinic 09/13/2023 she been doing well.  Legs are continue to be swollen with weeping.  Weight was down approximately 15 pounds.  She did have follow-up with the EP and followed up with advanced heart failure who doubled up her diuretic medication.  She stated she did have scant emesis, but not at the results of that yet.  Medication regimen states the same and she  was scheduled for follow-up BMP.  She was encouraged to keep her appointments heart failure and EP.  She was hospitalized at [facility] on 10/02/2023 for partial seizure, AKI on CKD, and an abnormal UA.  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   He has been approximately 30 years since she had had a seizure but she had recently been on Levaquin which lowered her seizure threshold.  EEG showed cortical dysfunction arising from the right hemisphere, maximal right frontal region likely secondary to underlying.  No seizure epileptiform discharges were seen.  Levaquin was discontinued and she was placed on doxycycline.  No further seizures were noted in the hospital and neurology following.  Lisinopril and spironolactone were placed on hold.  She was restarted on her Lasix kidney function improved to baseline and she was subsequently considered stable for discharge on 10/10/2023 to SNF.    She was last seen in clinic 11/10/2023 accompanied by her husband.  States she was doing well other than feeling weak and tired.  She had no further seizure activity and was home from SNF.  Biggest concerns were poor appetite.  She did continue to follow with advanced heart failure clinic.  She was scheduled for repeat labs.  There were no medication changes that were made and no further testing that was ordered at the time.  She was last seen by advanced heart failure on 01/24/2024 following up for concern of cardiac amyloid.  TTR genetics panel was sent.  Midodrine was stopped.  She continues to remain in atrial fibrillation with a slower rate and follow-up with the EP was arranged.  Her lower extremity wounds had significantly improved.  She had continued to be compliant with her current medication regimen.  She returns to clinic today accompanied by her husband. She has recently followed with AHF and had her lasix increased to 40 mg twice daily. She continues to have swelling to her BLE but the skin itself has healed without opens areas or weeping. She denies any chest pain, worsening shortness of breath, or palpitations.  She is positive for nonproductive cough that started this morning and she thinks that she may have had a fever this morning as she was having some  chills and took Tylenol but did not take her temperature.  She is also complained of left leg pain over the past couple days and is concerned she may be coming down with the flu having muscle aches and discomfort.  She states that she has been compliant with her medication regimen has not missed any doses of her apixaban.  She denies any bleeding or any blood noted in her stool or urine.  She also denies any recent hospitalizations or visits to the emergency department.  ROS: 10 point review of system has been reviewed and considered negative except ones been listed in the HPI  Studies Reviewed: [name]   EKG Interpretation Date/Time:  Wednesday February 08 2024 13:34:00 EDT Ventricular Rate:  59 PR Interval:    QRS Duration:  96 QT Interval:  436 QTC Calculation: 431 R Axis:   -75  Text Interpretation: Atrial fibrillation with slow ventricular response with premature ventricular or aberrantly conducted complexes Low voltage QRS Left anterior fascicular block Nonspecific ST and T wave abnormality When compared with ECG of 10-Nov-2023 13:33, Left anterior fascicular block is now Present large amount of artifact Confirmed by Charlsie Quest ([id number]) on 02/08/2024 1:36:08 PM    Myocardial Amyloid 09/07/23   Myocardial uptake was positive  for radiotracer uptake. The visual grade of myocardial uptake relative to the ribs was Grade 2 (Myocardial uptake equal to rib uptake).   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   Prior study not available for comparison.   Event Monitor (Zio) 08/18/23 Atrial Fibrillation occurred continuously (100% burden), ranging from 28-86 bpm (avg of 50 bpm). 63 Pauses occurred, the longest lasting 3.7 secs (16 bpm). Isolated VEs were rare (<1.0%), and no VE Couplets or VE Triplets were present. MD notification  criteria for Slow Atrial Fibrillation met - report posted prior to notification per account request ([place]).   Carotid Duplex  07/03/23 IMPRESSION: Minimal to moderate amount of bilateral atherosclerotic plaque, right-greater-than-left, morphologically similar to the 04/2021 examination and again not resulting in a hemodynamically significant stenosis within either internal carotid artery.   2D echo 07/03/2023: 1. Left ventricular ejection fraction, by estimation, is 55 to 60%. The  left ventricle has normal function. The left ventricle has no regional  wall motion abnormalities. Left ventricular diastolic parameters are  indeterminate.   2. Right ventricular systolic function is normal. The right ventricular  size is mildly enlarged.   3. The mitral valve is normal in structure. Mild mitral valve  regurgitation.   4. The aortic valve is tricuspid. Aortic valve regurgitation is not  visualized.   5. The inferior vena cava is normal in size with <50% respiratory  variability, suggesting right atrial pressure of 8 mmHg.   Comparison(s): The left ventricular function is unchanged.    Zio patch 07/2021: Patient had a min HR of 43 bpm, max HR of 203 bpm, and avg HR of 65 bpm. Predominant underlying rhythm was Sinus Rhythm. First Degree AV Block was present. 2 Pauses occurred, the longest lasting 3.4 secs (17 bpm). Second Degree AV Block-Mobitz I  (Wenckebach) was present. Isolated SVEs were rare (<1.0%), SVE Couplets were rare (<1.0%), and SVE Triplets were rare (<1.0%). Isolated VEs were rare (<1.0%), VE Couplets were rare (<1.0%), and no VE Triplets were present. No evidence of atrial fibrillation or atrial flutter noted to explain symptoms of CVA.   Limited echo with bubble study 04/27/2021: 1. Left ventricular ejection fraction, by estimation, is 60 to 65%. The  left ventricle has normal function. The left ventricle has no regional  wall motion abnormalities. Left ventricular diastolic parameters were  normal.   2. Right ventricular systolic function is normal. The right ventricular  size is normal.   3. The  mitral valve is normal in structure. Mild mitral valve  regurgitation.   4. The aortic valve is normal in structure. Aortic valve regurgitation is  not visualized.   IAS/Shunts: No atrial level shunt detected by color flow Doppler. Agitated  saline contrast was given intravenously to evaluate for intracardiac  shunting. There is no evidence of a patent foramen ovale. There is no  evidence of an atrial septal defect.    Carotid artery ultrasound 04/27/2021: IMPRESSION: Right greater than left carotid bifurcation atherosclerosis with less than 50% stenosis. Risk Assessment/Calculations:    CHA2DS2-VASc Score = 7   This indicates a 11.2% annual risk of stroke. The patient's score is based upon: CHF History: 0 HTN History: 1 Diabetes History: 1 Stroke History: 2 Vascular Disease History: 1 Age Score: 1 Gender Score: 1            Physical Exam:   VS:  BP 112/87   Ht 5\' 3"  (1.6 m)   Wt 187 lb 12.8 oz (85.2  kg)   SpO2 99%   BMI 33.27 kg/m    Wt Readings from Last 3 Encounters:  02/08/24 187 lb 12.8 oz (85.2 kg)  01/24/24 184 lb 4 oz (83.6 kg)  12/26/23 196 lb (88.9 kg)    GEN: Well nourished, well developed in no acute distress NECK: No JVD; No carotid bruits CARDIAC: IR IR, no murmurs, rubs, gallops RESPIRATORY:  Clear to auscultation without rales, wheezing or rhonchi  ABDOMEN: Soft, non-tender, non-distended EXTREMITIES:  2+ edema to BLE; No deformity   ASSESSMENT AND PLAN: .   Chronic HFpEF with an LVEF 55 to 60%, no RWMA, mild MR noted on echocardiogram 06/2023.  NYHA class II-III symptoms.  She is continued on furosemide and 40 mg twice daily, Farxiga 10 mg daily,  Tafamidis 80 mg daily (recent diagnosis of TTR amyloid).  With furosemide being recently increased that she has been sent for follow-up BMP today to reevaluate electrolytes and kidney function.  Encouraged to continue to follow with advanced heart failure clinic.  Paroxysmal atrial fibrillation which she is  noted to be in rate controlled atrial fibrillation today with a rate of 59 with PVCs versus aberrantly conducted beats.  She is continued on apixaban 5 mg twice daily for CHA2DS2-VASc score of at least 7 for stroke prophylaxis.  She is not on beta-blocker at this time due to baseline bradycardia.  Has been sent for CBC today to ensure hemoglobin has remained stable. she has been encouraged to continue to follow with EP.  CKD stage IV with last creatinine 1.59.  On the same day of the 01/24/2024 her furosemide was increased to 40 mg twice daily.  She has been sent for updated labs today to reevaluate kidney function.  History of CVA with acute cerebellar infarct on MRI of the brain with previous admission and history of cerebrovascular disease.  She is continued on apixaban and lieu of clopidogrel to reduce the risk of bleeding.  She is also continued on Pravachol 40 mg daily.  She continues to be followed by neurology.  Primary hypertension pressure today is noted to be 112/87.  Blood pressure remained stable.  She is continued on furosemide 40 mg twice daily.  She has been encouraged to continue to monitor her blood pressure 1 to 2 hours postmedication administration.  Recently was discontinued off of midodrine at her advanced heart failure follow-up.  Type 2 diabetes which she is continued on Farxiga.  Previously was on Tradjenta.  Not currently noted on medication list today.  This continues to be ongoing management by PCP  Peripheral vascular disease on apixaban and lieu of clopidogrel.  This continues to be followed by [facility].  Patient states today that she continues to have discomfort down her left leg but has upcoming follow-up.  Lymphedema that is chronic with 2+ pitting edema to the bilateral lower extremities.  She has no further open areas or weeping.  Continued on furosemide 40 mg twice daily.       Dispo: Patient returns clinic with MD/APP in 3 to 4 months or sooner if needed for reevaluation  of symptoms.  Signed, Dodger Sinning, NP

## 2024-02-08 NOTE — Patient Instructions (Signed)
 Medication Instructions:  Your physician recommends that you continue on your current medications as directed. Please refer to the Current Medication list given to you today.  *If you need a refill on your cardiac medications before your next appointment, please call your pharmacy*  Lab Work: Your provider would like for you to have following labs drawn today CBC,BMP.   If you have labs (blood work) drawn today and your tests are completely normal, you will receive your results only by: MyChart Message (if you have MyChart) OR A paper copy in the mail If you have any lab test that is abnormal or we need to change your treatment, we will call you to review the results.  Testing/Procedures: No test ordered today   Follow-Up: At [facility], you and your health needs are our priority.  As part of our continuing mission to provide you with exceptional heart care, our providers are all part of one team.  This team includes your primary Cardiologist (physician) and Advanced Practice Providers or APPs (Physician Assistants and Nurse Practitioners) who all work together to provide you with the care you need, when you need it.  Your next appointment:   3-4  month(s)  Provider:   You may see Debbe Odea, MD or one of the following Advanced Practice Providers on your designated Care Team:   Nicolasa Ducking, NP Ames Dura, PA-C Eula Listen, PA-C Cadence Simpson, PA-C Charlsie Quest, NP Carlos Levering, NP

## 2024-02-09 ENCOUNTER — Observation Stay

## 2024-02-09 ENCOUNTER — Other Ambulatory Visit: Payer: Self-pay

## 2024-02-09 ENCOUNTER — Telehealth: Payer: Self-pay | Admitting: Internal Medicine

## 2024-02-09 ENCOUNTER — Inpatient Hospital Stay
Admission: EM | Admit: 2024-02-09 | Discharge: 2024-02-13 | DRG: 871 | Disposition: A | Discharge status: Home Health | Attending: Internal Medicine | Admitting: Internal Medicine

## 2024-02-09 ENCOUNTER — Encounter: Payer: Self-pay | Admitting: Emergency Medicine

## 2024-02-09 ENCOUNTER — Telehealth: Payer: Self-pay | Admitting: *Deleted

## 2024-02-09 ENCOUNTER — Emergency Department

## 2024-02-09 DIAGNOSIS — N1832 Chronic kidney disease, stage 3b: Secondary | ICD-10-CM | POA: Diagnosis present

## 2024-02-09 DIAGNOSIS — E1140 Type 2 diabetes mellitus with diabetic neuropathy, unspecified: Secondary | ICD-10-CM | POA: Diagnosis present

## 2024-02-09 DIAGNOSIS — L0390 Cellulitis, unspecified: Secondary | ICD-10-CM | POA: Diagnosis present

## 2024-02-09 DIAGNOSIS — Z7902 Long term (current) use of antithrombotics/antiplatelets: Secondary | ICD-10-CM

## 2024-02-09 DIAGNOSIS — Z833 Family history of diabetes mellitus: Secondary | ICD-10-CM

## 2024-02-09 DIAGNOSIS — Z853 Personal history of malignant neoplasm of breast: Secondary | ICD-10-CM

## 2024-02-09 DIAGNOSIS — A0811 Acute gastroenteropathy due to Norwalk agent: Secondary | ICD-10-CM | POA: Diagnosis present

## 2024-02-09 DIAGNOSIS — E854 Organ-limited amyloidosis: Secondary | ICD-10-CM | POA: Diagnosis present

## 2024-02-09 DIAGNOSIS — Z7984 Long term (current) use of oral hypoglycemic drugs: Secondary | ICD-10-CM

## 2024-02-09 DIAGNOSIS — Z79899 Other long term (current) drug therapy: Secondary | ICD-10-CM

## 2024-02-09 DIAGNOSIS — I43 Cardiomyopathy in diseases classified elsewhere: Secondary | ICD-10-CM | POA: Diagnosis present

## 2024-02-09 DIAGNOSIS — E876 Hypokalemia: Secondary | ICD-10-CM | POA: Diagnosis not present

## 2024-02-09 DIAGNOSIS — Z881 Allergy status to other antibiotic agents status: Secondary | ICD-10-CM

## 2024-02-09 DIAGNOSIS — T501X5A Adverse effect of loop [high-ceiling] diuretics, initial encounter: Secondary | ICD-10-CM | POA: Diagnosis present

## 2024-02-09 DIAGNOSIS — E119 Type 2 diabetes mellitus without complications: Secondary | ICD-10-CM

## 2024-02-09 DIAGNOSIS — I480 Paroxysmal atrial fibrillation: Secondary | ICD-10-CM | POA: Diagnosis present

## 2024-02-09 DIAGNOSIS — I890 Lymphedema, not elsewhere classified: Secondary | ICD-10-CM

## 2024-02-09 DIAGNOSIS — I5033 Acute on chronic diastolic (congestive) heart failure: Secondary | ICD-10-CM

## 2024-02-09 DIAGNOSIS — K529 Noninfective gastroenteritis and colitis, unspecified: Secondary | ICD-10-CM

## 2024-02-09 DIAGNOSIS — A044 Other intestinal Escherichia coli infections: Secondary | ICD-10-CM | POA: Diagnosis present

## 2024-02-09 DIAGNOSIS — R54 Age-related physical debility: Secondary | ICD-10-CM | POA: Diagnosis present

## 2024-02-09 DIAGNOSIS — R6520 Severe sepsis without septic shock: Secondary | ICD-10-CM | POA: Diagnosis present

## 2024-02-09 DIAGNOSIS — I130 Hypertensive heart and chronic kidney disease with heart failure and stage 1 through stage 4 chronic kidney disease, or unspecified chronic kidney disease: Secondary | ICD-10-CM | POA: Diagnosis present

## 2024-02-09 DIAGNOSIS — I69344 Monoplegia of lower limb following cerebral infarction affecting left non-dominant side: Secondary | ICD-10-CM

## 2024-02-09 DIAGNOSIS — L03116 Cellulitis of left lower limb: Principal | ICD-10-CM | POA: Diagnosis present

## 2024-02-09 DIAGNOSIS — Z803 Family history of malignant neoplasm of breast: Secondary | ICD-10-CM

## 2024-02-09 DIAGNOSIS — Z7901 Long term (current) use of anticoagulants: Secondary | ICD-10-CM

## 2024-02-09 DIAGNOSIS — Z1152 Encounter for screening for COVID-19: Secondary | ICD-10-CM

## 2024-02-09 DIAGNOSIS — I482 Chronic atrial fibrillation, unspecified: Secondary | ICD-10-CM | POA: Diagnosis present

## 2024-02-09 DIAGNOSIS — G40909 Epilepsy, unspecified, not intractable, without status epilepticus: Secondary | ICD-10-CM | POA: Diagnosis present

## 2024-02-09 DIAGNOSIS — E872 Acidosis, unspecified: Secondary | ICD-10-CM | POA: Diagnosis present

## 2024-02-09 DIAGNOSIS — Z923 Personal history of irradiation: Secondary | ICD-10-CM

## 2024-02-09 DIAGNOSIS — A419 Sepsis, unspecified organism: Principal | ICD-10-CM | POA: Diagnosis present

## 2024-02-09 DIAGNOSIS — Z89421 Acquired absence of other right toe(s): Secondary | ICD-10-CM

## 2024-02-09 DIAGNOSIS — I9589 Other hypotension: Secondary | ICD-10-CM | POA: Diagnosis present

## 2024-02-09 DIAGNOSIS — E1122 Type 2 diabetes mellitus with diabetic chronic kidney disease: Secondary | ICD-10-CM | POA: Diagnosis present

## 2024-02-09 LAB — PHOSPHORUS: Phosphorus: 4.3 mg/dL (ref 2.5–4.6)

## 2024-02-09 LAB — GASTROINTESTINAL PANEL BY PCR, STOOL (REPLACES STOOL CULTURE)
Adenovirus F40/41: NOT DETECTED
Astrovirus: NOT DETECTED
Campylobacter species: NOT DETECTED
Cryptosporidium: NOT DETECTED
Cyclospora cayetanensis: NOT DETECTED
Entamoeba histolytica: NOT DETECTED
Enteroaggregative E coli (EAEC): DETECTED — AB
Enteropathogenic E coli (EPEC): NOT DETECTED
Enterotoxigenic E coli (ETEC): NOT DETECTED
Giardia lamblia: NOT DETECTED
Norovirus GI/GII: DETECTED — AB
Plesimonas shigelloides: NOT DETECTED
Rotavirus A: NOT DETECTED
Salmonella species: NOT DETECTED
Sapovirus (I, II, IV, and V): NOT DETECTED
Shiga like toxin producing E coli (STEC): NOT DETECTED
Shigella/Enteroinvasive E coli (EIEC): NOT DETECTED
Vibrio cholerae: NOT DETECTED
Vibrio species: NOT DETECTED
Yersinia enterocolitica: NOT DETECTED

## 2024-02-09 LAB — CBC WITH DIFFERENTIAL/PLATELET
Abs Immature Granulocytes: 0.14 10*3/uL — ABNORMAL HIGH (ref 0.00–0.07)
Basophils Absolute: 0.1 10*3/uL (ref 0.0–0.1)
Basophils Relative: 0 %
Eosinophils Absolute: 0 10*3/uL (ref 0.0–0.5)
Eosinophils Relative: 0 %
HCT: 39 % (ref 36.0–46.0)
Hemoglobin: 13.2 g/dL (ref 12.0–15.0)
Immature Granulocytes: 1 %
Lymphocytes Relative: 2 %
Lymphs Abs: 0.5 10*3/uL — ABNORMAL LOW (ref 0.7–4.0)
MCH: 30.8 pg (ref 26.0–34.0)
MCHC: 33.8 g/dL (ref 30.0–36.0)
MCV: 91.1 fL (ref 80.0–100.0)
Monocytes Absolute: 0.6 10*3/uL (ref 0.1–1.0)
Monocytes Relative: 3 %
Neutro Abs: 18.9 10*3/uL — ABNORMAL HIGH (ref 1.7–7.7)
Neutrophils Relative %: 94 %
Platelets: 245 10*3/uL (ref 150–400)
RBC: 4.28 MIL/uL (ref 3.87–5.11)
RDW: 13.4 % (ref 11.5–15.5)
WBC: 20.2 10*3/uL — ABNORMAL HIGH (ref 4.0–10.5)
nRBC: 0 % (ref 0.0–0.2)

## 2024-02-09 LAB — PROTIME-INR
INR: 2.1 — ABNORMAL HIGH (ref 0.8–1.2)
Prothrombin Time: 23.9 s — ABNORMAL HIGH (ref 11.4–15.2)

## 2024-02-09 LAB — COMPREHENSIVE METABOLIC PANEL WITH GFR
ALT: 18 U/L (ref 0–44)
AST: 25 U/L (ref 15–41)
Albumin: 3.4 g/dL — ABNORMAL LOW (ref 3.5–5.0)
Alkaline Phosphatase: 75 U/L (ref 38–126)
Anion gap: 17 — ABNORMAL HIGH (ref 5–15)
BUN: 44 mg/dL — ABNORMAL HIGH (ref 8–23)
CO2: 26 mmol/L (ref 22–32)
Calcium: 8.8 mg/dL — ABNORMAL LOW (ref 8.9–10.3)
Chloride: 91 mmol/L — ABNORMAL LOW (ref 98–111)
Creatinine, Ser: 2.14 mg/dL — ABNORMAL HIGH (ref 0.44–1.00)
GFR, Estimated: 24 mL/min — ABNORMAL LOW (ref >60)
Glucose, Bld: 248 mg/dL — ABNORMAL HIGH (ref 70–99)
Potassium: 2.3 mmol/L — CL (ref 3.5–5.1)
Sodium: 134 mmol/L — ABNORMAL LOW (ref 135–145)
Total Bilirubin: 1.6 mg/dL — ABNORMAL HIGH (ref 0.0–1.2)
Total Protein: 7.3 g/dL (ref 6.5–8.1)

## 2024-02-09 LAB — CBC
Hematocrit: 40.4 % (ref 34.0–46.6)
Hemoglobin: 13.2 g/dL (ref 11.1–15.9)
MCH: 30.3 pg (ref 26.6–33.0)
MCHC: 32.7 g/dL (ref 31.5–35.7)
MCV: 93 fL (ref 79–97)
Platelets: 280 10*3/uL (ref 150–450)
RBC: 4.35 x10E6/uL (ref 3.77–5.28)
RDW: 12.6 % (ref 11.7–15.4)
WBC: 25.1 10*3/uL (ref 3.4–10.8)

## 2024-02-09 LAB — BASIC METABOLIC PANEL WITH GFR
BUN/Creatinine Ratio: 18 (ref 12–28)
BUN: 34 mg/dL — ABNORMAL HIGH (ref 8–27)
CO2: 22 mmol/L (ref 20–29)
Calcium: 9 mg/dL (ref 8.7–10.3)
Chloride: 92 mmol/L — ABNORMAL LOW (ref 96–106)
Creatinine, Ser: 1.84 mg/dL — ABNORMAL HIGH (ref 0.57–1.00)
Glucose: 173 mg/dL — ABNORMAL HIGH (ref 70–99)
Potassium: 2.9 mmol/L — CL (ref 3.5–5.2)
Sodium: 138 mmol/L (ref 134–144)
eGFR: 29 mL/min/{1.73_m2} — ABNORMAL LOW (ref >59)

## 2024-02-09 LAB — RESP PANEL BY RT-PCR (RSV, FLU A&B, COVID)  RVPGX2
Influenza A by PCR: NEGATIVE
Influenza B by PCR: NEGATIVE
Resp Syncytial Virus by PCR: NEGATIVE
SARS Coronavirus 2 by RT PCR: NEGATIVE

## 2024-02-09 LAB — LACTIC ACID, PLASMA
Lactic Acid, Venous: 2.4 mmol/L (ref 0.5–1.9)
Lactic Acid, Venous: 2.8 mmol/L (ref 0.5–1.9)
Lactic Acid, Venous: 2.5 mmol/L (ref 0.5–1.9)

## 2024-02-09 LAB — MAGNESIUM: Magnesium: 1.9 mg/dL (ref 1.7–2.4)

## 2024-02-09 LAB — CBG MONITORING, ED
Glucose-Capillary: 168 mg/dL — ABNORMAL HIGH (ref 70–99)
Glucose-Capillary: 153 mg/dL — ABNORMAL HIGH (ref 70–99)

## 2024-02-09 LAB — PROCALCITONIN: Procalcitonin: 1.22 ng/mL

## 2024-02-09 LAB — POTASSIUM: Potassium: 2.4 mmol/L — CL (ref 3.5–5.1)

## 2024-02-09 MED ORDER — POTASSIUM CHLORIDE 10 MEQ/100ML IV SOLN
10.0000 meq | INTRAVENOUS | Status: AC
Start: 2024-02-09 — End: 2024-02-10
  Administered 2024-02-09 (×2): 10 meq via INTRAVENOUS
  Filled 2024-02-09 (×2): qty 100

## 2024-02-09 MED ORDER — POTASSIUM CHLORIDE CRYS ER 20 MEQ PO TBCR
40.0000 meq | EXTENDED_RELEASE_TABLET | ORAL | Status: AC
  Administered 2024-02-09 (×2): 40 meq via ORAL
  Filled 2024-02-09 (×4): qty 2

## 2024-02-09 MED ORDER — APIXABAN 5 MG PO TABS
5.0000 mg | ORAL_TABLET | Freq: Two times a day (BID) | ORAL | Status: DC
  Administered 2024-02-09 – 2024-02-13 (×8): 5 mg via ORAL
  Filled 2024-02-09 (×8): qty 1

## 2024-02-09 MED ORDER — TAFAMIDIS MEGLUMINE (CARDIAC) 20 MG PO CAPS: 80.0000 mg | ORAL_CAPSULE | Freq: Every day | ORAL | Status: DC

## 2024-02-09 MED ORDER — VANCOMYCIN HCL 2000 MG/400ML IV SOLN
2000.0000 mg | Freq: Once | INTRAVENOUS | Status: AC
  Administered 2024-02-09: 2000 mg via INTRAVENOUS
  Filled 2024-02-09: qty 400

## 2024-02-09 MED ORDER — GABAPENTIN 300 MG PO CAPS: 300.0000 mg | ORAL_CAPSULE | Freq: Every evening | ORAL | Status: DC | PRN

## 2024-02-09 MED ORDER — ONDANSETRON HCL 4 MG/2ML IJ SOLN
4.0000 mg | Freq: Four times a day (QID) | INTRAMUSCULAR | Status: DC | PRN
Start: 2024-02-09 — End: 2024-02-13

## 2024-02-09 MED ORDER — NON FORMULARY: 80.0000 mg | Freq: Every day | Status: DC

## 2024-02-09 MED ORDER — PRAVASTATIN SODIUM 20 MG PO TABS
40.0000 mg | ORAL_TABLET | Freq: Every day | ORAL | Status: DC
  Administered 2024-02-10 – 2024-02-12 (×3): 40 mg via ORAL
  Filled 2024-02-09 (×3): qty 2

## 2024-02-09 MED ORDER — DAPAGLIFLOZIN PROPANEDIOL 10 MG PO TABS
10.0000 mg | ORAL_TABLET | Freq: Every day | ORAL | Status: DC
  Administered 2024-02-10 – 2024-02-13 (×4): 10 mg via ORAL
  Filled 2024-02-09 (×4): qty 1

## 2024-02-09 MED ORDER — SODIUM CHLORIDE 0.9 % IV SOLN: INTRAVENOUS | Status: AC

## 2024-02-09 MED ORDER — FUROSEMIDE 10 MG/ML IJ SOLN
40.0000 mg | Freq: Two times a day (BID) | INTRAMUSCULAR | Status: DC
  Administered 2024-02-09 – 2024-02-12 (×7): 40 mg via INTRAVENOUS
  Filled 2024-02-09 (×7): qty 4

## 2024-02-09 MED ORDER — SODIUM CHLORIDE 0.9 % IV BOLUS
1000.0000 mL | Freq: Once | INTRAVENOUS | Status: AC
  Administered 2024-02-09: 1000 mL via INTRAVENOUS

## 2024-02-09 MED ORDER — SODIUM CHLORIDE 0.9 % IV SOLN
2.0000 g | Freq: Once | INTRAVENOUS | Status: AC
  Administered 2024-02-09: 2 g via INTRAVENOUS
  Filled 2024-02-09: qty 12.5

## 2024-02-09 MED ORDER — INSULIN ASPART 100 UNIT/ML IJ SOLN
0.0000 [IU] | Freq: Three times a day (TID) | INTRAMUSCULAR | Status: DC
Start: 2024-02-09 — End: 2024-02-13
  Administered 2024-02-09 – 2024-02-10 (×2): 2 [IU] via SUBCUTANEOUS
  Administered 2024-02-10 – 2024-02-11 (×2): 1 [IU] via SUBCUTANEOUS
  Administered 2024-02-12: 3 [IU] via SUBCUTANEOUS
  Administered 2024-02-12 – 2024-02-13 (×3): 1 [IU] via SUBCUTANEOUS
  Filled 2024-02-09 (×8): qty 1

## 2024-02-09 MED ORDER — ACETAMINOPHEN 325 MG PO TABS
650.0000 mg | ORAL_TABLET | Freq: Four times a day (QID) | ORAL | Status: DC | PRN
  Administered 2024-02-09: 650 mg via ORAL
  Filled 2024-02-09: qty 2

## 2024-02-09 MED ORDER — ONDANSETRON HCL 4 MG PO TABS
4.0000 mg | ORAL_TABLET | Freq: Four times a day (QID) | ORAL | Status: DC | PRN
Start: 2024-02-09 — End: 2024-02-13

## 2024-02-09 MED ORDER — RISAQUAD PO CAPS
2.0000 | ORAL_CAPSULE | Freq: Three times a day (TID) | ORAL | Status: DC
  Administered 2024-02-09 – 2024-02-13 (×11): 2 via ORAL
  Filled 2024-02-09 (×11): qty 2

## 2024-02-09 NOTE — Telephone Encounter (Signed)
 Received page from [facility] about a critical lab.  Potassium level of 2.9.  Will send this documentation to the ordering provider for ongoing management and repletion.

## 2024-02-09 NOTE — ED Provider Notes (Signed)
 [facility] Provider Note    Event Date/Time   First MD Initiated Contact with Patient 02/09/24 1324     (approximate)   History   Abnormal Lab   HPI  Elizabeth [name] is a 73 y.o. female who presents to the ED for evaluation of Abnormal Lab   I review a [facility] cardiology clinic visit from yesterday.  History of stroke with left lower extremity weakness, DM, PAD, CKD, paroxysmal A-fib, diastolic CHF.  In the clinic she was complaining of fever, cough and chills.  Anticoagulated on Eliquis.  I reviewed blood work sent from the clinic yesterday, white count of 25k, slight worsening of renal dysfunction on top of CKD, hypokalemia.  Patient reports fevers, malaise and feeling unwell.  Has been reports that he has been having to require maximum assist, she has been able to get around to do typical ADLs due to generalized weakness, unable to walk.  No falls or syncope.   Physical Exam   Triage Vital Signs: ED Triage Vitals  Encounter Vitals Group     BP 02/09/24 1112 (!) 92/55     Systolic BP Percentile --      Diastolic BP Percentile --      Pulse Rate 02/09/24 1112 68     Resp 02/09/24 1112 18     Temp 02/09/24 1112 99.4 F (37.4 C)     Temp Source 02/09/24 1112 Oral     SpO2 02/09/24 1112 94 %     Weight --      Height --      Head Circumference --      Peak Flow --      Pain Score 02/09/24 1113 0     Pain Loc --      Pain Education --      Exclude from Growth Chart --     Most recent vital signs: Vitals:   02/09/24 1400 02/09/24 1530  BP: 129/86 97/65  Pulse: 65 67  Resp: 16 (!) 21  Temp:    SpO2: 100% 97%    General: Awake, no distress.  CV:  Good peripheral perfusion.  Resp:  Normal effort.  Abd:  No distention.  Soft and benign MSK:  No deformity noted.  Neuro:  No focal deficits appreciated. Other:  Edematous bilateral lower extremities but asymmetric skin color with hot redness over the left lower extremity consistent with  cellulitis.   ED Results / Procedures / Treatments   Labs (all labs ordered are listed, but only abnormal results are displayed) Labs Reviewed  COMPREHENSIVE METABOLIC PANEL WITH GFR - Abnormal; Notable for the following components:      Result Value   Sodium 134 (*)    Potassium 2.3 (*)    Chloride 91 (*)    Glucose, Bld 248 (*)    BUN 44 (*)    Creatinine, Ser 2.14 (*)    Calcium 8.8 (*)    Albumin 3.4 (*)    Total Bilirubin 1.6 (*)    GFR, Estimated 24 (*)    Anion gap 17 (*)    All other components within normal limits  LACTIC ACID, PLASMA - Abnormal; Notable for the following components:   Lactic Acid, Venous 2.4 (*)    All other components within normal limits  LACTIC ACID, PLASMA - Abnormal; Notable for the following components:   Lactic Acid, Venous 2.8 (*)    All other components within normal limits  CBC WITH DIFFERENTIAL/PLATELET - Abnormal;  Notable for the following components:   WBC 20.2 (*)    Neutro Abs 18.9 (*)    Lymphs Abs 0.5 (*)    Abs Immature Granulocytes 0.14 (*)    All other components within normal limits  PROTIME-INR - Abnormal; Notable for the following components:   Prothrombin Time 23.9 (*)    INR 2.1 (*)    All other components within normal limits  RESP PANEL BY RT-PCR (RSV, FLU A&B, COVID)  RVPGX2  CULTURE, BLOOD (ROUTINE X 2)  CULTURE, BLOOD (ROUTINE X 2)  MRSA NEXT GEN BY PCR, NASAL  MAGNESIUM  PROCALCITONIN  URINALYSIS, W/ REFLEX TO CULTURE (INFECTION SUSPECTED)  POTASSIUM  PHOSPHORUS  HEMOGLOBIN A1C  LACTIC ACID, PLASMA    EKG A-fib with a rate of 61 bpm.  Leftward axis and no clear signs of acute ischemia.  RADIOLOGY CXR interpreted by me without evidence of acute cardiopulmonary pathology.  Official radiology report(s): DG Chest 2 View Result Date: 02/09/2024 CLINICAL DATA:  Sepsis, weakness. EXAM: CHEST - 2 VIEW COMPARISON:  July 02, 2023. FINDINGS: The heart size and mediastinal contours are within normal limits. Both  lungs are clear. The visualized skeletal structures are unremarkable. IMPRESSION: No active cardiopulmonary disease. Electronically Signed   By: Lupita Raider M.D.   On: 02/09/2024 12:43    PROCEDURES and INTERVENTIONS:  .Critical Care  Performed by: Delton Prairie, MD Authorized by: Delton Prairie, MD   Critical care provider statement:    Critical care time (minutes):  30   Critical care time was exclusive of:  Separately billable procedures and treating other patients   Critical care was necessary to treat or prevent imminent or life-threatening deterioration of the following conditions:  Sepsis   Critical care was time spent personally by me on the following activities:  Development of treatment plan with patient or surrogate, discussions with consultants, evaluation of patient's response to treatment, examination of patient, ordering and review of laboratory studies, ordering and review of radiographic studies, ordering and performing treatments and interventions, pulse oximetry, re-evaluation of patient's condition and review of old charts   Medications  vancomycin (VANCOREADY) IVPB 2000 mg/400 mL (2,000 mg Intravenous New Bag/Given 02/09/24 1550)  0.9 %  sodium chloride infusion ( Intravenous New Bag/Given 02/09/24 1549)  potassium chloride SA (KLOR-CON M) CR tablet 40 mEq (has no administration in time range)  acetaminophen (TYLENOL) tablet 650 mg (has no administration in time range)  furosemide (LASIX) injection 40 mg (has no administration in time range)  pravastatin (PRAVACHOL) tablet 40 mg (has no administration in time range)  Tafamidis Meglumine (Cardiac) CAPS 80 mg (has no administration in time range)  dapagliflozin propanediol (FARXIGA) tablet 10 mg (has no administration in time range)  apixaban (ELIQUIS) tablet 5 mg (has no administration in time range)  gabapentin (NEURONTIN) capsule 300 mg (has no administration in time range)  ondansetron (ZOFRAN) tablet 4 mg (has no  administration in time range)    Or  ondansetron (ZOFRAN) injection 4 mg (has no administration in time range)  insulin aspart (novoLOG) injection 0-9 Units (has no administration in time range)  NON FORMULARY 80 mg (has no administration in time range)  ceFEPIme (MAXIPIME) 2 g in sodium chloride 0.9 % 100 mL IVPB (0 g Intravenous Stopped 02/09/24 1551)  sodium chloride 0.9 % bolus 1,000 mL (0 mLs Intravenous Stopped 02/09/24 1551)     IMPRESSION / MDM / ASSESSMENT AND PLAN / ED COURSE  I reviewed the triage vital signs and  the nursing notes.  Differential diagnosis includes, but is not limited to, sepsis, UTI, cellulitis, pneumonia, viral syndrome  {Patient presents with symptoms of an acute illness or injury that is potentially life-threatening. . Patient presents with signs of sepsis from cellulitis to her left lower extremity requiring medical admission and antibiotics.  Leukocytosis, elevated respiratory rate alongside infectious left leg indicate sepsis protocols.  Hypokalemia but normal magnesium.  Provide antibiotics and consult medicine for admission.  Clinical Course as of 02/09/24 1554  Thu Feb 09, 2024  1408 Left lower leg [DS]  1434 I consult with medicine who agrees to admit [DS]    Clinical Course User Index [DS] Delton Prairie, MD     FINAL CLINICAL IMPRESSION(S) / ED DIAGNOSES   Final diagnoses:  Cellulitis of left lower extremity  Hypokalemia     Rx / DC Orders   ED Discharge Orders     None        Note:  This document was prepared using Dragon voice recognition software and may include unintentional dictation errors.   Delton Prairie, MD 02/09/24 [phone]

## 2024-02-09 NOTE — Telephone Encounter (Signed)
-----   Message from [facility] sent at 02/09/2024  8:17 AM EDT ----- Pleas have patient report to the emergency department for further work-up. She was complaining of fever and chills yesterday. Her white count is elevated, her kidney function has worsened, and her potassium is lower than we can safely fix with oral s upplements as she is already taking oral supplements daily.

## 2024-02-09 NOTE — Telephone Encounter (Signed)
 The patient has been made aware to proceed to the ED. She will have a family member take her

## 2024-02-09 NOTE — ED Notes (Signed)
 Pt unable to tolerate second dose of potassium. Could not swallow and spit into emesis bag. Chipper Herb, MD, made aware.

## 2024-02-09 NOTE — ED Notes (Signed)
 Pt incontinent to bladder. Pt cleaned and new brief and sheet applied.

## 2024-02-09 NOTE — ED Triage Notes (Addendum)
 Patient presents for abnormal potassium along with other labs per her cardiologist. Saw cardiologist yesterday.   Reports weakness and SOB.

## 2024-02-09 NOTE — ED Notes (Signed)
 Pt incontinent of bowel. Pt had large amount of loose and watery stool. Pt cleaned by this RN and Annabelle Harman, Charity fundraiser. Clean sheet on bed. Pt in clean hospital gown and brief. Warm blankets given. Chipper Herb, MD, made aware of pt's diarrhea.

## 2024-02-09 NOTE — Sepsis Progress Note (Signed)
 Elink monitoring for the code sepsis protocol.

## 2024-02-09 NOTE — Progress Notes (Signed)
 Pleas have patient report to the emergency department for further work-up. She was complaining of fever and chills yesterday. Her white count is elevated, her kidney function has worsened, and her potassium is lower than we can safely fix with oral supplements as she is already taking oral supplements daily.

## 2024-02-09 NOTE — Consult Note (Signed)
 Pharmacy Antibiotic Note  Elizabeth [name] is a 73 y.o. female admitted on 02/09/2024 with cellulitis.  Pharmacy has been consulted for cefepime and vancomycin dosing. Afeb, WBC 25 > 20, procal 1.22, lactic acid 2.8, Scr trending up. She was complaining of fever and chills yesterday. MD note pending.   Plan: Will start cefepime 2 g q24H.   Pt was ordered vancomycin 2000 mg x 1 loading dose. Due to elevated Scr. Will dose by levels. Will order a ~24 hour level.      Temp (24hrs), Avg:99.4 F (37.4 C), Min:99.4 F (37.4 C), Max:99.4 F (37.4 C)  Recent Labs  Lab 02/08/24 1359 02/09/24 1120 02/09/24 1436  WBC 25.1* 20.2*  --   CREATININE 1.84* 2.14*  --   LATICACIDVEN  --  2.4* 2.8*    Estimated Creatinine Clearance: 24.6 mL/min (A) (by C-G formula based on SCr of 2.14 mg/dL (H)).    Allergies  Allergen Reactions   Levaquin [Levofloxacin] Other (See Comments)    Partial seizure    Antimicrobials this admission: 4/3 cefepime >>  4/3 vancomycin >>   Dose adjustments this admission: none  Microbiology results: 4/3 BCx: pending   Thank you for allowing pharmacy to be a part of this patient's care.  Ronnald Ramp, PharmD, BCPS 02/09/2024 3:27 PM

## 2024-02-09 NOTE — Telephone Encounter (Signed)
 Left a message for the patient to call back.

## 2024-02-09 NOTE — ED Notes (Signed)
 Pt arrives to new room. Pt incontinint in brief. Pt cleaned up and placed in to clean brief and gown. Pt adjusted to a level of comfort. Call light within reach. Pt denies and needs at this time.

## 2024-02-09 NOTE — H&P (Signed)
 History and Physical    Elizabeth [name] ZOX:[id number] DOB: 1951-05-25 DOA: 02/09/2024  PCP: Leanna Sato, MD (Confirm with patient/family/NH records and if not entered, this has to be entered at [facility] point of entry) Patient coming from: Home  I have personally briefly reviewed patient's old medical records in [facility]  Chief Complaint: Cardiologist sent me to ED  HPI: Elizabeth [name] is a 73 y.o. female with medical history significant of chronic A-fib on Eliquis, HTN, chronic HFpEF, chronic lymphedema, IIDM, CKD stage IIIb, diabetic neuropathy, seizure disorder on Dilantin, sent from cardiologist office for evaluation of severe hypokalemia.  Patient was started on Lasix 5 to 6 weeks ago to address worsening of bilateral lymphedema swelling, she also takes KCl supplement at the meantime.  Yesterday she went to see cardiology who performed a blood work in the office and today the blood work showed K= 2.9 and patient instructed to come to ED.  Patient also complaining about worsening of left lower extremity rash and pain for 2 to 3 weeks.  Feeling warm and episode of fever and chills on Sunday.  She denied any cough, no nausea vomiting no diarrhea denied any dysuria or urinary frequency.  ED Course: Afebrile, temperature 99.4, heart rate 68, blood pressure 97/65, O2 saturation 98% on room air.  Blood work showed K2.3, creatinine 2.1 compared to baseline 1.5-2.2, lactic acid 2.4, WBC 20.2.  Patient was given vancomycin and cefepime in the ED.  Review of Systems: As per HPI otherwise 14 point review of systems negative.    Past Medical History:  Diagnosis Date   Cancer [facility])    breast   Carcinoma of right breast (HCC) 05/20/2014   Diabetes mellitus without complication (HCC)    Hypertension    Lymphedema    Personal history of radiation therapy 2015   Right breast   PONV (postoperative nausea and vomiting)    Psoriasis 1990   Stroke (HCC) 2000   residual left arm weakness    Varicose veins     Past Surgical History:  Procedure Laterality Date   AMPUTATION Left 03/04/2021   Procedure: AMPUTATION RAY;  Surgeon: Linus Galas, DPM;  Location: [facility] ORS;  Service: Podiatry;  Laterality: Left;   AMPUTATION TOE Right 01/25/2017   Procedure: AMPUTATION TOE   ;  Surgeon: Recardo Evangelist, DPM;  Location: [facility] ORS;  Service: Podiatry;  Laterality: Right;   BREAST EXCISIONAL BIOPSY Right 2015   [facility] / with radiation   BREAST LUMPECTOMY Right 04-2014   followed by radiation,  no chemo   BREAST REDUCTION SURGERY  1977   CHOLECYSTECTOMY N/A 09/23/2015   Procedure: LAPAROSCOPIC CHOLECYSTECTOMY;  Surgeon: Geoffry Paradise, MD;  Location: [facility] ORS;  Service: General;  Laterality: N/A;   DILATION AND CURETTAGE OF UTERUS  2010   EYE SURGERY Left 2015   cataracts and left eye detached retina repair   EYE SURGERY Right 2013   cataract with len implant   GASTRIC ROUX-EN-Y N/A 09/23/2015   Procedure: LAPAROSCOPIC ROUX-EN-Y GASTRIC;  Surgeon: Geoffry Paradise, MD;  Location: [facility] ORS;  Service: General;  Laterality: N/A;   HIATAL HERNIA REPAIR N/A 09/23/2015   Procedure: LAPAROSCOPIC REPAIR OF HIATAL HERNIA;  Surgeon: Geoffry Paradise, MD;  Location: [facility] ORS;  Service: General;  Laterality: N/A;   IRRIGATION AND DEBRIDEMENT FOOT Right 01/25/2017   Procedure: IRRIGATION AND DEBRIDEMENT FOOT;  Surgeon: Recardo Evangelist, DPM;  Location: [facility] ORS;  Service: Podiatry;  Laterality: Right;   IRRIGATION  AND DEBRIDEMENT FOOT Right 01/29/2017   Procedure: IRRIGATION AND DEBRIDEMENT FOOT and application of wound vac;  Surgeon: Recardo Evangelist, DPM;  Location: [facility] ORS;  Service: Podiatry;  Laterality: Right;   LOWER EXTREMITY ANGIOGRAPHY Right 01/28/2017   Procedure: Lower Extremity Angiography;  Surgeon: Renford Dills, MD;  Location: [facility] INVASIVE [facility];  Service: Cardiovascular;  Laterality: Right;   LOWER EXTREMITY ANGIOGRAPHY Left 03/06/2021   Procedure: Lower Extremity Angiography;  Surgeon: Renford Dills, MD;  Location: [facility];  Service: Cardiovascular;  Laterality: Left;   LOWER EXTREMITY INTERVENTION  01/28/2017   Procedure: Lower Extremity Intervention;  Surgeon: Renford Dills, MD;  Location: [facility];  Service: Cardiovascular;;   REDUCTION MAMMAPLASTY       reports that she has never smoked. She has never been exposed to tobacco smoke. She has never used smokeless tobacco. She reports that she does not drink alcohol and does not use drugs.  Allergies  Allergen Reactions   Levaquin [Levofloxacin] Other (See Comments)    Partial seizure    Family History  Problem Relation Age of Onset   Diabetes Mother    Cancer Mother    Breast cancer Mother 37   Diabetes Father    Diabetes Sister    Diabetes Brother      Prior to Admission medications   Medication Sig Start Date End Date Taking? Authorizing Provider  acetaminophen (TYLENOL) 325 MG tablet Take 2 tablets (650 mg total) by mouth every 6 (six) hours as needed for mild pain (or Fever >/= 101). 03/07/21   Pennie Banter, DO  apixaban (ELIQUIS) 5 MG TABS tablet Take 1 tablet (5 mg total) by mouth 2 (two) times daily. 10/25/23   Sabharwal, Aditya, DO  Calcium Carb-Cholecalciferol (CALCIUM 1000 + D PO) Take 1 tablet by mouth daily.    [provider]  dapagliflozin propanediol (FARXIGA) 10 MG TABS tablet Take 1 tablet (10 mg total) by mouth daily. 10/25/23   Sabharwal, Aditya, DO  furosemide (LASIX) 40 MG tablet Take 1 tablet (40 mg total) by mouth 2 (two) times daily. 12/26/23 03/25/24  Romie Minus, MD  gabapentin (NEURONTIN) 300 MG capsule Take 300 mg by mouth at bedtime as needed (leg pain). 06/14/23   [provider]  potassium chloride (KLOR-CON) 10 MEQ tablet Take 2 tablets (20 mEq total) by mouth daily. 01/26/24   Romie Minus, MD  pravastatin (PRAVACHOL) 40 MG tablet Take 1 tablet (40 mg total) by mouth daily. In afternoon 10/25/23   Sabharwal, Aditya, DO   Tafamidis Meglumine, Cardiac, 20 MG CAPS Take 4 capsules (80 mg total) by mouth daily. 01/26/24   Romie Minus, MD  TRUE METRIX BLOOD GLUCOSE TEST test strip  10/13/18   [provider]  TRUEPLUS LANCETS 33G MISC  10/13/18   [provider]    Physical Exam: Vitals:   02/09/24 1330 02/09/24 1358 02/09/24 1400 02/09/24 1530  BP:  114/66 129/86 97/65  Pulse: (!) 57 68 65 67  Resp: (!) 22 16 16  (!) 21  Temp:      TempSrc:      SpO2: 96% 98% 100% 97%    Constitutional: NAD, calm, comfortable Vitals:   02/09/24 1330 02/09/24 1358 02/09/24 1400 02/09/24 1530  BP:  114/66 129/86 97/65  Pulse: (!) 57 68 65 67  Resp: (!) 22 16 16  (!) 21  Temp:      TempSrc:      SpO2: 96%  98% 100% 97%   Eyes: PERRL, lids and conjunctivae normal ENMT: Mucous membranes are moist. Posterior pharynx clear of any exudate or lesions.Normal dentition.  Neck: normal, supple, no masses, no thyromegaly Respiratory: clear to auscultation bilaterally, no wheezing, no crackles. Normal respiratory effort. No accessory muscle use.  Cardiovascular: Regular rate and rhythm, no murmurs / rubs / gallops. 2+ extremity edema. 2+ pedal pulses. No carotid bruits.  Abdomen: no tenderness, no masses palpated. No hepatosplenomegaly. Bowel sounds positive.  Musculoskeletal: no clubbing / cyanosis. No joint deformity upper and lower extremities. Good ROM, no contractures. Normal muscle tone.  Skin: Bilateral lower extremities chronic lymphedema like changes, macular rash on left lower extremity below the knee, warm to touch, tenderness compared to right side Neurologic: CN 2-12 grossly intact. Sensation intact, DTR normal. Strength 5/5 in all 4.  Psychiatric: Normal judgment and insight. Alert and oriented x 3. Normal mood.     Labs on Admission: I have personally reviewed following labs and imaging studies  CBC: Recent Labs  Lab 02/08/24 1359 02/09/24 1120  WBC 25.1* 20.2*  NEUTROABS  --  18.9*   HGB 13.2 13.2  HCT 40.4 39.0  MCV 93 91.1  PLT 280 245   Basic Metabolic Panel: Recent Labs  Lab 02/08/24 1359 02/09/24 1120  NA 138 134*  K 2.9* 2.3*  CL 92* 91*  CO2 22 26  GLUCOSE 173* 248*  BUN 34* 44*  CREATININE 1.84* 2.14*  CALCIUM 9.0 8.8*  MG  --  1.9   GFR: Estimated Creatinine Clearance: 24.6 mL/min (A) (by C-G formula based on SCr of 2.14 mg/dL (H)). Liver Function Tests: Recent Labs  Lab 02/09/24 1120  AST 25  ALT 18  ALKPHOS 75  BILITOT 1.6*  PROT 7.3  ALBUMIN 3.4*   No results for input(s): "LIPASE", "AMYLASE" in the last 168 hours. No results for input(s): "AMMONIA" in the last 168 hours. Coagulation Profile: Recent Labs  Lab 02/09/24 1120  INR 2.1*   Cardiac Enzymes: No results for input(s): "CKTOTAL", "CKMB", "CKMBINDEX", "TROPONINI" in the last 168 hours. BNP (last 3 results) No results for input(s): "PROBNP" in the last 8760 hours. HbA1C: No results for input(s): "HGBA1C" in the last 72 hours. CBG: No results for input(s): "GLUCAP" in the last 168 hours. Lipid Profile: No results for input(s): "CHOL", "HDL", "LDLCALC", "TRIG", "CHOLHDL", "LDLDIRECT" in the last 72 hours. Thyroid Function Tests: No results for input(s): "TSH", "T4TOTAL", "FREET4", "T3FREE", "THYROIDAB" in the last 72 hours. Anemia Panel: No results for input(s): "VITAMINB12", "FOLATE", "FERRITIN", "TIBC", "IRON", "RETICCTPCT" in the last 72 hours. Urine analysis:    Component Value Date/Time   COLORURINE YELLOW (A) 10/02/2023 1555   APPEARANCEUR HAZY (A) 10/02/2023 1555   APPEARANCEUR Cloudy 01/05/2012 1249   LABSPEC 1.010 10/02/2023 1555   LABSPEC 1.027 01/05/2012 1249   PHURINE 5.0 10/02/2023 1555   GLUCOSEU NEGATIVE 10/02/2023 1555   GLUCOSEU 150 mg/dL 16/08/9603 5409   HGBUR NEGATIVE 10/02/2023 1555   BILIRUBINUR NEGATIVE 10/02/2023 1555   BILIRUBINUR Negative 01/05/2012 1249   KETONESUR NEGATIVE 10/02/2023 1555   PROTEINUR NEGATIVE 10/02/2023 1555    NITRITE NEGATIVE 10/02/2023 1555   LEUKOCYTESUR MODERATE (A) 10/02/2023 1555   LEUKOCYTESUR Negative 01/05/2012 1249    Radiological Exams on Admission: DG Chest 2 View Result Date: 02/09/2024 CLINICAL DATA:  Sepsis, weakness. EXAM: CHEST - 2 VIEW COMPARISON:  July 02, 2023. FINDINGS: The heart size and mediastinal contours are within normal limits. Both lungs are clear. The visualized skeletal structures  are unremarkable. IMPRESSION: No active cardiopulmonary disease. Electronically Signed   By: Lupita Raider M.D.   On: 02/09/2024 12:43    EKG: Independently reviewed.  A-fib, rate controlled, no acute ST changes.  Assessment/Plan Principal Problem:   Hypokalemia Active Problems:   Cellulitis   Lymphedema  (please populate well all problems here in Problem List. (For example, if patient is on BP meds at home and you resume or decide to hold them, it is a problem that needs to be her. Same for CAD, COPD, HLD and so on)  Sepsis -Without acute endorgan damage, sepsis evidenced by elevated WBC count, elevated lactic acid level, source infection is left lower extremity cellulitis.  Due to significant lymphedema and peripheral swelling, and patient has a rather stable BP, no IV bolus was given. -According to cellulitis protocol, start vancomycin and cefepime -X-ray ordered of lower extremity to rule out foreign bodies -Given that patient has been on Eliquis, with a low suspicion for DVT.  DVT study not ordered.  Hypokalemia -Likely secondary to her Lasix. -P.o. replacement, magnesium and phosphorus level pending.  Recheck K level tonight and tomorrow  Chronic lymphedema -Still has significant signs of overload -Change p.o. Lasix to IV Lasix  Recently diagnosed cardiac amyloidosis Chronic HFpEF Chronic hypotension -Fluid overload, IV Lasix -Outpatient follow-up with cardiology  PAF -Controlled, continue Eliquis  CKD stage IIIb -Volume overloaded, on IV Lasix -Creatinine level  stable  IIDM -SSI   DVT prophylaxis: Eliquis Code Status: Full code Family Communication: None at bedside Disposition Plan: Expect less than 2 midnight hospital stay Consults called: None none Admission status: Telemetry observation   Emeline General MD [facility] Pager [phone]  02/09/2024, 4:07 PM

## 2024-02-09 NOTE — Sepsis Progress Note (Signed)
 Notified provider and bedside nurse of need to order and draw lactic acid #3 @1630 .

## 2024-02-09 NOTE — Progress Notes (Signed)
 CODE SEPSIS - PHARMACY COMMUNICATION  **Broad Spectrum Antibiotics should be administered within 1 hour of Sepsis diagnosis**  Time Code Sepsis Called/Page Received: 1409  Antibiotics Ordered: cefepime, vanc  Time of 1st antibiotic administration: 1448  Additional action taken by pharmacy:    If necessary, Name of Provider/Nurse Contacted:      Angelique Blonder ,PharmD Clinical Pharmacist  02/09/2024  2:51 PM

## 2024-02-10 DIAGNOSIS — T501X5A Adverse effect of loop [high-ceiling] diuretics, initial encounter: Secondary | ICD-10-CM | POA: Diagnosis present

## 2024-02-10 DIAGNOSIS — K529 Noninfective gastroenteritis and colitis, unspecified: Secondary | ICD-10-CM | POA: Diagnosis not present

## 2024-02-10 DIAGNOSIS — I43 Cardiomyopathy in diseases classified elsewhere: Secondary | ICD-10-CM | POA: Diagnosis present

## 2024-02-10 DIAGNOSIS — I5033 Acute on chronic diastolic (congestive) heart failure: Secondary | ICD-10-CM

## 2024-02-10 DIAGNOSIS — A419 Sepsis, unspecified organism: Secondary | ICD-10-CM

## 2024-02-10 DIAGNOSIS — N1832 Chronic kidney disease, stage 3b: Secondary | ICD-10-CM | POA: Diagnosis present

## 2024-02-10 DIAGNOSIS — I9589 Other hypotension: Secondary | ICD-10-CM | POA: Diagnosis present

## 2024-02-10 DIAGNOSIS — E119 Type 2 diabetes mellitus without complications: Secondary | ICD-10-CM

## 2024-02-10 DIAGNOSIS — I69344 Monoplegia of lower limb following cerebral infarction affecting left non-dominant side: Secondary | ICD-10-CM | POA: Diagnosis not present

## 2024-02-10 DIAGNOSIS — Z1152 Encounter for screening for COVID-19: Secondary | ICD-10-CM | POA: Diagnosis not present

## 2024-02-10 DIAGNOSIS — G40909 Epilepsy, unspecified, not intractable, without status epilepticus: Secondary | ICD-10-CM | POA: Diagnosis present

## 2024-02-10 DIAGNOSIS — L03115 Cellulitis of right lower limb: Secondary | ICD-10-CM

## 2024-02-10 DIAGNOSIS — Z7901 Long term (current) use of anticoagulants: Secondary | ICD-10-CM | POA: Diagnosis not present

## 2024-02-10 DIAGNOSIS — R54 Age-related physical debility: Secondary | ICD-10-CM | POA: Diagnosis present

## 2024-02-10 DIAGNOSIS — I482 Chronic atrial fibrillation, unspecified: Secondary | ICD-10-CM | POA: Diagnosis present

## 2024-02-10 DIAGNOSIS — I890 Lymphedema, not elsewhere classified: Secondary | ICD-10-CM | POA: Diagnosis present

## 2024-02-10 DIAGNOSIS — A044 Other intestinal Escherichia coli infections: Secondary | ICD-10-CM | POA: Diagnosis present

## 2024-02-10 DIAGNOSIS — R6520 Severe sepsis without septic shock: Secondary | ICD-10-CM | POA: Diagnosis present

## 2024-02-10 DIAGNOSIS — I480 Paroxysmal atrial fibrillation: Secondary | ICD-10-CM | POA: Diagnosis present

## 2024-02-10 DIAGNOSIS — E876 Hypokalemia: Secondary | ICD-10-CM | POA: Diagnosis present

## 2024-02-10 DIAGNOSIS — E1122 Type 2 diabetes mellitus with diabetic chronic kidney disease: Secondary | ICD-10-CM | POA: Diagnosis present

## 2024-02-10 DIAGNOSIS — E854 Organ-limited amyloidosis: Secondary | ICD-10-CM | POA: Diagnosis present

## 2024-02-10 DIAGNOSIS — I130 Hypertensive heart and chronic kidney disease with heart failure and stage 1 through stage 4 chronic kidney disease, or unspecified chronic kidney disease: Secondary | ICD-10-CM | POA: Diagnosis present

## 2024-02-10 DIAGNOSIS — L03116 Cellulitis of left lower limb: Secondary | ICD-10-CM | POA: Diagnosis present

## 2024-02-10 DIAGNOSIS — A0811 Acute gastroenteropathy due to Norwalk agent: Secondary | ICD-10-CM | POA: Diagnosis present

## 2024-02-10 DIAGNOSIS — E872 Acidosis, unspecified: Secondary | ICD-10-CM | POA: Diagnosis present

## 2024-02-10 DIAGNOSIS — E1140 Type 2 diabetes mellitus with diabetic neuropathy, unspecified: Secondary | ICD-10-CM | POA: Diagnosis present

## 2024-02-10 LAB — URINALYSIS, W/ REFLEX TO CULTURE (INFECTION SUSPECTED)
Bilirubin Urine: NEGATIVE
Glucose, UA: 150 mg/dL — AB
Ketones, ur: NEGATIVE mg/dL
Leukocytes,Ua: NEGATIVE
Nitrite: NEGATIVE
Protein, ur: NEGATIVE mg/dL
Specific Gravity, Urine: 1.006 (ref 1.005–1.030)
pH: 5 (ref 5.0–8.0)

## 2024-02-10 LAB — CBC
HCT: 34.4 % — ABNORMAL LOW (ref 36.0–46.0)
Hemoglobin: 11.6 g/dL — ABNORMAL LOW (ref 12.0–15.0)
MCH: 30.3 pg (ref 26.0–34.0)
MCHC: 33.7 g/dL (ref 30.0–36.0)
MCV: 89.8 fL (ref 80.0–100.0)
Platelets: 185 10*3/uL (ref 150–400)
RBC: 3.83 MIL/uL — ABNORMAL LOW (ref 3.87–5.11)
RDW: 13.4 % (ref 11.5–15.5)
WBC: 15.7 10*3/uL — ABNORMAL HIGH (ref 4.0–10.5)
nRBC: 0 % (ref 0.0–0.2)

## 2024-02-10 LAB — GLUCOSE, CAPILLARY
Glucose-Capillary: 145 mg/dL — ABNORMAL HIGH (ref 70–99)
Glucose-Capillary: 157 mg/dL — ABNORMAL HIGH (ref 70–99)
Glucose-Capillary: 115 mg/dL — ABNORMAL HIGH (ref 70–99)
Glucose-Capillary: 149 mg/dL — ABNORMAL HIGH (ref 70–99)

## 2024-02-10 LAB — BASIC METABOLIC PANEL WITH GFR
Anion gap: 11 (ref 5–15)
BUN: 44 mg/dL — ABNORMAL HIGH (ref 8–23)
CO2: 27 mmol/L (ref 22–32)
Calcium: 8.4 mg/dL — ABNORMAL LOW (ref 8.9–10.3)
Chloride: 101 mmol/L (ref 98–111)
Creatinine, Ser: 1.99 mg/dL — ABNORMAL HIGH (ref 0.44–1.00)
GFR, Estimated: 26 mL/min — ABNORMAL LOW (ref >60)
Glucose, Bld: 147 mg/dL — ABNORMAL HIGH (ref 70–99)
Potassium: 2.7 mmol/L — CL (ref 3.5–5.1)
Sodium: 139 mmol/L (ref 135–145)

## 2024-02-10 LAB — BRAIN NATRIURETIC PEPTIDE: B Natriuretic Peptide: 465.4 pg/mL — ABNORMAL HIGH (ref 0.0–100.0)

## 2024-02-10 LAB — VANCOMYCIN, RANDOM: Vancomycin Rm: 16 ug/mL

## 2024-02-10 LAB — LACTIC ACID, PLASMA: Lactic Acid, Venous: 1.4 mmol/L (ref 0.5–1.9)

## 2024-02-10 MED ORDER — SODIUM CHLORIDE 0.9 % IV SOLN: 2.0000 g | Freq: Once | INTRAVENOUS | Status: DC

## 2024-02-10 MED ORDER — TAFAMIDIS MEGLUMINE (CARDIAC) 20 MG PO CAPS
80.0000 mg | ORAL_CAPSULE | Freq: Every day | ORAL | Status: DC
  Administered 2024-02-10 – 2024-02-13 (×3): 80 mg via ORAL
  Filled 2024-02-10 (×4): qty 4

## 2024-02-10 MED ORDER — POTASSIUM CHLORIDE 10 MEQ/100ML IV SOLN
10.0000 meq | INTRAVENOUS | Status: DC
  Administered 2024-02-10: 10 meq via INTRAVENOUS
  Filled 2024-02-10: qty 100

## 2024-02-10 MED ORDER — NYSTATIN 100000 UNIT/GM EX POWD
Freq: Two times a day (BID) | CUTANEOUS | Status: DC
  Filled 2024-02-10 (×2): qty 15

## 2024-02-10 MED ORDER — VANCOMYCIN HCL IN DEXTROSE 1-5 GM/200ML-% IV SOLN: 1000.0000 mg | Freq: Once | INTRAVENOUS | Status: DC

## 2024-02-10 MED ORDER — POTASSIUM CHLORIDE 10 MEQ/100ML IV SOLN
10.0000 meq | INTRAVENOUS | Status: AC
  Administered 2024-02-10 (×5): 10 meq via INTRAVENOUS
  Filled 2024-02-10 (×3): qty 100

## 2024-02-10 MED ORDER — SODIUM CHLORIDE 0.9 % IV SOLN
2.0000 g | INTRAVENOUS | Status: DC
  Administered 2024-02-10: 2 g via INTRAVENOUS
  Filled 2024-02-10 (×2): qty 12.5

## 2024-02-10 MED ORDER — POTASSIUM CHLORIDE CRYS ER 20 MEQ PO TBCR
40.0000 meq | EXTENDED_RELEASE_TABLET | Freq: Once | ORAL | Status: AC
  Administered 2024-02-10: 40 meq via ORAL
  Filled 2024-02-10: qty 2

## 2024-02-10 NOTE — Assessment & Plan Note (Signed)
 Recently diagnosed cardiac amyloidosis. - Continue home tafamidis

## 2024-02-10 NOTE — Assessment & Plan Note (Signed)
 Patient with worsening lower extremity edema. -Check BNP -Continue with IV Lasix -Daily weight and BMP -Strict intake and output

## 2024-02-10 NOTE — Consult Note (Signed)
 Pharmacy Antibiotic Note  Elizabeth [name] is a 73 y.o. female admitted on 02/09/2024 with cellulitis. PMH significant for A-fib (on Eliquis PTA), HTN, T2DM, CKD III, HFpEF. Patient presented with worsening of redness and swelling in left lower extremity. Pharmacy has been consulted for cefepime and vancomycin dosing.   Patient remains afebrile, WBC improving 20.2>15.7, renal function is relatively stable/at baseline (Scr 2.1>1.99, BL ~1.6-2.2).   24-hour vancomycin random = 16 @ 1307 on 02/10/24  Plan: Level slightly above goal (goal for cellulitis: 10-15), renal function is relative stable Check vancomycin random level in AM and re-dose vancomycin when level < 15 Monitor renal function, clinical status, culture data, and LOT F/u de-escalating antibiotics as able  Temp (24hrs), Avg:100.5 F (38.1 C), Min:99.2 F (37.3 C), Max:101.4 F (38.6 C)  Recent Labs  Lab 02/08/24 1359 02/09/24 1120 02/09/24 1436 02/09/24 1627 02/10/24 0509 02/10/24 0959 02/10/24 1307  WBC 25.1* 20.2*  --   --  15.7*  --   --   CREATININE 1.84* 2.14*  --   --  1.99*  --   --   LATICACIDVEN  --  2.4* 2.8* 2.5*  --  1.4  --   VANCORANDOM  --   --   --   --   --   --  16    Estimated Creatinine Clearance: 26.4 mL/min (A) (by C-G formula based on SCr of 1.99 mg/dL (H)).    Allergies  Allergen Reactions   Levaquin [Levofloxacin] Other (See Comments)    Partial seizure   Antimicrobials this admission: 4/3 cefepime >>  4/3 vancomycin >>   Dose adjustments this admission: N/A  Microbiology results: 4/3 BCx: NGTD  Thank you for involving pharmacy in this patient's care.   Elizabeth [name], PharmD Clinical Pharmacist 02/10/2024 1:57 PM

## 2024-02-10 NOTE — Assessment & Plan Note (Addendum)
 Patient met severe sepsis criteria with leukocytosis, fever and lactic acidosis.  Likely due to left lower extremity cellulitis.  Enteritis can be contributory. Improving leukocytosis and preliminary blood cultures negative. UA not consistent with UTI. - Continue with vancomycin and cefepime -Continue with supportive care

## 2024-02-10 NOTE — Assessment & Plan Note (Signed)
 Rate controlled.  Not on any rate control medication -Continue with Eliquis

## 2024-02-10 NOTE — Plan of Care (Signed)

## 2024-02-10 NOTE — Assessment & Plan Note (Signed)
 Patient with diarrhea which started improving now.  GI pathogen panel positive for norovirus and enteric genic E. coli. -Continue with supportive care -We can add Zithromax if diarrhea worsened

## 2024-02-10 NOTE — Progress Notes (Signed)
 Progress Note   Patient: Elizabeth [name] GBT:[id number] DOB: 1951-04-27 DOA: 02/09/2024     0 DOS: the patient was seen and examined on 02/10/2024   Brief hospital course: Taken from H&P.   Elizabeth [name] is a 73 y.o. female with medical history significant of chronic A-fib on Eliquis, HTN, chronic HFpEF, chronic lymphedema, IIDM, CKD stage IIIb, diabetic neuropathy, seizure disorder on Dilantin, sent from cardiologist office for evaluation of severe hypokalemia.   Patient was started on Lasix 5 to 6 weeks ago to address worsening of bilateral lymphedema swelling, she also takes KCl supplement at the meantime.  Yesterday she went to see cardiology who performed a blood work in the office and today the blood work showed K= 2.9 and patient instructed to come to ED.   On presentation afebrile with stable vital, blood work showed K2.3, creatinine 2.1 compared to baseline 1.5-2.2, lactic acid 2.4, WBC 20.2.   She was started on cefepime and vancomycin for concern of left lower extremity cellulitis.  Also started on IV Lasix.  4/4: Overnight developed fever, maximum recorded at 101.4 yesterday evening.  100.3 this morning.  Improving leukocytosis with WBC at 15.7 but all cell lines decreased.  Potassium of 2.7, BUN 44, creatinine 1.99, procalcitonin 1.22.  Imaging of left lower extremity with subcutaneous edema consistent with inflammation/infection, no foreign body or bony abnormality. GI pathogen panel positive for enteric genic E. coli and norovirus.  Lactic acidosis resolved.  UA not consistent with UTI  Assessment and Plan: * Severe sepsis [facility]) Patient met severe sepsis criteria with leukocytosis, fever and lactic acidosis.  Likely due to left lower extremity cellulitis.  Enteritis can be contributory. Improving leukocytosis and preliminary blood cultures negative. UA not consistent with UTI. - Continue with vancomycin and cefepime -Continue with supportive care  Gastroenteritis Patient  with diarrhea which started improving now.  GI pathogen panel positive for norovirus and enteric genic E. coli. -Continue with supportive care -We can add Zithromax if diarrhea worsened  Hypokalemia Likely multifactorial with Lasix and GI losses with diarrhea. Potassium at 2.7 with magnesium of 1.9 -Replace potassium and monitor  Acute on chronic heart failure with preserved ejection fraction (HFpEF) (HCC) Patient with worsening lower extremity edema. 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 - Continue home tafamidis  Paroxysmal atrial fibrillation (HCC) Rate controlled.  Not on any rate control medication -Continue with Eliquis  Lymphedema History of chronic lymphedema. - Outpatient follow-up  Diabetes mellitus without complication (HCC) - Continue with SSI  Subjective: Patient was seen and examined today.  Denies any pain.  Diarrhea started improving, she just had one soft bowel movement since morning.  No bleeding or melena  Physical Exam: Vitals:   02/09/24 2237 02/10/24 0457 02/10/24 0746 02/10/24 1457  BP: (!) 97/41 (!) 122/56 105/64 (!) 101/56  Pulse: 64 64 63 66  Resp: 18 16 16 19   Temp: 100 F (37.8 C) 99.2 F (37.3 C) 100.3 F (37.9 C) 100.2 F (37.9 C)  TempSrc: Oral  Oral   SpO2: 100% 96% 97% 97%   General.  Frail and obese elderly lady, in no acute distress. Pulmonary.  Lungs clear bilaterally, normal respiratory effort. CV.  Regular rate and rhythm, no JVD, rub or murmur. Abdomen.  Soft, nontender, nondistended, BS positive. CNS.  Alert and oriented .  No focal neurologic deficit. Extremities.  2+ LE edema more on left with significant  erythema, bilateral lymphedema  Data Reviewed: Prior data reviewed  Family Communication:   Disposition: Status is: Inpatient Remains inpatient appropriate because: Severity of illness  Planned  Discharge Destination: Home with Home Health  DVT prophylaxis.  Eliquis Time spent: 50 minutes  This record has been created using Conservation officer, historic buildings. Errors have been sought and corrected,but may not always be located. Such creation errors do not reflect on the standard of care.   Author: Arnetha Courser, MD 02/10/2024 4:39 PM  For on call review [email or web address].

## 2024-02-10 NOTE — Evaluation (Signed)
 Physical Therapy Evaluation Patient Details Name: Elizabeth [name] MRN: [id number] DOB: 05-27-1951 Today's Date: 02/10/2024  History of Present Illness  73 y/o female presented to ED on 02/09/24 for abnormal potassium from cardiologist as well as SOB and weakness. Admitted with sepsis. PMH: chronic A-fib on Eliquis, HTN, chronic HFpEF, chronic lymphedema, IIDM, CKD stage IIIb, diabetic neuropathy, seizure disorder on Dilantin, CVA with chronic L sided weakness  Clinical Impression  Patient admitted with the above. PTA, patient lives with husband and reports she was independent with no AD, however endorses needing assistance for LB dressing. Patient presents with weakness, impaired balance, and decreased activity tolerance. Ambulatory in room with RW and CGA for safety. Patient demonstrates good awareness of need RW for safety at this time due to weakness. Patient will benefit from skilled PT services during acute stay to address listed deficits. Patient will benefit from ongoing therapy at discharge to maximize functional independence and safety.         If plan is discharge home, recommend the following: A little help with walking and/or transfers;A little help with bathing/dressing/bathroom;Assistance with cooking/housework;Help with stairs or ramp for entrance;Assist for transportation   Can travel by private vehicle        Equipment Recommendations Rolling Ryker Pherigo (2 wheels)  Recommendations for Other Services       Functional Status Assessment Patient has had a recent decline in their functional status and demonstrates the ability to make significant improvements in function in a reasonable and predictable amount of time.     Precautions / Restrictions Precautions Precautions: Fall Recall of Precautions/Restrictions: Intact Restrictions Weight Bearing Restrictions Per Provider Order: No      Mobility  Bed Mobility Overal bed mobility: Needs Assistance Bed Mobility: Supine to  Sit, Sit to Supine     Supine to sit: Contact guard Sit to supine: Contact guard assist   General bed mobility comments: patient using therapist hand as railing to pull up    Transfers Overall transfer level: Needs assistance Equipment used: Rolling Rainer Mounce (2 wheels) Transfers: Sit to/from Stand Sit to Stand: Contact guard assist                Ambulation/Gait Ambulation/Gait assistance: Contact guard assist Gait Distance (Feet): 25 Feet Assistive device: Rolling Samauri Kellenberger (2 wheels) Gait Pattern/deviations: Step-through pattern, Decreased stride length Gait velocity: decreased     General Gait Details: CGA for safety. Slow gait speed  Stairs            Wheelchair Mobility     Tilt Bed    Modified Rankin (Stroke Patients Only)       Balance Overall balance assessment: Needs assistance Sitting-balance support: No upper extremity supported, Feet supported Sitting balance-Leahy Scale: Good     Standing balance support: Bilateral upper extremity supported, Reliant on assistive device for balance Standing balance-Leahy Scale: Fair                               Pertinent Vitals/Pain Pain Assessment Pain Assessment: No/denies pain    Home Living Family/patient expects to be discharged to:: Private residence Living Arrangements: Spouse/significant other Available Help at Discharge: Family;Available 24 hours/day Type of Home: House Home Access: Stairs to enter Entrance Stairs-Rails: Right;Left;Can reach both Entrance Stairs-Number of Steps: 3   Home Layout: One level        Prior Function Prior Level of Function : Needs assist;History of Falls (last six months)  Mobility Comments: reports independence with no AD ADLs Comments: patient initially reporting independent with ADLs. Later in eval, patient states she needs help with lower body dressing     Extremity/Trunk Assessment   Upper Extremity Assessment Upper  Extremity Assessment: Defer to OT evaluation    Lower Extremity Assessment Lower Extremity Assessment: Generalized weakness       Communication   Communication Communication: No apparent difficulties    Cognition Arousal: Alert Behavior During Therapy: WFL for tasks assessed/performed   PT - Cognitive impairments: No apparent impairments                         Following commands: Intact       Cueing       General Comments      Exercises     Assessment/Plan    PT Assessment Patient needs continued PT services  PT Problem List Decreased strength;Decreased activity tolerance;Decreased balance;Decreased mobility;Decreased safety awareness;Decreased knowledge of precautions       PT Treatment Interventions DME instruction;Functional mobility training;Therapeutic activities;Therapeutic exercise;Balance training;Stair training;Gait training;Patient/family education    PT Goals (Current goals can be found in the Care Plan section)  Acute Rehab PT Goals Patient Stated Goal: to get stronger PT Goal Formulation: With patient Time For Goal Achievement: 02/24/24 Potential to Achieve Goals: Good    Frequency Min 1X/week     Co-evaluation               AM-PAC PT "6 Clicks" Mobility  Outcome Measure Help needed turning from your back to your side while in a flat bed without using bedrails?: A Little Help needed moving from lying on your back to sitting on the side of a flat bed without using bedrails?: A Little Help needed moving to and from a bed to a chair (including a wheelchair)?: A Little Help needed standing up from a chair using your arms (e.g., wheelchair or bedside chair)?: A Little Help needed to walk in hospital room?: A Little Help needed climbing 3-5 steps with a railing? : A Little 6 Click Score: 18    End of Session   Activity Tolerance: Patient tolerated treatment well Patient left: in bed;with call bell/phone within reach;with bed  alarm set;with nursing/sitter in room Nurse Communication: Mobility status PT Visit Diagnosis: Unsteadiness on feet (R26.81);Muscle weakness (generalized) (M62.81);Difficulty in walking, not elsewhere classified (R26.2)    Time: 1345-1411 PT Time Calculation (min) (ACUTE ONLY): 26 min   Charges:   PT Evaluation $PT Eval Moderate Complexity: 1 Mod   PT General Charges $$ ACUTE PT VISIT: 1 Visit         Maylon Peppers, PT, DPT Physical Therapist - [facility]  [facility]   Pepper Wyndham A Donicia Druck 02/10/2024, 2:35 PM

## 2024-02-10 NOTE — Assessment & Plan Note (Signed)
 Likely multifactorial with Lasix and GI losses with diarrhea. Potassium at 2.7 with magnesium of 1.9 -Replace potassium and monitor

## 2024-02-10 NOTE — Assessment & Plan Note (Signed)
-  Continue with SSI

## 2024-02-10 NOTE — Evaluation (Signed)
 Occupational Therapy Evaluation Patient Details Name: Elizabeth [name] MRN: [id number] DOB: [birth year] Today's Date: 02/10/2024   History of Present Illness   73 y/o female presented to ED on 02/09/24 for abnormal potassium from cardiologist as well as SOB and weakness. Admitted with sepsis. PMH: chronic A-fib on Eliquis, HTN, chronic HFpEF, chronic lymphedema, IIDM, CKD stage IIIb, diabetic neuropathy, seizure disorder on Dilantin, CVA with chronic L sided weakness   Clinical Impressions Ms Spaziani was seen for OT evaluation this date. Prior to hospital admission, pt was IND. Pt lives with spouse. Pt currently requires CGA + RW for toilet t/f and pericare standing. Minor LOBs, corrects with UE support on counter or RW. MAX A don B socks. Pt would benefit from skilled OT to address noted impairments and functional limitations (see below for any additional details). Upon hospital discharge, recommend OT follow up.   If plan is discharge home, recommend the following:   Help with stairs or ramp for entrance;A little help with bathing/dressing/bathroom     Functional Status Assessment   Patient has had a recent decline in their functional status and demonstrates the ability to make significant improvements in function in a reasonable and predictable amount of time.     Equipment Recommendations   None recommended by OT     Recommendations for Other Services         Precautions/Restrictions   Precautions Precautions: Fall Recall of Precautions/Restrictions: Intact Restrictions Weight Bearing Restrictions Per Provider Order: No     Mobility Bed Mobility Overal bed mobility: Needs Assistance Bed Mobility: Supine to Sit, Sit to Supine     Supine to sit: Supervision Sit to supine: Supervision        Transfers Overall transfer level: Needs assistance Equipment used: Rolling walker (2 wheels) Transfers: Sit to/from Stand Sit to Stand: Contact guard assist                   Balance Overall balance assessment: Needs assistance Sitting-balance support: No upper extremity supported, Feet supported Sitting balance-Leahy Scale: Good     Standing balance support: No upper extremity supported, During functional activity Standing balance-Leahy Scale: Fair                             ADL either performed or assessed with clinical judgement   ADL Overall ADL's : Needs assistance/impaired                                       General ADL Comments: CGA + RW for toilet t/f and pericare standing. Minor LOBs, correects with UE support on counter or RW. MAX A don B socks      Pertinent Vitals/Pain Pain Assessment Pain Assessment: No/denies pain     Extremity/Trunk Assessment Upper Extremity Assessment Upper Extremity Assessment: Overall WFL for tasks assessed   Lower Extremity Assessment Lower Extremity Assessment: Generalized weakness       Communication Communication Communication: No apparent difficulties   Cognition Arousal: Alert Behavior During Therapy: WFL for tasks assessed/performed Cognition: No apparent impairments                               Following commands: Intact       Cueing  General Comments          Exercises  Shoulder Instructions      Home Living Family/patient expects to be discharged to:: Private residence Living Arrangements: Spouse/significant other Available Help at Discharge: Family;Available 24 hours/day Type of Home: House Home Access: Stairs to enter [employer] of Steps: 3 Entrance Stairs-Rails: Right;Left;Can reach both Home Layout: One level     Bathroom Shower/Tub: Tub/shower unit;Sponge bathes at baseline   [employer]: Standard                Prior Functioning/Environment Prior Level of Function : Needs assist;History of Falls (last six months)             Mobility Comments: reports independence with no  AD ADLs Comments: patient initially reporting independent with ADLs. Later in eval, patient states she needs help with lower body dressing    OT Problem List: Decreased strength;Decreased activity tolerance;Decreased range of motion;Impaired balance (sitting and/or standing)   OT Treatment/Interventions: Self-care/ADL training;Therapeutic exercise;Energy conservation;DME and/or AE instruction;Therapeutic activities      OT Goals(Current goals can be found in the care plan section)   Acute Rehab OT Goals Patient Stated Goal: to go home OT Goal Formulation: With patient Time For Goal Achievement: 02/24/24 Potential to Achieve Goals: Good ADL Goals Pt Will Perform Grooming: with modified independence;standing Pt Will Perform Lower Body Dressing: with modified independence;sit to/from stand Pt Will Transfer to Toilet: with modified independence;ambulating;regular height toilet   OT Frequency:  Min 1X/week    Co-evaluation PT/OT/SLP Co-Evaluation/Treatment: Yes Reason for Co-Treatment: To address functional/ADL transfers PT goals addressed during session: Mobility/safety with mobility OT goals addressed during session: ADL's and self-care      AM-PAC OT "6 Clicks" Daily Activity     Outcome Measure Help from another person eating meals?: None Help from another person taking care of personal grooming?: A Little Help from another person toileting, which includes using toliet, bedpan, or urinal?: A Little Help from another person bathing (including washing, rinsing, drying)?: A Lot Help from another person to put on and taking off regular upper body clothing?: A Little Help from another person to put on and taking off regular lower body clothing?: A Lot 6 Click Score: 17   End of Session Equipment Utilized During Treatment: Rolling walker (2 wheels) Nurse Communication: Mobility status  Activity Tolerance: Patient tolerated treatment well Patient left: in bed;with call  bell/phone within reach;with bed alarm set;with nursing/sitter in room  OT Visit Diagnosis: Other abnormalities of gait and mobility (R26.89)                Time: 1345-1410 OT Time Calculation (min): 25 min Charges:  OT General Charges $OT Visit: 1 Visit OT Evaluation $OT Eval Moderate Complexity: 1 Mod OT Treatments $Self Care/Home Management : 8-22 mins  Kathie Dike, M.S. OTR/L  02/10/24, 2:43 PM  ascom [phone]

## 2024-02-10 NOTE — Assessment & Plan Note (Signed)
 History of chronic lymphedema. - Outpatient follow-up

## 2024-02-10 NOTE — Hospital Course (Addendum)
 Taken from H&P.   Elizabeth [name] is a 73 y.o. female with medical history significant of chronic A-fib on Eliquis, HTN, chronic HFpEF, chronic lymphedema, IIDM, CKD stage IIIb, diabetic neuropathy, seizure disorder on Dilantin, sent from cardiologist office for evaluation of severe hypokalemia.   Patient was started on Lasix 5 to 6 weeks ago to address worsening of bilateral lymphedema swelling, she also takes KCl supplement at the meantime.  Yesterday she went to see cardiology who performed a blood work in the office and today the blood work showed K= 2.9 and patient instructed to come to ED.   On presentation afebrile with stable vital, blood work showed K2.3, creatinine 2.1 compared to baseline 1.5-2.2, lactic acid 2.4, WBC 20.2.   She was started on cefepime and vancomycin for concern of left lower extremity cellulitis.  Also started on IV Lasix.  4/4: Overnight developed fever, maximum recorded at 101.4 yesterday evening.  100.3 this morning.  Improving leukocytosis with WBC at 15.7 but all cell lines decreased.  Potassium of 2.7, BUN 44, creatinine 1.99, procalcitonin 1.22.  Imaging of left lower extremity with subcutaneous edema consistent with inflammation/infection, no foreign body or bony abnormality. GI pathogen panel positive for enteric genic E. coli and norovirus.  Lactic acidosis resolved.  UA not consistent with UTI  4/5: Diarrhea resolved, persistent hypokalemia with potassium of 2.3 today-ordered IV and p.o. replacement.  Creatinine continued to improve so we will continue IV diuresis.  Continuing IV antibiotics for cellulitis.  4/6: Hemodynamically stable, persistent hypokalemia with potassium at 2.9 today which is being repleted.  Borderline magnesium so giving some magnesium.  Creatinine continued to improve with diuresis so we will continue.  4/7: Remained hemodynamically stable.  Slight increase in creatinine to 1.54 and hypokalemia resolved.  Holding further IV Lasix  today.  She will resume her home dose of Lasix from tomorrow.  Left lower extremity cellulitis with much improved erythema and edema.  Still having some tenderness.  He received cefepime and vancomycin while in the hospital and is being discharged on Augmentin and doxycycline for 5 more days to complete the course.  PT recommended home health which was ordered  Patient will continue on current medications and need to have a close follow-up with her providers for further recommendations.

## 2024-02-11 DIAGNOSIS — K529 Noninfective gastroenteritis and colitis, unspecified: Secondary | ICD-10-CM | POA: Diagnosis not present

## 2024-02-11 DIAGNOSIS — I5033 Acute on chronic diastolic (congestive) heart failure: Secondary | ICD-10-CM | POA: Diagnosis not present

## 2024-02-11 DIAGNOSIS — A419 Sepsis, unspecified organism: Secondary | ICD-10-CM | POA: Diagnosis not present

## 2024-02-11 DIAGNOSIS — E876 Hypokalemia: Secondary | ICD-10-CM | POA: Diagnosis not present

## 2024-02-11 LAB — GLUCOSE, CAPILLARY
Glucose-Capillary: 115 mg/dL — ABNORMAL HIGH (ref 70–99)
Glucose-Capillary: 148 mg/dL — ABNORMAL HIGH (ref 70–99)
Glucose-Capillary: 106 mg/dL — ABNORMAL HIGH (ref 70–99)
Glucose-Capillary: 86 mg/dL (ref 70–99)

## 2024-02-11 LAB — BASIC METABOLIC PANEL WITH GFR
Anion gap: 14 (ref 5–15)
BUN: 35 mg/dL — ABNORMAL HIGH (ref 8–23)
CO2: 28 mmol/L (ref 22–32)
Calcium: 8.4 mg/dL — ABNORMAL LOW (ref 8.9–10.3)
Chloride: 93 mmol/L — ABNORMAL LOW (ref 98–111)
Creatinine, Ser: 1.59 mg/dL — ABNORMAL HIGH (ref 0.44–1.00)
GFR, Estimated: 34 mL/min — ABNORMAL LOW (ref >60)
Glucose, Bld: 148 mg/dL — ABNORMAL HIGH (ref 70–99)
Potassium: 2.3 mmol/L — CL (ref 3.5–5.1)
Sodium: 135 mmol/L (ref 135–145)

## 2024-02-11 LAB — VANCOMYCIN, RANDOM: Vancomycin Rm: 10 ug/mL

## 2024-02-11 LAB — MRSA NEXT GEN BY PCR, NASAL: MRSA by PCR Next Gen: NOT DETECTED

## 2024-02-11 LAB — CREATININE, SERUM
Creatinine, Ser: 1.62 mg/dL — ABNORMAL HIGH (ref 0.44–1.00)
GFR, Estimated: 34 mL/min — ABNORMAL LOW (ref >60)

## 2024-02-11 LAB — HEMOGLOBIN A1C
Hgb A1c MFr Bld: 6.9 % — ABNORMAL HIGH (ref 4.8–5.6)
Mean Plasma Glucose: 151 mg/dL

## 2024-02-11 LAB — MAGNESIUM: Magnesium: 1.8 mg/dL (ref 1.7–2.4)

## 2024-02-11 MED ORDER — VANCOMYCIN HCL 750 MG/150ML IV SOLN
750.0000 mg | INTRAVENOUS | Status: DC
  Administered 2024-02-11 – 2024-02-12 (×2): 750 mg via INTRAVENOUS
  Filled 2024-02-11 (×3): qty 150

## 2024-02-11 MED ORDER — POTASSIUM CHLORIDE 20 MEQ PO PACK
40.0000 meq | PACK | ORAL | Status: AC
  Administered 2024-02-11 (×3): 40 meq via ORAL
  Filled 2024-02-11 (×3): qty 2

## 2024-02-11 MED ORDER — SODIUM CHLORIDE 0.9 % IV SOLN
2.0000 g | Freq: Two times a day (BID) | INTRAVENOUS | Status: DC
  Administered 2024-02-11 – 2024-02-13 (×5): 2 g via INTRAVENOUS
  Filled 2024-02-11 (×5): qty 12.5

## 2024-02-11 MED ORDER — POTASSIUM CHLORIDE 10 MEQ/100ML IV SOLN
10.0000 meq | INTRAVENOUS | Status: AC
  Administered 2024-02-11 (×6): 10 meq via INTRAVENOUS
  Filled 2024-02-11 (×6): qty 100

## 2024-02-11 NOTE — Assessment & Plan Note (Signed)
 Diarrhea resolved.  GI pathogen panel positive for norovirus and enteric genic E. coli. -Continue with supportive care

## 2024-02-11 NOTE — Assessment & Plan Note (Signed)
 Patient met severe sepsis criteria with leukocytosis, fever and lactic acidosis.  Likely due to left lower extremity cellulitis.  Enteritis can be contributory. Leukocytosis resolved and preliminary blood cultures negative. UA not consistent with UTI. - Continue with vancomycin and cefepime -Continue with supportive care

## 2024-02-11 NOTE — Progress Notes (Signed)
 Progress Note   Patient: Elizabeth [name]:[id number] DOB: [birth year] DOA: 02/09/2024     1 DOS: the patient was seen and examined on 02/11/2024   Brief hospital course: Taken from H&P.   Elizabeth [name] is a 73 y.o. female with medical history significant of chronic A-fib on Eliquis, HTN, chronic HFpEF, chronic lymphedema, IIDM, CKD stage IIIb, diabetic neuropathy, seizure disorder on Dilantin, sent from cardiologist office for evaluation of severe hypokalemia.   Patient was started on Lasix 5 to 6 weeks ago to address worsening of bilateral lymphedema swelling, she also takes KCl supplement at the meantime.  Yesterday she went to see cardiology who performed a blood work in the office and today the blood work showed K= 2.9 and patient instructed to come to ED.   On presentation afebrile with stable vital, blood work showed K2.3, creatinine 2.1 compared to baseline 1.5-2.2, lactic acid 2.4, WBC 20.2.   She was started on cefepime and vancomycin for concern of left lower extremity cellulitis.  Also started on IV Lasix.  4/4: Overnight developed fever, maximum recorded at 101.4 yesterday evening.  100.3 this morning.  Improving leukocytosis with WBC at 15.7 but all cell lines decreased.  Potassium of 2.7, BUN 44, creatinine 1.99, procalcitonin 1.22.  Imaging of left lower extremity with subcutaneous edema consistent with inflammation/infection, no foreign body or bony abnormality. GI pathogen panel positive for enteric genic E. coli and norovirus.  Lactic acidosis resolved.  UA not consistent with UTI  4/5: Diarrhea resolved, persistent hypokalemia with potassium of 2.3 today-ordered IV and p.o. replacement.  Creatinine continued to improve so we will continue IV diuresis.  Continuing IV antibiotics for cellulitis.  Assessment and Plan: * Severe sepsis Elizabeth [name]) Patient met severe sepsis criteria with leukocytosis, fever and lactic acidosis.  Likely due to left lower extremity cellulitis.   Enteritis can be contributory. Improving leukocytosis and preliminary blood cultures negative. UA not consistent with UTI. - Continue with vancomycin and cefepime -Continue with supportive care  Gastroenteritis Diarrhea resolved.  GI pathogen panel positive for norovirus and enteric genic E. coli. -Continue with supportive care  Hypokalemia Likely multifactorial with Lasix and GI losses with diarrhea. Potassium at 2.3 today -Replace potassium and monitor  Acute on chronic heart failure with preserved ejection fraction (HFpEF) (HCC) Patient with worsening lower extremity edema. 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 - Continue home tafamidis  Paroxysmal atrial fibrillation (HCC) Rate controlled.  Not on any rate control medication -Continue with Eliquis  Lymphedema History of chronic lymphedema. - Outpatient follow-up  Diabetes mellitus without complication (HCC) - Continue with SSI  Subjective: Patient was complaining of left lower extremity pain.  Diarrhea has been improved.  Physical Exam: Vitals:   02/10/24 1457 02/10/24 2005 02/11/24 0423 02/11/24 0806  BP: (!) 101/56 124/62 110/60 (!) 120/56  Pulse: 66 81 61 64  Resp: 19 16 16 16   Temp: 100.2 F (37.9 C) 100.2 F (37.9 C) 98.3 F (36.8 C) 98 F (36.7 C)  TempSrc:   Oral Oral  SpO2: 97% 90% 99% 100%   General.  Frail obese lady, in no acute distress. Pulmonary.  Lungs clear bilaterally, normal respiratory effort. CV.  Regular rate and rhythm, no JVD, rub or murmur. Abdomen.  Soft, nontender, nondistended, BS positive. CNS.  Alert and oriented .  No focal neurologic deficit. Extremities.  1+ LE edema,  more on left, left lower extremity with significant erythema and bilateral lymphedema  Data Reviewed: Prior data reviewed  Family Communication: Talked  with husband on phone.  Disposition: Status is: Inpatient Remains inpatient appropriate because: Severity of illness  Planned Discharge Destination: Home with Home Health  DVT prophylaxis.  Eliquis Time spent: 50 minutes  This record has been created using Conservation officer, historic buildings. Errors have been sought and corrected,but may not always be located. Such creation errors do not reflect on the standard of care.   Author: Arnetha Courser, MD 02/11/2024 12:06 PM  For on call review [email or web address].

## 2024-02-11 NOTE — Assessment & Plan Note (Signed)
 History of chronic lymphedema. - Outpatient follow-up

## 2024-02-11 NOTE — Assessment & Plan Note (Signed)
 Likely multifactorial with Lasix and GI losses with diarrhea. Potassium at 2.9 today, borderline magnesium -Replace potassium and magnesium and monitor

## 2024-02-11 NOTE — Assessment & Plan Note (Signed)
 Patient with worsening lower extremity edema. BNP elevated at 465, creatinine continued to improve -Continue with IV Lasix -Daily weight and BMP -Strict intake and output

## 2024-02-11 NOTE — Consult Note (Addendum)
 Pharmacy Antibiotic Note  Elizabeth [name] is a 73 y.o. female admitted on 02/09/2024 with cellulitis. PMH significant for A-fib (on Eliquis PTA), HTN, T2DM, CKD III, HFpEF. Patient presented with worsening of redness and swelling in left lower extremity. Pharmacy has been consulted for cefepime and vancomycin dosing.   Patient remains afebrile, WBC improving 20.2>15.7, renal function is relatively stable/at baseline (Scr 2.1>1.99, BL ~1.6-2.2).     Plan: Vancomycin random 10. Scr back to baseline will start vancomycin 750 mg q24H. Predicted AUC of 471. Goal AUC of 400-600. Obtain vancomycin level after 4th or 5th dose.   Adjust cefepime 2 g q24 to q12H.   Temp (24hrs), Avg:99.2 F (37.3 C), Min:98 F (36.7 C), Max:100.2 F (37.9 C)  Recent Labs  Lab 02/08/24 1359 02/09/24 1120 02/09/24 1436 02/09/24 1627 02/10/24 0509 02/10/24 0959 02/10/24 1307 02/11/24 0527  WBC 25.1* 20.2*  --   --  15.7*  --   --   --   CREATININE 1.84* 2.14*  --   --  1.99*  --   --  1.62*  LATICACIDVEN  --  2.4* 2.8* 2.5*  --  1.4  --   --   VANCORANDOM  --   --   --   --   --   --  16 10    Estimated Creatinine Clearance: 32.5 mL/min (A) (by C-G formula based on SCr of 1.62 mg/dL (H)).    Allergies  Allergen Reactions   Levaquin [Levofloxacin] Other (See Comments)    Partial seizure   Antimicrobials this admission: 4/3 cefepime >>  4/3 vancomycin >>   Dose adjustments this admission: N/A  Microbiology results: 4/3 BCx: NGTD  Thank you for involving pharmacy in this patient's care.   Paschal Dopp, PharmD Clinical Pharmacist 02/11/2024 9:41 AM

## 2024-02-12 DIAGNOSIS — E876 Hypokalemia: Secondary | ICD-10-CM | POA: Diagnosis not present

## 2024-02-12 DIAGNOSIS — K529 Noninfective gastroenteritis and colitis, unspecified: Secondary | ICD-10-CM | POA: Diagnosis not present

## 2024-02-12 DIAGNOSIS — I5033 Acute on chronic diastolic (congestive) heart failure: Secondary | ICD-10-CM | POA: Diagnosis not present

## 2024-02-12 DIAGNOSIS — A419 Sepsis, unspecified organism: Secondary | ICD-10-CM | POA: Diagnosis not present

## 2024-02-12 LAB — BASIC METABOLIC PANEL WITH GFR
Anion gap: 11 (ref 5–15)
BUN: 33 mg/dL — ABNORMAL HIGH (ref 8–23)
CO2: 29 mmol/L (ref 22–32)
Calcium: 8.3 mg/dL — ABNORMAL LOW (ref 8.9–10.3)
Chloride: 96 mmol/L — ABNORMAL LOW (ref 98–111)
Creatinine, Ser: 1.37 mg/dL — ABNORMAL HIGH (ref 0.44–1.00)
GFR, Estimated: 41 mL/min — ABNORMAL LOW (ref >60)
Glucose, Bld: 130 mg/dL — ABNORMAL HIGH (ref 70–99)
Potassium: 2.9 mmol/L — ABNORMAL LOW (ref 3.5–5.1)
Sodium: 136 mmol/L (ref 135–145)

## 2024-02-12 LAB — CBC
HCT: 34.4 % — ABNORMAL LOW (ref 36.0–46.0)
Hemoglobin: 11.6 g/dL — ABNORMAL LOW (ref 12.0–15.0)
MCH: 30.1 pg (ref 26.0–34.0)
MCHC: 33.7 g/dL (ref 30.0–36.0)
MCV: 89.1 fL (ref 80.0–100.0)
Platelets: 197 10*3/uL (ref 150–400)
RBC: 3.86 MIL/uL — ABNORMAL LOW (ref 3.87–5.11)
RDW: 13.2 % (ref 11.5–15.5)
WBC: 8.9 10*3/uL (ref 4.0–10.5)
nRBC: 0 % (ref 0.0–0.2)

## 2024-02-12 LAB — GLUCOSE, CAPILLARY
Glucose-Capillary: 207 mg/dL — ABNORMAL HIGH (ref 70–99)
Glucose-Capillary: 128 mg/dL — ABNORMAL HIGH (ref 70–99)
Glucose-Capillary: 91 mg/dL (ref 70–99)
Glucose-Capillary: 126 mg/dL — ABNORMAL HIGH (ref 70–99)

## 2024-02-12 MED ORDER — MAGNESIUM SULFATE 2 GM/50ML IV SOLN
2.0000 g | Freq: Once | INTRAVENOUS | Status: AC
  Administered 2024-02-12: 2 g via INTRAVENOUS
  Filled 2024-02-12: qty 50

## 2024-02-12 MED ORDER — POTASSIUM CHLORIDE 20 MEQ PO PACK
40.0000 meq | PACK | ORAL | Status: AC
  Administered 2024-02-12 (×4): 40 meq via ORAL
  Filled 2024-02-12 (×4): qty 2

## 2024-02-12 NOTE — Progress Notes (Signed)
 Progress Note   Patient: Elizabeth [name] NWG:[id number] DOB: 06/21/1951 DOA: 02/09/2024     2 DOS: the patient was seen and examined on 02/12/2024   Brief hospital course: Taken from H&P.   Elizabeth [name] is a 73 y.o. female with medical history significant of chronic A-fib on Eliquis, HTN, chronic HFpEF, chronic lymphedema, IIDM, CKD stage IIIb, diabetic neuropathy, seizure disorder on Dilantin, sent from cardiologist office for evaluation of severe hypokalemia.   Patient was started on Lasix 5 to 6 weeks ago to address worsening of bilateral lymphedema swelling, she also takes KCl supplement at the meantime.  Yesterday she went to see cardiology who performed a blood work in the office and today the blood work showed K= 2.9 and patient instructed to come to ED.   On presentation afebrile with stable vital, blood work showed K2.3, creatinine 2.1 compared to baseline 1.5-2.2, lactic acid 2.4, WBC 20.2.   She was started on cefepime and vancomycin for concern of left lower extremity cellulitis.  Also started on IV Lasix.  4/4: Overnight developed fever, maximum recorded at 101.4 yesterday evening.  100.3 this morning.  Improving leukocytosis with WBC at 15.7 but all cell lines decreased.  Potassium of 2.7, BUN 44, creatinine 1.99, procalcitonin 1.22.  Imaging of left lower extremity with subcutaneous edema consistent with inflammation/infection, no foreign body or bony abnormality. GI pathogen panel positive for enteric genic E. coli and norovirus.  Lactic acidosis resolved.  UA not consistent with UTI  4/5: Diarrhea resolved, persistent hypokalemia with potassium of 2.3 today-ordered IV and p.o. replacement.  Creatinine continued to improve so we will continue IV diuresis.  Continuing IV antibiotics for cellulitis.  4/6: Hemodynamically stable, persistent hypokalemia with potassium at 2.9 today which is being repleted.  Borderline magnesium so giving some magnesium.  Creatinine continued to  improve with diuresis so we will continue.  Assessment and Plan: * Severe sepsis [facility]) Patient met severe sepsis criteria with leukocytosis, fever and lactic acidosis.  Likely due to left lower extremity cellulitis.  Enteritis can be contributory. Leukocytosis resolved and preliminary blood cultures negative. UA not consistent with UTI. - Continue with vancomycin and cefepime -Continue with supportive care  Gastroenteritis Diarrhea resolved.  GI pathogen panel positive for norovirus and enteric genic E. coli. -Continue with supportive care  Hypokalemia Likely multifactorial with Lasix and GI losses with diarrhea. Potassium at 2.9 today, borderline magnesium -Replace potassium and magnesium and monitor  Acute on chronic heart failure with preserved ejection fraction (HFpEF) (HCC) Patient with worsening lower extremity edema. 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 - Continue home tafamidis  Paroxysmal atrial fibrillation (HCC) Rate controlled.  Not on any rate control medication -Continue with Eliquis  Lymphedema History of chronic lymphedema. - Outpatient follow-up  Diabetes mellitus without complication (HCC) - Continue with SSI  Subjective: Patient continued to have some left lower leg pain but stating it is slowly improving.  Physical Exam: Vitals:   02/11/24 1541 02/11/24 2112 02/12/24 0310 02/12/24 0805  BP: (!) 118/56 127/75 (!) 107/55 (!) 93/57  Pulse: 60 66 63 (!) 59  Resp: 16 20 16 16   Temp: 98 F (36.7 C) 99.1 F (37.3 C) 98.4 F (36.9 C) 98.5 F (36.9 C)  TempSrc: Oral Oral Oral   SpO2: 98% 96% 98% 96%   General.  Overweight frail elderly lady, in no acute distress.  Pulmonary.  Lungs clear bilaterally, normal respiratory effort. CV.  Regular rate and rhythm, no JVD, rub or murmur. Abdomen.  Soft,  nontender, nondistended, BS positive. CNS.  Alert and oriented .  No focal neurologic deficit. Extremities.  1+ LLE edema with improving erythema, trace right LE edema Psychiatry.  Judgment and insight appears normal.   Data Reviewed: Prior data reviewed  Family Communication: Discussed with husband at bedside  Disposition: Status is: Inpatient Remains inpatient appropriate because: Severity of illness  Planned Discharge Destination: Home with Home Health  DVT prophylaxis.  Eliquis Time spent: 45 minutes  This record has been created using Conservation officer, historic buildings. Errors have been sought and corrected,but may not always be located. Such creation errors do not reflect on the standard of care.   Author: Arnetha Courser, MD 02/12/2024 1:46 PM  For on call review [email or web address].

## 2024-02-12 NOTE — Plan of Care (Signed)
   Problem: Fluid Volume: Goal: Ability to maintain a balanced intake and output will improve Outcome: Progressing   Problem: Health Behavior/Discharge Planning: Goal: Ability to identify and utilize available resources and services will improve Outcome: Progressing

## 2024-02-13 ENCOUNTER — Other Ambulatory Visit: Payer: Self-pay

## 2024-02-13 DIAGNOSIS — L03116 Cellulitis of left lower limb: Secondary | ICD-10-CM | POA: Diagnosis not present

## 2024-02-13 DIAGNOSIS — E876 Hypokalemia: Secondary | ICD-10-CM | POA: Diagnosis not present

## 2024-02-13 DIAGNOSIS — K529 Noninfective gastroenteritis and colitis, unspecified: Secondary | ICD-10-CM | POA: Diagnosis not present

## 2024-02-13 DIAGNOSIS — A419 Sepsis, unspecified organism: Secondary | ICD-10-CM | POA: Diagnosis not present

## 2024-02-13 LAB — GLUCOSE, CAPILLARY
Glucose-Capillary: 133 mg/dL — ABNORMAL HIGH (ref 70–99)
Glucose-Capillary: 138 mg/dL — ABNORMAL HIGH (ref 70–99)

## 2024-02-13 LAB — BASIC METABOLIC PANEL WITH GFR
Anion gap: 9 (ref 5–15)
BUN: 35 mg/dL — ABNORMAL HIGH (ref 8–23)
CO2: 28 mmol/L (ref 22–32)
Calcium: 8.2 mg/dL — ABNORMAL LOW (ref 8.9–10.3)
Chloride: 96 mmol/L — ABNORMAL LOW (ref 98–111)
Creatinine, Ser: 1.54 mg/dL — ABNORMAL HIGH (ref 0.44–1.00)
GFR, Estimated: 36 mL/min — ABNORMAL LOW (ref >60)
Glucose, Bld: 140 mg/dL — ABNORMAL HIGH (ref 70–99)
Potassium: 3.7 mmol/L (ref 3.5–5.1)
Sodium: 133 mmol/L — ABNORMAL LOW (ref 135–145)

## 2024-02-13 MED ORDER — DOXYCYCLINE HYCLATE 100 MG PO TABS
100.0000 mg | ORAL_TABLET | Freq: Two times a day (BID) | ORAL | 0 refills | Status: AC
  Filled 2024-02-13: qty 14, 7d supply, fill #0

## 2024-02-13 MED ORDER — AMOXICILLIN-POT CLAVULANATE 875-125 MG PO TABS
1.0000 | ORAL_TABLET | Freq: Two times a day (BID) | ORAL | 0 refills | Status: AC
  Filled 2024-02-13: qty 10, 5d supply, fill #0

## 2024-02-13 MED ORDER — NYSTATIN 100000 UNIT/GM EX POWD
Freq: Two times a day (BID) | CUTANEOUS | 0 refills | Status: DC
  Filled 2024-02-13: qty 15, 7d supply, fill #0

## 2024-02-13 MED ORDER — PROBIOTIC 1-250 BILLION-MG PO CAPS
2.0000 | ORAL_CAPSULE | Freq: Three times a day (TID) | ORAL | 0 refills | Status: DC
  Filled 2024-02-13: qty 30, 5d supply, fill #0

## 2024-02-13 NOTE — Care Management Important Message (Signed)
 Important Message  Patient Details  Name: Elizabeth [name] MRN: [id number] Date of Birth: [birth year]   Important Message Given:  Yes - Medicare IM     Cristela Blue, CMA 02/13/2024, 10:32 AM

## 2024-02-13 NOTE — Discharge Summary (Signed)
 Physician Discharge Summary   Patient: Elizabeth [name] MRN: [id number] DOB: 1951/11/08  Admit date:     02/09/2024  Discharge date: 02/13/24  Discharge Physician: Elizabeth [name]   PCP: Elizabeth Sato, MD   Recommendations at discharge:  Please obtain CBC and BMP on follow-up Follow-up with primary care provider Follow-up with her cardiologist  Discharge Diagnoses: Principal Problem:   Severe sepsis [facility]) Active Problems:   Hypokalemia   Gastroenteritis   Acute on chronic heart failure with preserved ejection fraction (HFpEF) (HCC)   Cardiac amyloidosis (HCC)   Paroxysmal atrial fibrillation (HCC)   Diabetes mellitus without complication (HCC)   Lymphedema   Cellulitis   Hospital Course: Taken from H&P.   Elizabeth [name] is a 73 y.o. female with medical history significant of chronic A-fib on Eliquis, HTN, chronic HFpEF, chronic lymphedema, IIDM, CKD stage IIIb, diabetic neuropathy, seizure disorder on Dilantin, sent from cardiologist office for evaluation of severe hypokalemia.   Patient was started on Lasix 5 to 6 weeks ago to address worsening of bilateral lymphedema swelling, she also takes KCl supplement at the meantime.  Yesterday she went to see cardiology who performed a blood work in the office and today the blood work showed K= 2.9 and patient instructed to come to ED.   On presentation afebrile with stable vital, blood work showed K2.3, creatinine 2.1 compared to baseline 1.5-2.2, lactic acid 2.4, WBC 20.2.   She was started on cefepime and vancomycin for concern of left lower extremity cellulitis.  Also started on IV Lasix.  4/4: Overnight developed fever, maximum recorded at 101.4 yesterday evening.  100.3 this morning.  Improving leukocytosis with WBC at 15.7 but all cell lines decreased.  Potassium of 2.7, BUN 44, creatinine 1.99, procalcitonin 1.22.  Imaging of left lower extremity with subcutaneous edema consistent with inflammation/infection, no foreign body or  bony abnormality. GI pathogen panel positive for enteric genic E. coli and norovirus.  Lactic acidosis resolved.  UA not consistent with UTI  4/5: Diarrhea resolved, persistent hypokalemia with potassium of 2.3 today-ordered IV and p.o. replacement.  Creatinine continued to improve so we will continue IV diuresis.  Continuing IV antibiotics for cellulitis.  4/6: Hemodynamically stable, persistent hypokalemia with potassium at 2.9 today which is being repleted.  Borderline magnesium so giving some magnesium.  Creatinine continued to improve with diuresis so we will continue.  4/7: Remained hemodynamically stable.  Slight increase in creatinine to 1.54 and hypokalemia resolved.  Holding further IV Lasix today.  She will resume her home dose of Lasix from tomorrow.  Left lower extremity cellulitis with much improved erythema and edema.  Still having some tenderness.  He received cefepime and vancomycin while in the hospital and is being discharged on Augmentin and doxycycline for 5 more days to complete the course.  PT recommended home health which was ordered  Patient will continue on current medications and need to have a close follow-up with her providers for further recommendations.  Assessment and Plan: * Severe sepsis [facility]) Patient met severe sepsis criteria with leukocytosis, fever and lactic acidosis.  Likely due to left lower extremity cellulitis.  Enteritis can be contributory. Leukocytosis resolved and preliminary blood cultures negative. UA not consistent with UTI.  LLE erythema and edema with significant improvement - Continue with vancomycin and cefepime while in the hospital and she is being discharged on Augmentin and doxycycline for 5 more days. -Continue with supportive care  Gastroenteritis Diarrhea resolved.  GI pathogen panel positive for norovirus  and enteric genic E. coli. -Continue with supportive care  Hypokalemia.  Resolved Likely multifactorial with Lasix and GI  losses with diarrhea. Potassium at 3.7 today,  - She will continue home supplement while taking Lasix.  Acute on chronic heart failure with preserved ejection fraction (HFpEF) (HCC) Patient with worsening lower extremity edema. BNP elevated at 465, creatinine continued to improve She was diuresed through IV Lasix while in the hospital and will resume home Lasix on discharge  Cardiac amyloidosis [facility]) Recently diagnosed cardiac amyloidosis. - Continue home tafamidis  Paroxysmal atrial fibrillation (HCC) Rate controlled.  Not on any rate control medication -Continue with Eliquis  Lymphedema History of chronic lymphedema. - Outpatient follow-up  Diabetes mellitus without complication (HCC) - Continue with SSI - Resume home meds on discharge   Consultants: None Procedures performed: None Disposition: Home health Diet recommendation:  Discharge Diet Orders (From admission, onward)     Start     Ordered   02/13/24 0000  Diet - low sodium heart healthy        02/13/24 0935           Cardiac and Carb modified diet DISCHARGE MEDICATION: Allergies as of 02/13/2024       Reactions   Levaquin [levofloxacin] Other (See Comments)   Partial seizure        Medication List     STOP taking these medications    gabapentin 300 MG capsule Commonly known as: NEURONTIN   mupirocin ointment 2 % Commonly known as: BACTROBAN       TAKE these medications    acetaminophen 325 MG tablet Commonly known as: TYLENOL Take 2 tablets (650 mg total) by mouth every 6 (six) hours as needed for mild pain (or Fever >/= 101).   acidophilus Caps capsule Take 2 capsules by mouth 3 (three) times daily.   amoxicillin-clavulanate 875-125 MG tablet Commonly known as: AUGMENTIN Take 1 tablet by mouth 2 (two) times daily for 5 days.   apixaban 5 MG Tabs tablet Commonly known as: ELIQUIS Take 1 tablet (5 mg total) by mouth 2 (two) times daily.   CALCIUM 1000 + D PO Take 1 tablet by  mouth daily.   clopidogrel 75 MG tablet Commonly known as: PLAVIX Take 75 mg by mouth daily.   dapagliflozin propanediol 10 MG Tabs tablet Commonly known as: FARXIGA Take 1 tablet (10 mg total) by mouth daily.   doxycycline 100 MG tablet Commonly known as: VIBRA-TABS Take 1 tablet (100 mg total) by mouth 2 (two) times daily for 7 days.   escitalopram 10 MG tablet Commonly known as: LEXAPRO Take 10 mg by mouth daily.   furosemide 40 MG tablet Commonly known as: LASIX Take 1 tablet (40 mg total) by mouth 2 (two) times daily.   nystatin powder Commonly known as: MYCOSTATIN/NYSTOP Apply topically 2 (two) times daily.   potassium chloride 10 MEQ tablet Commonly known as: KLOR-CON Take 2 tablets (20 mEq total) by mouth daily.   pravastatin 40 MG tablet Commonly known as: PRAVACHOL Take 1 tablet (40 mg total) by mouth daily. In afternoon   Tafamidis Meglumine (Cardiac) 20 MG Caps Take 4 capsules (80 mg total) by mouth daily.   True Metrix Blood Glucose Test test strip Generic drug: glucose blood   TRUEplus Lancets 33G Misc        Discharge Exam: There were no vitals filed for this visit. General.  Frail elderly lady, in no acute distress. Pulmonary.  Lungs clear bilaterally, normal respiratory effort. CV.  Regular rate and rhythm, no JVD, rub or murmur. Abdomen.  Soft, nontender, nondistended, BS positive. CNS.  Alert and oriented .  No focal neurologic deficit. Extremities.  No edema on right, trace erythema and mild erythema on left lower extremity  Condition at discharge: stable  The results of significant diagnostics from this hospitalization (including imaging, microbiology, ancillary and laboratory) are listed below for reference.   Imaging Studies: DG Foot 2 Views Left Result Date: 02/09/2024 CLINICAL DATA:  Left foot cellulitis. EXAM: LEFT FOOT - 2 VIEW COMPARISON:  None Available. 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 Vascular calcifications are noted. Moderate posterior calcaneal spurring is noted. No acute fracture or lytic destruction is noted. Mild dorsal soft tissue swelling is noted suggesting infection. IMPRESSION: Postsurgical changes as noted above. No definite fracture or acute bony abnormality is noted. Dorsal soft tissue swelling suggesting infection. Electronically Signed   By: Lupita Raider M.D.   On: 02/09/2024 17:09   DG Tibia/Fibula Left Result Date: 02/09/2024 CLINICAL DATA:  Cellulitis EXAM: LEFT TIBIA AND FIBULA - 2 VIEW COMPARISON:  None Available. FINDINGS: Subcutaneous edema noted. No organized collection. Vascular calcifications are noted in the lower extremity. No osseous abnormality. IMPRESSION: Subcutaneous edema without organized collection. Electronically Signed   By: Genevive Bi M.D.   On: 02/09/2024 17:08   DG Chest 2 View Result Date: 02/09/2024 CLINICAL DATA:  Sepsis, weakness. EXAM: CHEST - 2 VIEW COMPARISON:  July 02, 2023. FINDINGS: The heart size and mediastinal contours are within normal limits. Both lungs are clear. The visualized skeletal structures are unremarkable. IMPRESSION: No active cardiopulmonary disease. Electronically Signed   By: Lupita Raider M.D.   On: 02/09/2024 12:43    Microbiology: Results for orders placed or performed during the hospital encounter of 02/09/24  Culture, blood (Routine x 2)     Status: None (Preliminary result)   Collection Time: 02/09/24 11:20 AM   Specimen: BLOOD  Result Value Ref Range Status   Specimen Description BLOOD RIGHT ANTECUBITAL  Final   Special Requests   Final    BOTTLES DRAWN AEROBIC AND ANAEROBIC Blood Culture results may not be optimal due to an inadequate volume of blood received in culture bottles   Culture   Final    NO GROWTH 4 DAYS Performed at [facility], [address], [place], [place] [address]    Report Status PENDING  Incomplete  Resp panel by RT-PCR (RSV, Flu A&B, Covid)  Anterior Nasal Swab     Status: None   Collection Time: 02/09/24  1:56 PM   Specimen: Anterior Nasal Swab  Result Value Ref Range Status   SARS Coronavirus 2 by RT PCR NEGATIVE NEGATIVE Final    Comment: (NOTE) SARS-CoV-2 target nucleic acids are NOT DETECTED.  The SARS-CoV-2 RNA is generally detectable in upper respiratory specimens during the acute phase of infection. The lowest concentration of SARS-CoV-2 viral copies this assay can detect is 138 copies/mL. A negative result does not preclude SARS-Cov-2 infection and should not be used as the sole basis for treatment or other patient management decisions. A negative result may occur with  improper specimen collection/handling, submission of specimen other than nasopharyngeal swab, presence of viral mutation(s) within the areas targeted by this assay, and inadequate number of viral copies(<138 copies/mL). A negative result must be combined with clinical observations, patient history, and epidemiological information. The expected result is Negative.  Fact Sheet for Patients:  BloggerCourse.com  Fact Sheet  for Healthcare Providers:  SeriousBroker.it  This test is no t yet approved or cleared by the [place] and  has been authorized for detection and/or diagnosis of SARS-CoV-2 by FDA under an Emergency Use Authorization (EUA). This EUA will remain  in effect (meaning this test can be used) for the duration of the COVID-19 declaration under Section 564(b)(1) of the Act, 21 U.S.C.section 360bbb-3(b)(1), unless the authorization is terminated  or revoked sooner.       Influenza A by PCR NEGATIVE NEGATIVE Final   Influenza B by PCR NEGATIVE NEGATIVE Final    Comment: (NOTE) The Xpert Xpress SARS-CoV-2/FLU/RSV plus assay is intended as an aid in the diagnosis of influenza from Nasopharyngeal swab specimens and should not be used as a sole basis for treatment. Nasal washings  and aspirates are unacceptable for Xpert Xpress SARS-CoV-2/FLU/RSV testing.  Fact Sheet for Patients: BloggerCourse.com  Fact Sheet for Healthcare Providers: SeriousBroker.it  This test is not yet approved or cleared by the [place] FDA and has been authorized for detection and/or diagnosis of SARS-CoV-2 by FDA under an Emergency Use Authorization (EUA). This EUA will remain in effect (meaning this test can be used) for the duration of the COVID-19 declaration under Section 564(b)(1) of the Act, 21 U.S.C. section 360bbb-3(b)(1), unless the authorization is terminated or revoked.     Resp Syncytial Virus by PCR NEGATIVE NEGATIVE Final    Comment: (NOTE) Fact Sheet for Patients: BloggerCourse.com  Fact Sheet for Healthcare Providers: SeriousBroker.it  This test is not yet approved or cleared by the [place] FDA and has been authorized for detection and/or diagnosis of SARS-CoV-2 by FDA under an Emergency Use Authorization (EUA). This EUA will remain in effect (meaning this test can be used) for the duration of the COVID-19 declaration under Section 564(b)(1) of the Act, 21 U.S.C. section 360bbb-3(b)(1), unless the authorization is terminated or revoked.  Performed at [facility], [address] [place], [place] [address]   Culture, blood (Routine x 2)     Status: None (Preliminary result)   Collection Time: 02/09/24  2:36 PM   Specimen: BLOOD  Result Value Ref Range Status   Specimen Description BLOOD BLOOD RIGHT ARM  Final   Special Requests   Final    BOTTLES DRAWN AEROBIC AND ANAEROBIC Blood Culture results may not be optimal due to an inadequate volume of blood received in culture bottles   Culture   Final    NO GROWTH 4 DAYS Performed at [facility], [address] [place], [place] [address]    Report Status PENDING  Incomplete   Gastrointestinal Panel by PCR , Stool     Status: Abnormal   Collection Time: 02/09/24  5:22 PM   Specimen: Stool  Result Value Ref Range Status   Campylobacter species NOT DETECTED NOT DETECTED Final   Plesimonas shigelloides NOT DETECTED NOT DETECTED Final   Salmonella species NOT DETECTED NOT DETECTED Final   Yersinia enterocolitica NOT DETECTED NOT DETECTED Final   Vibrio species NOT DETECTED NOT DETECTED Final   Vibrio cholerae NOT DETECTED NOT DETECTED Final   Enteroaggregative E coli (EAEC) DETECTED (A) NOT DETECTED Final    Comment: RESULT CALLED TO, READ BACK BY AND VERIFIED WITH: LISA GEISLER @1907  ON 02/09/24 SKL    Enteropathogenic E coli (EPEC) NOT DETECTED NOT DETECTED Final   Enterotoxigenic E coli (ETEC) NOT DETECTED NOT DETECTED Final   Shiga like toxin producing E coli (STEC) NOT DETECTED NOT DETECTED Final  Shigella/Enteroinvasive E coli (EIEC) NOT DETECTED NOT DETECTED Final   Cryptosporidium NOT DETECTED NOT DETECTED Final   Cyclospora cayetanensis NOT DETECTED NOT DETECTED Final   Entamoeba histolytica NOT DETECTED NOT DETECTED Final   Giardia lamblia NOT DETECTED NOT DETECTED Final   Adenovirus F40/41 NOT DETECTED NOT DETECTED Final   Astrovirus NOT DETECTED NOT DETECTED Final   Norovirus GI/GII DETECTED (A) NOT DETECTED Final    Comment: RESULT CALLED TO, READ BACK BY AND VERIFIED WITH: LISA GEISLER @1907  ON 02/09/24 SKL    Rotavirus A NOT DETECTED NOT DETECTED Final   Sapovirus (I, II, IV, and V) NOT DETECTED NOT DETECTED Final    Comment: Performed at [facility], [address] [place], [place] [address]  MRSA Next Gen by PCR, Nasal     Status: None   Collection Time: 02/11/24  5:07 AM   Specimen: Nasal Mucosa; Nasal Swab  Result Value Ref Range Status   MRSA by PCR Next Gen NOT DETECTED NOT DETECTED Final    Comment: (NOTE) The GeneXpert MRSA Assay (FDA approved for NASAL specimens only), is one component of a comprehensive MRSA  colonization surveillance program. It is not intended to diagnose MRSA infection nor to guide or monitor treatment for MRSA infections. Test performance is not FDA approved in patients less than 68 years old. Performed at [facility], [address] [place], [place] [address]     Labs: CBC: Recent Labs  Lab 02/08/24 1359 02/09/24 1120 02/10/24 0509 02/12/24 0533  WBC 25.1* 20.2* 15.7* 8.9  NEUTROABS  --  18.9*  --   --   HGB 13.2 13.2 11.6* 11.6*  HCT 40.4 39.0 34.4* 34.4*  MCV 93 91.1 89.8 89.1  PLT 280 245 185 197   Basic Metabolic Panel: Recent Labs  Lab 02/09/24 0855 02/09/24 1120 02/09/24 2250 02/10/24 0509 02/11/24 0527 02/11/24 1100 02/12/24 0533 02/13/24 0506  NA  --  134*  --  139  --  135 136 133*  K  --  2.3* 2.4* 2.7*  --  2.3* 2.9* 3.7  CL  --  91*  --  101  --  93* 96* 96*  CO2  --  26  --  27  --  28 29 28   GLUCOSE  --  248*  --  147*  --  148* 130* 140*  BUN  --  44*  --  44*  --  35* 33* 35*  CREATININE  --  2.14*  --  1.99* 1.62* 1.59* 1.37* 1.54*  CALCIUM  --  8.8*  --  8.4*  --  8.4* 8.3* 8.2*  MG  --  1.9  --   --  1.8  --   --   --   PHOS 4.3  --   --   --   --   --   --   --    Liver Function Tests: Recent Labs  Lab 02/09/24 1120  AST 25  ALT 18  ALKPHOS 75  BILITOT 1.6*  PROT 7.3  ALBUMIN 3.4*   CBG: Recent Labs  Lab 02/12/24 0807 02/12/24 1152 02/12/24 1656 02/12/24 2122 02/13/24 0824  GLUCAP 207* 128* 91 126* 133*    Discharge time spent: greater than 30 minutes.  This record has been created using Conservation officer, historic buildings. Errors have been sought and corrected,but may not always be located. Such creation errors do not reflect on the standard of care.   Signed: Arnetha Courser, MD [facility] 02/13/2024

## 2024-02-13 NOTE — Plan of Care (Signed)
   Problem: Nutritional: Goal: Maintenance of adequate nutrition will improve Outcome: Progressing

## 2024-02-13 NOTE — Plan of Care (Signed)

## 2024-02-13 NOTE — TOC Progression Note (Signed)
 Transition of Care [facility]) - Progression Note    Patient Details  Name: Elizabeth [name] MRN: [id number] Date of Birth: 07/09/1951  [facility] of [facility]) CM/SW Contact  Marlowe Sax, RN Phone Number: 02/13/2024, 9:47 AM  Clinical Narrative:    The patient reports that she has a rolling walker at home, She is agreeable to [facility] and does not have a preference, [facility] accepted the patient for [facility] She does not need anything else, her husband will transport   Expected Discharge Plan: Home w Home Health Services Barriers to Discharge: No Barriers Identified  Expected Discharge Plan and Services   Discharge Planning Services: CM Consult Post Acute Care Choice: Home Health Living arrangements for the past 2 months: Single Family Home Expected Discharge Date: 02/13/24               DME Arranged: N/A DME Agency: NA       HH Arranged: PT, OT           Social Determinants of Health (SDOH) Interventions SDOH Screenings   Food Insecurity: No Food Insecurity (02/09/2024)  Housing: Low Risk  (02/09/2024)  Transportation Needs: No Transportation Needs (02/09/2024)  Utilities: Not At Risk (02/09/2024)  Social Connections: Moderately Integrated (02/09/2024)  Tobacco Use: Low Risk  (02/09/2024)    Readmission Risk Interventions    10/04/2023    1:13 PM  Readmission Risk Prevention Plan  PCP or Specialist Appt within 3-5 Days Complete  Social Work Consult for Recovery Care Planning/Counseling Complete  Palliative Care Screening Not Applicable

## 2024-02-13 NOTE — Discharge Instructions (Signed)
 [facility] [address]  [place], [place]  [phone] Open 24 hours  They will call you to set up when they can come for assessment

## 2024-02-14 LAB — CULTURE, BLOOD (ROUTINE X 2)
Culture: NO GROWTH
Culture: NO GROWTH

## 2024-03-06 NOTE — Progress Notes (Signed)
 Electrophysiology Clinic Note    Date:  03/08/2024  Patient ID:  Elizabeth [name], Elizabeth [name] December 22, 1950, MRN [id number] PCP:  Macie Saxon, MD  Cardiologist:  Constancia Delton, MD HF cardiologist: Alease Amend Electrophysiologist: Boyce Byes, MD    Discussed the use of AI scribe software for clinical note transcription with the patient, who gave verbal consent to proceed.   Patient Profile    Chief Complaint: Afib w slow VR follow-up  History of Present Illness: Elizabeth [name] is a 73 y.o. female with PMH notable for diastolic HF d/t cardiac amyloid, afib w slow ventricular rate, HTN, CKD -3b, CVA, T2DM, PVD s/p partial R foot amputation and partial amputation of L 5th toe, breast cancer ; seen today for Boyce Byes, MD for routine electrophysiology followup.   She last saw Dr. Marven Slimmer 08/2023 for EP evaluation of Afib w slow VR and syncope. At that visit, she was profoundly fluid overloaded with bilateral lower extremity weeping. Low voltage QRS with normal LVEF, so was concerned for cardiac amyloid. He recommended PPM followed by DCCV once euvolemic.  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  She established with HF cardiology who have slowly titrated GDMT, last visit 3/18. She was admitted to [facility] 4/3-05/2024 with cellulitis of LLE, hypoK, and e. Coli.   On follow-up today, her L lower leg is significantly improved, she finished her PO course of abx. She has occasional chest discomfort and DOE. No recent syncopal episodes. Uses walker to ambulate. She sleeps propped on pillows for comfort. No cough or SOB at rest. Her legs are no longer weeping, but remains very swollen. She is taking lasix  40mg  BID. She denies palpitations. She takes eliquis  BID, no bleeding concerns.   Arrhythmia/Device History No specialty comments available.     ROS:  Please see the history of present illness. All other systems are reviewed and otherwise negative.     Physical Exam    VS:  BP 128/84 (BP Location: Right Arm, Patient Position: Sitting, Cuff Size: Normal)   Pulse (!) 48   Ht 5\' 3"  (1.6 m)   Wt 186 lb 3.2 oz (84.5 kg)   SpO2 94%   BMI 32.98 kg/m  BMI: Body mass index is 32.98 kg/m.  Wt Readings from Last 3 Encounters:  03/07/24 186 lb 3.2 oz (84.5 kg)  02/08/24 187 lb 12.8 oz (85.2 kg)  01/24/24 184 lb 4 oz (83.6 kg)     GEN- The patient is well appearing, alert and oriented x 3 today.   Lungs- Clear to ausculation bilaterally, normal work of breathing.  Heart- Irregularly irregular rate and rhythm, no murmurs, rubs or gallops Extremities- 2-3+ peripheral edema, bilateral lower legs remain dressed     Studies Reviewed   Previous EP, cardiology notes.    EKG is ordered. Personal review of EKG from today shows:    EKG Interpretation Date/Time:  Wednesday March 07 2024 10:08:20 EDT Ventricular Rate:  48 PR Interval:    QRS Duration:  90 QT Interval:  468 QTC Calculation: 418 R Axis:   -20  Text Interpretation: Atrial fibrillation with slow ventricular response Low voltage QRS Incomplete left bundle branch block Confirmed by Daijha Leggio [phone]) on 03/07/2024 10:28:02 AM     Myocardial Amyloid imaging, 09/08/2023   Myocardial uptake was positive for radiotracer uptake. The visual grade of myocardial uptake relative to the ribs was Grade 2 (Myocardial uptake equal to rib uptake).   Findings  are suggestive (Grade 2 or 3) of cardiac ATTR amyloidosis.   Prior study not available for comparison.  Long term monitor, 08/24/2023 Atrial Fibrillation occurred continuously (100% burden), ranging from 28-86 bpm (avg of 50 bpm). 63 Pauses occurred, the longest lasting 3.7 secs (16 bpm). Isolated VEs were rare (<1.0%), and no VE Couplets or VE Triplets were present. MD notification criteria for Slow Atrial Fibrillation met - report posted prior to notification per account request ([place]).  TTE, 07/03/2023  1. Left ventricular  ejection fraction, by estimation, is 55 to 60%. The left ventricle has normal function. The left ventricle has no regional wall motion abnormalities. Left ventricular diastolic parameters are indeterminate.   2. Right ventricular systolic function is normal. The right ventricular size is mildly enlarged.   3. The mitral valve is normal in structure. Mild mitral valve regurgitation.   4. The aortic valve is tricuspid. Aortic valve regurgitation is not visualized.   5. The inferior vena cava is normal in size with <50% respiratory variability, suggesting right atrial pressure of 8 mmHg.   Comparison(s): The left ventricular function is unchanged.    Assessment and Plan     #) persis Afib w slow ventricular response #) cardiac amyloid #) lower extremity edema #) recent cellulitis  severe sepsis #) syncope Previously evaluated by Dr. Marven Slimmer, who recommended PPM prior to working to regain normal rhythm. Patient continues to have significant lower extremity edema and recent sepsis, cellulitis to LLL and e.coli. Blood cx negative.  Patient has had no further syncopal episodes Discussed with Dr. Marven Slimmer, who recommended ongoing diuresis prior to consideration of PPM.  Increase lasix  to 60mg  BID She has follow-up scheduled with HF (Dr. Alease Amend) in ~6 weeks Continue eliquis  5mg  BID for CHA2DS2-VASc Score = at least 8          Current medicines are reviewed at length with the patient today.   The patient does not have concerns regarding her medicines.  The following changes were made today:   INCREASE lasix  to 60mg  BID  Labs/ tests ordered today include:  Orders Placed This Encounter  Procedures   EKG 12-Lead     Disposition: Follow up with Dr. Marven Slimmer or EP APP in 3 months   Signed, Adaline Holly, NP  03/08/24  7:53 AM  Electrophysiology [facility]

## 2024-03-07 ENCOUNTER — Ambulatory Visit: Attending: Cardiology | Admitting: Cardiology

## 2024-03-07 VITALS — BP 128/84 | HR 48 | Ht 63.0 in | Wt 186.2 lb

## 2024-03-07 DIAGNOSIS — I4819 Other persistent atrial fibrillation: Secondary | ICD-10-CM | POA: Diagnosis not present

## 2024-03-07 DIAGNOSIS — E854 Organ-limited amyloidosis: Secondary | ICD-10-CM | POA: Diagnosis not present

## 2024-03-07 DIAGNOSIS — D6869 Other thrombophilia: Secondary | ICD-10-CM | POA: Diagnosis not present

## 2024-03-07 DIAGNOSIS — R600 Localized edema: Secondary | ICD-10-CM

## 2024-03-07 DIAGNOSIS — R55 Syncope and collapse: Secondary | ICD-10-CM

## 2024-03-07 DIAGNOSIS — R001 Bradycardia, unspecified: Secondary | ICD-10-CM

## 2024-03-07 DIAGNOSIS — I43 Cardiomyopathy in diseases classified elsewhere: Secondary | ICD-10-CM

## 2024-03-07 MED ORDER — FUROSEMIDE 40 MG PO TABS: 60.0000 mg | ORAL_TABLET | Freq: Two times a day (BID) | ORAL | 3 refills | Status: DC

## 2024-03-07 NOTE — Patient Instructions (Signed)
 Medication Instructions:  Your physician recommends the following medication changes.  INCREASE: Lasix  to 60 mg (1.5 tablets) 2 times daily  *If you need a refill on your cardiac medications before your next appointment, please call your pharmacy*  LFollow-Up:  Keep your appointment with the Heart Failure Clinic  At [facility], you and your health needs are our priority.  As part of our continuing mission to provide you with exceptional heart care, our providers are all part of one team.  This team includes your primary Cardiologist (physician) and Advanced Practice Providers or APPs (Physician Assistants and Nurse Practitioners) who all work together to provide you with the care you need, when you need it.  Your next appointment:   3 month(s)  Provider:   Harvie Liner, MD or Suzann Riddle, NP    We recommend signing up for the patient portal called "MyChart".  Sign up information is provided on this After Visit Summary.  MyChart is used to connect with patients for Virtual Visits (Telemedicine).  Patients are able to view lab/test results, encounter notes, upcoming appointments, etc.  Non-urgent messages can be sent to your provider as well.   To learn more about what you can do with MyChart, go to [email or web address].   Other Instructions Please call the Heart Failure Clinic if the swelling of the lower extremities does not improve.   Call the Heart Center if you have any episode of syncope or near syncope.

## 2024-03-13 ENCOUNTER — Encounter (INDEPENDENT_AMBULATORY_CARE_PROVIDER_SITE_OTHER): Payer: Self-pay | Admitting: Nurse Practitioner

## 2024-03-13 ENCOUNTER — Ambulatory Visit (INDEPENDENT_AMBULATORY_CARE_PROVIDER_SITE_OTHER): Payer: Self-pay | Admitting: Nurse Practitioner

## 2024-03-13 VITALS — BP 125/79 | HR 75 | Resp 18

## 2024-03-13 DIAGNOSIS — I890 Lymphedema, not elsewhere classified: Secondary | ICD-10-CM

## 2024-03-13 DIAGNOSIS — E782 Mixed hyperlipidemia: Secondary | ICD-10-CM

## 2024-03-13 DIAGNOSIS — I10 Essential (primary) hypertension: Secondary | ICD-10-CM | POA: Diagnosis not present

## 2024-03-14 ENCOUNTER — Telehealth (INDEPENDENT_AMBULATORY_CARE_PROVIDER_SITE_OTHER): Payer: Self-pay

## 2024-03-14 NOTE — Telephone Encounter (Signed)
 That is fine to DC

## 2024-03-14 NOTE — Telephone Encounter (Signed)
 Lorie was informed by the patient that she was taking out of the wraps yesterday by Wauneta Haddock. And she now needs verbal orders to discontinue the leg wraps and to decrease visits to 1 time a week.   Please advise

## 2024-03-15 NOTE — Telephone Encounter (Signed)
 Message given

## 2024-03-15 NOTE — Telephone Encounter (Signed)
 Elizabeth [name] answered, but hung up. I called back and got the VM. I left a VM and for her to return the call and leave message if it was okay to leave a secure VM with the verbal orders of not. I will try again later.

## 2024-03-19 NOTE — Progress Notes (Signed)
 Subjective:    Patient ID: Elizabeth [name], female    DOB: Aug 06, 1951, 73 y.o.   MRN: [id number] Chief Complaint  Patient presents with   Follow-up    LS 7.18.24 GS. consult. lymphedema left leg with recurrent cellulitis + possible consideration for a lymph pump. miles, linda.    The patient presents today for evaluation of bilateral lower extremity edema and cellulitis.  She was treated for cellulitis and then placed into wraps bilaterally.  These have been done by home health.  Today her swelling is much improved and cellulitis has resolved.  There are no additional wounds or ulcerations.  There is currently no open weeping present today.    Review of Systems  Cardiovascular:  Positive for leg swelling.  All other systems reviewed and are negative.      Objective:    Physical Exam Vitals reviewed.  HENT:     Head: Normocephalic.  Cardiovascular:     Rate and Rhythm: Normal rate.  Pulmonary:     Effort: Pulmonary effort is normal.  Musculoskeletal:     Right lower leg: Edema present.     Left lower leg: Edema present.  Skin:    General: Skin is warm and dry.  Neurological:     Mental Status: She is alert and oriented to person, place, and time.  Psychiatric:        Mood and Affect: Mood normal.        Behavior: Behavior normal.        Thought Content: Thought content normal.        Judgment: Judgment normal.     BP 125/79   Pulse 75   Resp 18   Past Medical History:  Diagnosis Date   Cancer (HCC)    breast   Carcinoma of right breast (HCC) 05/20/2014   Diabetes mellitus without complication (HCC)    Hypertension    Lymphedema    Personal history of radiation therapy 2015   Right breast   PONV (postoperative nausea and vomiting)    Psoriasis 1990   Stroke (HCC) 2000   residual left arm weakness   Varicose veins     Social History   Socioeconomic History   Marital status: Married    Spouse name: Dallas Due   Number of children: Not on file    Years of education: Not on file   Highest education level: Not on file  Occupational History   Not on file  Tobacco Use   Smoking status: Never    Passive exposure: Never   Smokeless tobacco: Never  Vaping Use   Vaping status: Never Used  Substance and Sexual Activity   Alcohol use: No    Alcohol/week: 0.0 standard drinks of alcohol   Drug use: No   Sexual activity: Yes    Birth control/protection: Post-menopausal  Other Topics Concern   Not on file  Social History Narrative   Not on file   Social Drivers of Health   Financial Resource Strain: Not on file  Food Insecurity: No Food Insecurity (02/09/2024)   Hunger Vital Sign    Worried About Running Out of Food in the Last Year: Never true    Ran Out of Food in the Last Year: Never true  Transportation Needs: No Transportation Needs (02/09/2024)   PRAPARE - Administrator, Civil Service (Medical): No    Lack of Transportation (Non-Medical): No  Physical Activity: Not on file  Stress: Not on file  Social  Connections: Moderately Integrated (02/09/2024)   Social Connection and Isolation Panel [NHANES]    Frequency of Communication with Friends and Family: Once a week    Frequency of Social Gatherings with Friends and Family: Once a week    Attends Religious Services: 1 to 4 times per year    Active Member of Clubs or Organizations: No    Attends Banker Meetings: 1 to 4 times per year    Marital Status: Married  Catering manager Violence: Not At Risk (02/09/2024)   Humiliation, Afraid, Rape, and Kick questionnaire    Fear of Current or Ex-Partner: No    Emotionally Abused: No    Physically Abused: No    Sexually Abused: No    Past Surgical History:  Procedure Laterality Date   AMPUTATION Left 03/04/2021   Procedure: AMPUTATION RAY;  Surgeon: Angel Barba, DPM;  Location: [facility];  Service: Podiatry;  Laterality: Left;   AMPUTATION TOE Right 01/25/2017   Procedure: AMPUTATION TOE   ;  Surgeon:  Sharlyn Deaner, DPM;  Location: [facility];  Service: Podiatry;  Laterality: Right;   BREAST EXCISIONAL BIOPSY Right 2015   [facility] / with radiation   BREAST LUMPECTOMY Right 04-2014   followed by radiation,  no chemo   BREAST REDUCTION SURGERY  1977   CHOLECYSTECTOMY N/A 09/23/2015   Procedure: LAPAROSCOPIC CHOLECYSTECTOMY;  Surgeon: Baldomero Levans, MD;  Location: [facility];  Service: General;  Laterality: N/A;   DILATION AND CURETTAGE OF UTERUS  2010   EYE SURGERY Left 2015   cataracts and left eye detached retina repair   EYE SURGERY Right 2013   cataract with len implant   GASTRIC ROUX-EN-Y N/A 09/23/2015   Procedure: LAPAROSCOPIC ROUX-EN-Y GASTRIC;  Surgeon: Baldomero Levans, MD;  Location: [facility];  Service: General;  Laterality: N/A;   HIATAL HERNIA REPAIR N/A 09/23/2015   Procedure: LAPAROSCOPIC REPAIR OF HIATAL HERNIA;  Surgeon: Baldomero Levans, MD;  Location: [facility];  Service: General;  Laterality: N/A;   IRRIGATION AND DEBRIDEMENT FOOT Right 01/25/2017   Procedure: IRRIGATION AND DEBRIDEMENT FOOT;  Surgeon: Sharlyn Deaner, DPM;  Location: [facility];  Service: Podiatry;  Laterality: Right;   IRRIGATION AND DEBRIDEMENT FOOT Right 01/29/2017   Procedure: IRRIGATION AND DEBRIDEMENT FOOT and application of wound vac;  Surgeon: Sharlyn Deaner, DPM;  Location: [facility];  Service: Podiatry;  Laterality: Right;   LOWER EXTREMITY ANGIOGRAPHY Right 01/28/2017   Procedure: Lower Extremity Angiography;  Surgeon: Jackquelyn Mass, MD;  Location: [facility];  Service: Cardiovascular;  Laterality: Right;   LOWER EXTREMITY ANGIOGRAPHY Left 03/06/2021   Procedure: Lower Extremity Angiography;  Surgeon: Jackquelyn Mass, MD;  Location: [facility];  Service: Cardiovascular;  Laterality: Left;   LOWER EXTREMITY INTERVENTION  01/28/2017   Procedure: Lower Extremity Intervention;  Surgeon: Jackquelyn Mass, MD;  Location: [facility];  Service: Cardiovascular;;   REDUCTION MAMMAPLASTY       Family History  Problem Relation Age of Onset   Diabetes Mother    Cancer Mother    Breast cancer Mother 63   Diabetes Father    Diabetes Sister    Diabetes Brother     Allergies  Allergen Reactions   Levaquin [Levofloxacin] Other (See Comments)    Partial seizure       Latest Ref Rng & Units 02/12/2024    5:33 AM 02/10/2024    5:09 AM 02/09/2024   11:20 AM  CBC  WBC 4.0 -  10.5 K/uL 8.9  15.7  20.2   Hemoglobin 12.0 - 15.0 g/dL 16.1  09.6  04.5   Hematocrit 36.0 - 46.0 % 34.4  34.4  39.0   Platelets 150 - 400 K/uL 197  185  245        CMP     Component Value Date/Time   NA 133 (L) 02/13/2024 0506   NA 138 02/08/2024 1359   NA 139 11/18/2014 1011   K 3.7 02/13/2024 0506   K 3.7 11/18/2014 1011   CL 96 (L) 02/13/2024 0506   CL 100 11/18/2014 1011   CO2 28 02/13/2024 0506   CO2 35 (H) 11/18/2014 1011   GLUCOSE 140 (H) 02/13/2024 0506   GLUCOSE 70 11/18/2014 1011   BUN 35 (H) 02/13/2024 0506   BUN 34 (H) 02/08/2024 1359   BUN 22 (H) 11/18/2014 1011   CREATININE 1.54 (H) 02/13/2024 0506   CREATININE 0.88 11/18/2014 1011   CALCIUM  8.2 (L) 02/13/2024 0506   CALCIUM  9.0 11/18/2014 1011   PROT 7.3 02/09/2024 1120   PROT 6.5 11/10/2023 1403   PROT 7.5 11/18/2014 1011   ALBUMIN 3.4 (L) 02/09/2024 1120   ALBUMIN 3.9 11/10/2023 1403   ALBUMIN 3.4 11/18/2014 1011   AST 25 02/09/2024 1120   AST 17 11/18/2014 1011   ALT 18 02/09/2024 1120   ALT 26 11/18/2014 1011   ALKPHOS 75 02/09/2024 1120   ALKPHOS 80 11/18/2014 1011   BILITOT 1.6 (H) 02/09/2024 1120   BILITOT 0.6 11/10/2023 1403   BILITOT 0.4 11/18/2014 1011   EGFR 29 (L) 02/08/2024 1359   GFRNONAA 36 (L) 02/13/2024 0506   GFRNONAA >60 11/18/2014 1011   GFRNONAA >60 01/04/2014 1825     No results found.     Assessment & Plan:   1. Lymphedema (Primary) Today the patient swelling has been much better since she has been in [employer].  There is no open wounds or ulcerations and I think it is certainly  an appropriate time to move forward with conservative therapy including use of medical grade compression, elevation and activity.  She will return in 6 to 8 weeks to evaluate progress.  At that time we can also discuss lymphedema pump at that time depending on progress.  2. Essential hypertension Continue antihypertensive medications as already ordered, these medications have been reviewed and there are no changes at this time.  3. Mixed hyperlipidemia Continue statin as ordered and reviewed, no changes at this time   Current Outpatient Medications on File Prior to Visit  Medication Sig Dispense Refill   acetaminophen  (TYLENOL ) 325 MG tablet Take 2 tablets (650 mg total) by mouth every 6 (six) hours as needed for mild pain (or Fever >/= 101).     apixaban  (ELIQUIS ) 5 MG TABS tablet Take 1 tablet (5 mg total) by mouth 2 (two) times daily. 180 tablet 3   Bacillus Coagulans-Inulin  (PROBIOTIC) 1-250 BILLION-MG CAPS Take 2 capsules by mouth 3 (three) times daily. 30 capsule 0   Calcium  Carb-Cholecalciferol  (CALCIUM  1000 + D PO) Take 1 tablet by mouth daily.     clopidogrel  (PLAVIX ) 75 MG tablet Take 75 mg by mouth daily.     dapagliflozin  propanediol (FARXIGA ) 10 MG TABS tablet Take 1 tablet (10 mg total) by mouth daily. 30 tablet 1   escitalopram  (LEXAPRO ) 10 MG tablet Take 10 mg by mouth daily.     furosemide  (LASIX ) 40 MG tablet Take 1.5 tablets (60 mg total) by mouth 2 (two) times  daily. 270 tablet 3   nystatin  (MYCOSTATIN /NYSTOP ) powder Apply topically 2 (two) times daily. 15 g 0   potassium chloride  (KLOR-CON ) 10 MEQ tablet Take 2 tablets (20 mEq total) by mouth daily. 180 tablet 3   pravastatin  (PRAVACHOL ) 40 MG tablet Take 1 tablet (40 mg total) by mouth daily. In afternoon 30 tablet 3   Tafamidis  Meglumine , Cardiac, 20 MG CAPS Take 4 capsules (80 mg total) by mouth daily. 120 capsule 11   TRUE METRIX BLOOD GLUCOSE TEST test strip      TRUEPLUS LANCETS 33G MISC      No current  facility-administered medications on file prior to visit.    There are no Patient Instructions on file for this visit. No follow-ups on file.   Eschol Auxier E Kashis Penley, NP

## 2024-03-21 ENCOUNTER — Other Ambulatory Visit: Payer: Self-pay

## 2024-03-21 ENCOUNTER — Other Ambulatory Visit: Payer: Self-pay | Admitting: Pharmacy Technician

## 2024-03-21 NOTE — Progress Notes (Signed)
 Disenrolled; on 01/26/24- Rx sent to [facility].  [facility] last filled 12/12/2023

## 2024-03-27 ENCOUNTER — Encounter (INDEPENDENT_AMBULATORY_CARE_PROVIDER_SITE_OTHER): Payer: Self-pay

## 2024-04-17 ENCOUNTER — Telehealth: Payer: Self-pay | Admitting: Cardiology

## 2024-04-17 NOTE — Telephone Encounter (Signed)
 Called to confirm/remind patient of their appointment at the Advanced [facility] Failure Clinic on 04/18/24.   Appointment:   [x] Confirmed  [] Left mess   [] No answer/No voice mail  [] VM Full/unable to leave message  [] Phone not in service  Patient reminded to bring all medications and/or complete list.  Confirmed patient has transportation. Gave directions, instructed to utilize valet parking.

## 2024-04-18 ENCOUNTER — Ambulatory Visit: Attending: Cardiology | Admitting: Cardiology

## 2024-04-18 VITALS — BP 111/59 | HR 49 | Wt 184.6 lb

## 2024-04-18 DIAGNOSIS — I4891 Unspecified atrial fibrillation: Secondary | ICD-10-CM | POA: Diagnosis not present

## 2024-04-18 DIAGNOSIS — N1832 Chronic kidney disease, stage 3b: Secondary | ICD-10-CM | POA: Insufficient documentation

## 2024-04-18 DIAGNOSIS — I5032 Chronic diastolic (congestive) heart failure: Secondary | ICD-10-CM | POA: Insufficient documentation

## 2024-04-18 DIAGNOSIS — Z7901 Long term (current) use of anticoagulants: Secondary | ICD-10-CM | POA: Insufficient documentation

## 2024-04-18 DIAGNOSIS — I890 Lymphedema, not elsewhere classified: Secondary | ICD-10-CM | POA: Diagnosis not present

## 2024-04-18 DIAGNOSIS — I428 Other cardiomyopathies: Secondary | ICD-10-CM | POA: Diagnosis not present

## 2024-04-18 DIAGNOSIS — Z7984 Long term (current) use of oral hypoglycemic drugs: Secondary | ICD-10-CM | POA: Insufficient documentation

## 2024-04-18 DIAGNOSIS — Z79899 Other long term (current) drug therapy: Secondary | ICD-10-CM | POA: Insufficient documentation

## 2024-04-18 MED ORDER — FUROSEMIDE 40 MG PO TABS: 40.0000 mg | ORAL_TABLET | Freq: Two times a day (BID) | ORAL | 3 refills | Status: DC

## 2024-04-18 MED ORDER — SPIRONOLACTONE 25 MG PO TABS: 25.0000 mg | ORAL_TABLET | Freq: Every day | ORAL | 3 refills | Status: DC

## 2024-04-18 NOTE — Patient Instructions (Signed)
 Medication Changes:  DECREASE Furosemide  40mg  (1 tab) two times daily  START Spironolactone  25mg  (1 tab) daily  Lab Work:  Go DOWN to LOWER LEVEL (LL) to have your blood work completed inside of [employer] office.  We will only call you if the results are abnormal or if the provider would like to make medication changes.   Referrals:  You have been referred to [facility]. They will be in contact with you in order to schedule an appointment.   Follow-Up in: Please follow up with the Advanced Heart Failure Clinic in 3 months. Someone will be in contact with you in order to schedule an appointment for September.   At the Advanced [facility] Failure Clinic, you and your health needs are our priority. We have a designated team specialized in the treatment of Heart Failure. This Care Team includes your primary Heart Failure Specialized Cardiologist (physician), Advanced Practice Providers (APPs- Physician Assistants and Nurse Practitioners), and Pharmacist who all work together to provide you with the care you need, when you need it.   You may see any of the following providers on your designated Care Team at your next follow up:  Dr. Jules Oar Dr. Peder Bourdon Dr. Alwin Baars Dr. Judyth Nunnery Shawnee Dellen, FNP Bevely Brush, RPH-CPP  Please be sure to bring in all your medications bottles to every appointment.   Need to Contact Us :  If you have any questions or concerns before your next appointment please send us  a message through [place] or call our office at [phone].    TO LEAVE A MESSAGE FOR THE NURSE SELECT OPTION 2, PLEASE LEAVE A MESSAGE INCLUDING: YOUR NAME DATE OF BIRTH CALL BACK NUMBER REASON FOR CALL**this is important as we prioritize the call backs  YOU WILL RECEIVE A CALL BACK THE SAME DAY AS LONG AS YOU CALL BEFORE 4:00 PM

## 2024-04-19 LAB — BASIC METABOLIC PANEL WITH GFR
BUN/Creatinine Ratio: 19 (ref 12–28)
BUN: 30 mg/dL — ABNORMAL HIGH (ref 8–27)
CO2: 23 mmol/L (ref 20–29)
Calcium: 9.2 mg/dL (ref 8.7–10.3)
Chloride: 92 mmol/L — ABNORMAL LOW (ref 96–106)
Creatinine, Ser: 1.6 mg/dL — ABNORMAL HIGH (ref 0.57–1.00)
Glucose: 217 mg/dL — ABNORMAL HIGH (ref 70–99)
Potassium: 3.8 mmol/L (ref 3.5–5.2)
Sodium: 136 mmol/L (ref 134–144)
eGFR: 34 mL/min/{1.73_m2} — ABNORMAL LOW (ref >59)

## 2024-04-21 NOTE — Progress Notes (Signed)
   ADVANCED HEART FAILURE FOLLOW UP CLINIC NOTE  Referring Physician: Macie Saxon, MD  Primary Care: Macie Saxon, MD Primary Cardiologist:  HPI: Elizabeth [name] is a 73 y.o. female who presents for follow up of chronic diastolic HF.           First seen by cardiology in 07/2021 after having been admitted to the hospital in 04/2021 for TIA. Echo during that admission showed an EF of 60 to 65%, no regional wall motion abnormalities, normal LV diastolic function parameters, normal RV systolic function and ventricular cavity size, mild mitral regurgitation, and negative bubble study. Carotid artery ultrasound showed right greater than left carotid bifurcation atherosclerosis with less than 50% stenosis. Subsequent outpatient cardiac monitoring showed a predominant rhythm of sinus with first-degree AV block, second-degree AV block type I, 2 pauses with the longest being 3.4 seconds, rare atrial and ventricular ectopy, and no evidence of A-fib or flutter.   She was recently admitted in 06/2023 for a syncopal episode, was found to be in slow atrial fibrillation. No clear etiology for her stroke was identified. She was seen by EP with future plan for cardioversion and PPM placement, but she was found to be in heart failure so was referred to HF clinic. She was markedly volume overloaded but improving in clinic. She underwent a PYP scan that was read as grade 2.       SUBJECTIVE:  Recently admitted with diarrhea, diuretics had been decreaed and then increased back at last EP appointment. Does report urinating more, but overall is still short of breath and has significant lower etremity swelling. No open wounds or sores.   PMH, current medications, allergies, social history, and family history reviewed in epic.  PHYSICAL EXAM: Vitals:   04/18/24 0951  BP: (!) 111/59  Pulse: (!) 49  SpO2: 99%   GENERAL: Well nourished and in no apparent distress at rest.  PULM:  Normal work of  breathing, clear to auscultation bilaterally. Respirations are unlabored.  CARDIAC:  JVP: mildly elevated         Normal rate with regular rhythm. No murmurs, rubs or gallops.  3+ nonpitting  edema. Warm and well perfused extremities. ABDOMEN: Soft, non-tender, non-distended. NEUROLOGIC: Patient is oriented x3 with no focal or lateralizing neurologic deficits.    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 Will decrease diuretics back to 40mg  BID, restart spironolactone  and refer to lymphedema clinic. - Continue farxiga  10mg  daily, decrease lasix  to 40mg  BID - Restart spironlactone 25mg  daily - BMP today - lymphedema clinic referral - TTR genetics negative   Atrial fibrillation: Slow rate, follow-up with the EP arranged.  Lower extremity wounds have significantly improved but persistent swelling. - Continue apixaban  5mg  BID   CKD: Stage IIIb, stable on last labs.  -Repeat ordered today   Follow up in 3 months    Arta Lark, MD Advanced Heart Failure Mechanical Circulatory Support 04/21/24

## 2024-04-22 NOTE — Progress Notes (Signed)
 MRN : [id number]  Elizabeth [name] is a 73 y.o. (Dec 17, 1950) female who presents with chief complaint of legs hurt and swell.  History of Present Illness:   The patient returns to the office for followup evaluation regarding leg swelling.  The swelling has improved quite a bit and the pain associated with swelling has decreased substantially. There have not been any interval development of a ulcerations or wounds.  Since the previous visit the patient has been wearing graduated compression stockings and has noted some improvement in the lymphedema. The patient has been using compression routinely morning until night.  The patient also states elevation during the day and exercise (such as walking) is being done too.    No outpatient medications have been marked as taking for the 04/23/24 encounter (Appointment) with Jama, Cordella MATSU, MD.    Past Medical History:  Diagnosis Date   Cancer [facility])    breast   Carcinoma of right breast (HCC) 05/20/2014   Diabetes mellitus without complication (HCC)    Hypertension    Lymphedema    Personal history of radiation therapy 2015   Right breast   PONV (postoperative nausea and vomiting)    Psoriasis 1990   Stroke (HCC) 2000   residual left arm weakness   Varicose veins     Past Surgical History:  Procedure Laterality Date   AMPUTATION Left 03/04/2021   Procedure: AMPUTATION RAY;  Surgeon: Neill Boas, DPM;  Location: [facility] ORS;  Service: Podiatry;  Laterality: Left;   AMPUTATION TOE Right 01/25/2017   Procedure: AMPUTATION TOE   ;  Surgeon: Donnice Cory, DPM;  Location: [facility] ORS;  Service: Podiatry;  Laterality: Right;   BREAST EXCISIONAL BIOPSY Right 2015   [facility] / with radiation   BREAST LUMPECTOMY Right 04-2014   followed by radiation,  no chemo   BREAST REDUCTION SURGERY  1977   CHOLECYSTECTOMY N/A 09/23/2015   Procedure: LAPAROSCOPIC CHOLECYSTECTOMY;  Surgeon: Thom CHRISTELLA Pin, MD;  Location: [facility] ORS;  Service: General;   Laterality: N/A;   DILATION AND CURETTAGE OF UTERUS  2010   EYE SURGERY Left 2015   cataracts and left eye detached retina repair   EYE SURGERY Right 2013   cataract with len implant   GASTRIC ROUX-EN-Y N/A 09/23/2015   Procedure: LAPAROSCOPIC ROUX-EN-Y GASTRIC;  Surgeon: Thom CHRISTELLA Pin, MD;  Location: [facility] OR[facility];  Service: General;  Laterality: N/A;   HIATAL HERNIA REPAIR N/A 09/23/2015   Procedure: LAPAROSCOPIC REPAIR OF HIATAL HERNIA;  Surgeon: Thom CHRISTELLA Pin, MD;  Location: [facility];  Service: General;  Laterality: N/A;   IRRIGATION AND DEBRIDEMENT FOOT Right 01/25/2017   Procedure: IRRIGATION AND DEBRIDEMENT FOOT;  Surgeon: Donnice Cory, DPM;  Location: [facility] OR[facility];  Service: Podiatry;  Laterality: Right;   IRRIGATION AND DEBRIDEMENT FOOT Right 01/29/2017   Procedure: IRRIGATION AND DEBRIDEMENT FOOT and application of wound vac;  Surgeon: Donnice Cory, DPM;  Location: [facility];  Service: Podiatry;  Laterality: Right;   LOWER EXTREMITY ANGIOGRAPHY Right 01/28/2017   Procedure: Lower Extremity Angiography;  Surgeon: Cordella MATSU Jama, MD;  Location: [facility] INVASIVE CV LAB;  Service: Cardiovascular;  Laterality: Right;   LOWER EXTREMITY ANGIOGRAPHY Left 03/06/2021   Procedure: Lower Extremity Angiography;  Surgeon: Jama Cordella MATSU, MD;  Location: [facility] INVASIVE CV LAB;  Service: Cardiovascular;  Laterality: Left;   LOWER EXTREMITY INTERVENTION  01/28/2017   Procedure: Lower Extremity Intervention;  Surgeon: Cordella MATSU Jama, MD;  Location: [facility] INVASIVE CV  LAB;  Service: Cardiovascular;;   REDUCTION MAMMAPLASTY      Social History Social History   Tobacco Use   Smoking status: Never    Passive exposure: Never   Smokeless tobacco: Never  Vaping Use   Vaping status: Never Used  Substance Use Topics   Alcohol use: No    Alcohol/week: 0.0 standard drinks of alcohol   Drug use: No    Family History Family History  Problem Relation Age of Onset   Diabetes Mother    Cancer Mother     Breast cancer Mother 68   Diabetes Father    Diabetes Sister    Diabetes Brother     Allergies  Allergen Reactions   Levaquin [Levofloxacin] Other (See Comments)    Partial seizure     REVIEW OF SYSTEMS (Negative unless checked)  Constitutional: [] Weight loss  [] Fever  [] Chills Cardiac: [] Chest pain   [] Chest pressure   [] Palpitations   [] Shortness of breath when laying flat   [] Shortness of breath with exertion. Vascular:  [] Pain in legs with walking   [x] Pain in legs at rest  [] History of DVT   [] Phlebitis   [x] Swelling in legs   [] Varicose veins   [] Non-healing ulcers Pulmonary:   [] Uses home oxygen   [] Productive cough   [] Hemoptysis   [] Wheeze  [] COPD   [] Asthma Neurologic:  [] Dizziness   [] Seizures   [] History of stroke   [] History of TIA  [] Aphasia   [] Vissual changes   [] Weakness or numbness in arm   [] Weakness or numbness in leg Musculoskeletal:   [] Joint swelling   [] Joint pain   [] Low back pain Hematologic:  [] Easy bruising  [] Easy bleeding   [] Hypercoagulable state   [] Anemic Gastrointestinal:  [] Diarrhea   [] Vomiting  [] Gastroesophageal reflux/heartburn   [] Difficulty swallowing. Genitourinary:  [] Chronic kidney disease   [] Difficult urination  [] Frequent urination   [] Blood in urine Skin:  [] Rashes   [] Ulcers  Psychological:  [] History of anxiety   []  History of major depression.  Physical Examination  There were no vitals filed for this visit. There is no height or weight on file to calculate BMI. Gen: WD/WN, NAD Head: [place]/AT, No temporalis wasting.  Ear/Nose/Throat: Hearing grossly intact, nares w/o erythema or drainage, pinna without lesions Eyes: PER, EOMI, sclera nonicteric.  Neck: Supple, no gross masses.  No JVD.  Pulmonary:  Good air movement, no audible wheezing, no use of accessory muscles.  Cardiac: RRR, precordium not hyperdynamic. Vascular:  scattered varicosities present bilaterally.  Moderate venous stasis changes to the legs bilaterally.  2+ soft  pitting edema. CEAP [id number]   Vessel Right Left  Radial Palpable Palpable  Gastrointestinal: soft, non-distended. No guarding/no peritoneal signs.  Musculoskeletal: M/S 5/5 throughout.  No deformity.  Neurologic: CN 2-12 intact. Pain and light touch intact in extremities.  Symmetrical.  Speech is fluent. Motor exam as listed above. Psychiatric: Judgment intact, Mood & affect appropriate for pt's clinical situation. Dermatologic: Venous rashes no ulcers noted.  No changes consistent with cellulitis. Lymph : No lichenification or skin changes of chronic lymphedema.  CBC Lab Results  Component Value Date   WBC 8.9 02/12/2024   HGB 11.6 (L) 02/12/2024   HCT 34.4 (L) 02/12/2024   MCV 89.1 02/12/2024   PLT 197 02/12/2024    BMET    Component Value Date/Time   NA 136 04/18/2024 1039   NA 139 11/18/2014 1011   K 3.8 04/18/2024 1039   K 3.7 11/18/2014 1011   CL 92 (L)  04/18/2024 1039   CL 100 11/18/2014 1011   CO2 23 04/18/2024 1039   CO2 35 (H) 11/18/2014 1011   GLUCOSE 217 (H) 04/18/2024 1039   GLUCOSE 140 (H) 02/13/2024 0506   GLUCOSE 70 11/18/2014 1011   BUN 30 (H) 04/18/2024 1039   BUN 22 (H) 11/18/2014 1011   CREATININE 1.60 (H) 04/18/2024 1039   CREATININE 0.88 11/18/2014 1011   CALCIUM  9.2 04/18/2024 1039   CALCIUM  9.0 11/18/2014 1011   GFRNONAA 36 (L) 02/13/2024 0506   GFRNONAA >60 11/18/2014 1011   GFRNONAA >60 01/04/2014 1825   GFRAA 54 (L) 06/27/2019 0506   GFRAA >60 11/18/2014 1011   GFRAA >60 01/04/2014 1825   Estimated Creatinine Clearance: 32.6 mL/min (A) (by C-G formula based on SCr of 1.6 mg/dL (H)).  COAG Lab Results  Component Value Date   INR 2.1 (H) 02/09/2024   INR 1.2 06/06/2022   INR 1.1 06/05/2022    Radiology No results found.   Assessment/Plan 1. Chronic venous insufficiency (Primary) Recommend:   No surgery or intervention at this point in time.     I have reviewed my discussion with the patient regarding lymphedema and why it   causes symptoms.  Patient will continue wearing graduated compression on a daily basis. The patient should put the compression on first thing in the morning and removing them in the evening. The patient should not sleep in the compression.    In addition, behavioral modification throughout the day will be continued.  This will include frequent elevation (such as in a recliner), use of over the counter pain medications as needed and exercise such as walking.   The systemic causes for chronic edema such as liver, kidney and cardiac etiologies does not appear to have significant changed over the past year.     The patient will continue aggressive use of the  lymph pump.  This will continue to improve the edema control and prevent sequela such as ulcers and infections.   2. Lymphedema Recommend:   No surgery or intervention at this point in time.     I have reviewed my discussion with the patient regarding lymphedema and why it  causes symptoms.  Patient will continue wearing graduated compression on a daily basis. The patient should put the compression on first thing in the morning and removing them in the evening. The patient should not sleep in the compression.    In addition, behavioral modification throughout the day will be continued.  This will include frequent elevation (such as in a recliner), use of over the counter pain medications as needed and exercise such as walking.   The systemic causes for chronic edema such as liver, kidney and cardiac etiologies does not appear to have significant changed over the past year.     The patient will continue aggressive use of the  lymph pump.  This will continue to improve the edema control and prevent sequela such as ulcers and infections.   3. Primary hypertension Continue antihypertensive medications as already ordered, these medications have been reviewed and there are no changes at this time.  4. 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  Hgb A1C to be monitored as already arranged by primary service  5. Paroxysmal atrial fibrillation (HCC) Continue antiarrhythmia medications as already ordered, these medications have been reviewed and there are no changes at  this time.  Continue anticoagulation as ordered by Cardiology Service    Cordella Shawl, MD  04/22/2024 2:57 PM

## 2024-04-23 ENCOUNTER — Ambulatory Visit (INDEPENDENT_AMBULATORY_CARE_PROVIDER_SITE_OTHER): Admitting: Vascular Surgery

## 2024-04-23 ENCOUNTER — Encounter (INDEPENDENT_AMBULATORY_CARE_PROVIDER_SITE_OTHER): Payer: Self-pay | Admitting: Vascular Surgery

## 2024-04-23 VITALS — Resp 16 | Ht 63.0 in

## 2024-04-23 DIAGNOSIS — I480 Paroxysmal atrial fibrillation: Secondary | ICD-10-CM

## 2024-04-23 DIAGNOSIS — E1151 Type 2 diabetes mellitus with diabetic peripheral angiopathy without gangrene: Secondary | ICD-10-CM | POA: Diagnosis not present

## 2024-04-23 DIAGNOSIS — I10 Essential (primary) hypertension: Secondary | ICD-10-CM

## 2024-04-23 DIAGNOSIS — I890 Lymphedema, not elsewhere classified: Secondary | ICD-10-CM | POA: Diagnosis not present

## 2024-04-23 DIAGNOSIS — I872 Venous insufficiency (chronic) (peripheral): Secondary | ICD-10-CM

## 2024-04-27 ENCOUNTER — Other Ambulatory Visit: Payer: Self-pay

## 2024-04-27 ENCOUNTER — Emergency Department

## 2024-04-27 ENCOUNTER — Emergency Department
Admission: EM | Admit: 2024-04-27 | Discharge: 2024-04-27 | Disposition: A | Discharge status: Home / Self Care | Attending: Emergency Medicine | Admitting: Emergency Medicine

## 2024-04-27 DIAGNOSIS — I110 Hypertensive heart disease with heart failure: Secondary | ICD-10-CM | POA: Insufficient documentation

## 2024-04-27 DIAGNOSIS — I509 Heart failure, unspecified: Secondary | ICD-10-CM | POA: Diagnosis not present

## 2024-04-27 DIAGNOSIS — R0789 Other chest pain: Secondary | ICD-10-CM | POA: Diagnosis present

## 2024-04-27 DIAGNOSIS — N3001 Acute cystitis with hematuria: Secondary | ICD-10-CM | POA: Diagnosis not present

## 2024-04-27 DIAGNOSIS — E119 Type 2 diabetes mellitus without complications: Secondary | ICD-10-CM | POA: Diagnosis not present

## 2024-04-27 DIAGNOSIS — R079 Chest pain, unspecified: Secondary | ICD-10-CM

## 2024-04-27 LAB — TROPONIN I (HIGH SENSITIVITY)
Troponin I (High Sensitivity): 14 ng/L (ref <18)
Troponin I (High Sensitivity): 15 ng/L (ref <18)

## 2024-04-27 LAB — URINALYSIS, ROUTINE W REFLEX MICROSCOPIC
Bilirubin Urine: NEGATIVE
Glucose, UA: >=500 mg/dL — AB
Ketones, ur: NEGATIVE mg/dL
Nitrite: NEGATIVE
Protein, ur: NEGATIVE mg/dL
Specific Gravity, Urine: 1.007 (ref 1.005–1.030)
WBC, UA: >50 WBC/hpf (ref 0–5)
pH: 5 (ref 5.0–8.0)

## 2024-04-27 LAB — CBC
HCT: 39.1 % (ref 36.0–46.0)
Hemoglobin: 12.8 g/dL (ref 12.0–15.0)
MCH: 28.3 pg (ref 26.0–34.0)
MCHC: 32.7 g/dL (ref 30.0–36.0)
MCV: 86.5 fL (ref 80.0–100.0)
Platelets: 287 10*3/uL (ref 150–400)
RBC: 4.52 MIL/uL (ref 3.87–5.11)
RDW: 13.8 % (ref 11.5–15.5)
WBC: 9.7 10*3/uL (ref 4.0–10.5)
nRBC: 0 % (ref 0.0–0.2)

## 2024-04-27 LAB — BASIC METABOLIC PANEL WITH GFR
Anion gap: 8 (ref 5–15)
BUN: 30 mg/dL — ABNORMAL HIGH (ref 8–23)
CO2: 27 mmol/L (ref 22–32)
Calcium: 8.7 mg/dL — ABNORMAL LOW (ref 8.9–10.3)
Chloride: 101 mmol/L (ref 98–111)
Creatinine, Ser: 1.82 mg/dL — ABNORMAL HIGH (ref 0.44–1.00)
GFR, Estimated: 29 mL/min — ABNORMAL LOW (ref >60)
Glucose, Bld: 159 mg/dL — ABNORMAL HIGH (ref 70–99)
Potassium: 3.6 mmol/L (ref 3.5–5.1)
Sodium: 136 mmol/L (ref 135–145)

## 2024-04-27 NOTE — ED Provider Notes (Signed)
 [facility] Emergency Department Provider Note     Event Date/Time   First MD Initiated Contact with Patient 04/27/24 1919     (approximate)   History   UTI and Chest Pain   HPI  Elizabeth [name] is a 73 y.o. female with a history of HTN, CHF and diabetes mellitus presents to the ED for evaluation of persistent urinary symptoms after being diagnosed with a UTI 2 days ago.  She was started on ciprofloxacin  and has taken 1 dose but reports no improvement in symptoms.  She also notes she experienced chest pain prior to arrival which has since resolved and has not reoccurred.  She denies shortness of breath, abdominal pain, fever, nausea or vomiting.    Physical Exam   Triage Vital Signs: ED Triage Vitals  Encounter Vitals Group     BP 04/27/24 1714 135/68     Girls Systolic BP Percentile --      Girls Diastolic BP Percentile --      Boys Systolic BP Percentile --      Boys Diastolic BP Percentile --      Pulse Rate 04/27/24 1714 (!) 55     Resp 04/27/24 1714 17     Temp 04/27/24 1714 98.4 F (36.9 C)     Temp Source 04/27/24 1714 Oral     SpO2 04/27/24 1714 98 %     Weight 04/27/24 1706 185 lb (83.9 kg)     Height 04/27/24 1706 5' 3 (1.6 m)     Head Circumference --      Peak Flow --      Pain Score 04/27/24 1706 0     Pain Loc --      Pain Education --      Exclude from Growth Chart --     Most recent vital signs: Vitals:   04/27/24 1714  BP: 135/68  Pulse: (!) 55  Resp: 17  Temp: 98.4 F (36.9 C)  SpO2: 98%   General: Well appearing. Alert and oriented. INAD.  Head:  NCAT.  CV:  Good peripheral perfusion. RRR. Bilateral peripheral edema, patient reports this is her baseline RESP:  Normal effort. LCTAB.  ABD:  No distention. Soft, Non tender. No CVA tenderness bilaterally.   ED Results / Procedures / Treatments   Labs (all labs ordered are listed, but only abnormal results are displayed) Labs Reviewed  URINALYSIS, ROUTINE W  REFLEX MICROSCOPIC - Abnormal; Notable for the following components:      Result Value   Color, Urine YELLOW (*)    APPearance CLOUDY (*)    Glucose, UA >=500 (*)    Hgb urine dipstick SMALL (*)    Leukocytes,Ua LARGE (*)    Bacteria, UA RARE (*)    All other components within normal limits  BASIC METABOLIC PANEL WITH GFR - Abnormal; Notable for the following components:   Glucose, Bld 159 (*)    BUN 30 (*)    Creatinine, Ser 1.82 (*)    Calcium  8.7 (*)    GFR, Estimated 29 (*)    All other components within normal limits  URINE CULTURE  CBC  TROPONIN I (HIGH SENSITIVITY)  TROPONIN I (HIGH SENSITIVITY)   RADIOLOGY  I personally viewed and evaluated these images as part of my medical decision making, as well as reviewing the written report by the radiologist.  ED Provider Interpretation: No acute cardiopulmonary disease  DG Chest Port 1 View Result Date: 04/27/2024 CLINICAL DATA:  Chest pain, urinary tract infection EXAM: PORTABLE CHEST 1 VIEW COMPARISON:  02/09/2024 FINDINGS: Single frontal view of the chest demonstrates an unremarkable cardiac silhouette. There is chronic interstitial prominence consistent with scarring. No acute airspace disease, effusion, or pneumothorax. No acute bony abnormalities. IMPRESSION: 1. Stable chest, no acute process. Electronically Signed   By: Bobbye Burrow M.D.   On: 04/27/2024 19:43    PROCEDURES:  Critical Care performed: No  Procedures   MEDICATIONS ORDERED IN ED: Medications - No data to display   IMPRESSION / MDM / ASSESSMENT AND PLAN / ED COURSE  I reviewed the triage vital signs and the nursing notes.                               73 y.o. female presents to the emergency department for evaluation and treatment of persistent UTI without completed dose of ciprofloxacin  prescribed 2 days ago. See HPI for further details.   Differential diagnosis includes, but is not limited to UTI, ACS, AMI, CHF,  pulmonary embolism,  pericarditis, myocarditis  Patient's presentation is most consistent with acute complicated illness / injury requiring diagnostic workup.  Patient with recent UTI diagnosis and complete course of antibiotics presents with ongoing symptoms likely due to sufficient time for antibiotic fact.  No signs of systemic infection or abdominal pathology.  Otherwise labs look reassuring.  BUN and creatinine are elevated but this appears to be patient's baseline as it is consistent with her trend.  Troponin not extremely elevated.  Low suspicion for AMI.  Considered PE but presentation is not consistent and Wells criteria places patient in the low risk group.  EKG reassuring.  Urinalysis reveals large leukocytes.  I did send a urine culture that is pending.  I advised patient to continue ciprofloxacin  antibiotic until dose is complete.  She verbalized understanding.  She is in stable condition for discharge home.  ED return precautions discussed.  FINAL CLINICAL IMPRESSION(S) / ED DIAGNOSES   Final diagnoses:  Acute cystitis with hematuria  Nonspecific chest pain   Rx / DC Orders   ED Discharge Orders     None        Note:  This document was prepared using Dragon voice recognition software and may include unintentional dictation errors.    [name], [place] A, PA-C 04/27/24 2312    Viviano Ground, MD 04/27/24 [phone]

## 2024-04-27 NOTE — ED Notes (Signed)
 Pt to front desk stating that she forgot to tell the nurse when she was triaged that earlier today she was having chest pain.

## 2024-04-27 NOTE — ED Triage Notes (Signed)
 Pt sts that she was dx with a UTI two days ago and then was prescribed Cipro . Pt sts that she doesn't think that it is working.

## 2024-04-27 NOTE — Discharge Instructions (Addendum)
 You were evaluated in the ED for UTI and chest pain.  Your lab work is reassuring.  Your does reveal a UTI.  You have already been prescribed a antibiotic.  You are advised to continue this antibiotic until you complete the dosage.  A urine culture is pending.  If we need to change the antibiotics you will receive a call.  In the interim stay hydrated and get plenty of rest.  Follow-up with your primary care doctor in 3 days.

## 2024-04-28 ENCOUNTER — Encounter (INDEPENDENT_AMBULATORY_CARE_PROVIDER_SITE_OTHER): Payer: Self-pay | Admitting: Vascular Surgery

## 2024-04-28 LAB — URINE CULTURE: Culture: <10000 — AB

## 2024-05-15 ENCOUNTER — Ambulatory Visit: Attending: Cardiology | Admitting: Cardiology

## 2024-05-15 NOTE — Progress Notes (Deleted)
 Cardiology Office Note   Date:  05/15/2024  ID:  SALLIE STARON, DOB August 25, 1951, MRN [id number] PCP: Buren Rock HERO, MD  [facility] Providers Cardiologist:  Redell Cave, MD Electrophysiologist:  OLE ONEIDA HOLTS, MD { Click to update primary MD,subspecialty MD or APP then REFRESH:1}    History of Present Illness Elizabeth [name] is a 73 y.o. 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   Previously was hospitalized 04/2021 for TIA. Echocardiogram showed EF 60 to 65%, no RWMA, mild MR, negative bubble study. Carotid artery ultrasound showed a right greater than left carotid bifurcation atherosclerosis with less than 50% stenosis. Subsequent outpatient cardiac monitoring revealed sinus rhythm with first-degree AV block, second-degree AV block type I, 2 pauses with the longest lasting 3.4 seconds, rare atrial and ventricular ectopy with no evidence of atrial fibrillation/flutter.   Admitted 06/28/23 repeat Echocardiogram revealed LVEF 55 to 60%, no RWMA, mild mitral valve regurgitation.  Carotid duplex revealed minimal to moderate amount of bilateral atherosclerotic plaque right greater than left similar to 04/2021 study.  MRI was positive for small cerebellar stroke, less likely the cause of her syncopal episode.  Neurology was consulted as well.  She was started on OAC with apixaban  for CHA2DS2-VASc score of at least 7.  MRI of the brain revealed near occlusion in the right distal P2, moderate stenosis of the proximal right P2.  Multifocal moderate  stenosis in the anterior right M2 and mild stenosis in the left supra clinoid ICA.  EEG was without concern for seizures.  Clopidogrel  was discontinued after she was continued on apixaban  to decrease the risk of bleeding.    She was evaluated by EP 08/24/2023 for atrial fibrillation with SVR and syncope.  ZIO monitor reviewed revealed 9% atrial fibrillation with ventricular rate of 28-86 and average 50 bpm.  She had 63 pauses with the longest lasting 3.7 seconds with rare ectopy.  In the long-term she would likely benefit from a sinus rhythm with cardioversion but before performing cardioversion she will likely require permanent pacemaker placement given her ventricular rates and history of syncope.  Unfortunately with her blood pressure weeping of fluid and wound she was not a candidate for pacemaker placement at the time.  She was sent for amyloid panel advised to follow-up with advanced heart failure urgently.  She followed up with advanced heart failure 08/26/2023.  Due to being volume she was started on spironolactone  25 mg daily, continued on furosemide  40 mg daily and was ordered a PYP scan for the remainder of amyloid workup.  She was not a candidate for SGLT2 inhibitor due to recurrent UTIs.  No other medication changes were made and she was scheduled for follow-up after her scan was completed.    Returned to clinic 09/13/2023 she been doing well.  Legs are continue to be swollen with weeping.  Weight was down approximately 15 pounds.  She did have follow-up with the EP and followed up with advanced heart failure who doubled up her diuretic medication.  She stated she did have scant emesis, but not at the results of that yet.  Medication regimen states the same  and she was scheduled for follow-up BMP.  She was encouraged to keep her appointments heart failure and EP.  She was hospitalized at [facility] on 10/02/2023 for partial seizure, AKI on CKD, and an abnormal UA.  There was also concern for recent  diagnoses of cardiac amyloidosis.  He has been approximately 30 years since she had had a seizure but she had recently been on Levaquin which lowered her seizure threshold.  EEG showed cortical dysfunction arising from the right hemisphere, maximal right frontal region likely secondary to underlying.  No seizure epileptiform discharges were seen.  Levaquin was discontinued and she was placed on doxycycline .  No further seizures were noted in the hospital and neurology following.  Lisinopril  and spironolactone  were placed on hold.  She was restarted on her Lasix  kidney function improved to baseline and she was subsequently considered stable for discharge on 10/10/2023 to SNF.   She was seen by me on [01/24/2024 following up with concern for cardiac amyloid.  TTR genetic panel was sent.  Midodrine  was stopped.  She continues to remain in atrial fibrillation with a slower rate and follow-up with the EP.  Her lower extremity wounds have significantly improved.  She been compliant with her current medication regimen.  She was last seen in clinic 02/08/2024 where she had been recently followed by advanced heart failure with Lasix  increased to 40 mg twice daily.  She continued to have swelling to bilateral lower extremities but the skin itself is healed without open areas or weeping.  She was positive for nonproductive cough that started this morning but she thinks she may have had a fever and was taking Tylenol .  She stated she had been compliant with her current medications.   She was seen by EP on 03/08/2024.  Lower leg initiated significantly improved since finishing clinic.  Continue to have occasional chest discomfort and DOE.  No recurrent syncopal episodes.  Was continued on furosemide  40 mg twice daily.  Lasix  was increased to 60 mg twice daily.  EP was then evaluated for implantation of permanent pacemaker placement.  She was evaluated by advanced heart failure clinic on 04/18/2024.  Continues to have 3+  nonpitting edema in her bilateral lower extremities.  Lasix  was decreased to 40 mg twice daily and she was restarted on spironolactone  she also was referred to vein and vascular for lymphedema.  She was evaluated by vein and vascular on 04/23/2024 for chronic venous insufficiency with no surgical or intervention was needed at this time.  She was recommended to continue aggressive use of her lymphedema pumps that would continue the edema control and prevent the sequela such as ulcers and infections.  There were no other medication changes that were made and further testing that was ordered.  She was evaluated in the [facility] emergency department on 04/27/2024 with complaints of UTI and chest pain.  Stated that she had started to have persistent urinary symptoms after being diagnosed with UTI 2 days prior.  She was started on ciprofloxacin  take 1 dose and reported no improvement.  She also noted she experienced chest pain prior to arrival which had since resolved.  Vitals were stable.  Serum creatinine was 1.82 with a BUN of 30.  Chest x-ray showed no acute process.  With recent diagnosis of UTI, complete course of antibiotics presents with ongoing symptoms likely due to to insufficient time for antibiotic to work.  No signs of systemic infection or abdominal pathology.  Low suspicion for AMI as troponins were not  extremely elevated and trended flat.  EKG was reassuring.  She was to continue her ciprofloxacin  and if the urine culture came back that antibiotics need changed she would be notified at that time.  She returns to clinic today   ROS: 10 point review of system was reviewed and considered negative except what is listed in the HPI  Studies Reviewed      Myocardial Amyloid 09/07/23   Myocardial uptake was positive for radiotracer uptake. The visual grade of myocardial uptake relative to the ribs was Grade 2 (Myocardial uptake equal to rib uptake).   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   Prior study not available for comparison.   Event Monitor (Zio) 08/18/23 Atrial Fibrillation occurred continuously (100% burden), ranging from 28-86 bpm (avg of 50 bpm). 63 Pauses occurred, the longest lasting 3.7 secs (16 bpm). Isolated VEs were rare (<1.0%), and no VE Couplets or VE Triplets were present. MD notification  criteria for Slow Atrial Fibrillation met - report posted prior to notification per account request ([place]).   Carotid Duplex 07/03/23 IMPRESSION: Minimal to moderate amount of bilateral atherosclerotic plaque, right-greater-than-left, morphologically similar to the 04/2021 examination and again not resulting in a hemodynamically significant stenosis within either internal carotid artery.   2D echo 07/03/2023: 1. Left ventricular ejection fraction, by estimation, is 55 to 60%. The  left ventricle has normal function. The left ventricle has no regional  wall motion abnormalities. Left ventricular diastolic parameters are  indeterminate.   2. Right ventricular systolic function is normal. The right ventricular  size is mildly enlarged.   3. The mitral valve is normal in structure. Mild mitral valve  regurgitation.   4. The aortic valve is tricuspid. Aortic valve regurgitation is not  visualized.   5. The inferior vena cava is normal in size with <50% respiratory  variability, suggesting right atrial pressure of 8 mmHg.   Comparison(s): The left ventricular function is unchanged.    Zio patch 07/2021: Patient had a min HR of 43 bpm, max HR of 203 bpm, and avg HR of 65 bpm. Predominant underlying rhythm was Sinus Rhythm. First Degree AV Block was present. 2 Pauses occurred, the longest lasting 3.4 secs (17 bpm). Second Degree AV Block-Mobitz I  (Wenckebach) was present. Isolated SVEs were rare (<1.0%), SVE Couplets were rare (<1.0%), and SVE Triplets were rare (<1.0%). Isolated VEs were rare (<1.0%), VE Couplets were rare (<1.0%), and no VE Triplets were  present. No evidence of atrial fibrillation or atrial flutter noted to explain symptoms of CVA.   Limited echo with bubble study 04/27/2021: 1. Left ventricular ejection fraction, by estimation, is 60 to 65%. The  left ventricle has normal function. The left ventricle has no regional  wall motion abnormalities. Left ventricular diastolic parameters were  normal.   2. Right ventricular systolic function is normal. The right ventricular  size is normal.   3. The mitral valve is normal in structure. Mild mitral valve  regurgitation.   4. The aortic valve is normal in structure. Aortic valve regurgitation is  not visualized.   IAS/Shunts: No atrial level shunt detected by color flow Doppler. Agitated  saline contrast was given intravenously to evaluate for intracardiac  shunting. There is no evidence of a patent foramen ovale. There is no  evidence of an atrial septal defect.    Carotid artery ultrasound 04/27/2021: IMPRESSION: Right greater than left carotid bifurcation atherosclerosis with less than 50% stenosis. Risk Assessment/Calculations  CHA2DS2-VASc  Score = 8  {Confirm score is correct.  If not, click here to update score.  REFRESH note.  :1} This indicates a 10.8% annual risk of stroke. The patient's score is based upon: CHF History: 1 HTN History: 1 Diabetes History: 1 Stroke History: 2 Vascular Disease History: 1 Age Score: 1 Gender Score: 1   {This patient has a significant risk of stroke if diagnosed with atrial fibrillation.  Please consider VKA or DOAC agent for anticoagulation if the bleeding risk is acceptable.   You can also use the SmartPhrase .[email or web address] for documentation.   :[id number]} No BP recorded.  {Refresh Note OR Click here to enter BP  :1}***       Physical Exam VS:  There were no vitals taken for this visit.       Wt Readings from Last 3 Encounters:  04/27/24 185 lb (83.9 kg)  04/18/24 184 lb 9.6 oz (83.7 kg)  03/07/24 186 lb 3.2 oz (84.5 kg)     GEN: Well nourished, well developed in no acute distress NECK: No JVD; No carotid bruits CARDIAC: ***RRR, no murmurs, rubs, gallops RESPIRATORY:  Clear to auscultation without rales, wheezing or rhonchi  ABDOMEN: Soft, non-tender, non-distended EXTREMITIES:  No edema; No deformity   ASSESSMENT AND PLAN Chronic HFpEF with LVEF 55 to 60%, no RWMA, mild MR, Paroxysmal atrial fibrillation CKD stage IV History of CVA with acute cerebellar infarct on MRI of the brain with previous admission Primary hypertension Type 2 diabetes Peripheral vascular disease Lymphedema    {Are you ordering a CV Procedure (e.g. stress test, cath, DCCV, TEE, etc)?   Press F2        :[id number]}  Dispo: ***  Signed, Blase Beckner, NP

## 2024-06-05 NOTE — Progress Notes (Unsigned)
  Electrophysiology Office Follow up Visit Note:    Date:  06/06/2024   ID:  Elizabeth [name], DOB 02-02-1951, MRN [id number]  PCP:  Buren Rock HERO, MD  [facility] Cardiologist:  Redell Cave, MD  [facility] Electrophysiologist:  OLE ONEIDA HOLTS, MD    Interval History:     Elizabeth [name] is a 73 y.o. female who presents for a follow up visit.   She was last seen by Elvie March 07, 2024.  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  I last saw the patient in Elizabeth 2024.  At that appointment she was profoundly fluid overloaded.  The plan was to consider permanent pacing if she achieved euvolemia.  She saw Dr. Zenaida on April 18, 2024.  At that appointment she was still significantly volume overloaded and complained of dyspnea.  Today she is doing well.  Her volume status continues to improve.  She is with her husband today in clinic.  No syncope or presyncope.  No lightheadedness.      Past medical, surgical, social and family history were reviewed.  ROS:   Please see the history of present illness.    All other systems reviewed and are negative.  EKGs/Labs/Other Studies Reviewed:    The following studies were reviewed today:  April 30, 2024 EKG shows atrial fibrillation with a ventricular rate of 49 bpm        Physical Exam:    VS:  BP 104/64   Pulse 95   Ht 5' 3 (1.6 m)   Wt 172 lb (78 kg)   SpO2 97%   BMI 30.47 kg/m     Wt Readings from Last 3 Encounters:  06/06/24 172 lb (78 kg)  04/27/24 185 lb (83.9 kg)  04/18/24 184 lb 9.6 oz (83.7 kg)     GEN: no distress.  Chronically ill-appearing CARD: Irregularly irregular, No MRG.  Lower extremities with lymphedema.   RESP: No IWOB. CTAB.      ASSESSMENT:    1. Chronic diastolic CHF (congestive heart failure) (HCC)   2. Lymphedema    3. Permanent atrial fibrillation (HCC)    PLAN:    In order of problems listed above:  #Atrial fibrillation with slow ventricular rates Stable rates.  Thankfully, no syncope or presyncope.  I do not think a permanent pacemaker is needed at this time.  #Chronic diastolic heart failure #Cardiac amyloidosis Volume status is improving.  Physical exam is complicated because of contribution from lymphedema.  Follow-up with Dr. Zenaida.  Follow-up with 6 months with APP.  Can likely moved to as needed follow-up if heart rates and symptoms remain stable at that appointment.   Signed, OLE HOLTS, MD, [facility], [facility] 06/06/2024 10:11 AM    Electrophysiology [facility]

## 2024-06-06 ENCOUNTER — Ambulatory Visit: Attending: Cardiology | Admitting: Cardiology

## 2024-06-06 VITALS — BP 104/64 | HR 95 | Ht 63.0 in | Wt 172.0 lb

## 2024-06-06 DIAGNOSIS — I5032 Chronic diastolic (congestive) heart failure: Secondary | ICD-10-CM

## 2024-06-06 DIAGNOSIS — I4821 Permanent atrial fibrillation: Secondary | ICD-10-CM

## 2024-06-06 DIAGNOSIS — I890 Lymphedema, not elsewhere classified: Secondary | ICD-10-CM

## 2024-06-06 NOTE — Patient Instructions (Signed)
 Medication Instructions:  Your physician recommends that you continue on your current medications as directed. Please refer to the Current Medication list given to you today.  *If you need a refill on your cardiac medications before your next appointment, please call your pharmacy*  Follow-Up: At [facility], you and your health needs are our priority.  As part of our continuing mission to provide you with exceptional heart care, our providers are all part of one team.  This team includes your primary Cardiologist (physician) and Advanced Practice Providers or APPs (Physician Assistants and Nurse Practitioners) who all work together to provide you with the care you need, when you need it.  Your next appointment:   6 months  Provider:   You will see one of the following Advanced Practice Providers on your designated Care Team:   Mertha Abrahams, Kennard Pea [address] G[place], PA-C Suzann Riddle, NP Creighton Doffing, NP

## 2024-08-01 ENCOUNTER — Emergency Department
Admission: EM | Admit: 2024-08-01 | Discharge: 2024-08-02 | Disposition: A | Discharge status: Home / Self Care | Attending: Emergency Medicine | Admitting: Emergency Medicine

## 2024-08-01 ENCOUNTER — Emergency Department

## 2024-08-01 ENCOUNTER — Encounter: Payer: Self-pay | Admitting: Emergency Medicine

## 2024-08-01 ENCOUNTER — Other Ambulatory Visit: Payer: Self-pay

## 2024-08-01 DIAGNOSIS — E119 Type 2 diabetes mellitus without complications: Secondary | ICD-10-CM | POA: Insufficient documentation

## 2024-08-01 DIAGNOSIS — N3001 Acute cystitis with hematuria: Secondary | ICD-10-CM

## 2024-08-01 DIAGNOSIS — R109 Unspecified abdominal pain: Secondary | ICD-10-CM | POA: Insufficient documentation

## 2024-08-01 DIAGNOSIS — M545 Low back pain, unspecified: Secondary | ICD-10-CM | POA: Insufficient documentation

## 2024-08-01 DIAGNOSIS — I509 Heart failure, unspecified: Secondary | ICD-10-CM | POA: Insufficient documentation

## 2024-08-01 DIAGNOSIS — N12 Tubulo-interstitial nephritis, not specified as acute or chronic: Secondary | ICD-10-CM

## 2024-08-01 DIAGNOSIS — R110 Nausea: Secondary | ICD-10-CM | POA: Diagnosis not present

## 2024-08-01 DIAGNOSIS — I110 Hypertensive heart disease with heart failure: Secondary | ICD-10-CM | POA: Insufficient documentation

## 2024-08-01 LAB — CBC
HCT: 40.6 % (ref 36.0–46.0)
Hemoglobin: 12.7 g/dL (ref 12.0–15.0)
MCH: 28.8 pg (ref 26.0–34.0)
MCHC: 31.3 g/dL (ref 30.0–36.0)
MCV: 92.1 fL (ref 80.0–100.0)
Platelets: 210 K/uL (ref 150–400)
RBC: 4.41 MIL/uL (ref 3.87–5.11)
RDW: 16.2 % — ABNORMAL HIGH (ref 11.5–15.5)
WBC: 8.7 K/uL (ref 4.0–10.5)
nRBC: 0 % (ref 0.0–0.2)

## 2024-08-01 LAB — BASIC METABOLIC PANEL WITH GFR
Anion gap: 9 (ref 5–15)
BUN: 16 mg/dL (ref 8–23)
CO2: 23 mmol/L (ref 22–32)
Calcium: 8.3 mg/dL — ABNORMAL LOW (ref 8.9–10.3)
Chloride: 102 mmol/L (ref 98–111)
Creatinine, Ser: 1.25 mg/dL — ABNORMAL HIGH (ref 0.44–1.00)
GFR, Estimated: 46 mL/min — ABNORMAL LOW (ref >60)
Glucose, Bld: 140 mg/dL — ABNORMAL HIGH (ref 70–99)
Potassium: 3.7 mmol/L (ref 3.5–5.1)
Sodium: 134 mmol/L — ABNORMAL LOW (ref 135–145)

## 2024-08-01 MED ORDER — PROPARACAINE HCL 0.5 % OP SOLN: 1.0000 [drp] | Freq: Once | OPHTHALMIC | Status: DC

## 2024-08-01 MED ORDER — FLUORESCEIN SODIUM 1 MG OP STRP: 1.0000 | ORAL_STRIP | Freq: Once | OPHTHALMIC | Status: DC

## 2024-08-01 MED ORDER — SODIUM CHLORIDE 0.9 % IV BOLUS
500.0000 mL | Freq: Once | INTRAVENOUS | Status: AC
  Administered 2024-08-01: 500 mL via INTRAVENOUS

## 2024-08-01 MED ORDER — IBUPROFEN 100 MG/5ML PO SUSP
600.0000 mg | Freq: Once | ORAL | Status: DC
Start: 2024-08-02 — End: 2024-08-01

## 2024-08-01 NOTE — ED Triage Notes (Signed)
 Patient to ED via POV for right sided flank pain. Ongoing for a couple of days. States hx of kidney infection. Denies burning with urination or N/V/D.

## 2024-08-01 NOTE — ED Provider Notes (Signed)
 [facility] Provider Note    Event Date/Time   First MD Initiated Contact with Patient 08/01/24 2105     (approximate)   History   Flank Pain (/)   HPI  Elizabeth [name] is a 73 y.o. female with history of hypertension, diabetes, CHF as well as prior kidney stones and UTIs who presents with right flank and low back pain for the last day, worse since last night, somewhat intermittent, improved currently.  She reports some associated nausea but no vomiting.  She has no diarrhea.  She denies any dysuria or frequency.  She has no hematuria.  I reviewed the past medical records.  The patient's most recent outpatient encounter was with Dr. Cindie from cardiology on 7/30 for follow-up of her diastolic CHF.  She has no recent hospitalizations.   Physical Exam   Triage Vital Signs: ED Triage Vitals [08/01/24 1622]  Encounter Vitals Group     BP 139/82     Girls Systolic BP Percentile      Girls Diastolic BP Percentile      Boys Systolic BP Percentile      Boys Diastolic BP Percentile      Pulse Rate (!) 55     Resp 17     Temp 98.9 F (37.2 C)     Temp Source Oral     SpO2 99 %     Weight 171 lb 15.3 oz (78 kg)     Height 5' 3 (1.6 m)     Head Circumference      Peak Flow      Pain Score 7     Pain Loc      Pain Education      Exclude from Growth Chart     Most recent vital signs: Vitals:   08/01/24 2130 08/01/24 2253  BP: 135/88   Pulse: 98   Resp: 17   Temp:  98.2 F (36.8 C)  SpO2: 100%      General: Alert, relatively well-appearing, no distress.  CV:  Good peripheral perfusion.  Resp:  Normal effort.  Abd:  Soft and nontender.  No distention.  Other:  Mild right CVA tenderness.  No midline spinal tenderness.  No rash or skin lesions over the lower back   ED Results / Procedures / Treatments   Labs (all labs ordered are listed, but only abnormal results are displayed) Labs Reviewed  BASIC METABOLIC PANEL WITH GFR - Abnormal;  Notable for the following components:      Result Value   Sodium 134 (*)    Glucose, Bld 140 (*)    Creatinine, Ser 1.25 (*)    Calcium  8.3 (*)    GFR, Estimated 46 (*)    All other components within normal limits  CBC - Abnormal; Notable for the following components:   RDW 16.2 (*)    All other components within normal limits  URINALYSIS, ROUTINE W REFLEX MICROSCOPIC     EKG     RADIOLOGY  CT renal stone study: I independently viewed and interpreted the images; there is no ureteral stone.  Radiology report indicates small right pleural effusion but no acute nominal abnormality.   PROCEDURES:  Critical Care performed: No  Procedures   MEDICATIONS ORDERED IN ED: Medications  sodium chloride  0.9 % bolus 500 mL (0 mLs Intravenous Stopped 08/01/24 2309)     IMPRESSION / MDM / ASSESSMENT AND PLAN / ED COURSE  I reviewed the triage vital signs and  the nursing notes.  73 year old female with PMH as noted above presents with right flank/lower back pain since yesterday.  On exam the patient has mild right CVA tenderness.  Vital signs are normal.  Abdomen is nontender.  Differential diagnosis includes, but is not limited to, ureteral stone, pyelonephritis, muscle strain or spasm.  Patient's presentation is most consistent with acute complicated illness / injury requiring diagnostic workup.  BMP shows kidney disease unchanged from baseline.  CBC is unremarkable.  Will obtain a urinalysis, CT, and reassess.  ----------------------------------------- 11:28 PM on 08/01/2024 -----------------------------------------  CT is negative.  Urinalysis is pending.  I have signed the patient out to the oncoming ED physician Dr. Nicholaus.  FINAL CLINICAL IMPRESSION(S) / ED DIAGNOSES   Final diagnoses:  Right flank pain     Rx / DC Orders   ED Discharge Orders     None        Note:  This document was prepared using Dragon voice recognition software and may include  unintentional dictation errors.    Jacolyn Pae, MD 08/01/24 2328

## 2024-08-02 LAB — URINALYSIS, ROUTINE W REFLEX MICROSCOPIC
Bilirubin Urine: NEGATIVE
Glucose, UA: >=500 mg/dL — AB
Ketones, ur: NEGATIVE mg/dL
Nitrite: POSITIVE — AB
Protein, ur: NEGATIVE mg/dL
Specific Gravity, Urine: 1.012 (ref 1.005–1.030)
WBC, UA: >50 WBC/hpf (ref 0–5)
pH: 5 (ref 5.0–8.0)

## 2024-08-02 MED ORDER — ONDANSETRON 4 MG PO TBDP: 4.0000 mg | ORAL_TABLET | Freq: Three times a day (TID) | ORAL | 0 refills | Status: DC | PRN

## 2024-08-02 MED ORDER — SODIUM CHLORIDE 0.9 % IV SOLN
1.0000 g | Freq: Once | INTRAVENOUS | Status: AC
  Administered 2024-08-02: 1 g via INTRAVENOUS
  Filled 2024-08-02: qty 10

## 2024-08-02 MED ORDER — CEPHALEXIN 500 MG PO CAPS: 500.0000 mg | ORAL_CAPSULE | Freq: Four times a day (QID) | ORAL | 0 refills | Status: DC

## 2024-08-02 MED ORDER — CEPHALEXIN 500 MG PO CAPS: 500.0000 mg | ORAL_CAPSULE | Freq: Four times a day (QID) | ORAL | 0 refills | Status: AC

## 2024-08-02 MED ORDER — OXYCODONE HCL 5 MG PO TABS: 5.0000 mg | ORAL_TABLET | Freq: Four times a day (QID) | ORAL | 0 refills | Status: AC | PRN

## 2024-08-02 NOTE — Discharge Instructions (Addendum)
 You was seen in our emergency department for right flank pain.  Workup today was reassuring but your urine was consistent with a UTI.  I have sent your urine for culture and you will receive a call if your antibiotic needs to be changed.  In the interim please ensure adequate hydration.  Call your primary care physician and arrange follow-up as soon as possible.  Return if any acutely worsening symptoms or any other emergency.  It was very nice meeting you and I wish you the best of luck. -- RETURN PRECAUTIONS & AFTERCARE: (ENGLISH) RETURN PRECAUTIONS: Return immediately to the emergency department or see/call your doctor if you feel worse, weak or have changes in speech or vision, are short of breath, have fever, vomiting, pain, bleeding or dark stool, trouble urinating or any new issues. Return here or see/call your doctor if not improving as expected for your suspected condition. FOLLOW-UP CARE: Call your doctor and/or any doctors we referred you to for more advice and to make an appointment. Do this today, tomorrow or after the weekend. Some doctors only take PPO insurance so if you have HMO insurance you may want to contact your HMO or your regular doctor for referral to a specialist within your plan. Either way tell the doctor's office that it was a referral from the emergency department so you get the soonest possible appointment.  YOUR TEST RESULTS: Take result reports of any blood or urine tests, imaging tests and EKG's to your doctor and any referral doctor. Have any abnormal tests repeated. Your doctor or a referral doctor can let you know when this should be done. Also make sure your doctor contacts this hospital to get any test results that are not currently available such as cultures or special tests for infection and final imaging reports, which are often not available at the time you leave the ER but which may list additional important findings that are not documented on the preliminary  report. BLOOD PRESSURE: If your blood pressure was greater than 120/80 have your blood pressure rechecked within 1 to 2 weeks. MEDICATION SIDE EFFECTS: Do not drive, walk, bike, take the bus, etc. if you have received or are being prescribed any sedating medications such as those for pain or anxiety or certain antihistamines like Benadryl . If you have been give one of these here get a taxi home or have a friend drive you home. Ask your pharmacist to counsel you on potential side effects of any new medication

## 2024-08-02 NOTE — ED Provider Notes (Signed)
 Clinical Course as of 08/02/24 0654  Wed Aug 01, 2024  2306 Received signout: Right flank pain. No Stone on Ct. Waiting for day.  [HD]  2340 Creatinine(!): 1.25  creatinine is better than her previous values [HD]  Thu Aug 02, 2024  0012 Patient urinated [HD]  0040 Nitrite(!): POSITIVE Consistent with UTI [HD]  0041 No urine cultures have grown out anything in the past.  Will add on a urine culture and give ceftriaxone  [HD]  0130 Patient reassessed and she feels improved.  I have ordered her with ceftriaxone .  She feels comfortable returning home and following up with her primary care physician.  Repeat abdominal exam is completely benign and she has no CVA tenderness [HD]    Clinical Course User Index [HD] Nicholaus Rolland BRAVO, MD   Patient discharged with 7 days of Keflex  and given diagnosis of possible early pyelonephritis.  She will return with any acutely worsening symptoms or any other emergency  At time of discharge there is no evidence of acute life, limb, vision, or fertility threat. Patient has stable vital signs, pain is well controlled, patient is ambulatory and p.o. tolerant.  Discharge instructions were completed using the EPIC system. I would refer you to those at this time. All warnings prescriptions follow-up etc. were discussed in detail with the patient. Patient indicates understanding and is agreeable with this plan. All questions answered.  Patient is made aware that they may return to the emergency department for any worsening or new condition or for any other emergency.    Nicholaus Rolland BRAVO, MD 08/02/24 [phone]

## 2024-08-04 LAB — URINE CULTURE: Culture: >=100000 — AB

## 2024-08-09 ENCOUNTER — Ambulatory Visit

## 2024-08-09 NOTE — Progress Notes (Deleted)
 New patient visit   Patient: Elizabeth [name]   DOB: 12-11-1950   73 y.o. Female  MRN: [id number] Visit Date: 08/09/2024  Today's healthcare provider: Isaiah DELENA Pepper, MD   No chief complaint on file.  Subjective    Elizabeth [name] is a 73 y.o. female who presents today as a new patient to establish care.   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  Afib with slow ventricular rates - previously evaluated for pacemaker, no pacemaker needed per EP -takes eliquis   HTN  HLD - takes spiro - has not been on lisinopril  - takes pravastatin   Hx of CVA - cerebellar stroke in 2024  T2DM - on farxiga   Chronic venous insufficiency  Lymphedema - wearing compression - follows with [facility]  Depression - takes lexapro   Hx of partial seizure in Dec 2024 - started Onfi  (clobazam )  Breast cancer (s/p of right lumpectomy and radiation therapy)   Past Medical History:  Diagnosis Date   Cancer [facility])    breast   Carcinoma of right breast (HCC) 05/20/2014   Diabetes mellitus without complication (HCC)    Hypertension    Lymphedema    Personal history of radiation therapy 2015   Right breast   PONV (postoperative nausea and vomiting)    Psoriasis 1990   Stroke (HCC) 2000   residual left arm weakness   Varicose veins    Past Surgical History:  Procedure Laterality Date   AMPUTATION Left 03/04/2021   Procedure: AMPUTATION RAY;  Surgeon: Neill Boas, DPM;  Location: [facility];  Service: Podiatry;  Laterality: Left;   AMPUTATION TOE Right 01/25/2017   Procedure: AMPUTATION TOE   ;  Surgeon: Donnice Cory, DPM;  Location: [facility];  Service: Podiatry;  Laterality: Right;   BREAST EXCISIONAL BIOPSY Right 2015   [facility] / with radiation   BREAST LUMPECTOMY Right 04-2014   followed by radiation,  no chemo   BREAST REDUCTION  SURGERY  1977   CHOLECYSTECTOMY N/A 09/23/2015   Procedure: LAPAROSCOPIC CHOLECYSTECTOMY;  Surgeon: Thom CHRISTELLA Pin, MD;  Location: [facility];  Service: General;  Laterality: N/A;   DILATION AND CURETTAGE OF UTERUS  2010   EYE SURGERY Left 2015   cataracts and left eye detached retina repair   EYE SURGERY Right 2013   cataract with len implant   GASTRIC ROUX-EN-Y N/A 09/23/2015   Procedure: LAPAROSCOPIC ROUX-EN-Y GASTRIC;  Surgeon: Thom CHRISTELLA Pin, MD;  Location: [facility];  Service: General;  Laterality: N/A;   HIATAL HERNIA REPAIR N/A 09/23/2015   Procedure: LAPAROSCOPIC REPAIR OF HIATAL HERNIA;  Surgeon: Thom CHRISTELLA Pin, MD;  Location: [facility];  Service: General;  Laterality: N/A;   IRRIGATION AND DEBRIDEMENT FOOT Right 01/25/2017   Procedure: IRRIGATION AND DEBRIDEMENT FOOT;  Surgeon: Donnice Cory, DPM;  Location: [facility];  Service: Podiatry;  Laterality: Right;   IRRIGATION AND DEBRIDEMENT FOOT Right 01/29/2017   Procedure: IRRIGATION AND DEBRIDEMENT FOOT and application of wound vac;  Surgeon: Donnice Cory, DPM;  Location: [facility];  Service: Podiatry;  Laterality: Right;   LOWER EXTREMITY ANGIOGRAPHY Right 01/28/2017   Procedure: Lower Extremity Angiography;  Surgeon: Cordella KANDICE Shawl, MD;  Location: [facility];  Service: Cardiovascular;  Laterality: Right;   LOWER EXTREMITY ANGIOGRAPHY Left 03/06/2021   Procedure: Lower Extremity Angiography;  Surgeon: Shawl Cordella  G, MD;  Location: [facility];  Service: Cardiovascular;  Laterality: Left;   LOWER EXTREMITY INTERVENTION  01/28/2017   Procedure: Lower Extremity Intervention;  Surgeon: Cordella KANDICE Shawl, MD;  Location: [facility];  Service: Cardiovascular;;   REDUCTION MAMMAPLASTY     Family Status  Relation Name Status   Mother  Alive   Father  Deceased at age 63s       ulcers, CVA   Sister  (Not Specified)   Brother  (Not Specified)  No partnership data on file   Family History  Problem Relation Age of  Onset   Diabetes Mother    Cancer Mother    Breast cancer Mother 37   Diabetes Father    Diabetes Sister    Diabetes Brother    Social History   Socioeconomic History   Marital status: Married    Spouse name: Norman Mountain   Number of children: Not on file   Years of education: Not on file   Highest education level: Not on file  Occupational History   Not on file  Tobacco Use   Smoking status: Never    Passive exposure: Never   Smokeless tobacco: Never  Vaping Use   Vaping status: Never Used  Substance and Sexual Activity   Alcohol use: No    Alcohol/week: 0.0 standard drinks of alcohol   Drug use: No   Sexual activity: Yes    Birth control/protection: Post-menopausal  Other Topics Concern   Not on file  Social History Narrative   Not on file   Social Drivers of Health   Financial Resource Strain: Not on file  Food Insecurity: No Food Insecurity (02/09/2024)   Hunger Vital Sign    Worried About Running Out of Food in the Last Year: Never true    Ran Out of Food in the Last Year: Never true  Transportation Needs: No Transportation Needs (02/09/2024)   PRAPARE - Administrator, Civil Service (Medical): No    Lack of Transportation (Non-Medical): No  Physical Activity: Not on file  Stress: Not on file  Social Connections: Moderately Integrated (02/09/2024)   Social Connection and Isolation Panel    Frequency of Communication with Friends and Family: Once a week    Frequency of Social Gatherings with Friends and Family: Once a week    Attends Religious Services: 1 to 4 times per year    Active Member of [employer] or Organizations: No    Attends Engineer, structural: 1 to 4 times per year    Marital Status: Married   Outpatient Medications Prior to Visit  Medication Sig   acetaminophen  (TYLENOL ) 325 MG tablet Take 2 tablets (650 mg total) by mouth every 6 (six) hours as needed for mild pain (or Fever >/= 101).   apixaban  (ELIQUIS ) 5 MG TABS tablet  Take 1 tablet (5 mg total) by mouth 2 (two) times daily.   Bacillus Coagulans-Inulin  (PROBIOTIC) 1-250 BILLION-MG CAPS Take 2 capsules by mouth 3 (three) times daily.   Calcium  Carb-Cholecalciferol  (CALCIUM  1000 + D PO) Take 1 tablet by mouth daily.   cephALEXin  (KEFLEX ) 500 MG capsule Take 1 capsule (500 mg total) by mouth 4 (four) times daily for 7 days.   clopidogrel  (PLAVIX ) 75 MG tablet Take 75 mg by mouth daily.   dapagliflozin  propanediol (FARXIGA ) 10 MG TABS tablet Take 1 tablet (10 mg total) by mouth daily.   escitalopram  (LEXAPRO ) 10 MG tablet Take 10 mg by  mouth daily.   furosemide  (LASIX ) 40 MG tablet Take 1 tablet (40 mg total) by mouth 2 (two) times daily.   nystatin  (MYCOSTATIN /NYSTOP ) powder Apply topically 2 (two) times daily.   ondansetron  (ZOFRAN -ODT) 4 MG disintegrating tablet Take 1 tablet (4 mg total) by mouth every 8 (eight) hours as needed for nausea or vomiting.   potassium chloride  (KLOR-CON ) 10 MEQ tablet Take 2 tablets (20 mEq total) by mouth daily.   pravastatin  (PRAVACHOL ) 40 MG tablet Take 1 tablet (40 mg total) by mouth daily. In afternoon   spironolactone  (ALDACTONE ) 25 MG tablet Take 1 tablet (25 mg total) by mouth daily.   Tafamidis  Meglumine , Cardiac, 20 MG CAPS Take 4 capsules (80 mg total) by mouth daily.   TRUE METRIX BLOOD GLUCOSE TEST test strip    TRUEPLUS LANCETS 33G MISC    No facility-administered medications prior to visit.   Allergies  Allergen Reactions   Levaquin [Levofloxacin] Other (See Comments)    Partial seizure    Reviews of Systems as noted in HPI.  {Insert previous labs (optional):23779} {See past labs  Heme  Chem  Endocrine  Serology  Results Review (optional):1}   Objective    There were no vitals taken for this visit. {Insert last BP/Wt (optional):23777}{See vitals history (optional):1}   Physical Exam  Depression Screen     No data to display         No results found for any visits on 08/09/24.  Assessment  & Plan      Problem List Items Addressed This Visit   None   Assessment and Plan              No follow-ups on file.      Isaiah DELENA Pepper, MD  [facility] [phone] (phone) [phone] (fax)

## 2024-09-13 ENCOUNTER — Other Ambulatory Visit: Payer: Self-pay

## 2024-09-13 ENCOUNTER — Emergency Department
Admission: EM | Admit: 2024-09-13 | Discharge: 2024-09-13 | Disposition: A | Discharge status: Home / Self Care | Attending: Emergency Medicine | Admitting: Emergency Medicine

## 2024-09-13 ENCOUNTER — Ambulatory Visit

## 2024-09-13 ENCOUNTER — Emergency Department

## 2024-09-13 DIAGNOSIS — R531 Weakness: Secondary | ICD-10-CM

## 2024-09-13 DIAGNOSIS — I509 Heart failure, unspecified: Secondary | ICD-10-CM | POA: Diagnosis not present

## 2024-09-13 DIAGNOSIS — M7989 Other specified soft tissue disorders: Secondary | ICD-10-CM | POA: Diagnosis present

## 2024-09-13 LAB — BRAIN NATRIURETIC PEPTIDE: B Natriuretic Peptide: 242.7 pg/mL — ABNORMAL HIGH (ref 0.0–100.0)

## 2024-09-13 LAB — COMPREHENSIVE METABOLIC PANEL WITH GFR
ALT: <5 U/L (ref 0–44)
AST: 11 U/L — ABNORMAL LOW (ref 15–41)
Albumin: 3.2 g/dL — ABNORMAL LOW (ref 3.5–5.0)
Alkaline Phosphatase: 81 U/L (ref 38–126)
Anion gap: 9 (ref 5–15)
BUN: 16 mg/dL (ref 8–23)
CO2: 25 mmol/L (ref 22–32)
Calcium: 8.8 mg/dL — ABNORMAL LOW (ref 8.9–10.3)
Chloride: 105 mmol/L (ref 98–111)
Creatinine, Ser: 1.17 mg/dL — ABNORMAL HIGH (ref 0.44–1.00)
GFR, Estimated: 50 mL/min — ABNORMAL LOW (ref >60)
Glucose, Bld: 123 mg/dL — ABNORMAL HIGH (ref 70–99)
Potassium: 4.3 mmol/L (ref 3.5–5.1)
Sodium: 139 mmol/L (ref 135–145)
Total Bilirubin: 1.1 mg/dL (ref 0.0–1.2)
Total Protein: 6.7 g/dL (ref 6.5–8.1)

## 2024-09-13 LAB — CBC
HCT: 40 % (ref 36.0–46.0)
Hemoglobin: 12.5 g/dL (ref 12.0–15.0)
MCH: 29.3 pg (ref 26.0–34.0)
MCHC: 31.3 g/dL (ref 30.0–36.0)
MCV: 93.7 fL (ref 80.0–100.0)
Platelets: 250 K/uL (ref 150–400)
RBC: 4.27 MIL/uL (ref 3.87–5.11)
RDW: 15.1 % (ref 11.5–15.5)
WBC: 9.5 K/uL (ref 4.0–10.5)
nRBC: 0 % (ref 0.0–0.2)

## 2024-09-13 MED ORDER — FUROSEMIDE 10 MG/ML IJ SOLN
40.0000 mg | Freq: Once | INTRAMUSCULAR | Status: DC
  Filled 2024-09-13: qty 4

## 2024-09-13 MED ORDER — FUROSEMIDE 40 MG PO TABS
80.0000 mg | ORAL_TABLET | Freq: Once | ORAL | Status: AC
  Administered 2024-09-13: 80 mg via ORAL
  Filled 2024-09-13: qty 2

## 2024-09-13 MED ORDER — FUROSEMIDE 40 MG PO TABS: 40.0000 mg | ORAL_TABLET | Freq: Two times a day (BID) | ORAL | 0 refills | Status: DC

## 2024-09-13 NOTE — Progress Notes (Deleted)
 New patient visit   Patient: Elizabeth [name]   DOB: Jan 02, 1951   73 y.o. Female  MRN: [id number] Visit Date: 09/13/2024  Today's healthcare provider: Isaiah DELENA Pepper, MD   No chief complaint on file.  Subjective    Elizabeth [name] is a 73 y.o. female who presents today as a new patient to establish care.   Discussed the use of AI scribe software for clinical note transcription with the patient, who gave verbal consent to proceed.  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 - Afib -HTN - CKD - hx of stroke - PVD, lypmhedemea - has lymph pump - T2DM  Past Medical History:  Diagnosis Date   Cancer (HCC)    breast   Carcinoma of right breast (HCC) 05/20/2014   Diabetes mellitus without complication (HCC)    Hypertension    Lymphedema    Personal history of radiation therapy 2015   Right breast   PONV (postoperative nausea and vomiting)    Psoriasis 1990   Stroke (HCC) 2000   residual left arm weakness   Varicose veins    Past Surgical History:  Procedure Laterality Date   AMPUTATION Left 03/04/2021   Procedure: AMPUTATION RAY;  Surgeon: Neill Boas, DPM;  Location: [facility];  Service: Podiatry;  Laterality: Left;   AMPUTATION TOE Right 01/25/2017   Procedure: AMPUTATION TOE   ;  Surgeon: Donnice Cory, DPM;  Location: [facility];  Service: Podiatry;  Laterality: Right;   BREAST EXCISIONAL BIOPSY Right 2015   [facility] / with radiation   BREAST LUMPECTOMY Right 04-2014   followed by radiation,  no chemo   BREAST REDUCTION SURGERY  1977   CHOLECYSTECTOMY N/A 09/23/2015   Procedure: LAPAROSCOPIC CHOLECYSTECTOMY;  Surgeon: Thom CHRISTELLA Pin, MD;  Location: [facility];  Service: General;  Laterality: N/A;   DILATION AND CURETTAGE OF UTERUS  2010   EYE SURGERY Left 2015   cataracts and left eye detached retina repair   EYE SURGERY Right 2013   cataract with len implant   GASTRIC ROUX-EN-Y N/A 09/23/2015   Procedure: LAPAROSCOPIC ROUX-EN-Y GASTRIC;   Surgeon: Thom CHRISTELLA Pin, MD;  Location: [facility];  Service: General;  Laterality: N/A;   HIATAL HERNIA REPAIR N/A 09/23/2015   Procedure: LAPAROSCOPIC REPAIR OF HIATAL HERNIA;  Surgeon: Thom CHRISTELLA Pin, MD;  Location: [facility];  Service: General;  Laterality: N/A;   IRRIGATION AND DEBRIDEMENT FOOT Right 01/25/2017   Procedure: IRRIGATION AND DEBRIDEMENT FOOT;  Surgeon: Donnice Cory, DPM;  Location: [facility];  Service: Podiatry;  Laterality: Right;   IRRIGATION AND DEBRIDEMENT FOOT Right 01/29/2017   Procedure: IRRIGATION AND DEBRIDEMENT FOOT and application of wound vac;  Surgeon: Donnice Cory, DPM;  Location: [facility];  Service: Podiatry;  Laterality: Right;   LOWER EXTREMITY ANGIOGRAPHY Right 01/28/2017   Procedure: Lower Extremity Angiography;  Surgeon: Cordella KANDICE Shawl, MD;  Location: [facility];  Service: Cardiovascular;  Laterality: Right;   LOWER EXTREMITY ANGIOGRAPHY Left 03/06/2021   Procedure: Lower Extremity Angiography;  Surgeon: Shawl Cordella KANDICE, MD;  Location: [facility];  Service: Cardiovascular;  Laterality: Left;   LOWER EXTREMITY INTERVENTION  01/28/2017   Procedure: Lower Extremity Intervention;  Surgeon: Cordella KANDICE Shawl, MD;  Location: [facility];  Service: Cardiovascular;;   REDUCTION MAMMAPLASTY     Family Status  Relation Name Status   Mother  Alive   Father  Deceased at age 58s  ulcers, CVA   Sister  (Not Specified)   Brother  (Not Specified)  No partnership data on file   Family History  Problem Relation Age of Onset   Diabetes Mother    Cancer Mother    Breast cancer Mother 33   Diabetes Father    Diabetes Sister    Diabetes Brother    Social History   Socioeconomic History   Marital status: Married    Spouse name: Norman Mountain   Number of children: Not on file   Years of education: Not on file   Highest education level: Not on file  Occupational History   Not on file  Tobacco Use   Smoking status: Never     Passive exposure: Never   Smokeless tobacco: Never  Vaping Use   Vaping status: Never Used  Substance and Sexual Activity   Alcohol use: No    Alcohol/week: 0.0 standard drinks of alcohol   Drug use: No   Sexual activity: Yes    Birth control/protection: Post-menopausal  Other Topics Concern   Not on file  Social History Narrative   Not on file   Social Drivers of Health   Financial Resource Strain: Not on file  Food Insecurity: No Food Insecurity (02/09/2024)   Hunger Vital Sign    Worried About Running Out of Food in the Last Year: Never true    Ran Out of Food in the Last Year: Never true  Transportation Needs: No Transportation Needs (02/09/2024)   PRAPARE - Administrator, Civil Service (Medical): No    Lack of Transportation (Non-Medical): No  Physical Activity: Not on file  Stress: Not on file  Social Connections: Moderately Integrated (02/09/2024)   Social Connection and Isolation Panel    Frequency of Communication with Friends and Family: Once a week    Frequency of Social Gatherings with Friends and Family: Once a week    Attends Religious Services: 1 to 4 times per year    Active Member of [employer] or Organizations: No    Attends Engineer, Structural: 1 to 4 times per year    Marital Status: Married   Outpatient Medications Prior to Visit  Medication Sig   acetaminophen  (TYLENOL ) 325 MG tablet Take 2 tablets (650 mg total) by mouth every 6 (six) hours as needed for mild pain (or Fever >/= 101).   apixaban  (ELIQUIS ) 5 MG TABS tablet Take 1 tablet (5 mg total) by mouth 2 (two) times daily.   Bacillus Coagulans-Inulin  (PROBIOTIC) 1-250 BILLION-MG CAPS Take 2 capsules by mouth 3 (three) times daily.   Calcium  Carb-Cholecalciferol  (CALCIUM  1000 + D PO) Take 1 tablet by mouth daily.   clopidogrel  (PLAVIX ) 75 MG tablet Take 75 mg by mouth daily.   dapagliflozin  propanediol (FARXIGA ) 10 MG TABS tablet Take 1 tablet (10 mg total) by mouth daily.    escitalopram  (LEXAPRO ) 10 MG tablet Take 10 mg by mouth daily.   furosemide  (LASIX ) 40 MG tablet Take 1 tablet (40 mg total) by mouth 2 (two) times daily.   nystatin  (MYCOSTATIN /NYSTOP ) powder Apply topically 2 (two) times daily.   ondansetron  (ZOFRAN -ODT) 4 MG disintegrating tablet Take 1 tablet (4 mg total) by mouth every 8 (eight) hours as needed for nausea or vomiting.   potassium chloride  (KLOR-CON ) 10 MEQ tablet Take 2 tablets (20 mEq total) by mouth daily.   pravastatin  (PRAVACHOL ) 40 MG tablet Take 1 tablet (40 mg total) by mouth daily. In afternoon   spironolactone  (ALDACTONE )  25 MG tablet Take 1 tablet (25 mg total) by mouth daily.   Tafamidis  Meglumine , Cardiac, 20 MG CAPS Take 4 capsules (80 mg total) by mouth daily.   TRUE METRIX BLOOD GLUCOSE TEST test strip    TRUEPLUS LANCETS 33G MISC    No facility-administered medications prior to visit.   Allergies  Allergen Reactions   Levaquin [Levofloxacin] Other (See Comments)    Partial seizure    Reviews of Systems as noted in HPI.  {Insert previous labs (optional):23779} {See past labs  Heme  Chem  Endocrine  Serology  Results Review (optional):1}   Objective    There were no vitals taken for this visit. {Insert last BP/Wt (optional):23777}{See vitals history (optional):1}   Physical Exam  Depression Screen     No data to display         No results found for any visits on 09/13/24.  Assessment & Plan      Problem List Items Addressed This Visit   None   Assessment and Plan Assessment & Plan      No follow-ups on file.      Isaiah DELENA Pepper, MD  [facility] [phone] (phone) [phone] (fax)

## 2024-09-13 NOTE — ED Provider Notes (Signed)
 [facility] Provider Note    Event Date/Time   First MD Initiated Contact with Patient 09/13/24 1804     (approximate)   History   Weakness   HPI  Elizabeth [name] is a 73 year old female presenting to the ER for evaluation of weakness. Recent URI symptoms which are now improved. Also reports recently running of CHF medications about a week ago.  Denies any chest pain or shortness of breath.  Tells me that she has not noticed that her legs are more swollen than usual.  Denies vomiting, abdominal pain, dysuria.  Reviewed office visit with heart failure team from 06/06/2024.  At that time patient was seen with stable A-fib rates, improving volume status.  Did note that exam is limited due to contribution from her lymphedema.      Physical Exam   Triage Vital Signs: ED Triage Vitals [09/13/24 1542]  Encounter Vitals Group     BP (!) 157/77     Girls Systolic BP Percentile      Girls Diastolic BP Percentile      Boys Systolic BP Percentile      Boys Diastolic BP Percentile      Pulse Rate 63     Resp 18     Temp 98.1 F (36.7 C)     Temp Source Oral     SpO2 94 %     Weight      Height      Head Circumference      Peak Flow      Pain Score 5     Pain Loc      Pain Education      Exclude from Growth Chart     Most recent vital signs: Vitals:   09/13/24 1806 09/13/24 1852  BP:  (!) 155/57  Pulse:  74  Resp:  18  Temp:  98.3 F (36.8 C)  SpO2: 94% 96%     General: Awake, interactive  CV:  Good peripheral perfusion Resp:  Unlabored respirations, lung sounds mildly coarse bilaterally Abd:  Nondistended, soft, nontender Neuro:  Symmetric facial movement, fluid speech, 5 of 5 strength of the bilateral upper and lower extremities Other:   Bilateral lower extremity swelling with pitting edema   ED Results / Procedures / Treatments   Labs (all labs ordered are listed, but only abnormal results are displayed) Labs Reviewed   COMPREHENSIVE METABOLIC PANEL WITH GFR - Abnormal; Notable for the following components:      Result Value   Glucose, Bld 123 (*)    Creatinine, Ser 1.17 (*)    Calcium  8.8 (*)    Albumin 3.2 (*)    AST 11 (*)    GFR, Estimated 50 (*)    All other components within normal limits  BRAIN NATRIURETIC PEPTIDE - Abnormal; Notable for the following components:   B Natriuretic Peptide 242.7 (*)    All other components within normal limits  CBC  CBG MONITORING, ED     EKG EKG independently reviewed and interpreted by myself demonstrates:  EKG demonstrates A-fib at a rate of 65, QRS 82, QTc 411, no acute ST changes  RADIOLOGY Imaging independently reviewed and interpreted by myself demonstrates:  CXR with mild pulmonary edema  Formal Radiology Read:  DG Chest Portable 1 View Result Date: 09/13/2024 CLINICAL DATA:  Weakness, inability to ambulate, low back pain EXAM: PORTABLE CHEST 1 VIEW COMPARISON:  04/27/2024 FINDINGS: Single frontal view of the chest demonstrates an enlarged cardiac  silhouette. There is increased pulmonary vascular congestion with mild interstitial prominence consistent with developing edema. Trace right pleural effusion. No pneumothorax. No acute bony abnormalities. IMPRESSION: 1. Findings consistent with mild congestive heart failure. Electronically Signed   By: Ozell Daring M.D.   On: 09/13/2024 18:53    PROCEDURES:  Critical Care performed: No  Procedures   MEDICATIONS ORDERED IN ED: Medications  furosemide  (LASIX ) tablet 80 mg (80 mg Oral Given 09/13/24 2044)     IMPRESSION / MDM / ASSESSMENT AND PLAN / ED COURSE  I reviewed the triage vital signs and the nursing notes.  Differential diagnosis includes, but is not limited to, CHF exacerbation, anemia, electrolyte abnormality, arrhythmia, pneumonia  Patient's presentation is most consistent with acute presentation with potential threat to life or bodily function.  73 year old female presenting to  the emergency department for evaluation of generalized weakness.  Stable vitals on presentation.  Reassuring CBC, CMP.  BNP slightly elevated but actually decreased from priors.  X-Marcie Shearon with mild edema with denying shortness of breath.  No hypoxia.  With her weakness, I did consider and discussed admission.  Patient reports that her weakness is actually longstanding but has just been slightly worse over the past few days.  She would prefer to be discharged home.  Does report that she will fill her heart failure medications and start taking these.  I do note that her Lasix  expired in September, so we will send refill for this.  Suspect mild volume overload here, given double dose of her home Lasix .  In the absence of hypoxia or focal weakness suggestive of acute process, do think patient stable for discharge home.  Will place referral to heart failure team.  Strict return precautions provided.  Patient discharged in stable condition.     FINAL CLINICAL IMPRESSION(S) / ED DIAGNOSES   Final diagnoses:  Generalized weakness  Acute on chronic congestive heart failure, unspecified heart failure type [facility])     Rx / DC Orders   ED Discharge Orders          Ordered    AMB referral to CHF clinic        09/13/24 2111             Note:  This document was prepared using Dragon voice recognition software and may include unintentional dictation errors.   Levander Slate, MD 09/13/24 2111

## 2024-09-13 NOTE — ED Triage Notes (Signed)
 Patient's husband states generalized weakness and unable to ambulate x one week. Patient reports low back pain and has no other complaints.

## 2024-09-13 NOTE — Discharge Instructions (Signed)
 You were seen in the emergency department today for evaluation of your weakness.  I do think this partly may be related to worsening heart failure.  I sent that new prescription for your Lasix  to your pharmacy.  Please take this as directed.  Please follow-up with a heart failure team for further evaluation.  Return to the ER for any new or worsening symptoms.

## 2024-09-20 ENCOUNTER — Other Ambulatory Visit (HOSPITAL_COMMUNITY): Payer: Self-pay

## 2024-09-21 ENCOUNTER — Telehealth: Payer: Self-pay | Admitting: Family

## 2024-09-21 NOTE — Telephone Encounter (Signed)
 Called to confirm/remind patient of their appointment at the [facility] on 09/24/24.   Appointment:   [x] Confirmed  [] Left mess   [] No answer/No voice mail  [] VM Full/unable to leave message  [] Phone not in service  Patient reminded to bring all medications and/or complete list.  Confirmed patient has transportation. Gave directions, instructed to utilize valet parking.

## 2024-09-24 ENCOUNTER — Ambulatory Visit: Attending: Internal Medicine | Admitting: Internal Medicine

## 2024-09-24 VITALS — BP 131/68 | HR 55 | Wt 163.4 lb

## 2024-09-24 DIAGNOSIS — I5032 Chronic diastolic (congestive) heart failure: Secondary | ICD-10-CM | POA: Insufficient documentation

## 2024-09-24 DIAGNOSIS — I4821 Permanent atrial fibrillation: Secondary | ICD-10-CM | POA: Diagnosis not present

## 2024-09-24 DIAGNOSIS — I872 Venous insufficiency (chronic) (peripheral): Secondary | ICD-10-CM | POA: Diagnosis not present

## 2024-09-24 DIAGNOSIS — Z79899 Other long term (current) drug therapy: Secondary | ICD-10-CM | POA: Diagnosis not present

## 2024-09-24 DIAGNOSIS — Z7901 Long term (current) use of anticoagulants: Secondary | ICD-10-CM | POA: Diagnosis not present

## 2024-09-24 DIAGNOSIS — I6522 Occlusion and stenosis of left carotid artery: Secondary | ICD-10-CM | POA: Insufficient documentation

## 2024-09-24 DIAGNOSIS — I482 Chronic atrial fibrillation, unspecified: Secondary | ICD-10-CM | POA: Diagnosis not present

## 2024-09-24 DIAGNOSIS — N1832 Chronic kidney disease, stage 3b: Secondary | ICD-10-CM | POA: Diagnosis not present

## 2024-09-24 DIAGNOSIS — R531 Weakness: Secondary | ICD-10-CM | POA: Diagnosis not present

## 2024-09-24 MED ORDER — APIXABAN 5 MG PO TABS: 5.0000 mg | ORAL_TABLET | Freq: Two times a day (BID) | ORAL | 3 refills | Status: DC

## 2024-09-24 MED ORDER — POTASSIUM CHLORIDE ER 10 MEQ PO TBCR: 20.0000 meq | EXTENDED_RELEASE_TABLET | Freq: Every day | ORAL | 6 refills | Status: DC

## 2024-09-24 MED ORDER — FUROSEMIDE 40 MG PO TABS: 40.0000 mg | ORAL_TABLET | Freq: Two times a day (BID) | ORAL | 6 refills | Status: DC

## 2024-09-24 NOTE — Progress Notes (Signed)
 Pt at OV with husband, both state pt has been out of meds for a couple of weeks, except for Furosemide  and KCL. They state refills need to be sent to [facility].

## 2024-09-24 NOTE — Progress Notes (Signed)
 ADVANCED HEART FAILURE FOLLOW UP CLINIC NOTE  Referring Physician: Levander Slate, MD  Primary Care: [place] HERO, MD Primary Cardiologist:  HPI: Elizabeth [name] is a 73 y.o. female who presents for follow up of chronic diastolic HF.   First seen by cardiology in 07/2021 after having been admitted to the hospital in 04/2021 for TIA. Echo during that admission showed an EF of 60 to 65%, no regional wall motion abnormalities, normal LV diastolic function parameters, normal RV systolic function and ventricular cavity size, mild mitral regurgitation, and negative bubble study. Carotid artery ultrasound showed right greater than left carotid bifurcation atherosclerosis with less than 50% stenosis. Subsequent outpatient cardiac monitoring showed a predominant rhythm of sinus with first-degree AV block, second-degree AV block type I, 2 pauses with the longest being 3.4 seconds, rare atrial and ventricular ectopy, and no evidence of A-fib or flutter.   She was admitted in 06/2023 for a syncopal episode, was found to be in slow atrial fibrillation. No clear etiology for her stroke was identified. She was seen by EP with future plan for cardioversion and PPM placement, but she was found to be in heart failure so was referred to HF clinic. She was markedly volume overloaded but improving in clinic. She underwent a PYP scan that was read as grade 2. ( I have reviewed and felt this was negative)  SUBJECTIVE:  Here with her husband. Says her breathing is ok. Essentially bedbound due to weakness. Said she had swelling in her legs but it is a lot better now after restarting fluid pills.. No orthopnea or PND. Out of all meds except lasix  and Kcl.   PMH, current medications, allergies, social history, and family history reviewed in epic.   Current Outpatient Medications:    acetaminophen  (TYLENOL ) 325 MG tablet, Take 2 tablets (650 mg total) by mouth every 6 (six) hours as needed for mild pain (or Fever >/= 101).,  Disp: , Rfl:    furosemide  (LASIX ) 40 MG tablet, Take 1 tablet (40 mg total) by mouth 2 (two) times daily., Disp: 60 tablet, Rfl: 0   potassium chloride  (KLOR-CON ) 10 MEQ tablet, Take 2 tablets (20 mEq total) by mouth daily., Disp: 180 tablet, Rfl: 3   apixaban  (ELIQUIS ) 5 MG TABS tablet, Take 1 tablet (5 mg total) by mouth 2 (two) times daily. (Patient not taking: Reported on 09/24/2024), Disp: 180 tablet, Rfl: 3   Bacillus Coagulans-Inulin  (PROBIOTIC) 1-250 BILLION-MG CAPS, Take 2 capsules by mouth 3 (three) times daily. (Patient not taking: Reported on 09/24/2024), Disp: 30 capsule, Rfl: 0   Calcium  Carb-Cholecalciferol  (CALCIUM  1000 + D PO), Take 1 tablet by mouth daily. (Patient not taking: Reported on 09/24/2024), Disp: , Rfl:    clopidogrel  (PLAVIX ) 75 MG tablet, Take 75 mg by mouth daily. (Patient not taking: Reported on 09/24/2024), Disp: , Rfl:    dapagliflozin  propanediol (FARXIGA ) 10 MG TABS tablet, Take 1 tablet (10 mg total) by mouth daily. (Patient not taking: Reported on 09/24/2024), Disp: 30 tablet, Rfl: 1   escitalopram  (LEXAPRO ) 10 MG tablet, Take 10 mg by mouth daily. (Patient not taking: Reported on 09/24/2024), Disp: , Rfl:    nystatin  (MYCOSTATIN /NYSTOP ) powder, Apply topically 2 (two) times daily. (Patient not taking: Reported on 09/24/2024), Disp: 15 g, Rfl: 0   ondansetron  (ZOFRAN -ODT) 4 MG disintegrating tablet, Take 1 tablet (4 mg total) by mouth every 8 (eight) hours as needed for nausea or vomiting. (Patient not taking: Reported on 09/24/2024), Disp: 20 tablet, Rfl: 0  pravastatin  (PRAVACHOL ) 40 MG tablet, Take 1 tablet (40 mg total) by mouth daily. In afternoon (Patient not taking: Reported on 09/24/2024), Disp: 30 tablet, Rfl: 3   spironolactone  (ALDACTONE ) 25 MG tablet, Take 1 tablet (25 mg total) by mouth daily. (Patient not taking: Reported on 09/24/2024), Disp: 90 tablet, Rfl: 3   Tafamidis  Meglumine , Cardiac, 20 MG CAPS, Take 4 capsules (80 mg total) by mouth daily.  (Patient not taking: Reported on 09/24/2024), Disp: 120 capsule, Rfl: 11   TRUE METRIX BLOOD GLUCOSE TEST test strip, , Disp: , Rfl:    TRUEPLUS LANCETS 33G MISC, , Disp: , Rfl:    PHYSICAL EXAM: Vitals:   09/24/24 1332  BP: 131/68  Pulse: (!) 55  SpO2: 100%   General:  Sitting in WC. Unkempt  No resp difficulty HEENT: normal Neck: supple. no JVD.  Cor: Irregular rate & rhythm. No rubs, gallops or murmurs. Lungs: clear Abdomen: soft, nontender, nondistended.Good bowel sounds. Extremities: no cyanosis, clubbing, rash, edema Neuro: alert & orientedx3, cranial nerves grossly intact. moves all 4 extremities w/o difficulty. Affect pleasant   DATA REVIEW  ECG: 07/21/2023: Afib, slow ventricular rate   ECHO: 07/03/23: EF 55-60%, biatrial dilation   CATH: None   ASSESSMENT & PLAN:   Chronic diastolic heart failure: - She has been previously diagnosed with TTR cardiac amyloid. Was on tafamadis but no longer taking most of her meds - PYP scan from 10/24 reviewed personally. Negative for TTR (read as Grade 2).TTR genetics negative - I do not think she has TTR amyloid. She is off tafamadis. Will not restart.  - Volume status ok, continue lcurrent lasix  dose - Off Farxiga  - would not restart with risk of infection - Will also nto restart spiro given noncompliance.  - Keep regimen simple.  - Wrap legs as need   Atrial fibrillation, chronic: - Rate controlled - She is out of her Eliquis . 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  -Would not use SGLT2i with high risk of infections   - Scr 1.17 on 09/13/24 Personally reviewed   Toribio Fuel, MD  2:24 PM

## 2024-09-24 NOTE — Patient Instructions (Signed)
 Medication Changes:  Continue Furosemide  40 mg Twice daily   Continue Potassium 20 mq Daily  RESTART Eliquis  5 mg Twice daily    Special Instructions // Education:  Do the following things EVERYDAY: Weigh yourself in the morning before breakfast. Write it down and keep it in a log. Take your medicines as prescribed Eat low salt foods--Limit salt (sodium) to 2000 mg per day.  Stay as active as you can everyday Limit all fluids for the day to less than 2 liters   Follow-Up in: 3 months   If you have any questions or concerns before your next appointment please send us  a message through [place] or call our office at [phone], If it is after office hours your call will be answered by our answering service and directed appropriately.     At the [facility], you and your health needs are our priority. We have a designated team specialized in the treatment of Heart Failure. This Care Team includes your primary Heart Failure Specialized Cardiologist (physician), Advanced Practice Providers (APPs- Physician Assistants and Nurse Practitioners), and Pharmacist who all work together to provide you with the care you need, when you need it.   You may see any of the following providers on your designated Care Team at your next follow up:  Dr. Toribio Fuel Dr. Ezra Shuck Dr. Ria Commander Dr. Odis Brownie Greig Mosses, NP Caffie Shed, [place] [address] [place], [place] Beckey Coe, NP Jordan Lee, NP Ellouise Class, NP Jaun Bash, PharmD

## 2024-10-05 ENCOUNTER — Emergency Department
Admission: EM | Admit: 2024-10-05 | Discharge: 2024-10-08 | Disposition: E | Attending: Emergency Medicine | Admitting: Emergency Medicine

## 2024-10-05 ENCOUNTER — Encounter: Admission: EM | Disposition: E | Payer: Self-pay | Source: Home / Self Care | Attending: Emergency Medicine

## 2024-10-05 ENCOUNTER — Emergency Department

## 2024-10-05 ENCOUNTER — Emergency Department: Admit: 2024-10-05 | Admitting: Cardiovascular Disease

## 2024-10-05 DIAGNOSIS — I5030 Unspecified diastolic (congestive) heart failure: Secondary | ICD-10-CM | POA: Diagnosis not present

## 2024-10-05 DIAGNOSIS — E1122 Type 2 diabetes mellitus with diabetic chronic kidney disease: Secondary | ICD-10-CM | POA: Insufficient documentation

## 2024-10-05 DIAGNOSIS — I469 Cardiac arrest, cause unspecified: Secondary | ICD-10-CM | POA: Diagnosis present

## 2024-10-05 DIAGNOSIS — I4891 Unspecified atrial fibrillation: Secondary | ICD-10-CM | POA: Insufficient documentation

## 2024-10-05 DIAGNOSIS — Z7901 Long term (current) use of anticoagulants: Secondary | ICD-10-CM | POA: Diagnosis not present

## 2024-10-05 DIAGNOSIS — I213 ST elevation (STEMI) myocardial infarction of unspecified site: Secondary | ICD-10-CM | POA: Diagnosis not present

## 2024-10-05 DIAGNOSIS — R4182 Altered mental status, unspecified: Secondary | ICD-10-CM | POA: Diagnosis not present

## 2024-10-05 DIAGNOSIS — Z8673 Personal history of transient ischemic attack (TIA), and cerebral infarction without residual deficits: Secondary | ICD-10-CM | POA: Insufficient documentation

## 2024-10-05 DIAGNOSIS — I130 Hypertensive heart and chronic kidney disease with heart failure and stage 1 through stage 4 chronic kidney disease, or unspecified chronic kidney disease: Secondary | ICD-10-CM | POA: Insufficient documentation

## 2024-10-05 DIAGNOSIS — N189 Chronic kidney disease, unspecified: Secondary | ICD-10-CM | POA: Insufficient documentation

## 2024-10-05 LAB — COMPREHENSIVE METABOLIC PANEL WITH GFR
ALT: <5 U/L (ref 0–44)
AST: 18 U/L (ref 15–41)
Albumin: 3.3 g/dL — ABNORMAL LOW (ref 3.5–5.0)
Alkaline Phosphatase: 109 U/L (ref 38–126)
Anion gap: 17 — ABNORMAL HIGH (ref 5–15)
BUN: 31 mg/dL — ABNORMAL HIGH (ref 8–23)
CO2: 20 mmol/L — ABNORMAL LOW (ref 22–32)
Calcium: 8.3 mg/dL — ABNORMAL LOW (ref 8.9–10.3)
Chloride: 101 mmol/L (ref 98–111)
Creatinine, Ser: 1.46 mg/dL — ABNORMAL HIGH (ref 0.44–1.00)
GFR, Estimated: 38 mL/min — ABNORMAL LOW (ref >60)
Glucose, Bld: 265 mg/dL — ABNORMAL HIGH (ref 70–99)
Potassium: 3.1 mmol/L — ABNORMAL LOW (ref 3.5–5.1)
Sodium: 138 mmol/L (ref 135–145)
Total Bilirubin: 0.7 mg/dL (ref 0.0–1.2)
Total Protein: 6.1 g/dL — ABNORMAL LOW (ref 6.5–8.1)

## 2024-10-05 LAB — DIFFERENTIAL
Abs Immature Granulocytes: 0.09 K/uL — ABNORMAL HIGH (ref 0.00–0.07)
Basophils Absolute: 0.1 K/uL (ref 0.0–0.1)
Basophils Relative: 0 %
Eosinophils Absolute: 0.2 K/uL (ref 0.0–0.5)
Eosinophils Relative: 1 %
Immature Granulocytes: 1 %
Lymphocytes Relative: 17 %
Lymphs Abs: 1.9 K/uL (ref 0.7–4.0)
Monocytes Absolute: 0.7 K/uL (ref 0.1–1.0)
Monocytes Relative: 6 %
Neutro Abs: 8.5 K/uL — ABNORMAL HIGH (ref 1.7–7.7)
Neutrophils Relative %: 75 %

## 2024-10-05 LAB — CBC
HCT: 41.9 % (ref 36.0–46.0)
Hemoglobin: 13.3 g/dL (ref 12.0–15.0)
MCH: 29.1 pg (ref 26.0–34.0)
MCHC: 31.7 g/dL (ref 30.0–36.0)
MCV: 91.7 fL (ref 80.0–100.0)
Platelets: 112 K/uL — ABNORMAL LOW (ref 150–400)
RBC: 4.57 MIL/uL (ref 3.87–5.11)
RDW: 14.2 % (ref 11.5–15.5)
WBC: 11.5 K/uL — ABNORMAL HIGH (ref 4.0–10.5)
nRBC: 0 % (ref 0.0–0.2)

## 2024-10-05 LAB — PROTIME-INR
INR: 1.1 (ref 0.8–1.2)
Prothrombin Time: 15.1 s (ref 11.4–15.2)

## 2024-10-05 LAB — CBG MONITORING, ED: Glucose-Capillary: 238 mg/dL — ABNORMAL HIGH (ref 70–99)

## 2024-10-05 LAB — APTT: aPTT: 35 s (ref 24–36)

## 2024-10-05 SURGERY — CORONARY/GRAFT ACUTE MI REVASCULARIZATION
Anesthesia: Moderate Sedation

## 2024-10-05 MED ORDER — AMIODARONE HCL 150 MG/3ML IV SOLN
INTRAVENOUS | Status: AC | PRN
  Administered 2024-10-05: 300 mg via INTRAVENOUS

## 2024-10-05 MED ORDER — SODIUM BICARBONATE 8.4 % IV SOLN
INTRAVENOUS | Status: AC | PRN
  Administered 2024-10-05: 50 meq via INTRAVENOUS

## 2024-10-05 MED ORDER — EPINEPHRINE 1 MG/10ML IV SOSY
PREFILLED_SYRINGE | INTRAVENOUS | Status: AC
  Filled 2024-10-05: qty 10

## 2024-10-05 MED ORDER — CALCIUM CHLORIDE 10 % IV SOLN
INTRAVENOUS | Status: AC | PRN
  Administered 2024-10-05: 1 g via INTRAVENOUS

## 2024-10-05 MED ORDER — EPINEPHRINE 1 MG/10ML IV SOSY
PREFILLED_SYRINGE | INTRAVENOUS | Status: AC | PRN
  Administered 2024-10-05 (×10): 1 mg via INTRAVENOUS

## 2024-10-08 NOTE — Code Documentation (Signed)
 Pt intubated at this time. Positive CO2 color change.

## 2024-10-08 NOTE — Code Documentation (Addendum)
Pulse check;  No pulse at this time CPR resumed

## 2024-10-08 NOTE — ED Notes (Addendum)
 Arlander, MD at bedside. Code stroke called at this time. LNK 2:00pm

## 2024-10-08 NOTE — ED Notes (Signed)
 Dr. Michaela in CT at this time

## 2024-10-08 NOTE — ED Triage Notes (Signed)
 Pt to ED via [facility] from home. Family called ems after finding pt on the floor unresponsive. Husband states that wife was sitting in the living room on the couch approx 10 min prior. Hx of afib, Hx of stroke with deficits. Pt has 16 Fr. NPA in place. Last known well 2pm today. A&Ox4 at BL.   117/73 80 HR 90 O2 15L

## 2024-10-08 NOTE — ED Provider Notes (Signed)
 [facility] Emergency Dept Provider Note    Event Date/Time   First MD Initiated Contact with Patient  1502     (approximate)   History   Code Stroke   HPI  Elizabeth [name] is a 73 y.o. female with a history of HFpEF, atrial fibrillation on Eliquis , hypertension, lymphedema, CKD, TIA, diabetes, and diabetic neuropathy who presents with altered mental status and subsequent cardiac arrest.  Per the patient's husband, he left the patient briefly and on his arrival back to the patient, after only a few minutes, he found the patient unresponsive.  I was informed by the other providers that on initial arrival to the ED the patient was moaning in a repetitive manner and possibly had posturing.  Prior to my arrival in the ED, code STEMI was activated based on the EKG sent in by EMS.  The patient had an initial brief assessment at the bedside by Dr. Arlander, consulting with Dr. Darron from cardiology who responded to the code STEMI.  Based on this initial evaluation, code STEMI was cancelled, code stroke was activated, and Dr. Kathren was consulted.  The patient was taken to CT.    Just after I arrived to the ED for my shift, CODE BLUE was called to CT for this patient; my evaluation of the patient began when I responded to this code.  Staff reported that during the CT the patient had stopped moaning and was subsequently found to be without a pulse.  ACLS protocol was initiated.  The patient was unable to provide any history due to her mental status.  I reviewed the past medical records.  The patient's most recent outpatient encounter was with the heart failure clinic on 11/17 for follow-up of her chronic diastolic heart failure.  She reported weakness but no other acute symptoms.  She was out of Eliquis  and was restarted.   Physical Exam   Triage Vital Signs: ED Triage Vitals  Encounter Vitals Group     BP      Girls Systolic BP Percentile      Girls Diastolic BP  Percentile      Boys Systolic BP Percentile      Boys Diastolic BP Percentile      Pulse      Resp      Temp      Temp src      SpO2      Weight      Height      Head Circumference      Peak Flow      Pain Score      Pain Loc      Pain Education      Exclude from Growth Chart     Most recent vital signs: Vitals:    1440  SpO2: 90%     General: Unresponsive. CV:  No palpable pulses. Resp:  Agonal respiration. Abd:  No distention.  Other:  Flaccid with no purposeful movement.  Left eye with no visible pupil.  Right eye with fixed pupil.  Oropharynx clear.   ED Results / Procedures / Treatments   Labs (all labs ordered are listed, but only abnormal results are displayed) Labs Reviewed  CBC - Abnormal; Notable for the following components:      Result Value   WBC 11.5 (*)    Platelets 112 (*)    All other components within normal limits  DIFFERENTIAL - Abnormal; Notable for the following components:   Neutro Abs 8.5 (*)  Abs Immature Granulocytes 0.09 (*)    All other components within normal limits  COMPREHENSIVE METABOLIC PANEL WITH GFR - Abnormal; Notable for the following components:   Potassium 3.1 (*)    CO2 20 (*)    Glucose, Bld 265 (*)    BUN 31 (*)    Creatinine, Ser 1.46 (*)    Calcium  8.3 (*)    Total Protein 6.1 (*)    Albumin 3.3 (*)    GFR, Estimated 38 (*)    Anion gap 17 (*)    All other components within normal limits  CBG MONITORING, ED - Abnormal; Notable for the following components:   Glucose-Capillary 238 (*)    All other components within normal limits  PROTIME-INR  APTT  URINE DRUG SCREEN     EKG  ED ECG REPORT I, Waylon Cassis, the attending physician, personally viewed and interpreted this ECG.  Date: 2024-10-09 EKG Time: 1500 Rate: 41 Rhythm: Sinus bradycardia with frequent PVCs QRS Axis: Left axis Intervals: normal ST/T Wave abnormalities: Nonspecific ST abnormalities Narrative Interpretation:  Nonspecific ST abnormalities, concern for possible STEMI    RADIOLOGY  CT head: I independently viewed and interpreted the images; there is no ICH.  Radiology report indicates the following:  IMPRESSION:  1. No acute intracranial abnormality.  2. ASPECTS 10.  3. 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  4. Remote lacunar infarcts in the right basal ganglia and right thalamus.  5. Chronic microvascular ischemic changes.  6. Findings discussed with Dr. Arlander at 3:18 PM on 2024-10-09.    Electronically signed by: Donnice Mania MD October 09, 2024 03:19 PM EST RP     PROCEDURES:  Critical Care performed: Yes, see critical care procedure note(s)  .Critical Care  Performed by: Cassis Waylon, MD Authorized by: Cassis Waylon, MD   Critical care provider statement:    Critical care time (minutes):  45   Critical care time was exclusive of:  Separately billable procedures and treating other patients   Critical care was necessary to treat or prevent imminent or life-threatening deterioration of the following conditions:  Cardiac failure and CNS failure or compromise   Critical care was time spent personally by me on the following activities:  Development of treatment plan with patient or surrogate, discussions with consultants, evaluation of patient's response to treatment, examination of patient, ordering and review of laboratory studies, ordering and review of radiographic studies, ordering and performing treatments and interventions, pulse oximetry, re-evaluation of patient's condition, review of old charts and obtaining history from patient or surrogate Procedure Name: Intubation Date/Time: 2024-10-09 4:18 PM  Performed by: Cassis Waylon, MDPre-anesthesia Checklist: Patient identified, Emergency Drugs available, Suction available and Patient being monitored Oxygen Delivery Method: Ambu bag Preoxygenation: Pre-oxygenation with 100%  oxygen Laryngoscope Size: Glidescope and 3 Grade View: Grade I Tube size: 7.5 mm Number of attempts: 1 Airway Equipment and Method: Video-laryngoscopy Placement Confirmation: ETT inserted through vocal cords under direct vision, CO2 detector and Breath sounds checked- equal and bilateral Secured at: 23 cm Tube secured with: ETT holder Dental Injury: Teeth and Oropharynx as per pre-operative assessment        MEDICATIONS ORDERED IN ED: Medications  EPINEPHrine  (ADRENALIN ) 1 MG/10ML injection (has no administration in time range)  EPINEPHrine  (ADRENALIN ) 1 MG/10ML injection (1 mg Intravenous Given 2024/10/09 1532)  calcium  chloride injection (1 g Intravenous Given 2024-10-09 1510)  sodium bicarbonate  injection (50 mEq Intravenous Given Oct 09, 2024 1510)  amiodarone  (CORDARONE ) injection (300 mg Intravenous Given 2024-10-09  1517)     IMPRESSION / MDM / ASSESSMENT AND PLAN / ED COURSE  I reviewed the triage vital signs and the nursing notes.  As noted above, when I arrived to the ED the patient was in CT for evaluation of code stroke and had cardiac arrest.  I responded to the CODE BLUE, which began at 15:02.  The patient was unresponsive with agonal respirations.  BVM ventilation had been started by respiratory.  There was no palpable pulse.  CPR had been initiated and 1 dose of epi was given while in CT.  The patient was immediately moved back to a room.  ACLS protocol was continued.  The patient received continuous CPR throughout the resuscitation effort except during pulse and rhythm checks.  During the first pulse/rhythm check, the patient was found to be in ventricular fibrillation and we attempted defibrillation.  Subsequently during the next pulse check I intubated the patient on the first attempt, with no complications.  Ventilation was continued via the ET tube.  ACLS was continued for 35 minutes.  During that time, the patient received a total of 10 doses of epinephrine , and 1 dose  each of amiodarone , sodium bicarbonate , and calcium .  At each pulse check she was found to be in V-fib and received a total of 6 shocks. Axillary temperature was checked and found to be 96.  Blood glucose was found to be 238.  The patient remained unresponsive with no ROSC.  I performed bedside ultrasound and observed that there was no organized LV contraction.  After prolonged resuscitative efforts, I informed the patient's husband and granddaughter of her status and the poor prognosis.  Husband and granddaughter were brought into the room during the resuscitation.  After 35 minutes, with no ROSC, no response from the patient, no organized cardiac activity on ultrasound, I determined that further resuscitative efforts would be futile, and the code was discontinued.  Time of death was called at 15:37 with the patient's family members at her side.  Differential diagnosis includes, but is not limited to, V-fib, acute MI, other acute cardiac dysrhythmia, acute CVA or TIA, seizure, status epilepticus, metabolic disturbance.  Lab workup was not resulted prior to the patient expiring.  Patient's presentation is most consistent with acute presentation with potential threat to life or bodily function.  The patient is on the cardiac monitor to evaluate for evidence of arrhythmia and/or significant heart rate changes.   FINAL CLINICAL IMPRESSION(S) / ED DIAGNOSES   Final diagnoses:  Altered mental status, unspecified altered mental status type  Cardiac arrest [facility])     Rx / DC Orders   ED Discharge Orders     None        Note:  This document was prepared using Dragon voice recognition software and may include unintentional dictation errors.    Jacolyn Pae, MD Oct 08, 2024 1726

## 2024-10-08 NOTE — ED Notes (Signed)
 Called [facility] spoke to [place] and cancelled stemi per dr. Darron and initiated code stroke per dr. arlander

## 2024-10-08 NOTE — Code Documentation (Addendum)
 Pt in Vfib at this time. Siadecki, MD performed ultrasound to observe for possible cardiac activity. Shock administered. No change in rhythm. CPR resumed. Per MD, No more shocks will be administered.

## 2024-10-08 NOTE — Code Documentation (Signed)
Pulse check;  No pulse at this time CPR resumed

## 2024-10-08 NOTE — ED Notes (Signed)
 EMS vitals  117/73 80 HR 90 O2 15L

## 2024-10-08 NOTE — ED Notes (Signed)
 While in CT scanner pt was noted to have a change in respirations and subsequently went apneic and lost pulses. CODE BLUE called in CT

## 2024-10-08 NOTE — Code Documentation (Signed)
 Pt in Vfib at this time with agonal respirations. Shock administered. No change in rhythm. CPR resumed.

## 2024-10-08 NOTE — Code Documentation (Signed)
 Pt in Vfib at this time. Shock administered. No change in rhythm. CPR resumed.

## 2024-10-08 NOTE — Code Documentation (Signed)
 Pulse check. No pulse at this time. Per Jacolyn, MD time of death 23.

## 2024-10-08 NOTE — Code Documentation (Signed)
Pulse check. No pulse present. CPR resumed

## 2024-10-08 NOTE — ED Provider Notes (Signed)
 I called a code STEMI based on the transmitted EKG from EMS.  Dr. Darron met the patient in the room but we felt more than likely CVA given significant AMS. Code stroke activated. Patient very ill appearing but maintaining airway   Elizabeth Charleston, MD  1517

## 2024-10-08 NOTE — Consult Note (Signed)
 Cardiology Consultation   Patient ID: Elizabeth [name] MRN: [id number]; DOB: [birth year]  Admit date:  Date of Consult:   PCP:  [place] HERO, MD   [facility] Providers Cardiologist:  Redell Cave, MD  Electrophysiologist:  OLE ONEIDA HOLTS, MD       Patient Profile: Elizabeth [name] is a 73 y.o. female with a hx of chronic diastolic heart failure, previous stroke and persistent atrial fibrillation who is being seen  for the evaluation of code STEMI at the request of Dr. Arlander.  History of Present Illness: [name] is a 73 year old female with known history of chronic diastolic heart failure, previous stroke and persistent atrial fibrillation.  She is on Eliquis  and clopidogrel  as an outpatient.  She there was previous concern of cardiac amyloidosis but that was not conclusive. She was brought by EMS due to unresponsiveness reported by the husband.  An EKG was performed in the field and was suggestive of inferior STEMI but there was significant motion artifact.  A code STEMI was activated and the patient was brought to the ED.  The patient was not responsive and I could not obtain any history from her.  There was no mention of chest pain by EMS and no evidence of arrhythmia en route.  We tried to repeat an EKG but there was still significant motion artifact.   Past Medical History:  Diagnosis Date   Cancer [facility])    breast   Carcinoma of right breast (HCC) 05/20/2014   Diabetes mellitus without complication (HCC)    Hypertension    Lymphedema    Personal history of radiation therapy 2015   Right breast   PONV (postoperative nausea and vomiting)    Psoriasis 1990   Stroke (HCC) 2000   residual left arm weakness   Varicose veins     Past Surgical History:  Procedure Laterality Date   AMPUTATION Left 03/04/2021   Procedure: AMPUTATION RAY;  Surgeon: Neill Boas, DPM;  Location: [facility] ORS;  Service: Podiatry;  Laterality: Left;    AMPUTATION TOE Right 01/25/2017   Procedure: AMPUTATION TOE   ;  Surgeon: Donnice Cory, DPM;  Location: [facility] ORS;  Service: Podiatry;  Laterality: Right;   BREAST EXCISIONAL BIOPSY Right 2015   [facility] / with radiation   BREAST LUMPECTOMY Right 04-2014   followed by radiation,  no chemo   BREAST REDUCTION SURGERY  1977   CHOLECYSTECTOMY N/A 09/23/2015   Procedure: LAPAROSCOPIC CHOLECYSTECTOMY;  Surgeon: Thom HERO Pin, MD;  Location: [facility] ORS;  Service: General;  Laterality: N/A;   DILATION AND CURETTAGE OF UTERUS  2010   EYE SURGERY Left 2015   cataracts and left eye detached retina repair   EYE SURGERY Right 2013   cataract with len implant   GASTRIC ROUX-EN-Y N/A 09/23/2015   Procedure: LAPAROSCOPIC ROUX-EN-Y GASTRIC;  Surgeon: Thom HERO Pin, MD;  Location: [facility] ORS;  Service: General;  Laterality: N/A;   HIATAL HERNIA REPAIR N/A 09/23/2015   Procedure: LAPAROSCOPIC REPAIR OF HIATAL HERNIA;  Surgeon: Thom HERO Pin, MD;  Location: [facility];  Service: General;  Laterality: N/A;   IRRIGATION AND DEBRIDEMENT FOOT Right 01/25/2017   Procedure: IRRIGATION AND DEBRIDEMENT FOOT;  Surgeon: Donnice Cory, DPM;  Location: [facility] OR[facility];  Service: Podiatry;  Laterality: Right;   IRRIGATION AND DEBRIDEMENT FOOT Right 01/29/2017   Procedure: IRRIGATION AND DEBRIDEMENT FOOT and application of wound vac;  Surgeon: Donnice Cory, DPM;  Location: [facility] OR[facility];  Service: Podiatry;  Laterality: Right;  LOWER EXTREMITY ANGIOGRAPHY Right 01/28/2017   Procedure: Lower Extremity Angiography;  Surgeon: Cordella KANDICE Shawl, MD;  Location: [facility];  Service: Cardiovascular;  Laterality: Right;   LOWER EXTREMITY ANGIOGRAPHY Left 03/06/2021   Procedure: Lower Extremity Angiography;  Surgeon: Shawl Cordella KANDICE, MD;  Location: [facility];  Service: Cardiovascular;  Laterality: Left;   LOWER EXTREMITY INTERVENTION  01/28/2017   Procedure: Lower Extremity Intervention;  Surgeon: Cordella KANDICE Shawl, MD;  Location:  [facility];  Service: Cardiovascular;;   REDUCTION MAMMAPLASTY       Home Medications:  Prior to Admission medications   Medication Sig Start Date End Date Taking? Authorizing Provider  acetaminophen  (TYLENOL ) 325 MG tablet Take 2 tablets (650 mg total) by mouth every 6 (six) hours as needed for mild pain (or Fever >/= 101). 03/07/21  Yes Fausto Sor A, DO  apixaban  (ELIQUIS ) 5 MG TABS tablet Take 1 tablet (5 mg total) by mouth 2 (two) times daily. 09/24/24  Yes Bensimhon, Toribio SAUNDERS, MD  escitalopram  (LEXAPRO ) 10 MG tablet Take 10 mg by mouth daily. 11/30/23  Yes [provider]  furosemide  (LASIX ) 20 MG tablet Take 20 mg by mouth daily. 08/08/24  Yes [provider]  lisinopril  (ZESTRIL ) 10 MG tablet Take 10 mg by mouth daily. 06/11/24  Yes [provider]  midodrine  (PROAMATINE ) 5 MG tablet Take 5 mg by mouth 3 (three) times daily. 06/11/24  Yes [provider]  Bacillus Coagulans-Inulin  (PROBIOTIC) 1-250 BILLION-MG CAPS Take 2 capsules by mouth 3 (three) times daily. Patient not taking: Reported on 09/24/2024 02/13/24   Caleen Qualia, MD  Calcium  Carb-Cholecalciferol  (CALCIUM  1000 + D PO) Take 1 tablet by mouth daily. Patient not taking: Reported on 09/24/2024    [provider]  clopidogrel  (PLAVIX ) 75 MG tablet Take 75 mg by mouth daily. 11/30/23   [provider]  dapagliflozin  propanediol (FARXIGA ) 10 MG TABS tablet Take 1 tablet (10 mg total) by mouth daily. 10/25/23   Sabharwal, Aditya, DO  furosemide  (LASIX ) 40 MG tablet Take 1 tablet (40 mg total) by mouth 2 (two) times daily. 09/24/24   Bensimhon, Toribio SAUNDERS, MD  nystatin  (MYCOSTATIN /NYSTOP ) powder Apply topically 2 (two) times daily. Patient not taking: Reported on 09/24/2024 02/13/24   Caleen Qualia, MD  ondansetron  (ZOFRAN -ODT) 4 MG disintegrating tablet Take 1 tablet (4 mg total) by mouth every 8 (eight) hours as needed for nausea or vomiting. Patient not taking: Reported on  09/24/2024 08/02/24   Nicholaus Rolland BRAVO, MD  potassium chloride  (KLOR-CON ) 10 MEQ tablet Take 2 tablets (20 mEq total) by mouth daily. 09/24/24   Bensimhon, Toribio SAUNDERS, MD  pravastatin  (PRAVACHOL ) 40 MG tablet Take 1 tablet (40 mg total) by mouth daily. In afternoon 10/25/23   Gardenia Led, DO  TRUE METRIX BLOOD GLUCOSE TEST test strip  10/13/18   [provider]  TRUEPLUS LANCETS 33G MISC  10/13/18   [provider]    Scheduled Meds:  EPINEPHrine        Continuous Infusions:  PRN Meds: amiodarone, calcium  chloride, EPINEPHrine , EPINEPHrine , sodium bicarbonate   Allergies:    Allergies  Allergen Reactions   Levaquin [Levofloxacin] Other (See Comments)    Partial seizure    Social History:   Social History   Socioeconomic History   Marital status: Married    Spouse name: Norman Mountain   Number of children: Not on file   Years of education: Not on file   Highest education level: Not on  file  Occupational History   Not on file  Tobacco Use   Smoking status: Never    Passive exposure: Never   Smokeless tobacco: Never  Vaping Use   Vaping status: Never Used  Substance and Sexual Activity   Alcohol use: No    Alcohol/week: 0.0 standard drinks of alcohol   Drug use: No   Sexual activity: Yes    Birth control/protection: Post-menopausal  Other Topics Concern   Not on file  Social History Narrative   Not on file   Social Drivers of Health   Financial Resource Strain: Not on file  Food Insecurity: No Food Insecurity (02/09/2024)   Hunger Vital Sign    Worried About Running Out of Food in the Last Year: Never true    Ran Out of Food in the Last Year: Never true  Transportation Needs: No Transportation Needs (02/09/2024)   PRAPARE - Administrator, Civil Service (Medical): No    Lack of Transportation (Non-Medical): No  Physical Activity: Not on file  Stress: Not on file  Social Connections: Moderately Integrated (02/09/2024)   Social  Connection and Isolation Panel    Frequency of Communication with Friends and Family: Once a week    Frequency of Social Gatherings with Friends and Family: Once a week    Attends Religious Services: 1 to 4 times per year    Active Member of [employer] or Organizations: No    Attends Engineer, Structural: 1 to 4 times per year    Marital Status: Married  Catering Manager Violence: Not At Risk (02/09/2024)   Humiliation, Afraid, Rape, and Kick questionnaire    Fear of Current or Ex-Partner: No    Emotionally Abused: No    Physically Abused: No    Sexually Abused: No    Family History:    Family History  Problem Relation Age of Onset   Diabetes Mother    Cancer Mother    Breast cancer Mother 71   Diabetes Father    Diabetes Sister    Diabetes Brother      ROS:  Please see the history of present illness.  All other ROS reviewed and negative.     Physical Exam/Data: Vitals:    1440  SpO2: 90%   No intake or output data in the 24 hours ending  1620    09/24/2024    1:32 PM 08/01/2024    4:22 PM 06/06/2024   10:00 AM  Last 3 Weights  Weight (lbs) 163 lb 6 oz 171 lb 15.3 oz 172 lb  Weight (kg) 74.106 kg 78 kg 78.019 kg     There is no height or weight on file to calculate BMI.  General:   Ill-appearing and unresponsive HEENT: normal Neck: no JVD Vascular: No carotid bruits; Cardiac:  normal S1, S2; irregularly irregular; no murmur  Lungs:  clear to auscultation bilaterally, no wheezing, rhonchi or rales  Abd: soft, nontender, no hepatomegaly  Ext: no edema   EKG:  The EKG was personally reviewed and demonstrates: Likely underlying atrial fibrillation with significant motion artifact Telemetry:  Telemetry was personally reviewed and demonstrates:    Relevant CV Studies:   Laboratory Data: High Sensitivity Troponin:  No results for input(s): TROPONINIHS in the last 720 hours.   Chemistry Recent Labs  Lab  1448  NA 138  K 3.1*  CL  101  CO2 20*  GLUCOSE 265*  BUN 31*  CREATININE 1.46*  CALCIUM  8.3*  GFRNONAA 38*  ANIONGAP 17*    Recent Labs  Lab  1448  PROT 6.1*  ALBUMIN 3.3*  AST 18  ALT <5  ALKPHOS 109  BILITOT 0.7   Lipids No results for input(s): CHOL, TRIG, HDL, LABVLDL, LDLCALC, CHOLHDL in the last 168 hours.  Hematology Recent Labs  Lab  1448  WBC 11.5*  RBC 4.57  HGB 13.3  HCT 41.9  MCV 91.7  MCH 29.1  MCHC 31.7  RDW 14.2  PLT 112*   Thyroid  No results for input(s): TSH, FREET4 in the last 168 hours.  BNPNo results for input(s): BNP, PROBNP in the last 168 hours.  DDimer No results for input(s): DDIMER in the last 168 hours.  Radiology/Studies:  CT HEAD CODE STROKE WO CONTRAST (LKW 0-4.5h, LVO 0-24h) Result Date:  EXAM: CT HEAD WITHOUT CONTRAST  03:10:45 PM TECHNIQUE: CT of the head was performed without the administration of intravenous contrast. Automated exposure control, iterative reconstruction, and/or weight based adjustment of the mA/kV was utilized to reduce the radiation dose to as low as reasonably achievable. COMPARISON: 10/02/2023 CLINICAL HISTORY: Neuro deficit, acute, stroke suspected. FINDINGS: BRAIN AND VENTRICLES: No acute hemorrhage. No evidence of acute infarct. No hydrocephalus. No extra-axial collection. No mass effect or midline shift. Redemonstrated encephalomalacia in the right frontal lobe, insula, and occipital lobe. Remote lacunar infarcts in the right basal ganglia and right thalamus. Chronic microvascular ischemic changes. Atherosclerotic calcifications in the intracranial carotid and vertebral arteries. ORBITS: Status post right lens replacement. Left phthisis bulbi. SINUSES: No acute abnormality. SOFT TISSUES AND SKULL: No acute soft tissue abnormality. No skull fracture. [place] Stroke Program Early CT (ASPECT) Score: Ganglionic (caudate, IC, lentiform nucleus, insula, M1-M3): 7 Supraganglionic (M4-M6): 3  Total: 10 IMPRESSION: 1. No acute intracranial abnormality. 2. ASPECTS 10. 3. 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 4. Remote lacunar infarcts in the right basal ganglia and right thalamus. 5. Chronic microvascular ischemic changes. 6. Findings discussed with Dr. Arlander at 3:18 PM on . Electronically signed by: Donnice Mania MD  03:19 PM EST RP Workstation: [id number]     Assessment and Plan: Possible inferior STEMI: The patient unfortunately is unresponsive with neurologic findings highly suggestive of an neurologic event most concerning is intracranial bleed given that she is on Eliquis  and clopidogrel .  I discussed with Dr. Arlander and we both agreed that the patient needed a stat CT head to evaluate this and obtain neurologic evaluation.  Recommend repeat EKG when possible and further recommendations to follow after CT head.    For questions or updates, please contact [facility] Please consult [email or web address] for contact info under      Signed, Deatrice Cage, MD   3:20 PM

## 2024-10-08 NOTE — ED Notes (Signed)
 Delay in obtaining EKG due to equipment issues and CODE STROKE being called on pt. Cardiology at bedside stating that pt would not be taken to cath lab emergently due to possible code stroke. Decision made to prioritize head CT at this time

## 2024-10-08 NOTE — Consult Note (Signed)
 I responded to a code stroke, the patient was in CT at the time of my arrival.   Initially, she repeatedly moaned ow and would not answer questions or follow commands.  She did withdraw to noxious stimulation of the right upper extremity and the left upper extremity was held flexed, there was no clonic activity.  Her left eye is opacified but her right eye is midline, though she does not fixate or track.  She did not blink to threat.  Given that she was on apixaban , she was not a candidate for intravenous thrombolytics and we were proceeding to get a CT angiogram when the patient had a change in respirations, went apneic and lost pulses.    CPR was initiated and the ED provider arrived promptly.  Unfortunately, the patient has expired following CPR.  Etiology of the presentation is still slightly unclear, she did have changes which could be consistent with cardiac etiology, but her mental status was affected more than would be typical from a cardiac standpoint.  Given her previous large stroke, I still think it is possible this is primarily cardiac without side effect due to her poor cerebral protoplasm, but with her history of seizures, I think that seizure would also be a possibility even though there was no clear evidence of it on exam.  In any case, this is all supposition at this point.   This patient is critically ill and at significant risk of neurological worsening, death and care requires constant monitoring of vital signs, hemodynamics,respiratory and cardiac monitoring, neurological assessment, discussion with family, other specialists and medical decision making of high complexity. I spent 35 minutes of neurocritical care time  in the care of  this patient. This was time spent independent of any time provided by nurse practitioner or PA.  Aisha Seals, MD [facility]   If 7pm- 7am, please page neurology on call as listed in AMION.   3:58 PM

## 2024-10-08 NOTE — ED Notes (Signed)
PT arrived to CT

## 2024-10-08 NOTE — Code Documentation (Signed)
 Family at bedside. Pulse check. No pulse at this time. CPR resumed.

## 2024-10-08 NOTE — Progress Notes (Signed)
    1430  Spiritual Encounters  Type of Visit Initial  Care provided to: Family  Conversation partners present during encounter Nurse;Physician  Referral source Code page  Reason for visit Code  OnCall Visit Yes  Interventions  Spiritual Care Interventions Made Established relationship of care and support;Compassionate presence;Reflective listening;Normalization of emotions;Narrative/life review;Supported grief process Journalist, Newspaper supported family upon death of pt)  Intervention Outcomes  Outcomes Connection to spiritual care;Awareness of support  Spiritual Care Plan  Spiritual Care Issues Still Outstanding No further spiritual care needs at this time (see row info)

## 2024-10-08 NOTE — Code Documentation (Signed)
 Pt returned to room from CT at this time. Ongoing compressions during transport. Pulse check. No pulse present CPR resumed.

## 2024-10-08 DEATH — deceased

## 2024-10-22 ENCOUNTER — Ambulatory Visit (INDEPENDENT_AMBULATORY_CARE_PROVIDER_SITE_OTHER): Admitting: Vascular Surgery

## 2024-12-07 ENCOUNTER — Ambulatory Visit: Admitting: Cardiology

## 2024-12-17 ENCOUNTER — Ambulatory Visit: Admitting: Family
